# Patient Record
Sex: Male | Born: 1945 | Race: Black or African American | Hispanic: No | Marital: Married | Smoking: Former smoker
Health system: Southern US, Community
[De-identification: ages and names within clinical notes are randomized; demographics above are authoritative.]

## PROBLEM LIST (undated history)

## (undated) DIAGNOSIS — I428 Other cardiomyopathies: Secondary | ICD-10-CM

## (undated) DIAGNOSIS — K219 Gastro-esophageal reflux disease without esophagitis: Secondary | ICD-10-CM

## (undated) DIAGNOSIS — I10 Essential (primary) hypertension: Secondary | ICD-10-CM

## (undated) DIAGNOSIS — N186 End stage renal disease: Secondary | ICD-10-CM

## (undated) DIAGNOSIS — Z86718 Personal history of other venous thrombosis and embolism: Secondary | ICD-10-CM

## (undated) DIAGNOSIS — E785 Hyperlipidemia, unspecified: Secondary | ICD-10-CM

## (undated) DIAGNOSIS — Z9289 Personal history of other medical treatment: Secondary | ICD-10-CM

## (undated) DIAGNOSIS — I509 Heart failure, unspecified: Secondary | ICD-10-CM

## (undated) DIAGNOSIS — J45909 Unspecified asthma, uncomplicated: Secondary | ICD-10-CM

## (undated) DIAGNOSIS — C679 Malignant neoplasm of bladder, unspecified: Secondary | ICD-10-CM

## (undated) DIAGNOSIS — E119 Type 2 diabetes mellitus without complications: Secondary | ICD-10-CM

## (undated) DIAGNOSIS — Z9889 Other specified postprocedural states: Secondary | ICD-10-CM

## (undated) DIAGNOSIS — M1990 Unspecified osteoarthritis, unspecified site: Secondary | ICD-10-CM

## (undated) HISTORY — DX: Other cardiomyopathies: I42.8

## (undated) HISTORY — PX: JOINT REPLACEMENT: SHX530

## (undated) HISTORY — PX: BACK SURGERY: SHX140

## (undated) HISTORY — DX: Personal history of other medical treatment: Z92.89

## (undated) HISTORY — PX: OTHER SURGICAL HISTORY: SHX169

## (undated) HISTORY — DX: Other specified postprocedural states: Z98.890

## (undated) HISTORY — DX: Type 2 diabetes mellitus without complications: E11.9

## (undated) HISTORY — DX: End stage renal disease: N18.6

## (undated) HISTORY — DX: Hyperlipidemia, unspecified: E78.5

## (undated) HISTORY — DX: Essential (primary) hypertension: I10

## (undated) HISTORY — PX: CYSTOSCOPY: SUR368

## (undated) HISTORY — DX: Personal history of other venous thrombosis and embolism: Z86.718

## (undated) HISTORY — DX: Unspecified asthma, uncomplicated: J45.909

---

## 1998-01-22 ENCOUNTER — Other Ambulatory Visit: Admission: RE | Admit: 1998-01-22 | Discharge: 1998-01-22 | Payer: Self-pay | Admitting: Sports Medicine

## 1998-01-23 ENCOUNTER — Ambulatory Visit (HOSPITAL_COMMUNITY): Admission: RE | Admit: 1998-01-23 | Discharge: 1998-01-23 | Payer: Self-pay | Admitting: Sports Medicine

## 1998-02-13 ENCOUNTER — Ambulatory Visit (HOSPITAL_BASED_OUTPATIENT_CLINIC_OR_DEPARTMENT_OTHER): Admission: RE | Admit: 1998-02-13 | Discharge: 1998-02-13 | Payer: Self-pay | Admitting: Urology

## 1998-02-13 ENCOUNTER — Emergency Department (HOSPITAL_COMMUNITY): Admission: EM | Admit: 1998-02-13 | Discharge: 1998-02-13 | Payer: Self-pay | Admitting: Emergency Medicine

## 2001-04-28 ENCOUNTER — Encounter: Payer: Self-pay | Admitting: Cardiology

## 2001-04-28 ENCOUNTER — Ambulatory Visit (HOSPITAL_COMMUNITY): Admission: RE | Admit: 2001-04-28 | Discharge: 2001-04-28 | Payer: Self-pay | Admitting: Cardiology

## 2004-08-27 ENCOUNTER — Ambulatory Visit (HOSPITAL_COMMUNITY): Admission: RE | Admit: 2004-08-27 | Discharge: 2004-08-27 | Payer: Self-pay | Admitting: Internal Medicine

## 2004-11-19 ENCOUNTER — Ambulatory Visit (HOSPITAL_COMMUNITY): Admission: RE | Admit: 2004-11-19 | Discharge: 2004-11-19 | Payer: Self-pay | Admitting: Internal Medicine

## 2006-04-16 ENCOUNTER — Ambulatory Visit: Payer: Self-pay | Admitting: Oncology

## 2006-05-03 LAB — CBC & DIFF AND RETIC
Basophils Absolute: 0 10*3/uL (ref 0.0–0.1)
Eosinophils Absolute: 0.1 10*3/uL (ref 0.0–0.5)
HGB: 11.3 g/dL — ABNORMAL LOW (ref 13.0–17.1)
IRF: 0.38 (ref 0.070–0.380)
MCV: 91.4 fL (ref 81.6–98.0)
MONO#: 0.6 10*3/uL (ref 0.1–0.9)
NEUT#: 3.8 10*3/uL (ref 1.5–6.5)
RDW: 15.7 % — ABNORMAL HIGH (ref 11.2–14.6)
RETIC #: 48.5 10*3/uL (ref 31.8–103.9)
Retic %: 1.3 % (ref 0.7–2.3)
WBC: 5.6 10*3/uL (ref 4.0–10.0)
lymph#: 1 10*3/uL (ref 0.9–3.3)

## 2006-05-03 LAB — CHCC SMEAR

## 2006-05-04 LAB — COMPREHENSIVE METABOLIC PANEL
AST: 20 U/L (ref 0–37)
Albumin: 3.7 g/dL (ref 3.5–5.2)
Alkaline Phosphatase: 48 U/L (ref 39–117)
Potassium: 4.7 mEq/L (ref 3.5–5.3)
Sodium: 140 mEq/L (ref 135–145)
Total Bilirubin: 0.3 mg/dL (ref 0.3–1.2)
Total Protein: 6.2 g/dL (ref 6.0–8.3)

## 2006-05-04 LAB — IRON AND TIBC
%SAT: 24 % (ref 20–55)
Iron: 73 ug/dL (ref 42–165)
TIBC: 299 ug/dL (ref 215–435)

## 2006-05-04 LAB — FERRITIN: Ferritin: 199 ng/mL (ref 22–322)

## 2006-06-29 ENCOUNTER — Ambulatory Visit: Payer: Self-pay | Admitting: Oncology

## 2006-07-01 LAB — CBC WITH DIFFERENTIAL/PLATELET
Basophils Absolute: 0 10*3/uL (ref 0.0–0.1)
EOS%: 3.8 % (ref 0.0–7.0)
HCT: 34.4 % — ABNORMAL LOW (ref 38.7–49.9)
HGB: 11.3 g/dL — ABNORMAL LOW (ref 13.0–17.1)
LYMPH%: 24.3 % (ref 14.0–48.0)
MCH: 30.1 pg (ref 28.0–33.4)
MCV: 91.3 fL (ref 81.6–98.0)
MONO%: 11.4 % (ref 0.0–13.0)
NEUT%: 60 % (ref 40.0–75.0)
Platelets: 255 10*3/uL (ref 145–400)
lymph#: 1.3 10*3/uL (ref 0.9–3.3)

## 2006-07-01 LAB — COMPREHENSIVE METABOLIC PANEL
Albumin: 3.8 g/dL (ref 3.5–5.2)
Alkaline Phosphatase: 45 U/L (ref 39–117)
BUN: 34 mg/dL — ABNORMAL HIGH (ref 6–23)
CO2: 24 mEq/L (ref 19–32)
Calcium: 8.7 mg/dL (ref 8.4–10.5)
Glucose, Bld: 151 mg/dL — ABNORMAL HIGH (ref 70–99)
Potassium: 4.4 mEq/L (ref 3.5–5.3)

## 2006-12-27 ENCOUNTER — Ambulatory Visit: Payer: Self-pay | Admitting: Oncology

## 2006-12-30 LAB — CBC WITH DIFFERENTIAL/PLATELET
BASO%: 0.5 % (ref 0.0–2.0)
Eosinophils Absolute: 0.3 10*3/uL (ref 0.0–0.5)
HCT: 33.7 % — ABNORMAL LOW (ref 38.7–49.9)
LYMPH%: 23.4 % (ref 14.0–48.0)
MCHC: 34 g/dL (ref 32.0–35.9)
MCV: 89.8 fL (ref 81.6–98.0)
MONO#: 0.6 10*3/uL (ref 0.1–0.9)
MONO%: 11.3 % (ref 0.0–13.0)
NEUT%: 59.7 % (ref 40.0–75.0)
Platelets: 248 10*3/uL (ref 145–400)
RBC: 3.75 10*6/uL — ABNORMAL LOW (ref 4.20–5.71)
WBC: 5.7 10*3/uL (ref 4.0–10.0)

## 2006-12-30 LAB — COMPREHENSIVE METABOLIC PANEL
Alkaline Phosphatase: 63 U/L (ref 39–117)
CO2: 24 mEq/L (ref 19–32)
Creatinine, Ser: 1.98 mg/dL — ABNORMAL HIGH (ref 0.40–1.50)
Glucose, Bld: 263 mg/dL — ABNORMAL HIGH (ref 70–99)
Total Bilirubin: 0.3 mg/dL (ref 0.3–1.2)

## 2007-09-30 ENCOUNTER — Encounter: Admission: RE | Admit: 2007-09-30 | Discharge: 2007-09-30 | Payer: Self-pay | Admitting: Nephrology

## 2008-05-28 ENCOUNTER — Encounter: Admission: RE | Admit: 2008-05-28 | Discharge: 2008-05-28 | Payer: Self-pay | Admitting: Internal Medicine

## 2009-04-10 ENCOUNTER — Encounter: Admission: RE | Admit: 2009-04-10 | Discharge: 2009-04-10 | Payer: Self-pay | Admitting: Sports Medicine

## 2009-05-16 ENCOUNTER — Ambulatory Visit (HOSPITAL_BASED_OUTPATIENT_CLINIC_OR_DEPARTMENT_OTHER): Admission: RE | Admit: 2009-05-16 | Discharge: 2009-05-16 | Payer: Self-pay | Admitting: Orthopedic Surgery

## 2010-03-13 ENCOUNTER — Encounter: Admission: RE | Admit: 2010-03-13 | Discharge: 2010-03-13 | Payer: Self-pay | Admitting: Rheumatology

## 2010-04-22 ENCOUNTER — Encounter: Payer: Self-pay | Admitting: Cardiology

## 2010-05-27 ENCOUNTER — Ambulatory Visit: Payer: Self-pay | Admitting: Cardiology

## 2010-05-28 ENCOUNTER — Encounter: Payer: Self-pay | Admitting: Internal Medicine

## 2010-05-28 ENCOUNTER — Telehealth (INDEPENDENT_AMBULATORY_CARE_PROVIDER_SITE_OTHER): Payer: Self-pay | Admitting: *Deleted

## 2010-05-29 ENCOUNTER — Encounter (HOSPITAL_COMMUNITY): Admission: RE | Admit: 2010-05-29 | Discharge: 2010-07-08 | Payer: Self-pay | Admitting: Cardiology

## 2010-05-29 ENCOUNTER — Ambulatory Visit: Payer: Self-pay

## 2010-05-29 ENCOUNTER — Ambulatory Visit: Payer: Self-pay | Admitting: Internal Medicine

## 2010-05-30 ENCOUNTER — Telehealth (INDEPENDENT_AMBULATORY_CARE_PROVIDER_SITE_OTHER): Payer: Self-pay | Admitting: *Deleted

## 2010-06-04 ENCOUNTER — Inpatient Hospital Stay (HOSPITAL_COMMUNITY): Admission: RE | Admit: 2010-06-04 | Discharge: 2010-06-07 | Payer: Self-pay | Admitting: Orthopedic Surgery

## 2010-07-15 ENCOUNTER — Encounter: Admission: RE | Admit: 2010-07-15 | Discharge: 2010-07-15 | Payer: Self-pay | Admitting: Internal Medicine

## 2010-07-24 ENCOUNTER — Encounter: Admission: RE | Admit: 2010-07-24 | Discharge: 2010-07-24 | Payer: Self-pay | Admitting: Internal Medicine

## 2010-07-29 ENCOUNTER — Encounter (HOSPITAL_COMMUNITY)
Admission: RE | Admit: 2010-07-29 | Discharge: 2010-10-14 | Payer: Self-pay | Source: Home / Self Care | Attending: Nephrology | Admitting: Nephrology

## 2010-08-04 ENCOUNTER — Ambulatory Visit: Payer: Self-pay

## 2010-08-04 ENCOUNTER — Ambulatory Visit: Payer: Self-pay | Admitting: Internal Medicine

## 2010-08-04 ENCOUNTER — Encounter (INDEPENDENT_AMBULATORY_CARE_PROVIDER_SITE_OTHER): Payer: Self-pay | Admitting: Internal Medicine

## 2010-08-04 ENCOUNTER — Ambulatory Visit (HOSPITAL_COMMUNITY): Admission: RE | Admit: 2010-08-04 | Discharge: 2010-08-04 | Payer: Self-pay | Admitting: Internal Medicine

## 2010-08-27 ENCOUNTER — Telehealth (INDEPENDENT_AMBULATORY_CARE_PROVIDER_SITE_OTHER): Payer: Self-pay | Admitting: *Deleted

## 2010-10-15 ENCOUNTER — Encounter (HOSPITAL_COMMUNITY)
Admission: RE | Admit: 2010-10-15 | Discharge: 2010-11-11 | Payer: Self-pay | Source: Home / Self Care | Attending: Nephrology | Admitting: Nephrology

## 2010-10-29 LAB — POCT HEMOGLOBIN-HEMACUE: Hemoglobin: 11.1 g/dL — ABNORMAL LOW (ref 13.0–17.0)

## 2010-11-03 ENCOUNTER — Encounter: Payer: Self-pay | Admitting: Sports Medicine

## 2010-11-11 LAB — POCT HEMOGLOBIN-HEMACUE: Hemoglobin: 11.3 g/dL — ABNORMAL LOW (ref 13.0–17.0)

## 2010-11-11 NOTE — Progress Notes (Signed)
Summary: Nuc Pre-Procedure  Phone Note Outgoing Call Call back at [facility] Phone [phone]   Call placed by: Antionette Char RN,  May 28, 2010 3:56 PM Call placed to: Patient Reason for Call: Confirm/change Appt Summary of Call: Reviewed information on Myoview Information Sheet (see scanned document for further details).  Spoke with patient.

## 2010-11-11 NOTE — Assessment & Plan Note (Signed)
Summary: np6/surgical clearance/total knee & hip surgery   Primary Provider:  Dr. Nehemiah Settle  CC:  no complaints.....Marland Kitchensurgical clearance.  History of Present Illness: 65 yo with history of DM2, HTN, CKD presents for preoperative cardiac evaluation.  He needs to undergo left THR.  He has had chronic left hip and back pain requiring the use of narcotics.  He has been walking with a crutch because of the hip pain, which has been present since Christmas.  His activity level is very impaired by the pain. He denies exertional shortness of breath or chest pain but exertion has been minimal for the last few months.  BP is under good control.    ECG: NSR with PVCs  Labs (7/11): K 4.3, creatinine 2.28, LDL 118, HDL 33, LFTs normal  Current Medications (verified): 1)  Amlodipine Besy-Benazepril Hcl 10-20 Mg Caps (Amlodipine Besy-Benazepril Hcl) .Marland Kitchen.. 1 Tab By Mouth Once Daily 2)  Indapamide 1.25 Mg Tabs (Indapamide) .Marland Kitchen.. 1 Tab By Mouth Once Daily 3)  Doxazosin Mesylate 2 Mg Tabs (Doxazosin Mesylate) .Marland Kitchen.. 1 Tab By Mouth Once Daily 4)  Glipizide 10 Mg Tabs (Glipizide) .Marland Kitchen.. 1 Tab By Mouth Once Daily 5)  Avandia 8 Mg Tabs (Rosiglitazone Maleate) .Marland Kitchen.. 1 Tab By Mouth Once Daily 6)  Januvia 50 Mg Tabs (Sitagliptin Phosphate) .Marland Kitchen.. 1  Tab By Mouth Once Daily 7)  Allopurinol 300 Mg Tabs (Allopurinol) .... 1/2 Tab By Mouth Once Daily 8)  Furosemide 40 Mg Tabs (Furosemide) .... Take One Tablet By Mouth Daily. 9)  Oxycodone-Acetaminophen 5-325 Mg Tabs (Oxycodone-Acetaminophen) .... As Needed 10)  Aspirin 81 Mg Tbec (Aspirin) .... Take One Tablet By Mouth Daily 11)  Ferrous Sulfate 325 (65 Fe) Mg  Tabs (Ferrous Sulfate) .Marland Kitchen.. 1 Tab By Mouth Once Daily 12)  Red Yeast Rice 600 Mg Caps (Red Yeast Rice Extract) .Marland Kitchen.. 1  Tab By Mouth Once Daily  Past History:  Past Medical History: 1.  CAD: LHC 2002 showed mild nonobstructive disease with 25% stenosis in LAD.  EF 55%.   2.  DM2 3.  Obesity 4.  CKD 5.  HTN 6.   Gout 7.  Low back pain 8.  Osteoarthritis of the hips  Family History: Father died from "heart complications" at 44  Social History: Nonsmoker (quit 1998).  Retired Production designer, theatre/television/film at [employer].  Lives with wife and daughter in [place].   Review of Systems       All systems reviewed and negative except as per HPI.   Vital Signs:  Patient profile:   65 year old male Height:      70 inches Weight:      274 pounds BMI:     39.46 Pulse rate:   85 / minute Resp:     14 per minute BP sitting:   127 / 76  (left arm)  Vitals Entered By: Kem Parkinson (May 27, 2010 9:58 AM)  Physical Exam  General:  Well developed, well nourished, in no acute distress. Obese.  Head:  normocephalic and atraumatic Nose:  no deformity, discharge, inflammation, or lesions Mouth:  Teeth, gums and palate normal. Oral mucosa normal. Neck:  Neck supple, no JVD. No masses, thyromegaly or abnormal cervical nodes. Lungs:  Clear bilaterally to auscultation and percussion. Heart:  Non-displaced PMI, chest non-tender; regular rate and rhythm, S1, S2 without murmurs, rubs or gallops. Carotid upstroke normal, no bruit. Pedals normal pulses. No edema, no varicosities. Abdomen:  Bowel sounds positive; abdomen soft and non-tender without masses, organomegaly, or hernias noted.  No hepatosplenomegaly. Msk:  Antalgic gait Extremities:  No clubbing or cyanosis. Neurologic:  Alert and oriented x 3. Skin:  Intact without lesions or rashes. Psych:  Normal affect.   Impression & Recommendations:  Problem # 1:  PREOPERATIVE CARDIAC EVALUATION Patient has a number of risk factors for CAD (HTN, DM, CKD), and he had mild disease on cath in 2002.  Activity is very limited by hip pain (able to perform < 4 METS).  Given his activity limitation, I will arrange for him to get a Steffanie Dunn for risk stratification prior to surgery.  If he has evidence for obstructive coronary disease by myoview, I would suggest beginning a  beta blocker.   Problem # 2:  HYPERLIPIDEMIA-MIXED (ICD-272.4) Given diabetes, I would aim for LDL at least < 100.  He recently started red yeast rice extract, so need lipids/LFTs in a couple of months.    Other Orders: Nuclear Stress Test (Nuc Stress Test)  Patient Instructions: 1)  Your physician has requested that you have an lexiscan  myoview.  For further information please visit [email or web address]  Please follow instruction sheet, as given. PT REQUESTS THIS THURSDAY  2)  Your physician recommends that you schedule a follow-up appointment as needed with Dr Shirlee Latch.

## 2010-11-11 NOTE — Progress Notes (Signed)
  Stress faxed to [facility] [phone] [facility]  May 30, 2010 9:18 AM

## 2010-11-11 NOTE — Assessment & Plan Note (Signed)
Summary: Cardiology Nuclear Testing  Nuclear Med Background Indications for Stress Test: Evaluation for Ischemia, Surgical Clearance  Indications Comments: Pending (L)THR on 06/04/10 by Dr. Ollen Gross  History: Asthma, Heart Catheterization, Myocardial Perfusion Study  History Comments: '02 MPS: Inferior Ischemic, EF 52%; '02 Cath:LAD 25%, EF 55%; h/o DVT  Symptoms: Rapid HR  Symptoms Comments: History PVC's   Nuclear Pre-Procedure Cardiac Risk Factors: Family History - CAD, History of Smoking, Hypertension, Lipids, NIDDM, Obesity Caffeine/Decaff Intake: None NPO After: 6:00 AM Lungs: Clear.  O2 Sat 95% on RA. IV 0.9% NS with Angio Cath: 22g     IV Site: Rt Hand IV Started by: Bonnita Levan RN Chest Size (in) 52     Height (in): 70 Weight (lb): 272 BMI: 39.17  Nuclear Med Study 1 or 2 day study:  1 day     Stress Test Type:  Eugenie Birks Reading MD:  Arvilla Meres, MD     Referring MD:  Marca Ancona, MD Resting Radionuclide:  Technetium 62m Tetrofosmin     Resting Radionuclide Dose:  11 mCi  Stress Radionuclide:  Technetium 78m Tetrofosmin     Stress Radionuclide Dose:  33 mCi   Stress Protocol   Lexiscan: 0.4 mg   Stress Test Technologist:  Rea College CMA-N     Nuclear Technologist:  Domenic Polite CNMT  Rest Procedure  Myocardial perfusion imaging was performed at rest 45 minutes following the intravenous administration of Myoview Technetium 49m Tetrofosmin.  Stress Procedure  The patient received IV Lexiscan 0.4 mg over 15-seconds.  Myoview injected at 30-seconds.  There were no significant changes with infusion, only occasional PVC's and rare PAC's.  Quantitative spect images were obtained after a 45 minute delay.  QPS Raw Data Images:  Normal; no motion artifact; normal heart/lung ratio. Stress Images:  Decreased uptake in the infero-apical wall.  Rest Images:  Decreased uptake in the infero-apical wall.  Subtraction (SDS):  Mild fixed infero-apical defect.    Transient Ischemic Dilatation:  1.09  (Normal <1.22)  Lung/Heart Ratio:  .34  (Normal <0.45)  Quantitative Gated Spect Images QGS EDV:  184 ml QGS ESV:  97 ml QGS EF:  47 % QGS cine images:  Mild apical hypokinesis.   Findings Low risk nuclear study      Overall Impression  Exercise Capacity: Lexiscan study with no exercise. ECG Impression: Baseline: NSR; No significant ST segment change with Lexiscan. Overall Impression: Low risk stress nuclear study. Overall Impression Comments: Very small fixed infero-apical defect consistent with apical thinning vs. previous infarction. No ischemia.  Appended Document: Cardiology Nuclear Testing Fixed inferoapical defect, no ischemia.  May be similar to 2002 study when cath showed minimal disease.  OK for surgery.  EF mildly decreased.  Would confirm with echo.    Appended Document: Cardiology Nuclear Testing Cannot rule out CAD (possible fixed defect but no further workup needed before surgery.  Would start low dose beta blocker, can use Coreg 6.25 mg two times a day for cardioprotection.   Appended Document: Cardiology Nuclear Testing 06/02/10--12noon--called pt to let him know about clearance and starting coreg--LMTCB--nt  Appended Document: Cardiology Nuclear Testing 06/02/10--130pm--spoke with pt and gave results stress test, also told pt to start coreg 6.25mg  two times a day --will fax surgical clearance to dr alusio--surgery sched for wednesday 8/24--nt

## 2010-11-11 NOTE — Progress Notes (Signed)
Summary: Records Request  Faxed Echo to [facility] at [facility] ([phone]). Debby Freiberg  August 27, 2010 6:03 PM

## 2010-11-11 NOTE — Letter (Signed)
Summary: [facility] Physicians Office Visit Note   [facility] Physicians Office Visit Note   Imported By: Roderic Ovens 05/30/2010 10:52:56  _____________________________________________________________________  External Attachment:    Type:   Image     Comment:   External Document

## 2010-11-25 ENCOUNTER — Other Ambulatory Visit: Payer: Self-pay | Admitting: Nephrology

## 2010-11-25 ENCOUNTER — Encounter (HOSPITAL_COMMUNITY)
Admission: RE | Admit: 2010-11-25 | Discharge: 2010-11-25 | Disposition: A | Discharge status: Home / Self Care | Payer: Medicare Other | Source: Ambulatory Visit | Attending: Nephrology | Admitting: Nephrology

## 2010-11-25 ENCOUNTER — Other Ambulatory Visit: Payer: Self-pay

## 2010-11-25 DIAGNOSIS — D638 Anemia in other chronic diseases classified elsewhere: Secondary | ICD-10-CM | POA: Insufficient documentation

## 2010-11-25 DIAGNOSIS — N183 Chronic kidney disease, stage 3 unspecified: Secondary | ICD-10-CM | POA: Insufficient documentation

## 2010-11-25 LAB — IRON AND TIBC
Iron: 138 ug/dL — ABNORMAL HIGH (ref 42–135)
Saturation Ratios: 50 % (ref 20–55)
TIBC: 278 ug/dL (ref 215–435)
UIBC: 140 ug/dL

## 2010-11-25 LAB — POCT HEMOGLOBIN-HEMACUE: Hemoglobin: 11.2 g/dL — ABNORMAL LOW (ref 13.0–17.0)

## 2010-12-09 ENCOUNTER — Encounter (HOSPITAL_COMMUNITY): Payer: Medicare Other

## 2010-12-09 ENCOUNTER — Other Ambulatory Visit: Payer: Self-pay

## 2010-12-09 ENCOUNTER — Other Ambulatory Visit: Payer: Self-pay | Admitting: Nephrology

## 2010-12-09 LAB — PROTEIN / CREATININE RATIO, URINE
Creatinine, Urine: 57.3 mg/dL
Protein Creatinine Ratio: 0.56 — ABNORMAL HIGH (ref 0.00–0.15)
Total Protein, Urine: 32 mg/dL

## 2010-12-09 LAB — RENAL FUNCTION PANEL
Albumin: 3.5 g/dL (ref 3.5–5.2)
BUN: 41 mg/dL — ABNORMAL HIGH (ref 6–23)
CO2: 28 mEq/L (ref 19–32)
Calcium: 8.8 mg/dL (ref 8.4–10.5)
Chloride: 106 mEq/L (ref 96–112)
Creatinine, Ser: 1.75 mg/dL — ABNORMAL HIGH (ref 0.4–1.5)
GFR calc Af Amer: 48 mL/min — ABNORMAL LOW (ref >60)
GFR calc non Af Amer: 39 mL/min — ABNORMAL LOW (ref >60)
Glucose, Bld: 189 mg/dL — ABNORMAL HIGH (ref 70–99)
Phosphorus: 3.8 mg/dL (ref 2.3–4.6)
Potassium: 4.4 mEq/L (ref 3.5–5.1)
Sodium: 142 mEq/L (ref 135–145)

## 2010-12-10 LAB — PTH, INTACT AND CALCIUM
Calcium, Total (PTH): 8.6 mg/dL (ref 8.4–10.5)
PTH: 226.9 pg/mL — ABNORMAL HIGH (ref 14.0–72.0)

## 2010-12-10 LAB — POCT HEMOGLOBIN-HEMACUE: Hemoglobin: 11.8 g/dL — ABNORMAL LOW (ref 13.0–17.0)

## 2010-12-22 LAB — POCT HEMOGLOBIN-HEMACUE: Hemoglobin: 10.2 g/dL — ABNORMAL LOW (ref 13.0–17.0)

## 2010-12-23 ENCOUNTER — Encounter (HOSPITAL_COMMUNITY): Payer: BC Managed Care – PPO

## 2010-12-25 ENCOUNTER — Other Ambulatory Visit: Payer: Self-pay | Admitting: Nephrology

## 2010-12-25 ENCOUNTER — Other Ambulatory Visit: Payer: Self-pay

## 2010-12-25 ENCOUNTER — Encounter (HOSPITAL_COMMUNITY): Payer: Medicare Other | Attending: Nephrology

## 2010-12-25 DIAGNOSIS — D638 Anemia in other chronic diseases classified elsewhere: Secondary | ICD-10-CM | POA: Insufficient documentation

## 2010-12-25 DIAGNOSIS — N183 Chronic kidney disease, stage 3 unspecified: Secondary | ICD-10-CM | POA: Insufficient documentation

## 2010-12-25 LAB — IRON AND TIBC
Iron: 134 ug/dL (ref 42–135)
Saturation Ratios: 49 % (ref 20–55)
TIBC: 275 ug/dL (ref 215–435)
UIBC: 141 ug/dL

## 2010-12-26 LAB — CBC
HCT: 30 % — ABNORMAL LOW (ref 39.0–52.0)
Hemoglobin: 8.5 g/dL — ABNORMAL LOW (ref 13.0–17.0)
Hemoglobin: 9.9 g/dL — ABNORMAL LOW (ref 13.0–17.0)
MCH: 30 pg (ref 26.0–34.0)
MCH: 30.2 pg (ref 26.0–34.0)
MCH: 30.2 pg (ref 26.0–34.0)
MCH: 30.5 pg (ref 26.0–34.0)
MCHC: 32.9 g/dL (ref 30.0–36.0)
MCHC: 33 g/dL (ref 30.0–36.0)
MCHC: 33.1 g/dL (ref 30.0–36.0)
MCHC: 33.1 g/dL (ref 30.0–36.0)
MCV: 90.7 fL (ref 78.0–100.0)
MCV: 91.4 fL (ref 78.0–100.0)
MCV: 91.8 fL (ref 78.0–100.0)
Platelets: 153 10*3/uL (ref 150–400)
Platelets: 155 10*3/uL (ref 150–400)
Platelets: 156 10*3/uL (ref 150–400)
Platelets: 221 10*3/uL (ref 150–400)
RBC: 2.82 MIL/uL — ABNORMAL LOW (ref 4.22–5.81)
RBC: 2.98 MIL/uL — ABNORMAL LOW (ref 4.22–5.81)
RBC: 3.3 MIL/uL — ABNORMAL LOW (ref 4.22–5.81)
RDW: 15.9 % — ABNORMAL HIGH (ref 11.5–15.5)
RDW: 16.3 % — ABNORMAL HIGH (ref 11.5–15.5)
WBC: 4.7 10*3/uL (ref 4.0–10.5)

## 2010-12-26 LAB — GLUCOSE, CAPILLARY
Glucose-Capillary: 102 mg/dL — ABNORMAL HIGH (ref 70–99)
Glucose-Capillary: 113 mg/dL — ABNORMAL HIGH (ref 70–99)
Glucose-Capillary: 115 mg/dL — ABNORMAL HIGH (ref 70–99)
Glucose-Capillary: 115 mg/dL — ABNORMAL HIGH (ref 70–99)
Glucose-Capillary: 116 mg/dL — ABNORMAL HIGH (ref 70–99)
Glucose-Capillary: 120 mg/dL — ABNORMAL HIGH (ref 70–99)
Glucose-Capillary: 78 mg/dL (ref 70–99)
Glucose-Capillary: 86 mg/dL (ref 70–99)
Glucose-Capillary: 87 mg/dL (ref 70–99)

## 2010-12-26 LAB — COMPREHENSIVE METABOLIC PANEL
ALT: 13 U/L (ref 0–53)
AST: 19 U/L (ref 0–37)
Albumin: 3.4 g/dL — ABNORMAL LOW (ref 3.5–5.2)
Alkaline Phosphatase: 40 U/L (ref 39–117)
BUN: 42 mg/dL — ABNORMAL HIGH (ref 6–23)
CO2: 26 mEq/L (ref 19–32)
Calcium: 8.7 mg/dL (ref 8.4–10.5)
Chloride: 107 mEq/L (ref 96–112)
Creatinine, Ser: 2.23 mg/dL — ABNORMAL HIGH (ref 0.4–1.5)
GFR calc Af Amer: 36 mL/min — ABNORMAL LOW (ref >60)
GFR calc non Af Amer: 30 mL/min — ABNORMAL LOW (ref >60)
Glucose, Bld: 134 mg/dL — ABNORMAL HIGH (ref 70–99)
Potassium: 4.2 mEq/L (ref 3.5–5.1)
Sodium: 140 mEq/L (ref 135–145)
Total Bilirubin: 0.7 mg/dL (ref 0.3–1.2)
Total Protein: 6.3 g/dL (ref 6.0–8.3)

## 2010-12-26 LAB — BASIC METABOLIC PANEL
BUN: 32 mg/dL — ABNORMAL HIGH (ref 6–23)
BUN: 37 mg/dL — ABNORMAL HIGH (ref 6–23)
CO2: 25 mEq/L (ref 19–32)
CO2: 26 mEq/L (ref 19–32)
Calcium: 8 mg/dL — ABNORMAL LOW (ref 8.4–10.5)
Calcium: 8.1 mg/dL — ABNORMAL LOW (ref 8.4–10.5)
Chloride: 107 mEq/L (ref 96–112)
Creatinine, Ser: 1.79 mg/dL — ABNORMAL HIGH (ref 0.4–1.5)
Creatinine, Ser: 2.29 mg/dL — ABNORMAL HIGH (ref 0.4–1.5)
GFR calc Af Amer: 46 mL/min — ABNORMAL LOW (ref >60)
Glucose, Bld: 50 mg/dL — ABNORMAL LOW (ref 70–99)

## 2010-12-26 LAB — TYPE AND SCREEN

## 2010-12-26 LAB — URINALYSIS, ROUTINE W REFLEX MICROSCOPIC
Ketones, ur: NEGATIVE mg/dL
Nitrite: NEGATIVE
Protein, ur: NEGATIVE mg/dL

## 2010-12-26 LAB — PROTIME-INR
INR: 1.15 (ref 0.00–1.49)
Prothrombin Time: 14.9 seconds (ref 11.6–15.2)
Prothrombin Time: 16.2 seconds — ABNORMAL HIGH (ref 11.6–15.2)
Prothrombin Time: 17.4 seconds — ABNORMAL HIGH (ref 11.6–15.2)
Prothrombin Time: 18.8 seconds — ABNORMAL HIGH (ref 11.6–15.2)

## 2010-12-26 LAB — SURGICAL PCR SCREEN: MRSA, PCR: NEGATIVE

## 2010-12-26 LAB — POCT HEMOGLOBIN-HEMACUE: Hemoglobin: 12.7 g/dL — ABNORMAL LOW (ref 13.0–17.0)

## 2010-12-26 LAB — APTT: aPTT: 35 seconds (ref 24–37)

## 2011-01-08 ENCOUNTER — Encounter (HOSPITAL_COMMUNITY): Payer: BC Managed Care – PPO

## 2011-01-17 LAB — GLUCOSE, CAPILLARY: Glucose-Capillary: 140 mg/dL — ABNORMAL HIGH (ref 70–99)

## 2011-01-17 LAB — BASIC METABOLIC PANEL
CO2: 26 mEq/L (ref 19–32)
Chloride: 105 mEq/L (ref 96–112)
Creatinine, Ser: 2.1 mg/dL — ABNORMAL HIGH (ref 0.4–1.5)
GFR calc Af Amer: 39 mL/min — ABNORMAL LOW (ref >60)
Potassium: 4 mEq/L (ref 3.5–5.1)

## 2011-01-17 LAB — POCT HEMOGLOBIN-HEMACUE: Hemoglobin: 10.6 g/dL — ABNORMAL LOW (ref 13.0–17.0)

## 2011-01-22 ENCOUNTER — Other Ambulatory Visit: Payer: Self-pay | Admitting: Nephrology

## 2011-01-22 ENCOUNTER — Encounter (HOSPITAL_COMMUNITY): Payer: Medicare Other | Attending: Nephrology

## 2011-01-22 DIAGNOSIS — N183 Chronic kidney disease, stage 3 unspecified: Secondary | ICD-10-CM | POA: Insufficient documentation

## 2011-01-22 DIAGNOSIS — D638 Anemia in other chronic diseases classified elsewhere: Secondary | ICD-10-CM | POA: Insufficient documentation

## 2011-01-22 LAB — BASIC METABOLIC PANEL
CO2: 25 mEq/L (ref 19–32)
Calcium: 8.6 mg/dL (ref 8.4–10.5)
Creatinine, Ser: 2 mg/dL — ABNORMAL HIGH (ref 0.4–1.5)
GFR calc Af Amer: 41 mL/min — ABNORMAL LOW (ref >60)
GFR calc non Af Amer: 34 mL/min — ABNORMAL LOW (ref >60)
Sodium: 140 mEq/L (ref 135–145)

## 2011-01-22 LAB — FERRITIN: Ferritin: 390 ng/mL — ABNORMAL HIGH (ref 22–322)

## 2011-01-22 LAB — IRON AND TIBC
Iron: 118 ug/dL (ref 42–135)
TIBC: 262 ug/dL (ref 215–435)

## 2011-01-22 LAB — POCT HEMOGLOBIN-HEMACUE: Hemoglobin: 12 g/dL — ABNORMAL LOW (ref 13.0–17.0)

## 2011-02-05 ENCOUNTER — Other Ambulatory Visit: Payer: Self-pay | Admitting: Nephrology

## 2011-02-05 ENCOUNTER — Encounter (HOSPITAL_COMMUNITY): Payer: Medicare Other

## 2011-02-06 LAB — POCT HEMOGLOBIN-HEMACUE: Hemoglobin: 11.5 g/dL — ABNORMAL LOW (ref 13.0–17.0)

## 2011-02-19 ENCOUNTER — Other Ambulatory Visit: Payer: Self-pay | Admitting: Nephrology

## 2011-02-19 ENCOUNTER — Encounter (HOSPITAL_COMMUNITY): Payer: Medicare Other | Attending: Nephrology

## 2011-02-19 DIAGNOSIS — N183 Chronic kidney disease, stage 3 unspecified: Secondary | ICD-10-CM | POA: Insufficient documentation

## 2011-02-19 DIAGNOSIS — D638 Anemia in other chronic diseases classified elsewhere: Secondary | ICD-10-CM | POA: Insufficient documentation

## 2011-02-19 LAB — RENAL FUNCTION PANEL
Albumin: 3.3 g/dL — ABNORMAL LOW (ref 3.5–5.2)
BUN: 54 mg/dL — ABNORMAL HIGH (ref 6–23)
CO2: 27 mEq/L (ref 19–32)
Chloride: 102 mEq/L (ref 96–112)
Creatinine, Ser: 2.38 mg/dL — ABNORMAL HIGH (ref 0.4–1.5)
Glucose, Bld: 144 mg/dL — ABNORMAL HIGH (ref 70–99)
Potassium: 3.9 mEq/L (ref 3.5–5.1)

## 2011-02-19 LAB — IRON AND TIBC
Saturation Ratios: 38 % (ref 20–55)
TIBC: 255 ug/dL (ref 215–435)
UIBC: 157 ug/dL

## 2011-02-20 LAB — PTH, INTACT AND CALCIUM: PTH: 302.3 pg/mL — ABNORMAL HIGH (ref 14.0–72.0)

## 2011-02-20 LAB — POCT HEMOGLOBIN-HEMACUE: Hemoglobin: 11.5 g/dL — ABNORMAL LOW (ref 13.0–17.0)

## 2011-02-24 NOTE — Op Note (Signed)
NAME:  Albert Stanley, Albert Stanley             ACCOUNT NO.:  [id number]   MEDICAL RECORD NO.:  [id number]          PATIENT TYPE:  AMB   LOCATION:  DSC                          FACILITY:  [facility]   PHYSICIAN:  Loreta Ave, M.D. DATE OF BIRTH:  [birth year]   DATE OF PROCEDURE:  05/16/2009  DATE OF DISCHARGE:                               OPERATIVE REPORT   PREOPERATIVE DIAGNOSIS:  Left knee degenerative arthritis, lateral  meniscus tear.   POSTOPERATIVE DIAGNOSES:  Left knee degenerative arthritis, lateral  meniscus tear with grade 3 and 4 changes patellofemoral joint, grade 3  lateral tibial plateau.  Marked complex irreparable tearing of lateral  meniscus.   PROCEDURE:  Left knee exam under anesthesia, arthroscopy, debridement of  lateral meniscus.  Chondroplasty of lateral compartment and  patellofemoral joint.   SURGEON:  Loreta Ave, MD   ASSISTANT:  Genene Churn. Barry Dienes, [place]   ANESTHESIA:  Knee block with sedation.   SPECIMENS:  None.   CULTURES:  None.   COMPLICATIONS:  None.   DRESSING:  Soft compressive.   PROCEDURE:  The patient was brought to the operating room and after  adequate anesthesia had been obtained, knee examined.  Good motion, good  stability.  Reasonable patellofemoral tracking.  Tourniquet and leg  holder applied.  Leg prepped and draped in usual sterile fashion.  Three  portals created, one superolateral, one each medial and lateral  parapatellar.  Inflow catheter introduced, knee distended, arthroscope  was introduced, and knee inspected.  Extensive grade 3 and 4 changes  throughout the entire patellofemoral joint, patella, and trochlea.  Chondroplasty of all loose fragments, loose fragments removed.  Reasonable tracking.  Still some remaining cartilage over most of that  compartment.  Medial meniscus and medial compartment looked relatively  good, minimal changes, no tears.  Cruciate ligaments intact.  Lateral  meniscus, numerous complex tears with the  back half flip behind the  cruciate was found to have flipped in front of cruciate.  All unstable  fragments removed.  This only left about half of the front half and  middle third remaining.  Entire compartment debrided.  Diffuse grade 3  changes on the plateau, but the condyle did not look as bad.  Really  nothing grade 4 that need to be treated with  microfracturing.  All recess examined to be sure all loose fragments  removed.  Instruments and fluid removed.  Portals and knee injected with  Marcaine.  Portals closed with 4-0 nylon.  Sterile compressive dressing  applied.  Anesthesia reversed.  Brought to the room.  Tolerated surgery  well.  No complications.      Loreta Ave, M.D.  Electronically Signed     DFM/MEDQ  D:  05/16/2009  T:  05/17/2009  Job:  [id number]

## 2011-02-27 NOTE — Cardiovascular Report (Signed)
[place]  Patient:    Albert Stanley, Albert Stanley                      MRN: [id number] Proc. Date: 04/28/01 Attending:  Rollene Rotunda, M.D. [facility] CC:         Estell Harpin, M.D.   Cardiac Catheterization  DATE OF BIRTH:  May 26, 1946  PRIMARY PHYSICIAN:  Estell Harpin, M.D.  PROCEDURE:  Left heart cardiac catheterization/coronary arteriography.  INDICATIONS:  Evaluate patient with multiple cardiovascular risk factors and a stress perfusion study suggesting inferior ischemia with mild LV reduction.  DESCRIPTION OF PROCEDURE:  Left heart catheterization was performed via the right femoral artery.  The artery was cannulated using an anterior wall puncture.  A #6 French arterial sheath was inserted via the modified Seldinger technique.  Preformed Judkins and a pigtail catheter were utilized.  The patient tolerated the procedure well and left the lab in stable condition.  RESULTS:  HEMODYNAMICS:  LV 111/13, AO 111/68.  CORONARY ARTERIOGRAPHY:  Left main:  The left main coronary artery was normal.  Left anterior descending:  The LAD had a mid 25% stenosis.  Circumflex:  The circumflex was normal.  Right coronary artery:  The right coronary artery was dominant and normal.  LEFT VENTRICULOGRAM:  The left ventriculogram was obtained in the RAO projection.  The EF was 55% with normal wall motion.  CONCLUSIONS:  Minimal coronary artery plaquing.  No significant LV dysfunction.  PLAN:  The patient will continue to have primary risk factor modification.  No further cardiovascular testing is suggested at this point. DD:  04/28/01 TD:  04/28/01 Job: [id number]

## 2011-03-05 ENCOUNTER — Encounter (HOSPITAL_COMMUNITY): Payer: Medicare Other

## 2011-03-05 ENCOUNTER — Other Ambulatory Visit: Payer: Self-pay | Admitting: Nephrology

## 2011-04-02 ENCOUNTER — Other Ambulatory Visit: Payer: Self-pay | Admitting: Nephrology

## 2011-04-02 ENCOUNTER — Encounter (HOSPITAL_COMMUNITY): Payer: Medicare Other | Attending: Nephrology

## 2011-04-02 DIAGNOSIS — D638 Anemia in other chronic diseases classified elsewhere: Secondary | ICD-10-CM | POA: Insufficient documentation

## 2011-04-02 DIAGNOSIS — N183 Chronic kidney disease, stage 3 unspecified: Secondary | ICD-10-CM | POA: Insufficient documentation

## 2011-04-02 LAB — POCT HEMOGLOBIN-HEMACUE: Hemoglobin: 11.9 g/dL — ABNORMAL LOW (ref 13.0–17.0)

## 2011-04-02 LAB — PHOSPHORUS: Phosphorus: 4.9 mg/dL — ABNORMAL HIGH (ref 2.3–4.6)

## 2011-04-02 LAB — IRON AND TIBC
Saturation Ratios: 40 % (ref 20–55)
TIBC: 282 ug/dL (ref 215–435)
UIBC: 170 ug/dL

## 2011-04-14 ENCOUNTER — Other Ambulatory Visit: Payer: Self-pay | Admitting: Nephrology

## 2011-04-14 ENCOUNTER — Encounter (HOSPITAL_COMMUNITY)
Admission: RE | Admit: 2011-04-14 | Discharge: 2011-04-14 | Disposition: A | Discharge status: Home / Self Care | Payer: Medicare Other | Source: Ambulatory Visit | Attending: Nephrology | Admitting: Nephrology

## 2011-04-14 DIAGNOSIS — D638 Anemia in other chronic diseases classified elsewhere: Secondary | ICD-10-CM | POA: Insufficient documentation

## 2011-04-14 DIAGNOSIS — N183 Chronic kidney disease, stage 3 unspecified: Secondary | ICD-10-CM | POA: Insufficient documentation

## 2011-04-14 LAB — RENAL FUNCTION PANEL
BUN: 55 mg/dL — ABNORMAL HIGH (ref 6–23)
CO2: 24 mEq/L (ref 19–32)
Calcium: 8.3 mg/dL — ABNORMAL LOW (ref 8.4–10.5)
GFR calc Af Amer: 40 mL/min — ABNORMAL LOW (ref >60)
Glucose, Bld: 145 mg/dL — ABNORMAL HIGH (ref 70–99)
Potassium: 4.2 mEq/L (ref 3.5–5.1)
Sodium: 137 mEq/L (ref 135–145)

## 2011-05-12 ENCOUNTER — Other Ambulatory Visit: Payer: Self-pay | Admitting: Nephrology

## 2011-05-12 ENCOUNTER — Encounter (HOSPITAL_COMMUNITY): Payer: Medicare Other

## 2011-05-12 LAB — IRON AND TIBC
Saturation Ratios: 41 % (ref 20–55)
TIBC: 282 ug/dL (ref 215–435)
UIBC: 167 ug/dL

## 2011-05-12 LAB — POCT HEMOGLOBIN-HEMACUE: Hemoglobin: 11.5 g/dL — ABNORMAL LOW (ref 13.0–17.0)

## 2011-05-26 ENCOUNTER — Encounter (HOSPITAL_COMMUNITY): Payer: Medicare Other | Attending: Nephrology

## 2011-05-26 ENCOUNTER — Other Ambulatory Visit: Payer: Self-pay | Admitting: Anesthesiology

## 2011-05-26 DIAGNOSIS — N183 Chronic kidney disease, stage 3 unspecified: Secondary | ICD-10-CM | POA: Insufficient documentation

## 2011-05-26 DIAGNOSIS — D638 Anemia in other chronic diseases classified elsewhere: Secondary | ICD-10-CM | POA: Insufficient documentation

## 2011-05-26 LAB — RENAL FUNCTION PANEL
CO2: 26 mEq/L (ref 19–32)
Calcium: 8.5 mg/dL (ref 8.4–10.5)
Creatinine, Ser: 2.14 mg/dL — ABNORMAL HIGH (ref 0.50–1.35)
Glucose, Bld: 119 mg/dL — ABNORMAL HIGH (ref 70–99)

## 2011-06-10 ENCOUNTER — Other Ambulatory Visit: Payer: Self-pay | Admitting: Cardiology

## 2011-06-23 ENCOUNTER — Other Ambulatory Visit: Payer: Self-pay | Admitting: Nephrology

## 2011-06-23 ENCOUNTER — Encounter (HOSPITAL_COMMUNITY): Payer: Medicare Other | Attending: Nephrology

## 2011-06-23 DIAGNOSIS — N183 Chronic kidney disease, stage 3 unspecified: Secondary | ICD-10-CM | POA: Insufficient documentation

## 2011-06-23 DIAGNOSIS — D638 Anemia in other chronic diseases classified elsewhere: Secondary | ICD-10-CM | POA: Insufficient documentation

## 2011-06-23 LAB — FERRITIN: Ferritin: 443 ng/mL — ABNORMAL HIGH (ref 22–322)

## 2011-06-23 LAB — IRON AND TIBC
Iron: 104 ug/dL (ref 42–135)
UIBC: 151 ug/dL (ref 125–400)

## 2011-06-24 ENCOUNTER — Other Ambulatory Visit: Payer: Self-pay | Admitting: *Deleted

## 2011-06-24 MED ORDER — CARVEDILOL 6.25 MG PO TABS: 6.2500 mg | ORAL_TABLET | Freq: Two times a day (BID) | ORAL | Status: DC

## 2011-07-07 ENCOUNTER — Other Ambulatory Visit: Payer: Self-pay | Admitting: Nephrology

## 2011-07-07 ENCOUNTER — Encounter (HOSPITAL_COMMUNITY): Payer: Medicare Other

## 2011-07-07 LAB — RENAL FUNCTION PANEL
Albumin: 3.4 g/dL — ABNORMAL LOW (ref 3.5–5.2)
BUN: 40 mg/dL — ABNORMAL HIGH (ref 6–23)
Chloride: 102 mEq/L (ref 96–112)
Creatinine, Ser: 2.03 mg/dL — ABNORMAL HIGH (ref 0.50–1.35)
GFR calc non Af Amer: 33 mL/min — ABNORMAL LOW (ref >60)
Phosphorus: 3 mg/dL (ref 2.3–4.6)
Potassium: 4.3 mEq/L (ref 3.5–5.1)

## 2011-07-07 LAB — URIC ACID: Uric Acid, Serum: 6.1 mg/dL (ref 4.0–7.8)

## 2011-07-07 LAB — POCT HEMOGLOBIN-HEMACUE: Hemoglobin: 11.8 g/dL — ABNORMAL LOW (ref 13.0–17.0)

## 2011-07-08 LAB — PTH, INTACT AND CALCIUM
Calcium, Total (PTH): 8.4 mg/dL (ref 8.4–10.5)
PTH: 235.9 pg/mL — ABNORMAL HIGH (ref 14.0–72.0)

## 2011-07-09 ENCOUNTER — Ambulatory Visit
Admission: RE | Admit: 2011-07-09 | Discharge: 2011-07-09 | Disposition: A | Discharge status: Home / Self Care | Payer: Medicare Other | Source: Ambulatory Visit | Attending: Internal Medicine | Admitting: Internal Medicine

## 2011-07-09 ENCOUNTER — Other Ambulatory Visit: Payer: Self-pay | Admitting: Internal Medicine

## 2011-07-09 DIAGNOSIS — M79605 Pain in left leg: Secondary | ICD-10-CM

## 2011-07-10 ENCOUNTER — Telehealth: Payer: Self-pay | Admitting: Cardiology

## 2011-07-10 NOTE — Telephone Encounter (Signed)
Walk In Pt Form " Pt Needs Refill Mailed In " sent to [facility]  07/10/11/km

## 2011-07-10 NOTE — Telephone Encounter (Signed)
I talked with pt. Pt saw Dr Shirlee Latch 05/27/10. Dr Shirlee Latch was not going to see him in followup. Pt states he has coreg for now. He will see his PCP 07/13/11 and ask his PCP about refilling coreg for him. Pt to call me back if he is unable to get a refill from his PCP.

## 2011-07-10 NOTE — Telephone Encounter (Signed)
Returning call back to nurse.  

## 2011-07-21 ENCOUNTER — Other Ambulatory Visit: Payer: Self-pay | Admitting: Nephrology

## 2011-07-21 ENCOUNTER — Encounter (HOSPITAL_COMMUNITY)
Admission: RE | Admit: 2011-07-21 | Discharge: 2011-07-21 | Disposition: A | Discharge status: Home / Self Care | Payer: Medicare Other | Source: Ambulatory Visit | Attending: Nephrology | Admitting: Nephrology

## 2011-07-21 DIAGNOSIS — D638 Anemia in other chronic diseases classified elsewhere: Secondary | ICD-10-CM | POA: Insufficient documentation

## 2011-07-21 DIAGNOSIS — N183 Chronic kidney disease, stage 3 unspecified: Secondary | ICD-10-CM | POA: Insufficient documentation

## 2011-07-21 LAB — IRON AND TIBC
Iron: 74 ug/dL (ref 42–135)
TIBC: 276 ug/dL (ref 215–435)

## 2011-07-30 ENCOUNTER — Ambulatory Visit (HOSPITAL_COMMUNITY): Admission: RE | Admit: 2011-07-30 | Payer: Medicare Other | Source: Ambulatory Visit | Admitting: Gastroenterology

## 2011-08-04 ENCOUNTER — Other Ambulatory Visit: Payer: Self-pay | Admitting: Nephrology

## 2011-08-04 ENCOUNTER — Encounter (HOSPITAL_COMMUNITY): Payer: Medicare Other

## 2011-08-14 ENCOUNTER — Other Ambulatory Visit (HOSPITAL_COMMUNITY): Payer: Self-pay | Admitting: *Deleted

## 2011-08-18 ENCOUNTER — Encounter (HOSPITAL_COMMUNITY)
Admission: RE | Admit: 2011-08-18 | Discharge: 2011-08-18 | Disposition: A | Discharge status: Home / Self Care | Payer: Medicare Other | Source: Ambulatory Visit | Attending: Nephrology | Admitting: Nephrology

## 2011-08-18 DIAGNOSIS — N183 Chronic kidney disease, stage 3 unspecified: Secondary | ICD-10-CM | POA: Insufficient documentation

## 2011-08-18 DIAGNOSIS — D638 Anemia in other chronic diseases classified elsewhere: Secondary | ICD-10-CM | POA: Insufficient documentation

## 2011-08-18 LAB — POCT HEMOGLOBIN-HEMACUE: Hemoglobin: 13.2 g/dL (ref 13.0–17.0)

## 2011-08-18 LAB — FERRITIN: Ferritin: 435 ng/mL — ABNORMAL HIGH (ref 22–322)

## 2011-08-18 MED ORDER — EPOETIN ALFA 10000 UNIT/ML IJ SOLN: 20000.0000 [IU] | INTRAMUSCULAR | Status: DC

## 2011-08-18 NOTE — Progress Notes (Signed)
Hemocue 13.2 Procrit not given

## 2011-09-01 ENCOUNTER — Encounter (HOSPITAL_COMMUNITY): Payer: Medicare Other

## 2011-09-02 ENCOUNTER — Encounter (HOSPITAL_COMMUNITY)
Admission: RE | Admit: 2011-09-02 | Discharge: 2011-09-02 | Disposition: A | Discharge status: Home / Self Care | Payer: Medicare Other | Source: Ambulatory Visit | Attending: Nephrology | Admitting: Nephrology

## 2011-09-02 LAB — POCT HEMOGLOBIN-HEMACUE: Hemoglobin: 12.1 g/dL — ABNORMAL LOW (ref 13.0–17.0)

## 2011-09-02 MED ORDER — EPOETIN ALFA 10000 UNIT/ML IJ SOLN: 20000.0000 [IU] | INTRAMUSCULAR | Status: DC

## 2011-09-17 ENCOUNTER — Encounter (HOSPITAL_COMMUNITY)
Admission: RE | Admit: 2011-09-17 | Discharge: 2011-09-17 | Disposition: A | Discharge status: Home / Self Care | Payer: Medicare Other | Source: Ambulatory Visit | Attending: Nephrology | Admitting: Nephrology

## 2011-09-17 DIAGNOSIS — D638 Anemia in other chronic diseases classified elsewhere: Secondary | ICD-10-CM | POA: Insufficient documentation

## 2011-09-17 DIAGNOSIS — N183 Chronic kidney disease, stage 3 unspecified: Secondary | ICD-10-CM | POA: Insufficient documentation

## 2011-09-17 LAB — POCT HEMOGLOBIN-HEMACUE: Hemoglobin: 12.3 g/dL — ABNORMAL LOW (ref 13.0–17.0)

## 2011-09-17 LAB — IRON AND TIBC
Iron: 94 ug/dL (ref 42–135)
Saturation Ratios: 34 % (ref 20–55)
TIBC: 273 ug/dL (ref 215–435)

## 2011-09-17 MED ORDER — EPOETIN ALFA 10000 UNIT/ML IJ SOLN: 20000.0000 [IU] | INTRAMUSCULAR | Status: DC

## 2011-09-29 ENCOUNTER — Encounter (HOSPITAL_COMMUNITY)
Admission: RE | Admit: 2011-09-29 | Discharge: 2011-09-29 | Disposition: A | Discharge status: Home / Self Care | Payer: Medicare Other | Source: Ambulatory Visit | Attending: Nephrology | Admitting: Nephrology

## 2011-09-29 LAB — POCT HEMOGLOBIN-HEMACUE: Hemoglobin: 12 g/dL — ABNORMAL LOW (ref 13.0–17.0)

## 2011-09-29 MED ORDER — EPOETIN ALFA 10000 UNIT/ML IJ SOLN: 20000.0000 [IU] | INTRAMUSCULAR | Status: DC

## 2011-10-08 ENCOUNTER — Other Ambulatory Visit (HOSPITAL_COMMUNITY): Payer: Self-pay | Admitting: *Deleted

## 2011-10-14 DIAGNOSIS — Z7901 Long term (current) use of anticoagulants: Secondary | ICD-10-CM | POA: Diagnosis not present

## 2011-10-14 DIAGNOSIS — I82409 Acute embolism and thrombosis of unspecified deep veins of unspecified lower extremity: Secondary | ICD-10-CM | POA: Diagnosis not present

## 2011-10-15 ENCOUNTER — Encounter (HOSPITAL_COMMUNITY)
Admission: RE | Admit: 2011-10-15 | Discharge: 2011-10-15 | Disposition: A | Discharge status: Home / Self Care | Payer: Medicare Other | Source: Ambulatory Visit | Attending: Nephrology | Admitting: Nephrology

## 2011-10-15 DIAGNOSIS — D638 Anemia in other chronic diseases classified elsewhere: Secondary | ICD-10-CM | POA: Diagnosis not present

## 2011-10-15 DIAGNOSIS — N183 Chronic kidney disease, stage 3 unspecified: Secondary | ICD-10-CM | POA: Insufficient documentation

## 2011-10-15 LAB — IRON AND TIBC
Saturation Ratios: 23 % (ref 20–55)
TIBC: 282 ug/dL (ref 215–435)
UIBC: 218 ug/dL (ref 125–400)

## 2011-10-19 ENCOUNTER — Other Ambulatory Visit (HOSPITAL_COMMUNITY): Payer: Self-pay | Admitting: *Deleted

## 2011-10-27 DIAGNOSIS — E291 Testicular hypofunction: Secondary | ICD-10-CM | POA: Diagnosis not present

## 2011-10-29 ENCOUNTER — Encounter (HOSPITAL_COMMUNITY)
Admission: RE | Admit: 2011-10-29 | Discharge: 2011-10-29 | Disposition: A | Discharge status: Home / Self Care | Payer: Medicare Other | Source: Ambulatory Visit | Attending: Nephrology | Admitting: Nephrology

## 2011-10-29 DIAGNOSIS — I82409 Acute embolism and thrombosis of unspecified deep veins of unspecified lower extremity: Secondary | ICD-10-CM | POA: Diagnosis not present

## 2011-10-29 DIAGNOSIS — D638 Anemia in other chronic diseases classified elsewhere: Secondary | ICD-10-CM | POA: Diagnosis not present

## 2011-10-29 LAB — RENAL FUNCTION PANEL
Albumin: 3.6 g/dL (ref 3.5–5.2)
Calcium: 9 mg/dL (ref 8.4–10.5)
GFR calc Af Amer: 36 mL/min — ABNORMAL LOW (ref >90)
Glucose, Bld: 172 mg/dL — ABNORMAL HIGH (ref 70–99)
Phosphorus: 3.2 mg/dL (ref 2.3–4.6)
Sodium: 137 mEq/L (ref 135–145)

## 2011-10-29 LAB — PROTEIN / CREATININE RATIO, URINE
Protein Creatinine Ratio: 0.31 — ABNORMAL HIGH (ref 0.00–0.15)
Total Protein, Urine: 14.1 mg/dL

## 2011-10-29 MED ORDER — EPOETIN ALFA 10000 UNIT/ML IJ SOLN: 20000.0000 [IU] | INTRAMUSCULAR | Status: DC

## 2011-11-10 DIAGNOSIS — R809 Proteinuria, unspecified: Secondary | ICD-10-CM | POA: Diagnosis not present

## 2011-11-10 DIAGNOSIS — D649 Anemia, unspecified: Secondary | ICD-10-CM | POA: Diagnosis not present

## 2011-11-12 ENCOUNTER — Encounter (HOSPITAL_COMMUNITY): Payer: Medicare Other

## 2011-11-26 DIAGNOSIS — I82409 Acute embolism and thrombosis of unspecified deep veins of unspecified lower extremity: Secondary | ICD-10-CM | POA: Diagnosis not present

## 2011-11-30 DIAGNOSIS — E291 Testicular hypofunction: Secondary | ICD-10-CM | POA: Diagnosis not present

## 2011-12-01 ENCOUNTER — Other Ambulatory Visit (HOSPITAL_COMMUNITY): Payer: Self-pay | Admitting: *Deleted

## 2011-12-02 ENCOUNTER — Encounter (HOSPITAL_COMMUNITY)
Admission: RE | Admit: 2011-12-02 | Discharge: 2011-12-02 | Disposition: A | Discharge status: Home / Self Care | Payer: Medicare Other | Source: Ambulatory Visit | Attending: Nephrology | Admitting: Nephrology

## 2011-12-02 DIAGNOSIS — D638 Anemia in other chronic diseases classified elsewhere: Secondary | ICD-10-CM | POA: Insufficient documentation

## 2011-12-02 DIAGNOSIS — N183 Chronic kidney disease, stage 3 unspecified: Secondary | ICD-10-CM | POA: Insufficient documentation

## 2011-12-02 LAB — IRON AND TIBC
Iron: 93 ug/dL (ref 42–135)
Saturation Ratios: 32 % (ref 20–55)
TIBC: 290 ug/dL (ref 215–435)

## 2011-12-02 LAB — FERRITIN: Ferritin: 467 ng/mL — ABNORMAL HIGH (ref 22–322)

## 2011-12-02 MED ORDER — EPOETIN ALFA 20000 UNIT/ML IJ SOLN
INTRAMUSCULAR | Status: AC
  Filled 2011-12-02: qty 1

## 2011-12-02 MED ORDER — EPOETIN ALFA 10000 UNIT/ML IJ SOLN: 20000.0000 [IU] | INTRAMUSCULAR | Status: DC

## 2011-12-28 DIAGNOSIS — I82409 Acute embolism and thrombosis of unspecified deep veins of unspecified lower extremity: Secondary | ICD-10-CM | POA: Diagnosis not present

## 2012-01-08 DIAGNOSIS — R809 Proteinuria, unspecified: Secondary | ICD-10-CM | POA: Diagnosis not present

## 2012-01-26 ENCOUNTER — Other Ambulatory Visit: Payer: Self-pay | Admitting: Cardiology

## 2012-01-27 ENCOUNTER — Encounter (HOSPITAL_COMMUNITY)
Admission: RE | Admit: 2012-01-27 | Discharge: 2012-01-27 | Disposition: A | Discharge status: Home / Self Care | Payer: Medicare Other | Source: Ambulatory Visit | Attending: Nephrology | Admitting: Nephrology

## 2012-01-27 DIAGNOSIS — D638 Anemia in other chronic diseases classified elsewhere: Secondary | ICD-10-CM | POA: Insufficient documentation

## 2012-01-27 DIAGNOSIS — N183 Chronic kidney disease, stage 3 unspecified: Secondary | ICD-10-CM | POA: Diagnosis not present

## 2012-01-27 LAB — FERRITIN: Ferritin: 556 ng/mL — ABNORMAL HIGH (ref 22–322)

## 2012-01-27 LAB — IRON AND TIBC
TIBC: 250 ug/dL (ref 215–435)
UIBC: 167 ug/dL (ref 125–400)

## 2012-01-27 LAB — POCT HEMOGLOBIN-HEMACUE: Hemoglobin: 12.8 g/dL — ABNORMAL LOW (ref 13.0–17.0)

## 2012-01-27 MED ORDER — EPOETIN ALFA 10000 UNIT/ML IJ SOLN: 20000.0000 [IU] | INTRAMUSCULAR | Status: DC

## 2012-02-01 DIAGNOSIS — E785 Hyperlipidemia, unspecified: Secondary | ICD-10-CM | POA: Diagnosis not present

## 2012-02-01 DIAGNOSIS — Z7901 Long term (current) use of anticoagulants: Secondary | ICD-10-CM | POA: Diagnosis not present

## 2012-02-01 DIAGNOSIS — Z79899 Other long term (current) drug therapy: Secondary | ICD-10-CM | POA: Diagnosis not present

## 2012-02-01 DIAGNOSIS — D126 Benign neoplasm of colon, unspecified: Secondary | ICD-10-CM | POA: Diagnosis not present

## 2012-02-01 DIAGNOSIS — E291 Testicular hypofunction: Secondary | ICD-10-CM | POA: Diagnosis not present

## 2012-02-01 DIAGNOSIS — I10 Essential (primary) hypertension: Secondary | ICD-10-CM | POA: Diagnosis not present

## 2012-02-01 DIAGNOSIS — M545 Low back pain: Secondary | ICD-10-CM | POA: Diagnosis not present

## 2012-02-01 DIAGNOSIS — E119 Type 2 diabetes mellitus without complications: Secondary | ICD-10-CM | POA: Diagnosis not present

## 2012-02-09 DIAGNOSIS — N401 Enlarged prostate with lower urinary tract symptoms: Secondary | ICD-10-CM | POA: Diagnosis not present

## 2012-02-09 DIAGNOSIS — C679 Malignant neoplasm of bladder, unspecified: Secondary | ICD-10-CM | POA: Diagnosis not present

## 2012-02-16 ENCOUNTER — Other Ambulatory Visit: Payer: Self-pay | Admitting: Cardiology

## 2012-02-16 DIAGNOSIS — I82409 Acute embolism and thrombosis of unspecified deep veins of unspecified lower extremity: Secondary | ICD-10-CM | POA: Diagnosis not present

## 2012-02-24 DIAGNOSIS — K5730 Diverticulosis of large intestine without perforation or abscess without bleeding: Secondary | ICD-10-CM | POA: Diagnosis not present

## 2012-02-24 DIAGNOSIS — D126 Benign neoplasm of colon, unspecified: Secondary | ICD-10-CM | POA: Diagnosis not present

## 2012-02-24 DIAGNOSIS — Z86010 Personal history of colonic polyps: Secondary | ICD-10-CM | POA: Diagnosis not present

## 2012-02-24 DIAGNOSIS — Z09 Encounter for follow-up examination after completed treatment for conditions other than malignant neoplasm: Secondary | ICD-10-CM | POA: Diagnosis not present

## 2012-03-02 DIAGNOSIS — E291 Testicular hypofunction: Secondary | ICD-10-CM | POA: Diagnosis not present

## 2012-03-15 DIAGNOSIS — I82409 Acute embolism and thrombosis of unspecified deep veins of unspecified lower extremity: Secondary | ICD-10-CM | POA: Diagnosis not present

## 2012-03-22 ENCOUNTER — Other Ambulatory Visit (HOSPITAL_COMMUNITY): Payer: Self-pay | Admitting: *Deleted

## 2012-03-23 ENCOUNTER — Encounter (HOSPITAL_COMMUNITY)
Admission: RE | Admit: 2012-03-23 | Discharge: 2012-03-23 | Disposition: A | Discharge status: Home / Self Care | Payer: Medicare Other | Source: Ambulatory Visit | Attending: Nephrology | Admitting: Nephrology

## 2012-03-23 DIAGNOSIS — N183 Chronic kidney disease, stage 3 unspecified: Secondary | ICD-10-CM | POA: Insufficient documentation

## 2012-03-23 DIAGNOSIS — D638 Anemia in other chronic diseases classified elsewhere: Secondary | ICD-10-CM | POA: Diagnosis not present

## 2012-03-23 LAB — IRON AND TIBC: Saturation Ratios: 32 % (ref 20–55)

## 2012-04-01 DIAGNOSIS — N2581 Secondary hyperparathyroidism of renal origin: Secondary | ICD-10-CM | POA: Diagnosis not present

## 2012-04-01 DIAGNOSIS — D649 Anemia, unspecified: Secondary | ICD-10-CM | POA: Diagnosis not present

## 2012-04-04 DIAGNOSIS — E291 Testicular hypofunction: Secondary | ICD-10-CM | POA: Diagnosis not present

## 2012-04-12 DIAGNOSIS — M109 Gout, unspecified: Secondary | ICD-10-CM | POA: Diagnosis not present

## 2012-04-12 DIAGNOSIS — I82409 Acute embolism and thrombosis of unspecified deep veins of unspecified lower extremity: Secondary | ICD-10-CM | POA: Diagnosis not present

## 2012-04-12 DIAGNOSIS — E119 Type 2 diabetes mellitus without complications: Secondary | ICD-10-CM | POA: Diagnosis not present

## 2012-04-12 DIAGNOSIS — I129 Hypertensive chronic kidney disease with stage 1 through stage 4 chronic kidney disease, or unspecified chronic kidney disease: Secondary | ICD-10-CM | POA: Diagnosis not present

## 2012-05-04 DIAGNOSIS — E291 Testicular hypofunction: Secondary | ICD-10-CM | POA: Diagnosis not present

## 2012-05-11 DIAGNOSIS — I82409 Acute embolism and thrombosis of unspecified deep veins of unspecified lower extremity: Secondary | ICD-10-CM | POA: Diagnosis not present

## 2012-05-17 ENCOUNTER — Other Ambulatory Visit (HOSPITAL_COMMUNITY): Payer: Self-pay | Admitting: *Deleted

## 2012-05-18 ENCOUNTER — Encounter (HOSPITAL_COMMUNITY)
Admission: RE | Admit: 2012-05-18 | Discharge: 2012-05-18 | Disposition: A | Discharge status: Home / Self Care | Payer: Medicare Other | Source: Ambulatory Visit | Attending: Nephrology | Admitting: Nephrology

## 2012-05-18 DIAGNOSIS — D638 Anemia in other chronic diseases classified elsewhere: Secondary | ICD-10-CM | POA: Insufficient documentation

## 2012-05-18 DIAGNOSIS — N183 Chronic kidney disease, stage 3 unspecified: Secondary | ICD-10-CM | POA: Insufficient documentation

## 2012-06-03 DIAGNOSIS — E785 Hyperlipidemia, unspecified: Secondary | ICD-10-CM | POA: Diagnosis not present

## 2012-06-03 DIAGNOSIS — Z125 Encounter for screening for malignant neoplasm of prostate: Secondary | ICD-10-CM | POA: Diagnosis not present

## 2012-06-03 DIAGNOSIS — E291 Testicular hypofunction: Secondary | ICD-10-CM | POA: Diagnosis not present

## 2012-06-03 DIAGNOSIS — E1129 Type 2 diabetes mellitus with other diabetic kidney complication: Secondary | ICD-10-CM | POA: Diagnosis not present

## 2012-06-03 DIAGNOSIS — I10 Essential (primary) hypertension: Secondary | ICD-10-CM | POA: Diagnosis not present

## 2012-06-09 DIAGNOSIS — I82409 Acute embolism and thrombosis of unspecified deep veins of unspecified lower extremity: Secondary | ICD-10-CM | POA: Diagnosis not present

## 2012-07-08 DIAGNOSIS — I82409 Acute embolism and thrombosis of unspecified deep veins of unspecified lower extremity: Secondary | ICD-10-CM | POA: Diagnosis not present

## 2012-07-19 DIAGNOSIS — R972 Elevated prostate specific antigen [PSA]: Secondary | ICD-10-CM | POA: Diagnosis not present

## 2012-07-19 DIAGNOSIS — N401 Enlarged prostate with lower urinary tract symptoms: Secondary | ICD-10-CM | POA: Diagnosis not present

## 2012-07-19 DIAGNOSIS — C679 Malignant neoplasm of bladder, unspecified: Secondary | ICD-10-CM | POA: Diagnosis not present

## 2012-07-21 DIAGNOSIS — I82409 Acute embolism and thrombosis of unspecified deep veins of unspecified lower extremity: Secondary | ICD-10-CM | POA: Diagnosis not present

## 2012-08-02 DIAGNOSIS — I82409 Acute embolism and thrombosis of unspecified deep veins of unspecified lower extremity: Secondary | ICD-10-CM | POA: Diagnosis not present

## 2012-08-09 DIAGNOSIS — D649 Anemia, unspecified: Secondary | ICD-10-CM | POA: Diagnosis not present

## 2012-08-30 DIAGNOSIS — I10 Essential (primary) hypertension: Secondary | ICD-10-CM | POA: Diagnosis not present

## 2012-08-30 DIAGNOSIS — I82409 Acute embolism and thrombosis of unspecified deep veins of unspecified lower extremity: Secondary | ICD-10-CM | POA: Diagnosis not present

## 2012-09-13 DIAGNOSIS — D500 Iron deficiency anemia secondary to blood loss (chronic): Secondary | ICD-10-CM | POA: Diagnosis not present

## 2012-09-13 DIAGNOSIS — I82409 Acute embolism and thrombosis of unspecified deep veins of unspecified lower extremity: Secondary | ICD-10-CM | POA: Diagnosis not present

## 2012-09-30 DIAGNOSIS — M545 Low back pain: Secondary | ICD-10-CM | POA: Diagnosis not present

## 2012-09-30 DIAGNOSIS — E1129 Type 2 diabetes mellitus with other diabetic kidney complication: Secondary | ICD-10-CM | POA: Diagnosis not present

## 2012-09-30 DIAGNOSIS — I10 Essential (primary) hypertension: Secondary | ICD-10-CM | POA: Diagnosis not present

## 2012-09-30 DIAGNOSIS — E785 Hyperlipidemia, unspecified: Secondary | ICD-10-CM | POA: Diagnosis not present

## 2012-09-30 DIAGNOSIS — Z0000 Encounter for general adult medical examination without abnormal findings: Secondary | ICD-10-CM | POA: Diagnosis not present

## 2012-09-30 DIAGNOSIS — E669 Obesity, unspecified: Secondary | ICD-10-CM | POA: Diagnosis not present

## 2012-10-11 DIAGNOSIS — R809 Proteinuria, unspecified: Secondary | ICD-10-CM | POA: Diagnosis not present

## 2012-10-11 DIAGNOSIS — D649 Anemia, unspecified: Secondary | ICD-10-CM | POA: Diagnosis not present

## 2012-10-11 DIAGNOSIS — N2581 Secondary hyperparathyroidism of renal origin: Secondary | ICD-10-CM | POA: Diagnosis not present

## 2012-10-11 DIAGNOSIS — M109 Gout, unspecified: Secondary | ICD-10-CM | POA: Diagnosis not present

## 2012-10-18 DIAGNOSIS — I82409 Acute embolism and thrombosis of unspecified deep veins of unspecified lower extremity: Secondary | ICD-10-CM | POA: Diagnosis not present

## 2012-10-19 DIAGNOSIS — E119 Type 2 diabetes mellitus without complications: Secondary | ICD-10-CM | POA: Diagnosis not present

## 2012-10-19 DIAGNOSIS — I129 Hypertensive chronic kidney disease with stage 1 through stage 4 chronic kidney disease, or unspecified chronic kidney disease: Secondary | ICD-10-CM | POA: Diagnosis not present

## 2012-10-19 DIAGNOSIS — R809 Proteinuria, unspecified: Secondary | ICD-10-CM | POA: Diagnosis not present

## 2012-11-15 DIAGNOSIS — I82409 Acute embolism and thrombosis of unspecified deep veins of unspecified lower extremity: Secondary | ICD-10-CM | POA: Diagnosis not present

## 2012-12-13 DIAGNOSIS — I82409 Acute embolism and thrombosis of unspecified deep veins of unspecified lower extremity: Secondary | ICD-10-CM | POA: Diagnosis not present

## 2013-01-12 DIAGNOSIS — I82409 Acute embolism and thrombosis of unspecified deep veins of unspecified lower extremity: Secondary | ICD-10-CM | POA: Diagnosis not present

## 2013-01-26 DIAGNOSIS — C679 Malignant neoplasm of bladder, unspecified: Secondary | ICD-10-CM | POA: Diagnosis not present

## 2013-01-26 DIAGNOSIS — N401 Enlarged prostate with lower urinary tract symptoms: Secondary | ICD-10-CM | POA: Diagnosis not present

## 2013-01-26 DIAGNOSIS — R972 Elevated prostate specific antigen [PSA]: Secondary | ICD-10-CM | POA: Diagnosis not present

## 2013-01-31 DIAGNOSIS — E785 Hyperlipidemia, unspecified: Secondary | ICD-10-CM | POA: Diagnosis not present

## 2013-01-31 DIAGNOSIS — G8929 Other chronic pain: Secondary | ICD-10-CM | POA: Diagnosis not present

## 2013-01-31 DIAGNOSIS — E1129 Type 2 diabetes mellitus with other diabetic kidney complication: Secondary | ICD-10-CM | POA: Diagnosis not present

## 2013-01-31 DIAGNOSIS — I82409 Acute embolism and thrombosis of unspecified deep veins of unspecified lower extremity: Secondary | ICD-10-CM | POA: Diagnosis not present

## 2013-01-31 DIAGNOSIS — I10 Essential (primary) hypertension: Secondary | ICD-10-CM | POA: Diagnosis not present

## 2013-02-09 DIAGNOSIS — I82409 Acute embolism and thrombosis of unspecified deep veins of unspecified lower extremity: Secondary | ICD-10-CM | POA: Diagnosis not present

## 2013-03-09 DIAGNOSIS — I82409 Acute embolism and thrombosis of unspecified deep veins of unspecified lower extremity: Secondary | ICD-10-CM | POA: Diagnosis not present

## 2013-03-24 DIAGNOSIS — R972 Elevated prostate specific antigen [PSA]: Secondary | ICD-10-CM | POA: Diagnosis not present

## 2013-03-24 DIAGNOSIS — N401 Enlarged prostate with lower urinary tract symptoms: Secondary | ICD-10-CM | POA: Diagnosis not present

## 2013-03-24 DIAGNOSIS — Z299 Encounter for prophylactic measures, unspecified: Secondary | ICD-10-CM | POA: Diagnosis not present

## 2013-03-31 DIAGNOSIS — N401 Enlarged prostate with lower urinary tract symptoms: Secondary | ICD-10-CM | POA: Diagnosis not present

## 2013-04-12 DIAGNOSIS — I82409 Acute embolism and thrombosis of unspecified deep veins of unspecified lower extremity: Secondary | ICD-10-CM | POA: Diagnosis not present

## 2013-04-12 DIAGNOSIS — R972 Elevated prostate specific antigen [PSA]: Secondary | ICD-10-CM | POA: Diagnosis not present

## 2013-04-12 DIAGNOSIS — N401 Enlarged prostate with lower urinary tract symptoms: Secondary | ICD-10-CM | POA: Diagnosis not present

## 2013-04-27 DIAGNOSIS — D649 Anemia, unspecified: Secondary | ICD-10-CM | POA: Diagnosis not present

## 2013-04-27 DIAGNOSIS — R809 Proteinuria, unspecified: Secondary | ICD-10-CM | POA: Diagnosis not present

## 2013-05-10 DIAGNOSIS — I82409 Acute embolism and thrombosis of unspecified deep veins of unspecified lower extremity: Secondary | ICD-10-CM | POA: Diagnosis not present

## 2013-05-31 DIAGNOSIS — E1129 Type 2 diabetes mellitus with other diabetic kidney complication: Secondary | ICD-10-CM | POA: Diagnosis not present

## 2013-05-31 DIAGNOSIS — I10 Essential (primary) hypertension: Secondary | ICD-10-CM | POA: Diagnosis not present

## 2013-05-31 DIAGNOSIS — E785 Hyperlipidemia, unspecified: Secondary | ICD-10-CM | POA: Diagnosis not present

## 2013-05-31 DIAGNOSIS — G8929 Other chronic pain: Secondary | ICD-10-CM | POA: Diagnosis not present

## 2013-06-07 DIAGNOSIS — I82409 Acute embolism and thrombosis of unspecified deep veins of unspecified lower extremity: Secondary | ICD-10-CM | POA: Diagnosis not present

## 2013-06-15 ENCOUNTER — Encounter: Payer: Self-pay | Admitting: Physical Medicine & Rehabilitation

## 2013-07-06 DIAGNOSIS — I82409 Acute embolism and thrombosis of unspecified deep veins of unspecified lower extremity: Secondary | ICD-10-CM | POA: Diagnosis not present

## 2013-07-07 ENCOUNTER — Ambulatory Visit (HOSPITAL_BASED_OUTPATIENT_CLINIC_OR_DEPARTMENT_OTHER): Payer: Medicare Other | Admitting: Physical Medicine & Rehabilitation

## 2013-07-07 ENCOUNTER — Encounter: Payer: Self-pay | Admitting: Physical Medicine & Rehabilitation

## 2013-07-07 ENCOUNTER — Encounter: Payer: Medicare Other | Attending: Physical Medicine & Rehabilitation

## 2013-07-07 VITALS — BP 150/79 | HR 99 | Resp 14 | Ht 70.0 in | Wt 255.0 lb

## 2013-07-07 DIAGNOSIS — M545 Low back pain, unspecified: Secondary | ICD-10-CM | POA: Insufficient documentation

## 2013-07-07 DIAGNOSIS — M25569 Pain in unspecified knee: Secondary | ICD-10-CM

## 2013-07-07 DIAGNOSIS — G894 Chronic pain syndrome: Secondary | ICD-10-CM | POA: Insufficient documentation

## 2013-07-07 DIAGNOSIS — Z79899 Other long term (current) drug therapy: Secondary | ICD-10-CM | POA: Diagnosis not present

## 2013-07-07 DIAGNOSIS — Z5181 Encounter for therapeutic drug level monitoring: Secondary | ICD-10-CM | POA: Diagnosis not present

## 2013-07-07 DIAGNOSIS — M25559 Pain in unspecified hip: Secondary | ICD-10-CM

## 2013-07-07 DIAGNOSIS — M549 Dorsalgia, unspecified: Secondary | ICD-10-CM

## 2013-07-07 NOTE — Progress Notes (Signed)
Subjective:    Patient ID: Albert Stanley, male    DOB: 1946/09/27, 67 y.o.   MRN: [id number] Chief complaint low back pain as well as left knee pain HPI 67 year old male with multiple pain complaints. No history of traumatic injury. Patient states his knee was injured on the left side playing football in the 1960s. Several years ago he underwent arthroscopic meniscal repair. In addition he had lumbar surgery many years ago by Dr. Roxan Hockey. He was reevaluated by Dr. Venetia Maxon from neurosurgery and was given some type of epidural injection which was not helpful.  No MRIs and the lumbar spine unavailable on the [facility] system. There was evidence of disc protrusion on thoracic MRI at T2-T3, in addition there was spondylosis at T10-T11 T11-T12 and T12-L1 Cervical spine MRI in 2006 showed spondylosis at C3-C4 and C6-C7 no evidence of stenosis CT of the abdomen and pelvis was reviewed. There was evidence of lumbar facet arthropathy at multiple levels in the lumbar spine. In addition there was no evidence of lumbar central stenosis  No knee x-rays were seen. THese may have been done in the orthopedic office.  Pain Inventory Average Pain 7 Pain Right Now 6 My pain is constant, burning, stabbing and aching  In the last 24 hours, has pain interfered with the following? General activity 7 Relation with others 6 Enjoyment of life 8 What TIME of day is your pain at its worst? morning, night Sleep (in general) Fair  Pain is worse with: bending, standing and some activites Pain improves with: rest and medication Relief from Meds: 5  Mobility walk without assistance walk with assistance use a cane how many minutes can you walk? 15-20 ability to climb steps?  yes do you drive?  yes Do you have any goals in this area?  no  Function not employed: date last employed 2011 retired Do you have any goals in this area?  no  Neuro/Psych tingling trouble walking  Prior Studies Any changes  since last visit?  no  Physicians involved in your care Any changes since last visit?  no   No family history on file. History   Social History  . Marital Status: Married    Spouse Name: N/A    Number of Children: N/A  . Years of Education: N/A   Social History Main Topics  . Smoking status: Not on file  . Smokeless tobacco: Not on file  . Alcohol Use: Not on file  . Drug Use: Not on file  . Sexual Activity: Not on file   Other Topics Concern  . Not on file   Social History Narrative  . No narrative on file   No past surgical history on file. No past medical history on file. BP 150/79  Pulse 99  Resp 14  Ht 5\' 10"  (1.778 m)  Wt 255 lb (115.667 kg)  BMI 36.59 kg/m2  SpO2 97%      Review of Systems  Cardiovascular: Positive for leg swelling.  Gastrointestinal: Positive for abdominal pain.  Endocrine:       Low/high blood sugar  Musculoskeletal: Positive for gait problem.  Neurological:       Tingling  Hematological: Bruises/bleeds easily.       Objective:   Physical Exam  Nursing note and vitals reviewed. Constitutional: He is oriented to person, place, and time. He appears well-developed and well-nourished.  Morbidly obese male  HENT:  Head: Normocephalic and atraumatic.  Eyes: Conjunctivae and EOM are normal. Pupils are equal,  round, and reactive to light.  Musculoskeletal:       Lumbar back: He exhibits decreased range of motion and tenderness.  Negative straight leg raise test Femoral stretch test is positive although difficult to say whether this is quad tightness versus radiculitis. Hip range of motion is good bilaterally. No pain. Mild tenderness over the left greater trochanter.  Tenderness over the left SI area as well.  Pain with lumbar extension greater than with lumbar flexion  Lumbar flexion has full range of motion lumbar extension less than 25% of normal  Neurological: He is alert and oriented to person, place, and time. He has  normal strength. He displays no atrophy. No sensory deficit. Coordination and gait normal.  Reflex Scores:      Tricep reflexes are 2+ on the right side and 2+ on the left side.      Bicep reflexes are 2+ on the right side and 2+ on the left side.      Brachioradialis reflexes are 2+ on the right side and 2+ on the left side.      Patellar reflexes are 2+ on the right side and 2+ on the left side.      Achilles reflexes are 0 on the right side and 0 on the left side. Psychiatric: He has a normal mood and affect.          Assessment & Plan:  1. Low back pain axial no radicular component. Imaging studies most consistent with lumbar spondylosis. Dedicated lumbar spine x-ray has not been done recently. Will repeat. We discussed treatment options including therapy, medications, injections such as medial branch blocks and radiofrequency procedure. He would like to get more information about his options and make further decision after we review the x-ray of the lumbar spine  2. Chronic pain syndrome has been using oxycodone 10 mg 1-3 tablets per day. Has  medications to last through October 19. Apparently primary caregiver longer wants to prescribed these medicines.  We discussed that overall goals to reduce usage of these medications. We will try to do that through a combination of exercise and injections.  3. Left knee pain likely has degenerative changes we'll check x-rays. May benefit from Synvisc injection

## 2013-07-07 NOTE — Patient Instructions (Signed)
Lumbar medial branch , facet joint joint injection, see pamphlets  Left knee injection with Synvisc   Ordered Xray at [facility] you may go prior to next visit in 2 wks  Need to check urine drug screen prior to prescribing oxycodone

## 2013-07-14 ENCOUNTER — Ambulatory Visit
Admission: RE | Admit: 2013-07-14 | Discharge: 2013-07-14 | Disposition: A | Discharge status: Home / Self Care | Payer: Medicare Other | Source: Ambulatory Visit | Attending: Physical Medicine & Rehabilitation | Admitting: Physical Medicine & Rehabilitation

## 2013-07-14 DIAGNOSIS — M25569 Pain in unspecified knee: Secondary | ICD-10-CM

## 2013-07-14 DIAGNOSIS — M47817 Spondylosis without myelopathy or radiculopathy, lumbosacral region: Secondary | ICD-10-CM | POA: Diagnosis not present

## 2013-07-14 DIAGNOSIS — M5137 Other intervertebral disc degeneration, lumbosacral region: Secondary | ICD-10-CM | POA: Diagnosis not present

## 2013-07-14 DIAGNOSIS — M549 Dorsalgia, unspecified: Secondary | ICD-10-CM

## 2013-07-17 ENCOUNTER — Ambulatory Visit (HOSPITAL_BASED_OUTPATIENT_CLINIC_OR_DEPARTMENT_OTHER): Payer: Medicare Other | Admitting: Physical Medicine & Rehabilitation

## 2013-07-17 ENCOUNTER — Encounter: Payer: Self-pay | Admitting: Physical Medicine & Rehabilitation

## 2013-07-17 ENCOUNTER — Encounter: Payer: Medicare Other | Attending: Physical Medicine & Rehabilitation

## 2013-07-17 VITALS — BP 151/76 | HR 96 | Resp 16 | Ht 70.0 in | Wt 257.0 lb

## 2013-07-17 DIAGNOSIS — M545 Low back pain, unspecified: Secondary | ICD-10-CM | POA: Insufficient documentation

## 2013-07-17 DIAGNOSIS — M5136 Other intervertebral disc degeneration, lumbar region: Secondary | ICD-10-CM | POA: Insufficient documentation

## 2013-07-17 DIAGNOSIS — M5137 Other intervertebral disc degeneration, lumbosacral region: Secondary | ICD-10-CM | POA: Diagnosis not present

## 2013-07-17 DIAGNOSIS — M25569 Pain in unspecified knee: Secondary | ICD-10-CM | POA: Insufficient documentation

## 2013-07-17 DIAGNOSIS — R209 Unspecified disturbances of skin sensation: Secondary | ICD-10-CM | POA: Diagnosis not present

## 2013-07-17 DIAGNOSIS — M47817 Spondylosis without myelopathy or radiculopathy, lumbosacral region: Secondary | ICD-10-CM | POA: Diagnosis not present

## 2013-07-17 DIAGNOSIS — M51379 Other intervertebral disc degeneration, lumbosacral region without mention of lumbar back pain or lower extremity pain: Secondary | ICD-10-CM

## 2013-07-17 DIAGNOSIS — G894 Chronic pain syndrome: Secondary | ICD-10-CM | POA: Insufficient documentation

## 2013-07-17 MED ORDER — OXYCODONE-ACETAMINOPHEN 7.5-325 MG PO TABS: 1.0000 | ORAL_TABLET | Freq: Three times a day (TID) | ORAL | Status: DC | PRN

## 2013-07-17 NOTE — Patient Instructions (Addendum)
Percocet=oxycodone  7.5mg  three times as day  Call schedule injection

## 2013-07-17 NOTE — Progress Notes (Signed)
Subjective:    Patient ID: Albert Stanley, male    DOB: 1946/02/06, 67 y.o.   MRN: [id number] 67 year old male with multiple pain complaints. No history of traumatic injury. Patient states his knee was injured on the left side playing football in the 1960s. Several years ago he underwent arthroscopic meniscal repair.  In addition he had lumbar surgery many years ago by Dr. Roxan Hockey. He was reevaluated by Dr. Venetia Maxon from neurosurgery and was given some type of epidural injection which was not helpful.  No MRIs and the lumbar spine unavailable on the [facility] system. There was evidence of disc protrusion on thoracic MRI at T2-T3, in addition there was spondylosis at T10-T11 T11-T12 and T12-L1  Cervical spine MRI in 2006 showed spondylosis at C3-C4 and C6-C7 no evidence of stenosis  CT of the abdomen and pelvis was reviewed. There was evidence of lumbar facet arthropathy at multiple levels in the lumbar spine. In addition there was no evidence of lumbar central stenosis  HPI Chief complaint is right sided low back pain Pain is partially responsive to medication management. Interferes with standing and walking. No bowel or bladder dysfunction no progressive weakness No sweats chills, no weight loss  X-ray of the left knee was unremarkable. Lumbar spine x-rays shows progression of degenerative disc disease at L2-3 and L5-S1 levels. Left thigh numbness with prolonged standing Pain Inventory Average Pain 7 Pain Right Now 8 My pain is constant, sharp and stabbing  In the last 24 hours, has pain interfered with the following? General activity 8 Relation with others 8 Enjoyment of life 8 What TIME of day is your pain at its worst? constant Sleep (in general) Fair  Pain is worse with: bending and standing Pain improves with: rest, pacing activities and medication Relief from Meds: 8 but takes more medication than prescribed  Mobility walk without assistance how many minutes can you  walk? 15 ability to climb steps?  yes do you drive?  yes Do you have any goals in this area?  yes  Function retired  Neuro/Psych bowel control problems tingling dizziness  Prior Studies Any changes since last visit?  no  Physicians involved in your care Any changes since last visit?  no   Family History  Problem Relation Age of Onset  . Arthritis Mother   . Cancer Father   . Heart disease Father   . Hypertension Sister   . Alcohol abuse Brother   . Diabetes Daughter    History   Social History  . Marital Status: Married    Spouse Name: N/A    Number of Children: N/A  . Years of Education: N/A   Social History Main Topics  . Smoking status: Former Games developer  . Smokeless tobacco: Never Used  . Alcohol Use: None  . Drug Use: None  . Sexual Activity: None   Other Topics Concern  . None   Social History Narrative  . None   Past Surgical History  Procedure Laterality Date  . Back surgery      2 times  . Joint replacement      hip   Past Medical History  Diagnosis Date  . Hypertension   . Diabetes mellitus without complication   . Asthma   . Hyperlipidemia    BP 151/76  Pulse 96  Resp 16  Ht 5\' 10"  (1.778 m)  Wt 257 lb (116.574 kg)  BMI 36.88 kg/m2  SpO2 97%     Review of Systems  Gastrointestinal: Positive  for constipation.  Musculoskeletal: Positive for back pain and gait problem.  Neurological:       Tingling  All other systems reviewed and are negative.       Objective:   Physical Exam  Nursing note and vitals reviewed. Constitutional: He appears well-developed and well-nourished.  HENT:  Head: Normocephalic and atraumatic.  Psychiatric: He has a normal mood and affect.    Pain with lumbar extension greater than with lumbar flexion  Lumbar flexion has full range of motion lumbar extension less than 25% of normal  Neurological: He is alert and oriented to person, place, and time. He has normal strength. He displays no atrophy.  No sensory deficit. Coordination and gait normal.  Reflex Scores:  Tricep reflexes are 2+ on the right side and 2+ on the left side.  Bicep reflexes are 2+ on the right side and 2+ on the left side.  Brachioradialis reflexes are 2+ on the right side and 2+ on the left side.  Patellar reflexes are 2+ on the right side and 2+ on the left side.  Achilles reflexes are 0 on the right side and 0 on the left side. Psychiatric: He has a normal mood and affect.   Valgus deformity left greater than right knee      Assessment & Plan:  1. Low back pain axial no radicular component. Imaging studies most consistent with lumbar spondylosis. Dedicated lumbar spine x-ray has not been done recently. Will repeat.  We discussed treatment options including therapy, medications, injections such as medial branch blocks and radiofrequency procedure. He would like to get more information about his options and make further decision after we review the x-ray of the lumbar spine  2. Chronic pain syndrome has been using oxycodone 5mg  1-3 tablets per day. Has medications to last through October 19. Apparently primary caregiver no longer wants to prescribed these medicines.  We can change to oxycodone 7.5 We discussed that overall goals to reduce usage of these medications. We will try to do that through a combination of exercise and injections.  The patient is apprehensive about trying any type of injections. We discussed medial branch blocks and trigger point injections. He is thinking about trying a trigger point injection since he would not need to come off Coumadin for this Information pamphlet regarding trigger point injections 3. Left knee pain likely has meniscal pathology, injection and discussed worse. Consider knee brace for valgus deformity  4. Intermittent right thigh numbness may be related to L2-3 degenerative disc. Also may have the lateral femoral cutaneous entrapment

## 2013-08-04 DIAGNOSIS — I82409 Acute embolism and thrombosis of unspecified deep veins of unspecified lower extremity: Secondary | ICD-10-CM | POA: Diagnosis not present

## 2013-08-08 DIAGNOSIS — R972 Elevated prostate specific antigen [PSA]: Secondary | ICD-10-CM | POA: Diagnosis not present

## 2013-08-11 DIAGNOSIS — R972 Elevated prostate specific antigen [PSA]: Secondary | ICD-10-CM | POA: Diagnosis not present

## 2013-08-11 DIAGNOSIS — C679 Malignant neoplasm of bladder, unspecified: Secondary | ICD-10-CM | POA: Diagnosis not present

## 2013-08-11 DIAGNOSIS — N139 Obstructive and reflux uropathy, unspecified: Secondary | ICD-10-CM | POA: Diagnosis not present

## 2013-08-11 DIAGNOSIS — N401 Enlarged prostate with lower urinary tract symptoms: Secondary | ICD-10-CM | POA: Diagnosis not present

## 2013-08-14 ENCOUNTER — Encounter: Payer: Medicare Other | Attending: Physical Medicine & Rehabilitation

## 2013-08-14 ENCOUNTER — Encounter: Payer: Self-pay | Admitting: Physical Medicine & Rehabilitation

## 2013-08-14 ENCOUNTER — Ambulatory Visit (HOSPITAL_BASED_OUTPATIENT_CLINIC_OR_DEPARTMENT_OTHER): Payer: Medicare Other | Admitting: Physical Medicine & Rehabilitation

## 2013-08-14 VITALS — BP 161/82 | HR 103 | Resp 14 | Ht 71.0 in | Wt 253.2 lb

## 2013-08-14 DIAGNOSIS — G894 Chronic pain syndrome: Secondary | ICD-10-CM | POA: Diagnosis not present

## 2013-08-14 DIAGNOSIS — M1710 Unilateral primary osteoarthritis, unspecified knee: Secondary | ICD-10-CM

## 2013-08-14 DIAGNOSIS — M5137 Other intervertebral disc degeneration, lumbosacral region: Secondary | ICD-10-CM

## 2013-08-14 DIAGNOSIS — M25569 Pain in unspecified knee: Secondary | ICD-10-CM | POA: Insufficient documentation

## 2013-08-14 DIAGNOSIS — M1711 Unilateral primary osteoarthritis, right knee: Secondary | ICD-10-CM

## 2013-08-14 DIAGNOSIS — M47817 Spondylosis without myelopathy or radiculopathy, lumbosacral region: Secondary | ICD-10-CM

## 2013-08-14 DIAGNOSIS — M545 Low back pain, unspecified: Secondary | ICD-10-CM | POA: Insufficient documentation

## 2013-08-14 DIAGNOSIS — M5136 Other intervertebral disc degeneration, lumbar region: Secondary | ICD-10-CM

## 2013-08-14 DIAGNOSIS — M51369 Other intervertebral disc degeneration, lumbar region without mention of lumbar back pain or lower extremity pain: Secondary | ICD-10-CM

## 2013-08-14 MED ORDER — OXYCODONE HCL 5 MG PO TABS: 5.0000 mg | ORAL_TABLET | Freq: Four times a day (QID) | ORAL | Status: DC | PRN

## 2013-08-14 NOTE — Progress Notes (Signed)
Subjective:    Patient ID: Albert Stanley, male    DOB: 27-May-1946, 67 y.o.   MRN: [id number]  HPI 67 year old male with multiple pain complaints. No history of traumatic injury. Patient states his knee was injured on the left side playing football in the 1960s. Several years ago he underwent arthroscopic meniscal repair.  In addition he had lumbar surgery many years ago by Dr. Roxan Hockey. He was reevaluated by Dr. Venetia Maxon from neurosurgery and was given some type of epidural injection which was not helpful.  No MRIs and the lumbar spine unavailable on the [facility] system. There was evidence of disc protrusion on thoracic MRI at T2-T3, in addition there was spondylosis at T10-T11 T11-T12 and T12-L1  Cervical spine MRI in 2006 showed spondylosis at C3-C4 and C6-C7 no evidence of stenosis  CT of the abdomen and pelvis was reviewed. There was evidence of lumbar facet arthropathy at multiple levels in the lumbar spine. In addition there was no evidence of lumbar central stenosis  Oxycodone 7.5 mg more helpful for pain  But caused reduced balance without fall  Pain Inventory Average Pain 8 Pain Right Now 6 My pain is sharp, stabbing and aching  In the last 24 hours, has pain interfered with the following? General activity 6 Relation with others 9 Enjoyment of life 8 What TIME of day is your pain at its worst? morning, night Sleep (in general) Fair  Pain is worse with: walking, bending and standing Pain improves with: rest and medication Relief from Meds: 8  Mobility walk without assistance how many minutes can you walk? 10-12 ability to climb steps?  yes do you drive?  yes transfers alone Do you have any goals in this area?  yes  Function retired Do you have any goals in this area?  no  Neuro/Psych trouble walking dizziness  Prior Studies Any changes since last visit?  yes x-rays LUMBAR SPINE - 2-3 VIEW  COMPARISON: 12/23/2009  FINDINGS:  There is mild disc space  narrowing and L1-2. There is progression of  degenerative disc disease at L2-3 with further loss of disc height  and endplate sclerosis. There is mild disc space narrowing at L3-4.  There is chronic degenerative disc disease at L4-5 with complete  loss of disc space. There is progressive degenerative disc disease  at L5-S1 with more loss of disc space and endplate sclerosis. Lower  lumbar facet degeneration is again noted.  IMPRESSION:  Progressive degenerative disc disease at L2-3 and L5-S1 since the  study of 2011. Chronic degenerative changes at other levels most  pronounced at L4-5. Certainly, the findings could relate to back  pain or radicular pain.  Physicians involved in your care Any changes since last visit?  no   Family History  Problem Relation Age of Onset  . Arthritis Mother   . Cancer Father   . Heart disease Father   . Hypertension Sister   . Alcohol abuse Brother   . Diabetes Daughter    History   Social History  . Marital Status: Married    Spouse Name: N/A    Number of Children: N/A  . Years of Education: N/A   Social History Main Topics  . Smoking status: Former Games developer  . Smokeless tobacco: Never Used  . Alcohol Use: None  . Drug Use: None  . Sexual Activity: None   Other Topics Concern  . None   Social History Narrative  . None   Past Surgical History  Procedure Laterality  Date  . Back surgery      2 times  . Joint replacement      hip   Past Medical History  Diagnosis Date  . Hypertension   . Diabetes mellitus without complication   . Asthma   . Hyperlipidemia    BP 161/82  Pulse 103  Resp 14  Ht 5\' 11"  (1.803 m)  Wt 253 lb 3.2 oz (114.851 kg)  BMI 35.33 kg/m2  SpO2 98%     Review of Systems  Constitutional: Positive for appetite change.  Gastrointestinal: Positive for abdominal pain.  Musculoskeletal: Positive for back pain and gait problem.  Neurological: Positive for dizziness.  All other systems reviewed and are  negative.       Objective:   Physical Exam  Nursing note and vitals reviewed.  Constitutional: He appears well-developed and well-nourished.  HENT:  Head: Normocephalic and atraumatic.  Psychiatric: He has a normal mood and affect.   Pain with lumbar extension greater than with lumbar flexion  Lumbar flexion has full range of motion lumbar extension less than 25% of normal  Neurological: He is alert and oriented to person, place, and time. He has normal strength. He displays no atrophy. No sensory deficit. Coordination and gait normal.  Reflex Scores:  Tricep reflexes are 2+ on the right side and 2+ on the left side.  Bicep reflexes are 2+ on the right side and 2+ on the left side.  Brachioradialis reflexes are 2+ on the right side and 2+ on the left side.  Patellar reflexes are 2+ on the right side and 2+ on the left side.  Achilles reflexes are 0 on the right side and 0 on the left side. Psychiatric: He has a normal mood and affect.  Valgus deformity left greater than right knee       Assessment & Plan:  1. Low back pain axial no radicular component. Imaging studies most consistent with lumbar spondylosis. Dedicated lumbar spine x-ray has not been done recently. Will repeat.  We discussed treatment options including therapy, medications, injections such as medial branch blocks and radiofrequency procedure. He would like to get more information about his options and make further decision after we review the x-ray of the lumbar spine  2. Chronic pain syndrome has been using oxycodone 7.5mg  3 tablets per day.We can change back  to oxycodone 5mg  but increase to QID We discussed that overall goals to reduce usage of these medications. We will try to do that through a combination of exercise and injections.  The patient is apprehensive about trying any type of injections. We discussed medial branch blocks and trigger point injections. He is thinking about trying a trigger point  injection since he would not need to come off Coumadin for this Information pamphlet regarding trigger point injections, schedule for next month 3. Left knee pain likely has meniscal pathology,no effusion. Consider knee brace for valgus deformity  4. Intermittent right thigh numbness may be related to L2-3 degenerative disc. Also may have the lateral femoral cutaneous entrapment

## 2013-08-14 NOTE — Patient Instructions (Signed)
Next visit will be trigger point injections Do not stop Coumadin Do not need a driver We will switch oxycodone back to 5 mg but take 4 times per day

## 2013-08-15 ENCOUNTER — Telehealth: Payer: Self-pay

## 2013-08-15 NOTE — Telephone Encounter (Signed)
Informed patient to take 1 regular strength tylenol with his oxycodone.  He agreed.

## 2013-08-15 NOTE — Telephone Encounter (Signed)
Patient called regarding his oxycodone prescription.  He thought he was getting 5/325 but he got 5mg .  Patient wanted to make sure that was correct.  He doesn't feel like it has worked as well.  Please advise.

## 2013-08-15 NOTE — Telephone Encounter (Signed)
.   correct, only difference is 1 regular strength Tylenol

## 2013-08-17 DIAGNOSIS — I82409 Acute embolism and thrombosis of unspecified deep veins of unspecified lower extremity: Secondary | ICD-10-CM | POA: Diagnosis not present

## 2013-08-21 ENCOUNTER — Telehealth: Payer: Self-pay

## 2013-08-21 NOTE — Telephone Encounter (Signed)
Patient says his medications are not working.  He would like to go back on percocet 7.5/325.  Patient has been taking tylenol and it does not seem to be helping.  He says the tylenol is hurting his stomach.  He said he never had stomach issue in the past.  He would like to get a script for percocet 7.5/325.

## 2013-08-21 NOTE — Telephone Encounter (Signed)
Patient reported having balance issues with the oxycodone 7.5 so I won't go back to that.  Tylenol is the same ingredient that is in percocet so no differences there.  I recommend to cont current meds until next visit.  If he wants a change sooner please get him in with Clydie Braun

## 2013-08-21 NOTE — Telephone Encounter (Signed)
Patient called with questions about medications.

## 2013-08-22 ENCOUNTER — Encounter
Payer: Medicare Other | Attending: Physical Medicine and Rehabilitation | Admitting: Physical Medicine and Rehabilitation

## 2013-08-22 ENCOUNTER — Encounter: Payer: Self-pay | Admitting: Physical Medicine and Rehabilitation

## 2013-08-22 VITALS — BP 157/86 | HR 94 | Resp 14 | Ht 71.0 in | Wt 252.0 lb

## 2013-08-22 DIAGNOSIS — M47817 Spondylosis without myelopathy or radiculopathy, lumbosacral region: Secondary | ICD-10-CM | POA: Insufficient documentation

## 2013-08-22 DIAGNOSIS — R209 Unspecified disturbances of skin sensation: Secondary | ICD-10-CM | POA: Insufficient documentation

## 2013-08-22 DIAGNOSIS — G894 Chronic pain syndrome: Secondary | ICD-10-CM | POA: Diagnosis not present

## 2013-08-22 DIAGNOSIS — M545 Low back pain, unspecified: Secondary | ICD-10-CM | POA: Diagnosis not present

## 2013-08-22 DIAGNOSIS — M25569 Pain in unspecified knee: Secondary | ICD-10-CM | POA: Insufficient documentation

## 2013-08-22 MED ORDER — OXYCODONE-ACETAMINOPHEN 5-325 MG PO TABS: 1.0000 | ORAL_TABLET | Freq: Four times a day (QID) | ORAL | Status: DC | PRN

## 2013-08-22 NOTE — Patient Instructions (Signed)
You could try Arnica cream for your arthritic pain, also look into aquatic exercising in your center

## 2013-08-22 NOTE — Progress Notes (Signed)
Subjective:    Patient ID: Albert Stanley, male    DOB: 08/11/1946, 67 y.o.   MRN: [id number]  HPI The patient is a 67 year old male, who presents with LBP . The symptoms started years ago. The patient complains about moderate to severe pain , which radiate intermittendly into his LEs more to the right.  Patient also complains about intermittend numbness in his right thigh, after standing for a prolonged time . He describes the pain as aching and sometimes sharp . Applying heat, taking medications , changing positions alleviate the symptoms. Prolonged standing and walking   aggrevates the symptoms. The patient grades his pain as a  9/10. Patient reports that the medication, Oxycodone 5mg , qid, which was prescribed at his last visit 1 week ago, did not give him any relief.He was on Oxycodone 5/325mg , before, and he states that this medication relieved his pain. He has called our office last week and it was recommended that he should take a Tylenol with his Oxycodone, which would be the same as his old medication, but the patient states that it although he did this it did not have the same effect as the Oxy 5/325 . He reports that he had to take even more tablets per day, and still did not have the same relief. He states that he does not want to take more, he would like to go back to the medication, which has helped him in the past. Hx of spine surgery in the past. Pain Inventory Average Pain 9 Pain Right Now 9 My pain is sharp and aching  In the last 24 hours, has pain interfered with the following? General activity 9 Relation with others 9 Enjoyment of life 9 What TIME of day is your pain at its worst? morning and night  Sleep (in general) Fair  Pain is worse with: walking, bending and standing Pain improves with: rest, heat/ice and medication Relief from Meds: 5  Mobility walk without assistance how many minutes can you walk? 10 ability to climb steps?  yes do you drive?   yes  Function disabled: date disabled . retired  Neuro/Psych trouble walking  Prior Studies Any changes since last visit?  no  Physicians involved in your care Any changes since last visit?  no   Family History  Problem Relation Age of Onset  . Arthritis Mother   . Cancer Father   . Heart disease Father   . Hypertension Sister   . Alcohol abuse Brother   . Diabetes Daughter    History   Social History  . Marital Status: Married    Spouse Name: N/A    Number of Children: N/A  . Years of Education: N/A   Social History Main Topics  . Smoking status: Former Games developer  . Smokeless tobacco: Never Used  . Alcohol Use: None  . Drug Use: None  . Sexual Activity: None   Other Topics Concern  . None   Social History Narrative  . None   Past Surgical History  Procedure Laterality Date  . Back surgery      2 times  . Joint replacement      hip   Past Medical History  Diagnosis Date  . Hypertension   . Diabetes mellitus without complication   . Asthma   . Hyperlipidemia    BP 157/86  Pulse 94  Resp 14  Ht 5\' 11"  (1.803 m)  Wt 252 lb (114.306 kg)  BMI 35.16 kg/m2  SpO2 97%  Review of Systems  Musculoskeletal: Positive for back pain.  All other systems reviewed and are negative.       Objective:   Physical Exam  Constitutional: He is oriented to person, place, and time. He appears well-developed and well-nourished.  HENT:  Head: Normocephalic.  Neck: Neck supple.  Musculoskeletal: He exhibits tenderness.  Neurological: He is alert and oriented to person, place, and time.  Skin: Skin is warm and dry.  Psychiatric: He has a normal mood and affect.   Symmetric normal motor tone is noted throughout. Normal muscle bulk. Muscle testing reveals 5/5 muscle strength of the upper extremity, and 5/5 of the lower extremity.ROM of spine is  restricted. Fine motor movements are normal in both hands. Sensory is intact and symmetric to light touch, pinprick  and proprioception. DTR in the upper and lower extremity are present and symmetric 2+, achilles tendon reflex 1+ bilateral. No clonus is noted.  Patient arises from chair with mild difficulty. Narrow based antalgic gait with normal arm swing bilateral , able to walk on heels and toes . Tandem walk is stable. No pronator drift. Rhomberg negative. Left genu valgus deformity SLR negative      Assessment & Plan:  1. Low back pain axial no radicular component. Imaging studies severe lumbar spondylosis.  We discussed treatment options including therapy, medications, injections such as medial branch blocks and radiofrequency procedure. Recommended aquatic exercising in his senior center, where they have a pool, he will look into this.  2. Chronic pain syndrome has been using oxycodone 7.5mg  3 tablets per day.Changed back to oxycodone 5mg   QID .Oxycodone 5mg , qid, which was prescribed at his last visit 1 week ago, did not give him any relief.He was on Oxycodone 5/325mg , before, and he states that this medication relieved his pain. He has called our office last week and it was recommended that he should take a Tylenol with his Oxycodone, which would be the same as his old medication, but the patient states that it although he did this it did not have the same effect as the Oxy 5/325 . He reports that he had to take even more tablets per day, and still did not have the same relief. He states that he does not want to take more, he would like to go back to the medication, which has helped him in the past. We destroyed his Oxycodone 5mg  today , and I gave him a Rx for the Oxycodone 5/325, qid # 120 for 30 days. We discussed that overall goals to reduce usage of these medications. We will try to do that through a combination of exercise and injections.  The patient is apprehensive about trying any type of injections. We discussed medial branch blocks and trigger point injections. He is thinking about trying a  trigger point injection since he would not need to come off Coumadin for this  Information pamphlet regarding trigger point injections, scheduled for next month  3. Left knee pain likely has meniscal pathology,no effusion. Consider knee brace for valgus deformity  4. Intermittent right thigh numbness may be related to L2-3 degenerative disc. Also may have the lateral femoral cutaneous entrapment Has follow up appointment with Dr. Doroteo Bradford beginning of Dec

## 2013-08-22 NOTE — Telephone Encounter (Signed)
Advised patient to continue tylenol with medication, but he is not happy with that because it is upsetting his stomach.  Patient scheduled to see Clydie Braun.

## 2013-09-11 ENCOUNTER — Ambulatory Visit (HOSPITAL_BASED_OUTPATIENT_CLINIC_OR_DEPARTMENT_OTHER): Payer: Medicare Other | Admitting: Physical Medicine & Rehabilitation

## 2013-09-11 ENCOUNTER — Encounter: Payer: Medicare Other | Attending: Physical Medicine & Rehabilitation

## 2013-09-11 ENCOUNTER — Encounter: Payer: Self-pay | Admitting: Physical Medicine & Rehabilitation

## 2013-09-11 VITALS — BP 155/82 | HR 89 | Resp 14 | Ht 71.0 in | Wt 252.6 lb

## 2013-09-11 DIAGNOSIS — M51379 Other intervertebral disc degeneration, lumbosacral region without mention of lumbar back pain or lower extremity pain: Secondary | ICD-10-CM

## 2013-09-11 DIAGNOSIS — M5137 Other intervertebral disc degeneration, lumbosacral region: Secondary | ICD-10-CM

## 2013-09-11 DIAGNOSIS — M25569 Pain in unspecified knee: Secondary | ICD-10-CM | POA: Diagnosis not present

## 2013-09-11 DIAGNOSIS — M47817 Spondylosis without myelopathy or radiculopathy, lumbosacral region: Secondary | ICD-10-CM | POA: Diagnosis not present

## 2013-09-11 DIAGNOSIS — G894 Chronic pain syndrome: Secondary | ICD-10-CM | POA: Insufficient documentation

## 2013-09-11 DIAGNOSIS — M545 Low back pain, unspecified: Secondary | ICD-10-CM | POA: Insufficient documentation

## 2013-09-11 DIAGNOSIS — M5136 Other intervertebral disc degeneration, lumbar region: Secondary | ICD-10-CM

## 2013-09-11 MED ORDER — OXYCODONE-ACETAMINOPHEN 5-325 MG PO TABS: 1.0000 | ORAL_TABLET | Freq: Four times a day (QID) | ORAL | Status: DC | PRN

## 2013-09-11 NOTE — Progress Notes (Signed)
Pt originally here for trigger point injection R gluteus medius but that pain has subside  Has Left lateral hip pain.  No numbness or tingling down left leg.  Gen NAD Mood and affect are appropriate Lumbar:  No tenderness along lumbar paraspinals MSK:  Left hip tenderness to palp greater troch   A/P:  Myofascial pain R Glut med resolved- monitor , if pain recurs consider TPI  2.  Left troch bursitis re injection but pt declines secondary to DM and concerns about steroids  3.  Chronic pain syndrome- cont [facility], RTC 5 wks

## 2013-09-11 NOTE — Patient Instructions (Signed)
call for a trigger point injection if the right-sided buttocks pain returns Right gluteus medius muscle

## 2013-09-20 DIAGNOSIS — Z7901 Long term (current) use of anticoagulants: Secondary | ICD-10-CM | POA: Diagnosis not present

## 2013-09-20 DIAGNOSIS — I82409 Acute embolism and thrombosis of unspecified deep veins of unspecified lower extremity: Secondary | ICD-10-CM | POA: Diagnosis not present

## 2013-09-28 DIAGNOSIS — I82409 Acute embolism and thrombosis of unspecified deep veins of unspecified lower extremity: Secondary | ICD-10-CM | POA: Diagnosis not present

## 2013-10-09 DIAGNOSIS — I10 Essential (primary) hypertension: Secondary | ICD-10-CM | POA: Diagnosis not present

## 2013-10-09 DIAGNOSIS — E785 Hyperlipidemia, unspecified: Secondary | ICD-10-CM | POA: Diagnosis not present

## 2013-10-09 DIAGNOSIS — Z0000 Encounter for general adult medical examination without abnormal findings: Secondary | ICD-10-CM | POA: Diagnosis not present

## 2013-10-09 DIAGNOSIS — E1129 Type 2 diabetes mellitus with other diabetic kidney complication: Secondary | ICD-10-CM | POA: Diagnosis not present

## 2013-10-09 DIAGNOSIS — M545 Low back pain: Secondary | ICD-10-CM | POA: Diagnosis not present

## 2013-10-17 ENCOUNTER — Ambulatory Visit (HOSPITAL_BASED_OUTPATIENT_CLINIC_OR_DEPARTMENT_OTHER): Payer: Medicare Other | Admitting: Physical Medicine & Rehabilitation

## 2013-10-17 ENCOUNTER — Encounter: Payer: Self-pay | Admitting: Physical Medicine & Rehabilitation

## 2013-10-17 ENCOUNTER — Encounter: Payer: Medicare Other | Attending: Physical Medicine & Rehabilitation

## 2013-10-17 VITALS — BP 144/81 | HR 97 | Resp 16 | Ht 70.0 in | Wt 252.0 lb

## 2013-10-17 DIAGNOSIS — M545 Low back pain, unspecified: Secondary | ICD-10-CM | POA: Diagnosis not present

## 2013-10-17 DIAGNOSIS — M7060 Trochanteric bursitis, unspecified hip: Secondary | ICD-10-CM | POA: Insufficient documentation

## 2013-10-17 DIAGNOSIS — M25569 Pain in unspecified knee: Secondary | ICD-10-CM | POA: Insufficient documentation

## 2013-10-17 DIAGNOSIS — G894 Chronic pain syndrome: Secondary | ICD-10-CM | POA: Insufficient documentation

## 2013-10-17 DIAGNOSIS — M1711 Unilateral primary osteoarthritis, right knee: Secondary | ICD-10-CM

## 2013-10-17 DIAGNOSIS — M1710 Unilateral primary osteoarthritis, unspecified knee: Secondary | ICD-10-CM | POA: Diagnosis not present

## 2013-10-17 DIAGNOSIS — M5136 Other intervertebral disc degeneration, lumbar region: Secondary | ICD-10-CM

## 2013-10-17 DIAGNOSIS — M76899 Other specified enthesopathies of unspecified lower limb, excluding foot: Secondary | ICD-10-CM

## 2013-10-17 DIAGNOSIS — IMO0002 Reserved for concepts with insufficient information to code with codable children: Secondary | ICD-10-CM

## 2013-10-17 DIAGNOSIS — M549 Dorsalgia, unspecified: Secondary | ICD-10-CM | POA: Diagnosis not present

## 2013-10-17 DIAGNOSIS — M5137 Other intervertebral disc degeneration, lumbosacral region: Secondary | ICD-10-CM | POA: Diagnosis not present

## 2013-10-17 DIAGNOSIS — M47817 Spondylosis without myelopathy or radiculopathy, lumbosacral region: Secondary | ICD-10-CM

## 2013-10-17 DIAGNOSIS — R209 Unspecified disturbances of skin sensation: Secondary | ICD-10-CM

## 2013-10-17 DIAGNOSIS — M1712 Unilateral primary osteoarthritis, left knee: Secondary | ICD-10-CM | POA: Insufficient documentation

## 2013-10-17 DIAGNOSIS — Z79899 Other long term (current) drug therapy: Secondary | ICD-10-CM

## 2013-10-17 DIAGNOSIS — M7062 Trochanteric bursitis, left hip: Secondary | ICD-10-CM

## 2013-10-17 DIAGNOSIS — Z5181 Encounter for therapeutic drug level monitoring: Secondary | ICD-10-CM

## 2013-10-17 MED ORDER — OXYCODONE-ACETAMINOPHEN 5-325 MG PO TABS: 1.0000 | ORAL_TABLET | Freq: Four times a day (QID) | ORAL | Status: DC | PRN

## 2013-10-17 NOTE — Patient Instructions (Signed)
Please let me know if you have an injection performed at the orthopedic office  Also let me know if he decides you want a spine injection, we will need to order an MRI of the lumbar spine first

## 2013-10-17 NOTE — Progress Notes (Signed)
Subjective:    Patient ID: Albert Stanley, male    DOB: Nov 14, 1945, 68 y.o.   MRN: [id number]  HPI Low back pain and left hip pain that radiates to foot, plans to see orthopedics tomorrow for this  Sometimes when he is sitting in his recliner he feels a burning pain down his left leg. No numbness or tingling.  We have previously discussed that this may be a trochanteric bursitis but he does not want a trochanteric bursa injection because of years about steroids increasing blood sugars.  Patient also claims that oxycodone is not helpful for him without acetaminophen however he cannot take generic oxycodone plus over the counter acetaminophen. He insists on Percocet Will check urine drug screen. None performed since initial   Pain Inventory Average Pain 7 Pain Right Now 7 My pain is sharp and aching  In the last 24 hours, has pain interfered with the following? General activity 7 Relation with others 7 Enjoyment of life 7 What TIME of day is your pain at its worst? morning and night Sleep (in general) Fair  Pain is worse with: walking and standing Pain improves with: rest and medication Relief from Meds: 9  Mobility walk without assistance use a cane how many minutes can you walk? 10 ability to climb steps?  yes do you drive?  yes transfers alone Do you have any goals in this area?  yes  Function not employed: date last employed 09/1999 retired Do you have any goals in this area?  yes  Neuro/Psych trouble walking  Prior Studies Any changes since last visit?  no  Physicians involved in your care Any changes since last visit?  no   Family History  Problem Relation Age of Onset  . Arthritis Mother   . Cancer Father   . Heart disease Father   . Hypertension Sister   . Alcohol abuse Brother   . Diabetes Daughter    History   Social History  . Marital Status: Married    Spouse Name: N/A    Number of Children: N/A  . Years of Education: N/A   Social  History Main Topics  . Smoking status: Former Research scientist (life sciences)  . Smokeless tobacco: Never Used  . Alcohol Use: None  . Drug Use: None  . Sexual Activity: None   Other Topics Concern  . None   Social History Narrative  . None   Past Surgical History  Procedure Laterality Date  . Back surgery      2 times  . Joint replacement      hip   Past Medical History  Diagnosis Date  . Hypertension   . Diabetes mellitus without complication   . Asthma   . Hyperlipidemia    BP 144/81  Pulse 97  Resp 16  Ht 5\' 10"  (1.778 m)  Wt 252 lb (114.306 kg)  BMI 36.16 kg/m2  SpO2 97%     Review of Systems  Constitutional: Positive for fever, chills and unexpected weight change.  Gastrointestinal: Positive for abdominal pain.  Musculoskeletal: Positive for back pain, gait problem and neck pain.  All other systems reviewed and are negative.       Objective:   Physical Exam Ernest palpation lumbar paraspinal muscles L5-S1 Tenderness left greater trochanter Negative straight leg raising Normal strength in both hip flexors knee extensors ankle dorsiflexors Deep tendon reflexes normal at the knees and absent at the ankles Left knee valgus deformity       Assessment & Plan:  Impression #1 Left greater trochanter bursitis discussed injection, is following up with orthopedic to reevaluate   2. Lumbar degenerative disc L2-L3 as well as L4-L5 Possible radiculopathy. Continue oxycodone 5/325 mg 4 times per day Given unusual description of requiring Percocet, will recheck urine drug screen  3. Myofascial pain right gluteus medius improved  4. Lumbar spondylosis may benefit from medial branch blocks once again patient is reluctant since he would need to hold coumadin for several days

## 2013-10-18 DIAGNOSIS — Z96649 Presence of unspecified artificial hip joint: Secondary | ICD-10-CM | POA: Diagnosis not present

## 2013-10-18 DIAGNOSIS — M5137 Other intervertebral disc degeneration, lumbosacral region: Secondary | ICD-10-CM | POA: Diagnosis not present

## 2013-10-18 DIAGNOSIS — M47817 Spondylosis without myelopathy or radiculopathy, lumbosacral region: Secondary | ICD-10-CM | POA: Diagnosis not present

## 2013-10-18 DIAGNOSIS — M549 Dorsalgia, unspecified: Secondary | ICD-10-CM | POA: Diagnosis not present

## 2013-10-18 DIAGNOSIS — Z79899 Other long term (current) drug therapy: Secondary | ICD-10-CM | POA: Diagnosis not present

## 2013-10-18 DIAGNOSIS — Z5181 Encounter for therapeutic drug level monitoring: Secondary | ICD-10-CM | POA: Diagnosis not present

## 2013-10-18 DIAGNOSIS — M25559 Pain in unspecified hip: Secondary | ICD-10-CM | POA: Diagnosis not present

## 2013-10-18 DIAGNOSIS — M1710 Unilateral primary osteoarthritis, unspecified knee: Secondary | ICD-10-CM | POA: Diagnosis not present

## 2013-10-18 DIAGNOSIS — Z471 Aftercare following joint replacement surgery: Secondary | ICD-10-CM | POA: Diagnosis not present

## 2013-10-18 DIAGNOSIS — IMO0002 Reserved for concepts with insufficient information to code with codable children: Secondary | ICD-10-CM | POA: Diagnosis not present

## 2013-10-26 DIAGNOSIS — Z86718 Personal history of other venous thrombosis and embolism: Secondary | ICD-10-CM | POA: Diagnosis not present

## 2013-10-26 DIAGNOSIS — M1710 Unilateral primary osteoarthritis, unspecified knee: Secondary | ICD-10-CM | POA: Diagnosis not present

## 2013-10-26 DIAGNOSIS — Z79899 Other long term (current) drug therapy: Secondary | ICD-10-CM | POA: Diagnosis not present

## 2013-10-31 DIAGNOSIS — M25559 Pain in unspecified hip: Secondary | ICD-10-CM | POA: Diagnosis not present

## 2013-11-01 DIAGNOSIS — M25559 Pain in unspecified hip: Secondary | ICD-10-CM | POA: Diagnosis not present

## 2013-11-03 DIAGNOSIS — M25559 Pain in unspecified hip: Secondary | ICD-10-CM | POA: Diagnosis not present

## 2013-11-07 DIAGNOSIS — M25559 Pain in unspecified hip: Secondary | ICD-10-CM | POA: Diagnosis not present

## 2013-11-08 DIAGNOSIS — M25559 Pain in unspecified hip: Secondary | ICD-10-CM | POA: Diagnosis not present

## 2013-11-10 DIAGNOSIS — M25559 Pain in unspecified hip: Secondary | ICD-10-CM | POA: Diagnosis not present

## 2013-11-16 ENCOUNTER — Encounter: Payer: Self-pay | Admitting: Physical Medicine & Rehabilitation

## 2013-11-16 ENCOUNTER — Encounter: Payer: Medicare Other | Attending: Physical Medicine & Rehabilitation

## 2013-11-16 ENCOUNTER — Ambulatory Visit (HOSPITAL_BASED_OUTPATIENT_CLINIC_OR_DEPARTMENT_OTHER): Payer: Medicare Other | Admitting: Physical Medicine & Rehabilitation

## 2013-11-16 VITALS — BP 150/81 | HR 95 | Resp 16 | Ht 70.0 in | Wt 252.0 lb

## 2013-11-16 DIAGNOSIS — M545 Low back pain, unspecified: Secondary | ICD-10-CM | POA: Diagnosis not present

## 2013-11-16 DIAGNOSIS — G894 Chronic pain syndrome: Secondary | ICD-10-CM | POA: Insufficient documentation

## 2013-11-16 DIAGNOSIS — Z862 Personal history of diseases of the blood and blood-forming organs and certain disorders involving the immune mechanism: Secondary | ICD-10-CM | POA: Diagnosis not present

## 2013-11-16 DIAGNOSIS — M25579 Pain in unspecified ankle and joints of unspecified foot: Secondary | ICD-10-CM | POA: Diagnosis not present

## 2013-11-16 DIAGNOSIS — M47817 Spondylosis without myelopathy or radiculopathy, lumbosacral region: Secondary | ICD-10-CM | POA: Diagnosis not present

## 2013-11-16 DIAGNOSIS — Z8639 Personal history of other endocrine, nutritional and metabolic disease: Secondary | ICD-10-CM | POA: Diagnosis not present

## 2013-11-16 DIAGNOSIS — Z8739 Personal history of other diseases of the musculoskeletal system and connective tissue: Secondary | ICD-10-CM

## 2013-11-16 DIAGNOSIS — M25569 Pain in unspecified knee: Secondary | ICD-10-CM | POA: Diagnosis not present

## 2013-11-16 DIAGNOSIS — IMO0001 Reserved for inherently not codable concepts without codable children: Secondary | ICD-10-CM

## 2013-11-16 MED ORDER — OXYCODONE-ACETAMINOPHEN 5-325 MG PO TABS: 1.0000 | ORAL_TABLET | Freq: Four times a day (QID) | ORAL | Status: DC | PRN

## 2013-11-16 MED ORDER — GABAPENTIN 100 MG PO CAPS: 100.0000 mg | ORAL_CAPSULE | Freq: Every day | ORAL | Status: DC

## 2013-11-16 NOTE — Patient Instructions (Addendum)
Gabapentin capsules or tablets What is this medicine? GABAPENTIN (GA ba pen tin) is used to control partial seizures in adults with epilepsy. It is also used to treat certain types of nerve pain. This medicine may be used for other purposes; ask your health care provider or pharmacist if you have questions. COMMON BRAND NAME(S): Orpha Bur , Neurontin What should I tell my health care provider before I take this medicine? They need to know if you have any of these conditions: -kidney disease -suicidal thoughts, plans, or attempt; a previous suicide attempt by you or a family member -an unusual or allergic reaction to gabapentin, other medicines, foods, dyes, or preservatives -pregnant or trying to get pregnant -breast-feeding How should I use this medicine? Take this medicine by mouth with a glass of water. Follow the directions on the prescription label. You can take it with or without food. If it upsets your stomach, take it with food.Take your medicine at regular intervals. Do not take it more often than directed. Do not stop taking except on your doctor's advice. If you are directed to break the 600 or 800 mg tablets in half as part of your dose, the extra half tablet should be used for the next dose. If you have not used the extra half tablet within 28 days, it should be thrown away. A special MedGuide will be given to you by the pharmacist with each prescription and refill. Be sure to read this information carefully each time. Talk to your pediatrician regarding the use of this medicine in children. Special care may be needed. Overdosage: If you think you have taken too much of this medicine contact a poison control center or emergency room at once. NOTE: This medicine is only for you. Do not share this medicine with others. What if I miss a dose? If you miss a dose, take it as soon as you can. If it is almost time for your next dose, take only that dose. Do not take double or extra  doses. What may interact with this medicine? Do not take this medicine with any of the following medications: -other gabapentin products This medicine may also interact with the following medications: -alcohol -antacids -antihistamines for allergy, cough and cold -certain medicines for anxiety or sleep -certain medicines for depression or psychotic disturbances -homatropine; hydrocodone -naproxen -narcotic medicines (opiates) for pain- may cause increased drowsiness -phenothiazines like chlorpromazine, mesoridazine, prochlorperazine, thioridazine This list may not describe all possible interactions. Give your health care provider a list of all the medicines, herbs, non-prescription drugs, or dietary supplements you use. Also tell them if you smoke, drink alcohol, or use illegal drugs. Some items may interact with your medicine. What should I watch for while using this medicine? Visit your doctor or health care professional for regular checks on your progress. You may want to keep a record at home of how you feel your condition is responding to treatment. You may want to share this information with your doctor or health care professional at each visit. You should contact your doctor or health care professional if your seizures get worse or if you have any new types of seizures. Do not stop taking this medicine or any of your seizure medicines unless instructed by your doctor or health care professional. Stopping your medicine suddenly can increase your seizures or their severity. Wear a medical identification bracelet or chain if you are taking this medicine for seizures, and carry a card that lists all your medications. You may get  drowsy, dizzy, or have blurred vision. Do not drive, use machinery, or do anything that needs mental alertness until you know how this medicine affects you. To reduce dizzy or fainting spells, do not sit or stand up quickly, especially if you are an older patient.  Alcohol can increase drowsiness and dizziness. Avoid alcoholic drinks. Your mouth may get dry. Chewing sugarless gum or sucking hard candy, and drinking plenty of water will help. The use of this medicine may increase the chance of suicidal thoughts or actions. Pay special attention to how you are responding while on this medicine. Any worsening of mood, or thoughts of suicide or dying should be reported to your health care professional right away. Women who become pregnant while using this medicine may enroll in the [place] Pregnancy Registry by calling [phone]. This registry collects information about the safety of antiepileptic drug use during pregnancy. What side effects may I notice from receiving this medicine? Side effects that you should report to your doctor or health care professional as soon as possible: -allergic reactions like skin rash, itching or hives, swelling of the face, lips, or tongue -worsening of mood, thoughts or actions of suicide or dying Side effects that usually do not require medical attention (report to your doctor or health care professional if they continue or are bothersome): -constipation -difficulty walking or controlling muscle movements -dizziness -nausea -slurred speech -tiredness -tremors -weight gain This list may not describe all possible side effects. Call your doctor for medical advice about side effects. You may report side effects to FDA at 1-800-FDA-1088. Where should I keep my medicine? Keep out of reach of children. Store at room temperature between 15 and 30 degrees C (59 and 86 degrees F). Throw away any unused medicine after the expiration date. NOTE: This sheet is a summary. It may not cover all possible information. If you have questions about this medicine, talk to your doctor, pharmacist, or health care provider.  2014, Elsevier/Gold Standard. (2013-06-01 09:12:48)    trigger point injection today with  lidocaine 1%. 2 ML's injected

## 2013-11-16 NOTE — Progress Notes (Signed)
Subjective:    Patient ID: Albert Stanley, male    DOB: [birth year], 68 y.o.   MRN: [id number]  HPI Trying to ride regular stationary did better with recumbent bike Complains of bilateral buttocks pain Also has a tingling or burning pain mainly at night on the left medial arch of the foot No trauma to the foot area. No swelling or redness noted. Patient does have a history of gout Pain Inventory Average Pain 4 Pain Right Now 9 My pain is sharp  In the last 24 hours, has pain interfered with the following? General activity 9 Relation with others 9 Enjoyment of life 9 What TIME of day is your pain at its worst? night Sleep (in general) Fair  Pain is worse with: walking, bending and standing Pain improves with: rest, therapy/exercise and medication Relief from Meds: 7  Mobility walk without assistance how many minutes can you walk? undetermined ability to climb steps?  yes do you drive?  yes transfers alone Do you have any goals in this area?  yes  Function retired Do you have any goals in this area?  yes  Neuro/Psych weakness trouble walking  Prior Studies Any changes since last visit?  yes  Physicians involved in your care Any changes since last visit?  no   Family History  Problem Relation Age of Onset  . Arthritis Mother   . Cancer Father   . Heart disease Father   . Hypertension Sister   . Alcohol abuse Brother   . Diabetes Daughter    History   Social History  . Marital Status: Married    Spouse Name: N/A    Number of Children: N/A  . Years of Education: N/A   Social History Main Topics  . Smoking status: Former Research scientist (life sciences)  . Smokeless tobacco: Never Used  . Alcohol Use: None  . Drug Use: None  . Sexual Activity: None   Other Topics Concern  . None   Social History Narrative  . None   Past Surgical History  Procedure Laterality Date  . Back surgery      2 times  . Joint replacement      hip   Past Medical History  Diagnosis Date   . Hypertension   . Diabetes mellitus without complication   . Asthma   . Hyperlipidemia    BP 150/81  Pulse 95  Resp 16  Ht 5\' 10"  (1.778 m)  Wt 252 lb (114.306 kg)  BMI 36.16 kg/m2  SpO2 97%  Opioid Risk Score:   Fall Risk Score: Moderate Fall Risk (6-13 points) (patient educated handout given)   Review of Systems  Constitutional: Positive for fever and chills.  Cardiovascular: Positive for leg swelling.  Musculoskeletal: Positive for back pain and gait problem.  Neurological: Positive for weakness.  All other systems reviewed and are negative.       Objective:   Physical Exam  palpation lumbar paraspinal muscles L5-S1  Tenderness left greater trochanter  Negative straight leg raising  Normal strength in both hip flexors knee extensors ankle dorsiflexors  Deep tendon reflexes normal at the knees and absent at the ankles  Left knee valgus deformity No erythema or swelling in the left foot There is pain with light palpation.      Assessment & Plan:  1. Lumbar degenerative disc L2-L3 as well as L4-L5  Possible radiculopathy. Continue oxycodone 5/325 mg 4 times per day   3. Myofascial pain bilateral gluteus medius   Trigger Point  Injection  Indication: Bilateral gluteus medius Myofascial pain not relieved by medication management and other conservative care.  Informed consent was obtained after describing risk and benefits of the procedure with the patient, this includes bleeding, bruising, infection and medication side effects.  The patient wishes to proceed and has given written consent.  The patient was placed in a prone position.  The R and L glut medius area was marked and prepped with Betadine.  It was entered with a 25-gauge 1-1/2 inch needle and 1 mL of 1% lidocaine was injected into each of 2 trigger points, after negative draw back for blood.  The patient tolerated the procedure well.  Post procedure instructions were given.  3. Left medial foot pain  appears to be neurogenic, no visible signs of gout at this point however this could be early stages. We'll start gabapentin and if this worsens may need to call in a Medrol dose

## 2013-11-22 DIAGNOSIS — N183 Chronic kidney disease, stage 3 unspecified: Secondary | ICD-10-CM | POA: Diagnosis not present

## 2013-12-01 DIAGNOSIS — I82409 Acute embolism and thrombosis of unspecified deep veins of unspecified lower extremity: Secondary | ICD-10-CM | POA: Diagnosis not present

## 2013-12-05 DIAGNOSIS — N183 Chronic kidney disease, stage 3 unspecified: Secondary | ICD-10-CM | POA: Diagnosis not present

## 2013-12-05 DIAGNOSIS — I10 Essential (primary) hypertension: Secondary | ICD-10-CM | POA: Diagnosis not present

## 2013-12-13 DIAGNOSIS — I82409 Acute embolism and thrombosis of unspecified deep veins of unspecified lower extremity: Secondary | ICD-10-CM | POA: Diagnosis not present

## 2013-12-14 ENCOUNTER — Ambulatory Visit: Payer: Medicare Other | Admitting: Physical Medicine & Rehabilitation

## 2013-12-14 ENCOUNTER — Encounter: Payer: Self-pay | Admitting: Physical Medicine and Rehabilitation

## 2013-12-14 ENCOUNTER — Encounter
Payer: Medicare Other | Attending: Physical Medicine and Rehabilitation | Admitting: Physical Medicine and Rehabilitation

## 2013-12-14 ENCOUNTER — Other Ambulatory Visit: Payer: Self-pay

## 2013-12-14 VITALS — BP 151/72 | HR 102 | Resp 14 | Ht 70.0 in | Wt 254.0 lb

## 2013-12-14 DIAGNOSIS — M545 Low back pain, unspecified: Secondary | ICD-10-CM | POA: Diagnosis not present

## 2013-12-14 DIAGNOSIS — M5136 Other intervertebral disc degeneration, lumbar region: Secondary | ICD-10-CM

## 2013-12-14 DIAGNOSIS — J45909 Unspecified asthma, uncomplicated: Secondary | ICD-10-CM | POA: Diagnosis not present

## 2013-12-14 DIAGNOSIS — I10 Essential (primary) hypertension: Secondary | ICD-10-CM | POA: Insufficient documentation

## 2013-12-14 DIAGNOSIS — IMO0002 Reserved for concepts with insufficient information to code with codable children: Secondary | ICD-10-CM | POA: Diagnosis not present

## 2013-12-14 DIAGNOSIS — Z5181 Encounter for therapeutic drug level monitoring: Secondary | ICD-10-CM | POA: Diagnosis not present

## 2013-12-14 DIAGNOSIS — M51379 Other intervertebral disc degeneration, lumbosacral region without mention of lumbar back pain or lower extremity pain: Secondary | ICD-10-CM | POA: Insufficient documentation

## 2013-12-14 DIAGNOSIS — M5137 Other intervertebral disc degeneration, lumbosacral region: Secondary | ICD-10-CM | POA: Diagnosis not present

## 2013-12-14 DIAGNOSIS — E785 Hyperlipidemia, unspecified: Secondary | ICD-10-CM | POA: Diagnosis not present

## 2013-12-14 DIAGNOSIS — E119 Type 2 diabetes mellitus without complications: Secondary | ICD-10-CM | POA: Diagnosis not present

## 2013-12-14 DIAGNOSIS — M1712 Unilateral primary osteoarthritis, left knee: Secondary | ICD-10-CM

## 2013-12-14 DIAGNOSIS — M1710 Unilateral primary osteoarthritis, unspecified knee: Secondary | ICD-10-CM | POA: Insufficient documentation

## 2013-12-14 MED ORDER — OXYCODONE-ACETAMINOPHEN 5-325 MG PO TABS: 1.0000 | ORAL_TABLET | Freq: Four times a day (QID) | ORAL | Status: DC | PRN

## 2013-12-14 NOTE — Progress Notes (Signed)
Subjective:    Patient ID: Albert Stanley, male    DOB: [birth year], 68 y.o.   MRN: [id number]  HPI Albert Stanley  is a 68 year old male who returns for follow up on LBP, left medial foot pain as well as left trochanter and buttock pain. Low back pain occasionally radiates to upper thighs. He reports that trigger point injections last time provided minimal relief as they wore off after two days. He is hesitant any steroid injections that may affect his blood sugars.    He has had to reschedule his appointment with Dr. Maureen Ralphs due to weather and plans on following up with him regarding left trochanteric pain. Left knee and left foot pain is not an issue today. He has not used neurontin after reading the  SE profile and is very hesitant about starting it. Left foot pain has resolved with use of neuragin cream.     Pain Inventory Average Pain 7 Pain Right Now 9 My pain is sharp and aching  In the last 24 hours, has pain interfered with the following? General activity 9 Relation with others 9 Enjoyment of life 10 What TIME of day is your pain at its worst? morning and night Sleep (in general) Fair  Pain is worse with: walking, bending and standing Pain improves with: medication Relief from Meds: 9  Mobility walk without assistance how many minutes can you walk? 10 ability to climb steps?  yes do you drive?  yes Do you have any goals in this area?  yes  Function retired Do you have any goals in this area?  yes  Neuro/Psych trouble walking  Prior Studies Any changes since last visit?  no  Physicians involved in your care Dr Delfina Redwood   Family History  Problem Relation Age of Onset  . Arthritis Mother   . Cancer Father   . Heart disease Father   . Hypertension Sister   . Alcohol abuse Brother   . Diabetes Daughter    History   Social History  . Marital Status: Married    Spouse Name: N/A    Number of Children: N/A  . Years of Education: N/A   Social History  Main Topics  . Smoking status: Former Research scientist (life sciences)  . Smokeless tobacco: Never Used  . Alcohol Use: None  . Drug Use: None  . Sexual Activity: None   Other Topics Concern  . None   Social History Narrative  . None   Past Surgical History  Procedure Laterality Date  . Back surgery      2 times  . Joint replacement      hip   Past Medical History  Diagnosis Date  . Hypertension   . Diabetes mellitus without complication   . Asthma   . Hyperlipidemia    BP 151/72  Pulse 102  Resp 14  Ht 5\' 10"  (1.778 m)  Wt 254 lb (115.214 kg)  BMI 36.45 kg/m2  SpO2 98%  Opioid Risk Score:   Fall Risk Score: Moderate Fall Risk (6-13 points) (patient educated handout declined)   Review of Systems  Constitutional: Positive for chills and diaphoresis.  Gastrointestinal: Positive for constipation.  Musculoskeletal: Positive for back pain and gait problem.  All other systems reviewed and are negative.       Objective:   Physical Exam  Nursing note and vitals reviewed. Constitutional: He is oriented to person, place, and time. He appears well-developed and well-nourished.  HENT:  Head: Normocephalic and atraumatic.  Eyes: Conjunctivae are normal. Pupils are equal, round, and reactive to light.  Neck: Normal range of motion. Neck supple.  Cardiovascular: Normal rate and regular rhythm.   Pulmonary/Chest: Effort normal and breath sounds normal. No respiratory distress. He has no wheezes.  Abdominal: Soft. Bowel sounds are normal.  Musculoskeletal: He exhibits no edema.  Tenderness to palpation lumbar paraspinal muscles L5-S1 and left  greater trochanter. Left knee with valgus deformity. Negative straight leg raising.  5/5 strength in bilateral hip flexors, knee extensors, ankle dorsiflexors.  DTRs normal at the knees and absent at the ankles      Neurological: He is alert and oriented to person, place, and time.  Skin: Skin is warm and dry.          Assessment & Plan:  1.  Lumbar degenerative disc L2-L3 as well as L4-L5 with possible radiculopathy. Continue oxycodone qid. Also encouraged trial of neurontin as this would help with radiculopathy symptoms. Discussed MOA, SE as well as  benefits of medication. He is to follow up with Dr. Letta Pate in a month to discuss MBB.  Refilled: Oxycodone 5/325 mg # 120 pills--use one pill  4 times per day   3. Myofascial pain bilateral gluteus medius: Advised trial of analgesic cream to the area.

## 2013-12-14 NOTE — Telephone Encounter (Signed)
Erroneous encounter

## 2013-12-28 DIAGNOSIS — I82409 Acute embolism and thrombosis of unspecified deep veins of unspecified lower extremity: Secondary | ICD-10-CM | POA: Diagnosis not present

## 2014-01-11 ENCOUNTER — Encounter: Payer: Self-pay | Admitting: Physical Medicine & Rehabilitation

## 2014-01-11 ENCOUNTER — Ambulatory Visit (HOSPITAL_BASED_OUTPATIENT_CLINIC_OR_DEPARTMENT_OTHER): Payer: Medicare Other | Admitting: Physical Medicine & Rehabilitation

## 2014-01-11 ENCOUNTER — Encounter: Payer: Medicare Other | Attending: Physical Medicine & Rehabilitation

## 2014-01-11 VITALS — BP 131/78 | HR 92 | Resp 14 | Ht 70.0 in | Wt 254.0 lb

## 2014-01-11 DIAGNOSIS — M545 Low back pain, unspecified: Secondary | ICD-10-CM | POA: Diagnosis not present

## 2014-01-11 DIAGNOSIS — M25569 Pain in unspecified knee: Secondary | ICD-10-CM | POA: Diagnosis not present

## 2014-01-11 DIAGNOSIS — M5137 Other intervertebral disc degeneration, lumbosacral region: Secondary | ICD-10-CM

## 2014-01-11 DIAGNOSIS — M47817 Spondylosis without myelopathy or radiculopathy, lumbosacral region: Secondary | ICD-10-CM

## 2014-01-11 DIAGNOSIS — IMO0001 Reserved for inherently not codable concepts without codable children: Secondary | ICD-10-CM

## 2014-01-11 DIAGNOSIS — M5136 Other intervertebral disc degeneration, lumbar region: Secondary | ICD-10-CM

## 2014-01-11 DIAGNOSIS — G894 Chronic pain syndrome: Secondary | ICD-10-CM | POA: Insufficient documentation

## 2014-01-11 MED ORDER — OXYCODONE-ACETAMINOPHEN 5-325 MG PO TABS: 1.0000 | ORAL_TABLET | Freq: Four times a day (QID) | ORAL | Status: DC | PRN

## 2014-01-11 NOTE — Patient Instructions (Signed)
If you decide on lumbar injections will need MRI and the ok from PCP to stop warfarin for 5 days

## 2014-01-11 NOTE — Progress Notes (Signed)
Subjective:    Patient ID: Albert Stanley, male    DOB: [birth year], 68 y.o.   MRN: [id number]  HPI Appt with Ortho to discuss hip pain Complains of left buttocks pain. Head temporary relief with left gluteus medius trigger point injection No falls no new complaints  discussed potential pain generator  Reviewed lumbar x-rays as well as left knee x-ray No problems with compliance in regards to his pain   Pain Inventory Average Pain 9 Pain Right Now 7 My pain is burning and aching  In the last 24 hours, has pain interfered with the following? General activity 9 Relation with others 7 Enjoyment of life 7 What TIME of day is your pain at its worst? morning evening and night Sleep (in general) Fair  Pain is worse with: walking, bending and standing Pain improves with: rest and medication Relief from Meds: 8  Mobility walk without assistance how many minutes can you walk? 5-10 ability to climb steps?  yes do you drive?  yes transfers alone Do you have any goals in this area?  yes  Function retired Do you have any goals in this area?  yes  Neuro/Psych weakness tingling  Prior Studies Any changes since last visit?  no CLINICAL DATA: Right-sided low back pain extending to the right  hip.  EXAM:  LUMBAR SPINE - 2-3 VIEW  COMPARISON: 12/23/2009  FINDINGS:  There is mild disc space narrowing and L1-2. There is progression of  degenerative disc disease at L2-3 with further loss of disc height  and endplate sclerosis. There is mild disc space narrowing at L3-4.  There is chronic degenerative disc disease at L4-5 with complete  loss of disc space. There is progressive degenerative disc disease  at L5-S1 with more loss of disc space and endplate sclerosis. Lower  lumbar facet degeneration is again noted.  IMPRESSION:  Progressive degenerative disc disease at L2-3 and L5-S1 since the  study of 2011. Chronic degenerative changes at other levels most  pronounced at  L4-5. Certainly, the findings could relate to back  pain or radicular pain.  Electronically Signed  By: Nelson Chimes M.D.  On: 07/14/2013 15:54  LEFT KNEE - 1-2 VIEW  COMPARISON: None.  FINDINGS:  No visible joint effusion. No significant joint space narrowing. No  osteophytes. No loose bodies. No evidence of fracture.  Atherosclerotic change of the arteries in the region incidentally  noted.  IMPRESSION:  No primary knee joint pathology evident.  Electronically Signed  By: Nelson Chimes M.D.  On: 07/14/2013 15:55  Physicians involved in your care Dr Delfina Redwood   Family History  Problem Relation Age of Onset  . Arthritis Mother   . Cancer Father   . Heart disease Father   . Hypertension Sister   . Alcohol abuse Brother   . Diabetes Daughter    History   Social History  . Marital Status: Married    Spouse Name: N/A    Number of Children: N/A  . Years of Education: N/A   Social History Main Topics  . Smoking status: Former Research scientist (life sciences)  . Smokeless tobacco: Never Used  . Alcohol Use: None  . Drug Use: None  . Sexual Activity: None   Other Topics Concern  . None   Social History Narrative  . None   Past Surgical History  Procedure Laterality Date  . Back surgery      2 times  . Joint replacement      hip   Past Medical History  Diagnosis Date  . Hypertension   . Diabetes mellitus without complication   . Asthma   . Hyperlipidemia    BP 131/78  Pulse 92  Resp 14  Ht 5\' 10"  (1.778 m)  Wt 254 lb (115.214 kg)  BMI 36.45 kg/m2  SpO2 97%  Opioid Risk Score:   Fall Risk Score: Moderate Fall Risk (6-13 points) (patient educated handout declined)   Review of Systems  Constitutional: Positive for chills and diaphoresis.  Gastrointestinal: Positive for abdominal pain and constipation.  Neurological: Positive for weakness.  All other systems reviewed and are negative.       Objective:   Physical Exam  Constitutional: He is oriented to person, place, and  time.  Neurological: He is alert and oriented to person, place, and time. He has normal strength.  Reflex Scores:      Patellar reflexes are 2+ on the right side and 2+ on the left side.      Achilles reflexes are 1+ on the right side and 1+ on the left side.  Under this over the trochanteric bursa. Tenderness over the left gluteus medius area No pain with hip internal and external rotation. Decreased lumbar flexion and extension as well as lateral bending.        Assessment & Plan:  Impression #1 Left greater trochanter bursitis improved is following up with orthopedic to reevaluate hip pain  2. Lumbar degenerative disc L2-L3 as well as L4-L5 Possible radiculopathy. Continue oxycodone 5/325 mg 4 times per day Continue opioid monitoring program. This consists of regular clinic visits, examinations, urine drug screen, pill counts as well as use of [place] controlled substance reporting System.  RTC 1 mo NP  3. Myofascial pain Left>right gluteus medius   4. Lumbar spondylosis may benefit from medial branch blocks once again patient is reluctant since he would need to hold coumadin for several days Also would like to get an MRI prior to any lumbar spine injections  #5. History of prostate disease, following up with urologist. Do not think his myofascial pain is related

## 2014-01-16 DIAGNOSIS — M25559 Pain in unspecified hip: Secondary | ICD-10-CM | POA: Diagnosis not present

## 2014-01-16 DIAGNOSIS — Z471 Aftercare following joint replacement surgery: Secondary | ICD-10-CM | POA: Diagnosis not present

## 2014-01-16 DIAGNOSIS — Z96649 Presence of unspecified artificial hip joint: Secondary | ICD-10-CM | POA: Diagnosis not present

## 2014-01-16 DIAGNOSIS — I82409 Acute embolism and thrombosis of unspecified deep veins of unspecified lower extremity: Secondary | ICD-10-CM | POA: Diagnosis not present

## 2014-02-01 DIAGNOSIS — R972 Elevated prostate specific antigen [PSA]: Secondary | ICD-10-CM | POA: Diagnosis not present

## 2014-02-06 ENCOUNTER — Other Ambulatory Visit: Payer: Self-pay | Admitting: Internal Medicine

## 2014-02-06 DIAGNOSIS — I10 Essential (primary) hypertension: Secondary | ICD-10-CM | POA: Diagnosis not present

## 2014-02-06 DIAGNOSIS — I82409 Acute embolism and thrombosis of unspecified deep veins of unspecified lower extremity: Secondary | ICD-10-CM | POA: Diagnosis not present

## 2014-02-06 DIAGNOSIS — R609 Edema, unspecified: Secondary | ICD-10-CM

## 2014-02-06 DIAGNOSIS — E1129 Type 2 diabetes mellitus with other diabetic kidney complication: Secondary | ICD-10-CM | POA: Diagnosis not present

## 2014-02-06 DIAGNOSIS — N183 Chronic kidney disease, stage 3 unspecified: Secondary | ICD-10-CM | POA: Diagnosis not present

## 2014-02-06 DIAGNOSIS — M545 Low back pain, unspecified: Secondary | ICD-10-CM | POA: Diagnosis not present

## 2014-02-06 DIAGNOSIS — E785 Hyperlipidemia, unspecified: Secondary | ICD-10-CM | POA: Diagnosis not present

## 2014-02-06 DIAGNOSIS — M109 Gout, unspecified: Secondary | ICD-10-CM | POA: Diagnosis not present

## 2014-02-08 ENCOUNTER — Ambulatory Visit: Payer: Medicare Other | Admitting: Registered Nurse

## 2014-02-08 DIAGNOSIS — R972 Elevated prostate specific antigen [PSA]: Secondary | ICD-10-CM | POA: Diagnosis not present

## 2014-02-08 DIAGNOSIS — C679 Malignant neoplasm of bladder, unspecified: Secondary | ICD-10-CM | POA: Diagnosis not present

## 2014-02-12 ENCOUNTER — Encounter: Payer: Medicare Other | Attending: Physical Medicine and Rehabilitation | Admitting: Registered Nurse

## 2014-02-12 ENCOUNTER — Encounter: Payer: Self-pay | Admitting: Registered Nurse

## 2014-02-12 VITALS — BP 150/91 | HR 97 | Resp 14 | Ht 70.0 in | Wt 252.0 lb

## 2014-02-12 DIAGNOSIS — M51379 Other intervertebral disc degeneration, lumbosacral region without mention of lumbar back pain or lower extremity pain: Secondary | ICD-10-CM | POA: Diagnosis not present

## 2014-02-12 DIAGNOSIS — M5137 Other intervertebral disc degeneration, lumbosacral region: Secondary | ICD-10-CM | POA: Insufficient documentation

## 2014-02-12 DIAGNOSIS — E119 Type 2 diabetes mellitus without complications: Secondary | ICD-10-CM | POA: Insufficient documentation

## 2014-02-12 DIAGNOSIS — J45909 Unspecified asthma, uncomplicated: Secondary | ICD-10-CM | POA: Diagnosis not present

## 2014-02-12 DIAGNOSIS — M5136 Other intervertebral disc degeneration, lumbar region: Secondary | ICD-10-CM

## 2014-02-12 DIAGNOSIS — I10 Essential (primary) hypertension: Secondary | ICD-10-CM | POA: Diagnosis not present

## 2014-02-12 DIAGNOSIS — IMO0001 Reserved for inherently not codable concepts without codable children: Secondary | ICD-10-CM

## 2014-02-12 DIAGNOSIS — I82409 Acute embolism and thrombosis of unspecified deep veins of unspecified lower extremity: Secondary | ICD-10-CM | POA: Diagnosis not present

## 2014-02-12 DIAGNOSIS — IMO0002 Reserved for concepts with insufficient information to code with codable children: Secondary | ICD-10-CM | POA: Diagnosis not present

## 2014-02-12 DIAGNOSIS — M545 Low back pain, unspecified: Secondary | ICD-10-CM | POA: Insufficient documentation

## 2014-02-12 DIAGNOSIS — M47817 Spondylosis without myelopathy or radiculopathy, lumbosacral region: Secondary | ICD-10-CM | POA: Diagnosis not present

## 2014-02-12 DIAGNOSIS — Z5181 Encounter for therapeutic drug level monitoring: Secondary | ICD-10-CM

## 2014-02-12 DIAGNOSIS — E785 Hyperlipidemia, unspecified: Secondary | ICD-10-CM | POA: Diagnosis not present

## 2014-02-12 DIAGNOSIS — Z79899 Other long term (current) drug therapy: Secondary | ICD-10-CM

## 2014-02-12 DIAGNOSIS — M1710 Unilateral primary osteoarthritis, unspecified knee: Secondary | ICD-10-CM | POA: Insufficient documentation

## 2014-02-12 MED ORDER — OXYCODONE-ACETAMINOPHEN 5-325 MG PO TABS: 1.0000 | ORAL_TABLET | Freq: Four times a day (QID) | ORAL | Status: DC | PRN

## 2014-02-12 NOTE — Progress Notes (Signed)
Subjective:    Patient ID: Albert Stanley, male    DOB: [birth year], 68 y.o.   MRN: [id number]  HPI: [name] is a 68 year old male who returns for follow up for chronic pain and medication refill. He says his pain is in his lower back and left hip. Occasionally the pain in his left hip radiates to his ankle. He rates his pain 8. He currently has no back pain at this moment. His current exercise regime is walking short distances with frequent rest periods. He performs cycling on stationary back, and will be joining the [facility].  Pain Inventory Average Pain 8 Pain Right Now 8 My pain is sharp and aching  In the last 24 hours, has pain interfered with the following? General activity 5 Relation with others 5 Enjoyment of life 6 What TIME of day is your pain at its worst? morning and night Sleep (in general) Fair  Pain is worse with: bending and standing Pain improves with: rest, heat/ice, therapy/exercise and medication Relief from Meds: 8  Mobility walk without assistance how many minutes can you walk? 10-20 ability to climb steps?  yes do you drive?  yes transfers alone  Function retired Do you have any goals in this area?  yes  Neuro/Psych tingling  Prior Studies Any changes since last visit?  no  Physicians involved in your care Any changes since last visit?  no   Family History  Problem Relation Age of Onset  . Arthritis Mother   . Cancer Father   . Heart disease Father   . Hypertension Sister   . Alcohol abuse Brother   . Diabetes Daughter    History   Social History  . Marital Status: Married    Spouse Name: N/A    Number of Children: N/A  . Years of Education: N/A   Social History Main Topics  . Smoking status: Former Research scientist (life sciences)  . Smokeless tobacco: Never Used  . Alcohol Use: None  . Drug Use: None  . Sexual Activity: None   Other Topics Concern  . None   Social History Narrative  . None   Past Surgical History    Procedure Laterality Date  . Back surgery      2 times  . Joint replacement      hip   Past Medical History  Diagnosis Date  . Hypertension   . Diabetes mellitus without complication   . Asthma   . Hyperlipidemia    BP 150/91  Pulse 97  Resp 14  Ht 5\' 10"  (1.778 m)  Wt 252 lb (114.306 kg)  BMI 36.16 kg/m2  SpO2 98%  Opioid Risk Score:   Fall Risk Score: Moderate Fall Risk (6-13 points) (patient educated handout declined)   Review of Systems  Constitutional: Positive for diaphoresis and appetite change.  Musculoskeletal: Positive for back pain.  Neurological:       Tingling  All other systems reviewed and are negative.      Objective:   Physical Exam  Nursing note and vitals reviewed. Constitutional: He is oriented to person, place, and time. He appears well-developed and well-nourished.  Neck: Normal range of motion. Neck supple.  Cardiovascular: Normal rate, regular rhythm and normal heart sounds.   Pulmonary/Chest: Effort normal and breath sounds normal.  Musculoskeletal:  Normal Muscle Bulk: Muscle Testing Reveals: Muscle Strength Upper and Lower Extremities 5/5. Back without spinal or paraspinal tenderness. Spine flexion:90 degrees Walks with Narrow Based gait.  Neurological: He is alert and oriented to person, place, and time.  Skin: Skin is warm and dry.  Psychiatric: He has a normal mood and affect.          Assessment & Plan:  1.Left greater trochanter bursitis : He will call [facility] Regarding a Cream he had previously.  2. Lumbar degenerative disc L2-L3 as well as L4-L5  Refilled:oxyCODONE 5/325mg  one tablet every 6 hours as needed. #120  3. Myofascial Pain: Continue Current Medication regime. Will be starting exercise Program at [facility] this month.   20 minutes of face to face patient care time was spent during this visit. All questions were encouraged and answered.

## 2014-02-21 DIAGNOSIS — I82409 Acute embolism and thrombosis of unspecified deep veins of unspecified lower extremity: Secondary | ICD-10-CM | POA: Diagnosis not present

## 2014-02-23 ENCOUNTER — Ambulatory Visit
Admission: RE | Admit: 2014-02-23 | Discharge: 2014-02-23 | Disposition: A | Discharge status: Home / Self Care | Payer: Medicare Other | Source: Ambulatory Visit | Attending: Internal Medicine | Admitting: Internal Medicine

## 2014-02-23 DIAGNOSIS — I824Z9 Acute embolism and thrombosis of unspecified deep veins of unspecified distal lower extremity: Secondary | ICD-10-CM | POA: Diagnosis not present

## 2014-02-23 DIAGNOSIS — I824Y9 Acute embolism and thrombosis of unspecified deep veins of unspecified proximal lower extremity: Secondary | ICD-10-CM | POA: Diagnosis not present

## 2014-02-23 DIAGNOSIS — R609 Edema, unspecified: Secondary | ICD-10-CM

## 2014-03-12 ENCOUNTER — Encounter: Payer: Self-pay | Admitting: Registered Nurse

## 2014-03-12 ENCOUNTER — Encounter: Payer: Medicare Other | Attending: Physical Medicine and Rehabilitation | Admitting: Registered Nurse

## 2014-03-12 VITALS — BP 147/82 | HR 87 | Resp 16 | Ht 70.0 in | Wt 253.0 lb

## 2014-03-12 DIAGNOSIS — Z79899 Other long term (current) drug therapy: Secondary | ICD-10-CM | POA: Diagnosis not present

## 2014-03-12 DIAGNOSIS — M51379 Other intervertebral disc degeneration, lumbosacral region without mention of lumbar back pain or lower extremity pain: Secondary | ICD-10-CM | POA: Diagnosis not present

## 2014-03-12 DIAGNOSIS — IMO0002 Reserved for concepts with insufficient information to code with codable children: Secondary | ICD-10-CM | POA: Insufficient documentation

## 2014-03-12 DIAGNOSIS — M5137 Other intervertebral disc degeneration, lumbosacral region: Secondary | ICD-10-CM | POA: Insufficient documentation

## 2014-03-12 DIAGNOSIS — J45909 Unspecified asthma, uncomplicated: Secondary | ICD-10-CM | POA: Diagnosis not present

## 2014-03-12 DIAGNOSIS — I10 Essential (primary) hypertension: Secondary | ICD-10-CM | POA: Insufficient documentation

## 2014-03-12 DIAGNOSIS — M1710 Unilateral primary osteoarthritis, unspecified knee: Secondary | ICD-10-CM | POA: Insufficient documentation

## 2014-03-12 DIAGNOSIS — E785 Hyperlipidemia, unspecified: Secondary | ICD-10-CM | POA: Diagnosis not present

## 2014-03-12 DIAGNOSIS — M545 Low back pain, unspecified: Secondary | ICD-10-CM | POA: Diagnosis not present

## 2014-03-12 DIAGNOSIS — M47817 Spondylosis without myelopathy or radiculopathy, lumbosacral region: Secondary | ICD-10-CM | POA: Diagnosis not present

## 2014-03-12 DIAGNOSIS — IMO0001 Reserved for inherently not codable concepts without codable children: Secondary | ICD-10-CM

## 2014-03-12 DIAGNOSIS — Z5181 Encounter for therapeutic drug level monitoring: Secondary | ICD-10-CM

## 2014-03-12 DIAGNOSIS — E119 Type 2 diabetes mellitus without complications: Secondary | ICD-10-CM | POA: Insufficient documentation

## 2014-03-12 DIAGNOSIS — M51369 Other intervertebral disc degeneration, lumbar region without mention of lumbar back pain or lower extremity pain: Secondary | ICD-10-CM

## 2014-03-12 DIAGNOSIS — M5136 Other intervertebral disc degeneration, lumbar region: Secondary | ICD-10-CM

## 2014-03-12 MED ORDER — OXYCODONE-ACETAMINOPHEN 5-325 MG PO TABS: 1.0000 | ORAL_TABLET | Freq: Four times a day (QID) | ORAL | Status: DC | PRN

## 2014-03-12 NOTE — Progress Notes (Signed)
Subjective:    Patient ID: Albert Stanley, male    DOB: 1945-12-30, 68 y.o.   MRN: [id number]  HPI: [name] is a 68 year old male who returns for follow up for chronic pain and medication refill. He says his pain is located in his lower back, left knee and left hip. He rates his pain 8. His current exercise regime is using his [employer] at his home three times a week.  Pain Inventory Average Pain 5 Pain Right Now 8 My pain is sharp and aching  In the last 24 hours, has pain interfered with the following? General activity 4 Relation with others 6 Enjoyment of life 6 What TIME of day is your pain at its worst? morning and night Sleep (in general) Fair  Pain is worse with: walking, bending, standing and some activites Pain improves with: rest, therapy/exercise and medication Relief from Meds: 7  Mobility walk without assistance use a cane how many minutes can you walk? 10 ability to climb steps?  yes do you drive?  yes transfers alone Do you have any goals in this area?  yes  Function retired  Neuro/Psych No problems in this area  Prior Studies Any changes since last visit?  no  Physicians involved in your care Any changes since last visit?  no   Family History  Problem Relation Age of Onset  . Arthritis Mother   . Cancer Father   . Heart disease Father   . Hypertension Sister   . Alcohol abuse Brother   . Diabetes Daughter    History   Social History  . Marital Status: Married    Spouse Name: N/A    Number of Children: N/A  . Years of Education: N/A   Social History Main Topics  . Smoking status: Former Research scientist (life sciences)  . Smokeless tobacco: Never Used  . Alcohol Use: None  . Drug Use: None  . Sexual Activity: None   Other Topics Concern  . None   Social History Narrative  . None   Past Surgical History  Procedure Laterality Date  . Back surgery      2 times  . Joint replacement      hip   Past Medical History  Diagnosis Date    . Hypertension   . Diabetes mellitus without complication   . Asthma   . Hyperlipidemia    BP 147/82  Pulse 87  Resp 16  Ht 5\' 10"  (1.778 m)  Wt 253 lb (114.76 kg)  BMI 36.30 kg/m2  SpO2 97%  Opioid Risk Score:   Fall Risk Score: Moderate Fall Risk (6-13 points) (patient educated handout declined)   Review of Systems  Constitutional: Positive for chills and diaphoresis.  Cardiovascular: Positive for leg swelling.  Musculoskeletal: Positive for back pain.  All other systems reviewed and are negative.      Objective:   Physical Exam  Nursing note and vitals reviewed. Constitutional: He is oriented to person, place, and time. He appears well-developed and well-nourished.  HENT:  Head: Normocephalic and atraumatic.  Neck: Normal range of motion. Neck supple.  Cardiovascular: Normal rate, regular rhythm and normal heart sounds.   Pulmonary/Chest: Effort normal and breath sounds normal.  Musculoskeletal:  Normal Muscle Bulk and Muscle Testing Reveals: Upper Extremities with Full ROM and Muscle Strength 5/5. Spine Flexion 90 degrees Lumbar Paraspinal Tenderness Noted: L-4- L-5 Lower Extremities: Right Lower extremity with Full ROM and Muscle Strength 5/5. Left Lower Extremity Flexion Produces  Pain into Patella. No swelling or tenderness noted with palpation. Arises from chair with ease. Narrow based gait.  Neurological: He is alert and oriented to person, place, and time.  Skin: Skin is warm and dry.  Psychiatric: He has a normal mood and affect.          Assessment & Plan:  1.Left greater trochanter bursitis : Continue with current medication regime. Continue with exercise and Heat Therapy 2. Lumbar degenerative disc L2-L3 as well as L4-L5  Refilled:oxyCODONE 5/325mg  one tablet every 6 hours as needed. #120  3. Myofascial Pain: Continue Current Medication regime.   15 minutes of face to face patient care time was spent during this visit. All questions were  encouraged and answered.  F/U in 1 month.

## 2014-03-26 DIAGNOSIS — I82409 Acute embolism and thrombosis of unspecified deep veins of unspecified lower extremity: Secondary | ICD-10-CM | POA: Diagnosis not present

## 2014-04-12 ENCOUNTER — Encounter: Payer: Medicare Other | Attending: Physical Medicine & Rehabilitation

## 2014-04-12 ENCOUNTER — Encounter: Payer: Self-pay | Admitting: Physical Medicine & Rehabilitation

## 2014-04-12 ENCOUNTER — Ambulatory Visit (HOSPITAL_BASED_OUTPATIENT_CLINIC_OR_DEPARTMENT_OTHER): Payer: Medicare Other | Admitting: Physical Medicine & Rehabilitation

## 2014-04-12 VITALS — BP 157/87 | HR 101 | Resp 14 | Wt 254.0 lb

## 2014-04-12 DIAGNOSIS — M25569 Pain in unspecified knee: Secondary | ICD-10-CM | POA: Insufficient documentation

## 2014-04-12 DIAGNOSIS — G894 Chronic pain syndrome: Secondary | ICD-10-CM | POA: Insufficient documentation

## 2014-04-12 DIAGNOSIS — M545 Low back pain, unspecified: Secondary | ICD-10-CM | POA: Diagnosis not present

## 2014-04-12 DIAGNOSIS — IMO0001 Reserved for inherently not codable concepts without codable children: Secondary | ICD-10-CM | POA: Diagnosis not present

## 2014-04-12 DIAGNOSIS — M5137 Other intervertebral disc degeneration, lumbosacral region: Secondary | ICD-10-CM | POA: Diagnosis not present

## 2014-04-12 DIAGNOSIS — M47817 Spondylosis without myelopathy or radiculopathy, lumbosacral region: Secondary | ICD-10-CM

## 2014-04-12 DIAGNOSIS — M5136 Other intervertebral disc degeneration, lumbar region: Secondary | ICD-10-CM

## 2014-04-12 MED ORDER — OXYCODONE-ACETAMINOPHEN 5-325 MG PO TABS: 1.0000 | ORAL_TABLET | Freq: Four times a day (QID) | ORAL | Status: DC | PRN

## 2014-04-12 NOTE — Patient Instructions (Signed)
Keep exercising!

## 2014-04-12 NOTE — Progress Notes (Signed)
Subjective:    Patient ID: Albert Stanley, male    DOB: 10-01-1946, 68 y.o.   MRN: [id number]  HPI Saw Dr Maureen Ralphs for Left pain, discussed options, including surgery Pain when arising out of chair  No falls Works out with stretching, walking .5 mi, then weights and elliptical, recumbent cycle Pain Inventory Average Pain 7 Pain Right Now 5 My pain is intermittent, sharp, burning and aching  In the last 24 hours, has pain interfered with the following? General activity 6 Relation with others 4 Enjoyment of life 4 What TIME of day is your pain at its worst? morning and night Sleep (in general) Fair  Pain is worse with: some activites Pain improves with: medication Relief from Meds: 7  Mobility walk without assistance how many minutes can you walk? 30-45 ability to climb steps?  yes do you drive?  yes  Function retired  Neuro/Psych tingling loss of taste or smell  Prior Studies Any changes since last visit?  no  Physicians involved in your care Any changes since last visit?  no   Family History  Problem Relation Age of Onset  . Arthritis Mother   . Cancer Father   . Heart disease Father   . Hypertension Sister   . Alcohol abuse Brother   . Diabetes Daughter    History   Social History  . Marital Status: Married    Spouse Name: N/A    Number of Children: N/A  . Years of Education: N/A   Social History Main Topics  . Smoking status: Former Research scientist (life sciences)  . Smokeless tobacco: Never Used  . Alcohol Use: None  . Drug Use: None  . Sexual Activity: None   Other Topics Concern  . None   Social History Narrative  . None   Past Surgical History  Procedure Laterality Date  . Back surgery      2 times  . Joint replacement      hip   Past Medical History  Diagnosis Date  . Hypertension   . Diabetes mellitus without complication   . Asthma   . Hyperlipidemia    BP 157/87  Pulse 101  Resp 14  Wt 254 lb (115.214 kg)  SpO2 96%  Opioid Risk  Score:   Fall Risk Score: Moderate Fall Risk (6-13 points) (previously educated and declined handout for fall prevention tips for the home)  Review of Systems  Constitutional: Positive for chills.  HENT:       Loss of taste or smell  Endocrine:       Blood sugar variations  Musculoskeletal: Positive for back pain.  All other systems reviewed and are negative.      Objective:   Physical Exam  Good ROM R hip and knee No tenderness over greater troch Lumbar tenderness on RIght  5/5 strength in bilateral hip flexors knee extensors ankle dorsiflexors Negative SLR GEN NAD      Assessment & Plan:   1. Lumbar degenerative disc L2-L3 as well as L4-L5  Possible radiculopathy. Continue oxycodone 5/325 mg 4 times per day  Continue opioid monitoring program. This consists of regular clinic visits, examinations, urine drug screen, pill counts as well as use of [place] controlled substance reporting System.  RTC 1 mo NP  3. Myofascial pain Left>right gluteus medius  4. Lumbar spondylosis may benefit from medial branch blocks once again patient is reluctant since he would need to hold coumadin for several days  Also would like to get an  MRI prior to any lumbar spine injections

## 2014-04-26 DIAGNOSIS — I82409 Acute embolism and thrombosis of unspecified deep veins of unspecified lower extremity: Secondary | ICD-10-CM | POA: Diagnosis not present

## 2014-05-11 ENCOUNTER — Encounter: Payer: Medicare Other | Attending: Physical Medicine and Rehabilitation | Admitting: Registered Nurse

## 2014-05-11 ENCOUNTER — Encounter: Payer: Self-pay | Admitting: Registered Nurse

## 2014-05-11 VITALS — BP 140/82 | HR 104 | Resp 16 | Ht 70.0 in | Wt 255.0 lb

## 2014-05-11 DIAGNOSIS — M5136 Other intervertebral disc degeneration, lumbar region: Secondary | ICD-10-CM

## 2014-05-11 DIAGNOSIS — M25519 Pain in unspecified shoulder: Secondary | ICD-10-CM | POA: Diagnosis not present

## 2014-05-11 DIAGNOSIS — M5137 Other intervertebral disc degeneration, lumbosacral region: Secondary | ICD-10-CM

## 2014-05-11 DIAGNOSIS — M47817 Spondylosis without myelopathy or radiculopathy, lumbosacral region: Secondary | ICD-10-CM | POA: Diagnosis not present

## 2014-05-11 DIAGNOSIS — IMO0001 Reserved for inherently not codable concepts without codable children: Secondary | ICD-10-CM | POA: Diagnosis not present

## 2014-05-11 DIAGNOSIS — Z79899 Other long term (current) drug therapy: Secondary | ICD-10-CM | POA: Diagnosis not present

## 2014-05-11 DIAGNOSIS — M25512 Pain in left shoulder: Secondary | ICD-10-CM

## 2014-05-11 DIAGNOSIS — Z5181 Encounter for therapeutic drug level monitoring: Secondary | ICD-10-CM

## 2014-05-11 MED ORDER — OXYCODONE-ACETAMINOPHEN 5-325 MG PO TABS: 1.0000 | ORAL_TABLET | Freq: Four times a day (QID) | ORAL | Status: DC | PRN

## 2014-05-11 NOTE — Progress Notes (Signed)
Subjective:    Patient ID: Albert Stanley, male    DOB: 12-07-1945, 68 y.o.   MRN: [id number]  HPI: [name] is a 68 year old male who returns for follow up for chronic pain and medication refill. He says his pain is located in his left shoulder and  lower back. He rates his pain 5. His usual exercise regime is going to the [employer] three times a week, using the recumbant bicycle, elliptical, walking and using weight machine. Two weeks ago he started having left shoulder pain that has intensified over the last two weeks. He denies any falls or traumas. He's requesting an x-ray. This has been ordered.  Pain Inventory Average Pain 5 Pain Right Now 5 My pain is sharp, burning and aching  In the last 24 hours, has pain interfered with the following? General activity 6 Relation with others 4 Enjoyment of life 4 What TIME of day is your pain at its worst? morning and night Sleep (in general) Fair  Pain is worse with: walking and standing Pain improves with: rest, therapy/exercise and medication Relief from Meds: 7  Mobility walk without assistance how many minutes can you walk? 10-15 ability to climb steps?  yes do you drive?  yes transfers alone Do you have any goals in this area?  yes  Function retired Do you have any goals in this area?  yes  Neuro/Psych trouble walking  Prior Studies Any changes since last visit?  no  Physicians involved in your care Any changes since last visit?  no   Family History  Problem Relation Age of Onset  . Arthritis Mother   . Cancer Father   . Heart disease Father   . Hypertension Sister   . Alcohol abuse Brother   . Diabetes Daughter    History   Social History  . Marital Status: Married    Spouse Name: N/A    Number of Children: N/A  . Years of Education: N/A   Social History Main Topics  . Smoking status: Former Research scientist (life sciences)  . Smokeless tobacco: Never Used  . Alcohol Use: None  . Drug Use: None    . Sexual Activity: None   Other Topics Concern  . None   Social History Narrative  . None   Past Surgical History  Procedure Laterality Date  . Back surgery      2 times  . Joint replacement      hip   Past Medical History  Diagnosis Date  . Hypertension   . Diabetes mellitus without complication   . Asthma   . Hyperlipidemia    BP 140/82  Pulse 104  Resp 16  Ht 5\' 10"  (1.778 m)  Wt 255 lb (115.667 kg)  BMI 36.59 kg/m2  SpO2 98%  Opioid Risk Score:   Fall Risk Score: Moderate Fall Risk (6-13 points) (patient educated handout declined)   Review of Systems  Gastrointestinal: Positive for constipation.  Musculoskeletal: Positive for gait problem.  All other systems reviewed and are negative.      Objective:   Physical Exam  Nursing note and vitals reviewed. Constitutional: He is oriented to person, place, and time. He appears well-developed and well-nourished.  HENT:  Head: Normocephalic and atraumatic.  Neck: Normal range of motion. Neck supple.  Cervical Paraspinal Tenderness: C-3- C-5  Cardiovascular: Normal rate and regular rhythm.   Pulmonary/Chest: Effort normal and breath sounds normal.  Musculoskeletal:  Normal Muscle Bulk and Muscle Testing Reveals:  Upper Extremities: Full ROM and Muscle Strength 5/5 Decrease  ROM with extension posteriorly Spinal Forward Flexion: 80 Degrees and Extension 20 degrees Lumbar Paraspinal Tenderness: L-3- L-5 Mainly Right Side Lower Extremities: Full ROM and Muscle Strength 5/5 Arises from Chair with Ease Narrow Based Gait  Neurological: He is alert and oriented to person, place, and time.  Skin: Skin is warm and dry.  Psychiatric: He has a normal mood and affect.          Assessment & Plan:  1. Lumbar degenerative disc L2-L3 as well as L4-L5  Refilled:oxyCODONE 5/325mg  one tablet every 6 hours as needed. #120  2. Left Shoulder Pain/? Impingement: RX: Left Shoulder X-ray: Heat and Exercise Therapy. 3.  Myofascial Pain: Continue Current Medication regime.   20 minutes of face to face patient care time was spent during this visit. All questions were encouraged and answered.   F/U in 1 month

## 2014-05-15 ENCOUNTER — Ambulatory Visit
Admission: RE | Admit: 2014-05-15 | Discharge: 2014-05-15 | Disposition: A | Discharge status: Home / Self Care | Payer: Medicare Other | Source: Ambulatory Visit | Attending: Registered Nurse | Admitting: Registered Nurse

## 2014-05-15 DIAGNOSIS — M25519 Pain in unspecified shoulder: Secondary | ICD-10-CM | POA: Diagnosis not present

## 2014-05-17 ENCOUNTER — Telehealth: Payer: Self-pay | Admitting: Registered Nurse

## 2014-05-17 NOTE — Telephone Encounter (Signed)
Called Albert Stanley and he was given his X-Ray results  He verbalizes understanding. X-ray : Negative

## 2014-05-20 DIAGNOSIS — S61209A Unspecified open wound of unspecified finger without damage to nail, initial encounter: Secondary | ICD-10-CM | POA: Diagnosis not present

## 2014-05-28 DIAGNOSIS — I82409 Acute embolism and thrombosis of unspecified deep veins of unspecified lower extremity: Secondary | ICD-10-CM | POA: Diagnosis not present

## 2014-05-31 DIAGNOSIS — E785 Hyperlipidemia, unspecified: Secondary | ICD-10-CM | POA: Diagnosis not present

## 2014-05-31 DIAGNOSIS — N183 Chronic kidney disease, stage 3 unspecified: Secondary | ICD-10-CM | POA: Diagnosis not present

## 2014-05-31 DIAGNOSIS — I10 Essential (primary) hypertension: Secondary | ICD-10-CM | POA: Diagnosis not present

## 2014-05-31 DIAGNOSIS — E1129 Type 2 diabetes mellitus with other diabetic kidney complication: Secondary | ICD-10-CM | POA: Diagnosis not present

## 2014-05-31 DIAGNOSIS — I82409 Acute embolism and thrombosis of unspecified deep veins of unspecified lower extremity: Secondary | ICD-10-CM | POA: Diagnosis not present

## 2014-06-01 DIAGNOSIS — I82409 Acute embolism and thrombosis of unspecified deep veins of unspecified lower extremity: Secondary | ICD-10-CM | POA: Diagnosis not present

## 2014-06-01 DIAGNOSIS — R799 Abnormal finding of blood chemistry, unspecified: Secondary | ICD-10-CM | POA: Diagnosis not present

## 2014-06-04 DIAGNOSIS — R799 Abnormal finding of blood chemistry, unspecified: Secondary | ICD-10-CM | POA: Diagnosis not present

## 2014-06-08 ENCOUNTER — Encounter: Payer: Self-pay | Admitting: Registered Nurse

## 2014-06-08 ENCOUNTER — Encounter: Payer: Medicare Other | Attending: Physical Medicine and Rehabilitation | Admitting: Registered Nurse

## 2014-06-08 VITALS — BP 151/89 | HR 102 | Resp 14 | Ht 70.0 in | Wt 252.0 lb

## 2014-06-08 DIAGNOSIS — M25519 Pain in unspecified shoulder: Secondary | ICD-10-CM | POA: Insufficient documentation

## 2014-06-08 DIAGNOSIS — Z5181 Encounter for therapeutic drug level monitoring: Secondary | ICD-10-CM

## 2014-06-08 DIAGNOSIS — M5137 Other intervertebral disc degeneration, lumbosacral region: Secondary | ICD-10-CM

## 2014-06-08 DIAGNOSIS — M5136 Other intervertebral disc degeneration, lumbar region: Secondary | ICD-10-CM

## 2014-06-08 DIAGNOSIS — IMO0001 Reserved for inherently not codable concepts without codable children: Secondary | ICD-10-CM | POA: Diagnosis not present

## 2014-06-08 DIAGNOSIS — M47817 Spondylosis without myelopathy or radiculopathy, lumbosacral region: Secondary | ICD-10-CM | POA: Diagnosis not present

## 2014-06-08 DIAGNOSIS — Z79899 Other long term (current) drug therapy: Secondary | ICD-10-CM

## 2014-06-08 MED ORDER — OXYCODONE-ACETAMINOPHEN 5-325 MG PO TABS: 1.0000 | ORAL_TABLET | Freq: Four times a day (QID) | ORAL | Status: DC | PRN

## 2014-06-08 NOTE — Progress Notes (Signed)
Subjective:    Patient ID: Albert Stanley, male    DOB: 1946/07/03, 68 y.o.   MRN: [id number] [name] is a 68 year old male who returns for follow up for chronic pain and medication refill. He says his pain is located in his left shoulder and lower back and it radiates laterally to lower extremity. He rates his pain 8. His  exercise regime is going to the [employer] three times a week, using the recumbant bicycle, elliptical, walking and using weight machine. Also has a [employer] Machine at his home using it three- four times a week. Blood pressure was 157/89 upon arrival to office re-checked 143/89, he says his PCP discontinued a few medications. He will call his PCP and let him know his blood pressure readings.  HPI:  Pain Inventory Average Pain 6 Pain Right Now 8 My pain is sharp, stabbing and aching  In the last 24 hours, has pain interfered with the following? General activity 7 Relation with others 7 Enjoyment of life 7 What TIME of day is your pain at its worst? morning, night Sleep (in general) Fair  Pain is worse with: walking and standing Pain improves with: rest and medication Relief from Meds: 6  Mobility walk without assistance how many minutes can you walk? 10 ability to climb steps?  yes do you drive?  yes transfers alone Do you have any goals in this area?  yes  Function retired  Neuro/Psych bowel control problems  Prior Studies Any changes since last visit?  no  Physicians involved in your care Any changes since last visit?  no   Family History  Problem Relation Age of Onset  . Arthritis Mother   . Cancer Father   . Heart disease Father   . Hypertension Sister   . Alcohol abuse Brother   . Diabetes Daughter    History   Social History  . Marital Status: Married    Spouse Name: N/A    Number of Children: N/A  . Years of Education: N/A   Social History Main Topics  . Smoking status: Former Research scientist (life sciences)  . Smokeless  tobacco: Never Used  . Alcohol Use: None  . Drug Use: None  . Sexual Activity: None   Other Topics Concern  . None   Social History Narrative  . None   Past Surgical History  Procedure Laterality Date  . Back surgery      2 times  . Joint replacement      hip   Past Medical History  Diagnosis Date  . Hypertension   . Diabetes mellitus without complication   . Asthma   . Hyperlipidemia    BP 151/89  Pulse 102  Resp 14  Ht 5\' 10"  (1.778 m)  Wt 252 lb (114.306 kg)  BMI 36.16 kg/m2  SpO2 97%  Opioid Risk Score:   Fall Risk Score:      Review of Systems  Constitutional: Positive for chills and diaphoresis.  Gastrointestinal: Positive for constipation.  Genitourinary:       Bowel control problems  Musculoskeletal: Positive for back pain.  All other systems reviewed and are negative.      Objective:   Physical Exam  Nursing note and vitals reviewed. Constitutional: He is oriented to person, place, and time. He appears well-developed and well-nourished.  HENT:  Head: Normocephalic and atraumatic.  Neck: Normal range of motion. Neck supple.  Cardiovascular: Normal rate and regular rhythm.   Pulmonary/Chest:  Effort normal and breath sounds normal.  Musculoskeletal:  Normal Muscle Bulk and Muscle testing Reveals: Upper Extremities: Full ROM and Muscle Strength 5/5 Decreased ROM with Left arm with Adduction and Internal Rotation Left AC Joint Tenderness Lumbar Paraspinal Tenderness: L-3- L-5 Lower Extremities: Full ROM and Muscle Strength 5/5 Arises from chair with ease Narrow Based gait  Neurological: He is alert and oriented to person, place, and time.  Skin: Skin is warm and dry.  Psychiatric: He has a normal mood and affect.          Assessment & Plan:  1. Lumbar degenerative disc L2-L3 as well as L4-L5  Refilled:oxyCODONE 5/325mg  one tablet every 6 hours as needed. #120  2. Left Shoulder Pain/? Impingement: Continue Heat and Exercise Therapy.  Will think about Lidocaine/ Cortisone Injection 3. Myofascial Pain: Continue Current Medication regime.   20 minutes of face to face patient care time was spent during this visit. All questions were encouraged and answered.   F/U in 1 month

## 2014-06-11 DIAGNOSIS — R799 Abnormal finding of blood chemistry, unspecified: Secondary | ICD-10-CM | POA: Diagnosis not present

## 2014-06-12 ENCOUNTER — Telehealth: Payer: Self-pay | Admitting: Physical Medicine & Rehabilitation

## 2014-06-12 NOTE — Telephone Encounter (Signed)
Error

## 2014-06-13 DIAGNOSIS — I10 Essential (primary) hypertension: Secondary | ICD-10-CM | POA: Diagnosis not present

## 2014-06-14 DIAGNOSIS — N179 Acute kidney failure, unspecified: Secondary | ICD-10-CM | POA: Diagnosis not present

## 2014-06-14 DIAGNOSIS — I10 Essential (primary) hypertension: Secondary | ICD-10-CM | POA: Diagnosis not present

## 2014-06-14 DIAGNOSIS — N184 Chronic kidney disease, stage 4 (severe): Secondary | ICD-10-CM | POA: Diagnosis not present

## 2014-06-20 DIAGNOSIS — N183 Chronic kidney disease, stage 3 unspecified: Secondary | ICD-10-CM | POA: Diagnosis not present

## 2014-06-20 DIAGNOSIS — I10 Essential (primary) hypertension: Secondary | ICD-10-CM | POA: Diagnosis not present

## 2014-06-25 DIAGNOSIS — I82409 Acute embolism and thrombosis of unspecified deep veins of unspecified lower extremity: Secondary | ICD-10-CM | POA: Diagnosis not present

## 2014-07-06 ENCOUNTER — Telehealth: Payer: Self-pay | Admitting: Registered Nurse

## 2014-07-06 ENCOUNTER — Encounter: Payer: Medicare Other | Attending: Physical Medicine and Rehabilitation | Admitting: Registered Nurse

## 2014-07-06 ENCOUNTER — Encounter: Payer: Self-pay | Admitting: Registered Nurse

## 2014-07-06 VITALS — BP 153/73 | HR 92 | Resp 14 | Ht 71.0 in | Wt 253.0 lb

## 2014-07-06 DIAGNOSIS — M25519 Pain in unspecified shoulder: Secondary | ICD-10-CM | POA: Insufficient documentation

## 2014-07-06 DIAGNOSIS — IMO0001 Reserved for inherently not codable concepts without codable children: Secondary | ICD-10-CM | POA: Diagnosis not present

## 2014-07-06 DIAGNOSIS — Z79899 Other long term (current) drug therapy: Secondary | ICD-10-CM

## 2014-07-06 DIAGNOSIS — Z5181 Encounter for therapeutic drug level monitoring: Secondary | ICD-10-CM

## 2014-07-06 DIAGNOSIS — M25512 Pain in left shoulder: Secondary | ICD-10-CM

## 2014-07-06 DIAGNOSIS — M47817 Spondylosis without myelopathy or radiculopathy, lumbosacral region: Secondary | ICD-10-CM

## 2014-07-06 DIAGNOSIS — M5137 Other intervertebral disc degeneration, lumbosacral region: Secondary | ICD-10-CM | POA: Diagnosis not present

## 2014-07-06 DIAGNOSIS — M5136 Other intervertebral disc degeneration, lumbar region: Secondary | ICD-10-CM

## 2014-07-06 MED ORDER — OXYCODONE-ACETAMINOPHEN 5-325 MG PO TABS: 1.0000 | ORAL_TABLET | Freq: Four times a day (QID) | ORAL | Status: DC | PRN

## 2014-07-06 NOTE — Progress Notes (Signed)
Subjective:    Patient ID: Albert Stanley, male    DOB: [birth year], 68 y.o.   MRN: [id number] HPI: [name] is a 68 year old male who returns for follow up for chronic pain and medication refill. He says his pain is located in his neck, left shoulder and lower back. He rates his pain 9. His exercise regime is using his [employer] Machine twice a week.  He says his left shoulder hurts all the time. Reiterated his X-ray was negative. He verbalizes understanding. Requesting a MRI will speak to Dr. Letta Pate and give him a call. He verbalizes understanding.   Pain Inventory Average Pain 7 Pain Right Now 9 My pain is constant, sharp, stabbing, tingling and aching  In the last 24 hours, has pain interfered with the following? General activity 7 Relation with others 6 Enjoyment of life 5 What TIME of day is your pain at its worst? morning, night Sleep (in general) Fair  Pain is worse with: walking, bending, standing and some activites Pain improves with: rest and medication Relief from Meds: 7  Mobility walk without assistance  Function retired  Neuro/Psych weakness tingling trouble walking  Prior Studies Any changes since last visit?  no  Physicians involved in your care Any changes since last visit?  no   Family History  Problem Relation Age of Onset  . Arthritis Mother   . Cancer Father   . Heart disease Father   . Hypertension Sister   . Alcohol abuse Brother   . Diabetes Daughter    History   Social History  . Marital Status: Married    Spouse Name: N/A    Number of Children: N/A  . Years of Education: N/A   Social History Main Topics  . Smoking status: Former Research scientist (life sciences)  . Smokeless tobacco: Never Used  . Alcohol Use: None  . Drug Use: None  . Sexual Activity: None   Other Topics Concern  . None   Social History Narrative  . None   Past Surgical History  Procedure Laterality Date  . Back surgery      2 times  . Joint replacement        hip   Past Medical History  Diagnosis Date  . Hypertension   . Diabetes mellitus without complication   . Asthma   . Hyperlipidemia    BP 153/73  Pulse 92  Resp 14  Ht 5\' 11"  (1.803 m)  Wt 253 lb (114.76 kg)  BMI 35.30 kg/m2  SpO2 98%  Opioid Risk Score:   Fall Risk Score: Low Fall Risk (0-5 points)   Review of Systems     Objective:   Physical Exam  Nursing note and vitals reviewed. Constitutional: He is oriented to person, place, and time. He appears well-developed and well-nourished.  HENT:  Head: Normocephalic and atraumatic.  Neck: Normal range of motion. Neck supple.  Cardiovascular: Normal rate and regular rhythm.   Pulmonary/Chest: Effort normal and breath sounds normal.  Musculoskeletal:  Normal Muscle Bulk and Muscle testing Reveals: Upper Extremities: Full ROM and Muscle Strength 5/5 Left Shoulder Extension Produces Pain into Left Shoulder ( Greater Tubercle) Lumbar Paraspinal Tenderness: L-3- L-5 Lower Extremities: Full ROM and Muscle Strength 5/5 Arises from chair with ease Narrow Based Gait  Neurological: He is alert and oriented to person, place, and time.  Skin: Skin is warm and dry.  Psychiatric: He has a normal mood and affect.  Assessment & Plan:  1. Lumbar degenerative disc L2-L3 as well as L4-L5  Refilled:oxyCODONE 5/325mg  one tablet every 6 hours as needed. #120  2. Left Shoulder Pain/? Impingement: Continue Heat and Exercise Therapy. Schedule for Lidocaine Injection  3. Myofascial Pain: Continue Current Medication regime.   20 minutes of face to face patient care time was spent during this visit. All questions were encouraged and answered.   F/U in 1 month

## 2014-07-18 NOTE — Telephone Encounter (Signed)
Close encounter 

## 2014-07-20 DIAGNOSIS — M7502 Adhesive capsulitis of left shoulder: Secondary | ICD-10-CM | POA: Diagnosis not present

## 2014-07-20 DIAGNOSIS — M7542 Impingement syndrome of left shoulder: Secondary | ICD-10-CM | POA: Diagnosis not present

## 2014-07-26 DIAGNOSIS — I82409 Acute embolism and thrombosis of unspecified deep veins of unspecified lower extremity: Secondary | ICD-10-CM | POA: Diagnosis not present

## 2014-08-02 ENCOUNTER — Other Ambulatory Visit: Payer: Self-pay | Admitting: *Deleted

## 2014-08-02 DIAGNOSIS — M7542 Impingement syndrome of left shoulder: Secondary | ICD-10-CM | POA: Diagnosis not present

## 2014-08-02 MED ORDER — OXYCODONE-ACETAMINOPHEN 5-325 MG PO TABS: 1.0000 | ORAL_TABLET | Freq: Four times a day (QID) | ORAL | Status: DC | PRN

## 2014-08-02 NOTE — Telephone Encounter (Signed)
Narcotic rx printed for MD to sign for RN med refill visit 

## 2014-08-03 ENCOUNTER — Encounter: Payer: Medicare Other | Admitting: Registered Nurse

## 2014-08-03 ENCOUNTER — Encounter: Payer: Medicare Other | Attending: Physical Medicine and Rehabilitation | Admitting: *Deleted

## 2014-08-03 VITALS — BP 148/77 | HR 83 | Resp 14 | Ht 70.75 in | Wt 249.0 lb

## 2014-08-03 DIAGNOSIS — Z760 Encounter for issue of repeat prescription: Secondary | ICD-10-CM | POA: Diagnosis not present

## 2014-08-03 DIAGNOSIS — Z5181 Encounter for therapeutic drug level monitoring: Secondary | ICD-10-CM

## 2014-08-03 DIAGNOSIS — M25512 Pain in left shoulder: Secondary | ICD-10-CM

## 2014-08-03 DIAGNOSIS — M47817 Spondylosis without myelopathy or radiculopathy, lumbosacral region: Secondary | ICD-10-CM

## 2014-08-03 NOTE — Progress Notes (Signed)
Here for pill count and medication refills.His shoulder is feeling better after starting PT yesterday.  His percocet 5/325 #120 Fill date 07/06/14    Today NV# 0   He actually should have approx 6 pills left.  Will review with him at next visit if short again.   Pain level:4  He will follow up in one month with NP.

## 2014-08-06 DIAGNOSIS — M7542 Impingement syndrome of left shoulder: Secondary | ICD-10-CM | POA: Diagnosis not present

## 2014-08-09 DIAGNOSIS — M7542 Impingement syndrome of left shoulder: Secondary | ICD-10-CM | POA: Diagnosis not present

## 2014-08-13 DIAGNOSIS — M7542 Impingement syndrome of left shoulder: Secondary | ICD-10-CM | POA: Diagnosis not present

## 2014-08-16 DIAGNOSIS — R3912 Poor urinary stream: Secondary | ICD-10-CM | POA: Diagnosis not present

## 2014-08-16 DIAGNOSIS — R972 Elevated prostate specific antigen [PSA]: Secondary | ICD-10-CM | POA: Diagnosis not present

## 2014-08-16 DIAGNOSIS — N401 Enlarged prostate with lower urinary tract symptoms: Secondary | ICD-10-CM | POA: Diagnosis not present

## 2014-08-16 DIAGNOSIS — Z8551 Personal history of malignant neoplasm of bladder: Secondary | ICD-10-CM | POA: Diagnosis not present

## 2014-08-16 DIAGNOSIS — Z125 Encounter for screening for malignant neoplasm of prostate: Secondary | ICD-10-CM | POA: Diagnosis not present

## 2014-08-17 DIAGNOSIS — M7542 Impingement syndrome of left shoulder: Secondary | ICD-10-CM | POA: Diagnosis not present

## 2014-08-20 DIAGNOSIS — M7542 Impingement syndrome of left shoulder: Secondary | ICD-10-CM | POA: Diagnosis not present

## 2014-08-20 DIAGNOSIS — M7502 Adhesive capsulitis of left shoulder: Secondary | ICD-10-CM | POA: Diagnosis not present

## 2014-08-23 DIAGNOSIS — I82409 Acute embolism and thrombosis of unspecified deep veins of unspecified lower extremity: Secondary | ICD-10-CM | POA: Diagnosis not present

## 2014-08-23 DIAGNOSIS — Z7901 Long term (current) use of anticoagulants: Secondary | ICD-10-CM | POA: Diagnosis not present

## 2014-08-23 DIAGNOSIS — Z86718 Personal history of other venous thrombosis and embolism: Secondary | ICD-10-CM | POA: Diagnosis not present

## 2014-08-30 ENCOUNTER — Other Ambulatory Visit: Payer: Self-pay | Admitting: Physical Medicine & Rehabilitation

## 2014-08-30 ENCOUNTER — Encounter: Payer: Self-pay | Admitting: Registered Nurse

## 2014-08-30 ENCOUNTER — Encounter: Payer: Medicare Other | Attending: Physical Medicine and Rehabilitation | Admitting: Registered Nurse

## 2014-08-30 VITALS — BP 177/76 | HR 88 | Resp 14 | Ht 71.0 in | Wt 250.0 lb

## 2014-08-30 DIAGNOSIS — G894 Chronic pain syndrome: Secondary | ICD-10-CM | POA: Diagnosis not present

## 2014-08-30 DIAGNOSIS — Z5181 Encounter for therapeutic drug level monitoring: Secondary | ICD-10-CM

## 2014-08-30 DIAGNOSIS — Z79899 Other long term (current) drug therapy: Secondary | ICD-10-CM

## 2014-08-30 DIAGNOSIS — M47817 Spondylosis without myelopathy or radiculopathy, lumbosacral region: Secondary | ICD-10-CM

## 2014-08-30 MED ORDER — OXYCODONE-ACETAMINOPHEN 5-325 MG PO TABS: 1.0000 | ORAL_TABLET | Freq: Four times a day (QID) | ORAL | Status: DC | PRN

## 2014-08-30 NOTE — Progress Notes (Signed)
Subjective:    Patient ID: Albert Stanley, male    DOB: [birth year], 68 y.o.   MRN: [id number]  HPI:[name] is a 68 year old male who returns for follow up for chronic pain and medication refill. He's complaining of generalized pain and left knee pain. He rates his pain 5. His current exercise regime is performing stretching exercises. He is short on his medications, he said the pharmacy dispensed him 120. I instructed him on a pill container and he verbalizes understanding. He says this is what he has done and had his wife to assist him. Educated on the importance of being complaint with his medication count he verbalizes understanding.  Pain Inventory Average Pain 5 Pain Right Now 5 My pain is sharp, burning, stabbing and aching  In the last 24 hours, has pain interfered with the following? General activity 7 Relation with others 5 Enjoyment of life 6 What TIME of day is your pain at its worst? morning,night Sleep (in general) Fair  Pain is worse with: no selection Pain improves with: medication Relief from Meds: 7  Mobility walk without assistance how many minutes can you walk? 10-15 ability to climb steps?  yes do you drive?  yes Do you have any goals in this area?  yes  Function retired  Neuro/Psych trouble walking  Prior Studies Any changes since last visit?  no  Physicians involved in your care Any changes since last visit?  no   Family History  Problem Relation Age of Onset  . Arthritis Mother   . Cancer Father   . Heart disease Father   . Hypertension Sister   . Alcohol abuse Brother   . Diabetes Daughter    History   Social History  . Marital Status: Married    Spouse Name: N/A    Number of Children: N/A  . Years of Education: N/A   Social History Main Topics  . Smoking status: Former Research scientist (life sciences)  . Smokeless tobacco: Never Used  . Alcohol Use: None  . Drug Use: None  . Sexual Activity: None   Other Topics Concern  . None    Social History Narrative   Past Surgical History  Procedure Laterality Date  . Back surgery      2 times  . Joint replacement      hip   Past Medical History  Diagnosis Date  . Hypertension   . Diabetes mellitus without complication   . Asthma   . Hyperlipidemia    BP 177/76 mmHg  Pulse 88  Resp 14  Ht 5\' 11"  (1.803 m)  Wt 250 lb (113.399 kg)  BMI 34.88 kg/m2  SpO2 96%  Opioid Risk Score:   Fall Risk Score: Low Fall Risk (0-5 points) Review of Systems  Constitutional: Positive for chills.       Night sweats  Cardiovascular: Positive for leg swelling.  Gastrointestinal: Positive for constipation.  Endocrine:       High blood sugar Low blood sugar  Hematological: Bruises/bleeds easily.  All other systems reviewed and are negative.      Objective:   Physical Exam  Constitutional: He is oriented to person, place, and time. He appears well-developed and well-nourished.  HENT:  Head: Normocephalic and atraumatic.  Neck: Normal range of motion. Neck supple.  Cardiovascular: Normal rate and regular rhythm.   Pulmonary/Chest: Effort normal and breath sounds normal.  Musculoskeletal:  Normal Muscle Bulk and Muscle Testing Reveals: Upper Extremities: Full ROM ans Muscle Strength  5/5 Back without Spinal or Paraspinal Tenderness Lower Extremities: Full ROM and Muscle Strength 5/5 Arises from chair with ease Narrow Based Gait  Neurological: He is alert and oriented to person, place, and time.  Skin: Skin is warm and dry.  Psychiatric: He has a normal mood and affect.  Nursing note and vitals reviewed.         Assessment & Plan:  1. Lumbar degenerative disc L2-L3 as well as L4-L5  Refilled:oxyCODONE 5/325mg  one tablet every 6 hours as needed. #120  2. Left Shoulder Pain Resolved: Completed Physical Therapy Continue Heat and Exercise Therapy. 3. Myofascial Pain: Continue Current Medication regime.  20 minutes of face to face patient care time was spent  during this visit. All questions were encouraged and answered.   F/U in 1 month

## 2014-08-31 LAB — PMP ALCOHOL METABOLITE (ETG)

## 2014-09-03 ENCOUNTER — Ambulatory Visit: Payer: Medicare Other | Admitting: Physical Medicine & Rehabilitation

## 2014-09-04 LAB — OXYCODONE, URINE (LC/MS-MS)
NOROXYCODONE, UR: 272 ng/mL (ref <50)
Oxycodone, ur: 114 ng/mL (ref <50)
Oxymorphone: 230 ng/mL (ref <50)

## 2014-09-04 LAB — ETHYL GLUCURONIDE, URINE
ETGU: 1912 ng/mL — AB (ref <500)
Ethyl Sulfate (ETS): 1598 ng/mL — ABNORMAL HIGH (ref <100)

## 2014-09-05 DIAGNOSIS — D649 Anemia, unspecified: Secondary | ICD-10-CM | POA: Diagnosis not present

## 2014-09-05 DIAGNOSIS — N183 Chronic kidney disease, stage 3 (moderate): Secondary | ICD-10-CM | POA: Diagnosis not present

## 2014-09-05 DIAGNOSIS — E213 Hyperparathyroidism, unspecified: Secondary | ICD-10-CM | POA: Diagnosis not present

## 2014-09-05 LAB — PRESCRIPTION MONITORING PROFILE (SOLSTAS)
Amphetamine/Meth: NEGATIVE ng/mL
BUPRENORPHINE, URINE: NEGATIVE ng/mL
Barbiturate Screen, Urine: NEGATIVE ng/mL
Benzodiazepine Screen, Urine: NEGATIVE ng/mL
CREATININE, URINE: 93.81 mg/dL (ref >20.0)
Cannabinoid Scrn, Ur: NEGATIVE ng/mL
Carisoprodol, Urine: NEGATIVE ng/mL
Cocaine Metabolites: NEGATIVE ng/mL
ECSTASY: NEGATIVE ng/mL
Fentanyl, Ur: NEGATIVE ng/mL
MEPERIDINE UR: NEGATIVE ng/mL
METHADONE SCREEN, URINE: NEGATIVE ng/mL
NITRITES URINE, INITIAL: NEGATIVE ug/mL
Opiate Screen, Urine: NEGATIVE ng/mL
Propoxyphene: NEGATIVE ng/mL
Tapentadol, urine: NEGATIVE ng/mL
Tramadol Scrn, Ur: NEGATIVE ng/mL
Zolpidem, Urine: NEGATIVE ng/mL
pH, Initial: 6.7 pH (ref 4.5–8.9)

## 2014-09-12 DIAGNOSIS — I10 Essential (primary) hypertension: Secondary | ICD-10-CM | POA: Diagnosis not present

## 2014-09-12 DIAGNOSIS — N184 Chronic kidney disease, stage 4 (severe): Secondary | ICD-10-CM | POA: Diagnosis not present

## 2014-09-19 NOTE — Progress Notes (Addendum)
Urine drug screen for this encounter is consistent for prescribed medication  Positive for ETOH per Dr Letta Pate review 10/16/14

## 2014-09-20 DIAGNOSIS — I82409 Acute embolism and thrombosis of unspecified deep veins of unspecified lower extremity: Secondary | ICD-10-CM | POA: Diagnosis not present

## 2014-09-24 ENCOUNTER — Telehealth: Payer: Self-pay | Admitting: *Deleted

## 2014-09-24 NOTE — Telephone Encounter (Signed)
Left message for Albert Stanley to call office so that we may discuss Dr Letta Pate recommendations

## 2014-09-24 NOTE — Telephone Encounter (Signed)
Pt complains of 'excruciating' back pain, he is asking to speak with Albert Stanley or Dr. Letta Pate about what they can do to help

## 2014-09-24 NOTE — Telephone Encounter (Signed)
We have discussed trigger point injection in the past , pt very resistant to any procedures  We can otherwise Try some muscle relaxers to manage the flare up for a couple weeks

## 2014-09-27 ENCOUNTER — Encounter: Payer: Medicare Other | Attending: Physical Medicine and Rehabilitation | Admitting: Registered Nurse

## 2014-09-27 ENCOUNTER — Encounter: Payer: Self-pay | Admitting: Registered Nurse

## 2014-09-27 VITALS — BP 182/80 | HR 98 | Resp 14

## 2014-09-27 DIAGNOSIS — Z5181 Encounter for therapeutic drug level monitoring: Secondary | ICD-10-CM | POA: Diagnosis present

## 2014-09-27 DIAGNOSIS — Z79899 Other long term (current) drug therapy: Secondary | ICD-10-CM | POA: Insufficient documentation

## 2014-09-27 DIAGNOSIS — M47817 Spondylosis without myelopathy or radiculopathy, lumbosacral region: Secondary | ICD-10-CM

## 2014-09-27 DIAGNOSIS — G894 Chronic pain syndrome: Secondary | ICD-10-CM | POA: Diagnosis not present

## 2014-09-27 MED ORDER — OXYCODONE-ACETAMINOPHEN 5-325 MG PO TABS: 1.0000 | ORAL_TABLET | Freq: Four times a day (QID) | ORAL | Status: DC | PRN

## 2014-09-27 MED ORDER — BACLOFEN 10 MG PO TABS: 10.0000 mg | ORAL_TABLET | Freq: Three times a day (TID) | ORAL | Status: DC | PRN

## 2014-09-27 NOTE — Progress Notes (Signed)
Subjective:    Patient ID: Albert Stanley, male    DOB: July 02, 1946, 68 y.o.   MRN: [id number]  HPI: Albert Stanley is a 68 year old male who returns for follow up for chronic pain and medication refill. He says his pain is located in his lower back. He rates his pain 9. His current exercise regime is using the [employer] three times a week. He says he called the office last Friday 09/21/14 he was having excruciating pain and no one called him back. I explained we called him on Monday and left a message. He was very upset we didn't call him back. Explain we try to return all calls and apologized for the delay. He verbalizes understanding. He also was given the manager card.  He says he laid around all weekend alternating heat and ice therapy in conjunction with medication regime. He was able to receive relief on Sunday 10/10/2014. His best friend passed away and he was trying to making it to his funeral last week, condolence offered.   He will speak to Dr. Florene Glen his nephrologist for approval for muscle relaxant due to his CKD, he verbalizes understanding. Arrived to office hypertensive BP 182/80 was rechecked 129/83.  Pain Inventory Average Pain 8 Pain Right Now 9 My pain is constant, sharp, stabbing, tingling and aching  In the last 24 hours, has pain interfered with the following? General activity 10 Relation with others 9 Enjoyment of life 10 What TIME of day is your pain at its worst? morning and night  Sleep (in general) Poor  Pain is worse with: walking, bending and standing Pain improves with: rest, heat/ice and medication Relief from Meds: 9  Mobility walk without assistance use a cane how many minutes can you walk?  not sure  Function retired  Neuro/Psych tingling trouble walking  Prior Studies Any changes since last visit?  no  Physicians involved in your care Any changes since last visit?  no   Family History  Problem Relation Age of Onset  .  Arthritis Mother   . Cancer Father   . Heart disease Father   . Hypertension Sister   . Alcohol abuse Brother   . Diabetes Daughter    History   Social History  . Marital Status: Married    Spouse Name: N/A    Number of Children: N/A  . Years of Education: N/A   Social History Main Topics  . Smoking status: Former Research scientist (life sciences)  . Smokeless tobacco: Never Used  . Alcohol Use: None  . Drug Use: None  . Sexual Activity: None   Other Topics Concern  . None   Social History Narrative   Past Surgical History  Procedure Laterality Date  . Back surgery      2 times  . Joint replacement      hip   Past Medical History  Diagnosis Date  . Hypertension   . Diabetes mellitus without complication   . Asthma   . Hyperlipidemia    BP 182/80 mmHg  Pulse 98  Resp 14  SpO2 99%  Opioid Risk Score:   Fall Risk Score:    Review of Systems  Constitutional: Negative.        Chills, easy bleeding  HENT: Negative.   Eyes: Negative.   Respiratory: Negative.   Cardiovascular: Negative.   Gastrointestinal: Negative.   Endocrine:       High/low blood sugar  Genitourinary: Negative.   Musculoskeletal: Positive for back pain,  joint swelling and arthralgias.  Skin: Negative.   Allergic/Immunologic: Negative.   Neurological: Negative.        Tingling, trouble walking  Hematological: Negative.   Psychiatric/Behavioral: Negative.        Objective:   Physical Exam  Constitutional: He is oriented to person, place, and time. He appears well-developed and well-nourished.  HENT:  Head: Normocephalic and atraumatic.  Neck: Normal range of motion. Neck supple.  Cardiovascular: Normal rate and regular rhythm.   Pulmonary/Chest: Effort normal and breath sounds normal.  Musculoskeletal:  Normal Muscle Bulk and Muscle Testing Reveals: Upper extremities: Full ROM and Muscle strength 5/5 Lumbar Paraspinal Tenderness: L-3- L-5 Lower Extremities: Full ROM and Muscle strength 5/5 Arises from  chair with ease Narrow based Gait  Neurological: He is alert and oriented to person, place, and time.  Skin: Skin is warm and dry.  Psychiatric: He has a normal mood and affect.  Nursing note and vitals reviewed.         Assessment & Plan:  1. Lumbar degenerative disc L2-L3 as well as L4-L5  Refilled:oxyCODONE 5/325mg  one tablet every 6 hours as needed. #120  2. Left Shoulder Pain Resolved: Completed Physical Therapy Continue Heat and Exercise Therapy. 3. Myofascial Pain: Continue Current Medication regime.  20 minutes of face to face patient care time was spent during this visit. All questions were encouraged and answered.   F/U in 1 month

## 2014-09-27 NOTE — Patient Instructions (Signed)
Use Baclofen for Muscle spasm  Take one tablet three times a day if needed.  09/27/14 take the Muscle spasm relaxant everyday for three days

## 2014-10-15 NOTE — Telephone Encounter (Signed)
Pt made an appt and will be seen 10/29/2014 11:45 am with Dr. Letta Pate

## 2014-10-19 ENCOUNTER — Telehealth: Payer: Self-pay | Admitting: *Deleted

## 2014-10-19 DIAGNOSIS — I82409 Acute embolism and thrombosis of unspecified deep veins of unspecified lower extremity: Secondary | ICD-10-CM | POA: Diagnosis not present

## 2014-10-19 MED ORDER — OXYCODONE-ACETAMINOPHEN 5-325 MG PO TABS: 1.0000 | ORAL_TABLET | Freq: Four times a day (QID) | ORAL | Status: DC | PRN

## 2014-10-19 NOTE — Telephone Encounter (Signed)
I spoke with Mr. Dom and explained the reason for his discharge and that his last prescription was ready for pick up. I explained he had to be here before 3pm to pick up his RX. RX given to [facility] for holding.

## 2014-10-19 NOTE — Telephone Encounter (Signed)
-----   Message from Charlett Blake, MD sent at 10/16/2014  1:47 PM EST ----- UDS positive for alcohol for the second time. He was warned about this in September 2014 yet has shown a positive again. We'll discharge

## 2014-10-19 NOTE — Telephone Encounter (Signed)
I have called and spoke with Albert Stanley about his positive ETOH in his urine drug screen and this is the second positive he has had.  He was counseled last positive test about this.  I have informed him that Dr Letta Pate is discharging him from the practice and we will issue him one last rx for 30 day supply of his medication. He is requesting to speak with office manager.  [place] notified.  Please call Albert Stanley

## 2014-10-29 ENCOUNTER — Ambulatory Visit: Payer: Medicare Other | Admitting: Physical Medicine & Rehabilitation

## 2014-11-01 DIAGNOSIS — I10 Essential (primary) hypertension: Secondary | ICD-10-CM | POA: Diagnosis not present

## 2014-11-01 DIAGNOSIS — Z0001 Encounter for general adult medical examination with abnormal findings: Secondary | ICD-10-CM | POA: Diagnosis not present

## 2014-11-01 DIAGNOSIS — D638 Anemia in other chronic diseases classified elsewhere: Secondary | ICD-10-CM | POA: Diagnosis not present

## 2014-11-01 DIAGNOSIS — I82409 Acute embolism and thrombosis of unspecified deep veins of unspecified lower extremity: Secondary | ICD-10-CM | POA: Diagnosis not present

## 2014-11-01 DIAGNOSIS — E1122 Type 2 diabetes mellitus with diabetic chronic kidney disease: Secondary | ICD-10-CM | POA: Diagnosis not present

## 2014-11-01 DIAGNOSIS — E663 Overweight: Secondary | ICD-10-CM | POA: Diagnosis not present

## 2014-11-01 DIAGNOSIS — N183 Chronic kidney disease, stage 3 (moderate): Secondary | ICD-10-CM | POA: Diagnosis not present

## 2014-11-01 DIAGNOSIS — Z1389 Encounter for screening for other disorder: Secondary | ICD-10-CM | POA: Diagnosis not present

## 2014-11-01 DIAGNOSIS — M109 Gout, unspecified: Secondary | ICD-10-CM | POA: Diagnosis not present

## 2014-11-01 DIAGNOSIS — Z6838 Body mass index (BMI) 38.0-38.9, adult: Secondary | ICD-10-CM | POA: Diagnosis not present

## 2014-11-01 DIAGNOSIS — M545 Low back pain: Secondary | ICD-10-CM | POA: Diagnosis not present

## 2014-11-01 DIAGNOSIS — E785 Hyperlipidemia, unspecified: Secondary | ICD-10-CM | POA: Diagnosis not present

## 2014-11-07 ENCOUNTER — Other Ambulatory Visit: Payer: Self-pay | Admitting: Orthopedic Surgery

## 2014-11-07 DIAGNOSIS — M7542 Impingement syndrome of left shoulder: Secondary | ICD-10-CM | POA: Diagnosis not present

## 2014-11-07 DIAGNOSIS — M4806 Spinal stenosis, lumbar region: Secondary | ICD-10-CM | POA: Diagnosis not present

## 2014-11-07 DIAGNOSIS — M1712 Unilateral primary osteoarthritis, left knee: Secondary | ICD-10-CM | POA: Diagnosis not present

## 2014-11-07 DIAGNOSIS — M545 Low back pain: Secondary | ICD-10-CM

## 2014-11-20 DIAGNOSIS — Z7901 Long term (current) use of anticoagulants: Secondary | ICD-10-CM | POA: Diagnosis not present

## 2014-11-20 DIAGNOSIS — Z79899 Other long term (current) drug therapy: Secondary | ICD-10-CM | POA: Diagnosis not present

## 2014-11-20 DIAGNOSIS — I82409 Acute embolism and thrombosis of unspecified deep veins of unspecified lower extremity: Secondary | ICD-10-CM | POA: Diagnosis not present

## 2014-11-22 ENCOUNTER — Ambulatory Visit
Admission: RE | Admit: 2014-11-22 | Discharge: 2014-11-22 | Disposition: A | Discharge status: Home / Self Care | Payer: Medicare Other | Source: Ambulatory Visit | Attending: Orthopedic Surgery | Admitting: Orthopedic Surgery

## 2014-11-22 DIAGNOSIS — M545 Low back pain: Secondary | ICD-10-CM

## 2014-11-22 DIAGNOSIS — M47816 Spondylosis without myelopathy or radiculopathy, lumbar region: Secondary | ICD-10-CM | POA: Diagnosis not present

## 2014-12-13 DIAGNOSIS — M4806 Spinal stenosis, lumbar region: Secondary | ICD-10-CM | POA: Diagnosis not present

## 2014-12-13 DIAGNOSIS — Z79899 Other long term (current) drug therapy: Secondary | ICD-10-CM | POA: Diagnosis not present

## 2014-12-13 DIAGNOSIS — I82409 Acute embolism and thrombosis of unspecified deep veins of unspecified lower extremity: Secondary | ICD-10-CM | POA: Diagnosis not present

## 2014-12-13 DIAGNOSIS — Z7901 Long term (current) use of anticoagulants: Secondary | ICD-10-CM | POA: Diagnosis not present

## 2014-12-21 DIAGNOSIS — Z7901 Long term (current) use of anticoagulants: Secondary | ICD-10-CM | POA: Diagnosis not present

## 2014-12-21 DIAGNOSIS — Z79899 Other long term (current) drug therapy: Secondary | ICD-10-CM | POA: Diagnosis not present

## 2014-12-21 DIAGNOSIS — I82409 Acute embolism and thrombosis of unspecified deep veins of unspecified lower extremity: Secondary | ICD-10-CM | POA: Diagnosis not present

## 2014-12-31 DIAGNOSIS — Z7901 Long term (current) use of anticoagulants: Secondary | ICD-10-CM | POA: Diagnosis not present

## 2014-12-31 DIAGNOSIS — I82409 Acute embolism and thrombosis of unspecified deep veins of unspecified lower extremity: Secondary | ICD-10-CM | POA: Diagnosis not present

## 2014-12-31 DIAGNOSIS — Z79899 Other long term (current) drug therapy: Secondary | ICD-10-CM | POA: Diagnosis not present

## 2015-01-03 DIAGNOSIS — M1712 Unilateral primary osteoarthritis, left knee: Secondary | ICD-10-CM | POA: Diagnosis not present

## 2015-01-03 DIAGNOSIS — M4806 Spinal stenosis, lumbar region: Secondary | ICD-10-CM | POA: Diagnosis not present

## 2015-01-31 DIAGNOSIS — Z79899 Other long term (current) drug therapy: Secondary | ICD-10-CM | POA: Diagnosis not present

## 2015-01-31 DIAGNOSIS — Z7901 Long term (current) use of anticoagulants: Secondary | ICD-10-CM | POA: Diagnosis not present

## 2015-01-31 DIAGNOSIS — Z5181 Encounter for therapeutic drug level monitoring: Secondary | ICD-10-CM | POA: Diagnosis not present

## 2015-02-04 DIAGNOSIS — I10 Essential (primary) hypertension: Secondary | ICD-10-CM | POA: Diagnosis not present

## 2015-02-04 DIAGNOSIS — N184 Chronic kidney disease, stage 4 (severe): Secondary | ICD-10-CM | POA: Diagnosis not present

## 2015-02-06 ENCOUNTER — Encounter (HOSPITAL_COMMUNITY): Payer: Self-pay | Admitting: Emergency Medicine

## 2015-02-06 ENCOUNTER — Other Ambulatory Visit: Payer: Self-pay

## 2015-02-06 ENCOUNTER — Emergency Department (HOSPITAL_COMMUNITY)
Admission: EM | Admit: 2015-02-06 | Discharge: 2015-02-07 | Disposition: A | Discharge status: Home / Self Care | Payer: Medicare Other | Attending: Emergency Medicine | Admitting: Emergency Medicine

## 2015-02-06 ENCOUNTER — Emergency Department (HOSPITAL_COMMUNITY): Payer: Medicare Other

## 2015-02-06 DIAGNOSIS — R0789 Other chest pain: Secondary | ICD-10-CM | POA: Diagnosis not present

## 2015-02-06 DIAGNOSIS — M545 Low back pain: Secondary | ICD-10-CM | POA: Diagnosis not present

## 2015-02-06 DIAGNOSIS — R0602 Shortness of breath: Secondary | ICD-10-CM

## 2015-02-06 DIAGNOSIS — E785 Hyperlipidemia, unspecified: Secondary | ICD-10-CM | POA: Insufficient documentation

## 2015-02-06 DIAGNOSIS — Z8551 Personal history of malignant neoplasm of bladder: Secondary | ICD-10-CM | POA: Insufficient documentation

## 2015-02-06 DIAGNOSIS — G8929 Other chronic pain: Secondary | ICD-10-CM | POA: Diagnosis not present

## 2015-02-06 DIAGNOSIS — J45901 Unspecified asthma with (acute) exacerbation: Secondary | ICD-10-CM | POA: Insufficient documentation

## 2015-02-06 DIAGNOSIS — Z79899 Other long term (current) drug therapy: Secondary | ICD-10-CM | POA: Insufficient documentation

## 2015-02-06 DIAGNOSIS — R079 Chest pain, unspecified: Secondary | ICD-10-CM | POA: Diagnosis not present

## 2015-02-06 DIAGNOSIS — E119 Type 2 diabetes mellitus without complications: Secondary | ICD-10-CM | POA: Diagnosis not present

## 2015-02-06 DIAGNOSIS — Z880 Allergy status to penicillin: Secondary | ICD-10-CM | POA: Insufficient documentation

## 2015-02-06 DIAGNOSIS — Z87448 Personal history of other diseases of urinary system: Secondary | ICD-10-CM | POA: Diagnosis not present

## 2015-02-06 DIAGNOSIS — Z7901 Long term (current) use of anticoagulants: Secondary | ICD-10-CM | POA: Insufficient documentation

## 2015-02-06 DIAGNOSIS — I10 Essential (primary) hypertension: Secondary | ICD-10-CM | POA: Insufficient documentation

## 2015-02-06 DIAGNOSIS — Z86718 Personal history of other venous thrombosis and embolism: Secondary | ICD-10-CM | POA: Insufficient documentation

## 2015-02-06 HISTORY — DX: Malignant neoplasm of bladder, unspecified: C67.9

## 2015-02-06 LAB — BASIC METABOLIC PANEL
Anion gap: 8 (ref 5–15)
BUN: 60 mg/dL — AB (ref 6–23)
CALCIUM: 9.3 mg/dL (ref 8.4–10.5)
CO2: 22 mmol/L (ref 19–32)
CREATININE: 3.57 mg/dL — AB (ref 0.50–1.35)
Chloride: 109 mmol/L (ref 96–112)
GFR calc Af Amer: 19 mL/min — ABNORMAL LOW (ref >90)
GFR calc non Af Amer: 16 mL/min — ABNORMAL LOW (ref >90)
Glucose, Bld: 95 mg/dL (ref 70–99)
Potassium: 3.9 mmol/L (ref 3.5–5.1)
Sodium: 139 mmol/L (ref 135–145)

## 2015-02-06 LAB — CBC
HCT: 30.8 % — ABNORMAL LOW (ref 39.0–52.0)
HEMOGLOBIN: 9.7 g/dL — AB (ref 13.0–17.0)
MCH: 27.5 pg (ref 26.0–34.0)
MCHC: 31.5 g/dL (ref 30.0–36.0)
MCV: 87.3 fL (ref 78.0–100.0)
PLATELETS: 182 10*3/uL (ref 150–400)
RBC: 3.53 MIL/uL — ABNORMAL LOW (ref 4.22–5.81)
RDW: 16 % — ABNORMAL HIGH (ref 11.5–15.5)
WBC: 7.1 10*3/uL (ref 4.0–10.5)

## 2015-02-06 LAB — I-STAT TROPONIN, ED: TROPONIN I, POC: 0.03 ng/mL (ref 0.00–0.08)

## 2015-02-06 LAB — PROTIME-INR
INR: 1.93 — ABNORMAL HIGH (ref 0.00–1.49)
Prothrombin Time: 22.2 seconds — ABNORMAL HIGH (ref 11.6–15.2)

## 2015-02-06 LAB — BRAIN NATRIURETIC PEPTIDE: B NATRIURETIC PEPTIDE 5: 53.4 pg/mL (ref 0.0–100.0)

## 2015-02-06 NOTE — ED Notes (Signed)
Pt states today around mid morning he bent over to do something and developed chest pain  Pt states he has been feeling fatigued for the past couple days but today it has progressively gotten worse  Pt states today he has been feeling short of breath esp on exertion  Pt denies chest pain at this time but states his back is hurting  Pt states he takes oxycodone for his back and has been out of it for a week or so

## 2015-02-06 NOTE — ED Notes (Signed)
Patient reports he has been out of his Percocet for approximately a week.  His PCP provided a prescription with 60 tabs, he is not due for refill yet - he has been taking more than prescribed for his back pain.

## 2015-02-06 NOTE — ED Provider Notes (Signed)
Medical screening examination/treatment/procedure(s) were conducted as a shared visit with non-physician practitioner(s) and myself.  I personally evaluated the patient during the encounter.   Date: 02/06/2015 21:14  Rate: 97  Rhythm: normal sinus rhythm  QRS Axis: normal  Intervals: LAFB  ST/T Wave abnormalities: normal  Conduction Disutrbances: none  Narrative Interpretation: LAFB, Q waves in anterior leads    Pt is a 69 y.o.  M with HTN, DM with atypical CP only when he bends over.  Has h/o DVT but on coumadin and doubt PE.  Will cycle troponin but anticipate dc home.      [place], DO 02/06/15 2346

## 2015-02-06 NOTE — ED Provider Notes (Signed)
CSN: [id number]     Arrival date & time 02/06/15  2105 History   First MD Initiated Contact with Patient 02/06/15 2140     Chief Complaint  Patient presents with  . Chest Pain     (Consider location/radiation/quality/duration/timing/severity/associated sxs/prior Treatment) Patient is a 69 y.o. male presenting with chest pain. The history is provided by the patient and medical records. No language interpreter was used.  Chest Pain Associated symptoms: back pain (chronic)   Associated symptoms: no abdominal pain, no cough, no diaphoresis, no fatigue, no fever, no headache, no nausea, no shortness of breath and not vomiting      TYAN DY is a 69 y.o. male  with a hx of HTN, NIDDM, asthma, DVT (on coumadin), bladder cancer presents to the Emergency Department complaining of intermittent chest pain/pressure with bending over and with complete resolution upon standing onset 1-2pm. Pt reports 3-4 episodes this afternoon.  Pt also reports he felt SOB and could hear himself breathing while dressing to come to the ER.  Pt reports this is not normal for him and denies CP or dyspnea on exertion.  Pt denies sick contacts, cough or other URI symptoms.  Pt denies being a current smoker but stopped 20 years ago.  Pt denies long trips, periods of immobilization, broken bones or surgeries.  Pt denies fever, chills, headache, neck pain, abd pain, N/V/D, weakness, dizziness, syncope, diaphoresis, cough.    Pt also c/o acute exacerbation of his chronic back pain.  Pt reports his MD at [facility] writes for his pain control.  Pt reports that when he was at pain management he was getting 120 tabs per month, but Dr. Karena Addison is only writing for 60 tabs per month.   Pt reports he cannot take Ibuprofen due to his CKD.  Patient denies saddle anesthesia, gait disturbance, loss of bowel or bladder control.   Past Medical History  Diagnosis Date  . Hypertension   . Diabetes mellitus without complication   .  Asthma   . Hyperlipidemia   . DVT (deep venous thrombosis)   . Renal insufficiency   . Bladder cancer    Past Surgical History  Procedure Laterality Date  . Back surgery      2 times  . Joint replacement      hip  . Arthroscopic knee surgery    . Cystoscopy     Family History  Problem Relation Age of Onset  . Arthritis Mother   . Cancer Father   . Heart disease Father   . Hypertension Sister   . Alcohol abuse Brother   . Diabetes Daughter    History  Substance Use Topics  . Smoking status: Former Research scientist (life sciences)  . Smokeless tobacco: Never Used  . Alcohol Use: No     Comment: former    Review of Systems  Constitutional: Negative for fever, diaphoresis, appetite change, fatigue and unexpected weight change.  HENT: Negative for mouth sores.   Eyes: Negative for visual disturbance.  Respiratory: Negative for cough, chest tightness, shortness of breath and wheezing.   Cardiovascular: Positive for chest pain.  Gastrointestinal: Negative for nausea, vomiting, abdominal pain, diarrhea and constipation.  Endocrine: Negative for polydipsia, polyphagia and polyuria.  Genitourinary: Negative for dysuria, urgency, frequency and hematuria.  Musculoskeletal: Positive for back pain (chronic). Negative for neck stiffness.  Skin: Negative for rash.  Allergic/Immunologic: Negative for immunocompromised state.  Neurological: Negative for syncope, light-headedness and headaches.  Hematological: Does not bruise/bleed easily.  Psychiatric/Behavioral: Negative for sleep  disturbance. The patient is not nervous/anxious.       Allergies  Meperidine hcl; Penicillins; Sulfonamide derivatives; and Povidone-iodine  Home Medications   Prior to Admission medications   Medication Sig Start Date End Date Taking? Authorizing Provider  allopurinol (ZYLOPRIM) 300 MG tablet Take 150 mg by mouth daily.     Yes Historical Provider, MD  BAYER BREEZE 2 TEST DISK  06/19/13  Yes Historical Provider, MD   calcitRIOL (ROCALTROL) 0.5 MCG capsule Take 0.5 mcg by mouth daily.  06/19/13  Yes Historical Provider, MD  doxazosin (CARDURA) 2 MG tablet Take 2 mg by mouth at bedtime.     Yes Historical Provider, MD  ferrous sulfate 325 (65 FE) MG tablet Take 325 mg by mouth daily with breakfast.     Yes Historical Provider, MD  furosemide (LASIX) 40 MG tablet Take 40 mg by mouth 2 (two) times daily.     Yes Historical Provider, MD  glipiZIDE (GLUCOTROL XL) 5 MG 24 hr tablet Take 5 mg by mouth daily.     Yes Historical Provider, MD  indapamide (LOZOL) 1.25 MG tablet Take 1.25 mg by mouth every morning.     Yes Historical Provider, MD  JANUVIA 50 MG tablet Take 25 mg by mouth daily.  05/25/13  Yes Historical Provider, MD  oxyCODONE-acetaminophen (PERCOCET/ROXICET) 5-325 MG per tablet Take 1 tablet by mouth every 6 (six) hours as needed for severe pain. 10/19/14  Yes Charlett Blake, MD  rosuvastatin (CRESTOR) 5 MG tablet Take 5 mg by mouth daily.   Yes Historical Provider, MD  warfarin (COUMADIN) 5 MG tablet Take 7.5 mg by mouth daily at 6 PM. As directed 06/19/13  Yes Historical Provider, MD   BP 157/80 mmHg  Pulse 90  Temp(Src) 98.4 F (36.9 C) (Oral)  Resp 20  Ht 5\' 9"  (1.753 m)  Wt 244 lb (110.678 kg)  BMI 36.02 kg/m2  SpO2 99% Physical Exam  Constitutional: He appears well-developed and well-nourished. No distress.  Awake, alert, nontoxic appearance  HENT:  Head: Normocephalic and atraumatic.  Mouth/Throat: Oropharynx is clear and moist. No oropharyngeal exudate.  Eyes: Conjunctivae are normal. No scleral icterus.  Neck: Normal range of motion. Neck supple.  Full ROM without pain  Cardiovascular: Normal rate, regular rhythm, normal heart sounds and intact distal pulses.   No murmur heard. Pulmonary/Chest: Effort normal and breath sounds normal. No respiratory distress. He has no wheezes.  Equal chest expansion  Abdominal: Soft. Bowel sounds are normal. He exhibits no distension and no mass.  There is no tenderness. There is no rebound and no guarding.  Musculoskeletal: Normal range of motion. He exhibits no edema.  Full range of motion of the T-spine and L-spine No tenderness to palpation of the spinous processes of the T-spine or L-spine Mild tenderness to palpation of the paraspinous muscles of the L-spine  Lymphadenopathy:    He has no cervical adenopathy.  Neurological: He is alert. He has normal reflexes.  Reflex Scores:      Bicep reflexes are 2+ on the right side and 2+ on the left side.      Brachioradialis reflexes are 2+ on the right side and 2+ on the left side.      Patellar reflexes are 2+ on the right side and 2+ on the left side.      Achilles reflexes are 2+ on the right side and 2+ on the left side. Speech is clear and goal oriented, follows commands Normal 5/5 strength in  upper and lower extremities bilaterally including dorsiflexion and plantar flexion, strong and equal grip strength Sensation normal to light and sharp touch Moves extremities without ataxia, coordination intact Normal gait Normal balance No Clonus   Skin: Skin is warm and dry. No rash noted. He is not diaphoretic. No erythema.  Psychiatric: He has a normal mood and affect. His behavior is normal.  Nursing note and vitals reviewed.   ED Course  Procedures (including critical care time) Labs Review Labs Reviewed  CBC - Abnormal; Notable for the following:    RBC 3.53 (*)    Hemoglobin 9.7 (*)    HCT 30.8 (*)    RDW 16.0 (*)    All other components within normal limits  BASIC METABOLIC PANEL - Abnormal; Notable for the following:    BUN 60 (*)    Creatinine, Ser 3.57 (*)    GFR calc non Af Amer 16 (*)    GFR calc Af Amer 19 (*)    All other components within normal limits  PROTIME-INR - Abnormal; Notable for the following:    Prothrombin Time 22.2 (*)    INR 1.93 (*)    All other components within normal limits  BRAIN NATRIURETIC PEPTIDE  I-STAT TROPOININ, ED    Imaging  Review Dg Chest 2 View  02/06/2015   CLINICAL DATA:  Acute onset of chest pain and fatigue. Shortness of breath on exertion. Initial encounter.  EXAM: CHEST  2 VIEW  COMPARISON:  Chest radiograph performed 05/28/2010  FINDINGS: The lungs are well-aerated and clear. There is no evidence of focal opacification, pleural effusion or pneumothorax.  The heart is borderline normal in size. No acute osseous abnormalities are seen.  IMPRESSION: No acute cardiopulmonary process seen.   Electronically Signed   By: Garald Balding M.D.   On: 02/06/2015 22:48     EKG Interpretation None      Date: 02/06/2015 21:14 Rate: 97 Rhythm: normal sinus rhythm QRS Axis: normal Intervals: LAFB ST/T Wave abnormalities: normal Conduction Disutrbances: none Narrative Interpretation: LAFB, Q waves in anterior leads   MDM   Final diagnoses:  SOB (shortness of breath)  Chest pain, atypical   [name] presents with atypical chest pain present only when bending over and resolving immediately. Patient with risk factors of hypertension and diabetes.  Heart Score 4 however pt does not give a Hx consistent with ACS.  Pt also with Hx of DVT but on coumadin and without tachycardia.  Doubt PE at this time.  Coumadin in therapeutic.  Labs are reassuring.  Pt given percocet in the Ed for his chronic back pain.  He ambulates without SOB or gait disturbance.    1:12 AM Care transferred to [facility], PA-C.  Plan: Delta troponin at 1:30am and pt may be d/c home if this is negative.  Will need to f/u with PCP this week.    Jarrett Soho Yazmyne Sara, PA-C 02/07/15 [phone]

## 2015-02-07 DIAGNOSIS — R0789 Other chest pain: Secondary | ICD-10-CM | POA: Diagnosis not present

## 2015-02-07 DIAGNOSIS — Z7901 Long term (current) use of anticoagulants: Secondary | ICD-10-CM | POA: Diagnosis not present

## 2015-02-07 DIAGNOSIS — Z79899 Other long term (current) drug therapy: Secondary | ICD-10-CM | POA: Diagnosis not present

## 2015-02-07 LAB — I-STAT TROPONIN, ED: Troponin i, poc: 0.03 ng/mL (ref 0.00–0.08)

## 2015-02-07 MED ORDER — OXYCODONE-ACETAMINOPHEN 5-325 MG PO TABS: 1.0000 | ORAL_TABLET | Freq: Four times a day (QID) | ORAL | Status: DC | PRN

## 2015-02-07 MED ORDER — OXYCODONE-ACETAMINOPHEN 5-325 MG PO TABS
2.0000 | ORAL_TABLET | Freq: Once | ORAL | Status: AC
  Administered 2015-02-07: 2 via ORAL
  Filled 2015-02-07: qty 2

## 2015-02-07 NOTE — Discharge Instructions (Signed)
1. Medications: usual home medications 2. Treatment: rest, drink plenty of fluids,  3. Follow Up: Please followup with your primary doctor in 2 days for discussion of your diagnoses and further evaluation after today's visit; if you do not have a primary care doctor use the resource guide provided to find one; Please return to the ER for return of chest pain or other concerns

## 2015-02-07 NOTE — ED Provider Notes (Signed)
Atypical chest pain Labs normal Delta troponin at 1:30  Delta trop negative. Discussed results with patient and wife. He is concerned about his medications being the cause of his symptoms (chronic narcotic use, out of meds). Percocet #4 provided and he is encouraged to see his regular prescribing physician for further discussion on continuing pain medications.  Charlann Lange, PA-C 02/07/15 0757  Julianne Rice, MD 02/07/15 [phone]

## 2015-02-14 ENCOUNTER — Other Ambulatory Visit: Payer: Self-pay | Admitting: Orthopedic Surgery

## 2015-02-14 DIAGNOSIS — M25512 Pain in left shoulder: Secondary | ICD-10-CM | POA: Diagnosis not present

## 2015-02-14 DIAGNOSIS — Z79899 Other long term (current) drug therapy: Secondary | ICD-10-CM | POA: Diagnosis not present

## 2015-02-14 DIAGNOSIS — Z7901 Long term (current) use of anticoagulants: Secondary | ICD-10-CM | POA: Diagnosis not present

## 2015-02-22 DIAGNOSIS — Z79899 Other long term (current) drug therapy: Secondary | ICD-10-CM | POA: Diagnosis not present

## 2015-02-22 DIAGNOSIS — Z7901 Long term (current) use of anticoagulants: Secondary | ICD-10-CM | POA: Diagnosis not present

## 2015-02-25 ENCOUNTER — Emergency Department (HOSPITAL_COMMUNITY)
Admission: EM | Admit: 2015-02-25 | Discharge: 2015-02-26 | Disposition: A | Discharge status: Home / Self Care | Payer: Medicare Other | Attending: Emergency Medicine | Admitting: Emergency Medicine

## 2015-02-25 ENCOUNTER — Encounter (HOSPITAL_COMMUNITY): Payer: Self-pay | Admitting: Emergency Medicine

## 2015-02-25 ENCOUNTER — Emergency Department (HOSPITAL_COMMUNITY): Payer: Medicare Other

## 2015-02-25 DIAGNOSIS — J45909 Unspecified asthma, uncomplicated: Secondary | ICD-10-CM | POA: Diagnosis not present

## 2015-02-25 DIAGNOSIS — Z8551 Personal history of malignant neoplasm of bladder: Secondary | ICD-10-CM | POA: Insufficient documentation

## 2015-02-25 DIAGNOSIS — F1123 Opioid dependence with withdrawal: Secondary | ICD-10-CM | POA: Insufficient documentation

## 2015-02-25 DIAGNOSIS — Z86718 Personal history of other venous thrombosis and embolism: Secondary | ICD-10-CM | POA: Diagnosis not present

## 2015-02-25 DIAGNOSIS — Z880 Allergy status to penicillin: Secondary | ICD-10-CM | POA: Insufficient documentation

## 2015-02-25 DIAGNOSIS — R002 Palpitations: Secondary | ICD-10-CM | POA: Diagnosis present

## 2015-02-25 DIAGNOSIS — E119 Type 2 diabetes mellitus without complications: Secondary | ICD-10-CM | POA: Insufficient documentation

## 2015-02-25 DIAGNOSIS — Z7901 Long term (current) use of anticoagulants: Secondary | ICD-10-CM | POA: Insufficient documentation

## 2015-02-25 DIAGNOSIS — Z87891 Personal history of nicotine dependence: Secondary | ICD-10-CM | POA: Insufficient documentation

## 2015-02-25 DIAGNOSIS — F1193 Opioid use, unspecified with withdrawal: Secondary | ICD-10-CM

## 2015-02-25 DIAGNOSIS — R112 Nausea with vomiting, unspecified: Secondary | ICD-10-CM | POA: Diagnosis not present

## 2015-02-25 DIAGNOSIS — E785 Hyperlipidemia, unspecified: Secondary | ICD-10-CM | POA: Diagnosis not present

## 2015-02-25 DIAGNOSIS — Z87448 Personal history of other diseases of urinary system: Secondary | ICD-10-CM | POA: Diagnosis not present

## 2015-02-25 DIAGNOSIS — Z79899 Other long term (current) drug therapy: Secondary | ICD-10-CM | POA: Diagnosis not present

## 2015-02-25 DIAGNOSIS — I10 Essential (primary) hypertension: Secondary | ICD-10-CM | POA: Diagnosis not present

## 2015-02-25 DIAGNOSIS — G8929 Other chronic pain: Secondary | ICD-10-CM | POA: Diagnosis not present

## 2015-02-25 LAB — CBC
HEMATOCRIT: 29.2 % — AB (ref 39.0–52.0)
HEMOGLOBIN: 9.5 g/dL — AB (ref 13.0–17.0)
MCH: 28.2 pg (ref 26.0–34.0)
MCHC: 32.5 g/dL (ref 30.0–36.0)
MCV: 86.6 fL (ref 78.0–100.0)
PLATELETS: 215 10*3/uL (ref 150–400)
RBC: 3.37 MIL/uL — AB (ref 4.22–5.81)
RDW: 15.5 % (ref 11.5–15.5)
WBC: 7.7 10*3/uL (ref 4.0–10.5)

## 2015-02-25 LAB — BASIC METABOLIC PANEL
ANION GAP: 10 (ref 5–15)
BUN: 57 mg/dL — ABNORMAL HIGH (ref 6–20)
CO2: 23 mmol/L (ref 22–32)
Calcium: 9.1 mg/dL (ref 8.9–10.3)
Chloride: 106 mmol/L (ref 101–111)
Creatinine, Ser: 3.13 mg/dL — ABNORMAL HIGH (ref 0.61–1.24)
GFR calc non Af Amer: 19 mL/min — ABNORMAL LOW (ref >60)
GFR, EST AFRICAN AMERICAN: 22 mL/min — AB (ref >60)
Glucose, Bld: 57 mg/dL — ABNORMAL LOW (ref 65–99)
POTASSIUM: 3.8 mmol/L (ref 3.5–5.1)
SODIUM: 139 mmol/L (ref 135–145)

## 2015-02-25 LAB — PROTIME-INR
INR: 2 — ABNORMAL HIGH (ref 0.00–1.49)
Prothrombin Time: 22.9 seconds — ABNORMAL HIGH (ref 11.6–15.2)

## 2015-02-25 LAB — I-STAT CG4 LACTIC ACID, ED: Lactic Acid, Venous: 0.45 mmol/L — ABNORMAL LOW (ref 0.5–2.0)

## 2015-02-25 MED ORDER — ONDANSETRON HCL 4 MG/2ML IJ SOLN
4.0000 mg | Freq: Once | INTRAMUSCULAR | Status: AC
  Administered 2015-02-25: 4 mg via INTRAVENOUS
  Filled 2015-02-25: qty 2

## 2015-02-25 MED ORDER — MORPHINE SULFATE 4 MG/ML IJ SOLN
8.0000 mg | Freq: Once | INTRAMUSCULAR | Status: AC
  Administered 2015-02-25: 8 mg via INTRAVENOUS
  Filled 2015-02-25: qty 2

## 2015-02-25 MED ORDER — SODIUM CHLORIDE 0.9 % IV BOLUS (SEPSIS)
1000.0000 mL | Freq: Once | INTRAVENOUS | Status: AC
  Administered 2015-02-25: 1000 mL via INTRAVENOUS

## 2015-02-25 NOTE — ED Notes (Addendum)
Pt c/o left arm pain and tingling, abd pain, vomiting last night and having heart palpitations today.  Pt states that he has been out of his pain medications x 3 days and not for sure if he is having withdrawal symptoms or not.  Pt denies diarrhea.

## 2015-02-26 ENCOUNTER — Ambulatory Visit
Admission: RE | Admit: 2015-02-26 | Discharge: 2015-02-26 | Disposition: A | Discharge status: Home / Self Care | Payer: Medicare Other | Source: Ambulatory Visit | Attending: Orthopedic Surgery | Admitting: Orthopedic Surgery

## 2015-02-26 DIAGNOSIS — M25512 Pain in left shoulder: Secondary | ICD-10-CM

## 2015-02-26 DIAGNOSIS — M7582 Other shoulder lesions, left shoulder: Secondary | ICD-10-CM | POA: Diagnosis not present

## 2015-02-26 MED ORDER — ZOLPIDEM TARTRATE 5 MG PO TABS: 5.0000 mg | ORAL_TABLET | Freq: Every evening | ORAL | Status: DC | PRN

## 2015-02-26 MED ORDER — ONDANSETRON 8 MG PO TBDP
8.0000 mg | ORAL_TABLET | Freq: Three times a day (TID) | ORAL | Status: DC | PRN
Start: 2015-02-26 — End: 2015-12-18

## 2015-02-26 MED ORDER — CLONIDINE HCL 0.1 MG PO TABS: 0.1000 mg | ORAL_TABLET | Freq: Three times a day (TID) | ORAL | Status: DC | PRN

## 2015-02-26 NOTE — ED Provider Notes (Signed)
CSN: [id number]     Arrival date & time 02/25/15  1800 History   First MD Initiated Contact with Patient 02/25/15 2135     Chief Complaint  Patient presents with  . Arm Pain  . Tingling  . Palpitations  . Abdominal Pain  . ? Withdrawal      HPI Patient reports nausea vomiting over the past 24 hours.  He is chronically on oxycodone for low back pain but has been out of his medications over the past 3 or 4 days.  He lost a relationship with his pain specialist 3 months ago.  In the meantime his had intermittent oxycodone from his orthopedic surgeon who is evaluating him for ongoing left shoulder pain.  He is scheduled for an MRI of his left shoulder tomorrow.  Reports increasing pain in his left shoulder and his low back.  He reports a tingling pain in his left arm.  He reports abdominal cramping.  He denies hematemesis.  No diarrhea.  No melena or hematochezia.   Past Medical History  Diagnosis Date  . Hypertension   . Diabetes mellitus without complication   . Asthma   . Hyperlipidemia   . DVT (deep venous thrombosis)   . Renal insufficiency   . Bladder cancer    Past Surgical History  Procedure Laterality Date  . Back surgery      2 times  . Joint replacement      hip  . Arthroscopic knee surgery    . Cystoscopy     Family History  Problem Relation Age of Onset  . Arthritis Mother   . Cancer Father   . Heart disease Father   . Hypertension Sister   . Alcohol abuse Brother   . Diabetes Daughter    History  Substance Use Topics  . Smoking status: Former Research scientist (life sciences)  . Smokeless tobacco: Never Used  . Alcohol Use: No     Comment: former    Review of Systems  All other systems reviewed and are negative.     Allergies  Meperidine hcl; Penicillins; Sulfonamide derivatives; and Povidone-iodine  Home Medications   Prior to Admission medications   Medication Sig Start Date End Date Taking? Authorizing Provider  allopurinol (ZYLOPRIM) 300 MG tablet Take 150 mg by  mouth daily.      Historical Provider, MD  BAYER BREEZE 2 TEST DISK  06/19/13   Historical Provider, MD  calcitRIOL (ROCALTROL) 0.5 MCG capsule Take 0.5 mcg by mouth daily.  06/19/13   Historical Provider, MD  cloNIDine (CATAPRES) 0.1 MG tablet Take 1 tablet (0.1 mg total) by mouth every 8 (eight) hours as needed (withdrawl symptoms). 02/25/15   Jola Schmidt, MD  doxazosin (CARDURA) 2 MG tablet Take 2 mg by mouth at bedtime.      Historical Provider, MD  ferrous sulfate 325 (65 FE) MG tablet Take 325 mg by mouth daily with breakfast.      Historical Provider, MD  furosemide (LASIX) 40 MG tablet Take 40 mg by mouth 2 (two) times daily.      Historical Provider, MD  glipiZIDE (GLUCOTROL XL) 5 MG 24 hr tablet Take 5 mg by mouth daily.      Historical Provider, MD  indapamide (LOZOL) 1.25 MG tablet Take 1.25 mg by mouth every morning.      Historical Provider, MD  JANUVIA 50 MG tablet Take 25 mg by mouth daily.  05/25/13   Historical Provider, MD  ondansetron (ZOFRAN ODT) 8 MG disintegrating tablet Take  1 tablet (8 mg total) by mouth every 8 (eight) hours as needed for nausea or vomiting. 02/26/15   Jola Schmidt, MD  oxyCODONE-acetaminophen (PERCOCET/ROXICET) 5-325 MG per tablet Take 1 tablet by mouth every 6 (six) hours as needed for severe pain. 10/19/14   Charlett Blake, MD  oxyCODONE-acetaminophen (PERCOCET/ROXICET) 5-325 MG per tablet Take 1 tablet by mouth every 6 (six) hours as needed for severe pain. 02/07/15   Charlann Lange, PA-C  rosuvastatin (CRESTOR) 5 MG tablet Take 5 mg by mouth daily.    Historical Provider, MD  warfarin (COUMADIN) 5 MG tablet Take 7.5 mg by mouth daily at 6 PM. As directed 06/19/13   Historical Provider, MD  zolpidem (AMBIEN) 5 MG tablet Take 1 tablet (5 mg total) by mouth at bedtime as needed for sleep. 02/25/15   Jola Schmidt, MD   BP 147/88 mmHg  Pulse 84  Temp(Src) 98.4 F (36.9 C) (Oral)  Resp 14  SpO2 97% Physical Exam  Constitutional: He is oriented to person,  place, and time. He appears well-developed and well-nourished.  HENT:  Head: Normocephalic and atraumatic.  Eyes: EOM are normal.  Neck: Normal range of motion.  Cardiovascular: Normal rate, regular rhythm, normal heart sounds and intact distal pulses.   Pulmonary/Chest: Effort normal and breath sounds normal. No respiratory distress.  Abdominal: Soft. He exhibits no distension. There is no tenderness.  Musculoskeletal: Normal range of motion.  Neurological: He is alert and oriented to person, place, and time.  Skin: Skin is warm and dry.  Psychiatric: He has a normal mood and affect. Judgment normal.  Nursing note and vitals reviewed.   ED Course  Procedures (including critical care time) Labs Review Labs Reviewed  CBC - Abnormal; Notable for the following:    RBC 3.37 (*)    Hemoglobin 9.5 (*)    HCT 29.2 (*)    All other components within normal limits  BASIC METABOLIC PANEL - Abnormal; Notable for the following:    Glucose, Bld 57 (*)    BUN 57 (*)    Creatinine, Ser 3.13 (*)    GFR calc non Af Amer 19 (*)    GFR calc Af Amer 22 (*)    All other components within normal limits  PROTIME-INR - Abnormal; Notable for the following:    Prothrombin Time 22.9 (*)    INR 2.00 (*)    All other components within normal limits  I-STAT CG4 LACTIC ACID, ED - Abnormal; Notable for the following:    Lactic Acid, Venous 0.45 (*)    All other components within normal limits  I-STAT TROPOININ, ED    Imaging Review Dg Chest 2 View  02/25/2015   CLINICAL DATA:  LEFT arm pain and tingling, abdominal pain, vomiting last night, palpitations today, out of pain medication question withdrawal, history hypertension, diabetes, asthma, bladder cancer  EXAM: CHEST  2 VIEW  COMPARISON:  02/06/2015  FINDINGS: Enlargement of cardiac silhouette.  Tortuous aorta.  Mediastinal contours and pulmonary vascularity normal.  Lungs clear.  No pleural effusion or pneumothorax.  Bones unremarkable.  IMPRESSION:  Enlargement of cardiac silhouette.  No acute abnormalities.   Electronically Signed   By: Lavonia Dana M.D.   On: 02/25/2015 19:30  I personally reviewed the imaging tests through PACS system I reviewed available ER/hospitalization records through the EMR    EKG Interpretation   Date/Time:  Monday Feb 25 2015 18:28:46 EDT Ventricular Rate:  97 PR Interval:  165 QRS Duration: 110 QT  Interval:  363 QTC Calculation: 461 R Axis:   -51 Text Interpretation:  Sinus tachycardia Paired ventricular premature  complexes Left anterior fascicular block Probable anteroseptal infarct,  old No significant change was found Confirmed by Charlton Boule  MD, Lennette Bihari ([id number])  on 02/25/2015 9:55:22 PM      MDM   Final diagnoses:  Nausea and vomiting, vomiting of unspecified type  Opioid withdrawal  Chronic pain    Overall the patient is well-appearing.  This appears to be narcotic withdrawal.  The patient has chronic pain and he is not a person who abuses his pain medication he simply is loss of relationship with his pain specialist over alcohol noted in his system in December.  I do believe the patient has chronic pain and therefore I offered the patient a dose of pain medication in the emergency department.  He did accept this.  He understands that he will not be discharged home with pain medication.    Jola Schmidt, MD 02/26/15 [phone]

## 2015-02-27 DIAGNOSIS — Z6838 Body mass index (BMI) 38.0-38.9, adult: Secondary | ICD-10-CM | POA: Diagnosis not present

## 2015-02-27 DIAGNOSIS — E1122 Type 2 diabetes mellitus with diabetic chronic kidney disease: Secondary | ICD-10-CM | POA: Diagnosis not present

## 2015-02-27 DIAGNOSIS — G603 Idiopathic progressive neuropathy: Secondary | ICD-10-CM | POA: Diagnosis not present

## 2015-02-27 DIAGNOSIS — E785 Hyperlipidemia, unspecified: Secondary | ICD-10-CM | POA: Diagnosis not present

## 2015-02-27 DIAGNOSIS — I10 Essential (primary) hypertension: Secondary | ICD-10-CM | POA: Diagnosis not present

## 2015-02-27 DIAGNOSIS — M109 Gout, unspecified: Secondary | ICD-10-CM | POA: Diagnosis not present

## 2015-02-27 DIAGNOSIS — G541 Lumbosacral plexus disorders: Secondary | ICD-10-CM | POA: Diagnosis not present

## 2015-02-27 DIAGNOSIS — M545 Low back pain: Secondary | ICD-10-CM | POA: Diagnosis not present

## 2015-02-27 DIAGNOSIS — M25562 Pain in left knee: Secondary | ICD-10-CM | POA: Diagnosis not present

## 2015-02-27 DIAGNOSIS — N400 Enlarged prostate without lower urinary tract symptoms: Secondary | ICD-10-CM | POA: Diagnosis not present

## 2015-02-27 DIAGNOSIS — G8929 Other chronic pain: Secondary | ICD-10-CM | POA: Diagnosis not present

## 2015-02-27 DIAGNOSIS — I82409 Acute embolism and thrombosis of unspecified deep veins of unspecified lower extremity: Secondary | ICD-10-CM | POA: Diagnosis not present

## 2015-02-27 DIAGNOSIS — D638 Anemia in other chronic diseases classified elsewhere: Secondary | ICD-10-CM | POA: Diagnosis not present

## 2015-02-27 DIAGNOSIS — M75102 Unspecified rotator cuff tear or rupture of left shoulder, not specified as traumatic: Secondary | ICD-10-CM | POA: Diagnosis not present

## 2015-02-27 DIAGNOSIS — E663 Overweight: Secondary | ICD-10-CM | POA: Diagnosis not present

## 2015-02-27 DIAGNOSIS — G540 Brachial plexus disorders: Secondary | ICD-10-CM | POA: Diagnosis not present

## 2015-02-27 DIAGNOSIS — N184 Chronic kidney disease, stage 4 (severe): Secondary | ICD-10-CM | POA: Diagnosis not present

## 2015-02-27 DIAGNOSIS — M79602 Pain in left arm: Secondary | ICD-10-CM | POA: Diagnosis not present

## 2015-03-06 DIAGNOSIS — M25512 Pain in left shoulder: Secondary | ICD-10-CM | POA: Diagnosis not present

## 2015-03-06 DIAGNOSIS — M25412 Effusion, left shoulder: Secondary | ICD-10-CM | POA: Diagnosis not present

## 2015-03-07 DIAGNOSIS — Z8551 Personal history of malignant neoplasm of bladder: Secondary | ICD-10-CM | POA: Diagnosis not present

## 2015-03-07 DIAGNOSIS — R972 Elevated prostate specific antigen [PSA]: Secondary | ICD-10-CM | POA: Diagnosis not present

## 2015-03-13 DIAGNOSIS — M25562 Pain in left knee: Secondary | ICD-10-CM | POA: Diagnosis not present

## 2015-03-13 DIAGNOSIS — M545 Low back pain: Secondary | ICD-10-CM | POA: Diagnosis not present

## 2015-03-13 DIAGNOSIS — M25512 Pain in left shoulder: Secondary | ICD-10-CM | POA: Diagnosis not present

## 2015-03-21 DIAGNOSIS — M47816 Spondylosis without myelopathy or radiculopathy, lumbar region: Secondary | ICD-10-CM | POA: Diagnosis not present

## 2015-03-25 DIAGNOSIS — Z79899 Other long term (current) drug therapy: Secondary | ICD-10-CM | POA: Diagnosis not present

## 2015-03-25 DIAGNOSIS — Z7901 Long term (current) use of anticoagulants: Secondary | ICD-10-CM | POA: Diagnosis not present

## 2015-03-26 DIAGNOSIS — S46919A Strain of unspecified muscle, fascia and tendon at shoulder and upper arm level, unspecified arm, initial encounter: Secondary | ICD-10-CM | POA: Diagnosis not present

## 2015-03-26 DIAGNOSIS — S8390XA Sprain of unspecified site of unspecified knee, initial encounter: Secondary | ICD-10-CM | POA: Diagnosis not present

## 2015-03-26 DIAGNOSIS — S73109A Unspecified sprain of unspecified hip, initial encounter: Secondary | ICD-10-CM | POA: Diagnosis not present

## 2015-03-26 DIAGNOSIS — S43409A Unspecified sprain of unspecified shoulder joint, initial encounter: Secondary | ICD-10-CM | POA: Diagnosis not present

## 2015-03-26 DIAGNOSIS — S86919A Strain of unspecified muscle(s) and tendon(s) at lower leg level, unspecified leg, initial encounter: Secondary | ICD-10-CM | POA: Diagnosis not present

## 2015-03-26 DIAGNOSIS — M545 Low back pain: Secondary | ICD-10-CM | POA: Diagnosis not present

## 2015-03-26 DIAGNOSIS — R42 Dizziness and giddiness: Secondary | ICD-10-CM | POA: Diagnosis not present

## 2015-04-12 DIAGNOSIS — G8929 Other chronic pain: Secondary | ICD-10-CM | POA: Diagnosis not present

## 2015-04-12 DIAGNOSIS — M25562 Pain in left knee: Secondary | ICD-10-CM | POA: Diagnosis not present

## 2015-04-12 DIAGNOSIS — M545 Low back pain: Secondary | ICD-10-CM | POA: Diagnosis not present

## 2015-04-12 DIAGNOSIS — Z79891 Long term (current) use of opiate analgesic: Secondary | ICD-10-CM | POA: Diagnosis not present

## 2015-04-12 DIAGNOSIS — M25512 Pain in left shoulder: Secondary | ICD-10-CM | POA: Diagnosis not present

## 2015-04-17 DIAGNOSIS — Z7901 Long term (current) use of anticoagulants: Secondary | ICD-10-CM | POA: Diagnosis not present

## 2015-04-17 DIAGNOSIS — Z79899 Other long term (current) drug therapy: Secondary | ICD-10-CM | POA: Diagnosis not present

## 2015-04-25 DIAGNOSIS — Z79899 Other long term (current) drug therapy: Secondary | ICD-10-CM | POA: Diagnosis not present

## 2015-04-25 DIAGNOSIS — Z7901 Long term (current) use of anticoagulants: Secondary | ICD-10-CM | POA: Diagnosis not present

## 2015-05-10 DIAGNOSIS — Z79891 Long term (current) use of opiate analgesic: Secondary | ICD-10-CM | POA: Diagnosis not present

## 2015-05-10 DIAGNOSIS — M25512 Pain in left shoulder: Secondary | ICD-10-CM | POA: Diagnosis not present

## 2015-05-10 DIAGNOSIS — G8929 Other chronic pain: Secondary | ICD-10-CM | POA: Diagnosis not present

## 2015-05-10 DIAGNOSIS — M545 Low back pain: Secondary | ICD-10-CM | POA: Diagnosis not present

## 2015-05-10 DIAGNOSIS — M25562 Pain in left knee: Secondary | ICD-10-CM | POA: Diagnosis not present

## 2015-05-23 DIAGNOSIS — Z7901 Long term (current) use of anticoagulants: Secondary | ICD-10-CM | POA: Diagnosis not present

## 2015-05-23 DIAGNOSIS — Z79899 Other long term (current) drug therapy: Secondary | ICD-10-CM | POA: Diagnosis not present

## 2015-06-03 DIAGNOSIS — N184 Chronic kidney disease, stage 4 (severe): Secondary | ICD-10-CM | POA: Diagnosis not present

## 2015-06-05 DIAGNOSIS — I10 Essential (primary) hypertension: Secondary | ICD-10-CM | POA: Diagnosis not present

## 2015-06-05 DIAGNOSIS — R609 Edema, unspecified: Secondary | ICD-10-CM | POA: Diagnosis not present

## 2015-06-05 DIAGNOSIS — N184 Chronic kidney disease, stage 4 (severe): Secondary | ICD-10-CM | POA: Diagnosis not present

## 2015-06-07 DIAGNOSIS — M25562 Pain in left knee: Secondary | ICD-10-CM | POA: Diagnosis not present

## 2015-06-07 DIAGNOSIS — Z79891 Long term (current) use of opiate analgesic: Secondary | ICD-10-CM | POA: Diagnosis not present

## 2015-06-07 DIAGNOSIS — M545 Low back pain: Secondary | ICD-10-CM | POA: Diagnosis not present

## 2015-06-07 DIAGNOSIS — G8929 Other chronic pain: Secondary | ICD-10-CM | POA: Diagnosis not present

## 2015-06-07 DIAGNOSIS — M25512 Pain in left shoulder: Secondary | ICD-10-CM | POA: Diagnosis not present

## 2015-06-20 DIAGNOSIS — Z7901 Long term (current) use of anticoagulants: Secondary | ICD-10-CM | POA: Diagnosis not present

## 2015-06-20 DIAGNOSIS — Z79899 Other long term (current) drug therapy: Secondary | ICD-10-CM | POA: Diagnosis not present

## 2015-06-25 DIAGNOSIS — E663 Overweight: Secondary | ICD-10-CM | POA: Diagnosis not present

## 2015-06-25 DIAGNOSIS — I10 Essential (primary) hypertension: Secondary | ICD-10-CM | POA: Diagnosis not present

## 2015-06-25 DIAGNOSIS — N400 Enlarged prostate without lower urinary tract symptoms: Secondary | ICD-10-CM | POA: Diagnosis not present

## 2015-06-25 DIAGNOSIS — M545 Low back pain: Secondary | ICD-10-CM | POA: Diagnosis not present

## 2015-06-25 DIAGNOSIS — E1122 Type 2 diabetes mellitus with diabetic chronic kidney disease: Secondary | ICD-10-CM | POA: Diagnosis not present

## 2015-06-25 DIAGNOSIS — C679 Malignant neoplasm of bladder, unspecified: Secondary | ICD-10-CM | POA: Diagnosis not present

## 2015-06-25 DIAGNOSIS — Z23 Encounter for immunization: Secondary | ICD-10-CM | POA: Diagnosis not present

## 2015-06-25 DIAGNOSIS — Z6838 Body mass index (BMI) 38.0-38.9, adult: Secondary | ICD-10-CM | POA: Diagnosis not present

## 2015-06-25 DIAGNOSIS — I82409 Acute embolism and thrombosis of unspecified deep veins of unspecified lower extremity: Secondary | ICD-10-CM | POA: Diagnosis not present

## 2015-06-25 DIAGNOSIS — E785 Hyperlipidemia, unspecified: Secondary | ICD-10-CM | POA: Diagnosis not present

## 2015-06-25 DIAGNOSIS — D638 Anemia in other chronic diseases classified elsewhere: Secondary | ICD-10-CM | POA: Diagnosis not present

## 2015-06-25 DIAGNOSIS — N184 Chronic kidney disease, stage 4 (severe): Secondary | ICD-10-CM | POA: Diagnosis not present

## 2015-07-05 DIAGNOSIS — R202 Paresthesia of skin: Secondary | ICD-10-CM | POA: Diagnosis not present

## 2015-07-05 DIAGNOSIS — M545 Low back pain: Secondary | ICD-10-CM | POA: Diagnosis not present

## 2015-07-05 DIAGNOSIS — G8929 Other chronic pain: Secondary | ICD-10-CM | POA: Diagnosis not present

## 2015-07-05 DIAGNOSIS — M542 Cervicalgia: Secondary | ICD-10-CM | POA: Diagnosis not present

## 2015-07-18 DIAGNOSIS — Z7901 Long term (current) use of anticoagulants: Secondary | ICD-10-CM | POA: Diagnosis not present

## 2015-07-18 DIAGNOSIS — Z79899 Other long term (current) drug therapy: Secondary | ICD-10-CM | POA: Diagnosis not present

## 2015-08-02 DIAGNOSIS — G8929 Other chronic pain: Secondary | ICD-10-CM | POA: Diagnosis not present

## 2015-08-02 DIAGNOSIS — M25552 Pain in left hip: Secondary | ICD-10-CM | POA: Diagnosis not present

## 2015-08-02 DIAGNOSIS — M25562 Pain in left knee: Secondary | ICD-10-CM | POA: Diagnosis not present

## 2015-08-02 DIAGNOSIS — M25512 Pain in left shoulder: Secondary | ICD-10-CM | POA: Diagnosis not present

## 2015-08-02 DIAGNOSIS — M545 Low back pain: Secondary | ICD-10-CM | POA: Diagnosis not present

## 2015-08-02 DIAGNOSIS — Z79891 Long term (current) use of opiate analgesic: Secondary | ICD-10-CM | POA: Diagnosis not present

## 2015-08-15 DIAGNOSIS — I82409 Acute embolism and thrombosis of unspecified deep veins of unspecified lower extremity: Secondary | ICD-10-CM | POA: Diagnosis not present

## 2015-08-22 DIAGNOSIS — I82409 Acute embolism and thrombosis of unspecified deep veins of unspecified lower extremity: Secondary | ICD-10-CM | POA: Diagnosis not present

## 2015-09-02 DIAGNOSIS — M79605 Pain in left leg: Secondary | ICD-10-CM | POA: Diagnosis not present

## 2015-09-02 DIAGNOSIS — Z79891 Long term (current) use of opiate analgesic: Secondary | ICD-10-CM | POA: Diagnosis not present

## 2015-09-02 DIAGNOSIS — M5417 Radiculopathy, lumbosacral region: Secondary | ICD-10-CM | POA: Diagnosis not present

## 2015-09-02 DIAGNOSIS — G541 Lumbosacral plexus disorders: Secondary | ICD-10-CM | POA: Diagnosis not present

## 2015-09-02 DIAGNOSIS — R202 Paresthesia of skin: Secondary | ICD-10-CM | POA: Diagnosis not present

## 2015-09-02 DIAGNOSIS — M79604 Pain in right leg: Secondary | ICD-10-CM | POA: Diagnosis not present

## 2015-09-02 DIAGNOSIS — G603 Idiopathic progressive neuropathy: Secondary | ICD-10-CM | POA: Diagnosis not present

## 2015-09-02 DIAGNOSIS — M25552 Pain in left hip: Secondary | ICD-10-CM | POA: Diagnosis not present

## 2015-09-02 DIAGNOSIS — G8929 Other chronic pain: Secondary | ICD-10-CM | POA: Diagnosis not present

## 2015-09-02 DIAGNOSIS — M25551 Pain in right hip: Secondary | ICD-10-CM | POA: Diagnosis not present

## 2015-09-04 DIAGNOSIS — I82409 Acute embolism and thrombosis of unspecified deep veins of unspecified lower extremity: Secondary | ICD-10-CM | POA: Diagnosis not present

## 2015-09-25 DIAGNOSIS — I82409 Acute embolism and thrombosis of unspecified deep veins of unspecified lower extremity: Secondary | ICD-10-CM | POA: Diagnosis not present

## 2015-09-30 DIAGNOSIS — M545 Low back pain: Secondary | ICD-10-CM | POA: Diagnosis not present

## 2015-09-30 DIAGNOSIS — G894 Chronic pain syndrome: Secondary | ICD-10-CM | POA: Diagnosis not present

## 2015-09-30 DIAGNOSIS — M25562 Pain in left knee: Secondary | ICD-10-CM | POA: Diagnosis not present

## 2015-09-30 DIAGNOSIS — G890 Central pain syndrome: Secondary | ICD-10-CM | POA: Diagnosis not present

## 2015-09-30 DIAGNOSIS — M25552 Pain in left hip: Secondary | ICD-10-CM | POA: Diagnosis not present

## 2015-09-30 DIAGNOSIS — G8929 Other chronic pain: Secondary | ICD-10-CM | POA: Diagnosis not present

## 2015-09-30 DIAGNOSIS — M25511 Pain in right shoulder: Secondary | ICD-10-CM | POA: Diagnosis not present

## 2015-09-30 DIAGNOSIS — M25512 Pain in left shoulder: Secondary | ICD-10-CM | POA: Diagnosis not present

## 2015-10-01 DIAGNOSIS — J069 Acute upper respiratory infection, unspecified: Secondary | ICD-10-CM | POA: Diagnosis not present

## 2015-10-03 DIAGNOSIS — N184 Chronic kidney disease, stage 4 (severe): Secondary | ICD-10-CM | POA: Diagnosis not present

## 2015-10-03 DIAGNOSIS — I10 Essential (primary) hypertension: Secondary | ICD-10-CM | POA: Diagnosis not present

## 2015-10-03 DIAGNOSIS — R609 Edema, unspecified: Secondary | ICD-10-CM | POA: Diagnosis not present

## 2015-10-18 DIAGNOSIS — N184 Chronic kidney disease, stage 4 (severe): Secondary | ICD-10-CM | POA: Diagnosis not present

## 2015-10-18 DIAGNOSIS — I10 Essential (primary) hypertension: Secondary | ICD-10-CM | POA: Diagnosis not present

## 2015-10-22 DIAGNOSIS — I82409 Acute embolism and thrombosis of unspecified deep veins of unspecified lower extremity: Secondary | ICD-10-CM | POA: Diagnosis not present

## 2015-10-28 DIAGNOSIS — Z79891 Long term (current) use of opiate analgesic: Secondary | ICD-10-CM | POA: Diagnosis not present

## 2015-11-18 DIAGNOSIS — I82409 Acute embolism and thrombosis of unspecified deep veins of unspecified lower extremity: Secondary | ICD-10-CM | POA: Diagnosis not present

## 2015-11-21 DIAGNOSIS — E785 Hyperlipidemia, unspecified: Secondary | ICD-10-CM | POA: Diagnosis not present

## 2015-11-21 DIAGNOSIS — I82409 Acute embolism and thrombosis of unspecified deep veins of unspecified lower extremity: Secondary | ICD-10-CM | POA: Diagnosis not present

## 2015-11-21 DIAGNOSIS — I499 Cardiac arrhythmia, unspecified: Secondary | ICD-10-CM | POA: Diagnosis not present

## 2015-11-21 DIAGNOSIS — Z7984 Long term (current) use of oral hypoglycemic drugs: Secondary | ICD-10-CM | POA: Diagnosis not present

## 2015-11-21 DIAGNOSIS — C679 Malignant neoplasm of bladder, unspecified: Secondary | ICD-10-CM | POA: Diagnosis not present

## 2015-11-21 DIAGNOSIS — G8929 Other chronic pain: Secondary | ICD-10-CM | POA: Diagnosis not present

## 2015-11-21 DIAGNOSIS — N184 Chronic kidney disease, stage 4 (severe): Secondary | ICD-10-CM | POA: Diagnosis not present

## 2015-11-21 DIAGNOSIS — Z1389 Encounter for screening for other disorder: Secondary | ICD-10-CM | POA: Diagnosis not present

## 2015-11-21 DIAGNOSIS — N400 Enlarged prostate without lower urinary tract symptoms: Secondary | ICD-10-CM | POA: Diagnosis not present

## 2015-11-21 DIAGNOSIS — I10 Essential (primary) hypertension: Secondary | ICD-10-CM | POA: Diagnosis not present

## 2015-11-21 DIAGNOSIS — D638 Anemia in other chronic diseases classified elsewhere: Secondary | ICD-10-CM | POA: Diagnosis not present

## 2015-11-21 DIAGNOSIS — Z0001 Encounter for general adult medical examination with abnormal findings: Secondary | ICD-10-CM | POA: Diagnosis not present

## 2015-11-21 DIAGNOSIS — E1122 Type 2 diabetes mellitus with diabetic chronic kidney disease: Secondary | ICD-10-CM | POA: Diagnosis not present

## 2015-11-26 DIAGNOSIS — M25512 Pain in left shoulder: Secondary | ICD-10-CM | POA: Diagnosis not present

## 2015-11-26 DIAGNOSIS — Z79891 Long term (current) use of opiate analgesic: Secondary | ICD-10-CM | POA: Diagnosis not present

## 2015-11-26 DIAGNOSIS — M25562 Pain in left knee: Secondary | ICD-10-CM | POA: Diagnosis not present

## 2015-11-26 DIAGNOSIS — G894 Chronic pain syndrome: Secondary | ICD-10-CM | POA: Diagnosis not present

## 2015-11-26 DIAGNOSIS — G890 Central pain syndrome: Secondary | ICD-10-CM | POA: Diagnosis not present

## 2015-11-26 DIAGNOSIS — M25511 Pain in right shoulder: Secondary | ICD-10-CM | POA: Diagnosis not present

## 2015-11-26 DIAGNOSIS — M25552 Pain in left hip: Secondary | ICD-10-CM | POA: Diagnosis not present

## 2015-11-26 DIAGNOSIS — M545 Low back pain: Secondary | ICD-10-CM | POA: Diagnosis not present

## 2015-12-03 ENCOUNTER — Inpatient Hospital Stay (HOSPITAL_COMMUNITY)
Admission: EM | Admit: 2015-12-03 | Discharge: 2015-12-18 | DRG: 673 | Disposition: A | Discharge status: Home / Self Care | Payer: Medicare Other | Attending: Internal Medicine | Admitting: Internal Medicine

## 2015-12-03 DIAGNOSIS — J811 Chronic pulmonary edema: Secondary | ICD-10-CM

## 2015-12-03 DIAGNOSIS — R202 Paresthesia of skin: Secondary | ICD-10-CM

## 2015-12-03 DIAGNOSIS — E785 Hyperlipidemia, unspecified: Secondary | ICD-10-CM | POA: Diagnosis present

## 2015-12-03 DIAGNOSIS — I5023 Acute on chronic systolic (congestive) heart failure: Secondary | ICD-10-CM | POA: Diagnosis not present

## 2015-12-03 DIAGNOSIS — S37011A Minor contusion of right kidney, initial encounter: Secondary | ICD-10-CM | POA: Diagnosis not present

## 2015-12-03 DIAGNOSIS — N281 Cyst of kidney, acquired: Secondary | ICD-10-CM | POA: Diagnosis present

## 2015-12-03 DIAGNOSIS — I130 Hypertensive heart and chronic kidney disease with heart failure and stage 1 through stage 4 chronic kidney disease, or unspecified chronic kidney disease: Secondary | ICD-10-CM | POA: Diagnosis not present

## 2015-12-03 DIAGNOSIS — R109 Unspecified abdominal pain: Secondary | ICD-10-CM | POA: Diagnosis not present

## 2015-12-03 DIAGNOSIS — R200 Anesthesia of skin: Secondary | ICD-10-CM

## 2015-12-03 DIAGNOSIS — Z7984 Long term (current) use of oral hypoglycemic drugs: Secondary | ICD-10-CM

## 2015-12-03 DIAGNOSIS — J45909 Unspecified asthma, uncomplicated: Secondary | ICD-10-CM | POA: Diagnosis present

## 2015-12-03 DIAGNOSIS — N2889 Other specified disorders of kidney and ureter: Principal | ICD-10-CM

## 2015-12-03 DIAGNOSIS — N184 Chronic kidney disease, stage 4 (severe): Secondary | ICD-10-CM | POA: Diagnosis present

## 2015-12-03 DIAGNOSIS — E119 Type 2 diabetes mellitus without complications: Secondary | ICD-10-CM

## 2015-12-03 DIAGNOSIS — Z96642 Presence of left artificial hip joint: Secondary | ICD-10-CM | POA: Diagnosis present

## 2015-12-03 DIAGNOSIS — Z7901 Long term (current) use of anticoagulants: Secondary | ICD-10-CM

## 2015-12-03 DIAGNOSIS — Z9889 Other specified postprocedural states: Secondary | ICD-10-CM

## 2015-12-03 DIAGNOSIS — Z79899 Other long term (current) drug therapy: Secondary | ICD-10-CM

## 2015-12-03 DIAGNOSIS — I472 Ventricular tachycardia: Secondary | ICD-10-CM | POA: Diagnosis not present

## 2015-12-03 DIAGNOSIS — N186 End stage renal disease: Secondary | ICD-10-CM

## 2015-12-03 DIAGNOSIS — N179 Acute kidney failure, unspecified: Secondary | ICD-10-CM

## 2015-12-03 DIAGNOSIS — I2510 Atherosclerotic heart disease of native coronary artery without angina pectoris: Secondary | ICD-10-CM | POA: Diagnosis present

## 2015-12-03 DIAGNOSIS — E1122 Type 2 diabetes mellitus with diabetic chronic kidney disease: Secondary | ICD-10-CM | POA: Diagnosis not present

## 2015-12-03 DIAGNOSIS — Z86718 Personal history of other venous thrombosis and embolism: Secondary | ICD-10-CM

## 2015-12-03 DIAGNOSIS — Z09 Encounter for follow-up examination after completed treatment for conditions other than malignant neoplasm: Secondary | ICD-10-CM

## 2015-12-03 DIAGNOSIS — R112 Nausea with vomiting, unspecified: Secondary | ICD-10-CM

## 2015-12-03 DIAGNOSIS — Z888 Allergy status to other drugs, medicaments and biological substances status: Secondary | ICD-10-CM

## 2015-12-03 DIAGNOSIS — Z8551 Personal history of malignant neoplasm of bladder: Secondary | ICD-10-CM

## 2015-12-03 DIAGNOSIS — Z992 Dependence on renal dialysis: Secondary | ICD-10-CM

## 2015-12-03 DIAGNOSIS — N185 Chronic kidney disease, stage 5: Secondary | ICD-10-CM

## 2015-12-03 DIAGNOSIS — Z882 Allergy status to sulfonamides status: Secondary | ICD-10-CM

## 2015-12-03 DIAGNOSIS — D631 Anemia in chronic kidney disease: Secondary | ICD-10-CM | POA: Diagnosis present

## 2015-12-03 DIAGNOSIS — R609 Edema, unspecified: Secondary | ICD-10-CM

## 2015-12-03 DIAGNOSIS — Z880 Allergy status to penicillin: Secondary | ICD-10-CM

## 2015-12-03 DIAGNOSIS — E876 Hypokalemia: Secondary | ICD-10-CM | POA: Diagnosis not present

## 2015-12-03 DIAGNOSIS — N189 Chronic kidney disease, unspecified: Secondary | ICD-10-CM

## 2015-12-03 DIAGNOSIS — Z87891 Personal history of nicotine dependence: Secondary | ICD-10-CM

## 2015-12-03 DIAGNOSIS — K5900 Constipation, unspecified: Secondary | ICD-10-CM | POA: Diagnosis not present

## 2015-12-03 DIAGNOSIS — Z419 Encounter for procedure for purposes other than remedying health state, unspecified: Secondary | ICD-10-CM

## 2015-12-03 DIAGNOSIS — E8770 Fluid overload, unspecified: Secondary | ICD-10-CM | POA: Diagnosis present

## 2015-12-04 ENCOUNTER — Encounter (HOSPITAL_COMMUNITY): Payer: Self-pay | Admitting: Emergency Medicine

## 2015-12-04 ENCOUNTER — Emergency Department (HOSPITAL_COMMUNITY): Payer: Medicare Other

## 2015-12-04 ENCOUNTER — Inpatient Hospital Stay (HOSPITAL_COMMUNITY): Payer: Medicare Other

## 2015-12-04 DIAGNOSIS — Z452 Encounter for adjustment and management of vascular access device: Secondary | ICD-10-CM | POA: Diagnosis not present

## 2015-12-04 DIAGNOSIS — Z8551 Personal history of malignant neoplasm of bladder: Secondary | ICD-10-CM | POA: Diagnosis not present

## 2015-12-04 DIAGNOSIS — J45909 Unspecified asthma, uncomplicated: Secondary | ICD-10-CM | POA: Diagnosis present

## 2015-12-04 DIAGNOSIS — I130 Hypertensive heart and chronic kidney disease with heart failure and stage 1 through stage 4 chronic kidney disease, or unspecified chronic kidney disease: Secondary | ICD-10-CM | POA: Diagnosis not present

## 2015-12-04 DIAGNOSIS — N2889 Other specified disorders of kidney and ureter: Secondary | ICD-10-CM | POA: Diagnosis not present

## 2015-12-04 DIAGNOSIS — D649 Anemia, unspecified: Secondary | ICD-10-CM

## 2015-12-04 DIAGNOSIS — I10 Essential (primary) hypertension: Secondary | ICD-10-CM | POA: Diagnosis not present

## 2015-12-04 DIAGNOSIS — D631 Anemia in chronic kidney disease: Secondary | ICD-10-CM | POA: Diagnosis present

## 2015-12-04 DIAGNOSIS — K5900 Constipation, unspecified: Secondary | ICD-10-CM | POA: Diagnosis not present

## 2015-12-04 DIAGNOSIS — N179 Acute kidney failure, unspecified: Secondary | ICD-10-CM

## 2015-12-04 DIAGNOSIS — Z880 Allergy status to penicillin: Secondary | ICD-10-CM | POA: Diagnosis not present

## 2015-12-04 DIAGNOSIS — R109 Unspecified abdominal pain: Secondary | ICD-10-CM | POA: Diagnosis not present

## 2015-12-04 DIAGNOSIS — I5023 Acute on chronic systolic (congestive) heart failure: Secondary | ICD-10-CM | POA: Diagnosis not present

## 2015-12-04 DIAGNOSIS — S37011D Minor contusion of right kidney, subsequent encounter: Secondary | ICD-10-CM | POA: Diagnosis not present

## 2015-12-04 DIAGNOSIS — I5021 Acute systolic (congestive) heart failure: Secondary | ICD-10-CM | POA: Diagnosis not present

## 2015-12-04 DIAGNOSIS — Z96642 Presence of left artificial hip joint: Secondary | ICD-10-CM | POA: Diagnosis present

## 2015-12-04 DIAGNOSIS — E118 Type 2 diabetes mellitus with unspecified complications: Secondary | ICD-10-CM | POA: Diagnosis not present

## 2015-12-04 DIAGNOSIS — E785 Hyperlipidemia, unspecified: Secondary | ICD-10-CM | POA: Diagnosis present

## 2015-12-04 DIAGNOSIS — Z992 Dependence on renal dialysis: Secondary | ICD-10-CM | POA: Diagnosis not present

## 2015-12-04 DIAGNOSIS — I509 Heart failure, unspecified: Secondary | ICD-10-CM | POA: Diagnosis not present

## 2015-12-04 DIAGNOSIS — E8770 Fluid overload, unspecified: Secondary | ICD-10-CM | POA: Diagnosis not present

## 2015-12-04 DIAGNOSIS — R609 Edema, unspecified: Secondary | ICD-10-CM | POA: Diagnosis not present

## 2015-12-04 DIAGNOSIS — R200 Anesthesia of skin: Secondary | ICD-10-CM | POA: Diagnosis not present

## 2015-12-04 DIAGNOSIS — I472 Ventricular tachycardia: Secondary | ICD-10-CM | POA: Diagnosis not present

## 2015-12-04 DIAGNOSIS — N184 Chronic kidney disease, stage 4 (severe): Secondary | ICD-10-CM | POA: Diagnosis not present

## 2015-12-04 DIAGNOSIS — Z7901 Long term (current) use of anticoagulants: Secondary | ICD-10-CM

## 2015-12-04 DIAGNOSIS — S37019A Minor contusion of unspecified kidney, initial encounter: Secondary | ICD-10-CM | POA: Insufficient documentation

## 2015-12-04 DIAGNOSIS — N186 End stage renal disease: Secondary | ICD-10-CM | POA: Diagnosis not present

## 2015-12-04 DIAGNOSIS — Z7984 Long term (current) use of oral hypoglycemic drugs: Secondary | ICD-10-CM | POA: Diagnosis not present

## 2015-12-04 DIAGNOSIS — R1084 Generalized abdominal pain: Secondary | ICD-10-CM | POA: Diagnosis not present

## 2015-12-04 DIAGNOSIS — Z86718 Personal history of other venous thrombosis and embolism: Secondary | ICD-10-CM

## 2015-12-04 DIAGNOSIS — R0602 Shortness of breath: Secondary | ICD-10-CM | POA: Diagnosis not present

## 2015-12-04 DIAGNOSIS — Z79899 Other long term (current) drug therapy: Secondary | ICD-10-CM | POA: Diagnosis not present

## 2015-12-04 DIAGNOSIS — E1122 Type 2 diabetes mellitus with diabetic chronic kidney disease: Secondary | ICD-10-CM | POA: Diagnosis present

## 2015-12-04 DIAGNOSIS — S37011A Minor contusion of right kidney, initial encounter: Secondary | ICD-10-CM

## 2015-12-04 DIAGNOSIS — Z882 Allergy status to sulfonamides status: Secondary | ICD-10-CM | POA: Diagnosis not present

## 2015-12-04 DIAGNOSIS — R0609 Other forms of dyspnea: Secondary | ICD-10-CM | POA: Diagnosis not present

## 2015-12-04 DIAGNOSIS — D638 Anemia in other chronic diseases classified elsewhere: Secondary | ICD-10-CM | POA: Diagnosis not present

## 2015-12-04 DIAGNOSIS — I2510 Atherosclerotic heart disease of native coronary artery without angina pectoris: Secondary | ICD-10-CM | POA: Diagnosis present

## 2015-12-04 DIAGNOSIS — Z87891 Personal history of nicotine dependence: Secondary | ICD-10-CM | POA: Diagnosis not present

## 2015-12-04 DIAGNOSIS — N185 Chronic kidney disease, stage 5: Secondary | ICD-10-CM | POA: Diagnosis not present

## 2015-12-04 DIAGNOSIS — Z888 Allergy status to other drugs, medicaments and biological substances status: Secondary | ICD-10-CM | POA: Diagnosis not present

## 2015-12-04 DIAGNOSIS — E876 Hypokalemia: Secondary | ICD-10-CM | POA: Diagnosis not present

## 2015-12-04 DIAGNOSIS — N281 Cyst of kidney, acquired: Secondary | ICD-10-CM | POA: Diagnosis present

## 2015-12-04 LAB — URINALYSIS, ROUTINE W REFLEX MICROSCOPIC
BILIRUBIN URINE: NEGATIVE
Glucose, UA: NEGATIVE mg/dL
KETONES UR: NEGATIVE mg/dL
Leukocytes, UA: NEGATIVE
NITRITE: NEGATIVE
PH: 5.5 (ref 5.0–8.0)
Protein, ur: 100 mg/dL — AB
Specific Gravity, Urine: 1.012 (ref 1.005–1.030)

## 2015-12-04 LAB — PROTIME-INR
INR: 2.15 — AB (ref 0.00–1.49)
INR: 1.34 (ref 0.00–1.49)
INR: 1.45 (ref 0.00–1.49)
Prothrombin Time: 23.1 seconds — ABNORMAL HIGH (ref 11.6–15.2)
Prothrombin Time: 16.2 seconds — ABNORMAL HIGH (ref 11.6–15.2)
Prothrombin Time: 17.2 seconds — ABNORMAL HIGH (ref 11.6–15.2)

## 2015-12-04 LAB — LIPASE, BLOOD: LIPASE: 23 U/L (ref 11–51)

## 2015-12-04 LAB — CBC
HEMATOCRIT: 23.3 % — AB (ref 39.0–52.0)
HEMATOCRIT: 24.7 % — AB (ref 39.0–52.0)
Hemoglobin: 7.7 g/dL — ABNORMAL LOW (ref 13.0–17.0)
Hemoglobin: 8 g/dL — ABNORMAL LOW (ref 13.0–17.0)
MCH: 29 pg (ref 26.0–34.0)
MCH: 29.1 pg (ref 26.0–34.0)
MCHC: 32.4 g/dL (ref 30.0–36.0)
MCHC: 33 g/dL (ref 30.0–36.0)
MCV: 87.9 fL (ref 78.0–100.0)
MCV: 89.5 fL (ref 78.0–100.0)
PLATELETS: 159 10*3/uL (ref 150–400)
PLATELETS: 164 10*3/uL (ref 150–400)
RBC: 2.65 MIL/uL — AB (ref 4.22–5.81)
RBC: 2.76 MIL/uL — ABNORMAL LOW (ref 4.22–5.81)
RDW: 15.7 % — ABNORMAL HIGH (ref 11.5–15.5)
RDW: 15.9 % — AB (ref 11.5–15.5)
WBC: 7.7 10*3/uL (ref 4.0–10.5)
WBC: 8.1 10*3/uL (ref 4.0–10.5)

## 2015-12-04 LAB — COMPREHENSIVE METABOLIC PANEL
ALK PHOS: 48 U/L (ref 38–126)
ALT: 18 U/L (ref 17–63)
AST: 28 U/L (ref 15–41)
Albumin: 3.6 g/dL (ref 3.5–5.0)
Anion gap: 12 (ref 5–15)
BILIRUBIN TOTAL: 0.5 mg/dL (ref 0.3–1.2)
BUN: 67 mg/dL — AB (ref 6–20)
CALCIUM: 8.8 mg/dL — AB (ref 8.9–10.3)
CO2: 23 mmol/L (ref 22–32)
Chloride: 105 mmol/L (ref 101–111)
Creatinine, Ser: 5.48 mg/dL — ABNORMAL HIGH (ref 0.61–1.24)
GFR calc Af Amer: 11 mL/min — ABNORMAL LOW (ref >60)
GFR calc non Af Amer: 9 mL/min — ABNORMAL LOW (ref >60)
Glucose, Bld: 180 mg/dL — ABNORMAL HIGH (ref 65–99)
Potassium: 4.4 mmol/L (ref 3.5–5.1)
SODIUM: 140 mmol/L (ref 135–145)
TOTAL PROTEIN: 6.6 g/dL (ref 6.5–8.1)

## 2015-12-04 LAB — GLUCOSE, CAPILLARY
Glucose-Capillary: 92 mg/dL (ref 65–99)
Glucose-Capillary: 84 mg/dL (ref 65–99)

## 2015-12-04 LAB — URINE MICROSCOPIC-ADD ON

## 2015-12-04 LAB — CBG MONITORING, ED: Glucose-Capillary: 73 mg/dL (ref 65–99)

## 2015-12-04 MED ORDER — HYDROMORPHONE HCL 1 MG/ML IJ SOLN
0.5000 mg | INTRAMUSCULAR | Status: DC | PRN
  Administered 2015-12-04 – 2015-12-18 (×35): 1 mg via INTRAVENOUS
  Filled 2015-12-04 (×38): qty 1

## 2015-12-04 MED ORDER — INSULIN ASPART 100 UNIT/ML ~~LOC~~ SOLN
0.0000 [IU] | Freq: Three times a day (TID) | SUBCUTANEOUS | Status: DC
  Administered 2015-12-06: 3 [IU] via SUBCUTANEOUS
  Administered 2015-12-07: 2 [IU] via SUBCUTANEOUS
  Administered 2015-12-08 – 2015-12-09 (×4): 1 [IU] via SUBCUTANEOUS
  Administered 2015-12-10: 2 [IU] via SUBCUTANEOUS
  Administered 2015-12-10: 1 [IU] via SUBCUTANEOUS
  Administered 2015-12-10 – 2015-12-12 (×5): 2 [IU] via SUBCUTANEOUS
  Administered 2015-12-13: 5 [IU] via SUBCUTANEOUS
  Administered 2015-12-14: 2 [IU] via SUBCUTANEOUS
  Administered 2015-12-14: 3 [IU] via SUBCUTANEOUS
  Administered 2015-12-15: 1 [IU] via SUBCUTANEOUS
  Administered 2015-12-15: 3 [IU] via SUBCUTANEOUS
  Administered 2015-12-15 – 2015-12-17 (×4): 1 [IU] via SUBCUTANEOUS
  Administered 2015-12-17: 2 [IU] via SUBCUTANEOUS
  Administered 2015-12-18 (×2): 1 [IU] via SUBCUTANEOUS
  Administered 2015-12-18: 2 [IU] via SUBCUTANEOUS

## 2015-12-04 MED ORDER — INDAPAMIDE 1.25 MG PO TABS
1.2500 mg | ORAL_TABLET | Freq: Once | ORAL | Status: DC
  Filled 2015-12-04: qty 1

## 2015-12-04 MED ORDER — HYDRALAZINE HCL 20 MG/ML IJ SOLN: 10.0000 mg | Freq: Four times a day (QID) | INTRAMUSCULAR | Status: DC | PRN

## 2015-12-04 MED ORDER — ACETAMINOPHEN 650 MG RE SUPP: 650.0000 mg | Freq: Four times a day (QID) | RECTAL | Status: DC | PRN

## 2015-12-04 MED ORDER — DOCUSATE SODIUM 100 MG PO CAPS
100.0000 mg | ORAL_CAPSULE | Freq: Two times a day (BID) | ORAL | Status: DC
  Administered 2015-12-05 – 2015-12-12 (×13): 100 mg via ORAL
  Filled 2015-12-04 (×16): qty 1

## 2015-12-04 MED ORDER — ALBUTEROL SULFATE (2.5 MG/3ML) 0.083% IN NEBU: 2.5000 mg | INHALATION_SOLUTION | RESPIRATORY_TRACT | Status: DC | PRN

## 2015-12-04 MED ORDER — ONDANSETRON HCL 4 MG/2ML IJ SOLN
4.0000 mg | Freq: Four times a day (QID) | INTRAMUSCULAR | Status: DC | PRN
Start: 2015-12-04 — End: 2015-12-18
  Administered 2015-12-16: 4 mg via INTRAVENOUS
  Filled 2015-12-04: qty 2

## 2015-12-04 MED ORDER — ACETAMINOPHEN 325 MG PO TABS: 650.0000 mg | ORAL_TABLET | Freq: Four times a day (QID) | ORAL | Status: DC | PRN

## 2015-12-04 MED ORDER — AMLODIPINE BESYLATE 10 MG PO TABS
10.0000 mg | ORAL_TABLET | Freq: Every day | ORAL | Status: DC
  Administered 2015-12-05 – 2015-12-18 (×14): 10 mg via ORAL
  Filled 2015-12-04 (×14): qty 1

## 2015-12-04 MED ORDER — SODIUM CHLORIDE 0.9 % IV SOLN
INTRAVENOUS | Status: DC
  Administered 2015-12-04: 10 mL via INTRAVENOUS
  Administered 2015-12-13: 07:00:00 via INTRAVENOUS

## 2015-12-04 MED ORDER — PROTHROMBIN COMPLEX CONC HUMAN 500 UNITS IV KIT
2644.0000 [IU] | PACK | Freq: Once | INTRAVENOUS | Status: AC
  Administered 2015-12-04: 2644 [IU] via INTRAVENOUS
  Filled 2015-12-04: qty 106

## 2015-12-04 MED ORDER — CYCLOBENZAPRINE HCL 5 MG PO TABS
5.0000 mg | ORAL_TABLET | Freq: Three times a day (TID) | ORAL | Status: DC | PRN
  Administered 2015-12-08: 5 mg via ORAL
  Filled 2015-12-04 (×2): qty 1

## 2015-12-04 MED ORDER — ONDANSETRON HCL 4 MG PO TABS
4.0000 mg | ORAL_TABLET | Freq: Four times a day (QID) | ORAL | Status: DC | PRN
Start: 2015-12-04 — End: 2015-12-18
  Filled 2015-12-04: qty 1

## 2015-12-04 MED ORDER — FENTANYL CITRATE (PF) 100 MCG/2ML IJ SOLN
50.0000 ug | INTRAMUSCULAR | Status: AC | PRN
  Administered 2015-12-04 (×3): 50 ug via INTRAVENOUS
  Filled 2015-12-04 (×3): qty 2

## 2015-12-04 MED ORDER — VITAMIN K1 10 MG/ML IJ SOLN
10.0000 mg | Freq: Once | INTRAVENOUS | Status: AC
  Administered 2015-12-04: 10 mg via INTRAVENOUS
  Filled 2015-12-04: qty 1

## 2015-12-04 MED ORDER — PROTHROMBIN COMPLEX CONC HUMAN 500 UNITS IV KIT
2500.0000 [IU] | PACK | INTRAVENOUS | Status: DC
  Filled 2015-12-04: qty 100

## 2015-12-04 MED ORDER — FUROSEMIDE 10 MG/ML IJ SOLN
80.0000 mg | Freq: Three times a day (TID) | INTRAMUSCULAR | Status: DC
  Administered 2015-12-04 – 2015-12-05 (×2): 80 mg via INTRAVENOUS
  Filled 2015-12-04 (×3): qty 8

## 2015-12-04 MED ORDER — SODIUM CHLORIDE 0.9% FLUSH
3.0000 mL | Freq: Two times a day (BID) | INTRAVENOUS | Status: DC
  Administered 2015-12-05 – 2015-12-18 (×20): 3 mL via INTRAVENOUS

## 2015-12-04 MED ORDER — CALCITRIOL 0.5 MCG PO CAPS
0.5000 ug | ORAL_CAPSULE | Freq: Every day | ORAL | Status: DC
  Administered 2015-12-04 – 2015-12-18 (×15): 0.5 ug via ORAL
  Filled 2015-12-04 (×15): qty 1

## 2015-12-04 MED ORDER — FENTANYL CITRATE (PF) 100 MCG/2ML IJ SOLN
50.0000 ug | Freq: Once | INTRAMUSCULAR | Status: AC
  Administered 2015-12-04: 50 ug via INTRAVENOUS
  Filled 2015-12-04: qty 2

## 2015-12-04 MED ORDER — OXYCODONE HCL 5 MG PO TABS
5.0000 mg | ORAL_TABLET | ORAL | Status: DC | PRN
  Administered 2015-12-04 – 2015-12-17 (×12): 5 mg via ORAL
  Filled 2015-12-04 (×13): qty 1

## 2015-12-04 MED ORDER — DOXAZOSIN MESYLATE 4 MG PO TABS
4.0000 mg | ORAL_TABLET | Freq: Every day | ORAL | Status: DC
  Administered 2015-12-04 – 2015-12-06 (×3): 4 mg via ORAL
  Filled 2015-12-04 (×3): qty 1

## 2015-12-04 NOTE — ED Notes (Signed)
Pt not in room when this RN entered to attempt IV start and lab draw.  It is assumed that he is in CT.

## 2015-12-04 NOTE — ED Notes (Signed)
Hospitalist at bedside 

## 2015-12-04 NOTE — ED Notes (Addendum)
Patient transported to CT 

## 2015-12-04 NOTE — ED Notes (Signed)
Pt states that since 1800 tonight he has had R sided flank pain. Denies urinary symptoms. Tried meds at home w/o relief. Alert and oriented.

## 2015-12-04 NOTE — Consult Note (Addendum)
Renal Service Consult Note [facility]  Albert Stanley 12/04/2015 [place] D Requesting Physician:  Dr. [place] Pink, G.   Reason for Consult:  CKD patient w flank pain/ peri-nephric bleed  HPI: The patient is a 70 y.o. year-old with hx of HTN, DM2, bladder Ca, and CKD f/b Dr. Florene Glen at [name]. Baseline creat around 4.  Presented yesterday w R flank pain, acute onset. CT in ED showed R subcapsular renal hemorrhage, as well as bilat simple and complex renal lesions, likely renal cysts based on prior studies. This per radiology report.  No stones or hydro noted. Creat 5.4 and pt also reports LE edema worse than usual.  Asked to see for CKD and edema.    Patient denies any anorexia, N/V, loss of appetite.  No cough, SOB or CP.  No fever, chills, or diarrhea.  No rash, HA , sore throat.      ROS  no joint pain  no HA  no itching  no focal weakness  Past Medical History  Past Medical History  Diagnosis Date  . Hypertension   . Diabetes mellitus without complication ([place])   . Asthma   . Hyperlipidemia   . DVT (deep venous thrombosis) ([place])   . Renal insufficiency   . Bladder cancer ([place]) dx'd 1990    surg only   Past Surgical History  Past Surgical History  Procedure Laterality Date  . Back surgery      2 times  . Joint replacement      hip  . Arthroscopic knee surgery    . Cystoscopy     Family History  Family History  Problem Relation Age of Onset  . Arthritis Mother   . Cancer Father   . Heart disease Father   . Hypertension Sister   . Alcohol abuse Brother   . Diabetes Daughter    Social History  reports that he has quit smoking. He has never used smokeless tobacco. He reports that he does not drink alcohol or use illicit drugs. Allergies  Allergies  Allergen Reactions  . Meperidine Hcl     Unknown  . Penicillins     "Knocks me out."   Has patient had a PCN reaction causing immediate rash, facial/tongue/throat swelling, SOB or  lightheadedness with hypotension: yes- blacked out Has patient had a PCN reaction causing severe rash involving mucus membranes or skin necrosis: unknown Has patient had a PCN reaction that required hospitalization: at MD office Has patient had a PCN reaction occurring within the last 10 years: no If all of the above answers are "NO", then may proceed with Cephalosporin use.   . Sulfonamide Derivatives     Unknown.   . Povidone-Iodine Rash   Home medications Prior to Admission medications   Medication Sig Start Date End Date Taking? Authorizing Provider  allopurinol (ZYLOPRIM) 300 MG tablet Take 150 mg by mouth daily.     Yes Historical Provider, MD  amLODipine (NORVASC) 10 MG tablet Take 10 mg by mouth daily. 12/02/15  Yes Historical Provider, MD  calcitRIOL (ROCALTROL) 0.5 MCG capsule Take 0.5 mcg by mouth daily.  06/19/13  Yes Historical Provider, MD  cyclobenzaprine (FLEXERIL) 5 MG tablet Take 5 mg by mouth 3 (three) times daily as needed for muscle spasms.   Yes Historical Provider, MD  doxazosin (CARDURA) 4 MG tablet Take 4 mg by mouth daily. 12/02/15  Yes Historical Provider, MD  ferrous sulfate 325 (65 FE) MG tablet Take 325 mg by mouth daily with  breakfast.     Yes Historical Provider, MD  fluticasone (FLONASE) 50 MCG/ACT nasal spray Place 1 spray into both nostrils daily as needed for allergies or rhinitis.   Yes Historical Provider, MD  furosemide (LASIX) 80 MG tablet Take 80 mg by mouth daily. 12/02/15  Yes Historical Provider, MD  glipiZIDE (GLUCOTROL XL) 5 MG 24 hr tablet Take 5 mg by mouth daily.     Yes Historical Provider, MD  indapamide (LOZOL) 1.25 MG tablet Take 1.25 mg by mouth every morning.     Yes Historical Provider, MD  JANUVIA 50 MG tablet Take 25 mg by mouth daily.  05/25/13  Yes Historical Provider, MD  ondansetron (ZOFRAN ODT) 8 MG disintegrating tablet Take 1 tablet (8 mg total) by mouth every 8 (eight) hours as needed for nausea or vomiting. 02/26/15  Yes Jola Schmidt, MD  oxyCODONE-acetaminophen (PERCOCET) 7.5-325 MG tablet Take 1 tablet by mouth every 8 (eight) hours as needed. For pain 10/30/15  Yes Historical Provider, MD  oxyCODONE-acetaminophen (PERCOCET/ROXICET) 5-325 MG per tablet Take 1 tablet by mouth every 6 (six) hours as needed for severe pain. 10/19/14  Yes Charlett Blake, MD  oxymetazoline (AFRIN) 0.05 % nasal spray Place 1 spray into both nostrils 2 (two) times daily as needed for congestion.   Yes Historical Provider, MD  rosuvastatin (CRESTOR) 5 MG tablet Take 5 mg by mouth daily.   Yes Historical Provider, MD  warfarin (COUMADIN) 5 MG tablet Take 2.5-5 mg by mouth daily at 6 PM. Taking 5 on Monday, Tuesday, Wednesday, Thursday and Friday and take 2.5mg  on Saturday and Sunday 06/19/13  Yes Historical Provider, MD  cloNIDine (CATAPRES) 0.1 MG tablet Take 1 tablet (0.1 mg total) by mouth every 8 (eight) hours as needed (withdrawl symptoms). Patient not taking: Reported on 12/04/2015 02/25/15   Jola Schmidt, MD  zolpidem (AMBIEN) 5 MG tablet Take 1 tablet (5 mg total) by mouth at bedtime as needed for sleep. Patient not taking: Reported on 12/04/2015 02/25/15   Jola Schmidt, MD   Liver Function Tests  Recent Labs Lab 12/04/15 0022  AST 28  ALT 18  ALKPHOS 48  BILITOT 0.5  PROT 6.6  ALBUMIN 3.6    Recent Labs Lab 12/04/15 0022  LIPASE 23   CBC  Recent Labs Lab 12/04/15 0022  WBC 7.7  HGB 8.0*  HCT 24.7*  MCV 89.5  PLT 123456   Basic Metabolic Panel  Recent Labs Lab 12/04/15 0022  NA 140  K 4.4  CL 105  CO2 23  GLUCOSE 180*  BUN 67*  CREATININE 5.48*  CALCIUM 8.8*    Filed Vitals:   12/04/15 1030 12/04/15 1100 12/04/15 1311 12/04/15 1528  BP: 152/92 146/90 170/93 142/96  Pulse: 85 81 95 89  Temp:      TempSrc:      Resp: 13 15 17 16   Height:      Weight:      SpO2: 95% 94% 95% 94%   Exam Alert obese AAM no distress, calm No rash, cyanosis or gangrene Sclera anicteric, throat clear JVP 12-14  cm Chest rales L base, R clear  RRR no MRG aBd obese , soft ntnd no mass or ascites GU normal male MS no joint effusion/ deform Ext 2+ pitting pretib edema bilat LE's No UE edema, no sacral edema Neuro no asterixis, Ox 3, calm  UA - 0-5 rbc/ wbc/ epi, prot 100  , 1.012 Na 140  K 4.4  CO2 23 BUN  67  Creat 5.48  Alb 3.6  LFT's ok  Hb 8.0  WBC 7k  Home meds > allopurinol, norvasc, Rocaltrol, Flexeril, Cardura, iron pills, flonase, Lasix 80/d, glipizide, indapamide, januvia, percocet prn/ oxycodone prn, rosuvastatin, warfarin, clonidine, zolpidem   Assessment: 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 Creat up slightly from baseline in the 4's.   2 R flank pain/ subcapsular hematoma - rec'd vit K and Kcentra , per prim 3 Vol excess/ LE edema - needs some diuresis. Check CXR.  4 HTN cont norvasc/ cardura/ clon 5 Anemia of CKD 6 Hx bladder cancer 7 Renal cysts, some complex lesions - prob needs MRI when more stable  Plan - IV lasix, diurese. No need for HD at this time. Will follow.   Kelly Splinter MD [employer] pager [phone]    cell [phone] 12/04/2015, 3:56 PM

## 2015-12-04 NOTE — ED Provider Notes (Signed)
CSN: [id number]     Arrival date & time 12/03/15  2340 History   First MD Initiated Contact with Patient 12/04/15 [phone]     Chief Complaint  Patient presents with  . Flank Pain     (Consider location/radiation/quality/duration/timing/severity/associated sxs/prior Treatment) Patient is a 70 y.o. male presenting with flank pain. The history is provided by the patient.  Flank Pain This is a new problem. The current episode started 12 to 24 hours ago. The problem occurs constantly. The problem has not changed since onset.Pertinent negatives include no chest pain, no abdominal pain and no shortness of breath. Nothing aggravates the symptoms. Relieved by: pain medicine. He has tried nothing for the symptoms. The treatment provided no relief.    Past Medical History  Diagnosis Date  . Hypertension   . Diabetes mellitus without complication ([place])   . Asthma   . Hyperlipidemia   . DVT (deep venous thrombosis) ([place])   . Renal insufficiency   . Bladder cancer [facility])    Past Surgical History  Procedure Laterality Date  . Back surgery      2 times  . Joint replacement      hip  . Arthroscopic knee surgery    . Cystoscopy     Family History  Problem Relation Age of Onset  . Arthritis Mother   . Cancer Father   . Heart disease Father   . Hypertension Sister   . Alcohol abuse Brother   . Diabetes Daughter    Social History  Substance Use Topics  . Smoking status: Former Research scientist (life sciences)  . Smokeless tobacco: Never Used  . Alcohol Use: No     Comment: former    Review of Systems  Respiratory: Negative for shortness of breath.   Cardiovascular: Negative for chest pain.  Gastrointestinal: Negative for abdominal pain.  Genitourinary: Positive for flank pain.  All other systems reviewed and are negative.     Allergies  Meperidine hcl; Penicillins; Sulfonamide derivatives; and Povidone-iodine  Home Medications   Prior to Admission medications   Medication Sig Start Date End Date  Taking? Authorizing Provider  allopurinol (ZYLOPRIM) 300 MG tablet Take 150 mg by mouth daily.      Historical Provider, MD  BAYER BREEZE 2 TEST DISK  06/19/13   Historical Provider, MD  calcitRIOL (ROCALTROL) 0.5 MCG capsule Take 0.5 mcg by mouth daily.  06/19/13   Historical Provider, MD  cloNIDine (CATAPRES) 0.1 MG tablet Take 1 tablet (0.1 mg total) by mouth every 8 (eight) hours as needed (withdrawl symptoms). 02/25/15   Jola Schmidt, MD  doxazosin (CARDURA) 2 MG tablet Take 2 mg by mouth at bedtime.      Historical Provider, MD  ferrous sulfate 325 (65 FE) MG tablet Take 325 mg by mouth daily with breakfast.      Historical Provider, MD  furosemide (LASIX) 40 MG tablet Take 40 mg by mouth 2 (two) times daily.      Historical Provider, MD  glipiZIDE (GLUCOTROL XL) 5 MG 24 hr tablet Take 5 mg by mouth daily.      Historical Provider, MD  indapamide (LOZOL) 1.25 MG tablet Take 1.25 mg by mouth every morning.      Historical Provider, MD  JANUVIA 50 MG tablet Take 25 mg by mouth daily.  05/25/13   Historical Provider, MD  ondansetron (ZOFRAN ODT) 8 MG disintegrating tablet Take 1 tablet (8 mg total) by mouth every 8 (eight) hours as needed for nausea or vomiting. 02/26/15  Jola Schmidt, MD  oxyCODONE-acetaminophen (PERCOCET/ROXICET) 5-325 MG per tablet Take 1 tablet by mouth every 6 (six) hours as needed for severe pain. 10/19/14   Charlett Blake, MD  oxyCODONE-acetaminophen (PERCOCET/ROXICET) 5-325 MG per tablet Take 1 tablet by mouth every 6 (six) hours as needed for severe pain. 02/07/15   Charlann Lange, PA-C  rosuvastatin (CRESTOR) 5 MG tablet Take 5 mg by mouth daily.    Historical Provider, MD  warfarin (COUMADIN) 5 MG tablet Take 7.5 mg by mouth daily at 6 PM. As directed 06/19/13   Historical Provider, MD  zolpidem (AMBIEN) 5 MG tablet Take 1 tablet (5 mg total) by mouth at bedtime as needed for sleep. 02/25/15   Jola Schmidt, MD   BP 132/101 mmHg  Pulse 100  Temp(Src) 98.2 F (36.8 C) (Oral)   Resp 18  Ht 5\' 11"  (1.803 m)  Wt 245 lb (111.131 kg)  BMI 34.19 kg/m2  SpO2 94% Physical Exam  Constitutional: He is oriented to person, place, and time. He appears well-developed and well-nourished. No distress.  HENT:  Head: Normocephalic and atraumatic.  Eyes: Conjunctivae are normal.  Neck: Neck supple. No tracheal deviation present.  Cardiovascular: Normal rate, regular rhythm and normal heart sounds.   Pulmonary/Chest: Effort normal and breath sounds normal.  Abdominal: Soft. He exhibits no distension. There is tenderness (overlying right flank). There is no rebound and no guarding.  Musculoskeletal:  3+ b/l LE pitting edema  Neurological: He is alert and oriented to person, place, and time.  Skin: Skin is warm and dry.  Psychiatric: He has a normal mood and affect.  Vitals reviewed.   ED Course  Procedures (including critical care time)  Emergency Focused Ultrasound Exam Limited Retroperitoneal Ultrasound of Kidneys  Performed and interpreted by Dr. Laneta Simmers Focused abdominal ultrasound with both kidneys imaged in transverse and longitudinal planes in real-time. Indication: flank pain Findings: bilateral kidneys present, no shadowing, + anechoic areas bilaterally with irregularly shaped right kidney and hypodensity on periphery noted Interpretation: highly abnormal exam, multiple renal cysts and septation of parenchyma with concerning hypodensity on right side. no hydronephrosis visualized.  no stones visualized  Images archived electronically  CPT Code: [phone]  Labs Review Labs Reviewed  COMPREHENSIVE METABOLIC PANEL - Abnormal; Notable for the following:    Glucose, Bld 180 (*)    BUN 67 (*)    Creatinine, Ser 5.48 (*)    Calcium 8.8 (*)    GFR calc non Af Amer 9 (*)    GFR calc Af Amer 11 (*)    All other components within normal limits  CBC - Abnormal; Notable for the following:    RBC 2.76 (*)    Hemoglobin 8.0 (*)    HCT 24.7 (*)    RDW 15.9 (*)    All  other components within normal limits  URINALYSIS, ROUTINE W REFLEX MICROSCOPIC (NOT AT [facility]) - Abnormal; Notable for the following:    Hgb urine dipstick MODERATE (*)    Protein, ur 100 (*)    All other components within normal limits  URINE MICROSCOPIC-ADD ON - Abnormal; Notable for the following:    Squamous Epithelial / LPF 0-5 (*)    Bacteria, UA RARE (*)    All other components within normal limits  URINE CULTURE  LIPASE, BLOOD  PROTIME-INR    Imaging Review Ct Renal Stone Study  12/04/2015  CLINICAL DATA:  Right lower quadrant and right flank pain with constipation and hematuria since last night. Anemia with  bilateral leg swelling for a while. EXAM: CT ABDOMEN AND PELVIS WITHOUT CONTRAST TECHNIQUE: Multidetector CT imaging of the abdomen and pelvis was performed following the standard protocol without IV contrast. COMPARISON:  Lumbar MRI 11/22/2014.  Abdominal pelvic CT 12/18/2010. FINDINGS: Lower chest: Small right-greater-than-left pleural effusions have enlarged. There is mild atelectasis at both lung bases. Cardiomegaly and a small pericardial effusion are unchanged. Hepatobiliary: As evaluated in the noncontrast state, the liver appears unremarkable per No evidence of gallstones, gallbladder wall thickening or biliary dilatation. Pancreas: Unremarkable. No pancreatic ductal dilatation or surrounding inflammatory changes. Spleen: Normal in size without focal abnormality. Adrenals/Urinary Tract: The adrenal glands appear stable without suspicious findings. There has been interval development of a large subcapsular hematoma involving the right kidney. This measures up to 2.9 cm in thickness and demonstrates high density. There is resulting compression of the right renal parenchyma. There is a complex lesion in the upper pole of the right kidney measuring up to 5.2 cm in diameter on image 29, enlarged from the prior study. This is complex with areas of increased density. There are other  smaller hyperdense lesions in the right kidney, including a 2.1 cm lesion in the upper pole on image 23 and a 2.0 cm lesion anteriorly in the mid right kidney on image 30. There is also a simple cyst in the upper pole the right kidney. There are enlarging low-density left renal lesions, measuring up to 6.5 cm in the upper pole and 7.5 cm in the lower pole. In addition, there are several small hyperdense left renal lesions as well. There is no evidence of urinary tract calculus or hydronephrosis. There is asymmetric perinephric soft tissue stranding on the right. No significant bladder abnormalities are seen; bladder assessment is limited by artifact from the patient's left hip arthroplasty. Stomach/Bowel: No evidence of bowel wall thickening, distention or surrounding inflammatory change. The are diverticular changes of the sigmoid colon. Vascular/Lymphatic: There are no enlarged abdominal or pelvic lymph nodes. Aortoiliac atherosclerosis noted. Reproductive: Mild enlargement of the prostate gland, grossly stable. Other: No ascites or peritoneal nodularity. There is a small umbilical hernia containing only fat. Musculoskeletal: No acute or significant osseous findings. There are extensive degenerative changes throughout the spine. Patient is status post left total hip arthroplasty. Chronic fragmentation of the left greater trochanter is unchanged. IMPRESSION: 1. Interval development of large right renal subcapsular hematoma with compression of the renal parenchyma. This hematoma is probably related to hemorrhage into one of several hyperdense renal cysts which have enlarged from the prior study. 2. Enlarging simple and complex renal lesions bilaterally, likely cysts based on the previous study. There is 1 in the upper pole the right kidney which is complex with heterogeneous density, measuring 5.2 cm in diameter. After the patient's subcapsular hematoma has resolved, follow up to include pre and post-contrast  imaging with CT or MRI recommended to exclude solid lesion. 3. No evidence of urinary tract calculus or hydronephrosis. 4. Additional incidental findings as above, including cardiomegaly, pleural and pericardial effusions, atherosclerosis and prostatomegaly. 5. These results were called by telephone at the time of interpretation on 12/04/2015 at 8:48 am to Dr. Leo Grosser , who verbally acknowledged these results. Electronically Signed   By: Richardean Sale M.D.   On: 12/04/2015 08:48   I have personally reviewed and evaluated these images and lab results as part of my medical decision-making.   EKG Interpretation None      MDM   Final diagnoses:  Anticoagulated on Coumadin  Right flank pain  Kidney hematoma, right, initial encounter  Renal cyst, native, hemorrhage  Acute on chronic renal failure ([place])    70 y.o. male presents with increasing right flank pain that started suddenly yesterday evening and has continued unabated. Slightly improve with pain medicine at home. Afebrile with stable vital signs on arrival. Prolonged wait in the waiting room without signs of decompensation. Bedside ultrasound shows no evidence of obstruction but has abnormally shaped polycystic kidneys with some hypodensity on the periphery and enlargement, patient has history of bladder surgery previously for to remove an oncologic lesion and would benefit from CT scan to rule out mass effect or stone. Patient has history of chronic kidney disease and last available creatinine was 3, 9 months ago. Today his creatinine is greater than 5 and his GFR is 11. It is unclear if this is new or old. Records requested from [facility] where patient is seen by nephrology.  Records reviewed from [place] [facility], last available creatinine is 4.5 with a GFR of 14, suggesting today's readings are acute on chronic kidney disease. CT scan is consistent with subcapsular hematoma on the right which is likely cause of patient's  pain and hematuria, anticoagulant on Coumadin for remote DVT. Reversed with vitamin K and [facility] for elevated INR and no contraindication to rapid reversal with active bleeding into kidney. Hospitalist was consulted for admission and will see the patient in the emergency department.   Leo Grosser, MD 12/04/15 [phone]

## 2015-12-04 NOTE — H&P (Signed)
[facility] History and Physical  Albert Stanley MWU:[id number] DOB: 25-Jun-1946 DOA: 12/03/2015   PCP: [name], MD  Specialists: Dr. Florene Glen is his nephrologist. Dr. Risa Grill is his urologist  Chief Complaint: Pain in the right flank  HPI: Albert Stanley is a 70 y.o. male with a past medical history of chronic kidney disease unknown stage, history of bladder cancer, history of diabetes on oral agents, history of DVT on anticoagulation, who presented with complaint of pain in the right flank since yesterday evening at 6 PM. The pain was 10 out of 10 in intensity. It was sudden onset. No radiation of the pain. Was associated with vomiting. No precipitating aggravating or relieving factors. Denies any fever or chills. After getting pain medications, pain is better and down to 6 out of 10 in intensity. Denies any lightheadedness. No previous episodes of same. Denies any blood in the urine. No pain with urination.  In the emergency department he was found to have a right renal subcapsular hematoma with suspicion for bleeding into a renal cyst. He will be hospitalized for further management.  Home Medications: Prior to Admission medications   Medication Sig Start Date End Date Taking? Authorizing Provider  allopurinol (ZYLOPRIM) 300 MG tablet Take 150 mg by mouth daily.     Yes Historical Provider, MD  amLODipine (NORVASC) 10 MG tablet Take 10 mg by mouth daily. 12/02/15  Yes Historical Provider, MD  calcitRIOL (ROCALTROL) 0.5 MCG capsule Take 0.5 mcg by mouth daily.  06/19/13  Yes Historical Provider, MD  cyclobenzaprine (FLEXERIL) 5 MG tablet Take 5 mg by mouth 3 (three) times daily as needed for muscle spasms.   Yes Historical Provider, MD  doxazosin (CARDURA) 4 MG tablet Take 4 mg by mouth daily. 12/02/15  Yes Historical Provider, MD  ferrous sulfate 325 (65 FE) MG tablet Take 325 mg by mouth daily with breakfast.     Yes Historical Provider, MD  fluticasone (FLONASE) 50 MCG/ACT  nasal spray Place 1 spray into both nostrils daily as needed for allergies or rhinitis.   Yes Historical Provider, MD  furosemide (LASIX) 80 MG tablet Take 80 mg by mouth daily. 12/02/15  Yes Historical Provider, MD  glipiZIDE (GLUCOTROL XL) 5 MG 24 hr tablet Take 5 mg by mouth daily.     Yes Historical Provider, MD  indapamide (LOZOL) 1.25 MG tablet Take 1.25 mg by mouth every morning.     Yes Historical Provider, MD  JANUVIA 50 MG tablet Take 25 mg by mouth daily.  05/25/13  Yes Historical Provider, MD  ondansetron (ZOFRAN ODT) 8 MG disintegrating tablet Take 1 tablet (8 mg total) by mouth every 8 (eight) hours as needed for nausea or vomiting. 02/26/15  Yes Jola Schmidt, MD  oxyCODONE-acetaminophen (PERCOCET) 7.5-325 MG tablet Take 1 tablet by mouth every 8 (eight) hours as needed. For pain 10/30/15  Yes Historical Provider, MD  oxyCODONE-acetaminophen (PERCOCET/ROXICET) 5-325 MG per tablet Take 1 tablet by mouth every 6 (six) hours as needed for severe pain. 10/19/14  Yes Charlett Blake, MD  oxymetazoline (AFRIN) 0.05 % nasal spray Place 1 spray into both nostrils 2 (two) times daily as needed for congestion.   Yes Historical Provider, MD  rosuvastatin (CRESTOR) 5 MG tablet Take 5 mg by mouth daily.   Yes Historical Provider, MD  warfarin (COUMADIN) 5 MG tablet Take 2.5-5 mg by mouth daily at 6 PM. Taking 5 on Monday, Tuesday, Wednesday, Thursday and Friday and take 2.61m on Saturday and Sunday  06/19/13  Yes Historical Provider, MD  cloNIDine (CATAPRES) 0.1 MG tablet Take 1 tablet (0.1 mg total) by mouth every 8 (eight) hours as needed (withdrawl symptoms). Patient not taking: Reported on 12/04/2015 02/25/15   Jola Schmidt, MD  zolpidem (AMBIEN) 5 MG tablet Take 1 tablet (5 mg total) by mouth at bedtime as needed for sleep. Patient not taking: Reported on 12/04/2015 02/25/15   Jola Schmidt, MD    Allergies:  Allergies  Allergen Reactions  . Meperidine Hcl     Unknown  . Penicillins     "Knocks me  out."   Has patient had a PCN reaction causing immediate rash, facial/tongue/throat swelling, SOB or lightheadedness with hypotension: yes- blacked out Has patient had a PCN reaction causing severe rash involving mucus membranes or skin necrosis: unknown Has patient had a PCN reaction that required hospitalization: at MD office Has patient had a PCN reaction occurring within the last 10 years: no If all of the above answers are "NO", then may proceed with Cephalosporin use.   . Sulfonamide Derivatives     Unknown.   . Povidone-Iodine Rash    Past Medical History: Past Medical History  Diagnosis Date  . Hypertension   . Diabetes mellitus without complication ([place])   . Asthma   . Hyperlipidemia   . DVT (deep venous thrombosis) ([place])   . Renal insufficiency   . Bladder cancer ([place]) dx'd 1990    surg only    Past Surgical History  Procedure Laterality Date  . Back surgery      2 times  . Joint replacement      hip  . Arthroscopic knee surgery    . Cystoscopy      Social History: Lives in [place] with his wife. Denies smoking currently. Quit 20 years ago. Quit drinking alcohol about 2-3 years ago. Independent with daily activities but sometimes uses a cane.  Family History:  Family History  Problem Relation Age of Onset  . Arthritis Mother   . Cancer Father   . Heart disease Father   . Hypertension Sister   . Alcohol abuse Brother   . Diabetes Daughter      Review of Systems - History obtained from the patient General ROS: positive for  - fatigue Psychological ROS: negative Ophthalmic ROS: negative ENT ROS: negative Allergy and Immunology ROS: negative Hematological and Lymphatic ROS: negative Endocrine ROS: negative Respiratory ROS: no cough, shortness of breath, or wheezing Cardiovascular ROS: no chest pain or dyspnea on exertion Gastrointestinal ROS: as in hpi Genito-Urinary ROS: as in hpi Musculoskeletal ROS: negative Neurological ROS: no TIA or stroke  symptoms Dermatological ROS: negative  Physical Examination  Filed Vitals:   12/04/15 0901 12/04/15 1012 12/04/15 1030 12/04/15 1100  BP: 156/93 163/141 152/92 146/90  Pulse: 88 85 85 81  Temp:      TempSrc:      Resp: _0 Height:      Weight:      SpO2: 97% 96% 95% 94%    BP 146/90 mmHg  Pulse 81  Temp(Src) 98.2 F (36.8 C) (Oral)  Resp 15  Ht _1  (1.803 m)  Wt 111.131 kg (245 lb)  BMI 34.19 kg/m2  SpO2 94%  General appearance: alert, cooperative, appears stated age and no distress Head: Normocephalic, without obvious abnormality, atraumatic Eyes: conjunctivae/corneas clear. PERRL, EOM's intact.  Throat: lips, mucosa, and tongue normal; teeth and gums normal Neck: no adenopathy, no carotid bruit, no JVD, supple, symmetrical,  trachea midline and thyroid not enlarged, symmetric, no tenderness/mass/nodules Resp: clear to auscultation bilaterally Cardio: regular rate and rhythm, S1, S2 normal, no murmur, click, rub or gallop GI: Soft. Slightly tender in the right flank area. No rebound, rigidity or guarding. Bowel sounds are present. No masses or organomegaly. Extremities: extremities normal, atraumatic, no cyanosis or edema Pulses: 2+ and symmetric Skin: Skin color, texture, turgor normal. No rashes or lesions Lymph nodes: Cervical, supraclavicular, and axillary nodes normal. Neurologic: Awake and alert. Oriented 3. No focal neurological deficits.  Laboratory Data: Results for orders placed or performed during the hospital encounter of 12/03/15 (from the past 48 hour(s))  Lipase, blood     Status: None   Collection Time: 12/04/15 12:22 AM  Result Value Ref Range   Lipase 23 11 - 51 U/L  Comprehensive metabolic panel     Status: Abnormal   Collection Time: 12/04/15 12:22 AM  Result Value Ref Range   Sodium 140 135 - 145 mmol/L   Potassium 4.4 3.5 - 5.1 mmol/L   Chloride 105 101 - 111 mmol/L   CO2 23 22 - 32 mmol/L   Glucose, Bld 180 (H) 65 - 99 mg/dL    BUN 67 (H) 6 - 20 mg/dL   Creatinine, Ser 5.48 (H) 0.61 - 1.24 mg/dL   Calcium 8.8 (L) 8.9 - 10.3 mg/dL   Total Protein 6.6 6.5 - 8.1 g/dL   Albumin 3.6 3.5 - 5.0 g/dL   AST 28 15 - 41 U/L   ALT 18 17 - 63 U/L   Alkaline Phosphatase 48 38 - 126 U/L   Total Bilirubin 0.5 0.3 - 1.2 mg/dL   GFR calc non Af Amer 9 (L) >60 mL/min   GFR calc Af Amer 11 (L) >60 mL/min    Comment: (NOTE) The eGFR has been calculated using the CKD EPI equation. This calculation has not been validated in all clinical situations. eGFR's persistently <60 mL/min signify possible Chronic Kidney Disease.    Anion gap 12 5 - 15  CBC     Status: Abnormal   Collection Time: 12/04/15 12:22 AM  Result Value Ref Range   WBC 7.7 4.0 - 10.5 K/uL   RBC 2.76 (L) 4.22 - 5.81 MIL/uL   Hemoglobin 8.0 (L) 13.0 - 17.0 g/dL   HCT 24.7 (L) 39.0 - 52.0 %   MCV 89.5 78.0 - 100.0 fL   MCH 29.0 26.0 - 34.0 pg   MCHC 32.4 30.0 - 36.0 g/dL   RDW 15.9 (H) 11.5 - 15.5 %   Platelets 164 150 - 400 K/uL  Urinalysis, Routine w reflex microscopic (not at [facility])     Status: Abnormal   Collection Time: 12/04/15  7:07 AM  Result Value Ref Range   Color, Urine YELLOW YELLOW   APPearance CLEAR CLEAR   Specific Gravity, Urine 1.012 1.005 - 1.030   pH 5.5 5.0 - 8.0   Glucose, UA NEGATIVE NEGATIVE mg/dL   Hgb urine dipstick MODERATE (A) NEGATIVE   Bilirubin Urine NEGATIVE NEGATIVE   Ketones, ur NEGATIVE NEGATIVE mg/dL   Protein, ur 100 (A) NEGATIVE mg/dL   Nitrite NEGATIVE NEGATIVE   Leukocytes, UA NEGATIVE NEGATIVE  Urine microscopic-add on     Status: Abnormal   Collection Time: 12/04/15  7:07 AM  Result Value Ref Range   Squamous Epithelial / LPF 0-5 (A) NONE SEEN   WBC, UA 0-5 0 - 5 WBC/hpf   RBC / HPF 0-5 0 - 5 RBC/hpf   Bacteria,  UA RARE (A) NONE SEEN  Protime-INR     Status: Abnormal   Collection Time: 12/04/15  8:06 AM  Result Value Ref Range   Prothrombin Time 23.1 (H) 11.6 - 15.2 seconds   INR 2.15 (H) 0.00 - 1.49     Radiology Reports: Ct Renal Stone Study  12/04/2015  CLINICAL DATA:  Right lower quadrant and right flank pain with constipation and hematuria since last night. Anemia with bilateral leg swelling for a while. EXAM: CT ABDOMEN AND PELVIS WITHOUT CONTRAST TECHNIQUE: Multidetector CT imaging of the abdomen and pelvis was performed following the standard protocol without IV contrast. COMPARISON:  Lumbar MRI 11/22/2014.  Abdominal pelvic CT 12/18/2010. FINDINGS: Lower chest: Small right-greater-than-left pleural effusions have enlarged. There is mild atelectasis at both lung bases. Cardiomegaly and a small pericardial effusion are unchanged. Hepatobiliary: As evaluated in the noncontrast state, the liver appears unremarkable per No evidence of gallstones, gallbladder wall thickening or biliary dilatation. Pancreas: Unremarkable. No pancreatic ductal dilatation or surrounding inflammatory changes. Spleen: Normal in size without focal abnormality. Adrenals/Urinary Tract: The adrenal glands appear stable without suspicious findings. There has been interval development of a large subcapsular hematoma involving the right kidney. This measures up to 2.9 cm in thickness and demonstrates high density. There is resulting compression of the right renal parenchyma. There is a complex lesion in the upper pole of the right kidney measuring up to 5.2 cm in diameter on image 29, enlarged from the prior study. This is complex with areas of increased density. There are other smaller hyperdense lesions in the right kidney, including a 2.1 cm lesion in the upper pole on image 23 and a 2.0 cm lesion anteriorly in the mid right kidney on image 30. There is also a simple cyst in the upper pole the right kidney. There are enlarging low-density left renal lesions, measuring up to 6.5 cm in the upper pole and 7.5 cm in the lower pole. In addition, there are several small hyperdense left renal lesions as well. There is no evidence of  urinary tract calculus or hydronephrosis. There is asymmetric perinephric soft tissue stranding on the right. No significant bladder abnormalities are seen; bladder assessment is limited by artifact from the patient's left hip arthroplasty. Stomach/Bowel: No evidence of bowel wall thickening, distention or surrounding inflammatory change. The are diverticular changes of the sigmoid colon. Vascular/Lymphatic: There are no enlarged abdominal or pelvic lymph nodes. Aortoiliac atherosclerosis noted. Reproductive: Mild enlargement of the prostate gland, grossly stable. Other: No ascites or peritoneal nodularity. There is a small umbilical hernia containing only fat. Musculoskeletal: No acute or significant osseous findings. There are extensive degenerative changes throughout the spine. Patient is status post left total hip arthroplasty. Chronic fragmentation of the left greater trochanter is unchanged. IMPRESSION: 1. Interval development of large right renal subcapsular hematoma with compression of the renal parenchyma. This hematoma is probably related to hemorrhage into one of several hyperdense renal cysts which have enlarged from the prior study. 2. Enlarging simple and complex renal lesions bilaterally, likely cysts based on the previous study. There is 1 in the upper pole the right kidney which is complex with heterogeneous density, measuring 5.2 cm in diameter. After the patient's subcapsular hematoma has resolved, follow up to include pre and post-contrast imaging with CT or MRI recommended to exclude solid lesion. 3. No evidence of urinary tract calculus or hydronephrosis. 4. Additional incidental findings as above, including cardiomegaly, pleural and pericardial effusions, atherosclerosis and prostatomegaly. 5. These results were called  by telephone at the time of interpretation on 12/04/2015 at 8:48 am to Dr. Leo Grosser , who verbally acknowledged these results. Electronically Signed   By: Richardean Sale  M.D.   On: 12/04/2015 08:48     Problem List  Principal Problem:   Renal hematoma, right Active Problems:   CORONARY ATHEROSCLEROSIS NATIVE CORONARY ARTERY   CKD (chronic kidney disease), stage IV ([name])   ARF (acute renal failure) ([name])   Normocytic anemia   Anticoagulant long-term use   History of DVT (deep vein thrombosis)   Renal hematoma   Assessment: This is a 70 year old African-American male with past medical history as stated earlier, who presents with right flank pain. He is found to have a right renal subcapsular hematoma which accounts for his symptoms. He is on anticoagulation, which could be the reason for his hematoma. He has a complex lesion in the right kidney as well apart from his renal cysts.  Plan: #1 Right renal subcapsular hematoma: Monitor hemoglobin closely, transfuse as needed. Discussed with Dr. Matilde Sprang with urology. No indication for surgical intervention currently. If hemoglobin drops significantly, he may need repeat imaging studies and possibly consult IR for embolization. Anticoagulation will be held.  #2 Normocytic anemia: Likely due to chronic kidney disease. Acute blood loss cannot be ruled out. Monitor hemoglobins closely. Transfuse as needed.  #3 acute on chronic kidney disease, possibly stage III: Apparently, his creatinine was 4.5, just about a month ago. He does have lower extremity edema. We will consult nephrology to assist with management. He is noted to be on diuretics at home, which will be held for now. We will let nephrology address this issue. Monitor urine output.  #4 history of for lower extremity DVTs, on long-term anticoagulation: He has had multiple DVTs in the past and is on Coumadin for same. INR is 2.15. He was given vitamin K and Kaycentra. Hold his Coumadin for now due to active bleeding. Lower extremity venous Dopplers will be ordered to check if he has new DVTs. Decision regarding resumption of anticoagulation will need to be  made based on patient's study results as well as clinical status in the next 1 to 2 days.  #5 history of diabetes mellitus type 2: Sliding scale insulin coverage will be initiated. Hold his oral agents. Check HbA1c.  #6 Right kidney complex lesion: This will need to be further characterized with a repeat CT or MRI after acute episode has subsided, preferably after a few months. He will need to follow-up with his urologist in the next few weeks.  #7 History of mild coronary artery disease based on cardiac catheterization in 2002: He had a low-risk stress test for preoperative evaluation in 2011. Stable. Continue home medications.  #8 History of a essential. Hypertension: Monitor blood pressures closely. Hydralazine as needed.   DVT Prophylaxis: Can utilize SCDs if LE venous Doppler studies are negative for acute DVT Code Status: Full code Family Communication: Discussed with the patient and his daughter  Disposition Plan: Admit to telemetry   Further management decisions will depend on results of further testing and patient's response to treatment.   [facility]  [facility] Pager [phone]  If 7PM-7AM, please contact night-coverage [email or web address] Password [facility]  12/04/2015, 12:22 PM

## 2015-12-04 NOTE — ED Notes (Signed)
Bed: [id number] Expected date:  Expected time:  Means of arrival:  Comments: Cleaning almost ready

## 2015-12-05 ENCOUNTER — Ambulatory Visit (HOSPITAL_COMMUNITY): Payer: Medicare Other

## 2015-12-05 ENCOUNTER — Inpatient Hospital Stay (HOSPITAL_COMMUNITY): Payer: Medicare Other

## 2015-12-05 DIAGNOSIS — R609 Edema, unspecified: Secondary | ICD-10-CM

## 2015-12-05 DIAGNOSIS — E1122 Type 2 diabetes mellitus with diabetic chronic kidney disease: Secondary | ICD-10-CM

## 2015-12-05 LAB — COMPREHENSIVE METABOLIC PANEL
ALT: 16 U/L — AB (ref 17–63)
AST: 26 U/L (ref 15–41)
Albumin: 3.4 g/dL — ABNORMAL LOW (ref 3.5–5.0)
Alkaline Phosphatase: 41 U/L (ref 38–126)
Anion gap: 13 (ref 5–15)
BILIRUBIN TOTAL: 0.8 mg/dL (ref 0.3–1.2)
BUN: 65 mg/dL — ABNORMAL HIGH (ref 6–20)
CHLORIDE: 109 mmol/L (ref 101–111)
CO2: 21 mmol/L — ABNORMAL LOW (ref 22–32)
CREATININE: 6.04 mg/dL — AB (ref 0.61–1.24)
Calcium: 9.3 mg/dL (ref 8.9–10.3)
GFR, EST AFRICAN AMERICAN: 10 mL/min — AB (ref >60)
GFR, EST NON AFRICAN AMERICAN: 8 mL/min — AB (ref >60)
Glucose, Bld: 77 mg/dL (ref 65–99)
Potassium: 4.4 mmol/L (ref 3.5–5.1)
Sodium: 143 mmol/L (ref 135–145)
TOTAL PROTEIN: 6.5 g/dL (ref 6.5–8.1)

## 2015-12-05 LAB — CBC
HCT: 22.3 % — ABNORMAL LOW (ref 39.0–52.0)
HCT: 22.3 % — ABNORMAL LOW (ref 39.0–52.0)
HCT: 23 % — ABNORMAL LOW (ref 39.0–52.0)
Hemoglobin: 7.3 g/dL — ABNORMAL LOW (ref 13.0–17.0)
Hemoglobin: 7.5 g/dL — ABNORMAL LOW (ref 13.0–17.0)
Hemoglobin: 7.5 g/dL — ABNORMAL LOW (ref 13.0–17.0)
MCH: 28.5 pg (ref 26.0–34.0)
MCH: 28.6 pg (ref 26.0–34.0)
MCH: 29.6 pg (ref 26.0–34.0)
MCHC: 32.6 g/dL (ref 30.0–36.0)
MCHC: 32.7 g/dL (ref 30.0–36.0)
MCHC: 33.6 g/dL (ref 30.0–36.0)
MCV: 87.1 fL (ref 78.0–100.0)
MCV: 87.8 fL (ref 78.0–100.0)
MCV: 88.1 fL (ref 78.0–100.0)
PLATELETS: 164 10*3/uL (ref 150–400)
PLATELETS: 167 10*3/uL (ref 150–400)
PLATELETS: 170 10*3/uL (ref 150–400)
RBC: 2.53 MIL/uL — ABNORMAL LOW (ref 4.22–5.81)
RBC: 2.56 MIL/uL — AB (ref 4.22–5.81)
RBC: 2.62 MIL/uL — AB (ref 4.22–5.81)
RDW: 15.6 % — AB (ref 11.5–15.5)
RDW: 15.7 % — AB (ref 11.5–15.5)
RDW: 15.7 % — AB (ref 11.5–15.5)
WBC: 8.6 10*3/uL (ref 4.0–10.5)
WBC: 8.8 10*3/uL (ref 4.0–10.5)
WBC: 9 10*3/uL (ref 4.0–10.5)

## 2015-12-05 LAB — GLUCOSE, CAPILLARY
GLUCOSE-CAPILLARY: 93 mg/dL (ref 65–99)
Glucose-Capillary: 74 mg/dL (ref 65–99)
Glucose-Capillary: 111 mg/dL — ABNORMAL HIGH (ref 65–99)
Glucose-Capillary: 92 mg/dL (ref 65–99)

## 2015-12-05 LAB — PROTIME-INR
INR: 1.41 (ref 0.00–1.49)
INR: 1.53 — ABNORMAL HIGH (ref 0.00–1.49)
INR: 1.45 (ref 0.00–1.49)
PROTHROMBIN TIME: 17.4 s — AB (ref 11.6–15.2)
PROTHROMBIN TIME: 17.7 s — AB (ref 11.6–15.2)
Prothrombin Time: 17.9 seconds — ABNORMAL HIGH (ref 11.6–15.2)

## 2015-12-05 LAB — URINE CULTURE: CULTURE: NO GROWTH

## 2015-12-05 MED ORDER — METOLAZONE 5 MG PO TABS
5.0000 mg | ORAL_TABLET | Freq: Two times a day (BID) | ORAL | Status: DC
  Administered 2015-12-05 – 2015-12-07 (×6): 5 mg via ORAL
  Filled 2015-12-05 (×7): qty 1

## 2015-12-05 MED ORDER — FUROSEMIDE 10 MG/ML IJ SOLN
160.0000 mg | Freq: Four times a day (QID) | INTRAMUSCULAR | Status: DC
  Administered 2015-12-05 – 2015-12-07 (×11): 160 mg via INTRAVENOUS
  Filled 2015-12-05 (×3): qty 16
  Filled 2015-12-05 (×2): qty 10
  Filled 2015-12-05: qty 4
  Filled 2015-12-05: qty 16
  Filled 2015-12-05: qty 10
  Filled 2015-12-05 (×2): qty 16
  Filled 2015-12-05: qty 10
  Filled 2015-12-05 (×2): qty 16
  Filled 2015-12-05: qty 10

## 2015-12-05 MED ORDER — ALUM & MAG HYDROXIDE-SIMETH 200-200-20 MG/5ML PO SUSP
30.0000 mL | Freq: Four times a day (QID) | ORAL | Status: DC | PRN
  Administered 2015-12-05 – 2015-12-16 (×6): 30 mL via ORAL
  Filled 2015-12-05 (×6): qty 30

## 2015-12-05 MED ORDER — FAMOTIDINE 20 MG PO TABS
20.0000 mg | ORAL_TABLET | Freq: Two times a day (BID) | ORAL | Status: DC | PRN
  Administered 2015-12-14: 20 mg via ORAL
  Filled 2015-12-05 (×3): qty 1

## 2015-12-05 MED ORDER — VITAMIN K1 10 MG/ML IJ SOLN
5.0000 mg | Freq: Once | INTRAMUSCULAR | Status: AC
  Administered 2015-12-05: 5 mg via INTRAVENOUS
  Filled 2015-12-05: qty 0.5

## 2015-12-05 NOTE — Progress Notes (Signed)
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

## 2015-12-05 NOTE — Progress Notes (Signed)
  [name] Progress Note   Subjective: 1100 UOP recorded yesterday  Filed Vitals:   12/04/15 1757 12/04/15 1825 12/04/15 2135 12/05/15 0532  BP: 164/97 162/95 173/95 159/80  Pulse: 97 96 95 101  Temp:  100.5 F (38.1 C) 98.3 F (36.8 C) 98.7 F (37.1 C)  TempSrc:   Oral Oral  Resp: 17 18 18 18   Height:  5\' 11"  (1.803 m)    Weight:  117.073 kg (258 lb 1.6 oz)    SpO2: 97% 96% 95% 94%    Inpatient medications: . amLODipine  10 mg Oral Daily  . calcitRIOL  0.5 mcg Oral Daily  . docusate sodium  100 mg Oral BID  . doxazosin  4 mg Oral Daily  . furosemide  80 mg Intravenous 3 times per day  . insulin aspart  0-9 Units Subcutaneous TID WC  . sodium chloride flush  3 mL Intravenous Q12H   . sodium chloride 10 mL/hr at 12/05/15 [id number]   acetaminophen **OR** acetaminophen, albuterol, cyclobenzaprine, hydrALAZINE, HYDROmorphone (DILAUDID) injection, ondansetron **OR** ondansetron (ZOFRAN) IV, oxyCODONE  Exam: Alert obese AAM no distress, calm JVP 12-14 cm Chest rales bilat bases RRR no MRG aBd obese , soft ntnd no mass or ascites Ext 2+ pitting pretib edema bilat LE's No UE edema, no sacral edema Neuro no asterixis, Ox 3, calm  UA - 0-5 rbc/ wbc/ epi, prot 100 , 1.012 Na 140 K 4.4 CO2 23 BUN 67 Creat 5.48 Alb 3.6 LFT's ok Hb 8.0 WBC 7k CXR 2/22 > bilat CHF, significant   Home meds > allopurinol, norvasc, Rocaltrol, Flexeril, Cardura, iron pills, flonase, Lasix 80/d, glipizide, indapamide, januvia, percocet prn/ oxycodone prn, rosuvastatin, warfarin, clonidine, zolpidem   Assessment: 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 Creat up to 6.0 today, baseline 3.5- 4.0  2 Pulm edema/ vol overload - not diuresing well overnight on 80mg  IV lasix, not in distress but has sig pulm edema 3 R flank pain/ subcapsular hematoma - rec'd vit K and Kcentra , per prim 4 HTN cont norvasc/ cardura 5 Anemia of CKD- Hb 7.5, check Fe, avoid transfusion w CHF already 6 Hx  bladder cancer 7 Renal cysts, some complex lesions - prob needs MRI when more stable   Plan - increase diuretics to max lasix IV and added po metalozone.  If CHF cannot be corrected with diuresis will need acute dialysis.     Kelly Splinter MD [facility] pager [phone]    cell [phone] 12/05/2015, 8:46 AM    Recent Labs Lab 12/04/15 0022 12/05/15 0616  NA 140 143  K 4.4 4.4  CL 105 109  CO2 23 21*  GLUCOSE 180* 77  BUN 67* 65*  CREATININE 5.48* 6.04*  CALCIUM 8.8* 9.3    Recent Labs Lab 12/04/15 0022 12/05/15 0616  AST 28 26  ALT 18 16*  ALKPHOS 48 41  BILITOT 0.5 0.8  PROT 6.6 6.5  ALBUMIN 3.6 3.4*    Recent Labs Lab 12/04/15 1834 12/05/15 0021 12/05/15 0616  WBC 8.1 8.6 8.8  HGB 7.7* 7.5* 7.5*  HCT 23.3* 22.3* 23.0*  MCV 87.9 88.1 87.8  PLT 159 170 167

## 2015-12-05 NOTE — Progress Notes (Signed)
  Echocardiogram 2D Echocardiogram has been performed.  Albert Stanley 12/05/2015, 2:06 PM

## 2015-12-05 NOTE — Evaluation (Signed)
Physical Therapy Evaluation Patient Details Name: Albert Stanley MRN: [id number] DOB: December 20, 1945 Today's Date: 12/05/2015   History of Present Illness  70 yo male admitted with right renal hematoma. hx of htn, dm, athma, dvt, bladder cancer, renal insufficiency, ckd.   Clinical Impression  On eval, pt required Min assist for mobility-walked ~200 feet while holding on to hallway handrail for stability. Intermittent assist needed to stabilize. Pt states he does not walk a lot due to knee deficits. Do not anticipate any follow up PT needs. Recommend supervision for mobility/OOB and cane for stability.     Follow Up Recommendations Supervision for mobility/OOB    Equipment Recommendations  None recommended by PT    Recommendations for Other Services       Precautions / Restrictions Precautions Precautions: Fall Restrictions Weight Bearing Restrictions: No      Mobility  Bed Mobility Overal bed mobility: Needs Assistance Bed Mobility: Supine to Sit     Supine to sit: Modified independent (Device/Increase time)     General bed mobility comments: increased time  Transfers Overall transfer level: Needs assistance   Transfers: Sit to/from Stand Sit to Stand: Min guard         General transfer comment: close guard for safety  Ambulation/Gait Ambulation/Gait assistance: Min assist Ambulation Distance (Feet): 200 Feet Assistive device:  (hallway handrail) Gait Pattern/deviations: Step-through pattern;Decreased stride length;Drifts right/left;Staggering right;Staggering left     General Gait Details: intermittent assist to stabilize, especially without support of handrail. slow gait speed. fatigues fairly easily  Stairs            Wheelchair Mobility    Modified Rankin (Stroke Patients Only)       Balance Overall balance assessment: Needs assistance         Standing balance support: During functional activity Standing balance-Leahy Scale: Fair                                Pertinent Vitals/Pain Pain Assessment: 0-10 Pain Score: 7  Pain Location: R side/back Pain Descriptors / Indicators: Sore Pain Intervention(s): Monitored during session;Repositioned    Home Living Family/patient expects to be discharged to:: Private residence Living Arrangements: Spouse/significant other;Children   Type of Home: House Home Access: Stairs to enter   Technical brewer of Steps: 1 step Home Layout: Multi-level;Bed/bath upstairs Home Equipment: Environmental consultant - 2 wheels      Prior Function Level of Independence: Independent               Hand Dominance        Extremity/Trunk Assessment   Upper Extremity Assessment: Generalized weakness           Lower Extremity Assessment: Generalized weakness      Cervical / Trunk Assessment: Normal  Communication   Communication: No difficulties  Cognition Arousal/Alertness: Awake/alert Behavior During Therapy: WFL for tasks assessed/performed Overall Cognitive Status: Within Functional Limits for tasks assessed                      General Comments      Exercises        Assessment/Plan    PT Assessment Patient needs continued PT services  PT Diagnosis Difficulty walking;Acute pain   PT Problem List Decreased balance;Decreased mobility;Decreased activity tolerance;Pain  PT Treatment Interventions Gait training;Functional mobility training;Therapeutic activities;Patient/family education;Balance training;Therapeutic exercise;DME instruction   PT Goals (Current goals can be found in the Care Plan section)  Acute Rehab PT Goals Patient Stated Goal: none stated PT Goal Formulation: With patient Time For Goal Achievement: 12/19/15 Potential to Achieve Goals: Good    Frequency Min 3X/week   Barriers to discharge        Co-evaluation               End of Session Equipment Utilized During Treatment: Gait belt Activity Tolerance: Patient limited  by fatigue Patient left: in chair;with call bell/phone within reach           Time: 1135-1149 PT Time Calculation (min) (ACUTE ONLY): 14 min   Charges:   PT Evaluation $PT Eval Moderate Complexity: 1 Procedure     PT G Codes:        Weston Anna, MPT Pager: [phone]

## 2015-12-05 NOTE — Progress Notes (Signed)
VASCULAR LAB PRELIMINARY  PRELIMINARY  PRELIMINARY  PRELIMINARY  Bilateral lower extremity venous duplex completed.    Bilateral:  No evidence of DVT, superficial thrombosis, or Baker's Cyst.   Janifer Adie, RVT, RDMS 12/05/2015, 10:05 AM

## 2015-12-05 NOTE — Progress Notes (Signed)
[facility] PROGRESS NOTE  FIDEL TOTA [id number] DOB: 1946/01/16 DOA: 12/03/2015  PCP: [name], MD  Brief HPI: 70 year old African-American male with a past medical history of chronic kidney disease, bladder cancer, history of diabetes, history of DVT who was on warfarin presented with right flank pain and was found to have right renal subcapsular hematoma. Patient was hospitalized for further management. He was also noted to have worsening renal failure.  Past medical history:  Past Medical History  Diagnosis Date  . Hypertension   . Diabetes mellitus without complication ([place])   . Asthma   . Hyperlipidemia   . DVT (deep venous thrombosis) ([place])   . Renal insufficiency   . Bladder cancer ([place]) dx'd 1990    surg only    Consultants: Nephrology. Phone discussion with urology.  Procedures: Venous Doppler of the lower extremities is pending  Antibiotics: None  Subjective: Patient feels better this morning. Pain in the right flank is not as severe as yesterday. Denies any nausea, vomiting. Making urine. No diarrhea. Gets short of breath with exertion.  Objective: Vital Signs  Filed Vitals:   12/04/15 1757 12/04/15 1825 12/04/15 2135 12/05/15 0532  BP: 164/97 162/95 173/95 159/80  Pulse: 97 96 95 101  Temp:  100.5 F (38.1 C) 98.3 F (36.8 C) 98.7 F (37.1 C)  TempSrc:   Oral Oral  Resp: 17 18 18 18   Height:  5\' 11"  (1.803 m)    Weight:  117.073 kg (258 lb 1.6 oz)    SpO2: 97% 96% 95% 94%    Intake/Output Summary (Last 24 hours) at 12/05/15 0900 Last data filed at 12/05/15 [id number]  Gross per 24 hour  Intake  226.5 ml  Output   1100 ml  Net -873.5 ml   Filed Weights   12/04/15 0000 12/04/15 1825  Weight: 111.131 kg (245 lb) 117.073 kg (258 lb 1.6 oz)    General appearance: alert, cooperative, appears stated age and no distress Neck: no adenopathy, no carotid bruit, no JVD, supple, symmetrical, trachea midline and thyroid not enlarged,  symmetric, no tenderness/mass/nodules Resp: Diminished air entry at the bases. A few crackles. No wheezing. Cardio: regular rate and rhythm, S1, S2 normal, no murmur, click, rub or gallop GI: soft, non-tender; bowel sounds normal; no masses,  no organomegaly Extremities: Edema is noted. Bilateral lower extremities 1-2+ Neurologic: Awake and alert. Oriented 3. No focal neurological deficits.  Lab Results:  Basic Metabolic Panel:  Recent Labs Lab 12/04/15 0022 12/05/15 0616  NA 140 143  K 4.4 4.4  CL 105 109  CO2 23 21*  GLUCOSE 180* 77  BUN 67* 65*  CREATININE 5.48* 6.04*  CALCIUM 8.8* 9.3   Liver Function Tests:  Recent Labs Lab 12/04/15 0022 12/05/15 0616  AST 28 26  ALT 18 16*  ALKPHOS 48 41  BILITOT 0.5 0.8  PROT 6.6 6.5  ALBUMIN 3.6 3.4*    Recent Labs Lab 12/04/15 0022  LIPASE 23   CBC:  Recent Labs Lab 12/04/15 0022 12/04/15 1834 12/05/15 0021 12/05/15 0616  WBC 7.7 8.1 8.6 8.8  HGB 8.0* 7.7* 7.5* 7.5*  HCT 24.7* 23.3* 22.3* 23.0*  MCV 89.5 87.9 88.1 87.8  PLT 164 159 170 167   CBG:  Recent Labs Lab 12/04/15 1238 12/04/15 1907 12/04/15 2131 12/05/15 0725  GLUCAP 73 92 84 74    No results found for this or any previous visit (from the past 240 hour(s)).    Studies/Results: Dg Chest Port 1  View  12/04/2015  CLINICAL DATA:  Volume overload EXAM: PORTABLE CHEST 1 VIEW COMPARISON:  02/25/2015 FINDINGS: Cardiac shadow is enlarged. Pulmonary vascular congestion with mild pulmonary edema is noted. No sizable effusion is seen. No bony abnormality is noted. IMPRESSION: Changes consistent with CHF consistent with the given clinical history. Electronically Signed   By: Inez Catalina M.D.   On: 12/04/2015 16:37   Ct Renal Stone Study  12/04/2015  CLINICAL DATA:  Right lower quadrant and right flank pain with constipation and hematuria since last night. Anemia with bilateral leg swelling for a while. EXAM: CT ABDOMEN AND PELVIS WITHOUT CONTRAST  TECHNIQUE: Multidetector CT imaging of the abdomen and pelvis was performed following the standard protocol without IV contrast. COMPARISON:  Lumbar MRI 11/22/2014.  Abdominal pelvic CT 12/18/2010. FINDINGS: Lower chest: Small right-greater-than-left pleural effusions have enlarged. There is mild atelectasis at both lung bases. Cardiomegaly and a small pericardial effusion are unchanged. Hepatobiliary: As evaluated in the noncontrast state, the liver appears unremarkable per No evidence of gallstones, gallbladder wall thickening or biliary dilatation. Pancreas: Unremarkable. No pancreatic ductal dilatation or surrounding inflammatory changes. Spleen: Normal in size without focal abnormality. Adrenals/Urinary Tract: The adrenal glands appear stable without suspicious findings. There has been interval development of a large subcapsular hematoma involving the right kidney. This measures up to 2.9 cm in thickness and demonstrates high density. There is resulting compression of the right renal parenchyma. There is a complex lesion in the upper pole of the right kidney measuring up to 5.2 cm in diameter on image 29, enlarged from the prior study. This is complex with areas of increased density. There are other smaller hyperdense lesions in the right kidney, including a 2.1 cm lesion in the upper pole on image 23 and a 2.0 cm lesion anteriorly in the mid right kidney on image 30. There is also a simple cyst in the upper pole the right kidney. There are enlarging low-density left renal lesions, measuring up to 6.5 cm in the upper pole and 7.5 cm in the lower pole. In addition, there are several small hyperdense left renal lesions as well. There is no evidence of urinary tract calculus or hydronephrosis. There is asymmetric perinephric soft tissue stranding on the right. No significant bladder abnormalities are seen; bladder assessment is limited by artifact from the patient's left hip arthroplasty. Stomach/Bowel: No  evidence of bowel wall thickening, distention or surrounding inflammatory change. The are diverticular changes of the sigmoid colon. Vascular/Lymphatic: There are no enlarged abdominal or pelvic lymph nodes. Aortoiliac atherosclerosis noted. Reproductive: Mild enlargement of the prostate gland, grossly stable. Other: No ascites or peritoneal nodularity. There is a small umbilical hernia containing only fat. Musculoskeletal: No acute or significant osseous findings. There are extensive degenerative changes throughout the spine. Patient is status post left total hip arthroplasty. Chronic fragmentation of the left greater trochanter is unchanged. IMPRESSION: 1. Interval development of large right renal subcapsular hematoma with compression of the renal parenchyma. This hematoma is probably related to hemorrhage into one of several hyperdense renal cysts which have enlarged from the prior study. 2. Enlarging simple and complex renal lesions bilaterally, likely cysts based on the previous study. There is 1 in the upper pole the right kidney which is complex with heterogeneous density, measuring 5.2 cm in diameter. After the patient's subcapsular hematoma has resolved, follow up to include pre and post-contrast imaging with CT or MRI recommended to exclude solid lesion. 3. No evidence of urinary tract calculus or hydronephrosis. 4.  Additional incidental findings as above, including cardiomegaly, pleural and pericardial effusions, atherosclerosis and prostatomegaly. 5. These results were called by telephone at the time of interpretation on 12/04/2015 at 8:48 am to Dr. Leo Grosser , who verbally acknowledged these results. Electronically Signed   By: Richardean Sale M.D.   On: 12/04/2015 08:48    Medications:  Scheduled: . amLODipine  10 mg Oral Daily  . calcitRIOL  0.5 mcg Oral Daily  . docusate sodium  100 mg Oral BID  . doxazosin  4 mg Oral Daily  . furosemide  80 mg Intravenous 3 times per day  . insulin  aspart  0-9 Units Subcutaneous TID WC  . sodium chloride flush  3 mL Intravenous Q12H   Continuous: . sodium chloride 10 mL/hr at 12/05/15 [id number]   [id number] **OR** acetaminophen, albuterol, cyclobenzaprine, hydrALAZINE, HYDROmorphone (DILAUDID) injection, ondansetron **OR** ondansetron (ZOFRAN) IV, oxyCODONE  Assessment/Plan:  Principal Problem:   Renal hematoma, right Active Problems:   CORONARY ATHEROSCLEROSIS NATIVE CORONARY ARTERY   CKD (chronic kidney disease), stage IV ([name])   ARF (acute renal failure) ([name])   Normocytic anemia   Anticoagulant long-term use   History of DVT (deep vein thrombosis)   Renal hematoma    Right renal subcapsular hematoma Patient's pain has improved. Hemoglobin noted to be slightly lower today compared to yesterday. Continue to monitor for now. Consider transfusion if his dyspnea does not improve with diuresis. Discussed at the time of admission with Dr. Matilde Sprang with urology. No indication for surgical intervention currently. If hemoglobin drops significantly, he may need repeat imaging studies and possibly consult IR for embolization. Anticoagulation will be held. INR was reversed with vitamin K and Kcentra.  Normocytic anemia secondary to chronic kidney disease as well as acute blood loss Hemoglobin has trended down but has been stable. Some of this is definitely due to his hematoma. We will transfuse if his dyspnea does not improve with the diuresis. Continue to monitor hemoglobin.   Acute on chronic kidney disease, possibly stage III Patient's creatinine is slightly worse this morning. However, he has made more than 1000cc of urine over 24 hours. Appreciate nephrology input. Patient is now on intravenous Lasix. He does appear to be volume overloaded.   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 INR was 2.15. He was given vitamin K and Kcentra.  Holding his Coumadin for now due to active bleeding. Lower extremity venous Dopplers ordered to check if he has new DVTs. Decision regarding resumption of anticoagulation will need to be made based on patient's study results as well as clinical status in the next 1 to 2 days.  History of diabetes mellitus type 2 Sliding scale insulin coverage will be continued. CBG is noted to be slightly low this morning. Holding his oral agents. HbA1c is pending.  Right kidney complex lesion This will need to be further characterized with a repeat CT or MRI after acute episode has subsided, preferably after a few months. He will need to follow-up with his urologist in the next few weeks.  History of mild coronary artery disease based on cardiac catheterization in 2002 He had a low-risk stress test for preoperative evaluation in 2011. Stable. Continue home medications.  History of essential Hypertension Monitor blood pressures closely. Hydralazine as needed. Continue amlodipine.   DVT Prophylaxis: Can use SCDs if Doppler studies are negative for DVT. Cannot use anticoagulation due to bleeding.  Code Status: Full code  Family Communication: Discussed with the patient  Disposition Plan: Continue management as outlined above.    LOS: 1 day   [facility] Pager [phone] 12/05/2015, 9:00 AM  If 7PM-7AM, please contact night-coverage at [email or web address], password [facility]

## 2015-12-06 ENCOUNTER — Encounter (HOSPITAL_COMMUNITY): Payer: Self-pay | Admitting: Physician Assistant

## 2015-12-06 ENCOUNTER — Other Ambulatory Visit: Payer: Self-pay

## 2015-12-06 DIAGNOSIS — E8770 Fluid overload, unspecified: Secondary | ICD-10-CM | POA: Diagnosis present

## 2015-12-06 DIAGNOSIS — I5022 Chronic systolic (congestive) heart failure: Secondary | ICD-10-CM

## 2015-12-06 DIAGNOSIS — I5021 Acute systolic (congestive) heart failure: Secondary | ICD-10-CM

## 2015-12-06 DIAGNOSIS — N184 Chronic kidney disease, stage 4 (severe): Secondary | ICD-10-CM

## 2015-12-06 LAB — BASIC METABOLIC PANEL
ANION GAP: 13 (ref 5–15)
BUN: 73 mg/dL — ABNORMAL HIGH (ref 6–20)
CALCIUM: 9 mg/dL (ref 8.9–10.3)
CO2: 22 mmol/L (ref 22–32)
Chloride: 103 mmol/L (ref 101–111)
Creatinine, Ser: 6.59 mg/dL — ABNORMAL HIGH (ref 0.61–1.24)
GFR, EST AFRICAN AMERICAN: 9 mL/min — AB (ref >60)
GFR, EST NON AFRICAN AMERICAN: 8 mL/min — AB (ref >60)
GLUCOSE: 113 mg/dL — AB (ref 65–99)
POTASSIUM: 4 mmol/L (ref 3.5–5.1)
SODIUM: 138 mmol/L (ref 135–145)

## 2015-12-06 LAB — CBC
HCT: 21.6 % — ABNORMAL LOW (ref 39.0–52.0)
Hemoglobin: 7.1 g/dL — ABNORMAL LOW (ref 13.0–17.0)
MCH: 28.5 pg (ref 26.0–34.0)
MCHC: 32.9 g/dL (ref 30.0–36.0)
MCV: 86.7 fL (ref 78.0–100.0)
PLATELETS: 165 10*3/uL (ref 150–400)
RBC: 2.49 MIL/uL — AB (ref 4.22–5.81)
RDW: 15.3 % (ref 11.5–15.5)
WBC: 9.5 10*3/uL (ref 4.0–10.5)

## 2015-12-06 LAB — HEMOGLOBIN A1C
Hgb A1c MFr Bld: 5.8 % — ABNORMAL HIGH (ref 4.8–5.6)
Mean Plasma Glucose: 120 mg/dL

## 2015-12-06 LAB — PROTIME-INR
INR: 1.53 — AB (ref 0.00–1.49)
PROTHROMBIN TIME: 17.9 s — AB (ref 11.6–15.2)

## 2015-12-06 LAB — GLUCOSE, CAPILLARY
GLUCOSE-CAPILLARY: 104 mg/dL — AB (ref 65–99)
GLUCOSE-CAPILLARY: 69 mg/dL (ref 65–99)
Glucose-Capillary: 153 mg/dL — ABNORMAL HIGH (ref 65–99)
Glucose-Capillary: 202 mg/dL — ABNORMAL HIGH (ref 65–99)

## 2015-12-06 MED ORDER — ASPIRIN 81 MG PO CHEW
81.0000 mg | CHEWABLE_TABLET | Freq: Once | ORAL | Status: AC
Start: 2015-12-06 — End: 2015-12-06
  Administered 2015-12-06: 81 mg via ORAL
  Filled 2015-12-06: qty 1

## 2015-12-06 MED ORDER — CARVEDILOL 3.125 MG PO TABS
3.1250 mg | ORAL_TABLET | Freq: Two times a day (BID) | ORAL | Status: DC
  Administered 2015-12-06: 3.125 mg via ORAL
  Filled 2015-12-06: qty 1

## 2015-12-06 MED ORDER — ATORVASTATIN CALCIUM 40 MG PO TABS
40.0000 mg | ORAL_TABLET | Freq: Every day | ORAL | Status: DC
  Administered 2015-12-06 – 2015-12-18 (×13): 40 mg via ORAL
  Filled 2015-12-06 (×13): qty 1

## 2015-12-06 MED ORDER — CARVEDILOL 6.25 MG PO TABS
6.2500 mg | ORAL_TABLET | Freq: Two times a day (BID) | ORAL | Status: DC
  Administered 2015-12-07 – 2015-12-18 (×23): 6.25 mg via ORAL
  Filled 2015-12-06 (×23): qty 1

## 2015-12-06 NOTE — Progress Notes (Addendum)
[facility] PROGRESS NOTE  Albert Stanley [id number] DOB: 08/14/1946 DOA: 12/03/2015  PCP: [name], MD  Brief HPI: 70 year old African-American male with a past medical history of chronic kidney disease, bladder cancer, history of diabetes, history of DVT who was on warfarin presented with right flank pain and was found to have right renal subcapsular hematoma. Patient was hospitalized for further management. He was also noted to have worsening renal failure. Patient was seen by nephrology. Echocardiogram revealed new systolic CHF. Cardiology was consulted.  Past medical history:  Past Medical History  Diagnosis Date  . Hypertension   . Diabetes mellitus without complication ([place])   . Asthma   . Hyperlipidemia   . DVT (deep venous thrombosis) ([place])   . Renal insufficiency   . Bladder cancer ([place]) dx'd 1990    surg only    Consultants: Nephrology. Phone discussion with urology. Cardiology  Procedures:  Venous Doppler of the lower extremities No DVT in either lower extremity  ECHO Study Conclusions - Left ventricle: The cavity size was mildly dilated. There wasmoderate concentric hypertrophy. Systolic function was moderatelyreduced. The estimated ejection fraction was in the range of 35%to 40%. Hypokinesis of the inferolateral and inferoseptalmyocardium. - Aortic valve: There was trivial regurgitation. - Mitral valve: There was moderate regurgitation. - Left atrium: The atrium was severely dilated. - Pericardium, extracardiac: A small pericardial effusion wasidentified.  Antibiotics: None  Subjective: Patient feels better this morning. He has been passing a lot of urine. Still feels fatigued at times. Shortness of breath comes and goes. Denies any chest pain. No nausea, vomiting. Abdominal pain has improved.  Objective: Vital Signs  Filed Vitals:   12/05/15 0532 12/05/15 1947 12/05/15 2200 12/06/15 0418  BP: 159/80  142/83 148/89  Pulse: 101   53 102  Temp: 98.7 F (37.1 C) 99.5 F (37.5 C) 99.4 F (37.4 C) 98.3 F (36.8 C)  TempSrc: Oral Oral Oral Oral  Resp: 18  20 18   Height:      Weight:      SpO2: 94%  95% 95%    Intake/Output Summary (Last 24 hours) at 12/06/15 0735 Last data filed at 12/06/15 [id number]  Gross per 24 hour  Intake    428 ml  Output   3425 ml  Net  -2997 ml   Filed Weights   12/04/15 0000 12/04/15 1825  Weight: 111.131 kg (245 lb) 117.073 kg (258 lb 1.6 oz)    General appearance: alert, cooperative, appears stated age and no distress Resp: Improved air entry bilaterally. Few crackles. No wheezing or rhonchi.  Cardio: regular rate and rhythm, S1, S2 normal, no murmur, click, rub or gallop. GI: soft, non-tender; bowel sounds normal; no masses,  no organomegaly Extremities: Edema is noted. Bilateral lower extremities 1-2+ Neurologic: Awake and alert. Oriented 3. No focal neurological deficits.  Lab Results:  Basic Metabolic Panel:  Recent Labs Lab 12/04/15 0022 12/05/15 0616 12/06/15 0458  NA 140 143 138  K 4.4 4.4 4.0  CL 105 109 103  CO2 23 21* 22  GLUCOSE 180* 77 113*  BUN 67* 65* 73*  CREATININE 5.48* 6.04* 6.59*  CALCIUM 8.8* 9.3 9.0   Liver Function Tests:  Recent Labs Lab 12/04/15 0022 12/05/15 0616  AST 28 26  ALT 18 16*  ALKPHOS 48 41  BILITOT 0.5 0.8  PROT 6.6 6.5  ALBUMIN 3.6 3.4*    Recent Labs Lab 12/04/15 0022  LIPASE 23   CBC:  Recent Labs Lab 12/04/15 1834 12/05/15  0021 12/05/15 0616 12/05/15 1210 12/06/15 0458  WBC 8.1 8.6 8.8 9.0 9.5  HGB 7.7* 7.5* 7.5* 7.3* 7.1*  HCT 23.3* 22.3* 23.0* 22.3* 21.6*  MCV 87.9 88.1 87.8 87.1 86.7  PLT 159 170 167 164 165   CBG:  Recent Labs Lab 12/04/15 2131 12/05/15 0725 12/05/15 1153 12/05/15 1656 12/05/15 1955  GLUCAP 84 74 93 111* 92    Recent Results (from the past 240 hour(s))  Urine culture     Status: None   Collection Time: 12/04/15  7:07 AM  Result Value Ref Range Status   Specimen  Description URINE, CLEAN CATCH  Final   Special Requests NONE  Final   Culture   Final    NO GROWTH 1 DAY Performed at [facility]    Report Status 12/05/2015 FINAL  Final      Studies/Results: Dg Chest Port 1 View  12/04/2015  CLINICAL DATA:  Volume overload EXAM: PORTABLE CHEST 1 VIEW COMPARISON:  02/25/2015 FINDINGS: Cardiac shadow is enlarged. Pulmonary vascular congestion with mild pulmonary edema is noted. No sizable effusion is seen. No bony abnormality is noted. IMPRESSION: Changes consistent with CHF consistent with the given clinical history. Electronically Signed   By: Inez Catalina M.D.   On: 12/04/2015 16:37   Ct Renal Stone Study  12/04/2015  CLINICAL DATA:  Right lower quadrant and right flank pain with constipation and hematuria since last night. Anemia with bilateral leg swelling for a while. EXAM: CT ABDOMEN AND PELVIS WITHOUT CONTRAST TECHNIQUE: Multidetector CT imaging of the abdomen and pelvis was performed following the standard protocol without IV contrast. COMPARISON:  Lumbar MRI 11/22/2014.  Abdominal pelvic CT 12/18/2010. FINDINGS: Lower chest: Small right-greater-than-left pleural effusions have enlarged. There is mild atelectasis at both lung bases. Cardiomegaly and a small pericardial effusion are unchanged. Hepatobiliary: As evaluated in the noncontrast state, the liver appears unremarkable per No evidence of gallstones, gallbladder wall thickening or biliary dilatation. Pancreas: Unremarkable. No pancreatic ductal dilatation or surrounding inflammatory changes. Spleen: Normal in size without focal abnormality. Adrenals/Urinary Tract: The adrenal glands appear stable without suspicious findings. There has been interval development of a large subcapsular hematoma involving the right kidney. This measures up to 2.9 cm in thickness and demonstrates high density. There is resulting compression of the right renal parenchyma. There is a complex lesion in the upper pole  of the right kidney measuring up to 5.2 cm in diameter on image 29, enlarged from the prior study. This is complex with areas of increased density. There are other smaller hyperdense lesions in the right kidney, including a 2.1 cm lesion in the upper pole on image 23 and a 2.0 cm lesion anteriorly in the mid right kidney on image 30. There is also a simple cyst in the upper pole the right kidney. There are enlarging low-density left renal lesions, measuring up to 6.5 cm in the upper pole and 7.5 cm in the lower pole. In addition, there are several small hyperdense left renal lesions as well. There is no evidence of urinary tract calculus or hydronephrosis. There is asymmetric perinephric soft tissue stranding on the right. No significant bladder abnormalities are seen; bladder assessment is limited by artifact from the patient's left hip arthroplasty. Stomach/Bowel: No evidence of bowel wall thickening, distention or surrounding inflammatory change. The are diverticular changes of the sigmoid colon. Vascular/Lymphatic: There are no enlarged abdominal or pelvic lymph nodes. Aortoiliac atherosclerosis noted. Reproductive: Mild enlargement of the prostate gland, grossly stable. Other: No  ascites or peritoneal nodularity. There is a small umbilical hernia containing only fat. Musculoskeletal: No acute or significant osseous findings. There are extensive degenerative changes throughout the spine. Patient is status post left total hip arthroplasty. Chronic fragmentation of the left greater trochanter is unchanged. IMPRESSION: 1. Interval development of large right renal subcapsular hematoma with compression of the renal parenchyma. This hematoma is probably related to hemorrhage into one of several hyperdense renal cysts which have enlarged from the prior study. 2. Enlarging simple and complex renal lesions bilaterally, likely cysts based on the previous study. There is 1 in the upper pole the right kidney which is  complex with heterogeneous density, measuring 5.2 cm in diameter. After the patient's subcapsular hematoma has resolved, follow up to include pre and post-contrast imaging with CT or MRI recommended to exclude solid lesion. 3. No evidence of urinary tract calculus or hydronephrosis. 4. Additional incidental findings as above, including cardiomegaly, pleural and pericardial effusions, atherosclerosis and prostatomegaly. 5. These results were called by telephone at the time of interpretation on 12/04/2015 at 8:48 am to Dr. Leo Grosser , who verbally acknowledged these results. Electronically Signed   By: Richardean Sale M.D.   On: 12/04/2015 08:48    Medications:  Scheduled: . amLODipine  10 mg Oral Daily  . calcitRIOL  0.5 mcg Oral Daily  . docusate sodium  100 mg Oral BID  . doxazosin  4 mg Oral Daily  . furosemide  160 mg Intravenous Q6H  . insulin aspart  0-9 Units Subcutaneous TID WC  . metolazone  5 mg Oral BID  . sodium chloride flush  3 mL Intravenous Q12H   Continuous: . sodium chloride 10 mL/hr at 12/05/15 [id number]   [id number] **OR** acetaminophen, albuterol, alum & mag hydroxide-simeth, cyclobenzaprine, famotidine, hydrALAZINE, HYDROmorphone (DILAUDID) injection, ondansetron **OR** ondansetron (ZOFRAN) IV, oxyCODONE  Assessment/Plan:  Principal Problem:   Renal hematoma, right Active Problems:   CORONARY ATHEROSCLEROSIS NATIVE CORONARY ARTERY   CKD (chronic kidney disease), stage IV ([name])   ARF (acute renal failure) ([name])   Normocytic anemia   Anticoagulant long-term use   History of DVT (deep vein thrombosis)   Renal hematoma    Right renal subcapsular hematoma Patient's symptoms have improved. Hemoglobin remains low but stable. Disscussed at the time of admission with Dr. Matilde Sprang with urology. No indication for surgical intervention. If hemoglobin drops significantly, he may need repeat imaging studies and possibly consult IR for embolization. Anticoagulation has  been held. INR was reversed with vitamin K and Kcentra.  Normocytic anemia secondary to chronic kidney disease as well as acute blood loss Hemoglobin is noted to be low but stable. He may benefit from blood transfusion. Discussed with the Dr. Jonnie Finner with nephrology. He would like to wait for one to 2 more days till we are able to reduce his volume before transfusing. I think this is reasonable. Continue to monitor. Transfuse if it drops significantly below 7.  Acute on chronic kidney disease, possibly stage III Patient's creatinine continues to rise. He is making adequate amount of urine currently. Nephrology is managing and we appreciate their input. Patient is on IV Lasix. Ins and outs.   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 This is new compared to before. He has been seen by cardiology in the past for preoperative clearance. He underwent a stress test in 2011, which was low risk. Considering this new finding. We will consult cardiology for input. Patient has had a run of asymptomatic  nonsustained VT. His potassium is normal. We will initiate low-dose beta blocker.  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 INR was 2.15. He was given vitamin K and Kcentra. Holding his Coumadin for now due to active bleeding. Lower extremity venous Dopplers does not show any new acute DVT. 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 This is somewhat reassuring. There is no immediate need for any kind of intervention at this time. Will continue to hold his warfarin for now while we're managing his multiple acute issues. Will need to discuss with urology as to if and when warfarin can be reinitiated.  History of diabetes mellitus type 2 Sliding scale insulin coverage will be continued. HbA1c is 5.8, implying perhaps too aggressive control. He is on glipizide and Januvia at home, both  of which have been held. CBGs were low yesterday but better today.   Right kidney complex lesion This will need to be further characterized with a repeat CT or MRI after acute episode has subsided, preferably after a few months. He will need to follow-up with his urologist in the next few weeks.  History of mild coronary artery disease based on cardiac catheterization in 2002 He had a low-risk stress test for preoperative evaluation in 2011. Stable. Continue home medications. See above.  History of essential Hypertension Monitor blood pressures closely. Hydralazine as needed. Continue amlodipine.    DVT Prophylaxis: TED is the only option at this time  Code Status: Full code  Family Communication: Discussed with the patient  Disposition Plan: Continue management as outlined above. Cardiology has been consulted.    LOS: 2 days   [facility] Pager [phone] 12/06/2015, 7:35 AM  If 7PM-7AM, please contact night-coverage at [email or web address], password [email or web address]

## 2015-12-06 NOTE — Consult Note (Signed)
CARDIOLOGY CONSULT NOTE   Patient ID: Albert Stanley MRN: [id number] DOB/AGE: [birth year] 70 y.o.  Admit date: 12/03/2015  Primary Physician   [name], MD Primary Cardiologist   Dr [name] (2011) Reason for Consultation   LVD  [id number] A [name] is a 70 y.o. year old male with a history of CKD, bladder cancer, DM, DVT who was on warfarin.  Pt had MV 2011 pre-op for L-THR surgery. ER 47%, Fixed inferoapical defect, no ischemia. "May be similar to 2002 study when cath showed minimal disease. OK for surgery. EF mildly decreased. Would confirm with echo".Echo w/ EF 60-65%, no WMA.  Admtted 12/04/2015 with right flank pain and was found to have right renal subcapsular hematoma, INR 2.15. He was also noted to have worsening renal failure, seen by nephrology. Echocardiogram revealed new systolic CHF. Cardiology was consulted.  [name] noticed LE edema starting 3-4 months after taking a course of prednisone for MS pain. At that time, the edema was daytime only. The edema progressed to the point that he had it all the time. He is not sure how much weight he has gained. Pt believes he was around 250 lbs in December. He did not notice any SOB. His activity level has not been very good, MS pain from a bad knee has limited him.   He came to the hospital because of the edema, then felt more and more SOB. Found out his kidney function was worse and he had the hematoma as well.    He has never had chest pain, denies PND, sleeps on 2 pillows chronically.  Breathing worsened after admission, but is better today. Still w/ sig LE edema.    Past Medical History  Diagnosis Date  . Hypertension   . Diabetes mellitus without complication ([place])   . Asthma   . Hyperlipidemia   . DVT (deep venous thrombosis) ([place])   . Renal insufficiency   . Bladder cancer ([place]) dx'd 1990    surg only     Past Surgical History  Procedure Laterality Date  . Back surgery      2 times  . Joint  replacement      hip  . Arthroscopic knee surgery    . Cystoscopy      Allergies  Allergen Reactions  . Meperidine Hcl     Unknown  . Penicillins     "Knocks me out."   Has patient had a PCN reaction causing immediate rash, facial/tongue/throat swelling, SOB or lightheadedness with hypotension: yes- blacked out Has patient had a PCN reaction causing severe rash involving mucus membranes or skin necrosis: unknown Has patient had a PCN reaction that required hospitalization: at MD office Has patient had a PCN reaction occurring within the last 10 years: no If all of the above answers are "NO", then may proceed with Cephalosporin use.   . Sulfonamide Derivatives     Unknown.   . Povidone-Iodine Rash    I have reviewed the patient's current medications . amLODipine  10 mg Oral Daily  . calcitRIOL  0.5 mcg Oral Daily  . carvedilol  3.125 mg Oral BID WC  . docusate sodium  100 mg Oral BID  . furosemide  160 mg Intravenous Q6H  . insulin aspart  0-9 Units Subcutaneous TID WC  . metolazone  5 mg Oral BID  . sodium chloride flush  3 mL Intravenous Q12H   . sodium chloride 10 mL/hr at 12/05/15 [id number]   acetaminophen **OR**  acetaminophen, albuterol, alum & mag hydroxide-simeth, cyclobenzaprine, famotidine, hydrALAZINE, HYDROmorphone (DILAUDID) injection, ondansetron **OR** ondansetron (ZOFRAN) IV, oxyCODONE  Medication Sig  allopurinol (ZYLOPRIM) 300 MG tablet Take 150 mg by mouth daily.    amLODipine (NORVASC) 10 MG tablet Take 10 mg by mouth daily.  calcitRIOL (ROCALTROL) 0.5 MCG capsule Take 0.5 mcg by mouth daily.   cyclobenzaprine (FLEXERIL) 5 MG tablet Take 5 mg by mouth 3 (three) times daily as needed for muscle spasms.  doxazosin (CARDURA) 4 MG tablet Take 4 mg by mouth daily.  ferrous sulfate 325 (65 FE) MG tablet Take 325 mg by mouth daily with breakfast.    fluticasone (FLONASE) 50 MCG/ACT nasal spray Place 1 spray into both nostrils daily as needed for allergies or  rhinitis.  furosemide (LASIX) 80 MG tablet Take 80 mg by mouth daily.  glipiZIDE (GLUCOTROL XL) 5 MG 24 hr tablet Take 5 mg by mouth daily.    indapamide (LOZOL) 1.25 MG tablet Take 1.25 mg by mouth every morning.    JANUVIA 50 MG tablet Take 25 mg by mouth daily.   ondansetron (ZOFRAN ODT) 8 MG disintegrating tablet Take 1 tablet (8 mg total) by mouth every 8 (eight) hours as needed for nausea or vomiting.  oxyCODONE-acetaminophen (PERCOCET) 7.5-325 MG tablet Take 1 tablet by mouth every 8 (eight) hours as needed. For pain  oxyCODONE-acetaminophen (PERCOCET/ROXICET) 5-325 MG per tablet Take 1 tablet by mouth every 6 (six) hours as needed for severe pain.  oxymetazoline (AFRIN) 0.05 % nasal spray Place 1 spray into both nostrils 2 (two) times daily as needed for congestion.  rosuvastatin (CRESTOR) 5 MG tablet Take 5 mg by mouth daily.  warfarin (COUMADIN) 5 MG tablet Take 2.5-5 mg by mouth daily at 6 PM. Taking 5 on Monday, Tuesday, Wednesday, Thursday and Friday and take 2.5mg  on Saturday and Sunday  cloNIDine (CATAPRES) 0.1 MG tablet Take 1 tablet (0.1 mg total) by mouth every 8 (eight) hours as needed (withdrawl symptoms). Patient not taking: Reported on 12/04/2015  zolpidem (AMBIEN) 5 MG tablet Take 1 tablet (5 mg total) by mouth at bedtime as needed for sleep. Patient not taking: Reported on 12/04/2015     Social History   Social History  . Marital Status: Married    Spouse Name: N/A  . Number of Children: N/A  . Years of Education: N/A   Occupational History  . Retired    Social History Main Topics  . Smoking status: Former Research scientist (life sciences)  . Smokeless tobacco: Never Used  . Alcohol Use: No     Comment: Used to drink on the weekends, quit 2 years ago  . Drug Use: No  . Sexual Activity: No   Other Topics Concern  . Not on file   Social History Narrative   Lives with wife and daughter.    Family Status  Relation Status Death Age  . Mother Deceased   . Father Deceased    Family  History  Problem Relation Age of Onset  . Arthritis Mother   . Cancer Father   . Heart disease Father   . Hypertension Sister   . Alcohol abuse Brother   . Diabetes Daughter      ROS:  Full 14 point review of systems complete and found to be negative unless listed above.  Physical Exam: Blood pressure 122/63, pulse 99, temperature 98.3 F (36.8 C), temperature source Oral, resp. rate 18, height 5\' 11"  (1.803 m), weight 258 lb 1.6 oz (117.073 kg), SpO2 95 %.  General: Well developed, well nourished, male in no acute distress Head: Eyes PERRLA, No xanthomas.   Normocephalic and atraumatic, oropharynx without edema or exudate. Dentition: poor Lungs: rales bases, good air exchange Heart: HRRR S1 S2, no rub/gallop, No murmur. pulses are 2+ both upper extrem, decreased in both LE due to edema  Neck: No carotid bruits. No lymphadenopathy.  JVD elevated, but difficult to assess 2nd body habitus. Abdomen: Bowel sounds present, abdomen soft and non-tender without masses or hernias noted. Msk:  No spine or cva tenderness. No weakness, no joint deformities or effusions. Extremities: No clubbing or cyanosis. 2+ LE edema.  Neuro: Alert and oriented X 3. No focal deficits noted. Psych:  Good affect, responds appropriately Skin: No rashes or lesions noted.  Labs:   Lab Results  Component Value Date   WBC 9.5 12/06/2015   HGB 7.1* 12/06/2015   HCT 21.6* 12/06/2015   MCV 86.7 12/06/2015   PLT 165 12/06/2015    Recent Labs  12/06/15 0458  INR 1.53*     Recent Labs Lab 12/05/15 0616 12/06/15 0458  NA 143 138  K 4.4 4.0  CL 109 103  CO2 21* 22  BUN 65* 73*  CREATININE 6.04* 6.59*  CALCIUM 9.3 9.0  PROT 6.5  --   BILITOT 0.8  --   ALKPHOS 41  --   ALT 16*  --   AST 26  --   GLUCOSE 77 113*  ALBUMIN 3.4*  --    LIPASE  Date/Time Value Ref Range Status  12/04/2015 12:22 AM 23 11 - 51 U/L Final   Echo: 12/05/2015 - Left ventricle: The cavity size was mildly dilated. There  was moderate concentric hypertrophy. Systolic function was moderately reduced. The estimated ejection fraction was in the range of 35% to 40%. Hypokinesis of the inferolateral and inferoseptal myocardium. - Aortic valve: There was trivial regurgitation. - Mitral valve: There was moderate regurgitation. - Left atrium: The atrium was severely dilated. - Pericardium, extracardiac: A small pericardial effusion was identified.  ECHO: 08/04/2010 - Left ventricle: The cavity size was normal. Wall thickness was  normal. Systolic function was normal. The estimated ejection  fraction was in the range of 60% to 65%. Wall motion was normal;  there were no regional wall motion abnormalities. - Left atrium: The atrium was mildly dilated. - Pulmonary arteries: PA peak pressure: 5mm Hg (S).  ECG: 12/06/2015 SR, HR 94 No sig change from 02/2015  Radiology:  Dg Chest Port 1 View 12/04/2015  CLINICAL DATA:  Volume overload EXAM: PORTABLE CHEST 1 VIEW COMPARISON:  02/25/2015 FINDINGS: Cardiac shadow is enlarged. Pulmonary vascular congestion with mild pulmonary edema is noted. No sizable effusion is seen. No bony abnormality is noted. IMPRESSION: Changes consistent with CHF consistent with the given clinical history. Electronically Signed   By: Inez Catalina M.D.   On: 12/04/2015 16:37    ASSESSMENT AND PLAN:   The patient was seen today by Dr Angelena Form, the patient evaluated and the data reviewed.  Principal Problem: 1.  Renal hematoma, right - per IM/Renal - H&H 8.0/24.7 on admission, was 9.5/25.2 in 02/2015  Active Problems: 2.  CORONARY ATHEROSCLEROSIS NATIVE CORONARY ARTERY - cath 2002, LAD 25%, no other dz, EF nl.  3.  CKD (chronic kidney disease), stage IV ([name])   ARF (acute renal failure) ([name]) - baseline Cr 3-4 - Renal MD is Dr Florene Glen - Dr Melvia Heaps following here, says no indication for HD at this time  4.  Normocytic anemia -  may be a component of ABLA on anemia of  chronic disease  5. Anticoagulant long-term use   History of DVT (deep vein thrombosis) - INR 2.15 on admission  6.  Renal hematoma - per IM  7. Acute systolic CHF (congestive heart failure) ([place]) - unfortunate combination of LVD and renal failure. - may be cardiorenal syndrome - no ischemic events by history - BP may not be well-controlled, pt does not check it at home - remote hx of ETOH use on the weekends - none in 2 years - unclear if NICM or ICM - no benefit from MV now, no cath w/ poor renal function - ck lipids in am, add ASA and statin, increase BB as tolerated - once pt recovered from this event, f/u in office to discuss further eval. - once pt on HD, consider cath.   SignedLenoard Aden 12/06/2015 4:39 PM Beeper [phone]  I have personally seen and examined this patient with Rosaria Ferries, PA-C. I agree with the assessment and plan as outlined above. He has no known CAD. He is admitted with renal failure, volume overload, anemia. His creatinine is now up to 6.5. His Hgb is 7.0. He is being followed closely by Nephrology and is being aggressively diuresed with 160 mg IV Lasix Q6 hours and metolazone 5 mg po BID. His LVEF is  Now 35-40%. He may have a combination of volume overload from his systolic dysfunction and renal failure. At this time, he is not a candidate for an invasive cardiac evaluation. Cardiac cath can be considered once he is committed to dialysis. We will follow along.   MCALHANY,CHRISTOPHER 12/06/2015 5:41 PM

## 2015-12-06 NOTE — Progress Notes (Signed)
  [facility] Progress Note   Subjective: 3.4 L UOP yestserday. ECHO showed drop in EF 30-35% and focal WMA. Feels better, breathing better.   Filed Vitals:   12/05/15 0532 12/05/15 1947 12/05/15 2200 12/06/15 0418  BP: 159/80  142/83 148/89  Pulse: 101  53 102  Temp: 98.7 F (37.1 C) 99.5 F (37.5 C) 99.4 F (37.4 C) 98.3 F (36.8 C)  TempSrc: Oral Oral Oral Oral  Resp: 18  20 18   Height:      Weight:      SpO2: 94%  95% 95%    Inpatient medications: . amLODipine  10 mg Oral Daily  . calcitRIOL  0.5 mcg Oral Daily  . docusate sodium  100 mg Oral BID  . doxazosin  4 mg Oral Daily  . furosemide  160 mg Intravenous Q6H  . insulin aspart  0-9 Units Subcutaneous TID WC  . metolazone  5 mg Oral BID  . sodium chloride flush  3 mL Intravenous Q12H   . sodium chloride 10 mL/hr at 12/05/15 [id number]   acetaminophen **OR** acetaminophen, albuterol, alum & mag hydroxide-simeth, cyclobenzaprine, famotidine, hydrALAZINE, HYDROmorphone (DILAUDID) injection, ondansetron **OR** ondansetron (ZOFRAN) IV, oxyCODONE  Exam: Alert obese AAM no distress, calm JVP 12-14 cm Chest rales bilat bases RRR no MRG aBd obese , soft ntnd no mass or ascites Ext 2+ pitting pretib edema bilat LE's No UE edema, no sacral edema Neuro no asterixis, Ox 3, calm  UA - 0-5 rbc/ wbc/ epi, prot 100 , 1.012 Na 140 K 4.4 CO2 23 BUN 67 Creat 5.48 Alb 3.6 LFT's ok Hb 8.0 WBC 7k CXR 2/22 > bilat CHF, significant   Home meds > allopurinol, norvasc, Rocaltrol, Flexeril, Cardura, iron pills, flonase, Lasix 80/d, glipizide, indapamide, januvia, percocet prn/ oxycodone prn, rosuvastatin, warfarin, clonidine, zolpidem   Assessment: 1 CKD stage 4/5 - baseline creat 3-4.  Creat 5 on admission and rising w diuresis.  Has poor cardiac function by echo which prob explains some of the renal decompensation. (i.e.cardiorenal).  Not uremic and no indication for dialysis yet.  2 Pulm edema/ vol overload -  improved UOP on max diuretics, improved dyspnea 3 R flank pain/ subcapsular hematoma - rec'd vit K and Kcentra , per primary  4 HTN cont norvasc/ cardura 5 Anemia of CKD- Hb 7.1, check Fe, avoid transfusion until more stable 6 Hx bladder cancer 7 Renal cysts, some complex lesions - needs MRI when more stable   Plan - cont max diuretics.  Get cardiology opinion re: reduced EF. CXR f./u in am   Kelly Splinter MD [place] pager [phone]    cell [phone] 12/06/2015, 9:10 AM    Recent Labs Lab 12/04/15 0022 12/05/15 0616 12/06/15 0458  NA 140 143 138  K 4.4 4.4 4.0  CL 105 109 103  CO2 23 21* 22  GLUCOSE 180* 77 113*  BUN 67* 65* 73*  CREATININE 5.48* 6.04* 6.59*  CALCIUM 8.8* 9.3 9.0    Recent Labs Lab 12/04/15 0022 12/05/15 0616  AST 28 26  ALT 18 16*  ALKPHOS 48 41  BILITOT 0.5 0.8  PROT 6.6 6.5  ALBUMIN 3.6 3.4*    Recent Labs Lab 12/05/15 0616 12/05/15 1210 12/06/15 0458  WBC 8.8 9.0 9.5  HGB 7.5* 7.3* 7.1*  HCT 23.0* 22.3* 21.6*  MCV 87.8 87.1 86.7  PLT 167 164 165

## 2015-12-06 NOTE — Progress Notes (Signed)
OT Cancellation Note  Patient Details Name: DADRIAN DIETZ MRN: [id number] DOB: 01-16-1946   Cancelled Treatment:    Reason Eval/Treat Not Completed: OT screened, no needs identified, will sign off  Darlina Rumpf Datto, OTR/L [id number]  12/06/2015, 4:31 PM

## 2015-12-06 NOTE — Progress Notes (Signed)
Informed by CMT, Christy, patient had nine beats of v-tach.  Patient asymptomatic.  Dr. [place] Pink made aware via text paging system.  Will continue to monitor patient.

## 2015-12-06 NOTE — Progress Notes (Signed)
Physical Therapy Treatment and Discharge from acute PT Patient Details Name: Albert Stanley MRN: [id number] DOB: Apr 13, 1946 Today's Date: 12-20-2015    History of Present Illness 70 yo male admitted with right renal hematoma. hx of htn, dm, athma, dvt, bladder cancer, renal insufficiency, ckd.     PT Comments    Pt ambulated in hallway, reports chronic knee pain causes gait abnormality.  Pt modified independent in room and supervision level in hallway for safety.  Pt has met acute PT goals and will be discharged from PT.  Recommend nursing ambulate with pt during remainder of acute stay.   Follow Up Recommendations  Supervision for mobility/OOB     Equipment Recommendations  None recommended by PT    Recommendations for Other Services       Precautions / Restrictions Precautions Precautions: Fall    Mobility  Bed Mobility Overal bed mobility: Modified Independent                Transfers Overall transfer level: Modified independent                  Ambulation/Gait Ambulation/Gait assistance: Supervision Ambulation Distance (Feet): 200 Feet Assistive device:  (pt used hand rail in hallway) Gait Pattern/deviations: Step-through pattern;Decreased stride length     General Gait Details: slight limp observed however pt reports hx of R hip THA and has R knee pain (states possibly needs replacement too), no steadying assist required   Stairs            Wheelchair Mobility    Modified Rankin (Stroke Patients Only)       Balance                                    Cognition Arousal/Alertness: Awake/alert Behavior During Therapy: WFL for tasks assessed/performed Overall Cognitive Status: Within Functional Limits for tasks assessed                      Exercises      General Comments        Pertinent Vitals/Pain Pain Assessment: 0-10 Pain Score: 6  Pain Location: knees Pain Descriptors / Indicators: Sore Pain  Intervention(s): Limited activity within patient's tolerance;Monitored during session;Repositioned    Home Living                      Prior Function            PT Goals (current goals can now be found in the care plan section) Progress towards PT goals: Goals met/education completed, patient discharged from PT    Frequency  Min 3X/week    PT Plan Other (comment) (d/c from acute PT)    Co-evaluation             End of Session   Activity Tolerance: Patient tolerated treatment well Patient left: in bed;with call bell/phone within reach;with family/visitor present     Time: 2458-0998 PT Time Calculation (min) (ACUTE ONLY): 13 min  Charges:  $Gait Training: 8-22 mins                    G Codes:      Avanell Banwart,KATHrine E 12-20-2015, 3:52 PM Carmelia Bake, PT, DPT Dec 20, 2015 Pager: [phone]

## 2015-12-07 ENCOUNTER — Inpatient Hospital Stay (HOSPITAL_COMMUNITY): Payer: Medicare Other

## 2015-12-07 LAB — CBC
HCT: 20.9 % — ABNORMAL LOW (ref 39.0–52.0)
HEMOGLOBIN: 7 g/dL — AB (ref 13.0–17.0)
MCH: 28.8 pg (ref 26.0–34.0)
MCHC: 33.5 g/dL (ref 30.0–36.0)
MCV: 86 fL (ref 78.0–100.0)
PLATELETS: 196 10*3/uL (ref 150–400)
RBC: 2.43 MIL/uL — AB (ref 4.22–5.81)
RDW: 15.1 % (ref 11.5–15.5)
WBC: 9.1 10*3/uL (ref 4.0–10.5)

## 2015-12-07 LAB — GLUCOSE, CAPILLARY
GLUCOSE-CAPILLARY: 125 mg/dL — AB (ref 65–99)
GLUCOSE-CAPILLARY: 115 mg/dL — AB (ref 65–99)
GLUCOSE-CAPILLARY: 179 mg/dL — AB (ref 65–99)
Glucose-Capillary: 92 mg/dL (ref 65–99)

## 2015-12-07 LAB — BASIC METABOLIC PANEL
ANION GAP: 13 (ref 5–15)
BUN: 84 mg/dL — AB (ref 6–20)
CHLORIDE: 97 mmol/L — AB (ref 101–111)
CO2: 23 mmol/L (ref 22–32)
Calcium: 8.7 mg/dL — ABNORMAL LOW (ref 8.9–10.3)
Creatinine, Ser: 7.16 mg/dL — ABNORMAL HIGH (ref 0.61–1.24)
GFR calc Af Amer: 8 mL/min — ABNORMAL LOW (ref >60)
GFR, EST NON AFRICAN AMERICAN: 7 mL/min — AB (ref >60)
Glucose, Bld: 143 mg/dL — ABNORMAL HIGH (ref 65–99)
POTASSIUM: 3.9 mmol/L (ref 3.5–5.1)
SODIUM: 133 mmol/L — AB (ref 135–145)

## 2015-12-07 LAB — RETICULOCYTES
RBC.: 2.58 MIL/uL — AB (ref 4.22–5.81)
RETIC COUNT ABSOLUTE: 51.6 10*3/uL (ref 19.0–186.0)
RETIC CT PCT: 2 % (ref 0.4–3.1)

## 2015-12-07 LAB — PROTIME-INR
INR: 1.6 — ABNORMAL HIGH (ref 0.00–1.49)
PROTHROMBIN TIME: 18.5 s — AB (ref 11.6–15.2)

## 2015-12-07 MED ORDER — FUROSEMIDE 40 MG PO TABS
160.0000 mg | ORAL_TABLET | Freq: Three times a day (TID) | ORAL | Status: DC
  Administered 2015-12-08: 160 mg via ORAL
  Filled 2015-12-07: qty 4

## 2015-12-07 MED ORDER — BISACODYL 5 MG PO TBEC
10.0000 mg | DELAYED_RELEASE_TABLET | Freq: Every day | ORAL | Status: DC | PRN
  Administered 2015-12-08 – 2015-12-18 (×8): 10 mg via ORAL
  Filled 2015-12-07 (×2): qty 2
  Filled 2015-12-07: qty 4
  Filled 2015-12-07: qty 2

## 2015-12-07 MED ORDER — METOLAZONE 10 MG PO TABS
10.0000 mg | ORAL_TABLET | Freq: Every day | ORAL | Status: DC
  Administered 2015-12-08 – 2015-12-13 (×6): 10 mg via ORAL
  Filled 2015-12-07 (×10): qty 1

## 2015-12-07 MED ORDER — POLYETHYLENE GLYCOL 3350 17 G PO PACK
17.0000 g | PACK | Freq: Once | ORAL | Status: AC
  Administered 2015-12-07: 17 g via ORAL

## 2015-12-07 NOTE — Progress Notes (Addendum)
[facility] Progress Note   Subjective: CXR shows improved edema.  Ambulating w/o dyspnea, on room air.  Creat up slightly  Filed Vitals:   12/06/15 2217 12/07/15 0646 12/07/15 1300 12/07/15 1742  BP: 139/98 138/73 132/81   Pulse: 95 92 90   Temp: 99 F (37.2 C) 98.7 F (37.1 C) 99.5 F (37.5 C)   TempSrc: Oral Oral Oral   Resp: 18 18    Height:      Weight:    116.892 kg (257 lb 11.2 oz)  SpO2: 95% 94% 95%     Inpatient medications: . amLODipine  10 mg Oral Daily  . atorvastatin  40 mg Oral q1800  . calcitRIOL  0.5 mcg Oral Daily  . carvedilol  6.25 mg Oral BID WC  . docusate sodium  100 mg Oral BID  . furosemide  160 mg Intravenous Q6H  . insulin aspart  0-9 Units Subcutaneous TID WC  . metolazone  5 mg Oral BID  . sodium chloride flush  3 mL Intravenous Q12H   . sodium chloride 10 mL/hr at 12/05/15 [id number]   acetaminophen **OR** acetaminophen, albuterol, alum & mag hydroxide-simeth, bisacodyl, cyclobenzaprine, famotidine, hydrALAZINE, HYDROmorphone (DILAUDID) injection, ondansetron **OR** ondansetron (ZOFRAN) IV, oxyCODONE  Exam: Alert obese AAM no distress, calm JVP 12-14 cm Chest rales bilat bases RRR no MRG aBd obese , soft ntnd no mass or ascites Ext 2+ pitting pretib edema bilat LE's No UE edema, no sacral edema Neuro no asterixis, Ox 3, calm  UA - 0-5 rbc/ wbc/ epi, prot 100 , 1.012 Na 140 K 4.4 CO2 23 BUN 67 Creat 5.48 Alb 3.6 LFT's ok Hb 8.0 WBC 7k CXR 2/22 > bilat CHF, significant   Home meds > allopurinol, norvasc, Rocaltrol, Flexeril, Cardura, iron pills, flonase, Lasix 80/d, glipizide, indapamide, januvia, percocet prn/ oxycodone prn, rosuvastatin, warfarin, clonidine, zolpidem   Assessment: 1 CKD stage 4/5 - baseline creat 3-4.  Creat 5 on admission and rising w diuresis.  Change lasix to po 160 mg tid then bid at dc. Mobilize, get OOB, check creatinine in am.  Would prefer he have AVF placed while here but don't know if he  will agree to that.   2 Pulm edema / LE edema - better, still has extra volume 3 R flank pain/ subcapsular hematoma - rec'd vit K and Kcentra , per primary  4 HTN on norvasc/ coreg, bp's good 5 Anemia of CKD- Hb 7.1, check anemia panel and stool guiac. Give iron if needed, +/- esa.   6 Hx bladder cancer 7 Renal cysts, some complex lesions - needs MRI when more stable 8 Systolic CHF - no cath for now per cardiology; reassess when on dialysis. 9 Constipated - dulcolax ordered   Plan - as above   Kelly Splinter MD [facility] pager [phone]    cell [phone] 12/07/2015, 9:23 PM    Recent Labs Lab 12/05/15 0616 12/06/15 0458 12/07/15 0507  NA 143 138 133*  K 4.4 4.0 3.9  CL 109 103 97*  CO2 21* 22 23  GLUCOSE 77 113* 143*  BUN 65* 73* 84*  CREATININE 6.04* 6.59* 7.16*  CALCIUM 9.3 9.0 8.7*    Recent Labs Lab 12/04/15 0022 12/05/15 0616  AST 28 26  ALT 18 16*  ALKPHOS 48 41  BILITOT 0.5 0.8  PROT 6.6 6.5  ALBUMIN 3.6 3.4*    Recent Labs Lab 12/05/15 1210 12/06/15 0458 12/07/15 0507  WBC 9.0 9.5 9.1  HGB 7.3*  7.1* 7.0*  HCT 22.3* 21.6* 20.9*  MCV 87.1 86.7 86.0  PLT 164 165 196

## 2015-12-07 NOTE — Progress Notes (Signed)
[facility] PROGRESS NOTE  Albert Stanley [id number] DOB: 05/31/1946 DOA: 12/03/2015  PCP: [name], MD  Brief HPI: 70 year old African-American male with a past medical history of chronic kidney disease, bladder cancer, history of diabetes, history of DVT who was on warfarin presented with right flank pain and was found to have right renal subcapsular hematoma. Patient was hospitalized for further management. He was also noted to have worsening renal failure. Patient was seen by nephrology. Echocardiogram revealed new systolic CHF. Cardiology was consulted.  Past medical history:  Past Medical History  Diagnosis Date  . Hypertension   . Diabetes mellitus without complication ([place])   . Asthma   . Hyperlipidemia   . DVT (deep venous thrombosis) ([place])   . Renal insufficiency   . Bladder cancer ([place]) dx'd 1990    surg only    Consultants: Nephrology. Phone discussion with urology. Cardiology  Procedures:  Venous Doppler of the lower extremities No acute DVT in either lower extremity. Chronic DVT Right leg.  ECHO Study Conclusions - Left ventricle: The cavity size was mildly dilated. There wasmoderate concentric hypertrophy. Systolic function was moderatelyreduced. The estimated ejection fraction was in the range of 35%to 40%. Hypokinesis of the inferolateral and inferoseptalmyocardium. - Aortic valve: There was trivial regurgitation. - Mitral valve: There was moderate regurgitation. - Left atrium: The atrium was severely dilated. - Pericardium, extracardiac: A small pericardial effusion wasidentified.  Antibiotics: None  Subjective: Patient feels well this morning. Continues to pass urine. Breathing is improved. Denies any nausea, vomiting. Abdominal pain has improved.   Objective: Vital Signs  Filed Vitals:   12/06/15 0418 12/06/15 1156 12/06/15 2217 12/07/15 0646  BP: 148/89 122/63 139/98 138/73  Pulse: 102 99 95 92  Temp: 98.3 F (36.8 C)   99 F (37.2 C) 98.7 F (37.1 C)  TempSrc: Oral  Oral Oral  Resp: 18  18 18   Height:      Weight:      SpO2: 95%  95% 94%    Intake/Output Summary (Last 24 hours) at 12/07/15 0738 Last data filed at 12/07/15 [id number]  Gross per 24 hour  Intake    455 ml  Output   2400 ml  Net  -1945 ml   Filed Weights   12/04/15 0000 12/04/15 1825  Weight: 111.131 kg (245 lb) 117.073 kg (258 lb 1.6 oz)    General appearance: alert, cooperative, appears stated age and no distress Resp: Continues to show improved air entry bilaterally. Few crackles at bases. No wheezing or rhonchi.  Cardio: regular rate and rhythm, S1, S2 normal, no murmur, click, rub or gallop. GI: soft, non-tender; bowel sounds normal; no masses,  no organomegaly Extremities: Edema is noted. Bilateral lower extremities 1-2+ Neurologic: Awake and alert. Oriented 3. No focal neurological deficits.  Lab Results:  Basic Metabolic Panel:  Recent Labs Lab 12/04/15 0022 12/05/15 0616 12/06/15 0458 12/07/15 0507  NA 140 143 138 133*  K 4.4 4.4 4.0 3.9  CL 105 109 103 97*  CO2 23 21* 22 23  GLUCOSE 180* 77 113* 143*  BUN 67* 65* 73* 84*  CREATININE 5.48* 6.04* 6.59* 7.16*  CALCIUM 8.8* 9.3 9.0 8.7*   Liver Function Tests:  Recent Labs Lab 12/04/15 0022 12/05/15 0616  AST 28 26  ALT 18 16*  ALKPHOS 48 41  BILITOT 0.5 0.8  PROT 6.6 6.5  ALBUMIN 3.6 3.4*    Recent Labs Lab 12/04/15 0022  LIPASE 23   CBC:  Recent Labs Lab  12/05/15 0021 12/05/15 0616 12/05/15 1210 12/06/15 0458 12/07/15 0507  WBC 8.6 8.8 9.0 9.5 9.1  HGB 7.5* 7.5* 7.3* 7.1* 7.0*  HCT 22.3* 23.0* 22.3* 21.6* 20.9*  MCV 88.1 87.8 87.1 86.7 86.0  PLT 170 167 164 165 196   CBG:  Recent Labs Lab 12/05/15 1955 12/06/15 0757 12/06/15 1146 12/06/15 1700 12/06/15 2222  GLUCAP 92 104* 153* 202* 69    Recent Results (from the past 240 hour(s))  Urine culture     Status: None   Collection Time: 12/04/15  7:07 AM  Result Value Ref  Range Status   Specimen Description URINE, CLEAN CATCH  Final   Special Requests NONE  Final   Culture   Final    NO GROWTH 1 DAY Performed at [facility]    Report Status 12/05/2015 FINAL  Final      Studies/Results: No results found.  Medications:  Scheduled: . amLODipine  10 mg Oral Daily  . atorvastatin  40 mg Oral q1800  . calcitRIOL  0.5 mcg Oral Daily  . carvedilol  6.25 mg Oral BID WC  . docusate sodium  100 mg Oral BID  . furosemide  160 mg Intravenous Q6H  . insulin aspart  0-9 Units Subcutaneous TID WC  . metolazone  5 mg Oral BID  . sodium chloride flush  3 mL Intravenous Q12H   Continuous: . sodium chloride 10 mL/hr at 12/05/15 [id number]   [id number] **OR** acetaminophen, albuterol, alum & mag hydroxide-simeth, cyclobenzaprine, famotidine, hydrALAZINE, HYDROmorphone (DILAUDID) injection, ondansetron **OR** ondansetron (ZOFRAN) IV, oxyCODONE  Assessment/Plan:  Principal Problem:   Renal hematoma, right Active Problems:   CORONARY ATHEROSCLEROSIS NATIVE CORONARY ARTERY   CKD (chronic kidney disease), stage IV ([name])   ARF (acute renal failure) ([name])   Normocytic anemia   Anticoagulant long-term use   History of DVT (deep vein thrombosis)   Renal hematoma   Acute systolic CHF (congestive heart failure) ([place])    Right renal subcapsular hematoma Patient's right flank pain have improved. Hemoglobin remains low but stable. Disscussed at the time of admission with Dr. Matilde Sprang with urology. No indication for surgical intervention. Anticoagulation has been held. INR was reversed with vitamin K and Kcentra.  Normocytic anemia secondary to chronic kidney disease as well as acute blood loss Hemoglobin is noted to be low but stable. He may benefit from blood transfusion. Discussed with the Dr. Jonnie Finner with nephrology. He would like to wait for one to 2 more days till we are able to reduce his volume before transfusing. I think this is reasonable. Continue  to monitor. Transfuse if it drops significantly below 7.   Acute on chronic kidney disease, possibly stage III Patient's creatinine continues to rise. He is making adequate amount of urine currently. Nephrology is managing and we appreciate their input. Patient is on IV Lasix. Ins and outs. Check daily weights as well.  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 This is new compared to before. He has been seen by cardiology in the past for preoperative clearance. He underwent a stress test in 2011, which was low risk. Her urology has seen the patient. They do not plan any intervention in the near future. Patient has had a run of asymptomatic nonsustained VT. His potassium is normal. He was started on carvedilol. No Ace inhibitors or ARB due to renal failure. Check daily weights. Repeat chest x-ray from this morning continues to show pulmonary edema.  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 INR was 2.15. He was given vitamin K and Kcentra. Holding his Coumadin for now due to active bleeding. Lower extremity venous Dopplers does not show any new acute DVT. 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 This is somewhat reassuring. There is no immediate need for any kind of intervention at this time. Will continue to hold his warfarin for now while we're managing his multiple acute issues. Will need to discuss with urology prior to discharge as to if and when warfarin can be reinitiated.  History of diabetes mellitus type 2 Sliding scale insulin coverage will be continued. HbA1c is 5.8, implying perhaps too aggressive control. He is on glipizide and Januvia at home, both of which have been held. Occasional episodes of low blood glucose levels. Continue to monitor.   Right kidney complex lesion This will need to be further characterized with a repeat CT or MRI after acute  episode has subsided, preferably after a few months. He will need to follow-up with his urologist in the next few weeks.  History of mild coronary artery disease based on cardiac catheterization in 2002 He had a low-risk stress test for preoperative evaluation in 2011. Stable. Continue home medications. See above.  History of essential Hypertension Monitor blood pressures closely. Hydralazine as needed. Continue amlodipine.    DVT Prophylaxis: TED is the only option at this time  Code Status: Full code  Family Communication: Discussed with the patient  Disposition Plan: Continue management as outlined above. Continue to mobilize.    LOS: 3 days   [facility] Pager [phone] 12/07/2015, 7:38 AM  If 7PM-7AM, please contact night-coverage at [email or web address], password [facility]

## 2015-12-08 LAB — IRON AND TIBC
Iron: 17 ug/dL — ABNORMAL LOW (ref 45–182)
SATURATION RATIOS: 8 % — AB (ref 17.9–39.5)
TIBC: 213 ug/dL — ABNORMAL LOW (ref 250–450)
UIBC: 196 ug/dL

## 2015-12-08 LAB — CBC
HEMATOCRIT: 20.5 % — AB (ref 39.0–52.0)
Hemoglobin: 6.8 g/dL — CL (ref 13.0–17.0)
MCH: 28.7 pg (ref 26.0–34.0)
MCHC: 33.2 g/dL (ref 30.0–36.0)
MCV: 86.5 fL (ref 78.0–100.0)
PLATELETS: 194 10*3/uL (ref 150–400)
RBC: 2.37 MIL/uL — ABNORMAL LOW (ref 4.22–5.81)
RDW: 15.1 % (ref 11.5–15.5)
WBC: 8.4 10*3/uL (ref 4.0–10.5)

## 2015-12-08 LAB — BASIC METABOLIC PANEL
ANION GAP: 14 (ref 5–15)
BUN: 98 mg/dL — AB (ref 6–20)
CALCIUM: 8.7 mg/dL — AB (ref 8.9–10.3)
CO2: 24 mmol/L (ref 22–32)
CREATININE: 7.51 mg/dL — AB (ref 0.61–1.24)
Chloride: 95 mmol/L — ABNORMAL LOW (ref 101–111)
GFR calc Af Amer: 8 mL/min — ABNORMAL LOW (ref >60)
GFR, EST NON AFRICAN AMERICAN: 6 mL/min — AB (ref >60)
GLUCOSE: 89 mg/dL (ref 65–99)
Potassium: 3.9 mmol/L (ref 3.5–5.1)
Sodium: 133 mmol/L — ABNORMAL LOW (ref 135–145)

## 2015-12-08 LAB — FERRITIN: FERRITIN: 544 ng/mL — AB (ref 24–336)

## 2015-12-08 LAB — FOLATE: FOLATE: 15.5 ng/mL (ref >5.9)

## 2015-12-08 LAB — GLUCOSE, CAPILLARY
GLUCOSE-CAPILLARY: 72 mg/dL (ref 65–99)
GLUCOSE-CAPILLARY: 141 mg/dL — AB (ref 65–99)
Glucose-Capillary: 92 mg/dL (ref 65–99)
Glucose-Capillary: 131 mg/dL — ABNORMAL HIGH (ref 65–99)

## 2015-12-08 LAB — PREPARE RBC (CROSSMATCH)

## 2015-12-08 LAB — VITAMIN B12: Vitamin B-12: 661 pg/mL (ref 180–914)

## 2015-12-08 MED ORDER — FUROSEMIDE 10 MG/ML IJ SOLN
40.0000 mg | Freq: Once | INTRAMUSCULAR | Status: AC
  Administered 2015-12-08: 40 mg via INTRAVENOUS
  Filled 2015-12-08: qty 4

## 2015-12-08 MED ORDER — SODIUM CHLORIDE 0.9 % IV SOLN
Freq: Once | INTRAVENOUS | Status: AC
  Administered 2015-12-08: 10:00:00 via INTRAVENOUS

## 2015-12-08 MED ORDER — SODIUM CHLORIDE 0.9 % IV SOLN
125.0000 mg | INTRAVENOUS | Status: DC
  Administered 2015-12-08 – 2015-12-18 (×6): 125 mg via INTRAVENOUS
  Filled 2015-12-08 (×11): qty 10

## 2015-12-08 NOTE — Progress Notes (Addendum)
[facility] Progress Note   Subjective: 3.3 L UOP yest, wt's down, on po lasix.  Creat up 7.6.  Feeling tired.    Filed Vitals:   12/08/15 0605 12/08/15 0906 12/08/15 1012 12/08/15 1034  BP: 137/84 127/109 134/82 138/70  Pulse:  76 72 70  Temp:   98.9 F (37.2 C) 98.8 F (37.1 C)  TempSrc:   Oral Oral  Resp:   19 18  Height:      Weight:      SpO2:   93% 94%    Inpatient medications: . amLODipine  10 mg Oral Daily  . atorvastatin  40 mg Oral q1800  . calcitRIOL  0.5 mcg Oral Daily  . carvedilol  6.25 mg Oral BID WC  . docusate sodium  100 mg Oral BID  . furosemide  40 mg Intravenous Once  . furosemide  160 mg Oral TID  . insulin aspart  0-9 Units Subcutaneous TID WC  . metolazone  10 mg Oral Daily  . sodium chloride flush  3 mL Intravenous Q12H   . sodium chloride Stopped (12/08/15 1015)   acetaminophen **OR** acetaminophen, albuterol, alum & mag hydroxide-simeth, bisacodyl, cyclobenzaprine, famotidine, HYDROmorphone (DILAUDID) injection, ondansetron **OR** ondansetron (ZOFRAN) IV, oxyCODONE  Exam: Alert obese AAM no distress, calm JVP 12-14 cm Chest improved , faint rales bilat bases RRR no MRG aBd obese , soft ntnd no mass or ascites Ext 2+ pitting pretib edema bilat LE's No UE edema, no sacral edema Neuro no asterixis, Ox 3, calm  UA - 0-5 rbc/ wbc/ epi, prot 100 , 1.012 Na 140 K 4.4 CO2 23 BUN 67 Creat 5.48 Alb 3.6 LFT's ok Hb 8.0 WBC 7k CXR 2/22 > bilat CHF, significant CXR 2/25 improving CHF   Home meds > allopurinol, norvasc, Rocaltrol, Flexeril, Cardura, iron pills, flonase, Lasix 80/d, glipizide, indapamide, januvia, percocet prn/ oxycodone prn, rosuvastatin, warfarin, clonidine, zolpidem   Assessment: 1 CKD stage 4/5 - baseline creat 3-4.  Creat 5 on admission and rising w diuresis.  Still some extra vol but improved significantly.  Dec'd lasix to po 160 bid. Will hold lasix now. Creat rising.  May need HD next 24-48 hrs.  Will  move patient over to [facility] and if no better 24-48 hrs get tunneled cath placed and start HD.  Have d/w patient who agrees w plan.   2 Pulm edema / LE edema - better 3 R flank pain/ subcapsular hematoma - rec'd vit K and Kcentra , per primary  4 HTN on norvasc/ coreg, bp's good 5 Anemia of CKD- Hb 7.1, check anemia panel and stool guiac. Low tsat , ordered IV iron load. Getting prbc's x 1 today   6 Hx bladder cancer 7 Renal cysts, some complex lesions - needs MRI when more stable 8 Systolic CHF - no cath for now per cardiology; reassess when on dialysis. 9 Constipated - dulcolax ordered   Plan - as above   Kelly Splinter MD [facility] pager [phone]    cell [phone] 12/08/2015, 11:56 AM    Recent Labs Lab 12/06/15 0458 12/07/15 0507 12/08/15 0503  NA 138 133* 133*  K 4.0 3.9 3.9  CL 103 97* 95*  CO2 22 23 24   GLUCOSE 113* 143* 89  BUN 73* 84* 98*  CREATININE 6.59* 7.16* 7.51*  CALCIUM 9.0 8.7* 8.7*    Recent Labs Lab 12/04/15 0022 12/05/15 0616  AST 28 26  ALT 18 16*  ALKPHOS 48 41  BILITOT 0.5 0.8  PROT 6.6 6.5  ALBUMIN 3.6 3.4*    Recent Labs Lab 12/06/15 0458 12/07/15 0507 12/08/15 0503  WBC 9.5 9.1 8.4  HGB 7.1* 7.0* 6.8*  HCT 21.6* 20.9* 20.5*  MCV 86.7 86.0 86.5  PLT 165 196 194

## 2015-12-08 NOTE — Progress Notes (Addendum)
[facility] PROGRESS NOTE  HATCH PITZER [id number] DOB: January 20, 1946 DOA: 12/03/2015  PCP: [name], MD  Brief HPI: 70 year old African-American male with a past medical history of chronic kidney disease, bladder cancer, history of diabetes, history of DVT who was on warfarin presented with right flank pain and was found to have right renal subcapsular hematoma. Patient was hospitalized for further management. He was also noted to have worsening renal failure. Patient was seen by nephrology. Echocardiogram revealed new systolic CHF. Cardiology was consulted.  Past medical history:  Past Medical History  Diagnosis Date  . Hypertension   . Diabetes mellitus without complication ([place])   . Asthma   . Hyperlipidemia   . DVT (deep venous thrombosis) ([place])   . Renal insufficiency   . Bladder cancer ([place]) dx'd 1990    surg only    Consultants: Nephrology. Phone discussion with urology. Cardiology  Procedures:  Venous Doppler of the lower extremities No acute DVT in either lower extremity. Chronic DVT Right leg.  ECHO Study Conclusions - Left ventricle: The cavity size was mildly dilated. There wasmoderate concentric hypertrophy. Systolic function was moderatelyreduced. The estimated ejection fraction was in the range of 35%to 40%. Hypokinesis of the inferolateral and inferoseptalmyocardium. - Aortic valve: There was trivial regurgitation. - Mitral valve: There was moderate regurgitation. - Left atrium: The atrium was severely dilated. - Pericardium, extracardiac: A small pericardial effusion wasidentified.  Antibiotics: None  Subjective: Patient denies any complaints this morning. Continues to pass urine. Breathing is improved. No abdominal pain.   Objective: Vital Signs  Filed Vitals:   12/07/15 1742 12/07/15 2202 12/08/15 0553 12/08/15 0605  BP:  139/82 161/97 137/84  Pulse:  88 88   Temp:  98.5 F (36.9 C) 98.7 F (37.1 C)   TempSrc:  Oral  Oral   Resp:  18 18   Height:      Weight: 116.892 kg (257 lb 11.2 oz)  113.082 kg (249 lb 4.8 oz)   SpO2:  93% 93%     Intake/Output Summary (Last 24 hours) at 12/08/15 0733 Last data filed at 12/08/15 0554  Gross per 24 hour  Intake    240 ml  Output   3300 ml  Net  -3060 ml   Filed Weights   12/04/15 1825 12/07/15 1742 12/08/15 0553  Weight: 117.073 kg (258 lb 1.6 oz) 116.892 kg (257 lb 11.2 oz) 113.082 kg (249 lb 4.8 oz)    General appearance: alert, cooperative, appears stated age and no distress Resp: Continues to show improved air entry bilaterally. Faint scattered crackles. No wheezing or rhonchi.  Cardio: regular rate and rhythm, S1, S2 normal, no murmur, click, rub or gallop. GI: soft, non-tender; bowel sounds normal; no masses,  no organomegaly Extremities: Edema is noted. Bilateral lower extremities 1-2+ Neurologic: Awake and alert. Oriented 3. No focal neurological deficits.  Lab Results:  Basic Metabolic Panel:  Recent Labs Lab 12/04/15 0022 12/05/15 0616 12/06/15 0458 12/07/15 0507 12/08/15 0503  NA 140 143 138 133* 133*  K 4.4 4.4 4.0 3.9 3.9  CL 105 109 103 97* 95*  CO2 23 21* 22 23 24   GLUCOSE 180* 77 113* 143* 89  BUN 67* 65* 73* 84* 98*  CREATININE 5.48* 6.04* 6.59* 7.16* 7.51*  CALCIUM 8.8* 9.3 9.0 8.7* 8.7*   Liver Function Tests:  Recent Labs Lab 12/04/15 0022 12/05/15 0616  AST 28 26  ALT 18 16*  ALKPHOS 48 41  BILITOT 0.5 0.8  PROT 6.6 6.5  ALBUMIN 3.6 3.4*    Recent Labs Lab 12/04/15 0022  LIPASE 23   CBC:  Recent Labs Lab 12/05/15 0616 12/05/15 1210 12/06/15 0458 12/07/15 0507 12/08/15 0503  WBC 8.8 9.0 9.5 9.1 8.4  HGB 7.5* 7.3* 7.1* 7.0* 6.8*  HCT 23.0* 22.3* 21.6* 20.9* 20.5*  MCV 87.8 87.1 86.7 86.0 86.5  PLT 167 164 165 196 194   CBG:  Recent Labs Lab 12/06/15 2222 12/07/15 0741 12/07/15 1206 12/07/15 1716 12/07/15 2209  GLUCAP 69 125* 115* 179* 92    Recent Results (from the past 240 hour(s))   Urine culture     Status: None   Collection Time: 12/04/15  7:07 AM  Result Value Ref Range Status   Specimen Description URINE, CLEAN CATCH  Final   Special Requests NONE  Final   Culture   Final    NO GROWTH 1 DAY Performed at [facility]    Report Status 12/05/2015 FINAL  Final      Studies/Results: Dg Chest 2 View  12/07/2015  CLINICAL DATA:  70 year old male with a history of shortness of breath EXAM: CHEST - 2 VIEW COMPARISON:  12/04/2015, 02/25/2015 FINDINGS: Cardiomediastinal silhouette unchanged, with cardiomegaly. Central vascular congestion. No pneumothorax.  No pleural effusion. Low lung volumes with interlobular septal thickening and interstitial opacities. Aeration similar to the comparison. IMPRESSION: Similar appearance of the chest x-ray with evidence of CHF. Signed, Dulcy Fanny. Earleen Newport, DO [facility] Radiology Electronically Signed   By: Corrie Mckusick D.O.   On: 12/07/2015 09:35    Medications:  Scheduled: . amLODipine  10 mg Oral Daily  . atorvastatin  40 mg Oral q1800  . calcitRIOL  0.5 mcg Oral Daily  . carvedilol  6.25 mg Oral BID WC  . docusate sodium  100 mg Oral BID  . furosemide  160 mg Oral TID  . insulin aspart  0-9 Units Subcutaneous TID WC  . metolazone  10 mg Oral Daily  . sodium chloride flush  3 mL Intravenous Q12H   Continuous: . sodium chloride 10 mL/hr at 12/05/15 [id number]   [id number] **OR** acetaminophen, albuterol, alum & mag hydroxide-simeth, bisacodyl, cyclobenzaprine, famotidine, HYDROmorphone (DILAUDID) injection, ondansetron **OR** ondansetron (ZOFRAN) IV, oxyCODONE  Assessment/Plan:  Principal Problem:   Renal hematoma, right Active Problems:   CORONARY ATHEROSCLEROSIS NATIVE CORONARY ARTERY   CKD (chronic kidney disease), stage IV ([name])   ARF (acute renal failure) ([name])   Normocytic anemia   Anticoagulant long-term use   History of DVT (deep vein thrombosis)   Renal  hematoma   Acute systolic CHF (congestive heart failure) ([name])    Acute on chronic kidney disease, possibly stage III Patient's creatinine continues to rise. He is making adequate amount of urine currently. Nephrology is managing and we appreciate their input. Patient was initially placed on IV Lasix and was changed over to oral Lasix yesterday. Patient's weight is decreasing. Continue to monitor Ins and outs. Check daily weights as well. Discussed with Dr. Jonnie Finner today. He is concerned about patient's rising creatinine. He wants to hold his Lasix for now and transfer the patient to [facility] as he may end up requiring dialysis.  Right renal subcapsular hematoma Patient's right flank pain have improved. Hemoglobin remains low but stable. Disscussed at the time of admission with Dr. Matilde Sprang with urology. No indication for surgical intervention. Anticoagulation has been held. INR was reversed with vitamin K and Kcentra.  Normocytic anemia secondary to chronic kidney disease as well  as acute blood loss Hemoglobin had been stable, but drop below 7 this morning. We will transfuse 1 unit of blood. Continue to monitor.  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 This is new compared to before. He has been seen by cardiology in the past for preoperative clearance. He underwent a stress test in 2011, which was low risk. Cardiology has seen the patient. They do not plan any intervention in the near future. Patient has had a run of asymptomatic nonsustained VT. His potassium is normal. He was started on carvedilol. No Ace inhibitors or ARB due to renal failure. Check daily weights. Repeat chest x-ray showed pulmonary edema. However weight has decreased and patient feels better.   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 INR was 2.15. He was given vitamin K and  Kcentra. Holding his Coumadin for now due to active bleeding. Lower extremity venous Dopplers does not show any new acute DVT. 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 This is somewhat reassuring. There is no immediate need for any kind of intervention at this time. Will continue to hold his warfarin for now while we're managing his multiple acute issues. Will need to discuss with urology prior to discharge as to if and when warfarin can be reinitiated.  History of diabetes mellitus type 2 Sliding scale insulin coverage will be continued. HbA1c is 5.8, implying perhaps too aggressive control. He is on glipizide and Januvia at home, both of which have been held. Occasional episodes of low blood glucose levels. Continue to monitor.   Right kidney complex lesion This will need to be further characterized with a repeat CT or MRI after acute episode has subsided, preferably after a few months. He will need to follow-up with his urologist in the next few weeks.  History of mild coronary artery disease based on cardiac catheterization in 2002 He had a low-risk stress test for preoperative evaluation in 2011. Stable. Continue home medications. See above.  History of essential Hypertension Monitor blood pressures closely. Hydralazine as needed. Continue amlodipine.    DVT Prophylaxis: TED is the only option at this time  Code Status: Full code  Family Communication: Discussed with the patient  Disposition Plan: Continue management as outlined above. Continue to mobilize. Transferred to [facility] today for possible dialysis in next few days.    LOS: 4 days   [facility] Pager [phone] 12/08/2015, 7:33 AM  If 7PM-7AM, please contact night-coverage at [email or web address], password [facility]

## 2015-12-08 NOTE — Progress Notes (Signed)
Report called to [place] on [facility] at [facility], pt and his wife aware of transfer and room information, charge nurse notified [facility] for transport.

## 2015-12-09 DIAGNOSIS — D638 Anemia in other chronic diseases classified elsewhere: Secondary | ICD-10-CM

## 2015-12-09 DIAGNOSIS — S37011D Minor contusion of right kidney, subsequent encounter: Secondary | ICD-10-CM

## 2015-12-09 LAB — CBC
HCT: 21.3 % — ABNORMAL LOW (ref 39.0–52.0)
HEMOGLOBIN: 7.5 g/dL — AB (ref 13.0–17.0)
MCH: 29.2 pg (ref 26.0–34.0)
MCHC: 35.2 g/dL (ref 30.0–36.0)
MCV: 82.9 fL (ref 78.0–100.0)
PLATELETS: 196 10*3/uL (ref 150–400)
RBC: 2.57 MIL/uL — ABNORMAL LOW (ref 4.22–5.81)
RDW: 14.5 % (ref 11.5–15.5)
WBC: 7.9 10*3/uL (ref 4.0–10.5)

## 2015-12-09 LAB — BASIC METABOLIC PANEL
Anion gap: 15 (ref 5–15)
BUN: 99 mg/dL — AB (ref 6–20)
CALCIUM: 8.5 mg/dL — AB (ref 8.9–10.3)
CO2: 22 mmol/L (ref 22–32)
CREATININE: 7.94 mg/dL — AB (ref 0.61–1.24)
Chloride: 95 mmol/L — ABNORMAL LOW (ref 101–111)
GFR calc Af Amer: 7 mL/min — ABNORMAL LOW (ref >60)
GFR calc non Af Amer: 6 mL/min — ABNORMAL LOW (ref >60)
GLUCOSE: 120 mg/dL — AB (ref 65–99)
Potassium: 3.3 mmol/L — ABNORMAL LOW (ref 3.5–5.1)
Sodium: 132 mmol/L — ABNORMAL LOW (ref 135–145)

## 2015-12-09 LAB — GLUCOSE, CAPILLARY
GLUCOSE-CAPILLARY: 124 mg/dL — AB (ref 65–99)
Glucose-Capillary: 123 mg/dL — ABNORMAL HIGH (ref 65–99)
Glucose-Capillary: 137 mg/dL — ABNORMAL HIGH (ref 65–99)

## 2015-12-09 LAB — TYPE AND SCREEN
ABO/RH(D): O POS
ANTIBODY SCREEN: NEGATIVE
Unit division: 0

## 2015-12-09 MED ORDER — DARBEPOETIN ALFA 60 MCG/0.3ML IJ SOSY
60.0000 ug | PREFILLED_SYRINGE | INTRAMUSCULAR | Status: DC
  Administered 2015-12-09: 60 ug via SUBCUTANEOUS
  Filled 2015-12-09 (×2): qty 0.3

## 2015-12-09 MED ORDER — POTASSIUM CHLORIDE 20 MEQ PO PACK
20.0000 meq | PACK | Freq: Two times a day (BID) | ORAL | Status: DC
  Administered 2015-12-09 – 2015-12-18 (×16): 20 meq via ORAL
  Filled 2015-12-09 (×25): qty 1

## 2015-12-09 MED ORDER — FUROSEMIDE 10 MG/ML IJ SOLN
120.0000 mg | Freq: Two times a day (BID) | INTRAVENOUS | Status: DC
  Administered 2015-12-09 – 2015-12-13 (×8): 120 mg via INTRAVENOUS
  Filled 2015-12-09 (×12): qty 12

## 2015-12-09 NOTE — Care Management Important Message (Signed)
Important Message  Patient Details  Name: Albert Stanley MRN: [id number] Date of Birth: [birth year]   Medicare Important Message Given:  Yes    Delorse Lek 12/09/2015, 4:49 PM

## 2015-12-09 NOTE — Progress Notes (Signed)
Patient transferred alert and oriented. Report given to RN previous shift, report given to [facility].

## 2015-12-09 NOTE — Progress Notes (Signed)
[facility] PROGRESS NOTE  Albert Stanley [id number] DOB: [birth year] DOA: 12/03/2015 PCP: [name], MD  Brief narrative 70 year old male with history of right kidney disease stage III, bladder cancer, diabetes mellitus, history of DVT on warfarin was admitted to [facility] long hospital when he presented with right flank pain and found to have right renal subcapsular hematoma. Hospital course prolonged due to progressive worsening of his renal function. Echo showing new systolic CHF. Transferred to [facility] with concern for need of hemodialysis.  Assessment/Plan: Acute on chronic kidney disease stage III Has good urine output but progressively worsening creatinine. Initially treated with IV Lasix and then changed to oral Lasix. Continue strict I/O and daily weight. Nephrology recommends aVF placement with preparation for dialysis.  New onset acute systolic CHF with pulmonary edema and/or extremity edema Symptoms better with IV Lasix. EF of 35-40%. Appreciate cardiology recommendations. Neuro for cardiac cath at this time and will reassess in future. Had stress testing 2011 which was low risk. Started on beta blocker. cannot use ACE inhibitor/ARB due to renal dysfunction.  Right renal subcapsular hematoma Right flank pain and supposedly improved. Hemoglobin low but stable. Hospitalist discussed with Dr. Suzanne Boron diarmid  with urology who recommended no surgical intervention needed. Anticoagulation has been held and INR reversed.  NSVT Cerclage stable. Continue telemetry monitoring. 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 No acute DVT on Doppler lower extremity. 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 Need to discuss with urology prior to discharge if antipronation can be restarted.  Complex lesion over right kidney Needs repeat CT or MRI in few months as  outpatient.  Type 2 diabetes mellitus Continue sliding scale coverage. A1c of 5.8. He is on oral hypoglycemics at home.  Anemia of chronic disease Hemoglobin low but stable. Received 1 unit PRBC on 2/26.  Essential hypertension Continue amlodipine. Stable.  Hypokalemia Replenish  DVT prophylaxis: SCDs Diet: Renal/diabetic   Code Status: Full code Family Communication: None at bedside Disposition Plan: Currently inpatient   Consultants:  Renal  Cardiology  Procedures:  2-D echo  Antibiotics:  None  HPI/Subjective: Seen and examined. Complains of some pain over his right flank  Objective: Filed Vitals:   12/09/15 0458 12/09/15 0900  BP: 132/93 128/72  Pulse: 81 80  Temp: 99.5 F (37.5 C) 99.1 F (37.3 C)  Resp: 22 20    Intake/Output Summary (Last 24 hours) at 12/09/15 1424 Last data filed at 12/09/15 0721  Gross per 24 hour  Intake 510.83 ml  Output   1350 ml  Net -839.17 ml   Filed Weights   12/07/15 1742 12/08/15 0553 12/08/15 2142  Weight: 116.892 kg (257 lb 11.2 oz) 113.082 kg (249 lb 4.8 oz) 110.224 kg (243 lb)    Exam:   General:  Elderly male not in distress  HEENT: No pallor, moist mucosa  Chest: Clear bilaterally  CVS: Normal S1 and S2, no murmurs rub or gallop  GI: Soft, nondistended, nontender, bowel sounds present, mild right CVA tenderness  Musculoskeletal: Warm, no edema  Data Reviewed: Basic Metabolic Panel:  Recent Labs Lab 12/05/15 0616 12/06/15 0458 12/07/15 0507 12/08/15 0503 12/09/15 0818  NA 143 138 133* 133* 132*  K 4.4 4.0 3.9 3.9 3.3*  CL 109 103 97* 95* 95*  CO2 21* 22 23 24 22   GLUCOSE 77 113* 143* 89 120*  BUN 65* 73* 84* 98* 99*  CREATININE 6.04* 6.59* 7.16* 7.51* 7.94*  CALCIUM 9.3 9.0 8.7* 8.7* 8.5*   Liver Function Tests:  Recent Labs Lab 12/04/15 0022 12/05/15 0616  AST 28 26  ALT 18 16*  ALKPHOS 48 41  BILITOT 0.5 0.8  PROT 6.6 6.5  ALBUMIN 3.6 3.4*    Recent Labs Lab  12/04/15 0022  LIPASE 23   No results for input(s): AMMONIA in the last 168 hours. CBC:  Recent Labs Lab 12/05/15 1210 12/06/15 0458 12/07/15 0507 12/08/15 0503 12/09/15 0818  WBC 9.0 9.5 9.1 8.4 7.9  HGB 7.3* 7.1* 7.0* 6.8* 7.5*  HCT 22.3* 21.6* 20.9* 20.5* 21.3*  MCV 87.1 86.7 86.0 86.5 82.9  PLT 164 165 196 194 196   Cardiac Enzymes: No results for input(s): CKTOTAL, CKMB, CKMBINDEX, TROPONINI in the last 168 hours. BNP (last 3 results)  Recent Labs  02/06/15 2204  BNP 53.4    ProBNP (last 3 results) No results for input(s): PROBNP in the last 8760 hours.  CBG:  Recent Labs Lab 12/08/15 1221 12/08/15 1724 12/08/15 2052 12/09/15 0801 12/09/15 1228  GLUCAP 141* 92 131* 124* 123*    Recent Results (from the past 240 hour(s))  Urine culture     Status: None   Collection Time: 12/04/15  7:07 AM  Result Value Ref Range Status   Specimen Description URINE, CLEAN CATCH  Final   Special Requests NONE  Final   Culture   Final    NO GROWTH 1 DAY Performed at [facility]    Report Status 12/05/2015 FINAL  Final     Studies: No results found.  Scheduled Meds: . amLODipine  10 mg Oral Daily  . atorvastatin  40 mg Oral q1800  . calcitRIOL  0.5 mcg Oral Daily  . carvedilol  6.25 mg Oral BID WC  . darbepoetin (ARANESP) injection - NON-DIALYSIS  60 mcg Subcutaneous Q Mon-1800  . docusate sodium  100 mg Oral BID  . ferric gluconate (FERRLECIT/NULECIT) IV  125 mg Intravenous QODAY  . furosemide  120 mg Intravenous BID  . insulin aspart  0-9 Units Subcutaneous TID WC  . metolazone  10 mg Oral Daily  . potassium chloride  20 mEq Oral BID  . sodium chloride flush  3 mL Intravenous Q12H   Continuous Infusions: . sodium chloride Stopped (12/08/15 1802)      Time spent: 25 minutes    Albert Stanley, [name]  [facility] Pager [phone]. If 7PM-7AM, please contact night-coverage at [email or web address], password [facility] 12/09/2015, 2:24 PM  LOS: 5 days

## 2015-12-09 NOTE — Progress Notes (Signed)
[name]   Subjective:   Interval History:  No complaints no shortness of brearh   Objective:  Vital signs in last 24 hours:  Temp:  [98.3 F (36.8 C)-99.5 F (37.5 C)] 99.1 F (37.3 C) (02/27 0900) Pulse Rate:  [74-81] 80 (02/27 0900) Resp:  [19-25] 20 (02/27 0900) BP: (127-135)/(70-93) 128/72 mmHg (02/27 0900) SpO2:  [93 %-100 %] 96 % (02/27 0900) Weight:  [110.224 kg (243 lb)] 110.224 kg (243 lb) (02/26 2142)  Weight change: -6.668 kg (-14 lb 11.2 oz) Filed Weights   12/07/15 1742 12/08/15 0553 12/08/15 2142  Weight: 116.892 kg (257 lb 11.2 oz) 113.082 kg (249 lb 4.8 oz) 110.224 kg (243 lb)    Intake/Output: I/O last 3 completed shifts: In: 852.8 [I.V.:400.8; Blood:342; IV Piggyback:110] Out: [id number] [Urine:3400]   Intake/Output this shift:  Total I/O In: -  Out: 200 [Urine:200]  CVS- RRR RS- CTA ABD- BS present soft non-distended EXT-  3 + edema   Basic Metabolic Panel:  Recent Labs Lab 12/05/15 0616 12/06/15 0458 12/07/15 0507 12/08/15 0503 12/09/15 0818  NA 143 138 133* 133* 132*  K 4.4 4.0 3.9 3.9 3.3*  CL 109 103 97* 95* 95*  CO2 21* 22 23 24 22   GLUCOSE 77 113* 143* 89 120*  BUN 65* 73* 84* 98* 99*  CREATININE 6.04* 6.59* 7.16* 7.51* 7.94*  CALCIUM 9.3 9.0 8.7* 8.7* 8.5*    Liver Function Tests:  Recent Labs Lab 12/04/15 0022 12/05/15 0616  AST 28 26  ALT 18 16*  ALKPHOS 48 41  BILITOT 0.5 0.8  PROT 6.6 6.5  ALBUMIN 3.6 3.4*    Recent Labs Lab 12/04/15 0022  LIPASE 23   No results for input(s): AMMONIA in the last 168 hours.  CBC:  Recent Labs Lab 12/05/15 1210 12/06/15 0458 12/07/15 0507 12/08/15 0503 12/09/15 0818  WBC 9.0 9.5 9.1 8.4 7.9  HGB 7.3* 7.1* 7.0* 6.8* 7.5*  HCT 22.3* 21.6* 20.9* 20.5* 21.3*  MCV 87.1 86.7 86.0 86.5 82.9  PLT 164 165 196 194 196    Cardiac Enzymes: No results for input(s): CKTOTAL, CKMB, CKMBINDEX, TROPONINI in the last 168 hours.  BNP: Invalid  input(s): POCBNP  CBG:  Recent Labs Lab 12/08/15 1221 12/08/15 1724 12/08/15 2052 12/09/15 0801 12/09/15 1228  GLUCAP 141* 92 131* 124* 123*    Microbiology: Results for orders placed or performed during the hospital encounter of 12/03/15  Urine culture     Status: None   Collection Time: 12/04/15  7:07 AM  Result Value Ref Range Status   Specimen Description URINE, CLEAN CATCH  Final   Special Requests NONE  Final   Culture   Final    NO GROWTH 1 DAY Performed at [facility]    Report Status 12/05/2015 FINAL  Final    Coagulation Studies:  Recent Labs  12/07/15 0507  LABPROT 18.5*  INR 1.60*    Urinalysis: No results for input(s): COLORURINE, LABSPEC, PHURINE, GLUCOSEU, HGBUR, BILIRUBINUR, KETONESUR, PROTEINUR, UROBILINOGEN, NITRITE, LEUKOCYTESUR in the last 72 hours.  Invalid input(s): APPERANCEUR    Imaging: No results found.   Medications:   . sodium chloride Stopped (12/08/15 1802)   . amLODipine  10 mg Oral Daily  . atorvastatin  40 mg Oral q1800  . calcitRIOL  0.5 mcg Oral Daily  . carvedilol  6.25 mg Oral BID WC  . docusate sodium  100 mg Oral BID  . ferric gluconate (FERRLECIT/NULECIT) IV  125 mg  Intravenous QODAY  . insulin aspart  0-9 Units Subcutaneous TID WC  . metolazone  10 mg Oral Daily  . 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 Creat 7 recommend dialysis preparation with AVF placement  2 Pulm edema / LE edema - better  No lasix at present will restart   3 R flank pain/ subcapsular hematoma - rec'd vit K and Kcentra , per primary  4 HTN on norvasc/ coreg, bp's good 5 Anemia of CKD- Hb 7.1, check anemia panel and stool guiac. Low tsat , ordered IV iron load.  Getting prbc's x 1 today  6 Hx bladder cancer 7 Renal cysts, some complex lesions - needs MRI when more stable 8 Systolic CHF - no cath for now per cardiology; reassess when on dialysis. 9 Constipated - dulcolax ordered    LOS: 5 Albert Stanley W @TODAY @12 :51 PM

## 2015-12-10 ENCOUNTER — Ambulatory Visit: Payer: Medicare Other | Admitting: Dietician

## 2015-12-10 DIAGNOSIS — N184 Chronic kidney disease, stage 4 (severe): Secondary | ICD-10-CM

## 2015-12-10 LAB — BASIC METABOLIC PANEL
ANION GAP: 13 (ref 5–15)
BUN: 101 mg/dL — ABNORMAL HIGH (ref 6–20)
CHLORIDE: 94 mmol/L — AB (ref 101–111)
CO2: 23 mmol/L (ref 22–32)
CREATININE: 8.11 mg/dL — AB (ref 0.61–1.24)
Calcium: 8.5 mg/dL — ABNORMAL LOW (ref 8.9–10.3)
GFR calc non Af Amer: 6 mL/min — ABNORMAL LOW (ref >60)
GFR, EST AFRICAN AMERICAN: 7 mL/min — AB (ref >60)
Glucose, Bld: 129 mg/dL — ABNORMAL HIGH (ref 65–99)
Potassium: 3.6 mmol/L (ref 3.5–5.1)
SODIUM: 130 mmol/L — AB (ref 135–145)

## 2015-12-10 LAB — GLUCOSE, CAPILLARY
GLUCOSE-CAPILLARY: 162 mg/dL — AB (ref 65–99)
Glucose-Capillary: 135 mg/dL — ABNORMAL HIGH (ref 65–99)
Glucose-Capillary: 159 mg/dL — ABNORMAL HIGH (ref 65–99)
Glucose-Capillary: 141 mg/dL — ABNORMAL HIGH (ref 65–99)

## 2015-12-10 LAB — CBC
HCT: 23 % — ABNORMAL LOW (ref 39.0–52.0)
HEMOGLOBIN: 8 g/dL — AB (ref 13.0–17.0)
MCH: 28.9 pg (ref 26.0–34.0)
MCHC: 34.8 g/dL (ref 30.0–36.0)
MCV: 83 fL (ref 78.0–100.0)
PLATELETS: 224 10*3/uL (ref 150–400)
RBC: 2.77 MIL/uL — AB (ref 4.22–5.81)
RDW: 14.5 % (ref 11.5–15.5)
WBC: 7.3 10*3/uL (ref 4.0–10.5)

## 2015-12-10 LAB — MAGNESIUM: MAGNESIUM: 2.2 mg/dL (ref 1.7–2.4)

## 2015-12-10 LAB — OCCULT BLOOD X 1 CARD TO LAB, STOOL: FECAL OCCULT BLD: NEGATIVE

## 2015-12-10 NOTE — Progress Notes (Signed)
[facility] ROUNDING NOTE   Subjective:   Interval History:  Walking around room no complaints  Objective:  Vital signs in last 24 hours:  Temp:  [98.7 F (37.1 C)-99.3 F (37.4 C)] 98.7 F (37.1 C) (02/28 0900) Pulse Rate:  [60-84] 75 (02/28 0900) Resp:  [18-19] 19 (02/28 0900) BP: (123-140)/(71-80) 127/78 mmHg (02/28 0900) SpO2:  [94 %-96 %] 94 % (02/28 0900) Weight:  [111.902 kg (246 lb 11.2 oz)] 111.902 kg (246 lb 11.2 oz) (02/28 0234)  Weight change: 1.678 kg (3 lb 11.2 oz) Filed Weights   12/08/15 0553 12/08/15 2142 12/10/15 0234  Weight: 113.082 kg (249 lb 4.8 oz) 110.224 kg (243 lb) 111.902 kg (246 lb 11.2 oz)    Intake/Output: I/O last 3 completed shifts: In: 1262 [P.O.:1200; IV Piggyback:62] Out: 3075 [Urine:3075]   Intake/Output this shift:  Total I/O In: 240 [P.O.:240] Out: 250 [Urine:250]  CVS- RRR RS- CTA ABD- BS present soft non-distended EXT-  3 + edema   Basic Metabolic Panel:  Recent Labs Lab 12/06/15 0458 12/07/15 0507 12/08/15 0503 12/09/15 0818 12/10/15 0705  NA 138 133* 133* 132* 130*  K 4.0 3.9 3.9 3.3* 3.6  CL 103 97* 95* 95* 94*  CO2 22 23 24 22 23   GLUCOSE 113* 143* 89 120* 129*  BUN 73* 84* 98* 99* 101*  CREATININE 6.59* 7.16* 7.51* 7.94* 8.11*  CALCIUM 9.0 8.7* 8.7* 8.5* 8.5*  MG  --   --   --   --  2.2    Liver Function Tests:  Recent Labs Lab 12/04/15 0022 12/05/15 0616  AST 28 26  ALT 18 16*  ALKPHOS 48 41  BILITOT 0.5 0.8  PROT 6.6 6.5  ALBUMIN 3.6 3.4*    Recent Labs Lab 12/04/15 0022  LIPASE 23   No results for input(s): AMMONIA in the last 168 hours.  CBC:  Recent Labs Lab 12/06/15 0458 12/07/15 0507 12/08/15 0503 12/09/15 0818 12/10/15 0705  WBC 9.5 9.1 8.4 7.9 7.3  HGB 7.1* 7.0* 6.8* 7.5* 8.0*  HCT 21.6* 20.9* 20.5* 21.3* 23.0*  MCV 86.7 86.0 86.5 82.9 83.0  PLT 165 196 194 196 224    Cardiac Enzymes: No results for input(s): CKTOTAL, CKMB, CKMBINDEX, TROPONINI in the  last 168 hours.  BNP: Invalid input(s): POCBNP  CBG:  Recent Labs Lab 12/09/15 0801 12/09/15 1228 12/09/15 1651 12/10/15 0626 12/10/15 1317  GLUCAP 124* 123* 137* 135* 162*    Microbiology: Results for orders placed or performed during the hospital encounter of 12/03/15  Urine culture     Status: None   Collection Time: 12/04/15  7:07 AM  Result Value Ref Range Status   Specimen Description URINE, CLEAN CATCH  Final   Special Requests NONE  Final   Culture   Final    NO GROWTH 1 DAY Performed at [facility]    Report Status 12/05/2015 FINAL  Final    Coagulation Studies: No results for input(s): LABPROT, INR in the last 72 hours.  Urinalysis: No results for input(s): COLORURINE, LABSPEC, PHURINE, GLUCOSEU, HGBUR, BILIRUBINUR, KETONESUR, PROTEINUR, UROBILINOGEN, NITRITE, LEUKOCYTESUR in the last 72 hours.  Invalid input(s): APPERANCEUR    Imaging: No results found.   Medications:   . sodium chloride Stopped (12/08/15 1802)   . amLODipine  10 mg Oral Daily  . atorvastatin  40 mg Oral q1800  . calcitRIOL  0.5 mcg Oral Daily  . carvedilol  6.25 mg Oral BID WC  . darbepoetin (ARANESP) injection -  NON-DIALYSIS  60 mcg Subcutaneous Q Mon-1800  . docusate sodium  100 mg Oral BID  . ferric gluconate (FERRLECIT/NULECIT) IV  125 mg Intravenous QODAY  . furosemide  120 mg Intravenous BID  . insulin aspart  0-9 Units Subcutaneous TID WC  . metolazone  10 mg Oral Daily  . potassium chloride  20 mEq Oral BID  . 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 Creat 7 recommend dialysis preparation with AVF placement  2 Pulm edema / LE edema - better No lasix at present will restart  3 R flank  pain/ subcapsular hematoma - rec'd vit K and Kcentra , per primary  4 HTN on norvasc/ coreg, bp's good 5 Anemia of CKD- Hb 7.1, check anemia panel and stool guiac. Low tsat , ordered IV iron load. Getting prbc's x 1 today  6 Hx bladder cancer 7 Renal cysts, some complex lesions - needs MRI when more stable 8 Systolic CHF - no cath for now per cardiology; reassess when on dialysis. 9 Constipated - dulcolax ordered  Patient agreeable to dialysis consult VVS for access    LOS: 6 Teron Blais W @TODAY @1 :33 PM

## 2015-12-10 NOTE — Consult Note (Signed)
Hospital Consult    Reason for Consult:  In need of [facility] & permanent HD access Referring Physician:  Justin Mend MRN #:  [id number]  History of Present Illness: This is a 70 y.o. male who presented to the hospital a week ago with acute right flank pain.  CT in ER revealed a right subscapular hemorrhage as well as bilateral simple and complex renal lesions, likely renal cysts based on prior studies per rad report.  There were no stones or hydronephrosis noted.   He states that he has been going to a PCP and a kidney doctor, but really doesn't know about his kidney disease.  He has been having some edema that has been on going for about 3 months after taking prednisone for musculoskeletal pain.  He has had some more shortness of breath.  He sleeps on 2 pillows chronically.    He does have a hx of DVT and is on coumadin.  He is on crestor for cholesterol management.  He takes Januvia for diabetes.  He takes a CCB for blood pressure control.  He does have a hx of DVT.  The first one he does not know why, but he got the 2nd one in 2011 when he had a hip replacement.  No hx of PE.  He is on coumadin, but has not been on any anticoagulation since hospitalization due to subscapular hemorrhage.  He quit smoking in 1990.  Past Medical History  Diagnosis Date  . Hypertension   . Diabetes mellitus without complication ([place])   . Asthma   . Hyperlipidemia   . DVT (deep venous thrombosis) ([place])   . Renal insufficiency   . Bladder cancer ([place]) dx'd 1990    surg only    Past Surgical History  Procedure Laterality Date  . Back surgery      2 times  . Joint replacement      hip  . Arthroscopic knee surgery    . Cystoscopy      Allergies  Allergen Reactions  . Meperidine Hcl     Unknown  . Penicillins     "Knocks me out."   Has patient had a PCN reaction causing immediate rash, facial/tongue/throat swelling, SOB or lightheadedness with hypotension: yes- blacked out Has patient had a PCN reaction  causing severe rash involving mucus membranes or skin necrosis: unknown Has patient had a PCN reaction that required hospitalization: at MD office Has patient had a PCN reaction occurring within the last 10 years: no If all of the above answers are "NO", then may proceed with Cephalosporin use.   . Sulfonamide Derivatives     Unknown.   . Povidone-Iodine Rash    Prior to Admission medications   Medication Sig Start Date End Date Taking? Authorizing Provider  allopurinol (ZYLOPRIM) 300 MG tablet Take 150 mg by mouth daily.     Yes Historical Provider, MD  amLODipine (NORVASC) 10 MG tablet Take 10 mg by mouth daily. 12/02/15  Yes Historical Provider, MD  calcitRIOL (ROCALTROL) 0.5 MCG capsule Take 0.5 mcg by mouth daily.  06/19/13  Yes Historical Provider, MD  cyclobenzaprine (FLEXERIL) 5 MG tablet Take 5 mg by mouth 3 (three) times daily as needed for muscle spasms.   Yes Historical Provider, MD  doxazosin (CARDURA) 4 MG tablet Take 4 mg by mouth daily. 12/02/15  Yes Historical Provider, MD  ferrous sulfate 325 (65 FE) MG tablet Take 325 mg by mouth daily with breakfast.     Yes Historical Provider, MD  fluticasone (FLONASE) 50 MCG/ACT nasal spray Place 1 spray into both nostrils daily as needed for allergies or rhinitis.   Yes Historical Provider, MD  furosemide (LASIX) 80 MG tablet Take 80 mg by mouth daily. 12/02/15  Yes Historical Provider, MD  glipiZIDE (GLUCOTROL XL) 5 MG 24 hr tablet Take 5 mg by mouth daily.     Yes Historical Provider, MD  indapamide (LOZOL) 1.25 MG tablet Take 1.25 mg by mouth every morning.     Yes Historical Provider, MD  JANUVIA 50 MG tablet Take 25 mg by mouth daily.  05/25/13  Yes Historical Provider, MD  ondansetron (ZOFRAN ODT) 8 MG disintegrating tablet Take 1 tablet (8 mg total) by mouth every 8 (eight) hours as needed for nausea or vomiting. 02/26/15  Yes Jola Schmidt, MD  oxyCODONE-acetaminophen (PERCOCET) 7.5-325 MG tablet Take 1 tablet by mouth every 8 (eight)  hours as needed. For pain 10/30/15  Yes Historical Provider, MD  oxyCODONE-acetaminophen (PERCOCET/ROXICET) 5-325 MG per tablet Take 1 tablet by mouth every 6 (six) hours as needed for severe pain. 10/19/14  Yes Charlett Blake, MD  oxymetazoline (AFRIN) 0.05 % nasal spray Place 1 spray into both nostrils 2 (two) times daily as needed for congestion.   Yes Historical Provider, MD  rosuvastatin (CRESTOR) 5 MG tablet Take 5 mg by mouth daily.   Yes Historical Provider, MD  warfarin (COUMADIN) 5 MG tablet Take 2.5-5 mg by mouth daily at 6 PM. Taking 5 on Monday, Tuesday, Wednesday, Thursday and Friday and take 2.5mg  on Saturday and Sunday 06/19/13  Yes Historical Provider, MD  cloNIDine (CATAPRES) 0.1 MG tablet Take 1 tablet (0.1 mg total) by mouth every 8 (eight) hours as needed (withdrawl symptoms). Patient not taking: Reported on 12/04/2015 02/25/15   Jola Schmidt, MD  zolpidem (AMBIEN) 5 MG tablet Take 1 tablet (5 mg total) by mouth at bedtime as needed for sleep. Patient not taking: Reported on 12/04/2015 02/25/15   Jola Schmidt, MD    Social History   Social History  . Marital Status: Married    Spouse Name: N/A  . Number of Children: N/A  . Years of Education: N/A   Occupational History  . Retired    Social History Main Topics  . Smoking status: Former Research scientist (life sciences)  . Smokeless tobacco: Never Used  . Alcohol Use: No     Comment: Used to drink on the weekends, quit 2 years ago  . Drug Use: No  . Sexual Activity: No   Other Topics Concern  . Not on file   Social History Narrative   Lives with wife and daughter.     Family History  Problem Relation Age of Onset  . Arthritis Mother   . Cancer Father   . Heart disease Father   . Hypertension Sister   . Alcohol abuse Brother   . Diabetes Daughter     ROS: [x]  Positive   [ ]  Negative   [ ]  All sytems reviewed and are negative  Cardiovascular: []  chest pain/pressure []  palpitations [x]  SOB lying flat []  DOE []  pain in legs  while walking []  pain in legs at rest []  pain in legs at night []  non-healing ulcers [x]  hx of DVT [x]  swelling in legs  Pulmonary: []  productive cough [x]  asthma/wheezing []  home O2  Neurologic: []  weakness in []  arms []  legs []  numbness in []  arms []  legs []  hx of CVA []  mini stroke [] difficulty speaking or slurred speech []  temporary loss of vision  in one eye []  dizziness  Hematologic: []  hx of cancer []  bleeding problems []  problems with blood clotting easily  Endocrine:   [x]  diabetes []  thyroid disease  GI []  vomiting blood []  blood in stool  GU: [x]  CKD/renal failure []  HD--[]  M/W/F or []  T/T/S []  burning with urination []  blood in urine [x]  hx bladder cancer dx in 1990  Psychiatric: []  anxiety []  depression  Musculoskeletal: []  arthritis [x]  joint pain  Integumentary: []  rashes []  ulcers  Constitutional: []  fever []  chills   Physical Examination  Filed Vitals:   12/10/15 0517 12/10/15 0900  BP: 128/71 127/78  Pulse: 84 75  Temp: 99.3 F (37.4 C) 98.7 F (37.1 C)  Resp:  19   Body mass index is 34.42 kg/(m^2).  General:  WDWN in NAD Gait: Not observed HENT: WNL, normocephalic Pulmonary: normal non-labored breathing, without Rales, rhonchi,  wheezing Cardiac: regular, without  Murmurs, rubs or gallops; without carotid bruits Abdomen: obese soft, NT/ND, no masses Skin: without rashes Vascular Exam/Pulses:  Right Left  Radial 2+ (normal) 2+ (normal)  Ulnar Unable to palpate  Unable to palpate  Femoral 2+ (normal) 2+ (normal)  DP 2+ (normal) 2+ (normal)  PT Unable to palpate  Unable to palpate    Extremities: without ischemic changes, without Gangrene , without cellulitis; without open wounds;  Musculoskeletal: no muscle wasting or atrophy  Neurologic: A&O X 3; Appropriate Affect ; SENSATION: normal; MOTOR FUNCTION:  moving all extremities equally. Speech is fluent/normal Lymph:  No inguinal lymphadenopathy   CBC      Component Value Date/Time   WBC 7.3 12/10/2015 0705   WBC 5.7 12/30/2006 0949   RBC 2.77* 12/10/2015 0705   RBC 2.58* 12/07/2015 2122   RBC 3.75* 12/30/2006 0949   HGB 8.0* 12/10/2015 0705   HGB 11.4* 12/30/2006 0949   HCT 23.0* 12/10/2015 0705   HCT 33.7* 12/30/2006 0949   PLT 224 12/10/2015 0705   PLT 248 12/30/2006 0949   MCV 83.0 12/10/2015 0705   MCV 89.8 12/30/2006 0949   MCH 28.9 12/10/2015 0705   MCH 30.5 12/30/2006 0949   MCHC 34.8 12/10/2015 0705   MCHC 34.0 12/30/2006 0949   RDW 14.5 12/10/2015 0705   RDW 15.7* 12/30/2006 0949   LYMPHSABS 1.3 12/30/2006 0949   MONOABS 0.6 12/30/2006 0949   EOSABS 0.3 12/30/2006 0949   BASOSABS 0.0 12/30/2006 0949    BMET    Component Value Date/Time   NA 130* 12/10/2015 0705   K 3.6 12/10/2015 0705   CL 94* 12/10/2015 0705   CO2 23 12/10/2015 0705   GLUCOSE 129* 12/10/2015 0705   BUN 101* 12/10/2015 0705   CREATININE 8.11* 12/10/2015 0705   CALCIUM 8.5* 12/10/2015 0705   CALCIUM 8.6 10/29/2011 1000   GFRNONAA 6* 12/10/2015 0705   GFRAA 7* 12/10/2015 0705    COAGS: Lab Results  Component Value Date   INR 1.60* 12/07/2015   INR 1.53* 12/06/2015   INR 1.53* 12/05/2015     Non-Invasive Vascular Imaging:   Vein mapping pending  Statin:  Yes.   Beta Blocker:  No. Aspirin:  No. ACEI:  No. ARB:  No. Other antiplatelets/anticoagulants:  Yes.   coumadin   ASSESSMENT/PLAN: This is a 70 y.o. 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 The pt is right hand dominant.  -pt is right hand dominant and hopefully we can use his left arm for permanent access.  Vein mapping has been ordered -pt does have hx of DVT x 2, however pt is not on any anticoagulation due to his subscapular hemorrhage -will plan for surgery Thursday or Friday.  Will make definitive plans after vein mapping is done.     Leontine Locket, PA-C [facility] [phone]  History and exam findings as above.  Will place dialysis catheter and hopefully permanent access on Thursday or Friday.  Will get vein map tomorrow.  Will repeat INR to make sure it is continuing to trend down.  Subcapsular renal hematoma idea of MRI has been mentioned.  Since he will need heparin during his access placement probably would be good to have this information prior to his access to make sure he is not having worsening of bleed although pain seems to be improved and hemoglobin overall fairly stable.  Ruta Hinds, MD [facility] [place] Office: [phone] Pager: [phone]

## 2015-12-10 NOTE — Progress Notes (Signed)
[facility] PROGRESS NOTE  Albert Stanley [id number] DOB: 1946/09/17 DOA: 12/03/2015 PCP: [name], MD  Brief narrative 70 year old male with history of right kidney disease stage III, bladder cancer, diabetes mellitus, history of DVT on warfarin was admitted to [facility] long hospital when he presented with right flank pain and found to have right renal subcapsular hematoma. Hospital course prolonged due to progressive worsening of his renal function. Echo showing new systolic CHF. Transferred to [facility] with concern for need of hemodialysis.  Assessment/Plan: Acute on chronic kidney disease stage III Has good urine output but progressively worsening creatinine.on high dose IV lasix.   Nephrology recommends aVF placement with preparation for dialysis.  New onset acute systolic CHF with pulmonary edema and/or extremity edema EF of 35-40%. Appreciate cardiology recommendations. No  cardiac cath at this time and will reassess in future. Had stress testing 2011 which was low risk. On aggressive IV lasix. Still c/o orthopnea. Started on beta blocker. cannot use ACE inhibitor/ARB due to renal dysfunction.  Right renal subcapsular hematoma Right flank pain and supposedly improved. Hemoglobin low but stable. Hospitalist discussed with Dr. Suzanne Boron diarmid  with urology who recommended no surgical intervention needed. Anticoagulation has been held and INR reversed.  NSVT  stable. Continue telemetry monitoring. 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 No acute DVT on Doppler lower extremity. 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 Need to discuss with urology prior to discharge if antipronation can be restarted.  Complex lesion over right kidney Needs repeat CT or MRI in few months as outpatient.  Type 2 diabetes mellitus Continue sliding scale coverage. A1c of 5.8.  He is on oral hypoglycemics at home.  Anemia of chronic disease Hemoglobin low but stable. Received 1 unit PRBC on 2/26.  Essential hypertension Continue amlodipine. Stable.  Hypokalemia Replenish  DVT prophylaxis: SCDs Diet: Renal/diabetic   Code Status: Full code Family Communication: None at bedside Disposition Plan: Currently inpatient   Consultants:  Renal  Cardiology  Procedures:  2-D echo  Antibiotics:  None  HPI/Subjective: Seen and examined. Complains of orthopnea. Rt flank pain better  Objective: Filed Vitals:   12/10/15 0517 12/10/15 0900  BP: 128/71 127/78  Pulse: 84 75  Temp: 99.3 F (37.4 C) 98.7 F (37.1 C)  Resp:  19    Intake/Output Summary (Last 24 hours) at 12/10/15 1645 Last data filed at 12/10/15 1400  Gross per 24 hour  Intake   1142 ml  Output   1925 ml  Net   -783 ml   Filed Weights   12/08/15 0553 12/08/15 2142 12/10/15 0234  Weight: 113.082 kg (249 lb 4.8 oz) 110.224 kg (243 lb) 111.902 kg (246 lb 11.2 oz)    Exam:   General:  Elderly male not in distress  HEENT:  moist mucosa  Chest: diminished breath sounds  bilaterally  CVS: Normal S1 and S2, no murmurs rub or gallop  GI: Soft, nondistended, nontender, bowel sounds present, mild right CVA tenderness  Musculoskeletal: Warm,trace edema  Data Reviewed: Basic Metabolic Panel:  Recent Labs Lab 12/06/15 0458 12/07/15 0507 12/08/15 0503 12/09/15 0818 12/10/15 0705  NA 138 133* 133* 132* 130*  K 4.0 3.9 3.9 3.3* 3.6  CL 103 97* 95* 95* 94*  CO2 22 23 24 22 23   GLUCOSE 113* 143* 89 120* 129*  BUN 73* 84* 98* 99* 101*  CREATININE 6.59* 7.16* 7.51* 7.94*  8.11*  CALCIUM 9.0 8.7* 8.7* 8.5* 8.5*  MG  --   --   --   --  2.2   Liver Function Tests:  Recent Labs Lab 12/04/15 0022 12/05/15 0616  AST 28 26  ALT 18 16*  ALKPHOS 48 41  BILITOT 0.5 0.8  PROT 6.6 6.5  ALBUMIN 3.6 3.4*    Recent Labs Lab 12/04/15 0022  LIPASE 23   No results for  input(s): AMMONIA in the last 168 hours. CBC:  Recent Labs Lab 12/06/15 0458 12/07/15 0507 12/08/15 0503 12/09/15 0818 12/10/15 0705  WBC 9.5 9.1 8.4 7.9 7.3  HGB 7.1* 7.0* 6.8* 7.5* 8.0*  HCT 21.6* 20.9* 20.5* 21.3* 23.0*  MCV 86.7 86.0 86.5 82.9 83.0  PLT 165 196 194 196 224   Cardiac Enzymes: No results for input(s): CKTOTAL, CKMB, CKMBINDEX, TROPONINI in the last 168 hours. BNP (last 3 results)  Recent Labs  02/06/15 2204  BNP 53.4    ProBNP (last 3 results) No results for input(s): PROBNP in the last 8760 hours.  CBG:  Recent Labs Lab 12/09/15 0801 12/09/15 1228 12/09/15 1651 12/10/15 0626 12/10/15 1317  GLUCAP 124* 123* 137* 135* 162*    Recent Results (from the past 240 hour(s))  Urine culture     Status: None   Collection Time: 12/04/15  7:07 AM  Result Value Ref Range Status   Specimen Description URINE, CLEAN CATCH  Final   Special Requests NONE  Final   Culture   Final    NO GROWTH 1 DAY Performed at [facility]    Report Status 12/05/2015 FINAL  Final     Studies: No results found.  Scheduled Meds: . amLODipine  10 mg Oral Daily  . atorvastatin  40 mg Oral q1800  . calcitRIOL  0.5 mcg Oral Daily  . carvedilol  6.25 mg Oral BID WC  . darbepoetin (ARANESP) injection - NON-DIALYSIS  60 mcg Subcutaneous Q Mon-1800  . docusate sodium  100 mg Oral BID  . ferric gluconate (FERRLECIT/NULECIT) IV  125 mg Intravenous QODAY  . furosemide  120 mg Intravenous BID  . insulin aspart  0-9 Units Subcutaneous TID WC  . metolazone  10 mg Oral Daily  . potassium chloride  20 mEq Oral BID  . sodium chloride flush  3 mL Intravenous Q12H   Continuous Infusions: . sodium chloride Stopped (12/08/15 1802)      Time spent: 25 minutes    Jamise Pentland, [place]  [facility] Pager [phone]. If 7PM-7AM, please contact night-coverage at [email or web address], password [facility] 12/10/2015, 4:45 PM  LOS: 6 days

## 2015-12-10 NOTE — Care Management Note (Signed)
Case Management Note  Patient Details  Name: RICKY GALLERY MRN: [id number] Date of Birth: Aug 28, 1946  Subjective/Objective:       CM following for progression and d/c planning.             Action/Plan: 12/10/2015 Met with pt re EMMI study for CHF, he is agreeable with some reservations, have asked [facility] rep to assist this pt in understanding need for immediate followup for CHF.   Expected Discharge Date:   (UNKNOWN)               Expected Discharge Plan:  [place]  In-House Referral:  NA  Discharge planning Services  CM Consult  Post Acute Care Choice:  Home Health Choice offered to:     DME Arranged:    DME Agency:     HH Arranged:  RN [place] Agency:     Status of Service:  In process, will continue to follow  Medicare Important Message Given:  Yes Date Medicare IM Given:    Medicare IM give by:    Date Additional Medicare IM Given:    Additional Medicare Important Message give by:     If discussed at [place] of Stay Meetings, dates discussed:    Additional Comments:  Adron Bene, RN 12/10/2015, 11:13 AM

## 2015-12-10 NOTE — Progress Notes (Signed)
Patient had 5 runs of Vtach @04 :58, asymptomatic, no complaints of chest pain. Blood pressure=128/71, HR=84. NP on call notified. Will continue to monitor patient.

## 2015-12-11 ENCOUNTER — Ambulatory Visit (HOSPITAL_COMMUNITY): Payer: Medicare Other

## 2015-12-11 DIAGNOSIS — E119 Type 2 diabetes mellitus without complications: Secondary | ICD-10-CM

## 2015-12-11 DIAGNOSIS — I10 Essential (primary) hypertension: Secondary | ICD-10-CM

## 2015-12-11 DIAGNOSIS — N185 Chronic kidney disease, stage 5: Secondary | ICD-10-CM

## 2015-12-11 DIAGNOSIS — E118 Type 2 diabetes mellitus with unspecified complications: Secondary | ICD-10-CM

## 2015-12-11 DIAGNOSIS — I2510 Atherosclerotic heart disease of native coronary artery without angina pectoris: Secondary | ICD-10-CM

## 2015-12-11 LAB — CBC WITH DIFFERENTIAL/PLATELET
Basophils Absolute: 0 10*3/uL (ref 0.0–0.1)
Basophils Relative: 1 %
Eosinophils Absolute: 0.3 10*3/uL (ref 0.0–0.7)
Eosinophils Relative: 4 %
HEMATOCRIT: 23.1 % — AB (ref 39.0–52.0)
Hemoglobin: 7.5 g/dL — ABNORMAL LOW (ref 13.0–17.0)
LYMPHS ABS: 0.8 10*3/uL (ref 0.7–4.0)
LYMPHS PCT: 11 %
MCH: 27.1 pg (ref 26.0–34.0)
MCHC: 32.5 g/dL (ref 30.0–36.0)
MCV: 83.4 fL (ref 78.0–100.0)
MONO ABS: 1.3 10*3/uL — AB (ref 0.1–1.0)
MONOS PCT: 19 %
NEUTROS ABS: 4.7 10*3/uL (ref 1.7–7.7)
Neutrophils Relative %: 65 %
Platelets: 254 10*3/uL (ref 150–400)
RBC: 2.77 MIL/uL — ABNORMAL LOW (ref 4.22–5.81)
RDW: 14.1 % (ref 11.5–15.5)
WBC: 7.1 10*3/uL (ref 4.0–10.5)

## 2015-12-11 LAB — GLUCOSE, CAPILLARY
GLUCOSE-CAPILLARY: 156 mg/dL — AB (ref 65–99)
Glucose-Capillary: 120 mg/dL — ABNORMAL HIGH (ref 65–99)
Glucose-Capillary: 148 mg/dL — ABNORMAL HIGH (ref 65–99)
Glucose-Capillary: 157 mg/dL — ABNORMAL HIGH (ref 65–99)
Glucose-Capillary: 182 mg/dL — ABNORMAL HIGH (ref 65–99)

## 2015-12-11 LAB — RENAL FUNCTION PANEL
ANION GAP: 14 (ref 5–15)
Albumin: 2.7 g/dL — ABNORMAL LOW (ref 3.5–5.0)
BUN: 103 mg/dL — ABNORMAL HIGH (ref 6–20)
CO2: 23 mmol/L (ref 22–32)
Calcium: 8.5 mg/dL — ABNORMAL LOW (ref 8.9–10.3)
Chloride: 94 mmol/L — ABNORMAL LOW (ref 101–111)
Creatinine, Ser: 7.94 mg/dL — ABNORMAL HIGH (ref 0.61–1.24)
GFR calc Af Amer: 7 mL/min — ABNORMAL LOW (ref >60)
GFR calc non Af Amer: 6 mL/min — ABNORMAL LOW (ref >60)
GLUCOSE: 130 mg/dL — AB (ref 65–99)
POTASSIUM: 3.7 mmol/L (ref 3.5–5.1)
Phosphorus: 5.9 mg/dL — ABNORMAL HIGH (ref 2.5–4.6)
Sodium: 131 mmol/L — ABNORMAL LOW (ref 135–145)

## 2015-12-11 LAB — CBC
HCT: 22.3 % — ABNORMAL LOW (ref 39.0–52.0)
Hemoglobin: 7.5 g/dL — ABNORMAL LOW (ref 13.0–17.0)
MCH: 27.9 pg (ref 26.0–34.0)
MCHC: 33.6 g/dL (ref 30.0–36.0)
MCV: 82.9 fL (ref 78.0–100.0)
PLATELETS: 237 10*3/uL (ref 150–400)
RBC: 2.69 MIL/uL — AB (ref 4.22–5.81)
RDW: 14.3 % (ref 11.5–15.5)
WBC: 8.1 10*3/uL (ref 4.0–10.5)

## 2015-12-11 LAB — PROTIME-INR
INR: 1.45 (ref 0.00–1.49)
Prothrombin Time: 17.7 seconds — ABNORMAL HIGH (ref 11.6–15.2)

## 2015-12-11 NOTE — Progress Notes (Addendum)
[facility] Progress Note  Patient: Albert Stanley [id number]   PCP: [name], MD DOB: 1945/11/16   DOA: 12/03/2015   DOS: 12/11/2015   Date of Service: the patient was seen and examined on 12/11/2015  Subjective: Patient mentions that the pain in the right flank has returned today. Denies any nausea or vomiting. No diarrhea no constipation no active bleeding. Does not feel that he is urinating more than his baseline. No shortness of breath Nutrition: Tolerating oral diet Activity: Mostly bedridden Last BM: 12/10/2015  Assessment and Plan: 1. Renal hematoma, right The patient presents with complaints of right flank pain. Workup in the ER showed that he had developed right-sided hematoma. Urology was consulted and recommended to hold anticoagulation and continue to monitor the patient clinically. No acute surgical intervention required. Patient has recurrence of the pain on 12/11/2015, H&H remained stable. We'll continue to monitor clinically. If the pain is worse and patient will require repeat CT scan abdomen and pelvis and also may need the consultation with urology.  2. Acute on chronic kidney disease. Baseline creatinine 4, presentation 5.4 with volume overload and evidence of uremia. Patient was started on IV Lasix with minimal improvement in urine output. His BUN is resistant to the climbing and serum creatinine remaining stable with minimal urine output in last 24 hours. With this patient was discussed with vascular surgery for vein mapping and AV fistula to initiate potential hemodialysis. Nephrology continues to follow-up with the patient. Continue IV Lasix.  3. History of DVT with chronic anticoagulation. Presented with renal hematoma on the right side. H&H remains stable. INR has been reversed. 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 Given this factor patient is currently remaining off  anticoagulation.  4. Acute systolic CHF. Prior echocardiogram showed ejection fraction of 55-60%. This admission echo program were showing 35-40%. Regional wall motion abnormalities were also seen. Cardiology was consulted and the patient was considered a poor candidate for cardiac intervention due to his renal failure. Recommendation is to stabilize his renal function or follow-up with further cardiac workup as an outpatient after patient is on hemodialysis. He is continue on IV Lasix as well as Zaroxolyn. Started on Coreg, continue amlodipine  5. Normocytic anemia. H&H remaining 7.5.  Transfuse when necessary for hemoglobin less than 7  6. Type 2 diabetes mellitus. On Januvia at home. Currently here on sliding scale insulin. Sugars are better controlled. He will open A1c on 11/2015 5.8  DVT Prophylaxis: mechanical compression device. Nutrition: Renal diet Advance goals of care discussion: Full code  Brief Summary of Hospitalization:  HPI: As per the H and P dictated on admission, "ANDRIK MORETZ is a 70 y.o. male with a past medical history of chronic kidney disease unknown stage, history of bladder cancer, history of diabetes on oral agents, history of DVT on anticoagulation, who presented with complaint of pain in the right flank since yesterday evening at 6 PM. The pain was 10 out of 10 in intensity. It was sudden onset. No radiation of the pain. Was associated with vomiting. No precipitating aggravating or relieving factors. Denies any fever or chills. After getting pain medications, pain is better and down to 6 out of 10 in intensity. Denies any lightheadedness. No previous episodes of same. Denies any blood in the urine. No pain with urination.  In the emergency department he was found to have a right renal subcapsular hematoma with suspicion for bleeding into a  renal cyst. He will be hospitalized for further management." Procedures: Echocardiogram Consultants: Nephrology,  vascular surgery, cardiology Antibiotics: Anti-infectives    None      Family Communication: no family was present at bedside, at the time of interview.   Disposition:  Barriers to safe discharge: Improvement in renal function   Intake/Output Summary (Last 24 hours) at 12/11/15 1445 Last data filed at 12/11/15 1317  Gross per 24 hour  Intake    724 ml  Output    800 ml  Net    -76 ml   Filed Weights   12/10/15 0234 12/10/15 2156 12/11/15 0500  Weight: 111.902 kg (246 lb 11.2 oz) 112.2 kg (247 lb 5.7 oz) 112.2 kg (247 lb 5.7 oz)    Objective: Physical Exam: Filed Vitals:   12/10/15 2156 12/11/15 0447 12/11/15 0500 12/11/15 0800  BP: 132/85 143/88  135/79  Pulse: 78 76  79  Temp: 98 F (36.7 C) 98.4 F (36.9 C)  98.4 F (36.9 C)  TempSrc: Oral Oral  Oral  Resp: 18 19  20   Height:      Weight: 112.2 kg (247 lb 5.7 oz)  112.2 kg (247 lb 5.7 oz)   SpO2: 98% 98%  98%     General: Appear in mild distress, no Rash; Oral Mucosa moist. Cardiovascular: S1 and S2 Present, no Murmur, no JVD Respiratory: Bilateral Air entry present and Clear to Auscultation, no Crackles, no wheezes Abdomen: Bowel Sound present, Soft and right flank tenderness Extremities: no Pedal edema, no calf tenderness Neurology: Grossly no focal neuro deficit.  Data Reviewed: CBC:  Recent Labs Lab 12/08/15 0503 12/09/15 0818 12/10/15 0705 12/11/15 0825 12/11/15 1156  WBC 8.4 7.9 7.3 7.1 8.1  NEUTROABS  --   --   --  4.7  --   HGB 6.8* 7.5* 8.0* 7.5* 7.5*  HCT 20.5* 21.3* 23.0* 23.1* 22.3*  MCV 86.5 82.9 83.0 83.4 82.9  PLT 194 196 224 254 [id number]   Basic Metabolic Panel:  Recent Labs Lab 12/07/15 0507 12/08/15 0503 12/09/15 0818 12/10/15 0705 12/11/15 0825  NA 133* 133* 132* 130* 131*  K 3.9 3.9 3.3* 3.6 3.7  CL 97* 95* 95* 94* 94*  CO2 23 24 22 23 23   GLUCOSE 143* 89 120* 129* 130*  BUN 84* 98* 99* 101* 103*  CREATININE 7.16* 7.51* 7.94* 8.11* 7.94*  CALCIUM 8.7* 8.7* 8.5* 8.5*  8.5*  MG  --   --   --  2.2  --   PHOS  --   --   --   --  5.9*   Liver Function Tests:  Recent Labs Lab 12/05/15 0616 12/11/15 0825  AST 26  --   ALT 16*  --   ALKPHOS 41  --   BILITOT 0.8  --   PROT 6.5  --   ALBUMIN 3.4* 2.7*   No results for input(s): LIPASE, AMYLASE in the last 168 hours. No results for input(s): AMMONIA in the last 168 hours.  Cardiac Enzymes: No results for input(s): CKTOTAL, CKMB, CKMBINDEX, TROPONINI in the last 168 hours.  BNP (last 3 results)  Recent Labs  02/06/15 2204  BNP 53.4    CBG:  Recent Labs Lab 12/10/15 1317 12/10/15 1706 12/10/15 2052 12/11/15 0823 12/11/15 1207  GLUCAP 162* 159* 141* 120* 157*    Recent Results (from the past 240 hour(s))  Urine culture     Status: None   Collection Time: 12/04/15  7:07 AM  Result Value Ref  Range Status   Specimen Description URINE, CLEAN CATCH  Final   Special Requests NONE  Final   Culture   Final    NO GROWTH 1 DAY Performed at [facility]    Report Status 12/05/2015 FINAL  Final     Studies: No results found.   Scheduled Meds: . amLODipine  10 mg Oral Daily  . atorvastatin  40 mg Oral q1800  . calcitRIOL  0.5 mcg Oral Daily  . carvedilol  6.25 mg Oral BID WC  . darbepoetin (ARANESP) injection - NON-DIALYSIS  60 mcg Subcutaneous Q Mon-1800  . docusate sodium  100 mg Oral BID  . ferric gluconate (FERRLECIT/NULECIT) IV  125 mg Intravenous QODAY  . furosemide  120 mg Intravenous BID  . insulin aspart  0-9 Units Subcutaneous TID WC  . metolazone  10 mg Oral Daily  . potassium chloride  20 mEq Oral BID  . sodium chloride flush  3 mL Intravenous Q12H   Continuous Infusions: . sodium chloride Stopped (12/08/15 1802)   PRN Meds: acetaminophen **OR** acetaminophen, albuterol, alum & mag hydroxide-simeth, bisacodyl, cyclobenzaprine, famotidine, HYDROmorphone (DILAUDID) injection, ondansetron **OR** ondansetron (ZOFRAN) IV, oxyCODONE  Time spent: 30  minutes  Author: Berle Mull, MD [facility] Pager: [phone] 12/11/2015 2:45 PM  If 7PM-7AM, please contact night-coverage at [email or web address], password [facility]

## 2015-12-11 NOTE — Progress Notes (Signed)
Right  Upper Extremity Vein Map    Cephalic  Segment Diameter Depth Comment  1. Axilla 4.8 mm 5.7 mm   2. Mid upper arm 5.5 mm 2.7 mm   3. Above AC 4.3 mm 2.7 mm   4. In AC 7.4 mm 3.2 mm   5. Below AC 3.7 mm 3.2 mm   6. Mid forearm 2.9 mm 2.6 mm   7. Wrist 2.2 mm 1.6 mm    mm mm    mm mm    mm mm    Basilic  Segment Diameter Depth Comment  2. Mid upper arm 4.4 mm 8.7 mm   3. Above AC 4.7 mm 6.6 mm   4. In AC 4.4 mm 5.5 mm   5. Below AC 1.9 mm 3.0 mm   6. Mid forearm 4.2 mm 4.0 mm   7. Wrist 4.2 mm 4.0 mm    mm mm    mm mm    mm mm   Left Upper Extremity Vein Map    Cephalic  Segment Diameter Depth Comment  1. Axilla 3.6 mm 7.5 mm   2. Mid upper arm 2.8 mm 5.3 mm   3. Above AC 3.7 mm 2.3 mm   4. In AC 5.8 mm 2.0 mm   5. Below AC 2.0 mm 3.9 mm   6. Mid forearm 2.4 mm 2.9 mm   7. Wrist 2.6 mm 2.8 mm    mm mm    mm mm    mm mm    Basilic  Segment Diameter Depth Comment  2. Mid upper arm 4.7 mm 7.8 mm   3. Above AC 3.5 mm 9.3 mm   4. In AC 3.8 mm 4.5 mm   5. Below AC 3.7 mm 4.2 mm   6. Mid forearm 1.4 mm 2.4 mm   7. Wrist 1.5 mm 2.0 mm    mm mm    mm mm    mm mm

## 2015-12-11 NOTE — Consult Note (Signed)
Awaiting results of vein mapping.  Tentatively permanent access and tunneled catheter on Friday.  Ruta Hinds, MD [facility] Office: [phone] Pager: [phone]

## 2015-12-11 NOTE — Progress Notes (Signed)
[facility] ROUNDING NOTE   Subjective:   Interval History: no complaints this morning  Objective:  Vital signs in last 24 hours:  Temp:  [98 F (36.7 C)-98.4 F (36.9 C)] 98.4 F (36.9 C) (03/01 0800) Pulse Rate:  [74-79] 79 (03/01 0800) Resp:  [18-20] 20 (03/01 0800) BP: (123-143)/(79-88) 135/79 mmHg (03/01 0800) SpO2:  [95 %-98 %] 98 % (03/01 0800) Weight:  [112.2 kg (247 lb 5.7 oz)] 112.2 kg (247 lb 5.7 oz) (03/01 0500)  Weight change: 0.298 kg (10.5 oz) Filed Weights   12/10/15 0234 12/10/15 2156 12/11/15 0500  Weight: 111.902 kg (246 lb 11.2 oz) 112.2 kg (247 lb 5.7 oz) 112.2 kg (247 lb 5.7 oz)    Intake/Output: I/O last 3 completed shifts: In: 1386 [P.O.:1200; IV Piggyback:186] Out: [id number] [Urine:1650]   Intake/Output this shift:  Total I/O In: 240 [P.O.:240] Out: 300 [Urine:300]  CVS- RRR RS- CTA ABD- BS present soft non-distended EXT- no edema   Basic Metabolic Panel:  Recent Labs Lab 12/07/15 0507 12/08/15 0503 12/09/15 0818 12/10/15 0705 12/11/15 0825  NA 133* 133* 132* 130* 131*  K 3.9 3.9 3.3* 3.6 3.7  CL 97* 95* 95* 94* 94*  CO2 23 24 22 23 23   GLUCOSE 143* 89 120* 129* 130*  BUN 84* 98* 99* 101* 103*  CREATININE 7.16* 7.51* 7.94* 8.11* 7.94*  CALCIUM 8.7* 8.7* 8.5* 8.5* 8.5*  MG  --   --   --  2.2  --   PHOS  --   --   --   --  5.9*    Liver Function Tests:  Recent Labs Lab 12/05/15 0616 12/11/15 0825  AST 26  --   ALT 16*  --   ALKPHOS 41  --   BILITOT 0.8  --   PROT 6.5  --   ALBUMIN 3.4* 2.7*   No results for input(s): LIPASE, AMYLASE in the last 168 hours. No results for input(s): AMMONIA in the last 168 hours.  CBC:  Recent Labs Lab 12/07/15 0507 12/08/15 0503 12/09/15 0818 12/10/15 0705 12/11/15 0825  WBC 9.1 8.4 7.9 7.3 7.1  NEUTROABS  --   --   --   --  4.7  HGB 7.0* 6.8* 7.5* 8.0* 7.5*  HCT 20.9* 20.5* 21.3* 23.0* 23.1*  MCV 86.0 86.5 82.9 83.0 83.4  PLT 196 194 196 224 254    Cardiac  Enzymes: No results for input(s): CKTOTAL, CKMB, CKMBINDEX, TROPONINI in the last 168 hours.  BNP: Invalid input(s): POCBNP  CBG:  Recent Labs Lab 12/10/15 0626 12/10/15 1317 12/10/15 1706 12/10/15 2052 12/11/15 0823  GLUCAP 135* 162* 159* 141* 120*    Microbiology: Results for orders placed or performed during the hospital encounter of 12/03/15  Urine culture     Status: None   Collection Time: 12/04/15  7:07 AM  Result Value Ref Range Status   Specimen Description URINE, CLEAN CATCH  Final   Special Requests NONE  Final   Culture   Final    NO GROWTH 1 DAY Performed at [facility]    Report Status 12/05/2015 FINAL  Final    Coagulation Studies:  Recent Labs  12/11/15 0647  LABPROT 17.7*  INR 1.45    Urinalysis: No results for input(s): COLORURINE, LABSPEC, PHURINE, GLUCOSEU, HGBUR, BILIRUBINUR, KETONESUR, PROTEINUR, UROBILINOGEN, NITRITE, LEUKOCYTESUR in the last 72 hours.  Invalid input(s): APPERANCEUR    Imaging: No results found.   Medications:   . sodium chloride Stopped (12/08/15 1802)   .  amLODipine  10 mg Oral Daily  . atorvastatin  40 mg Oral q1800  . calcitRIOL  0.5 mcg Oral Daily  . carvedilol  6.25 mg Oral BID WC  . darbepoetin (ARANESP) injection - NON-DIALYSIS  60 mcg Subcutaneous Q Mon-1800  . docusate sodium  100 mg Oral BID  . ferric gluconate (FERRLECIT/NULECIT) IV  125 mg Intravenous QODAY  . furosemide  120 mg Intravenous BID  . insulin aspart  0-9 Units Subcutaneous TID WC  . metolazone  10 mg Oral Daily  . potassium chloride  20 mEq Oral BID  . 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 Creat 7 recommend dialysis preparation with AVF placement  2  Pulm edema / LE edema - better No lasix at present will restart  3 R flank pain/ subcapsular hematoma - rec'd vit K and Kcentra , per primary  4 HTN on norvasc/ coreg, bp's good 5 Anemia of CKD- Hb 7.1, check anemia panel and stool guiac. Low tsat , ordered IV iron load 6 Hx bladder cancer 7 Renal cysts, some complex lesions - needs MRI when more stable 8 Systolic CHF - no cath for now per cardiology; reassess when on dialysis. 9 Constipated - dulcolax ordered  Appreciated Dr Oneida Alar input    LOS: 7 Saprina Chuong W @TODAY @11 :53 AM

## 2015-12-12 LAB — RENAL FUNCTION PANEL
ALBUMIN: 2.8 g/dL — AB (ref 3.5–5.0)
Anion gap: 13 (ref 5–15)
BUN: 103 mg/dL — ABNORMAL HIGH (ref 6–20)
CALCIUM: 8.7 mg/dL — AB (ref 8.9–10.3)
CO2: 25 mmol/L (ref 22–32)
Chloride: 91 mmol/L — ABNORMAL LOW (ref 101–111)
Creatinine, Ser: 7.76 mg/dL — ABNORMAL HIGH (ref 0.61–1.24)
GFR calc Af Amer: 7 mL/min — ABNORMAL LOW (ref >60)
GFR, EST NON AFRICAN AMERICAN: 6 mL/min — AB (ref >60)
Glucose, Bld: 153 mg/dL — ABNORMAL HIGH (ref 65–99)
PHOSPHORUS: 5.9 mg/dL — AB (ref 2.5–4.6)
POTASSIUM: 4.2 mmol/L (ref 3.5–5.1)
SODIUM: 129 mmol/L — AB (ref 135–145)

## 2015-12-12 LAB — CBC WITH DIFFERENTIAL/PLATELET
BASOS ABS: 0 10*3/uL (ref 0.0–0.1)
BASOS PCT: 0 %
EOS ABS: 0.3 10*3/uL (ref 0.0–0.7)
Eosinophils Relative: 4 %
HEMATOCRIT: 23.3 % — AB (ref 39.0–52.0)
HEMOGLOBIN: 7.7 g/dL — AB (ref 13.0–17.0)
Lymphocytes Relative: 10 %
Lymphs Abs: 0.8 10*3/uL (ref 0.7–4.0)
MCH: 27.7 pg (ref 26.0–34.0)
MCHC: 33 g/dL (ref 30.0–36.0)
MCV: 83.8 fL (ref 78.0–100.0)
MONO ABS: 1.5 10*3/uL — AB (ref 0.1–1.0)
MONOS PCT: 19 %
NEUTROS PCT: 65 %
Neutro Abs: 4.9 10*3/uL (ref 1.7–7.7)
Platelets: 259 10*3/uL (ref 150–400)
RBC: 2.78 MIL/uL — ABNORMAL LOW (ref 4.22–5.81)
RDW: 14.2 % (ref 11.5–15.5)
WBC: 7.6 10*3/uL (ref 4.0–10.5)

## 2015-12-12 LAB — GLUCOSE, CAPILLARY
GLUCOSE-CAPILLARY: 194 mg/dL — AB (ref 65–99)
GLUCOSE-CAPILLARY: 174 mg/dL — AB (ref 65–99)
GLUCOSE-CAPILLARY: 191 mg/dL — AB (ref 65–99)
Glucose-Capillary: 159 mg/dL — ABNORMAL HIGH (ref 65–99)

## 2015-12-12 LAB — MAGNESIUM: MAGNESIUM: 2.3 mg/dL (ref 1.7–2.4)

## 2015-12-12 MED ORDER — DOCUSATE SODIUM 100 MG PO CAPS: 200.0000 mg | ORAL_CAPSULE | Freq: Two times a day (BID) | ORAL | Status: DC

## 2015-12-12 MED ORDER — POLYETHYLENE GLYCOL 3350 17 G PO PACK
17.0000 g | PACK | Freq: Every day | ORAL | Status: DC
  Administered 2015-12-17: 17 g via ORAL
  Filled 2015-12-12 (×2): qty 1

## 2015-12-12 MED ORDER — VANCOMYCIN HCL 1000 MG IV SOLR
1000.0000 mg | INTRAVENOUS | Status: AC
  Filled 2015-12-12: qty 1000

## 2015-12-12 MED ORDER — SENNOSIDES-DOCUSATE SODIUM 8.6-50 MG PO TABS
1.0000 | ORAL_TABLET | Freq: Two times a day (BID) | ORAL | Status: DC
  Administered 2015-12-15 – 2015-12-17 (×3): 1 via ORAL
  Filled 2015-12-12 (×7): qty 1

## 2015-12-12 MED ORDER — BISACODYL 5 MG PO TBEC
10.0000 mg | DELAYED_RELEASE_TABLET | Freq: Every day | ORAL | Status: DC
Start: 2015-12-12 — End: 2015-12-18
  Administered 2015-12-12 – 2015-12-18 (×5): 10 mg via ORAL
  Filled 2015-12-12 (×7): qty 2

## 2015-12-12 NOTE — Progress Notes (Signed)
[facility] Progress Note  Patient: Albert Stanley [id number]   PCP: [name], MD DOB: 12-17-1945   DOA: 12/03/2015   DOS: 12/12/2015   Date of Service: the patient was seen and examined on 12/12/2015  Subjective: Patient had pain in her right flank last night. No pain at the time of my evaluation. Nausea and vomiting and diarrhea. Patient repeatedly asking regarding the plan in the hospital despite ample explanation.  Nutrition: Tolerating oral diet Activity: Mostly bedridden Last BM: 12/10/2015  Assessment and Plan: 1. Renal hematoma, right The patient presents with complaints of right flank pain. Workup in the ER showed that he had developed right-sided hematoma. Urology was consulted and recommended to hold anticoagulation and continue to monitor the patient clinically. No acute surgical intervention required. H&H remained stable. We'll continue to monitor clinically. Pain control as needed  2. Acute on chronic kidney disease. Baseline creatinine 4, presentation 5.4 with volume overload and evidence of uremia. Patient was started on IV Lasix with minimal improvement in urine output. His BUN is consistently climbing and serum creatinine remaining stable with minimal urine output in last 24 hours. With this patient was discussed with vascular surgery for vein mapping and AV fistula to initiate potential hemodialysis. Nephrology continues to follow-up with the patient. Continue IV Lasix.  3. History of DVT with chronic anticoagulation. Presented with renal hematoma on the right side. H&H remains stable. INR has been reversed. 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 Given this factor patient is currently remaining off anticoagulation.  4. Acute systolic CHF. Prior echocardiogram showed ejection fraction of 55-60%. This admission echo program were showing 35-40%. Regional wall motion abnormalities were  also seen. Cardiology was consulted and the patient was considered a poor candidate for cardiac intervention due to his renal failure. Recommendation is to stabilize his renal function or follow-up with further cardiac workup as an outpatient after patient is on hemodialysis. He is continue on IV Lasix as well as Zaroxolyn. Started on Coreg, continue amlodipine  5. Normocytic anemia. H&H remaining 7.5.  Transfuse when necessary for hemoglobin less than 7  6. Type 2 diabetes mellitus. On Januvia at home. Currently here on sliding scale insulin. Sugars are better controlled. He will open A1c on 11/2015 5.8  DVT Prophylaxis: mechanical compression device. Nutrition: Renal diet Advance goals of care discussion: Full code  Brief Summary of Hospitalization:  HPI: As per the H and P dictated on admission, "Albert Stanley is a 70 y.o. male with a past medical history of chronic kidney disease unknown stage, history of bladder cancer, history of diabetes on oral agents, history of DVT on anticoagulation, who presented with complaint of pain in the right flank since yesterday evening at 6 PM. The pain was 10 out of 10 in intensity. It was sudden onset. No radiation of the pain. Was associated with vomiting. No precipitating aggravating or relieving factors. Denies any fever or chills. After getting pain medications, pain is better and down to 6 out of 10 in intensity. Denies any lightheadedness. No previous episodes of same. Denies any blood in the urine. No pain with urination.  In the emergency department he was found to have a right renal subcapsular hematoma with suspicion for bleeding into a renal cyst. He will be hospitalized for further management." Procedures: Echocardiogram Consultants: Nephrology, vascular surgery, cardiology Antibiotics: Anti-infectives    Start     Dose/Rate Route Frequency Ordered Stop  12/13/15 0715  vancomycin (VANCOCIN) powder 1,000 mg     1,000 mg Other To  Surgery 12/12/15 1040 12/14/15 0715      Family Communication: no family was present at bedside, at the time of interview.   Disposition:  Barriers to safe discharge: Improvement in renal function   Intake/Output Summary (Last 24 hours) at 12/12/15 1608 Last data filed at 12/12/15 0910  Gross per 24 hour  Intake    964 ml  Output   1051 ml  Net    -87 ml   Filed Weights   12/10/15 2156 12/11/15 0500 12/11/15 2042  Weight: 112.2 kg (247 lb 5.7 oz) 112.2 kg (247 lb 5.7 oz) 112.4 kg (247 lb 12.8 oz)    Objective: Physical Exam: Filed Vitals:   12/11/15 0800 12/11/15 2042 12/12/15 0619 12/12/15 0900  BP: 135/79 125/87 138/92 135/90  Pulse: 79 51 79 76  Temp: 98.4 F (36.9 C) 98.5 F (36.9 C) 98.8 F (37.1 C) 98.7 F (37.1 C)  TempSrc: Oral Oral Oral Oral  Resp: 20 18 18 18   Height:      Weight:  112.4 kg (247 lb 12.8 oz)    SpO2: 98% 96% 96% 98%    General: Appear in mild distress, no Rash; Oral Mucosa moist. Cardiovascular: S1 and S2 Present, no Murmur, no JVD Respiratory: Bilateral Air entry present and Clear to Auscultation, no Crackles, no wheezes Abdomen: Bowel Sound present, Soft and no right flank tenderness Extremities: no Pedal edema, no calf tenderness Neurology: Grossly no focal neuro deficit.  Data Reviewed: CBC:  Recent Labs Lab 12/09/15 0818 12/10/15 0705 12/11/15 0825 12/11/15 1156 12/12/15 0754  WBC 7.9 7.3 7.1 8.1 7.6  NEUTROABS  --   --  4.7  --  4.9  HGB 7.5* 8.0* 7.5* 7.5* 7.7*  HCT 21.3* 23.0* 23.1* 22.3* 23.3*  MCV 82.9 83.0 83.4 82.9 83.8  PLT 196 224 254 237 [id number]   Basic Metabolic Panel:  Recent Labs Lab 12/08/15 0503 12/09/15 0818 12/10/15 0705 12/11/15 0825 12/12/15 0754  NA 133* 132* 130* 131* 129*  K 3.9 3.3* 3.6 3.7 4.2  CL 95* 95* 94* 94* 91*  CO2 24 22 23 23 25   GLUCOSE 89 120* 129* 130* 153*  BUN 98* 99* 101* 103* 103*  CREATININE 7.51* 7.94* 8.11* 7.94* 7.76*  CALCIUM 8.7* 8.5* 8.5* 8.5* 8.7*  MG  --   --  2.2   --  2.3  PHOS  --   --   --  5.9* 5.9*   Liver Function Tests:  Recent Labs Lab 12/11/15 0825 12/12/15 0754  ALBUMIN 2.7* 2.8*   No results for input(s): LIPASE, AMYLASE in the last 168 hours. No results for input(s): AMMONIA in the last 168 hours.  Cardiac Enzymes: No results for input(s): CKTOTAL, CKMB, CKMBINDEX, TROPONINI in the last 168 hours.  BNP (last 3 results)  Recent Labs  02/06/15 2204  BNP 53.4    CBG:  Recent Labs Lab 12/11/15 1638 12/11/15 2041 12/11/15 2358 12/12/15 0803 12/12/15 1148  GLUCAP 156* 182* 148* 159* 194*    Recent Results (from the past 240 hour(s))  Urine culture     Status: None   Collection Time: 12/04/15  7:07 AM  Result Value Ref Range Status   Specimen Description URINE, CLEAN CATCH  Final   Special Requests NONE  Final   Culture   Final    NO GROWTH 1 DAY Performed at [facility]    Report  Status 12/05/2015 FINAL  Final     Studies: No results found.   Scheduled Meds: . amLODipine  10 mg Oral Daily  . atorvastatin  40 mg Oral q1800  . calcitRIOL  0.5 mcg Oral Daily  . carvedilol  6.25 mg Oral BID WC  . darbepoetin (ARANESP) injection - NON-DIALYSIS  60 mcg Subcutaneous Q Mon-1800  . ferric gluconate (FERRLECIT/NULECIT) IV  125 mg Intravenous QODAY  . furosemide  120 mg Intravenous BID  . insulin aspart  0-9 Units Subcutaneous TID WC  . metolazone  10 mg Oral Daily  . polyethylene glycol  17 g Oral Daily  . potassium chloride  20 mEq Oral BID  . senna-docusate  1 tablet Oral BID  . sodium chloride flush  3 mL Intravenous Q12H  . [START ON 12/13/2015] vancomycin  1,000 mg Other To OR   Continuous Infusions: . sodium chloride Stopped (12/08/15 1802)   PRN Meds: acetaminophen **OR** acetaminophen, albuterol, alum & mag hydroxide-simeth, bisacodyl, cyclobenzaprine, famotidine, HYDROmorphone (DILAUDID) injection, ondansetron **OR** ondansetron (ZOFRAN) IV, oxyCODONE  Time spent: 30 minutes  Author: Berle Mull, MD [facility] Pager: [phone] 12/12/2015 4:08 PM  If 7PM-7AM, please contact night-coverage at [email or web address], password [facility]

## 2015-12-12 NOTE — Consult Note (Addendum)
[facility]  Subjective  - some right flank pain last night, now better  Objective 138/92 79 98.8 F (37.1 C) (Oral) 18 96%  Intake/Output Summary (Last 24 hours) at 12/12/15 1023 Last data filed at 12/12/15 0600  Gross per 24 hour  Intake    724 ml  Output   1200 ml  Net   -476 ml   DATA vein map reviewed upper arm cephalic seems good quality bilaterally  Assessment/Planning: Left brachial cephalic AVF and tunneled catheter tomorrow by my partner Dr Scot Dock.  Procedure details risk benefit discussed.  NPO p midnight Consent Antibiotics on call  Ruta Hinds 12/12/2015 10:23 AM --  Laboratory Lab Results:  Recent Labs  12/11/15 1156 12/12/15 0754  WBC 8.1 7.6  HGB 7.5* 7.7*  HCT 22.3* 23.3*  PLT 237 259   BMET  Recent Labs  12/11/15 0825 12/12/15 0754  NA 131* 129*  K 3.7 4.2  CL 94* 91*  CO2 23 25  GLUCOSE 130* 153*  BUN 103* 103*  CREATININE 7.94* 7.76*  CALCIUM 8.5* 8.7*    COAG Lab Results  Component Value Date   INR 1.45 12/11/2015   INR 1.60* 12/07/2015   INR 1.53* 12/06/2015   No results found for: PTT

## 2015-12-12 NOTE — Progress Notes (Signed)
[facility] ROUNDING NOTE   Subjective:   Interval History: still right flank pain  Objective:  Vital signs in last 24 hours:  Temp:  [98.5 F (36.9 C)-98.8 F (37.1 C)] 98.8 F (37.1 C) (03/02 0619) Pulse Rate:  [51-79] 79 (03/02 0619) Resp:  [18] 18 (03/02 0619) BP: (125-138)/(87-92) 138/92 mmHg (03/02 0619) SpO2:  [96 %] 96 % (03/02 0619) Weight:  [112.4 kg (247 lb 12.8 oz)] 112.4 kg (247 lb 12.8 oz) (03/01 2042)  Weight change: 0.2 kg (7.1 oz) Filed Weights   12/10/15 2156 12/11/15 0500 12/11/15 2042  Weight: 112.2 kg (247 lb 5.7 oz) 112.2 kg (247 lb 5.7 oz) 112.4 kg (247 lb 12.8 oz)    Intake/Output: I/O last 3 completed shifts: In: [id number] [P.O.:1080; IV Piggyback:248] Out: 1500 [Urine:1500]   Intake/Output this shift:     CVS- RRR RS- CTA ABD- BS present soft non-distended EXT-  3 +  edema   Basic Metabolic Panel:  Recent Labs Lab 12/07/15 0507 12/08/15 0503 12/09/15 0818 12/10/15 0705 12/11/15 0825  NA 133* 133* 132* 130* 131*  K 3.9 3.9 3.3* 3.6 3.7  CL 97* 95* 95* 94* 94*  CO2 23 24 22 23 23   GLUCOSE 143* 89 120* 129* 130*  BUN 84* 98* 99* 101* 103*  CREATININE 7.16* 7.51* 7.94* 8.11* 7.94*  CALCIUM 8.7* 8.7* 8.5* 8.5* 8.5*  MG  --   --   --  2.2  --   PHOS  --   --   --   --  5.9*    Liver Function Tests:  Recent Labs Lab 12/11/15 0825  ALBUMIN 2.7*   No results for input(s): LIPASE, AMYLASE in the last 168 hours. No results for input(s): AMMONIA in the last 168 hours.  CBC:  Recent Labs Lab 12/09/15 0818 12/10/15 0705 12/11/15 0825 12/11/15 1156 12/12/15 0754  WBC 7.9 7.3 7.1 8.1 7.6  NEUTROABS  --   --  4.7  --  4.9  HGB 7.5* 8.0* 7.5* 7.5* 7.7*  HCT 21.3* 23.0* 23.1* 22.3* 23.3*  MCV 82.9 83.0 83.4 82.9 83.8  PLT 196 224 254 237 259    Cardiac Enzymes: No results for input(s): CKTOTAL, CKMB, CKMBINDEX, TROPONINI in the last 168 hours.  BNP: Invalid input(s): POCBNP  CBG:  Recent Labs Lab  12/11/15 0823 12/11/15 1207 12/11/15 1638 12/11/15 2041 12/11/15 2358  GLUCAP 120* 157* 156* 182* 148*    Microbiology: Results for orders placed or performed during the hospital encounter of 12/03/15  Urine culture     Status: None   Collection Time: 12/04/15  7:07 AM  Result Value Ref Range Status   Specimen Description URINE, CLEAN CATCH  Final   Special Requests NONE  Final   Culture   Final    NO GROWTH 1 DAY Performed at [facility]    Report Status 12/05/2015 FINAL  Final    Coagulation Studies:  Recent Labs  12/11/15 0647  LABPROT 17.7*  INR 1.45    Urinalysis: No results for input(s): COLORURINE, LABSPEC, PHURINE, GLUCOSEU, HGBUR, BILIRUBINUR, KETONESUR, PROTEINUR, UROBILINOGEN, NITRITE, LEUKOCYTESUR in the last 72 hours.  Invalid input(s): APPERANCEUR    Imaging: No results found.   Medications:   . sodium chloride Stopped (12/08/15 1802)   . amLODipine  10 mg Oral Daily  . atorvastatin  40 mg Oral q1800  . calcitRIOL  0.5 mcg Oral Daily  . carvedilol  6.25 mg Oral BID WC  . darbepoetin (ARANESP) injection -  NON-DIALYSIS  60 mcg Subcutaneous Q Mon-1800  . docusate sodium  100 mg Oral BID  . ferric gluconate (FERRLECIT/NULECIT) IV  125 mg Intravenous QODAY  . furosemide  120 mg Intravenous BID  . insulin aspart  0-9 Units Subcutaneous TID WC  . metolazone  10 mg Oral Daily  . potassium chloride  20 mEq Oral BID  . 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 Creat 7 recommend dialysis preparation with AVF placement  2 Pulm edema / LE edema - better  IV lasix and metolazone 3 R flank pain/ subcapsular hematoma - reversed anticoagulation  4 HTN on norvasc/ coreg, bp's  good 5 Anemia of CKD- Hb 7.1,  Stable now with aranesp 6 Hx bladder cancer 7 Renal cysts, some complex lesions - needs MRI when more stable 8 Systolic CHF - no cath for now per cardiology; reassess   9 Constipated - dulcolax ordered 10.DVT - anticoagulation reversed  Vascular access planned in AM    LOS: 8 Paulmichael Schreck W @TODAY @8 :32 AM

## 2015-12-12 NOTE — Care Management Important Message (Signed)
Important Message  Patient Details  Name: Albert Stanley MRN: [id number] Date of Birth: [birth year]   Medicare Important Message Given:  Yes    Barb Merino Jourdan Durbin 12/12/2015, 2:18 PM

## 2015-12-12 NOTE — Progress Notes (Signed)
After,the  Iv team ,placed a new iv site on patient's right forearm,patient asked the iv team to have her iv pain medicine.Nurse went into patient's room,reminded him that iv dilaudid was given to him.Patient said' i did not feel it'' Nurse said i give you earlier before iv.team come,and gave to you with normal saline going on ,right before i started the the iron .Patient insisted that he didn't get it.Nurse showed to him the empty ampule and the empty syringe.I showed him too that scanned i.v pain medicine which occurred  exactly at 1511.

## 2015-12-13 ENCOUNTER — Inpatient Hospital Stay (HOSPITAL_COMMUNITY): Payer: Medicare Other

## 2015-12-13 ENCOUNTER — Inpatient Hospital Stay (HOSPITAL_COMMUNITY): Payer: Medicare Other | Admitting: Anesthesiology

## 2015-12-13 ENCOUNTER — Other Ambulatory Visit: Payer: Self-pay | Admitting: *Deleted

## 2015-12-13 ENCOUNTER — Encounter (HOSPITAL_COMMUNITY)
Admission: EM | Disposition: A | Discharge status: Home / Self Care | Payer: Self-pay | Source: Home / Self Care | Attending: Internal Medicine

## 2015-12-13 DIAGNOSIS — N185 Chronic kidney disease, stage 5: Secondary | ICD-10-CM

## 2015-12-13 DIAGNOSIS — Z4931 Encounter for adequacy testing for hemodialysis: Secondary | ICD-10-CM

## 2015-12-13 DIAGNOSIS — N186 End stage renal disease: Secondary | ICD-10-CM

## 2015-12-13 HISTORY — PX: AV FISTULA PLACEMENT: SHX1204

## 2015-12-13 HISTORY — PX: INSERTION OF DIALYSIS CATHETER: SHX1324

## 2015-12-13 LAB — GLUCOSE, CAPILLARY
GLUCOSE-CAPILLARY: 131 mg/dL — AB (ref 65–99)
GLUCOSE-CAPILLARY: 140 mg/dL — AB (ref 65–99)
GLUCOSE-CAPILLARY: 237 mg/dL — AB (ref 65–99)
Glucose-Capillary: 174 mg/dL — ABNORMAL HIGH (ref 65–99)
Glucose-Capillary: 268 mg/dL — ABNORMAL HIGH (ref 65–99)

## 2015-12-13 LAB — RENAL FUNCTION PANEL
ALBUMIN: 2.8 g/dL — AB (ref 3.5–5.0)
Anion gap: 15 (ref 5–15)
BUN: 105 mg/dL — AB (ref 6–20)
CALCIUM: 8.7 mg/dL — AB (ref 8.9–10.3)
CO2: 24 mmol/L (ref 22–32)
CREATININE: 7.74 mg/dL — AB (ref 0.61–1.24)
Chloride: 88 mmol/L — ABNORMAL LOW (ref 101–111)
GFR, EST AFRICAN AMERICAN: 7 mL/min — AB (ref >60)
GFR, EST NON AFRICAN AMERICAN: 6 mL/min — AB (ref >60)
Glucose, Bld: 139 mg/dL — ABNORMAL HIGH (ref 65–99)
Phosphorus: 5.9 mg/dL — ABNORMAL HIGH (ref 2.5–4.6)
Potassium: 4.2 mmol/L (ref 3.5–5.1)
SODIUM: 127 mmol/L — AB (ref 135–145)

## 2015-12-13 LAB — CBC
HCT: 22.9 % — ABNORMAL LOW (ref 39.0–52.0)
Hemoglobin: 7.7 g/dL — ABNORMAL LOW (ref 13.0–17.0)
MCH: 28.1 pg (ref 26.0–34.0)
MCHC: 33.6 g/dL (ref 30.0–36.0)
MCV: 83.6 fL (ref 78.0–100.0)
PLATELETS: 283 10*3/uL (ref 150–400)
RBC: 2.74 MIL/uL — AB (ref 4.22–5.81)
RDW: 14.3 % (ref 11.5–15.5)
WBC: 7.4 10*3/uL (ref 4.0–10.5)

## 2015-12-13 LAB — SURGICAL PCR SCREEN
MRSA, PCR: NEGATIVE
STAPHYLOCOCCUS AUREUS: POSITIVE — AB

## 2015-12-13 LAB — PROTIME-INR
INR: 1.48 (ref 0.00–1.49)
PROTHROMBIN TIME: 18 s — AB (ref 11.6–15.2)

## 2015-12-13 SURGERY — ARTERIOVENOUS (AV) FISTULA CREATION
Anesthesia: Monitor Anesthesia Care

## 2015-12-13 MED ORDER — HEPARIN SODIUM (PORCINE) 1000 UNIT/ML IJ SOLN
INTRAMUSCULAR | Status: DC | PRN
  Administered 2015-12-13: 8000 [IU] via INTRAVENOUS

## 2015-12-13 MED ORDER — FENTANYL CITRATE (PF) 250 MCG/5ML IJ SOLN
INTRAMUSCULAR | Status: AC
  Filled 2015-12-13: qty 5

## 2015-12-13 MED ORDER — FENTANYL CITRATE (PF) 100 MCG/2ML IJ SOLN: 25.0000 ug | INTRAMUSCULAR | Status: DC | PRN

## 2015-12-13 MED ORDER — DEXAMETHASONE SODIUM PHOSPHATE 10 MG/ML IJ SOLN
INTRAMUSCULAR | Status: DC | PRN
  Administered 2015-12-13: 10 mg via INTRAVENOUS

## 2015-12-13 MED ORDER — ONDANSETRON HCL 4 MG/2ML IJ SOLN: 4.0000 mg | Freq: Once | INTRAMUSCULAR | Status: DC | PRN

## 2015-12-13 MED ORDER — PAPAVERINE HCL 30 MG/ML IJ SOLN
INTRAMUSCULAR | Status: AC
  Filled 2015-12-13: qty 2

## 2015-12-13 MED ORDER — LIDOCAINE-EPINEPHRINE (PF) 1 %-1:200000 IJ SOLN
INTRAMUSCULAR | Status: AC
  Filled 2015-12-13: qty 30

## 2015-12-13 MED ORDER — CEFAZOLIN SODIUM-DEXTROSE 2-3 GM-% IV SOLR
2.0000 g | Freq: Once | INTRAVENOUS | Status: AC
  Administered 2015-12-13: 2 g via INTRAVENOUS

## 2015-12-13 MED ORDER — LIDOCAINE-EPINEPHRINE (PF) 1 %-1:200000 IJ SOLN
INTRAMUSCULAR | Status: DC | PRN
  Administered 2015-12-13: 20 mL

## 2015-12-13 MED ORDER — LIDOCAINE HCL (CARDIAC) 20 MG/ML IV SOLN
INTRAVENOUS | Status: DC | PRN
  Administered 2015-12-13: 50 mg via INTRAVENOUS

## 2015-12-13 MED ORDER — MIDAZOLAM HCL 5 MG/5ML IJ SOLN
INTRAMUSCULAR | Status: DC | PRN
  Administered 2015-12-13: 2 mg via INTRAVENOUS

## 2015-12-13 MED ORDER — HEPARIN SODIUM (PORCINE) 1000 UNIT/ML IJ SOLN
INTRAMUSCULAR | Status: AC
  Filled 2015-12-13: qty 1

## 2015-12-13 MED ORDER — MIDAZOLAM HCL 2 MG/2ML IJ SOLN
INTRAMUSCULAR | Status: AC
  Filled 2015-12-13: qty 2

## 2015-12-13 MED ORDER — PROPOFOL 500 MG/50ML IV EMUL
INTRAVENOUS | Status: DC | PRN
  Administered 2015-12-13: 50 ug/kg/min via INTRAVENOUS

## 2015-12-13 MED ORDER — HEPARIN SODIUM (PORCINE) 1000 UNIT/ML IJ SOLN
INTRAMUSCULAR | Status: DC | PRN
  Administered 2015-12-13: 4000 [IU] via INTRAVENOUS

## 2015-12-13 MED ORDER — SODIUM CHLORIDE 0.9 % IV SOLN
INTRAVENOUS | Status: DC | PRN
  Administered 2015-12-13: 09:00:00

## 2015-12-13 MED ORDER — 0.9 % SODIUM CHLORIDE (POUR BTL) OPTIME
TOPICAL | Status: DC | PRN
  Administered 2015-12-13: 1000 mL

## 2015-12-13 MED ORDER — LIDOCAINE HCL (PF) 1 % IJ SOLN
INTRAMUSCULAR | Status: AC
  Filled 2015-12-13: qty 30

## 2015-12-13 MED ORDER — PROTAMINE SULFATE 10 MG/ML IV SOLN
INTRAVENOUS | Status: DC | PRN
Start: 2015-12-13 — End: 2015-12-13
  Administered 2015-12-13 (×2): 10 mg via INTRAVENOUS
  Administered 2015-12-13: 20 mg via INTRAVENOUS

## 2015-12-13 MED ORDER — DIPHENHYDRAMINE HCL 50 MG/ML IJ SOLN
INTRAMUSCULAR | Status: DC | PRN
  Administered 2015-12-13: 25 mg via INTRAVENOUS

## 2015-12-13 SURGICAL SUPPLY — 57 items
ARMBAND PINK RESTRICT EXTREMIT (MISCELLANEOUS) ×4 IMPLANT
BAG DECANTER FOR FLEXI CONT (MISCELLANEOUS) ×4 IMPLANT
BIOPATCH RED 1 DISK 7.0 (GAUZE/BANDAGES/DRESSINGS) ×3 IMPLANT
BIOPATCH RED 1IN DISK 7.0MM (GAUZE/BANDAGES/DRESSINGS) ×1
CANISTER SUCTION 2500CC (MISCELLANEOUS) ×4 IMPLANT
CANNULA VESSEL 3MM 2 BLNT TIP (CANNULA) ×4 IMPLANT
CATH CANNON HEMO 15FR 23CM (HEMODIALYSIS SUPPLIES) ×2 IMPLANT
CATH PALINDROME RT-P 15FX19CM (CATHETERS) IMPLANT
CATH PALINDROME RT-P 15FX23CM (CATHETERS) IMPLANT
CATH PALINDROME RT-P 15FX28CM (CATHETERS) IMPLANT
CATH PALINDROME RT-P 15FX55CM (CATHETERS) IMPLANT
CHLORAPREP W/TINT 26ML (MISCELLANEOUS) ×4 IMPLANT
CLIP TI MEDIUM 6 (CLIP) ×4 IMPLANT
CLIP TI WIDE RED SMALL 6 (CLIP) ×4 IMPLANT
COVER PROBE W GEL 5X96 (DRAPES) IMPLANT
DECANTER SPIKE VIAL GLASS SM (MISCELLANEOUS) ×4 IMPLANT
DRAPE C-ARM 42X72 X-RAY (DRAPES) ×4 IMPLANT
DRAPE CHEST BREAST 15X10 FENES (DRAPES) ×4 IMPLANT
ELECT REM PT RETURN 9FT ADLT (ELECTROSURGICAL) ×4
ELECTRODE REM PT RTRN 9FT ADLT (ELECTROSURGICAL) ×2 IMPLANT
GAUZE SPONGE 2X2 8PLY STRL LF (GAUZE/BANDAGES/DRESSINGS) ×2 IMPLANT
GAUZE SPONGE 4X4 16PLY XRAY LF (GAUZE/BANDAGES/DRESSINGS) ×4 IMPLANT
GLOVE BIO SURGEON STRL SZ7.5 (GLOVE) ×6 IMPLANT
GLOVE BIOGEL PI IND STRL 6.5 (GLOVE) IMPLANT
GLOVE BIOGEL PI IND STRL 8 (GLOVE) ×2 IMPLANT
GLOVE BIOGEL PI INDICATOR 6.5 (GLOVE) ×4
GLOVE BIOGEL PI INDICATOR 8 (GLOVE) ×4
GLOVE SS BIOGEL STRL SZ 6.5 (GLOVE) IMPLANT
GLOVE SUPERSENSE BIOGEL SZ 6.5 (GLOVE) ×4
GOWN STRL REUS W/ TWL LRG LVL3 (GOWN DISPOSABLE) ×6 IMPLANT
GOWN STRL REUS W/TWL LRG LVL3 (GOWN DISPOSABLE) ×12
KIT BASIN OR (CUSTOM PROCEDURE TRAY) ×4 IMPLANT
KIT ROOM TURNOVER OR (KITS) ×4 IMPLANT
LIQUID BAND (GAUZE/BANDAGES/DRESSINGS) ×4 IMPLANT
NDL 18GX1X1/2 (RX/OR ONLY) (NEEDLE) ×2 IMPLANT
NDL HYPO 25GX1X1/2 BEV (NEEDLE) ×2 IMPLANT
NEEDLE 18GX1X1/2 (RX/OR ONLY) (NEEDLE) ×4 IMPLANT
NEEDLE 22X1 1/2 (OR ONLY) (NEEDLE) ×4 IMPLANT
NEEDLE HYPO 25GX1X1/2 BEV (NEEDLE) ×4 IMPLANT
NS IRRIG 1000ML POUR BTL (IV SOLUTION) ×4 IMPLANT
PACK CV ACCESS (CUSTOM PROCEDURE TRAY) ×4 IMPLANT
PACK SURGICAL SETUP 50X90 (CUSTOM PROCEDURE TRAY) ×4 IMPLANT
PAD ARMBOARD 7.5X6 YLW CONV (MISCELLANEOUS) ×8 IMPLANT
SPONGE GAUZE 2X2 STER 10/PKG (GAUZE/BANDAGES/DRESSINGS) ×2
SPONGE SURGIFOAM ABS GEL 100 (HEMOSTASIS) IMPLANT
SUT ETHILON 3 0 PS 1 (SUTURE) ×4 IMPLANT
SUT PROLENE 6 0 BV (SUTURE) ×4 IMPLANT
SUT VIC AB 3-0 SH 27 (SUTURE) ×4
SUT VIC AB 3-0 SH 27X BRD (SUTURE) ×2 IMPLANT
SUT VICRYL 4-0 PS2 18IN ABS (SUTURE) ×6 IMPLANT
SYR 20CC LL (SYRINGE) ×8 IMPLANT
SYR 5ML LL (SYRINGE) ×8 IMPLANT
SYR CONTROL 10ML LL (SYRINGE) ×4 IMPLANT
SYRINGE 10CC LL (SYRINGE) ×4 IMPLANT
TAPE CLOTH 2X10 TAN LF (GAUZE/BANDAGES/DRESSINGS) ×2 IMPLANT
UNDERPAD 30X30 INCONTINENT (UNDERPADS AND DIAPERS) ×4 IMPLANT
WATER STERILE IRR 1000ML POUR (IV SOLUTION) ×4 IMPLANT

## 2015-12-13 NOTE — Anesthesia Postprocedure Evaluation (Signed)
Anesthesia Post Note  Patient: Albert Stanley  Procedure(s) Performed: Procedure(s) (LRB): ARTERIOVENOUS (AV) FISTULA CREATION (Left) INSERTION OF DIALYSIS CATHETER (N/A)  Patient location during evaluation: PACU Anesthesia Type: MAC Level of consciousness: awake and alert Pain management: pain level controlled Vital Signs Assessment: post-procedure vital signs reviewed and stable Respiratory status: spontaneous breathing, nonlabored ventilation, respiratory function stable and patient connected to nasal cannula oxygen Cardiovascular status: stable and blood pressure returned to baseline Anesthetic complications: no    Last Vitals:  Filed Vitals:   12/13/15 1015 12/13/15 1030  BP: 135/81 139/71  Pulse: 74 74  Temp:  36.5 C  Resp: 13 15    Last Pain:  Filed Vitals:   12/13/15 1031  PainSc: 0-No pain                 Zenaida Deed

## 2015-12-13 NOTE — Anesthesia Procedure Notes (Signed)
Procedure Name: MAC Date/Time: 12/13/2015 7:35 AM Performed by: Mariea Clonts Pre-anesthesia Checklist: Patient identified, Patient being monitored, Timeout performed, Emergency Drugs available and Suction available Patient Re-evaluated:Patient Re-evaluated prior to inductionOxygen Delivery Method: Simple face mask Preoxygenation: Pre-oxygenation with 100% oxygen Intubation Type: IV induction

## 2015-12-13 NOTE — H&P (View-Only) (Signed)
[facility]  Subjective  - some right flank pain last night, now better  Objective 138/92 79 98.8 F (37.1 C) (Oral) 18 96%  Intake/Output Summary (Last 24 hours) at 12/12/15 1023 Last data filed at 12/12/15 0600  Gross per 24 hour  Intake    724 ml  Output   1200 ml  Net   -476 ml   DATA vein map reviewed upper arm cephalic seems good quality bilaterally  Assessment/Planning: Left brachial cephalic AVF and tunneled catheter tomorrow by my partner Dr Scot Dock.  Procedure details risk benefit discussed.  NPO p midnight Consent Antibiotics on call  Ruta Hinds 12/12/2015 10:23 AM --  Laboratory Lab Results:  Recent Labs  12/11/15 1156 12/12/15 0754  WBC 8.1 7.6  HGB 7.5* 7.7*  HCT 22.3* 23.3*  PLT 237 259   BMET  Recent Labs  12/11/15 0825 12/12/15 0754  NA 131* 129*  K 3.7 4.2  CL 94* 91*  CO2 23 25  GLUCOSE 130* 153*  BUN 103* 103*  CREATININE 7.94* 7.76*  CALCIUM 8.5* 8.7*    COAG Lab Results  Component Value Date   INR 1.45 12/11/2015   INR 1.60* 12/07/2015   INR 1.53* 12/06/2015   No results found for: PTT

## 2015-12-13 NOTE — Anesthesia Preprocedure Evaluation (Addendum)
Anesthesia Evaluation  Patient identified by MRN, date of birth, ID band Patient awake    Reviewed: Allergy & Precautions, H&P , NPO status , Patient's Chart, lab work & pertinent test results, reviewed documented beta blocker date and time   History of Anesthesia Complications Negative for: history of anesthetic complications  Airway Mallampati: II  TM Distance: >3 FB Neck ROM: full    Dental no notable dental hx.    Pulmonary asthma , former smoker,    Pulmonary exam normal breath sounds clear to auscultation       Cardiovascular Exercise Tolerance: Poor hypertension, +CHF  Normal cardiovascular exam Rhythm:regular Rate:Normal  Depressed EF this admission in setting of renal failure and volume overload, likely 35%   Neuro/Psych  Neuromuscular disease    GI/Hepatic negative GI ROS, Neg liver ROS,   Endo/Other  negative endocrine ROSdiabetes  Renal/GU ESRFRenal diseasenegative Renal ROS     Musculoskeletal  (+) Arthritis ,   Abdominal   Peds  Hematology negative hematology ROS (+) anemia ,   Anesthesia Other Findings Patient does have right renal hematoma, uremia with volume overload in setting of worsening renal failure and need for dialysis catheter placement  Also has hx of DVT and was anticoagulated for that, INR has been reversed  Hyponatremic with Na of 128 appears chronic for days, in setting of renal failure, BUN 105, Hgb 7.7 chronic, K is 4.2  Reproductive/Obstetrics negative OB ROS                            Anesthesia Physical Anesthesia Plan  ASA: IV  Anesthesia Plan: MAC   Post-op Pain Management:    Induction: Intravenous  Airway Management Planned: Simple Face Mask  Additional Equipment:   Intra-op Plan:   Post-operative Plan: Possible Post-op intubation/ventilation  Informed Consent: I have reviewed the patients History and Physical, chart, labs and  discussed the procedure including the risks, benefits and alternatives for the proposed anesthesia with the patient or authorized representative who has indicated his/her understanding and acceptance.   Dental Advisory Given  Plan Discussed with: Anesthesiologist, CRNA and Surgeon  Anesthesia Plan Comments:         Anesthesia Quick Evaluation

## 2015-12-13 NOTE — Op Note (Signed)
    NAME: [name]   MRN: [id number] DOB: 02/13/1946    DATE OF OPERATION: 12/13/2015  PREOP DIAGNOSIS: Stage V chronic kidney disease  POSTOP DIAGNOSIS: Same  PROCEDURE:  1. Ultrasound-guided placement of right IJ tunneled dialysis catheter 2. Left brachiocephalic AV fistula  SURGEON: Judeth Cornfield. Scot Dock, MD, FACS  ASSIST: none  ANESTHESIA: local with sedation   EBL: minimal  INDICATIONS: Albert Stanley is a 70 y.o. male who is beginning dialysis. We were asked to place a catheter and fistula.  FINDINGS: 4 mm left upper arm cephalic vein. Patent right IJ.  TECHNIQUE: The patient was taken to the operating room and sedated by anesthesia. The neck and upper chest were prepped and draped in usual sterile fashion. Under ultrasound guidance, after the skin was anesthetized, the right IJ was cannulated and a guidewire introduced into the superior vena cava. The tract over the wire was dilated. The dilator and peel-away sheath were passed over the wire and the wire and dilator removed. The 23 cm catheter was passed through the peel-away sheath and positioned in the right atrium. The peel-away sheath was removed. The exit site for the cath was selected and the skin anesthetized between the 2 areas. The catheter was brought to the tunnel The appropriate length and the distal ports were attached. Both ports withdrew easily within flushed with heparin saline and filled with concentrated heparin. The catheter was secured at its exit site with a 3-0 nylon suture. The IJ cannulation site was closed with a 4-0 subcuticular stitch. Sterile dressing was applied.  Attention was then turned to the left arm. The left upper extremity was prepped and draped in usual sterile fashion. After the skin was anesthetized with 1% lidocaine, a transverse incision was made above the antecubital level. Here the cephalic vein was dissected free and ligated distally. The brachial artery was dissected free  beneath the fascia. The patient was heparinized. The brachial artery was clamped proximally and distally and a longitudinal arteriotomy was made. The vein was sewn end to side to the artery using continuous 6-0 Prolene suture. There were several competing branches which were ligated with 2-0 silk ties. There was a good radial and ulnar signal with the Doppler at the completion. There was an excellent thrill in the fistula. Hemostasis was obtained in the wound. The heparin was partially reversed with protamine. The wound was closed deeply at 3:00 on the skin closed with 4-0 Vicryl. Liquid bandage was applied. The patient tolerated the procedure well was transferred to the recovery room in stable condition. All needle and sponge Correct.  [name], MD, FACS [facility]  DATE OF DICTATION:   12/13/2015

## 2015-12-13 NOTE — Interval H&P Note (Signed)
History and Physical Interval Note:  12/13/2015 7:01 AM  Albert Stanley  has presented today for surgery, with the diagnosis of Stage IV Chronic Kidney Disease N18.4  The various methods of treatment have been discussed with the patient and family. After consideration of risks, benefits and other options for treatment, the patient has consented to  Procedure(s): ARTERIOVENOUS (AV) FISTULA CREATION (Left) INSERTION OF DIALYSIS CATHETER (N/A) as a surgical intervention .  The patient's history has been reviewed, patient examined, no change in status, stable for surgery.  I have reviewed the patient's chart and labs.  Questions were answered to the patient's satisfaction.     [name]

## 2015-12-13 NOTE — Progress Notes (Addendum)
[facility] ROUNDING NOTE   Subjective:   Interval History: planned for AVG and catheter  Objective:  Vital signs in last 24 hours:  Temp:  [98 F (36.7 C)-98.7 F (37.1 C)] 98.7 F (37.1 C) (03/03 0513) Pulse Rate:  [73-81] 81 (03/03 0513) Resp:  [18-22] 22 (03/03 0513) BP: (127-146)/(76-90) 146/86 mmHg (03/03 0513) SpO2:  [98 %-99 %] 99 % (03/03 0513) Weight:  [112.492 kg (248 lb)] 112.492 kg (248 lb) (03/02 2041)  Weight change: 0.092 kg (3.3 oz) Filed Weights   12/11/15 0500 12/11/15 2042 12/12/15 2041  Weight: 112.2 kg (247 lb 5.7 oz) 112.4 kg (247 lb 12.8 oz) 112.492 kg (248 lb)    Intake/Output: I/O last 3 completed shifts: In: [id number] [P.O.:1080; IV Piggyback:124] Out: 2450 [Urine:2450]   Intake/Output this shift:     CVS- RRR RS- CTA ABD- BS present soft non-distended EXT-  3 + edema   Basic Metabolic Panel:  Recent Labs Lab 12/09/15 0818 12/10/15 0705 12/11/15 0825 12/12/15 0754 12/13/15 0500  NA 132* 130* 131* 129* 127*  K 3.3* 3.6 3.7 4.2 4.2  CL 95* 94* 94* 91* 88*  CO2 22 23 23 25 24   GLUCOSE 120* 129* 130* 153* 139*  BUN 99* 101* 103* 103* 105*  CREATININE 7.94* 8.11* 7.94* 7.76* 7.74*  CALCIUM 8.5* 8.5* 8.5* 8.7* 8.7*  MG  --  2.2  --  2.3  --   PHOS  --   --  5.9* 5.9* 5.9*    Liver Function Tests:  Recent Labs Lab 12/11/15 0825 12/12/15 0754 12/13/15 0500  ALBUMIN 2.7* 2.8* 2.8*   No results for input(s): LIPASE, AMYLASE in the last 168 hours. No results for input(s): AMMONIA in the last 168 hours.  CBC:  Recent Labs Lab 12/10/15 0705 12/11/15 0825 12/11/15 1156 12/12/15 0754 12/13/15 0500  WBC 7.3 7.1 8.1 7.6 7.4  NEUTROABS  --  4.7  --  4.9  --   HGB 8.0* 7.5* 7.5* 7.7* 7.7*  HCT 23.0* 23.1* 22.3* 23.3* 22.9*  MCV 83.0 83.4 82.9 83.8 83.6  PLT 224 254 237 259 283    Cardiac Enzymes: No results for input(s): CKTOTAL, CKMB, CKMBINDEX, TROPONINI in the last 168 hours.  BNP: Invalid input(s):  POCBNP  CBG:  Recent Labs Lab 12/11/15 2358 12/12/15 0803 12/12/15 1148 12/12/15 1636 12/12/15 2038  GLUCAP 148* 159* 194* 174* 191*    Microbiology: Results for orders placed or performed during the hospital encounter of 12/03/15  Urine culture     Status: None   Collection Time: 12/04/15  7:07 AM  Result Value Ref Range Status   Specimen Description URINE, CLEAN CATCH  Final   Special Requests NONE  Final   Culture   Final    NO GROWTH 1 DAY Performed at [facility]    Report Status 12/05/2015 FINAL  Final  Surgical pcr screen     Status: Abnormal   Collection Time: 12/13/15  5:49 AM  Result Value Ref Range Status   MRSA, PCR NEGATIVE NEGATIVE Final   Staphylococcus aureus POSITIVE (A) NEGATIVE Final    Comment:        The Xpert SA Assay (FDA approved for NASAL specimens in patients over 51 years of age), is one component of a comprehensive surveillance program.  Test performance has been validated by [facility] for patients greater than or equal to 46 year old. It is not intended to diagnose infection nor to guide or monitor treatment.  Coagulation Studies:  Recent Labs  12/11/15 0647 12/13/15 0500  LABPROT 17.7* 18.0*  INR 1.45 1.48    Urinalysis: No results for input(s): COLORURINE, LABSPEC, PHURINE, GLUCOSEU, HGBUR, BILIRUBINUR, KETONESUR, PROTEINUR, UROBILINOGEN, NITRITE, LEUKOCYTESUR in the last 72 hours.  Invalid input(s): APPERANCEUR    Imaging: Dg Fluoro Guide Cv Line-no Report  12/13/2015  CLINICAL DATA:  FLOURO GUIDE CV LINE Fluoroscopy was utilized by the requesting physician.  No radiographic interpretation.     Medications:   . sodium chloride 0  (12/08/15 1802)   . [MAR Hold] amLODipine  10 mg Oral Daily  . [MAR Hold] atorvastatin  40 mg Oral q1800  . [MAR Hold] bisacodyl  10 mg Oral Daily  . [MAR Hold] calcitRIOL  0.5 mcg Oral Daily  . [MAR Hold] carvedilol  6.25 mg Oral BID WC  . [MAR Hold] darbepoetin  (ARANESP) injection - NON-DIALYSIS  60 mcg Subcutaneous Q Mon-1800  . [MAR Hold] ferric gluconate (FERRLECIT/NULECIT) IV  125 mg Intravenous QODAY  . [MAR Hold] furosemide  120 mg Intravenous BID  . [MAR Hold] insulin aspart  0-9 Units Subcutaneous TID WC  . [MAR Hold] metolazone  10 mg Oral Daily  . [MAR Hold] polyethylene glycol  17 g Oral Daily  . [MAR Hold] potassium chloride  20 mEq Oral BID  . [MAR Hold] senna-docusate  1 tablet Oral BID  . [MAR Hold] sodium chloride flush  3 mL Intravenous Q12H  . 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 Creat 7 recommend dialysis preparation with AVF placement  2 Pulm edema / LE edema - better IV lasix and metolazone 3 R flank pain/ subcapsular hematoma - reversed anticoagulation  4 HTN on norvasc/ coreg, bp's good 5 Anemia of CKD- Hb 7.1, Stable now with aranesp 6 Hx bladder cancer 7 Renal cysts, some complex lesions - needs MRI when more stable 8 Systolic CHF - no cath for now per cardiology; reassess  9 Constipated - dulcolax ordered 10.DVT - anticoagulation reversed  dialysis planned in AM    LOS: 9 Naelle Diegel W @TODAY @8 :41 AM  Patient is End Stage Renal Disease

## 2015-12-13 NOTE — Progress Notes (Signed)
[facility] Progress Note  Patient: Albert Stanley [id number]   PCP: [name], MD DOB: December 03, 1945   DOA: 12/03/2015   DOS: 12/13/2015   Date of Service: the patient was seen and examined on 12/13/2015  Subjective: Patient was seen after the procedures. He was drowsy but did not have any acute complaint. No evidence of pain reported last night Nutrition: Tolerating oral diet Activity: Mostly bedridden Last BM: 12/12/2015  Assessment and Plan: 1. Renal hematoma, right The patient presents with complaints of right flank pain. Workup in the ER showed that he had developed right-sided hematoma. Urology was consulted and recommended to hold anticoagulation and continue to monitor the patient clinically. No acute surgical intervention required. H&H remained stable. We'll continue to monitor clinically. Pain control as needed  2. Acute on chronic kidney disease. Baseline creatinine 4, presentation 5.4 with volume overload and evidence of uremia. Patient has been on IV Lasix as well as Zaroxolyn. So far -11 L in the hospital. His BUN is consistently climbing and serum creatinine remaining stable. With this patient was discussed with vascular surgery for vein mapping and AV fistula to initiate potential hemodialysis. Nephrology continues to follow-up with the patient. Continue IV Lasix.  3. History of DVT with chronic anticoagulation. Presented with renal hematoma on the right side. H&H remains stable. INR has been reversed. 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 Given this factor patient is currently remaining off anticoagulation. Prior to discharge will include discussed with neurology regarding resuming anticoagulation  4. Acute systolic CHF. Prior echocardiogram showed ejection fraction of 55-60%. This admission echo program were showing 35-40%. Regional wall motion abnormalities were also seen. Cardiology was  consulted and the patient was considered a poor candidate for cardiac intervention due to his renal failure. Recommendation is to stabilize his renal function and follow-up with cardiology for cardiac workup as an outpatient after patient is on hemodialysis. He is continue on IV Lasix as well as Zaroxolyn. Started on Coreg, continue amlodipine. Will discussed with cardiology once the patient is on hemodialysis here in the hospital.  5. Normocytic anemia. H&H remaining 7.5.  Transfuse when necessary for hemoglobin less than 7  6. Type 2 diabetes mellitus. On Januvia at home. Currently here on sliding scale insulin. Sugars are better controlled. HbA1c on 11/2015 5.8  DVT Prophylaxis: mechanical compression device. Nutrition: Renal diet Advance goals of care discussion: Full code  Brief Summary of Hospitalization:  HPI: As per the H and P dictated on admission, "[name] is a 70 y.o. male with a past medical history of chronic kidney disease unknown stage, history of bladder cancer, history of diabetes on oral agents, history of DVT on anticoagulation, who presented with complaint of pain in the right flank since yesterday evening at 6 PM. The pain was 10 out of 10 in intensity. It was sudden onset. No radiation of the pain. Was associated with vomiting. No precipitating aggravating or relieving factors. Denies any fever or chills. After getting pain medications, pain is better and down to 6 out of 10 in intensity. Denies any lightheadedness. No previous episodes of same. Denies any blood in the urine. No pain with urination.  In the emergency department he was found to have a right renal subcapsular hematoma with suspicion for bleeding into a renal cyst. He will be hospitalized for further management." Procedures: Echocardiogram Consultants: Nephrology, vascular surgery, cardiology Antibiotics: Anti-infectives    Start  Dose/Rate Route Frequency Ordered Stop   12/13/15 0800   ceFAZolin (ANCEF) IVPB 2 g/50 mL premix     2 g 100 mL/hr over 30 Minutes Intravenous  Once 12/13/15 0747 12/13/15 0747   12/13/15 0715  vancomycin (VANCOCIN) powder 1,000 mg     1,000 mg Other To Surgery 12/12/15 1040 12/14/15 0715      Family Communication: family was present at bedside, at the time of interview.  Questions were answered satisfactorily.  Disposition:  Barriers to safe discharge: Improvement in renal function   Intake/Output Summary (Last 24 hours) at 12/13/15 1515 Last data filed at 12/13/15 1053  Gross per 24 hour  Intake    540 ml  Output   1470 ml  Net   -930 ml   Filed Weights   12/11/15 0500 12/11/15 2042 12/12/15 2041  Weight: 112.2 kg (247 lb 5.7 oz) 112.4 kg (247 lb 12.8 oz) 112.492 kg (248 lb)    Objective: Physical Exam: Filed Vitals:   12/13/15 1000 12/13/15 1015 12/13/15 1030 12/13/15 1046  BP: 149/75 135/81 139/71 137/68  Pulse: 73 74 74 75  Temp:   97.7 F (36.5 C) 97.7 F (36.5 C)  TempSrc:    Oral  Resp: 10 13 15 16   Height:      Weight:      SpO2: 93% 96% 96% 95%    General: Appear in no distress, no Rash; Oral Mucosa moist. Drowsy post procedure Cardiovascular: S1 and S2 Present, no Murmur, no JVD Respiratory: Bilateral Air entry present and Clear to Auscultation, no Crackles, no wheezes Abdomen: Bowel Sound present, Soft and no right flank tenderness Extremities: no Pedal edema, no calf tenderness  Data Reviewed: CBC:  Recent Labs Lab 12/10/15 0705 12/11/15 0825 12/11/15 1156 12/12/15 0754 12/13/15 0500  WBC 7.3 7.1 8.1 7.6 7.4  NEUTROABS  --  4.7  --  4.9  --   HGB 8.0* 7.5* 7.5* 7.7* 7.7*  HCT 23.0* 23.1* 22.3* 23.3* 22.9*  MCV 83.0 83.4 82.9 83.8 83.6  PLT 224 254 237 259 Q000111Q   Basic Metabolic Panel:  Recent Labs Lab 12/09/15 0818 12/10/15 0705 12/11/15 0825 12/12/15 0754 12/13/15 0500  NA 132* 130* 131* 129* 127*  K 3.3* 3.6 3.7 4.2 4.2  CL 95* 94* 94* 91* 88*  CO2 22 23 23 25 24   GLUCOSE 120* 129*  130* 153* 139*  BUN 99* 101* 103* 103* 105*  CREATININE 7.94* 8.11* 7.94* 7.76* 7.74*  CALCIUM 8.5* 8.5* 8.5* 8.7* 8.7*  MG  --  2.2  --  2.3  --   PHOS  --   --  5.9* 5.9* 5.9*   Liver Function Tests:  Recent Labs Lab 12/11/15 0825 12/12/15 0754 12/13/15 0500  ALBUMIN 2.7* 2.8* 2.8*   No results for input(s): LIPASE, AMYLASE in the last 168 hours. No results for input(s): AMMONIA in the last 168 hours.  Cardiac Enzymes: No results for input(s): CKTOTAL, CKMB, CKMBINDEX, TROPONINI in the last 168 hours.  BNP (last 3 results)  Recent Labs  02/06/15 2204  BNP 53.4    CBG:  Recent Labs Lab 12/12/15 1636 12/12/15 2038 12/13/15 0926 12/13/15 1043 12/13/15 1159  GLUCAP 174* 191* 131* 140* 174*    Recent Results (from the past 240 hour(s))  Urine culture     Status: None   Collection Time: 12/04/15  7:07 AM  Result Value Ref Range Status   Specimen Description URINE, CLEAN CATCH  Final   Special Requests NONE  Final   Culture   Final    NO GROWTH 1 DAY Performed at [facility]    Report Status 12/05/2015 FINAL  Final  Surgical pcr screen     Status: Abnormal   Collection Time: 12/13/15  5:49 AM  Result Value Ref Range Status   MRSA, PCR NEGATIVE NEGATIVE Final   Staphylococcus aureus POSITIVE (A) NEGATIVE Final    Comment:        The Xpert SA Assay (FDA approved for NASAL specimens in patients over 64 years of age), is one component of a comprehensive surveillance program.  Test performance has been validated by [facility] for patients greater than or equal to 44 year old. It is not intended to diagnose infection nor to guide or monitor treatment.      Studies: Dg Chest Port 1 View  12/13/2015  CLINICAL DATA:  Status post right tunneled dialysis catheter placement. EXAM: PORTABLE CHEST 1 VIEW COMPARISON:  12/07/2015 FINDINGS: Right jugular tunnel dialysis catheter present with the distal lumen tip just below the SVC/RA junction. Stable  cardiac enlargement. No pneumothorax or edema. No pleural fluid identified. IMPRESSION: Status post tunneled dialysis catheter placement. No evidence of pneumothorax or other acute finding. The distal lumen tip lies just below the SVC/RA junction. Electronically Signed   By: Aletta Edouard M.D.   On: 12/13/2015 09:42   Dg Fluoro Guide Cv Line-no Report  12/13/2015  CLINICAL DATA:  FLOURO GUIDE CV LINE Fluoroscopy was utilized by the requesting physician.  No radiographic interpretation.     Scheduled Meds: . amLODipine  10 mg Oral Daily  . atorvastatin  40 mg Oral q1800  . bisacodyl  10 mg Oral Daily  . calcitRIOL  0.5 mcg Oral Daily  . carvedilol  6.25 mg Oral BID WC  . darbepoetin (ARANESP) injection - NON-DIALYSIS  60 mcg Subcutaneous Q Mon-1800  . ferric gluconate (FERRLECIT/NULECIT) IV  125 mg Intravenous QODAY  . furosemide  120 mg Intravenous BID  . insulin aspart  0-9 Units Subcutaneous TID WC  . metolazone  10 mg Oral Daily  . polyethylene glycol  17 g Oral Daily  . potassium chloride  20 mEq Oral BID  . senna-docusate  1 tablet Oral BID  . sodium chloride flush  3 mL Intravenous Q12H  . vancomycin  1,000 mg Other To OR   Continuous Infusions: . sodium chloride 0  (12/08/15 1802)   PRN Meds: acetaminophen **OR** acetaminophen, albuterol, alum & mag hydroxide-simeth, bisacodyl, cyclobenzaprine, famotidine, HYDROmorphone (DILAUDID) injection, ondansetron **OR** ondansetron (ZOFRAN) IV, oxyCODONE  Time spent: 30 minutes  Author: Berle Mull, MD [facility] Pager: [phone] 12/13/2015 3:15 PM  If 7PM-7AM, please contact night-coverage at [email or web address], password [email or web address]

## 2015-12-13 NOTE — Transfer of Care (Signed)
Immediate Anesthesia Transfer of Care Note  Patient: Albert Stanley  Procedure(s) Performed: Procedure(s): ARTERIOVENOUS (AV) FISTULA CREATION (Left) INSERTION OF DIALYSIS CATHETER (N/A)  Patient Location: PACU  Anesthesia Type:MAC  Level of Consciousness: awake, alert  and oriented  Airway & Oxygen Therapy: Patient Spontanous Breathing and Patient connected to nasal cannula oxygen  Post-op Assessment: Report given to RN, Post -op Vital signs reviewed and stable and Patient moving all extremities X 4  Post vital signs: Reviewed and stable  Last Vitals:  Filed Vitals:   12/13/15 0513 12/13/15 0923  BP: 146/86   Pulse: 81   Temp: 37.1 C 36.3 C  Resp: 22     Complications: No apparent anesthesia complications

## 2015-12-14 LAB — RENAL FUNCTION PANEL
ALBUMIN: 2.8 g/dL — AB (ref 3.5–5.0)
ANION GAP: 15 (ref 5–15)
BUN: 117 mg/dL — ABNORMAL HIGH (ref 6–20)
CALCIUM: 9.2 mg/dL (ref 8.9–10.3)
CO2: 23 mmol/L (ref 22–32)
Chloride: 86 mmol/L — ABNORMAL LOW (ref 101–111)
Creatinine, Ser: 7.63 mg/dL — ABNORMAL HIGH (ref 0.61–1.24)
GFR calc Af Amer: 7 mL/min — ABNORMAL LOW (ref >60)
GFR, EST NON AFRICAN AMERICAN: 6 mL/min — AB (ref >60)
Glucose, Bld: 249 mg/dL — ABNORMAL HIGH (ref 65–99)
PHOSPHORUS: 6.4 mg/dL — AB (ref 2.5–4.6)
POTASSIUM: 3.9 mmol/L (ref 3.5–5.1)
Sodium: 124 mmol/L — ABNORMAL LOW (ref 135–145)

## 2015-12-14 LAB — CBC
HEMATOCRIT: 22.4 % — AB (ref 39.0–52.0)
HEMOGLOBIN: 7.7 g/dL — AB (ref 13.0–17.0)
MCH: 28.7 pg (ref 26.0–34.0)
MCHC: 34.4 g/dL (ref 30.0–36.0)
MCV: 83.6 fL (ref 78.0–100.0)
Platelets: 291 10*3/uL (ref 150–400)
RBC: 2.68 MIL/uL — ABNORMAL LOW (ref 4.22–5.81)
RDW: 14.7 % (ref 11.5–15.5)
WBC: 6.7 10*3/uL (ref 4.0–10.5)

## 2015-12-14 LAB — GLUCOSE, CAPILLARY
GLUCOSE-CAPILLARY: 163 mg/dL — AB (ref 65–99)
GLUCOSE-CAPILLARY: 226 mg/dL — AB (ref 65–99)
GLUCOSE-CAPILLARY: 186 mg/dL — AB (ref 65–99)

## 2015-12-14 MED ORDER — SODIUM CHLORIDE 0.9 % IV SOLN: 100.0000 mL | INTRAVENOUS | Status: DC | PRN

## 2015-12-14 MED ORDER — PENTAFLUOROPROP-TETRAFLUOROETH EX AERO: 1.0000 "application " | INHALATION_SPRAY | CUTANEOUS | Status: DC | PRN

## 2015-12-14 MED ORDER — LIDOCAINE-PRILOCAINE 2.5-2.5 % EX CREA
1.0000 "application " | TOPICAL_CREAM | CUTANEOUS | Status: DC | PRN
  Filled 2015-12-14: qty 5

## 2015-12-14 MED ORDER — OXYCODONE HCL 5 MG PO TABS
ORAL_TABLET | ORAL | Status: AC
  Filled 2015-12-14: qty 1

## 2015-12-14 MED ORDER — HEPARIN SODIUM (PORCINE) 1000 UNIT/ML DIALYSIS: 1000.0000 [IU] | INTRAMUSCULAR | Status: DC | PRN

## 2015-12-14 MED ORDER — SEVELAMER CARBONATE 800 MG PO TABS
1600.0000 mg | ORAL_TABLET | Freq: Three times a day (TID) | ORAL | Status: DC
  Administered 2015-12-14 – 2015-12-18 (×13): 1600 mg via ORAL
  Filled 2015-12-14 (×8): qty 2

## 2015-12-14 MED ORDER — ALTEPLASE 2 MG IJ SOLR
2.0000 mg | Freq: Once | INTRAMUSCULAR | Status: DC | PRN
  Filled 2015-12-14: qty 2

## 2015-12-14 MED ORDER — LIDOCAINE HCL (PF) 1 % IJ SOLN: 5.0000 mL | INTRAMUSCULAR | Status: DC | PRN

## 2015-12-14 NOTE — Progress Notes (Signed)
[facility] Progress Note  Patient: Albert Stanley [id number]   PCP: [name], MD DOB: 31-Jan-1946   DOA: 12/03/2015   DOS: 12/14/2015   Date of Service: the patient was seen and examined on 12/14/2015  Subjective: Tolerating hemodialysis well. Did not have any acute complaint. The pain has been improving as well. Nutrition: Tolerating oral diet Activity: Walking in the room Last BM: 12/13/2015  Assessment and Plan: 1. Renal hematoma, right The patient presents with complaints of right flank pain. Workup in the ER showed that he had developed right-sided hematoma. Urology was consulted and recommended to hold anticoagulation and continue to monitor the patient clinically. No acute surgical intervention required. H&H remained stable. We'll continue to monitor clinically. Pain control as needed  2. Acute on chronic kidney disease. Baseline creatinine 4, presentation 5.4 with volume overload and evidence of uremia. Patient has been on IV Lasix as well as Zaroxolyn. So far -11 L in the hospital. His BUN is consistently climbing and serum creatinine remaining stable. With this patient was discussed with vascular surgery for vein mapping and AV fistula. Nephrology continues to follow up. Next on patient has been started on hemodialysis.  3. History of DVT with chronic anticoagulation. Presented with renal hematoma on the right side. H&H remains stable. INR has been reversed. 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 Given this factor patient is currently remaining off anticoagulation. Prior to discharge will discuss with urology regarding resuming anticoagulation  4. Acute systolic CHF. Prior echocardiogram showed ejection fraction of 55-60%. This admission echo program were showing 35-40%. Regional wall motion abnormalities were also seen. Cardiology was consulted and the patient was considered a poor candidate  for cardiac intervention due to his renal failure. Recommendation is to stabilize his renal function and follow-up with cardiology for cardiac workup as an outpatient after patient is on hemodialysis. He is continue on IV Lasix as well as Zaroxolyn. Started on Coreg, continue amlodipine. Will discussed with cardiology once the patient is on hemodialysis here in the hospital.  5. Normocytic anemia. H&H remaining 7.5.  Transfuse when necessary for hemoglobin less than 7  6. Type 2 diabetes mellitus. On Januvia at home. Currently here on sliding scale insulin. Sugars are better controlled. HbA1c on 11/2015 5.8  DVT Prophylaxis: mechanical compression device. Nutrition: Renal diet Advance goals of care discussion: Full code  Brief Summary of Hospitalization:  HPI: As per the H and P dictated on admission, "Albert Stanley is a 70 y.o. male with a past medical history of chronic kidney disease unknown stage, history of bladder cancer, history of diabetes on oral agents, history of DVT on anticoagulation, who presented with complaint of pain in the right flank since yesterday evening at 6 PM. The pain was 10 out of 10 in intensity. It was sudden onset. No radiation of the pain. Was associated with vomiting. No precipitating aggravating or relieving factors. Denies any fever or chills. After getting pain medications, pain is better and down to 6 out of 10 in intensity. Denies any lightheadedness. No previous episodes of same. Denies any blood in the urine. No pain with urination.  In the emergency department he was found to have a right renal subcapsular hematoma with suspicion for bleeding into a renal cyst. He will be hospitalized for further management." Procedures: Echocardiogram Consultants: Nephrology, vascular surgery, cardiology Antibiotics: Anti-infectives    Start     Dose/Rate Route Frequency Ordered Stop  12/13/15 0800  ceFAZolin (ANCEF) IVPB 2 g/50 mL premix     2 g 100 mL/hr  over 30 Minutes Intravenous  Once 12/13/15 0747 12/13/15 0747   12/13/15 0715  vancomycin (VANCOCIN) powder 1,000 mg     1,000 mg Other To Surgery 12/12/15 1040 12/14/15 0715      Family Communication: no family was present at bedside, at the time of interview.   Disposition:  Barriers to safe discharge: Tolerating hemodialysis.   Intake/Output Summary (Last 24 hours) at 12/14/15 1422 Last data filed at 12/14/15 0900  Gross per 24 hour  Intake    480 ml  Output    975 ml  Net   -495 ml   Filed Weights   12/13/15 2100 12/14/15 0712 12/14/15 0938  Weight: 110.678 kg (244 lb) 110.6 kg (243 lb 13.3 oz) 108 kg (238 lb 1.6 oz)    Objective: Physical Exam: Filed Vitals:   12/14/15 0830 12/14/15 0900 12/14/15 0938 12/14/15 1043  BP: 125/82 136/58 118/68 127/63  Pulse: 78 50 60 78  Temp:   98 F (36.7 C) 98.6 F (37 C)  TempSrc:   Oral Oral  Resp: 16 16 18 18   Height:      Weight:   108 kg (238 lb 1.6 oz)   SpO2:   98% 98%    General: Appear in no distress, no Rash; Oral Mucosa moist. Drowsy post procedure Cardiovascular: S1 and S2 Present, no Murmur, no JVD Respiratory: Bilateral Air entry present and Clear to Auscultation, no Crackles, no wheezes Abdomen: Bowel Sound present, Soft and no right flank tenderness Extremities: no Pedal edema, no calf tenderness  Data Reviewed: CBC:  Recent Labs Lab 12/11/15 0825 12/11/15 1156 12/12/15 0754 12/13/15 0500 12/14/15 0735  WBC 7.1 8.1 7.6 7.4 6.7  NEUTROABS 4.7  --  4.9  --   --   HGB 7.5* 7.5* 7.7* 7.7* 7.7*  HCT 23.1* 22.3* 23.3* 22.9* 22.4*  MCV 83.4 82.9 83.8 83.6 83.6  PLT 254 237 259 283 Q000111Q   Basic Metabolic Panel:  Recent Labs Lab 12/10/15 0705 12/11/15 0825 12/12/15 0754 12/13/15 0500 12/14/15 0735  NA 130* 131* 129* 127* 124*  K 3.6 3.7 4.2 4.2 3.9  CL 94* 94* 91* 88* 86*  CO2 23 23 25 24 23   GLUCOSE 129* 130* 153* 139* 249*  BUN 101* 103* 103* 105* 117*  CREATININE 8.11* 7.94* 7.76* 7.74* 7.63*    CALCIUM 8.5* 8.5* 8.7* 8.7* 9.2  MG 2.2  --  2.3  --   --   PHOS  --  5.9* 5.9* 5.9* 6.4*   Liver Function Tests:  Recent Labs Lab 12/11/15 0825 12/12/15 0754 12/13/15 0500 12/14/15 0735  ALBUMIN 2.7* 2.8* 2.8* 2.8*   No results for input(s): LIPASE, AMYLASE in the last 168 hours. No results for input(s): AMMONIA in the last 168 hours.  Cardiac Enzymes: No results for input(s): CKTOTAL, CKMB, CKMBINDEX, TROPONINI in the last 168 hours.  BNP (last 3 results)  Recent Labs  02/06/15 2204  BNP 53.4    CBG:  Recent Labs Lab 12/13/15 1043 12/13/15 1159 12/13/15 1639 12/13/15 2056 12/14/15 1009  GLUCAP 140* 174* 268* 237* 163*    Recent Results (from the past 240 hour(s))  Surgical pcr screen     Status: Abnormal   Collection Time: 12/13/15  5:49 AM  Result Value Ref Range Status   MRSA, PCR NEGATIVE NEGATIVE Final   Staphylococcus aureus POSITIVE (A) NEGATIVE Final  Comment:        The Xpert SA Assay (FDA approved for NASAL specimens in patients over 52 years of age), is one component of a comprehensive surveillance program.  Test performance has been validated by [facility] for patients greater than or equal to 2 year old. It is not intended to diagnose infection nor to guide or monitor treatment.      Studies: No results found.   Scheduled Meds: . amLODipine  10 mg Oral Daily  . atorvastatin  40 mg Oral q1800  . bisacodyl  10 mg Oral Daily  . calcitRIOL  0.5 mcg Oral Daily  . carvedilol  6.25 mg Oral BID WC  . darbepoetin (ARANESP) injection - NON-DIALYSIS  60 mcg Subcutaneous Q Mon-1800  . ferric gluconate (FERRLECIT/NULECIT) IV  125 mg Intravenous QODAY  . insulin aspart  0-9 Units Subcutaneous TID WC  . polyethylene glycol  17 g Oral Daily  . potassium chloride  20 mEq Oral BID  . senna-docusate  1 tablet Oral BID  . sevelamer carbonate  1,600 mg Oral TID WC  . sodium chloride flush  3 mL Intravenous Q12H   Continuous Infusions: .  sodium chloride 0  (12/08/15 1802)   PRN Meds: sodium chloride, sodium chloride, acetaminophen **OR** acetaminophen, albuterol, alteplase, alum & mag hydroxide-simeth, bisacodyl, cyclobenzaprine, famotidine, heparin, HYDROmorphone (DILAUDID) injection, lidocaine (PF), lidocaine-prilocaine, ondansetron **OR** ondansetron (ZOFRAN) IV, oxyCODONE, pentafluoroprop-tetrafluoroeth  Time spent: 30 minutes  Author: Berle Mull, MD [facility] Pager: [phone] 12/14/2015 2:22 PM  If 7PM-7AM, please contact night-coverage at [email or web address], password [facility]

## 2015-12-14 NOTE — Procedures (Signed)
I have seen and examined this patient and agree with the plan of care Patient seen on dialysis with no issues    New AVF  Left clean site  Catheter R chest  Flow 250  BP 118/50 Chubby ankles with 1-2 + edema  Na 124    K 3.9   CO2   23    Phos 6.4    Ca 9.2     Alb  2.8  Stop lasix and metolazone  Start renvela with meals      Kearah Gayden W 12/14/2015, 8:37 AM

## 2015-12-14 NOTE — Progress Notes (Addendum)
[facility]  Subjective  - Doing well.  No numbness or weakness in the left UE.   Objective 125/82 78 98.9 F (37.2 C) (Oral) 16 98%  Intake/Output Summary (Last 24 hours) at 12/14/15 0857 Last data filed at 12/14/15 0657  Gross per 24 hour  Intake    780 ml  Output    995 ml  Net   -215 ml    Left BC Fistula with palpable thrill Hand N/V/M intact no sign of steal Incision healing well  Assessment/Planning: POD # 1  PROCEDURE:  1. Ultrasound-guided placement of right IJ tunneled dialysis catheter 2. Left brachiocephalic AV fistula F/U in the office in 6 weeks.    Laurence Slate [facility] 12/14/2015 8:57 AM --  Laboratory Lab Results:  Recent Labs  12/13/15 0500 12/14/15 0735  WBC 7.4 6.7  HGB 7.7* 7.7*  HCT 22.9* 22.4*  PLT 283 291   BMET  Recent Labs  12/13/15 0500 12/14/15 0735  NA 127* 124*  K 4.2 3.9  CL 88* 86*  CO2 24 23  GLUCOSE 139* 249*  BUN 105* 117*  CREATININE 7.74* 7.63*  CALCIUM 8.7* 9.2    COAG Lab Results  Component Value Date   INR 1.48 12/13/2015   INR 1.45 12/11/2015   INR 1.60* 12/07/2015   No results found for: PTT    I agree with the above.  I have seen and evaluated the patient.  He has an excellent thrill in his left brachiocephalic fistula.  There are no signs of steal syndrome.  He will follow-up with Dr. Scot Dock in 6 weeks  Annamarie Major

## 2015-12-14 NOTE — Progress Notes (Signed)
PT Cancellation Note  Patient Details Name: Albert Stanley MRN: [id number] DOB: 1946-01-02   Cancelled Treatment:    Reason Eval/Treat Not Completed: PT screened, no needs identified, will sign off Patient was previously evaluated and discharged by physical therapy 2/24, prior to surgical procedure on 12/13/15. Albert Stanley was able to ambulate 200 feet in the hallway today without an assistive device and did not require any physical assistance. States he may use a cane at home due to chronic knee pain. May benefit from outpatient physical therapy.  No acute PT needs identified at this time. Patient reports he is at his baseline level of function and has no concerns regarding mobility. Will sign-off at this time. Please re-consult for any significant change in functional status. Thank you for this referral.  Ellouise Newer 12/14/2015, 6:06 PM  Camille Bal [place], [place]

## 2015-12-15 LAB — HEPATITIS B CORE ANTIBODY, TOTAL: Hep B Core Total Ab: NEGATIVE

## 2015-12-15 LAB — HEPATITIS B SURFACE ANTIGEN: Hepatitis B Surface Ag: NEGATIVE

## 2015-12-15 LAB — GLUCOSE, CAPILLARY
GLUCOSE-CAPILLARY: 145 mg/dL — AB (ref 65–99)
Glucose-Capillary: 130 mg/dL — ABNORMAL HIGH (ref 65–99)
Glucose-Capillary: 169 mg/dL — ABNORMAL HIGH (ref 65–99)
Glucose-Capillary: 176 mg/dL — ABNORMAL HIGH (ref 65–99)

## 2015-12-15 LAB — HEPATITIS B SURFACE ANTIBODY,QUALITATIVE: Hep B S Ab: NONREACTIVE

## 2015-12-15 MED ORDER — SIMETHICONE 80 MG PO CHEW
80.0000 mg | CHEWABLE_TABLET | Freq: Once | ORAL | Status: AC
  Administered 2015-12-15: 80 mg via ORAL
  Filled 2015-12-15: qty 1

## 2015-12-15 NOTE — Progress Notes (Signed)
[facility] Progress Note  Patient: Albert Stanley [id number]   PCP: [name], MD DOB: 05/08/1946   DOA: 12/03/2015   DOS: 12/15/2015   Date of Service: the patient was seen and examined on 12/15/2015  Subjective: No acute complaint of pain. No nausea no vomiting. No diarrhea no constipation. No active bleeding Nutrition: Tolerating oral diet Activity: Walking in the room Last BM: 12/14/2015  Assessment and Plan: 1. Renal hematoma, right The patient presents with complaints of right flank pain. Workup in the ER showed that he had developed right-sided hematoma. Urology was consulted and recommended to hold anticoagulation and continue to monitor the patient clinically. No acute surgical intervention required. H&H remained stable. We'll continue to monitor clinically. We will discuss with urology on Monday regarding discharge planning resumption of anticoagulation as well as clearance for cardiology procedure. Pain control as needed  2. 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 Baseline creatinine 4, presentation 5.4 with volume overload and evidence of uremia. Patient has been on IV Lasix as well as Zaroxolyn. So far -11 L in the hospital. His BUN is consistently climbing and serum creatinine remaining stable. With this patient was discussed with vascular surgery for vein mapping and AV fistula. Nephrology continues to follow up.  patient has been started on hemodialysis and tolerating it well  3. History of DVT with chronic anticoagulation. Presented with renal hematoma on the right side. H&H remains stable. INR has been reversed. 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 Given this factor patient is currently remaining off anticoagulation. Tomorrow will discuss with urology regarding resuming anticoagulation  4. Acute systolic CHF. Prior echocardiogram showed ejection fraction of 55-60%. This admission echo  program were showing 35-40%. Regional wall motion abnormalities were also seen. Cardiology was consulted and the patient was considered a poor candidate for cardiac intervention due to his renal failure. Recommendation is to stabilize his renal function and follow-up with cardiology for cardiac workup as an outpatient after patient is on hemodialysis. He is continue on IV Lasix as well as Zaroxolyn. Started on Coreg, continue amlodipine. Will discussed with cardiology after getting clearance from urology as well as nephrology  5. Normocytic anemia. H&H remaining 7.7.  Transfuse when necessary for hemoglobin less than 7  6. Type 2 diabetes mellitus. On Januvia at home. Currently here on sliding scale insulin. Sugars are better controlled. HbA1c on 11/2015 5.8  DVT Prophylaxis: mechanical compression device. Nutrition: Renal diet Advance goals of care discussion: Full code  Brief Summary of Hospitalization:  HPI: As per the H and P dictated on admission, "MAITLAND ENGELSTAD is a 70 y.o. male with a past medical history of chronic kidney disease unknown stage, history of bladder cancer, history of diabetes on oral agents, history of DVT on anticoagulation, who presented with complaint of pain in the right flank since yesterday evening at 6 PM. The pain was 10 out of 10 in intensity. It was sudden onset. No radiation of the pain. Was associated with vomiting. No precipitating aggravating or relieving factors. Denies any fever or chills. After getting pain medications, pain is better and down to 6 out of 10 in intensity. Denies any lightheadedness. No previous episodes of same. Denies any blood in the urine. No pain with urination.  In the emergency department he was found to have a right renal subcapsular hematoma with suspicion for bleeding into a renal cyst. He will be hospitalized for further management." Procedures:  Echocardiogram Consultants: Nephrology, vascular surgery,  cardiology Antibiotics: Anti-infectives    Start     Dose/Rate Route Frequency Ordered Stop   12/13/15 0800  ceFAZolin (ANCEF) IVPB 2 g/50 mL premix     2 g 100 mL/hr over 30 Minutes Intravenous  Once 12/13/15 0747 12/13/15 0747   12/13/15 0715  vancomycin (VANCOCIN) powder 1,000 mg     1,000 mg Other To Surgery 12/12/15 1040 12/14/15 0715      Family Communication: no family was present at bedside, at the time of interview.   Disposition:  Barriers to safe discharge: Tolerating hemodialysis.   Intake/Output Summary (Last 24 hours) at 12/15/15 1747 Last data filed at 12/15/15 0350  Gross per 24 hour  Intake    240 ml  Output    100 ml  Net    140 ml   Filed Weights   12/13/15 2100 12/14/15 0712 12/14/15 0938  Weight: 110.678 kg (244 lb) 110.6 kg (243 lb 13.3 oz) 108 kg (238 lb 1.6 oz)    Objective: Physical Exam: Filed Vitals:   12/14/15 1043 12/14/15 1658 12/14/15 2121 12/15/15 0442  BP: 127/63 128/72 133/63 121/77  Pulse: 78 79 83 76  Temp: 98.6 F (37 C) 97.7 F (36.5 C) 99 F (37.2 C) 98.5 F (36.9 C)  TempSrc: Oral Oral Oral Oral  Resp: 18 18 19 20   Height:      Weight:      SpO2: 98% 98% 95% 96%    General: Appear in no distress, no Rash; Oral Mucosa moist. Drowsy post procedure Cardiovascular: S1 and S2 Present, no Murmur, no JVD Respiratory: Bilateral Air entry present and Clear to Auscultation, no Crackles, no wheezes Abdomen: Bowel Sound present, Soft and no right flank tenderness Extremities: no Pedal edema, no calf tenderness  Data Reviewed: CBC:  Recent Labs Lab 12/11/15 0825 12/11/15 1156 12/12/15 0754 12/13/15 0500 12/14/15 0735  WBC 7.1 8.1 7.6 7.4 6.7  NEUTROABS 4.7  --  4.9  --   --   HGB 7.5* 7.5* 7.7* 7.7* 7.7*  HCT 23.1* 22.3* 23.3* 22.9* 22.4*  MCV 83.4 82.9 83.8 83.6 83.6  PLT 254 237 259 283 [id number]   Basic Metabolic Panel:  Recent Labs Lab 12/10/15 0705 12/11/15 0825 12/12/15 0754 12/13/15 0500 12/14/15 0735  NA 130*  131* 129* 127* 124*  K 3.6 3.7 4.2 4.2 3.9  CL 94* 94* 91* 88* 86*  CO2 23 23 25 24 23   GLUCOSE 129* 130* 153* 139* 249*  BUN 101* 103* 103* 105* 117*  CREATININE 8.11* 7.94* 7.76* 7.74* 7.63*  CALCIUM 8.5* 8.5* 8.7* 8.7* 9.2  MG 2.2  --  2.3  --   --   PHOS  --  5.9* 5.9* 5.9* 6.4*   Liver Function Tests:  Recent Labs Lab 12/11/15 0825 12/12/15 0754 12/13/15 0500 12/14/15 0735  ALBUMIN 2.7* 2.8* 2.8* 2.8*   No results for input(s): LIPASE, AMYLASE in the last 168 hours. No results for input(s): AMMONIA in the last 168 hours.  Cardiac Enzymes: No results for input(s): CKTOTAL, CKMB, CKMBINDEX, TROPONINI in the last 168 hours.  BNP (last 3 results)  Recent Labs  02/06/15 2204  BNP 53.4    CBG:  Recent Labs Lab 12/14/15 1628 12/14/15 2305 12/15/15 0731 12/15/15 1143 12/15/15 1626  GLUCAP 226* 186* 145* 130* 169*    Recent Results (from the past 240 hour(s))  Surgical pcr screen     Status: Abnormal   Collection Time: 12/13/15  5:49  AM  Result Value Ref Range Status   MRSA, PCR NEGATIVE NEGATIVE Final   Staphylococcus aureus POSITIVE (A) NEGATIVE Final    Comment:        The Xpert SA Assay (FDA approved for NASAL specimens in patients over 38 years of age), is one component of a comprehensive surveillance program.  Test performance has been validated by [facility] for patients greater than or equal to 85 year old. It is not intended to diagnose infection nor to guide or monitor treatment.      Studies: No results found.   Scheduled Meds: . amLODipine  10 mg Oral Daily  . atorvastatin  40 mg Oral q1800  . bisacodyl  10 mg Oral Daily  . calcitRIOL  0.5 mcg Oral Daily  . carvedilol  6.25 mg Oral BID WC  . darbepoetin (ARANESP) injection - NON-DIALYSIS  60 mcg Subcutaneous Q Mon-1800  . ferric gluconate (FERRLECIT/NULECIT) IV  125 mg Intravenous QODAY  . insulin aspart  0-9 Units Subcutaneous TID WC  . polyethylene glycol  17 g Oral Daily  .  potassium chloride  20 mEq Oral BID  . senna-docusate  1 tablet Oral BID  . sevelamer carbonate  1,600 mg Oral TID WC  . sodium chloride flush  3 mL Intravenous Q12H   Continuous Infusions: . sodium chloride 0  (12/08/15 1802)   PRN Meds: sodium chloride, sodium chloride, acetaminophen **OR** acetaminophen, albuterol, alteplase, alum & mag hydroxide-simeth, bisacodyl, cyclobenzaprine, famotidine, heparin, HYDROmorphone (DILAUDID) injection, lidocaine (PF), lidocaine-prilocaine, ondansetron **OR** ondansetron (ZOFRAN) IV, oxyCODONE, pentafluoroprop-tetrafluoroeth  Time spent: 30 minutes  Author: Berle Mull, MD [facility] Pager: [phone] 12/15/2015 5:47 PM  If 7PM-7AM, please contact night-coverage at [email or web address], password [facility]

## 2015-12-15 NOTE — Progress Notes (Signed)
[facility] ROUNDING NOTE   Subjective:   Interval History: no complaints today  Objective:  Vital signs in last 24 hours:  Temp:  [97.7 F (36.5 C)-99 F (37.2 C)] 98.5 F (36.9 C) (03/05 0442) Pulse Rate:  [76-83] 76 (03/05 0442) Resp:  [18-20] 20 (03/05 0442) BP: (121-133)/(63-77) 121/77 mmHg (03/05 0442) SpO2:  [95 %-98 %] 96 % (03/05 0442)  Weight change: -0.078 kg (-2.7 oz) Filed Weights   12/13/15 2100 12/14/15 0712 12/14/15 0938  Weight: 110.678 kg (244 lb) 110.6 kg (243 lb 13.3 oz) 108 kg (238 lb 1.6 oz)    Intake/Output: I/O last 3 completed shifts: In: 720 [P.O.:720] Out: 825 [Urine:825]   Intake/Output this shift:     CVS- RRR  AVF  Left antecubital  RS- CTA ABD- BS present soft non-distended EXT- no edema   Basic Metabolic Panel:  Recent Labs Lab 12/10/15 0705 12/11/15 0825 12/12/15 0754 12/13/15 0500 12/14/15 0735  NA 130* 131* 129* 127* 124*  K 3.6 3.7 4.2 4.2 3.9  CL 94* 94* 91* 88* 86*  CO2 23 23 25 24 23   GLUCOSE 129* 130* 153* 139* 249*  BUN 101* 103* 103* 105* 117*  CREATININE 8.11* 7.94* 7.76* 7.74* 7.63*  CALCIUM 8.5* 8.5* 8.7* 8.7* 9.2  MG 2.2  --  2.3  --   --   PHOS  --  5.9* 5.9* 5.9* 6.4*    Liver Function Tests:  Recent Labs Lab 12/11/15 0825 12/12/15 0754 12/13/15 0500 12/14/15 0735  ALBUMIN 2.7* 2.8* 2.8* 2.8*   No results for input(s): LIPASE, AMYLASE in the last 168 hours. No results for input(s): AMMONIA in the last 168 hours.  CBC:  Recent Labs Lab 12/11/15 0825 12/11/15 1156 12/12/15 0754 12/13/15 0500 12/14/15 0735  WBC 7.1 8.1 7.6 7.4 6.7  NEUTROABS 4.7  --  4.9  --   --   HGB 7.5* 7.5* 7.7* 7.7* 7.7*  HCT 23.1* 22.3* 23.3* 22.9* 22.4*  MCV 83.4 82.9 83.8 83.6 83.6  PLT 254 237 259 283 291    Cardiac Enzymes: No results for input(s): CKTOTAL, CKMB, CKMBINDEX, TROPONINI in the last 168 hours.  BNP: Invalid input(s): POCBNP  CBG:  Recent Labs Lab 12/14/15 1009  12/14/15 1628 12/14/15 2305 12/15/15 0731 12/15/15 1143  GLUCAP 163* 226* 186* 145* 130*    Microbiology: Results for orders placed or performed during the hospital encounter of 12/03/15  Urine culture     Status: None   Collection Time: 12/04/15  7:07 AM  Result Value Ref Range Status   Specimen Description URINE, CLEAN CATCH  Final   Special Requests NONE  Final   Culture   Final    NO GROWTH 1 DAY Performed at [facility]    Report Status 12/05/2015 FINAL  Final  Surgical pcr screen     Status: Abnormal   Collection Time: 12/13/15  5:49 AM  Result Value Ref Range Status   MRSA, PCR NEGATIVE NEGATIVE Final   Staphylococcus aureus POSITIVE (A) NEGATIVE Final    Comment:        The Xpert SA Assay (FDA approved for NASAL specimens in patients over 14 years of age), is one component of a comprehensive surveillance program.  Test performance has been validated by [facility] for patients greater than or equal to 46 year old. It is not intended to diagnose infection nor to guide or monitor treatment.     Coagulation Studies:  Recent Labs  12/13/15 0500  LABPROT 18.0*  INR 1.48    Urinalysis: No results for input(s): COLORURINE, LABSPEC, PHURINE, GLUCOSEU, HGBUR, BILIRUBINUR, KETONESUR, PROTEINUR, UROBILINOGEN, NITRITE, LEUKOCYTESUR in the last 72 hours.  Invalid input(s): APPERANCEUR    Imaging: No results found.   Medications:   . sodium chloride 0  (12/08/15 1802)   . amLODipine  10 mg Oral Daily  . atorvastatin  40 mg Oral q1800  . bisacodyl  10 mg Oral Daily  . calcitRIOL  0.5 mcg Oral Daily  . carvedilol  6.25 mg Oral BID WC  . darbepoetin (ARANESP) injection - NON-DIALYSIS  60 mcg Subcutaneous Q Mon-1800  . ferric gluconate (FERRLECIT/NULECIT) IV  125 mg Intravenous QODAY  . insulin aspart  0-9 Units Subcutaneous TID WC  . polyethylene glycol  17 g Oral Daily  . potassium chloride  20 mEq Oral BID  . senna-docusate  1 tablet Oral BID   . sevelamer carbonate  1,600 mg Oral TID WC  . sodium chloride flush  3 mL Intravenous Q12H   sodium chloride, sodium chloride, acetaminophen **OR** acetaminophen, albuterol, alteplase, alum & mag hydroxide-simeth, bisacodyl, cyclobenzaprine, famotidine, heparin, HYDROmorphone (DILAUDID) injection, lidocaine (PF), lidocaine-prilocaine, ondansetron **OR** ondansetron (ZOFRAN) IV, oxyCODONE, pentafluoroprop-tetrafluoroeth  Assessment/ Plan:  1 End stage Renal disease. Creat 7 recommend dialysis preparation with AVF placement  2 Pulm edema / LE edema - better IV lasix and metolazone 3 R flank pain/ subcapsular hematoma - reversed anticoagulation  4 HTN on norvasc/ coreg, bp's good 5 Anemia of CKD- Hb 7.1, Stable now with aranesp 6 Hx bladder cancer 7 Renal cysts, some complex lesions - needs MRI when more stable 8 Systolic CHF - no cath for now per cardiology; reassess  9 Constipated - dulcolax ordered 10.DVT - anticoagulation reversed     LOS: 11 Albert Stanley W @TODAY @11 :45 AM

## 2015-12-16 ENCOUNTER — Encounter (HOSPITAL_COMMUNITY): Payer: Self-pay | Admitting: Vascular Surgery

## 2015-12-16 ENCOUNTER — Inpatient Hospital Stay (HOSPITAL_COMMUNITY): Payer: Medicare Other

## 2015-12-16 DIAGNOSIS — R112 Nausea with vomiting, unspecified: Secondary | ICD-10-CM

## 2015-12-16 LAB — COMPREHENSIVE METABOLIC PANEL
ALBUMIN: 3 g/dL — AB (ref 3.5–5.0)
ALT: 15 U/L — ABNORMAL LOW (ref 17–63)
ANION GAP: 13 (ref 5–15)
AST: 24 U/L (ref 15–41)
Alkaline Phosphatase: 50 U/L (ref 38–126)
BILIRUBIN TOTAL: 0.6 mg/dL (ref 0.3–1.2)
BUN: 79 mg/dL — ABNORMAL HIGH (ref 6–20)
CALCIUM: 9.1 mg/dL (ref 8.9–10.3)
CO2: 24 mmol/L (ref 22–32)
Chloride: 95 mmol/L — ABNORMAL LOW (ref 101–111)
Creatinine, Ser: 6.45 mg/dL — ABNORMAL HIGH (ref 0.61–1.24)
GFR calc non Af Amer: 8 mL/min — ABNORMAL LOW (ref >60)
GFR, EST AFRICAN AMERICAN: 9 mL/min — AB (ref >60)
GLUCOSE: 152 mg/dL — AB (ref 65–99)
POTASSIUM: 4.5 mmol/L (ref 3.5–5.1)
SODIUM: 132 mmol/L — AB (ref 135–145)
TOTAL PROTEIN: 5.9 g/dL — AB (ref 6.5–8.1)

## 2015-12-16 LAB — CBC WITH DIFFERENTIAL/PLATELET
BASOS ABS: 0 10*3/uL (ref 0.0–0.1)
BASOS PCT: 0 %
Eosinophils Absolute: 0.1 10*3/uL (ref 0.0–0.7)
Eosinophils Relative: 1 %
HEMATOCRIT: 23.3 % — AB (ref 39.0–52.0)
Hemoglobin: 7.8 g/dL — ABNORMAL LOW (ref 13.0–17.0)
LYMPHS PCT: 13 %
Lymphs Abs: 1.1 10*3/uL (ref 0.7–4.0)
MCH: 28.6 pg (ref 26.0–34.0)
MCHC: 33.5 g/dL (ref 30.0–36.0)
MCV: 85.3 fL (ref 78.0–100.0)
Monocytes Absolute: 0.5 10*3/uL (ref 0.1–1.0)
Monocytes Relative: 7 %
NEUTROS ABS: 6.5 10*3/uL (ref 1.7–7.7)
NEUTROS PCT: 79 %
Platelets: 304 10*3/uL (ref 150–400)
RBC: 2.73 MIL/uL — AB (ref 4.22–5.81)
RDW: 14.9 % (ref 11.5–15.5)
WBC: 8.2 10*3/uL (ref 4.0–10.5)

## 2015-12-16 LAB — GLUCOSE, CAPILLARY
GLUCOSE-CAPILLARY: 113 mg/dL — AB (ref 65–99)
GLUCOSE-CAPILLARY: 145 mg/dL — AB (ref 65–99)
Glucose-Capillary: 132 mg/dL — ABNORMAL HIGH (ref 65–99)
Glucose-Capillary: 135 mg/dL — ABNORMAL HIGH (ref 65–99)

## 2015-12-16 LAB — LACTIC ACID, PLASMA
LACTIC ACID, VENOUS: 0.7 mmol/L (ref 0.5–2.0)
Lactic Acid, Venous: 0.6 mmol/L (ref 0.5–2.0)

## 2015-12-16 LAB — LIPASE, BLOOD: LIPASE: 45 U/L (ref 11–51)

## 2015-12-16 MED ORDER — DARBEPOETIN ALFA 60 MCG/0.3ML IJ SOSY
60.0000 ug | PREFILLED_SYRINGE | INTRAMUSCULAR | Status: DC
  Administered 2015-12-17: 60 ug via INTRAVENOUS
  Filled 2015-12-16: qty 0.3

## 2015-12-16 NOTE — Progress Notes (Addendum)
[facility] ROUNDING NOTE   Subjective:   Interval History:  No new issues  Objective:  Vital signs in last 24 hours:  Temp:  [98.2 F (36.8 C)-99.5 F (37.5 C)] 98.6 F (37 C) (03/06 1000) Pulse Rate:  [81-85] 85 (03/06 1000) Resp:  [18] 18 (03/06 1000) BP: (117-143)/(63-74) 143/74 mmHg (03/06 1000) SpO2:  [93 %-96 %] 96 % (03/06 1000) Weight:  [107.5 kg (236 lb 15.9 oz)] 107.5 kg (236 lb 15.9 oz) (03/05 2030)  Weight change: -3.1 kg (-6 lb 13.4 oz) Filed Weights   12/14/15 0712 12/14/15 0938 12/15/15 2030  Weight: 110.6 kg (243 lb 13.3 oz) 108 kg (238 lb 1.6 oz) 107.5 kg (236 lb 15.9 oz)    Intake/Output: I/O last 3 completed shifts: In: 360 [P.O.:360] Out: 100 [Urine:100]   Intake/Output this shift:  Total I/O In: 480 [P.O.:480] Out: -   CVS- Regular S1S2 No S3   L AVF + bruit Lungs clear ABD- BS present soft non-distended EXT- no edema of LE's [facility] R chest    Basic Metabolic Panel:  Recent Labs Lab 12/10/15 0705 12/11/15 0825 12/12/15 0754 12/13/15 0500 12/14/15 0735  NA 130* 131* 129* 127* 124*  K 3.6 3.7 4.2 4.2 3.9  CL 94* 94* 91* 88* 86*  CO2 23 23 25 24 23   GLUCOSE 129* 130* 153* 139* 249*  BUN 101* 103* 103* 105* 117*  CREATININE 8.11* 7.94* 7.76* 7.74* 7.63*  CALCIUM 8.5* 8.5* 8.7* 8.7* 9.2  MG 2.2  --  2.3  --   --   PHOS  --  5.9* 5.9* 5.9* 6.4*      Recent Labs Lab 12/11/15 0825 12/12/15 0754 12/13/15 0500 12/14/15 0735  ALBUMIN 2.7* 2.8* 2.8* 2.8*    CBC:  Recent Labs Lab 12/11/15 0825 12/11/15 1156 12/12/15 0754 12/13/15 0500 12/14/15 0735 12/16/15 0354  WBC 7.1 8.1 7.6 7.4 6.7 8.2  NEUTROABS 4.7  --  4.9  --   --  6.5  HGB 7.5* 7.5* 7.7* 7.7* 7.7* 7.8*  HCT 23.1* 22.3* 23.3* 22.9* 22.4* 23.3*  MCV 83.4 82.9 83.8 83.6 83.6 85.3  PLT 254 237 259 283 291 304     CBG:  Recent Labs Lab 12/15/15 1143 12/15/15 1626 12/15/15 2029 12/16/15 0741 12/16/15 1151  GLUCAP 130* 169* 176* 132* 113*      Medications:   . sodium chloride 0  (12/08/15 1802)   . amLODipine  10 mg Oral Daily  . atorvastatin  40 mg Oral q1800  . bisacodyl  10 mg Oral Daily  . calcitRIOL  0.5 mcg Oral Daily  . carvedilol  6.25 mg Oral BID WC  . darbepoetin (ARANESP) injection - NON-DIALYSIS  60 mcg Subcutaneous Q Mon-1800  . ferric gluconate (FERRLECIT/NULECIT) IV  125 mg Intravenous QODAY  . insulin aspart  0-9 Units Subcutaneous TID WC  . polyethylene glycol  17 g Oral Daily  . potassium chloride  20 mEq Oral BID  . senna-docusate  1 tablet Oral BID  . sevelamer carbonate  1,600 mg Oral TID WC  . sodium chloride flush  3 mL Intravenous Q12H   sodium chloride, sodium chloride, acetaminophen **OR** acetaminophen, albuterol, alteplase, alum & mag hydroxide-simeth, bisacodyl, cyclobenzaprine, famotidine, heparin, HYDROmorphone (DILAUDID) injection, lidocaine (PF), lidocaine-prilocaine, ondansetron **OR** ondansetron (ZOFRAN) IV, oxyCODONE, pentafluoroprop-tetrafluoroeth  Background: 70 yo presented  w/flank pain, spontaneous right renal hematoma. Anticoagulation (prior DVT) reversed. Background of DM, systolic CHF (EF [id number] this adm), DM, anemia, and AKI on CKD4 ->  new ESRD.  Volume overloaded and uremic - HD intiated.   Assessment/ Plan:  1. New end stage Renal disease. New L BC AVF and R IJ TDC done 3/3. CLIP process started. Had 1st HD 3/4, next HD tomorrow. 2. Pulm edema / LE edema - continue to remove volume with HD 3. R flank pain/ subcapsular hematoma - reversed anticoagulation  4. HTN on norvasc/ coreg, bp's good 5. Anemia of CKD- Hb 7.1, On Aranesp. TSat 8 2/25. Fe load ongoing. 6. Hx bladder cancer 7. Renal cysts, some complex lesions - needs MRI when more stable 8. Systolic CHF - no cath for now per cardiology; reassess  9. 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 No PTH data. Sevelamer for elev phos  Jamal Maes, MD [facility] [phone]  Pager 12/16/2015, 3:35 PM

## 2015-12-16 NOTE — Progress Notes (Signed)
[facility] Progress Note  Patient: Albert Stanley   PCP: [name], MD DOB: [birth year]   DOA: 12/03/2015   DOS: 12/16/2015   Date of Service: the patient was seen and examined on 12/16/2015  Subjective: Patient has been having complaints of nausea and vomiting so far 3 episodes. No abdominal pain. No diarrhea. Nutrition: Tolerating oral diet Activity: Walking in the room Last BM: 12/14/2015  Assessment and Plan: 1. Renal hematoma, right The patient presents with complaints of right flank pain. Workup in the ER showed that he had developed right-sided hematoma. Urology was consulted and recommended to hold anticoagulation and continue to monitor the patient clinically. No acute surgical intervention required. H&H remained stable. We'll continue to monitor clinically. Urology will follow-up for the formal consult regarding, resumption of anticoagulation as well as clearance for cardiology procedure. Pain control as needed  2. 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 Intractable nausea and vomiting Baseline creatinine 4, presentation 5.4 with volume overload and evidence of uremia. Patient has been on IV Lasix as well as Zaroxolyn. So far -11 L in the hospital. His BUN is consistently climbing and serum creatinine remaining stable. With this patient was discussed with vascular surgery for vein mapping and AV fistula. Nephrology continues to follow up.  patient has been started on hemodialysis and tolerating it well His nausea and vomiting is likely secondary to uremia.  3. History of DVT with chronic anticoagulation. Presented with renal hematoma on the right side. H&H remains stable. INR has been reversed. 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 Given this factor patient is currently remaining off anticoagulation. Urology consulted for recommendation regarding anticoagulation  4. Acute systolic  CHF. Prior echocardiogram showed ejection fraction of 55-60%. This admission echo program were showing 35-40%. Regional wall motion abnormalities were also seen. Cardiology was consulted and the patient was considered a poor candidate for cardiac intervention due to his renal failure. Recommendation is to stabilize his renal function and follow-up with cardiology for cardiac workup as an outpatient after patient is on hemodialysis. He is continue on IV Lasix as well as Zaroxolyn. Started on Coreg, continue amlodipine. Awaiting clearance from neurology before consulting with cardiology again  5. Normocytic anemia. H&H remaining 7.7.  Transfuse when necessary for hemoglobin less than 7  6. Type 2 diabetes mellitus. On Januvia at home. Currently here on sliding scale insulin. Sugars are better controlled. HbA1c on 11/2015 5.8  DVT Prophylaxis: mechanical compression device. Nutrition: Renal diet Advance goals of care discussion: Full code  Brief Summary of Hospitalization:  HPI: As per the H and P dictated on admission, "Albert Stanley is a 70 y.o. male with a past medical history of chronic kidney disease unknown stage, history of bladder cancer, history of diabetes on oral agents, history of DVT on anticoagulation, who presented with complaint of pain in the right flank since yesterday evening at 6 PM. The pain was 10 out of 10 in intensity. It was sudden onset. No radiation of the pain. Was associated with vomiting. No precipitating aggravating or relieving factors. Denies any fever or chills. After getting pain medications, pain is better and down to 6 out of 10 in intensity. Denies any lightheadedness. No previous episodes of same. Denies any blood in the urine. No pain with urination.  In the emergency department he was found to have a right renal subcapsular hematoma with suspicion for bleeding into a renal cyst. He  will be hospitalized for further management." Procedures:  Echocardiogram Consultants: Nephrology, vascular surgery, cardiology Antibiotics: Anti-infectives    Start     Dose/Rate Route Frequency Ordered Stop   12/13/15 0800  ceFAZolin (ANCEF) IVPB 2 g/50 mL premix     2 g 100 mL/hr over 30 Minutes Intravenous  Once 12/13/15 0747 12/13/15 0747   12/13/15 0715  vancomycin (VANCOCIN) powder 1,000 mg     1,000 mg Other To Surgery 12/12/15 1040 12/14/15 0715      Family Communication: no family was present at bedside, at the time of interview.   Disposition:  Barriers to safe discharge: Tolerating hemodialysis.   Intake/Output Summary (Last 24 hours) at 12/16/15 1612 Last data filed at 12/16/15 1258  Gross per 24 hour  Intake    600 ml  Output      0 ml  Net    600 ml   Filed Weights   12/14/15 0712 12/14/15 0938 12/15/15 2030  Weight: 110.6 kg (243 lb 13.3 oz) 108 kg (238 lb 1.6 oz) 107.5 kg (236 lb 15.9 oz)    Objective: Physical Exam: Filed Vitals:   12/15/15 0442 12/15/15 2030 12/16/15 0421 12/16/15 1000  BP: 121/77 117/70 132/63 143/74  Pulse: 76 81 84 85  Temp: 98.5 F (36.9 C) 98.2 F (36.8 C) 99.5 F (37.5 C) 98.6 F (37 C)  TempSrc: Oral Oral Oral Oral  Resp: 20 18 18 18   Height:      Weight:  107.5 kg (236 lb 15.9 oz)    SpO2: 96% 96% 93% 96%    General: Appear in no distress, no Rash; Oral Mucosa moist. Drowsy post procedure Cardiovascular: S1 and S2 Present, no Murmur, no JVD Respiratory: Bilateral Air entry present and Clear to Auscultation, no Crackles, no wheezes Abdomen: Bowel Sound present, Soft and no tenderness Extremities: no Pedal edema, no calf tenderness  Data Reviewed: CBC:  Recent Labs Lab 12/11/15 0825 12/11/15 1156 12/12/15 0754 12/13/15 0500 12/14/15 0735 12/16/15 0354  WBC 7.1 8.1 7.6 7.4 6.7 8.2  NEUTROABS 4.7  --  4.9  --   --  6.5  HGB 7.5* 7.5* 7.7* 7.7* 7.7* 7.8*  HCT 23.1* 22.3* 23.3* 22.9* 22.4* 23.3*  MCV 83.4 82.9 83.8 83.6 83.6 85.3  PLT 254 237 259 283 [id number]   Basic  Metabolic Panel:  Recent Labs Lab 12/10/15 0705 12/11/15 0825 12/12/15 0754 12/13/15 0500 12/14/15 0735  NA 130* 131* 129* 127* 124*  K 3.6 3.7 4.2 4.2 3.9  CL 94* 94* 91* 88* 86*  CO2 23 23 25 24 23   GLUCOSE 129* 130* 153* 139* 249*  BUN 101* 103* 103* 105* 117*  CREATININE 8.11* 7.94* 7.76* 7.74* 7.63*  CALCIUM 8.5* 8.5* 8.7* 8.7* 9.2  MG 2.2  --  2.3  --   --   PHOS  --  5.9* 5.9* 5.9* 6.4*   Liver Function Tests:  Recent Labs Lab 12/11/15 0825 12/12/15 0754 12/13/15 0500 12/14/15 0735  ALBUMIN 2.7* 2.8* 2.8* 2.8*   No results for input(s): LIPASE, AMYLASE in the last 168 hours. No results for input(s): AMMONIA in the last 168 hours.  Cardiac Enzymes: No results for input(s): CKTOTAL, CKMB, CKMBINDEX, TROPONINI in the last 168 hours.  BNP (last 3 results)  Recent Labs  02/06/15 2204  BNP 53.4    CBG:  Recent Labs Lab 12/15/15 1143 12/15/15 1626 12/15/15 2029 12/16/15 0741 12/16/15 1151  GLUCAP 130* 169* 176* 132* 113*    Recent Results (  from the past 240 hour(s))  Surgical pcr screen     Status: Abnormal   Collection Time: 12/13/15  5:49 AM  Result Value Ref Range Status   MRSA, PCR NEGATIVE NEGATIVE Final   Staphylococcus aureus POSITIVE (A) NEGATIVE Final    Comment:        The Xpert SA Assay (FDA approved for NASAL specimens in patients over 80 years of age), is one component of a comprehensive surveillance program.  Test performance has been validated by [facility] for patients greater than or equal to 3 year old. It is not intended to diagnose infection nor to guide or monitor treatment.      Studies: Dg Abd Portable 1v  12/16/2015  CLINICAL DATA:  Generalized abdominal pain, nausea, vomiting. EXAM: PORTABLE ABDOMEN - 1 VIEW COMPARISON:  None. FINDINGS: The bowel gas pattern is normal. No radio-opaque calculi or other significant radiographic abnormality are seen. Status post left hip arthroplasty. IMPRESSION: No evidence of  bowel obstruction or ileus. Electronically Signed   By: Marijo Conception, M.D.   On: 12/16/2015 15:46     Scheduled Meds: . amLODipine  10 mg Oral Daily  . atorvastatin  40 mg Oral q1800  . bisacodyl  10 mg Oral Daily  . calcitRIOL  0.5 mcg Oral Daily  . carvedilol  6.25 mg Oral BID WC  . [START ON 12/17/2015] darbepoetin (ARANESP) injection - DIALYSIS  60 mcg Intravenous Q Tue-HD  . ferric gluconate (FERRLECIT/NULECIT) IV  125 mg Intravenous QODAY  . insulin aspart  0-9 Units Subcutaneous TID WC  . polyethylene glycol  17 g Oral Daily  . potassium chloride  20 mEq Oral BID  . senna-docusate  1 tablet Oral BID  . sevelamer carbonate  1,600 mg Oral TID WC  . sodium chloride flush  3 mL Intravenous Q12H   Continuous Infusions: . sodium chloride 0  (12/08/15 1802)   PRN Meds: sodium chloride, sodium chloride, acetaminophen **OR** acetaminophen, albuterol, alteplase, bisacodyl, cyclobenzaprine, famotidine, heparin, HYDROmorphone (DILAUDID) injection, lidocaine (PF), lidocaine-prilocaine, ondansetron **OR** ondansetron (ZOFRAN) IV, oxyCODONE, pentafluoroprop-tetrafluoroeth  Time spent: 30 minutes  Author: Berle Mull, MD [facility] Pager: [phone] 12/16/2015 4:12 PM  If 7PM-7AM, please contact night-coverage at [email or web address], password [email or web address]

## 2015-12-16 NOTE — Care Management Important Message (Signed)
Important Message  Patient Details  Name: Albert Stanley MRN: [id number] Date of Birth: 03-Oct-1946   Medicare Important Message Given:  Yes    Barb Merino [name] 12/16/2015, 3:15 PM

## 2015-12-16 NOTE — Consult Note (Signed)
Urology Consult   Physician requesting consult: Dr. Posey Pronto  Reason for consult: Right subcapsular renal hematoma  History of Present Illness: Albert Stanley is a 70 y.o. followed by Dr. Risa Grill for a history of urothelial carcinoma of the bladder.  He has been undergoing annual cystoscopic surveillance without recent recurrences.  He presented to the hospital on 12/04/15 with acute onset of severe right flank pain.  CT imaging without contrast revealed high density areas in the subcapsular space of the right kidney consistent with a right renal subcapsular hematoma.  He is on warfarin chronically for a history of recurrent DVT (one time back in the 1980s spontaneously and one time after hip replacement in 2011).  However, his INR was only 1.53 when he presented to the hospital and it does not appear that he had been supratherapeutic recently.  Dr. Matilde Sprang was apparently consulted by phone although no official urology consult was performed.  The patient has remained hemodynamically stable and has not required transfusion or intervention.  He has denied hematuria. He currently denies flank pain.  His anticoagulation has been held.  His last INR was 1.48 on 3/3 which interestingly is pretty much the same as it was when he was admitted.  His medical situation is complicated by progressive chronic kidney disease. An A-V fistula was performed last week in preparation for the likely need for chronic dialysis in the future (he received first HD treatment on 3/4).  He is chronically anemic due to his renal failure with a Hgb around 7.7.  He also has CHF with his recent echocardiogram indicating an EF of 35-40%.   Past Medical History  Diagnosis Date  . Hypertension   . Diabetes mellitus without complication ([place])   . Asthma   . Hyperlipidemia   . DVT (deep venous thrombosis) ([place])   . Renal insufficiency   . Bladder cancer ([place]) dx'd 1990    surg only    Past Surgical History  Procedure Laterality  Date  . Back surgery      2 times  . Joint replacement      hip  . Arthroscopic knee surgery    . Cystoscopy    . Av fistula placement Left 12/13/2015    Procedure: ARTERIOVENOUS (AV) FISTULA CREATION;  Surgeon: Angelia Mould, MD;  Location: [facility];  Service: Vascular;  Laterality: Left;  . Insertion of dialysis catheter N/A 12/13/2015    Procedure: INSERTION OF DIALYSIS CATHETER;  Surgeon: Angelia Mould, MD;  Location: [facility];  Service: Vascular;  Laterality: N/A;    Current Hospital Medications:  Home Meds:    Medication List    ASK your doctor about these medications        allopurinol 300 MG tablet  Commonly known as:  ZYLOPRIM  Take 150 mg by mouth daily.     amLODipine 10 MG tablet  Commonly known as:  NORVASC  Take 10 mg by mouth daily.     calcitRIOL 0.5 MCG capsule  Commonly known as:  ROCALTROL  Take 0.5 mcg by mouth daily.     cloNIDine 0.1 MG tablet  Commonly known as:  CATAPRES  Take 1 tablet (0.1 mg total) by mouth every 8 (eight) hours as needed (withdrawl symptoms).     cyclobenzaprine 5 MG tablet  Commonly known as:  FLEXERIL  Take 5 mg by mouth 3 (three) times daily as needed for muscle spasms.     doxazosin 4 MG tablet  Commonly known as:  CARDURA  Take 4 mg by mouth daily.     ferrous sulfate 325 (65 FE) MG tablet  Take 325 mg by mouth daily with breakfast.     fluticasone 50 MCG/ACT nasal spray  Commonly known as:  FLONASE  Place 1 spray into both nostrils daily as needed for allergies or rhinitis.     furosemide 80 MG tablet  Commonly known as:  LASIX  Take 80 mg by mouth daily.     glipiZIDE 5 MG 24 hr tablet  Commonly known as:  GLUCOTROL XL  Take 5 mg by mouth daily.     indapamide 1.25 MG tablet  Commonly known as:  LOZOL  Take 1.25 mg by mouth every morning.     JANUVIA 50 MG tablet  Generic drug:  sitaGLIPtin  Take 25 mg by mouth daily.     ondansetron 8 MG disintegrating tablet  Commonly known as:  ZOFRAN ODT   Take 1 tablet (8 mg total) by mouth every 8 (eight) hours as needed for nausea or vomiting.     oxyCODONE-acetaminophen 5-325 MG tablet  Commonly known as:  PERCOCET/ROXICET  Take 1 tablet by mouth every 6 (six) hours as needed for severe pain.     oxyCODONE-acetaminophen 7.5-325 MG tablet  Commonly known as:  PERCOCET  Take 1 tablet by mouth every 8 (eight) hours as needed. For pain     oxymetazoline 0.05 % nasal spray  Commonly known as:  AFRIN  Place 1 spray into both nostrils 2 (two) times daily as needed for congestion.     rosuvastatin 5 MG tablet  Commonly known as:  CRESTOR  Take 5 mg by mouth daily.     warfarin 5 MG tablet  Commonly known as:  COUMADIN  Take 2.5-5 mg by mouth daily at 6 PM. Taking 5 on Monday, Tuesday, Wednesday, Thursday and Friday and take 2.5mg  on Saturday and Sunday     zolpidem 5 MG tablet  Commonly known as:  AMBIEN  Take 1 tablet (5 mg total) by mouth at bedtime as needed for sleep.        Scheduled Meds: . amLODipine  10 mg Oral Daily  . atorvastatin  40 mg Oral q1800  . bisacodyl  10 mg Oral Daily  . calcitRIOL  0.5 mcg Oral Daily  . carvedilol  6.25 mg Oral BID WC  . [START ON 12/17/2015] darbepoetin (ARANESP) injection - DIALYSIS  60 mcg Intravenous Q Tue-HD  . ferric gluconate (FERRLECIT/NULECIT) IV  125 mg Intravenous QODAY  . insulin aspart  0-9 Units Subcutaneous TID WC  . polyethylene glycol  17 g Oral Daily  . potassium chloride  20 mEq Oral BID  . senna-docusate  1 tablet Oral BID  . sevelamer carbonate  1,600 mg Oral TID WC  . sodium chloride flush  3 mL Intravenous Q12H   Continuous Infusions: . sodium chloride 0  (12/08/15 1802)   PRN Meds:.sodium chloride, sodium chloride, acetaminophen **OR** acetaminophen, albuterol, alteplase, bisacodyl, cyclobenzaprine, famotidine, heparin, HYDROmorphone (DILAUDID) injection, lidocaine (PF), lidocaine-prilocaine, ondansetron **OR** ondansetron (ZOFRAN) IV, oxyCODONE,  pentafluoroprop-tetrafluoroeth  Allergies:  Allergies  Allergen Reactions  . Meperidine Hcl     Unknown  . Penicillins     "Knocks me out."   Has patient had a PCN reaction causing immediate rash, facial/tongue/throat swelling, SOB or lightheadedness with hypotension: yes- blacked out Has patient had a PCN reaction causing severe rash involving mucus membranes or skin necrosis: unknown Has patient had a PCN reaction that required hospitalization:  at MD office Has patient had a PCN reaction occurring within the last 10 years: no If all of the above answers are "NO", then may proceed with Cephalosporin use.   . Sulfonamide Derivatives     Unknown.   . Povidone-Iodine Rash    Family History  Problem Relation Age of Onset  . Arthritis Mother   . Cancer Father   . Heart disease Father   . Hypertension Sister   . Alcohol abuse Brother   . Diabetes Daughter     Social History:  reports that he has quit smoking. He has never used smokeless tobacco. He reports that he does not drink alcohol or use illicit drugs.  ROS: A complete review of systems was performed.  All systems are negative except for pertinent findings as noted.  Physical Exam:  Vital signs in last 24 hours: Temp:  [98.2 F (36.8 C)-99.5 F (37.5 C)] 98.2 F (36.8 C) (03/06 1638) Pulse Rate:  [81-85] 82 (03/06 1638) Resp:  [18] 18 (03/06 1638) BP: (117-143)/(63-74) 120/72 mmHg (03/06 1638) SpO2:  [93 %-96 %] 96 % (03/06 1638) Weight:  [107.5 kg (236 lb 15.9 oz)] 107.5 kg (236 lb 15.9 oz) (03/05 2030) Constitutional:  Alert and oriented, No acute distress Cardiovascular: Regular rate and rhythm, No JVD Respiratory: Normal respiratory effort, Lungs clear bilaterally GI: Abdomen is soft, nontender, nondistended, no abdominal masses GU: No CVA tenderness Lymphatic: No lymphadenopathy Neurologic: Grossly intact, no focal deficits Psychiatric: Normal mood and affect  Laboratory Data:   Recent Labs   12/14/15 0735 12/16/15 0354  WBC 6.7 8.2  HGB 7.7* 7.8*  HCT 22.4* 23.3*  PLT 291 304     Recent Labs  12/14/15 0735 12/16/15 1700  NA 124* 132*  K 3.9 4.5  CL 86* 95*  GLUCOSE 249* 152*  BUN 117* 79*  CALCIUM 9.2 9.1  CREATININE 7.63* 6.45*     Results for orders placed or performed during the hospital encounter of 12/03/15 (from the past 24 hour(s))  Glucose, capillary     Status: Abnormal   Collection Time: 12/15/15  8:29 PM  Result Value Ref Range   Glucose-Capillary 176 (H) 65 - 99 mg/dL  CBC with Differential/Platelet     Status: Abnormal   Collection Time: 12/16/15  3:54 AM  Result Value Ref Range   WBC 8.2 4.0 - 10.5 K/uL   RBC 2.73 (L) 4.22 - 5.81 MIL/uL   Hemoglobin 7.8 (L) 13.0 - 17.0 g/dL   HCT 23.3 (L) 39.0 - 52.0 %   MCV 85.3 78.0 - 100.0 fL   MCH 28.6 26.0 - 34.0 pg   MCHC 33.5 30.0 - 36.0 g/dL   RDW 14.9 11.5 - 15.5 %   Platelets 304 150 - 400 K/uL   Neutrophils Relative % 79 %   Neutro Abs 6.5 1.7 - 7.7 K/uL   Lymphocytes Relative 13 %   Lymphs Abs 1.1 0.7 - 4.0 K/uL   Monocytes Relative 7 %   Monocytes Absolute 0.5 0.1 - 1.0 K/uL   Eosinophils Relative 1 %   Eosinophils Absolute 0.1 0.0 - 0.7 K/uL   Basophils Relative 0 %   Basophils Absolute 0.0 0.0 - 0.1 K/uL  Glucose, capillary     Status: Abnormal   Collection Time: 12/16/15  7:41 AM  Result Value Ref Range   Glucose-Capillary 132 (H) 65 - 99 mg/dL  Glucose, capillary     Status: Abnormal   Collection Time: 12/16/15 11:51 AM  Result Value  Ref Range   Glucose-Capillary 113 (H) 65 - 99 mg/dL  Glucose, capillary     Status: Abnormal   Collection Time: 12/16/15  4:37 PM  Result Value Ref Range   Glucose-Capillary 135 (H) 65 - 99 mg/dL  Lipase, blood     Status: None   Collection Time: 12/16/15  5:00 PM  Result Value Ref Range   Lipase 45 11 - 51 U/L  Lactic acid, plasma     Status: None   Collection Time: 12/16/15  5:00 PM  Result Value Ref Range   Lactic Acid, Venous 0.7 0.5 -  2.0 mmol/L  Comprehensive metabolic panel     Status: Abnormal   Collection Time: 12/16/15  5:00 PM  Result Value Ref Range   Sodium 132 (L) 135 - 145 mmol/L   Potassium 4.5 3.5 - 5.1 mmol/L   Chloride 95 (L) 101 - 111 mmol/L   CO2 24 22 - 32 mmol/L   Glucose, Bld 152 (H) 65 - 99 mg/dL   BUN 79 (H) 6 - 20 mg/dL   Creatinine, Ser 6.45 (H) 0.61 - 1.24 mg/dL   Calcium 9.1 8.9 - 10.3 mg/dL   Total Protein 5.9 (L) 6.5 - 8.1 g/dL   Albumin 3.0 (L) 3.5 - 5.0 g/dL   AST 24 15 - 41 U/L   ALT 15 (L) 17 - 63 U/L   Alkaline Phosphatase 50 38 - 126 U/L   Total Bilirubin 0.6 0.3 - 1.2 mg/dL   GFR calc non Af Amer 8 (L) >60 mL/min   GFR calc Af Amer 9 (L) >60 mL/min   Anion gap 13 5 - 15  Lactic acid, plasma     Status: None   Collection Time: 12/16/15  6:32 PM  Result Value Ref Range   Lactic Acid, Venous 0.6 0.5 - 2.0 mmol/L   Recent Results (from the past 240 hour(s))  Surgical pcr screen     Status: Abnormal   Collection Time: 12/13/15  5:49 AM  Result Value Ref Range Status   MRSA, PCR NEGATIVE NEGATIVE Final   Staphylococcus aureus POSITIVE (A) NEGATIVE Final    Comment:        The Xpert SA Assay (FDA approved for NASAL specimens in patients over 56 years of age), is one component of a comprehensive surveillance program.  Test performance has been validated by [facility] for patients greater than or equal to 16 year old. It is not intended to diagnose infection nor to guide or monitor treatment.     Renal Function:  Recent Labs  12/10/15 0705 12/11/15 0825 12/12/15 0754 12/13/15 0500 12/14/15 0735 12/16/15 1700  CREATININE 8.11* 7.94* 7.76* 7.74* 7.63* 6.45*   Estimated Creatinine Clearance: 13.3 mL/min (by C-G formula based on Cr of 6.45).  Radiologic Imaging: CT scan from 12/04/15.  I independently reviewed the above imaging studies.  There was a small right subcapsular hematoma and multiple high density renal lesions consistent with probable hemorrhagic  cystic lesions although no contrast was administered thereby not allowing full characterization of these lesions.  Impression/Recommendation 1) Right subcapsular hematoma / complex renal cystic lesions: I was consulted today about the safety of restarting the patient's anticoagulation.  It is unclear why he initially bled considering that he was subtherapeutic on anticoagulation.  He likely will need repeat imaging sometime in the next 3 months which can be performed as an outpatient with Dr. Risa Grill to further evaluation his renal lesions and to ensure resolution of his hematoma.  If he is definitely dialysis dependent at that time, he could also undergo contrast imaging to better characterize his renal lesions and help to rule out malignancy.   With regard to restarting his anticoagulation, I first question the real benefit of chronic anticoagulation considering that his DVT in 2011 was after hip replacement surgery and the event in the 1980s is very vague with the patient providing limited history.  It may be beneficial to seek an opinion from Hematology to determine the risk/benefit considering his recent bleed.  If felt to be at high risk for a recurrent thromboembolic event that would indicate he continue with anticoagulation, this could carefully be restarted under close surveillance with serial Hgb measurements especially since patient is so anemic at baseline.  I will notify Dr. Risa Grill of this consult so he can continue follow up management.  Jhade Berko,LES 12/16/2015, 8:12 PM    Pryor Curia MD   CC: Dr. Posey Pronto

## 2015-12-17 ENCOUNTER — Telehealth: Payer: Self-pay | Admitting: Vascular Surgery

## 2015-12-17 LAB — CBC
HEMATOCRIT: 24.5 % — AB (ref 39.0–52.0)
HEMOGLOBIN: 8.2 g/dL — AB (ref 13.0–17.0)
MCH: 29 pg (ref 26.0–34.0)
MCHC: 33.5 g/dL (ref 30.0–36.0)
MCV: 86.6 fL (ref 78.0–100.0)
Platelets: 295 10*3/uL (ref 150–400)
RBC: 2.83 MIL/uL — AB (ref 4.22–5.81)
RDW: 15 % (ref 11.5–15.5)
WBC: 8.2 10*3/uL (ref 4.0–10.5)

## 2015-12-17 LAB — RENAL FUNCTION PANEL
ANION GAP: 12 (ref 5–15)
Albumin: 2.9 g/dL — ABNORMAL LOW (ref 3.5–5.0)
BUN: 81 mg/dL — ABNORMAL HIGH (ref 6–20)
CHLORIDE: 96 mmol/L — AB (ref 101–111)
CO2: 23 mmol/L (ref 22–32)
CREATININE: 6.37 mg/dL — AB (ref 0.61–1.24)
Calcium: 8.8 mg/dL — ABNORMAL LOW (ref 8.9–10.3)
GFR, EST AFRICAN AMERICAN: 9 mL/min — AB (ref >60)
GFR, EST NON AFRICAN AMERICAN: 8 mL/min — AB (ref >60)
Glucose, Bld: 175 mg/dL — ABNORMAL HIGH (ref 65–99)
POTASSIUM: 4.4 mmol/L (ref 3.5–5.1)
Phosphorus: 4.2 mg/dL (ref 2.5–4.6)
Sodium: 131 mmol/L — ABNORMAL LOW (ref 135–145)

## 2015-12-17 LAB — GLUCOSE, CAPILLARY
GLUCOSE-CAPILLARY: 121 mg/dL — AB (ref 65–99)
Glucose-Capillary: 177 mg/dL — ABNORMAL HIGH (ref 65–99)
Glucose-Capillary: 137 mg/dL — ABNORMAL HIGH (ref 65–99)

## 2015-12-17 MED ORDER — DARBEPOETIN ALFA 60 MCG/0.3ML IJ SOSY
PREFILLED_SYRINGE | INTRAMUSCULAR | Status: AC
  Filled 2015-12-17: qty 0.3

## 2015-12-17 MED ORDER — ALUM & MAG HYDROXIDE-SIMETH 200-200-20 MG/5ML PO SUSP
15.0000 mL | Freq: Four times a day (QID) | ORAL | Status: DC | PRN
  Administered 2015-12-17: 15 mL via ORAL
  Filled 2015-12-17 (×2): qty 30

## 2015-12-17 NOTE — Telephone Encounter (Signed)
Pt is still admitted, info will print with discharge summary, dpm

## 2015-12-17 NOTE — Telephone Encounter (Signed)
-----   Message from Mena Goes, RN sent at 12/13/2015 10:12 AM EST ----- Regarding: schedule   ----- Message -----    From: Angelia Mould, MD    Sent: 12/13/2015   9:30 AM      To: [facility] Charge Pool Subject: charge                                         PROCEDURE:  1. Ultrasound-guided placement of right IJ tunneled dialysis catheter 2. Left brachiocephalic AV fistula  SURGEON: Judeth Cornfield. Scot Dock, MD, FACS  ASSIST: none  He will need a follow up visit in 6 weeks to check on the maturation of his fistula. He will need a duplex. Thank you. CD

## 2015-12-17 NOTE — Progress Notes (Signed)
[facility] ROUNDING NOTE   Subjective:   Interval History:  Seen in the HD unit Having some nausea, and yesterday had some lower abd discomfort - a little better today Seen by Dr. Alinda Money for his R renal subcapsular hematoma   Objective:  Vital signs in last 24 hours:  Temp:  [97.8 F (36.6 C)-98.6 F (37 C)] 97.8 F (36.6 C) (03/07 0700) Pulse Rate:  [62-85] 83 (03/07 0705) Resp:  [13-18] 13 (03/07 0700) BP: (120-143)/(71-78) 128/74 mmHg (03/07 0705) SpO2:  [94 %-99 %] 99 % (03/07 0700) Weight:  [108.8 kg (239 lb 13.8 oz)] 108.8 kg (239 lb 13.8 oz) (03/07 0700)  Weight change: 1.3 kg (2 lb 13.9 oz) Filed Weights   12/14/15 0938 12/15/15 2030 12/17/15 0700  Weight: 108 kg (238 lb 1.6 oz) 107.5 kg (236 lb 15.9 oz) 108.8 kg (239 lb 13.8 oz)    Intake/Output: I/O last 3 completed shifts: In: 720 [P.O.:720] Out: 300 [Urine:300]   Intake/Output this shift:    Physical examination BP 128/74 mmHg  Pulse 83  Temp(Src) 97.8 F (36.6 C) (Oral)  Resp 13  Ht 5\' 11"  (1.803 m)  Wt 108.8 kg (239 lb 13.8 oz)  BMI 33.47 kg/m2  SpO2 99% Seen in HD - awake, alert, pleasant Lungs clear ABD- BS present soft non-distended EXT- trace edema of LE's which are "chunky" - but not a lot of edema TDC R chest in use for HD L AVF + bruit  Basic Metabolic Panel:  Recent Labs Lab 12/11/15 0825 12/12/15 0754 12/13/15 0500 12/14/15 0735 12/16/15 1700  NA 131* 129* 127* 124* 132*  K 3.7 4.2 4.2 3.9 4.5  CL 94* 91* 88* 86* 95*  CO2 23 25 24 23 24   GLUCOSE 130* 153* 139* 249* 152*  BUN 103* 103* 105* 117* 79*  CREATININE 7.94* 7.76* 7.74* 7.63* 6.45*  CALCIUM 8.5* 8.7* 8.7* 9.2 9.1  MG  --  2.3  --   --   --   PHOS 5.9* 5.9* 5.9* 6.4*  --       Recent Labs Lab 12/11/15 0825 12/12/15 0754 12/13/15 0500 12/14/15 0735 12/16/15 1700  AST  --   --   --   --  24  ALT  --   --   --   --  15*  ALKPHOS  --   --   --   --  50  BILITOT  --   --   --   --  0.6   PROT  --   --   --   --  5.9*  ALBUMIN 2.7* 2.8* 2.8* 2.8* 3.0*    CBC:  Recent Labs Lab 12/11/15 0825  12/12/15 0754 12/13/15 0500 12/14/15 0735 12/16/15 0354 12/17/15 0548  WBC 7.1  < > 7.6 7.4 6.7 8.2 8.2  NEUTROABS 4.7  --  4.9  --   --  6.5  --   HGB 7.5*  < > 7.7* 7.7* 7.7* 7.8* 8.2*  HCT 23.1*  < > 23.3* 22.9* 22.4* 23.3* 24.5*  MCV 83.4  < > 83.8 83.6 83.6 85.3 86.6  PLT 254  < > 259 283 291 304 295  < > = values in this interval not displayed.   CBG:  Recent Labs Lab 12/15/15 2029 12/16/15 0741 12/16/15 1151 12/16/15 1637 12/16/15 2015  GLUCAP 176* 132* 113* 135* 145*     Medications:   . sodium chloride 0  (12/08/15 1802)   . amLODipine  10  mg Oral Daily  . atorvastatin  40 mg Oral q1800  . bisacodyl  10 mg Oral Daily  . calcitRIOL  0.5 mcg Oral Daily  . carvedilol  6.25 mg Oral BID WC  . darbepoetin (ARANESP) injection - DIALYSIS  60 mcg Intravenous Q Tue-HD  . ferric gluconate (FERRLECIT/NULECIT) IV  125 mg Intravenous QODAY  . insulin aspart  0-9 Units Subcutaneous TID WC  . polyethylene glycol  17 g Oral Daily  . potassium chloride  20 mEq Oral BID  . senna-docusate  1 tablet Oral BID  . sevelamer carbonate  1,600 mg Oral TID WC  . sodium chloride flush  3 mL Intravenous Q12H   sodium chloride, sodium chloride, acetaminophen **OR** acetaminophen, albuterol, alteplase, alum & mag hydroxide-simeth, bisacodyl, cyclobenzaprine, famotidine, heparin, HYDROmorphone (DILAUDID) injection, lidocaine (PF), lidocaine-prilocaine, ondansetron **OR** ondansetron (ZOFRAN) IV, oxyCODONE, pentafluoroprop-tetrafluoroeth  Background: 70 yo presented  w/flank pain, spontaneous right renal subcapsular hematoma. Anticoagulation (prior DVT) reversed. 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  Volume overloaded and uremic - HD initiated 12/14/15.   Assessment/ Plan:  1. New end stage Renal disease. New L BC AVF and R  IJ TDC done 3/3. CLIP process started. Had 1st HD 3/4, HD#2 now. CLIP process started - waiting for an outpt assignment. No heparin with HD for several weeks anyway. (unclear why he bled in the first place as INR was subtherapeutic) 2. Pulm edema / LE edema - continue to remove volume with HD - EDW not yet established. 108.8 pre HD today.  3. R flank pain/ subcapsular hematoma - reversed anticoagulation. Seen by Dr. Alinda Money who recommends repeat imaging in the next 3 months by Dr. Risa Grill to further evaluate his complex renal cystic lesions, preferably with contrast. 4. HTN on norvasc/ coreg, bp's good 5. Anemia of CKD (+ ABLA from bleed) - On Aranesp. TSat 8% 2/25. Fe load ongoing. 6. Hx bladder cancer 7. Renal cysts, some complex lesions - see above 8. Systolic CHF - no cath for now per cardiology; reassess  9. DVT - anticoagulation reversed. Dr. Alinda Money makes good point about his chronic anticoagulation.  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 No PTH data (ordered/pending). Sevelamer for elev phos  Jamal Maes, MD [facility] [phone] Pager 12/17/2015, 7:25 AM

## 2015-12-17 NOTE — Procedures (Signed)
I have personally attended this patient's dialysis session.   HD#2 TDC UF goal 2-3 liters No heparin (recent renal bleed)  Jamal Maes, MD [facility] [phone] Pager 12/17/2015, 7:35 AM

## 2015-12-17 NOTE — Progress Notes (Signed)
[facility] Progress Note  Patient: Albert Stanley [id number]   PCP: [name], MD DOB: [birth year]   DOA: 12/03/2015   DOS: 12/17/2015   Date of Service: the patient was seen and examined on 12/17/2015  Subjective: Patient has been having complaints of nausea and vomiting so far 3 episodes. No abdominal pain. No diarrhea. Nutrition: Tolerating oral diet Activity: Walking in the room Last BM: 12/16/2015  Assessment and Plan: 1. Renal hematoma, right The patient presents with complaints of right flank pain. Workup in the ER showed that he had developed right-sided hematoma. Urology was consulted and recommended to hold anticoagulation and continue to monitor the patient clinically. No acute surgical intervention required. H&H remained stable. We'll continue to monitor clinically.  Urology recommended outpatient follow-up with Dr. Risa Grill for further evaluation. Discussed with hematology who recommended to let primary care physician decide regarding his anticoagulation. Pain control as needed.  2. 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 Intractable nausea and vomiting Baseline creatinine 4, presentation 5.4 with volume overload and evidence of uremia. Patient has been on IV Lasix as well as Zaroxolyn. So far -11 L in the hospital. His BUN is consistently climbing and serum creatinine remaining stable. With this patient was discussed with vascular surgery for vein mapping and AV fistula. Nephrology continues to follow up.  patient has been started on hemodialysis and tolerating it well His nausea and vomiting is likely secondary to uremia. Lipase lactic acid and x-ray abdomen negative  3. History of DVT with chronic anticoagulation. Presented with renal hematoma on the right side. H&H remains stable. INR has been reversed. 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 Given this factor patient is currently remaining  off anticoagulation. Patient will need outpatient follow-up with PCP regarding resumption of anticoagulation but until then he needs to remain off of anticoagulation.  4. Acute systolic CHF. Prior echocardiogram showed ejection fraction of 55-60%. This admission echo program were showing 35-40%. Regional wall motion abnormalities were also seen. Cardiology was consulted and the patient was considered a poor candidate for cardiac intervention due to his renal failure. Recommendation is to stabilize his renal function and follow-up with cardiology for cardiac workup as an outpatient after patient is on hemodialysis. He was on IV Lasix as well as Zaroxolyn. Started on Coreg, continue amlodipine.  5. Normocytic anemia. H&H remaining stable. IV iron with hemodialysis Transfuse when necessary for hemoglobin less than 7  6. Type 2 diabetes mellitus. On Januvia at home. Currently here on sliding scale insulin. Sugars are better controlled. HbA1c on 11/2015 5.8  DVT Prophylaxis: mechanical compression device. Nutrition: Renal diet Advance goals of care discussion: Full code  Brief Summary of Hospitalization:  HPI: As per the H and P dictated on admission, "Albert Stanley is a 70 y.o. male with a past medical history of chronic kidney disease unknown stage, history of bladder cancer, history of diabetes on oral agents, history of DVT on anticoagulation, who presented with complaint of pain in the right flank since yesterday evening at 6 PM. The pain was 10 out of 10 in intensity. It was sudden onset. No radiation of the pain. Was associated with vomiting. No precipitating aggravating or relieving factors. Denies any fever or chills. After getting pain medications, pain is better and down to 6 out of 10 in intensity. Denies any lightheadedness. No previous episodes of same. Denies any blood in the urine. No pain with urination.  In  the emergency department he was found to have a right renal  subcapsular hematoma with suspicion for bleeding into a renal cyst. He will be hospitalized for further management." Procedures: Echocardiogram Consultants: Nephrology, vascular surgery, cardiology Antibiotics: Anti-infectives    Start     Dose/Rate Route Frequency Ordered Stop   12/13/15 0800  ceFAZolin (ANCEF) IVPB 2 g/50 mL premix     2 g 100 mL/hr over 30 Minutes Intravenous  Once 12/13/15 0747 12/13/15 0747   12/13/15 0715  vancomycin (VANCOCIN) powder 1,000 mg     1,000 mg Other To Surgery 12/12/15 1040 12/14/15 0715      Family Communication: no family was present at bedside, at the time of interview.   Disposition:  Barriers to safe discharge: Tolerating hemodialysis.   Intake/Output Summary (Last 24 hours) at 12/17/15 1657 Last data filed at 12/17/15 1201  Gross per 24 hour  Intake    240 ml  Output   3300 ml  Net  -3060 ml   Filed Weights   12/15/15 2030 12/17/15 0700 12/17/15 1000  Weight: 107.5 kg (236 lb 15.9 oz) 108.8 kg (239 lb 13.8 oz) 105.6 kg (232 lb 12.9 oz)    Objective: Physical Exam: Filed Vitals:   12/17/15 0900 12/17/15 0930 12/17/15 1000 12/17/15 1108  BP: 127/66 114/72 139/83 149/75  Pulse: 87 82 87 85  Temp:   97.8 F (36.6 C) 98.9 F (37.2 C)  TempSrc:   Oral Oral  Resp:   17 16  Height:      Weight:   105.6 kg (232 lb 12.9 oz)   SpO2:   99% 98%    General: Appear in no distress, no Rash; Oral Mucosa moist. Drowsy post procedure Cardiovascular: S1 and S2 Present, no Murmur, no JVD Respiratory: Bilateral Air entry present and Clear to Auscultation, no Crackles, no wheezes Abdomen: Bowel Sound present, Soft and no tenderness Extremities: no Pedal edema, no calf tenderness  Data Reviewed: CBC:  Recent Labs Lab 12/11/15 0825  12/12/15 0754 12/13/15 0500 12/14/15 0735 12/16/15 0354 12/17/15 0548  WBC 7.1  < > 7.6 7.4 6.7 8.2 8.2  NEUTROABS 4.7  --  4.9  --   --  6.5  --   HGB 7.5*  < > 7.7* 7.7* 7.7* 7.8* 8.2*  HCT 23.1*  < >  23.3* 22.9* 22.4* 23.3* 24.5*  MCV 83.4  < > 83.8 83.6 83.6 85.3 86.6  PLT 254  < > 259 283 291 304 295  < > = values in this interval not displayed. Basic Metabolic Panel:  Recent Labs Lab 12/11/15 0825 12/12/15 0754 12/13/15 0500 12/14/15 0735 12/16/15 1700 12/17/15 0548  NA 131* 129* 127* 124* 132* 131*  K 3.7 4.2 4.2 3.9 4.5 4.4  CL 94* 91* 88* 86* 95* 96*  CO2 23 25 24 23 24 23   GLUCOSE 130* 153* 139* 249* 152* 175*  BUN 103* 103* 105* 117* 79* 81*  CREATININE 7.94* 7.76* 7.74* 7.63* 6.45* 6.37*  CALCIUM 8.5* 8.7* 8.7* 9.2 9.1 8.8*  MG  --  2.3  --   --   --   --   PHOS 5.9* 5.9* 5.9* 6.4*  --  4.2   Liver Function Tests:  Recent Labs Lab 12/12/15 0754 12/13/15 0500 12/14/15 0735 12/16/15 1700 12/17/15 0548  AST  --   --   --  24  --   ALT  --   --   --  15*  --   ALKPHOS  --   --   --  50  --   BILITOT  --   --   --  0.6  --   PROT  --   --   --  5.9*  --   ALBUMIN 2.8* 2.8* 2.8* 3.0* 2.9*    Recent Labs Lab 12/16/15 1700  LIPASE 45   No results for input(s): AMMONIA in the last 168 hours.  Cardiac Enzymes: No results for input(s): CKTOTAL, CKMB, CKMBINDEX, TROPONINI in the last 168 hours.  BNP (last 3 results)  Recent Labs  02/06/15 2204  BNP 53.4    CBG:  Recent Labs Lab 12/16/15 0741 12/16/15 1151 12/16/15 1637 12/16/15 2015 12/17/15 1106  GLUCAP 132* 113* 135* 145* 121*    Recent Results (from the past 240 hour(s))  Surgical pcr screen     Status: Abnormal   Collection Time: 12/13/15  5:49 AM  Result Value Ref Range Status   MRSA, PCR NEGATIVE NEGATIVE Final   Staphylococcus aureus POSITIVE (A) NEGATIVE Final    Comment:        The Xpert SA Assay (FDA approved for NASAL specimens in patients over 26 years of age), is one component of a comprehensive surveillance program.  Test performance has been validated by [facility] for patients greater than or equal to 24 year old. It is not intended to diagnose infection nor  to guide or monitor treatment.      Studies: No results found.   Scheduled Meds: . amLODipine  10 mg Oral Daily  . atorvastatin  40 mg Oral q1800  . bisacodyl  10 mg Oral Daily  . calcitRIOL  0.5 mcg Oral Daily  . carvedilol  6.25 mg Oral BID WC  . darbepoetin (ARANESP) injection - DIALYSIS  60 mcg Intravenous Q Tue-HD  . ferric gluconate (FERRLECIT/NULECIT) IV  125 mg Intravenous QODAY  . insulin aspart  0-9 Units Subcutaneous TID WC  . polyethylene glycol  17 g Oral Daily  . potassium chloride  20 mEq Oral BID  . senna-docusate  1 tablet Oral BID  . sevelamer carbonate  1,600 mg Oral TID WC  . sodium chloride flush  3 mL Intravenous Q12H   Continuous Infusions: . sodium chloride 0  (12/08/15 1802)   PRN Meds: acetaminophen **OR** acetaminophen, albuterol, alum & mag hydroxide-simeth, bisacodyl, cyclobenzaprine, famotidine, HYDROmorphone (DILAUDID) injection, ondansetron **OR** ondansetron (ZOFRAN) IV, oxyCODONE  Time spent: 30 minutes  Author: Berle Mull, MD [facility] Pager: [phone] 12/17/2015 4:57 PM  If 7PM-7AM, please contact night-coverage at [email or web address], password [facility]

## 2015-12-18 LAB — RENAL FUNCTION PANEL
Albumin: 2.8 g/dL — ABNORMAL LOW (ref 3.5–5.0)
Anion gap: 12 (ref 5–15)
BUN: 47 mg/dL — AB (ref 6–20)
CALCIUM: 8.6 mg/dL — AB (ref 8.9–10.3)
CO2: 25 mmol/L (ref 22–32)
CREATININE: 5.04 mg/dL — AB (ref 0.61–1.24)
Chloride: 95 mmol/L — ABNORMAL LOW (ref 101–111)
GFR calc Af Amer: 12 mL/min — ABNORMAL LOW (ref >60)
GFR calc non Af Amer: 10 mL/min — ABNORMAL LOW (ref >60)
GLUCOSE: 132 mg/dL — AB (ref 65–99)
Phosphorus: 3.3 mg/dL (ref 2.5–4.6)
Potassium: 4.5 mmol/L (ref 3.5–5.1)
SODIUM: 132 mmol/L — AB (ref 135–145)

## 2015-12-18 LAB — CBC
HCT: 24.3 % — ABNORMAL LOW (ref 39.0–52.0)
HEMOGLOBIN: 8.3 g/dL — AB (ref 13.0–17.0)
MCH: 29.6 pg (ref 26.0–34.0)
MCHC: 34.2 g/dL (ref 30.0–36.0)
MCV: 86.8 fL (ref 78.0–100.0)
PLATELETS: 262 10*3/uL (ref 150–400)
RBC: 2.8 MIL/uL — ABNORMAL LOW (ref 4.22–5.81)
RDW: 15 % (ref 11.5–15.5)
WBC: 9.2 10*3/uL (ref 4.0–10.5)

## 2015-12-18 LAB — GLUCOSE, CAPILLARY
GLUCOSE-CAPILLARY: 128 mg/dL — AB (ref 65–99)
GLUCOSE-CAPILLARY: 191 mg/dL — AB (ref 65–99)
Glucose-Capillary: 140 mg/dL — ABNORMAL HIGH (ref 65–99)

## 2015-12-18 LAB — PARATHYROID HORMONE, INTACT (NO CA): PTH: 147 pg/mL — AB (ref 15–65)

## 2015-12-18 MED ORDER — FAMOTIDINE 20 MG PO TABS: 20.0000 mg | ORAL_TABLET | Freq: Two times a day (BID) | ORAL | Status: DC | PRN

## 2015-12-18 MED ORDER — CALCITRIOL 0.5 MCG PO CAPS: 0.5000 ug | ORAL_CAPSULE | ORAL | Status: DC

## 2015-12-18 MED ORDER — CARVEDILOL 6.25 MG PO TABS: 6.2500 mg | ORAL_TABLET | Freq: Two times a day (BID) | ORAL | Status: DC

## 2015-12-18 MED ORDER — GLIPIZIDE 5 MG PO TABS: 5.0000 mg | ORAL_TABLET | Freq: Every day | ORAL | Status: DC

## 2015-12-18 MED ORDER — SEVELAMER CARBONATE 800 MG PO TABS: 1600.0000 mg | ORAL_TABLET | Freq: Three times a day (TID) | ORAL | Status: DC

## 2015-12-18 MED ORDER — OXYCODONE HCL 5 MG PO TABS: 5.0000 mg | ORAL_TABLET | ORAL | Status: DC | PRN

## 2015-12-18 MED ORDER — ATORVASTATIN CALCIUM 40 MG PO TABS: 40.0000 mg | ORAL_TABLET | Freq: Every day | ORAL | Status: DC

## 2015-12-18 NOTE — Progress Notes (Signed)
12/18/2015 10:01 AM Hemodialysis Outpatient Note; this patient has been accepted at the [facility] on a Tuesday, Thursday and Saturday 2nd shift schedule. Tentative start time will be 1PM. Please advise center if patient will be starting tomorrow. Thank you. Gordy Savers

## 2015-12-18 NOTE — Discharge Summary (Signed)
Physician Discharge Summary  Albert Stanley [id number] DOB: [birth year] DOA: 12/03/2015  PCP: [name], MD  Admit date: 12/03/2015 Discharge date: 12/18/2015  Time spent: 35 minutes  Recommendations for Outpatient Follow-up:  Will need titration of his diabetic medications- may need to start Sq insulin- resistant at this time Patient to keep tract of blood sugars Discuss  whether or not need to restart coumadin but with spontaneous hemm with sub therapeutic INR would be careful  Discharge Diagnoses:  Principal Problem:   Renal hematoma, right Active Problems:   CORONARY ATHEROSCLEROSIS NATIVE CORONARY ARTERY   CKD (chronic kidney disease), stage IV ([name])   Normocytic anemia   Anticoagulant long-term use   History of DVT (deep vein thrombosis)   Acute systolic CHF (congestive heart failure) ([name])   Essential hypertension   Type 2 diabetes mellitus ([place])   Discharge Condition: improved  Diet recommendation: renal/diabetic  Filed Weights   12/15/15 2030 12/17/15 0700 12/17/15 1000  Weight: 107.5 kg (236 lb 15.9 oz) 108.8 kg (239 lb 13.8 oz) 105.6 kg (232 lb 12.9 oz)    History of present illness:  Albert Stanley is a 70 y.o. male with a past medical history of chronic kidney disease unknown stage, history of bladder cancer, history of diabetes on oral agents, history of DVT on anticoagulation, who presented with complaint of pain in the right flank since yesterday evening at 6 PM. The pain was 10 out of 10 in intensity. It was sudden onset. No radiation of the pain. Was associated with vomiting. No precipitating aggravating or relieving factors. Denies any fever or chills. After getting pain medications, pain is better and down to 6 out of 10 in intensity. Denies any lightheadedness. No previous episodes of same. Denies any blood in the urine. No pain with urination.  In the emergency department he was found to have a right renal subcapsular hematoma with suspicion for  bleeding into a renal cyst. He will be hospitalized for further management.  Hospital Course:  Renal hematoma, right -presents with complaints of right flank pain -right-sided hematoma. -Urology was consulted and recommended to hold anticoagulation and continue to monitor the patient clinically. No acute surgical intervention required. H&H remained stable. Urology recommended outpatient follow-up with Dr. Risa Grill for further evaluation. Dr. Posey Pronto discussed with hematology who recommended to let primary care physician decide regarding his anticoagulation. Pain control as needed.  B/l Hand numbness- No sign of steal syndrome -given symptoms to look out for by neuro  Acute on chronic kidney disease stage V. Baseline creatinine 4, presentation 5.4 with volume overload and evidence of uremia. vascular surgery for vein mapping and AV fistula. Nephrology continues to follow up.  patient has been started on hemodialysis and tolerating it well- await CLIPPING His nausea and vomiting was likely secondary to uremia. Lipase lactic acid and x-ray abdomen negative  History of DVT with chronic anticoagulation. Presented with renal hematoma on the right side. H&H remains stable. INR has been reversed. 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 Patient will need outpatient follow-up with PCP regarding resumption of anticoagulation but until then he needs to remain off of anticoagulation.  Acute systolic CHF. Prior echocardiogram showed ejection fraction of 55-60%. This admission echo program were showing 35-40%. Regional wall motion abnormalities were also seen. Cardiology was consulted and the patient was considered a poor candidate for cardiac intervention due to his renal failure. Recommendation is to stabilize his renal function  and follow-up with cardiology for cardiac workup as an outpatient after patient is on  hemodialysis.  Normocytic anemia. H&H remaining stable IV iron with hemodialysis Transfuse when necessary for hemoglobin less than 7  Type 2 diabetes mellitus. On Januvia at home. Currently here on sliding scale insulin. Sugars are better controlled. HbA1c on 11/2015 5.8    Procedures:    Consultations:  Renal  urology  Discharge Exam: Filed Vitals:   12/18/15 0639 12/18/15 0837  BP: 139/93 131/57  Pulse: 82 72  Temp: 98 F (36.7 C) 98.7 F (37.1 C)  Resp: 17 18    Discharge Instructions   Discharge Instructions    Discharge instructions    Complete by:  As directed   HD at [facility]-- be there for paperwork tomm at 12:40 PM You will be given calcitriol at dialysis Renal/carb mod diet     Increase activity slowly    Complete by:  As directed           Current Discharge Medication List    START taking these medications   Details  atorvastatin (LIPITOR) 40 MG tablet Take 1 tablet (40 mg total) by mouth daily at 6 PM. Qty: 30 tablet, Refills: 0    carvedilol (COREG) 6.25 MG tablet Take 1 tablet (6.25 mg total) by mouth 2 (two) times daily with a meal. Qty: 60 tablet, Refills: 0    famotidine (PEPCID) 20 MG tablet Take 1 tablet (20 mg total) by mouth 2 (two) times daily as needed for heartburn.    glipiZIDE (GLUCOTROL) 5 MG tablet Take 1 tablet (5 mg total) by mouth daily before breakfast. Qty: 30 tablet, Refills: 0    oxyCODONE (OXY IR/ROXICODONE) 5 MG immediate release tablet Take 1 tablet (5 mg total) by mouth every 4 (four) hours as needed for moderate pain. Qty: 15 tablet, Refills: 0    sevelamer carbonate (RENVELA) 800 MG tablet Take 2 tablets (1,600 mg total) by mouth 3 (three) times daily with meals. Qty: 90 tablet, Refills: 0      CONTINUE these medications which have NOT CHANGED   Details  amLODipine (NORVASC) 10 MG tablet Take 10 mg by mouth daily. Refills: 0    cyclobenzaprine (FLEXERIL) 5 MG tablet Take 5 mg by mouth 3 (three)  times daily as needed for muscle spasms.    fluticasone (FLONASE) 50 MCG/ACT nasal spray Place 1 spray into both nostrils daily as needed for allergies or rhinitis.      STOP taking these medications     allopurinol (ZYLOPRIM) 300 MG tablet      calcitRIOL (ROCALTROL) 0.5 MCG capsule      doxazosin (CARDURA) 4 MG tablet      ferrous sulfate 325 (65 FE) MG tablet      furosemide (LASIX) 80 MG tablet      glipiZIDE (GLUCOTROL XL) 5 MG 24 hr tablet      indapamide (LOZOL) 1.25 MG tablet      JANUVIA 50 MG tablet      ondansetron (ZOFRAN ODT) 8 MG disintegrating tablet      oxyCODONE-acetaminophen (PERCOCET) 7.5-325 MG tablet      oxyCODONE-acetaminophen (PERCOCET/ROXICET) 5-325 MG per tablet      oxymetazoline (AFRIN) 0.05 % nasal spray      rosuvastatin (CRESTOR) 5 MG tablet      warfarin (COUMADIN) 5 MG tablet      cloNIDine (CATAPRES) 0.1 MG tablet      zolpidem (AMBIEN) 5 MG tablet  Allergies  Allergen Reactions  . Meperidine Hcl     Unknown  . Penicillins     "Knocks me out."   Has patient had a PCN reaction causing immediate rash, facial/tongue/throat swelling, SOB or lightheadedness with hypotension: yes- blacked out Has patient had a PCN reaction causing severe rash involving mucus membranes or skin necrosis: unknown Has patient had a PCN reaction that required hospitalization: at MD office Has patient had a PCN reaction occurring within the last 10 years: no If all of the above answers are "NO", then may proceed with Cephalosporin use.   . Sulfonamide Derivatives     Unknown.   . Povidone-Iodine Rash   Follow-up Information    Follow up with [name], MD In 6 weeks.   Specialties:  Vascular Surgery, Cardiology   Why:  Office will call you to arrange your appt (sent)   Contact information:   [place] [address] [phone]       Follow up with [name], MD In 1 week.   Specialty:  Internal Medicine    Contact information:   [address] [place] [address] [phone]       Please follow up.   Why:  HD at [facility] dialysis - be there at 12:40 PM tomm: [facility]        The results of significant diagnostics from this hospitalization (including imaging, microbiology, ancillary and laboratory) are listed below for reference.    Significant Diagnostic Studies: Dg Chest 2 View  12/07/2015  CLINICAL DATA:  70 year old male with a history of shortness of breath EXAM: CHEST - 2 VIEW COMPARISON:  12/04/2015, 02/25/2015 FINDINGS: Cardiomediastinal silhouette unchanged, with cardiomegaly. Central vascular congestion. No pneumothorax.  No pleural effusion. Low lung volumes with interlobular septal thickening and interstitial opacities. Aeration similar to the comparison. IMPRESSION: Similar appearance of the chest x-ray with evidence of CHF. Signed, Dulcy Fanny. Earleen Newport, DO [facility] Electronically Signed   By: Corrie Mckusick D.O.   On: 12/07/2015 09:35   Dg Chest Port 1 View  12/13/2015  CLINICAL DATA:  Status post right tunneled dialysis catheter placement. EXAM: PORTABLE CHEST 1 VIEW COMPARISON:  12/07/2015 FINDINGS: Right jugular tunnel dialysis catheter present with the distal lumen tip just below the SVC/RA junction. Stable cardiac enlargement. No pneumothorax or edema. No pleural fluid identified. IMPRESSION: Status post tunneled dialysis catheter placement. No evidence of pneumothorax or other acute finding. The distal lumen tip lies just below the SVC/RA junction. Electronically Signed   By: Aletta Edouard M.D.   On: 12/13/2015 09:42   Dg Chest Port 1 View  12/04/2015  CLINICAL DATA:  Volume overload EXAM: PORTABLE CHEST 1 VIEW COMPARISON:  02/25/2015 FINDINGS: Cardiac shadow is enlarged. Pulmonary vascular congestion with mild pulmonary edema is noted. No sizable effusion is seen. No bony abnormality  is noted. IMPRESSION: Changes consistent with CHF consistent with the given clinical history. Electronically Signed   By: Inez Catalina M.D.   On: 12/04/2015 16:37   Dg Abd Portable 1v  12/16/2015  CLINICAL DATA:  Generalized abdominal pain, nausea, vomiting. EXAM: PORTABLE ABDOMEN - 1 VIEW COMPARISON:  None. FINDINGS: The bowel gas pattern is normal. No radio-opaque calculi or other significant radiographic abnormality are seen. Status post left hip arthroplasty. IMPRESSION: No evidence of bowel obstruction or ileus. Electronically Signed   By: Marijo Conception, M.D.   On: 12/16/2015 15:46   Dg Fluoro Guide  Cv Line-no Report  12/13/2015  CLINICAL DATA:  FLOURO GUIDE CV LINE Fluoroscopy was utilized by the requesting physician.  No radiographic interpretation.   Ct Renal Stone Study  12/04/2015  CLINICAL DATA:  Right lower quadrant and right flank pain with constipation and hematuria since last night. Anemia with bilateral leg swelling for a while. EXAM: CT ABDOMEN AND PELVIS WITHOUT CONTRAST TECHNIQUE: Multidetector CT imaging of the abdomen and pelvis was performed following the standard protocol without IV contrast. COMPARISON:  Lumbar MRI 11/22/2014.  Abdominal pelvic CT 12/18/2010. FINDINGS: Lower chest: Small right-greater-than-left pleural effusions have enlarged. There is mild atelectasis at both lung bases. Cardiomegaly and a small pericardial effusion are unchanged. Hepatobiliary: As evaluated in the noncontrast state, the liver appears unremarkable per No evidence of gallstones, gallbladder wall thickening or biliary dilatation. Pancreas: Unremarkable. No pancreatic ductal dilatation or surrounding inflammatory changes. Spleen: Normal in size without focal abnormality. Adrenals/Urinary Tract: The adrenal glands appear stable without suspicious findings. There has been interval development of a large subcapsular hematoma involving the right kidney. This measures up to 2.9 cm in thickness and  demonstrates high density. There is resulting compression of the right renal parenchyma. There is a complex lesion in the upper pole of the right kidney measuring up to 5.2 cm in diameter on image 29, enlarged from the prior study. This is complex with areas of increased density. There are other smaller hyperdense lesions in the right kidney, including a 2.1 cm lesion in the upper pole on image 23 and a 2.0 cm lesion anteriorly in the mid right kidney on image 30. There is also a simple cyst in the upper pole the right kidney. There are enlarging low-density left renal lesions, measuring up to 6.5 cm in the upper pole and 7.5 cm in the lower pole. In addition, there are several small hyperdense left renal lesions as well. There is no evidence of urinary tract calculus or hydronephrosis. There is asymmetric perinephric soft tissue stranding on the right. No significant bladder abnormalities are seen; bladder assessment is limited by artifact from the patient's left hip arthroplasty. Stomach/Bowel: No evidence of bowel wall thickening, distention or surrounding inflammatory change. The are diverticular changes of the sigmoid colon. Vascular/Lymphatic: There are no enlarged abdominal or pelvic lymph nodes. Aortoiliac atherosclerosis noted. Reproductive: Mild enlargement of the prostate gland, grossly stable. Other: No ascites or peritoneal nodularity. There is a small umbilical hernia containing only fat. Musculoskeletal: No acute or significant osseous findings. There are extensive degenerative changes throughout the spine. Patient is status post left total hip arthroplasty. Chronic fragmentation of the left greater trochanter is unchanged. IMPRESSION: 1. Interval development of large right renal subcapsular hematoma with compression of the renal parenchyma. This hematoma is probably related to hemorrhage into one of several hyperdense renal cysts which have enlarged from the prior study. 2. Enlarging simple and  complex renal lesions bilaterally, likely cysts based on the previous study. There is 1 in the upper pole the right kidney which is complex with heterogeneous density, measuring 5.2 cm in diameter. After the patient's subcapsular hematoma has resolved, follow up to include pre and post-contrast imaging with CT or MRI recommended to exclude solid lesion. 3. No evidence of urinary tract calculus or hydronephrosis. 4. Additional incidental findings as above, including cardiomegaly, pleural and pericardial effusions, atherosclerosis and prostatomegaly. 5. These results were called by telephone at the time of interpretation on 12/04/2015 at 8:48 am to Dr. Leo Grosser , who verbally acknowledged these results. Electronically  Signed   By: Richardean Sale M.D.   On: 12/04/2015 08:48    Microbiology: Recent Results (from the past 240 hour(s))  Surgical pcr screen     Status: Abnormal   Collection Time: 12/13/15  5:49 AM  Result Value Ref Range Status   MRSA, PCR NEGATIVE NEGATIVE Final   Staphylococcus aureus POSITIVE (A) NEGATIVE Final    Comment:        The Xpert SA Assay (FDA approved for NASAL specimens in patients over 68 years of age), is one component of a comprehensive surveillance program.  Test performance has been validated by [facility] for patients greater than or equal to 48 year old. It is not intended to diagnose infection nor to guide or monitor treatment.      Labs: Basic Metabolic Panel:  Recent Labs Lab 12/12/15 0754 12/13/15 0500 12/14/15 0735 12/16/15 1700 12/17/15 0548 12/18/15 0621  NA 129* 127* 124* 132* 131* 132*  K 4.2 4.2 3.9 4.5 4.4 4.5  CL 91* 88* 86* 95* 96* 95*  CO2 25 24 23 24 23 25   GLUCOSE 153* 139* 249* 152* 175* 132*  BUN 103* 105* 117* 79* 81* 47*  CREATININE 7.76* 7.74* 7.63* 6.45* 6.37* 5.04*  CALCIUM 8.7* 8.7* 9.2 9.1 8.8* 8.6*  MG 2.3  --   --   --   --   --   PHOS 5.9* 5.9* 6.4*  --  4.2 3.3   Liver Function Tests:  Recent Labs Lab  12/13/15 0500 12/14/15 0735 12/16/15 1700 12/17/15 0548 12/18/15 0621  AST  --   --  24  --   --   ALT  --   --  15*  --   --   ALKPHOS  --   --  50  --   --   BILITOT  --   --  0.6  --   --   PROT  --   --  5.9*  --   --   ALBUMIN 2.8* 2.8* 3.0* 2.9* 2.8*    Recent Labs Lab 12/16/15 1700  LIPASE 45   No results for input(s): AMMONIA in the last 168 hours. CBC:  Recent Labs Lab 12/12/15 0754 12/13/15 0500 12/14/15 0735 12/16/15 0354 12/17/15 0548 12/18/15 0621  WBC 7.6 7.4 6.7 8.2 8.2 9.2  NEUTROABS 4.9  --   --  6.5  --   --   HGB 7.7* 7.7* 7.7* 7.8* 8.2* 8.3*  HCT 23.3* 22.9* 22.4* 23.3* 24.5* 24.3*  MCV 83.8 83.6 83.6 85.3 86.6 86.8  PLT 259 283 291 304 295 262   Cardiac Enzymes: No results for input(s): CKTOTAL, CKMB, CKMBINDEX, TROPONINI in the last 168 hours. BNP: BNP (last 3 results)  Recent Labs  02/06/15 2204  BNP 53.4    ProBNP (last 3 results) No results for input(s): PROBNP in the last 8760 hours.  CBG:  Recent Labs Lab 12/17/15 1106 12/17/15 1656 12/17/15 2119 12/18/15 0733 12/18/15 1211  GLUCAP 121* 177* 137* 128* 191*       Signed:  [facility] 12/18/2015, 3:08 PM

## 2015-12-18 NOTE — Progress Notes (Signed)
PROGRESS NOTE  Albert PROPSON [id number] DOB: 1945/12/24 DOA: 12/03/2015 PCP: [name], MD  Assessment/Plan: Renal hematoma, right -presents with complaints of right flank pain -right-sided hematoma. -Urology was consulted and recommended to hold anticoagulation and continue to monitor the patient clinically. No acute surgical intervention required. H&H remained stable. Urology recommended outpatient follow-up with Dr. Risa Grill for further evaluation. Dr. Posey Pronto discussed with hematology who recommended to let primary care physician decide regarding his anticoagulation. Pain control as needed.  Hand numbness- right -MRI  Acute on chronic kidney disease stage V. Baseline creatinine 4, presentation 5.4 with volume overload and evidence of uremia. Patient has been on IV Lasix as well as Zaroxolyn. So far -11 L in the hospital. His BUN is consistently climbing and serum creatinine remaining stable. With this patient was discussed with vascular surgery for vein mapping and AV fistula. Nephrology continues to follow up.  patient has been started on hemodialysis and tolerating it well- await CLIPPING His nausea and vomiting is likely secondary to uremia. Lipase lactic acid and x-ray abdomen negative  History of DVT with chronic anticoagulation. Presented with renal hematoma on the right side. H&H remains stable. INR has been reversed. 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 Patient will need outpatient follow-up with PCP regarding resumption of anticoagulation but until then he needs to remain off of anticoagulation.  Acute systolic CHF. Prior echocardiogram showed ejection fraction of 55-60%. This admission echo program were showing 35-40%. Regional wall motion abnormalities were also seen. Cardiology was consulted and the patient was considered a poor candidate for cardiac intervention due to his renal  failure. Recommendation is to stabilize his renal function and follow-up with cardiology for cardiac workup as an outpatient after patient is on hemodialysis. He was on IV Lasix as well as Zaroxolyn. Started on Coreg, continue amlodipine.   Normocytic anemia. H&H remaining stable IV iron with hemodialysis Transfuse when necessary for hemoglobin less than 7  Type 2 diabetes mellitus. On Januvia at home. Currently here on sliding scale insulin. Sugars are better controlled. HbA1c on 11/2015 5.8   Code Status: full Family Communication: patient Disposition Plan:    Consultants:  nephrology  Procedures:     HPI/Subjective: C/o left hand feeling "funny" Not numbness, no weakness   Objective: Filed Vitals:   12/18/15 0639 12/18/15 0837  BP: 139/93 131/57  Pulse: 82 72  Temp: 98 F (36.7 C) 98.7 F (37.1 C)  Resp: 17 18    Intake/Output Summary (Last 24 hours) at 12/18/15 1303 Last data filed at 12/18/15 1152  Gross per 24 hour  Intake    360 ml  Output    300 ml  Net     60 ml   Filed Weights   12/15/15 2030 12/17/15 0700 12/17/15 1000  Weight: 107.5 kg (236 lb 15.9 oz) 108.8 kg (239 lb 13.8 oz) 105.6 kg (232 lb 12.9 oz)    Exam:   General:  Awake, NAD  Cardiovascular: rrr  Respiratory: clear  Abdomen: +BS, soft  Musculoskeletal: min edema   Data Reviewed: Basic Metabolic Panel:  Recent Labs Lab 12/12/15 0754 12/13/15 0500 12/14/15 0735 12/16/15 1700 12/17/15 0548 12/18/15 0621  NA 129* 127* 124* 132* 131* 132*  K 4.2 4.2 3.9 4.5 4.4 4.5  CL 91* 88* 86* 95* 96* 95*  CO2 25 24 23 24 23 25   GLUCOSE 153* 139* 249* 152* 175* 132*  BUN 103* 105* 117* 79* 81*  47*  CREATININE 7.76* 7.74* 7.63* 6.45* 6.37* 5.04*  CALCIUM 8.7* 8.7* 9.2 9.1 8.8* 8.6*  MG 2.3  --   --   --   --   --   PHOS 5.9* 5.9* 6.4*  --  4.2 3.3   Liver Function Tests:  Recent Labs Lab 12/13/15 0500 12/14/15 0735 12/16/15 1700 12/17/15 0548 12/18/15 0621  AST   --   --  24  --   --   ALT  --   --  15*  --   --   ALKPHOS  --   --  50  --   --   BILITOT  --   --  0.6  --   --   PROT  --   --  5.9*  --   --   ALBUMIN 2.8* 2.8* 3.0* 2.9* 2.8*    Recent Labs Lab 12/16/15 1700  LIPASE 45   No results for input(s): AMMONIA in the last 168 hours. CBC:  Recent Labs Lab 12/12/15 0754 12/13/15 0500 12/14/15 0735 12/16/15 0354 12/17/15 0548 12/18/15 0621  WBC 7.6 7.4 6.7 8.2 8.2 9.2  NEUTROABS 4.9  --   --  6.5  --   --   HGB 7.7* 7.7* 7.7* 7.8* 8.2* 8.3*  HCT 23.3* 22.9* 22.4* 23.3* 24.5* 24.3*  MCV 83.8 83.6 83.6 85.3 86.6 86.8  PLT 259 283 291 304 295 262   Cardiac Enzymes: No results for input(s): CKTOTAL, CKMB, CKMBINDEX, TROPONINI in the last 168 hours. BNP (last 3 results)  Recent Labs  02/06/15 2204  BNP 53.4    ProBNP (last 3 results) No results for input(s): PROBNP in the last 8760 hours.  CBG:  Recent Labs Lab 12/17/15 1106 12/17/15 1656 12/17/15 2119 12/18/15 0733 12/18/15 1211  GLUCAP 121* 177* 137* 128* 191*    Recent Results (from the past 240 hour(s))  Surgical pcr screen     Status: Abnormal   Collection Time: 12/13/15  5:49 AM  Result Value Ref Range Status   MRSA, PCR NEGATIVE NEGATIVE Final   Staphylococcus aureus POSITIVE (A) NEGATIVE Final    Comment:        The Xpert SA Assay (FDA approved for NASAL specimens in patients over 48 years of age), is one component of a comprehensive surveillance program.  Test performance has been validated by [facility] for patients greater than or equal to 30 year old. It is not intended to diagnose infection nor to guide or monitor treatment.      Studies: Dg Abd Portable 1v  12/16/2015  CLINICAL DATA:  Generalized abdominal pain, nausea, vomiting. EXAM: PORTABLE ABDOMEN - 1 VIEW COMPARISON:  None. FINDINGS: The bowel gas pattern is normal. No radio-opaque calculi or other significant radiographic abnormality are seen. Status post left hip  arthroplasty. IMPRESSION: No evidence of bowel obstruction or ileus. Electronically Signed   By: Marijo Conception, M.D.   On: 12/16/2015 15:46    Scheduled Meds: . amLODipine  10 mg Oral Daily  . atorvastatin  40 mg Oral q1800  . bisacodyl  10 mg Oral Daily  . calcitRIOL  0.5 mcg Oral Daily  . carvedilol  6.25 mg Oral BID WC  . darbepoetin (ARANESP) injection - DIALYSIS  60 mcg Intravenous Q Tue-HD  . ferric gluconate (FERRLECIT/NULECIT) IV  125 mg Intravenous QODAY  . insulin aspart  0-9 Units Subcutaneous TID WC  . polyethylene glycol  17 g Oral Daily  . potassium chloride  20 mEq  Oral BID  . senna-docusate  1 tablet Oral BID  . sevelamer carbonate  1,600 mg Oral TID WC  . sodium chloride flush  3 mL Intravenous Q12H   Continuous Infusions: . sodium chloride 0  (12/08/15 1802)   Antibiotics Given (last 72 hours)    None      Principal Problem:   Renal hematoma, right Active Problems:   CORONARY ATHEROSCLEROSIS NATIVE CORONARY ARTERY   CKD (chronic kidney disease), stage IV ([name])   Normocytic anemia   Anticoagulant long-term use   History of DVT (deep vein thrombosis)   Acute systolic CHF (congestive heart failure) ([place])   Essential hypertension   Type 2 diabetes mellitus ([place])    Time spent: 25 min    [facility] Pager [phone]. If 7PM-7AM, please contact night-coverage at [email or web address], password [email or web address] 12/18/2015, 1:03 PM  LOS: 14 days

## 2015-12-18 NOTE — Progress Notes (Signed)
12/18/2015 2:51 PM Hemodialysis Update; please have Albert Stanley arrive for his appointment tomorrow at the [facility] for 12:40PM. Thank you. [facility] [employer]

## 2015-12-18 NOTE — Progress Notes (Signed)
[facility] ROUNDING NOTE   Subjective:   Interval History:   Feels pretty good today Wants to go home Says he "hates dialysis in the bed - can he please HD in a chair" Eating lunch Lots of questions about HD Has some numbness in tingling in both hands (not just confined to hs AVF hand)   Objective:  Vital signs in last 24 hours:  Temp:  [98 F (36.7 C)-99.1 F (37.3 C)] 98.7 F (37.1 C) (03/08 0837) Pulse Rate:  [72-82] 72 (03/08 0837) Resp:  [17-18] 18 (03/08 0837) BP: (131-139)/(57-93) 131/57 mmHg (03/08 0837) SpO2:  [95 %-97 %] 95 % (03/08 0837)  Weight change: -3.2 kg (-7 lb 0.9 oz) Filed Weights   12/15/15 2030 12/17/15 0700 12/17/15 1000  Weight: 107.5 kg (236 lb 15.9 oz) 108.8 kg (239 lb 13.8 oz) 105.6 kg (232 lb 12.9 oz)    Intake/Output: I/O last 3 completed shifts: In: 360 [P.O.:360] Out: 3600 [Urine:600; Other:3000]   Intake/Output this shift:  Total I/O In: 240 [P.O.:240] Out: 0  Physical examination BP 131/57 mmHg  Pulse 72  Temp(Src) 98.7 F (37.1 C) (Oral)  Resp 18  Ht 5\' 11"  (1.803 m)  Wt 105.6 kg (232 lb 12.9 oz)  BMI 32.48 kg/m2  SpO2 95% Awake, alert, pleasant No JVD Lungs clear posteriorly ABD- Protuberant abdomen. + BS. No focal tenderness EXT- Pedal edema but minimal pretibial edema TDC R chest IJ position. Non-tender. Dressing in place. (12/13/15)  Left upper arm AVF + bruit. (12/13/15) Some redness. Hand warm. Good radial pulse.   Basic Metabolic Panel:  Recent Labs Lab 12/12/15 0754 12/13/15 0500 12/14/15 0735 12/16/15 1700 12/17/15 0548 12/18/15 0621  NA 129* 127* 124* 132* 131* 132*  K 4.2 4.2 3.9 4.5 4.4 4.5  CL 91* 88* 86* 95* 96* 95*  CO2 25 24 23 24 23 25   GLUCOSE 153* 139* 249* 152* 175* 132*  BUN 103* 105* 117* 79* 81* 47*  CREATININE 7.76* 7.74* 7.63* 6.45* 6.37* 5.04*  CALCIUM 8.7* 8.7* 9.2 9.1 8.8* 8.6*  MG 2.3  --   --   --   --   --   PHOS 5.9* 5.9* 6.4*  --  4.2 3.3      Recent  Labs Lab 12/13/15 0500 12/14/15 0735 12/16/15 1700 12/17/15 0548 12/18/15 0621  AST  --   --  24  --   --   ALT  --   --  15*  --   --   ALKPHOS  --   --  50  --   --   BILITOT  --   --  0.6  --   --   PROT  --   --  5.9*  --   --   ALBUMIN 2.8* 2.8* 3.0* 2.9* 2.8*      Recent Labs Lab 12/12/15 0754 12/13/15 0500 12/14/15 0735 12/16/15 0354 12/17/15 0548 12/18/15 0621  WBC 7.6 7.4 6.7 8.2 8.2 9.2  NEUTROABS 4.9  --   --  6.5  --   --   HGB 7.7* 7.7* 7.7* 7.8* 8.2* 8.3*  HCT 23.3* 22.9* 22.4* 23.3* 24.5* 24.3*  MCV 83.8 83.6 83.6 85.3 86.6 86.8  PLT 259 283 291 304 295 262   Results for DYLAND, CARCANO (MRN [id number]) as of 12/18/2015 14:35  Ref. Range 12/17/2015 05:48  PTH Latest Ref Range: 15-65 pg/mL 147 (H)   Results for MINNIE, CRISANTO (MRN [id number]) as of 12/18/2015 14:35  Ref. Range 12/07/2015 21:22  Iron Latest Ref Range: 45-182 ug/dL 17 (L)  UIBC Latest Units: ug/dL 196  TIBC Latest Ref Range: 250-450 ug/dL 213 (L)  Saturation Ratios Latest Ref Range: 17.9-39.5 % 8 (L)  Ferritin Latest Ref Range: 24-336 ng/mL 544 (H)     Recent Labs Lab 12/17/15 1106 12/17/15 1656 12/17/15 2119 12/18/15 0733 12/18/15 1211  GLUCAP 121* 177* 137* 128* 191*     Medications:   . sodium chloride 0  (12/08/15 1802)   . amLODipine  10 mg Oral Daily  . atorvastatin  40 mg Oral q1800  . bisacodyl  10 mg Oral Daily  . calcitRIOL  0.5 mcg Oral Daily  . carvedilol  6.25 mg Oral BID WC  . darbepoetin (ARANESP) injection - DIALYSIS  60 mcg Intravenous Q Tue-HD  . ferric gluconate (FERRLECIT/NULECIT) IV  125 mg Intravenous QODAY  . insulin aspart  0-9 Units Subcutaneous TID WC  . polyethylene glycol  17 g Oral Daily  . potassium chloride  20 mEq Oral BID  . senna-docusate  1 tablet Oral BID  . sevelamer carbonate  1,600 mg Oral TID WC  . sodium chloride flush  3 mL Intravenous Q12H   acetaminophen **OR** acetaminophen, albuterol, alum & mag hydroxide-simeth,  bisacodyl, cyclobenzaprine, famotidine, HYDROmorphone (DILAUDID) injection, ondansetron **OR** ondansetron (ZOFRAN) IV, oxyCODONE  Background: 70 yo presented  w/flank pain, spontaneous right renal subcapsular bleed/hematoma. Anticoagulation (prior DVT) reversed. 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  (Volume overloaded and uremic - HD initiated 12/14/15).   Assessment/ Plan:  1. New end stage Renal disease. New L BC AVF and R IJ TDC done 3/3. CLIP process started - anticipate [facility] TTS 2nd shift 1PM - getting clarification.  Had 1st HD 3/4, HD#2 3/7, 3rd HD will be 3/8.  No heparin with HD indefinitely.  (Unclear why he bled in the first place as INR was subtherapeutic). Stop daily K supplement.  2. Pulm edema / LE edema - continue to remove volume with HD - EDW not yet established. 110 kg at start of HD. 108.8->105.6 with last HD. Will try for 104 kg next time and continue to lower.   3. R flank pain/ subcapsular hematoma - reversed anticoagulation. Seen by Dr. Alinda Money who recommends repeat imaging in the next 3 months by Dr. Risa Grill to further evaluate his complex renal cystic lesions, preferably with contrast. 4. HTN on norvasc/ coreg, bp's good 5. Anemia of CKD (+ ABLA from bleed) - On Aranesp 60/week last dosed 3/7. TSat 8% 2/25. Fe load ongoing. Started 2/28, ends 3/14, last dosed 3/7. 6. Hx bladder cancer 7. Renal cysts, some complex lesions - see above. Urology to reevaluate. 8. Systolic CHF - no cath for now per cardiology  9. DVT - anticoagulation reversed. Dr. Alinda Money makes good point about his chronic anticoagulation (as in why does he need with remote DVT hx).  .  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 Gets daily calcitriol 0.5. Slightly oversuppressed for CKD 5 (goal 150-300) and will be getting at his HD unit with each dialysis. Will change to 0.5 mcg TTS with HD. Continue sevelamer with meals. 11. DM - per primary service  12. Disposition: if we  can get clarification of his outpt HD today, could go home. Otherwise, 1st round HD tomorrow.  Albert Maes, MD [facility] [phone] Pager 12/18/2015, 2:28 PM

## 2015-12-19 DIAGNOSIS — N186 End stage renal disease: Secondary | ICD-10-CM | POA: Diagnosis not present

## 2015-12-19 DIAGNOSIS — D631 Anemia in chronic kidney disease: Secondary | ICD-10-CM | POA: Diagnosis not present

## 2015-12-19 DIAGNOSIS — N2581 Secondary hyperparathyroidism of renal origin: Secondary | ICD-10-CM | POA: Diagnosis not present

## 2015-12-19 DIAGNOSIS — D509 Iron deficiency anemia, unspecified: Secondary | ICD-10-CM | POA: Diagnosis not present

## 2015-12-19 DIAGNOSIS — E1129 Type 2 diabetes mellitus with other diabetic kidney complication: Secondary | ICD-10-CM | POA: Diagnosis not present

## 2015-12-21 DIAGNOSIS — D631 Anemia in chronic kidney disease: Secondary | ICD-10-CM | POA: Diagnosis not present

## 2015-12-21 DIAGNOSIS — D509 Iron deficiency anemia, unspecified: Secondary | ICD-10-CM | POA: Diagnosis not present

## 2015-12-21 DIAGNOSIS — N2581 Secondary hyperparathyroidism of renal origin: Secondary | ICD-10-CM | POA: Diagnosis not present

## 2015-12-21 DIAGNOSIS — N186 End stage renal disease: Secondary | ICD-10-CM | POA: Diagnosis not present

## 2015-12-21 DIAGNOSIS — E1129 Type 2 diabetes mellitus with other diabetic kidney complication: Secondary | ICD-10-CM | POA: Diagnosis not present

## 2015-12-24 DIAGNOSIS — N186 End stage renal disease: Secondary | ICD-10-CM | POA: Diagnosis not present

## 2015-12-24 DIAGNOSIS — N2581 Secondary hyperparathyroidism of renal origin: Secondary | ICD-10-CM | POA: Diagnosis not present

## 2015-12-24 DIAGNOSIS — D631 Anemia in chronic kidney disease: Secondary | ICD-10-CM | POA: Diagnosis not present

## 2015-12-24 DIAGNOSIS — D509 Iron deficiency anemia, unspecified: Secondary | ICD-10-CM | POA: Diagnosis not present

## 2015-12-24 DIAGNOSIS — E1129 Type 2 diabetes mellitus with other diabetic kidney complication: Secondary | ICD-10-CM | POA: Diagnosis not present

## 2015-12-26 DIAGNOSIS — D509 Iron deficiency anemia, unspecified: Secondary | ICD-10-CM | POA: Diagnosis not present

## 2015-12-26 DIAGNOSIS — N2581 Secondary hyperparathyroidism of renal origin: Secondary | ICD-10-CM | POA: Diagnosis not present

## 2015-12-26 DIAGNOSIS — D631 Anemia in chronic kidney disease: Secondary | ICD-10-CM | POA: Diagnosis not present

## 2015-12-26 DIAGNOSIS — E1129 Type 2 diabetes mellitus with other diabetic kidney complication: Secondary | ICD-10-CM | POA: Diagnosis not present

## 2015-12-26 DIAGNOSIS — N186 End stage renal disease: Secondary | ICD-10-CM | POA: Diagnosis not present

## 2015-12-27 ENCOUNTER — Ambulatory Visit
Admission: RE | Admit: 2015-12-27 | Discharge: 2015-12-27 | Disposition: A | Discharge status: Home / Self Care | Payer: Medicare Other | Source: Ambulatory Visit | Attending: Internal Medicine | Admitting: Internal Medicine

## 2015-12-27 ENCOUNTER — Other Ambulatory Visit: Payer: Self-pay | Admitting: Internal Medicine

## 2015-12-27 DIAGNOSIS — S299XXA Unspecified injury of thorax, initial encounter: Secondary | ICD-10-CM | POA: Diagnosis not present

## 2015-12-27 DIAGNOSIS — R0781 Pleurodynia: Secondary | ICD-10-CM

## 2015-12-27 DIAGNOSIS — I10 Essential (primary) hypertension: Secondary | ICD-10-CM | POA: Diagnosis not present

## 2015-12-27 DIAGNOSIS — I5020 Unspecified systolic (congestive) heart failure: Secondary | ICD-10-CM | POA: Diagnosis not present

## 2015-12-27 DIAGNOSIS — M79672 Pain in left foot: Secondary | ICD-10-CM | POA: Diagnosis not present

## 2015-12-27 DIAGNOSIS — N186 End stage renal disease: Secondary | ICD-10-CM | POA: Diagnosis not present

## 2015-12-27 DIAGNOSIS — R0789 Other chest pain: Secondary | ICD-10-CM | POA: Diagnosis not present

## 2015-12-27 DIAGNOSIS — M79671 Pain in right foot: Secondary | ICD-10-CM | POA: Diagnosis not present

## 2015-12-27 DIAGNOSIS — D638 Anemia in other chronic diseases classified elsewhere: Secondary | ICD-10-CM | POA: Diagnosis not present

## 2015-12-27 DIAGNOSIS — S37011D Minor contusion of right kidney, subsequent encounter: Secondary | ICD-10-CM | POA: Diagnosis not present

## 2015-12-28 DIAGNOSIS — N2581 Secondary hyperparathyroidism of renal origin: Secondary | ICD-10-CM | POA: Diagnosis not present

## 2015-12-28 DIAGNOSIS — D631 Anemia in chronic kidney disease: Secondary | ICD-10-CM | POA: Diagnosis not present

## 2015-12-28 DIAGNOSIS — E1129 Type 2 diabetes mellitus with other diabetic kidney complication: Secondary | ICD-10-CM | POA: Diagnosis not present

## 2015-12-28 DIAGNOSIS — N186 End stage renal disease: Secondary | ICD-10-CM | POA: Diagnosis not present

## 2015-12-28 DIAGNOSIS — D509 Iron deficiency anemia, unspecified: Secondary | ICD-10-CM | POA: Diagnosis not present

## 2015-12-30 DIAGNOSIS — M25552 Pain in left hip: Secondary | ICD-10-CM | POA: Diagnosis not present

## 2015-12-30 DIAGNOSIS — M25562 Pain in left knee: Secondary | ICD-10-CM | POA: Diagnosis not present

## 2015-12-30 DIAGNOSIS — M545 Low back pain: Secondary | ICD-10-CM | POA: Diagnosis not present

## 2015-12-30 DIAGNOSIS — M25512 Pain in left shoulder: Secondary | ICD-10-CM | POA: Diagnosis not present

## 2015-12-30 DIAGNOSIS — G894 Chronic pain syndrome: Secondary | ICD-10-CM | POA: Diagnosis not present

## 2015-12-30 DIAGNOSIS — Z79891 Long term (current) use of opiate analgesic: Secondary | ICD-10-CM | POA: Diagnosis not present

## 2015-12-31 DIAGNOSIS — N186 End stage renal disease: Secondary | ICD-10-CM | POA: Diagnosis not present

## 2015-12-31 DIAGNOSIS — E1129 Type 2 diabetes mellitus with other diabetic kidney complication: Secondary | ICD-10-CM | POA: Diagnosis not present

## 2015-12-31 DIAGNOSIS — D509 Iron deficiency anemia, unspecified: Secondary | ICD-10-CM | POA: Diagnosis not present

## 2015-12-31 DIAGNOSIS — D631 Anemia in chronic kidney disease: Secondary | ICD-10-CM | POA: Diagnosis not present

## 2015-12-31 DIAGNOSIS — N2581 Secondary hyperparathyroidism of renal origin: Secondary | ICD-10-CM | POA: Diagnosis not present

## 2016-01-02 DIAGNOSIS — E1129 Type 2 diabetes mellitus with other diabetic kidney complication: Secondary | ICD-10-CM | POA: Diagnosis not present

## 2016-01-02 DIAGNOSIS — D631 Anemia in chronic kidney disease: Secondary | ICD-10-CM | POA: Diagnosis not present

## 2016-01-02 DIAGNOSIS — D509 Iron deficiency anemia, unspecified: Secondary | ICD-10-CM | POA: Diagnosis not present

## 2016-01-02 DIAGNOSIS — N186 End stage renal disease: Secondary | ICD-10-CM | POA: Diagnosis not present

## 2016-01-02 DIAGNOSIS — N2581 Secondary hyperparathyroidism of renal origin: Secondary | ICD-10-CM | POA: Diagnosis not present

## 2016-01-03 DIAGNOSIS — N281 Cyst of kidney, acquired: Secondary | ICD-10-CM | POA: Diagnosis not present

## 2016-01-03 DIAGNOSIS — S37011A Minor contusion of right kidney, initial encounter: Secondary | ICD-10-CM | POA: Diagnosis not present

## 2016-01-03 DIAGNOSIS — N186 End stage renal disease: Secondary | ICD-10-CM | POA: Diagnosis not present

## 2016-01-04 DIAGNOSIS — N2581 Secondary hyperparathyroidism of renal origin: Secondary | ICD-10-CM | POA: Diagnosis not present

## 2016-01-04 DIAGNOSIS — D509 Iron deficiency anemia, unspecified: Secondary | ICD-10-CM | POA: Diagnosis not present

## 2016-01-04 DIAGNOSIS — N186 End stage renal disease: Secondary | ICD-10-CM | POA: Diagnosis not present

## 2016-01-04 DIAGNOSIS — E1129 Type 2 diabetes mellitus with other diabetic kidney complication: Secondary | ICD-10-CM | POA: Diagnosis not present

## 2016-01-04 DIAGNOSIS — D631 Anemia in chronic kidney disease: Secondary | ICD-10-CM | POA: Diagnosis not present

## 2016-01-07 DIAGNOSIS — D631 Anemia in chronic kidney disease: Secondary | ICD-10-CM | POA: Diagnosis not present

## 2016-01-07 DIAGNOSIS — D509 Iron deficiency anemia, unspecified: Secondary | ICD-10-CM | POA: Diagnosis not present

## 2016-01-07 DIAGNOSIS — N2581 Secondary hyperparathyroidism of renal origin: Secondary | ICD-10-CM | POA: Diagnosis not present

## 2016-01-07 DIAGNOSIS — E1129 Type 2 diabetes mellitus with other diabetic kidney complication: Secondary | ICD-10-CM | POA: Diagnosis not present

## 2016-01-07 DIAGNOSIS — N186 End stage renal disease: Secondary | ICD-10-CM | POA: Diagnosis not present

## 2016-01-09 DIAGNOSIS — D631 Anemia in chronic kidney disease: Secondary | ICD-10-CM | POA: Diagnosis not present

## 2016-01-09 DIAGNOSIS — N186 End stage renal disease: Secondary | ICD-10-CM | POA: Diagnosis not present

## 2016-01-09 DIAGNOSIS — N2581 Secondary hyperparathyroidism of renal origin: Secondary | ICD-10-CM | POA: Diagnosis not present

## 2016-01-09 DIAGNOSIS — D509 Iron deficiency anemia, unspecified: Secondary | ICD-10-CM | POA: Diagnosis not present

## 2016-01-09 DIAGNOSIS — E1129 Type 2 diabetes mellitus with other diabetic kidney complication: Secondary | ICD-10-CM | POA: Diagnosis not present

## 2016-01-10 ENCOUNTER — Ambulatory Visit (INDEPENDENT_AMBULATORY_CARE_PROVIDER_SITE_OTHER): Payer: Medicare Other | Admitting: Cardiology

## 2016-01-10 ENCOUNTER — Encounter: Payer: Self-pay | Admitting: Cardiology

## 2016-01-10 VITALS — BP 128/62 | HR 58 | Ht 71.0 in | Wt 226.8 lb

## 2016-01-10 DIAGNOSIS — I2510 Atherosclerotic heart disease of native coronary artery without angina pectoris: Secondary | ICD-10-CM

## 2016-01-10 DIAGNOSIS — N186 End stage renal disease: Secondary | ICD-10-CM

## 2016-01-10 DIAGNOSIS — I429 Cardiomyopathy, unspecified: Secondary | ICD-10-CM | POA: Diagnosis not present

## 2016-01-10 DIAGNOSIS — I5023 Acute on chronic systolic (congestive) heart failure: Secondary | ICD-10-CM | POA: Diagnosis not present

## 2016-01-10 DIAGNOSIS — S37011D Minor contusion of right kidney, subsequent encounter: Secondary | ICD-10-CM

## 2016-01-10 DIAGNOSIS — Z992 Dependence on renal dialysis: Secondary | ICD-10-CM | POA: Diagnosis not present

## 2016-01-10 DIAGNOSIS — Z86718 Personal history of other venous thrombosis and embolism: Secondary | ICD-10-CM

## 2016-01-10 DIAGNOSIS — E1122 Type 2 diabetes mellitus with diabetic chronic kidney disease: Secondary | ICD-10-CM | POA: Diagnosis not present

## 2016-01-10 NOTE — Patient Instructions (Signed)
Continue same medications   Your physician recommends that you schedule a follow-up appointment in: 3 months with Dr.McLean

## 2016-01-10 NOTE — Progress Notes (Signed)
Cardiology Office Note   Date:  01/11/2016   ID:  Albert Stanley, DOB 04-08-1946, MRN [id number]  PCP:  [name], MD  Cardiologist:  Dr. Marigene Ehlers   Chief Complaint  Patient presents with  . Hospitalization Follow-up    no chest pain       History of Present Illness: Albert Stanley is a 70 y.o. male who presents for post hospital follow up.     He has a history of CKD, bladder cancer, DM, DVT who was on warfarin.  Also with known CAD with cath 2002, LAD 25%, no other dz, EF nl.  Admtted 12/04/2015 with right flank pain and was found to have right renal subcapsular hematoma, INR 2.15. He was also noted to have worsening renal failure, seen by nephrology. Echocardiogram revealed new systolic CHF EF 123456.   There was hypokinesis of the inferolateral inferoseptal myocardium.  Cardiology was consulted.  He has never had chest pain, denies PND, sleeps on 2 pillows chronically. Breathing worsened after admission, but is better today. Still w/  LE edema but improved.  He is having dialysis on T-Th-Sat.  He did have one day post dialysis that he had syncope due to hypotension.  Otherwise he has no complaints.  He does admit to be very tired after dialysis.     Past Medical History  Diagnosis Date  . Hypertension   . Diabetes mellitus without complication ([place])   . Asthma   . Hyperlipidemia   . DVT (deep venous thrombosis) ([place])   . Renal insufficiency   . Bladder cancer ([place]) dx'd 1990    surg only    Past Surgical History  Procedure Laterality Date  . Back surgery      2 times  . Joint replacement      hip  . Arthroscopic knee surgery    . Cystoscopy    . Av fistula placement Left 12/13/2015    Procedure: ARTERIOVENOUS (AV) FISTULA CREATION;  Surgeon: Angelia Mould, MD;  Location: [place];  Service: Vascular;  Laterality: Left;  . Insertion of dialysis catheter N/A 12/13/2015    Procedure: INSERTION OF DIALYSIS CATHETER;  Surgeon: Angelia Mould,  MD;  Location: [place];  Service: Vascular;  Laterality: N/A;     Current Outpatient Prescriptions  Medication Sig Dispense Refill  . amLODipine (NORVASC) 10 MG tablet Take 5 mg by mouth daily.   0  . atorvastatin (LIPITOR) 40 MG tablet Take 1 tablet (40 mg total) by mouth daily at 6 PM. 30 tablet 0  . BAYER BREEZE 2 TEST DISK Apply 1 each topically as directed.   0  . calcitRIOL (ROCALTROL) 0.5 MCG capsule Take 0.5 mcg by mouth daily.   1  . carvedilol (COREG) 6.25 MG tablet Take 1 tablet (6.25 mg total) by mouth 2 (two) times daily with a meal. 60 tablet 0  . cyclobenzaprine (FLEXERIL) 5 MG tablet Take 5 mg by mouth 3 (three) times daily as needed for muscle spasms.    . famotidine (PEPCID) 20 MG tablet Take 1 tablet (20 mg total) by mouth 2 (two) times daily as needed for heartburn.    . fluticasone (FLONASE) 50 MCG/ACT nasal spray Place 1 spray into both nostrils daily as needed for allergies or rhinitis.    Marland Kitchen glipiZIDE (GLUCOTROL) 5 MG tablet Take 1 tablet (5 mg total) by mouth daily before breakfast. 30 tablet 0  . multivitamin (RENA-VIT) TABS tablet Take 2 tablets by mouth 3 (three) times  daily.   0  . oxyCODONE (OXY IR/ROXICODONE) 5 MG immediate release tablet Take 1 tablet (5 mg total) by mouth every 4 (four) hours as needed for moderate pain. 15 tablet 0  . sevelamer carbonate (RENVELA) 800 MG tablet Take 2 tablets (1,600 mg total) by mouth 3 (three) times daily with meals. 90 tablet 0   No current facility-administered medications for this visit.    Allergies:   Eggs or egg-derived products; Foltx ; Meperidine; Meperidine hcl; Penicillin g; Penicillins; Povidone iodine; Sulfa antibiotics; Sulfonamide derivatives; and Povidone-iodine    Social History:  The patient  reports that he has quit smoking. He has never used smokeless tobacco. He reports that he does not drink alcohol or use illicit drugs.   Family History:  The patient's family history includes Alcohol abuse in his  brother; Arthritis in his mother; Cancer in his father; Diabetes in his daughter; Heart attack in his paternal uncle; Heart disease in his father; Hypertension in his sister.    ROS:  General:no colds or fevers, + weight decrease with dialysis Skin:no rashes or ulcers HEENT:no blurred vision, no congestion CV:see HPI PUL:see HPI GI:no diarrhea constipation or melena, no indigestion GU:no hematuria, no dysuria MS:no joint pain, no claudication Neuro:no syncope, no lightheadedness Endo:+ diabetes stable per pt., no thyroid disease  Wt Readings from Last 3 Encounters:  01/10/16 226 lb 12.8 oz (102.876 kg)  12/17/15 232 lb 12.9 oz (105.6 kg)  02/06/15 244 lb (110.678 kg)     PHYSICAL EXAM: VS:  BP 128/62 mmHg  Pulse 58  Ht 5\' 11"  (1.803 m)  Wt 226 lb 12.8 oz (102.876 kg)  BMI 31.65 kg/m2 , BMI Body mass index is 31.65 kg/(m^2). General:Pleasant affect, NAD Skin:Warm and dry, brisk capillary refill HEENT:normocephalic, sclera clear, mucus membranes moist Neck:supple, no JVD, no bruits  Heart:S1S2 RRR without murmur, gallup, rub or click Lungs:clear without rales, rhonchi, or wheezes [id number], non tender, + BS, do not palpate liver spleen or masses Ext:no lower ext edema, 2+ pedal pulses, 2+ radial pulses Neuro:alert and oriented, MAE, follows commands, + facial symmetry    EKG:  EKG is NOT ordered today.    Recent Labs: 02/06/2015: B Natriuretic Peptide 53.4 12/12/2015: Magnesium 2.3 12/16/2015: ALT 15* 12/18/2015: BUN 47*; Creatinine, Ser 5.04*; Hemoglobin 8.3*; Platelets 262; Potassium 4.5; Sodium 132*    Lipid Panel No results found for: CHOL, TRIG, HDL, CHOLHDL, VLDL, LDLCALC, LDLDIRECT     Other studies Reviewed: Additional studies/ records that were reviewed today include: hospital notes.  Echo: 12/05/2015 - Left ventricle: The cavity size was mildly dilated. There was moderate concentric hypertrophy. Systolic function was moderately reduced. The estimated  ejection fraction was in the range of 35% to 40%. Hypokinesis of the inferolateral and inferoseptal myocardium. - Aortic valve: There was trivial regurgitation. - Mitral valve: There was moderate regurgitation. - Left atrium: The atrium was severely dilated. - Pericardium, extracardiac: A small pericardial effusion was identified.  ECHO: 08/04/2010 - Left ventricle: The cavity size was normal. Wall thickness was  normal. Systolic function was normal. The estimated ejection  fraction was in the range of 60% to 65%. Wall motion was normal;  there were no regional wall motion abnormalities. - Left atrium: The atrium was mildly dilated. - Pulmonary arteries: PA peak pressure: 26mm Hg (S).  ECG: 12/06/2015 SR, HR 94 No sig change from 02/2015   ASSESSMENT AND PLAN:  1. Cardiomyopathy with new change in EF to 35-40%  ---unsure if ischemic or non  ischemic has hx of CAD and has DM.  Now on dialysis pt could have cardiac cath.  Will check with Dr. Angelena Form if we proceed with cath vs. nuc study.    Pt will follow up with [name] depending on Dr. Camillia Herter recommendations.   2.. CORONARY ATHEROSCLEROSIS NATIVE CORONARY ARTERY - cath 2002, LAD 25%, no other dz, EF nl.  3. Renal hematoma, right - per IM/Renal - H&H 8.0/24.7 on admission, was 9.5/25.2 in 02/2015 followed by renal  4. CKD (chronic kidney disease), stage IV ([name])   ARF (acute renal failure) ([name]) - baseline Cr 3-4 - Renal MD is Dr Florene Glen - is now on dialysis T-Th- Sat.  5. Normocytic anemia due to chronic renal disease-   6. Anticoagulant long-term use  History of DVT (deep vein thrombosis) - INR 2.15 on admission now off coumadin resume per PCP  7. Acute systolic CHF (congestive heart failure) ([place]) now resolved with dialysis   8. hyperlipidemia on statin  Current medicines are reviewed with the patient today.  The patient Has no concerns regarding medicines.  The following changes have  been made:  See above Labs/ tests ordered today include:see above  Disposition:   FU:  see above  Lennie Muckle, NP  01/11/2016 3:46 PM    [facility] [address] [place] [place], [place] Phone: [phone]; Fax: [phone]

## 2016-01-11 ENCOUNTER — Encounter: Payer: Self-pay | Admitting: Cardiology

## 2016-01-11 DIAGNOSIS — D631 Anemia in chronic kidney disease: Secondary | ICD-10-CM | POA: Diagnosis not present

## 2016-01-11 DIAGNOSIS — Z23 Encounter for immunization: Secondary | ICD-10-CM | POA: Diagnosis not present

## 2016-01-11 DIAGNOSIS — D509 Iron deficiency anemia, unspecified: Secondary | ICD-10-CM | POA: Diagnosis not present

## 2016-01-11 DIAGNOSIS — N2581 Secondary hyperparathyroidism of renal origin: Secondary | ICD-10-CM | POA: Diagnosis not present

## 2016-01-11 DIAGNOSIS — N186 End stage renal disease: Secondary | ICD-10-CM | POA: Diagnosis not present

## 2016-01-11 DIAGNOSIS — E1129 Type 2 diabetes mellitus with other diabetic kidney complication: Secondary | ICD-10-CM | POA: Diagnosis not present

## 2016-01-13 ENCOUNTER — Telehealth: Payer: Self-pay | Admitting: Cardiology

## 2016-01-13 NOTE — Telephone Encounter (Signed)
Spoke with pt and he is unable to come in the Wednesday due to previously scheduled appts. Pt would like to see Mickel Baas again but due to dialysis can not come next week. Pt states that he is available to come see her on 4/17 or 4/19. Advised pt that I would route her a message but I believe she would want him seen sooner.  Will route to Cecilie Kicks, NP for review on whether ok to wait until 4/17 or if pt needs to see another provider.

## 2016-01-13 NOTE — Telephone Encounter (Signed)
Albert Stanley, please have pt come to my flex day on wed. This week to discuss cardiac cath. Pt could have on Friday he is dialysis on tues, th, sat. Pt wanted to come back and discuss.         Thanks.     ----- Message -----     From: Burnell Blanks, MD     Sent: 01/13/2016  8:47 AM      To: Albert Serge, NP        Mickel Baas, I think I would move toward cath with him if he is agreeable. Gerald Stabs        ----- Message -----     From: Albert Serge, NP     Sent: 01/11/2016  4:15 PM      To: Burnell Blanks, MD        Saw [name] for follow up. He has new EF 35-40% and known CAD from cath 2002 minimal then. +DM and hyperlipidemia. Now on dialysis and SOB improved. ? Arrange cath vs. Nuc? I believe when you saw in consult you were thinking cath. DM is his cardiologist but you are only MD that has seen since 2011. My plan was to get your recommendation and then in follow up with [name]. Thanks.        Left message to call back

## 2016-01-13 NOTE — Telephone Encounter (Signed)
Spoke with pt and he was agreeable to come in sooner. Scheduled pt to see Lyda Jester, PA-C on 4/7. Pt verbalized understanding and was in agreement with this plan.

## 2016-01-13 NOTE — Telephone Encounter (Signed)
Better to see someone earlier, it is just to review cardiac cath - he is stable but would prefer to do earlier than 17t.  Thanks.

## 2016-01-14 ENCOUNTER — Encounter: Payer: Self-pay | Admitting: Vascular Surgery

## 2016-01-14 DIAGNOSIS — D509 Iron deficiency anemia, unspecified: Secondary | ICD-10-CM | POA: Diagnosis not present

## 2016-01-14 DIAGNOSIS — Z23 Encounter for immunization: Secondary | ICD-10-CM | POA: Diagnosis not present

## 2016-01-14 DIAGNOSIS — E1129 Type 2 diabetes mellitus with other diabetic kidney complication: Secondary | ICD-10-CM | POA: Diagnosis not present

## 2016-01-14 DIAGNOSIS — N2581 Secondary hyperparathyroidism of renal origin: Secondary | ICD-10-CM | POA: Diagnosis not present

## 2016-01-14 DIAGNOSIS — D631 Anemia in chronic kidney disease: Secondary | ICD-10-CM | POA: Diagnosis not present

## 2016-01-14 DIAGNOSIS — N186 End stage renal disease: Secondary | ICD-10-CM | POA: Diagnosis not present

## 2016-01-15 DIAGNOSIS — G8929 Other chronic pain: Secondary | ICD-10-CM | POA: Diagnosis not present

## 2016-01-15 DIAGNOSIS — M545 Low back pain: Secondary | ICD-10-CM | POA: Diagnosis not present

## 2016-01-15 DIAGNOSIS — M25552 Pain in left hip: Secondary | ICD-10-CM | POA: Diagnosis not present

## 2016-01-16 DIAGNOSIS — D509 Iron deficiency anemia, unspecified: Secondary | ICD-10-CM | POA: Diagnosis not present

## 2016-01-16 DIAGNOSIS — N186 End stage renal disease: Secondary | ICD-10-CM | POA: Diagnosis not present

## 2016-01-16 DIAGNOSIS — Z23 Encounter for immunization: Secondary | ICD-10-CM | POA: Diagnosis not present

## 2016-01-16 DIAGNOSIS — E1129 Type 2 diabetes mellitus with other diabetic kidney complication: Secondary | ICD-10-CM | POA: Diagnosis not present

## 2016-01-16 DIAGNOSIS — D631 Anemia in chronic kidney disease: Secondary | ICD-10-CM | POA: Diagnosis not present

## 2016-01-16 DIAGNOSIS — N2581 Secondary hyperparathyroidism of renal origin: Secondary | ICD-10-CM | POA: Diagnosis not present

## 2016-01-17 ENCOUNTER — Encounter: Payer: Self-pay | Admitting: Cardiology

## 2016-01-17 ENCOUNTER — Ambulatory Visit (INDEPENDENT_AMBULATORY_CARE_PROVIDER_SITE_OTHER): Payer: Medicare Other | Admitting: Cardiology

## 2016-01-17 ENCOUNTER — Ambulatory Visit (HOSPITAL_COMMUNITY)
Admit: 2016-01-17 | Discharge: 2016-01-17 | Disposition: A | Discharge status: Home / Self Care | Payer: Medicare Other | Source: Ambulatory Visit | Attending: Vascular Surgery | Admitting: Vascular Surgery

## 2016-01-17 VITALS — BP 130/68 | HR 66 | Ht 71.0 in | Wt 227.2 lb

## 2016-01-17 DIAGNOSIS — N186 End stage renal disease: Secondary | ICD-10-CM | POA: Insufficient documentation

## 2016-01-17 DIAGNOSIS — I5023 Acute on chronic systolic (congestive) heart failure: Secondary | ICD-10-CM

## 2016-01-17 DIAGNOSIS — Z4931 Encounter for adequacy testing for hemodialysis: Secondary | ICD-10-CM | POA: Insufficient documentation

## 2016-01-17 DIAGNOSIS — I429 Cardiomyopathy, unspecified: Secondary | ICD-10-CM

## 2016-01-17 DIAGNOSIS — I2510 Atherosclerotic heart disease of native coronary artery without angina pectoris: Secondary | ICD-10-CM | POA: Diagnosis not present

## 2016-01-17 NOTE — Progress Notes (Signed)
01/17/2016 Albert Stanley   1945-11-29  [id number]  Primary Physician [name], MD Primary Cardiologist: [name]   Reason for Visit/CC:  Abnormal Echo; New Systolic HF   HPI:  The patient is a 70 year old African-American male with a history of CAD, chronic kidney disease on hemodialysis Tuesday, Thursday, Saturday, history of bladder cancer, diabetes and DVT.  He was previously on warfarin but this was recently discontinued. In 2002, he had a cath which showed 25% LAD disease but no other CAD. EF at that time was normal.  He was recently admitted 12/04/2015 with right flank pain and was found to have right renal subcapsular hematoma. INR was 2.15. His warfarin was discontinued. He was also noted to have worsening renal failure and was seen by nephrology. Echocardiogram revealed new systolic CHF EF 123456. There was hypokinesis of the inferolateral inferoseptal myocardium. At the time of his hospitalization, he was considered not a candidate for cardiac catheterization given his chronic kidney disease. This was before he started hemodialysis. However he is now on hemodialysis 3 days a week which is followed by [name]. He now presents to clinic to discuss options to evaluate for CAD.  He reporrts that he has done well. He denies any anginal symptoms. No chest pain or dyspnea. No exertional limitations. His volume is controlled through hemodialysis. We discussed the potential risk of cardiac catheterization. The patient has concerns given these risks. We discussed left heart catheterization versus noninvasive testing with nuclear stress test. The patient would prefer to start with noninvasive nuclear stress test first as opposed to left heart catheterization at this time. I discussed scenario with [name], DOD, who feels that this approach is reasonable.    Current Outpatient Prescriptions  Medication Sig Dispense Refill  . amLODipine (NORVASC) 10 MG tablet Take 5 mg by  mouth daily.   0  . BAYER BREEZE 2 TEST DISK Apply 1 each topically as directed.   0  . calcitRIOL (ROCALTROL) 0.5 MCG capsule Take 0.5 mcg by mouth daily.   1  . carvedilol (COREG) 6.25 MG tablet Take 1 tablet (6.25 mg total) by mouth 2 (two) times daily with a meal. 60 tablet 0  . cyclobenzaprine (FLEXERIL) 5 MG tablet Take 5 mg by mouth 3 (three) times daily as needed for muscle spasms.    . famotidine (PEPCID) 20 MG tablet Take 1 tablet (20 mg total) by mouth 2 (two) times daily as needed for heartburn.    . fluticasone (FLONASE) 50 MCG/ACT nasal spray Place 1 spray into both nostrils daily as needed for allergies or rhinitis.    Marland Kitchen glipiZIDE (GLUCOTROL) 5 MG tablet Take 1 tablet (5 mg total) by mouth daily before breakfast. 30 tablet 0  . multivitamin (RENA-VIT) TABS tablet Take 2 tablets by mouth 3 (three) times daily.   0  . oxyCODONE-acetaminophen (PERCOCET) 7.5-325 MG tablet Take 1 tablet by mouth every 4 (four) hours as needed for severe pain.    . sevelamer carbonate (RENVELA) 800 MG tablet Take 2 tablets (1,600 mg total) by mouth 3 (three) times daily with meals. 90 tablet 0   No current facility-administered medications for this visit.    Allergies  Allergen Reactions  . Eggs Or Egg-Derived Products Other (See Comments)    unknown  . Foltx [Elfolate Plus] Other (See Comments)    unknown  . Meperidine Other (See Comments)  . Meperidine Hcl     Unknown  . Penicillin G Other (See Comments)  .  Penicillins     "Knocks me out."   Has patient had a PCN reaction causing immediate rash, facial/tongue/throat swelling, SOB or lightheadedness with hypotension: yes- blacked out Has patient had a PCN reaction causing severe rash involving mucus membranes or skin necrosis: unknown Has patient had a PCN reaction that required hospitalization: at MD office Has patient had a PCN reaction occurring within the last 10 years: no If all of the above answers are "NO", then may proceed with  Cephalosporin use.   . Povidone Iodine Other (See Comments)    unknown  . Sulfa Antibiotics Other (See Comments)    unknown  . Sulfonamide Derivatives     Unknown.   . Povidone-Iodine Rash    Social History   Social History  . Marital Status: Married    Spouse Name: N/A  . Number of Children: N/A  . Years of Education: N/A   Occupational History  . Retired    Social History Main Topics  . Smoking status: Former Research scientist (life sciences)  . Smokeless tobacco: Never Used  . Alcohol Use: No     Comment: Used to drink on the weekends, quit 2 years ago  . Drug Use: No  . Sexual Activity: No   Other Topics Concern  . Not on file   Social History Narrative   Lives with wife and daughter.     Review of Systems: General: negative for chills, fever, night sweats or weight changes.  Cardiovascular: negative for chest pain, dyspnea on exertion, edema, orthopnea, palpitations, paroxysmal nocturnal dyspnea or shortness of breath Dermatological: negative for rash Respiratory: negative for cough or wheezing Urologic: negative for hematuria Abdominal: negative for nausea, vomiting, diarrhea, bright red blood per rectum, melena, or hematemesis Neurologic: negative for visual changes, syncope, or dizziness All other systems reviewed and are otherwise negative except as noted above.    Blood pressure 130/68, pulse 66, height 5\' 11"  (1.803 m), weight 227 lb 3.2 oz (103.057 kg), SpO2 98 %.  General appearance: alert, cooperative and no distress Neck: no carotid bruit and no JVD Lungs: clear to auscultation bilaterally Heart: regular rate and rhythm, S1, S2 normal, no murmur, click, rub or gallop Extremities: 1+ bilateral LEE Pulses: 2+ and symmetric Skin: warm and dry Neurologic: Grossly normal  EKG not performed  ASSESSMENT AND PLAN:   1. Chronic Systolic HF: newly diagnosed with EF down at 35-40%. He is asymptomatic. His volume is controlled through HD. We discussed need to further assess if  HF is from ischemic vs nonischemic causes. As outlined above, I presented patient with option of initial testing with noninvasive stress test vs definitive LHC. After weighing the options, including potential risk, the patient opted to first proceed with NST. This was reviewed with [name], DOD, who feels that this approach is reasonable. The patient knows that if NST is abnormal, LHC will be advised. Continue Coreg for HF. No ACE/ARB given CKD. Can further titrate meds, possibly addition of nitrate + Imdur, at future f/u if BP allows.   2. H/o CAD: nonosbtructive at time of LHC in 2002. However now with new systolic HF. Stress test has been ordered to assess for ischemia. He will need cath is abnormal nuclear study.   3. CKD: on HD T, TH, Sat.  PLAN  F/u after NST to discuss cath vs medication optimization.   Lyda Jester PA-C 01/17/2016 9:50 AM

## 2016-01-17 NOTE — Patient Instructions (Addendum)
Medication Instructions:  Your physician recommends that you continue on your current medications as directed. Please refer to the Current Medication list given to you today.   Labwork: None ordered  Testing/Procedures: Your physician has requested that you have a lexiscan myoview. For further information please visit [email or web address]. Please follow instruction sheet, as given.   Follow-Up: Your physician recommends that you schedule a follow-up appointment in: 2 WEEKS WITH DR. Antonietta Breach, NP, OR Marye Round SIMMONS, PA-C   Any Other Special Instructions Will Be Listed Below (If Applicable).  Pharmacologic Stress Electrocardiogram A pharmacologic stress electrocardiogram is a heart (cardiac) test that uses nuclear imaging to evaluate the blood supply to your heart. This test may also be called a pharmacologic stress electrocardiography. Pharmacologic means that a medicine is used to increase your heart rate and blood pressure.  This stress test is done to find areas of poor blood flow to the heart by determining the extent of coronary artery disease (CAD). Some people exercise on a treadmill, which naturally increases the blood flow to the heart. For those people unable to exercise on a treadmill, a medicine is used. This medicine stimulates your heart and will cause your heart to beat harder and more quickly, as if you were exercising.  Pharmacologic stress tests can help determine:  The adequacy of blood flow to your heart during increased levels of activity in order to clear you for discharge home.  The extent of coronary artery blockage caused by CAD.  Your prognosis if you have suffered a heart attack.  The effectiveness of cardiac procedures done, such as an angioplasty, which can increase the circulation in your coronary arteries.  Causes of chest pain or pressure. LET [facility] CARE PROVIDER KNOW ABOUT:  Any allergies you have.  All medicines you are taking,  including vitamins, herbs, eye drops, creams, and over-the-counter medicines.  Previous problems you or members of your family have had with the use of anesthetics.  Any blood disorders you have.  Previous surgeries you have had.  Medical conditions you have.  Possibility of pregnancy, if this applies.  If you are currently breastfeeding. RISKS AND COMPLICATIONS Generally, this is a safe procedure. However, as with any procedure, complications can occur. Possible complications include:  You develop pain or pressure in the following areas:  Chest.  Jaw or neck.  Between your shoulder blades.  Radiating down your left arm.  Headache.  Dizziness or light-headedness.  Shortness of breath.  Increased or irregular heartbeat.  Low blood pressure.  Nausea or vomiting.  Flushing.  Redness going up the arm and slight pain during injection of medicine.  Heart attack (rare). BEFORE THE PROCEDURE   Avoid all forms of caffeine for 24 hours before your test or as directed by your health care provider. This includes coffee, tea (even decaffeinated tea), caffeinated sodas, chocolate, cocoa, and certain pain medicines.  Follow your health care provider's instructions regarding eating and drinking before the test.  Take your medicines as directed at regular times with water unless instructed otherwise. Exceptions may include:  If you have diabetes, ask how you are to take your insulin or pills. It is common to adjust insulin dosing the morning of the test.  If you are taking beta-blocker medicines, it is important to talk to your health care provider about these medicines well before the date of your test. Taking beta-blocker medicines may interfere with the test. In some cases, these medicines need to be changed or stopped 24 hours  or more before the test.  If you wear a nitroglycerin patch, it may need to be removed prior to the test. Ask your health care provider if the patch  should be removed before the test.  If you use an inhaler for any breathing condition, bring it with you to the test.  If you are an outpatient, bring a snack so you can eat right after the stress phase of the test.  Do not smoke for 4 hours prior to the test or as directed by your health care provider.  Do not apply lotions, powders, creams, or oils on your chest prior to the test.  Wear comfortable shoes and clothing. Let your health care provider know if you were unable to complete or follow the preparations for your test. PROCEDURE   Multiple patches (electrodes) will be put on your chest. If needed, small areas of your chest may be shaved to get better contact with the electrodes. Once the electrodes are attached to your body, multiple wires will be attached to the electrodes, and your heart rate will be monitored.  An IV access will be started. A nuclear trace (isotope) is given. The isotope may be given intravenously, or it may be swallowed. Nuclear refers to several types of radioactive isotopes, and the nuclear isotope lights up the arteries so that the nuclear images are clear. The isotope is absorbed by your body. This results in low radiation exposure.  A resting nuclear image is taken to show how your heart functions at rest.  A medicine is given through the IV access.  A second scan is done about 1 hour after the medicine injection and determines how your heart functions under stress.  During this stress phase, you will be connected to an electrocardiogram machine. Your blood pressure and oxygen levels will be monitored. AFTER THE PROCEDURE   Your heart rate and blood pressure will be monitored after the test.  You may return to your normal schedule, including diet,activities, and medicines, unless your health care provider tells you otherwise.   This information is not intended to replace advice given to you by your health care provider. Make sure you discuss any  questions you have with your health care provider.   Document Released: 02/14/2009 Document Revised: 10/03/2013 Document Reviewed: 06/05/2013 Elsevier Interactive Patient Education [employer].

## 2016-01-18 DIAGNOSIS — N186 End stage renal disease: Secondary | ICD-10-CM | POA: Diagnosis not present

## 2016-01-18 DIAGNOSIS — D509 Iron deficiency anemia, unspecified: Secondary | ICD-10-CM | POA: Diagnosis not present

## 2016-01-18 DIAGNOSIS — D631 Anemia in chronic kidney disease: Secondary | ICD-10-CM | POA: Diagnosis not present

## 2016-01-18 DIAGNOSIS — E1129 Type 2 diabetes mellitus with other diabetic kidney complication: Secondary | ICD-10-CM | POA: Diagnosis not present

## 2016-01-18 DIAGNOSIS — Z23 Encounter for immunization: Secondary | ICD-10-CM | POA: Diagnosis not present

## 2016-01-18 DIAGNOSIS — N2581 Secondary hyperparathyroidism of renal origin: Secondary | ICD-10-CM | POA: Diagnosis not present

## 2016-01-21 DIAGNOSIS — D509 Iron deficiency anemia, unspecified: Secondary | ICD-10-CM | POA: Diagnosis not present

## 2016-01-21 DIAGNOSIS — D631 Anemia in chronic kidney disease: Secondary | ICD-10-CM | POA: Diagnosis not present

## 2016-01-21 DIAGNOSIS — Z23 Encounter for immunization: Secondary | ICD-10-CM | POA: Diagnosis not present

## 2016-01-21 DIAGNOSIS — N2581 Secondary hyperparathyroidism of renal origin: Secondary | ICD-10-CM | POA: Diagnosis not present

## 2016-01-21 DIAGNOSIS — N186 End stage renal disease: Secondary | ICD-10-CM | POA: Diagnosis not present

## 2016-01-21 DIAGNOSIS — E1129 Type 2 diabetes mellitus with other diabetic kidney complication: Secondary | ICD-10-CM | POA: Diagnosis not present

## 2016-01-22 ENCOUNTER — Ambulatory Visit (INDEPENDENT_AMBULATORY_CARE_PROVIDER_SITE_OTHER): Payer: Medicare Other | Admitting: Vascular Surgery

## 2016-01-22 ENCOUNTER — Encounter: Payer: Self-pay | Admitting: Vascular Surgery

## 2016-01-22 ENCOUNTER — Telehealth (HOSPITAL_COMMUNITY): Payer: Self-pay | Admitting: *Deleted

## 2016-01-22 VITALS — BP 150/80 | HR 77 | Temp 98.8°F | Ht 71.0 in | Wt 226.6 lb

## 2016-01-22 DIAGNOSIS — N184 Chronic kidney disease, stage 4 (severe): Secondary | ICD-10-CM

## 2016-01-22 NOTE — Progress Notes (Signed)
   Patient name: Albert Stanley MRN: [id number] DOB: 1945-12-18 Sex: male  REASON FOR VISIT: Follow up after left brachiocephalic AV fistula  HPI: Albert Stanley is a 70 y.o. male who underwent placement of a tunneled dialysis catheter and placement of a left brachiocephalic AV fistula on [id number]. He comes in for a 6 week follow up visit. He has no specific complaints. His catheter is working well for dialysis.  Current Outpatient Prescriptions  Medication Sig Dispense Refill  . BAYER BREEZE 2 TEST DISK Apply 1 each topically as directed.   0  . calcitRIOL (ROCALTROL) 0.5 MCG capsule Take 0.5 mcg by mouth daily.   1  . carvedilol (COREG) 6.25 MG tablet Take 1 tablet (6.25 mg total) by mouth 2 (two) times daily with a meal. 60 tablet 0  . cyclobenzaprine (FLEXERIL) 5 MG tablet Take 5 mg by mouth 3 (three) times daily as needed for muscle spasms.    . famotidine (PEPCID) 20 MG tablet Take 1 tablet (20 mg total) by mouth 2 (two) times daily as needed for heartburn.    . fluticasone (FLONASE) 50 MCG/ACT nasal spray Place 1 spray into both nostrils daily as needed for allergies or rhinitis.    Marland Kitchen glipiZIDE (GLUCOTROL) 5 MG tablet Take 1 tablet (5 mg total) by mouth daily before breakfast. 30 tablet 0  . multivitamin (RENA-VIT) TABS tablet Take 2 tablets by mouth 3 (three) times daily.   0  . oxyCODONE-acetaminophen (PERCOCET) 7.5-325 MG tablet Take 1 tablet by mouth every 4 (four) hours as needed for severe pain.    . sevelamer carbonate (RENVELA) 800 MG tablet Take 2 tablets (1,600 mg total) by mouth 3 (three) times daily with meals. 90 tablet 0  . amLODipine (NORVASC) 10 MG tablet Take 5 mg by mouth daily. Reported on 01/22/2016  0   No current facility-administered medications for this visit.    REVIEW OF SYSTEMS:  [X]  denotes positive finding, [ ]  denotes negative finding Cardiac  Comments:  Chest pain or chest pressure:    Shortness of breath upon exertion:    Short of breath when  lying flat:    Irregular heart rhythm:    Constitutional    Fever or chills:      PHYSICAL EXAM: Filed Vitals:   01/22/16 0850  Height: 5\' 11"  (1.803 m)  Weight: 226 lb 9.6 oz (102.785 kg)    GENERAL: The patient is a well-nourished male, in no acute distress. The vital signs are documented above. CARDIOVASCULAR: There is a regular rate and rhythm. PULMONARY: There is good air exchange bilaterally without wheezing or rales. His left upper arm fistula has an excellent thrill. He has a palpable left radial pulse.  DUPLEX AV FISTULA: the duplex that was performed on 01/17/2016 shows that the diameters of the fistula range from 0.59-0.97 cm.  MEDICAL ISSUES:  STATUS POST LEFT BRACHIOCEPHALIC AV FISTULA: His fistula appears to be maturing nicely. It should be ready for use an approximate 6 weeks. I will see him back as needed.  HYPERTENSION: The patient's initial blood pressure today was elevated. We repeated this and this was still elevated. We have encouraged the patient to follow up with their primary care physician for management of their blood pressure.   [name] [facility]: [phone]

## 2016-01-22 NOTE — Telephone Encounter (Signed)
Patient given detailed instructions per Myocardial Perfusion Study Information Sheet for the test on 01/27/16 Patient notified to arrive 15 minutes early and that it is imperative to arrive on time for appointment to keep from having the test rescheduled.  If you need to cancel or reschedule your appointment, please call the office within 24 hours of your appointment. Failure to do so may result in a cancellation of your appointment, and a $50 no show fee. Patient verbalized understanding.Axelle Szwed J Lynnleigh Soden, RN  

## 2016-01-23 DIAGNOSIS — Z23 Encounter for immunization: Secondary | ICD-10-CM | POA: Diagnosis not present

## 2016-01-23 DIAGNOSIS — D509 Iron deficiency anemia, unspecified: Secondary | ICD-10-CM | POA: Diagnosis not present

## 2016-01-23 DIAGNOSIS — N186 End stage renal disease: Secondary | ICD-10-CM | POA: Diagnosis not present

## 2016-01-23 DIAGNOSIS — E1129 Type 2 diabetes mellitus with other diabetic kidney complication: Secondary | ICD-10-CM | POA: Diagnosis not present

## 2016-01-23 DIAGNOSIS — D631 Anemia in chronic kidney disease: Secondary | ICD-10-CM | POA: Diagnosis not present

## 2016-01-23 DIAGNOSIS — M109 Gout, unspecified: Secondary | ICD-10-CM | POA: Diagnosis not present

## 2016-01-23 DIAGNOSIS — N2581 Secondary hyperparathyroidism of renal origin: Secondary | ICD-10-CM | POA: Diagnosis not present

## 2016-01-24 ENCOUNTER — Ambulatory Visit (HOSPITAL_COMMUNITY): Payer: Medicare Other | Attending: Cardiovascular Disease

## 2016-01-24 DIAGNOSIS — R9439 Abnormal result of other cardiovascular function study: Secondary | ICD-10-CM | POA: Insufficient documentation

## 2016-01-24 DIAGNOSIS — E1122 Type 2 diabetes mellitus with diabetic chronic kidney disease: Secondary | ICD-10-CM | POA: Insufficient documentation

## 2016-01-24 DIAGNOSIS — I2510 Atherosclerotic heart disease of native coronary artery without angina pectoris: Secondary | ICD-10-CM | POA: Insufficient documentation

## 2016-01-24 DIAGNOSIS — I429 Cardiomyopathy, unspecified: Secondary | ICD-10-CM | POA: Insufficient documentation

## 2016-01-24 DIAGNOSIS — I5023 Acute on chronic systolic (congestive) heart failure: Secondary | ICD-10-CM | POA: Insufficient documentation

## 2016-01-24 DIAGNOSIS — N189 Chronic kidney disease, unspecified: Secondary | ICD-10-CM | POA: Insufficient documentation

## 2016-01-24 DIAGNOSIS — I130 Hypertensive heart and chronic kidney disease with heart failure and stage 1 through stage 4 chronic kidney disease, or unspecified chronic kidney disease: Secondary | ICD-10-CM | POA: Diagnosis not present

## 2016-01-24 LAB — MYOCARDIAL PERFUSION IMAGING
CHL CUP NUCLEAR SDS: 2
CHL CUP NUCLEAR SRS: 5
CHL CUP NUCLEAR SSS: 7
LHR: 0.32
LV sys vol: 174 mL
LVDIAVOL: 287 mL (ref 62–150)
Peak HR: 82 {beats}/min
Rest HR: 71 {beats}/min
TID: 1.02

## 2016-01-24 MED ORDER — TECHNETIUM TC 99M SESTAMIBI GENERIC - CARDIOLITE
31.7000 | Freq: Once | INTRAVENOUS | Status: AC | PRN
  Administered 2016-01-24: 31.7 via INTRAVENOUS

## 2016-01-24 MED ORDER — TECHNETIUM TC 99M SESTAMIBI GENERIC - CARDIOLITE
10.8000 | Freq: Once | INTRAVENOUS | Status: AC | PRN
  Administered 2016-01-24: 11 via INTRAVENOUS

## 2016-01-24 MED ORDER — REGADENOSON 0.4 MG/5ML IV SOLN
0.4000 mg | Freq: Once | INTRAVENOUS | Status: AC
  Administered 2016-01-24: 0.4 mg via INTRAVENOUS

## 2016-01-25 DIAGNOSIS — N2581 Secondary hyperparathyroidism of renal origin: Secondary | ICD-10-CM | POA: Diagnosis not present

## 2016-01-25 DIAGNOSIS — D631 Anemia in chronic kidney disease: Secondary | ICD-10-CM | POA: Diagnosis not present

## 2016-01-25 DIAGNOSIS — D509 Iron deficiency anemia, unspecified: Secondary | ICD-10-CM | POA: Diagnosis not present

## 2016-01-25 DIAGNOSIS — N186 End stage renal disease: Secondary | ICD-10-CM | POA: Diagnosis not present

## 2016-01-25 DIAGNOSIS — Z23 Encounter for immunization: Secondary | ICD-10-CM | POA: Diagnosis not present

## 2016-01-25 DIAGNOSIS — E1129 Type 2 diabetes mellitus with other diabetic kidney complication: Secondary | ICD-10-CM | POA: Diagnosis not present

## 2016-01-28 DIAGNOSIS — D509 Iron deficiency anemia, unspecified: Secondary | ICD-10-CM | POA: Diagnosis not present

## 2016-01-28 DIAGNOSIS — N2581 Secondary hyperparathyroidism of renal origin: Secondary | ICD-10-CM | POA: Diagnosis not present

## 2016-01-28 DIAGNOSIS — E1129 Type 2 diabetes mellitus with other diabetic kidney complication: Secondary | ICD-10-CM | POA: Diagnosis not present

## 2016-01-28 DIAGNOSIS — D631 Anemia in chronic kidney disease: Secondary | ICD-10-CM | POA: Diagnosis not present

## 2016-01-28 DIAGNOSIS — N186 End stage renal disease: Secondary | ICD-10-CM | POA: Diagnosis not present

## 2016-01-28 DIAGNOSIS — Z23 Encounter for immunization: Secondary | ICD-10-CM | POA: Diagnosis not present

## 2016-01-29 DIAGNOSIS — I5020 Unspecified systolic (congestive) heart failure: Secondary | ICD-10-CM | POA: Diagnosis not present

## 2016-01-29 DIAGNOSIS — S37011D Minor contusion of right kidney, subsequent encounter: Secondary | ICD-10-CM | POA: Diagnosis not present

## 2016-01-29 DIAGNOSIS — I10 Essential (primary) hypertension: Secondary | ICD-10-CM | POA: Diagnosis not present

## 2016-01-29 DIAGNOSIS — N186 End stage renal disease: Secondary | ICD-10-CM | POA: Diagnosis not present

## 2016-01-30 DIAGNOSIS — D509 Iron deficiency anemia, unspecified: Secondary | ICD-10-CM | POA: Diagnosis not present

## 2016-01-30 DIAGNOSIS — N186 End stage renal disease: Secondary | ICD-10-CM | POA: Diagnosis not present

## 2016-01-30 DIAGNOSIS — D631 Anemia in chronic kidney disease: Secondary | ICD-10-CM | POA: Diagnosis not present

## 2016-01-30 DIAGNOSIS — Z23 Encounter for immunization: Secondary | ICD-10-CM | POA: Diagnosis not present

## 2016-01-30 DIAGNOSIS — E1129 Type 2 diabetes mellitus with other diabetic kidney complication: Secondary | ICD-10-CM | POA: Diagnosis not present

## 2016-01-30 DIAGNOSIS — N2581 Secondary hyperparathyroidism of renal origin: Secondary | ICD-10-CM | POA: Diagnosis not present

## 2016-02-01 DIAGNOSIS — N2581 Secondary hyperparathyroidism of renal origin: Secondary | ICD-10-CM | POA: Diagnosis not present

## 2016-02-01 DIAGNOSIS — E1129 Type 2 diabetes mellitus with other diabetic kidney complication: Secondary | ICD-10-CM | POA: Diagnosis not present

## 2016-02-01 DIAGNOSIS — Z23 Encounter for immunization: Secondary | ICD-10-CM | POA: Diagnosis not present

## 2016-02-01 DIAGNOSIS — D631 Anemia in chronic kidney disease: Secondary | ICD-10-CM | POA: Diagnosis not present

## 2016-02-01 DIAGNOSIS — D509 Iron deficiency anemia, unspecified: Secondary | ICD-10-CM | POA: Diagnosis not present

## 2016-02-01 DIAGNOSIS — N186 End stage renal disease: Secondary | ICD-10-CM | POA: Diagnosis not present

## 2016-02-04 DIAGNOSIS — N186 End stage renal disease: Secondary | ICD-10-CM | POA: Diagnosis not present

## 2016-02-04 DIAGNOSIS — D509 Iron deficiency anemia, unspecified: Secondary | ICD-10-CM | POA: Diagnosis not present

## 2016-02-04 DIAGNOSIS — D631 Anemia in chronic kidney disease: Secondary | ICD-10-CM | POA: Diagnosis not present

## 2016-02-04 DIAGNOSIS — Z23 Encounter for immunization: Secondary | ICD-10-CM | POA: Diagnosis not present

## 2016-02-04 DIAGNOSIS — E1129 Type 2 diabetes mellitus with other diabetic kidney complication: Secondary | ICD-10-CM | POA: Diagnosis not present

## 2016-02-04 DIAGNOSIS — N2581 Secondary hyperparathyroidism of renal origin: Secondary | ICD-10-CM | POA: Diagnosis not present

## 2016-02-05 ENCOUNTER — Encounter: Payer: Self-pay | Admitting: Cardiology

## 2016-02-05 ENCOUNTER — Ambulatory Visit (INDEPENDENT_AMBULATORY_CARE_PROVIDER_SITE_OTHER): Payer: Medicare Other | Admitting: Cardiology

## 2016-02-05 VITALS — BP 128/60 | HR 82 | Ht 71.5 in | Wt 224.6 lb

## 2016-02-05 DIAGNOSIS — N186 End stage renal disease: Secondary | ICD-10-CM

## 2016-02-05 DIAGNOSIS — I5022 Chronic systolic (congestive) heart failure: Secondary | ICD-10-CM

## 2016-02-05 DIAGNOSIS — Z86718 Personal history of other venous thrombosis and embolism: Secondary | ICD-10-CM

## 2016-02-05 DIAGNOSIS — I2510 Atherosclerotic heart disease of native coronary artery without angina pectoris: Secondary | ICD-10-CM

## 2016-02-05 DIAGNOSIS — R9439 Abnormal result of other cardiovascular function study: Secondary | ICD-10-CM

## 2016-02-05 DIAGNOSIS — I429 Cardiomyopathy, unspecified: Secondary | ICD-10-CM | POA: Diagnosis not present

## 2016-02-05 DIAGNOSIS — Z992 Dependence on renal dialysis: Secondary | ICD-10-CM

## 2016-02-05 NOTE — Patient Instructions (Signed)
Medication Instructions:  Your physician recommends that you continue on your current medications as directed. Please refer to the Current Medication list given to you today.   Labwork: None ordered  Testing/Procedures: None ordered  Follow-Up: Your physician recommends that you schedule a follow-up appointment in: [place] DR. Angelena Form   Any Other Special Instructions Will Be Listed Below (If Applicable).     If you need a refill on your cardiac medications before your next appointment, please call your pharmacy.

## 2016-02-05 NOTE — Progress Notes (Signed)
Cardiology Office Note   Date:  02/05/2016   ID:  Albert, Stanley 1946/05/15, MRN [id number]  PCP:  [name], MD  Cardiologist:  [name]    Chief Complaint  Patient presents with  . Cardiomyopathy    discuss test results.       History of Present Illness: Albert Stanley is a 70 y.o. male who presents for results of nuc study.   He has a history of CAD, chronic kidney disease on hemodialysis Tuesday, Thursday, Saturday, history of bladder cancer, diabetes and DVT. He was previously on warfarin but this was recently discontinued. In 2002, he had a cath which showed 25% LAD disease but no other CAD. EF at that time was normal.  He was recently admitted 12/04/2015 with right flank pain and was found to have right renal subcapsular hematoma. INR was 2.15. His warfarin was discontinued. He was also noted to have worsening renal failure and was seen by nephrology. Echocardiogram revealed new systolic CHF EF [id number]. There was hypokinesis of the inferolateral inferoseptal myocardium. At the time of his hospitalization, he was not considered a candidate for cardiac catheterization given his chronic kidney disease. This was before he started hemodialysis. However he is now on hemodialysis 3 days a week which is followed by [name].   He reporrts that he has done well. He denies any anginal symptoms. No chest pain or dyspnea. No exertional limitations. His volume is controlled through hemodialysis. We discussed the potential risk of cardiac catheterization. The patient has concerns given these risks. Discussed left heart catheterization versus noninvasive testing with nuclear stress test on last visit.  The patient would preferred to start with noninvasive nuclear stress test first as opposed to left heart catheterization at this time.   Nuc study with EF 39%, there was a large defect of moderate severity present in the mid inf. Mid inferior lateral, apical inferior,  apical lateral and apex location. intermediate risk study.  LV markedly dilated.    Past Medical History  Diagnosis Date  . Hypertension   . Diabetes mellitus without complication ([place])   . Asthma   . Hyperlipidemia   . DVT (deep venous thrombosis) ([place])   . Renal insufficiency   . Bladder cancer ([place]) dx'd 1990    surg only    Past Surgical History  Procedure Laterality Date  . Back surgery      2 times  . Joint replacement      hip  . Arthroscopic knee surgery    . Cystoscopy    . Av fistula placement Left 12/13/2015    Procedure: ARTERIOVENOUS (AV) FISTULA CREATION;  Surgeon: Angelia Mould, MD;  Location: [place];  Service: Vascular;  Laterality: Left;  . Insertion of dialysis catheter N/A 12/13/2015    Procedure: INSERTION OF DIALYSIS CATHETER;  Surgeon: Angelia Mould, MD;  Location: [place];  Service: Vascular;  Laterality: N/A;     Current Outpatient Prescriptions  Medication Sig Dispense Refill  . allopurinol (ZYLOPRIM) 100 MG tablet Take 100 mg by mouth daily.  0  . atorvastatin (LIPITOR) 40 MG tablet Take 40 mg by mouth every evening.  0  . BAYER BREEZE 2 TEST DISK Apply 1 each topically daily as needed (FOR TESTING BLOOD SUGAR).   0  . calcitRIOL (ROCALTROL) 0.5 MCG capsule Take 0.5 mcg by mouth daily.   1  . carvedilol (COREG) 6.25 MG tablet Take 1 tablet (6.25 mg total) by mouth 2 (two) times  daily with a meal. 60 tablet 0  . cyclobenzaprine (FLEXERIL) 5 MG tablet Take 5 mg by mouth 3 (three) times daily as needed for muscle spasms.    . famotidine (PEPCID) 20 MG tablet Take 1 tablet (20 mg total) by mouth 2 (two) times daily as needed for heartburn.    . fluticasone (FLONASE) 50 MCG/ACT nasal spray Place 1 spray into both nostrils daily as needed for allergies or rhinitis.    Marland Kitchen glipiZIDE (GLUCOTROL) 5 MG tablet Take 1 tablet (5 mg total) by mouth daily before breakfast. 30 tablet 0  . multivitamin (RENA-VIT) TABS tablet Take 2 tablets by mouth 3 (three)  times daily.   0  . oxyCODONE-acetaminophen (PERCOCET) 7.5-325 MG tablet Take 1 tablet by mouth every 4 (four) hours as needed for severe pain.    . sevelamer carbonate (RENVELA) 800 MG tablet Take 2 tablets (1,600 mg total) by mouth 3 (three) times daily with meals. 90 tablet 0   No current facility-administered medications for this visit.    Allergies:   Eggs or egg-derived products; Foltx; Meperidine; Meperidine hcl; Penicillin g; Penicillins; Povidone iodine; Sulfa antibiotics; Sulfonamide derivatives; and Povidone-iodine    Social History:  The patient  reports that he has quit smoking. He has never used smokeless tobacco. He reports that he does not drink alcohol or use illicit drugs.   Family History:  The patient's family history includes Alcohol abuse in his brother; Arthritis in his mother; Cancer in his father; Diabetes in his daughter; Heart attack in his paternal uncle; Heart disease in his father; Hypertension in his sister.    ROS:  General:no colds or fevers, no weight changes Skin:no rashes or ulcers HEENT:no blurred vision, no congestion CV:see HPI PUL:see HPI GI:no diarrhea constipation or melena, no indigestion GU:no hematuria, no dysuria MS:no joint pain, no claudication Neuro:no syncope, no lightheadedness Endo:+ diabetes, no thyroid disease  Wt Readings from Last 3 Encounters:  02/05/16 224 lb 9.6 oz (101.878 kg)  01/22/16 226 lb 9.6 oz (102.785 kg)  01/17/16 227 lb 3.2 oz (103.057 kg)     PHYSICAL EXAM: VS:  BP 128/60 mmHg  Pulse 82  Ht 5' 11.5" (1.816 m)  Wt 224 lb 9.6 oz (101.878 kg)  BMI 30.89 kg/m2 , BMI Body mass index is 30.89 kg/(m^2). General:Pleasant affect, NAD Skin:Warm and dry, brisk capillary refill Heart:S1S2 RRR without murmur, gallup, rub or click Lungs:clear without rales, rhonchi, or wheezes Ext:no lower ext edema, 2+ pedal pulses, 2+ radial pulses Neuro:alert and oriented, MAE, follows commands, + facial symmetry    EKG:  EKG  is NOT ordered today.    Recent Labs: 02/06/2015: B Natriuretic Peptide 53.4 12/12/2015: Magnesium 2.3 12/16/2015: ALT 15* 12/18/2015: BUN 47*; Creatinine, Ser 5.04*; Hemoglobin 8.3*; Platelets 262; Potassium 4.5; Sodium 132*    Lipid Panel No results found for: CHOL, TRIG, HDL, CHOLHDL, VLDL, LDLCALC, LDLDIRECT     Other studies Reviewed: Additional studies/ records that were reviewed today include: previous notes.   Nuclear stress EF: 39%.  There was no ST segment deviation noted during stress.  Defect 1: There is a large defect of moderate severity present in the mid inferior, mid inferolateral, apical inferior, apical lateral and apex location.  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  This is an intermediate risk study.  The left ventricular ejection fraction is moderately decreased (30-44%).  The LV is markedly dilated.      Cardiac Cath: 2002    HEMODYNAMICS: LV 111/13, AO 111/68.  CORONARY ARTERIOGRAPHY: Left main: The left main coronary artery was normal.  Left anterior descending: The LAD had a mid 25% stenosis.  Circumflex: The circumflex was normal.  Right coronary artery: The right coronary artery was dominant and normal.  LEFT VENTRICULOGRAM: The left ventriculogram was obtained in the RAO projection. The EF was 55% with normal wall motion.  CONCLUSIONS: Minimal coronary artery plaquing. No significant LV Dysfunction  ECHO 2/223/17 Study Conclusions  - Left ventricle: The cavity size was mildly dilated. There was  moderate concentric hypertrophy. Systolic function was moderately  reduced. The estimated ejection fraction was in the range of 35%  to 40%. Hypokinesis of the inferolateral and inferoseptal  myocardium. - Aortic valve: There was trivial regurgitation. - Mitral valve: There was moderate regurgitation. - Left atrium: The atrium was severely dilated. - Pericardium, extracardiac: A  small pericardial effusion was  identified.  ASSESSMENT AND PLAN:  1.  Abnormal nuc study possible MI to explain the drop in EF.  Dr.McAlhany's plan had been to proceed with cath once pt was on dialysis.  Pt preferred to have nuc study first.  With results as discussed.  Pt is still anxious about cath.  Additionally with hx renal hematoma the timing of the cath;   pt to have CT of abd to eval hematoma per Dr. Risa Grill.  Will ask Dr. Angelena Form to give recommendations.  -pt currently asymptomatic.  Dialysis is controlling fluid.  2.  Cardiomyopathy possible ischemic. Again recommend cardiac cath.    3. Hx of DVT off coumadin, PCP to decide when to resume.   4. Renal hematoma, right.    5. hyperlipidemia     Continue statin.   Current medicines are reviewed with the patient today.  The patient Has no concerns regarding medicines.  The following changes have been made:  See above Labs/ tests ordered today include:see above  Disposition:   FU:  see above  Signed, Isaiah Serge, NP  02/05/2016 10:10 PM    [facility] [place], [place], [address] [place], [place] Phone: [phone]; Fax: [phone]

## 2016-02-06 DIAGNOSIS — N186 End stage renal disease: Secondary | ICD-10-CM | POA: Diagnosis not present

## 2016-02-06 DIAGNOSIS — D509 Iron deficiency anemia, unspecified: Secondary | ICD-10-CM | POA: Diagnosis not present

## 2016-02-06 DIAGNOSIS — Z23 Encounter for immunization: Secondary | ICD-10-CM | POA: Diagnosis not present

## 2016-02-06 DIAGNOSIS — E1129 Type 2 diabetes mellitus with other diabetic kidney complication: Secondary | ICD-10-CM | POA: Diagnosis not present

## 2016-02-06 DIAGNOSIS — D631 Anemia in chronic kidney disease: Secondary | ICD-10-CM | POA: Diagnosis not present

## 2016-02-06 DIAGNOSIS — N2581 Secondary hyperparathyroidism of renal origin: Secondary | ICD-10-CM | POA: Diagnosis not present

## 2016-02-08 DIAGNOSIS — N2581 Secondary hyperparathyroidism of renal origin: Secondary | ICD-10-CM | POA: Diagnosis not present

## 2016-02-08 DIAGNOSIS — D509 Iron deficiency anemia, unspecified: Secondary | ICD-10-CM | POA: Diagnosis not present

## 2016-02-08 DIAGNOSIS — E1129 Type 2 diabetes mellitus with other diabetic kidney complication: Secondary | ICD-10-CM | POA: Diagnosis not present

## 2016-02-08 DIAGNOSIS — N186 End stage renal disease: Secondary | ICD-10-CM | POA: Diagnosis not present

## 2016-02-08 DIAGNOSIS — D631 Anemia in chronic kidney disease: Secondary | ICD-10-CM | POA: Diagnosis not present

## 2016-02-08 DIAGNOSIS — Z23 Encounter for immunization: Secondary | ICD-10-CM | POA: Diagnosis not present

## 2016-02-09 DIAGNOSIS — N186 End stage renal disease: Secondary | ICD-10-CM | POA: Diagnosis not present

## 2016-02-09 DIAGNOSIS — Z992 Dependence on renal dialysis: Secondary | ICD-10-CM | POA: Diagnosis not present

## 2016-02-09 DIAGNOSIS — E1122 Type 2 diabetes mellitus with diabetic chronic kidney disease: Secondary | ICD-10-CM | POA: Diagnosis not present

## 2016-02-11 DIAGNOSIS — D631 Anemia in chronic kidney disease: Secondary | ICD-10-CM | POA: Diagnosis not present

## 2016-02-11 DIAGNOSIS — N2581 Secondary hyperparathyroidism of renal origin: Secondary | ICD-10-CM | POA: Diagnosis not present

## 2016-02-11 DIAGNOSIS — E1129 Type 2 diabetes mellitus with other diabetic kidney complication: Secondary | ICD-10-CM | POA: Diagnosis not present

## 2016-02-11 DIAGNOSIS — Z23 Encounter for immunization: Secondary | ICD-10-CM | POA: Diagnosis not present

## 2016-02-11 DIAGNOSIS — N186 End stage renal disease: Secondary | ICD-10-CM | POA: Diagnosis not present

## 2016-02-11 DIAGNOSIS — D509 Iron deficiency anemia, unspecified: Secondary | ICD-10-CM | POA: Diagnosis not present

## 2016-02-13 DIAGNOSIS — E1129 Type 2 diabetes mellitus with other diabetic kidney complication: Secondary | ICD-10-CM | POA: Diagnosis not present

## 2016-02-13 DIAGNOSIS — N2581 Secondary hyperparathyroidism of renal origin: Secondary | ICD-10-CM | POA: Diagnosis not present

## 2016-02-13 DIAGNOSIS — D509 Iron deficiency anemia, unspecified: Secondary | ICD-10-CM | POA: Diagnosis not present

## 2016-02-13 DIAGNOSIS — D631 Anemia in chronic kidney disease: Secondary | ICD-10-CM | POA: Diagnosis not present

## 2016-02-13 DIAGNOSIS — N186 End stage renal disease: Secondary | ICD-10-CM | POA: Diagnosis not present

## 2016-02-13 DIAGNOSIS — Z23 Encounter for immunization: Secondary | ICD-10-CM | POA: Diagnosis not present

## 2016-02-14 ENCOUNTER — Ambulatory Visit (HOSPITAL_COMMUNITY)
Admission: RE | Admit: 2016-02-14 | Discharge: 2016-02-14 | Disposition: A | Discharge status: Home / Self Care | Payer: Medicare Other | Source: Ambulatory Visit | Attending: Cardiology | Admitting: Cardiology

## 2016-02-14 VITALS — BP 140/72 | HR 87 | Wt 221.0 lb

## 2016-02-14 DIAGNOSIS — M109 Gout, unspecified: Secondary | ICD-10-CM | POA: Diagnosis not present

## 2016-02-14 DIAGNOSIS — I2510 Atherosclerotic heart disease of native coronary artery without angina pectoris: Secondary | ICD-10-CM | POA: Diagnosis not present

## 2016-02-14 DIAGNOSIS — E785 Hyperlipidemia, unspecified: Secondary | ICD-10-CM | POA: Diagnosis not present

## 2016-02-14 DIAGNOSIS — Z86718 Personal history of other venous thrombosis and embolism: Secondary | ICD-10-CM | POA: Insufficient documentation

## 2016-02-14 DIAGNOSIS — N186 End stage renal disease: Secondary | ICD-10-CM | POA: Diagnosis not present

## 2016-02-14 DIAGNOSIS — Z87891 Personal history of nicotine dependence: Secondary | ICD-10-CM | POA: Insufficient documentation

## 2016-02-14 DIAGNOSIS — I130 Hypertensive heart and chronic kidney disease with heart failure and stage 1 through stage 4 chronic kidney disease, or unspecified chronic kidney disease: Secondary | ICD-10-CM | POA: Diagnosis not present

## 2016-02-14 DIAGNOSIS — Z79899 Other long term (current) drug therapy: Secondary | ICD-10-CM | POA: Insufficient documentation

## 2016-02-14 DIAGNOSIS — E669 Obesity, unspecified: Secondary | ICD-10-CM | POA: Insufficient documentation

## 2016-02-14 DIAGNOSIS — Z7984 Long term (current) use of oral hypoglycemic drugs: Secondary | ICD-10-CM | POA: Insufficient documentation

## 2016-02-14 DIAGNOSIS — E1122 Type 2 diabetes mellitus with diabetic chronic kidney disease: Secondary | ICD-10-CM | POA: Insufficient documentation

## 2016-02-14 DIAGNOSIS — Z992 Dependence on renal dialysis: Secondary | ICD-10-CM | POA: Insufficient documentation

## 2016-02-14 DIAGNOSIS — I429 Cardiomyopathy, unspecified: Secondary | ICD-10-CM | POA: Insufficient documentation

## 2016-02-14 DIAGNOSIS — Z96642 Presence of left artificial hip joint: Secondary | ICD-10-CM | POA: Diagnosis not present

## 2016-02-14 DIAGNOSIS — D631 Anemia in chronic kidney disease: Secondary | ICD-10-CM | POA: Diagnosis not present

## 2016-02-14 DIAGNOSIS — M545 Low back pain: Secondary | ICD-10-CM | POA: Insufficient documentation

## 2016-02-14 DIAGNOSIS — I5021 Acute systolic (congestive) heart failure: Secondary | ICD-10-CM | POA: Insufficient documentation

## 2016-02-14 LAB — LIPID PANEL
CHOLESTEROL: 91 mg/dL (ref 0–200)
HDL: 37 mg/dL — AB (ref >40)
LDL CALC: 45 mg/dL (ref 0–99)
TRIGLYCERIDES: 46 mg/dL (ref <150)
Total CHOL/HDL Ratio: 2.5 RATIO
VLDL: 9 mg/dL (ref 0–40)

## 2016-02-14 MED ORDER — CARVEDILOL 3.125 MG PO TABS: 3.1250 mg | ORAL_TABLET | Freq: Two times a day (BID) | ORAL | Status: DC

## 2016-02-14 MED ORDER — ASPIRIN EC 81 MG PO TBEC: 81.0000 mg | DELAYED_RELEASE_TABLET | Freq: Every day | ORAL | Status: DC

## 2016-02-14 NOTE — Patient Instructions (Signed)
Start Carvedilol 3.125 mg Twice daily   Start Aspirin 81 mg daily  Labs today  Your physician has requested that you have a cardiac catheterization. Cardiac catheterization is used to diagnose and/or treat various heart conditions. Doctors may recommend this procedure for a number of different reasons. The most common reason is to evaluate chest pain. Chest pain can be a symptom of coronary artery disease (CAD), and cardiac catheterization can show whether plaque is narrowing or blocking your heart's arteries. This procedure is also used to evaluate the valves, as well as measure the blood flow and oxygen levels in different parts of your heart. For further information please visit [email or web address]. Please follow instruction sheet, as given.  Your physician recommends that you schedule a follow-up appointment in: 4 weeks

## 2016-02-15 DIAGNOSIS — D509 Iron deficiency anemia, unspecified: Secondary | ICD-10-CM | POA: Diagnosis not present

## 2016-02-15 DIAGNOSIS — Z23 Encounter for immunization: Secondary | ICD-10-CM | POA: Diagnosis not present

## 2016-02-15 DIAGNOSIS — D631 Anemia in chronic kidney disease: Secondary | ICD-10-CM | POA: Diagnosis not present

## 2016-02-15 DIAGNOSIS — N186 End stage renal disease: Secondary | ICD-10-CM | POA: Diagnosis not present

## 2016-02-15 DIAGNOSIS — N2581 Secondary hyperparathyroidism of renal origin: Secondary | ICD-10-CM | POA: Diagnosis not present

## 2016-02-15 DIAGNOSIS — E1129 Type 2 diabetes mellitus with other diabetic kidney complication: Secondary | ICD-10-CM | POA: Diagnosis not present

## 2016-02-15 NOTE — Progress Notes (Signed)
PCP: Dr. Delfina Redwood Cardiology: [name]  70 yo with history of HTN, DM, and ESRD presents for cardiology followup.  In 2/17, he was admitted with a right renal subcapsular hematoma.  He had been on warfarin for history of DVT, this was stopped.  Echo during that admission showed EF 35-40% with wall motion abnormalities concerning for CAD.  Cardiac cath was not done as creatinine was high and he was not yet on dialysis. Since that time, he has started HD.  He had a Cardiolite in 4/17 showing a large fixed inferior defect with EF 39%, intermediate risk study.   Patient has had no chest pain.  He has generalized weakness and fatigue.  Not very active, but able to walk around the house without problems and can get around the grocery store.  No palpitations, lightheadedness or syncope. No claudication.  Hemoglobin has been low chronically.   ECG (2/17): NSR, PVCs, nonspecific TW flattening  Labs (3/17): HCT 24.3  Past Medical History: 1. CAD: [place] 2002 showed mild nonobstructive disease with 25% stenosis in LAD.  - Echo (2/17) with EF 35-40%, inferolateral and inferoseptal hypokinesis.  - Cardiolite (4/17) with EF 39%, large inferior/inferolateral primarily fixed defect, intermediate risk study.  2. DM2 3. Obesity 4. ESRD: TTS HD.  5. HTN 6. Gout 7. Low back pain 8. 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 Anemia of renal disease.  10. H/o DVT 11. Right renal subcapsular hematoma in 2/17.  12. Hyperlipidemia.   Family History: Father died from "heart complications" at 49  Social History: Nonsmoker (quit 1998). 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   ROS: All systems reviewed and negative except as per HPI.   Current Outpatient Prescriptions  Medication Sig Dispense Refill  . allopurinol (ZYLOPRIM) 300 MG tablet Take 300 mg by mouth daily.    Albert Stanley atorvastatin (LIPITOR) 40 MG tablet Take 40 mg by mouth every evening.  0  . BAYER BREEZE  2 TEST DISK Apply 1 each topically daily as needed (FOR TESTING BLOOD SUGAR).   0  . calcitRIOL (ROCALTROL) 0.5 MCG capsule Take 0.5 mcg by mouth daily.   1  . carvedilol (COREG) 3.125 MG tablet Take 1 tablet (3.125 mg total) by mouth 2 (two) times daily with a meal. 60 tablet 3  . cyclobenzaprine (FLEXERIL) 5 MG tablet Take 5 mg by mouth 3 (three) times daily as needed for muscle spasms.    . famotidine (PEPCID) 20 MG tablet Take 1 tablet (20 mg total) by mouth 2 (two) times daily as needed for heartburn.    . fluticasone (FLONASE) 50 MCG/ACT nasal spray Place 1 spray into both nostrils daily as needed for allergies or rhinitis.    Albert Stanley glipiZIDE (GLUCOTROL) 5 MG tablet Take 1 tablet (5 mg total) by mouth daily before breakfast. 30 tablet 0  . multivitamin (RENA-VIT) TABS tablet Take 2 tablets by mouth 3 (three) times daily.   0  . oxyCODONE-acetaminophen (PERCOCET) 7.5-325 MG tablet Take 1 tablet by mouth every 4 (four) hours as needed for severe pain.    . sevelamer carbonate (RENVELA) 800 MG tablet Take 2 tablets (1,600 mg total) by mouth 3 (three) times daily with meals. 90 tablet 0  . aspirin EC 81 MG tablet Take 1 tablet (81 mg total) by mouth daily. 90 tablet 3   No current facility-administered medications for this encounter.   BP 140/72 mmHg  Pulse 87  Wt 221 lb (100.245 kg)  SpO2 98% General: NAD Neck: No JVD, no thyromegaly or thyroid nodule.  Lungs: Clear to auscultation bilaterally with normal respiratory effort. CV: Nondisplaced PMI.  Heart regular S1/S2, no S3/S4, no murmur.  Trace ankle edema.  No carotid bruit.  Normal pedal pulses.  Abdomen: Soft, nontender, no hepatosplenomegaly, no distention.  Skin: Intact without lesions or rashes.  Neurologic: Alert and oriented x 3.  Psych: Normal affect. Extremities: No clubbing or cyanosis.  HEENT: Normal.   Assessment/Plan: 1. CAD: Strong suspicion for CAD with abnormal echo (low EF and wall motion abnormalities) and Cardiolite  showing low EF and fixed inferior defect.  Multiple RFs: DM, HTN, age/gender, hyperlipidemia.  Patient has fatigue and weakness but has not had chest pain.  - Start ASA 81 daily.  - Continue atorvastatin.  - I think it would be reasonable to do coronary angiography to diagnose CAD and assess degree of disease.  We discussed the procedure and risks/benefits.  He agrees to proceed.  2. 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  EF 35-40% by echo and 39% by Cardiolite. He has ESRD and volume seems well-controlled.  - Add Coreg 3.125 mg bid.  BP should be ok with this but asked him to clear with renal first (will mention my recommendation to start Coreg and ASA 81 at HD tomorrow).   3. Hyperlipidemia: Check lipids today. Goal LDL < 70 with CAD.  He is on atorvastatin.   Loralie Champagne 02/15/2016

## 2016-02-17 ENCOUNTER — Telehealth (HOSPITAL_COMMUNITY): Payer: Self-pay | Admitting: Vascular Surgery

## 2016-02-17 DIAGNOSIS — M5416 Radiculopathy, lumbar region: Secondary | ICD-10-CM | POA: Diagnosis not present

## 2016-02-17 DIAGNOSIS — M79605 Pain in left leg: Secondary | ICD-10-CM | POA: Diagnosis not present

## 2016-02-17 DIAGNOSIS — M25571 Pain in right ankle and joints of right foot: Secondary | ICD-10-CM | POA: Diagnosis not present

## 2016-02-17 DIAGNOSIS — Z79891 Long term (current) use of opiate analgesic: Secondary | ICD-10-CM | POA: Diagnosis not present

## 2016-02-17 DIAGNOSIS — M25551 Pain in right hip: Secondary | ICD-10-CM | POA: Diagnosis not present

## 2016-02-17 DIAGNOSIS — M25552 Pain in left hip: Secondary | ICD-10-CM | POA: Diagnosis not present

## 2016-02-17 DIAGNOSIS — M79652 Pain in left thigh: Secondary | ICD-10-CM | POA: Diagnosis not present

## 2016-02-17 DIAGNOSIS — G894 Chronic pain syndrome: Secondary | ICD-10-CM | POA: Diagnosis not present

## 2016-02-17 DIAGNOSIS — M79604 Pain in right leg: Secondary | ICD-10-CM | POA: Diagnosis not present

## 2016-02-17 NOTE — Telephone Encounter (Signed)
Left pt message to make 4 week app w/ [facility]

## 2016-02-18 DIAGNOSIS — E1129 Type 2 diabetes mellitus with other diabetic kidney complication: Secondary | ICD-10-CM | POA: Diagnosis not present

## 2016-02-18 DIAGNOSIS — N186 End stage renal disease: Secondary | ICD-10-CM | POA: Diagnosis not present

## 2016-02-18 DIAGNOSIS — D509 Iron deficiency anemia, unspecified: Secondary | ICD-10-CM | POA: Diagnosis not present

## 2016-02-18 DIAGNOSIS — Z23 Encounter for immunization: Secondary | ICD-10-CM | POA: Diagnosis not present

## 2016-02-18 DIAGNOSIS — N2581 Secondary hyperparathyroidism of renal origin: Secondary | ICD-10-CM | POA: Diagnosis not present

## 2016-02-18 DIAGNOSIS — D631 Anemia in chronic kidney disease: Secondary | ICD-10-CM | POA: Diagnosis not present

## 2016-02-20 ENCOUNTER — Encounter (HOSPITAL_COMMUNITY): Payer: Self-pay | Admitting: Cardiology

## 2016-02-20 DIAGNOSIS — Z23 Encounter for immunization: Secondary | ICD-10-CM | POA: Diagnosis not present

## 2016-02-20 DIAGNOSIS — E1129 Type 2 diabetes mellitus with other diabetic kidney complication: Secondary | ICD-10-CM | POA: Diagnosis not present

## 2016-02-20 DIAGNOSIS — N186 End stage renal disease: Secondary | ICD-10-CM | POA: Diagnosis not present

## 2016-02-20 DIAGNOSIS — D509 Iron deficiency anemia, unspecified: Secondary | ICD-10-CM | POA: Diagnosis not present

## 2016-02-20 DIAGNOSIS — D631 Anemia in chronic kidney disease: Secondary | ICD-10-CM | POA: Diagnosis not present

## 2016-02-20 DIAGNOSIS — N2581 Secondary hyperparathyroidism of renal origin: Secondary | ICD-10-CM | POA: Diagnosis not present

## 2016-02-22 DIAGNOSIS — D631 Anemia in chronic kidney disease: Secondary | ICD-10-CM | POA: Diagnosis not present

## 2016-02-22 DIAGNOSIS — N2581 Secondary hyperparathyroidism of renal origin: Secondary | ICD-10-CM | POA: Diagnosis not present

## 2016-02-22 DIAGNOSIS — E1129 Type 2 diabetes mellitus with other diabetic kidney complication: Secondary | ICD-10-CM | POA: Diagnosis not present

## 2016-02-22 DIAGNOSIS — N186 End stage renal disease: Secondary | ICD-10-CM | POA: Diagnosis not present

## 2016-02-22 DIAGNOSIS — D509 Iron deficiency anemia, unspecified: Secondary | ICD-10-CM | POA: Diagnosis not present

## 2016-02-22 DIAGNOSIS — Z23 Encounter for immunization: Secondary | ICD-10-CM | POA: Diagnosis not present

## 2016-02-25 DIAGNOSIS — E1129 Type 2 diabetes mellitus with other diabetic kidney complication: Secondary | ICD-10-CM | POA: Diagnosis not present

## 2016-02-25 DIAGNOSIS — N186 End stage renal disease: Secondary | ICD-10-CM | POA: Diagnosis not present

## 2016-02-25 DIAGNOSIS — Z23 Encounter for immunization: Secondary | ICD-10-CM | POA: Diagnosis not present

## 2016-02-25 DIAGNOSIS — D509 Iron deficiency anemia, unspecified: Secondary | ICD-10-CM | POA: Diagnosis not present

## 2016-02-25 DIAGNOSIS — N2581 Secondary hyperparathyroidism of renal origin: Secondary | ICD-10-CM | POA: Diagnosis not present

## 2016-02-25 DIAGNOSIS — R300 Dysuria: Secondary | ICD-10-CM | POA: Diagnosis not present

## 2016-02-25 DIAGNOSIS — D631 Anemia in chronic kidney disease: Secondary | ICD-10-CM | POA: Diagnosis not present

## 2016-02-27 DIAGNOSIS — N2581 Secondary hyperparathyroidism of renal origin: Secondary | ICD-10-CM | POA: Diagnosis not present

## 2016-02-27 DIAGNOSIS — N186 End stage renal disease: Secondary | ICD-10-CM | POA: Diagnosis not present

## 2016-02-27 DIAGNOSIS — Z23 Encounter for immunization: Secondary | ICD-10-CM | POA: Diagnosis not present

## 2016-02-27 DIAGNOSIS — D631 Anemia in chronic kidney disease: Secondary | ICD-10-CM | POA: Diagnosis not present

## 2016-02-27 DIAGNOSIS — D509 Iron deficiency anemia, unspecified: Secondary | ICD-10-CM | POA: Diagnosis not present

## 2016-02-27 DIAGNOSIS — E1129 Type 2 diabetes mellitus with other diabetic kidney complication: Secondary | ICD-10-CM | POA: Diagnosis not present

## 2016-02-28 ENCOUNTER — Ambulatory Visit (HOSPITAL_COMMUNITY)
Admission: RE | Admit: 2016-02-28 | Discharge: 2016-02-28 | Disposition: A | Discharge status: Home / Self Care | Payer: Medicare Other | Source: Ambulatory Visit | Attending: Cardiology | Admitting: Cardiology

## 2016-02-28 ENCOUNTER — Encounter (HOSPITAL_COMMUNITY)
Admission: RE | Disposition: A | Discharge status: Home / Self Care | Payer: Self-pay | Source: Ambulatory Visit | Attending: Cardiology

## 2016-02-28 ENCOUNTER — Encounter (HOSPITAL_COMMUNITY): Payer: Self-pay | Admitting: *Deleted

## 2016-02-28 ENCOUNTER — Other Ambulatory Visit (HOSPITAL_COMMUNITY): Payer: Self-pay | Admitting: *Deleted

## 2016-02-28 DIAGNOSIS — I429 Cardiomyopathy, unspecified: Secondary | ICD-10-CM | POA: Insufficient documentation

## 2016-02-28 DIAGNOSIS — Z86718 Personal history of other venous thrombosis and embolism: Secondary | ICD-10-CM | POA: Insufficient documentation

## 2016-02-28 DIAGNOSIS — Z7982 Long term (current) use of aspirin: Secondary | ICD-10-CM | POA: Diagnosis not present

## 2016-02-28 DIAGNOSIS — N186 End stage renal disease: Secondary | ICD-10-CM | POA: Insufficient documentation

## 2016-02-28 DIAGNOSIS — I120 Hypertensive chronic kidney disease with stage 5 chronic kidney disease or end stage renal disease: Secondary | ICD-10-CM | POA: Insufficient documentation

## 2016-02-28 DIAGNOSIS — E785 Hyperlipidemia, unspecified: Secondary | ICD-10-CM | POA: Diagnosis not present

## 2016-02-28 DIAGNOSIS — I2510 Atherosclerotic heart disease of native coronary artery without angina pectoris: Secondary | ICD-10-CM | POA: Insufficient documentation

## 2016-02-28 DIAGNOSIS — Z96642 Presence of left artificial hip joint: Secondary | ICD-10-CM | POA: Insufficient documentation

## 2016-02-28 DIAGNOSIS — E669 Obesity, unspecified: Secondary | ICD-10-CM | POA: Insufficient documentation

## 2016-02-28 DIAGNOSIS — E119 Type 2 diabetes mellitus without complications: Secondary | ICD-10-CM | POA: Insufficient documentation

## 2016-02-28 DIAGNOSIS — R531 Weakness: Secondary | ICD-10-CM | POA: Insufficient documentation

## 2016-02-28 HISTORY — PX: CARDIAC CATHETERIZATION: SHX172

## 2016-02-28 LAB — GLUCOSE, CAPILLARY
GLUCOSE-CAPILLARY: 53 mg/dL — AB (ref 65–99)
GLUCOSE-CAPILLARY: 93 mg/dL (ref 65–99)
Glucose-Capillary: 107 mg/dL — ABNORMAL HIGH (ref 65–99)

## 2016-02-28 LAB — BASIC METABOLIC PANEL
Anion gap: 11 (ref 5–15)
BUN: 28 mg/dL — AB (ref 6–20)
CHLORIDE: 101 mmol/L (ref 101–111)
CO2: 29 mmol/L (ref 22–32)
CREATININE: 4.34 mg/dL — AB (ref 0.61–1.24)
Calcium: 9 mg/dL (ref 8.9–10.3)
GFR calc Af Amer: 15 mL/min — ABNORMAL LOW (ref >60)
GFR calc non Af Amer: 13 mL/min — ABNORMAL LOW (ref >60)
GLUCOSE: 110 mg/dL — AB (ref 65–99)
Potassium: 3.7 mmol/L (ref 3.5–5.1)
SODIUM: 141 mmol/L (ref 135–145)

## 2016-02-28 LAB — CBC
HCT: 35.5 % — ABNORMAL LOW (ref 39.0–52.0)
Hemoglobin: 11.1 g/dL — ABNORMAL LOW (ref 13.0–17.0)
MCH: 27.3 pg (ref 26.0–34.0)
MCHC: 31.3 g/dL (ref 30.0–36.0)
MCV: 87.4 fL (ref 78.0–100.0)
PLATELETS: 137 10*3/uL — AB (ref 150–400)
RBC: 4.06 MIL/uL — ABNORMAL LOW (ref 4.22–5.81)
RDW: 15.7 % — AB (ref 11.5–15.5)
WBC: 5.2 10*3/uL (ref 4.0–10.5)

## 2016-02-28 LAB — PROTIME-INR
INR: 1.27 (ref 0.00–1.49)
Prothrombin Time: 16 seconds — ABNORMAL HIGH (ref 11.6–15.2)

## 2016-02-28 SURGERY — LEFT HEART CATH AND CORONARY ANGIOGRAPHY

## 2016-02-28 MED ORDER — SODIUM CHLORIDE 0.9 % IV SOLN: 250.0000 mL | INTRAVENOUS | Status: DC | PRN

## 2016-02-28 MED ORDER — SODIUM CHLORIDE 0.9% FLUSH: 3.0000 mL | Freq: Two times a day (BID) | INTRAVENOUS | Status: DC

## 2016-02-28 MED ORDER — SODIUM CHLORIDE 0.9% FLUSH: 3.0000 mL | INTRAVENOUS | Status: DC | PRN

## 2016-02-28 MED ORDER — HEPARIN (PORCINE) IN NACL 2-0.9 UNIT/ML-% IJ SOLN
INTRAMUSCULAR | Status: DC | PRN
  Administered 2016-02-28: 1500 mL

## 2016-02-28 MED ORDER — SODIUM CHLORIDE 0.9 % IV SOLN
INTRAVENOUS | Status: DC
  Administered 2016-02-28: 09:00:00 via INTRAVENOUS

## 2016-02-28 MED ORDER — LIDOCAINE HCL (PF) 1 % IJ SOLN
INTRAMUSCULAR | Status: DC | PRN
  Administered 2016-02-28: 20 mL

## 2016-02-28 MED ORDER — ACETAMINOPHEN 325 MG PO TABS: 650.0000 mg | ORAL_TABLET | ORAL | Status: DC | PRN

## 2016-02-28 MED ORDER — HEPARIN (PORCINE) IN NACL 2-0.9 UNIT/ML-% IJ SOLN
INTRAMUSCULAR | Status: AC
  Filled 2016-02-28: qty 1000

## 2016-02-28 MED ORDER — FENTANYL CITRATE (PF) 100 MCG/2ML IJ SOLN
INTRAMUSCULAR | Status: AC
  Filled 2016-02-28: qty 2

## 2016-02-28 MED ORDER — MIDAZOLAM HCL 2 MG/2ML IJ SOLN
INTRAMUSCULAR | Status: DC | PRN
  Administered 2016-02-28: 1 mg via INTRAVENOUS

## 2016-02-28 MED ORDER — ASPIRIN 81 MG PO CHEW: 81.0000 mg | CHEWABLE_TABLET | ORAL | Status: DC

## 2016-02-28 MED ORDER — IOPAMIDOL (ISOVUE-370) INJECTION 76%
INTRAVENOUS | Status: DC | PRN
  Administered 2016-02-28: 80 mL via INTRA_ARTERIAL

## 2016-02-28 MED ORDER — LIDOCAINE HCL (PF) 1 % IJ SOLN
INTRAMUSCULAR | Status: AC
  Filled 2016-02-28: qty 30

## 2016-02-28 MED ORDER — FENTANYL CITRATE (PF) 100 MCG/2ML IJ SOLN
INTRAMUSCULAR | Status: DC | PRN
  Administered 2016-02-28: 25 ug via INTRAVENOUS

## 2016-02-28 MED ORDER — MIDAZOLAM HCL 2 MG/2ML IJ SOLN
INTRAMUSCULAR | Status: AC
  Filled 2016-02-28: qty 2

## 2016-02-28 MED ORDER — ONDANSETRON HCL 4 MG/2ML IJ SOLN: 4.0000 mg | Freq: Four times a day (QID) | INTRAMUSCULAR | Status: DC | PRN

## 2016-02-28 MED ORDER — IOPAMIDOL (ISOVUE-370) INJECTION 76%
INTRAVENOUS | Status: AC
  Filled 2016-02-28: qty 100

## 2016-02-28 SURGICAL SUPPLY — 7 items
CATH INFINITI 5FR MULTPACK ANG (CATHETERS) ×2 IMPLANT
KIT HEART LEFT (KITS) ×3 IMPLANT
PACK CARDIAC CATHETERIZATION (CUSTOM PROCEDURE TRAY) ×3 IMPLANT
SHEATH PINNACLE 5F 10CM (SHEATH) ×2 IMPLANT
SYR MEDRAD MARK V 150ML (SYRINGE) ×3 IMPLANT
TRANSDUCER W/STOPCOCK (MISCELLANEOUS) ×6 IMPLANT
WIRE EMERALD 3MM-J .035X150CM (WIRE) ×2 IMPLANT

## 2016-02-28 NOTE — Discharge Instructions (Signed)
Angiogram, Care After °Refer to this sheet in the next few weeks. These instructions provide you with information about caring for yourself after your procedure. Your health care provider may also give you more specific instructions. Your treatment has been planned according to current medical practices, but problems sometimes occur. Call your health care provider if you have any problems or questions after your procedure. °WHAT TO EXPECT AFTER THE PROCEDURE °After your procedure, it is typical to have the following: °· Bruising at the catheter insertion site that usually fades within 1-2 weeks. °· Blood collecting in the tissue (hematoma) that may be painful to the touch. It should usually decrease in size and tenderness within 1-2 weeks. °HOME CARE INSTRUCTIONS °· Take medicines only as directed by your health care provider. °· You may shower 24-48 hours after the procedure or as directed by your health care provider. Remove the bandage (dressing) and gently wash the site with plain soap and water. Pat the area dry with a clean towel. Do not rub the site, because this may cause bleeding. °· Do not take baths, swim, or use a hot tub until your health care provider approves. °· Check your insertion site every day for redness, swelling, or drainage. °· Do not apply powder or lotion to the site. °· Do not lift over 10 lb (4.5 kg) for 5 days after your procedure or as directed by your health care provider. °· Ask your health care provider when it is okay to: °¨ Return to work or school. °¨ Resume usual physical activities or sports. °¨ Resume sexual activity. °· Do not drive home if you are discharged the same day as the procedure. Have someone else drive you. °· You may drive 24 hours after the procedure unless otherwise instructed by your health care provider. °· Do not operate machinery or power tools for 24 hours after the procedure or as directed by your health care provider. °· If your procedure was done as an  outpatient procedure, which means that you went home the same day as your procedure, a responsible adult should be with you for the first 24 hours after you arrive home. °· Keep all follow-up visits as directed by your health care provider. This is important. °SEEK MEDICAL CARE IF: °· You have a fever. °· You have chills. °· You have increased bleeding from the catheter insertion site. Hold pressure on the site and call 911. °SEEK IMMEDIATE MEDICAL CARE IF: °· You have unusual pain at the catheter insertion site. °· You have redness, warmth, or swelling at the catheter insertion site. °· You have drainage (other than a small amount of blood on the dressing) from the catheter insertion site. °· The catheter insertion site is bleeding, and the bleeding does not stop after 30 minutes of holding steady pressure on the site. °· The area near or just beyond the catheter insertion site becomes pale, cool, tingly, or numb. °  °This information is not intended to replace advice given to you by your health care provider. Make sure you discuss any questions you have with your health care provider. °  °Document Released: 04/16/2005 Document Revised: 10/19/2014 Document Reviewed: 03/01/2013 °Elsevier Interactive Patient Education ©2016 [employer] ° °

## 2016-02-28 NOTE — Progress Notes (Addendum)
Order for sheath removal verified per post procedural orders. Procedure explained to patient and Rt femoral artery access site assessed: level 0, palpable dorsalis pedis and posterior tibial pulses. 5 [place] Sheath removed and manual pressure applied for 20 minutes. Pre, peri, & post procedural vitals: HR 80's, RR 13, O2 Sat upper 90's, BP 140-160/60-70's, Pain 0. Distal pulses remained intact after sheath removal. Access site level 0 and dressed with 4X4 gauze and tegaderm.  Lavella Lemons, RN confirmed condition of site. Post procedural instructions discussed and return demonstration from patient.

## 2016-02-28 NOTE — Progress Notes (Signed)
Indication: 1. Suspected CAD (No Prior PCI, No Prior CABG, and No Prior Angiogram Showing > = 50% Angiographic Stenosis) 2. Prior Noninvasive Testing: Stress Test With Imaging (SPECT MPI, Stress Echocardiography, Stress PET, Stress CMR) 3. Intermediate-risk findings (e.g., 5% to 10% ischemic myocardium on stress SPECT MPI or stress PET, stress-induced wall motion abnormality in a single segment on stress echo or stress CMR) 4. Pretest Symptom Status: Symptomatic A (7) Indication: 16;  Albert Stanley 02/28/2016

## 2016-02-28 NOTE — H&P (View-Only) (Signed)
PCP: Dr. Delfina Redwood Cardiology: [name]  70 yo with history of HTN, DM, and ESRD presents for cardiology followup.  In 2/17, he was admitted with a right renal subcapsular hematoma.  He had been on warfarin for history of DVT, this was stopped.  Echo during that admission showed EF 35-40% with wall motion abnormalities concerning for CAD.  Cardiac cath was not done as creatinine was high and he was not yet on dialysis. Since that time, he has started HD.  He had a Cardiolite in 4/17 showing a large fixed inferior defect with EF 39%, intermediate risk study.   Patient has had no chest pain.  He has generalized weakness and fatigue.  Not very active, but able to walk around the house without problems and can get around the grocery store.  No palpitations, lightheadedness or syncope. No claudication.  Hemoglobin has been low chronically.   ECG (2/17): NSR, PVCs, nonspecific TW flattening  Labs (3/17): HCT 24.3  Past Medical History: 1. CAD: [place] 2002 showed mild nonobstructive disease with 25% stenosis in LAD.  - Echo (2/17) with EF 35-40%, inferolateral and inferoseptal hypokinesis.  - Cardiolite (4/17) with EF 39%, large inferior/inferolateral primarily fixed defect, intermediate risk study.  2. DM2 3. Obesity 4. ESRD: TTS HD.  5. HTN 6. Gout 7. Low back pain 8. 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 Anemia of renal disease.  10. H/o DVT 11. Right renal subcapsular hematoma in 2/17.  12. Hyperlipidemia.   Family History: Father died from "heart complications" at 28  Social History: Nonsmoker (quit 1998). 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   ROS: All systems reviewed and negative except as per HPI.   Current Outpatient Prescriptions  Medication Sig Dispense Refill  . allopurinol (ZYLOPRIM) 300 MG tablet Take 300 mg by mouth daily.    Marland Kitchen atorvastatin (LIPITOR) 40 MG tablet Take 40 mg by mouth every evening.  0  . BAYER BREEZE  2 TEST DISK Apply 1 each topically daily as needed (FOR TESTING BLOOD SUGAR).   0  . calcitRIOL (ROCALTROL) 0.5 MCG capsule Take 0.5 mcg by mouth daily.   1  . carvedilol (COREG) 3.125 MG tablet Take 1 tablet (3.125 mg total) by mouth 2 (two) times daily with a meal. 60 tablet 3  . cyclobenzaprine (FLEXERIL) 5 MG tablet Take 5 mg by mouth 3 (three) times daily as needed for muscle spasms.    . famotidine (PEPCID) 20 MG tablet Take 1 tablet (20 mg total) by mouth 2 (two) times daily as needed for heartburn.    . fluticasone (FLONASE) 50 MCG/ACT nasal spray Place 1 spray into both nostrils daily as needed for allergies or rhinitis.    Marland Kitchen glipiZIDE (GLUCOTROL) 5 MG tablet Take 1 tablet (5 mg total) by mouth daily before breakfast. 30 tablet 0  . multivitamin (RENA-VIT) TABS tablet Take 2 tablets by mouth 3 (three) times daily.   0  . oxyCODONE-acetaminophen (PERCOCET) 7.5-325 MG tablet Take 1 tablet by mouth every 4 (four) hours as needed for severe pain.    . sevelamer carbonate (RENVELA) 800 MG tablet Take 2 tablets (1,600 mg total) by mouth 3 (three) times daily with meals. 90 tablet 0  . aspirin EC 81 MG tablet Take 1 tablet (81 mg total) by mouth daily. 90 tablet 3   No current facility-administered medications for this encounter.   BP 140/72 mmHg  Pulse 87  Wt 221 lb (100.245 kg)  SpO2 98% General: NAD Neck: No JVD, no thyromegaly or thyroid nodule.  Lungs: Clear to auscultation bilaterally with normal respiratory effort. CV: Nondisplaced PMI.  Heart regular S1/S2, no S3/S4, no murmur.  Trace ankle edema.  No carotid bruit.  Normal pedal pulses.  Abdomen: Soft, nontender, no hepatosplenomegaly, no distention.  Skin: Intact without lesions or rashes.  Neurologic: Alert and oriented x 3.  Psych: Normal affect. Extremities: No clubbing or cyanosis.  HEENT: Normal.   Assessment/Plan: 1. CAD: Strong suspicion for CAD with abnormal echo (low EF and wall motion abnormalities) and Cardiolite  showing low EF and fixed inferior defect.  Multiple RFs: DM, HTN, age/gender, hyperlipidemia.  Patient has fatigue and weakness but has not had chest pain.  - Start ASA 81 daily.  - Continue atorvastatin.  - I think it would be reasonable to do coronary angiography to diagnose CAD and assess degree of disease.  We discussed the procedure and risks/benefits.  He agrees to proceed.  2. 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  EF 35-40% by echo and 39% by Cardiolite. He has ESRD and volume seems well-controlled.  - Add Coreg 3.125 mg bid.  BP should be ok with this but asked him to clear with renal first (will mention my recommendation to start Coreg and ASA 81 at HD tomorrow).   3. Hyperlipidemia: Check lipids today. Goal LDL < 70 with CAD.  He is on atorvastatin.   Loralie Champagne 02/15/2016

## 2016-02-28 NOTE — Interval H&P Note (Signed)
Cath Lab Visit (complete for each Cath Lab visit)  Clinical Evaluation Leading to the Procedure:   ACS: No.  Non-ACS:    Anginal Classification: CCS III  Anti-ischemic medical therapy: Minimal Therapy (1 class of medications)  Non-Invasive Test Results: Intermediate-risk stress test findings: cardiac mortality 1-3%/year  Prior CABG: No previous CABG      History and Physical Interval Note:  02/28/2016 10:51 AM  Albert Stanley  has presented today for surgery, with the diagnosis of hf  The various methods of treatment have been discussed with the patient and family. After consideration of risks, benefits and other options for treatment, the patient has consented to  Procedure(s): Right Heart Cath (N/A) as a surgical intervention .  The patient's history has been reviewed, patient examined, no change in status, stable for surgery.  I have reviewed the patient's chart and labs.  Questions were answered to the patient's satisfaction.     Amia Rynders [employer]

## 2016-02-29 DIAGNOSIS — N186 End stage renal disease: Secondary | ICD-10-CM | POA: Diagnosis not present

## 2016-02-29 DIAGNOSIS — N2581 Secondary hyperparathyroidism of renal origin: Secondary | ICD-10-CM | POA: Diagnosis not present

## 2016-02-29 DIAGNOSIS — E1129 Type 2 diabetes mellitus with other diabetic kidney complication: Secondary | ICD-10-CM | POA: Diagnosis not present

## 2016-02-29 DIAGNOSIS — D631 Anemia in chronic kidney disease: Secondary | ICD-10-CM | POA: Diagnosis not present

## 2016-02-29 DIAGNOSIS — Z23 Encounter for immunization: Secondary | ICD-10-CM | POA: Diagnosis not present

## 2016-02-29 DIAGNOSIS — D509 Iron deficiency anemia, unspecified: Secondary | ICD-10-CM | POA: Diagnosis not present

## 2016-03-02 ENCOUNTER — Encounter (HOSPITAL_COMMUNITY): Payer: Self-pay | Admitting: Cardiology

## 2016-03-03 DIAGNOSIS — D509 Iron deficiency anemia, unspecified: Secondary | ICD-10-CM | POA: Diagnosis not present

## 2016-03-03 DIAGNOSIS — Z23 Encounter for immunization: Secondary | ICD-10-CM | POA: Diagnosis not present

## 2016-03-03 DIAGNOSIS — D631 Anemia in chronic kidney disease: Secondary | ICD-10-CM | POA: Diagnosis not present

## 2016-03-03 DIAGNOSIS — N186 End stage renal disease: Secondary | ICD-10-CM | POA: Diagnosis not present

## 2016-03-03 DIAGNOSIS — N2581 Secondary hyperparathyroidism of renal origin: Secondary | ICD-10-CM | POA: Diagnosis not present

## 2016-03-03 DIAGNOSIS — E1129 Type 2 diabetes mellitus with other diabetic kidney complication: Secondary | ICD-10-CM | POA: Diagnosis not present

## 2016-03-05 DIAGNOSIS — D509 Iron deficiency anemia, unspecified: Secondary | ICD-10-CM | POA: Diagnosis not present

## 2016-03-05 DIAGNOSIS — N2581 Secondary hyperparathyroidism of renal origin: Secondary | ICD-10-CM | POA: Diagnosis not present

## 2016-03-05 DIAGNOSIS — E1129 Type 2 diabetes mellitus with other diabetic kidney complication: Secondary | ICD-10-CM | POA: Diagnosis not present

## 2016-03-05 DIAGNOSIS — Z23 Encounter for immunization: Secondary | ICD-10-CM | POA: Diagnosis not present

## 2016-03-05 DIAGNOSIS — N186 End stage renal disease: Secondary | ICD-10-CM | POA: Diagnosis not present

## 2016-03-05 DIAGNOSIS — D631 Anemia in chronic kidney disease: Secondary | ICD-10-CM | POA: Diagnosis not present

## 2016-03-06 DIAGNOSIS — R972 Elevated prostate specific antigen [PSA]: Secondary | ICD-10-CM | POA: Diagnosis not present

## 2016-03-06 DIAGNOSIS — S37011A Minor contusion of right kidney, initial encounter: Secondary | ICD-10-CM | POA: Diagnosis not present

## 2016-03-06 DIAGNOSIS — N2889 Other specified disorders of kidney and ureter: Secondary | ICD-10-CM | POA: Diagnosis not present

## 2016-03-06 DIAGNOSIS — N281 Cyst of kidney, acquired: Secondary | ICD-10-CM | POA: Diagnosis not present

## 2016-03-07 DIAGNOSIS — D509 Iron deficiency anemia, unspecified: Secondary | ICD-10-CM | POA: Diagnosis not present

## 2016-03-07 DIAGNOSIS — N186 End stage renal disease: Secondary | ICD-10-CM | POA: Diagnosis not present

## 2016-03-07 DIAGNOSIS — D631 Anemia in chronic kidney disease: Secondary | ICD-10-CM | POA: Diagnosis not present

## 2016-03-07 DIAGNOSIS — E1129 Type 2 diabetes mellitus with other diabetic kidney complication: Secondary | ICD-10-CM | POA: Diagnosis not present

## 2016-03-07 DIAGNOSIS — N2581 Secondary hyperparathyroidism of renal origin: Secondary | ICD-10-CM | POA: Diagnosis not present

## 2016-03-07 DIAGNOSIS — Z23 Encounter for immunization: Secondary | ICD-10-CM | POA: Diagnosis not present

## 2016-03-10 DIAGNOSIS — E1129 Type 2 diabetes mellitus with other diabetic kidney complication: Secondary | ICD-10-CM | POA: Diagnosis not present

## 2016-03-10 DIAGNOSIS — Z23 Encounter for immunization: Secondary | ICD-10-CM | POA: Diagnosis not present

## 2016-03-10 DIAGNOSIS — D509 Iron deficiency anemia, unspecified: Secondary | ICD-10-CM | POA: Diagnosis not present

## 2016-03-10 DIAGNOSIS — N2581 Secondary hyperparathyroidism of renal origin: Secondary | ICD-10-CM | POA: Diagnosis not present

## 2016-03-10 DIAGNOSIS — D631 Anemia in chronic kidney disease: Secondary | ICD-10-CM | POA: Diagnosis not present

## 2016-03-10 DIAGNOSIS — N186 End stage renal disease: Secondary | ICD-10-CM | POA: Diagnosis not present

## 2016-03-11 DIAGNOSIS — N186 End stage renal disease: Secondary | ICD-10-CM | POA: Diagnosis not present

## 2016-03-11 DIAGNOSIS — E1122 Type 2 diabetes mellitus with diabetic chronic kidney disease: Secondary | ICD-10-CM | POA: Diagnosis not present

## 2016-03-11 DIAGNOSIS — Z992 Dependence on renal dialysis: Secondary | ICD-10-CM | POA: Diagnosis not present

## 2016-03-12 DIAGNOSIS — E1129 Type 2 diabetes mellitus with other diabetic kidney complication: Secondary | ICD-10-CM | POA: Diagnosis not present

## 2016-03-12 DIAGNOSIS — N186 End stage renal disease: Secondary | ICD-10-CM | POA: Diagnosis not present

## 2016-03-12 DIAGNOSIS — N2581 Secondary hyperparathyroidism of renal origin: Secondary | ICD-10-CM | POA: Diagnosis not present

## 2016-03-12 DIAGNOSIS — D631 Anemia in chronic kidney disease: Secondary | ICD-10-CM | POA: Diagnosis not present

## 2016-03-12 DIAGNOSIS — R509 Fever, unspecified: Secondary | ICD-10-CM | POA: Diagnosis not present

## 2016-03-12 DIAGNOSIS — D509 Iron deficiency anemia, unspecified: Secondary | ICD-10-CM | POA: Diagnosis not present

## 2016-03-13 ENCOUNTER — Other Ambulatory Visit: Payer: Self-pay | Admitting: Urology

## 2016-03-13 DIAGNOSIS — N281 Cyst of kidney, acquired: Secondary | ICD-10-CM | POA: Diagnosis not present

## 2016-03-13 DIAGNOSIS — R972 Elevated prostate specific antigen [PSA]: Secondary | ICD-10-CM | POA: Diagnosis not present

## 2016-03-13 DIAGNOSIS — S37011A Minor contusion of right kidney, initial encounter: Secondary | ICD-10-CM | POA: Diagnosis not present

## 2016-03-13 DIAGNOSIS — Z8551 Personal history of malignant neoplasm of bladder: Secondary | ICD-10-CM | POA: Diagnosis not present

## 2016-03-13 DIAGNOSIS — N2889 Other specified disorders of kidney and ureter: Secondary | ICD-10-CM

## 2016-03-14 DIAGNOSIS — R509 Fever, unspecified: Secondary | ICD-10-CM | POA: Diagnosis not present

## 2016-03-14 DIAGNOSIS — N186 End stage renal disease: Secondary | ICD-10-CM | POA: Diagnosis not present

## 2016-03-14 DIAGNOSIS — N2581 Secondary hyperparathyroidism of renal origin: Secondary | ICD-10-CM | POA: Diagnosis not present

## 2016-03-14 DIAGNOSIS — D631 Anemia in chronic kidney disease: Secondary | ICD-10-CM | POA: Diagnosis not present

## 2016-03-14 DIAGNOSIS — E1129 Type 2 diabetes mellitus with other diabetic kidney complication: Secondary | ICD-10-CM | POA: Diagnosis not present

## 2016-03-14 DIAGNOSIS — D509 Iron deficiency anemia, unspecified: Secondary | ICD-10-CM | POA: Diagnosis not present

## 2016-03-19 DIAGNOSIS — R509 Fever, unspecified: Secondary | ICD-10-CM | POA: Diagnosis not present

## 2016-03-19 DIAGNOSIS — D631 Anemia in chronic kidney disease: Secondary | ICD-10-CM | POA: Diagnosis not present

## 2016-03-19 DIAGNOSIS — N186 End stage renal disease: Secondary | ICD-10-CM | POA: Diagnosis not present

## 2016-03-19 DIAGNOSIS — N2581 Secondary hyperparathyroidism of renal origin: Secondary | ICD-10-CM | POA: Diagnosis not present

## 2016-03-19 DIAGNOSIS — D509 Iron deficiency anemia, unspecified: Secondary | ICD-10-CM | POA: Diagnosis not present

## 2016-03-19 DIAGNOSIS — E1129 Type 2 diabetes mellitus with other diabetic kidney complication: Secondary | ICD-10-CM | POA: Diagnosis not present

## 2016-03-20 DIAGNOSIS — Z7984 Long term (current) use of oral hypoglycemic drugs: Secondary | ICD-10-CM | POA: Diagnosis not present

## 2016-03-20 DIAGNOSIS — I10 Essential (primary) hypertension: Secondary | ICD-10-CM | POA: Diagnosis not present

## 2016-03-20 DIAGNOSIS — N186 End stage renal disease: Secondary | ICD-10-CM | POA: Diagnosis not present

## 2016-03-20 DIAGNOSIS — I5020 Unspecified systolic (congestive) heart failure: Secondary | ICD-10-CM | POA: Diagnosis not present

## 2016-03-20 DIAGNOSIS — S37011D Minor contusion of right kidney, subsequent encounter: Secondary | ICD-10-CM | POA: Diagnosis not present

## 2016-03-20 DIAGNOSIS — M109 Gout, unspecified: Secondary | ICD-10-CM | POA: Diagnosis not present

## 2016-03-20 DIAGNOSIS — E1122 Type 2 diabetes mellitus with diabetic chronic kidney disease: Secondary | ICD-10-CM | POA: Diagnosis not present

## 2016-03-21 DIAGNOSIS — D631 Anemia in chronic kidney disease: Secondary | ICD-10-CM | POA: Diagnosis not present

## 2016-03-21 DIAGNOSIS — N186 End stage renal disease: Secondary | ICD-10-CM | POA: Diagnosis not present

## 2016-03-21 DIAGNOSIS — R509 Fever, unspecified: Secondary | ICD-10-CM | POA: Diagnosis not present

## 2016-03-21 DIAGNOSIS — E1129 Type 2 diabetes mellitus with other diabetic kidney complication: Secondary | ICD-10-CM | POA: Diagnosis not present

## 2016-03-21 DIAGNOSIS — N2581 Secondary hyperparathyroidism of renal origin: Secondary | ICD-10-CM | POA: Diagnosis not present

## 2016-03-21 DIAGNOSIS — D509 Iron deficiency anemia, unspecified: Secondary | ICD-10-CM | POA: Diagnosis not present

## 2016-03-23 DIAGNOSIS — H6123 Impacted cerumen, bilateral: Secondary | ICD-10-CM | POA: Diagnosis not present

## 2016-03-24 DIAGNOSIS — E1129 Type 2 diabetes mellitus with other diabetic kidney complication: Secondary | ICD-10-CM | POA: Diagnosis not present

## 2016-03-24 DIAGNOSIS — D509 Iron deficiency anemia, unspecified: Secondary | ICD-10-CM | POA: Diagnosis not present

## 2016-03-24 DIAGNOSIS — N186 End stage renal disease: Secondary | ICD-10-CM | POA: Diagnosis not present

## 2016-03-24 DIAGNOSIS — D631 Anemia in chronic kidney disease: Secondary | ICD-10-CM | POA: Diagnosis not present

## 2016-03-24 DIAGNOSIS — R509 Fever, unspecified: Secondary | ICD-10-CM | POA: Diagnosis not present

## 2016-03-24 DIAGNOSIS — N2581 Secondary hyperparathyroidism of renal origin: Secondary | ICD-10-CM | POA: Diagnosis not present

## 2016-03-26 DIAGNOSIS — R509 Fever, unspecified: Secondary | ICD-10-CM | POA: Diagnosis not present

## 2016-03-26 DIAGNOSIS — E1129 Type 2 diabetes mellitus with other diabetic kidney complication: Secondary | ICD-10-CM | POA: Diagnosis not present

## 2016-03-26 DIAGNOSIS — N186 End stage renal disease: Secondary | ICD-10-CM | POA: Diagnosis not present

## 2016-03-26 DIAGNOSIS — D509 Iron deficiency anemia, unspecified: Secondary | ICD-10-CM | POA: Diagnosis not present

## 2016-03-26 DIAGNOSIS — D631 Anemia in chronic kidney disease: Secondary | ICD-10-CM | POA: Diagnosis not present

## 2016-03-26 DIAGNOSIS — N2581 Secondary hyperparathyroidism of renal origin: Secondary | ICD-10-CM | POA: Diagnosis not present

## 2016-03-27 DIAGNOSIS — M25551 Pain in right hip: Secondary | ICD-10-CM | POA: Diagnosis not present

## 2016-03-27 DIAGNOSIS — Z79891 Long term (current) use of opiate analgesic: Secondary | ICD-10-CM | POA: Diagnosis not present

## 2016-03-27 DIAGNOSIS — G894 Chronic pain syndrome: Secondary | ICD-10-CM | POA: Diagnosis not present

## 2016-03-27 DIAGNOSIS — M79652 Pain in left thigh: Secondary | ICD-10-CM | POA: Diagnosis not present

## 2016-03-27 DIAGNOSIS — M25571 Pain in right ankle and joints of right foot: Secondary | ICD-10-CM | POA: Diagnosis not present

## 2016-03-27 DIAGNOSIS — M79604 Pain in right leg: Secondary | ICD-10-CM | POA: Diagnosis not present

## 2016-03-28 DIAGNOSIS — D509 Iron deficiency anemia, unspecified: Secondary | ICD-10-CM | POA: Diagnosis not present

## 2016-03-28 DIAGNOSIS — E1129 Type 2 diabetes mellitus with other diabetic kidney complication: Secondary | ICD-10-CM | POA: Diagnosis not present

## 2016-03-28 DIAGNOSIS — R509 Fever, unspecified: Secondary | ICD-10-CM | POA: Diagnosis not present

## 2016-03-28 DIAGNOSIS — D631 Anemia in chronic kidney disease: Secondary | ICD-10-CM | POA: Diagnosis not present

## 2016-03-28 DIAGNOSIS — N186 End stage renal disease: Secondary | ICD-10-CM | POA: Diagnosis not present

## 2016-03-28 DIAGNOSIS — N2581 Secondary hyperparathyroidism of renal origin: Secondary | ICD-10-CM | POA: Diagnosis not present

## 2016-03-29 NOTE — Progress Notes (Signed)
Cardiology Office Note:    Date:  03/30/2016   ID:  [name], DOB 1945-11-09, MRN [id number]  PCP:  [name], MD  Cardiologist:  Dr. Loralie Champagne ([facility])  Electrophysiologist:  N/a Nephrologist: Dr. [name]  Referring MD: Albert Carol, MD   Chief Complaint  Patient presents with  . Cardiomyopathy    s/p cardiac cath    History of Present Illness:     Albert Stanley is a 70 y.o. male with a hx of HTN, diabetes, ESRD. Patient was admitted in 2/17 with right renal subscapular hematoma. He had previously been on warfarin for history of DVT and this was stopped. Ejection fraction by echocardiogram that admission was 35-40% with wall motion abnormalities concerning for CAD. Cardiac catheterization was deferred given his advanced chronic kidney disease. The patient was not yet on dialysis. Since that time, he has started on hemodialysis. Myoview in 4/17 demonstrated large fixed inferior defect, EF 39%, intermediate study. He was last seen by [name] 02/14/16. Cardiac catheterization was recommended to assess for CAD. This was performed 02/28/16 and demonstrated luminal irregularities, EF 40%.  Returns for FU.  Here with his wife.  He is doing well since his cardiac cath. He denies chest pain, significant dyspnea, orthopnea, PND, edema.  He denies syncope.  He was placed on Coreg when last seen by Dr. Loralie Champagne.  However, this was stopped by his Nephrologist.  [name] is not sure why it was stopped.  He has had low BP at dialysis in the past.     Past Medical History  Diagnosis Date  . Hypertension   . Diabetes mellitus without complication ([place])   . Asthma   . Hyperlipidemia   . History of DVT (deep vein thrombosis)   . ESRD (end stage renal disease) ([name])     Tues, Thurs, Sat dialysis  . Bladder cancer ([place]) dx'd 1990    surg only  . NICM (nonischemic cardiomyopathy) ([place])     a. Echo 2/17: mod LVH, EF 35-40%, inf-lat and inf-sept HK, mod MR, severe LAE,  small pericardial effusion  . History of nuclear stress test     a. Myoview 4/17: EF 39%, inf, inf-lat, apical inf, apical lat, apical scar, intermediate risk  . History of cardiac catheterization     a. LHC 5/17: no obstructive CAD  1. CAD: [facility] 2002 showed mild nonobstructive disease with 25% stenosis in LAD.  - Echo (2/17) with EF 35-40%, inferolateral and inferoseptal hypokinesis.  - Cardiolite (4/17) with EF 39%, large inferior/inferolateral primarily fixed defect, intermediate risk study.  2. DM2 3. Obesity 4. ESRD: TTS HD.  5. HTN 6. Gout 7. Low back pain 8. 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 Anemia of renal disease.  10. H/o DVT 11. Right renal subcapsular hematoma in 2/17.  12. Hyperlipidemia.   Past Surgical History  Procedure Laterality Date  . Back surgery      2 times  . Joint replacement      hip  . Arthroscopic knee surgery    . Cystoscopy    . Av fistula placement Left 12/13/2015    Procedure: ARTERIOVENOUS (AV) FISTULA CREATION;  Surgeon: Angelia Mould, MD;  Location: [place];  Service: Vascular;  Laterality: Left;  . Insertion of dialysis catheter N/A 12/13/2015    Procedure: INSERTION OF DIALYSIS CATHETER;  Surgeon: Angelia Mould, MD;  Location: [place];  Service: Vascular;  Laterality: N/A;  . Cardiac catheterization N/A 02/28/2016  Procedure: Left Heart Cath and Coronary Angiography;  Surgeon: Albert Dresser, MD;  Location: [place] [facility];  Service: Cardiovascular;  Laterality: N/A;    Current Medications: Outpatient Prescriptions Prior to Visit  Medication Sig Dispense Refill  . allopurinol (ZYLOPRIM) 300 MG tablet Take 300 mg by mouth daily.    Marland Kitchen aspirin EC 81 MG tablet Take 1 tablet (81 mg total) by mouth daily. 90 tablet 3  . atorvastatin (LIPITOR) 40 MG tablet Take 40 mg by mouth every evening.  0  . BAYER BREEZE 2 TEST DISK Apply 1 each topically daily as needed (FOR TESTING BLOOD SUGAR).   0  . calcitRIOL  (ROCALTROL) 0.5 MCG capsule Take 0.5 mcg by mouth 3 (three) times a week. Medication given at dialysis on Tues Thurs Sat  1  . cyclobenzaprine (FLEXERIL) 5 MG tablet Take 5 mg by mouth 3 (three) times daily as needed for muscle spasms.    . famotidine (PEPCID) 20 MG tablet Take 1 tablet (20 mg total) by mouth 2 (two) times daily as needed for heartburn.    . fluticasone (FLONASE) 50 MCG/ACT nasal spray Place 1 spray into both nostrils daily as needed for allergies or rhinitis.    Marland Kitchen glipiZIDE (GLUCOTROL) 5 MG tablet Take 1 tablet (5 mg total) by mouth daily before breakfast. 30 tablet 0  . multivitamin (RENA-VIT) TABS tablet Take 1 tablet by mouth at bedtime.   0  . oxyCODONE-acetaminophen (PERCOCET) 7.5-325 MG tablet Take 1 tablet by mouth every 4 (four) hours as needed for severe pain.    . sevelamer carbonate (RENVELA) 800 MG tablet Take 2 tablets (1,600 mg total) by mouth 3 (three) times daily with meals. 90 tablet 0  . carvedilol (COREG) 3.125 MG tablet Take 1 tablet (3.125 mg total) by mouth 2 (two) times daily with a meal. (Patient not taking: Reported on 03/30/2016) 60 tablet 3  . fexofenadine (ALLEGRA) 60 MG tablet Take 60 mg by mouth daily. Reported on 03/30/2016     No facility-administered medications prior to visit.      Allergies:   Eggs or egg-derived products; Foltx; Lac bovis; Meperidine; Meperidine hcl; Milk-related compounds; Penicillin g; Penicillins; Povidone iodine; Sulfa antibiotics; Sulfonamide derivatives; and Povidone-iodine   Social History   Social History  . Marital Status: Married    Spouse Name: N/A  . Number of Children: N/A  . Years of Education: N/A   Occupational History  . Retired    Social History Main Topics  . Smoking status: Former Research scientist (life sciences)  . Smokeless tobacco: Never Used  . Alcohol Use: No     Comment: Used to drink on the weekends, quit 2 years ago  . Drug Use: No  . Sexual Activity: No   Other Topics Concern  . None   Social History  Narrative   Lives with wife and daughter.     Family History:  The patient's family history includes Alcohol abuse in his brother; Arthritis in his mother; Cancer in his father; Diabetes in his daughter; Heart attack in his paternal uncle; Heart disease in his father; Hypertension in his sister.   ROS:   Please see the history of present illness.    ROS All other systems reviewed and are negative.   Physical Exam:    VS:  BP 168/70 mmHg  Pulse 88  Ht 5\' 11"  (1.803 m)  Wt 223 lb (101.152 kg)  BMI 31.12 kg/m2   Physical Exam  Constitutional: He is oriented to person,  place, and time. He appears well-developed and well-nourished.  HENT:  Head: Normocephalic and atraumatic.  Neck: No JVD present.  Cardiovascular: Normal rate, regular rhythm and normal heart sounds.   No murmur heard. Pulmonary/Chest: Effort normal and breath sounds normal. He has no wheezes. He has no rales.  Abdominal: Soft. There is no tenderness.  Musculoskeletal: He exhibits no edema.  R groin without hematoma or bruit    Neurological: He is alert and oriented to person, place, and time.  Skin: Skin is warm and dry.  Psychiatric: He has a normal mood and affect.    Wt Readings from Last 3 Encounters:  03/30/16 223 lb (101.152 kg)  02/14/16 221 lb (100.245 kg)  02/05/16 224 lb 9.6 oz (101.878 kg)      Studies/Labs Reviewed:     EKG:  EKG is  ordered today.  The ekg ordered today demonstrates NSR, HR 88, LAD, IVCD, PVC, QTc 488 ms  Recent Labs: 12/12/2015: Magnesium 2.3 12/16/2015: ALT 15* 02/28/2016: BUN 28*; Creatinine, Ser 4.34*; Hemoglobin 11.1*; Platelets 137*; Potassium 3.7; Sodium 141   Recent Lipid Panel    Component Value Date/Time   CHOL 91 02/14/2016 1030   TRIG 46 02/14/2016 1030   HDL 37* 02/14/2016 1030   CHOLHDL 2.5 02/14/2016 1030   VLDL 9 02/14/2016 1030   LDLCALC 45 02/14/2016 1030    Additional studies/ records that were reviewed today include:   LHC 02/28/16 1. No  obstructive CAD. 2. EF about 40% with inferior severe hypokinesis (though RCA looks ok).  Myoview 01/24/16  Nuclear stress EF: 39%.  There was no ST segment deviation noted during stress.  Defect 1: There is a large defect of moderate severity present in the mid inferior, mid inferolateral, apical inferior, apical lateral and apex location.  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  This is an intermediate risk study.  The left ventricular ejection fraction is moderately decreased (30-44%).  The LV is markedly dilated.  Echo 2/17 - Left ventricle: The cavity size was mildly dilated. There was  moderate concentric hypertrophy. Systolic function was moderately  reduced. The estimated ejection fraction was in the range of 35%  to 40%. Hypokinesis of the inferolateral and inferoseptal  myocardium. - Aortic valve: There was trivial regurgitation. - Mitral valve: There was moderate regurgitation. - Left atrium: The atrium was severely dilated. - Pericardium, extracardiac: A small pericardial effusion was  identified.  ASSESSMENT:     1. NICM (nonischemic cardiomyopathy) ([place])   2. Hyperlipidemia   3. Essential hypertension   4. ESRD (end stage renal disease) on dialysis [facility])     PLAN:     In order of problems listed above:  1. NICM - Mr. Deharo recently had LHC that demonstrated luminal irregs but no obstructive CAD.  He is NYHA 2-2b.  Volume is managed with dialysis.  He was previously placed on Coreg, but this was stopped by Nephrology.    -  Dr. Deterding's office was contacted and the last note indicated his Coreg was changed to 3.125 mg bid (non-dialysis days).  This was DC'd b/c there was no indication from out office that it should be continued.  -  Restart Coreg 3.125 mg bid (hold on dialysis days)  -  FU with Dr. Loralie Champagne in 3-4 weeks in [facility].  -  Eventually start low dose ACE inhibitor if BP will allow  2. HL - Continue  statin given risk equivalent of diabetes.  3. HTN - BP  elevated today.  He is not taking any medications.  Resume beta blocker as noted.   4. ESRD - He is on Tues, Thurs, Sat dialysis.     Medication Adjustments/Labs and Tests Ordered: Current medicines are reviewed at length with the patient today.  Concerns regarding medicines are outlined above.  Medication changes, Labs and Tests ordered today are outlined in the Patient Instructions noted below. Patient Instructions  Medication Instructions:  RE-START COREG 3.125 MG 1 TABLET TWICE DAILY; WITH THE DIRECTIONS TO HOLD ON DIALYSIS DAYS; THESE ARE THE SAME INSTRUCTIONS PREVIOUSLY FROM DR. DETERDING Labwork: NONE Testing/Procedures: NONE Follow-Up: [name] 3-4 WEEKS IN THE HEART FAILURE CLINIC  Any Other Special Instructions Will Be Listed Below (If Applicable). If you need a refill on your cardiac medications before your next appointment, please call your pharmacy.   Signed, Richardson Dopp, PA-C  03/30/2016 5:13 PM    [facility] [place], [place],   [address] Phone: [phone]; Fax: [phone]

## 2016-03-30 ENCOUNTER — Encounter: Payer: Self-pay | Admitting: Physician Assistant

## 2016-03-30 ENCOUNTER — Ambulatory Visit (INDEPENDENT_AMBULATORY_CARE_PROVIDER_SITE_OTHER): Payer: Medicare Other | Admitting: Physician Assistant

## 2016-03-30 VITALS — BP 168/70 | HR 88 | Ht 71.0 in | Wt 223.0 lb

## 2016-03-30 DIAGNOSIS — I10 Essential (primary) hypertension: Secondary | ICD-10-CM

## 2016-03-30 DIAGNOSIS — Z992 Dependence on renal dialysis: Secondary | ICD-10-CM

## 2016-03-30 DIAGNOSIS — N186 End stage renal disease: Secondary | ICD-10-CM | POA: Diagnosis not present

## 2016-03-30 DIAGNOSIS — I429 Cardiomyopathy, unspecified: Secondary | ICD-10-CM

## 2016-03-30 DIAGNOSIS — I2510 Atherosclerotic heart disease of native coronary artery without angina pectoris: Secondary | ICD-10-CM

## 2016-03-30 DIAGNOSIS — E785 Hyperlipidemia, unspecified: Secondary | ICD-10-CM

## 2016-03-30 DIAGNOSIS — I428 Other cardiomyopathies: Secondary | ICD-10-CM

## 2016-03-30 MED ORDER — CARVEDILOL 3.125 MG PO TABS: 3.1250 mg | ORAL_TABLET | ORAL | Status: DC

## 2016-03-30 NOTE — Patient Instructions (Addendum)
Medication Instructions:  RE-START COREG 3.125 MG 1 TABLET TWICE DAILY; WITH THE DIRECTIONS TO HOLD ON DIALYSIS DAYS; THESE ARE THE SAME INSTRUCTIONS PREVIOUSLY FROM DR. DETERDING Labwork: NONE Testing/Procedures: NONE Follow-Up: [name] 3-4 WEEKS IN THE HEART FAILURE CLINIC  Any Other Special Instructions Will Be Listed Below (If Applicable). If you need a refill on your cardiac medications before your next appointment, please call your pharmacy.

## 2016-03-31 DIAGNOSIS — E1129 Type 2 diabetes mellitus with other diabetic kidney complication: Secondary | ICD-10-CM | POA: Diagnosis not present

## 2016-03-31 DIAGNOSIS — D509 Iron deficiency anemia, unspecified: Secondary | ICD-10-CM | POA: Diagnosis not present

## 2016-03-31 DIAGNOSIS — N186 End stage renal disease: Secondary | ICD-10-CM | POA: Diagnosis not present

## 2016-03-31 DIAGNOSIS — D631 Anemia in chronic kidney disease: Secondary | ICD-10-CM | POA: Diagnosis not present

## 2016-03-31 DIAGNOSIS — N2581 Secondary hyperparathyroidism of renal origin: Secondary | ICD-10-CM | POA: Diagnosis not present

## 2016-03-31 DIAGNOSIS — R509 Fever, unspecified: Secondary | ICD-10-CM | POA: Diagnosis not present

## 2016-04-01 ENCOUNTER — Telehealth (HOSPITAL_COMMUNITY): Payer: Self-pay | Admitting: Vascular Surgery

## 2016-04-01 ENCOUNTER — Telehealth: Payer: Self-pay | Admitting: Cardiology

## 2016-04-01 NOTE — Telephone Encounter (Signed)
No VM set up. Will send pt letter about up coming appt

## 2016-04-01 NOTE — Telephone Encounter (Signed)
Do you have a number?

## 2016-04-01 NOTE — Telephone Encounter (Signed)
New message    The office is calling needing to speak with [name] from Dr. Cherylin Mylar about changing the pt medications, the docotor wants to speak with the other doctor no other information given

## 2016-04-01 NOTE — Telephone Encounter (Signed)
Will send to Dr [name]

## 2016-04-02 DIAGNOSIS — R509 Fever, unspecified: Secondary | ICD-10-CM | POA: Diagnosis not present

## 2016-04-02 DIAGNOSIS — D509 Iron deficiency anemia, unspecified: Secondary | ICD-10-CM | POA: Diagnosis not present

## 2016-04-02 DIAGNOSIS — N186 End stage renal disease: Secondary | ICD-10-CM | POA: Diagnosis not present

## 2016-04-02 DIAGNOSIS — E1129 Type 2 diabetes mellitus with other diabetic kidney complication: Secondary | ICD-10-CM | POA: Diagnosis not present

## 2016-04-02 DIAGNOSIS — D631 Anemia in chronic kidney disease: Secondary | ICD-10-CM | POA: Diagnosis not present

## 2016-04-02 DIAGNOSIS — N2581 Secondary hyperparathyroidism of renal origin: Secondary | ICD-10-CM | POA: Diagnosis not present

## 2016-04-02 NOTE — Telephone Encounter (Signed)
[phone] is office number, that was only number left

## 2016-04-03 ENCOUNTER — Encounter (HOSPITAL_COMMUNITY): Payer: Medicare Other

## 2016-04-07 DIAGNOSIS — R509 Fever, unspecified: Secondary | ICD-10-CM | POA: Diagnosis not present

## 2016-04-07 DIAGNOSIS — N186 End stage renal disease: Secondary | ICD-10-CM | POA: Diagnosis not present

## 2016-04-07 DIAGNOSIS — D509 Iron deficiency anemia, unspecified: Secondary | ICD-10-CM | POA: Diagnosis not present

## 2016-04-07 DIAGNOSIS — E1129 Type 2 diabetes mellitus with other diabetic kidney complication: Secondary | ICD-10-CM | POA: Diagnosis not present

## 2016-04-07 DIAGNOSIS — N2581 Secondary hyperparathyroidism of renal origin: Secondary | ICD-10-CM | POA: Diagnosis not present

## 2016-04-07 DIAGNOSIS — D631 Anemia in chronic kidney disease: Secondary | ICD-10-CM | POA: Diagnosis not present

## 2016-04-08 ENCOUNTER — Ambulatory Visit
Admission: RE | Admit: 2016-04-08 | Discharge: 2016-04-08 | Disposition: A | Discharge status: Home / Self Care | Payer: Medicare Other | Source: Ambulatory Visit | Attending: Urology | Admitting: Urology

## 2016-04-08 DIAGNOSIS — N2889 Other specified disorders of kidney and ureter: Secondary | ICD-10-CM

## 2016-04-09 DIAGNOSIS — D509 Iron deficiency anemia, unspecified: Secondary | ICD-10-CM | POA: Diagnosis not present

## 2016-04-09 DIAGNOSIS — R509 Fever, unspecified: Secondary | ICD-10-CM | POA: Diagnosis not present

## 2016-04-09 DIAGNOSIS — E1129 Type 2 diabetes mellitus with other diabetic kidney complication: Secondary | ICD-10-CM | POA: Diagnosis not present

## 2016-04-09 DIAGNOSIS — N2581 Secondary hyperparathyroidism of renal origin: Secondary | ICD-10-CM | POA: Diagnosis not present

## 2016-04-09 DIAGNOSIS — N186 End stage renal disease: Secondary | ICD-10-CM | POA: Diagnosis not present

## 2016-04-09 DIAGNOSIS — D631 Anemia in chronic kidney disease: Secondary | ICD-10-CM | POA: Diagnosis not present

## 2016-04-10 DIAGNOSIS — N186 End stage renal disease: Secondary | ICD-10-CM | POA: Diagnosis not present

## 2016-04-10 DIAGNOSIS — Z992 Dependence on renal dialysis: Secondary | ICD-10-CM | POA: Diagnosis not present

## 2016-04-10 DIAGNOSIS — E1122 Type 2 diabetes mellitus with diabetic chronic kidney disease: Secondary | ICD-10-CM | POA: Diagnosis not present

## 2016-04-11 DIAGNOSIS — E1129 Type 2 diabetes mellitus with other diabetic kidney complication: Secondary | ICD-10-CM | POA: Diagnosis not present

## 2016-04-11 DIAGNOSIS — D631 Anemia in chronic kidney disease: Secondary | ICD-10-CM | POA: Diagnosis not present

## 2016-04-11 DIAGNOSIS — Z23 Encounter for immunization: Secondary | ICD-10-CM | POA: Diagnosis not present

## 2016-04-11 DIAGNOSIS — D509 Iron deficiency anemia, unspecified: Secondary | ICD-10-CM | POA: Diagnosis not present

## 2016-04-11 DIAGNOSIS — N2581 Secondary hyperparathyroidism of renal origin: Secondary | ICD-10-CM | POA: Diagnosis not present

## 2016-04-11 DIAGNOSIS — N186 End stage renal disease: Secondary | ICD-10-CM | POA: Diagnosis not present

## 2016-04-14 DIAGNOSIS — N2581 Secondary hyperparathyroidism of renal origin: Secondary | ICD-10-CM | POA: Diagnosis not present

## 2016-04-14 DIAGNOSIS — E1129 Type 2 diabetes mellitus with other diabetic kidney complication: Secondary | ICD-10-CM | POA: Diagnosis not present

## 2016-04-14 DIAGNOSIS — D509 Iron deficiency anemia, unspecified: Secondary | ICD-10-CM | POA: Diagnosis not present

## 2016-04-14 DIAGNOSIS — N186 End stage renal disease: Secondary | ICD-10-CM | POA: Diagnosis not present

## 2016-04-14 DIAGNOSIS — D631 Anemia in chronic kidney disease: Secondary | ICD-10-CM | POA: Diagnosis not present

## 2016-04-14 DIAGNOSIS — Z23 Encounter for immunization: Secondary | ICD-10-CM | POA: Diagnosis not present

## 2016-04-16 DIAGNOSIS — D631 Anemia in chronic kidney disease: Secondary | ICD-10-CM | POA: Diagnosis not present

## 2016-04-16 DIAGNOSIS — N2581 Secondary hyperparathyroidism of renal origin: Secondary | ICD-10-CM | POA: Diagnosis not present

## 2016-04-16 DIAGNOSIS — E1129 Type 2 diabetes mellitus with other diabetic kidney complication: Secondary | ICD-10-CM | POA: Diagnosis not present

## 2016-04-16 DIAGNOSIS — N186 End stage renal disease: Secondary | ICD-10-CM | POA: Diagnosis not present

## 2016-04-16 DIAGNOSIS — D509 Iron deficiency anemia, unspecified: Secondary | ICD-10-CM | POA: Diagnosis not present

## 2016-04-16 DIAGNOSIS — Z23 Encounter for immunization: Secondary | ICD-10-CM | POA: Diagnosis not present

## 2016-04-18 DIAGNOSIS — N186 End stage renal disease: Secondary | ICD-10-CM | POA: Diagnosis not present

## 2016-04-18 DIAGNOSIS — N2581 Secondary hyperparathyroidism of renal origin: Secondary | ICD-10-CM | POA: Diagnosis not present

## 2016-04-18 DIAGNOSIS — Z23 Encounter for immunization: Secondary | ICD-10-CM | POA: Diagnosis not present

## 2016-04-18 DIAGNOSIS — D509 Iron deficiency anemia, unspecified: Secondary | ICD-10-CM | POA: Diagnosis not present

## 2016-04-18 DIAGNOSIS — E1129 Type 2 diabetes mellitus with other diabetic kidney complication: Secondary | ICD-10-CM | POA: Diagnosis not present

## 2016-04-18 DIAGNOSIS — D631 Anemia in chronic kidney disease: Secondary | ICD-10-CM | POA: Diagnosis not present

## 2016-04-21 DIAGNOSIS — Z23 Encounter for immunization: Secondary | ICD-10-CM | POA: Diagnosis not present

## 2016-04-21 DIAGNOSIS — D631 Anemia in chronic kidney disease: Secondary | ICD-10-CM | POA: Diagnosis not present

## 2016-04-21 DIAGNOSIS — D509 Iron deficiency anemia, unspecified: Secondary | ICD-10-CM | POA: Diagnosis not present

## 2016-04-21 DIAGNOSIS — N2581 Secondary hyperparathyroidism of renal origin: Secondary | ICD-10-CM | POA: Diagnosis not present

## 2016-04-21 DIAGNOSIS — N186 End stage renal disease: Secondary | ICD-10-CM | POA: Diagnosis not present

## 2016-04-21 DIAGNOSIS — E1129 Type 2 diabetes mellitus with other diabetic kidney complication: Secondary | ICD-10-CM | POA: Diagnosis not present

## 2016-04-23 DIAGNOSIS — N2581 Secondary hyperparathyroidism of renal origin: Secondary | ICD-10-CM | POA: Diagnosis not present

## 2016-04-23 DIAGNOSIS — N186 End stage renal disease: Secondary | ICD-10-CM | POA: Diagnosis not present

## 2016-04-23 DIAGNOSIS — D631 Anemia in chronic kidney disease: Secondary | ICD-10-CM | POA: Diagnosis not present

## 2016-04-23 DIAGNOSIS — E785 Hyperlipidemia, unspecified: Secondary | ICD-10-CM | POA: Diagnosis not present

## 2016-04-23 DIAGNOSIS — Z23 Encounter for immunization: Secondary | ICD-10-CM | POA: Diagnosis not present

## 2016-04-23 DIAGNOSIS — D509 Iron deficiency anemia, unspecified: Secondary | ICD-10-CM | POA: Diagnosis not present

## 2016-04-23 DIAGNOSIS — E1129 Type 2 diabetes mellitus with other diabetic kidney complication: Secondary | ICD-10-CM | POA: Diagnosis not present

## 2016-04-23 NOTE — Consult Note (Signed)
Chief Complaint: Patient was seen in consultation today for possible cryoablation of right renal mass at the request of Grapey,David  Referring Physician(s): Grapey,David  History of Present Illness: Albert Stanley is a 70 y.o. male with a history of end-stage renal disease who recently started hemodialysis in March and is followed by Dr. [name]. He was also on Coumadin for a history of DVT and presented on 12/04/2015 with development of a large spontaneous right-sided subcapsular hemorrhage as well as hemorrhage within a 5.2 cm rounded area emanating from the posterior right kidney potentially representing hemorrhage into a cyst. Follow-up CT on 03/06/2016 was performed with and without contrast at [facility]. This demonstrated near-complete resolution of the subcapsular right renal hematoma. Exophytic posterolateral interpolar right renal lesion showed decrease in size after the acute hemorrhagic episode and measured approximately 2.5 x 1.8 x 2.87 m. After initiation of contrast, there was question raised of potential subtle peripheral enhancement of this region. Other bilateral hemorrhagic/proteinaceous cysts are also identified by CT. The patient is now off Coumadin. He is still experiencing some periodic right flank pain. He denies hematuria.  Past Medical History  Diagnosis Date  . Hypertension   . Diabetes mellitus without complication ([place])   . Asthma   . Hyperlipidemia   . History of DVT (deep vein thrombosis)   . ESRD (end stage renal disease) ([name])     Tues, Thurs, Sat dialysis  . Bladder cancer ([place]) dx'd 1990    surg only  . NICM (nonischemic cardiomyopathy) ([place])     a. Echo 2/17: mod LVH, EF 35-40%, inf-lat and inf-sept HK, mod MR, severe LAE, small pericardial effusion  . History of nuclear stress test     a. Myoview 4/17: EF 39%, inf, inf-lat, apical inf, apical lat, apical scar, intermediate risk  . History of cardiac catheterization     a. LHC 5/17: no  obstructive CAD    Past Surgical History  Procedure Laterality Date  . Back surgery      2 times  . Joint replacement      hip  . Arthroscopic knee surgery    . Cystoscopy    . Av fistula placement Left 12/13/2015    Procedure: ARTERIOVENOUS (AV) FISTULA CREATION;  Surgeon: Angelia Mould, MD;  Location: [place];  Service: Vascular;  Laterality: Left;  . Insertion of dialysis catheter N/A 12/13/2015    Procedure: INSERTION OF DIALYSIS CATHETER;  Surgeon: Angelia Mould, MD;  Location: [place];  Service: Vascular;  Laterality: N/A;  . Cardiac catheterization N/A 02/28/2016    Procedure: Left Heart Cath and Coronary Angiography;  Surgeon: Larey Dresser, MD;  Location: [place] [facility];  Service: Cardiovascular;  Laterality: N/A;    Allergies: Eggs or egg-derived products; Foltx; Lac bovis; Meperidine; Meperidine hcl; Milk-related compounds; Penicillin g; Penicillins; Povidone iodine; Sulfa antibiotics; Sulfonamide derivatives; and Povidone-iodine  Medications: Prior to Admission medications   Medication Sig Start Date End Date Taking? Authorizing Provider  allopurinol (ZYLOPRIM) 300 MG tablet Take 300 mg by mouth daily.   Yes Historical Provider, MD  aspirin EC 81 MG tablet Take 1 tablet (81 mg total) by mouth daily. 02/14/16  Yes Larey Dresser, MD  atorvastatin (LIPITOR) 40 MG tablet Take 40 mg by mouth every evening. 01/21/16  Yes Historical Provider, MD  BAYER BREEZE 2 TEST DISK Apply 1 each topically daily as needed (FOR TESTING BLOOD SUGAR).  11/13/15  Yes Historical Provider, MD  calcitRIOL (ROCALTROL) 0.5 MCG capsule Take 0.5 mcg by mouth 3 (three) times a week. Medication given at dialysis on Tues Thurs Sat 11/13/15  Yes Historical Provider, MD  carvedilol (COREG) 3.125 MG tablet Take 1 tablet (3.125 mg total) by mouth as directed. 1 TABLET TWICE DAILY; WITH EXCEPTION OF HOLDING ON DIALYSIS DAYS 03/30/16  Yes Scott Joylene Draft, PA-C  cyclobenzaprine (FLEXERIL) 5 MG tablet Take 5  mg by mouth 3 (three) times daily as needed for muscle spasms.   Yes Historical Provider, MD  famotidine (PEPCID) 20 MG tablet Take 1 tablet (20 mg total) by mouth 2 (two) times daily as needed for heartburn. 12/18/15  Yes Jessica U Vann, DO  fluticasone (FLONASE) 50 MCG/ACT nasal spray Place 1 spray into both nostrils daily as needed for allergies or rhinitis.   Yes Historical Provider, MD  glipiZIDE (GLUCOTROL) 5 MG tablet Take 1 tablet (5 mg total) by mouth daily before breakfast. 12/18/15  Yes Geradine Girt, DO  lidocaine-prilocaine (EMLA) cream APPLY TO SKIN AS NEED FOR  PREDIALYSIS 03/20/16  Yes Historical Provider, MD  Loratadine-Pseudoephedrine (CLARITIN-D 24 HOUR PO) Take 1 tablet by mouth daily. PATIENT NOT SURE OF THE DOSAGE   Yes Historical Provider, MD  multivitamin (RENA-VIT) TABS tablet Take 1 tablet by mouth at bedtime.  12/20/15  Yes Historical Provider, MD  oxyCODONE-acetaminophen (PERCOCET) 7.5-325 MG tablet Take 1 tablet by mouth every 4 (four) hours as needed for severe pain.   Yes Historical Provider, MD  sevelamer carbonate (RENVELA) 800 MG tablet Take 2 tablets (1,600 mg total) by mouth 3 (three) times daily with meals. 12/18/15  Yes Geradine Girt, DO     Family History  Problem Relation Age of Onset  . Arthritis Mother   . Cancer Father   . Heart disease Father   . Hypertension Sister   . Alcohol abuse Brother   . Diabetes Daughter   . Heart attack Paternal Uncle     Social History   Social History  . Marital Status: Married    Spouse Name: N/A  . Number of Children: N/A  . Years of Education: N/A   Occupational History  . Retired    Social History Main Topics  . Smoking status: Former Research scientist (life sciences)  . Smokeless tobacco: Never Used  . Alcohol Use: No     Comment: Used to drink on the weekends, quit 2 years ago  . Drug Use: No  . Sexual Activity: No   Other Topics Concern  . Not on file   Social History Narrative   Lives with wife and daughter.    Review of  Systems: A 12 point ROS discussed and pertinent positives are indicated in the HPI above.  All other systems are negative.  Review of Systems  Constitutional: Negative.   HENT: Negative.   Respiratory: Negative.   Cardiovascular: Negative.   Genitourinary: Positive for flank pain. Negative for dysuria, frequency, hematuria and difficulty urinating.  Musculoskeletal: Negative.   Neurological: Negative.     Vital Signs: BP 153/91 mmHg  Pulse 92  Temp(Src) 98.2 F (36.8 C) (Oral)  Resp 14  Ht '5\' 11"'  (1.803 m)  Wt 217 lb (98.431 kg)  BMI 30.28 kg/m2  SpO2 98%  Physical Exam  Constitutional: He is oriented to person, place, and time. He appears well-developed and well-nourished. No distress.  HENT:  Head: Normocephalic and atraumatic.  Neck: Neck supple. No JVD present. No tracheal deviation present. No thyromegaly present.  Cardiovascular: Normal rate, regular  rhythm and normal heart sounds.  Exam reveals no gallop and no friction rub.   No murmur heard. Pulmonary/Chest: Effort normal and breath sounds normal. No stridor. No respiratory distress. He has no wheezes. He has no rales.  Abdominal: Soft. Bowel sounds are normal. He exhibits no distension and no mass. There is no tenderness. There is no rebound and no guarding.  Musculoskeletal: He exhibits no edema.  Lymphadenopathy:    He has no cervical adenopathy.  Neurological: He is alert and oriented to person, place, and time.  Skin: He is not diaphoretic.  Nursing note and vitals reviewed.   Imaging: No results found.  Labs:  CBC:  Recent Labs  12/16/15 0354 12/17/15 0548 12/18/15 0621 02/28/16 0857  WBC 8.2 8.2 9.2 5.2  HGB 7.8* 8.2* 8.3* 11.1*  HCT 23.3* 24.5* 24.3* 35.5*  PLT 304 295 262 137*    COAGS:  Recent Labs  12/07/15 0507 12/11/15 0647 12/13/15 0500 02/28/16 0857  INR 1.60* 1.45 1.48 1.27    BMP:  Recent Labs  12/16/15 1700 12/17/15 0548 12/18/15 0621 02/28/16 0857  NA 132* 131*  132* 141  K 4.5 4.4 4.5 3.7  CL 95* 96* 95* 101  CO2 '24 23 25 29  ' GLUCOSE 152* 175* 132* 110*  BUN 79* 81* 47* 28*  CALCIUM 9.1 8.8* 8.6* 9.0  CREATININE 6.45* 6.37* 5.04* 4.34*  GFRNONAA 8* 8* 10* 13*  GFRAA 9* 9* 12* 15*    LIVER FUNCTION TESTS:  Recent Labs  12/04/15 0022 12/05/15 0616  12/14/15 0735 12/16/15 1700 12/17/15 0548 12/18/15 0621  BILITOT 0.5 0.8  --   --  0.6  --   --   AST 28 26  --   --  24  --   --   ALT 18 16*  --   --  15*  --   --   ALKPHOS 48 41  --   --  50  --   --   PROT 6.6 6.5  --   --  5.9*  --   --   ALBUMIN 3.6 3.4*  < > 2.8* 3.0* 2.9* 2.8*  < > = values in this interval not displayed.   Assessment and Plan:  I met with Albert Stanley and his wife Albert Stanley. 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 We reviewed imaging studies. The enlarged high density lesion associated with the acute right subcapsular hemorrhage has the appearance of a hemorrhagic cyst on the original CT. It is reassuring by imaging that this lesion has decreased in size significantly. The suggestion of mild enhancement on the follow up scan may simple relate to the recent hemorrhage and some residual inflammation. I do not think that the recent CT is diagnostic for a malignant lesion and this does not appear to represent an angiomyolipoma.  I recommended to Albert Stanley that we follow the lesion currently before deciding whether to pursue biopsy or ablation. I recommended another follow-up CT in late August, 3 months after the CT in May. This does not necessarily have to be performed with contrast. However, we will check with Dr. [name] to determine whether the patient should receive any iodinated contrast given that he was only fairly recently placed on hemodialysis. If it is preferred not to give him contrast, a follow-up unenhanced CT should suffice in determining if the lesion is stable, regressing or enlarging.  Thank you for this interesting  consult.  I greatly enjoyed  meeting Albert Stanley and look forward to participating in their care.  A copy of this report was sent to the requesting provider on this date.  Electronically SignedAletta Edouard T 04/23/2016, 1:54 PM   I spent a total of 40 Minutes in face to face in clinical consultation, greater than 50% of which was counseling/coordinating care for a right renal mass/lesion.

## 2016-04-24 DIAGNOSIS — M25552 Pain in left hip: Secondary | ICD-10-CM | POA: Diagnosis not present

## 2016-04-24 DIAGNOSIS — G894 Chronic pain syndrome: Secondary | ICD-10-CM | POA: Diagnosis not present

## 2016-04-24 DIAGNOSIS — M25512 Pain in left shoulder: Secondary | ICD-10-CM | POA: Diagnosis not present

## 2016-04-24 DIAGNOSIS — M25562 Pain in left knee: Secondary | ICD-10-CM | POA: Diagnosis not present

## 2016-04-24 DIAGNOSIS — Z79891 Long term (current) use of opiate analgesic: Secondary | ICD-10-CM | POA: Diagnosis not present

## 2016-04-24 DIAGNOSIS — M545 Low back pain: Secondary | ICD-10-CM | POA: Diagnosis not present

## 2016-04-25 DIAGNOSIS — D509 Iron deficiency anemia, unspecified: Secondary | ICD-10-CM | POA: Diagnosis not present

## 2016-04-25 DIAGNOSIS — E1129 Type 2 diabetes mellitus with other diabetic kidney complication: Secondary | ICD-10-CM | POA: Diagnosis not present

## 2016-04-25 DIAGNOSIS — Z23 Encounter for immunization: Secondary | ICD-10-CM | POA: Diagnosis not present

## 2016-04-25 DIAGNOSIS — D631 Anemia in chronic kidney disease: Secondary | ICD-10-CM | POA: Diagnosis not present

## 2016-04-25 DIAGNOSIS — N2581 Secondary hyperparathyroidism of renal origin: Secondary | ICD-10-CM | POA: Diagnosis not present

## 2016-04-25 DIAGNOSIS — N186 End stage renal disease: Secondary | ICD-10-CM | POA: Diagnosis not present

## 2016-04-27 DIAGNOSIS — I871 Compression of vein: Secondary | ICD-10-CM | POA: Diagnosis not present

## 2016-04-27 DIAGNOSIS — Z992 Dependence on renal dialysis: Secondary | ICD-10-CM | POA: Diagnosis not present

## 2016-04-27 DIAGNOSIS — T82858D Stenosis of vascular prosthetic devices, implants and grafts, subsequent encounter: Secondary | ICD-10-CM | POA: Diagnosis not present

## 2016-04-27 DIAGNOSIS — N186 End stage renal disease: Secondary | ICD-10-CM | POA: Diagnosis not present

## 2016-04-28 DIAGNOSIS — N2581 Secondary hyperparathyroidism of renal origin: Secondary | ICD-10-CM | POA: Diagnosis not present

## 2016-04-28 DIAGNOSIS — E1129 Type 2 diabetes mellitus with other diabetic kidney complication: Secondary | ICD-10-CM | POA: Diagnosis not present

## 2016-04-28 DIAGNOSIS — N186 End stage renal disease: Secondary | ICD-10-CM | POA: Diagnosis not present

## 2016-04-28 DIAGNOSIS — D509 Iron deficiency anemia, unspecified: Secondary | ICD-10-CM | POA: Diagnosis not present

## 2016-04-28 DIAGNOSIS — Z23 Encounter for immunization: Secondary | ICD-10-CM | POA: Diagnosis not present

## 2016-04-28 DIAGNOSIS — D631 Anemia in chronic kidney disease: Secondary | ICD-10-CM | POA: Diagnosis not present

## 2016-04-30 DIAGNOSIS — N186 End stage renal disease: Secondary | ICD-10-CM | POA: Diagnosis not present

## 2016-04-30 DIAGNOSIS — E1129 Type 2 diabetes mellitus with other diabetic kidney complication: Secondary | ICD-10-CM | POA: Diagnosis not present

## 2016-04-30 DIAGNOSIS — D509 Iron deficiency anemia, unspecified: Secondary | ICD-10-CM | POA: Diagnosis not present

## 2016-04-30 DIAGNOSIS — Z23 Encounter for immunization: Secondary | ICD-10-CM | POA: Diagnosis not present

## 2016-04-30 DIAGNOSIS — N2581 Secondary hyperparathyroidism of renal origin: Secondary | ICD-10-CM | POA: Diagnosis not present

## 2016-04-30 DIAGNOSIS — D631 Anemia in chronic kidney disease: Secondary | ICD-10-CM | POA: Diagnosis not present

## 2016-05-02 DIAGNOSIS — Z23 Encounter for immunization: Secondary | ICD-10-CM | POA: Diagnosis not present

## 2016-05-02 DIAGNOSIS — N2581 Secondary hyperparathyroidism of renal origin: Secondary | ICD-10-CM | POA: Diagnosis not present

## 2016-05-02 DIAGNOSIS — E1129 Type 2 diabetes mellitus with other diabetic kidney complication: Secondary | ICD-10-CM | POA: Diagnosis not present

## 2016-05-02 DIAGNOSIS — D509 Iron deficiency anemia, unspecified: Secondary | ICD-10-CM | POA: Diagnosis not present

## 2016-05-02 DIAGNOSIS — N186 End stage renal disease: Secondary | ICD-10-CM | POA: Diagnosis not present

## 2016-05-02 DIAGNOSIS — D631 Anemia in chronic kidney disease: Secondary | ICD-10-CM | POA: Diagnosis not present

## 2016-05-05 ENCOUNTER — Encounter (HOSPITAL_COMMUNITY): Payer: Medicare Other

## 2016-05-05 DIAGNOSIS — Z23 Encounter for immunization: Secondary | ICD-10-CM | POA: Diagnosis not present

## 2016-05-05 DIAGNOSIS — N2581 Secondary hyperparathyroidism of renal origin: Secondary | ICD-10-CM | POA: Diagnosis not present

## 2016-05-05 DIAGNOSIS — D631 Anemia in chronic kidney disease: Secondary | ICD-10-CM | POA: Diagnosis not present

## 2016-05-05 DIAGNOSIS — N186 End stage renal disease: Secondary | ICD-10-CM | POA: Diagnosis not present

## 2016-05-05 DIAGNOSIS — E1129 Type 2 diabetes mellitus with other diabetic kidney complication: Secondary | ICD-10-CM | POA: Diagnosis not present

## 2016-05-05 DIAGNOSIS — D509 Iron deficiency anemia, unspecified: Secondary | ICD-10-CM | POA: Diagnosis not present

## 2016-05-07 DIAGNOSIS — N186 End stage renal disease: Secondary | ICD-10-CM | POA: Diagnosis not present

## 2016-05-07 DIAGNOSIS — Z23 Encounter for immunization: Secondary | ICD-10-CM | POA: Diagnosis not present

## 2016-05-07 DIAGNOSIS — D509 Iron deficiency anemia, unspecified: Secondary | ICD-10-CM | POA: Diagnosis not present

## 2016-05-07 DIAGNOSIS — D631 Anemia in chronic kidney disease: Secondary | ICD-10-CM | POA: Diagnosis not present

## 2016-05-07 DIAGNOSIS — N2581 Secondary hyperparathyroidism of renal origin: Secondary | ICD-10-CM | POA: Diagnosis not present

## 2016-05-07 DIAGNOSIS — E1129 Type 2 diabetes mellitus with other diabetic kidney complication: Secondary | ICD-10-CM | POA: Diagnosis not present

## 2016-05-09 DIAGNOSIS — N2581 Secondary hyperparathyroidism of renal origin: Secondary | ICD-10-CM | POA: Diagnosis not present

## 2016-05-09 DIAGNOSIS — D631 Anemia in chronic kidney disease: Secondary | ICD-10-CM | POA: Diagnosis not present

## 2016-05-09 DIAGNOSIS — N186 End stage renal disease: Secondary | ICD-10-CM | POA: Diagnosis not present

## 2016-05-09 DIAGNOSIS — Z23 Encounter for immunization: Secondary | ICD-10-CM | POA: Diagnosis not present

## 2016-05-09 DIAGNOSIS — E1129 Type 2 diabetes mellitus with other diabetic kidney complication: Secondary | ICD-10-CM | POA: Diagnosis not present

## 2016-05-09 DIAGNOSIS — D509 Iron deficiency anemia, unspecified: Secondary | ICD-10-CM | POA: Diagnosis not present

## 2016-05-11 ENCOUNTER — Other Ambulatory Visit (HOSPITAL_COMMUNITY): Payer: Self-pay | Admitting: Cardiology

## 2016-05-11 DIAGNOSIS — Z992 Dependence on renal dialysis: Secondary | ICD-10-CM | POA: Diagnosis not present

## 2016-05-11 DIAGNOSIS — N186 End stage renal disease: Secondary | ICD-10-CM | POA: Diagnosis not present

## 2016-05-11 DIAGNOSIS — E1122 Type 2 diabetes mellitus with diabetic chronic kidney disease: Secondary | ICD-10-CM | POA: Diagnosis not present

## 2016-05-12 DIAGNOSIS — N186 End stage renal disease: Secondary | ICD-10-CM | POA: Diagnosis not present

## 2016-05-12 DIAGNOSIS — D509 Iron deficiency anemia, unspecified: Secondary | ICD-10-CM | POA: Diagnosis not present

## 2016-05-12 DIAGNOSIS — E1129 Type 2 diabetes mellitus with other diabetic kidney complication: Secondary | ICD-10-CM | POA: Diagnosis not present

## 2016-05-12 DIAGNOSIS — N2581 Secondary hyperparathyroidism of renal origin: Secondary | ICD-10-CM | POA: Diagnosis not present

## 2016-05-12 DIAGNOSIS — D631 Anemia in chronic kidney disease: Secondary | ICD-10-CM | POA: Diagnosis not present

## 2016-05-13 ENCOUNTER — Encounter (HOSPITAL_COMMUNITY): Payer: Self-pay

## 2016-05-13 ENCOUNTER — Other Ambulatory Visit (HOSPITAL_COMMUNITY): Payer: Self-pay | Admitting: Interventional Radiology

## 2016-05-13 ENCOUNTER — Ambulatory Visit (HOSPITAL_COMMUNITY)
Admission: RE | Admit: 2016-05-13 | Discharge: 2016-05-13 | Disposition: A | Discharge status: Home / Self Care | Payer: Medicare Other | Source: Ambulatory Visit | Attending: Cardiology | Admitting: Cardiology

## 2016-05-13 DIAGNOSIS — I120 Hypertensive chronic kidney disease with stage 5 chronic kidney disease or end stage renal disease: Secondary | ICD-10-CM | POA: Diagnosis not present

## 2016-05-13 DIAGNOSIS — I5022 Chronic systolic (congestive) heart failure: Secondary | ICD-10-CM

## 2016-05-13 DIAGNOSIS — Z6830 Body mass index (BMI) 30.0-30.9, adult: Secondary | ICD-10-CM | POA: Diagnosis not present

## 2016-05-13 DIAGNOSIS — E1122 Type 2 diabetes mellitus with diabetic chronic kidney disease: Secondary | ICD-10-CM | POA: Insufficient documentation

## 2016-05-13 DIAGNOSIS — I428 Other cardiomyopathies: Secondary | ICD-10-CM | POA: Insufficient documentation

## 2016-05-13 DIAGNOSIS — Z86718 Personal history of other venous thrombosis and embolism: Secondary | ICD-10-CM | POA: Diagnosis not present

## 2016-05-13 DIAGNOSIS — N186 End stage renal disease: Secondary | ICD-10-CM | POA: Insufficient documentation

## 2016-05-13 DIAGNOSIS — I509 Heart failure, unspecified: Secondary | ICD-10-CM | POA: Diagnosis present

## 2016-05-13 DIAGNOSIS — I2510 Atherosclerotic heart disease of native coronary artery without angina pectoris: Secondary | ICD-10-CM | POA: Insufficient documentation

## 2016-05-13 DIAGNOSIS — Z79899 Other long term (current) drug therapy: Secondary | ICD-10-CM | POA: Diagnosis not present

## 2016-05-13 DIAGNOSIS — Z992 Dependence on renal dialysis: Secondary | ICD-10-CM | POA: Diagnosis not present

## 2016-05-13 DIAGNOSIS — N289 Disorder of kidney and ureter, unspecified: Secondary | ICD-10-CM

## 2016-05-13 DIAGNOSIS — M109 Gout, unspecified: Secondary | ICD-10-CM | POA: Diagnosis not present

## 2016-05-13 DIAGNOSIS — Z87891 Personal history of nicotine dependence: Secondary | ICD-10-CM | POA: Insufficient documentation

## 2016-05-13 DIAGNOSIS — E785 Hyperlipidemia, unspecified: Secondary | ICD-10-CM | POA: Diagnosis not present

## 2016-05-13 DIAGNOSIS — Z7984 Long term (current) use of oral hypoglycemic drugs: Secondary | ICD-10-CM | POA: Insufficient documentation

## 2016-05-13 DIAGNOSIS — E669 Obesity, unspecified: Secondary | ICD-10-CM | POA: Diagnosis not present

## 2016-05-13 MED ORDER — CARVEDILOL 3.125 MG PO TABS: ORAL_TABLET | ORAL | 3 refills | Status: DC

## 2016-05-13 NOTE — Patient Instructions (Signed)
Take Carvedilol 3.125mg  twice daily on non-dialysis days.  Follow up with Dr.McLean in 6 months.

## 2016-05-14 DIAGNOSIS — N2581 Secondary hyperparathyroidism of renal origin: Secondary | ICD-10-CM | POA: Diagnosis not present

## 2016-05-14 DIAGNOSIS — N186 End stage renal disease: Secondary | ICD-10-CM | POA: Diagnosis not present

## 2016-05-14 DIAGNOSIS — D631 Anemia in chronic kidney disease: Secondary | ICD-10-CM | POA: Diagnosis not present

## 2016-05-14 DIAGNOSIS — D509 Iron deficiency anemia, unspecified: Secondary | ICD-10-CM | POA: Diagnosis not present

## 2016-05-14 DIAGNOSIS — E1129 Type 2 diabetes mellitus with other diabetic kidney complication: Secondary | ICD-10-CM | POA: Diagnosis not present

## 2016-05-14 NOTE — Progress Notes (Signed)
PCP: Dr. Delfina Redwood Cardiology: [name]  70 yo with history of HTN, DM, and ESRD presents for cardiology followup.  In 2/17, he was admitted with a right renal subcapsular hematoma.  He had been on warfarin for history of DVT, this was stopped.  Echo during that admission showed EF 35-40% with wall motion abnormalities concerning for CAD.  Cardiac cath was not done as creatinine was high and he was not yet on dialysis. Since that time, he has started HD.  He had a Cardiolite in 4/17 showing a large fixed inferior defect with EF 39%, intermediate risk study.  LHC was done in 5/17, showing no obstructive CAD and EF about 40%.    He is generally doing well.  Says he is not having any problems with dialysis.  BP is borderline elevated today.  No dyspnea except with heavy exertion.  I had asked him to start Coreg 3.125 mg bid on non-dialysis days (OK to take on dialysis days also if his BP is not significantly affected by it during HD).  His nephrologist stopped it, he is not sure why. Weight is stable.   ECG: NSR, PVCs, LVH, LAFB  Labs (3/17): HCT 24.3  Past Medical History: 1. CAD: [place] 2002 showed mild nonobstructive disease with 25% stenosis in LAD.  - Echo (2/17) with EF 35-40%, inferolateral and inferoseptal hypokinesis.  - Cardiolite (4/17) with EF 39%, large inferior/inferolateral primarily fixed defect, intermediate risk study.  - LHC (5/17) with nonobstructive CAD, EF 40%.  2. DM2 3. Obesity 4. ESRD: TTS HD.  5. HTN 6. Gout 7. Low back pain 8. 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 Anemia of renal disease.  10. H/o DVT 11. Right renal subcapsular hematoma in 2/17.  12. Hyperlipidemia.  13. Nonischemic cardiomyopathy: EF 35-40% by echo in 2/17.   Family History: Father died from "heart complications" at 78  Social History: Nonsmoker (quit 1998). Retired Scientist, physiological at [employer]. Lives with wife and daughter in [place].   ROS: All systems reviewed and negative  except as per HPI.   Current Outpatient Prescriptions  Medication Sig Dispense Refill  . allopurinol (ZYLOPRIM) 300 MG tablet Take 300 mg by mouth daily.    Marland Kitchen atorvastatin (LIPITOR) 40 MG tablet Take 40 mg by mouth every evening.  0  . BAYER BREEZE 2 TEST DISK Apply 1 each topically daily as needed (FOR TESTING BLOOD SUGAR).   0  . calcitRIOL (ROCALTROL) 0.5 MCG capsule Take 0.5 mcg by mouth 3 (three) times a week. Medication given at dialysis on Tues Thurs Sat  1  . cyclobenzaprine (FLEXERIL) 5 MG tablet Take 5 mg by mouth 3 (three) times daily as needed for muscle spasms.    . famotidine (PEPCID) 20 MG tablet Take 1 tablet (20 mg total) by mouth 2 (two) times daily as needed for heartburn.    . fluticasone (FLONASE) 50 MCG/ACT nasal spray Place 1 spray into both nostrils daily as needed for allergies or rhinitis.    Marland Kitchen glipiZIDE (GLUCOTROL) 5 MG tablet Take 1 tablet (5 mg total) by mouth daily before breakfast. 30 tablet 0  . lidocaine-prilocaine (EMLA) cream APPLY TO SKIN AS NEED FOR  PREDIALYSIS  0  . Loratadine-Pseudoephedrine (CLARITIN-D 24 HOUR PO) Take 1 tablet by mouth daily. PATIENT NOT SURE OF THE DOSAGE    . multivitamin (RENA-VIT) TABS tablet Take 1 tablet by mouth at bedtime.   0  . oxyCODONE-acetaminophen (PERCOCET) 7.5-325 MG tablet Take 1 tablet by mouth every  4 (four) hours as needed for severe pain.    . sevelamer carbonate (RENVELA) 800 MG tablet Take 2 tablets (1,600 mg total) by mouth 3 (three) times daily with meals. 90 tablet 0  . carvedilol (COREG) 3.125 MG tablet Take 3.125mg  by mouth twice daily on non-dialysis days. 60 tablet 3   No current facility-administered medications for this encounter.    BP (!) 142/80   Pulse 88   Wt 220 lb 8 oz (100 kg)   SpO2 100%   BMI 30.75 kg/m  General: NAD Neck: JVP 8-9 cm, no thyromegaly or thyroid nodule.  Lungs: Clear to auscultation bilaterally with normal respiratory effort. CV: Nondisplaced PMI.  Heart regular S1/S2, no  S3/S4, no murmur.  1+ ankle edema.  No carotid bruit.  Normal pedal pulses.  Abdomen: Soft, nontender, no hepatosplenomegaly, no distention.  Skin: Intact without lesions or rashes.  Neurologic: Alert and oriented x 3.  Psych: Normal affect. Extremities: No clubbing or cyanosis.  HEENT: Normal.   Assessment/Plan: 1. CAD: No significant disease on 5/17 LHC. 2. Cardiomyopathy: Nonischemic cardiomyopathy cardiomyopathy.  EF 35-40% by echo and 39% by Cardiolite. He has ESRD with mild volume overload on exam today.  - I would like him to take Coreg 3.125 mg bid, at least on non-HD days (and on HD days if BP tolerates).  Though there is not much data in ESRD patients, there is a randomized trial from 2003 in [place] suggesting survival benefit for treatment of patients with heart failure and reduced EF with carvedilol.   Loralie Champagne 05/14/2016

## 2016-05-16 DIAGNOSIS — D631 Anemia in chronic kidney disease: Secondary | ICD-10-CM | POA: Diagnosis not present

## 2016-05-16 DIAGNOSIS — N186 End stage renal disease: Secondary | ICD-10-CM | POA: Diagnosis not present

## 2016-05-16 DIAGNOSIS — E1129 Type 2 diabetes mellitus with other diabetic kidney complication: Secondary | ICD-10-CM | POA: Diagnosis not present

## 2016-05-16 DIAGNOSIS — N2581 Secondary hyperparathyroidism of renal origin: Secondary | ICD-10-CM | POA: Diagnosis not present

## 2016-05-16 DIAGNOSIS — D509 Iron deficiency anemia, unspecified: Secondary | ICD-10-CM | POA: Diagnosis not present

## 2016-05-19 ENCOUNTER — Other Ambulatory Visit: Payer: Self-pay | Admitting: Surgery

## 2016-05-19 DIAGNOSIS — N2581 Secondary hyperparathyroidism of renal origin: Secondary | ICD-10-CM | POA: Diagnosis not present

## 2016-05-19 DIAGNOSIS — T82510D Breakdown (mechanical) of surgically created arteriovenous fistula, subsequent encounter: Secondary | ICD-10-CM

## 2016-05-19 DIAGNOSIS — E1129 Type 2 diabetes mellitus with other diabetic kidney complication: Secondary | ICD-10-CM | POA: Diagnosis not present

## 2016-05-19 DIAGNOSIS — D631 Anemia in chronic kidney disease: Secondary | ICD-10-CM | POA: Diagnosis not present

## 2016-05-19 DIAGNOSIS — D509 Iron deficiency anemia, unspecified: Secondary | ICD-10-CM | POA: Diagnosis not present

## 2016-05-19 DIAGNOSIS — N186 End stage renal disease: Secondary | ICD-10-CM | POA: Diagnosis not present

## 2016-05-20 DIAGNOSIS — Z79891 Long term (current) use of opiate analgesic: Secondary | ICD-10-CM | POA: Diagnosis not present

## 2016-05-20 DIAGNOSIS — G894 Chronic pain syndrome: Secondary | ICD-10-CM | POA: Diagnosis not present

## 2016-05-20 DIAGNOSIS — M25552 Pain in left hip: Secondary | ICD-10-CM | POA: Diagnosis not present

## 2016-05-20 DIAGNOSIS — M25512 Pain in left shoulder: Secondary | ICD-10-CM | POA: Diagnosis not present

## 2016-05-20 DIAGNOSIS — M25562 Pain in left knee: Secondary | ICD-10-CM | POA: Diagnosis not present

## 2016-05-20 DIAGNOSIS — M545 Low back pain: Secondary | ICD-10-CM | POA: Diagnosis not present

## 2016-05-21 DIAGNOSIS — D631 Anemia in chronic kidney disease: Secondary | ICD-10-CM | POA: Diagnosis not present

## 2016-05-21 DIAGNOSIS — D509 Iron deficiency anemia, unspecified: Secondary | ICD-10-CM | POA: Diagnosis not present

## 2016-05-21 DIAGNOSIS — N2581 Secondary hyperparathyroidism of renal origin: Secondary | ICD-10-CM | POA: Diagnosis not present

## 2016-05-21 DIAGNOSIS — E1129 Type 2 diabetes mellitus with other diabetic kidney complication: Secondary | ICD-10-CM | POA: Diagnosis not present

## 2016-05-21 DIAGNOSIS — N186 End stage renal disease: Secondary | ICD-10-CM | POA: Diagnosis not present

## 2016-05-23 DIAGNOSIS — D631 Anemia in chronic kidney disease: Secondary | ICD-10-CM | POA: Diagnosis not present

## 2016-05-23 DIAGNOSIS — D509 Iron deficiency anemia, unspecified: Secondary | ICD-10-CM | POA: Diagnosis not present

## 2016-05-23 DIAGNOSIS — N2581 Secondary hyperparathyroidism of renal origin: Secondary | ICD-10-CM | POA: Diagnosis not present

## 2016-05-23 DIAGNOSIS — N186 End stage renal disease: Secondary | ICD-10-CM | POA: Diagnosis not present

## 2016-05-23 DIAGNOSIS — E1129 Type 2 diabetes mellitus with other diabetic kidney complication: Secondary | ICD-10-CM | POA: Diagnosis not present

## 2016-05-26 DIAGNOSIS — N186 End stage renal disease: Secondary | ICD-10-CM | POA: Diagnosis not present

## 2016-05-26 DIAGNOSIS — D509 Iron deficiency anemia, unspecified: Secondary | ICD-10-CM | POA: Diagnosis not present

## 2016-05-26 DIAGNOSIS — E1129 Type 2 diabetes mellitus with other diabetic kidney complication: Secondary | ICD-10-CM | POA: Diagnosis not present

## 2016-05-26 DIAGNOSIS — D631 Anemia in chronic kidney disease: Secondary | ICD-10-CM | POA: Diagnosis not present

## 2016-05-26 DIAGNOSIS — N2581 Secondary hyperparathyroidism of renal origin: Secondary | ICD-10-CM | POA: Diagnosis not present

## 2016-05-28 DIAGNOSIS — E1129 Type 2 diabetes mellitus with other diabetic kidney complication: Secondary | ICD-10-CM | POA: Diagnosis not present

## 2016-05-28 DIAGNOSIS — N186 End stage renal disease: Secondary | ICD-10-CM | POA: Diagnosis not present

## 2016-05-28 DIAGNOSIS — D631 Anemia in chronic kidney disease: Secondary | ICD-10-CM | POA: Diagnosis not present

## 2016-05-28 DIAGNOSIS — D509 Iron deficiency anemia, unspecified: Secondary | ICD-10-CM | POA: Diagnosis not present

## 2016-05-28 DIAGNOSIS — N2581 Secondary hyperparathyroidism of renal origin: Secondary | ICD-10-CM | POA: Diagnosis not present

## 2016-05-30 DIAGNOSIS — D631 Anemia in chronic kidney disease: Secondary | ICD-10-CM | POA: Diagnosis not present

## 2016-05-30 DIAGNOSIS — E1129 Type 2 diabetes mellitus with other diabetic kidney complication: Secondary | ICD-10-CM | POA: Diagnosis not present

## 2016-05-30 DIAGNOSIS — N2581 Secondary hyperparathyroidism of renal origin: Secondary | ICD-10-CM | POA: Diagnosis not present

## 2016-05-30 DIAGNOSIS — D509 Iron deficiency anemia, unspecified: Secondary | ICD-10-CM | POA: Diagnosis not present

## 2016-05-30 DIAGNOSIS — N186 End stage renal disease: Secondary | ICD-10-CM | POA: Diagnosis not present

## 2016-06-01 ENCOUNTER — Encounter: Payer: Self-pay | Admitting: Surgery

## 2016-06-02 DIAGNOSIS — N2581 Secondary hyperparathyroidism of renal origin: Secondary | ICD-10-CM | POA: Diagnosis not present

## 2016-06-02 DIAGNOSIS — D631 Anemia in chronic kidney disease: Secondary | ICD-10-CM | POA: Diagnosis not present

## 2016-06-02 DIAGNOSIS — E1129 Type 2 diabetes mellitus with other diabetic kidney complication: Secondary | ICD-10-CM | POA: Diagnosis not present

## 2016-06-02 DIAGNOSIS — N186 End stage renal disease: Secondary | ICD-10-CM | POA: Diagnosis not present

## 2016-06-02 DIAGNOSIS — D509 Iron deficiency anemia, unspecified: Secondary | ICD-10-CM | POA: Diagnosis not present

## 2016-06-03 ENCOUNTER — Encounter: Payer: Self-pay | Admitting: Surgery

## 2016-06-03 ENCOUNTER — Other Ambulatory Visit: Payer: Self-pay | Admitting: *Deleted

## 2016-06-03 ENCOUNTER — Encounter: Payer: Self-pay | Admitting: *Deleted

## 2016-06-03 ENCOUNTER — Ambulatory Visit (HOSPITAL_COMMUNITY)
Admission: RE | Admit: 2016-06-03 | Discharge: 2016-06-03 | Disposition: A | Discharge status: Home / Self Care | Payer: Medicare Other | Source: Ambulatory Visit | Attending: Surgery | Admitting: Surgery

## 2016-06-03 ENCOUNTER — Ambulatory Visit (INDEPENDENT_AMBULATORY_CARE_PROVIDER_SITE_OTHER): Payer: Medicare Other | Admitting: Surgery

## 2016-06-03 VITALS — BP 143/78 | HR 82 | Temp 99.2°F | Resp 18 | Ht 68.0 in | Wt 219.0 lb

## 2016-06-03 DIAGNOSIS — Z992 Dependence on renal dialysis: Secondary | ICD-10-CM | POA: Diagnosis not present

## 2016-06-03 DIAGNOSIS — X58XXXD Exposure to other specified factors, subsequent encounter: Secondary | ICD-10-CM | POA: Diagnosis not present

## 2016-06-03 DIAGNOSIS — T82510D Breakdown (mechanical) of surgically created arteriovenous fistula, subsequent encounter: Secondary | ICD-10-CM | POA: Diagnosis not present

## 2016-06-03 DIAGNOSIS — I2510 Atherosclerotic heart disease of native coronary artery without angina pectoris: Secondary | ICD-10-CM

## 2016-06-03 DIAGNOSIS — N186 End stage renal disease: Secondary | ICD-10-CM | POA: Diagnosis not present

## 2016-06-03 NOTE — Progress Notes (Signed)
Vitals:   06/03/16 1603  BP: (!) 146/79  Pulse: 79  Resp: 18  Temp: 99.2 F (37.3 C)  SpO2: 100%  Weight: 219 lb (99.3 kg)  Height: 5\' 8"  (1.727 m)

## 2016-06-03 NOTE — Progress Notes (Signed)
This is a 70 y/o male with ESRD who had a fistula creation by Dr. Scot Dock on 12/13/2015.  He has started to use the fistula since May 2017.  He states it works sometimes and sometimes it doesn't.  His nephrologist Dr. [name] sent him here to evaluate malfunctioning av fistula.  He still has a working right IJ tunneled catheter.  Past medical history includes: HTN managed withCarvedilol  , DM and hyper cholesterolemia managed with Lipitor.   Past Medical History:  Diagnosis Date  . Asthma   . Bladder cancer ([place]) dx'd 1990   surg only  . Diabetes mellitus without complication ([place])   . ESRD (end stage renal disease) ([name])    Tues, Thurs, Sat dialysis  . History of cardiac catheterization    a. LHC 5/17: no obstructive CAD  . History of DVT (deep vein thrombosis)   . History of nuclear stress test    a. Myoview 4/17: EF 39%, inf, inf-lat, apical inf, apical lat, apical scar, intermediate risk  . Hyperlipidemia   . Hypertension   . NICM (nonischemic cardiomyopathy) ([place])    a. Echo 2/17: mod LVH, EF 35-40%, inf-lat and inf-sept HK, mod MR, severe LAE, small pericardial effusion    Past Surgical History:  Procedure Laterality Date  . arthroscopic knee surgery    . AV FISTULA PLACEMENT Left 12/13/2015   Procedure: ARTERIOVENOUS (AV) FISTULA CREATION;  Surgeon: Angelia Mould, MD;  Location: [facility];  Service: Vascular;  Laterality: Left;  . BACK SURGERY     2 times  . CARDIAC CATHETERIZATION N/A 02/28/2016   Procedure: Left Heart Cath and Coronary Angiography;  Surgeon: Larey Dresser, MD;  Location: [facility];  Service: Cardiovascular;  Laterality: N/A;  . CYSTOSCOPY    . INSERTION OF DIALYSIS CATHETER N/A 12/13/2015   Procedure: INSERTION OF DIALYSIS CATHETER;  Surgeon: Angelia Mould, MD;  Location: [facility] OR;  Service: Vascular;  Laterality: N/A;  . JOINT REPLACEMENT     hip     Social History Social History  Substance Use Topics  . Smoking status:  Former Research scientist (life sciences)  . Smokeless tobacco: Never Used  . Alcohol use No     Comment: Used to drink on the weekends, quit 2 years ago    Family History Family History  Problem Relation Age of Onset  . Arthritis Mother   . Cancer Father   . Heart disease Father   . Hypertension Sister   . Alcohol abuse Brother   . Diabetes Daughter   . Heart attack Paternal Uncle     Allergies  Allergies  Allergen Reactions  . Eggs Or Egg-Derived Products Other (See Comments)    Other reaction(s): Unknown Pt can have egg whites - not the yolk  . Foltx [Elfolate Plus] Other (See Comments)    Pt unsure of this  . Lac Bovis     Other reaction(s): Unknown  . Meperidine Other (See Comments)    Demerol  . Meperidine Hcl     Unknown  . Milk-Related Compounds     LACTOSE INTOLARENCE  . Penicillin G Other (See Comments)  . Penicillins     "Knocks me out."   Has patient had a PCN reaction causing immediate rash, facial/tongue/throat swelling, SOB or lightheadedness with hypotension: yes- blacked out Has patient had a PCN reaction causing severe rash involving mucus membranes or skin necrosis: unknown Has patient had a PCN reaction that required hospitalization: at MD office Has patient had  a PCN reaction occurring within the last 10 years: no If all of the above answers are "NO", then may proceed with Cephalosporin use.   . Povidone Iodine Other (See Comments)    unknown  . Sulfa Antibiotics Other (See Comments)    unknown  . Sulfonamide Derivatives     Unknown.   . Povidone-Iodine Rash     Current Outpatient Prescriptions  Medication Sig Dispense Refill  . allopurinol (ZYLOPRIM) 300 MG tablet Take 300 mg by mouth daily.    Marland Kitchen atorvastatin (LIPITOR) 40 MG tablet Take 40 mg by mouth every evening.  0  . BAYER BREEZE 2 TEST DISK Apply 1 each topically daily as needed (FOR TESTING BLOOD SUGAR).   0  . calcitRIOL (ROCALTROL) 0.5 MCG capsule Take 0.5 mcg by mouth 3 (three) times a week. Medication  given at dialysis on Tues Thurs Sat  1  . carvedilol (COREG) 3.125 MG tablet Take 3.125mg  by mouth twice daily on non-dialysis days. 60 tablet 3  . cyclobenzaprine (FLEXERIL) 5 MG tablet Take 5 mg by mouth 3 (three) times daily as needed for muscle spasms.    . famotidine (PEPCID) 20 MG tablet Take 1 tablet (20 mg total) by mouth 2 (two) times daily as needed for heartburn.    . fluticasone (FLONASE) 50 MCG/ACT nasal spray Place 1 spray into both nostrils daily as needed for allergies or rhinitis.    Marland Kitchen glipiZIDE (GLUCOTROL) 5 MG tablet Take 1 tablet (5 mg total) by mouth daily before breakfast. 30 tablet 0  . lidocaine-prilocaine (EMLA) cream APPLY TO SKIN AS NEED FOR  PREDIALYSIS  0  . Loratadine-Pseudoephedrine (CLARITIN-D 24 HOUR PO) Take 1 tablet by mouth daily. PATIENT NOT SURE OF THE DOSAGE    . multivitamin (RENA-VIT) TABS tablet Take 1 tablet by mouth at bedtime.   0  . oxyCODONE-acetaminophen (PERCOCET) 7.5-325 MG tablet Take 1 tablet by mouth every 4 (four) hours as needed for severe pain.    . SENSIPAR 30 MG tablet     . sevelamer carbonate (RENVELA) 800 MG tablet Take 2 tablets (1,600 mg total) by mouth 3 (three) times daily with meals. 90 tablet 0   No current facility-administered medications for this visit.     ROS:   General:  No weight loss, Fever, chills  HEENT: No recent headaches, no nasal bleeding, no visual changes, no sore throat  Neurologic: No dizziness, blackouts, seizures. No recent symptoms of stroke or mini- stroke. No recent episodes of slurred speech, or temporary blindness.  Cardiac: No recent episodes of chest pain/pressure, no shortness of breath at rest.  No shortness of breath with exertion.  Denies history of atrial fibrillation or irregular heartbeat  Vascular: No history of rest pain in feet.  No history of claudication.  No history of non-healing ulcer, No history of DVT   Pulmonary: No home oxygen, no productive cough, no hemoptysis,  No asthma or  wheezing  Musculoskeletal:  [ ]  Arthritis, [ ]  Low back pain,  [ ]  Joint pain  Hematologic:No history of hypercoagulable state.  No history of easy bleeding.  No history of anemia  Gastrointestinal: No hematochezia or melena,  No gastroesophageal reflux, no trouble swallowing  Urinary: [x chronic Kidney disease, [ ]  on HD - [ ]  MWF or [ x] TTHS, [ ]  Burning with urination, [ ]  Frequent urination, [ ]  Difficulty urinating;   Skin: No rashes  Psychological: No history of anxiety,  No history of depression   Physical  Examination  Vitals:   06/03/16 1603 06/03/16 1609  BP: (!) 146/79 (!) 143/78  Pulse: 79 82  Resp: 18   Temp: 99.2 F (37.3 C)   SpO2: 100%   Weight: 219 lb (99.3 kg)   Height: 5\' 8"  (1.727 m)     Body mass index is 33.3 kg/m.  General:  Alert and oriented, no acute distress HEENT: Normal Neck: No bruit or JVD Pulmonary: Clear to auscultation bilaterally Cardiac: Regular Rate and Rhythm without murmur Gastrointestinal: Soft, non-tender, non-distended, no mass, no scars Skin: No rash Extremity Pulses:  2+ radial, brachial pulses bilaterally Palpable thrill distal half of the fistula. Musculoskeletal: No deformity or edema  Neurologic: Upper and lower extremity motor 5/5 and symmetric  DATA:  Competing branches left av fistula, Depth is 0.56-0.71 proximally with narrowing above the anastomosis.   ASSESSMENT:  Malfunctioning left BC  AV fistula   PLAN: We will plan on a fistulogram with possible balloon angioplasty Tuesday 29 2017 by Dr. Trula Slade.  He may need ligation of competing branches as well pending the out come.    COLLINS, EMMA MAUREEN PA-C [facility]  The patient was seen in conjunction with Dr. Trula Slade today.  I agree with the above.  I have seen and evaluated the patient.  He had a left brachiocephalic fistula created by Dr. Scot Dock which is being difficult to use.  His ultrasound today suggested several  potential problems.  The first is that there is a significant stenosis in the distal brachium.  The second is multiple relatively large branches.  The third is the depth of the fistula.  I proposed several potential options.  The first of which will be proceeding with a fistulogram on Tuesday, August 29.  I'll plan on cannulation near the arterial venous anastomosis and imaging his entire fistula and intervening on the stenosis if needed.  If this does not soft his problems, the next step would be to proceed with elevation of his fistula and branch ligation.  His fistulogram will be performed first and determination of the next step would made after these results  Annamarie Major

## 2016-06-04 DIAGNOSIS — N2581 Secondary hyperparathyroidism of renal origin: Secondary | ICD-10-CM | POA: Diagnosis not present

## 2016-06-04 DIAGNOSIS — N186 End stage renal disease: Secondary | ICD-10-CM | POA: Diagnosis not present

## 2016-06-04 DIAGNOSIS — D631 Anemia in chronic kidney disease: Secondary | ICD-10-CM | POA: Diagnosis not present

## 2016-06-04 DIAGNOSIS — E1129 Type 2 diabetes mellitus with other diabetic kidney complication: Secondary | ICD-10-CM | POA: Diagnosis not present

## 2016-06-04 DIAGNOSIS — D509 Iron deficiency anemia, unspecified: Secondary | ICD-10-CM | POA: Diagnosis not present

## 2016-06-06 DIAGNOSIS — E1129 Type 2 diabetes mellitus with other diabetic kidney complication: Secondary | ICD-10-CM | POA: Diagnosis not present

## 2016-06-06 DIAGNOSIS — D631 Anemia in chronic kidney disease: Secondary | ICD-10-CM | POA: Diagnosis not present

## 2016-06-06 DIAGNOSIS — D509 Iron deficiency anemia, unspecified: Secondary | ICD-10-CM | POA: Diagnosis not present

## 2016-06-06 DIAGNOSIS — N2581 Secondary hyperparathyroidism of renal origin: Secondary | ICD-10-CM | POA: Diagnosis not present

## 2016-06-06 DIAGNOSIS — N186 End stage renal disease: Secondary | ICD-10-CM | POA: Diagnosis not present

## 2016-06-08 ENCOUNTER — Ambulatory Visit (HOSPITAL_COMMUNITY)
Admission: RE | Admit: 2016-06-08 | Discharge: 2016-06-08 | Disposition: A | Discharge status: Home / Self Care | Payer: Medicare Other | Source: Ambulatory Visit | Attending: Interventional Radiology | Admitting: Interventional Radiology

## 2016-06-08 DIAGNOSIS — N281 Cyst of kidney, acquired: Secondary | ICD-10-CM | POA: Diagnosis not present

## 2016-06-08 DIAGNOSIS — N289 Disorder of kidney and ureter, unspecified: Secondary | ICD-10-CM | POA: Diagnosis present

## 2016-06-08 DIAGNOSIS — R918 Other nonspecific abnormal finding of lung field: Secondary | ICD-10-CM | POA: Insufficient documentation

## 2016-06-08 DIAGNOSIS — I517 Cardiomegaly: Secondary | ICD-10-CM | POA: Insufficient documentation

## 2016-06-09 ENCOUNTER — Ambulatory Visit (HOSPITAL_COMMUNITY)
Admission: RE | Admit: 2016-06-09 | Discharge: 2016-06-09 | Disposition: A | Discharge status: Home / Self Care | Payer: Medicare Other | Source: Ambulatory Visit | Attending: Surgery | Admitting: Surgery

## 2016-06-09 ENCOUNTER — Other Ambulatory Visit (HOSPITAL_COMMUNITY): Payer: Medicare Other

## 2016-06-09 ENCOUNTER — Encounter (HOSPITAL_COMMUNITY)
Admission: RE | Disposition: A | Discharge status: Home / Self Care | Payer: Self-pay | Source: Ambulatory Visit | Attending: Surgery

## 2016-06-09 ENCOUNTER — Encounter (HOSPITAL_COMMUNITY): Payer: Self-pay | Admitting: *Deleted

## 2016-06-09 ENCOUNTER — Other Ambulatory Visit: Payer: Medicare Other

## 2016-06-09 DIAGNOSIS — Z79899 Other long term (current) drug therapy: Secondary | ICD-10-CM | POA: Diagnosis not present

## 2016-06-09 DIAGNOSIS — I429 Cardiomyopathy, unspecified: Secondary | ICD-10-CM | POA: Insufficient documentation

## 2016-06-09 DIAGNOSIS — E785 Hyperlipidemia, unspecified: Secondary | ICD-10-CM | POA: Diagnosis not present

## 2016-06-09 DIAGNOSIS — Z87891 Personal history of nicotine dependence: Secondary | ICD-10-CM | POA: Insufficient documentation

## 2016-06-09 DIAGNOSIS — Z992 Dependence on renal dialysis: Secondary | ICD-10-CM | POA: Insufficient documentation

## 2016-06-09 DIAGNOSIS — I120 Hypertensive chronic kidney disease with stage 5 chronic kidney disease or end stage renal disease: Secondary | ICD-10-CM | POA: Insufficient documentation

## 2016-06-09 DIAGNOSIS — Z86718 Personal history of other venous thrombosis and embolism: Secondary | ICD-10-CM | POA: Insufficient documentation

## 2016-06-09 DIAGNOSIS — Z8551 Personal history of malignant neoplasm of bladder: Secondary | ICD-10-CM | POA: Insufficient documentation

## 2016-06-09 DIAGNOSIS — E1122 Type 2 diabetes mellitus with diabetic chronic kidney disease: Secondary | ICD-10-CM | POA: Diagnosis not present

## 2016-06-09 DIAGNOSIS — N186 End stage renal disease: Secondary | ICD-10-CM | POA: Insufficient documentation

## 2016-06-09 DIAGNOSIS — Z880 Allergy status to penicillin: Secondary | ICD-10-CM | POA: Diagnosis not present

## 2016-06-09 DIAGNOSIS — T82590A Other mechanical complication of surgically created arteriovenous fistula, initial encounter: Secondary | ICD-10-CM | POA: Insufficient documentation

## 2016-06-09 DIAGNOSIS — Y832 Surgical operation with anastomosis, bypass or graft as the cause of abnormal reaction of the patient, or of later complication, without mention of misadventure at the time of the procedure: Secondary | ICD-10-CM | POA: Insufficient documentation

## 2016-06-09 DIAGNOSIS — J45909 Unspecified asthma, uncomplicated: Secondary | ICD-10-CM | POA: Insufficient documentation

## 2016-06-09 DIAGNOSIS — T82898A Other specified complication of vascular prosthetic devices, implants and grafts, initial encounter: Secondary | ICD-10-CM | POA: Diagnosis not present

## 2016-06-09 DIAGNOSIS — T82318A Breakdown (mechanical) of other vascular grafts, initial encounter: Secondary | ICD-10-CM | POA: Diagnosis present

## 2016-06-09 DIAGNOSIS — Z96649 Presence of unspecified artificial hip joint: Secondary | ICD-10-CM | POA: Insufficient documentation

## 2016-06-09 HISTORY — PX: PERIPHERAL VASCULAR CATHETERIZATION: SHX172C

## 2016-06-09 LAB — POCT I-STAT, CHEM 8
BUN: 67 mg/dL — AB (ref 6–20)
CALCIUM ION: 1.16 mmol/L (ref 1.15–1.40)
CHLORIDE: 101 mmol/L (ref 101–111)
CREATININE: 7.2 mg/dL — AB (ref 0.61–1.24)
GLUCOSE: 100 mg/dL — AB (ref 65–99)
HCT: 35 % — ABNORMAL LOW (ref 39.0–52.0)
Hemoglobin: 11.9 g/dL — ABNORMAL LOW (ref 13.0–17.0)
POTASSIUM: 4 mmol/L (ref 3.5–5.1)
Sodium: 140 mmol/L (ref 135–145)
TCO2: 25 mmol/L (ref 0–100)

## 2016-06-09 SURGERY — A/V SHUNTOGRAM/FISTULAGRAM
Laterality: Left

## 2016-06-09 MED ORDER — OXYCODONE HCL 5 MG PO TABS
ORAL_TABLET | ORAL | Status: AC
  Filled 2016-06-09: qty 1

## 2016-06-09 MED ORDER — OXYCODONE HCL 5 MG PO TABS
5.0000 mg | ORAL_TABLET | ORAL | Status: DC | PRN
  Administered 2016-06-09: 5 mg via ORAL
  Filled 2016-06-09: qty 2

## 2016-06-09 MED ORDER — HYDRALAZINE HCL 20 MG/ML IJ SOLN: 5.0000 mg | INTRAMUSCULAR | Status: DC | PRN

## 2016-06-09 MED ORDER — IODIXANOL 320 MG/ML IV SOLN
INTRAVENOUS | Status: DC | PRN
  Administered 2016-06-09: 75 mL via INTRAVENOUS

## 2016-06-09 MED ORDER — SODIUM CHLORIDE 0.9 % IV SOLN: 250.0000 mL | INTRAVENOUS | Status: DC | PRN

## 2016-06-09 MED ORDER — SODIUM CHLORIDE 0.9% FLUSH: 3.0000 mL | Freq: Two times a day (BID) | INTRAVENOUS | Status: DC

## 2016-06-09 MED ORDER — LABETALOL HCL 5 MG/ML IV SOLN: 10.0000 mg | INTRAVENOUS | Status: DC | PRN

## 2016-06-09 MED ORDER — SODIUM CHLORIDE 0.9% FLUSH: 3.0000 mL | INTRAVENOUS | Status: DC | PRN

## 2016-06-09 MED ORDER — HEPARIN (PORCINE) IN NACL 2-0.9 UNIT/ML-% IJ SOLN
INTRAMUSCULAR | Status: DC | PRN
  Administered 2016-06-09: 500 mL

## 2016-06-09 MED ORDER — LIDOCAINE HCL (PF) 1 % IJ SOLN
INTRAMUSCULAR | Status: DC | PRN
  Administered 2016-06-09: 2 mL

## 2016-06-09 MED ORDER — METOPROLOL TARTRATE 5 MG/5ML IV SOLN: 2.0000 mg | INTRAVENOUS | Status: DC | PRN

## 2016-06-09 SURGICAL SUPPLY — 18 items
BAG SNAP BAND KOVER 36X36 (MISCELLANEOUS) ×3 IMPLANT
BALLN ATLAS 12X40X75 (BALLOONS) ×3
BALLN ATLAS 14X40X75 (BALLOONS) ×3
BALLOON ATLAS 12X40X75 (BALLOONS) IMPLANT
BALLOON ATLAS 14X40X75 (BALLOONS) IMPLANT
COVER DOME SNAP 22 D (MISCELLANEOUS) ×3 IMPLANT
COVER PRB 48X5XTLSCP FOLD TPE (BAG) ×1 IMPLANT
COVER PROBE 5X48 (BAG) ×3
KIT ENCORE 26 ADVANTAGE (KITS) ×2 IMPLANT
KIT MICROINTRODUCER STIFF 5F (SHEATH) ×2 IMPLANT
PROTECTION STATION PRESSURIZED (MISCELLANEOUS) ×3
SHEATH PINNACLE R/O II 7F 4CM (SHEATH) ×2 IMPLANT
STATION PROTECTION PRESSURIZED (MISCELLANEOUS) ×1 IMPLANT
STOPCOCK MORSE 400PSI 3WAY (MISCELLANEOUS) ×3 IMPLANT
TAPE RADIOPAQUE TURBO (MISCELLANEOUS) ×4 IMPLANT
TRAY PV CATH (CUSTOM PROCEDURE TRAY) ×3 IMPLANT
TUBING CIL FLEX 10 FLL-RA (TUBING) ×3 IMPLANT
WIRE BENTSON .035X145CM (WIRE) ×2 IMPLANT

## 2016-06-09 NOTE — Progress Notes (Addendum)
Spoke with Dr Trula Slade and he states pt should leave the ace wrap over site along with ice until he returns to dialysis.  Spoke with Velva Harman [facility] and she stated there are no appointments available tomorrow and pt should come at his usual time on Thursday and that Dr Trula Slade is aware of this. Instructed pt and wife of these things with stated understanding

## 2016-06-09 NOTE — Progress Notes (Signed)
Spoke with Dr. Trula Slade regarding Mr. Grace Bushy hemodialysis apt today at [facility] following procedure. Was notified by charge nurse at [facility] that she cannot make a spot for him at [facility] tomorrow and if he is unable to come today, he will either need to have HD in hospital or go to regular HD apt Thursday. Per conversation with Dr. Trula Slade, patient was offered opportunity to be admitted to hospital for hemodialysis today which patient declined. Patient was made aware of no available time slot tomorrow per Dr. Trula Slade. Patient told Dr. Trula Slade he will wait and go Thursday, that he does not feel he can make it to HD today. Camera operator at [facility] notified.   Will call CN at [facility] and ask if there are cancellations tomorrow at kidney center to call Mr. Burrage and offer opportunity to take that spot.   Juanell Fairly, [facility] [phone] (Pager)

## 2016-06-09 NOTE — H&P (View-Only) (Signed)
This is a 70 y/o male with ESRD who had a fistula creation by Dr. Scot Dock on 12/13/2015.  He has started to use the fistula since May 2017.  He states it works sometimes and sometimes it doesn't.  His nephrologist Dr. [name] sent him here to evaluate malfunctioning av fistula.  He still has a working right IJ tunneled catheter.  Past medical history includes: HTN managed withCarvedilol  , DM and hyper cholesterolemia managed with Lipitor.   Past Medical History:  Diagnosis Date  . Asthma   . Bladder cancer ([place]) dx'd 1990   surg only  . Diabetes mellitus without complication ([place])   . ESRD (end stage renal disease) ([name])    Tues, Thurs, Sat dialysis  . History of cardiac catheterization    a. LHC 5/17: no obstructive CAD  . History of DVT (deep vein thrombosis)   . History of nuclear stress test    a. Myoview 4/17: EF 39%, inf, inf-lat, apical inf, apical lat, apical scar, intermediate risk  . Hyperlipidemia   . Hypertension   . NICM (nonischemic cardiomyopathy) ([place])    a. Echo 2/17: mod LVH, EF 35-40%, inf-lat and inf-sept HK, mod MR, severe LAE, small pericardial effusion    Past Surgical History:  Procedure Laterality Date  . arthroscopic knee surgery    . AV FISTULA PLACEMENT Left 12/13/2015   Procedure: ARTERIOVENOUS (AV) FISTULA CREATION;  Surgeon: Angelia Mould, MD;  Location: [facility];  Service: Vascular;  Laterality: Left;  . BACK SURGERY     2 times  . CARDIAC CATHETERIZATION N/A 02/28/2016   Procedure: Left Heart Cath and Coronary Angiography;  Surgeon: Larey Dresser, MD;  Location: [facility] CV LAB;  Service: Cardiovascular;  Laterality: N/A;  . CYSTOSCOPY    . INSERTION OF DIALYSIS CATHETER N/A 12/13/2015   Procedure: INSERTION OF DIALYSIS CATHETER;  Surgeon: Angelia Mould, MD;  Location: [facility] OR;  Service: Vascular;  Laterality: N/A;  . JOINT REPLACEMENT     hip     Social History Social History  Substance Use Topics  . Smoking status:  Former Research scientist (life sciences)  . Smokeless tobacco: Never Used  . Alcohol use No     Comment: Used to drink on the weekends, quit 2 years ago    Family History Family History  Problem Relation Age of Onset  . Arthritis Mother   . Cancer Father   . Heart disease Father   . Hypertension Sister   . Alcohol abuse Brother   . Diabetes Daughter   . Heart attack Paternal Uncle     Allergies  Allergies  Allergen Reactions  . Eggs Or Egg-Derived Products Other (See Comments)    Other reaction(s): Unknown Pt can have egg whites - not the yolk  . Foltx [Elfolate Plus] Other (See Comments)    Pt unsure of this  . Lac Bovis     Other reaction(s): Unknown  . Meperidine Other (See Comments)    Demerol  . Meperidine Hcl     Unknown  . Milk-Related Compounds     LACTOSE INTOLARENCE  . Penicillin G Other (See Comments)  . Penicillins     "Knocks me out."   Has patient had a PCN reaction causing immediate rash, facial/tongue/throat swelling, SOB or lightheadedness with hypotension: yes- blacked out Has patient had a PCN reaction causing severe rash involving mucus membranes or skin necrosis: unknown Has patient had a PCN reaction that required hospitalization: at MD office Has patient had  a PCN reaction occurring within the last 10 years: no If all of the above answers are "NO", then may proceed with Cephalosporin use.   . Povidone Iodine Other (See Comments)    unknown  . Sulfa Antibiotics Other (See Comments)    unknown  . Sulfonamide Derivatives     Unknown.   . Povidone-Iodine Rash     Current Outpatient Prescriptions  Medication Sig Dispense Refill  . allopurinol (ZYLOPRIM) 300 MG tablet Take 300 mg by mouth daily.    Marland Kitchen atorvastatin (LIPITOR) 40 MG tablet Take 40 mg by mouth every evening.  0  . BAYER BREEZE 2 TEST DISK Apply 1 each topically daily as needed (FOR TESTING BLOOD SUGAR).   0  . calcitRIOL (ROCALTROL) 0.5 MCG capsule Take 0.5 mcg by mouth 3 (three) times a week. Medication  given at dialysis on Tues Thurs Sat  1  . carvedilol (COREG) 3.125 MG tablet Take 3.125mg  by mouth twice daily on non-dialysis days. 60 tablet 3  . cyclobenzaprine (FLEXERIL) 5 MG tablet Take 5 mg by mouth 3 (three) times daily as needed for muscle spasms.    . famotidine (PEPCID) 20 MG tablet Take 1 tablet (20 mg total) by mouth 2 (two) times daily as needed for heartburn.    . fluticasone (FLONASE) 50 MCG/ACT nasal spray Place 1 spray into both nostrils daily as needed for allergies or rhinitis.    Marland Kitchen glipiZIDE (GLUCOTROL) 5 MG tablet Take 1 tablet (5 mg total) by mouth daily before breakfast. 30 tablet 0  . lidocaine-prilocaine (EMLA) cream APPLY TO SKIN AS NEED FOR  PREDIALYSIS  0  . Loratadine-Pseudoephedrine (CLARITIN-D 24 HOUR PO) Take 1 tablet by mouth daily. PATIENT NOT SURE OF THE DOSAGE    . multivitamin (RENA-VIT) TABS tablet Take 1 tablet by mouth at bedtime.   0  . oxyCODONE-acetaminophen (PERCOCET) 7.5-325 MG tablet Take 1 tablet by mouth every 4 (four) hours as needed for severe pain.    . SENSIPAR 30 MG tablet     . sevelamer carbonate (RENVELA) 800 MG tablet Take 2 tablets (1,600 mg total) by mouth 3 (three) times daily with meals. 90 tablet 0   No current facility-administered medications for this visit.     ROS:   General:  No weight loss, Fever, chills  HEENT: No recent headaches, no nasal bleeding, no visual changes, no sore throat  Neurologic: No dizziness, blackouts, seizures. No recent symptoms of stroke or mini- stroke. No recent episodes of slurred speech, or temporary blindness.  Cardiac: No recent episodes of chest pain/pressure, no shortness of breath at rest.  No shortness of breath with exertion.  Denies history of atrial fibrillation or irregular heartbeat  Vascular: No history of rest pain in feet.  No history of claudication.  No history of non-healing ulcer, No history of DVT   Pulmonary: No home oxygen, no productive cough, no hemoptysis,  No asthma or  wheezing  Musculoskeletal:  [ ]  Arthritis, [ ]  Low back pain,  [ ]  Joint pain  Hematologic:No history of hypercoagulable state.  No history of easy bleeding.  No history of anemia  Gastrointestinal: No hematochezia or melena,  No gastroesophageal reflux, no trouble swallowing  Urinary: [x chronic Kidney disease, [ ]  on HD - [ ]  MWF or [ x] TTHS, [ ]  Burning with urination, [ ]  Frequent urination, [ ]  Difficulty urinating;   Skin: No rashes  Psychological: No history of anxiety,  No history of depression   Physical  Examination  Vitals:   06/03/16 1603 06/03/16 1609  BP: (!) 146/79 (!) 143/78  Pulse: 79 82  Resp: 18   Temp: 99.2 F (37.3 C)   SpO2: 100%   Weight: 219 lb (99.3 kg)   Height: 5\' 8"  (1.727 m)     Body mass index is 33.3 kg/m.  General:  Alert and oriented, no acute distress HEENT: Normal Neck: No bruit or JVD Pulmonary: Clear to auscultation bilaterally Cardiac: Regular Rate and Rhythm without murmur Gastrointestinal: Soft, non-tender, non-distended, no mass, no scars Skin: No rash Extremity Pulses:  2+ radial, brachial pulses bilaterally Palpable thrill distal half of the fistula. Musculoskeletal: No deformity or edema  Neurologic: Upper and lower extremity motor 5/5 and symmetric  DATA:  Competing branches left av fistula, Depth is 0.56-0.71 proximally with narrowing above the anastomosis.   ASSESSMENT:  Malfunctioning left BC  AV fistula   PLAN: We will plan on a fistulogram with possible balloon angioplasty Tuesday 29 2017 by Dr. Trula Slade.  He may need ligation of competing branches as well pending the out come.    COLLINS, EMMA MAUREEN PA-C [facility]  The patient was seen in conjunction with Dr. Trula Slade today.  I agree with the above.  I have seen and evaluated the patient.  He had a left brachiocephalic fistula created by Dr. Scot Dock which is being difficult to use.  His ultrasound today suggested several  potential problems.  The first is that there is a significant stenosis in the distal brachium.  The second is multiple relatively large branches.  The third is the depth of the fistula.  I proposed several potential options.  The first of which will be proceeding with a fistulogram on Tuesday, August 29.  I'll plan on cannulation near the arterial venous anastomosis and imaging his entire fistula and intervening on the stenosis if needed.  If this does not soft his problems, the next step would be to proceed with elevation of his fistula and branch ligation.  His fistulogram will be performed first and determination of the next step would made after these results  Annamarie Major

## 2016-06-09 NOTE — Op Note (Signed)
    Patient name: Albert Stanley MRN: [id number] DOB: 1946-06-01 Sex: male  06/09/2016 Pre-operative Diagnosis: ESRD Post-operative diagnosis:  Same Surgeon:  Annamarie Major Procedure Performed:  1.  U/s guided access left cephalic vein  2.  fistulogram  3.  PTA subclavian vein     Indications:  The patient is having difficulty with HD access.  Ultrasound identified several areas of concern and he is here today for further evaluation.  Procedure:  The patient was identified in the holding area and taken to room 8.  The patient was then placed supine on the table and prepped and draped in the usual sterile fashion.  A time out was called.  Ultrasound was used to evaluate the fistula.  The vein was patent and compressible.  A digital ultrasound image was acquired.  The fistula was then accessed under ultrasound guidance using a micropuncture needle.  An 018 wire was then asvanced without resistance and a micropuncture sheath was placed.  Contrast injections were then performed through the sheath.  Findings:  No significant stenosis is noted within the cephalic vein or at the arterial venous anastomosis.  There are several branches which do not appear to be hemodynamically significant.  Within the central venous system there is a 70% stenosis within the subclavian vein.   Intervention:  After the above images were acquired the decision was made to proceed with intervention.  Over a 035 wire, a 7 French sheath was inserted.  No heparin was given.  I first selected a 12 x 40 Atlas balloon and performed balloon angioplasty of the subclavian vein taking the balloon to 10 atm for 1 minute.  Follow-up imaging revealed suboptimal result and therefore I upsized to a 14 x 40 Atlas balloon.  This balloon was taken to 8 atm within the subclavian vein.  It was held for 1 minute.  Follow-up imaging revealed significant improvement in the opacification of the subclavian vein with residual stenosis down to  approximately 10%.  Catheters and wires were removed.  A pursestring suture was used for closure of the sheath which was removed.  The patient did develop bleeding from around the closure site.  Manual pressure was then held.  Impression:  #1  significant left subclavian vein stenosis successfully dilated using a 14 x 40 Atlas balloon  #2  widely patent left cephalic vein fistula  #3  no significant anastomotic narrowing  #4  by ultrasound the vein appears to be at least 1 cm deep.  If he continues to have difficulty with cannulation, the next step would be elevation of his fistula.    Theotis Burrow, M.D. [facility] [place] Office: [phone] Pager:  [phone]

## 2016-06-09 NOTE — Progress Notes (Deleted)
Spoke with Dr. Trula Slade regarding [name] hemodialysis apt today at [facility] following procedure. Was notified by charge nurse at [facility] that she cannot make a spot for him at [facility] tomorrow and if he is unable to come today, he will either need to have HD in hospital or go to regular HD apt Thursday. Per conversation with Dr. Trula Slade, patient was offered opportunity to be admitted for hemodialysis today which patient declined. Patient was made aware of no available time slot tomorrow. Patient told Dr. Trula Slade he will wait and go Thursday, that he does not feel he can make it to HD today. Camera operator at [facility] notified.    Juanell Fairly, [facility] [phone] (Pager)

## 2016-06-09 NOTE — Discharge Instructions (Signed)
Fistulogram, Care After °Refer to this sheet in the next few weeks. These instructions provide you with information on caring for yourself after your procedure. Your health care provider may also give you more specific instructions. Your treatment has been planned according to current medical practices, but problems sometimes occur. Call your health care provider if you have any problems or questions after your procedure. °WHAT TO EXPECT AFTER THE PROCEDURE °After your procedure, it is typical to have the following: °· A small amount of discomfort in the area where the catheters were placed. °· A small amount of bruising around the fistula. °· Sleepiness and fatigue. °HOME CARE INSTRUCTIONS °· Rest at home for the day following your procedure. °· Do not drive or operate heavy machinery while taking pain medicine. °· Take medicines only as directed by your health care provider. °· Do not take baths, swim, or use a hot tub until your health care provider approves. You may shower 24 hours after the procedure or as directed by your health care provider. °· There are many different ways to close and cover an incision, including stitches, skin glue, and adhesive strips. Follow your health care provider's instructions on: °¨ Incision care. °¨ Bandage (dressing) changes and removal. °¨ Incision closure removal. °· Monitor your dialysis fistula carefully. °SEEK MEDICAL CARE IF: °· You have drainage, redness, swelling, or pain at your catheter site. °· You have a fever. °· You have chills. °SEEK IMMEDIATE MEDICAL CARE IF: °· You feel weak. °· You have trouble balancing. °· You have trouble moving your arms or legs. °· You have problems with your speech or vision. °· You can no longer feel a vibration or buzz when you put your fingers over your dialysis fistula. °· The limb that was used for the procedure: °¨ Swells. °¨ Is painful. °¨ Is cold. °¨ Is discolored, such as blue or pale white. °  °This information is not intended  to replace advice given to you by your health care provider. Make sure you discuss any questions you have with your health care provider. °  °Document Released: 02/12/2014 Document Reviewed: 02/12/2014 °Elsevier Interactive Patient Education ©2016 Elsevier Inc. ° °

## 2016-06-09 NOTE — Interval H&P Note (Signed)
History and Physical Interval Note:  06/09/2016 8:16 AM  Albert Stanley  has presented today for surgery, with the diagnosis of poor flow in fistula  The various methods of treatment have been discussed with the patient and family. After consideration of risks, benefits and other options for treatment, the patient has consented to  Procedure(s) with comments: Fistulagram (Left) - ARM as a surgical intervention .  The patient's history has been reviewed, patient examined, no change in status, stable for surgery.  I have reviewed the patient's chart and labs.  Questions were answered to the patient's satisfaction.     Albert Stanley

## 2016-06-11 DIAGNOSIS — E1122 Type 2 diabetes mellitus with diabetic chronic kidney disease: Secondary | ICD-10-CM | POA: Diagnosis not present

## 2016-06-11 DIAGNOSIS — N2581 Secondary hyperparathyroidism of renal origin: Secondary | ICD-10-CM | POA: Diagnosis not present

## 2016-06-11 DIAGNOSIS — N186 End stage renal disease: Secondary | ICD-10-CM | POA: Diagnosis not present

## 2016-06-11 DIAGNOSIS — Z992 Dependence on renal dialysis: Secondary | ICD-10-CM | POA: Diagnosis not present

## 2016-06-11 DIAGNOSIS — D509 Iron deficiency anemia, unspecified: Secondary | ICD-10-CM | POA: Diagnosis not present

## 2016-06-11 DIAGNOSIS — E1129 Type 2 diabetes mellitus with other diabetic kidney complication: Secondary | ICD-10-CM | POA: Diagnosis not present

## 2016-06-11 DIAGNOSIS — D631 Anemia in chronic kidney disease: Secondary | ICD-10-CM | POA: Diagnosis not present

## 2016-06-13 DIAGNOSIS — D509 Iron deficiency anemia, unspecified: Secondary | ICD-10-CM | POA: Diagnosis not present

## 2016-06-13 DIAGNOSIS — N2581 Secondary hyperparathyroidism of renal origin: Secondary | ICD-10-CM | POA: Diagnosis not present

## 2016-06-13 DIAGNOSIS — E1129 Type 2 diabetes mellitus with other diabetic kidney complication: Secondary | ICD-10-CM | POA: Diagnosis not present

## 2016-06-13 DIAGNOSIS — N186 End stage renal disease: Secondary | ICD-10-CM | POA: Diagnosis not present

## 2016-06-13 DIAGNOSIS — D631 Anemia in chronic kidney disease: Secondary | ICD-10-CM | POA: Diagnosis not present

## 2016-06-16 ENCOUNTER — Telehealth: Payer: Self-pay

## 2016-06-16 DIAGNOSIS — N2581 Secondary hyperparathyroidism of renal origin: Secondary | ICD-10-CM | POA: Diagnosis not present

## 2016-06-16 DIAGNOSIS — D509 Iron deficiency anemia, unspecified: Secondary | ICD-10-CM | POA: Diagnosis not present

## 2016-06-16 DIAGNOSIS — E1129 Type 2 diabetes mellitus with other diabetic kidney complication: Secondary | ICD-10-CM | POA: Diagnosis not present

## 2016-06-16 DIAGNOSIS — N186 End stage renal disease: Secondary | ICD-10-CM | POA: Diagnosis not present

## 2016-06-16 DIAGNOSIS — D631 Anemia in chronic kidney disease: Secondary | ICD-10-CM | POA: Diagnosis not present

## 2016-06-16 NOTE — Telephone Encounter (Signed)
Call returned to pt. following answering service message.  The pt. C/o swelling of right arm from shoulder to the hand.  Reported that when he woke up today he had no feeling in all the fingers of the right hand. Denied any change in temperature or color to the right hand/ fingers. Stated since he has been up this AM, the feeling in the fingers has improved.  Stated it is difficult to use the right arm and hand due to the swelling.  Stated that he is having trouble gripping with the right hand.  Notified Dr. Trula Slade of pt's symptoms.  Initially, Dr. Trula Slade recommended to send pt. to the ER for evaluation.  Then, Dr. Trula Slade recommended to schedule for a fisutulogram tomorrow in the [facility] lab.  Unable to schedule procedure for pt. on 9/6, due to no time available.  Notified the [facility] to advise the pt. to go to the ER following dialysis today, to evaluate symptoms of swelling and numbness in right arm/ hand.  Spoke with Lenna Sciara; stated she will inform the pt. of recommendation.

## 2016-06-18 ENCOUNTER — Other Ambulatory Visit: Payer: Self-pay

## 2016-06-18 DIAGNOSIS — N2581 Secondary hyperparathyroidism of renal origin: Secondary | ICD-10-CM | POA: Diagnosis not present

## 2016-06-18 DIAGNOSIS — D631 Anemia in chronic kidney disease: Secondary | ICD-10-CM | POA: Diagnosis not present

## 2016-06-18 DIAGNOSIS — N186 End stage renal disease: Secondary | ICD-10-CM | POA: Diagnosis not present

## 2016-06-18 DIAGNOSIS — D509 Iron deficiency anemia, unspecified: Secondary | ICD-10-CM | POA: Diagnosis not present

## 2016-06-18 DIAGNOSIS — E1129 Type 2 diabetes mellitus with other diabetic kidney complication: Secondary | ICD-10-CM | POA: Diagnosis not present

## 2016-06-18 NOTE — Telephone Encounter (Signed)
Rec'd phone call from [facility].  Reported that the pt. chose not to go to the ER on Tuesday, because "there were too many people there."  Is requesting to have the Fistulogram scheduled, as was advised by Dr. Trula Slade.  Per Lenna Sciara, at the [facility], the pt. is experiencing symptoms in the left arm, and NOT the right arm as he reported on 9/5.  Will schedule for Fistulogram of left arm AVF; poss. intervention on 06/22/16 with Dr. Scot Dock.  Will fax instructions to the [facility] to give to pt.

## 2016-06-19 ENCOUNTER — Other Ambulatory Visit: Payer: Self-pay

## 2016-06-20 DIAGNOSIS — E1129 Type 2 diabetes mellitus with other diabetic kidney complication: Secondary | ICD-10-CM | POA: Diagnosis not present

## 2016-06-20 DIAGNOSIS — D631 Anemia in chronic kidney disease: Secondary | ICD-10-CM | POA: Diagnosis not present

## 2016-06-20 DIAGNOSIS — D509 Iron deficiency anemia, unspecified: Secondary | ICD-10-CM | POA: Diagnosis not present

## 2016-06-20 DIAGNOSIS — N2581 Secondary hyperparathyroidism of renal origin: Secondary | ICD-10-CM | POA: Diagnosis not present

## 2016-06-20 DIAGNOSIS — N186 End stage renal disease: Secondary | ICD-10-CM | POA: Diagnosis not present

## 2016-06-22 DIAGNOSIS — M25552 Pain in left hip: Secondary | ICD-10-CM | POA: Diagnosis not present

## 2016-06-22 DIAGNOSIS — G894 Chronic pain syndrome: Secondary | ICD-10-CM | POA: Diagnosis not present

## 2016-06-22 DIAGNOSIS — G890 Central pain syndrome: Secondary | ICD-10-CM | POA: Diagnosis not present

## 2016-06-22 DIAGNOSIS — M545 Low back pain: Secondary | ICD-10-CM | POA: Diagnosis not present

## 2016-06-22 DIAGNOSIS — Z79891 Long term (current) use of opiate analgesic: Secondary | ICD-10-CM | POA: Diagnosis not present

## 2016-06-22 DIAGNOSIS — M25512 Pain in left shoulder: Secondary | ICD-10-CM | POA: Diagnosis not present

## 2016-06-22 DIAGNOSIS — M25562 Pain in left knee: Secondary | ICD-10-CM | POA: Diagnosis not present

## 2016-06-23 DIAGNOSIS — N186 End stage renal disease: Secondary | ICD-10-CM | POA: Diagnosis not present

## 2016-06-23 DIAGNOSIS — D509 Iron deficiency anemia, unspecified: Secondary | ICD-10-CM | POA: Diagnosis not present

## 2016-06-23 DIAGNOSIS — E1129 Type 2 diabetes mellitus with other diabetic kidney complication: Secondary | ICD-10-CM | POA: Diagnosis not present

## 2016-06-23 DIAGNOSIS — N2581 Secondary hyperparathyroidism of renal origin: Secondary | ICD-10-CM | POA: Diagnosis not present

## 2016-06-23 DIAGNOSIS — D631 Anemia in chronic kidney disease: Secondary | ICD-10-CM | POA: Diagnosis not present

## 2016-06-25 ENCOUNTER — Encounter (HOSPITAL_COMMUNITY)
Admission: RE | Disposition: A | Discharge status: Home / Self Care | Payer: Self-pay | Source: Ambulatory Visit | Attending: Vascular Surgery

## 2016-06-25 ENCOUNTER — Ambulatory Visit (HOSPITAL_COMMUNITY)
Admission: RE | Admit: 2016-06-25 | Discharge: 2016-06-25 | Disposition: A | Discharge status: Home / Self Care | Payer: Medicare Other | Source: Ambulatory Visit | Attending: Vascular Surgery | Admitting: Vascular Surgery

## 2016-06-25 DIAGNOSIS — Z86718 Personal history of other venous thrombosis and embolism: Secondary | ICD-10-CM | POA: Diagnosis not present

## 2016-06-25 DIAGNOSIS — N2581 Secondary hyperparathyroidism of renal origin: Secondary | ICD-10-CM | POA: Diagnosis not present

## 2016-06-25 DIAGNOSIS — N186 End stage renal disease: Secondary | ICD-10-CM | POA: Diagnosis not present

## 2016-06-25 DIAGNOSIS — Z8551 Personal history of malignant neoplasm of bladder: Secondary | ICD-10-CM | POA: Insufficient documentation

## 2016-06-25 DIAGNOSIS — E7800 Pure hypercholesterolemia, unspecified: Secondary | ICD-10-CM | POA: Insufficient documentation

## 2016-06-25 DIAGNOSIS — J45909 Unspecified asthma, uncomplicated: Secondary | ICD-10-CM | POA: Diagnosis not present

## 2016-06-25 DIAGNOSIS — D631 Anemia in chronic kidney disease: Secondary | ICD-10-CM | POA: Diagnosis not present

## 2016-06-25 DIAGNOSIS — Z87891 Personal history of nicotine dependence: Secondary | ICD-10-CM | POA: Diagnosis not present

## 2016-06-25 DIAGNOSIS — Z992 Dependence on renal dialysis: Secondary | ICD-10-CM | POA: Insufficient documentation

## 2016-06-25 DIAGNOSIS — E1122 Type 2 diabetes mellitus with diabetic chronic kidney disease: Secondary | ICD-10-CM | POA: Diagnosis not present

## 2016-06-25 DIAGNOSIS — E785 Hyperlipidemia, unspecified: Secondary | ICD-10-CM | POA: Insufficient documentation

## 2016-06-25 DIAGNOSIS — T82898A Other specified complication of vascular prosthetic devices, implants and grafts, initial encounter: Secondary | ICD-10-CM | POA: Diagnosis not present

## 2016-06-25 DIAGNOSIS — Z833 Family history of diabetes mellitus: Secondary | ICD-10-CM | POA: Diagnosis not present

## 2016-06-25 DIAGNOSIS — I120 Hypertensive chronic kidney disease with stage 5 chronic kidney disease or end stage renal disease: Secondary | ICD-10-CM | POA: Diagnosis not present

## 2016-06-25 DIAGNOSIS — Y832 Surgical operation with anastomosis, bypass or graft as the cause of abnormal reaction of the patient, or of later complication, without mention of misadventure at the time of the procedure: Secondary | ICD-10-CM | POA: Insufficient documentation

## 2016-06-25 DIAGNOSIS — D509 Iron deficiency anemia, unspecified: Secondary | ICD-10-CM | POA: Diagnosis not present

## 2016-06-25 DIAGNOSIS — I428 Other cardiomyopathies: Secondary | ICD-10-CM | POA: Diagnosis not present

## 2016-06-25 DIAGNOSIS — Z880 Allergy status to penicillin: Secondary | ICD-10-CM | POA: Insufficient documentation

## 2016-06-25 DIAGNOSIS — Z882 Allergy status to sulfonamides status: Secondary | ICD-10-CM | POA: Diagnosis not present

## 2016-06-25 DIAGNOSIS — Z8249 Family history of ischemic heart disease and other diseases of the circulatory system: Secondary | ICD-10-CM | POA: Insufficient documentation

## 2016-06-25 DIAGNOSIS — E1129 Type 2 diabetes mellitus with other diabetic kidney complication: Secondary | ICD-10-CM | POA: Diagnosis not present

## 2016-06-25 HISTORY — PX: PERIPHERAL VASCULAR CATHETERIZATION: SHX172C

## 2016-06-25 LAB — POCT I-STAT, CHEM 8
BUN: 60 mg/dL — AB (ref 6–20)
CALCIUM ION: 1.14 mmol/L — AB (ref 1.15–1.40)
CREATININE: 5.7 mg/dL — AB (ref 0.61–1.24)
Chloride: 100 mmol/L — ABNORMAL LOW (ref 101–111)
Glucose, Bld: 108 mg/dL — ABNORMAL HIGH (ref 65–99)
HEMATOCRIT: 32 % — AB (ref 39.0–52.0)
HEMOGLOBIN: 10.9 g/dL — AB (ref 13.0–17.0)
Potassium: 3.9 mmol/L (ref 3.5–5.1)
SODIUM: 140 mmol/L (ref 135–145)
TCO2: 30 mmol/L (ref 0–100)

## 2016-06-25 LAB — GLUCOSE, CAPILLARY: Glucose-Capillary: 101 mg/dL — ABNORMAL HIGH (ref 65–99)

## 2016-06-25 SURGERY — A/V SHUNTOGRAM/FISTULAGRAM
Anesthesia: LOCAL | Laterality: Left

## 2016-06-25 MED ORDER — SODIUM CHLORIDE 0.9% FLUSH: 3.0000 mL | INTRAVENOUS | Status: DC | PRN

## 2016-06-25 MED ORDER — MIDAZOLAM HCL 2 MG/2ML IJ SOLN
INTRAMUSCULAR | Status: DC | PRN
  Administered 2016-06-25: 1 mg via INTRAVENOUS

## 2016-06-25 MED ORDER — HYDRALAZINE HCL 20 MG/ML IJ SOLN: 5.0000 mg | INTRAMUSCULAR | Status: DC | PRN

## 2016-06-25 MED ORDER — FENTANYL CITRATE (PF) 100 MCG/2ML IJ SOLN
INTRAMUSCULAR | Status: DC | PRN
  Administered 2016-06-25: 25 ug via INTRAVENOUS

## 2016-06-25 MED ORDER — DOCUSATE SODIUM 100 MG PO CAPS: 100.0000 mg | ORAL_CAPSULE | Freq: Every day | ORAL | Status: DC

## 2016-06-25 MED ORDER — MIDAZOLAM HCL 2 MG/2ML IJ SOLN
INTRAMUSCULAR | Status: AC
  Filled 2016-06-25: qty 2

## 2016-06-25 MED ORDER — LIDOCAINE HCL (PF) 1 % IJ SOLN
INTRAMUSCULAR | Status: AC
  Filled 2016-06-25: qty 30

## 2016-06-25 MED ORDER — OXYCODONE HCL 5 MG PO TABS
5.0000 mg | ORAL_TABLET | ORAL | Status: DC | PRN
  Administered 2016-06-25: 10 mg via ORAL

## 2016-06-25 MED ORDER — GUAIFENESIN-DM 100-10 MG/5ML PO SYRP: 15.0000 mL | ORAL_SOLUTION | ORAL | Status: DC | PRN

## 2016-06-25 MED ORDER — HEPARIN (PORCINE) IN NACL 2-0.9 UNIT/ML-% IJ SOLN
INTRAMUSCULAR | Status: DC | PRN
  Administered 2016-06-25: 500 mL

## 2016-06-25 MED ORDER — OXYCODONE HCL 5 MG PO TABS
ORAL_TABLET | ORAL | Status: DC
Start: 2016-06-25 — End: 2016-06-25
  Filled 2016-06-25: qty 2

## 2016-06-25 MED ORDER — ALUM & MAG HYDROXIDE-SIMETH 200-200-20 MG/5ML PO SUSP: 15.0000 mL | ORAL | Status: DC | PRN

## 2016-06-25 MED ORDER — ACETAMINOPHEN 325 MG PO TABS: 325.0000 mg | ORAL_TABLET | ORAL | Status: DC | PRN

## 2016-06-25 MED ORDER — PHENOL 1.4 % MT LIQD: 1.0000 | OROMUCOSAL | Status: DC | PRN

## 2016-06-25 MED ORDER — ONDANSETRON HCL 4 MG/2ML IJ SOLN: 4.0000 mg | Freq: Four times a day (QID) | INTRAMUSCULAR | Status: DC | PRN

## 2016-06-25 MED ORDER — LABETALOL HCL 5 MG/ML IV SOLN: 10.0000 mg | INTRAVENOUS | Status: DC | PRN

## 2016-06-25 MED ORDER — METOPROLOL TARTRATE 5 MG/5ML IV SOLN: 2.0000 mg | INTRAVENOUS | Status: DC | PRN

## 2016-06-25 MED ORDER — ACETAMINOPHEN 325 MG RE SUPP: 325.0000 mg | RECTAL | Status: DC | PRN

## 2016-06-25 MED ORDER — LIDOCAINE HCL (PF) 1 % IJ SOLN
INTRAMUSCULAR | Status: DC | PRN
  Administered 2016-06-25: 15 mL via INTRADERMAL

## 2016-06-25 MED ORDER — SODIUM CHLORIDE 0.9 % IV SOLN
INTRAVENOUS | Status: DC | PRN
  Administered 2016-06-25: 10 mL/h via INTRAVENOUS

## 2016-06-25 MED ORDER — IODIXANOL 320 MG/ML IV SOLN
INTRAVENOUS | Status: DC | PRN
  Administered 2016-06-25: 35 mL via INTRAVENOUS

## 2016-06-25 MED ORDER — FENTANYL CITRATE (PF) 100 MCG/2ML IJ SOLN
INTRAMUSCULAR | Status: AC
  Filled 2016-06-25: qty 2

## 2016-06-25 SURGICAL SUPPLY — 10 items
BAG SNAP BAND KOVER 36X36 (MISCELLANEOUS) ×2 IMPLANT
COVER DOME SNAP 22 D (MISCELLANEOUS) ×2 IMPLANT
COVER PRB 48X5XTLSCP FOLD TPE (BAG) ×1 IMPLANT
COVER PROBE 5X48 (BAG) ×2
KIT MICROINTRODUCER STIFF 5F (SHEATH) ×1 IMPLANT
PROTECTION STATION PRESSURIZED (MISCELLANEOUS) ×2
STATION PROTECTION PRESSURIZED (MISCELLANEOUS) ×1 IMPLANT
STOPCOCK MORSE 400PSI 3WAY (MISCELLANEOUS) ×2 IMPLANT
TRAY PV CATH (CUSTOM PROCEDURE TRAY) ×2 IMPLANT
TUBING CIL FLEX 10 FLL-RA (TUBING) ×2 IMPLANT

## 2016-06-25 NOTE — Interval H&P Note (Signed)
History and Physical Interval Note:  06/25/2016 10:04 AM  Albert Stanley  has presented today for surgery, with the diagnosis of end stage renal, swelling in left arm, numbness in fingers  The various methods of treatment have been discussed with the patient and family. After consideration of risks, benefits and other options for treatment, the patient has consented to  Procedure(s): Fistulagram (Left) as a surgical intervention .  The patient's history has been reviewed, patient examined, no change in status, stable for surgery.  I have reviewed the patient's chart and labs.  Questions were answered to the patient's satisfaction.     [name], [name]   Arm swelling and numbness after last procedure,  Returns today for repeat fistulogram to evaluate for complications  [name]

## 2016-06-25 NOTE — H&P (View-Only) (Signed)
This is a 70 y/o male with ESRD who had a fistula creation by Dr. Scot Dock on 12/13/2015.  He has started to use the fistula since May 2017.  He states it works sometimes and sometimes it doesn't.  His nephrologist Dr. [name] sent him here to evaluate malfunctioning av fistula.  He still has a working right IJ tunneled catheter.  Past medical history includes: HTN managed withCarvedilol  , DM and hyper cholesterolemia managed with Lipitor.   Past Medical History:  Diagnosis Date  . Asthma   . Bladder cancer ([place]) dx'd 1990   surg only  . Diabetes mellitus without complication ([place])   . ESRD (end stage renal disease) ([name])    Tues, Thurs, Sat dialysis  . History of cardiac catheterization    a. LHC 5/17: no obstructive CAD  . History of DVT (deep vein thrombosis)   . History of nuclear stress test    a. Myoview 4/17: EF 39%, inf, inf-lat, apical inf, apical lat, apical scar, intermediate risk  . Hyperlipidemia   . Hypertension   . NICM (nonischemic cardiomyopathy) ([place])    a. Echo 2/17: mod LVH, EF 35-40%, inf-lat and inf-sept HK, mod MR, severe LAE, small pericardial effusion    Past Surgical History:  Procedure Laterality Date  . arthroscopic knee surgery    . AV FISTULA PLACEMENT Left 12/13/2015   Procedure: ARTERIOVENOUS (AV) FISTULA CREATION;  Surgeon: Angelia Mould, MD;  Location: [place];  Service: Vascular;  Laterality: Left;  . BACK SURGERY     2 times  . CARDIAC CATHETERIZATION N/A 02/28/2016   Procedure: Left Heart Cath and Coronary Angiography;  Surgeon: Larey Dresser, MD;  Location: [place] CV LAB;  Service: Cardiovascular;  Laterality: N/A;  . CYSTOSCOPY    . INSERTION OF DIALYSIS CATHETER N/A 12/13/2015   Procedure: INSERTION OF DIALYSIS CATHETER;  Surgeon: Angelia Mould, MD;  Location: [facility] OR;  Service: Vascular;  Laterality: N/A;  . JOINT REPLACEMENT     hip     Social History Social History  Substance Use Topics  . Smoking status:  Former Research scientist (life sciences)  . Smokeless tobacco: Never Used  . Alcohol use No     Comment: Used to drink on the weekends, quit 2 years ago    Family History Family History  Problem Relation Age of Onset  . Arthritis Mother   . Cancer Father   . Heart disease Father   . Hypertension Sister   . Alcohol abuse Brother   . Diabetes Daughter   . Heart attack Paternal Uncle     Allergies  Allergies  Allergen Reactions  . Eggs Or Egg-Derived Products Other (See Comments)    Other reaction(s): Unknown Pt can have egg whites - not the yolk  . Foltx [Elfolate Plus] Other (See Comments)    Pt unsure of this  . Lac Bovis     Other reaction(s): Unknown  . Meperidine Other (See Comments)    Demerol  . Meperidine Hcl     Unknown  . Milk-Related Compounds     LACTOSE INTOLARENCE  . Penicillin G Other (See Comments)  . Penicillins     "Knocks me out."   Has patient had a PCN reaction causing immediate rash, facial/tongue/throat swelling, SOB or lightheadedness with hypotension: yes- blacked out Has patient had a PCN reaction causing severe rash involving mucus membranes or skin necrosis: unknown Has patient had a PCN reaction that required hospitalization: at MD office Has patient had  a PCN reaction occurring within the last 10 years: no If all of the above answers are "NO", then may proceed with Cephalosporin use.   . Povidone Iodine Other (See Comments)    unknown  . Sulfa Antibiotics Other (See Comments)    unknown  . Sulfonamide Derivatives     Unknown.   . Povidone-Iodine Rash     Current Outpatient Prescriptions  Medication Sig Dispense Refill  . allopurinol (ZYLOPRIM) 300 MG tablet Take 300 mg by mouth daily.    Marland Kitchen atorvastatin (LIPITOR) 40 MG tablet Take 40 mg by mouth every evening.  0  . BAYER BREEZE 2 TEST DISK Apply 1 each topically daily as needed (FOR TESTING BLOOD SUGAR).   0  . calcitRIOL (ROCALTROL) 0.5 MCG capsule Take 0.5 mcg by mouth 3 (three) times a week. Medication  given at dialysis on Tues Thurs Sat  1  . carvedilol (COREG) 3.125 MG tablet Take 3.125mg  by mouth twice daily on non-dialysis days. 60 tablet 3  . cyclobenzaprine (FLEXERIL) 5 MG tablet Take 5 mg by mouth 3 (three) times daily as needed for muscle spasms.    . famotidine (PEPCID) 20 MG tablet Take 1 tablet (20 mg total) by mouth 2 (two) times daily as needed for heartburn.    . fluticasone (FLONASE) 50 MCG/ACT nasal spray Place 1 spray into both nostrils daily as needed for allergies or rhinitis.    Marland Kitchen glipiZIDE (GLUCOTROL) 5 MG tablet Take 1 tablet (5 mg total) by mouth daily before breakfast. 30 tablet 0  . lidocaine-prilocaine (EMLA) cream APPLY TO SKIN AS NEED FOR  PREDIALYSIS  0  . Loratadine-Pseudoephedrine (CLARITIN-D 24 HOUR PO) Take 1 tablet by mouth daily. PATIENT NOT SURE OF THE DOSAGE    . multivitamin (RENA-VIT) TABS tablet Take 1 tablet by mouth at bedtime.   0  . oxyCODONE-acetaminophen (PERCOCET) 7.5-325 MG tablet Take 1 tablet by mouth every 4 (four) hours as needed for severe pain.    . SENSIPAR 30 MG tablet     . sevelamer carbonate (RENVELA) 800 MG tablet Take 2 tablets (1,600 mg total) by mouth 3 (three) times daily with meals. 90 tablet 0   No current facility-administered medications for this visit.     ROS:   General:  No weight loss, Fever, chills  HEENT: No recent headaches, no nasal bleeding, no visual changes, no sore throat  Neurologic: No dizziness, blackouts, seizures. No recent symptoms of stroke or mini- stroke. No recent episodes of slurred speech, or temporary blindness.  Cardiac: No recent episodes of chest pain/pressure, no shortness of breath at rest.  No shortness of breath with exertion.  Denies history of atrial fibrillation or irregular heartbeat  Vascular: No history of rest pain in feet.  No history of claudication.  No history of non-healing ulcer, No history of DVT   Pulmonary: No home oxygen, no productive cough, no hemoptysis,  No asthma or  wheezing  Musculoskeletal:  [ ]  Arthritis, [ ]  Low back pain,  [ ]  Joint pain  Hematologic:No history of hypercoagulable state.  No history of easy bleeding.  No history of anemia  Gastrointestinal: No hematochezia or melena,  No gastroesophageal reflux, no trouble swallowing  Urinary: [x chronic Kidney disease, [ ]  on HD - [ ]  MWF or [ x] TTHS, [ ]  Burning with urination, [ ]  Frequent urination, [ ]  Difficulty urinating;   Skin: No rashes  Psychological: No history of anxiety,  No history of depression   Physical  Examination  Vitals:   06/03/16 1603 06/03/16 1609  BP: (!) 146/79 (!) 143/78  Pulse: 79 82  Resp: 18   Temp: 99.2 F (37.3 C)   SpO2: 100%   Weight: 219 lb (99.3 kg)   Height: 5\' 8"  (1.727 m)     Body mass index is 33.3 kg/m.  General:  Alert and oriented, no acute distress HEENT: Normal Neck: No bruit or JVD Pulmonary: Clear to auscultation bilaterally Cardiac: Regular Rate and Rhythm without murmur Gastrointestinal: Soft, non-tender, non-distended, no mass, no scars Skin: No rash Extremity Pulses:  2+ radial, brachial pulses bilaterally Palpable thrill distal half of the fistula. Musculoskeletal: No deformity or edema  Neurologic: Upper and lower extremity motor 5/5 and symmetric  DATA:  Competing branches left av fistula, Depth is 0.56-0.71 proximally with narrowing above the anastomosis.   ASSESSMENT:  Malfunctioning left BC  AV fistula   PLAN: We will plan on a fistulogram with possible balloon angioplasty Tuesday 29 2017 by Dr. Trula Slade.  He may need ligation of competing branches as well pending the out come.    COLLINS, EMMA MAUREEN PA-C [facility]  The patient was seen in conjunction with Dr. Trula Slade today.  I agree with the above.  I have seen and evaluated the patient.  He had a left brachiocephalic fistula created by Dr. Scot Dock which is being difficult to use.  His ultrasound today suggested several  potential problems.  The first is that there is a significant stenosis in the distal brachium.  The second is multiple relatively large branches.  The third is the depth of the fistula.  I proposed several potential options.  The first of which will be proceeding with a fistulogram on Tuesday, August 29.  I'll plan on cannulation near the arterial venous anastomosis and imaging his entire fistula and intervening on the stenosis if needed.  If this does not soft his problems, the next step would be to proceed with elevation of his fistula and branch ligation.  His fistulogram will be performed first and determination of the next step would made after these results  Annamarie Major

## 2016-06-25 NOTE — Progress Notes (Signed)
Pt is ready to go home and wants pain med. Orders from Dr was placed late. Pt wants to wait till pain med given and wait standard 30 min post.

## 2016-06-25 NOTE — Discharge Instructions (Signed)
Fistulogram, Care After °Refer to this sheet in the next few weeks. These instructions provide you with information on caring for yourself after your procedure. Your health care provider may also give you more specific instructions. Your treatment has been planned according to current medical practices, but problems sometimes occur. Call your health care provider if you have any problems or questions after your procedure. °WHAT TO EXPECT AFTER THE PROCEDURE °After your procedure, it is typical to have the following: °· A small amount of discomfort in the area where the catheters were placed. °· A small amount of bruising around the fistula. °· Sleepiness and fatigue. °HOME CARE INSTRUCTIONS °· Rest at home for the day following your procedure. °· Do not drive or operate heavy machinery while taking pain medicine. °· Take medicines only as directed by your health care provider. °· Do not take baths, swim, or use a hot tub until your health care provider approves. You may shower 24 hours after the procedure or as directed by your health care provider. °· There are many different ways to close and cover an incision, including stitches, skin glue, and adhesive strips. Follow your health care provider's instructions on: °¨ Incision care. °¨ Bandage (dressing) changes and removal. °¨ Incision closure removal. °· Monitor your dialysis fistula carefully. °SEEK MEDICAL CARE IF: °· You have drainage, redness, swelling, or pain at your catheter site. °· You have a fever. °· You have chills. °SEEK IMMEDIATE MEDICAL CARE IF: °· You feel weak. °· You have trouble balancing. °· You have trouble moving your arms or legs. °· You have problems with your speech or vision. °· You can no longer feel a vibration or buzz when you put your fingers over your dialysis fistula. °· The limb that was used for the procedure: °¨ Swells. °¨ Is painful. °¨ Is cold. °¨ Is discolored, such as blue or pale white. °  °This information is not intended  to replace advice given to you by your health care provider. Make sure you discuss any questions you have with your health care provider. °  °Document Released: 02/12/2014 Document Reviewed: 02/12/2014 °Elsevier Interactive Patient Education ©2016 Elsevier Inc. ° °

## 2016-06-26 ENCOUNTER — Encounter (HOSPITAL_COMMUNITY): Payer: Self-pay | Admitting: Surgery

## 2016-06-26 DIAGNOSIS — T82898A Other specified complication of vascular prosthetic devices, implants and grafts, initial encounter: Secondary | ICD-10-CM

## 2016-06-26 DIAGNOSIS — N185 Chronic kidney disease, stage 5: Secondary | ICD-10-CM

## 2016-06-26 NOTE — Op Note (Signed)
    Patient name: Albert Stanley MRN: [id number] DOB: 1946-01-19 Sex: male  06/25/2016 Pre-operative Diagnosis: ESRD< Left arm swelling Post-operative diagnosis:  Same Surgeon:  Annamarie Major Procedure Performed:  1.  U/s guided access, L Cephalic vein  2.  fistulogram  3.  Conscious sedation    Indications:  Patient with arm swelling after recent fistulogram  Procedure:  The patient was identified in the holding area and taken to room 8.  The patient was then placed supine on the table and prepped and draped in the usual sterile fashion.  A time out was called. Conscious sedation was performed with IV Fentanyl and versed under continuous monitoring by the circulating nurse and myself.  HR, BP, and O2 sat were continuously monitored Ultrasound was used to evaluate the fistula.  The vein was patent and compressible.  A digital ultrasound image was acquired.  The fistula was then accessed under ultrasound guidance using a micropuncture needle.  An 018 wire was then asvanced without resistance and a micropuncture sheath was placed.  Contrast injections were then performed through the sheath.  Findings:  Mild recoil from recent SCV intervention with about 30-40% stenosis.  No significant narrowing within the fistula.  The A-V anastamosis is widely patent.  There is a large branch in the mid upper arm   Intervention:  none  Impression:  #1  Patient's arm swelling and hand numbness (which is improving) is not related to venous narrowing based on the fistulogram.   #2  Most likely source of arm swelling is from [facility] which occurred after sheath removal at his previous proceedure  #3  Would rest fistula until swelling improves (likely 2-3 weeks)  If he continuous to have difficulty, I would recommend elevation and branch ligation    V. Annamarie Major, M.D. [facility] [place] Office: [phone] Pager:  [phone]

## 2016-06-27 DIAGNOSIS — D631 Anemia in chronic kidney disease: Secondary | ICD-10-CM | POA: Diagnosis not present

## 2016-06-27 DIAGNOSIS — N186 End stage renal disease: Secondary | ICD-10-CM | POA: Diagnosis not present

## 2016-06-27 DIAGNOSIS — E1129 Type 2 diabetes mellitus with other diabetic kidney complication: Secondary | ICD-10-CM | POA: Diagnosis not present

## 2016-06-27 DIAGNOSIS — N2581 Secondary hyperparathyroidism of renal origin: Secondary | ICD-10-CM | POA: Diagnosis not present

## 2016-06-27 DIAGNOSIS — D509 Iron deficiency anemia, unspecified: Secondary | ICD-10-CM | POA: Diagnosis not present

## 2016-07-01 ENCOUNTER — Other Ambulatory Visit: Payer: Medicare Other

## 2016-07-02 DIAGNOSIS — N186 End stage renal disease: Secondary | ICD-10-CM | POA: Diagnosis not present

## 2016-07-02 DIAGNOSIS — D631 Anemia in chronic kidney disease: Secondary | ICD-10-CM | POA: Diagnosis not present

## 2016-07-02 DIAGNOSIS — E1129 Type 2 diabetes mellitus with other diabetic kidney complication: Secondary | ICD-10-CM | POA: Diagnosis not present

## 2016-07-02 DIAGNOSIS — N2581 Secondary hyperparathyroidism of renal origin: Secondary | ICD-10-CM | POA: Diagnosis not present

## 2016-07-02 DIAGNOSIS — D509 Iron deficiency anemia, unspecified: Secondary | ICD-10-CM | POA: Diagnosis not present

## 2016-07-04 DIAGNOSIS — N186 End stage renal disease: Secondary | ICD-10-CM | POA: Diagnosis not present

## 2016-07-04 DIAGNOSIS — D631 Anemia in chronic kidney disease: Secondary | ICD-10-CM | POA: Diagnosis not present

## 2016-07-04 DIAGNOSIS — N2581 Secondary hyperparathyroidism of renal origin: Secondary | ICD-10-CM | POA: Diagnosis not present

## 2016-07-04 DIAGNOSIS — E1129 Type 2 diabetes mellitus with other diabetic kidney complication: Secondary | ICD-10-CM | POA: Diagnosis not present

## 2016-07-04 DIAGNOSIS — D509 Iron deficiency anemia, unspecified: Secondary | ICD-10-CM | POA: Diagnosis not present

## 2016-07-07 DIAGNOSIS — D509 Iron deficiency anemia, unspecified: Secondary | ICD-10-CM | POA: Diagnosis not present

## 2016-07-07 DIAGNOSIS — N186 End stage renal disease: Secondary | ICD-10-CM | POA: Diagnosis not present

## 2016-07-07 DIAGNOSIS — N2581 Secondary hyperparathyroidism of renal origin: Secondary | ICD-10-CM | POA: Diagnosis not present

## 2016-07-07 DIAGNOSIS — D631 Anemia in chronic kidney disease: Secondary | ICD-10-CM | POA: Diagnosis not present

## 2016-07-07 DIAGNOSIS — E1129 Type 2 diabetes mellitus with other diabetic kidney complication: Secondary | ICD-10-CM | POA: Diagnosis not present

## 2016-07-09 DIAGNOSIS — D631 Anemia in chronic kidney disease: Secondary | ICD-10-CM | POA: Diagnosis not present

## 2016-07-09 DIAGNOSIS — N2581 Secondary hyperparathyroidism of renal origin: Secondary | ICD-10-CM | POA: Diagnosis not present

## 2016-07-09 DIAGNOSIS — D509 Iron deficiency anemia, unspecified: Secondary | ICD-10-CM | POA: Diagnosis not present

## 2016-07-09 DIAGNOSIS — E1129 Type 2 diabetes mellitus with other diabetic kidney complication: Secondary | ICD-10-CM | POA: Diagnosis not present

## 2016-07-09 DIAGNOSIS — N186 End stage renal disease: Secondary | ICD-10-CM | POA: Diagnosis not present

## 2016-07-11 DIAGNOSIS — N186 End stage renal disease: Secondary | ICD-10-CM | POA: Diagnosis not present

## 2016-07-11 DIAGNOSIS — N2581 Secondary hyperparathyroidism of renal origin: Secondary | ICD-10-CM | POA: Diagnosis not present

## 2016-07-11 DIAGNOSIS — Z992 Dependence on renal dialysis: Secondary | ICD-10-CM | POA: Diagnosis not present

## 2016-07-11 DIAGNOSIS — D631 Anemia in chronic kidney disease: Secondary | ICD-10-CM | POA: Diagnosis not present

## 2016-07-11 DIAGNOSIS — E1122 Type 2 diabetes mellitus with diabetic chronic kidney disease: Secondary | ICD-10-CM | POA: Diagnosis not present

## 2016-07-11 DIAGNOSIS — E1129 Type 2 diabetes mellitus with other diabetic kidney complication: Secondary | ICD-10-CM | POA: Diagnosis not present

## 2016-07-11 DIAGNOSIS — D509 Iron deficiency anemia, unspecified: Secondary | ICD-10-CM | POA: Diagnosis not present

## 2016-07-14 DIAGNOSIS — D509 Iron deficiency anemia, unspecified: Secondary | ICD-10-CM | POA: Diagnosis not present

## 2016-07-14 DIAGNOSIS — E1129 Type 2 diabetes mellitus with other diabetic kidney complication: Secondary | ICD-10-CM | POA: Diagnosis not present

## 2016-07-14 DIAGNOSIS — D631 Anemia in chronic kidney disease: Secondary | ICD-10-CM | POA: Diagnosis not present

## 2016-07-14 DIAGNOSIS — Z23 Encounter for immunization: Secondary | ICD-10-CM | POA: Diagnosis not present

## 2016-07-14 DIAGNOSIS — N186 End stage renal disease: Secondary | ICD-10-CM | POA: Diagnosis not present

## 2016-07-14 DIAGNOSIS — N2581 Secondary hyperparathyroidism of renal origin: Secondary | ICD-10-CM | POA: Diagnosis not present

## 2016-07-16 DIAGNOSIS — N2581 Secondary hyperparathyroidism of renal origin: Secondary | ICD-10-CM | POA: Diagnosis not present

## 2016-07-16 DIAGNOSIS — Z23 Encounter for immunization: Secondary | ICD-10-CM | POA: Diagnosis not present

## 2016-07-16 DIAGNOSIS — D509 Iron deficiency anemia, unspecified: Secondary | ICD-10-CM | POA: Diagnosis not present

## 2016-07-16 DIAGNOSIS — D631 Anemia in chronic kidney disease: Secondary | ICD-10-CM | POA: Diagnosis not present

## 2016-07-16 DIAGNOSIS — N186 End stage renal disease: Secondary | ICD-10-CM | POA: Diagnosis not present

## 2016-07-16 DIAGNOSIS — E1129 Type 2 diabetes mellitus with other diabetic kidney complication: Secondary | ICD-10-CM | POA: Diagnosis not present

## 2016-07-18 DIAGNOSIS — D631 Anemia in chronic kidney disease: Secondary | ICD-10-CM | POA: Diagnosis not present

## 2016-07-18 DIAGNOSIS — Z23 Encounter for immunization: Secondary | ICD-10-CM | POA: Diagnosis not present

## 2016-07-18 DIAGNOSIS — E1129 Type 2 diabetes mellitus with other diabetic kidney complication: Secondary | ICD-10-CM | POA: Diagnosis not present

## 2016-07-18 DIAGNOSIS — D509 Iron deficiency anemia, unspecified: Secondary | ICD-10-CM | POA: Diagnosis not present

## 2016-07-18 DIAGNOSIS — N2581 Secondary hyperparathyroidism of renal origin: Secondary | ICD-10-CM | POA: Diagnosis not present

## 2016-07-18 DIAGNOSIS — N186 End stage renal disease: Secondary | ICD-10-CM | POA: Diagnosis not present

## 2016-07-20 DIAGNOSIS — M25562 Pain in left knee: Secondary | ICD-10-CM | POA: Diagnosis not present

## 2016-07-20 DIAGNOSIS — M25552 Pain in left hip: Secondary | ICD-10-CM | POA: Diagnosis not present

## 2016-07-20 DIAGNOSIS — M545 Low back pain: Secondary | ICD-10-CM | POA: Diagnosis not present

## 2016-07-20 DIAGNOSIS — G894 Chronic pain syndrome: Secondary | ICD-10-CM | POA: Diagnosis not present

## 2016-07-20 DIAGNOSIS — M25512 Pain in left shoulder: Secondary | ICD-10-CM | POA: Diagnosis not present

## 2016-07-20 DIAGNOSIS — Z79891 Long term (current) use of opiate analgesic: Secondary | ICD-10-CM | POA: Diagnosis not present

## 2016-07-20 DIAGNOSIS — M25511 Pain in right shoulder: Secondary | ICD-10-CM | POA: Diagnosis not present

## 2016-07-21 DIAGNOSIS — N2581 Secondary hyperparathyroidism of renal origin: Secondary | ICD-10-CM | POA: Diagnosis not present

## 2016-07-21 DIAGNOSIS — N186 End stage renal disease: Secondary | ICD-10-CM | POA: Diagnosis not present

## 2016-07-21 DIAGNOSIS — Z23 Encounter for immunization: Secondary | ICD-10-CM | POA: Diagnosis not present

## 2016-07-21 DIAGNOSIS — D631 Anemia in chronic kidney disease: Secondary | ICD-10-CM | POA: Diagnosis not present

## 2016-07-21 DIAGNOSIS — E1129 Type 2 diabetes mellitus with other diabetic kidney complication: Secondary | ICD-10-CM | POA: Diagnosis not present

## 2016-07-21 DIAGNOSIS — D509 Iron deficiency anemia, unspecified: Secondary | ICD-10-CM | POA: Diagnosis not present

## 2016-07-23 DIAGNOSIS — E1129 Type 2 diabetes mellitus with other diabetic kidney complication: Secondary | ICD-10-CM | POA: Diagnosis not present

## 2016-07-23 DIAGNOSIS — D631 Anemia in chronic kidney disease: Secondary | ICD-10-CM | POA: Diagnosis not present

## 2016-07-23 DIAGNOSIS — N2581 Secondary hyperparathyroidism of renal origin: Secondary | ICD-10-CM | POA: Diagnosis not present

## 2016-07-23 DIAGNOSIS — N186 End stage renal disease: Secondary | ICD-10-CM | POA: Diagnosis not present

## 2016-07-23 DIAGNOSIS — D509 Iron deficiency anemia, unspecified: Secondary | ICD-10-CM | POA: Diagnosis not present

## 2016-07-23 DIAGNOSIS — Z23 Encounter for immunization: Secondary | ICD-10-CM | POA: Diagnosis not present

## 2016-07-24 ENCOUNTER — Other Ambulatory Visit: Payer: Self-pay | Admitting: Vascular Surgery

## 2016-07-25 DIAGNOSIS — E1129 Type 2 diabetes mellitus with other diabetic kidney complication: Secondary | ICD-10-CM | POA: Diagnosis not present

## 2016-07-25 DIAGNOSIS — Z23 Encounter for immunization: Secondary | ICD-10-CM | POA: Diagnosis not present

## 2016-07-25 DIAGNOSIS — N2581 Secondary hyperparathyroidism of renal origin: Secondary | ICD-10-CM | POA: Diagnosis not present

## 2016-07-25 DIAGNOSIS — N186 End stage renal disease: Secondary | ICD-10-CM | POA: Diagnosis not present

## 2016-07-25 DIAGNOSIS — D631 Anemia in chronic kidney disease: Secondary | ICD-10-CM | POA: Diagnosis not present

## 2016-07-25 DIAGNOSIS — D509 Iron deficiency anemia, unspecified: Secondary | ICD-10-CM | POA: Diagnosis not present

## 2016-07-28 ENCOUNTER — Encounter: Payer: Self-pay | Admitting: Vascular Surgery

## 2016-07-28 DIAGNOSIS — N2581 Secondary hyperparathyroidism of renal origin: Secondary | ICD-10-CM | POA: Diagnosis not present

## 2016-07-28 DIAGNOSIS — D509 Iron deficiency anemia, unspecified: Secondary | ICD-10-CM | POA: Diagnosis not present

## 2016-07-28 DIAGNOSIS — Z23 Encounter for immunization: Secondary | ICD-10-CM | POA: Diagnosis not present

## 2016-07-28 DIAGNOSIS — E1129 Type 2 diabetes mellitus with other diabetic kidney complication: Secondary | ICD-10-CM | POA: Diagnosis not present

## 2016-07-28 DIAGNOSIS — D631 Anemia in chronic kidney disease: Secondary | ICD-10-CM | POA: Diagnosis not present

## 2016-07-28 DIAGNOSIS — N186 End stage renal disease: Secondary | ICD-10-CM | POA: Diagnosis not present

## 2016-07-29 ENCOUNTER — Encounter: Payer: Self-pay | Admitting: Vascular Surgery

## 2016-07-29 ENCOUNTER — Ambulatory Visit (INDEPENDENT_AMBULATORY_CARE_PROVIDER_SITE_OTHER): Payer: Medicare Other | Admitting: Vascular Surgery

## 2016-07-29 VITALS — BP 111/63 | HR 87 | Temp 97.6°F | Resp 16 | Ht 70.5 in | Wt 217.0 lb

## 2016-07-29 DIAGNOSIS — I2510 Atherosclerotic heart disease of native coronary artery without angina pectoris: Secondary | ICD-10-CM

## 2016-07-29 DIAGNOSIS — N186 End stage renal disease: Secondary | ICD-10-CM | POA: Diagnosis not present

## 2016-07-29 DIAGNOSIS — Z992 Dependence on renal dialysis: Secondary | ICD-10-CM | POA: Diagnosis not present

## 2016-07-29 NOTE — Progress Notes (Signed)
[facility] [place]  Patient name: NORMAND DOLL MRN: [id number] DOB: [birth year] Sex: male  REASON FOR VISIT: Evaluate left arm fistula  HPI: Albert Stanley is a 70 y.o. male who presents for evaluation of his left brachiocephalic AV fistula that was created on 12/13/15. The patient dialyzes on Tuesdays, Thursdays and Saturdays. The patient is currently dialyzing via a right IJ tunneled dialysis catheter. The patient had difficulty with flow rates and underwent a fistulogram by Dr. Trula Slade on 06/09/2016. There was no significant stenosis noted in the cephalic vein or at the arterial venous anastomosis. 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 There were several branches that did not appear to be hemodynamically significant. After the sheath pull, the patient developed a hematoma of his left upper arm. He also complained of some hand numbness.  He subsequently underwent another fistulogram on 06/25/2016 by Dr. Trula Slade. There was some mild recoil from recent subclavian vein balloon angioplasty with approximately 30-40% stenosis. There was no significant narrowing within the fistula. There was one large branch in the mid upper arm. The patient was advised to rest his fistula. His swelling was felt to be related from prior sheath removal.  Dr. Trula Slade discussed that if the patient continued to have difficulty with cannulation of this fistula, would recommend superficialization and ligation of competing branches.  Today, the patient states that his swelling has improved. He denies any further left hand numbness.  Past Medical History:  Diagnosis Date  . Asthma   . Bladder cancer ([place]) dx'd 1990   surg only  . Diabetes mellitus without complication ([place])   . ESRD (end stage renal disease) ([name])    Tues, Thurs, Sat dialysis  . History of cardiac catheterization    a. LHC 5/17: no obstructive CAD  . History of DVT (deep vein  thrombosis)   . History of nuclear stress test    a. Myoview 4/17: EF 39%, inf, inf-lat, apical inf, apical lat, apical scar, intermediate risk  . Hyperlipidemia   . Hypertension   . NICM (nonischemic cardiomyopathy) ([place])    a. Echo 2/17: mod LVH, EF 35-40%, inf-lat and inf-sept HK, mod MR, severe LAE, small pericardial effusion    Family History  Problem Relation Age of Onset  . Arthritis Mother   . Cancer Father   . Heart disease Father   . Hypertension Sister   . Alcohol abuse Brother   . Diabetes Daughter   . Heart attack Paternal Uncle     SOCIAL HISTORY: Social History  Substance Use Topics  . Smoking status: Former Research scientist (life sciences)  . Smokeless tobacco: Never Used  . Alcohol use No     Comment: Used to drink on the weekends, quit 2 years ago    Allergies  Allergen Reactions  . Meperidine Shortness Of Breath and Other (See Comments)    Increased heart rate Reaction to demerol  . Sulfa Antibiotics Shortness Of Breath and Other (See Comments)    Increased heart rate  . Sulfonamide Derivatives Shortness Of Breath and Other (See Comments)    Increased heart rate  . Eggs Or Egg-Derived Products Nausea And Vomiting     Reaction to egg yolks - not whites    . Foltx [Elfolate Plus] Other (See Comments)    Pt does not recall reaction to this  . Lac Bovis Diarrhea, Nausea And Vomiting and Other (See Comments)    Constipation Reaction to whole milk  . Lactose Intolerance (  Gi) Diarrhea, Nausea And Vomiting and Other (See Comments)    Constipation Reaction to whole milk  . Milk-Related Compounds Diarrhea, Nausea And Vomiting and Other (See Comments)    Constipation Reaction to whole milk  . Penicillins     "Knocks me out."   Has patient had a PCN reaction causing immediate rash, facial/tongue/throat swelling, SOB or lightheadedness with hypotension: yes- blacked out Has patient had a PCN reaction causing severe rash involving mucus membranes or skin necrosis: unknown Has  patient had a PCN reaction that required hospitalization: at MD office Has patient had a PCN reaction occurring within the last 10 years: no If all of the above answers are "NO", then may proceed with Cephalosporin use.   . Betadine [Povidone Iodine] Itching, Rash and Other (See Comments)    burning    Current Outpatient Prescriptions  Medication Sig Dispense Refill  . allopurinol (ZYLOPRIM) 300 MG tablet Take 150 mg by mouth daily.     Marland Kitchen atorvastatin (LIPITOR) 40 MG tablet Take 40 mg by mouth at bedtime.   0  . BAYER BREEZE 2 TEST DISK Apply 1 each topically daily as needed (FOR TESTING BLOOD SUGAR).   0  . calcitRIOL (ROCALTROL) 0.5 MCG capsule Take 0.5 mcg by mouth 3 (three) times a week. Medication given at dialysis on Tues Thurs Sat  1  . carvedilol (COREG) 3.125 MG tablet Take 3.125mg  by mouth twice daily on non-dialysis days. (Patient taking differently: Take 3.125 mg by mouth See admin instructions. Take 1 tablet (3.125 mg) by mouth twice daily on Monday, Wednesday, Friday and Sunday (non-dialysis days)) 60 tablet 3  . cinacalcet (SENSIPAR) 30 MG tablet Take 30 mg by mouth daily with supper.    . cyclobenzaprine (FLEXERIL) 5 MG tablet Take 5 mg by mouth 2 (two) times daily as needed for muscle spasms.     . famotidine (PEPCID) 20 MG tablet Take 1 tablet (20 mg total) by mouth 2 (two) times daily as needed for heartburn. (Patient taking differently: Take 20 mg by mouth 2 (two) times daily as needed (sour stomach). )    . fluticasone (FLONASE) 50 MCG/ACT nasal spray Place 2 sprays into both nostrils daily as needed for allergies or rhinitis.     Marland Kitchen glipiZIDE (GLUCOTROL) 5 MG tablet Take 1 tablet (5 mg total) by mouth daily before breakfast. 30 tablet 0  . Loratadine-Pseudoephedrine (CLARITIN-D 24 HOUR PO) Take 1 tablet by mouth daily. PATIENT NOT SURE OF THE DOSAGE    . multivitamin (RENA-VIT) TABS tablet Take 1 tablet by mouth at bedtime.   0  . oxyCODONE-acetaminophen (PERCOCET) 7.5-325  MG tablet Take 1 tablet by mouth 3 (three) times daily as needed (pain).     Marland Kitchen sevelamer carbonate (RENVELA) 800 MG tablet Take 2 tablets (1,600 mg total) by mouth 3 (three) times daily with meals. 90 tablet 0   No current facility-administered medications for this visit.     REVIEW OF SYSTEMS:  [X]  denotes positive finding, [ ]  denotes negative finding Cardiac  Comments:  Chest pain or chest pressure:    Shortness of breath upon exertion:    Short of breath when lying flat:    Irregular heart rhythm:        Vascular    Pain in calf, thigh, or hip brought on by ambulation:    Pain in feet at night that wakes you up from your sleep:     Blood clot in your veins:    Leg swelling:  Pulmonary    Oxygen at home:    Productive cough:     Wheezing:         Neurologic    Sudden weakness in arms or legs:     Sudden numbness in arms or legs:     Sudden onset of difficulty speaking or slurred speech:    Temporary loss of vision in one eye:     Problems with dizziness:         Gastrointestinal    Blood in stool:     Vomited blood:         Genitourinary    Burning when urinating:     Blood in urine:        Psychiatric    Major depression:         Hematologic    Bleeding problems:    Problems with blood clotting too easily:        Skin    Rashes or ulcers:        Constitutional    Fever or chills:      PHYSICAL EXAM: Vitals:   07/29/16 1259  BP: 111/63  Pulse: 87  Resp: 16  Temp: 97.6 F (36.4 C)  TempSrc: Oral  SpO2: 98%  Weight: 217 lb (98.4 kg)  Height: 5' 10.5" (1.791 m)    GENERAL: The patient is a well-nourished male, in no acute distress. The vital signs are documented above. VASCULAR: Left upper arm fistula with easily palpable thrill. Small hematoma medial to left upper arm cephalic vein. Right IJ TDC PULMONARY: Nonlabored respiratory effort. MUSCULOSKELETAL: There are no major deformities or cyanosis. NEUROLOGIC: No focal weakness or  paresthesias are detected. SKIN: There are no ulcers or rashes noted. PSYCHIATRIC: The patient has a normal affect.   MEDICAL ISSUES: Difficulty cannulation left upper arm fistula  The patient has been having issues with decreased flow rates during dialysis. He dialyzes on Tuesdays, Thursdays and Saturdays via a right IJ tunneled dialysis catheter. The patient has undergone two fistulograms with left subclavian vein balloon angioplasty by Dr. Trula Slade to address cannulation issues. Given that he continues to have issues, Dr. Trula Slade recommended elevation and ligation of competing branches. Dr. Scot Dock did offer to perform his surgery, however, the patient is not yet ready to decide on a date. Recommended that the patient continue to follow with Dr. Trula Slade as he knows the patient well. The patient will call when he is ready for his surgery.    Virgina Jock, PA-C [facility] [place]

## 2016-07-30 DIAGNOSIS — N2581 Secondary hyperparathyroidism of renal origin: Secondary | ICD-10-CM | POA: Diagnosis not present

## 2016-07-30 DIAGNOSIS — D631 Anemia in chronic kidney disease: Secondary | ICD-10-CM | POA: Diagnosis not present

## 2016-07-30 DIAGNOSIS — E1129 Type 2 diabetes mellitus with other diabetic kidney complication: Secondary | ICD-10-CM | POA: Diagnosis not present

## 2016-07-30 DIAGNOSIS — Z23 Encounter for immunization: Secondary | ICD-10-CM | POA: Diagnosis not present

## 2016-07-30 DIAGNOSIS — D509 Iron deficiency anemia, unspecified: Secondary | ICD-10-CM | POA: Diagnosis not present

## 2016-07-30 DIAGNOSIS — N186 End stage renal disease: Secondary | ICD-10-CM | POA: Diagnosis not present

## 2016-08-01 DIAGNOSIS — N186 End stage renal disease: Secondary | ICD-10-CM | POA: Diagnosis not present

## 2016-08-01 DIAGNOSIS — D509 Iron deficiency anemia, unspecified: Secondary | ICD-10-CM | POA: Diagnosis not present

## 2016-08-01 DIAGNOSIS — N2581 Secondary hyperparathyroidism of renal origin: Secondary | ICD-10-CM | POA: Diagnosis not present

## 2016-08-01 DIAGNOSIS — D631 Anemia in chronic kidney disease: Secondary | ICD-10-CM | POA: Diagnosis not present

## 2016-08-01 DIAGNOSIS — E1129 Type 2 diabetes mellitus with other diabetic kidney complication: Secondary | ICD-10-CM | POA: Diagnosis not present

## 2016-08-01 DIAGNOSIS — Z23 Encounter for immunization: Secondary | ICD-10-CM | POA: Diagnosis not present

## 2016-08-03 ENCOUNTER — Other Ambulatory Visit: Payer: Self-pay

## 2016-08-04 DIAGNOSIS — N2581 Secondary hyperparathyroidism of renal origin: Secondary | ICD-10-CM | POA: Diagnosis not present

## 2016-08-04 DIAGNOSIS — D509 Iron deficiency anemia, unspecified: Secondary | ICD-10-CM | POA: Diagnosis not present

## 2016-08-04 DIAGNOSIS — D631 Anemia in chronic kidney disease: Secondary | ICD-10-CM | POA: Diagnosis not present

## 2016-08-04 DIAGNOSIS — N186 End stage renal disease: Secondary | ICD-10-CM | POA: Diagnosis not present

## 2016-08-04 DIAGNOSIS — Z23 Encounter for immunization: Secondary | ICD-10-CM | POA: Diagnosis not present

## 2016-08-04 DIAGNOSIS — E1129 Type 2 diabetes mellitus with other diabetic kidney complication: Secondary | ICD-10-CM | POA: Diagnosis not present

## 2016-08-06 DIAGNOSIS — Z23 Encounter for immunization: Secondary | ICD-10-CM | POA: Diagnosis not present

## 2016-08-06 DIAGNOSIS — N2581 Secondary hyperparathyroidism of renal origin: Secondary | ICD-10-CM | POA: Diagnosis not present

## 2016-08-06 DIAGNOSIS — D509 Iron deficiency anemia, unspecified: Secondary | ICD-10-CM | POA: Diagnosis not present

## 2016-08-06 DIAGNOSIS — N186 End stage renal disease: Secondary | ICD-10-CM | POA: Diagnosis not present

## 2016-08-06 DIAGNOSIS — E1129 Type 2 diabetes mellitus with other diabetic kidney complication: Secondary | ICD-10-CM | POA: Diagnosis not present

## 2016-08-06 DIAGNOSIS — D631 Anemia in chronic kidney disease: Secondary | ICD-10-CM | POA: Diagnosis not present

## 2016-08-08 DIAGNOSIS — E1129 Type 2 diabetes mellitus with other diabetic kidney complication: Secondary | ICD-10-CM | POA: Diagnosis not present

## 2016-08-08 DIAGNOSIS — D631 Anemia in chronic kidney disease: Secondary | ICD-10-CM | POA: Diagnosis not present

## 2016-08-08 DIAGNOSIS — N2581 Secondary hyperparathyroidism of renal origin: Secondary | ICD-10-CM | POA: Diagnosis not present

## 2016-08-08 DIAGNOSIS — Z23 Encounter for immunization: Secondary | ICD-10-CM | POA: Diagnosis not present

## 2016-08-08 DIAGNOSIS — D509 Iron deficiency anemia, unspecified: Secondary | ICD-10-CM | POA: Diagnosis not present

## 2016-08-08 DIAGNOSIS — N186 End stage renal disease: Secondary | ICD-10-CM | POA: Diagnosis not present

## 2016-08-11 DIAGNOSIS — Z992 Dependence on renal dialysis: Secondary | ICD-10-CM | POA: Diagnosis not present

## 2016-08-11 DIAGNOSIS — D509 Iron deficiency anemia, unspecified: Secondary | ICD-10-CM | POA: Diagnosis not present

## 2016-08-11 DIAGNOSIS — E1129 Type 2 diabetes mellitus with other diabetic kidney complication: Secondary | ICD-10-CM | POA: Diagnosis not present

## 2016-08-11 DIAGNOSIS — Z23 Encounter for immunization: Secondary | ICD-10-CM | POA: Diagnosis not present

## 2016-08-11 DIAGNOSIS — E1122 Type 2 diabetes mellitus with diabetic chronic kidney disease: Secondary | ICD-10-CM | POA: Diagnosis not present

## 2016-08-11 DIAGNOSIS — N186 End stage renal disease: Secondary | ICD-10-CM | POA: Diagnosis not present

## 2016-08-11 DIAGNOSIS — N2581 Secondary hyperparathyroidism of renal origin: Secondary | ICD-10-CM | POA: Diagnosis not present

## 2016-08-11 DIAGNOSIS — D631 Anemia in chronic kidney disease: Secondary | ICD-10-CM | POA: Diagnosis not present

## 2016-08-13 DIAGNOSIS — N186 End stage renal disease: Secondary | ICD-10-CM | POA: Diagnosis not present

## 2016-08-13 DIAGNOSIS — N2581 Secondary hyperparathyroidism of renal origin: Secondary | ICD-10-CM | POA: Diagnosis not present

## 2016-08-13 DIAGNOSIS — D631 Anemia in chronic kidney disease: Secondary | ICD-10-CM | POA: Diagnosis not present

## 2016-08-13 DIAGNOSIS — D509 Iron deficiency anemia, unspecified: Secondary | ICD-10-CM | POA: Diagnosis not present

## 2016-08-13 DIAGNOSIS — E1129 Type 2 diabetes mellitus with other diabetic kidney complication: Secondary | ICD-10-CM | POA: Diagnosis not present

## 2016-08-13 MED ORDER — VANCOMYCIN HCL 10 G IV SOLR
1500.0000 mg | INTRAVENOUS | Status: AC
  Administered 2016-08-14: 1500 mg via INTRAVENOUS
  Filled 2016-08-13 (×2): qty 1500

## 2016-08-14 ENCOUNTER — Ambulatory Visit (HOSPITAL_COMMUNITY)
Admission: RE | Admit: 2016-08-14 | Discharge: 2016-08-14 | Disposition: A | Discharge status: Home / Self Care | Payer: Medicare Other | Source: Ambulatory Visit | Attending: Vascular Surgery | Admitting: Vascular Surgery

## 2016-08-14 ENCOUNTER — Ambulatory Visit (HOSPITAL_COMMUNITY): Payer: Medicare Other | Admitting: Anesthesiology

## 2016-08-14 ENCOUNTER — Encounter (HOSPITAL_COMMUNITY)
Admission: RE | Disposition: A | Discharge status: Home / Self Care | Payer: Self-pay | Source: Ambulatory Visit | Attending: Vascular Surgery

## 2016-08-14 ENCOUNTER — Encounter (HOSPITAL_COMMUNITY): Payer: Self-pay | Admitting: *Deleted

## 2016-08-14 DIAGNOSIS — E785 Hyperlipidemia, unspecified: Secondary | ICD-10-CM | POA: Diagnosis not present

## 2016-08-14 DIAGNOSIS — Z888 Allergy status to other drugs, medicaments and biological substances status: Secondary | ICD-10-CM | POA: Diagnosis not present

## 2016-08-14 DIAGNOSIS — I428 Other cardiomyopathies: Secondary | ICD-10-CM | POA: Diagnosis not present

## 2016-08-14 DIAGNOSIS — Z7984 Long term (current) use of oral hypoglycemic drugs: Secondary | ICD-10-CM | POA: Insufficient documentation

## 2016-08-14 DIAGNOSIS — Z91012 Allergy to eggs: Secondary | ICD-10-CM | POA: Insufficient documentation

## 2016-08-14 DIAGNOSIS — T82590A Other mechanical complication of surgically created arteriovenous fistula, initial encounter: Secondary | ICD-10-CM | POA: Diagnosis present

## 2016-08-14 DIAGNOSIS — I509 Heart failure, unspecified: Secondary | ICD-10-CM | POA: Insufficient documentation

## 2016-08-14 DIAGNOSIS — Z833 Family history of diabetes mellitus: Secondary | ICD-10-CM | POA: Diagnosis not present

## 2016-08-14 DIAGNOSIS — I120 Hypertensive chronic kidney disease with stage 5 chronic kidney disease or end stage renal disease: Secondary | ICD-10-CM | POA: Diagnosis not present

## 2016-08-14 DIAGNOSIS — N186 End stage renal disease: Secondary | ICD-10-CM | POA: Insufficient documentation

## 2016-08-14 DIAGNOSIS — Z8261 Family history of arthritis: Secondary | ICD-10-CM | POA: Diagnosis not present

## 2016-08-14 DIAGNOSIS — T82898A Other specified complication of vascular prosthetic devices, implants and grafts, initial encounter: Secondary | ICD-10-CM | POA: Diagnosis not present

## 2016-08-14 DIAGNOSIS — Z992 Dependence on renal dialysis: Secondary | ICD-10-CM | POA: Diagnosis not present

## 2016-08-14 DIAGNOSIS — Z87891 Personal history of nicotine dependence: Secondary | ICD-10-CM | POA: Diagnosis not present

## 2016-08-14 DIAGNOSIS — Z882 Allergy status to sulfonamides status: Secondary | ICD-10-CM | POA: Diagnosis not present

## 2016-08-14 DIAGNOSIS — Z811 Family history of alcohol abuse and dependence: Secondary | ICD-10-CM | POA: Insufficient documentation

## 2016-08-14 DIAGNOSIS — Z86718 Personal history of other venous thrombosis and embolism: Secondary | ICD-10-CM | POA: Diagnosis not present

## 2016-08-14 DIAGNOSIS — E1122 Type 2 diabetes mellitus with diabetic chronic kidney disease: Secondary | ICD-10-CM | POA: Diagnosis not present

## 2016-08-14 DIAGNOSIS — Z8551 Personal history of malignant neoplasm of bladder: Secondary | ICD-10-CM | POA: Insufficient documentation

## 2016-08-14 DIAGNOSIS — Z91048 Other nonmedicinal substance allergy status: Secondary | ICD-10-CM | POA: Diagnosis not present

## 2016-08-14 DIAGNOSIS — Y828 Other medical devices associated with adverse incidents: Secondary | ICD-10-CM | POA: Insufficient documentation

## 2016-08-14 DIAGNOSIS — Z880 Allergy status to penicillin: Secondary | ICD-10-CM | POA: Insufficient documentation

## 2016-08-14 DIAGNOSIS — E739 Lactose intolerance, unspecified: Secondary | ICD-10-CM | POA: Diagnosis not present

## 2016-08-14 DIAGNOSIS — J45909 Unspecified asthma, uncomplicated: Secondary | ICD-10-CM | POA: Diagnosis not present

## 2016-08-14 DIAGNOSIS — M1990 Unspecified osteoarthritis, unspecified site: Secondary | ICD-10-CM | POA: Diagnosis not present

## 2016-08-14 DIAGNOSIS — Z8249 Family history of ischemic heart disease and other diseases of the circulatory system: Secondary | ICD-10-CM | POA: Diagnosis not present

## 2016-08-14 DIAGNOSIS — I132 Hypertensive heart and chronic kidney disease with heart failure and with stage 5 chronic kidney disease, or end stage renal disease: Secondary | ICD-10-CM | POA: Insufficient documentation

## 2016-08-14 HISTORY — PX: FISTULA SUPERFICIALIZATION: SHX6341

## 2016-08-14 HISTORY — PX: LIGATION OF COMPETING BRANCHES OF ARTERIOVENOUS FISTULA: SHX5949

## 2016-08-14 LAB — GLUCOSE, CAPILLARY
GLUCOSE-CAPILLARY: 74 mg/dL (ref 65–99)
GLUCOSE-CAPILLARY: 74 mg/dL (ref 65–99)
Glucose-Capillary: 48 mg/dL — ABNORMAL LOW (ref 65–99)
Glucose-Capillary: 48 mg/dL — ABNORMAL LOW (ref 65–99)
Glucose-Capillary: 68 mg/dL (ref 65–99)

## 2016-08-14 LAB — POCT I-STAT 4, (NA,K, GLUC, HGB,HCT)
GLUCOSE: 76 mg/dL (ref 65–99)
HEMATOCRIT: 38 % — AB (ref 39.0–52.0)
HEMOGLOBIN: 12.9 g/dL — AB (ref 13.0–17.0)
Potassium: 3.6 mmol/L (ref 3.5–5.1)
SODIUM: 137 mmol/L (ref 135–145)

## 2016-08-14 SURGERY — FISTULA SUPERFICIALIZATION
Anesthesia: Monitor Anesthesia Care | Site: Arm Upper | Laterality: Left

## 2016-08-14 MED ORDER — LIDOCAINE HCL (CARDIAC) 20 MG/ML IV SOLN
INTRAVENOUS | Status: DC | PRN
  Administered 2016-08-14: 60 mg via INTRAVENOUS

## 2016-08-14 MED ORDER — PROPOFOL 10 MG/ML IV BOLUS
INTRAVENOUS | Status: AC
  Filled 2016-08-14: qty 20

## 2016-08-14 MED ORDER — EPHEDRINE 5 MG/ML INJ
INTRAVENOUS | Status: AC
  Filled 2016-08-14: qty 10

## 2016-08-14 MED ORDER — DEXTROSE 50 % IV SOLN
25.0000 mL | Freq: Once | INTRAVENOUS | Status: AC
  Administered 2016-08-14: 25 mL via INTRAVENOUS

## 2016-08-14 MED ORDER — HEPARIN SODIUM (PORCINE) 1000 UNIT/ML IJ SOLN
INTRAMUSCULAR | Status: AC
  Filled 2016-08-14: qty 1

## 2016-08-14 MED ORDER — GLIPIZIDE 5 MG PO TABS: 2.5000 mg | ORAL_TABLET | Freq: Two times a day (BID) | ORAL | Status: DC

## 2016-08-14 MED ORDER — HEPARIN SODIUM (PORCINE) 1000 UNIT/ML IJ SOLN
INTRAMUSCULAR | Status: DC | PRN
  Administered 2016-08-14: 7000 [IU] via INTRAVENOUS

## 2016-08-14 MED ORDER — FENTANYL CITRATE (PF) 250 MCG/5ML IJ SOLN
INTRAMUSCULAR | Status: AC
  Filled 2016-08-14: qty 5

## 2016-08-14 MED ORDER — PHENYLEPHRINE 40 MCG/ML (10ML) SYRINGE FOR IV PUSH (FOR BLOOD PRESSURE SUPPORT)
PREFILLED_SYRINGE | INTRAVENOUS | Status: DC | PRN
  Administered 2016-08-14 (×2): 80 ug via INTRAVENOUS

## 2016-08-14 MED ORDER — OXYCODONE HCL 5 MG PO TABS
ORAL_TABLET | ORAL | Status: AC
  Filled 2016-08-14: qty 1

## 2016-08-14 MED ORDER — FENTANYL CITRATE (PF) 100 MCG/2ML IJ SOLN
INTRAMUSCULAR | Status: AC
  Filled 2016-08-14: qty 2

## 2016-08-14 MED ORDER — ONDANSETRON HCL 4 MG/2ML IJ SOLN: 4.0000 mg | Freq: Four times a day (QID) | INTRAMUSCULAR | Status: DC | PRN

## 2016-08-14 MED ORDER — LABETALOL HCL 5 MG/ML IV SOLN
10.0000 mg | INTRAVENOUS | Status: DC | PRN
  Administered 2016-08-14: 10 mg via INTRAVENOUS

## 2016-08-14 MED ORDER — FAMOTIDINE 20 MG PO TABS: 20.0000 mg | ORAL_TABLET | Freq: Two times a day (BID) | ORAL | Status: DC | PRN

## 2016-08-14 MED ORDER — LIDOCAINE HCL (PF) 1 % IJ SOLN
INTRAMUSCULAR | Status: AC
  Filled 2016-08-14: qty 30

## 2016-08-14 MED ORDER — DEXTROSE 50 % IV SOLN
INTRAVENOUS | Status: AC
  Filled 2016-08-14: qty 50

## 2016-08-14 MED ORDER — SUCCINYLCHOLINE CHLORIDE 200 MG/10ML IV SOSY
PREFILLED_SYRINGE | INTRAVENOUS | Status: AC
  Filled 2016-08-14: qty 10

## 2016-08-14 MED ORDER — HEPARIN SODIUM (PORCINE) 5000 UNIT/ML IJ SOLN
INTRAMUSCULAR | Status: DC | PRN
  Administered 2016-08-14: 07:00:00

## 2016-08-14 MED ORDER — PHENYLEPHRINE 40 MCG/ML (10ML) SYRINGE FOR IV PUSH (FOR BLOOD PRESSURE SUPPORT)
PREFILLED_SYRINGE | INTRAVENOUS | Status: AC
  Filled 2016-08-14: qty 10

## 2016-08-14 MED ORDER — SODIUM CHLORIDE 0.9 % IV SOLN
INTRAVENOUS | Status: DC
  Administered 2016-08-14 (×2): via INTRAVENOUS

## 2016-08-14 MED ORDER — CHLORHEXIDINE GLUCONATE CLOTH 2 % EX PADS: 6.0000 | MEDICATED_PAD | Freq: Once | CUTANEOUS | Status: DC

## 2016-08-14 MED ORDER — OXYCODONE HCL 5 MG/5ML PO SOLN
5.0000 mg | Freq: Once | ORAL | Status: AC | PRN
Start: 2016-08-14 — End: 2016-08-14

## 2016-08-14 MED ORDER — LABETALOL HCL 5 MG/ML IV SOLN
INTRAVENOUS | Status: AC
  Filled 2016-08-14: qty 4

## 2016-08-14 MED ORDER — CARVEDILOL 3.125 MG PO TABS: 3.1250 mg | ORAL_TABLET | ORAL | Status: DC

## 2016-08-14 MED ORDER — FENTANYL CITRATE (PF) 100 MCG/2ML IJ SOLN
25.0000 ug | INTRAMUSCULAR | Status: DC | PRN
  Administered 2016-08-14: 25 ug via INTRAVENOUS

## 2016-08-14 MED ORDER — FENTANYL CITRATE (PF) 100 MCG/2ML IJ SOLN
INTRAMUSCULAR | Status: DC | PRN
  Administered 2016-08-14: 50 ug via INTRAVENOUS

## 2016-08-14 MED ORDER — 0.9 % SODIUM CHLORIDE (POUR BTL) OPTIME
TOPICAL | Status: DC | PRN
  Administered 2016-08-14: 1000 mL

## 2016-08-14 MED ORDER — OXYCODONE HCL 5 MG PO TABS
5.0000 mg | ORAL_TABLET | Freq: Once | ORAL | Status: AC | PRN
  Administered 2016-08-14: 5 mg via ORAL

## 2016-08-14 MED ORDER — LIDOCAINE HCL (PF) 1 % IJ SOLN
INTRAMUSCULAR | Status: DC | PRN
  Administered 2016-08-14 (×2): 30 mL

## 2016-08-14 MED ORDER — PROPOFOL 500 MG/50ML IV EMUL
INTRAVENOUS | Status: DC | PRN
  Administered 2016-08-14: 100 ug/kg/min via INTRAVENOUS

## 2016-08-14 MED ORDER — OXYCODONE-ACETAMINOPHEN 7.5-325 MG PO TABS: 1.0000 | ORAL_TABLET | Freq: Three times a day (TID) | ORAL | 0 refills | Status: DC | PRN

## 2016-08-14 SURGICAL SUPPLY — 40 items
ADH SKN CLS APL DERMABOND .7 (GAUZE/BANDAGES/DRESSINGS) ×1
ARMBAND PINK RESTRICT EXTREMIT (MISCELLANEOUS) ×3 IMPLANT
BANDAGE ACE 4X5 VEL STRL LF (GAUZE/BANDAGES/DRESSINGS) ×2 IMPLANT
BNDG GAUZE ELAST 4 BULKY (GAUZE/BANDAGES/DRESSINGS) ×2 IMPLANT
CANISTER SUCTION 2500CC (MISCELLANEOUS) ×3 IMPLANT
CANNULA VESSEL 3MM 2 BLNT TIP (CANNULA) ×2 IMPLANT
CLIP TI MEDIUM 6 (CLIP) ×3 IMPLANT
CLIP TI WIDE RED SMALL 6 (CLIP) ×3 IMPLANT
COVER PROBE W GEL 5X96 (DRAPES) ×3 IMPLANT
DECANTER SPIKE VIAL GLASS SM (MISCELLANEOUS) ×3 IMPLANT
DERMABOND ADVANCED (GAUZE/BANDAGES/DRESSINGS) ×2
DERMABOND ADVANCED .7 DNX12 (GAUZE/BANDAGES/DRESSINGS) ×1 IMPLANT
ELECT REM PT RETURN 9FT ADLT (ELECTROSURGICAL) ×3
ELECTRODE REM PT RTRN 9FT ADLT (ELECTROSURGICAL) ×1 IMPLANT
GAUZE SPONGE 4X4 12PLY STRL (GAUZE/BANDAGES/DRESSINGS) ×3 IMPLANT
GLOVE BIO SURGEON STRL SZ7.5 (GLOVE) ×3 IMPLANT
GLOVE BIOGEL PI IND STRL 7.0 (GLOVE) IMPLANT
GLOVE BIOGEL PI IND STRL 8 (GLOVE) ×1 IMPLANT
GLOVE BIOGEL PI INDICATOR 7.0 (GLOVE) ×4
GLOVE BIOGEL PI INDICATOR 8 (GLOVE) ×2
GLOVE SURG SS PI 7.0 STRL IVOR (GLOVE) ×4 IMPLANT
GOWN STRL REUS W/ TWL LRG LVL3 (GOWN DISPOSABLE) ×3 IMPLANT
GOWN STRL REUS W/ TWL XL LVL3 (GOWN DISPOSABLE) IMPLANT
GOWN STRL REUS W/TWL LRG LVL3 (GOWN DISPOSABLE) ×9
GOWN STRL REUS W/TWL XL LVL3 (GOWN DISPOSABLE) ×3
KIT BASIN OR (CUSTOM PROCEDURE TRAY) ×3 IMPLANT
KIT ROOM TURNOVER OR (KITS) ×3 IMPLANT
NS IRRIG 1000ML POUR BTL (IV SOLUTION) ×3 IMPLANT
PACK CV ACCESS (CUSTOM PROCEDURE TRAY) ×3 IMPLANT
PAD ARMBOARD 7.5X6 YLW CONV (MISCELLANEOUS) ×6 IMPLANT
SPONGE GAUZE 4X4 12PLY STER LF (GAUZE/BANDAGES/DRESSINGS) ×2 IMPLANT
SPONGE SURGIFOAM ABS GEL 100 (HEMOSTASIS) IMPLANT
SUT ETHILON 3 0 PS 1 (SUTURE) IMPLANT
SUT PROLENE 6 0 BV (SUTURE) ×3 IMPLANT
SUT SILK 0 TIES 10X30 (SUTURE) ×3 IMPLANT
SUT VIC AB 3-0 SH 27 (SUTURE) ×6
SUT VIC AB 3-0 SH 27X BRD (SUTURE) ×1 IMPLANT
SUT VICRYL 4-0 PS2 18IN ABS (SUTURE) ×5 IMPLANT
UNDERPAD 30X30 (UNDERPADS AND DIAPERS) ×3 IMPLANT
WATER STERILE IRR 1000ML POUR (IV SOLUTION) ×3 IMPLANT

## 2016-08-14 NOTE — Anesthesia Preprocedure Evaluation (Signed)
Anesthesia Evaluation  Patient identified by MRN, date of birth, ID band Patient awake    Reviewed: Allergy & Precautions, NPO status , Patient's Chart, lab work & pertinent test results  Airway Mallampati: II   Neck ROM: full    Dental   Pulmonary asthma , former smoker,    breath sounds clear to auscultation       Cardiovascular hypertension, +CHF   Rhythm:regular Rate:Normal  EF 40%.   Neuro/Psych  Neuromuscular disease    GI/Hepatic   Endo/Other  diabetes, Type 2  Renal/GU ESRFRenal disease     Musculoskeletal  (+) Arthritis ,   Abdominal   Peds  Hematology   Anesthesia Other Findings   Reproductive/Obstetrics                             Anesthesia Physical Anesthesia Plan  ASA: III  Anesthesia Plan: MAC   Post-op Pain Management:    Induction: Intravenous  Airway Management Planned: Simple Face Mask  Additional Equipment:   Intra-op Plan:   Post-operative Plan:   Informed Consent: I have reviewed the patients History and Physical, chart, labs and discussed the procedure including the risks, benefits and alternatives for the proposed anesthesia with the patient or authorized representative who has indicated his/her understanding and acceptance.     Plan Discussed with: CRNA, Anesthesiologist and Surgeon  Anesthesia Plan Comments:         Anesthesia Quick Evaluation

## 2016-08-14 NOTE — Anesthesia Procedure Notes (Signed)
Procedure Name: MAC Date/Time: 08/14/2016 7:35 AM Performed by: Willeen Cass P Pre-anesthesia Checklist: Patient identified, Emergency Drugs available, Suction available, Patient being monitored and Timeout performed Patient Re-evaluated:Patient Re-evaluated prior to inductionOxygen Delivery Method: Simple face mask Intubation Type: IV induction Placement Confirmation: positive ETCO2 Dental Injury: Teeth and Oropharynx as per pre-operative assessment

## 2016-08-14 NOTE — Anesthesia Postprocedure Evaluation (Signed)
Anesthesia Post Note  Patient: Albert Stanley  Procedure(s) Performed: Procedure(s) (LRB): FISTULA SUPERFICIALIZATION LEFT UPPER ARM (Left) LIGATION OF COMPETING BRANCHES OF ARTERIOVENOUS FISTULA LEFT UPPER ARM (Left)  Patient location during evaluation: PACU Anesthesia Type: MAC Level of consciousness: awake and alert Pain management: pain level controlled Vital Signs Assessment: post-procedure vital signs reviewed and stable Respiratory status: spontaneous breathing, nonlabored ventilation, respiratory function stable and patient connected to nasal cannula oxygen Cardiovascular status: stable and blood pressure returned to baseline Anesthetic complications: no    Last Vitals:  Vitals:   08/14/16 1130 08/14/16 1139  BP:  137/83  Pulse: 68 91  Resp: 17 16  Temp:      Last Pain:  Vitals:   08/14/16 1135  TempSrc:   PainSc: [place]

## 2016-08-14 NOTE — Op Note (Signed)
    NAME: Albert Stanley   MRN: [id number] DOB: Apr 20, 1946    DATE OF OPERATION: 08/14/2016  PREOP DIAGNOSIS: Poorly functioning left brachiocephalic AV fistula  POSTOP DIAGNOSIS: Same  PROCEDURE:  1. Superficialization of left brachiocephalic AV fistula 2.  Ligation of 4 competing branches of left brachiocephalic AV fistula 3. Repair of pseudoaneurysm of left brachiocephalic AV fistula  SURGEON: Judeth Cornfield. Scot Dock, MD, FACS  ASSIST: Silva Bandy, [facility]  ANESTHESIA: Local with sedation   EBL: Minimal  INDICATIONS: Albert Stanley is a 70 y.o. male who has a left upper arm fistula which has not been maturing or functioning adequately. The patient has had 2 previous venoplasty's of a outflow stenosis by Dr. Trula Slade. It was felt that superficialization and ligation of the competing branches was the only remaining option to salvage the fistula.  FINDINGS: The vein was bifid proximally I selected the larger branch. There were 2 other large competing branches. There was also a pseudoaneurysm posteriorly which was repaired.  TECHNIQUE: The patient was taken to the operating room and sedated by anesthesia. The left upper extremity was prepped and draped in the usual sterile fashion. The areas of the branches were identified with duplex. A longitudinal incision was made in the upper part of the fistula below the axilla after the skin was anesthetized. Here the vein was dissected free circumferentially. It was a large competing branch which was ligated and divided between 2-0 silk ties.  Using 2 additional incisions after the skin was anesthetized, the entire cephalic vein was mobilized down to anastomosis to the brachial artery. The incision in the middle of our access due to other large competing branches which were ligated and divided between 3-0 silk ties.   Just above the antecubital level the vein was bifid and I selected the larger of the 2 branches and ligated the smaller branch.  Additionally was a pseudoaneurysm in the posterior aspect of the fistula which I approach to the middle incision. Once the pseudoaneurysm was excised and was bleeding from the hole in the vein which was repaired with a 6-0 Prolene suture.  At the completion, there was an excellent thrill in the fistula. Hemostasis was obtained in the wound. I closed the wounds with a single layer of 4-0 Vicryl so that the vein would be very superficial.  CLINICAL NOTE: The fistula should not be cannulated for 4-6 weeks to allow healing of the incisions.  [name], MD, FACS [facility]  DATE OF DICTATION:   08/14/2016

## 2016-08-14 NOTE — Transfer of Care (Signed)
Immediate Anesthesia Transfer of Care Note  Patient: Albert Stanley  Procedure(s) Performed: Procedure(s): FISTULA SUPERFICIALIZATION LEFT UPPER ARM (Left) LIGATION OF COMPETING BRANCHES OF ARTERIOVENOUS FISTULA LEFT UPPER ARM (Left)  Patient Location: PACU  Anesthesia Type:MAC  Level of Consciousness: awake, alert  and oriented  Airway & Oxygen Therapy: Patient Spontanous Breathing and Patient connected to nasal cannula oxygen  Post-op Assessment: Report given to RN and Post -op Vital signs reviewed and stable  Post vital signs: Reviewed and stable  Last Vitals:  Vitals:   08/14/16 0659  BP: 121/68  Pulse: 83  Resp: 18  Temp: 36.9 C    Last Pain:  Vitals:   08/14/16 0659  TempSrc: Oral      Patients Stated Pain Goal: 4 ([id number])  Complications: No apparent anesthesia complications

## 2016-08-14 NOTE — Interval H&P Note (Signed)
History and Physical Interval Note:  08/14/2016 7:13 AM  Albert Stanley  has presented today for surgery, with the diagnosis of End Stage Renal Disease N18.6; Difficulty cannulating left upper arm arteriovenous fistula T82.590A  The various methods of treatment have been discussed with the patient and family. After consideration of risks, benefits and other options for treatment, the patient has consented to  Procedure(s): FISTULA SUPERFICIALIZATION (Left) LIGATION OF COMPETING BRANCHES OF ARTERIOVENOUS FISTULA (Left) as a surgical intervention .  The patient's history has been reviewed, patient examined, no change in status, stable for surgery.  I have reviewed the patient's chart and labs.  Questions were answered to the patient's satisfaction.     [name]

## 2016-08-14 NOTE — OR Nursing (Signed)
CBG 68 on arrival to PACU; pt able to drink regular gingerale. Will recheck after soda consumed.

## 2016-08-15 ENCOUNTER — Encounter (HOSPITAL_COMMUNITY): Payer: Self-pay | Admitting: Vascular Surgery

## 2016-08-15 DIAGNOSIS — N2581 Secondary hyperparathyroidism of renal origin: Secondary | ICD-10-CM | POA: Diagnosis not present

## 2016-08-15 DIAGNOSIS — E1129 Type 2 diabetes mellitus with other diabetic kidney complication: Secondary | ICD-10-CM | POA: Diagnosis not present

## 2016-08-15 DIAGNOSIS — D631 Anemia in chronic kidney disease: Secondary | ICD-10-CM | POA: Diagnosis not present

## 2016-08-15 DIAGNOSIS — N186 End stage renal disease: Secondary | ICD-10-CM | POA: Diagnosis not present

## 2016-08-15 DIAGNOSIS — D509 Iron deficiency anemia, unspecified: Secondary | ICD-10-CM | POA: Diagnosis not present

## 2016-08-18 DIAGNOSIS — D509 Iron deficiency anemia, unspecified: Secondary | ICD-10-CM | POA: Diagnosis not present

## 2016-08-18 DIAGNOSIS — N2581 Secondary hyperparathyroidism of renal origin: Secondary | ICD-10-CM | POA: Diagnosis not present

## 2016-08-18 DIAGNOSIS — E1129 Type 2 diabetes mellitus with other diabetic kidney complication: Secondary | ICD-10-CM | POA: Diagnosis not present

## 2016-08-18 DIAGNOSIS — D631 Anemia in chronic kidney disease: Secondary | ICD-10-CM | POA: Diagnosis not present

## 2016-08-18 DIAGNOSIS — N186 End stage renal disease: Secondary | ICD-10-CM | POA: Diagnosis not present

## 2016-08-19 DIAGNOSIS — M545 Low back pain: Secondary | ICD-10-CM | POA: Diagnosis not present

## 2016-08-19 DIAGNOSIS — M25512 Pain in left shoulder: Secondary | ICD-10-CM | POA: Diagnosis not present

## 2016-08-19 DIAGNOSIS — M5417 Radiculopathy, lumbosacral region: Secondary | ICD-10-CM | POA: Diagnosis not present

## 2016-08-19 DIAGNOSIS — Z79891 Long term (current) use of opiate analgesic: Secondary | ICD-10-CM | POA: Diagnosis not present

## 2016-08-19 DIAGNOSIS — M25552 Pain in left hip: Secondary | ICD-10-CM | POA: Diagnosis not present

## 2016-08-19 DIAGNOSIS — G894 Chronic pain syndrome: Secondary | ICD-10-CM | POA: Diagnosis not present

## 2016-08-19 DIAGNOSIS — M25562 Pain in left knee: Secondary | ICD-10-CM | POA: Diagnosis not present

## 2016-08-20 DIAGNOSIS — E1129 Type 2 diabetes mellitus with other diabetic kidney complication: Secondary | ICD-10-CM | POA: Diagnosis not present

## 2016-08-20 DIAGNOSIS — D631 Anemia in chronic kidney disease: Secondary | ICD-10-CM | POA: Diagnosis not present

## 2016-08-20 DIAGNOSIS — D509 Iron deficiency anemia, unspecified: Secondary | ICD-10-CM | POA: Diagnosis not present

## 2016-08-20 DIAGNOSIS — N2581 Secondary hyperparathyroidism of renal origin: Secondary | ICD-10-CM | POA: Diagnosis not present

## 2016-08-20 DIAGNOSIS — N186 End stage renal disease: Secondary | ICD-10-CM | POA: Diagnosis not present

## 2016-08-22 DIAGNOSIS — E1129 Type 2 diabetes mellitus with other diabetic kidney complication: Secondary | ICD-10-CM | POA: Diagnosis not present

## 2016-08-22 DIAGNOSIS — N186 End stage renal disease: Secondary | ICD-10-CM | POA: Diagnosis not present

## 2016-08-22 DIAGNOSIS — D509 Iron deficiency anemia, unspecified: Secondary | ICD-10-CM | POA: Diagnosis not present

## 2016-08-22 DIAGNOSIS — D631 Anemia in chronic kidney disease: Secondary | ICD-10-CM | POA: Diagnosis not present

## 2016-08-22 DIAGNOSIS — N2581 Secondary hyperparathyroidism of renal origin: Secondary | ICD-10-CM | POA: Diagnosis not present

## 2016-08-25 DIAGNOSIS — D631 Anemia in chronic kidney disease: Secondary | ICD-10-CM | POA: Diagnosis not present

## 2016-08-25 DIAGNOSIS — N2581 Secondary hyperparathyroidism of renal origin: Secondary | ICD-10-CM | POA: Diagnosis not present

## 2016-08-25 DIAGNOSIS — E1129 Type 2 diabetes mellitus with other diabetic kidney complication: Secondary | ICD-10-CM | POA: Diagnosis not present

## 2016-08-25 DIAGNOSIS — D509 Iron deficiency anemia, unspecified: Secondary | ICD-10-CM | POA: Diagnosis not present

## 2016-08-25 DIAGNOSIS — N186 End stage renal disease: Secondary | ICD-10-CM | POA: Diagnosis not present

## 2016-08-27 DIAGNOSIS — N186 End stage renal disease: Secondary | ICD-10-CM | POA: Diagnosis not present

## 2016-08-27 DIAGNOSIS — E1129 Type 2 diabetes mellitus with other diabetic kidney complication: Secondary | ICD-10-CM | POA: Diagnosis not present

## 2016-08-27 DIAGNOSIS — N2581 Secondary hyperparathyroidism of renal origin: Secondary | ICD-10-CM | POA: Diagnosis not present

## 2016-08-27 DIAGNOSIS — D509 Iron deficiency anemia, unspecified: Secondary | ICD-10-CM | POA: Diagnosis not present

## 2016-08-27 DIAGNOSIS — D631 Anemia in chronic kidney disease: Secondary | ICD-10-CM | POA: Diagnosis not present

## 2016-08-29 DIAGNOSIS — D509 Iron deficiency anemia, unspecified: Secondary | ICD-10-CM | POA: Diagnosis not present

## 2016-08-29 DIAGNOSIS — D631 Anemia in chronic kidney disease: Secondary | ICD-10-CM | POA: Diagnosis not present

## 2016-08-29 DIAGNOSIS — N186 End stage renal disease: Secondary | ICD-10-CM | POA: Diagnosis not present

## 2016-08-29 DIAGNOSIS — E1129 Type 2 diabetes mellitus with other diabetic kidney complication: Secondary | ICD-10-CM | POA: Diagnosis not present

## 2016-08-29 DIAGNOSIS — N2581 Secondary hyperparathyroidism of renal origin: Secondary | ICD-10-CM | POA: Diagnosis not present

## 2016-08-31 DIAGNOSIS — E1129 Type 2 diabetes mellitus with other diabetic kidney complication: Secondary | ICD-10-CM | POA: Diagnosis not present

## 2016-08-31 DIAGNOSIS — D631 Anemia in chronic kidney disease: Secondary | ICD-10-CM | POA: Diagnosis not present

## 2016-08-31 DIAGNOSIS — D509 Iron deficiency anemia, unspecified: Secondary | ICD-10-CM | POA: Diagnosis not present

## 2016-08-31 DIAGNOSIS — N186 End stage renal disease: Secondary | ICD-10-CM | POA: Diagnosis not present

## 2016-08-31 DIAGNOSIS — N2581 Secondary hyperparathyroidism of renal origin: Secondary | ICD-10-CM | POA: Diagnosis not present

## 2016-09-02 DIAGNOSIS — D631 Anemia in chronic kidney disease: Secondary | ICD-10-CM | POA: Diagnosis not present

## 2016-09-02 DIAGNOSIS — D509 Iron deficiency anemia, unspecified: Secondary | ICD-10-CM | POA: Diagnosis not present

## 2016-09-02 DIAGNOSIS — E1129 Type 2 diabetes mellitus with other diabetic kidney complication: Secondary | ICD-10-CM | POA: Diagnosis not present

## 2016-09-02 DIAGNOSIS — N2581 Secondary hyperparathyroidism of renal origin: Secondary | ICD-10-CM | POA: Diagnosis not present

## 2016-09-02 DIAGNOSIS — N186 End stage renal disease: Secondary | ICD-10-CM | POA: Diagnosis not present

## 2016-09-05 DIAGNOSIS — D509 Iron deficiency anemia, unspecified: Secondary | ICD-10-CM | POA: Diagnosis not present

## 2016-09-05 DIAGNOSIS — N186 End stage renal disease: Secondary | ICD-10-CM | POA: Diagnosis not present

## 2016-09-05 DIAGNOSIS — N2581 Secondary hyperparathyroidism of renal origin: Secondary | ICD-10-CM | POA: Diagnosis not present

## 2016-09-05 DIAGNOSIS — E1129 Type 2 diabetes mellitus with other diabetic kidney complication: Secondary | ICD-10-CM | POA: Diagnosis not present

## 2016-09-05 DIAGNOSIS — D631 Anemia in chronic kidney disease: Secondary | ICD-10-CM | POA: Diagnosis not present

## 2016-09-08 DIAGNOSIS — N186 End stage renal disease: Secondary | ICD-10-CM | POA: Diagnosis not present

## 2016-09-08 DIAGNOSIS — D631 Anemia in chronic kidney disease: Secondary | ICD-10-CM | POA: Diagnosis not present

## 2016-09-08 DIAGNOSIS — D509 Iron deficiency anemia, unspecified: Secondary | ICD-10-CM | POA: Diagnosis not present

## 2016-09-08 DIAGNOSIS — N2581 Secondary hyperparathyroidism of renal origin: Secondary | ICD-10-CM | POA: Diagnosis not present

## 2016-09-08 DIAGNOSIS — E1129 Type 2 diabetes mellitus with other diabetic kidney complication: Secondary | ICD-10-CM | POA: Diagnosis not present

## 2016-09-09 ENCOUNTER — Encounter: Payer: Self-pay | Admitting: Vascular Surgery

## 2016-09-10 DIAGNOSIS — E1122 Type 2 diabetes mellitus with diabetic chronic kidney disease: Secondary | ICD-10-CM | POA: Diagnosis not present

## 2016-09-10 DIAGNOSIS — D509 Iron deficiency anemia, unspecified: Secondary | ICD-10-CM | POA: Diagnosis not present

## 2016-09-10 DIAGNOSIS — Z992 Dependence on renal dialysis: Secondary | ICD-10-CM | POA: Diagnosis not present

## 2016-09-10 DIAGNOSIS — N186 End stage renal disease: Secondary | ICD-10-CM | POA: Diagnosis not present

## 2016-09-10 DIAGNOSIS — N2581 Secondary hyperparathyroidism of renal origin: Secondary | ICD-10-CM | POA: Diagnosis not present

## 2016-09-10 DIAGNOSIS — E1129 Type 2 diabetes mellitus with other diabetic kidney complication: Secondary | ICD-10-CM | POA: Diagnosis not present

## 2016-09-10 DIAGNOSIS — D631 Anemia in chronic kidney disease: Secondary | ICD-10-CM | POA: Diagnosis not present

## 2016-09-12 DIAGNOSIS — N2581 Secondary hyperparathyroidism of renal origin: Secondary | ICD-10-CM | POA: Diagnosis not present

## 2016-09-12 DIAGNOSIS — N186 End stage renal disease: Secondary | ICD-10-CM | POA: Diagnosis not present

## 2016-09-12 DIAGNOSIS — D631 Anemia in chronic kidney disease: Secondary | ICD-10-CM | POA: Diagnosis not present

## 2016-09-12 DIAGNOSIS — E1129 Type 2 diabetes mellitus with other diabetic kidney complication: Secondary | ICD-10-CM | POA: Diagnosis not present

## 2016-09-12 DIAGNOSIS — D509 Iron deficiency anemia, unspecified: Secondary | ICD-10-CM | POA: Diagnosis not present

## 2016-09-14 ENCOUNTER — Encounter: Payer: Medicare Other | Admitting: Surgery

## 2016-09-15 DIAGNOSIS — N2581 Secondary hyperparathyroidism of renal origin: Secondary | ICD-10-CM | POA: Diagnosis not present

## 2016-09-15 DIAGNOSIS — D631 Anemia in chronic kidney disease: Secondary | ICD-10-CM | POA: Diagnosis not present

## 2016-09-15 DIAGNOSIS — E1129 Type 2 diabetes mellitus with other diabetic kidney complication: Secondary | ICD-10-CM | POA: Diagnosis not present

## 2016-09-15 DIAGNOSIS — D509 Iron deficiency anemia, unspecified: Secondary | ICD-10-CM | POA: Diagnosis not present

## 2016-09-15 DIAGNOSIS — N186 End stage renal disease: Secondary | ICD-10-CM | POA: Diagnosis not present

## 2016-09-16 ENCOUNTER — Encounter: Payer: Self-pay | Admitting: Vascular Surgery

## 2016-09-16 ENCOUNTER — Ambulatory Visit (INDEPENDENT_AMBULATORY_CARE_PROVIDER_SITE_OTHER): Payer: Medicare Other | Admitting: Vascular Surgery

## 2016-09-16 VITALS — BP 150/60 | HR 69 | Temp 97.8°F | Resp 16 | Ht 70.5 in | Wt 219.0 lb

## 2016-09-16 DIAGNOSIS — N186 End stage renal disease: Secondary | ICD-10-CM

## 2016-09-16 DIAGNOSIS — Z992 Dependence on renal dialysis: Secondary | ICD-10-CM

## 2016-09-16 NOTE — Progress Notes (Signed)
POST OPERATIVE OFFICE NOTE    CC:  F/u for surgery  HPI:  This is a 70 y.o. male who is s/p superficialization of the left BC AVF, ligation of 4 competing branches and repair of pseudoaneurysm of left BC AVF on 08/14/16 by Dr. Scot Dock.  The original fistula was placed on December 13, 2015 by Dr. Scot Dock as well as insertion of Sledge. He had also undergone PTA of subclavian vein on 06/09/16.  After that, he had some arm swelling after the fistulogram and underwent an additional fistulogram on 06/25/16 and was found to have mild recoil from recent SCV intervention with about 30-40% stenosis.  No significant narrowing within the fistula.  The A-V anastamosis is widely patent.  There is a large branch in the mid upper arm.  He presents today for follow up.  He is currently dialyzing via a right IJ TDC.    Allergies  Allergen Reactions  . Meperidine Shortness Of Breath and Other (See Comments)    Increased heart rate Reaction to demerol  . Sulfa Antibiotics Shortness Of Breath and Other (See Comments)    Increased heart rate  . Sulfonamide Derivatives Shortness Of Breath and Other (See Comments)    Increased heart rate  . Eggs Or Egg-Derived Products Nausea And Vomiting     Reaction to egg yolks - not whites    . Foltx [Elfolate Plus] Other (See Comments)    Pt does not recall reaction to this  . Lac Bovis Diarrhea, Nausea And Vomiting and Other (See Comments)    Constipation Reaction to whole milk  . Lactose Intolerance (Gi) Diarrhea, Nausea And Vomiting and Other (See Comments)    Constipation Reaction to whole milk  . Milk-Related Compounds Diarrhea, Nausea And Vomiting and Other (See Comments)    Constipation Reaction to whole milk  . Penicillins     "Knocks me out."   Has patient had a PCN reaction causing immediate rash, facial/tongue/throat swelling, SOB or lightheadedness with hypotension: yes- blacked out Has patient had a PCN reaction causing severe rash involving mucus membranes or  skin necrosis: unknown Has patient had a PCN reaction that required hospitalization: at MD office Has patient had a PCN reaction occurring within the last 10 years: no If all of the above answers are "NO", then may proceed with Cephalosporin use.   . Betadine [Povidone Iodine] Itching, Rash and Other (See Comments)    burning    Current Outpatient Prescriptions  Medication Sig Dispense Refill  . allopurinol (ZYLOPRIM) 300 MG tablet Take 300 mg by mouth daily.     Marland Kitchen atorvastatin (LIPITOR) 40 MG tablet Take 40 mg by mouth at bedtime.   0  . BAYER BREEZE 2 TEST DISK Apply 1 each topically daily as needed (FOR TESTING BLOOD SUGAR).   0  . calcitRIOL (ROCALTROL) 0.5 MCG capsule Take 0.5 mcg by mouth Every Tuesday,Thursday,and Saturday with dialysis. Medication given at dialysis on Tues Thurs Sat  1  . carvedilol (COREG) 3.125 MG tablet Take 1 tablet (3.125 mg total) by mouth See admin instructions. Take 1 tablet (3.125 mg) by mouth twice daily on Monday, Wednesday, Friday and Sunday (non-dialysis days)    . cyclobenzaprine (FLEXERIL) 5 MG tablet Take 5 mg by mouth 2 (two) times daily as needed for muscle spasms.     . famotidine (PEPCID) 20 MG tablet Take 1 tablet (20 mg total) by mouth 2 (two) times daily as needed (sour stomach).    . fluticasone (FLONASE) 50 MCG/ACT  nasal spray Place 2 sprays into both nostrils daily as needed for allergies or rhinitis.     Marland Kitchen glipiZIDE (GLUCOTROL) 5 MG tablet Take 0.5 tablets (2.5 mg total) by mouth 2 (two) times daily.    . Loratadine-Pseudoephedrine (CLARITIN-D 24 HOUR PO) Take 1 tablet by mouth daily. PATIENT NOT SURE OF THE DOSAGE    . multivitamin (RENA-VIT) TABS tablet Take 1 tablet by mouth at bedtime.   0  . sevelamer carbonate (RENVELA) 800 MG tablet Take 2 tablets (1,600 mg total) by mouth 3 (three) times daily with meals. 90 tablet 0  . oxyCODONE-acetaminophen (PERCOCET) 7.5-325 MG tablet Take 1 tablet by mouth 3 (three) times daily as needed (pain).  (Patient not taking: Reported on 09/16/2016) 20 tablet 0   No current facility-administered medications for this visit.      ROS:  See HPI  Physical Exam:  Vitals:   09/16/16 1400  BP: (!) 150/60  Pulse: 69  Resp: 16  Temp: 97.8 F (36.6 C)    Incision:  Well healed Extremities:  2+ left radial pulse.  There is a good thrill within the fistula & it is easily palpable.  Slightly pulsatile distally.   Assessment/Plan:  This is a 70 y.o. male who is s/p: superficialization of the left BC AVF, ligation of 4 competing branches and repair of pseudoaneurysm of left BC AVF on 08/14/16 by Dr. Scot Dock  -pt's fistula appears to be adequate size and has a good thrill.  It will be okay to use the fistula in a couple of weeks (September 30, 2016).  After 2-3 uses of the fistula without difficulty, his catheter may be removed.    Leontine Locket, PA-C [facility] [phone]  Clinic MD:  Pt seen and examined with Dr. Scot Dock

## 2016-09-17 DIAGNOSIS — N186 End stage renal disease: Secondary | ICD-10-CM | POA: Diagnosis not present

## 2016-09-17 DIAGNOSIS — E1129 Type 2 diabetes mellitus with other diabetic kidney complication: Secondary | ICD-10-CM | POA: Diagnosis not present

## 2016-09-17 DIAGNOSIS — D631 Anemia in chronic kidney disease: Secondary | ICD-10-CM | POA: Diagnosis not present

## 2016-09-17 DIAGNOSIS — D509 Iron deficiency anemia, unspecified: Secondary | ICD-10-CM | POA: Diagnosis not present

## 2016-09-17 DIAGNOSIS — N2581 Secondary hyperparathyroidism of renal origin: Secondary | ICD-10-CM | POA: Diagnosis not present

## 2016-09-18 DIAGNOSIS — Z79891 Long term (current) use of opiate analgesic: Secondary | ICD-10-CM | POA: Diagnosis not present

## 2016-09-18 DIAGNOSIS — G894 Chronic pain syndrome: Secondary | ICD-10-CM | POA: Diagnosis not present

## 2016-09-18 DIAGNOSIS — M25562 Pain in left knee: Secondary | ICD-10-CM | POA: Diagnosis not present

## 2016-09-18 DIAGNOSIS — M25552 Pain in left hip: Secondary | ICD-10-CM | POA: Diagnosis not present

## 2016-09-18 DIAGNOSIS — M25512 Pain in left shoulder: Secondary | ICD-10-CM | POA: Diagnosis not present

## 2016-09-18 DIAGNOSIS — M545 Low back pain: Secondary | ICD-10-CM | POA: Diagnosis not present

## 2016-09-19 DIAGNOSIS — D631 Anemia in chronic kidney disease: Secondary | ICD-10-CM | POA: Diagnosis not present

## 2016-09-19 DIAGNOSIS — N186 End stage renal disease: Secondary | ICD-10-CM | POA: Diagnosis not present

## 2016-09-19 DIAGNOSIS — N2581 Secondary hyperparathyroidism of renal origin: Secondary | ICD-10-CM | POA: Diagnosis not present

## 2016-09-19 DIAGNOSIS — E1129 Type 2 diabetes mellitus with other diabetic kidney complication: Secondary | ICD-10-CM | POA: Diagnosis not present

## 2016-09-19 DIAGNOSIS — D509 Iron deficiency anemia, unspecified: Secondary | ICD-10-CM | POA: Diagnosis not present

## 2016-09-22 DIAGNOSIS — D509 Iron deficiency anemia, unspecified: Secondary | ICD-10-CM | POA: Diagnosis not present

## 2016-09-22 DIAGNOSIS — E1129 Type 2 diabetes mellitus with other diabetic kidney complication: Secondary | ICD-10-CM | POA: Diagnosis not present

## 2016-09-22 DIAGNOSIS — D631 Anemia in chronic kidney disease: Secondary | ICD-10-CM | POA: Diagnosis not present

## 2016-09-22 DIAGNOSIS — N2581 Secondary hyperparathyroidism of renal origin: Secondary | ICD-10-CM | POA: Diagnosis not present

## 2016-09-22 DIAGNOSIS — N186 End stage renal disease: Secondary | ICD-10-CM | POA: Diagnosis not present

## 2016-09-24 DIAGNOSIS — N2581 Secondary hyperparathyroidism of renal origin: Secondary | ICD-10-CM | POA: Diagnosis not present

## 2016-09-24 DIAGNOSIS — D631 Anemia in chronic kidney disease: Secondary | ICD-10-CM | POA: Diagnosis not present

## 2016-09-24 DIAGNOSIS — E1129 Type 2 diabetes mellitus with other diabetic kidney complication: Secondary | ICD-10-CM | POA: Diagnosis not present

## 2016-09-24 DIAGNOSIS — D509 Iron deficiency anemia, unspecified: Secondary | ICD-10-CM | POA: Diagnosis not present

## 2016-09-24 DIAGNOSIS — N186 End stage renal disease: Secondary | ICD-10-CM | POA: Diagnosis not present

## 2016-09-26 DIAGNOSIS — N186 End stage renal disease: Secondary | ICD-10-CM | POA: Diagnosis not present

## 2016-09-26 DIAGNOSIS — N2581 Secondary hyperparathyroidism of renal origin: Secondary | ICD-10-CM | POA: Diagnosis not present

## 2016-09-26 DIAGNOSIS — D631 Anemia in chronic kidney disease: Secondary | ICD-10-CM | POA: Diagnosis not present

## 2016-09-26 DIAGNOSIS — D509 Iron deficiency anemia, unspecified: Secondary | ICD-10-CM | POA: Diagnosis not present

## 2016-09-26 DIAGNOSIS — E1129 Type 2 diabetes mellitus with other diabetic kidney complication: Secondary | ICD-10-CM | POA: Diagnosis not present

## 2016-09-29 DIAGNOSIS — N186 End stage renal disease: Secondary | ICD-10-CM | POA: Diagnosis not present

## 2016-09-29 DIAGNOSIS — D509 Iron deficiency anemia, unspecified: Secondary | ICD-10-CM | POA: Diagnosis not present

## 2016-09-29 DIAGNOSIS — E1129 Type 2 diabetes mellitus with other diabetic kidney complication: Secondary | ICD-10-CM | POA: Diagnosis not present

## 2016-09-29 DIAGNOSIS — D631 Anemia in chronic kidney disease: Secondary | ICD-10-CM | POA: Diagnosis not present

## 2016-09-29 DIAGNOSIS — N2581 Secondary hyperparathyroidism of renal origin: Secondary | ICD-10-CM | POA: Diagnosis not present

## 2016-10-01 DIAGNOSIS — D509 Iron deficiency anemia, unspecified: Secondary | ICD-10-CM | POA: Diagnosis not present

## 2016-10-01 DIAGNOSIS — E1129 Type 2 diabetes mellitus with other diabetic kidney complication: Secondary | ICD-10-CM | POA: Diagnosis not present

## 2016-10-01 DIAGNOSIS — N2581 Secondary hyperparathyroidism of renal origin: Secondary | ICD-10-CM | POA: Diagnosis not present

## 2016-10-01 DIAGNOSIS — D631 Anemia in chronic kidney disease: Secondary | ICD-10-CM | POA: Diagnosis not present

## 2016-10-01 DIAGNOSIS — N186 End stage renal disease: Secondary | ICD-10-CM | POA: Diagnosis not present

## 2016-10-03 DIAGNOSIS — D509 Iron deficiency anemia, unspecified: Secondary | ICD-10-CM | POA: Diagnosis not present

## 2016-10-03 DIAGNOSIS — N2581 Secondary hyperparathyroidism of renal origin: Secondary | ICD-10-CM | POA: Diagnosis not present

## 2016-10-03 DIAGNOSIS — D631 Anemia in chronic kidney disease: Secondary | ICD-10-CM | POA: Diagnosis not present

## 2016-10-03 DIAGNOSIS — N186 End stage renal disease: Secondary | ICD-10-CM | POA: Diagnosis not present

## 2016-10-03 DIAGNOSIS — E1129 Type 2 diabetes mellitus with other diabetic kidney complication: Secondary | ICD-10-CM | POA: Diagnosis not present

## 2016-10-06 DIAGNOSIS — N2581 Secondary hyperparathyroidism of renal origin: Secondary | ICD-10-CM | POA: Diagnosis not present

## 2016-10-06 DIAGNOSIS — D509 Iron deficiency anemia, unspecified: Secondary | ICD-10-CM | POA: Diagnosis not present

## 2016-10-06 DIAGNOSIS — E1129 Type 2 diabetes mellitus with other diabetic kidney complication: Secondary | ICD-10-CM | POA: Diagnosis not present

## 2016-10-06 DIAGNOSIS — N186 End stage renal disease: Secondary | ICD-10-CM | POA: Diagnosis not present

## 2016-10-06 DIAGNOSIS — D631 Anemia in chronic kidney disease: Secondary | ICD-10-CM | POA: Diagnosis not present

## 2016-10-08 DIAGNOSIS — E1129 Type 2 diabetes mellitus with other diabetic kidney complication: Secondary | ICD-10-CM | POA: Diagnosis not present

## 2016-10-08 DIAGNOSIS — N186 End stage renal disease: Secondary | ICD-10-CM | POA: Diagnosis not present

## 2016-10-08 DIAGNOSIS — D509 Iron deficiency anemia, unspecified: Secondary | ICD-10-CM | POA: Diagnosis not present

## 2016-10-08 DIAGNOSIS — D631 Anemia in chronic kidney disease: Secondary | ICD-10-CM | POA: Diagnosis not present

## 2016-10-08 DIAGNOSIS — N2581 Secondary hyperparathyroidism of renal origin: Secondary | ICD-10-CM | POA: Diagnosis not present

## 2016-10-10 DIAGNOSIS — E1129 Type 2 diabetes mellitus with other diabetic kidney complication: Secondary | ICD-10-CM | POA: Diagnosis not present

## 2016-10-10 DIAGNOSIS — D631 Anemia in chronic kidney disease: Secondary | ICD-10-CM | POA: Diagnosis not present

## 2016-10-10 DIAGNOSIS — N186 End stage renal disease: Secondary | ICD-10-CM | POA: Diagnosis not present

## 2016-10-10 DIAGNOSIS — N2581 Secondary hyperparathyroidism of renal origin: Secondary | ICD-10-CM | POA: Diagnosis not present

## 2016-10-10 DIAGNOSIS — D509 Iron deficiency anemia, unspecified: Secondary | ICD-10-CM | POA: Diagnosis not present

## 2016-10-11 DIAGNOSIS — Z992 Dependence on renal dialysis: Secondary | ICD-10-CM | POA: Diagnosis not present

## 2016-10-11 DIAGNOSIS — N186 End stage renal disease: Secondary | ICD-10-CM | POA: Diagnosis not present

## 2016-10-11 DIAGNOSIS — E1122 Type 2 diabetes mellitus with diabetic chronic kidney disease: Secondary | ICD-10-CM | POA: Diagnosis not present

## 2016-10-11 DIAGNOSIS — J069 Acute upper respiratory infection, unspecified: Secondary | ICD-10-CM | POA: Diagnosis not present

## 2016-10-13 DIAGNOSIS — J209 Acute bronchitis, unspecified: Secondary | ICD-10-CM | POA: Diagnosis not present

## 2016-10-22 DIAGNOSIS — E1129 Type 2 diabetes mellitus with other diabetic kidney complication: Secondary | ICD-10-CM | POA: Diagnosis not present

## 2016-10-23 DIAGNOSIS — M25552 Pain in left hip: Secondary | ICD-10-CM | POA: Diagnosis not present

## 2016-10-23 DIAGNOSIS — Z79891 Long term (current) use of opiate analgesic: Secondary | ICD-10-CM | POA: Diagnosis not present

## 2016-10-23 DIAGNOSIS — G894 Chronic pain syndrome: Secondary | ICD-10-CM | POA: Diagnosis not present

## 2016-10-23 DIAGNOSIS — M25562 Pain in left knee: Secondary | ICD-10-CM | POA: Diagnosis not present

## 2016-10-23 DIAGNOSIS — M25512 Pain in left shoulder: Secondary | ICD-10-CM | POA: Diagnosis not present

## 2016-10-23 DIAGNOSIS — M545 Low back pain: Secondary | ICD-10-CM | POA: Diagnosis not present

## 2016-10-23 DIAGNOSIS — M25511 Pain in right shoulder: Secondary | ICD-10-CM | POA: Diagnosis not present

## 2016-11-11 DIAGNOSIS — Z992 Dependence on renal dialysis: Secondary | ICD-10-CM | POA: Diagnosis not present

## 2016-11-11 DIAGNOSIS — N186 End stage renal disease: Secondary | ICD-10-CM | POA: Diagnosis not present

## 2016-11-11 DIAGNOSIS — E1122 Type 2 diabetes mellitus with diabetic chronic kidney disease: Secondary | ICD-10-CM | POA: Diagnosis not present

## 2016-11-13 ENCOUNTER — Other Ambulatory Visit (HOSPITAL_BASED_OUTPATIENT_CLINIC_OR_DEPARTMENT_OTHER): Payer: Self-pay

## 2016-11-13 DIAGNOSIS — R0683 Snoring: Secondary | ICD-10-CM

## 2016-11-13 DIAGNOSIS — R5383 Other fatigue: Secondary | ICD-10-CM

## 2016-11-13 DIAGNOSIS — G4710 Hypersomnia, unspecified: Secondary | ICD-10-CM

## 2016-11-16 ENCOUNTER — Ambulatory Visit (HOSPITAL_BASED_OUTPATIENT_CLINIC_OR_DEPARTMENT_OTHER): Payer: Medicare Other | Attending: Nurse Practitioner | Admitting: Internal Medicine

## 2016-11-16 DIAGNOSIS — R0683 Snoring: Secondary | ICD-10-CM | POA: Diagnosis not present

## 2016-11-16 DIAGNOSIS — G4719 Other hypersomnia: Secondary | ICD-10-CM | POA: Diagnosis not present

## 2016-11-16 DIAGNOSIS — R5383 Other fatigue: Secondary | ICD-10-CM | POA: Insufficient documentation

## 2016-11-16 DIAGNOSIS — I493 Ventricular premature depolarization: Secondary | ICD-10-CM | POA: Insufficient documentation

## 2016-11-16 DIAGNOSIS — Z79899 Other long term (current) drug therapy: Secondary | ICD-10-CM | POA: Diagnosis not present

## 2016-11-16 DIAGNOSIS — G4700 Insomnia, unspecified: Secondary | ICD-10-CM | POA: Diagnosis not present

## 2016-11-16 DIAGNOSIS — G4710 Hypersomnia, unspecified: Secondary | ICD-10-CM

## 2016-11-21 DIAGNOSIS — R0683 Snoring: Secondary | ICD-10-CM | POA: Diagnosis not present

## 2016-11-21 NOTE — Procedures (Signed)
  Patient Name: Albert Stanley, Albert Stanley Date: 11/16/2016 Gender: Male D.O.B: [birth year] Age (years): 54 Referring Provider: Joyce Copa Height (inches): 71 Interpreting Physician: Baird Lyons MD, ABSM Weight (lbs): 217 RPSGT: Zadie Rhine BMI: 30 MRN: [id number] Neck Size: 15.00 CLINICAL INFORMATION Sleep Study Type: NPSG  Indication for sleep study: Excessive Daytime Sleepiness, Fatigue, Snoring  Epworth Sleepiness Score: 6  SLEEP STUDY TECHNIQUE As per the AASM Manual for the Scoring of Sleep and Associated Events v2.3 (April 2016) with a hypopnea requiring 4% desaturations.  The channels recorded and monitored were frontal, central and occipital EEG, electrooculogram (EOG), submentalis EMG (chin), nasal and oral airflow, thoracic and abdominal wall motion, anterior tibialis EMG, snore microphone, electrocardiogram, and pulse oximetry.  MEDICATIONS Medications self-administered by patient taken the night of the study : [place] The study was initiated at 9:50:50 PM and ended at 3:45:24 AM.  Sleep onset time was 2.0 minutes and the sleep efficiency was 61.6%. The total sleep time was 218.5 minutes.  Stage REM latency was 70.0 minutes.  The patient spent 2.52% of the night in stage N1 sleep, 72.77% in stage N2 sleep, 0.00% in stage N3 and 24.71% in REM.  Alpha intrusion was absent.  Supine sleep was 34.32%.  RESPIRATORY PARAMETERS The overall apnea/hypopnea index (AHI) was 0.3 per hour. There were 0 total apneas, including 0 obstructive, 0 central and 0 mixed apneas. There were 1 hypopneas and 54 RERAs.  The AHI during Stage REM sleep was 0.0 per hour.  AHI while supine was 0.0 per hour.  The mean oxygen saturation was 96.04%. The minimum SpO2 during sleep was 91.00%.  Moderate snoring was noted during this study.  CARDIAC DATA The 2 lead EKG demonstrated sinus rhythm. The mean heart rate was 84.38 beats per minute. Other EKG findings  include: PVCs.  LEG MOVEMENT DATA The total PLMS were 0 with a resulting PLMS index of 0.00. Associated arousal with leg movement index was 0.0 .  IMPRESSIONS - No significant obstructive sleep apnea occurred during this study (AHI = 0.3/h). - No significant central sleep apnea occurred during this study (CAI = 0.0/h). - The patient had minimal or no oxygen desaturation during the study (Min O2 = 91.00%) - The patient snored with Moderate snoring volume. - EKG findings include PVCs. - Clinically significant periodic limb movements did not occur during sleep. No significant associated arousals. - Difficulty maintaining sleep- awake after 2:00 AM.  DIAGNOSIS - Insomnia - Primary Snoring (786.09 [R06.83 ICD-10])  RECOMMENDATIONS - Avoid alcohol, sedatives and other CNS depressants that may worsen sleep apnea and disrupt normal sleep architecture. - Sleep hygiene should be reviewed to assess factors that may improve sleep quality. - Weight management and regular exercise should be initiated or continued if appropriate.  [Electronically signed] 11/21/2016 02:16 PM  Baird Lyons MD, [place], American Board of Sleep Medicine   NPI: [id number]  [place], American Board of Sleep Medicine  ELECTRONICALLY SIGNED ON:  11/21/2016, 2:13 PM [place] PH: [phone]   FX: [phone] [place]

## 2016-12-03 ENCOUNTER — Emergency Department (HOSPITAL_COMMUNITY)
Admission: EM | Admit: 2016-12-03 | Discharge: 2016-12-03 | Disposition: A | Discharge status: Home / Self Care | Payer: Medicare Other | Attending: Emergency Medicine | Admitting: Emergency Medicine

## 2016-12-03 ENCOUNTER — Encounter (HOSPITAL_COMMUNITY): Payer: Self-pay | Admitting: Emergency Medicine

## 2016-12-03 DIAGNOSIS — I132 Hypertensive heart and chronic kidney disease with heart failure and with stage 5 chronic kidney disease, or end stage renal disease: Secondary | ICD-10-CM | POA: Diagnosis not present

## 2016-12-03 DIAGNOSIS — I5022 Chronic systolic (congestive) heart failure: Secondary | ICD-10-CM | POA: Diagnosis not present

## 2016-12-03 DIAGNOSIS — Z87891 Personal history of nicotine dependence: Secondary | ICD-10-CM | POA: Diagnosis not present

## 2016-12-03 DIAGNOSIS — R059 Cough, unspecified: Secondary | ICD-10-CM

## 2016-12-03 DIAGNOSIS — J069 Acute upper respiratory infection, unspecified: Secondary | ICD-10-CM | POA: Diagnosis not present

## 2016-12-03 DIAGNOSIS — I2510 Atherosclerotic heart disease of native coronary artery without angina pectoris: Secondary | ICD-10-CM | POA: Diagnosis not present

## 2016-12-03 DIAGNOSIS — Z96649 Presence of unspecified artificial hip joint: Secondary | ICD-10-CM | POA: Insufficient documentation

## 2016-12-03 DIAGNOSIS — N186 End stage renal disease: Secondary | ICD-10-CM | POA: Diagnosis not present

## 2016-12-03 DIAGNOSIS — Z7901 Long term (current) use of anticoagulants: Secondary | ICD-10-CM | POA: Insufficient documentation

## 2016-12-03 DIAGNOSIS — J45909 Unspecified asthma, uncomplicated: Secondary | ICD-10-CM | POA: Insufficient documentation

## 2016-12-03 DIAGNOSIS — R05 Cough: Secondary | ICD-10-CM

## 2016-12-03 DIAGNOSIS — Z992 Dependence on renal dialysis: Secondary | ICD-10-CM | POA: Insufficient documentation

## 2016-12-03 DIAGNOSIS — Z7984 Long term (current) use of oral hypoglycemic drugs: Secondary | ICD-10-CM | POA: Insufficient documentation

## 2016-12-03 DIAGNOSIS — Z8551 Personal history of malignant neoplasm of bladder: Secondary | ICD-10-CM | POA: Insufficient documentation

## 2016-12-03 DIAGNOSIS — J111 Influenza due to unidentified influenza virus with other respiratory manifestations: Secondary | ICD-10-CM | POA: Insufficient documentation

## 2016-12-03 DIAGNOSIS — Z79899 Other long term (current) drug therapy: Secondary | ICD-10-CM | POA: Diagnosis not present

## 2016-12-03 DIAGNOSIS — R69 Illness, unspecified: Secondary | ICD-10-CM

## 2016-12-03 DIAGNOSIS — E1122 Type 2 diabetes mellitus with diabetic chronic kidney disease: Secondary | ICD-10-CM | POA: Diagnosis not present

## 2016-12-03 LAB — COMPREHENSIVE METABOLIC PANEL
ALT: 17 U/L (ref 17–63)
AST: 27 U/L (ref 15–41)
Albumin: 3.3 g/dL — ABNORMAL LOW (ref 3.5–5.0)
Alkaline Phosphatase: 55 U/L (ref 38–126)
Anion gap: 14 (ref 5–15)
BILIRUBIN TOTAL: 0.7 mg/dL (ref 0.3–1.2)
BUN: 51 mg/dL — AB (ref 6–20)
CALCIUM: 9 mg/dL (ref 8.9–10.3)
CO2: 26 mmol/L (ref 22–32)
CREATININE: 6.77 mg/dL — AB (ref 0.61–1.24)
Chloride: 99 mmol/L — ABNORMAL LOW (ref 101–111)
GFR calc Af Amer: 8 mL/min — ABNORMAL LOW (ref >60)
GFR, EST NON AFRICAN AMERICAN: 7 mL/min — AB (ref >60)
Glucose, Bld: 99 mg/dL (ref 65–99)
Potassium: 4 mmol/L (ref 3.5–5.1)
Sodium: 139 mmol/L (ref 135–145)
TOTAL PROTEIN: 6.6 g/dL (ref 6.5–8.1)

## 2016-12-03 LAB — CBC WITH DIFFERENTIAL/PLATELET
BASOS ABS: 0 10*3/uL (ref 0.0–0.1)
BASOS PCT: 0 %
EOS ABS: 0 10*3/uL (ref 0.0–0.7)
EOS PCT: 0 %
HCT: 33.4 % — ABNORMAL LOW (ref 39.0–52.0)
Hemoglobin: 10.6 g/dL — ABNORMAL LOW (ref 13.0–17.0)
LYMPHS ABS: 0.6 10*3/uL — AB (ref 0.7–4.0)
Lymphocytes Relative: 16 %
MCH: 29.2 pg (ref 26.0–34.0)
MCHC: 31.7 g/dL (ref 30.0–36.0)
MCV: 92 fL (ref 78.0–100.0)
Monocytes Absolute: 0.6 10*3/uL (ref 0.1–1.0)
Monocytes Relative: 16 %
Neutro Abs: 2.6 10*3/uL (ref 1.7–7.7)
Neutrophils Relative %: 68 %
PLATELETS: 151 10*3/uL (ref 150–400)
RBC: 3.63 MIL/uL — AB (ref 4.22–5.81)
RDW: 15.9 % — ABNORMAL HIGH (ref 11.5–15.5)
WBC: 3.8 10*3/uL — AB (ref 4.0–10.5)

## 2016-12-03 LAB — I-STAT CG4 LACTIC ACID, ED: Lactic Acid, Venous: 1.11 mmol/L (ref 0.5–1.9)

## 2016-12-03 MED ORDER — ALBUTEROL SULFATE HFA 108 (90 BASE) MCG/ACT IN AERS: 1.0000 | INHALATION_SPRAY | Freq: Four times a day (QID) | RESPIRATORY_TRACT | 0 refills | Status: DC | PRN

## 2016-12-03 MED ORDER — BENZONATATE 100 MG PO CAPS: 100.0000 mg | ORAL_CAPSULE | Freq: Three times a day (TID) | ORAL | 0 refills | Status: DC | PRN

## 2016-12-03 MED ORDER — OSELTAMIVIR PHOSPHATE 30 MG PO CAPS: 30.0000 mg | ORAL_CAPSULE | Freq: Every day | ORAL | 0 refills | Status: DC

## 2016-12-03 NOTE — ED Notes (Signed)
Patient to xray.

## 2016-12-03 NOTE — ED Provider Notes (Signed)
[facility] DEPT Provider Note   CSN: [id number] Arrival date & time: 12/03/16  1355   History   Chief Complaint Chief Complaint  Patient presents with  . Pneumonia    HPI ORBEN DEXHEIMER is a 71 y.o. male with pmh of ESRD on dialysis via [facility], asthma, HTN, hyperlipidemia, DVT, non ischemic cardiomyopathy (left heart cath EF 40% on [id number]), chronic systolic CHF presents to ED with headache, productive cough (clear sputum), rhinorrhea, nasal congestion, post-tussive emesis x4 times yesterday, palpable fever x 2 days.  Patient's relative has similar symptoms of nasal congestion, runny nose, low grade fever and cough.  Patient seen in urgent care where they did an x-ray that looked suspicious for pneumonia, patient was sent to ED for further evaluation.  No chest pain, shortness of breath, orthopnea, hemoptysis, nausea, abdominal pain, constipation, diarrhea.  Patient went to dialysis on Tuesday and stayed for 3.5 hours instead of his scheduled 5.5 hours, states he left early because he didn't feel well.  Patient states he uses albuterol inhaler for baseline intermittent chest tightness and shortness of breath, has not had to increase use of albuterol since symptoms onset.  Albuterol is providing him adequate relief of his chest tightness currently. Patient states he has had bilateral lower extremity swelling for the last 3-4 months despite dialysis.    HPI  Past Medical History:  Diagnosis Date  . Asthma   . Bladder cancer ([place]) dx'd 1990   surg only  . Diabetes mellitus without complication ([place])   . ESRD (end stage renal disease) ([name])    Tues, Thurs, Sat dialysis  . History of cardiac catheterization    a. LHC 5/17: no obstructive CAD  . History of DVT (deep vein thrombosis)   . History of nuclear stress test    a. Myoview 4/17: EF 39%, inf, inf-lat, apical inf, apical lat, apical scar, intermediate risk  . Hyperlipidemia   . Hypertension   . NICM (nonischemic  cardiomyopathy) ([place])    a. Echo 2/17: mod LVH, EF 35-40%, inf-lat and inf-sept HK, mod MR, severe LAE, small pericardial effusion    Patient Active Problem List   Diagnosis Date Noted  . Chronic systolic CHF (congestive heart failure) ([place]) 05/14/2016  . Essential hypertension 12/11/2015  . Type 2 diabetes mellitus ([place]) 12/11/2015  . Acute systolic CHF (congestive heart failure) ([place]) 12/06/2015  . Renal hematoma, right 12/04/2015  . CKD (chronic kidney disease), stage IV ([place]) 12/04/2015  . ARF (acute renal failure) ([place]) 12/04/2015  . Normocytic anemia 12/04/2015  . Anticoagulant long-term use 12/04/2015  . History of DVT (deep vein thrombosis) 12/04/2015  . Renal hematoma 12/04/2015  . Myalgia and myositis, unspecified 01/11/2014  . H/O: gout 11/16/2013  . Trochanteric bursitis 10/17/2013  . 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 Lumbar degenerative disc disease 07/17/2013  . Lumbosacral spondylosis without myelopathy 07/17/2013  . Hyperlipidemia 05/27/2010  . CORONARY ATHEROSCLEROSIS NATIVE CORONARY ARTERY 05/27/2010    Past Surgical History:  Procedure Laterality Date  . arthroscopic knee surgery    . AV FISTULA PLACEMENT Left 12/13/2015   Procedure: ARTERIOVENOUS (AV) FISTULA CREATION;  Surgeon: Angelia Mould, MD;  Location: [facility];  Service: Vascular;  Laterality: Left;  . BACK SURGERY     2 times  . CARDIAC CATHETERIZATION N/A 02/28/2016   Procedure: Left Heart Cath and Coronary Angiography;  Surgeon: Larey Dresser, MD;  Location: [facility];  Service: Cardiovascular;  Laterality: N/A;  . CYSTOSCOPY    .  FISTULA SUPERFICIALIZATION Left 08/14/2016   Procedure: FISTULA SUPERFICIALIZATION LEFT UPPER ARM;  Surgeon: Angelia Mould, MD;  Location: [facility];  Service: Vascular;  Laterality: Left;  . INSERTION OF DIALYSIS CATHETER N/A 12/13/2015   Procedure: INSERTION OF DIALYSIS CATHETER;  Surgeon: Angelia Mould, MD;  Location: [facility];  Service: Vascular;   Laterality: N/A;  . JOINT REPLACEMENT     hip  . LIGATION OF COMPETING BRANCHES OF ARTERIOVENOUS FISTULA Left 08/14/2016   Procedure: LIGATION OF COMPETING BRANCHES OF ARTERIOVENOUS FISTULA LEFT UPPER ARM;  Surgeon: Angelia Mould, MD;  Location: [facility];  Service: Vascular;  Laterality: Left;  . PERIPHERAL VASCULAR CATHETERIZATION Left 06/09/2016   Procedure: Fistulagram;  Surgeon: Serafina Mitchell, MD;  Location: [facility];  Service: Cardiovascular;  Laterality: Left;  ARM  . PERIPHERAL VASCULAR CATHETERIZATION Left 06/09/2016   Procedure: Peripheral Vascular Balloon Angioplasty;  Surgeon: Serafina Mitchell, MD;  Location: [facility];  Service: Cardiovascular;  Laterality: Left;  upper arm venous  . PERIPHERAL VASCULAR CATHETERIZATION Left 06/25/2016   Procedure: Fistulagram;  Surgeon: Serafina Mitchell, MD;  Location: [facility];  Service: Cardiovascular;  Laterality: Left;       Home Medications    Prior to Admission medications   Medication Sig Start Date End Date Taking? Authorizing Provider  allopurinol (ZYLOPRIM) 300 MG tablet Take 300 mg by mouth daily.    Yes Historical Provider, MD  atorvastatin (LIPITOR) 40 MG tablet Take 40 mg by mouth at bedtime.  01/21/16  Yes Historical Provider, MD  BAYER BREEZE 2 TEST DISK Apply 1 each topically daily as needed (FOR TESTING BLOOD SUGAR).  11/13/15  Yes Historical Provider, MD  calcitRIOL (ROCALTROL) 0.5 MCG capsule Take 0.5 mcg by mouth Every Tuesday,Thursday,and Saturday with dialysis. Medication given at dialysis on Tues Thurs Sat 11/13/15  Yes Historical Provider, MD  carvedilol (COREG) 3.125 MG tablet Take 1 tablet (3.125 mg total) by mouth See admin instructions. Take 1 tablet (3.125 mg) by mouth twice daily on Monday, Wednesday, Friday and Sunday (non-dialysis days) 08/14/16  Yes Alvia Grove, PA-C  cyclobenzaprine (FLEXERIL) 5 MG tablet Take 5 mg by mouth 2 (two) times daily as needed for muscle spasms.    Yes  Historical Provider, MD  famotidine (PEPCID) 20 MG tablet Take 1 tablet (20 mg total) by mouth 2 (two) times daily as needed (sour stomach). 08/14/16  Yes Kimberly A Trinh, PA-C  fluticasone (FLONASE) 50 MCG/ACT nasal spray Place 2 sprays into both nostrils daily as needed for allergies or rhinitis.    Yes Historical Provider, MD  glipiZIDE (GLUCOTROL) 5 MG tablet Take 0.5 tablets (2.5 mg total) by mouth 2 (two) times daily. 08/14/16  Yes Kimberly A Trinh, PA-C  Loratadine-Pseudoephedrine (CLARITIN-D 24 HOUR PO) Take 1 tablet by mouth daily. PATIENT NOT SURE OF THE DOSAGE   Yes Historical Provider, MD  multivitamin (RENA-VIT) TABS tablet Take 1 tablet by mouth at bedtime.  12/20/15  Yes Historical Provider, MD  sevelamer carbonate (RENVELA) 800 MG tablet Take 2 tablets (1,600 mg total) by mouth 3 (three) times daily with meals. 12/18/15  Yes Geradine Girt, DO  albuterol (PROVENTIL HFA;VENTOLIN HFA) 108 (90 Base) MCG/ACT inhaler Inhale 1-2 puffs into the lungs every 6 (six) hours as needed for wheezing or shortness of breath. 12/03/16   Kinnie Feil, PA-C  benzonatate (TESSALON PERLES) 100 MG capsule Take 1 capsule (100 mg total) by mouth 3 (three) times daily as  needed for cough. 12/03/16   Kinnie Feil, PA-C  oseltamivir (TAMIFLU) 30 MG capsule Take 1 capsule (30 mg total) by mouth daily. Take 30 mg after every hemodialysis session for 5 days total 12/03/16   Kinnie Feil, PA-C  oxyCODONE-acetaminophen (PERCOCET) 7.5-325 MG tablet Take 1 tablet by mouth 3 (three) times daily as needed (pain). Patient not taking: Reported on 09/16/2016 08/14/16   Alvia Grove, PA-C    Family History Family History  Problem Relation Age of Onset  . Arthritis Mother   . Cancer Father   . Heart disease Father   . Hypertension Sister   . Alcohol abuse Brother   . Diabetes Daughter   . Heart attack Paternal Uncle     Social History Social History  Substance Use Topics  . Smoking status: Former  Research scientist (life sciences)  . Smokeless tobacco: Never Used  . Alcohol use No     Comment: Used to drink on the weekends, quit 2 years ago     Allergies   Meperidine; Sulfa antibiotics; Sulfonamide derivatives; Eggs or egg-derived products; Foltx [elfolate plus]; Lac bovis; Lactose intolerance (gi); Milk-related compounds; Penicillins; and Betadine [povidone iodine]   Review of Systems Review of Systems  Constitutional: Positive for fever. Negative for chills.  HENT: Positive for congestion and rhinorrhea. Negative for sore throat.   Eyes: Negative for visual disturbance.  Respiratory: Positive for cough. Negative for shortness of breath.   Cardiovascular: Positive for leg swelling (chronic, bilateral). Negative for chest pain and palpitations.  Gastrointestinal: Positive for vomiting. Negative for abdominal pain, blood in stool, constipation, diarrhea and nausea.  Genitourinary: Negative for difficulty urinating, flank pain and hematuria.  Musculoskeletal: Positive for myalgias. Negative for joint swelling.  Skin: Negative for rash.  Neurological: Positive for headaches. Negative for dizziness, syncope, weakness and light-headedness.  Hematological: Negative.   Psychiatric/Behavioral: Negative.      Physical Exam Updated Vital Signs BP 146/94   Pulse (!) 50   Temp 99.9 F (37.7 C) (Rectal)   Resp 16   Ht 5\' 11"  (1.803 m)   Wt 98.4 kg   SpO2 100%   BMI 30.27 kg/m   Physical Exam  Constitutional: He is oriented to person, place, and time. He appears well-developed and well-nourished. He is cooperative.  Non-toxic appearance. He does not have a sickly appearance. No distress.  HENT:  Head: Normocephalic and atraumatic.  Nose: Mucosal edema present.  Mouth/Throat: Mucous membranes are normal. Posterior oropharyngeal edema and posterior oropharyngeal erythema present. No oropharyngeal exudate.  Mild oropharynx edema and erythema, no exudates.  Eyes: Conjunctivae and EOM are normal. Pupils are  equal, round, and reactive to light.  Neck: Normal range of motion. Neck supple. No JVD present. No tracheal deviation present.  Non tender bilateral submandibular adenopathy  Cardiovascular: Normal rate, regular rhythm, normal heart sounds and intact distal pulses.  Exam reveals no distant heart sounds.   No murmur heard. 2+ bilateral lower extremity edema, symmetric  Pulmonary/Chest: Effort normal and breath sounds normal. No tachypnea and no bradypnea. No respiratory distress. He has no wheezes. He has no rales.  Abdominal: Soft. Bowel sounds are normal. He exhibits no distension and no ascites. There is no tenderness.  Musculoskeletal: Normal range of motion. He exhibits no deformity.  Lymphadenopathy:       Head (right side): Submandibular adenopathy present.       Head (left side): Submandibular adenopathy present.    He has cervical adenopathy.  Neurological: He is alert and oriented  to person, place, and time.  Skin: Skin is warm and dry. Capillary refill takes less than 2 seconds.  Psychiatric: He has a normal mood and affect. His behavior is normal. Judgment and thought content normal.  Nursing note and vitals reviewed.    ED Treatments / Results  Labs (all labs ordered are listed, but only abnormal results are displayed) Labs Reviewed  COMPREHENSIVE METABOLIC PANEL - Abnormal; Notable for the following:       Result Value   Chloride 99 (*)    BUN 51 (*)    Creatinine, Ser 6.77 (*)    Albumin 3.3 (*)    GFR calc non Af Amer 7 (*)    GFR calc Af Amer 8 (*)    All other components within normal limits  CBC WITH DIFFERENTIAL/PLATELET - Abnormal; Notable for the following:    WBC 3.8 (*)    RBC 3.63 (*)    Hemoglobin 10.6 (*)    HCT 33.4 (*)    RDW 15.9 (*)    Lymphs Abs 0.6 (*)    All other components within normal limits  I-STAT CG4 LACTIC ACID, ED    EKG  EKG Interpretation None       Radiology No results found.  Procedures Procedures (including  critical care time)  Medications Ordered in ED Medications - No data to display   Initial Impression / Assessment and Plan / ED Course  I have reviewed the triage vital signs and the nursing notes.  Pertinent labs & imaging results that were available during my care of the patient were reviewed by me and considered in my medical decision making (see chart for details).  Clinical Course as of Dec 04 1032  Thu Dec 03, 2016  1936 Low grade fever, last APAP at 1330 Temp: 99.3 F (37.4 C) [CG]  1936 No tachypnea Resp: 16 [CG]  1937 SBP acceptable BP: 146/94 [CG]  1937 No hypoxia  SpO2: 100 % [CG]  1937 No leukocytosis WBC: (!) 3.8 [CG]  1937 Normal lactic acid Lactic Acid, Venous: 1.11 [CG]    Clinical Course User Index [CG] Kinnie Feil, PA-C    71 y.o. -year-old male with pertinent pmh of ESRD on dialysis via [facility], asthma, HTN, hyperlipidemia, DVT, non ischemic cardiomyopathy (left heart cath EF 40% on [id number]), chronic systolic CHF presents to the ED with URI symptoms  2 days. +sick contacts with similar symptoms (relative).  Symptoms most likely due to a viral URI, possibly influenza. On my exam patient is nontoxic appearing, speaking in full sentences. No fever, no tachypnea, no tachycardia, normal oxygen saturations. Lungs are clear to auscultation bilaterally without wheezing, crackles, egophony. I do not think that a chest x-ray is indicated at this time as vital signs are within normal limits, there are no signs of consolidation on chest exam, there is no hypoxia. Patient brought CXR taken at urgent care this morning, which was reviewed by myself and Dr. Johnney Killian.  No signs of PNA or pulmonary edema.  Doubt bacterial bronchitis, pneumonia at this time. I think that symptoms can be treated conservatively at this point. Given reassuring physical exam and vital signs within normal limits patient will be discharged with symptomatic treatment including rest, fluids, Tessalon,  Robitussin.  Patient will be prescribed tamiflu given comorbidities and sympton onset < 48-72 hours.  Tamiflu renally dose adjusted. Strict ED return precautions given.  Patient advised to f/u with HD on Saturday, states he plans on going.  Advised patient  to f/u with PCP in 2-3 days for re-evaluation and to ensure his symptmos are improving.  Patient verbalized undesrtading, and is agreeable. Patient is aware that a viral URI infection may precede the onset of bacterial bronchitis or pneumonia. Patient is aware of red flag symptoms to monitor for that would warrant return to the ED for further reevaluation. Pt ambulated with pulse ox within normal limits prior to discharge.    Final Clinical Impressions(s) / ED Diagnoses   Final diagnoses:  Cough  Influenza-like illness    New Prescriptions Discharge Medication List as of 12/03/2016  6:05 PM    START taking these medications   Details  albuterol (PROVENTIL HFA;VENTOLIN HFA) 108 (90 Base) MCG/ACT inhaler Inhale 1-2 puffs into the lungs every 6 (six) hours as needed for wheezing or shortness of breath., Starting Thu 12/03/2016, Print    benzonatate (TESSALON PERLES) 100 MG capsule Take 1 capsule (100 mg total) by mouth 3 (three) times daily as needed for cough., Starting Thu 12/03/2016, Print    oseltamivir (TAMIFLU) 30 MG capsule Take 1 capsule (30 mg total) by mouth daily. Take 30 mg after every hemodialysis session for 5 days total, Starting Thu 12/03/2016, Print         Kinnie Feil, PA-C 12/04/16 1034    Charlesetta Shanks, MD 12/19/16 1000

## 2016-12-03 NOTE — Discharge Instructions (Signed)
Please take tessalon perles for disruptive cough.   Take 1 pill of tamiflu (30mg ) AFTER each dialysis session only for a total of 5 days.   Make sure you drink enough fluids and get enough rest.   Elevate your legs whenever you are seated to decrease lower extremity swelling.   Please go to dialysis as scheduled.   Use albuterol as needed for intermittent shortness of breath.

## 2016-12-03 NOTE — ED Triage Notes (Addendum)
Pt reports feeling sick since Tuesday, pt reports he went to dialysis but did not receive his whole session due to feeling ill, last full dialysis session was saturday. Was seen at urgent care today, cxray was concerning for pneumonia. Pt has copy of xray on disc with him

## 2016-12-09 ENCOUNTER — Encounter (HOSPITAL_COMMUNITY): Payer: Self-pay | Admitting: Emergency Medicine

## 2016-12-09 ENCOUNTER — Emergency Department (HOSPITAL_COMMUNITY): Payer: Medicare Other

## 2016-12-09 ENCOUNTER — Inpatient Hospital Stay (HOSPITAL_COMMUNITY)
Admission: EM | Admit: 2016-12-09 | Discharge: 2016-12-18 | DRG: 291 | Disposition: A | Discharge status: Home / Self Care | Payer: Medicare Other | Attending: Family Medicine | Admitting: Family Medicine

## 2016-12-09 DIAGNOSIS — Z885 Allergy status to narcotic agent status: Secondary | ICD-10-CM

## 2016-12-09 DIAGNOSIS — I081 Rheumatic disorders of both mitral and tricuspid valves: Secondary | ICD-10-CM | POA: Diagnosis present

## 2016-12-09 DIAGNOSIS — I429 Cardiomyopathy, unspecified: Secondary | ICD-10-CM | POA: Diagnosis present

## 2016-12-09 DIAGNOSIS — I132 Hypertensive heart and chronic kidney disease with heart failure and with stage 5 chronic kidney disease, or end stage renal disease: Secondary | ICD-10-CM | POA: Diagnosis present

## 2016-12-09 DIAGNOSIS — Z7984 Long term (current) use of oral hypoglycemic drugs: Secondary | ICD-10-CM

## 2016-12-09 DIAGNOSIS — R339 Retention of urine, unspecified: Secondary | ICD-10-CM | POA: Diagnosis present

## 2016-12-09 DIAGNOSIS — N3289 Other specified disorders of bladder: Secondary | ICD-10-CM | POA: Diagnosis not present

## 2016-12-09 DIAGNOSIS — I272 Pulmonary hypertension, unspecified: Secondary | ICD-10-CM | POA: Diagnosis present

## 2016-12-09 DIAGNOSIS — Z9115 Patient's noncompliance with renal dialysis: Secondary | ICD-10-CM

## 2016-12-09 DIAGNOSIS — Z809 Family history of malignant neoplasm, unspecified: Secondary | ICD-10-CM

## 2016-12-09 DIAGNOSIS — N189 Chronic kidney disease, unspecified: Secondary | ICD-10-CM

## 2016-12-09 DIAGNOSIS — E785 Hyperlipidemia, unspecified: Secondary | ICD-10-CM | POA: Diagnosis present

## 2016-12-09 DIAGNOSIS — K5900 Constipation, unspecified: Secondary | ICD-10-CM | POA: Diagnosis not present

## 2016-12-09 DIAGNOSIS — N2581 Secondary hyperparathyroidism of renal origin: Secondary | ICD-10-CM | POA: Diagnosis present

## 2016-12-09 DIAGNOSIS — I10 Essential (primary) hypertension: Secondary | ICD-10-CM | POA: Diagnosis not present

## 2016-12-09 DIAGNOSIS — Z87891 Personal history of nicotine dependence: Secondary | ICD-10-CM

## 2016-12-09 DIAGNOSIS — I509 Heart failure, unspecified: Secondary | ICD-10-CM | POA: Diagnosis not present

## 2016-12-09 DIAGNOSIS — E1121 Type 2 diabetes mellitus with diabetic nephropathy: Secondary | ICD-10-CM | POA: Diagnosis present

## 2016-12-09 DIAGNOSIS — I472 Ventricular tachycardia: Secondary | ICD-10-CM | POA: Diagnosis present

## 2016-12-09 DIAGNOSIS — E1122 Type 2 diabetes mellitus with diabetic chronic kidney disease: Secondary | ICD-10-CM | POA: Diagnosis present

## 2016-12-09 DIAGNOSIS — I2510 Atherosclerotic heart disease of native coronary artery without angina pectoris: Secondary | ICD-10-CM | POA: Diagnosis present

## 2016-12-09 DIAGNOSIS — R7989 Other specified abnormal findings of blood chemistry: Secondary | ICD-10-CM

## 2016-12-09 DIAGNOSIS — I493 Ventricular premature depolarization: Secondary | ICD-10-CM | POA: Diagnosis not present

## 2016-12-09 DIAGNOSIS — E8889 Other specified metabolic disorders: Secondary | ICD-10-CM | POA: Diagnosis present

## 2016-12-09 DIAGNOSIS — E440 Moderate protein-calorie malnutrition: Secondary | ICD-10-CM | POA: Insufficient documentation

## 2016-12-09 DIAGNOSIS — Z882 Allergy status to sulfonamides status: Secondary | ICD-10-CM

## 2016-12-09 DIAGNOSIS — E1169 Type 2 diabetes mellitus with other specified complication: Secondary | ICD-10-CM | POA: Diagnosis present

## 2016-12-09 DIAGNOSIS — R0600 Dyspnea, unspecified: Secondary | ICD-10-CM | POA: Diagnosis present

## 2016-12-09 DIAGNOSIS — I5043 Acute on chronic combined systolic (congestive) and diastolic (congestive) heart failure: Secondary | ICD-10-CM | POA: Diagnosis present

## 2016-12-09 DIAGNOSIS — E8770 Fluid overload, unspecified: Secondary | ICD-10-CM

## 2016-12-09 DIAGNOSIS — N186 End stage renal disease: Secondary | ICD-10-CM | POA: Diagnosis present

## 2016-12-09 DIAGNOSIS — I313 Pericardial effusion (noninflammatory): Secondary | ICD-10-CM | POA: Diagnosis present

## 2016-12-09 DIAGNOSIS — R310 Gross hematuria: Secondary | ICD-10-CM | POA: Diagnosis not present

## 2016-12-09 DIAGNOSIS — I953 Hypotension of hemodialysis: Secondary | ICD-10-CM | POA: Diagnosis not present

## 2016-12-09 DIAGNOSIS — N320 Bladder-neck obstruction: Secondary | ICD-10-CM | POA: Diagnosis present

## 2016-12-09 DIAGNOSIS — I5023 Acute on chronic systolic (congestive) heart failure: Secondary | ICD-10-CM

## 2016-12-09 DIAGNOSIS — Z888 Allergy status to other drugs, medicaments and biological substances status: Secondary | ICD-10-CM

## 2016-12-09 DIAGNOSIS — Z79899 Other long term (current) drug therapy: Secondary | ICD-10-CM

## 2016-12-09 DIAGNOSIS — Z86718 Personal history of other venous thrombosis and embolism: Secondary | ICD-10-CM

## 2016-12-09 DIAGNOSIS — Z880 Allergy status to penicillin: Secondary | ICD-10-CM

## 2016-12-09 DIAGNOSIS — D696 Thrombocytopenia, unspecified: Secondary | ICD-10-CM | POA: Diagnosis present

## 2016-12-09 DIAGNOSIS — D631 Anemia in chronic kidney disease: Secondary | ICD-10-CM | POA: Diagnosis present

## 2016-12-09 DIAGNOSIS — Z8249 Family history of ischemic heart disease and other diseases of the circulatory system: Secondary | ICD-10-CM

## 2016-12-09 DIAGNOSIS — Z992 Dependence on renal dialysis: Secondary | ICD-10-CM

## 2016-12-09 DIAGNOSIS — R778 Other specified abnormalities of plasma proteins: Secondary | ICD-10-CM

## 2016-12-09 DIAGNOSIS — Z8739 Personal history of other diseases of the musculoskeletal system and connective tissue: Secondary | ICD-10-CM

## 2016-12-09 DIAGNOSIS — J45909 Unspecified asthma, uncomplicated: Secondary | ICD-10-CM | POA: Diagnosis present

## 2016-12-09 DIAGNOSIS — Z8551 Personal history of malignant neoplasm of bladder: Secondary | ICD-10-CM

## 2016-12-09 DIAGNOSIS — Z96642 Presence of left artificial hip joint: Secondary | ICD-10-CM | POA: Diagnosis present

## 2016-12-09 DIAGNOSIS — E739 Lactose intolerance, unspecified: Secondary | ICD-10-CM | POA: Diagnosis present

## 2016-12-09 DIAGNOSIS — Z91012 Allergy to eggs: Secondary | ICD-10-CM

## 2016-12-09 DIAGNOSIS — R109 Unspecified abdominal pain: Secondary | ICD-10-CM

## 2016-12-09 DIAGNOSIS — M109 Gout, unspecified: Secondary | ICD-10-CM | POA: Diagnosis present

## 2016-12-09 LAB — BASIC METABOLIC PANEL
Anion gap: 19 — ABNORMAL HIGH (ref 5–15)
BUN: 73 mg/dL — AB (ref 6–20)
CHLORIDE: 96 mmol/L — AB (ref 101–111)
CO2: 21 mmol/L — ABNORMAL LOW (ref 22–32)
Calcium: 8.2 mg/dL — ABNORMAL LOW (ref 8.9–10.3)
Creatinine, Ser: 7.83 mg/dL — ABNORMAL HIGH (ref 0.61–1.24)
GFR calc Af Amer: 7 mL/min — ABNORMAL LOW (ref >60)
GFR calc non Af Amer: 6 mL/min — ABNORMAL LOW (ref >60)
Glucose, Bld: 114 mg/dL — ABNORMAL HIGH (ref 65–99)
POTASSIUM: 3.9 mmol/L (ref 3.5–5.1)
SODIUM: 136 mmol/L (ref 135–145)

## 2016-12-09 LAB — I-STAT TROPONIN, ED
TROPONIN I, POC: 0.1 ng/mL — AB (ref 0.00–0.08)
Troponin i, poc: 0.09 ng/mL (ref 0.00–0.08)

## 2016-12-09 LAB — CBC
HCT: 36 % — ABNORMAL LOW (ref 39.0–52.0)
HEMATOCRIT: 35.5 % — AB (ref 39.0–52.0)
Hemoglobin: 11.7 g/dL — ABNORMAL LOW (ref 13.0–17.0)
Hemoglobin: 13.3 g/dL (ref 13.0–17.0)
MCH: 29.2 pg (ref 26.0–34.0)
MCH: 32.9 pg (ref 26.0–34.0)
MCHC: 33 g/dL (ref 30.0–36.0)
MCHC: 36.9 g/dL — ABNORMAL HIGH (ref 30.0–36.0)
MCV: 88.5 fL (ref 78.0–100.0)
MCV: 89.1 fL (ref 78.0–100.0)
PLATELETS: 138 10*3/uL — AB (ref 150–400)
Platelets: 146 10*3/uL — ABNORMAL LOW (ref 150–400)
RBC: 4.01 MIL/uL — ABNORMAL LOW (ref 4.22–5.81)
RBC: 4.04 MIL/uL — ABNORMAL LOW (ref 4.22–5.81)
RDW: 15.7 % — AB (ref 11.5–15.5)
RDW: 15.7 % — AB (ref 11.5–15.5)
WBC: 5 10*3/uL (ref 4.0–10.5)
WBC: 4.8 10*3/uL (ref 4.0–10.5)

## 2016-12-09 LAB — CREATININE, SERUM
Creatinine, Ser: 8.02 mg/dL — ABNORMAL HIGH (ref 0.61–1.24)
GFR calc Af Amer: 7 mL/min — ABNORMAL LOW (ref >60)
GFR calc non Af Amer: 6 mL/min — ABNORMAL LOW (ref >60)

## 2016-12-09 LAB — GLUCOSE, CAPILLARY: Glucose-Capillary: 118 mg/dL — ABNORMAL HIGH (ref 65–99)

## 2016-12-09 LAB — BRAIN NATRIURETIC PEPTIDE: B NATRIURETIC PEPTIDE 5: 3698.3 pg/mL — AB (ref 0.0–100.0)

## 2016-12-09 MED ORDER — DEXTROSE 5 % IV SOLN
200.0000 mg | Freq: Once | INTRAVENOUS | Status: AC
  Administered 2016-12-09: 200 mg via INTRAVENOUS
  Filled 2016-12-09: qty 20

## 2016-12-09 MED ORDER — ACETAMINOPHEN 650 MG RE SUPP: 650.0000 mg | Freq: Four times a day (QID) | RECTAL | Status: DC | PRN

## 2016-12-09 MED ORDER — ONDANSETRON HCL 4 MG/2ML IJ SOLN
4.0000 mg | Freq: Four times a day (QID) | INTRAMUSCULAR | Status: DC | PRN
  Administered 2016-12-17: 4 mg via INTRAVENOUS

## 2016-12-09 MED ORDER — ALLOPURINOL 300 MG PO TABS
300.0000 mg | ORAL_TABLET | Freq: Every day | ORAL | Status: DC
  Administered 2016-12-10 – 2016-12-18 (×9): 300 mg via ORAL
  Filled 2016-12-09 (×9): qty 1

## 2016-12-09 MED ORDER — SEVELAMER CARBONATE 800 MG PO TABS
1600.0000 mg | ORAL_TABLET | Freq: Three times a day (TID) | ORAL | Status: DC
  Administered 2016-12-11 – 2016-12-13 (×7): 1600 mg via ORAL
  Filled 2016-12-09 (×7): qty 2

## 2016-12-09 MED ORDER — RENA-VITE PO TABS
1.0000 | ORAL_TABLET | Freq: Every day | ORAL | Status: DC
  Administered 2016-12-09 – 2016-12-17 (×8): 1 via ORAL
  Filled 2016-12-09 (×8): qty 1

## 2016-12-09 MED ORDER — GUAIFENESIN-DM 100-10 MG/5ML PO SYRP
5.0000 mL | ORAL_SOLUTION | ORAL | Status: DC | PRN
  Administered 2016-12-09 – 2016-12-16 (×7): 5 mL via ORAL
  Filled 2016-12-09 (×7): qty 5

## 2016-12-09 MED ORDER — BISACODYL 5 MG PO TBEC
10.0000 mg | DELAYED_RELEASE_TABLET | Freq: Once | ORAL | Status: AC
  Administered 2016-12-09: 10 mg via ORAL
  Filled 2016-12-09: qty 2

## 2016-12-09 MED ORDER — ENOXAPARIN SODIUM 30 MG/0.3ML ~~LOC~~ SOLN
30.0000 mg | SUBCUTANEOUS | Status: DC
  Administered 2016-12-09 – 2016-12-13 (×4): 30 mg via SUBCUTANEOUS
  Filled 2016-12-09 (×4): qty 0.3

## 2016-12-09 MED ORDER — LORATADINE 10 MG PO TABS
10.0000 mg | ORAL_TABLET | Freq: Every day | ORAL | Status: DC
  Administered 2016-12-10 – 2016-12-18 (×6): 10 mg via ORAL
  Filled 2016-12-09 (×9): qty 1

## 2016-12-09 MED ORDER — ACETAMINOPHEN 325 MG PO TABS
650.0000 mg | ORAL_TABLET | Freq: Four times a day (QID) | ORAL | Status: DC | PRN
  Administered 2016-12-13: 650 mg via ORAL
  Filled 2016-12-09: qty 2

## 2016-12-09 MED ORDER — IPRATROPIUM-ALBUTEROL 0.5-2.5 (3) MG/3ML IN SOLN
3.0000 mL | Freq: Once | RESPIRATORY_TRACT | Status: AC
  Administered 2016-12-09: 3 mL via RESPIRATORY_TRACT
  Filled 2016-12-09: qty 3

## 2016-12-09 MED ORDER — CARVEDILOL 3.125 MG PO TABS
3.1250 mg | ORAL_TABLET | ORAL | Status: DC
  Administered 2016-12-13 – 2016-12-18 (×6): 3.125 mg via ORAL
  Filled 2016-12-09 (×5): qty 1

## 2016-12-09 MED ORDER — GLUCOSE BLOOD VI DISK: 1.0000 | DISK | Freq: Every day | Status: DC | PRN

## 2016-12-09 MED ORDER — GLIPIZIDE 2.5 MG HALF TABLET
2.5000 mg | ORAL_TABLET | Freq: Two times a day (BID) | ORAL | Status: DC
  Filled 2016-12-09: qty 1

## 2016-12-09 MED ORDER — ASPIRIN 81 MG PO CHEW
324.0000 mg | CHEWABLE_TABLET | Freq: Once | ORAL | Status: AC
  Administered 2016-12-09: 324 mg via ORAL
  Filled 2016-12-09: qty 4

## 2016-12-09 MED ORDER — FAMOTIDINE 20 MG PO TABS: 20.0000 mg | ORAL_TABLET | Freq: Two times a day (BID) | ORAL | Status: DC | PRN

## 2016-12-09 MED ORDER — INSULIN ASPART 100 UNIT/ML ~~LOC~~ SOLN
0.0000 [IU] | Freq: Three times a day (TID) | SUBCUTANEOUS | Status: DC
  Administered 2016-12-12 – 2016-12-14 (×4): 2 [IU] via SUBCUTANEOUS
  Administered 2016-12-14: 1 [IU] via SUBCUTANEOUS
  Administered 2016-12-16: 3 [IU] via SUBCUTANEOUS
  Administered 2016-12-17: 5 [IU] via SUBCUTANEOUS
  Administered 2016-12-18: 3 [IU] via SUBCUTANEOUS
  Administered 2016-12-18: 1 [IU] via SUBCUTANEOUS

## 2016-12-09 MED ORDER — ONDANSETRON HCL 4 MG PO TABS: 4.0000 mg | ORAL_TABLET | Freq: Four times a day (QID) | ORAL | Status: DC | PRN

## 2016-12-09 MED ORDER — ATORVASTATIN CALCIUM 40 MG PO TABS
40.0000 mg | ORAL_TABLET | Freq: Every day | ORAL | Status: DC
  Administered 2016-12-09 – 2016-12-17 (×8): 40 mg via ORAL
  Filled 2016-12-09 (×8): qty 1

## 2016-12-09 MED ORDER — OXYCODONE-ACETAMINOPHEN 7.5-325 MG PO TABS
1.0000 | ORAL_TABLET | Freq: Three times a day (TID) | ORAL | Status: DC | PRN
  Administered 2016-12-09 – 2016-12-14 (×11): 1 via ORAL
  Filled 2016-12-09 (×13): qty 1

## 2016-12-09 MED ORDER — FLUTICASONE PROPIONATE 50 MCG/ACT NA SUSP: 2.0000 | Freq: Every day | NASAL | Status: DC | PRN

## 2016-12-09 MED ORDER — ALBUTEROL SULFATE (2.5 MG/3ML) 0.083% IN NEBU: 2.5000 mg | INHALATION_SOLUTION | Freq: Four times a day (QID) | RESPIRATORY_TRACT | Status: DC | PRN

## 2016-12-09 MED ORDER — CYCLOBENZAPRINE HCL 5 MG PO TABS: 5.0000 mg | ORAL_TABLET | Freq: Two times a day (BID) | ORAL | Status: DC | PRN

## 2016-12-09 MED ORDER — BENZONATATE 100 MG PO CAPS: 100.0000 mg | ORAL_CAPSULE | Freq: Three times a day (TID) | ORAL | Status: DC | PRN

## 2016-12-09 MED ORDER — CALCITRIOL 0.5 MCG PO CAPS: 0.5000 ug | ORAL_CAPSULE | ORAL | Status: DC

## 2016-12-09 NOTE — H&P (Signed)
History and Physical    Albert Stanley [id number] DOB: 1946-07-08 DOA: 12/09/2016  Referring MD/NP/PA: Dr. Roxanne Mins   PCP: [name], MD   Outpatient Specialists: Nephrologist Dr. [name]   Patient coming from: home  Chief Complaint: dyspnea   HPI: Albert Stanley is a 71 y.o. male with known ESRD on HD TTS, DM type II with complications of nephropathy, HTN, presented to [facility] ED with main concern of several days duration of progressively worsening dyspnea that initially started with exertion and has progressed to dyspnea at rest in the past 48 hours. Pt also reports progressive LE swelling and poor oral intake. He has missed Tuesday HD. Pt denies chest pain, no specific abd concerns, he is still able to make some urine. No fevers, chills reported.   ED Course: Pt initially in mild respiratory distress, has received dose of IV Lasix since arrival to the ED and reports feeling better at this time. VS notable for BP 154/92, otherwise stable, blood work notable Cr 7.83, CO2 = 21, Na 136, K 3.9. TRH asked to admit for further evaluation.   Review of Systems:  Constitutional: Negative for fever, chills, diaphoresis, and fatigue.  HENT: Negative for ear pain, nosebleeds, congestion, facial swelling, rhinorrhea, neck pain, neck stiffness and ear discharge.   Eyes: Negative for pain, discharge, redness, itching and visual disturbance.  Respiratory: Negative for wheezing and stridor.   Cardiovascular: Negative for chest pain, palpitations Gastrointestinal: Negative for abdominal distention.  Genitourinary: Negative for dysuria, urgency, frequency, hematuria, flank pain, decreased urine volume Musculoskeletal: Negative for back pain, joint swelling, arthralgias and gait problem.  Neurological: Negative for dizziness, tremors, seizures, syncope, facial asymmetry Hematological: Negative for adenopathy. Does not bruise/bleed easily.  Psychiatric/Behavioral: Negative for hallucinations,  behavioral problems, confusion, dysphoric mood  Past Medical History:  Diagnosis Date  . Asthma   . Bladder cancer ([place]) dx'd 1990   surg only  . Diabetes mellitus without complication ([place])   . ESRD (end stage renal disease) ([name])    Tues, Thurs, Sat dialysis  . History of cardiac catheterization    a. LHC 5/17: no obstructive CAD  . History of DVT (deep vein thrombosis)   . History of nuclear stress test    a. Myoview 4/17: EF 39%, inf, inf-lat, apical inf, apical lat, apical scar, intermediate risk  . Hyperlipidemia   . Hypertension   . NICM (nonischemic cardiomyopathy) ([place])    a. Echo 2/17: mod LVH, EF 35-40%, inf-lat and inf-sept HK, mod MR, severe LAE, small pericardial effusion    Past Surgical History:  Procedure Laterality Date  . arthroscopic knee surgery    . AV FISTULA PLACEMENT Left 12/13/2015   Procedure: ARTERIOVENOUS (AV) FISTULA CREATION;  Surgeon: Angelia Mould, MD;  Location: [place];  Service: Vascular;  Laterality: Left;  . BACK SURGERY     2 times  . CARDIAC CATHETERIZATION N/A 02/28/2016   Procedure: Left Heart Cath and Coronary Angiography;  Surgeon: Larey Dresser, MD;  Location: [place] [facility];  Service: Cardiovascular;  Laterality: N/A;  . CYSTOSCOPY    . FISTULA SUPERFICIALIZATION Left 08/14/2016   Procedure: FISTULA SUPERFICIALIZATION LEFT UPPER ARM;  Surgeon: Angelia Mould, MD;  Location: [place];  Service: Vascular;  Laterality: Left;  . INSERTION OF DIALYSIS CATHETER N/A 12/13/2015   Procedure: INSERTION OF DIALYSIS CATHETER;  Surgeon: Angelia Mould, MD;  Location: [place];  Service: Vascular;  Laterality: N/A;  . JOINT REPLACEMENT     hip  .  LIGATION OF COMPETING BRANCHES OF ARTERIOVENOUS FISTULA Left 08/14/2016   Procedure: LIGATION OF COMPETING BRANCHES OF ARTERIOVENOUS FISTULA LEFT UPPER ARM;  Surgeon: Angelia Mould, MD;  Location: [place];  Service: Vascular;  Laterality: Left;  . PERIPHERAL VASCULAR CATHETERIZATION  Left 06/09/2016   Procedure: Fistulagram;  Surgeon: Serafina Mitchell, MD;  Location: [place] [facility];  Service: Cardiovascular;  Laterality: Left;  ARM  . PERIPHERAL VASCULAR CATHETERIZATION Left 06/09/2016   Procedure: Peripheral Vascular Balloon Angioplasty;  Surgeon: Serafina Mitchell, MD;  Location: [place] [facility];  Service: Cardiovascular;  Laterality: Left;  upper arm venous  . PERIPHERAL VASCULAR CATHETERIZATION Left 06/25/2016   Procedure: Fistulagram;  Surgeon: Serafina Mitchell, MD;  Location: [place] [facility];  Service: Cardiovascular;  Laterality: Left;   Social Hx:  reports that he has quit smoking. He has never used smokeless tobacco. He reports that he does not drink alcohol or use drugs.  Allergies  Allergen Reactions  . Meperidine Shortness Of Breath and Other (See Comments)    Increased heart rate Reaction to demerol  . Sulfa Antibiotics Shortness Of Breath and Other (See Comments)    Increased heart rate  . Sulfonamide Derivatives Shortness Of Breath and Other (See Comments)    Increased heart rate  . Eggs Or Egg-Derived Products Nausea And Vomiting     Reaction to egg yolks - not whites    . Foltx [Elfolate Plus] Other (See Comments)    Pt does not recall reaction to this  . Lac Bovis Diarrhea, Nausea And Vomiting and Other (See Comments)    Constipation Reaction to whole milk  . Lactose Intolerance (Gi) Diarrhea, Nausea And Vomiting and Other (See Comments)    Constipation Reaction to whole milk  . Milk-Related Compounds Diarrhea, Nausea And Vomiting and Other (See Comments)    Constipation Reaction to whole milk  . Penicillins     "Knocks me out."   Has patient had a PCN reaction causing immediate rash, facial/tongue/throat swelling, SOB or lightheadedness with hypotension: yes- blacked out Has patient had a PCN reaction causing severe rash involving mucus membranes or skin necrosis: unknown Has patient had a PCN reaction that required hospitalization: at  MD office Has patient had a PCN reaction occurring within the last 10 years: no If all of the above answers are "NO", then may proceed with Cephalosporin use.   . Betadine [Povidone Iodine] Itching, Rash and Other (See Comments)    burning    Family History  Problem Relation Age of Onset  . Arthritis Mother   . Cancer Father   . Heart disease Father   . Hypertension Sister   . Alcohol abuse Brother   . Diabetes Daughter   . Heart attack Paternal Uncle     Prior to Admission medications   Medication Sig Start Date End Date Taking? Authorizing Provider  albuterol (PROVENTIL HFA;VENTOLIN HFA) 108 (90 Base) MCG/ACT inhaler Inhale 1-2 puffs into the lungs every 6 (six) hours as needed for wheezing or shortness of breath. 12/03/16  Yes Kinnie Feil, PA-C  allopurinol (ZYLOPRIM) 300 MG tablet Take 300 mg by mouth daily.    Yes Historical Provider, MD  atorvastatin (LIPITOR) 40 MG tablet Take 40 mg by mouth at bedtime.  01/21/16  Yes Historical Provider, MD  BAYER BREEZE 2 TEST DISK Apply 1 each topically daily as needed (FOR TESTING BLOOD SUGAR).  11/13/15  Yes Historical Provider, MD  benzonatate (TESSALON PERLES) 100 MG capsule Take 1  capsule (100 mg total) by mouth 3 (three) times daily as needed for cough. 12/03/16  Yes Kinnie Feil, PA-C  calcitRIOL (ROCALTROL) 0.5 MCG capsule Take 0.5 mcg by mouth Every Tuesday,Thursday,and Saturday with dialysis. Medication given at dialysis on Tues Thurs Sat 11/13/15  Yes Historical Provider, MD  carvedilol (COREG) 3.125 MG tablet Take 1 tablet (3.125 mg total) by mouth See admin instructions. Take 1 tablet (3.125 mg) by mouth twice daily on Monday, Wednesday, Friday and Sunday (non-dialysis days) 08/14/16  Yes Alvia Grove, PA-C  cyclobenzaprine (FLEXERIL) 5 MG tablet Take 5 mg by mouth 2 (two) times daily as needed for muscle spasms.    Yes Historical Provider, MD  famotidine (PEPCID) 20 MG tablet Take 1 tablet (20 mg total) by mouth 2 (two)  times daily as needed (sour stomach). 08/14/16  Yes Kimberly A Trinh, PA-C  fluticasone (FLONASE) 50 MCG/ACT nasal spray Place 2 sprays into both nostrils daily as needed for allergies or rhinitis.    Yes Historical Provider, MD  glipiZIDE (GLUCOTROL) 5 MG tablet Take 0.5 tablets (2.5 mg total) by mouth 2 (two) times daily. 08/14/16  Yes Kimberly A Trinh, PA-C  Loratadine-Pseudoephedrine (CLARITIN-D 24 HOUR PO) Take 1 tablet by mouth daily. PATIENT NOT SURE OF THE DOSAGE   Yes Historical Provider, MD  multivitamin (RENA-VIT) TABS tablet Take 1 tablet by mouth at bedtime.  12/20/15  Yes Historical Provider, MD  oseltamivir (TAMIFLU) 30 MG capsule Take 1 capsule (30 mg total) by mouth daily. Take 30 mg after every hemodialysis session for 5 days total 12/03/16  Yes Kinnie Feil, PA-C  oxyCODONE-acetaminophen (PERCOCET) 7.5-325 MG tablet Take 1 tablet by mouth 3 (three) times daily as needed (pain). 08/14/16  Yes Alvia Grove, PA-C  sevelamer carbonate (RENVELA) 800 MG tablet Take 2 tablets (1,600 mg total) by mouth 3 (three) times daily with meals. 12/18/15  Yes Geradine Girt, DO    Physical Exam: Vitals:   12/09/16 1630 12/09/16 1645 12/09/16 1700 12/09/16 1745  BP: 150/98 (!) 164/101 164/89 (!) 154/102  Pulse: 86  63 89  Resp: 16 14 14 13   Temp:      TempSrc:      SpO2: 99%  97% 98%    Constitutional: NAD, calm, comfortable Vitals:   12/09/16 1630 12/09/16 1645 12/09/16 1700 12/09/16 1745  BP: 150/98 (!) 164/101 164/89 (!) 154/102  Pulse: 86  63 89  Resp: 16 14 14 13   Temp:      TempSrc:      SpO2: 99%  97% 98%   Eyes: PERRL, lids and conjunctivae normal ENMT: Mucous membranes are moist. Posterior pharynx clear of any exudate or lesions.Normal dentition.  Neck: normal, supple, no masses, no thyromegaly Respiratory: bibasilar crackles noted and diminished breath sounds at bases  Cardiovascular: Regular rate and rhythm, no murmurs / rubs / gallops. +2-3 bilateral LE edema.    Abdomen: no tenderness, no masses palpated. No hepatosplenomegaly. Bowel sounds positive.  Musculoskeletal: no clubbing / cyanosis. No joint deformity upper and lower extremities. Good ROM, no contractures.  Skin: no rashes, lesions, ulcers. No induration Neurologic: CN 2-12 grossly intact. Sensation intact, DTR normal. Strength 5/5 in all 4.  Psychiatric: Normal judgment and insight. Alert and oriented x 3. Normal mood.   Labs on Admission: I have personally reviewed following labs and imaging studies  CBC:  Recent Labs Lab 12/03/16 1450 12/09/16 1201  WBC 3.8* 5.0  NEUTROABS 2.6  --   HGB 10.6* 11.7*  HCT 33.4* 35.5*  MCV 92.0 88.5  PLT 151 123456*   Basic Metabolic Panel:  Recent Labs Lab 12/03/16 1450 12/09/16 1201  NA 139 136  K 4.0 3.9  CL 99* 96*  CO2 26 21*  GLUCOSE 99 114*  BUN 51* 73*  CREATININE 6.77* 7.83*  CALCIUM 9.0 8.2*   Liver Function Tests:  Recent Labs Lab 12/03/16 1450  AST 27  ALT 17  ALKPHOS 55  BILITOT 0.7  PROT 6.6  ALBUMIN 3.3*   Radiological Exams on Admission: Dg Chest 2 View Result Date: 12/09/2016 Mild enlargement of the cardiopericardial silhouette with prominent upper zone pulmonary vasculature suggesting cephalization of blood flow compatible with pulmonary venous hypertension. Stable scarring at the right lung base. Tortuous thoracic aorta.   EKG: pending   Assessment/Plan Principal Problem:   Fluid overload / Acute on chronic systolic and diastolic heart failure, NYHA class 1 ([name]) - Fluid overload likely secondary to missed dialysis 12/08/2016 - Likely also acute on chronic systolic and diastolic CHF exacerbation - Last 2-D echo 12/05/2015 showed ejection fraction 35-40% with hypokinesis of the inferolateral and inferoseptal myocardium - He was given Lasix in ED - Will defer to nephrology is ongoing IV Lasix is required - Notified nephrology on-call to make aware of possible HD tomorrow as scheduled   Active  Problems:   ESRD on hemodialysis ([place]) - Management per renal - Continue Calcitrol, Savella manner    H/O: gout - Continue allopurinol    Dyslipidemia associated with type 2 diabetes mellitus ([place]) - Continue Lipitor    Diabetes mellitus with diabetic nephropathy without long-term current use of insulin ([name])  - Continue glipizide - add SSI     Thrombocytopenia - mild, reactive - CBC in AM  DVT prophylaxis: Lovenox subQ Code Status: full code Family Communication: Pt updated at bedside; wife at bedside  Disposition Plan: admission to telemetry  Consults called: Paged Dr. Florene Glen of nephrology, let him know if renal can see tomorrow for HD Admission status: Inpatient; patient presented with shortness of breath and lower extremity edema +3. Patient is short of breath at rest and with activity which is likely because of skipped dialysis. Patient reported having flulike symptoms over last week and feeling so tired for which reason he did not attend dialysis on Tuesday. He has to be admitted for fluid overload, likely acute congestive heart failure, systolic and diastolic. His last 2-D echo was in February 2017 and he has ejection fraction of 35% with hypokinesis. He will need a repeat 2-D echo. ED has started him on Lasix, one-time dose but we will defer to nephrology is ongoing Lasix is required. Nephrology will see him in consultation.  Faye Ramsay MD [facility] Pager [phone]  If 7PM-7AM, please contact night-coverage [email or web address] Password [facility]  12/09/2016, 5:59 PM

## 2016-12-09 NOTE — ED Triage Notes (Signed)
Pt here with increased leg swelling x 1 month; pt sent here for eval; pt dialysis pt that missed treatments yesterday but went Saturday

## 2016-12-09 NOTE — ED Provider Notes (Signed)
[facility] DEPT Provider Note   CSN: [id number] Arrival date & time: 12/09/16  1227     History   Chief Complaint Chief Complaint  Patient presents with  . Leg Swelling    HPI SYMON STROHMEYER is a 71 y.o. male.  He has been having difficulty with progressive leg swelling over the last month. He is a dialysis patient with dialysis Tuesday-Thursday-Saturday. He missed his dialysis session yesterday. Over the last week, he has had a cough which is nonproductive. It has been associated with posttussive emesis. He has been using an albuterol inhaler with limited success. He denies fever or chills. He does note dyspnea which is worse with significant exertion. There is orthopnea. He continues to feel like his chest is congested. He denies chest pain, heaviness, tightness, pressure. He did see his PCP who started him on azithromycin with first dose yesterday. There's not been any improvement since then. Of note, he has not noticed any change in his leg swelling following dialysis sessions. He states his nephrologist is aware of the swelling, but it has continued to progress in spite of dialysis.   The history is provided by the patient.    Past Medical History:  Diagnosis Date  . Asthma   . Bladder cancer ([place]) dx'd 1990   surg only  . Diabetes mellitus without complication ([place])   . ESRD (end stage renal disease) ([name])    Tues, Thurs, Sat dialysis  . History of cardiac catheterization    a. LHC 5/17: no obstructive CAD  . History of DVT (deep vein thrombosis)   . History of nuclear stress test    a. Myoview 4/17: EF 39%, inf, inf-lat, apical inf, apical lat, apical scar, intermediate risk  . Hyperlipidemia   . Hypertension   . NICM (nonischemic cardiomyopathy) ([place])    a. Echo 2/17: mod LVH, EF 35-40%, inf-lat and inf-sept HK, mod MR, severe LAE, small pericardial effusion    Patient Active Problem List   Diagnosis Date Noted  . Chronic systolic CHF (congestive heart  failure) ([place]) 05/14/2016  . Essential hypertension 12/11/2015  . Type 2 diabetes mellitus ([place]) 12/11/2015  . Acute systolic CHF (congestive heart failure) ([place]) 12/06/2015  . Renal hematoma, right 12/04/2015  . CKD (chronic kidney disease), stage IV ([place]) 12/04/2015  . ARF (acute renal failure) ([place]) 12/04/2015  . Normocytic anemia 12/04/2015  . Anticoagulant long-term use 12/04/2015  . History of DVT (deep vein thrombosis) 12/04/2015  . Renal hematoma 12/04/2015  . Myalgia and myositis, unspecified 01/11/2014  . H/O: gout 11/16/2013  . Trochanteric bursitis 10/17/2013  . 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 Lumbar degenerative disc disease 07/17/2013  . Lumbosacral spondylosis without myelopathy 07/17/2013  . Hyperlipidemia 05/27/2010  . CORONARY ATHEROSCLEROSIS NATIVE CORONARY ARTERY 05/27/2010    Past Surgical History:  Procedure Laterality Date  . arthroscopic knee surgery    . AV FISTULA PLACEMENT Left 12/13/2015   Procedure: ARTERIOVENOUS (AV) FISTULA CREATION;  Surgeon: Angelia Mould, MD;  Location: [facility];  Service: Vascular;  Laterality: Left;  . BACK SURGERY     2 times  . CARDIAC CATHETERIZATION N/A 02/28/2016   Procedure: Left Heart Cath and Coronary Angiography;  Surgeon: Larey Dresser, MD;  Location: [facility];  Service: Cardiovascular;  Laterality: N/A;  . CYSTOSCOPY    . FISTULA SUPERFICIALIZATION Left 08/14/2016   Procedure: FISTULA SUPERFICIALIZATION LEFT UPPER ARM;  Surgeon: Angelia Mould, MD;  Location: [facility];  Service: Vascular;  Laterality:  Left;  . INSERTION OF DIALYSIS CATHETER N/A 12/13/2015   Procedure: INSERTION OF DIALYSIS CATHETER;  Surgeon: Angelia Mould, MD;  Location: [facility];  Service: Vascular;  Laterality: N/A;  . JOINT REPLACEMENT     hip  . LIGATION OF COMPETING BRANCHES OF ARTERIOVENOUS FISTULA Left 08/14/2016   Procedure: LIGATION OF COMPETING BRANCHES OF ARTERIOVENOUS FISTULA LEFT UPPER ARM;  Surgeon: Angelia Mould, MD;  Location: [facility];  Service: Vascular;  Laterality: Left;  . PERIPHERAL VASCULAR CATHETERIZATION Left 06/09/2016   Procedure: Fistulagram;  Surgeon: Serafina Mitchell, MD;  Location: [facility];  Service: Cardiovascular;  Laterality: Left;  ARM  . PERIPHERAL VASCULAR CATHETERIZATION Left 06/09/2016   Procedure: Peripheral Vascular Balloon Angioplasty;  Surgeon: Serafina Mitchell, MD;  Location: [facility];  Service: Cardiovascular;  Laterality: Left;  upper arm venous  . PERIPHERAL VASCULAR CATHETERIZATION Left 06/25/2016   Procedure: Fistulagram;  Surgeon: Serafina Mitchell, MD;  Location: [facility];  Service: Cardiovascular;  Laterality: Left;       Home Medications    Prior to Admission medications   Medication Sig Start Date End Date Taking? Authorizing Provider  albuterol (PROVENTIL HFA;VENTOLIN HFA) 108 (90 Base) MCG/ACT inhaler Inhale 1-2 puffs into the lungs every 6 (six) hours as needed for wheezing or shortness of breath. 12/03/16   Kinnie Feil, PA-C  allopurinol (ZYLOPRIM) 300 MG tablet Take 300 mg by mouth daily.     Historical Provider, MD  atorvastatin (LIPITOR) 40 MG tablet Take 40 mg by mouth at bedtime.  01/21/16   Historical Provider, MD  BAYER BREEZE 2 TEST DISK Apply 1 each topically daily as needed (FOR TESTING BLOOD SUGAR).  11/13/15   Historical Provider, MD  benzonatate (TESSALON PERLES) 100 MG capsule Take 1 capsule (100 mg total) by mouth 3 (three) times daily as needed for cough. 12/03/16   Kinnie Feil, PA-C  calcitRIOL (ROCALTROL) 0.5 MCG capsule Take 0.5 mcg by mouth Every Tuesday,Thursday,and Saturday with dialysis. Medication given at dialysis on Tues Thurs Sat 11/13/15   Historical Provider, MD  carvedilol (COREG) 3.125 MG tablet Take 1 tablet (3.125 mg total) by mouth See admin instructions. Take 1 tablet (3.125 mg) by mouth twice daily on Monday, Wednesday, Friday and Sunday (non-dialysis days) 08/14/16   Alvia Grove, PA-C    cyclobenzaprine (FLEXERIL) 5 MG tablet Take 5 mg by mouth 2 (two) times daily as needed for muscle spasms.     Historical Provider, MD  famotidine (PEPCID) 20 MG tablet Take 1 tablet (20 mg total) by mouth 2 (two) times daily as needed (sour stomach). 08/14/16   Alvia Grove, PA-C  fluticasone (FLONASE) 50 MCG/ACT nasal spray Place 2 sprays into both nostrils daily as needed for allergies or rhinitis.     Historical Provider, MD  glipiZIDE (GLUCOTROL) 5 MG tablet Take 0.5 tablets (2.5 mg total) by mouth 2 (two) times daily. 08/14/16   Alvia Grove, PA-C  Loratadine-Pseudoephedrine (CLARITIN-D 24 HOUR PO) Take 1 tablet by mouth daily. PATIENT NOT SURE OF THE DOSAGE    Historical Provider, MD  multivitamin (RENA-VIT) TABS tablet Take 1 tablet by mouth at bedtime.  12/20/15   Historical Provider, MD  oseltamivir (TAMIFLU) 30 MG capsule Take 1 capsule (30 mg total) by mouth daily. Take 30 mg after every hemodialysis session for 5 days total 12/03/16   Kinnie Feil, PA-C  oxyCODONE-acetaminophen (PERCOCET) 7.5-325 MG tablet Take 1 tablet by mouth  3 (three) times daily as needed (pain). Patient not taking: Reported on 09/16/2016 08/14/16   Alvia Grove, PA-C  sevelamer carbonate (RENVELA) 800 MG tablet Take 2 tablets (1,600 mg total) by mouth 3 (three) times daily with meals. 12/18/15   Geradine Girt, DO    Family History Family History  Problem Relation Age of Onset  . Arthritis Mother   . Cancer Father   . Heart disease Father   . Hypertension Sister   . Alcohol abuse Brother   . Diabetes Daughter   . Heart attack Paternal Uncle     Social History Social History  Substance Use Topics  . Smoking status: Former Research scientist (life sciences)  . Smokeless tobacco: Never Used  . Alcohol use No     Comment: Used to drink on the weekends, quit 2 years ago     Allergies   Meperidine; Sulfa antibiotics; Sulfonamide derivatives; Eggs or egg-derived products; Foltx [elfolate plus]; Lac bovis; Lactose  intolerance (gi); Milk-related compounds; Penicillins; and Betadine [povidone iodine]   Review of Systems Review of Systems  All other systems reviewed and are negative.    Physical Exam Updated Vital Signs BP (!) 151/103   Pulse 89   Temp 98.1 F (36.7 C) (Oral)   Resp 21   SpO2 100%   Physical Exam  Nursing note and vitals reviewed.  71 year old male, resting comfortably and in no acute distress. Vital signs are significant for hypertension. Oxygen saturation is 100%, which is normal. Head is normocephalic and atraumatic. PERRLA, EOMI. Oropharynx is clear. Neck is nontender and supple without adenopathy. JVD is present. Back is nontender and there is no CVA tenderness. Lungs have minimal bibasilar rales. Coarse expiratory rhonchi are present throughout. Chest is nontender. Dialysis access catheter present right upper chest. Heart has regular rate and rhythm without murmur. Abdomen is soft, flat, nontender without masses or hepatosplenomegaly and peristalsis is normoactive. Extremities have 3+ pretibial edema, with 1+ presacral edema. Full range of motion is present. Skin is warm and dry without rash. Neurologic: Mental status is normal, cranial nerves are intact, there are no motor or sensory deficits.  ED Treatments / Results  Labs (all labs ordered are listed, but only abnormal results are displayed) Labs Reviewed  BASIC METABOLIC PANEL - Abnormal; Notable for the following:       Result Value   Chloride 96 (*)    CO2 21 (*)    Glucose, Bld 114 (*)    BUN 73 (*)    Creatinine, Ser 7.83 (*)    Calcium 8.2 (*)    GFR calc non Af Amer 6 (*)    GFR calc Af Amer 7 (*)    Anion gap 19 (*)    All other components within normal limits  CBC - Abnormal; Notable for the following:    RBC 4.01 (*)    Hemoglobin 11.7 (*)    HCT 35.5 (*)    RDW 15.7 (*)    Platelets 146 (*)    All other components within normal limits  BRAIN NATRIURETIC PEPTIDE - Abnormal; Notable for  the following:    B Natriuretic Peptide 3,698.3 (*)    All other components within normal limits  I-STAT TROPOININ, ED - Abnormal; Notable for the following:    Troponin i, poc 0.09 (*)    All other components within normal limits  I-STAT TROPOININ, ED - Abnormal; Notable for the following:    Troponin i, poc 0.10 (*)    All  other components within normal limits    EKG  EKG Interpretation  Date/Time:  Wednesday December 09 2016 12:38:21 EST Ventricular Rate:  82 PR Interval:  178 QRS Duration: 108 QT Interval:  446 QTC Calculation: 521 R Axis:   -23 Text Interpretation:  Sinus rhythm with frequent and consecutive Premature ventricular complexes Minimal voltage criteria for LVH, may be normal variant Anterior infarct , age undetermined Prolonged QT Abnormal ECG When compared with ECG of 05/13/2016, No significant change was found Confirmed by [facility]  MD, Kailah Pennel ([id number]) on 12/09/2016 4:53:30 PM       Radiology Dg Chest 2 View  Result Date: 12/09/2016 CLINICAL DATA:  Lower extremity swelling for 1 month. Shortness of breath. EXAM: CHEST  2 VIEW COMPARISON:  12/27/2015 FINDINGS: Dialysis catheter tip:  SVC. Tortuous thoracic aorta. Mild enlargement of the cardiopericardial silhouette. Stable scarring at the right lung base. Mild thoracic spondylosis. Upper zone pulmonary vascular prominence. IMPRESSION: 1. Mild enlargement of the cardiopericardial silhouette with prominent upper zone pulmonary vasculature suggesting cephalization of blood flow compatible with pulmonary venous hypertension. 2. Stable scarring at the right lung base. 3. Tortuous thoracic aorta. Electronically Signed   By: Van Clines M.D.   On: 12/09/2016 12:53    Procedures Procedures (including critical care time)  Medications Ordered in ED Medications  furosemide (LASIX) 200 mg in dextrose 5 % 50 mL IVPB (not administered)  aspirin chewable tablet 324 mg (not administered)  ipratropium-albuterol (DUONEB) 0.5-2.5  (3) MG/3ML nebulizer solution 3 mL (3 mLs Nebulization Given 12/09/16 1528)     Initial Impression / Assessment and Plan / ED Course  I have reviewed the triage vital signs and the nursing notes.  Pertinent labs & imaging results that were available during my care of the patient were reviewed by me and considered in my medical decision making (see chart for details).  Progressive edema in spite of on dialysis. Old records are reviewed, and he did have an ED visit 6 days ago for an influenza-like illness which was treated with albuterol inhaler, benzonatate, oseltamivir. He was noted to have 2+ edema on that visit. Cardiology office visit in June stated he had no edema. Troponin is noted to be mildly elevated. This will need to be repeated. BNP is markedly elevated which is, at least partially, related to renal failure. He has a known nonischemic cardiomyopathy with ejection fraction of 35-40%. Potassium is normal. No indication for emergent dialysis. He will be given albuterol with ipratropium. May need to consider short course of prednisone for respiratory symptoms. I suspect he will need more aggressive dialysis to his edema.  Respiratory status actually got worse with albuterol with ipratropium. Repeat troponin is slightly higher than original (0.09-0.10). He was ambulated and oxygen saturation dropped into the 80s. With the rising troponin and desaturation with ambulation, I did not feel he should be discharged. Is given aspirin. IV is started and he will be given a dose of furosemide (he does urinate 2-3 times a day). Case is discussed with Dr. Doyle Askew of [facility], who agrees to admit the patient.  Final Clinical Impressions(s) / ED Diagnoses   Final diagnoses:  Acute on chronic systolic CHF (congestive heart failure) ([name])  Elevated troponin I level  End-stage renal disease on hemodialysis ([place])  Anemia associated with chronic renal failure    New Prescriptions New Prescriptions     No medications on file     Delora Fuel, MD [id number]

## 2016-12-09 NOTE — ED Notes (Signed)
Notified dr Vanita Panda of troponin 0.09

## 2016-12-09 NOTE — ED Notes (Signed)
I-stat troponin result given to Dr. Roxanne Mins

## 2016-12-09 NOTE — ED Notes (Signed)
Pt O2 was going between 80-90% while ambulating.

## 2016-12-10 ENCOUNTER — Inpatient Hospital Stay (HOSPITAL_COMMUNITY): Payer: Medicare Other

## 2016-12-10 DIAGNOSIS — Z992 Dependence on renal dialysis: Secondary | ICD-10-CM

## 2016-12-10 LAB — BASIC METABOLIC PANEL
Anion gap: 19 — ABNORMAL HIGH (ref 5–15)
BUN: 80 mg/dL — AB (ref 6–20)
CALCIUM: 8.6 mg/dL — AB (ref 8.9–10.3)
CO2: 19 mmol/L — ABNORMAL LOW (ref 22–32)
CREATININE: 8.25 mg/dL — AB (ref 0.61–1.24)
Chloride: 101 mmol/L (ref 101–111)
GFR calc Af Amer: 7 mL/min — ABNORMAL LOW (ref >60)
GFR, EST NON AFRICAN AMERICAN: 6 mL/min — AB (ref >60)
GLUCOSE: 155 mg/dL — AB (ref 65–99)
Potassium: 4.2 mmol/L (ref 3.5–5.1)
SODIUM: 139 mmol/L (ref 135–145)

## 2016-12-10 LAB — GLUCOSE, CAPILLARY
GLUCOSE-CAPILLARY: 174 mg/dL — AB (ref 65–99)
Glucose-Capillary: 183 mg/dL — ABNORMAL HIGH (ref 65–99)
Glucose-Capillary: 173 mg/dL — ABNORMAL HIGH (ref 65–99)
Glucose-Capillary: 158 mg/dL — ABNORMAL HIGH (ref 65–99)

## 2016-12-10 LAB — MRSA PCR SCREENING: MRSA BY PCR: POSITIVE — AB

## 2016-12-10 LAB — CBC
HEMATOCRIT: 36.9 % — AB (ref 39.0–52.0)
HEMOGLOBIN: 12.3 g/dL — AB (ref 13.0–17.0)
MCH: 29.6 pg (ref 26.0–34.0)
MCHC: 33.3 g/dL (ref 30.0–36.0)
MCV: 88.9 fL (ref 78.0–100.0)
Platelets: 187 10*3/uL (ref 150–400)
RBC: 4.15 MIL/uL — AB (ref 4.22–5.81)
RDW: 15.7 % — ABNORMAL HIGH (ref 11.5–15.5)
WBC: 8.1 10*3/uL (ref 4.0–10.5)

## 2016-12-10 MED ORDER — BISACODYL 10 MG RE SUPP
10.0000 mg | Freq: Every day | RECTAL | Status: DC | PRN
  Administered 2016-12-10: 10 mg via RECTAL
  Filled 2016-12-10: qty 1

## 2016-12-10 MED ORDER — LIDOCAINE HCL (PF) 1 % IJ SOLN: 5.0000 mL | INTRAMUSCULAR | Status: DC | PRN

## 2016-12-10 MED ORDER — SENNOSIDES-DOCUSATE SODIUM 8.6-50 MG PO TABS
2.0000 | ORAL_TABLET | Freq: Two times a day (BID) | ORAL | Status: DC
  Administered 2016-12-10 – 2016-12-18 (×15): 2 via ORAL
  Filled 2016-12-10 (×15): qty 2

## 2016-12-10 MED ORDER — PRO-STAT SUGAR FREE PO LIQD
30.0000 mL | Freq: Two times a day (BID) | ORAL | Status: DC
Start: 2016-12-10 — End: 2016-12-18
  Administered 2016-12-11 – 2016-12-17 (×4): 30 mL via ORAL
  Filled 2016-12-10 (×12): qty 30

## 2016-12-10 MED ORDER — HEPARIN SODIUM (PORCINE) 1000 UNIT/ML DIALYSIS
20.0000 [IU]/kg | INTRAMUSCULAR | Status: DC | PRN
  Filled 2016-12-10: qty 2

## 2016-12-10 MED ORDER — SENNOSIDES-DOCUSATE SODIUM 8.6-50 MG PO TABS: 2.0000 | ORAL_TABLET | Freq: Two times a day (BID) | ORAL | Status: DC

## 2016-12-10 MED ORDER — OXYCODONE-ACETAMINOPHEN 7.5-325 MG PO TABS
1.0000 | ORAL_TABLET | Freq: Once | ORAL | Status: AC
  Administered 2016-12-10: 1 via ORAL

## 2016-12-10 MED ORDER — CHLORHEXIDINE GLUCONATE CLOTH 2 % EX PADS
6.0000 | MEDICATED_PAD | Freq: Every day | CUTANEOUS | Status: AC
  Administered 2016-12-10 – 2016-12-14 (×5): 6 via TOPICAL

## 2016-12-10 MED ORDER — HEPARIN SODIUM (PORCINE) 1000 UNIT/ML DIALYSIS
1000.0000 [IU] | INTRAMUSCULAR | Status: DC | PRN
  Filled 2016-12-10: qty 1

## 2016-12-10 MED ORDER — MUPIROCIN 2 % EX OINT
1.0000 "application " | TOPICAL_OINTMENT | Freq: Two times a day (BID) | CUTANEOUS | Status: AC
  Administered 2016-12-10 – 2016-12-14 (×9): 1 via NASAL
  Filled 2016-12-10 (×2): qty 22

## 2016-12-10 MED ORDER — LIDOCAINE-PRILOCAINE 2.5-2.5 % EX CREA: 1.0000 "application " | TOPICAL_CREAM | CUTANEOUS | Status: DC | PRN

## 2016-12-10 MED ORDER — SODIUM CHLORIDE 0.9 % IV SOLN: 100.0000 mL | INTRAVENOUS | Status: DC | PRN

## 2016-12-10 MED ORDER — PENTAFLUOROPROP-TETRAFLUOROETH EX AERO: 1.0000 "application " | INHALATION_SPRAY | CUTANEOUS | Status: DC | PRN

## 2016-12-10 MED ORDER — CALCITRIOL 0.5 MCG PO CAPS
1.0000 ug | ORAL_CAPSULE | ORAL | Status: DC
Start: 2016-12-12 — End: 2016-12-18
  Administered 2016-12-12 – 2016-12-17 (×4): 1 ug via ORAL
  Filled 2016-12-10 (×2): qty 4

## 2016-12-10 MED ORDER — BOOST / RESOURCE BREEZE PO LIQD
1.0000 | Freq: Two times a day (BID) | ORAL | Status: DC
  Administered 2016-12-11 – 2016-12-12 (×4): 1 via ORAL

## 2016-12-10 MED ORDER — ALTEPLASE 2 MG IJ SOLR: 2.0000 mg | Freq: Once | INTRAMUSCULAR | Status: DC | PRN

## 2016-12-10 NOTE — Progress Notes (Signed)
Pt transferred [id number] ,VSS, report called to [facility] RN,pt on  continuous 3 way irrigation foley for bladder,foley shows clear fluid,pt alert and oriented x 3.

## 2016-12-10 NOTE — CV Procedure (Signed)
Attempted Echocardiogram on 3/1 at 2:20 Pm, but Albert Stanley is unable to tolerate the exam at this time. He is unable to sit or lay back for the exam to be completed due to his pain level. We will attempt the echo tomorrow.  Darlina Sicilian RDCS

## 2016-12-10 NOTE — Progress Notes (Signed)
PT Cancellation Note  Patient Details Name: Albert Stanley MRN: [id number] DOB: Nov 06, 1945   Cancelled Treatment:    Reason Eval/Treat Not Completed: Medical issues which prohibited therapy;Patient at procedure or test/unavailable.  Pt in 2 or multiple procedures he has had today upon my arrival and has been in significant pain otherwise.  Will see pt as able 3/2. 12/10/2016  Donnella Sham, [place] [phone]  (pager)   Tessie Fass Randol Zumstein 12/10/2016, 5:06 PM

## 2016-12-10 NOTE — Consult Note (Signed)
Urology Consult   Physician requesting consult: Dr. Mart Piggs  Reason for consult: Difficult urethral catheterization  History of Present Illness: Albert Stanley is a 71 y.o. followed by Dr. Risa Grill for low risk bladder cancer.  He was admitted to the hospital tonight with a CHF exacerbation and has been undergoing diuresis.  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  He has been unable to void since starting his diuresis and developed suprapubic pressure.  Bladder scan revealed > 500 cc.  He has noted some increased difficulty voiding over the past 3 weeks but has always been able to void.  He denies any gross hematuria since his last surveillance cystoscopy by Dr. Risa Grill in June 2017.  Multiple attempts to place a urethral catheter by nursing have been unsuccessful.   Past Medical History:  Diagnosis Date  . Asthma   . Bladder cancer ([place]) dx'd 1990   surg only  . Diabetes mellitus without complication ([place])   . ESRD (end stage renal disease) ([name])    Tues, Thurs, Sat dialysis  . History of cardiac catheterization    a. LHC 5/17: no obstructive CAD  . History of DVT (deep vein thrombosis)   . History of nuclear stress test    a. Myoview 4/17: EF 39%, inf, inf-lat, apical inf, apical lat, apical scar, intermediate risk  . Hyperlipidemia   . Hypertension   . NICM (nonischemic cardiomyopathy) ([place])    a. Echo 2/17: mod LVH, EF 35-40%, inf-lat and inf-sept HK, mod MR, severe LAE, small pericardial effusion    Past Surgical History:  Procedure Laterality Date  . arthroscopic knee surgery    . AV FISTULA PLACEMENT Left 12/13/2015   Procedure: ARTERIOVENOUS (AV) FISTULA CREATION;  Surgeon: Angelia Mould, MD;  Location: [place];  Service: Vascular;  Laterality: Left;  . BACK SURGERY     2 times  . CARDIAC CATHETERIZATION N/A 02/28/2016   Procedure: Left Heart Cath and Coronary Angiography;  Surgeon: Larey Dresser, MD;   Location: [facility];  Service: Cardiovascular;  Laterality: N/A;  . CYSTOSCOPY    . FISTULA SUPERFICIALIZATION Left 08/14/2016   Procedure: FISTULA SUPERFICIALIZATION LEFT UPPER ARM;  Surgeon: Angelia Mould, MD;  Location: [place];  Service: Vascular;  Laterality: Left;  . INSERTION OF DIALYSIS CATHETER N/A 12/13/2015   Procedure: INSERTION OF DIALYSIS CATHETER;  Surgeon: Angelia Mould, MD;  Location: [place];  Service: Vascular;  Laterality: N/A;  . JOINT REPLACEMENT     hip  . LIGATION OF COMPETING BRANCHES OF ARTERIOVENOUS FISTULA Left 08/14/2016   Procedure: LIGATION OF COMPETING BRANCHES OF ARTERIOVENOUS FISTULA LEFT UPPER ARM;  Surgeon: Angelia Mould, MD;  Location: [place];  Service: Vascular;  Laterality: Left;  . PERIPHERAL VASCULAR CATHETERIZATION Left 06/09/2016   Procedure: Fistulagram;  Surgeon: Serafina Mitchell, MD;  Location: [facility];  Service: Cardiovascular;  Laterality: Left;  ARM  . PERIPHERAL VASCULAR CATHETERIZATION Left 06/09/2016   Procedure: Peripheral Vascular Balloon Angioplasty;  Surgeon: Serafina Mitchell, MD;  Location: [facility];  Service: Cardiovascular;  Laterality: Left;  upper arm venous  . PERIPHERAL VASCULAR CATHETERIZATION Left 06/25/2016   Procedure: Fistulagram;  Surgeon: Serafina Mitchell, MD;  Location: [facility];  Service: Cardiovascular;  Laterality: Left;    Medications:  Home meds:  Current Meds  Medication Sig  . albuterol (PROVENTIL HFA;VENTOLIN HFA) 108 (90 Base) MCG/ACT inhaler Inhale  1-2 puffs into the lungs every 6 (six) hours as needed for wheezing or shortness of breath.  . allopurinol (ZYLOPRIM) 300 MG tablet Take 300 mg by mouth daily.   Marland Kitchen atorvastatin (LIPITOR) 40 MG tablet Take 40 mg by mouth at bedtime.   Marland Kitchen BAYER BREEZE 2 TEST DISK Apply 1 each topically daily as needed (FOR TESTING BLOOD SUGAR).   Marland Kitchen benzonatate (TESSALON PERLES) 100 MG capsule Take 1 capsule (100 mg total) by mouth 3  (three) times daily as needed for cough.  . calcitRIOL (ROCALTROL) 0.5 MCG capsule Take 0.5 mcg by mouth Every Tuesday,Thursday,and Saturday with dialysis. Medication given at dialysis on Tues Thurs Sat  . carvedilol (COREG) 3.125 MG tablet Take 1 tablet (3.125 mg total) by mouth See admin instructions. Take 1 tablet (3.125 mg) by mouth twice daily on Monday, Wednesday, Friday and Sunday (non-dialysis days)  . cyclobenzaprine (FLEXERIL) 5 MG tablet Take 5 mg by mouth 2 (two) times daily as needed for muscle spasms.   . famotidine (PEPCID) 20 MG tablet Take 1 tablet (20 mg total) by mouth 2 (two) times daily as needed (sour stomach).  . fluticasone (FLONASE) 50 MCG/ACT nasal spray Place 2 sprays into both nostrils daily as needed for allergies or rhinitis.   Marland Kitchen glipiZIDE (GLUCOTROL) 5 MG tablet Take 0.5 tablets (2.5 mg total) by mouth 2 (two) times daily.  . Loratadine-Pseudoephedrine (CLARITIN-D 24 HOUR PO) Take 1 tablet by mouth daily. PATIENT NOT SURE OF THE DOSAGE  . multivitamin (RENA-VIT) TABS tablet Take 1 tablet by mouth at bedtime.   Marland Kitchen oseltamivir (TAMIFLU) 30 MG capsule Take 1 capsule (30 mg total) by mouth daily. Take 30 mg after every hemodialysis session for 5 days total  . oxyCODONE-acetaminophen (PERCOCET) 7.5-325 MG tablet Take 1 tablet by mouth 3 (three) times daily as needed (pain).  Marland Kitchen sevelamer carbonate (RENVELA) 800 MG tablet Take 2 tablets (1,600 mg total) by mouth 3 (three) times daily with meals.    Scheduled Meds: . allopurinol  300 mg Oral Daily  . atorvastatin  40 mg Oral QHS  . calcitRIOL  0.5 mcg Oral Q T,Th,Sa-HD  . [START ON 12/11/2016] carvedilol  3.125 mg Oral 2 times per day on Sun Mon Wed Fri  . Chlorhexidine Gluconate Cloth  6 each Topical Q0600  . enoxaparin (LOVENOX) injection  30 mg Subcutaneous Q24H  . glipiZIDE  2.5 mg Oral BID AC  . insulin aspart  0-9 Units Subcutaneous TID WC  . loratadine  10 mg Oral Daily  . multivitamin  1 tablet Oral QHS  .  mupirocin ointment  1 application Nasal BID  . sevelamer carbonate  1,600 mg Oral TID WC   Continuous Infusions: PRN Meds:.acetaminophen **OR** acetaminophen, albuterol, benzonatate, cyclobenzaprine, famotidine, fluticasone, guaiFENesin-dextromethorphan, ondansetron **OR** ondansetron (ZOFRAN) IV, oxyCODONE-acetaminophen  Allergies:  Allergies  Allergen Reactions  . Meperidine Shortness Of Breath and Other (See Comments)    Increased heart rate Reaction to demerol  . Sulfa Antibiotics Shortness Of Breath and Other (See Comments)    Increased heart rate  . Sulfonamide Derivatives Shortness Of Breath and Other (See Comments)    Increased heart rate  . Eggs Or Egg-Derived Products Nausea And Vomiting     Reaction to egg yolks - not whites    . Foltx [Elfolate Plus] Other (See Comments)    Pt does not recall reaction to this  . Lac Bovis Diarrhea, Nausea And Vomiting and Other (See Comments)    Constipation Reaction to whole milk  .  Lactose Intolerance (Gi) Diarrhea, Nausea And Vomiting and Other (See Comments)    Constipation Reaction to whole milk  . Milk-Related Compounds Diarrhea, Nausea And Vomiting and Other (See Comments)    Constipation Reaction to whole milk  . Penicillins     "Knocks me out."   Has patient had a PCN reaction causing immediate rash, facial/tongue/throat swelling, SOB or lightheadedness with hypotension: yes- blacked out Has patient had a PCN reaction causing severe rash involving mucus membranes or skin necrosis: unknown Has patient had a PCN reaction that required hospitalization: at MD office Has patient had a PCN reaction occurring within the last 10 years: no If all of the above answers are "NO", then may proceed with Cephalosporin use.   . Betadine [Povidone Iodine] Itching, Rash and Other (See Comments)    burning    Family History  Problem Relation Age of Onset  . Arthritis Mother   . Cancer Father   . Heart disease Father   . Hypertension  Sister   . Alcohol abuse Brother   . Diabetes Daughter   . Heart attack Paternal Uncle     Social History:  reports that he has quit smoking. He has never used smokeless tobacco. He reports that he does not drink alcohol or use drugs.  ROS: A complete review of systems was performed.  All systems are negative except for pertinent findings as noted.  Physical Exam:  Vital signs in last 24 hours: Temp:  [97.3 F (36.3 C)-98.1 F (36.7 C)] 97.3 F (36.3 C) (02/28 2130) Pulse Rate:  [42-89] 59 (02/28 2130) Resp:  [11-21] 16 (02/28 2130) BP: (143-164)/(73-110) 152/110 (02/28 2130) SpO2:  [97 %-100 %] 100 % (02/28 2130) Weight:  [96.1 kg (211 lb 14.4 oz)] 96.1 kg (211 lb 14.4 oz) (02/28 2130) Constitutional:  Alert and oriented, No acute distress Cardiovascular: Regular rate and rhythm, No JVD Respiratory: Normal respiratory effort GI: Suprapubic tenderness and distention noted. Genitourinary: No CVAT. Normal male phallus, testes are descended bilaterally and non-tender and without masses, scrotum is normal in appearance without lesions or masses, perineum is normal on inspection. Lymphatic: No lymphadenopathy Neurologic: Grossly intact, no focal deficits Psychiatric: Normal mood and affect  Laboratory Data:   Recent Labs  12/09/16 1201 12/09/16 2145  WBC 5.0 4.8  HGB 11.7* 13.3  HCT 35.5* 36.0*  PLT 146* 138*     Recent Labs  12/09/16 1201 12/09/16 2145  NA 136  --   K 3.9  --   CL 96*  --   GLUCOSE 114*  --   BUN 73*  --   CALCIUM 8.2*  --   CREATININE 7.83* 8.02*     Results for orders placed or performed during the hospital encounter of 12/09/16 (from the past 24 hour(s))  Basic metabolic panel     Status: Abnormal   Collection Time: 12/09/16 12:01 PM  Result Value Ref Range   Sodium 136 135 - 145 mmol/L   Potassium 3.9 3.5 - 5.1 mmol/L   Chloride 96 (L) 101 - 111 mmol/L   CO2 21 (L) 22 - 32 mmol/L   Glucose, Bld 114 (H) 65 - 99 mg/dL   BUN 73 (H) 6 -  20 mg/dL   Creatinine, Ser 7.83 (H) 0.61 - 1.24 mg/dL   Calcium 8.2 (L) 8.9 - 10.3 mg/dL   GFR calc non Af Amer 6 (L) >60 mL/min   GFR calc Af Amer 7 (L) >60 mL/min   Anion gap 19 (H) 5 -  15  CBC     Status: Abnormal   Collection Time: 12/09/16 12:01 PM  Result Value Ref Range   WBC 5.0 4.0 - 10.5 K/uL   RBC 4.01 (L) 4.22 - 5.81 MIL/uL   Hemoglobin 11.7 (L) 13.0 - 17.0 g/dL   HCT 35.5 (L) 39.0 - 52.0 %   MCV 88.5 78.0 - 100.0 fL   MCH 29.2 26.0 - 34.0 pg   MCHC 33.0 30.0 - 36.0 g/dL   RDW 15.7 (H) 11.5 - 15.5 %   Platelets 146 (L) 150 - 400 K/uL  Brain natriuretic peptide     Status: Abnormal   Collection Time: 12/09/16 12:37 PM  Result Value Ref Range   B Natriuretic Peptide 3,698.3 (H) 0.0 - 100.0 pg/mL  I-stat troponin, ED     Status: Abnormal   Collection Time: 12/09/16  1:19 PM  Result Value Ref Range   Troponin i, poc 0.09 (HH) 0.00 - 0.08 ng/mL   Comment NOTIFIED PHYSICIAN    Comment 3          I-stat troponin, ED     Status: Abnormal   Collection Time: 12/09/16  5:01 PM  Result Value Ref Range   Troponin i, poc 0.10 (HH) 0.00 - 0.08 ng/mL   Comment NOTIFIED PHYSICIAN    Comment 3          Glucose, capillary     Status: Abnormal   Collection Time: 12/09/16  9:32 PM  Result Value Ref Range   Glucose-Capillary 118 (H) 65 - 99 mg/dL  MRSA PCR Screening     Status: Abnormal   Collection Time: 12/09/16  9:39 PM  Result Value Ref Range   MRSA by PCR POSITIVE (A) NEGATIVE  CBC     Status: Abnormal   Collection Time: 12/09/16  9:45 PM  Result Value Ref Range   WBC 4.8 4.0 - 10.5 K/uL   RBC 4.04 (L) 4.22 - 5.81 MIL/uL   Hemoglobin 13.3 13.0 - 17.0 g/dL   HCT 36.0 (L) 39.0 - 52.0 %   MCV 89.1 78.0 - 100.0 fL   MCH 32.9 26.0 - 34.0 pg   MCHC 36.9 (H) 30.0 - 36.0 g/dL   RDW 15.7 (H) 11.5 - 15.5 %   Platelets 138 (L) 150 - 400 K/uL  Creatinine, serum     Status: Abnormal   Collection Time: 12/09/16  9:45 PM  Result Value Ref Range   Creatinine, Ser 8.02 (H) 0.61 -  1.24 mg/dL   GFR calc non Af Amer 6 (L) >60 mL/min   GFR calc Af Amer 7 (L) >60 mL/min   Recent Results (from the past 240 hour(s))  MRSA PCR Screening     Status: Abnormal   Collection Time: 12/09/16  9:39 PM  Result Value Ref Range Status   MRSA by PCR POSITIVE (A) NEGATIVE Final    Comment:        The GeneXpert MRSA Assay (FDA approved for NASAL specimens only), is one component of a comprehensive MRSA colonization surveillance program. It is not intended to diagnose MRSA infection nor to guide or monitor treatment for MRSA infections. RESULT CALLED TO, READ BACK BY AND VERIFIED WITH: E CASTRO,RN @0012  12/10/16 MKELLY,MLT     Renal Function:  Recent Labs  12/03/16 1450 12/09/16 1201 12/09/16 2145  CREATININE 6.77* 7.83* 8.02*   Estimated Creatinine Clearance: 10 mL/min (by C-G formula based on SCr of 8.02 mg/dL (H)).  Procedure:  He was prepped with betadine and  a 16 Fr coude catheter was inserted into the urethra and into the bladder without resistance.  There was return of > 500 cc of grossly clear urine.  Impression/Recommendation Successful placement of urethral catheter. - D/C catheter and voiding trial once catheter is no longer medically necessary.  Please call Dr. Risa Grill if further questions or concerns.  Buffi Ewton,LES 12/10/2016, 2:46 AM    Pryor Curia MD  CC: Dr. Mart Piggs

## 2016-12-10 NOTE — Progress Notes (Signed)
PROGRESS NOTE    Albert Stanley  [id number] DOB: [birth year] DOA: 12/09/2016 PCP: [name], MD    Brief Narrative: Albert Stanley is a 71 y.o. male with known ESRD on HD TTS, DM type II with complications of nephropathy, HTN, presented to [facility] ED with main concern of several days duration of progressively worsening dyspnea that initially started with exertion and has progressed to dyspnea at rest. He was found to be fluid overloaded, and was admitted for HD. He was also complaining of urinary retention and had traumatic foley catheter placement on 2/28 and had >500 ml of bloody urine. He continues to have intermittent bladder spasms. Urology consulted.    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  Slight improvement with IV lasix.  He appears comfortable on RA, plan for HD tomorrow when he is more comfortable from the bladder spasms.  Daily weights and strict intake and output.    ESRD ON HD: as per renal.   Diabetes mellitus: with nephropathy.  CBG (last 3)   Recent Labs  12/09/16 2132 12/10/16 0744 12/10/16 1214  GLUCAP 118* 174* 183*    Holding oral meds.    Thrombocytopenia: monitor platelet count.    Urinary retention with traumatic foley placement with bloody urine , and associated bladder spasms.  Urology consulted , will probably need bladder irrigation.  Monitor H&H.   ? Constipation: stool softeners ordered.   DVT prophylaxis: scd's Code Status: (Full) Family Communication: wife at bedside.  Disposition Plan: pending further  eval.   Consultants:   Urology   Nephrology.    Procedures: hd.    Antimicrobials: None.   Subjective: Abdominal cramps.   Objective: Vitals:   12/09/16 1842 12/09/16 2130 12/10/16 0554 12/10/16 0940  BP: 146/90 (!) 152/110 (!) 154/82 (!) 145/90  Pulse: (!) 42 (!) 59 88 62  Resp: 13 16 16 16   Temp:  97.3 F (36.3 C) 98.4 F (36.9 C) 97.6 F (36.4 C)  TempSrc:  Oral Oral Oral  SpO2: 99% 100% 100% 100%  Weight:  96.1 kg (211 lb 14.4 oz)      Intake/Output Summary (Last 24 hours) at 12/10/16 1517 Last data filed at 12/10/16 1300  Gross per 24 hour  Intake              360 ml  Output              850 ml  Net             -490 ml   Filed Weights   12/09/16 2130  Weight: 96.1 kg (211 lb 14.4 oz)    Examination:  General exam: Appears  Uncomfortable, in mild discomfort.  Respiratory system: Clear to auscultation. Respiratory effort normal. Cardiovascular system: S1 & S2 heard, RRR. No JVD, murmurs, rubs, gallops or clicks. No pedal edema. Gastrointestinal system: lower abd tenderness, Normal bowel sounds heard. Central nervous system: Alert and oriented. No focal neurological deficits. Extremities: Symmetric 5 x 5 power.     Data Reviewed: I have personally reviewed following labs and  imaging studies  CBC:  Recent Labs Lab 12/09/16 1201 12/09/16 2145 12/10/16 0515  WBC 5.0 4.8 8.1  HGB 11.7* 13.3 12.3*  HCT 35.5* 36.0* 36.9*  MCV 88.5 89.1 88.9  PLT 146* 138* [id number]   Basic Metabolic Panel:  Recent Labs Lab 12/09/16 1201 12/09/16 2145 12/10/16 0515  NA 136  --  139  K 3.9  --  4.2  CL 96*  --  101  CO2 21*  --  19*  GLUCOSE 114*  --  155*  BUN 73*  --  80*  CREATININE 7.83* 8.02* 8.25*  CALCIUM 8.2*  --  8.6*   GFR: Estimated Creatinine Clearance: 9.7 mL/min (by C-G formula based on SCr of 8.25 mg/dL (H)). Liver Function Tests: No results for input(s): AST, ALT, ALKPHOS, BILITOT, PROT, ALBUMIN in the last 168 hours. No results for  input(s): LIPASE, AMYLASE in the last 168 hours. No results for input(s): AMMONIA in the last 168 hours. Coagulation Profile: No results for input(s): INR, PROTIME in the last 168 hours. Cardiac Enzymes: No results for input(s): CKTOTAL, CKMB, CKMBINDEX, TROPONINI in the last 168 hours. BNP (last 3 results) No results for input(s): PROBNP in the last 8760 hours. HbA1C: No results for input(s): HGBA1C in the last 72 hours. CBG:  Recent Labs Lab 12/09/16 2132 12/10/16 0744 12/10/16 1214  GLUCAP 118* 174* 183*   Lipid Profile: No results for input(s): CHOL, HDL, LDLCALC, TRIG, CHOLHDL, LDLDIRECT in the last 72 hours. Thyroid Function Tests: No results for input(s): TSH, T4TOTAL, FREET4, T3FREE, THYROIDAB in the last 72 hours. Anemia Panel: No results for input(s): VITAMINB12, FOLATE, FERRITIN, TIBC, IRON, RETICCTPCT in the last 72 hours. Sepsis Labs: No results for input(s): PROCALCITON, LATICACIDVEN in the last 168 hours.  Recent Results (from the past 240 hour(s))  MRSA PCR Screening     Status: Abnormal   Collection Time: 12/09/16  9:39 PM  Result Value Ref Range Status   MRSA by PCR POSITIVE (A) NEGATIVE Final    Comment:        The GeneXpert MRSA Assay (FDA approved for NASAL specimens only), is one component of a comprehensive MRSA colonization surveillance program. It is not intended to diagnose MRSA infection nor to guide or monitor treatment for MRSA infections. RESULT CALLED TO, READ BACK BY AND VERIFIED WITH: E CASTRO,RN @0012  12/10/16 MKELLY,MLT          Radiology Studies: Dg Chest 2 View  Result Date: 12/09/2016 CLINICAL DATA:  Lower extremity swelling for 1 month. Shortness of breath. EXAM: CHEST  2 VIEW COMPARISON:  12/27/2015 FINDINGS: Dialysis catheter tip:  SVC. Tortuous thoracic aorta. Mild enlargement of the cardiopericardial silhouette. Stable scarring at the right lung base. Mild thoracic spondylosis. Upper zone pulmonary vascular prominence.  IMPRESSION: 1. Mild enlargement of the cardiopericardial silhouette with prominent upper zone pulmonary vasculature suggesting cephalization of blood flow compatible with pulmonary venous hypertension. 2. Stable scarring at the right lung base. 3. Tortuous thoracic aorta. Electronically Signed   By: Van Clines M.D.   On: 12/09/2016 12:53   Dg Abd 2 Views  Result Date: 12/10/2016 CLINICAL DATA:  Abdominal pain for 2 days EXAM: ABDOMEN - 2 VIEW COMPARISON:  None. FINDINGS: Scattered large and small bowel gas is noted. Fecal material is noted throughout the colon without obstructive change. No abnormal mass or abnormal calcifications are seen. No free air is noted. Degenerative changes of the lumbar spine are seen. Left hip replacement is noted. IMPRESSION: Mild constipation although no  obstructive changes are seen. Electronically Signed   By: Inez Catalina M.D.   On: 12/10/2016 13:34        Scheduled Meds: . allopurinol  300 mg Oral Daily  . atorvastatin  40 mg Oral QHS  . [START ON 12/12/2016] calcitRIOL  1 mcg Oral Q T,Th,Sa-HD  . [START ON 12/11/2016] carvedilol  3.125 mg Oral 2 times per day on Sun Mon Wed Fri  . Chlorhexidine Gluconate Cloth  6 each Topical Q0600  . enoxaparin (LOVENOX) injection  30 mg Subcutaneous Q24H  . feeding supplement  1 Container Oral BID BM  . feeding supplement (PRO-STAT SUGAR FREE 64)  30 mL Oral BID  . insulin aspart  0-9 Units Subcutaneous TID WC  . loratadine  10 mg Oral Daily  . multivitamin  1 tablet Oral QHS  . mupirocin ointment  1 application Nasal BID  . senna-docusate  2 tablet Oral BID  . sevelamer carbonate  1,600 mg Oral TID WC   Continuous Infusions:   LOS: 1 day    Time spent: 30 minutes.     Hosie Poisson, MD [facility] Pager [phone]  If 7PM-7AM, please contact night-coverage [email or web address] Password [email or web address] 12/10/2016, 3:17 PM

## 2016-12-10 NOTE — Progress Notes (Signed)
On call NP, Schorr, ordered for patient to be bladder scanned once more. Patient was found to be retaining 552 mL. RN notified on call of result. Urologist, Borden, at bedside to attempt insertion of Coude catheter. Attempt successful. 18 Fr coude catheter placed.  Return of >500 of pink tinged urine. Patient minimally bleeding from urethra and patient states relief. RN will continue to monitor patient.  Ermalinda Memos, RN

## 2016-12-10 NOTE — Progress Notes (Signed)
Patient is constipated; MD aware, new orders placed, and medications have been administered for constipation. At this time, patient is refusing hemodialysis due to being uncomfortable from constipation. MD is aware. Educated patient on the importance of dialysis treatment today to resolve fluid overload. Patient states that he will try to go later this afternoon. Hemodialysis team aware. MD aware.

## 2016-12-10 NOTE — Progress Notes (Addendum)
Initial Nutrition Assessment  DOCUMENTATION CODES:   Not applicable  INTERVENTION:  Provide Boost Breeze po BID, each supplement provides 250 kcal and 9 grams of protein.  Provide 30 ml Prostat po BID, each supplement provides 100 kcal and 15 grams of protein.   Encourage adequate PO intake.   NUTRITION DIAGNOSIS:   Increased nutrient needs related to chronic illness as evidenced by estimated needs.  GOAL:   Patient will meet greater than or equal to 90% of their needs  MONITOR:   PO intake, Supplement acceptance, Labs, Weight trends, Skin, I & O's  REASON FOR ASSESSMENT:   Malnutrition Screening Tool    ASSESSMENT:   71 y.o. male with ESRD, remote Hx bladder cancer, Type 2 DM, HFrEF (EF ~40%), non-obstructive CAD, and Hx subcapsular kidney bleed associated with atypical cyst (watched by urology) who was admitted with dyspnea/volume overload.  Has been dyspneic off and on x 2 weeks, also with worsened LE edema. Missed last HD due to URI symptoms. On admit, he was given IV lasix for volume symptoms. Afterwards, he developed lower abdominal pressure and found to be retaining urine. Foley was placed which has quite a bit of hematuria present.  Pt was unavailable working with therapy during time of visit. RD unable to obtain nutrition history. No recent meal completion recorded. Recent weight loss per weight records likely related to fluid status. Plans for HD today. RD to order nutritional suppleements to aid in caloric and protein needs. Noted pt unable to tolerate milk products.   Unable to complete Nutrition-Focused physical exam at this time. RD to perform physical exam at next visit.   Labs and medications reviewed.   Diet Order:  Diet Carb Modified Fluid consistency: Thin; Room service appropriate? Yes  Skin:  Reviewed, no issues  Last BM:  2/25  Height:   Ht Readings from Last 1 Encounters:  12/03/16 5\' 11"  (1.803 m)    Weight:   Wt Readings from Last 1  Encounters:  12/09/16 211 lb 14.4 oz (96.1 kg)    Ideal Body Weight:  78 kg  BMI:  Body mass index is 29.55 kg/m.  Estimated Nutritional Needs:   Kcal:  2100-2300  Protein:  110-120 grams  Fluid:  Per MD  EDUCATION NEEDS:   No education needs identified at this time  Corrin Parker, MS, RD, LDN Pager # [phone] After hours/ weekend pager # [phone]

## 2016-12-10 NOTE — Progress Notes (Signed)
Patient given IV lasix in ED prior to arrival on the unit. Patient having urgency but not able to urinate. Bladder scanned and found to be retaining 467 and started feeling pressure in lower abdomen. RN attempted to In and out cath patient and met resistance with minimal amount of blood at the tip of the catheter. RN notified on call NP, Schorr, and placed order insertion for a Coude catheter. RN notified charge RN on rehab unit. Rehab RN attempted to place coude catheter x2 and met resistance as well. On call NP Schorr, notified. Awaiting order. Patient continues to try to urinate but unsuccessful.   Ermalinda Memos, RN

## 2016-12-10 NOTE — Progress Notes (Signed)
Urology came to see patient at approximately 1700 and replaced catheter with 3 way irrigation catheter. Catheter is currently irrigating and patient is passing clots as expected.

## 2016-12-10 NOTE — Progress Notes (Signed)
New Admission Note:  Arrival Method: Wheelchair from [facility] ED Mental Orientation: A&O x4 Telemetry: Box 24, CCMD notified Assessment: Completed Skin: Assessed with Emman, RN, check flowsheets IV: R AC, saline locked Safety Measures: Safety Fall Prevention Plan discussed with patient Admission: Completed 6 [facility] Orientation: Patient has been orientated to the room, unit and the staff. Family: Wife at bedside.  Orders have been reviewed and implemented. Will continue to monitor the patient. Call light has been placed within reach.  Nena Polio BSN, RN  Phone Number: [phone]

## 2016-12-10 NOTE — Progress Notes (Signed)
Patient continues to complain of pressure and discomfort in the pelvic area. Continually to request to sit on the toilet in the bathroom due to the pressure/discomfort. Noted to continue to have hematuria present in foley bag also noted around urethera.  Noted to have 300 cc bloody urine, clots so far today. Helle, Rapid Response RN up to assess patient. Also paged Dr. [name] [phone]) with Urology. Sanjana Folz, Bryn Gulling

## 2016-12-10 NOTE — Consult Note (Signed)
[facility] Renal Consultation Note    Indication for Consultation:  Management of ESRD/hemodialysis, anemia, hypertension/volume, and secondary hyperparathyroidism. PCP:  HPI: Albert Stanley is a 71 y.o. male with ESRD, remote Hx bladder cancer, Type 2 DM, HFrEF (EF ~40%), non-obstructive CAD, and Hx subcapsular kidney bleed associated with atypical cyst (watched by urology) who was admitted with dyspnea/volume overload.   Has been dyspneic off and on x 2 weeks, also with worsened LE edema. Missed last HD due to URI symptoms. On admit, he was given IV lasix for volume symptoms. Afterwards, he developed lower abdominal pressure and found to be retaining urine. Foley was placed which has quite a bit of hematuria present.  Currently, he has the foley in place but he is having significant pressure sensation in the pelvic area, feels like he need to have a bowel movement. Denies fever, chills, chest pain at this time. He is due for dialysis today.  Pt uncomfortable- keeps having urge to have BM- refused an IV because he is in so much discomfort- urology is supposed to be here later- they may need to change out cath or do bladder irrigation- this is clearly the most pressing issue at this time.  On room air sats of 100%  Past Medical History:  Diagnosis Date  . Asthma   . Bladder cancer ([place]) dx'd 1990   surg only  . Diabetes mellitus without complication ([place])   . ESRD (end stage renal disease) ([name])    Tues, Thurs, Sat dialysis  . History of cardiac catheterization    a. LHC 5/17: no obstructive CAD  . History of DVT (deep vein thrombosis)   . History of nuclear stress test    a. Myoview 4/17: EF 39%, inf, inf-lat, apical inf, apical lat, apical scar, intermediate risk  . Hyperlipidemia   . Hypertension   . NICM (nonischemic cardiomyopathy) ([place])    a. Echo 2/17: mod LVH, EF 35-40%, inf-lat and inf-sept HK, mod MR, severe LAE, small pericardial effusion   Past Surgical  History:  Procedure Laterality Date  . arthroscopic knee surgery    . AV FISTULA PLACEMENT Left 12/13/2015   Procedure: ARTERIOVENOUS (AV) FISTULA CREATION;  Surgeon: Angelia Mould, MD;  Location: [place];  Service: Vascular;  Laterality: Left;  . BACK SURGERY     2 times  . CARDIAC CATHETERIZATION N/A 02/28/2016   Procedure: Left Heart Cath and Coronary Angiography;  Surgeon: Larey Dresser, MD;  Location: [place] CV [facility];  Service: Cardiovascular;  Laterality: N/A;  . CYSTOSCOPY    . FISTULA SUPERFICIALIZATION Left 08/14/2016   Procedure: FISTULA SUPERFICIALIZATION LEFT UPPER ARM;  Surgeon: Angelia Mould, MD;  Location: [place];  Service: Vascular;  Laterality: Left;  . INSERTION OF DIALYSIS CATHETER N/A 12/13/2015   Procedure: INSERTION OF DIALYSIS CATHETER;  Surgeon: Angelia Mould, MD;  Location: [place];  Service: Vascular;  Laterality: N/A;  . JOINT REPLACEMENT     hip  . LIGATION OF COMPETING BRANCHES OF ARTERIOVENOUS FISTULA Left 08/14/2016   Procedure: LIGATION OF COMPETING BRANCHES OF ARTERIOVENOUS FISTULA LEFT UPPER ARM;  Surgeon: Angelia Mould, MD;  Location: [place];  Service: Vascular;  Laterality: Left;  . PERIPHERAL VASCULAR CATHETERIZATION Left 06/09/2016   Procedure: Fistulagram;  Surgeon: Serafina Mitchell, MD;  Location: [place] CV [facility];  Service: Cardiovascular;  Laterality: Left;  ARM  . PERIPHERAL VASCULAR CATHETERIZATION Left 06/09/2016   Procedure: Peripheral Vascular Balloon Angioplasty;  Surgeon: Serafina Mitchell,  MD;  Location: [place] CV LAB;  Service: Cardiovascular;  Laterality: Left;  upper arm venous  . PERIPHERAL VASCULAR CATHETERIZATION Left 06/25/2016   Procedure: Fistulagram;  Surgeon: Serafina Mitchell, MD;  Location: [place] CV LAB;  Service: Cardiovascular;  Laterality: Left;   Family History  Problem Relation Age of Onset  . Arthritis Mother   . Cancer Father   . Heart disease Father   . Hypertension Sister   . Alcohol  abuse Brother   . Diabetes Daughter   . Heart attack Paternal Uncle    Social History:  reports that he has quit smoking. He has never used smokeless tobacco. He reports that he does not drink alcohol or use drugs.  ROS: As per HPI otherwise negative.  Physical Exam: Vitals:   12/09/16 1842 12/09/16 2130 12/10/16 0554 12/10/16 0940  BP: 146/90 (!) 152/110 (!) 154/82 (!) 145/90  Pulse: (!) 42 (!) 59 88 62  Resp: 13 16 16 16   Temp:  97.3 F (36.3 C) 98.4 F (36.9 C) 97.6 F (36.4 C)  TempSrc:  Oral Oral Oral  SpO2: 99% 100% 100% 100%  Weight:  96.1 kg (211 lb 14.4 oz)     General: Well developed, well nourished, in no acute distress. Head: Normocephalic, atraumatic, sclera non-icteric, mucus membranes are moist.. Lower extremities: 3+ LE pitting edema Neuro: Alert and oriented X 3. Moves all extremities spontaneously. Psych:  Responds to questions appropriately with a normal affect. Dialysis Access: LUE AVF  Allergies  Allergen Reactions  . Meperidine Shortness Of Breath and Other (See Comments)    Increased heart rate Reaction to demerol  . Sulfa Antibiotics Shortness Of Breath and Other (See Comments)    Increased heart rate  . Sulfonamide Derivatives Shortness Of Breath and Other (See Comments)    Increased heart rate  . Eggs Or Egg-Derived Products Nausea And Vomiting     Reaction to egg yolks - not whites    . Foltx [Elfolate Plus] Other (See Comments)    Pt does not recall reaction to this  . Lac Bovis Diarrhea, Nausea And Vomiting and Other (See Comments)    Constipation Reaction to whole milk  . Lactose Intolerance (Gi) Diarrhea, Nausea And Vomiting and Other (See Comments)    Constipation Reaction to whole milk  . Milk-Related Compounds Diarrhea, Nausea And Vomiting and Other (See Comments)    Constipation Reaction to whole milk  . Penicillins     "Knocks me out."   Has patient had a PCN reaction causing immediate rash, facial/tongue/throat swelling,  SOB or lightheadedness with hypotension: yes- blacked out Has patient had a PCN reaction causing severe rash involving mucus membranes or skin necrosis: unknown Has patient had a PCN reaction that required hospitalization: at MD office Has patient had a PCN reaction occurring within the last 10 years: no If all of the above answers are "NO", then may proceed with Cephalosporin use.   . Betadine [Povidone Iodine] Itching, Rash and Other (See Comments)    burning   Prior to Admission medications   Medication Sig Start Date End Date Taking? Authorizing Provider  albuterol (PROVENTIL HFA;VENTOLIN HFA) 108 (90 Base) MCG/ACT inhaler Inhale 1-2 puffs into the lungs every 6 (six) hours as needed for wheezing or shortness of breath. 12/03/16  Yes Kinnie Feil, PA-C  allopurinol (ZYLOPRIM) 300 MG tablet Take 300 mg by mouth daily.    Yes Historical Provider, MD  atorvastatin (LIPITOR) 40 MG tablet Take 40 mg by mouth  at bedtime.  01/21/16  Yes Historical Provider, MD  BAYER BREEZE 2 TEST DISK Apply 1 each topically daily as needed (FOR TESTING BLOOD SUGAR).  11/13/15  Yes Historical Provider, MD  benzonatate (TESSALON PERLES) 100 MG capsule Take 1 capsule (100 mg total) by mouth 3 (three) times daily as needed for cough. 12/03/16  Yes Kinnie Feil, PA-C  calcitRIOL (ROCALTROL) 0.5 MCG capsule Take 0.5 mcg by mouth Every Tuesday,Thursday,and Saturday with dialysis. Medication given at dialysis on Tues Thurs Sat 11/13/15  Yes Historical Provider, MD  carvedilol (COREG) 3.125 MG tablet Take 1 tablet (3.125 mg total) by mouth See admin instructions. Take 1 tablet (3.125 mg) by mouth twice daily on Monday, Wednesday, Friday and Sunday (non-dialysis days) 08/14/16  Yes Alvia Grove, PA-C  cyclobenzaprine (FLEXERIL) 5 MG tablet Take 5 mg by mouth 2 (two) times daily as needed for muscle spasms.    Yes Historical Provider, MD  famotidine (PEPCID) 20 MG tablet Take 1 tablet (20 mg total) by mouth 2 (two) times  daily as needed (sour stomach). 08/14/16  Yes Kimberly A Trinh, PA-C  fluticasone (FLONASE) 50 MCG/ACT nasal spray Place 2 sprays into both nostrils daily as needed for allergies or rhinitis.    Yes Historical Provider, MD  glipiZIDE (GLUCOTROL) 5 MG tablet Take 0.5 tablets (2.5 mg total) by mouth 2 (two) times daily. 08/14/16  Yes Kimberly A Trinh, PA-C  Loratadine-Pseudoephedrine (CLARITIN-D 24 HOUR PO) Take 1 tablet by mouth daily. PATIENT NOT SURE OF THE DOSAGE   Yes Historical Provider, MD  multivitamin (RENA-VIT) TABS tablet Take 1 tablet by mouth at bedtime.  12/20/15  Yes Historical Provider, MD  oseltamivir (TAMIFLU) 30 MG capsule Take 1 capsule (30 mg total) by mouth daily. Take 30 mg after every hemodialysis session for 5 days total 12/03/16  Yes Kinnie Feil, PA-C  oxyCODONE-acetaminophen (PERCOCET) 7.5-325 MG tablet Take 1 tablet by mouth 3 (three) times daily as needed (pain). 08/14/16  Yes Alvia Grove, PA-C  sevelamer carbonate (RENVELA) 800 MG tablet Take 2 tablets (1,600 mg total) by mouth 3 (three) times daily with meals. 12/18/15  Yes Geradine Girt, DO   Current Facility-Administered Medications  Medication Dose Route Frequency Provider Last Rate Last Dose  . 0.9 %  sodium chloride infusion  100 mL Intravenous PRN Loren Racer, PA-C      . 0.9 %  sodium chloride infusion  100 mL Intravenous PRN Loren Racer, PA-C      . acetaminophen (TYLENOL) tablet 650 mg  650 mg Oral Q6H PRN Theodis Blaze, MD       Or  . acetaminophen (TYLENOL) suppository 650 mg  650 mg Rectal Q6H PRN Theodis Blaze, MD      . albuterol (PROVENTIL) (2.5 MG/3ML) 0.083% nebulizer solution 2.5 mg  2.5 mg Inhalation Q6H PRN Theodis Blaze, MD      . allopurinol (ZYLOPRIM) tablet 300 mg  300 mg Oral Daily Theodis Blaze, MD   300 mg at 12/10/16 0912  . alteplase (CATHFLO ACTIVASE) injection 2 mg  2 mg Intracatheter Once PRN Loren Racer, PA-C      . atorvastatin (LIPITOR) tablet 40 mg  40 mg Oral  QHS Theodis Blaze, MD   40 mg at 12/09/16 2150  . benzonatate (TESSALON) capsule 100 mg  100 mg Oral TID PRN Theodis Blaze, MD      . bisacodyl (DULCOLAX) suppository 10 mg  10 mg Rectal  Daily PRN Hosie Poisson, MD   10 mg at 12/10/16 0912  . calcitRIOL (ROCALTROL) capsule 0.5 mcg  0.5 mcg Oral Q T,Th,Sa-HD Theodis Blaze, MD      . Derrill Memo ON 12/11/2016] carvedilol (COREG) tablet 3.125 mg  3.125 mg Oral 2 times per day on Sun Mon Wed Fri Theodis Blaze, MD      . Chlorhexidine Gluconate Cloth 2 % PADS 6 each  6 each Topical Q0600 Theodis Blaze, MD   6 each at 12/10/16 0600  . cyclobenzaprine (FLEXERIL) tablet 5 mg  5 mg Oral BID PRN Theodis Blaze, MD      . enoxaparin (LOVENOX) injection 30 mg  30 mg Subcutaneous Q24H Theodis Blaze, MD   30 mg at 12/09/16 2151  . famotidine (PEPCID) tablet 20 mg  20 mg Oral BID PRN Theodis Blaze, MD      . fluticasone (FLONASE) 50 MCG/ACT nasal spray 2 spray  2 spray Each Nare Daily PRN Theodis Blaze, MD      . guaiFENesin-dextromethorphan [facility] DM) 100-10 MG/5ML syrup 5 mL  5 mL Oral Q4H PRN Theodis Blaze, MD   5 mL at 12/09/16 2151  . heparin injection 1,000 Units  1,000 Units Dialysis PRN Loren Racer, PA-C      . heparin injection 1,900 Units  20 Units/kg Dialysis PRN Loren Racer, PA-C      . insulin aspart (novoLOG) injection 0-9 Units  0-9 Units Subcutaneous TID WC Theodis Blaze, MD      . lidocaine (PF) (XYLOCAINE) 1 % injection 5 mL  5 mL Intradermal PRN Loren Racer, PA-C      . lidocaine-prilocaine (EMLA) cream 1 application  1 application Topical PRN Loren Racer, PA-C      . loratadine (CLARITIN) tablet 10 mg  10 mg Oral Daily Theodis Blaze, MD   10 mg at 12/10/16 [id number]  . multivitamin (RENA-VIT) tablet 1 tablet  1 tablet Oral QHS Theodis Blaze, MD   1 tablet at 12/09/16 2151  . mupirocin ointment (BACTROBAN) 2 % 1 application  1 application Nasal BID Theodis Blaze, MD   1 application at [id number]  . ondansetron (ZOFRAN) tablet  4 mg  4 mg Oral Q6H PRN Theodis Blaze, MD       Or  . ondansetron [facility]) injection 4 mg  4 mg Intravenous Q6H PRN Theodis Blaze, MD      . oxyCODONE-acetaminophen (PERCOCET) 7.5-325 MG per tablet 1 tablet  1 tablet Oral TID PRN Theodis Blaze, MD   1 tablet at 12/09/16 2155  . pentafluoroprop-tetrafluoroeth (GEBAUERS) aerosol 1 application  1 application Topical PRN Loren Racer, PA-C      . senna-docusate (Senokot-S) tablet 2 tablet  2 tablet Oral BID Hosie Poisson, MD   2 tablet at 12/10/16 0912  . sevelamer carbonate (RENVELA) tablet 1,600 mg  1,600 mg Oral TID WC Theodis Blaze, MD       Labs: Basic Metabolic Panel:  Recent Labs Lab 12/03/16 1450 12/09/16 1201 12/09/16 2145 12/10/16 0515  NA 139 136  --  139  K 4.0 3.9  --  4.2  CL 99* 96*  --  101  CO2 26 21*  --  19*  GLUCOSE 99 114*  --  155*  BUN 51* 73*  --  80*  CREATININE 6.77* 7.83* 8.02* 8.25*  CALCIUM 9.0 8.2*  --  8.6*  Liver Function Tests:  Recent Labs Lab 12/03/16 1450  AST 27  ALT 17  ALKPHOS 55  BILITOT 0.7  PROT 6.6  ALBUMIN 3.3*   CBC:  Recent Labs Lab 12/03/16 1450 12/09/16 1201 12/09/16 2145 12/10/16 0515  WBC 3.8* 5.0 4.8 8.1  NEUTROABS 2.6  --   --   --   HGB 10.6* 11.7* 13.3 12.3*  HCT 33.4* 35.5* 36.0* 36.9*  MCV 92.0 88.5 89.1 88.9  PLT 151 146* 138* 187   CBG:  Recent Labs Lab 12/09/16 2132 12/10/16 0744 12/10/16 1214  GLUCAP 118* 174* 183*   Studies/Results: Dg Chest 2 View  Result Date: 12/09/2016 CLINICAL DATA:  Lower extremity swelling for 1 month. Shortness of breath. EXAM: CHEST  2 VIEW COMPARISON:  12/27/2015 FINDINGS: Dialysis catheter tip:  SVC. Tortuous thoracic aorta. Mild enlargement of the cardiopericardial silhouette. Stable scarring at the right lung base. Mild thoracic spondylosis. Upper zone pulmonary vascular prominence. IMPRESSION: 1. Mild enlargement of the cardiopericardial silhouette with prominent upper zone pulmonary vasculature suggesting  cephalization of blood flow compatible with pulmonary venous hypertension. 2. Stable scarring at the right lung base. 3. Tortuous thoracic aorta. Electronically Signed   By: Van Clines M.D.   On: 12/09/2016 12:53    Dialysis Orders:  TTS at [facility] 4:15 hours, 400/800, EDW 95kg, 2K/2.25Ca, AVF - Heparin 7000 bolus - Mircera 116mcg q 2 weeks (last 2/15) - Calcitriol PO 2.23mcg q HD  Assessment/Plan: 1.  Dyspnea/volume overload: Will try to remove extra fluid with HD today, which should help symptoms. 2.  ESRD: Continue HD TTS for now, dialyzing today 3/1. No heparin due to bleeding with foley. 3.  Hypertension/volume: See above, lowering EDW as tolerated. 4.  Anemia: Hgb 12.3, holding ESA for now. 5.  Metabolic bone disease: Ca ok, Phos pending. Continue binders/VDRA (lower dose here is fine). 6.  Type 2 DM: Per primary.  Veneta Penton, PA-C 12/10/2016, 1:05 PM  [facility] Pager: [phone]  Patient seen and examined, agree with above note with above modifications. ESRD pt who missed HD on Tuesday- came in c/o dyspnea but then given lasix , discovered to have BOO had traumatic foley placement and now with bladder spasms.  We were going to do HD today but is clear that with pain, repeatedly getting up to use commode and Urology pending arrival it will be difficult to get him through a 4 hour HD treatment.  As he is not in any distress will put HD off til tomorrow  Corliss Parish, MD 12/10/2016

## 2016-12-10 NOTE — Progress Notes (Addendum)
Received return call from Dr. [name] made aware of patient discomfort and hematuria with clots. Dr. [name] to come see patient today. Will be around 4pm. Requested bladder scan. Patient and wife informed.Patients wife concerned about the bleeding and discomfort.  Analicia Skibinski, Bryn Gulling

## 2016-12-11 ENCOUNTER — Inpatient Hospital Stay (HOSPITAL_COMMUNITY): Payer: Medicare Other

## 2016-12-11 DIAGNOSIS — E785 Hyperlipidemia, unspecified: Secondary | ICD-10-CM

## 2016-12-11 DIAGNOSIS — E1169 Type 2 diabetes mellitus with other specified complication: Secondary | ICD-10-CM

## 2016-12-11 DIAGNOSIS — I509 Heart failure, unspecified: Secondary | ICD-10-CM

## 2016-12-11 LAB — BASIC METABOLIC PANEL
ANION GAP: 17 — AB (ref 5–15)
BUN: 92 mg/dL — ABNORMAL HIGH (ref 6–20)
CALCIUM: 8.7 mg/dL — AB (ref 8.9–10.3)
CO2: 21 mmol/L — ABNORMAL LOW (ref 22–32)
Chloride: 102 mmol/L (ref 101–111)
Creatinine, Ser: 9.07 mg/dL — ABNORMAL HIGH (ref 0.61–1.24)
GFR, EST AFRICAN AMERICAN: 6 mL/min — AB (ref >60)
GFR, EST NON AFRICAN AMERICAN: 5 mL/min — AB (ref >60)
GLUCOSE: 135 mg/dL — AB (ref 65–99)
POTASSIUM: 4.7 mmol/L (ref 3.5–5.1)
SODIUM: 140 mmol/L (ref 135–145)

## 2016-12-11 LAB — CBC
HEMATOCRIT: 29.7 % — AB (ref 39.0–52.0)
HEMOGLOBIN: 10 g/dL — AB (ref 13.0–17.0)
MCH: 29.5 pg (ref 26.0–34.0)
MCHC: 33.7 g/dL (ref 30.0–36.0)
MCV: 87.6 fL (ref 78.0–100.0)
Platelets: 171 10*3/uL (ref 150–400)
RBC: 3.39 MIL/uL — AB (ref 4.22–5.81)
RDW: 15.7 % — ABNORMAL HIGH (ref 11.5–15.5)
WBC: 9.6 10*3/uL (ref 4.0–10.5)

## 2016-12-11 LAB — GLUCOSE, CAPILLARY
GLUCOSE-CAPILLARY: 159 mg/dL — AB (ref 65–99)
Glucose-Capillary: 131 mg/dL — ABNORMAL HIGH (ref 65–99)
Glucose-Capillary: 127 mg/dL — ABNORMAL HIGH (ref 65–99)

## 2016-12-11 LAB — ECHOCARDIOGRAM COMPLETE
Height: 71 in
WEIGHTICAEL: 3322.77 [oz_av]

## 2016-12-11 LAB — HEPATITIS B SURFACE ANTIGEN: Hepatitis B Surface Ag: NEGATIVE

## 2016-12-11 LAB — HEPATITIS B SURFACE ANTIBODY,QUALITATIVE: Hep B S Ab: REACTIVE

## 2016-12-11 LAB — HEMOGLOBIN A1C
Hgb A1c MFr Bld: 6.4 % — ABNORMAL HIGH (ref 4.8–5.6)
MEAN PLASMA GLUCOSE: 137 mg/dL

## 2016-12-11 LAB — HEPATITIS B CORE ANTIBODY, TOTAL: Hep B Core Total Ab: NEGATIVE

## 2016-12-11 MED ORDER — DARBEPOETIN ALFA 150 MCG/0.3ML IJ SOSY
150.0000 ug | PREFILLED_SYRINGE | INTRAMUSCULAR | Status: DC
  Administered 2016-12-11: 150 ug via INTRAVENOUS
  Filled 2016-12-11: qty 0.3

## 2016-12-11 MED ORDER — DARBEPOETIN ALFA 150 MCG/0.3ML IJ SOSY
PREFILLED_SYRINGE | INTRAMUSCULAR | Status: AC
  Administered 2016-12-11: 150 ug via INTRAVENOUS
  Filled 2016-12-11: qty 0.3

## 2016-12-11 NOTE — Evaluation (Signed)
Physical Therapy Evaluation Patient Details Name: Albert Stanley MRN: [id number] DOB: 1946-01-01 Today's Date: 12/11/2016   History of Present Illness  Albert Stanley is a 71 y.o. male with known ESRD on HD TTS, DM type II with complications of nephropathy, HTN, admitted with worsening dyspnea, progressive LE swelling and poor oral intake. He missed Tuesday HD prior to admission.  He also developed bladder outlet obstruction with traumatic foley placement and now with continuous irrigation   Clinical Impression  Patient presents at min A level for ambulation and with limited activity tolerance since admission.  Previously was independent with ambulation (with limp on L) and performing homemaking tasks and driving himself to dialysis.  Feel he will benefit from skilled PT in the acute setting to maximize mobility prior to d/c home with family support and follow up HHPT.    Follow Up Recommendations Home health PT    Equipment Recommendations  None recommended by PT    Recommendations for Other Services       Precautions / Restrictions Precautions Precautions: Fall Restrictions Weight Bearing Restrictions: No      Mobility  Bed Mobility Overal bed mobility: Modified Independent                Transfers Overall transfer level: Needs assistance   Transfers: Sit to/from Stand Sit to Stand: Supervision         General transfer comment: for safety with cues due to pt standing prior to having lines prepared to move safely  Ambulation/Gait Ambulation/Gait assistance: Min assist;Min guard Ambulation Distance (Feet): 150 Feet Assistive device:  (reaching for counter tops, furniture in the room and using wall rail on L in hallway, declined to use walker) Gait Pattern/deviations: Step-through pattern;Trendelenburg;Decreased stride length;Wide base of support;Decreased stance time - left     General Gait Details: some degree of trendelenberg on L with creiptus in knee vs  hip and pt reaching for railing, etc, declines to use walking device  Stairs            Wheelchair Mobility    Modified Rankin (Stroke Patients Only)       Balance Overall balance assessment: Needs assistance   Sitting balance-Leahy Scale: Good     Standing balance support: No upper extremity supported Standing balance-Leahy Scale: Fair Standing balance comment: some forward bending when standing without UE support (potentially compensation to lower COG for balance vs due to arthritis in hip/back                             Pertinent Vitals/Pain Pain Assessment: Faces Faces Pain Scale: Hurts little more Pain Location: low pelvic discomfort, pt feels due to needing to have BM Pain Descriptors / Indicators: Discomfort Pain Intervention(s): Monitored during session    Home Living Family/patient expects to be discharged to:: Private residence Living Arrangements: Spouse/significant other;Children Available Help at Discharge: Family;Available 24 hours/day Type of Home: House Home Access: Level entry     Home Layout: Multi-level Home Equipment: Cane - single point      Prior Function Level of Independence: Independent with assistive device(s)         Comments: was driving self to dialysis. Was perfomring some activities at home including cooking and washing dishes.      Hand Dominance        Extremity/Trunk Assessment   Upper Extremity Assessment Upper Extremity Assessment: Overall WFL for tasks assessed    Lower Extremity Assessment Lower  Extremity Assessment: LLE deficits/detail LLE Deficits / Details: noted crepitus in knee with ambulation and pt relates has had hip replacement on that side, but knee needs to be replaces also       Communication      Cognition Arousal/Alertness: Awake/alert Behavior During Therapy: WFL for tasks assessed/performed Overall Cognitive Status: Within Functional Limits for tasks assessed                  General Comments: arguing some with wife about equipment available at home and level of independnce he had at home    General Comments      Exercises     Assessment/Plan    PT Assessment Patient needs continued PT services  PT Problem List Decreased strength;Decreased mobility;Decreased balance;Decreased knowledge of use of DME;Decreased safety awareness;Decreased activity tolerance       PT Treatment Interventions DME instruction;Gait training;Balance training;Stair training;Functional mobility training;Therapeutic activities;Patient/family education    PT Goals (Current goals can be found in the Care Plan section)  Acute Rehab PT Goals Patient Stated Goal: To go home PT Goal Formulation: With patient Time For Goal Achievement: 12/18/16 Potential to Achieve Goals: Good    Frequency Min 3X/week   Barriers to discharge        Co-evaluation               End of Session Equipment Utilized During Treatment: Gait belt Activity Tolerance: Patient tolerated treatment well Patient left: in bed;with call bell/phone within reach;Other (comment) (Echo technologist in room)   PT Visit Diagnosis: Other abnormalities of gait and mobility (R26.89);Muscle weakness (generalized) (M62.81)         Time: [id number] PT Time Calculation (min) (ACUTE ONLY): 26 min   Charges:   PT Evaluation $PT Eval Moderate Complexity: 1 Procedure PT Treatments $Gait Training: 8-22 mins   PT G Codes:         Reginia Naas Jan 01, 2017, 10:52 AM Magda Kiel, [place] 01-01-17

## 2016-12-11 NOTE — Care Management Note (Addendum)
Case Management Note   Patient Details  Name: KARIN PISANO MRN: [id number] Date of Birth: 1946/01/23  Subjective/Objective:  From home with wife,transferred to SDU  has ESRD on HD TTS, presents with worsening dyspnea, progressive LE swelling and poor oral intake. Missed  Tuesday HD, developed bladder outlet obstruction with traumatic foley placement with gross hematuria , now with continuous 3 way  bladder irrigation.  He lives with wife, pta indep, he has a walker and a cane at home.  He has pcp, Dr Delfina Redwood, he will have transportation at discharge, he has medication coverage.  Per pt eval today, no pt f/u needed.  NCM will cont to follow for dc needs.                    Action/Plan:   Expected Discharge Date:                  Expected Discharge Plan:  [place]  In-House Referral:     Discharge planning Services  CM Consult  Post Acute Care Choice:    Choice offered to:     DME Arranged:    DME Agency:     HH Arranged:    [place] Agency:     Status of Service:  In process, will continue to follow  If discussed at Long Length of Stay Meetings, dates discussed:    Additional Comments:  Zenon Mayo, RN 12/11/2016, 12:45 PM

## 2016-12-11 NOTE — Progress Notes (Signed)
Subjective:  Moved to 4 E- got 3 way foley placed- getting bladder irrigation- feels better- but still particular about his HD Objective Vital signs in last 24 hours: Vitals:   12/10/16 2206 12/10/16 2310 12/11/16 0324 12/11/16 0751  BP: (!) 159/81 (!) 143/81 (!) 151/92 (!) 131/93  Pulse: (!) 108     Resp: 11 13 20 17   Temp: 98 F (36.7 C) 98 F (36.7 C) 97.9 F (36.6 C) 98 F (36.7 C)  TempSrc: Oral Oral Oral Oral  SpO2: 95% 98% 100% 100%  Weight:   94.2 kg (207 lb 10.8 oz)   Height:   5\' 11"  (1.803 m)    Weight change: -1.917 kg (-4 lb 3.6 oz)  Intake/Output Summary (Last 24 hours) at 12/11/16 0900 Last data filed at 12/11/16 0840  Gross per 24 hour  Intake            24060 ml  Output            38625 ml  Net           -14565 ml   Dialysis Orders:  TTS at [facility] 4:15 hours, 400/800, EDW 95kg, 2K/2.25Ca, AVF - Heparin 7000 bolus - Mircera 174mcg q 2 weeks (last 2/15) - Calcitriol PO 2.41mcg q HD  Assessment/Plan: 1.  Dyspnea/volume overload: Will try to remove extra fluid with HD today, also running 2 days in a row which should help symptoms. 2.  ESRD: Normally TTS- Missed Tuesday 2/27- could not do 3/1 due to his bladder issues. No heparin due to bleeding with foley. Will need to do today and tomorrow to get back on schedule  3.  Hypertension/volume: See above, lowering EDW as tolerated. 4.  Anemia: Hgb 12.3---10 likely due to hematuria , resume ESA  5.  Metabolic bone disease: Ca ok, Phos pending. Continue binders- renvela/VDRA (calcitriol) 6.  Type 2 DM: Per primary 7. Bladder issues. Now with 3 way foley and bladder irrigation.  Per urology - pt feels much better - not sure if anything definitive needs to be done this [place]: Basic Metabolic Panel:  Recent Labs Lab 12/09/16 1201 12/09/16 2145 12/10/16 0515 12/11/16 0222  NA 136  --  139 140  K 3.9  --  4.2 4.7  CL 96*  --  101 102  CO2 21*  --  19* 21*  GLUCOSE 114*  --   155* 135*  BUN 73*  --  80* 92*  CREATININE 7.83* 8.02* 8.25* 9.07*  CALCIUM 8.2*  --  8.6* 8.7*   Liver Function Tests: No results for input(s): AST, ALT, ALKPHOS, BILITOT, PROT, ALBUMIN in the last 168 hours. No results for input(s): LIPASE, AMYLASE in the last 168 hours. No results for input(s): AMMONIA in the last 168 hours. CBC:  Recent Labs Lab 12/09/16 1201 12/09/16 2145 12/10/16 0515 12/11/16 0222  WBC 5.0 4.8 8.1 9.6  HGB 11.7* 13.3 12.3* 10.0*  HCT 35.5* 36.0* 36.9* 29.7*  MCV 88.5 89.1 88.9 87.6  PLT 146* 138* 187 171   Cardiac Enzymes: No results for input(s): CKTOTAL, CKMB, CKMBINDEX, TROPONINI in the last 168 hours. CBG:  Recent Labs Lab 12/09/16 2132 12/10/16 0744 12/10/16 1214 12/10/16 1856 12/10/16 2110  GLUCAP 118* 174* 183* 173* 158*    Iron Studies: No results for input(s): IRON, TIBC, TRANSFERRIN, FERRITIN in the last 72 hours. Studies/Results: Dg Chest 2 View  Result Date: 12/09/2016 CLINICAL DATA:  Lower extremity swelling  for 1 month. Shortness of breath. EXAM: CHEST  2 VIEW COMPARISON:  12/27/2015 FINDINGS: Dialysis catheter tip:  SVC. Tortuous thoracic aorta. Mild enlargement of the cardiopericardial silhouette. Stable scarring at the right lung base. Mild thoracic spondylosis. Upper zone pulmonary vascular prominence. IMPRESSION: 1. Mild enlargement of the cardiopericardial silhouette with prominent upper zone pulmonary vasculature suggesting cephalization of blood flow compatible with pulmonary venous hypertension. 2. Stable scarring at the right lung base. 3. Tortuous thoracic aorta. Electronically Signed   By: Van Clines M.D.   On: 12/09/2016 12:53   Dg Abd 2 Views  Result Date: 12/10/2016 CLINICAL DATA:  Abdominal pain for 2 days EXAM: ABDOMEN - 2 VIEW COMPARISON:  None. FINDINGS: Scattered large and small bowel gas is noted. Fecal material is noted throughout the colon without obstructive change. No abnormal mass or abnormal  calcifications are seen. No free air is noted. Degenerative changes of the lumbar spine are seen. Left hip replacement is noted. IMPRESSION: Mild constipation although no obstructive changes are seen. Electronically Signed   By: Inez Catalina M.D.   On: 12/10/2016 13:34   Medications: Infusions:   Scheduled Medications: . allopurinol  300 mg Oral Daily  . atorvastatin  40 mg Oral QHS  . [START ON 12/12/2016] calcitRIOL  1 mcg Oral Q T,Th,Sa-HD  . carvedilol  3.125 mg Oral 2 times per day on Sun Mon Wed Fri  . Chlorhexidine Gluconate Cloth  6 each Topical Q0600  . enoxaparin (LOVENOX) injection  30 mg Subcutaneous Q24H  . feeding supplement  1 Container Oral BID BM  . feeding supplement (PRO-STAT SUGAR FREE 64)  30 mL Oral BID  . insulin aspart  0-9 Units Subcutaneous TID WC  . loratadine  10 mg Oral Daily  . multivitamin  1 tablet Oral QHS  . mupirocin ointment  1 application Nasal BID  . senna-docusate  2 tablet Oral BID  . sevelamer carbonate  1,600 mg Oral TID WC    have reviewed scheduled and prn medications.  Physical Exam: General: NAD- many requests- face puffy Heart: RRR Lungs: mostly clear Abdomen: soft, non tender Extremities: minimal edema Dialysis Access: AVF     12/11/2016,9:00 AM  LOS: 2 days

## 2016-12-11 NOTE — Progress Notes (Signed)
  Echocardiogram 2D Echocardiogram has been performed.  Tresa Res 12/11/2016, 10:32 AM

## 2016-12-11 NOTE — Progress Notes (Signed)
PROGRESS NOTE    Albert Stanley  [id number] DOB: [birth year] DOA: 12/09/2016 PCP: [name], MD    Brief Narrative: Albert Stanley is a 71 y.o. male with known ESRD on HD TTS, DM type II with complications of nephropathy, HTN, presented to [facility] ED with main concern of several days duration of progressively worsening dyspnea that initially started with exertion and has progressed to dyspnea at rest. He was found to be fluid overloaded, and was admitted for HD. He was also complaining of urinary retention and had traumatic foley catheter placement on 2/28 and had >500 ml of bloody urine. He continues to have intermittent bladder spasms. Urology consulted.    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  Slight improvement with IV lasix.  He appears comfortable on RA, plan for HD today. Daily weights and strict intake and output.    ESRD ON HD: as per renal.   Diabetes mellitus: with nephropathy.  CBG (last 3)   Recent Labs  12/10/16 1856 12/10/16 2110 12/11/16 1216  GLUCAP 173* 158* 131*    Holding oral meds. Resume SSI.    Thrombocytopenia: monitor platelet count.    Urinary retention with traumatic foley placement with bloody urine , and associated bladder spasms.  Urology consulted , WAS ON bladder irrigation, urine clearing up.  Monitor H&H.   ? Constipation: stool softeners ordered.   DVT prophylaxis: scd's Code Status: (Full) Family Communication: wife at bedside.  Disposition Plan: pending further eval.   Consultants:   Urology     Nephrology.    Procedures: hd.    Antimicrobials: None.   Subjective: Abdominal cramps resolved. Feels like he has to have a BM.  Objective: Vitals:   12/10/16 2310 12/11/16 0324 12/11/16 0751 12/11/16 1215  BP: (!) 143/81 (!) 151/92 (!) 131/93 130/80  Pulse:      Resp: 13 20 17 15   Temp: 98 F (36.7 C) 97.9 F (36.6 C) 98 F (36.7 C) 98 F (36.7 C)  TempSrc: Oral Oral Oral Oral  SpO2: 98% 100% 100% 100%  Weight:  94.2 kg (207 lb 10.8 oz)    Height:  5\' 11"  (1.803 m)      Intake/Output Summary (Last 24 hours) at 12/11/16 1515 Last data filed at 12/11/16 1053  Gross per 24 hour  Intake            27300 ml  Output            41725 ml  Net           -14425 ml   Filed Weights   12/09/16 2130 12/10/16 2026 12/11/16 0324  Weight: 96.1 kg (211 lb 14.4 oz) 94.2 kg (207 lb 10.8 oz) 94.2 kg (207 lb 10.8 oz)    Examination:  General exam: Appears COMFORTABLE.  Respiratory system: Clear to auscultation. Respiratory effort normal. Cardiovascular system: S1 & S2 heard, RRR. No JVD, murmurs, rubs, gallops or clicks. No pedal edema. Gastrointestinal system: lower abd tenderness improved. , Normal bowel sounds heard. Central nervous system: Alert and oriented. No focal  neurological deficits. Extremities: Symmetric 5 x 5 power.     Data Reviewed: I have personally reviewed following labs and imaging studies  CBC:  Recent Labs Lab 12/09/16 1201 12/09/16 2145 12/10/16 0515 12/11/16 0222  WBC 5.0 4.8 8.1 9.6  HGB 11.7* 13.3 12.3* 10.0*  HCT 35.5* 36.0* 36.9* 29.7*  MCV 88.5 89.1 88.9 87.6  PLT 146* 138* 187 [id number]   Basic Metabolic Panel:  Recent Labs Lab 12/09/16 1201 12/09/16 2145 12/10/16 0515 12/11/16 0222  NA 136  --  139 140  K 3.9  --  4.2 4.7  CL 96*  --  101 102  CO2 21*  --  19* 21*  GLUCOSE 114*  --  155* 135*  BUN 73*  --  80* 92*  CREATININE 7.83* 8.02* 8.25* 9.07*  CALCIUM 8.2*  --  8.6* 8.7*   GFR: Estimated Creatinine Clearance: 8.8  mL/min (by C-G formula based on SCr of 9.07 mg/dL (H)). Liver Function Tests: No results for input(s): AST, ALT, ALKPHOS, BILITOT, PROT, ALBUMIN in the last 168 hours. No results for input(s): LIPASE, AMYLASE in the last 168 hours. No results for input(s): AMMONIA in the last 168 hours. Coagulation Profile: No results for input(s): INR, PROTIME in the last 168 hours. Cardiac Enzymes: No results for input(s): CKTOTAL, CKMB, CKMBINDEX, TROPONINI in the last 168 hours. BNP (last 3 results) No results for input(s): PROBNP in the last 8760 hours. HbA1C:  Recent Labs  12/09/16 2145  HGBA1C 6.4*   CBG:  Recent Labs Lab 12/10/16 0744 12/10/16 1214 12/10/16 1856 12/10/16 2110 12/11/16 1216  GLUCAP 174* 183* 173* 158* 131*   Lipid Profile: No results for input(s): CHOL, HDL, LDLCALC, TRIG, CHOLHDL, LDLDIRECT in the last 72 hours. Thyroid Function Tests: No results for input(s): TSH, T4TOTAL, FREET4, T3FREE, THYROIDAB in the last 72 hours. Anemia Panel: No results for input(s): VITAMINB12, FOLATE, FERRITIN, TIBC, IRON, RETICCTPCT in the last 72 hours. Sepsis Labs: No results for input(s): PROCALCITON, LATICACIDVEN in the last 168 hours.  Recent Results (from the past 240 hour(s))  MRSA PCR Screening     Status: Abnormal   Collection Time: 12/09/16  9:39 PM  Result Value Ref Range Status   MRSA by PCR POSITIVE (A) NEGATIVE Final    Comment:        The GeneXpert MRSA Assay (FDA approved for NASAL specimens only), is one component of a comprehensive MRSA colonization surveillance program. It is not intended to diagnose MRSA infection nor to guide or monitor treatment for MRSA infections. RESULT CALLED TO, READ BACK BY AND VERIFIED WITH: E CASTRO,RN @0012  12/10/16 MKELLY,MLT          Radiology Studies: Dg Abd 2 Views  Result Date: 12/10/2016 CLINICAL DATA:  Abdominal pain for 2 days EXAM: ABDOMEN - 2 VIEW COMPARISON:  None. FINDINGS: Scattered large and small bowel  gas is noted. Fecal material is noted throughout the colon without obstructive change. No abnormal mass or abnormal calcifications are seen. No free air is noted. Degenerative changes of the lumbar spine are seen. Left hip replacement is noted. IMPRESSION: Mild constipation although no obstructive changes are seen. Electronically Signed   By: Inez Catalina M.D.   On: 12/10/2016 13:34        Scheduled Meds: . allopurinol  300 mg Oral Daily  . atorvastatin  40 mg Oral QHS  . [START ON 12/12/2016] calcitRIOL  1 mcg Oral Q T,Th,Sa-HD  . carvedilol  3.125 mg Oral 2 times per day  on Sun Mon Wed Fri  . Chlorhexidine Gluconate Cloth  6 each Topical Q0600  . darbepoetin (ARANESP) injection - DIALYSIS  150 mcg Intravenous Q Fri-HD  . enoxaparin (LOVENOX) injection  30 mg Subcutaneous Q24H  . feeding supplement  1 Container Oral BID BM  . feeding supplement (PRO-STAT SUGAR FREE 64)  30 mL Oral BID  . insulin aspart  0-9 Units Subcutaneous TID WC  . loratadine  10 mg Oral Daily  . multivitamin  1 tablet Oral QHS  . mupirocin ointment  1 application Nasal BID  . senna-docusate  2 tablet Oral BID  . sevelamer carbonate  1,600 mg Oral TID WC   Continuous Infusions:   LOS: 2 days    Time spent: 30 minutes.     Hosie Poisson, MD [facility] Pager [phone]  If 7PM-7AM, please contact night-coverage [email or web address] Password [email or web address] 12/11/2016, 3:15 PM

## 2016-12-11 NOTE — Progress Notes (Signed)
Paged Dr. Reesa Chew on-call for [facility] regarding pt's asymptomatic 15 beat run of VTach. No orders received, will continue to monitor.

## 2016-12-11 NOTE — Consult Note (Signed)
           [facility] CM Primary Care Navigator  12/11/2016  HARLO HAMMERS August 14, 1946 [id number]    Went to see patientat the bedside to identify possible discharge needsbut staff reports that patient was transferred to 4E 05 related to traumatic foley catheter placement having bloody urine with clots.  Will attempt to meet with patient at another time, when transferred out of stepdown unit.   For questions, please contact:  Dannielle Huh, BSN, RN- [facility] Primary Care Navigator  Telephone: [phone] [place]

## 2016-12-11 NOTE — Progress Notes (Signed)
Subjective:  1 - Gross Hematuria - new gross hematuria with clots 12/10/16. Known h/o Rt perinephric hematoma. Also had recent complicated catheter placement and is on heparin around dialysis.  Started on bladder irrigation 3/1, slowed 3/2.   2 - Right Perinephric / Subcapsular Hematoma - noted on CT 11/2016, nearly resolved by CT this admission. NO large underlying masses.  3 - ESRD - on hemodialysis TTS at baseline. NO hydro on imaging x many.  Today "Albert Stanley" is stable less gross hematuria on irrigation.   Objective: Vital signs in last 24 hours: Temp:  [97.5 F (36.4 C)-98 F (36.7 C)] 98 F (36.7 C) (03/02 0751) Pulse Rate:  [90-108] 108 (03/01 2206) Resp:  [11-20] 17 (03/02 0751) BP: (131-159)/(67-93) 131/93 (03/02 0751) SpO2:  [95 %-100 %] 100 % (03/02 0751) Weight:  [94.2 kg (207 lb 10.8 oz)] 94.2 kg (207 lb 10.8 oz) (03/02 0324) Last BM Date: 12/09/16 (PTA)  Intake/Output from previous day: 03/01 0701 - 03/02 0700 In: 18060 [P.O.:60] Out: [id number] [Urine:32825] Intake/Output this shift: Total I/O In: 6240 [P.O.:240; Other:6000] Out: 5800 [Urine:5800]  General appearance: alert, cooperative and appears stated age Eyes: negative Nose: Nares normal. Septum midline. Mucosa normal. No drainage or sinus tenderness. Throat: lips, mucosa, and tongue normal; teeth and gums normal Neck: supple, symmetrical, trachea midline Back: symmetric, no curvature. ROM normal. No CVA tenderness. Resp: non-labored on room air.  Cardio: Nl rate GI: soft, non-tender; bowel sounds normal; no masses,  no organomegaly Male genitalia: normal, 3 way foley with dark pink urine on very brisk irrigation, this was titrated back to 1gtt / second.  Pulses: 2+ and symmetric Neurologic: Grossly normal  Lab Results:   Recent Labs  12/10/16 0515 12/11/16 0222  WBC 8.1 9.6  HGB 12.3* 10.0*  HCT 36.9* 29.7*  PLT 187 171   BMET  Recent Labs  12/10/16 0515 12/11/16 0222  NA 139 140  K 4.2 4.7   CL 101 102  CO2 19* 21*  GLUCOSE 155* 135*  BUN 80* 92*  CREATININE 8.25* 9.07*  CALCIUM 8.6* 8.7*   PT/INR No results for input(s): LABPROT, INR in the last 72 hours. ABG No results for input(s): PHART, HCO3 in the last 72 hours.  Invalid input(s): PCO2, PO2  Studies/Results: Dg Chest 2 View  Result Date: 12/09/2016 CLINICAL DATA:  Lower extremity swelling for 1 month. Shortness of breath. EXAM: CHEST  2 VIEW COMPARISON:  12/27/2015 FINDINGS: Dialysis catheter tip:  SVC. Tortuous thoracic aorta. Mild enlargement of the cardiopericardial silhouette. Stable scarring at the right lung base. Mild thoracic spondylosis. Upper zone pulmonary vascular prominence. IMPRESSION: 1. Mild enlargement of the cardiopericardial silhouette with prominent upper zone pulmonary vasculature suggesting cephalization of blood flow compatible with pulmonary venous hypertension. 2. Stable scarring at the right lung base. 3. Tortuous thoracic aorta. Electronically Signed   By: Van Clines M.D.   On: 12/09/2016 12:53   Dg Abd 2 Views  Result Date: 12/10/2016 CLINICAL DATA:  Abdominal pain for 2 days EXAM: ABDOMEN - 2 VIEW COMPARISON:  None. FINDINGS: Scattered large and small bowel gas is noted. Fecal material is noted throughout the colon without obstructive change. No abnormal mass or abnormal calcifications are seen. No free air is noted. Degenerative changes of the lumbar spine are seen. Left hip replacement is noted. IMPRESSION: Mild constipation although no obstructive changes are seen. Electronically Signed   By: Inez Catalina M.D.   On: 12/10/2016 13:34    Anti-infectives: Anti-infectives  None      Assessment/Plan:   1 - Gross Hematuria - most likely form prostate in setting of recent traumatic foley and heparin. Improving. Titrating down bladder irrigation, continue current catheter for now.   2 - Right Perinephric / Subcapsular Hematoma - nearly resolved, observe.   3 - ESRD - per  nephrology  Please call with questions.   [facility], Ambrie Carte 12/11/2016

## 2016-12-11 NOTE — Progress Notes (Signed)
Subjective: Patient reports suprpaubic pain and gross hematuria starting this morning  Objective: Vital signs in last 24 hours: Temp:  [97.5 F (36.4 C)-98 F (36.7 C)] 97.9 F (36.6 C) (03/02 0324) Pulse Rate:  [62-108] 108 (03/01 2206) Resp:  [11-20] 20 (03/02 0324) BP: (142-159)/(67-92) 151/92 (03/02 0324) SpO2:  [95 %-100 %] 100 % (03/02 0324) Weight:  [94.2 kg (207 lb 10.8 oz)] 94.2 kg (207 lb 10.8 oz) (03/02 0324)  Intake/Output from previous day: 03/01 0701 - 03/02 0700 In: 18060 [P.O.:60] Out: [id number] [Urine:32825] Intake/Output this shift: No intake/output data recorded.  Physical Exam:  General:alert, cooperative and appears stated age GI: tenderness: suprapubic Male genitalia: Penis: normal, no lesions Testicles: normal, no masses Scrotum: normal Bladder: bladder distension noted Extremities: edema 2+  Lab Results:  Recent Labs  12/09/16 2145 12/10/16 0515 12/11/16 0222  HGB 13.3 12.3* 10.0*  HCT 36.0* 36.9* 29.7*   BMET  Recent Labs  12/10/16 0515 12/11/16 0222  NA 139 140  K 4.2 4.7  CL 101 102  CO2 19* 21*  GLUCOSE 155* 135*  BUN 80* 92*  CREATININE 8.25* 9.07*  CALCIUM 8.6* 8.7*   No results for input(s): LABPT, INR in the last 72 hours. No results for input(s): LABURIN in the last 72 hours. Results for orders placed or performed during the hospital encounter of 12/09/16  MRSA PCR Screening     Status: Abnormal   Collection Time: 12/09/16  9:39 PM  Result Value Ref Range Status   MRSA by PCR POSITIVE (A) NEGATIVE Final    Comment:        The GeneXpert MRSA Assay (FDA approved for NASAL specimens only), is one component of a comprehensive MRSA colonization surveillance program. It is not intended to diagnose MRSA infection nor to guide or monitor treatment for MRSA infections. RESULT CALLED TO, READ BACK BY AND VERIFIED WITH: E CASTRO,RN @0012  12/10/16 MKELLY,MLT     Studies/Results: Dg Chest 2 View  Result Date:  12/09/2016 CLINICAL DATA:  Lower extremity swelling for 1 month. Shortness of breath. EXAM: CHEST  2 VIEW COMPARISON:  12/27/2015 FINDINGS: Dialysis catheter tip:  SVC. Tortuous thoracic aorta. Mild enlargement of the cardiopericardial silhouette. Stable scarring at the right lung base. Mild thoracic spondylosis. Upper zone pulmonary vascular prominence. IMPRESSION: 1. Mild enlargement of the cardiopericardial silhouette with prominent upper zone pulmonary vasculature suggesting cephalization of blood flow compatible with pulmonary venous hypertension. 2. Stable scarring at the right lung base. 3. Tortuous thoracic aorta. Electronically Signed   By: Van Clines M.D.   On: 12/09/2016 12:53   Dg Abd 2 Views  Result Date: 12/10/2016 CLINICAL DATA:  Abdominal pain for 2 days EXAM: ABDOMEN - 2 VIEW COMPARISON:  None. FINDINGS: Scattered large and small bowel gas is noted. Fecal material is noted throughout the colon without obstructive change. No abnormal mass or abnormal calcifications are seen. No free air is noted. Degenerative changes of the lumbar spine are seen. Left hip replacement is noted. IMPRESSION: Mild constipation although no obstructive changes are seen. Electronically Signed   By: Inez Catalina M.D.   On: 12/10/2016 13:34    Assessment/Plan: 71yo with hematuria and clot retention  1. 22 french 3 way foley catheter placed with prompt drainage of 1L of bloody urine. 300cc of clot irrigated from foley. Please continue continuous bladder irrigation and irrigate the foley PRN with 60cc normal saline for clots. Urology to continue to follow   LOS: 2 days   Nicolette Bang 12/11/2016,  7:29 AM

## 2016-12-12 LAB — GLUCOSE, CAPILLARY
GLUCOSE-CAPILLARY: 152 mg/dL — AB (ref 65–99)
GLUCOSE-CAPILLARY: 76 mg/dL (ref 65–99)
Glucose-Capillary: 95 mg/dL (ref 65–99)

## 2016-12-12 LAB — BASIC METABOLIC PANEL
ANION GAP: 15 (ref 5–15)
BUN: 33 mg/dL — ABNORMAL HIGH (ref 6–20)
CALCIUM: 7.8 mg/dL — AB (ref 8.9–10.3)
CO2: 26 mmol/L (ref 22–32)
Chloride: 94 mmol/L — ABNORMAL LOW (ref 101–111)
Creatinine, Ser: 4.42 mg/dL — ABNORMAL HIGH (ref 0.61–1.24)
GFR calc Af Amer: 14 mL/min — ABNORMAL LOW (ref >60)
GFR calc non Af Amer: 12 mL/min — ABNORMAL LOW (ref >60)
GLUCOSE: 75 mg/dL (ref 65–99)
Potassium: 3 mmol/L — ABNORMAL LOW (ref 3.5–5.1)
Sodium: 135 mmol/L (ref 135–145)

## 2016-12-12 LAB — CBC
HCT: 28 % — ABNORMAL LOW (ref 39.0–52.0)
Hemoglobin: 9.2 g/dL — ABNORMAL LOW (ref 13.0–17.0)
MCH: 28.9 pg (ref 26.0–34.0)
MCHC: 32.9 g/dL (ref 30.0–36.0)
MCV: 88.1 fL (ref 78.0–100.0)
Platelets: 159 10*3/uL (ref 150–400)
RBC: 3.18 MIL/uL — ABNORMAL LOW (ref 4.22–5.81)
RDW: 15.7 % — AB (ref 11.5–15.5)
WBC: 7.8 10*3/uL (ref 4.0–10.5)

## 2016-12-12 MED ORDER — CALCITRIOL 0.5 MCG PO CAPS
ORAL_CAPSULE | ORAL | Status: AC
  Filled 2016-12-12: qty 2

## 2016-12-12 MED ORDER — POTASSIUM CHLORIDE CRYS ER 20 MEQ PO TBCR
40.0000 meq | EXTENDED_RELEASE_TABLET | Freq: Once | ORAL | Status: AC
  Administered 2016-12-12: 40 meq via ORAL
  Filled 2016-12-12: qty 2

## 2016-12-12 MED ORDER — ALBUMIN HUMAN 25 % IV SOLN
INTRAVENOUS | Status: AC
  Administered 2016-12-12: 25 g via INTRAVENOUS
  Filled 2016-12-12: qty 100

## 2016-12-12 MED ORDER — ALBUMIN HUMAN 25 % IV SOLN
25.0000 g | Freq: Once | INTRAVENOUS | Status: AC
  Administered 2016-12-12: 25 g via INTRAVENOUS

## 2016-12-12 NOTE — Progress Notes (Signed)
Pt had 10 beat run of vtach. MD notified. Will continue to monitor.

## 2016-12-12 NOTE — Progress Notes (Signed)
PROGRESS NOTE    VIRAT DISTAD  [id number] DOB: [birth year] DOA: 12/09/2016 PCP: [name], MD    Brief Narrative: Albert Stanley is a 71 y.o. male with known ESRD on HD TTS, DM type II with complications of nephropathy, HTN, presented to [facility] ED with main concern of several days duration of progressively worsening dyspnea that initially started with exertion and has progressed to dyspnea at rest. He was found to be fluid overloaded, and was admitted for HD. He was also complaining of urinary retention and had traumatic foley catheter placement on 2/28 and had >500 ml of bloody urine. He continues to have intermittent bladder spasms. Urology consulted.    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  Slight improvement with IV lasix. IV lasix stopped.  HD last night and this am.  Repeat echocardiogram ordered showed LV EF of 20 to 25%. Diffuse hypokinesis and abnormal relaxation and increased filling pressure, grade 2 diastolic dysfunction.  Worsened LV function when compared to the study in 11/2015.  Daily weights and strict intake and output.    ESRD ON HD: as per renal.   Diabetes mellitus: with nephropathy.  CBG (last 3)   Recent Labs  12/11/16 2129 12/12/16 0805 12/12/16 1146  GLUCAP 127* 95 152*    Holding oral meds. Resume SSI.    Thrombocytopenia: monitor platelet count.    Urinary retention with traumatic foley placement with bloody urine , and associated bladder spasms.  Urology consulted , WAS ON bladder irrigation, urine clearing up.  Bladder irrigation stopped. No bladder spasms.  Monitor H&H.   ? Constipation: stool softeners ordered.   DVT prophylaxis: scd's Code Status: (Full) Family Communication: none at bedside.  Disposition Plan: pending further eval.   Consultants:   Urology   Nephrology.    Procedures: HD.    Antimicrobials: None.   Subjective: No new complaints.   Objective: Vitals:   12/12/16 0230 12/12/16 0512 12/12/16 0810 12/12/16 1149  BP: (!) 147/77 (!) 150/88 115/84 106/81  Pulse: 94 93 86 (!) 104  Resp: 16 14 17  (!) 21  Temp:  98.5 F (36.9 C) 98.3 F (36.8 C) 98.8 F (37.1 C)  TempSrc:  Oral Oral Oral  SpO2:  98% 98% 99%  Weight:  92.6 kg (204 lb 2.3 oz)    Height:        Intake/Output Summary (Last 24 hours) at 12/12/16 1424 Last data filed at 12/12/16 1149  Gross per 24 hour  Intake             6200 ml  Output             8625 ml  Net            -2425 ml   Filed Weights   12/10/16 2026 12/11/16 0324 12/12/16 0512  Weight: 94.2 kg (207 lb 10.8 oz) 94.2 kg (207 lb 10.8 oz) 92.6 kg (204 lb 2.3 oz)    Examination:  General exam: Appears COMFORTABLE.  Respiratory system: Clear to auscultation. Respiratory effort normal. Cardiovascular system: S1 &  S2 heard, RRR. No JVD, murmurs, rubs, gallops or clicks. No pedal edema. Gastrointestinal system: lower abd tenderness improved. , Normal bowel sounds heard. Central nervous system: Alert and oriented. No focal neurological deficits. Extremities: Symmetric 5 x 5 power.     Data Reviewed: I have personally reviewed following labs and imaging studies  CBC:  Recent Labs Lab 12/09/16 1201 12/09/16 2145 12/10/16 0515 12/11/16 0222  WBC 5.0 4.8 8.1 9.6  HGB 11.7* 13.3 12.3* 10.0*  HCT 35.5* 36.0* 36.9* 29.7*  MCV 88.5 89.1 88.9 87.6  PLT 146* 138* 187 [id number]   Basic Metabolic Panel:  Recent Labs Lab 12/09/16 1201 12/09/16 2145 12/10/16 0515 12/11/16 0222 12/12/16 0401  NA 136  --  139 140 135  K 3.9  --   4.2 4.7 3.0*  CL 96*  --  101 102 94*  CO2 21*  --  19* 21* 26  GLUCOSE 114*  --  155* 135* 75  BUN 73*  --  80* 92* 33*  CREATININE 7.83* 8.02* 8.25* 9.07* 4.42*  CALCIUM 8.2*  --  8.6* 8.7* 7.8*   GFR: Estimated Creatinine Clearance: 17.8 mL/min (by C-G formula based on SCr of 4.42 mg/dL (H)). Liver Function Tests: No results for input(s): AST, ALT, ALKPHOS, BILITOT, PROT, ALBUMIN in the last 168 hours. No results for input(s): LIPASE, AMYLASE in the last 168 hours. No results for input(s): AMMONIA in the last 168 hours. Coagulation Profile: No results for input(s): INR, PROTIME in the last 168 hours. Cardiac Enzymes: No results for input(s): CKTOTAL, CKMB, CKMBINDEX, TROPONINI in the last 168 hours. BNP (last 3 results) No results for input(s): PROBNP in the last 8760 hours. HbA1C:  Recent Labs  12/09/16 2145  HGBA1C 6.4*   CBG:  Recent Labs Lab 12/11/16 1216 12/11/16 1700 12/11/16 2129 12/12/16 0805 12/12/16 1146  GLUCAP 131* 159* 127* 95 152*   Lipid Profile: No results for input(s): CHOL, HDL, LDLCALC, TRIG, CHOLHDL, LDLDIRECT in the last 72 hours. Thyroid Function Tests: No results for input(s): TSH, T4TOTAL, FREET4, T3FREE, THYROIDAB in the last 72 hours. Anemia Panel: No results for input(s): VITAMINB12, FOLATE, FERRITIN, TIBC, IRON, RETICCTPCT in the last 72 hours. Sepsis Labs: No results for input(s): PROCALCITON, LATICACIDVEN in the last 168 hours.  Recent Results (from the past 240 hour(s))  MRSA PCR Screening     Status: Abnormal   Collection Time: 12/09/16  9:39 PM  Result Value Ref Range Status   MRSA by PCR POSITIVE (A) NEGATIVE Final    Comment:        The GeneXpert MRSA Assay (FDA approved for NASAL specimens only), is one component of a comprehensive MRSA colonization surveillance program. It is not intended to diagnose MRSA infection nor to guide or monitor treatment for MRSA infections. RESULT CALLED TO, READ BACK BY AND VERIFIED  WITH: E CASTRO,RN @0012  12/10/16 MKELLY,MLT          Radiology Studies: No results found.      Scheduled Meds: . allopurinol  300 mg Oral Daily  . atorvastatin  40 mg Oral QHS  . calcitRIOL  1 mcg Oral Q T,Th,Sa-HD  . carvedilol  3.125 mg Oral 2 times per day on Sun Mon Wed Fri  . Chlorhexidine Gluconate Cloth  6 each Topical Q0600  . darbepoetin (ARANESP) injection - DIALYSIS  150 mcg Intravenous Q Fri-HD  . enoxaparin (LOVENOX) injection  30 mg Subcutaneous Q24H  . feeding supplement  1 Container Oral BID BM  . feeding supplement (  PRO-STAT SUGAR FREE 64)  30 mL Oral BID  . insulin aspart  0-9 Units Subcutaneous TID WC  . loratadine  10 mg Oral Daily  . multivitamin  1 tablet Oral QHS  . mupirocin ointment  1 application Nasal BID  . senna-docusate  2 tablet Oral BID  . sevelamer carbonate  1,600 mg Oral TID WC   Continuous Infusions:   LOS: 3 days    Time spent: 30 minutes.     Hosie Poisson, MD [facility] Pager [phone]  If 7PM-7AM, please contact night-coverage [email or web address] Password [facility] 12/12/2016, 2:24 PM

## 2016-12-12 NOTE — Progress Notes (Signed)
  Subjective:  1 - Gross Hematuria - new gross hematuria with clots 12/10/16. Known h/o Rt perinephric hematoma. Also had recent complicated catheter placement and is on heparin around dialysis.  Started on bladder irrigation 3/1, slowed 3/2.   2 - Right Perinephric / Subcapsular Hematoma - noted on CT 11/2016, nearly resolved by CT this admission. NO large underlying masses.  3 - ESRD - on hemodialysis TTS at baseline. NO hydro on imaging x many.  Today "Albert Stanley" is stable, no recurrent clot retention on slowed bladder irrigation.   Objective: Vital signs in last 24 hours: Temp:  [97.9 F (36.6 C)-98.5 F (36.9 C)] 98.5 F (36.9 C) (03/03 0512) Pulse Rate:  [82-104] 93 (03/03 0512) Resp:  [11-21] 14 (03/03 0512) BP: (70-150)/(43-97) 150/88 (03/03 0512) SpO2:  [98 %-100 %] 98 % (03/03 0512) Weight:  [92.6 kg (204 lb 2.3 oz)] 92.6 kg (204 lb 2.3 oz) (03/03 0512) Last BM Date:  (about 1 week ago per pt)  Intake/Output from previous day: 03/02 0701 - 03/03 0700 In: 15680 [P.O.:680] Out: [id number] [Urine:19150] Intake/Output this shift: Total I/O In: -  Out: 3550 [Urine:3550]  General appearance: alert, cooperative, appears stated age and at baseline.  Eyes: negative Nose: Nares normal. Septum midline. Mucosa normal. No drainage or sinus tenderness. Throat: lips, mucosa, and tongue normal; teeth and gums normal Neck: supple, symmetrical, trachea midline Back: symmetric, no curvature. ROM normal. No CVA tenderness. Resp: non-labored on room air.  Cardio: 3 way foley with dark yellow urine, no clots or gross hematuria, on very slow irrigation, this was turned off.  GI: Nl rate Male genitalia: normal Skin: Skin color, texture, turgor normal. No rashes or lesions Neurologic: Grossly normal  Lab Results:   Recent Labs  12/10/16 0515 12/11/16 0222  WBC 8.1 9.6  HGB 12.3* 10.0*  HCT 36.9* 29.7*  PLT 187 171   BMET  Recent Labs  12/11/16 0222 12/12/16 0401  NA 140 135  K 4.7  3.0*  CL 102 94*  CO2 21* 26  GLUCOSE 135* 75  BUN 92* 33*  CREATININE 9.07* 4.42*  CALCIUM 8.7* 7.8*   PT/INR No results for input(s): LABPROT, INR in the last 72 hours. ABG No results for input(s): PHART, HCO3 in the last 72 hours.  Invalid input(s): PCO2, PO2  Studies/Results: Dg Abd 2 Views  Result Date: 12/10/2016 CLINICAL DATA:  Abdominal pain for 2 days EXAM: ABDOMEN - 2 VIEW COMPARISON:  None. FINDINGS: Scattered large and small bowel gas is noted. Fecal material is noted throughout the colon without obstructive change. No abnormal mass or abnormal calcifications are seen. No free air is noted. Degenerative changes of the lumbar spine are seen. Left hip replacement is noted. IMPRESSION: Mild constipation although no obstructive changes are seen. Electronically Signed   By: Inez Catalina M.D.   On: 12/10/2016 13:34    Anti-infectives: Anti-infectives    None      Assessment/Plan:  1 - Gross Hematuria - most likely form prostate in setting of recent traumatic foley and heparin. Improving and now off irrigation. Current catheter to remain at least 10 days before trial of void.   2 - Right Perinephric / Subcapsular Hematoma - nearly resolved, observe.   3 - ESRD - per nephrology  Please call with questions.   [facility], Ivet Guerrieri 12/12/2016

## 2016-12-12 NOTE — Progress Notes (Signed)
Subjective:  Bladder irrigation slowed per urology.  Finally did get HD late last night after last HD was on Saturday 2/24- it was not noted how much UF but attempted for 3 liters - reportedly only took less than a liter due to hypotension  ?  Maybe because it was in middle of night   Objective Vital signs in last 24 hours: Vitals:   12/12/16 0130 12/12/16 0200 12/12/16 0230 12/12/16 0512  BP: (!) 125/56 (!) 139/48 (!) 147/77 (!) 150/88  Pulse: 99 (!) 104 94 93  Resp: 20 18 16 14   Temp:    98.5 F (36.9 C)  TempSrc:    Oral  SpO2:    98%  Weight:    92.6 kg (204 lb 2.3 oz)  Height:       Weight change: -1.6 kg (-3 lb 8.4 oz)  Intake/Output Summary (Last 24 hours) at 12/12/16 0718 Last data filed at 12/12/16 0516  Gross per 24 hour  Intake            15680 ml  Output            19150 ml  Net            -3470 ml   Dialysis Orders:  TTS at [facility] 4:15 hours, 400/800, EDW 95kg, 2K/2.25Ca, AVF - Heparin 7000 bolus - Mircera 179mcg q 2 weeks (last 2/15) - Calcitriol PO 2.3mcg q HD  Assessment/Plan: 1.  Dyspnea/volume overload: Trying to remove extra fluid but HD got delayed.  To get HD today 2 days in a row which should help symptoms. Is under EDW 2.  ESRD: Normally TTS- Missed Tuesday 2/27- could not do 3/1 due to his bladder issues, finally done late last night. No heparin due to bleeding with foley. Will need to do today as well to get back on schedule.  K was low this AM but because he just got off machine- will be done I assume much later today.  May need to be done Monday as well  3.  Hypertension/volume: See above, lowering EDW as tolerated. 4.  Anemia: Hgb 12.3---10 likely due to hematuria , resumed ESA  5.  Metabolic bone disease: Ca ok, Phos pending. Continue binders- renvela/VDRA (calcitriol) 6.  Type 2 DM: Per primary 7. Bladder issues. Now with 3 way foley and bladder irrigation.  Per urology - pt feels much better - not sure if anything definitive needs to be done this  hosp     Nella Botsford A    Labs: Basic Metabolic Panel:  Recent Labs Lab 12/10/16 0515 12/11/16 0222 12/12/16 0401  NA 139 140 135  K 4.2 4.7 3.0*  CL 101 102 94*  CO2 19* 21* 26  GLUCOSE 155* 135* 75  BUN 80* 92* 33*  CREATININE 8.25* 9.07* 4.42*  CALCIUM 8.6* 8.7* 7.8*   Liver Function Tests: No results for input(s): AST, ALT, ALKPHOS, BILITOT, PROT, ALBUMIN in the last 168 hours. No results for input(s): LIPASE, AMYLASE in the last 168 hours. No results for input(s): AMMONIA in the last 168 hours. CBC:  Recent Labs Lab 12/09/16 1201 12/09/16 2145 12/10/16 0515 12/11/16 0222  WBC 5.0 4.8 8.1 9.6  HGB 11.7* 13.3 12.3* 10.0*  HCT 35.5* 36.0* 36.9* 29.7*  MCV 88.5 89.1 88.9 87.6  PLT 146* 138* 187 171   Cardiac Enzymes: No results for input(s): CKTOTAL, CKMB, CKMBINDEX, TROPONINI in the last 168 hours. CBG:  Recent Labs Lab 12/10/16 1856 12/10/16 2110 12/11/16 1216 12/11/16 1700  12/11/16 2129  GLUCAP 173* 158* 131* 159* 127*    Iron Studies: No results for input(s): IRON, TIBC, TRANSFERRIN, FERRITIN in the last 72 hours. Studies/Results: Dg Abd 2 Views  Result Date: 12/10/2016 CLINICAL DATA:  Abdominal pain for 2 days EXAM: ABDOMEN - 2 VIEW COMPARISON:  None. FINDINGS: Scattered large and small bowel gas is noted. Fecal material is noted throughout the colon without obstructive change. No abnormal mass or abnormal calcifications are seen. No free air is noted. Degenerative changes of the lumbar spine are seen. Left hip replacement is noted. IMPRESSION: Mild constipation although no obstructive changes are seen. Electronically Signed   By: Inez Catalina M.D.   On: 12/10/2016 13:34   Medications: Infusions:   Scheduled Medications: . allopurinol  300 mg Oral Daily  . atorvastatin  40 mg Oral QHS  . calcitRIOL  1 mcg Oral Q T,Th,Sa-HD  . carvedilol  3.125 mg Oral 2 times per day on Sun Mon Wed Fri  . Chlorhexidine Gluconate Cloth  6 each Topical  Q0600  . darbepoetin (ARANESP) injection - DIALYSIS  150 mcg Intravenous Q Fri-HD  . enoxaparin (LOVENOX) injection  30 mg Subcutaneous Q24H  . feeding supplement  1 Container Oral BID BM  . feeding supplement (PRO-STAT SUGAR FREE 64)  30 mL Oral BID  . insulin aspart  0-9 Units Subcutaneous TID WC  . loratadine  10 mg Oral Daily  . multivitamin  1 tablet Oral QHS  . mupirocin ointment  1 application Nasal BID  . senna-docusate  2 tablet Oral BID  . sevelamer carbonate  1,600 mg Oral TID WC    have reviewed scheduled and prn medications.  Physical Exam: General: NAD- many requests- face puffy Heart: RRR Lungs: mostly clear Abdomen: soft, non tender Extremities: minimal edema Dialysis Access: AVF     12/12/2016,7:18 AM  LOS: 3 days

## 2016-12-13 DIAGNOSIS — I429 Cardiomyopathy, unspecified: Secondary | ICD-10-CM

## 2016-12-13 DIAGNOSIS — N186 End stage renal disease: Secondary | ICD-10-CM

## 2016-12-13 DIAGNOSIS — I10 Essential (primary) hypertension: Secondary | ICD-10-CM

## 2016-12-13 DIAGNOSIS — I5043 Acute on chronic combined systolic (congestive) and diastolic (congestive) heart failure: Secondary | ICD-10-CM

## 2016-12-13 LAB — GLUCOSE, CAPILLARY
GLUCOSE-CAPILLARY: 88 mg/dL (ref 65–99)
GLUCOSE-CAPILLARY: 93 mg/dL (ref 65–99)
GLUCOSE-CAPILLARY: 159 mg/dL — AB (ref 65–99)
GLUCOSE-CAPILLARY: 230 mg/dL — AB (ref 65–99)
Glucose-Capillary: 114 mg/dL — ABNORMAL HIGH (ref 65–99)

## 2016-12-13 LAB — RENAL FUNCTION PANEL
ALBUMIN: 3 g/dL — AB (ref 3.5–5.0)
ANION GAP: 12 (ref 5–15)
BUN: 14 mg/dL (ref 6–20)
CHLORIDE: 100 mmol/L — AB (ref 101–111)
CO2: 24 mmol/L (ref 22–32)
Calcium: 7.6 mg/dL — ABNORMAL LOW (ref 8.9–10.3)
Creatinine, Ser: 2.6 mg/dL — ABNORMAL HIGH (ref 0.61–1.24)
GFR calc Af Amer: 27 mL/min — ABNORMAL LOW (ref >60)
GFR calc non Af Amer: 23 mL/min — ABNORMAL LOW (ref >60)
GLUCOSE: 73 mg/dL (ref 65–99)
PHOSPHORUS: 1.8 mg/dL — AB (ref 2.5–4.6)
POTASSIUM: 4.2 mmol/L (ref 3.5–5.1)
Sodium: 136 mmol/L (ref 135–145)

## 2016-12-13 LAB — MAGNESIUM: MAGNESIUM: 1.8 mg/dL (ref 1.7–2.4)

## 2016-12-13 MED ORDER — AMIODARONE HCL 200 MG PO TABS
400.0000 mg | ORAL_TABLET | Freq: Two times a day (BID) | ORAL | Status: DC
  Administered 2016-12-13 – 2016-12-18 (×11): 400 mg via ORAL
  Filled 2016-12-13 (×11): qty 2

## 2016-12-13 NOTE — Progress Notes (Signed)
  Subjective:  1 - Gross Hematuria - new gross hematuria with clots 12/10/16. Known h/o Rt perinephric hematoma. Also had recent complicated catheter placement and is on heparin around dialysis.  Started on bladder irrigation 3/1, slowed 3/2, stopped 3/3, restarted 3/4 as recurrent clot retention.   2 - Right Perinephric / Subcapsular Hematoma - noted on CT 11/2016, nearly resolved by CT this admission. NO large underlying masses.  3 - ESRD - on hemodialysis TTS at baseline. NO hydro on imaging x many.  Today "Albert Stanley" is c/o increased SP pain, he has clotted off his catheter.   Objective: Vital signs in last 24 hours: Temp:  [98.1 F (36.7 C)-99.3 F (37.4 C)] 98.6 F (37 C) (03/04 0349) Pulse Rate:  [45-130] 87 (03/04 0349) Resp:  [13-21] 13 (03/04 0349) BP: (94-150)/(41-89) 138/78 (03/04 0349) SpO2:  [98 %-100 %] 100 % (03/04 0349) Weight:  [90 kg (198 lb 6.6 oz)-92.6 kg (204 lb 2.3 oz)] 90 kg (198 lb 6.6 oz) (03/04 0137) Last BM Date: 12/12/16  Intake/Output from previous day: 03/03 0701 - 03/04 0700 In: 3000  Out: 4850 [Urine:2350] Intake/Output this shift: Total I/O In: -  Out: 2600 [Urine:100; Other:2500]  General appearance: alert, cooperative and appears stated age Eyes: negative Nose: Nares normal. Septum midline. Mucosa normal. No drainage or sinus tenderness. Throat: lips, mucosa, and tongue normal; teeth and gums normal Neck: supple, symmetrical, trachea midline Back: symmetric, no curvature. ROM normal. No CVA tenderness. Resp: non-labored on room air.  Cardio: Nl rate GI: soft, non-tender; bowel sounds normal; no masses,  no organomegaly Male genitalia: current catheter with dark clotted blood. This was irrigated by hand wtih 1561mL NS in 60cc aliquots to very light pink and restartc on CBI. Placed on two strap traction and showed to NSG.  Extremities: extremities normal, atraumatic, no cyanosis or edema Skin: Skin color, texture, turgor normal. No rashes or  lesions Lymph nodes: Cervical, supraclavicular, and axillary nodes normal. Neurologic: Grossly normal  Lab Results:   Recent Labs  12/11/16 0222 12/12/16 2110  WBC 9.6 7.8  HGB 10.0* 9.2*  HCT 29.7* 28.0*  PLT 171 159   BMET  Recent Labs  12/12/16 0401 12/13/16 0201  NA 135 136  K 3.0* 4.2  CL 94* 100*  CO2 26 24  GLUCOSE 75 73  BUN 33* 14  CREATININE 4.42* 2.60*  CALCIUM 7.8* 7.6*   PT/INR No results for input(s): LABPROT, INR in the last 72 hours. ABG No results for input(s): PHART, HCO3 in the last 72 hours.  Invalid input(s): PCO2, PO2  Studies/Results: No results found.  Anti-infectives: Anti-infectives    None      Assessment/Plan:  1 - Gross Hematuria - most likely form prostate in setting of recent traumatic foley and heparin. Continue restarted CBI, now on traction.  2 - Right Perinephric / Subcapsular Hematoma - nearly resolved, observe.   3 - ESRD - per nephrology  Please call with questions.   [facility], Albert Stanley 12/13/2016

## 2016-12-13 NOTE — Progress Notes (Signed)
PROGRESS NOTE    Albert Stanley  [id number] DOB: January 23, 1946 DOA: 12/09/2016 PCP: [name], MD    Brief Narrative: POLLUX EYE is a 71 y.o. male with known ESRD on HD TTS, DM type II with complications of nephropathy, HTN, presented to [facility] ED with main concern of several days duration of progressively worsening dyspnea that initially started with exertion and has progressed to dyspnea at rest. He was found to be fluid overloaded, and was admitted for HD. He was also complaining of urinary retention and had traumatic foley catheter placement on 2/28 and had >500 ml of bloody urine. He continues to have intermittent bladder spasms. Urology consulted and was started on continuous bladder irrigation. Meanwhile he was started on HD, had two sessions so far.his repeat ECHO shows worsening of LV dysfunction from 40 to 20%. Cardiology consulted for recommendations.     Assessment & Plan:   Principal Problem:   Fluid overload Active Problems:   H/O: gout   ESRD on hemodialysis ([place])   Dyslipidemia associated with type 2 diabetes mellitus ([name])   Acute on chronic systolic and diastolic heart failure, NYHA class 1 ([name])   Diabetes mellitus with diabetic nephropathy without long-term current use of insulin ([name])   Mild acute on chronic systolic and diastolic heart failure with fluid overload: Probably from missing HD on 2/27.  Slight improvement with IV lasix. IV lasix stopped as HD resumed. 2 sessions of HD so far. He does not appear to be fluid overloaded.  Repeat echocardiogram ordered showed LV EF of 20 to 25%. Diffuse hypokinesis and abnormal relaxation and increased filling pressure, grade 2 diastolic dysfunction.  Worsened LV function when compared to the study in 11/2015. Cardiology consulted for recommendations. 10 beat of V tach yesterday. Magnesium and potassium levels are wnl. Pt was asymptomatic during the PVC'S. Daily weights and strict intake and output.    ESRD ON  HD: as per renal.   Diabetes mellitus: with nephropathy.  CBG (last 3)   Recent Labs  12/13/16 0208 12/13/16 0802 12/13/16 1219  GLUCAP 88 93 159*    Holding oral meds. Resume SSI.    Thrombocytopenia: monitor platelet count.    Urinary retention with traumatic foley placement with bloody urine , and associated bladder spasms.  Urology consulted , he was started on continuous bladder irrigation on 3/1 to 3/3, it was stopped on 3/3, today pt reports worsening pain in the lower abdomen and he was found to be have clots, bladder irrigation restarted by urology.   ? Constipation: stool softeners ordered.   DVT prophylaxis: scd's Code Status: (Full) Family Communication: none at bedside.  Disposition Plan: pending further eval.   Consultants:   Urology   Nephrology.    Procedures: HD.    Antimicrobials: None.   Subjective: Some abdominal cramps.   Objective: Vitals:   12/13/16 0350 12/13/16 0855 12/13/16 1217 12/13/16 1224  BP: 138/78 (!) 142/96  137/74  Pulse: 88     Resp: 13 14  18   Temp:  99.6 F (37.6 C)  98.8 F (37.1 C)  TempSrc:  Oral Oral Oral  SpO2: 99%   96%  Weight:      Height:        Intake/Output Summary (Last 24 hours) at 12/13/16 1457 Last data filed at 12/13/16 0538  Gross per 24 hour  Intake              240 ml  Output  2975 ml  Net            -2735 ml   Filed Weights   12/12/16 0512 12/12/16 2100 12/13/16 0137  Weight: 92.6 kg (204 lb 2.3 oz) 92.5 kg (203 lb 14.8 oz) 90 kg (198 lb 6.6 oz)    Examination:  General exam: Appears COMFORTABLE.  Respiratory system: Clear to auscultation. Respiratory effort normal. Cardiovascular system: S1 & S2 heard, RRR. No JVD, murmurs, rubs, gallops or clicks. No pedal edema. Gastrointestinal system: lower abd tenderness improved. , Normal bowel sounds heard. Central nervous system: Alert and oriented. No focal neurological deficits. Extremities: Symmetric 5 x 5  power.     Data Reviewed: I have personally reviewed following labs and imaging studies  CBC:  Recent Labs Lab 12/09/16 1201 12/09/16 2145 12/10/16 0515 12/11/16 0222 12/12/16 2110  WBC 5.0 4.8 8.1 9.6 7.8  HGB 11.7* 13.3 12.3* 10.0* 9.2*  HCT 35.5* 36.0* 36.9* 29.7* 28.0*  MCV 88.5 89.1 88.9 87.6 88.1  PLT 146* 138* 187 171 [id number]   Basic Metabolic Panel:  Recent Labs Lab 12/09/16 1201 12/09/16 2145 12/10/16 0515 12/11/16 0222 12/12/16 0401 12/13/16 0201 12/13/16 1011  NA 136  --  139 140 135 136  --   K 3.9  --  4.2 4.7 3.0* 4.2  --   CL 96*  --  101 102 94* 100*  --   CO2 21*  --  19* 21* 26 24  --   GLUCOSE 114*  --  155* 135* 75 73  --   BUN 73*  --  80* 92* 33* 14  --   CREATININE 7.83* 8.02* 8.25* 9.07* 4.42* 2.60*  --   CALCIUM 8.2*  --  8.6* 8.7* 7.8* 7.6*  --   MG  --   --   --   --   --   --  1.8  PHOS  --   --   --   --   --  1.8*  --    GFR: Estimated Creatinine Clearance: 27.8 mL/min (by C-G formula based on SCr of 2.6 mg/dL (H)). Liver Function Tests:  Recent Labs Lab 12/13/16 0201  ALBUMIN 3.0*   No results for input(s): LIPASE, AMYLASE in the last 168 hours. No results for input(s): AMMONIA in the last 168 hours. Coagulation Profile: No results for input(s): INR, PROTIME in the last 168 hours. Cardiac Enzymes: No results for input(s): CKTOTAL, CKMB, CKMBINDEX, TROPONINI in the last 168 hours. BNP (last 3 results) No results for input(s): PROBNP in the last 8760 hours. HbA1C: No results for input(s): HGBA1C in the last 72 hours. CBG:  Recent Labs Lab 12/12/16 1146 12/12/16 1658 12/13/16 0208 12/13/16 0802 12/13/16 1219  GLUCAP 152* 76 88 93 159*   Lipid Profile: No results for input(s): CHOL, HDL, LDLCALC, TRIG, CHOLHDL, LDLDIRECT in the last 72 hours. Thyroid Function Tests: No results for input(s): TSH, T4TOTAL, FREET4, T3FREE, THYROIDAB in the last 72 hours. Anemia Panel: No results for input(s): VITAMINB12, FOLATE,  FERRITIN, TIBC, IRON, RETICCTPCT in the last 72 hours. Sepsis Labs: No results for input(s): PROCALCITON, LATICACIDVEN in the last 168 hours.  Recent Results (from the past 240 hour(s))  MRSA PCR Screening     Status: Abnormal   Collection Time: 12/09/16  9:39 PM  Result Value Ref Range Status   MRSA by PCR POSITIVE (A) NEGATIVE Final    Comment:        The GeneXpert MRSA Assay (FDA approved for NASAL  specimens only), is one component of a comprehensive MRSA colonization surveillance program. It is not intended to diagnose MRSA infection nor to guide or monitor treatment for MRSA infections. RESULT CALLED TO, READ BACK BY AND VERIFIED WITH: E CASTRO,RN @0012  12/10/16 MKELLY,MLT          Radiology Studies: No results found.      Scheduled Meds: . allopurinol  300 mg Oral Daily  . amiodarone  400 mg Oral BID  . atorvastatin  40 mg Oral QHS  . calcitRIOL  1 mcg Oral Q T,Th,Sa-HD  . carvedilol  3.125 mg Oral 2 times per day on Sun Mon Wed Fri  . Chlorhexidine Gluconate Cloth  6 each Topical Q0600  . darbepoetin (ARANESP) injection - DIALYSIS  150 mcg Intravenous Q Fri-HD  . enoxaparin (LOVENOX) injection  30 mg Subcutaneous Q24H  . feeding supplement  1 Container Oral BID BM  . feeding supplement (PRO-STAT SUGAR FREE 64)  30 mL Oral BID  . insulin aspart  0-9 Units Subcutaneous TID WC  . loratadine  10 mg Oral Daily  . multivitamin  1 tablet Oral QHS  . mupirocin ointment  1 application Nasal BID  . senna-docusate  2 tablet Oral BID   Continuous Infusions:   LOS: 4 days    Time spent: 30 minutes.     Hosie Poisson, MD [facility] Pager [phone]  If 7PM-7AM, please contact night-coverage [email or web address] Password [email or web address] 12/13/2016, 2:57 PM

## 2016-12-13 NOTE — Progress Notes (Signed)
Subjective:  HD again last night- removed 2500 Objective Vital signs in last 24 hours: Vitals:   12/13/16 0120 12/13/16 0137 12/13/16 0349 12/13/16 0350  BP: (!) 143/83 138/85 138/78 138/78  Pulse: 95 97 87 88  Resp: 19 20 13 13   Temp:  98.7 F (37.1 C) 98.6 F (37 C)   TempSrc:  Oral Oral   SpO2:   100% 99%  Weight:  90 kg (198 lb 6.6 oz)    Height:       Weight change: -0.1 kg (-3.5 oz)  Intake/Output Summary (Last 24 hours) at 12/13/16 0855 Last data filed at 12/13/16 0538  Gross per 24 hour  Intake              240 ml  Output             3850 ml  Net            -3610 ml   Dialysis Orders:  TTS at [facility] 4:15 hours, 400/800, EDW 95kg, 2K/2.25Ca, AVF - Heparin 7000 bolus - Mircera 185mcg q 2 weeks (last 2/15) - Calcitriol PO 2.38mcg q HD  Assessment/Plan: 1.  Dyspnea/volume overload: Trying to remove extra fluid but HD got delayed.  To get HD today 2 days in a row (Fri and Sat) which should help symptoms. Is under EDW- sat good on RA- will need reset lower as OP 2.  ESRD: Normally TTS via AVF- Missed Tuesday 2/27- could not do 3/1 due to his bladder issues, finally done Fri night and Sat night. No heparin due to bleeding with foley. Will plan next for Tuesday   3.  Hypertension/volume: See above, lowering EDW as tolerated. 4.  Anemia: Hgb 12.3---10---> 9.2  likely due to hematuria , resumed ESA  5.  Metabolic bone disease: Ca ok, Phos low. Continue calcitriol- will hold binder 6.  Type 2 DM: Per primary 7. Bladder issues. Now with 3 way foley and bladder irrigation.  Per urology - pt feels much better - not sure if anything definitive needs to be done this hosp- pt wonders if his bladder cancer is back      [place]: Basic Metabolic Panel:  Recent Labs Lab 12/11/16 0222 12/12/16 0401 12/13/16 0201  NA 140 135 136  K 4.7 3.0* 4.2  CL 102 94* 100*  CO2 21* 26 24  GLUCOSE 135* 75 73  BUN 92* 33* 14  CREATININE 9.07* 4.42* 2.60*  CALCIUM  8.7* 7.8* 7.6*  PHOS  --   --  1.8*   Liver Function Tests:  Recent Labs Lab 12/13/16 0201  ALBUMIN 3.0*   No results for input(s): LIPASE, AMYLASE in the last 168 hours. No results for input(s): AMMONIA in the last 168 hours. CBC:  Recent Labs Lab 12/09/16 1201 12/09/16 2145 12/10/16 0515 12/11/16 0222 12/12/16 2110  WBC 5.0 4.8 8.1 9.6 7.8  HGB 11.7* 13.3 12.3* 10.0* 9.2*  HCT 35.5* 36.0* 36.9* 29.7* 28.0*  MCV 88.5 89.1 88.9 87.6 88.1  PLT 146* 138* 187 171 159   Cardiac Enzymes: No results for input(s): CKTOTAL, CKMB, CKMBINDEX, TROPONINI in the last 168 hours. CBG:  Recent Labs Lab 12/12/16 0805 12/12/16 1146 12/12/16 1658 12/13/16 0208 12/13/16 0802  GLUCAP 95 152* 76 88 93    Iron Studies: No results for input(s): IRON, TIBC, TRANSFERRIN, FERRITIN in the last 72 hours. Studies/Results: No results found. Medications: Infusions:   Scheduled Medications: . allopurinol  300 mg Oral Daily  .  atorvastatin  40 mg Oral QHS  . calcitRIOL  1 mcg Oral Q T,Th,Sa-HD  . carvedilol  3.125 mg Oral 2 times per day on Sun Mon Wed Fri  . Chlorhexidine Gluconate Cloth  6 each Topical Q0600  . darbepoetin (ARANESP) injection - DIALYSIS  150 mcg Intravenous Q Fri-HD  . enoxaparin (LOVENOX) injection  30 mg Subcutaneous Q24H  . feeding supplement  1 Container Oral BID BM  . feeding supplement (PRO-STAT SUGAR FREE 64)  30 mL Oral BID  . insulin aspart  0-9 Units Subcutaneous TID WC  . loratadine  10 mg Oral Daily  . multivitamin  1 tablet Oral QHS  . mupirocin ointment  1 application Nasal BID  . senna-docusate  2 tablet Oral BID  . sevelamer carbonate  1,600 mg Oral TID WC    have reviewed scheduled and prn medications.  Physical Exam: General: NAD- many requests- face puffy Heart: RRR Lungs: mostly clear Abdomen: soft, non tender Extremities: minimal edema Dialysis Access: AVF     12/13/2016,8:55 AM  LOS: 4 days

## 2016-12-13 NOTE — Consult Note (Signed)
CARDIOLOGY CONSULT NOTE   Patient ID: Albert Stanley MRN: [id number] DOB/AGE: Nov 23, 1945 71 y.o.  Admit date: 12/09/2016  Requesting Physician: Dr. Karleen Hampshire Primary Physician:   [name], MD Primary Cardiologist: [name] Reason for Consultation: worsening LV systolic function   HPI: Albert Stanley is a 71 y.o. male with a history of HTN, right renal subcapsular hematoma, DMT2, obesity, DVT, ESRD on HD (TTS), HLD, bladder cancer, and NICM (EF 35-40%) who presented to [facility] on 12/09/16 with worsening LE edema and dyspnea in the setting of missing dialysis. 2D ECHO showed worsening of LVEF and cardiology consulted.   In 11/2015, he was admitted with a right renal subcapsular hematoma.  He had been on warfarin for history of DVT, this was stopped.  Echo during that admission showed EF 35-40% with wall motion abnormalities concerning for CAD.  Cardiac cath was not done as creatinine was high and he was not yet on dialysis. Since that time, he has started HD.  He had a Cardiolite in 4/17 showing a large fixed inferior defect with EF 39%, intermediate risk study. LHC was done in 5/17, showing no obstructive CAD and EF about 40%.    He was in his usual state of health until last week when he had flu like symptoms causing him to miss hemodialysis. He then began to have worsening LE edema and dyspnea prompting him to present to the ER. He was given one dose of IV lasix (can make some urine) and admitted to hospital by [place]. Nephrology consulted. He was also complaining of urinary retention and had traumatic foley catheter placement on 2/28 and had >500 ml of bloody urine. He continues to have intermittent bladder spasms. Urology consulted. 2D ECHO this admission showed EF 20-25%, mild LVH, diffuse HK, G2DD with elevated filling pressures, mild MR, severe LAE, pulm HTN (PA pressure 56) and trivial pericardial effusion.   He had HD back to back Friday and sat (session on 3/1 cancelled due  to bladder spasms). He is feeling better. No CP or SOB. No orthopnea or PND. No dizziness or syncope. No palpitations. He doesn't know why " everyone is saying he is so short of breath. He just came the hospital because his orthopedic dr told him to." He says he has had LE edema for about 1 month.    Past Medical History:  Diagnosis Date  . Asthma   . Bladder cancer ([place]) dx'd 1990   surg only  . Diabetes mellitus without complication ([place])   . ESRD (end stage renal disease) ([name])    Tues, Thurs, Sat dialysis  . History of cardiac catheterization    a. LHC 5/17: no obstructive CAD  . History of DVT (deep vein thrombosis)   . History of nuclear stress test    a. Myoview 4/17: EF 39%, inf, inf-lat, apical inf, apical lat, apical scar, intermediate risk  . Hyperlipidemia   . Hypertension   . NICM (nonischemic cardiomyopathy) ([place])    a. Echo 2/17: mod LVH, EF 35-40%, inf-lat and inf-sept HK, mod MR, severe LAE, small pericardial effusion     Past Surgical History:  Procedure Laterality Date  . arthroscopic knee surgery    . AV FISTULA PLACEMENT Left 12/13/2015   Procedure: ARTERIOVENOUS (AV) FISTULA CREATION;  Surgeon: Angelia Mould, MD;  Location: [employer];  Service: Vascular;  Laterality: Left;  . BACK SURGERY     2 times  . CARDIAC CATHETERIZATION N/A 02/28/2016   Procedure:  Left Heart Cath and Coronary Angiography;  Surgeon: Larey Dresser, MD;  Location: [place];  Service: Cardiovascular;  Laterality: N/A;  . CYSTOSCOPY    . FISTULA SUPERFICIALIZATION Left 08/14/2016   Procedure: FISTULA SUPERFICIALIZATION LEFT UPPER ARM;  Surgeon: Angelia Mould, MD;  Location: [place];  Service: Vascular;  Laterality: Left;  . INSERTION OF DIALYSIS CATHETER N/A 12/13/2015   Procedure: INSERTION OF DIALYSIS CATHETER;  Surgeon: Angelia Mould, MD;  Location: [place];  Service: Vascular;  Laterality: N/A;  . JOINT REPLACEMENT     hip  . LIGATION OF COMPETING BRANCHES OF  ARTERIOVENOUS FISTULA Left 08/14/2016   Procedure: LIGATION OF COMPETING BRANCHES OF ARTERIOVENOUS FISTULA LEFT UPPER ARM;  Surgeon: Angelia Mould, MD;  Location: [place];  Service: Vascular;  Laterality: Left;  . PERIPHERAL VASCULAR CATHETERIZATION Left 06/09/2016   Procedure: Fistulagram;  Surgeon: Serafina Mitchell, MD;  Location: [place] [facility];  Service: Cardiovascular;  Laterality: Left;  ARM  . PERIPHERAL VASCULAR CATHETERIZATION Left 06/09/2016   Procedure: Peripheral Vascular Balloon Angioplasty;  Surgeon: Serafina Mitchell, MD;  Location: [place] [facility];  Service: Cardiovascular;  Laterality: Left;  upper arm venous  . PERIPHERAL VASCULAR CATHETERIZATION Left 06/25/2016   Procedure: Fistulagram;  Surgeon: Serafina Mitchell, MD;  Location: [place] [facility];  Service: Cardiovascular;  Laterality: Left;    Allergies  Allergen Reactions  . Meperidine Shortness Of Breath and Other (See Comments)    Increased heart rate Reaction to demerol  . Sulfa Antibiotics Shortness Of Breath and Other (See Comments)    Increased heart rate  . Sulfonamide Derivatives Shortness Of Breath and Other (See Comments)    Increased heart rate  . Eggs Or Egg-Derived Products Nausea And Vomiting     Reaction to egg yolks - not whites    . Foltx [Elfolate Plus] Other (See Comments)    Pt does not recall reaction to this  . Lac Bovis Diarrhea, Nausea And Vomiting and Other (See Comments)    Constipation Reaction to whole milk  . Lactose Intolerance (Gi) Diarrhea, Nausea And Vomiting and Other (See Comments)    Constipation Reaction to whole milk  . Milk-Related Compounds Diarrhea, Nausea And Vomiting and Other (See Comments)    Constipation Reaction to whole milk  . Penicillins     "Knocks me out."   Has patient had a PCN reaction causing immediate rash, facial/tongue/throat swelling, SOB or lightheadedness with hypotension: yes- blacked out Has patient had a PCN reaction causing severe rash  involving mucus membranes or skin necrosis: unknown Has patient had a PCN reaction that required hospitalization: at MD office Has patient had a PCN reaction occurring within the last 10 years: no If all of the above answers are "NO", then may proceed with Cephalosporin use.   . Betadine [Povidone Iodine] Itching, Rash and Other (See Comments)    burning    I have reviewed the patient's current medications . allopurinol  300 mg Oral Daily  . atorvastatin  40 mg Oral QHS  . calcitRIOL  1 mcg Oral Q T,Th,Sa-HD  . carvedilol  3.125 mg Oral 2 times per day on Sun Mon Wed Fri  . Chlorhexidine Gluconate Cloth  6 each Topical Q0600  . darbepoetin (ARANESP) injection - DIALYSIS  150 mcg Intravenous Q Fri-HD  . enoxaparin (LOVENOX) injection  30 mg Subcutaneous Q24H  . feeding supplement  1 Container Oral BID BM  . feeding supplement (PRO-STAT SUGAR FREE  64)  30 mL Oral BID  . insulin aspart  0-9 Units Subcutaneous TID WC  . loratadine  10 mg Oral Daily  . multivitamin  1 tablet Oral QHS  . mupirocin ointment  1 application Nasal BID  . senna-docusate  2 tablet Oral BID    acetaminophen **OR** acetaminophen, albuterol, benzonatate, bisacodyl, cyclobenzaprine, famotidine, fluticasone, guaiFENesin-dextromethorphan, ondansetron **OR** ondansetron (ZOFRAN) IV, oxyCODONE-acetaminophen  Prior to Admission medications   Medication Sig Start Date End Date Taking? Authorizing Provider  albuterol (PROVENTIL HFA;VENTOLIN HFA) 108 (90 Base) MCG/ACT inhaler Inhale 1-2 puffs into the lungs every 6 (six) hours as needed for wheezing or shortness of breath. 12/03/16  Yes Kinnie Feil, PA-C  allopurinol (ZYLOPRIM) 300 MG tablet Take 300 mg by mouth daily.    Yes Historical Provider, MD  atorvastatin (LIPITOR) 40 MG tablet Take 40 mg by mouth at bedtime.  01/21/16  Yes Historical Provider, MD  BAYER BREEZE 2 TEST DISK Apply 1 each topically daily as needed (FOR TESTING BLOOD SUGAR).  11/13/15  Yes Historical  Provider, MD  benzonatate (TESSALON PERLES) 100 MG capsule Take 1 capsule (100 mg total) by mouth 3 (three) times daily as needed for cough. 12/03/16  Yes Kinnie Feil, PA-C  calcitRIOL (ROCALTROL) 0.5 MCG capsule Take 0.5 mcg by mouth Every Tuesday,Thursday,and Saturday with dialysis. Medication given at dialysis on Tues Thurs Sat 11/13/15  Yes Historical Provider, MD  carvedilol (COREG) 3.125 MG tablet Take 1 tablet (3.125 mg total) by mouth See admin instructions. Take 1 tablet (3.125 mg) by mouth twice daily on Monday, Wednesday, Friday and Sunday (non-dialysis days) 08/14/16  Yes Alvia Grove, PA-C  cyclobenzaprine (FLEXERIL) 5 MG tablet Take 5 mg by mouth 2 (two) times daily as needed for muscle spasms.    Yes Historical Provider, MD  famotidine (PEPCID) 20 MG tablet Take 1 tablet (20 mg total) by mouth 2 (two) times daily as needed (sour stomach). 08/14/16  Yes Kimberly A Trinh, PA-C  fluticasone (FLONASE) 50 MCG/ACT nasal spray Place 2 sprays into both nostrils daily as needed for allergies or rhinitis.    Yes Historical Provider, MD  glipiZIDE (GLUCOTROL) 5 MG tablet Take 0.5 tablets (2.5 mg total) by mouth 2 (two) times daily. 08/14/16  Yes Kimberly A Trinh, PA-C  Loratadine-Pseudoephedrine (CLARITIN-D 24 HOUR PO) Take 1 tablet by mouth daily. PATIENT NOT SURE OF THE DOSAGE   Yes Historical Provider, MD  multivitamin (RENA-VIT) TABS tablet Take 1 tablet by mouth at bedtime.  12/20/15  Yes Historical Provider, MD  oseltamivir (TAMIFLU) 30 MG capsule Take 1 capsule (30 mg total) by mouth daily. Take 30 mg after every hemodialysis session for 5 days total 12/03/16  Yes Kinnie Feil, PA-C  oxyCODONE-acetaminophen (PERCOCET) 7.5-325 MG tablet Take 1 tablet by mouth 3 (three) times daily as needed (pain). 08/14/16  Yes Alvia Grove, PA-C  sevelamer carbonate (RENVELA) 800 MG tablet Take 2 tablets (1,600 mg total) by mouth 3 (three) times daily with meals. 12/18/15  Yes Geradine Girt, DO       Social History   Social History  . Marital status: Married    Spouse name: N/A  . Number of children: N/A  . Years of education: N/A   Occupational History  . Retired    Social History Main Topics  . Smoking status: Former Research scientist (life sciences)  . Smokeless tobacco: Never Used  . Alcohol use No     Comment: Used to drink on the weekends, quit 2  years ago  . Drug use: No  . Sexual activity: No   Other Topics Concern  . Not on file   Social History Narrative   Lives with wife and daughter.    Family Status  Relation Status  . Mother Deceased  . Father Deceased  . Sister   . Brother   . Daughter   . Paternal Uncle    Family History  Problem Relation Age of Onset  . Arthritis Mother   . Cancer Father   . Heart disease Father   . Hypertension Sister   . Alcohol abuse Brother   . Diabetes Daughter   . Heart attack Paternal Uncle     ROS:  Full 14 point review of systems complete and found to be negative unless listed above.  Physical Exam: Blood pressure 137/74, pulse 88, temperature 98.8 F (37.1 C), temperature source Oral, resp. rate 18, height 5\' 11"  (1.803 m), weight 198 lb 6.6 oz (90 kg), SpO2 96 %.  General: Well developed, well nourished, male in no acute distress , chronically ill appearing.  Head: Eyes PERRLA, No xanthomas.   Normocephalic and atraumatic, oropharynx without edema or exudate.  Lungs: CTAB Heart: HRRR S1 S2, no rub/gallop, Heart regular rate and rhythm with S1, S2 no murmur. pulses are 2+ extrem.   Neck: No carotid bruits. No lymphadenopathy. no JVD. Abdomen: Bowel sounds present, abdomen soft and non-tender without masses or hernias noted. Msk:  No spine or cva tenderness. No weakness, no joint deformities or effusions. Extremities: No clubbing or cyanosis. 1+ bilateral edema.  Neuro: Alert and oriented X 3. No focal deficits noted. Psych:  Good affect, responds appropriately Skin: No rashes or lesions noted.  Labs:   Lab Results  Component  Value Date   WBC 7.8 12/12/2016   HGB 9.2 (L) 12/12/2016   HCT 28.0 (L) 12/12/2016   MCV 88.1 12/12/2016   PLT 159 12/12/2016   No results for input(s): INR in the last 72 hours.  Recent Labs Lab 12/13/16 0201  NA 136  K 4.2  CL 100*  CO2 24  BUN 14  CREATININE 2.60*  CALCIUM 7.6*  GLUCOSE 73  ALBUMIN 3.0*   Magnesium  Date Value Ref Range Status  12/13/2016 1.8 1.7 - 2.4 mg/dL Final     Lab Results  Component Value Date   CHOL 91 02/14/2016   HDL 37 (L) 02/14/2016   LDLCALC 45 02/14/2016   TRIG 46 02/14/2016   No results found for: DDIMER Lipase  Date/Time Value Ref Range Status  12/16/2015 05:00 PM 45 11 - 51 U/L Final    Echo: 12/11/2016 LV EF: 20% -   25% Study Conclusions - Left ventricle: The cavity size was mildly dilated. Wall   thickness was increased in a pattern of mild LVH. Systolic   function was severely reduced. The estimated ejection fraction   was in the range of 20% to 25%. Diffuse hypokinesis. Features are   consistent with a pseudonormal left ventricular filling pattern,   with concomitant abnormal relaxation and increased filling   pressure (grade 2 diastolic dysfunction). - Mitral valve: Calcified annulus. There was mild regurgitation. - Left atrium: The atrium was severely dilated. - Right ventricle: The cavity size was mildly dilated. Systolic   function was mildly reduced. - Right atrium: The atrium was mildly dilated. - Pulmonary arteries: Systolic pressure was moderately increased.   PA peak pressure: 56 mm Hg (S). - Pericardium, extracardiac: A trivial pericardial effusion was  identified. Impressions: - Severe global reduction in LV systolic function; grade 2   diastolic dysfunction; mild LVE; mild LVH; mild MR; severe LAE;   mild RVE with mildly reduced function; mild RAE; mild TR with   moderately elevated pulmonary pressure.   ECG:  Sinus HR 82, frequent PVCs.   Radiology:  No results found.  ASSESSMENT AND PLAN:     Principal Problem:   Fluid overload Active Problems:   H/O: gout   ESRD on hemodialysis ([name])   Dyslipidemia associated with type 2 diabetes mellitus ([name])   Acute on chronic systolic and diastolic heart failure, NYHA class 1 ([name])   Diabetes mellitus with diabetic nephropathy without long-term current use of insulin ([name])  YAHWEH POLLOK is a 71 y.o. male with a history of HTN, DMT2, obesity, DVT and ESRD on HD (TTS), HLD, bladder cancer, and NICM (EF 35-40%) who presented to [facility] on 12[facility]09[facility]16 with worsening LE edema and dyspnea in the setting of missing dialysis. 2D ECHO showed worsening of LVEF and cardiology consulted.   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 IV lasix and HD per nephrology  Nonischemic cardiomyopathy:  EF 35-40% by echo 11/2015 and 39% by Cardiolite 01/2016. Now 2D ECHO with EF down to 20-25%. LHC in 02/2016 with no CAD. Continue Coreg 3.125 mg BID. Would start on ARB and possibly transition to [facility] if his BP will tolerate. He does have frequent PCVs on ECG and tele and I wonder if that could be contributing to his worsening LV function. Consider outpatient holter monitor to quantify PVC burden and if appropriate start antiarrythmic therapy for suppression. MD to follow   CAD: No significant disease on 5/17 LHC.    DMT2: per primary team   ESRD on HD: nephrology following and getting back to back sessions   HTN: labile BPs and does have some hypotension with HD. This will limit heart failure therapy.    Signed: Angelena Form, PA-C 12/13/2016 1:17 PM  Pager [phone]  I have seen and examined this patient with Angelena Form.  Agree with above, note added to reflect my findings.  On exam, iRRR, no murmurs, lungs clear, significant JVD to 10 cm. Presented to the hospital from his orthopedic office with heart failure symptoms. Repeat echocardiogram was done that showed a decreased EF down to 25%, previously 35-40%.  He had missed  dialysis, and has since been dialyzed twice since he's been here. His weight has gone down by 6 pounds. Unfortunately, his blood pressure is been low with dialysis and he has not been able to tolerate optimal heart failure medications. He does take carvedilol 3.125 on nondialysis days. Telemetry shows 21% of PVCs. It is possible that this elevated PVC burden is causing his decreased ejection fraction from prior. We'll start him on amiodarone today. Once he is fully loaded, he may benefit from Holter monitoring to further determine his PVC burden.  Will M. Camnitz MD 12/13/2016 2:43 PM

## 2016-12-14 LAB — BASIC METABOLIC PANEL
Anion gap: 8 (ref 5–15)
BUN: 25 mg/dL — ABNORMAL HIGH (ref 6–20)
CO2: 29 mmol/L (ref 22–32)
CREATININE: 4.93 mg/dL — AB (ref 0.61–1.24)
Calcium: 8.3 mg/dL — ABNORMAL LOW (ref 8.9–10.3)
Chloride: 97 mmol/L — ABNORMAL LOW (ref 101–111)
GFR calc Af Amer: 12 mL/min — ABNORMAL LOW (ref >60)
GFR, EST NON AFRICAN AMERICAN: 11 mL/min — AB (ref >60)
GLUCOSE: 184 mg/dL — AB (ref 65–99)
POTASSIUM: 4.4 mmol/L (ref 3.5–5.1)
Sodium: 134 mmol/L — ABNORMAL LOW (ref 135–145)

## 2016-12-14 LAB — GLUCOSE, CAPILLARY
GLUCOSE-CAPILLARY: 189 mg/dL — AB (ref 65–99)
GLUCOSE-CAPILLARY: 125 mg/dL — AB (ref 65–99)
Glucose-Capillary: 193 mg/dL — ABNORMAL HIGH (ref 65–99)
Glucose-Capillary: 131 mg/dL — ABNORMAL HIGH (ref 65–99)

## 2016-12-14 MED ORDER — BISACODYL 10 MG RE SUPP
10.0000 mg | Freq: Once | RECTAL | Status: DC
  Filled 2016-12-14: qty 1

## 2016-12-14 MED ORDER — OXYCODONE-ACETAMINOPHEN 7.5-325 MG PO TABS
1.0000 | ORAL_TABLET | ORAL | Status: DC | PRN
  Administered 2016-12-14 – 2016-12-18 (×15): 1 via ORAL
  Filled 2016-12-14 (×16): qty 1

## 2016-12-14 MED ORDER — MORPHINE SULFATE (PF) 2 MG/ML IV SOLN
1.0000 mg | INTRAVENOUS | Status: DC | PRN
  Filled 2016-12-14: qty 1

## 2016-12-14 MED ORDER — INSULIN GLARGINE 100 UNIT/ML ~~LOC~~ SOLN
5.0000 [IU] | Freq: Once | SUBCUTANEOUS | Status: DC
Start: 2016-12-14 — End: 2016-12-14
  Filled 2016-12-14: qty 0.05

## 2016-12-14 MED ORDER — BISACODYL 5 MG PO TBEC
10.0000 mg | DELAYED_RELEASE_TABLET | Freq: Every day | ORAL | Status: DC | PRN
  Administered 2016-12-14 – 2016-12-17 (×4): 10 mg via ORAL
  Filled 2016-12-14 (×5): qty 2

## 2016-12-14 MED ORDER — INSULIN GLARGINE 100 UNIT/ML ~~LOC~~ SOLN: 5.0000 [IU] | Freq: Every day | SUBCUTANEOUS | Status: DC

## 2016-12-14 MED ORDER — DARBEPOETIN ALFA 150 MCG/0.3ML IJ SOSY: 150.0000 ug | PREFILLED_SYRINGE | INTRAMUSCULAR | Status: DC

## 2016-12-14 NOTE — Care Management Important Message (Signed)
Important Message  Patient Details  Name: Albert Stanley MRN: [id number] Date of Birth: [birth year]   Medicare Important Message Given:  Yes    Zenon Mayo, RN 12/14/2016, 4:54 [place] Message  Patient Details  Name: Albert Stanley MRN: [id number] Date of Birth: January 08, 1946   Medicare Important Message Given:  Yes    Zenon Mayo, RN 12/14/2016, 4:54 PM

## 2016-12-14 NOTE — Progress Notes (Signed)
Pharmacist Heart Failure Core Measure Documentation  Assessment: Albert Stanley has an EF documented as 20-25% on 12/11/16 by 2D Echo  Rationale: Heart failure patients with left ventricular systolic dysfunction (LVSD) and an EF < 40% should be prescribed an angiotensin converting enzyme inhibitor (ACEI) or angiotensin receptor blocker (ARB) at discharge unless a contraindication is documented in the medical record.  This patient is not currently on an ACEI or ARB for HF.  This note is being placed in the record in order to provide documentation that a contraindication to the use of these agents is present for this encounter.  ACE Inhibitor or Angiotensin Receptor Blocker is contraindicated (specify all that apply)  []   ACEI allergy AND ARB allergy []   Angioedema []   Moderate or severe aortic stenosis []   Hyperkalemia [x]   Hypotension []   Renal artery stenosis [x]   Worsening renal function, preexisting renal disease or dysfunction   Norva Riffle 12/14/2016 11:29 AM

## 2016-12-14 NOTE — Progress Notes (Signed)
Inpatient Diabetes Program Recommendations  AACE/ADA: New Consensus Statement on Inpatient Glycemic Control (2015)  Target Ranges:  Prepandial:   less than 140 mg/dL      Peak postprandial:   less than 180 mg/dL (1-2 hours)      Critically ill patients:  140 - 180 mg/dL   Results for SYMIR, FLITTER (MRN [id number]) as of 12/14/2016 10:17  Ref. Range 12/13/2016 08:02 12/13/2016 12:19 12/13/2016 17:00 12/13/2016 21:00 12/14/2016 08:43  Glucose-Capillary Latest Ref Range: 65 - 99 mg/dL 93 159 (H) 114 (H) 230 (H) 193 (H)   Review of Glycemic Control  Diabetes history: DM2 Outpatient Diabetes medications: Glipizide 2.5 mg BID Current orders for Inpatient glycemic control: Novolog 0-9 units TID with meals  Inpatient Diabetes Program Recommendations: Correction (SSI): Please consider ordering Novolog bedtime correction scale.  Thanks, Barnie Alderman, RN, MSN, CDE Diabetes Coordinator Inpatient Diabetes Program [phone] (Team Pager from 8am to 5pm)

## 2016-12-14 NOTE — Progress Notes (Signed)
  [facility] Progress Note   Subjective: no SOB or cough, no CP, no c/o's other than penile pain and blood passing through foley drain lines.    Vitals:   12/13/16 2257 12/14/16 0500 12/14/16 0840 12/14/16 1301  BP: 113/84  104/62 122/85  Pulse:   80 85  Resp: 10  10 15   Temp:   98.5 F (36.9 C) 98.8 F (37.1 C)  TempSrc:   Oral Oral  SpO2:   100% 96%  Weight:  92 kg (202 lb 13.2 oz)    Height:        Inpatient medications: . allopurinol  300 mg Oral Daily  . amiodarone  400 mg Oral BID  . atorvastatin  40 mg Oral QHS  . calcitRIOL  1 mcg Oral Q T,Th,Sa-HD  . carvedilol  3.125 mg Oral 2 times per day on Sun Mon Wed Fri  . [START ON 12/19/2016] darbepoetin (ARANESP) injection - DIALYSIS  150 mcg Intravenous Q Sat-HD  . feeding supplement  1 Container Oral BID BM  . feeding supplement (PRO-STAT SUGAR FREE 64)  30 mL Oral BID  . insulin aspart  0-9 Units Subcutaneous TID WC  . loratadine  10 mg Oral Daily  . multivitamin  1 tablet Oral QHS  . mupirocin ointment  1 application Nasal BID  . senna-docusate  2 tablet Oral BID    acetaminophen **OR** acetaminophen, albuterol, benzonatate, bisacodyl, cyclobenzaprine, famotidine, fluticasone, guaiFENesin-dextromethorphan, ondansetron **OR** ondansetron (ZOFRAN) IV, oxyCODONE-acetaminophen  Exam: No distress, calm, up in chair NO jvd Chest clear bilat RRR  Abd obese, soft ntnd Ext bilat pretib edema 1-2+ NF, Ox 3 AVF  Dialysis: [facility] 4h 85min  400/800  95kg   2/2.25 bath  AVF LUA       Assessment: 1. Dyspnea - denies symptoms now, CXR was clear.  Pt denies he had SOB on presentation, just swelling in his legs.   2. ESRD HD TTS - nonadherence, misses typically one HD per wk 3. Hematuria - traumatic foley cath, also hx of bladder cancer. Urology seeing 4. Anemia of CKD / ABL - Hb down 12 > 9's, resumed esa 5. MBD - hold binder 6. Dm2 7. Volume - 3kg under dry wt  8. Combined S/D CHF - echo worse, EF  now 20-25%. No CAD by cath May '17.  Per cards cont coreg.   Plan - HD tomorrow, cont to lower vol as tolerated   Kelly Splinter MD [place] pager [phone]   12/14/2016, 1:14 PM    Recent Labs Lab 12/12/16 0401 12/13/16 0201 12/14/16 0255  NA 135 136 134*  K 3.0* 4.2 4.4  CL 94* 100* 97*  CO2 26 24 29   GLUCOSE 75 73 184*  BUN 33* 14 25*  CREATININE 4.42* 2.60* 4.93*  CALCIUM 7.8* 7.6* 8.3*  PHOS  --  1.8*  --     Recent Labs Lab 12/13/16 0201  ALBUMIN 3.0*    Recent Labs Lab 12/10/16 0515 12/11/16 0222 12/12/16 2110  WBC 8.1 9.6 7.8  HGB 12.3* 10.0* 9.2*  HCT 36.9* 29.7* 28.0*  MCV 88.9 87.6 88.1  PLT 187 171 159   Iron/TIBC/Ferritin/ %Sat    Component Value Date/Time   IRON 17 (L) 12/07/2015 2122   TIBC 213 (L) 12/07/2015 2122   FERRITIN 544 (H) 12/07/2015 2122   IRONPCTSAT 8 (L) 12/07/2015 2122   IRONPCTSAT 24 05/03/2006 1529

## 2016-12-14 NOTE — Progress Notes (Signed)
Physical Therapy Treatment Patient Details Name: Albert Stanley MRN: [id number] DOB: September 06, 1946 Today's Date: 12/14/2016    History of Present Illness Albert Stanley is a 71 y.o. male with known ESRD on HD TTS, DM type II with complications of nephropathy, HTN, admitted with worsening dyspnea, progressive LE swelling and poor oral intake. He missed Tuesday HD prior to admission.  He also developed bladder outlet obstruction with traumatic foley placement and now with continuous irrigation     PT Comments    Pt doing very well with mobility. Very close to baseline and only limited by his multiple lines and tubes. Feel he will be at his baseline at return home so do not feel he needs HHPT.   Follow Up Recommendations  No PT follow up     Equipment Recommendations  None recommended by PT    Recommendations for Other Services       Precautions / Restrictions Precautions Precautions: Fall Restrictions Weight Bearing Restrictions: No    Mobility  Bed Mobility                  Transfers                 General transfer comment: Pt standing up eating breakfast  Ambulation/Gait Ambulation/Gait assistance: Supervision Ambulation Distance (Feet): 250 Feet Assistive device:  (IV pole) Gait Pattern/deviations: Step-through pattern;Trendelenburg;Decreased stride length;Decreased stance time - left Gait velocity: decr Gait velocity interpretation: Below normal speed for age/gender General Gait Details: Pt with steady gait. Assist for multiple lines. Pt prefers to hold IV pole or touch countertops etc in room.    Stairs            Wheelchair Mobility    Modified Rankin (Stroke Patients Only)       Balance Overall balance assessment: Needs assistance Sitting-balance support: No upper extremity supported;Feet supported Sitting balance-Leahy Scale: Normal     Standing balance support: No upper extremity supported Standing balance-Leahy Scale:  Fair Standing balance comment: Pt able to reach down to floor and pick up phone without difficulty                    Cognition Arousal/Alertness: Awake/alert Behavior During Therapy: WFL for tasks assessed/performed Overall Cognitive Status: Within Functional Limits for tasks assessed                      Exercises      General Comments        Pertinent Vitals/Pain Pain Assessment: Faces Faces Pain Scale: Hurts little more Pain Location: foley catheter Pain Descriptors / Indicators: Discomfort Pain Intervention(s): Limited activity within patient's tolerance    Home Living                      Prior Function            PT Goals (current goals can now be found in the care plan section) Progress towards PT goals: Progressing toward goals    Frequency    Min 3X/week      PT Plan Discharge plan needs to be updated    Co-evaluation             End of Session   Activity Tolerance: Patient tolerated treatment well Patient left: with call bell/phone within reach;Other (comment) (Standing in front of recliner)   PT Visit Diagnosis: Other abnormalities of gait and mobility (R26.89)     Time: [id number] PT Time Calculation (  min) (ACUTE ONLY): 21 min  Charges:  $Gait Training: 8-22 mins                    G CodesShary Decamp [place]ok 12-28-16, 10:04 AM Suanne Marker PT [phone]

## 2016-12-14 NOTE — Progress Notes (Signed)
HPI: 71 yo M with PMH ESRD on HD TTS, HFrEF, T2DM, HTN, presented to [facility] ED 2/28 with CC dyspnea  AC: none d/t gross hematuria  ID:   CV: VSS - amio 400 BID, atorva, coreg  Endo: CBGs 90-187 on sSSI  GI/Nutr: senna-s  Neuro:   Renal: ESRD on HD TTS. Last HD 3/7. K 3.9, Phos 3.4, CorCa 8.7 Calcitriol Heme/Onc:  Aranesp 150 mcg IV qFri HD for anemia of ESRD  * Outpatient ESA/iron orders: Mircera 116mcg q2weeks (last 2/15) * No recent iron panel or iron supp * Last doses of Aranesp given on 3/2  * Hgb 8.5 on 3/7, next dose due 3/10  Pulm:   PTA med issues: glipizide, renvela 1600 TID WM Best practices:   Plan:  Holding binder d/t low phos Change allopurinol to 100mg  daily Iron panel Plan for HD daily to lower to new dry weight

## 2016-12-14 NOTE — Progress Notes (Signed)
PROGRESS NOTE    Albert Stanley  [id number] DOB: September 09, 1946 DOA: 12/09/2016 PCP: [name], MD    Brief Narrative: Albert Stanley is a 71 y.o. male with known ESRD on HD TTS, DM type II with complications of nephropathy, HTN, presented to [facility] ED with main concern of several days duration of progressively worsening dyspnea that initially started with exertion and has progressed to dyspnea at rest. He was found to be fluid overloaded, and was admitted for HD. He was also complaining of urinary retention and had traumatic foley catheter placement on 2/28 and had >500 ml of bloody urine. He continues to have intermittent bladder spasms. Urology consulted and was started on continuous bladder irrigation. Meanwhile he was started on HD, had two sessions so far.his repeat ECHO shows worsening of LV dysfunction from 40 to 20%. Cardiology consulted for recommendations.     Assessment & Plan:   Principal Problem:   Fluid overload Active Problems:   H/O: gout   ESRD on hemodialysis ([place])   Dyslipidemia associated with type 2 diabetes mellitus ([name])   Acute on chronic systolic and diastolic heart failure, NYHA class 1 ([name])   Diabetes mellitus with diabetic nephropathy without long-term current use of insulin ([name])   Mild acute on chronic systolic and diastolic heart failure with fluid overload: Probably from missing HD on 2/27.  Slight improvement with IV lasix. IV lasix stopped as HD resumed. 2 sessions of HD so far. He does not appear to be fluid overloaded.  Repeat echocardiogram ordered showed LV EF of 20 to 25%. Diffuse hypokinesis and abnormal relaxation and increased filling pressure, grade 2 diastolic dysfunction.  Worsened LV function when compared to the study in 11/2015. Cardiology consulted for recommendations. He was started on amiodarone in view of multiple PVC'S.  Daily weights and strict intake and output.    ESRD ON HD: as per renal.   Diabetes mellitus: with  nephropathy.  CBG (last 3)   Recent Labs  12/14/16 0843 12/14/16 1304 12/14/16 1638  GLUCAP 193* 131* 189*    Holding oral meds. Resume SSI.    Thrombocytopenia: monitor platelet count.    Urinary retention with traumatic foley placement with bloody urine , and associated bladder spasms.  Urology consulted , he was started on continuous bladder irrigation on 3/1 to 3/3, it was stopped on 3/3, today pt reports worsening pain in the lower abdomen and he was found to be have clots, bladder irrigation restarted by urology on 3/4. He continues to have bladder spasms, he will probably need cystoscopy for further recommendations.   ? Constipation: stool softeners ordered. Dulcolax added today.   DVT prophylaxis: scd's Code Status: (Full) Family Communication: none at bedside.  Disposition Plan: pending further eval.   Consultants:   Urology   Nephrology.    Procedures: HD.    Antimicrobials: None.   Subjective: Some abdominal cramps.   Objective: Vitals:   12/14/16 0500 12/14/16 0840 12/14/16 1301 12/14/16 1640  BP:  104/62 122/85 113/80  Pulse:  80 85   Resp:  10 15   Temp:  98.5 F (36.9 C) 98.8 F (37.1 C) 97.4 F (36.3 C)  TempSrc:  Oral Oral Oral  SpO2:  100% 96%   Weight: 92 kg (202 lb 13.2 oz)     Height:        Intake/Output Summary (Last 24 hours) at 12/14/16 1805 Last data filed at 12/14/16 1309  Gross per 24 hour  Intake  3240 ml  Output             6675 ml  Net            -3435 ml   Filed Weights   12/12/16 2100 12/13/16 0137 12/14/16 0500  Weight: 92.5 kg (203 lb 14.8 oz) 90 kg (198 lb 6.6 oz) 92 kg (202 lb 13.2 oz)    Examination:  General exam: Appears COMFORTABLE.  Respiratory system: Clear to auscultation. Respiratory effort normal. Cardiovascular system: S1 & S2 heard, RRR. No JVD, murmurs, rubs, gallops or clicks. No pedal edema. Gastrointestinal system: lower abd tenderness improved. , Normal bowel sounds  heard. Central nervous system: Alert and oriented. No focal neurological deficits. Extremities: Symmetric 5 x 5 power.     Data Reviewed: I have personally reviewed following labs and imaging studies  CBC:  Recent Labs Lab 12/09/16 1201 12/09/16 2145 12/10/16 0515 12/11/16 0222 12/12/16 2110  WBC 5.0 4.8 8.1 9.6 7.8  HGB 11.7* 13.3 12.3* 10.0* 9.2*  HCT 35.5* 36.0* 36.9* 29.7* 28.0*  MCV 88.5 89.1 88.9 87.6 88.1  PLT 146* 138* 187 171 [id number]   Basic Metabolic Panel:  Recent Labs Lab 12/10/16 0515 12/11/16 0222 12/12/16 0401 12/13/16 0201 12/13/16 1011 12/14/16 0255  NA 139 140 135 136  --  134*  K 4.2 4.7 3.0* 4.2  --  4.4  CL 101 102 94* 100*  --  97*  CO2 19* 21* 26 24  --  29  GLUCOSE 155* 135* 75 73  --  184*  BUN 80* 92* 33* 14  --  25*  CREATININE 8.25* 9.07* 4.42* 2.60*  --  4.93*  CALCIUM 8.6* 8.7* 7.8* 7.6*  --  8.3*  MG  --   --   --   --  1.8  --   PHOS  --   --   --  1.8*  --   --    GFR: Estimated Creatinine Clearance: 15.9 mL/min (by C-G formula based on SCr of 4.93 mg/dL (H)). Liver Function Tests:  Recent Labs Lab 12/13/16 0201  ALBUMIN 3.0*   No results for input(s): LIPASE, AMYLASE in the last 168 hours. No results for input(s): AMMONIA in the last 168 hours. Coagulation Profile: No results for input(s): INR, PROTIME in the last 168 hours. Cardiac Enzymes: No results for input(s): CKTOTAL, CKMB, CKMBINDEX, TROPONINI in the last 168 hours. BNP (last 3 results) No results for input(s): PROBNP in the last 8760 hours. HbA1C: No results for input(s): HGBA1C in the last 72 hours. CBG:  Recent Labs Lab 12/13/16 1700 12/13/16 2100 12/14/16 0843 12/14/16 1304 12/14/16 1638  GLUCAP 114* 230* 193* 131* 189*   Lipid Profile: No results for input(s): CHOL, HDL, LDLCALC, TRIG, CHOLHDL, LDLDIRECT in the last 72 hours. Thyroid Function Tests: No results for input(s): TSH, T4TOTAL, FREET4, T3FREE, THYROIDAB in the last 72 hours. Anemia  Panel: No results for input(s): VITAMINB12, FOLATE, FERRITIN, TIBC, IRON, RETICCTPCT in the last 72 hours. Sepsis Labs: No results for input(s): PROCALCITON, LATICACIDVEN in the last 168 hours.  Recent Results (from the past 240 hour(s))  MRSA PCR Screening     Status: Abnormal   Collection Time: 12/09/16  9:39 PM  Result Value Ref Range Status   MRSA by PCR POSITIVE (A) NEGATIVE Final    Comment:        The GeneXpert MRSA Assay (FDA approved for NASAL specimens only), is one component of a comprehensive MRSA colonization surveillance program. It is  not intended to diagnose MRSA infection nor to guide or monitor treatment for MRSA infections. RESULT CALLED TO, READ BACK BY AND VERIFIED WITH: E CASTRO,RN @0012  12/10/16 MKELLY,MLT          Radiology Studies: No results found.      Scheduled Meds: . allopurinol  300 mg Oral Daily  . amiodarone  400 mg Oral BID  . atorvastatin  40 mg Oral QHS  . calcitRIOL  1 mcg Oral Q T,Th,Sa-HD  . carvedilol  3.125 mg Oral 2 times per day on Sun Mon Wed Fri  . [START ON 12/19/2016] darbepoetin (ARANESP) injection - DIALYSIS  150 mcg Intravenous Q Sat-HD  . feeding supplement  1 Container Oral BID BM  . feeding supplement (PRO-STAT SUGAR FREE 64)  30 mL Oral BID  . insulin aspart  0-9 Units Subcutaneous TID WC  . loratadine  10 mg Oral Daily  . multivitamin  1 tablet Oral QHS  . mupirocin ointment  1 application Nasal BID  . senna-docusate  2 tablet Oral BID   Continuous Infusions:   LOS: 5 days    Time spent: 30 minutes.     Hosie Poisson, MD [facility] Pager [phone]  If 7PM-7AM, please contact night-coverage [email or web address] Password [facility] 12/14/2016, 6:05 PM

## 2016-12-15 DIAGNOSIS — D631 Anemia in chronic kidney disease: Secondary | ICD-10-CM

## 2016-12-15 DIAGNOSIS — I493 Ventricular premature depolarization: Secondary | ICD-10-CM

## 2016-12-15 DIAGNOSIS — N189 Chronic kidney disease, unspecified: Secondary | ICD-10-CM

## 2016-12-15 LAB — BASIC METABOLIC PANEL
ANION GAP: 11 (ref 5–15)
BUN: 33 mg/dL — AB (ref 6–20)
CHLORIDE: 94 mmol/L — AB (ref 101–111)
CO2: 26 mmol/L (ref 22–32)
Calcium: 8.3 mg/dL — ABNORMAL LOW (ref 8.9–10.3)
Creatinine, Ser: 6.24 mg/dL — ABNORMAL HIGH (ref 0.61–1.24)
GFR, EST AFRICAN AMERICAN: 9 mL/min — AB (ref >60)
GFR, EST NON AFRICAN AMERICAN: 8 mL/min — AB (ref >60)
Glucose, Bld: 129 mg/dL — ABNORMAL HIGH (ref 65–99)
POTASSIUM: 4 mmol/L (ref 3.5–5.1)
SODIUM: 131 mmol/L — AB (ref 135–145)

## 2016-12-15 LAB — CBC
HCT: 30.3 % — ABNORMAL LOW (ref 39.0–52.0)
HEMOGLOBIN: 9.9 g/dL — AB (ref 13.0–17.0)
MCH: 29.5 pg (ref 26.0–34.0)
MCHC: 32.7 g/dL (ref 30.0–36.0)
MCV: 90.2 fL (ref 78.0–100.0)
PLATELETS: 175 10*3/uL (ref 150–400)
RBC: 3.36 MIL/uL — AB (ref 4.22–5.81)
RDW: 16.3 % — ABNORMAL HIGH (ref 11.5–15.5)
WBC: 9.8 10*3/uL (ref 4.0–10.5)

## 2016-12-15 LAB — GLUCOSE, CAPILLARY
GLUCOSE-CAPILLARY: 96 mg/dL (ref 65–99)
Glucose-Capillary: 99 mg/dL (ref 65–99)
Glucose-Capillary: 179 mg/dL — ABNORMAL HIGH (ref 65–99)

## 2016-12-15 MED ORDER — CALCITRIOL 0.5 MCG PO CAPS
ORAL_CAPSULE | ORAL | Status: AC
  Administered 2016-12-15: 1 ug via ORAL
  Filled 2016-12-15: qty 2

## 2016-12-15 MED ORDER — FINASTERIDE 5 MG PO TABS
5.0000 mg | ORAL_TABLET | Freq: Every day | ORAL | Status: DC
  Administered 2016-12-16 – 2016-12-18 (×3): 5 mg via ORAL
  Filled 2016-12-15 (×3): qty 1

## 2016-12-15 NOTE — Progress Notes (Addendum)
[facility] Progress Note   Subjective: no SOB or cough, no CP, no c/o's other than penile pain and blood passing through foley drain lines.    Vitals:   12/15/16 0942 12/15/16 1000 12/15/16 1030 12/15/16 1100  BP: (!) 110/46 (!) 94/46 (!) 141/68 125/70  Pulse: 77 79 82 84  Resp:  (!) 24    Temp:      TempSrc:      SpO2: 98% 100% 100% 100%  Weight:      Height:        Inpatient medications: . allopurinol  300 mg Oral Daily  . amiodarone  400 mg Oral BID  . atorvastatin  40 mg Oral QHS  . bisacodyl  10 mg Rectal Once  . calcitRIOL  1 mcg Oral Q T,Th,Sa-HD  . carvedilol  3.125 mg Oral 2 times per day on Sun Mon Wed Fri  . [START ON 12/19/2016] darbepoetin (ARANESP) injection - DIALYSIS  150 mcg Intravenous Q Sat-HD  . feeding supplement  1 Container Oral BID BM  . feeding supplement (PRO-STAT SUGAR FREE 64)  30 mL Oral BID  . finasteride  5 mg Oral Daily  . insulin aspart  0-9 Units Subcutaneous TID WC  . loratadine  10 mg Oral Daily  . multivitamin  1 tablet Oral QHS  . senna-docusate  2 tablet Oral BID    acetaminophen **OR** acetaminophen, albuterol, benzonatate, bisacodyl, cyclobenzaprine, famotidine, fluticasone, guaiFENesin-dextromethorphan, morphine injection, ondansetron **OR** ondansetron (ZOFRAN) IV, oxyCODONE-acetaminophen  Exam: No distress, calm, up in chair NO jvd Chest clear bilat RRR  Abd obese, soft ntnd Ext bilat pretib edema 1-2+ NF, Ox 3 AVF  CXR 3/1 - vasc congestion, mild, no edema  Dialysis: [facility] 4h 9min  400/800  95kg   2/2.25 bath  AVF LUA   Assessment: 1. Dyspnea/ LE edema - got down to 90 kg here, but has gained back up to 95kg. Pull max UF with HD again today. Lower dry wt at dc.  Have d/w patient about fluid intake.  Plan daily HD for next 3 days, challenge volume.  2. ESRD HD TTS - nonadherence, talked w pts wife who says he doesn't miss HD unless he doesn't feel well and didn't think that he is missing as many as  one treatment per week.  3. Hematuria - traumatic foley cath, also hx of bladder cancer. Urology seeing 4. Anemia of CKD / ABL - Hb down 12 > 9's, resumed esa 5. MBD - hold binder 6. DM2 7. Combined S/D CHF - echo worse, EF now 20-25%. No CAD by cath May '17.  Per cards cont coreg.   Plan - HD today, tomorrow and thursday   Kelly Splinter MD [facility] pager [phone]   12/15/2016, 11:40 AM    Recent Labs Lab 12/13/16 0201 12/14/16 0255 12/15/16 0349  NA 136 134* 131*  K 4.2 4.4 4.0  CL 100* 97* 94*  CO2 24 29 26   GLUCOSE 73 184* 129*  BUN 14 25* 33*  CREATININE 2.60* 4.93* 6.24*  CALCIUM 7.6* 8.3* 8.3*  PHOS 1.8*  --   --     Recent Labs Lab 12/13/16 0201  ALBUMIN 3.0*    Recent Labs Lab 12/11/16 0222 12/12/16 2110 12/15/16 0349  WBC 9.6 7.8 9.8  HGB 10.0* 9.2* 9.9*  HCT 29.7* 28.0* 30.3*  MCV 87.6 88.1 90.2  PLT 171 159 175   Iron/TIBC/Ferritin/ %Sat    Component Value Date/Time   IRON 17 (  L) 12/07/2015 2122   TIBC 213 (L) 12/07/2015 2122   FERRITIN 544 (H) 12/07/2015 2122   IRONPCTSAT 8 (L) 12/07/2015 2122   IRONPCTSAT 24 05/03/2006 1529

## 2016-12-15 NOTE — Progress Notes (Signed)
PROGRESS NOTE    Albert Stanley  [id number] DOB: 1945/11/24 DOA: 12/09/2016 PCP: [name], MD    Brief Narrative: Albert Stanley is a 71 y.o. male with known ESRD on HD TTS, DM type II with complications of nephropathy, HTN, presented to [facility] ED with main concern of several days duration of progressively worsening dyspnea that initially started with exertion and has progressed to dyspnea at rest. He was found to be fluid overloaded, and was admitted for HD. He was also complaining of urinary retention and had traumatic foley catheter placement on 2/28 and had >500 ml of bloody urine. He continues to have intermittent bladder spasms. Urology consulted and was started on continuous bladder irrigation. Meanwhile he was started on HD, had two sessions so far.his repeat ECHO shows worsening of LV dysfunction from 40 to 20%. Cardiology consulted for recommendations.     Assessment & Plan:   Principal Problem:   Fluid overload Active Problems:   H/O: gout   ESRD on hemodialysis ([place])   Dyslipidemia associated with type 2 diabetes mellitus ([name])   Acute on chronic systolic and diastolic heart failure, NYHA class 1 ([name])   Diabetes mellitus with diabetic nephropathy without long-term current use of insulin ([name])   Mild acute on chronic systolic and diastolic heart failure with fluid overload: Probably from missing HD on 2/27.  Slight improvement with IV lasix. IV lasix stopped as HD resumed. Repeat echocardiogram ordered showed LV EF of 20 to 25%. Diffuse hypokinesis and abnormal relaxation and increased filling pressure, grade 2 diastolic dysfunction.  Worsened LV function when compared to the study in 11/2015. Cardiology consulted for recommendations. He was started on amiodarone in view of multiple PVC'S and PVC burden, plan for holter monitor on discharge. HF team following the patient.  Daily weights and strict intake and output.    ESRD ON HD: as per renal.   Diabetes  mellitus: with nephropathy.  CBG (last 3)   Recent Labs  12/14/16 1638 12/14/16 2122 12/15/16 0807  GLUCAP 189* 125* 96    Holding oral meds. Resume SSI.    Thrombocytopenia: monitor platelet count.    Urinary retention with traumatic foley placement with bloody urine, clots , and associated bladder spasms.  Urology consulted , he was started on continuous bladder irrigation on 3/1 to 3/3, it was stopped on 3/3, today pt reports worsening pain in the lower abdomen and he was found to be have clots, bladder irrigation restarted by urology on 3/4. He continues to have bladder spasms, he will probably need cystoscopy for further recommendations. Discussed with Dr Tresa Moore  With Urology , they will see the patient to see if he needs further evaluation other than CBI.   ? Constipation: stool softeners ordered. Dulcolax added today.   DVT prophylaxis: scd's Code Status: (Full) Family Communication: none at bedside.  Disposition Plan: pending further eval.   Consultants:   Urology   Nephrology.   CARDIOLOGY   Procedures: HD.   CBI.    Antimicrobials: None.   Subjective: Some abdominal cramps.   Objective: Vitals:   12/15/16 1200 12/15/16 1230 12/15/16 1309 12/15/16 1422  BP: 137/64 (!) 93/51 (!) 126/56 (!) 107/59  Pulse: 79 76 80 78  Resp:   15   Temp:   97.9 F (36.6 C) 98.1 F (36.7 C)  TempSrc:   Oral Axillary  SpO2: 97% 100% 100%   Weight:   92.7 kg (204 lb 5.9 oz)   Height:  Intake/Output Summary (Last 24 hours) at 12/15/16 1427 Last data filed at 12/15/16 1422  Gross per 24 hour  Intake              300 ml  Output             7900 ml  Net            -7600 ml   Filed Weights   12/15/16 0441 12/15/16 0918 12/15/16 1309  Weight: 94.8 kg (208 lb 15.9 oz) 95.2 kg (209 lb 14.1 oz) 92.7 kg (204 lb 5.9 oz)    Examination:  General exam: Appears COMFORTABLE.  Respiratory system: Clear to auscultation. Respiratory effort normal. Cardiovascular  system: S1 & S2 heard, RRR. No JVD, murmurs, rubs, gallops or clicks. No pedal edema. Gastrointestinal system: lower abd tenderness improved. , Normal bowel sounds heard. Central nervous system: Alert and oriented. No focal neurological deficits. Extremities: Symmetric 5 x 5 power.     Data Reviewed: I have personally reviewed following labs and imaging studies  CBC:  Recent Labs Lab 12/09/16 2145 12/10/16 0515 12/11/16 0222 12/12/16 2110 12/15/16 0349  WBC 4.8 8.1 9.6 7.8 9.8  HGB 13.3 12.3* 10.0* 9.2* 9.9*  HCT 36.0* 36.9* 29.7* 28.0* 30.3*  MCV 89.1 88.9 87.6 88.1 90.2  PLT 138* 187 171 159 0000000   Basic Metabolic Panel:  Recent Labs Lab 12/11/16 0222 12/12/16 0401 12/13/16 0201 12/13/16 1011 12/14/16 0255 12/15/16 0349  NA 140 135 136  --  134* 131*  K 4.7 3.0* 4.2  --  4.4 4.0  CL 102 94* 100*  --  97* 94*  CO2 21* 26 24  --  29 26  GLUCOSE 135* 75 73  --  184* 129*  BUN 92* 33* 14  --  25* 33*  CREATININE 9.07* 4.42* 2.60*  --  4.93* 6.24*  CALCIUM 8.7* 7.8* 7.6*  --  8.3* 8.3*  MG  --   --   --  1.8  --   --   PHOS  --   --  1.8*  --   --   --    GFR: Estimated Creatinine Clearance: 12.6 mL/min (by C-G formula based on SCr of 6.24 mg/dL (H)). Liver Function Tests:  Recent Labs Lab 12/13/16 0201  ALBUMIN 3.0*   No results for input(s): LIPASE, AMYLASE in the last 168 hours. No results for input(s): AMMONIA in the last 168 hours. Coagulation Profile: No results for input(s): INR, PROTIME in the last 168 hours. Cardiac Enzymes: No results for input(s): CKTOTAL, CKMB, CKMBINDEX, TROPONINI in the last 168 hours. BNP (last 3 results) No results for input(s): PROBNP in the last 8760 hours. HbA1C: No results for input(s): HGBA1C in the last 72 hours. CBG:  Recent Labs Lab 12/14/16 0843 12/14/16 1304 12/14/16 1638 12/14/16 2122 12/15/16 0807  GLUCAP 193* 131* 189* 125* 96   Lipid Profile: No results for input(s): CHOL, HDL, LDLCALC, TRIG,  CHOLHDL, LDLDIRECT in the last 72 hours. Thyroid Function Tests: No results for input(s): TSH, T4TOTAL, FREET4, T3FREE, THYROIDAB in the last 72 hours. Anemia Panel: No results for input(s): VITAMINB12, FOLATE, FERRITIN, TIBC, IRON, RETICCTPCT in the last 72 hours. Sepsis Labs: No results for input(s): PROCALCITON, LATICACIDVEN in the last 168 hours.  Recent Results (from the past 240 hour(s))  MRSA PCR Screening     Status: Abnormal   Collection Time: 12/09/16  9:39 PM  Result Value Ref Range Status   MRSA by PCR POSITIVE (A) NEGATIVE  Final    Comment:        The GeneXpert MRSA Assay (FDA approved for NASAL specimens only), is one component of a comprehensive MRSA colonization surveillance program. It is not intended to diagnose MRSA infection nor to guide or monitor treatment for MRSA infections. RESULT CALLED TO, READ BACK BY AND VERIFIED WITH: E CASTRO,RN @0012  12/10/16 MKELLY,MLT          Radiology Studies: No results found.      Scheduled Meds: . allopurinol  300 mg Oral Daily  . amiodarone  400 mg Oral BID  . atorvastatin  40 mg Oral QHS  . bisacodyl  10 mg Rectal Once  . calcitRIOL  1 mcg Oral Q T,Th,Sa-HD  . carvedilol  3.125 mg Oral 2 times per day on Sun Mon Wed Fri  . [START ON 12/19/2016] darbepoetin (ARANESP) injection - DIALYSIS  150 mcg Intravenous Q Sat-HD  . feeding supplement  1 Container Oral BID BM  . feeding supplement (PRO-STAT SUGAR FREE 64)  30 mL Oral BID  . finasteride  5 mg Oral Daily  . insulin aspart  0-9 Units Subcutaneous TID WC  . loratadine  10 mg Oral Daily  . multivitamin  1 tablet Oral QHS  . senna-docusate  2 tablet Oral BID   Continuous Infusions:   LOS: 6 days    Time spent: 30 minutes.     Hosie Poisson, MD [facility] Pager [phone]  If 7PM-7AM, please contact night-coverage [email or web address] Password [email or web address] 12/15/2016, 2:27 PM

## 2016-12-15 NOTE — Progress Notes (Signed)
Advanced Heart Failure Rounding Note   Subjective:   Albert Stanley is a 71 y.o. male with a history of HTN, right renal subcapsular hematoma, DMT2, obesity, DVT, ESRD on HD (TTS), HLD, bladder cancer, and NICM (EF 35-40%) who presented to [facility] on 12/09/16 with worsening LE edema and dyspnea in the setting of missing dialysis. 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   Complaining of leg edema. No CP.   LHC 02/2016 no CAD   Objective:   Weight Range:  Vital Signs:   Temp:  [97.4 F (36.3 C)-98.8 F (37.1 C)] 98.5 F (36.9 C) (03/06 0800) Pulse Rate:  [37-85] 52 (03/06 0400) Resp:  [12-27] 12 (03/06 0400) BP: (106-126)/(56-85) 126/59 (03/06 0400) SpO2:  [96 %-100 %] 100 % (03/06 0400) Weight:  [208 lb 15.9 oz (94.8 kg)] 208 lb 15.9 oz (94.8 kg) (03/06 0441) Last BM Date: 12/15/16  Weight change: Filed Weights   12/13/16 0137 12/14/16 0500 12/15/16 0441  Weight: 198 lb 6.6 oz (90 kg) 202 lb 13.2 oz (92 kg) 208 lb 15.9 oz (94.8 kg)    Intake/Output:   Intake/Output Summary (Last 24 hours) at 12/15/16 0944 Last data filed at 12/15/16 0700  Gross per 24 hour  Intake              240 ml  Output             4725 ml  Net            -4485 ml     Physical Exam: General:  Elderly. No resp difficulty HEENT: normal Neck: supple. JVP to jaw. Carotids 2+ bilat; no bruits. No lymphadenopathy or thryomegaly appreciated. Cor: PMI nondisplaced. Regular rate & rhythm. No rubs, gallops or murmurs. Lungs: clear Abdomen: soft, nontender, nondistended. No hepatosplenomegaly. No bruits or masses. Good bowel sounds. Extremities: no cyanosis, clubbing, rash, R and LLE 1-2+edema Neuro: alert & orientedx3, cranial nerves grossly intact. moves all 4 extremities w/o difficulty. Affect pleasant  Telemetry: Reviewed personally , NSR  80s with PVCs  Labs: Basic Metabolic Panel:  Recent Labs Lab 12/11/16 0222 12/12/16 0401 12/13/16 0201 12/13/16 1011 12/14/16 0255 12/15/16 0349  NA 140 135 136  --  134* 131*  K 4.7 3.0* 4.2  --  4.4 4.0  CL 102 94* 100*  --  97* 94*  CO2 21* 26 24  --  29 26  GLUCOSE 135* 75 73  --  184* 129*  BUN 92* 33* 14  --  25* 33*  CREATININE 9.07* 4.42* 2.60*  --  4.93* 6.24*  CALCIUM 8.7* 7.8* 7.6*  --  8.3* 8.3*  MG  --   --   --  1.8  --   --   PHOS  --   --  1.8*  --   --   --     Liver Function Tests:  Recent Labs Lab 12/13/16 0201  ALBUMIN 3.0*   No results for input(s): LIPASE, AMYLASE in the last 168 hours. No results for input(s): AMMONIA in the last 168 hours.  CBC:  Recent Labs Lab 12/09/16 2145 12/10/16 0515 12/11/16 0222 12/12/16 2110 12/15/16 0349  WBC 4.8 8.1 9.6 7.8 9.8  HGB 13.3 12.3* 10.0* 9.2* 9.9*  HCT 36.0* 36.9* 29.7* 28.0* 30.3*  MCV 89.1 88.9 87.6 88.1  90.2  PLT 138* 187 171 159 175    Cardiac Enzymes: No results for input(s): CKTOTAL, CKMB, CKMBINDEX, TROPONINI in the last 168 hours.  BNP: BNP (last 3 results)  Recent Labs  12/09/16 1237  BNP 3,698.3*    ProBNP (last 3 results) No results for input(s): PROBNP in the last 8760 hours.    Other results:  Imaging:  No results found.   Medications:     Scheduled Medications: . allopurinol  300 mg Oral Daily  . amiodarone  400 mg Oral BID  . atorvastatin  40 mg Oral QHS  . bisacodyl  10 mg Rectal Once  . calcitRIOL  1 mcg Oral Q T,Th,Sa-HD  . carvedilol  3.125 mg Oral 2 times per day on Sun Mon Wed Fri  . [START ON 12/19/2016] darbepoetin (ARANESP) injection - DIALYSIS  150 mcg Intravenous Q Sat-HD  . feeding supplement  1 Container Oral BID BM  . feeding supplement (PRO-STAT SUGAR FREE 64)  30 mL Oral BID  . finasteride  5 mg Oral Daily  . insulin aspart  0-9 Units Subcutaneous TID WC  . loratadine  10 mg Oral Daily  . multivitamin  1 tablet Oral QHS  .  senna-docusate  2 tablet Oral BID     Infusions:   PRN Medications:  acetaminophen **OR** acetaminophen, albuterol, benzonatate, bisacodyl, cyclobenzaprine, famotidine, fluticasone, guaiFENesin-dextromethorphan, morphine injection, ondansetron **OR** ondansetron (ZOFRAN) IV, oxyCODONE-acetaminophen   Assessment/Plan/Discussion:   1. Dyspnea- missed HD last week. Admitted with volume overload and dyspnea .  2 . ESRD on HD 3. A/C Systolic Heart Failure- NICM.  Had [facility] 02/2016 no CAD. ECHO completed this admit with worsening LVEF. Previously EF was 35-40%, now EF down to 20-25% which is possibly PVC-induced. On 3/4 he was started on amio 400 mg twice a day. Consider holter monitor when discharged to quantify PVC burden. Volume overload per nephrology.  Continue low dose carvedilol on non dialysis days. BP ok.  4. Hematuria- related to trauma associated with foley catheter. Receiving CBI. Urology managing.  5. DMII  6. Anemia- CKD 7. Hyponatremia.   Length of Stay: [place] NP-C  12/15/2016, 9:44 AM  Advanced Heart Failure Team Pager [phone] (M-F; 7a - 4p)  Please contact [facility] for night-coverage after hours (4p -7a ) and weekends on [email or web address]  Patient seen with NP, agree with the above note.  He was admitted with volume overload/dyspnea.  Has history of periodically missing HD sessions.  - Plan for HD today, tomorrow, and Thursday for volume removal.   21% PVCs noted on telemetry monitoring.  EF down from 35-40% to 20-25%.  No chest pain, cath in 5/17 without significant CAD.  Possible that worsening of EF is PVC-mediated.  Plan to load amiodarone then repeat holter to reassess PVC burden.   Has not tolerated cardiac meds well due to low BP at HD.  Only on Coreg 3.125 mg bid on non-HD days.  Continue.   Loralie Champagne 12/15/2016 1:17 PM

## 2016-12-15 NOTE — Progress Notes (Signed)
  Subjective: Patient reports mild suprapubic discomfort and passing clots around the catheter. CBI on slow drip  Objective: Vital signs in last 24 hours: Temp:  [97.4 F (36.3 C)-98.8 F (37.1 C)] 97.5 F (36.4 C) (03/06 0325) Pulse Rate:  [37-85] 52 (03/06 0400) Resp:  [10-27] 12 (03/06 0400) BP: (104-126)/(56-85) 126/59 (03/06 0400) SpO2:  [96 %-100 %] 100 % (03/06 0400) Weight:  [94.8 kg (208 lb 15.9 oz)] 94.8 kg (208 lb 15.9 oz) (03/06 0441)  Intake/Output from previous day: 03/05 0701 - 03/06 0700 In: 240 [P.O.:240] Out: [id number] [Urine:6725] Intake/Output this shift: No intake/output data recorded.  Physical Exam:  General:alert, cooperative and appears stated age GI: soft, non tender, normal bowel sounds, no palpable masses, no organomegaly, no inguinal hernia Male genitalia: no penile lesions or discharge no testicular masses no bladder distension noted Resp: normal percussion bilaterally  Lab Results:  Recent Labs  12/12/16 2110 12/15/16 0349  HGB 9.2* 9.9*  HCT 28.0* 30.3*   BMET  Recent Labs  12/14/16 0255 12/15/16 0349  NA 134* 131*  K 4.4 4.0  CL 97* 94*  CO2 29 26  GLUCOSE 184* 129*  BUN 25* 33*  CREATININE 4.93* 6.24*  CALCIUM 8.3* 8.3*   No results for input(s): LABPT, INR in the last 72 hours. No results for input(s): LABURIN in the last 72 hours. Results for orders placed or performed during the hospital encounter of 12/09/16  MRSA PCR Screening     Status: Abnormal   Collection Time: 12/09/16  9:39 PM  Result Value Ref Range Status   MRSA by PCR POSITIVE (A) NEGATIVE Final    Comment:        The GeneXpert MRSA Assay (FDA approved for NASAL specimens only), is one component of a comprehensive MRSA colonization surveillance program. It is not intended to diagnose MRSA infection nor to guide or monitor treatment for MRSA infections. RESULT CALLED TO, READ BACK BY AND VERIFIED WITH: E CASTRO,RN @0012  12/10/16 MKELLY,MLT      Studies/Results: No results found.  Assessment/Plan: 71yo with gross hematuria following traumatic foley 1. Will start finasteride 5mg  daily for presumed prostatic bleeding 2. Wean CBI to off   LOS: 6 days   Nicolette Bang 12/15/2016, 7:32 AM

## 2016-12-16 LAB — RENAL FUNCTION PANEL
ALBUMIN: 2.5 g/dL — AB (ref 3.5–5.0)
Anion gap: 13 (ref 5–15)
BUN: 27 mg/dL — AB (ref 6–20)
CALCIUM: 7.6 mg/dL — AB (ref 8.9–10.3)
CO2: 25 mmol/L (ref 22–32)
CREATININE: 5.32 mg/dL — AB (ref 0.61–1.24)
Chloride: 93 mmol/L — ABNORMAL LOW (ref 101–111)
GFR calc Af Amer: 11 mL/min — ABNORMAL LOW (ref >60)
GFR calc non Af Amer: 10 mL/min — ABNORMAL LOW (ref >60)
GLUCOSE: 187 mg/dL — AB (ref 65–99)
PHOSPHORUS: 4.8 mg/dL — AB (ref 2.5–4.6)
POTASSIUM: 3.5 mmol/L (ref 3.5–5.1)
SODIUM: 131 mmol/L — AB (ref 135–145)

## 2016-12-16 LAB — CBC
HEMATOCRIT: 26 % — AB (ref 39.0–52.0)
Hemoglobin: 8.5 g/dL — ABNORMAL LOW (ref 13.0–17.0)
MCH: 29.4 pg (ref 26.0–34.0)
MCHC: 32.7 g/dL (ref 30.0–36.0)
MCV: 90 fL (ref 78.0–100.0)
PLATELETS: 157 10*3/uL (ref 150–400)
RBC: 2.89 MIL/uL — ABNORMAL LOW (ref 4.22–5.81)
RDW: 16.2 % — ABNORMAL HIGH (ref 11.5–15.5)
WBC: 9 10*3/uL (ref 4.0–10.5)

## 2016-12-16 LAB — GLUCOSE, CAPILLARY
GLUCOSE-CAPILLARY: 224 mg/dL — AB (ref 65–99)
Glucose-Capillary: 133 mg/dL — ABNORMAL HIGH (ref 65–99)
Glucose-Capillary: 90 mg/dL (ref 65–99)
Glucose-Capillary: 142 mg/dL — ABNORMAL HIGH (ref 65–99)

## 2016-12-16 NOTE — Progress Notes (Signed)
  [facility] Progress Note   Subjective: no c/o's, on HD    Vitals:   12/16/16 1100 12/16/16 1130 12/16/16 1136 12/16/16 1216  BP: 122/64 104/61 124/72 114/63  Pulse: 76 80 77 81  Resp: 20 (!) 21 15 15   Temp:   97.6 F (36.4 C) 97.9 F (36.6 C)  TempSrc:   Oral Oral  SpO2: 97% 100% 100% 100%  Weight:   88 kg (194 lb 0.1 oz)   Height:        Inpatient medications: . allopurinol  300 mg Oral Daily  . amiodarone  400 mg Oral BID  . atorvastatin  40 mg Oral QHS  . bisacodyl  10 mg Rectal Once  . calcitRIOL  1 mcg Oral Q T,Th,Sa-HD  . carvedilol  3.125 mg Oral 2 times per day on Sun Mon Wed Fri  . [START ON 12/19/2016] darbepoetin (ARANESP) injection - DIALYSIS  150 mcg Intravenous Q Sat-HD  . feeding supplement  1 Container Oral BID BM  . feeding supplement (PRO-STAT SUGAR FREE 64)  30 mL Oral BID  . finasteride  5 mg Oral Daily  . insulin aspart  0-9 Units Subcutaneous TID WC  . loratadine  10 mg Oral Daily  . multivitamin  1 tablet Oral QHS  . senna-docusate  2 tablet Oral BID    acetaminophen **OR** acetaminophen, albuterol, benzonatate, bisacodyl, cyclobenzaprine, famotidine, fluticasone, guaiFENesin-dextromethorphan, morphine injection, ondansetron **OR** ondansetron (ZOFRAN) IV, oxyCODONE-acetaminophen  Exam: No distress, calm, up in chair NO jvd Chest clear bilat RRR  Abd obese, soft ntnd Ext bilat pretib edema 1-2+ NF, Ox 3 AVF  CXR 3/1 - vasc congestion, mild, no edema  Dialysis: [facility] 4h 24min  400/800  95kg   2/2.25 bath  AVF LUA   Assessment: 1. Dyspnea/ LE edema/vol overload - significant lean body wt loss.  Daily HD , lowering to new dry wt.  2. ESRD HD TTS 3. Combined S/D CHF - echo worse, EF now 20-25%. No CAD by cath May '17.  Per cards cont coreg.  4. Hematuria - traumatic foley cath, getting CBI irrigation, lightening up 5. Anemia of CKD / ABL - Hb down 12 > 9's, resumed esa 6. MBD - hold binder 7. DM2  Plan - extra HD  today, HD tomorrow, cont lower volume as tolerated   Kelly Splinter MD [facility] pager [phone]   12/16/2016, 12:35 PM    Recent Labs Lab 12/13/16 0201 12/14/16 0255 12/15/16 0349 12/16/16 0722  NA 136 134* 131* 131*  K 4.2 4.4 4.0 3.5  CL 100* 97* 94* 93*  CO2 24 29 26 25   GLUCOSE 73 184* 129* 187*  BUN 14 25* 33* 27*  CREATININE 2.60* 4.93* 6.24* 5.32*  CALCIUM 7.6* 8.3* 8.3* 7.6*  PHOS 1.8*  --   --  4.8*    Recent Labs Lab 12/13/16 0201 12/16/16 0722  ALBUMIN 3.0* 2.5*    Recent Labs Lab 12/12/16 2110 12/15/16 0349 12/16/16 0722  WBC 7.8 9.8 9.0  HGB 9.2* 9.9* 8.5*  HCT 28.0* 30.3* 26.0*  MCV 88.1 90.2 90.0  PLT 159 175 157   Iron/TIBC/Ferritin/ %Sat    Component Value Date/Time   IRON 17 (L) 12/07/2015 2122   TIBC 213 (L) 12/07/2015 2122   FERRITIN 544 (H) 12/07/2015 2122   IRONPCTSAT 8 (L) 12/07/2015 2122   IRONPCTSAT 24 05/03/2006 1529

## 2016-12-16 NOTE — Progress Notes (Signed)
PROGRESS NOTE    Albert Stanley  MPN:[id number] DOB: 1946-03-12 DOA: 12/09/2016 PCP: [name], MD    Brief Narrative: Albert Stanley is a 71 y.o. male with known ESRD on HD TTS, DM type II with complications of nephropathy, HTN, presented to [facility] ED with main concern of several days duration of progressively worsening dyspnea that initially started with exertion and has progressed to dyspnea at rest. He was found to be fluid overloaded, and was admitted for HD. He was also complaining of urinary retention and had traumatic foley catheter placement on 2/28 and had >500 ml of bloody urine. He continues to have intermittent bladder spasms. Urology consulted and was started on continuous bladder irrigation. Meanwhile he was started on HD. Repeat ECHO showed worsening of LV dysfunction from 40 to 20%, thought possibly due to frequent PVCs for which cardiology was consulted and has loaded amiodarone.   Assessment & Plan:   Principal Problem:   Fluid overload Active Problems:   H/O: gout   End-stage renal disease on hemodialysis ([name])   Dyslipidemia associated with type 2 diabetes mellitus ([name])   Acute on chronic systolic and diastolic heart failure, NYHA class 1 ([name])   Diabetes mellitus with diabetic nephropathy without long-term current use of insulin ([name])   Anemia associated with chronic renal failure  Acute on chronic systolic and diastolic heart failure with fluid overload: Probably from missing HD on 2/27. Slight improvement with IV lasix. IV lasix stopped as HD resumed. Repeat echocardiogram ordered showed LV EF of 20 to 25%. Diffuse hypokinesis and abnormal relaxation and increased filling pressure, grade 2 diastolic dysfunction.  Worsened LV function when compared to the study in 11/2015.  - Cardiology consulted for recommendations. He was started on amiodarone in view of multiple PVC'S and PVC burden, plan for holter monitor on discharge. HF team following the patient.  - Daily  weights and strict intake and output.   ESRD  - HD per renal: Planning to lower EDW prior to discharge based on lean weight loss.   Diabetes mellitus: with nephropathy.  - Holding oral meds. Resume SSI.   Thrombocytopenia: No bleeding noted. - monitor platelet count.   Urinary retention with traumatic foley placement with bloody urine, clots , and associated bladder spasms.  - Urology consulted , he was started on continuous bladder irrigation on 3/1 to 3/3, it was stopped on 3/3, today pt reports worsening pain in the lower abdomen and he was found to be have clots, bladder irrigation restarted by urology on 3/4, plan on 3/6 was to wean CBI to off - Finasteride started for presumed prostatic bleeding.   Constipation:  - stool softeners ordered. Dulcolax added today.   DVT prophylaxis: scd's Code Status: (Full) Family Communication: none at bedside.  Disposition Plan: Plan to DC in next 48-72 hours, once new EDW established. PT eval pending.   Consultants:  Urology  Nephrology Cardiology  Procedures:  HD  CBI   Antimicrobials:  None.  Subjective: Denies any chest pain, dyspnea, palpitations.   Objective: Vitals:   12/16/16 1100 12/16/16 1130 12/16/16 1136 12/16/16 1216  BP: 122/64 104/61 124/72 114/63  Pulse: 76 80 77 81  Resp: 20 (!) 21 15 15   Temp:   97.6 F (36.4 C) 97.9 F (36.6 C)  TempSrc:   Oral Oral  SpO2: 97% 100% 100% 100%  Weight:   88 kg (194 lb 0.1 oz)   Height:        Intake/Output Summary (Last 24  hours) at 12/16/16 1319 Last data filed at 12/16/16 1216  Gross per 24 hour  Intake             6180 ml  Output            11881 ml  Net            -5701 ml   Filed Weights   12/16/16 0500 12/16/16 0703 12/16/16 1136  Weight: 88.7 kg (195 lb 8.8 oz) 91.4 kg (201 lb 8 oz) 88 kg (194 lb 0.1 oz)    Examination:  General exam: 71yo M in no distress  Respiratory system: Clear to auscultation. Respiratory effort normal. Cardiovascular system: S1  & S2 heard, RRR. No JVD, murmurs, rubs, gallops or clicks. 1+ bilateral LE edema Gastrointestinal system: lower abd tenderness improved. Normal bowel sounds heard. Central nervous system: Alert and oriented. No focal neurological deficits. Extremities: +thrill on AVF.  Data Reviewed: I have personally reviewed following labs and imaging studies  CBC:  Recent Labs Lab 12/10/16 0515 12/11/16 0222 12/12/16 2110 12/15/16 0349 12/16/16 0722  WBC 8.1 9.6 7.8 9.8 9.0  HGB 12.3* 10.0* 9.2* 9.9* 8.5*  HCT 36.9* 29.7* 28.0* 30.3* 26.0*  MCV 88.9 87.6 88.1 90.2 90.0  PLT 187 171 159 175 497   Basic Metabolic Panel:  Recent Labs Lab 12/12/16 0401 12/13/16 0201 12/13/16 1011 12/14/16 0255 12/15/16 0349 12/16/16 0722  NA 135 136  --  134* 131* 131*  K 3.0* 4.2  --  4.4 4.0 3.5  CL 94* 100*  --  97* 94* 93*  CO2 26 24  --  29 26 25   GLUCOSE 75 73  --  184* 129* 187*  BUN 33* 14  --  25* 33* 27*  CREATININE 4.42* 2.60*  --  4.93* 6.24* 5.32*  CALCIUM 7.8* 7.6*  --  8.3* 8.3* 7.6*  MG  --   --  1.8  --   --   --   PHOS  --  1.8*  --   --   --  4.8*   GFR: Estimated Creatinine Clearance: 13.6 mL/min (by C-G formula based on SCr of 5.32 mg/dL (H)). Liver Function Tests:  Recent Labs Lab 12/13/16 0201 12/16/16 0722  ALBUMIN 3.0* 2.5*   No results for input(s): LIPASE, AMYLASE in the last 168 hours. No results for input(s): AMMONIA in the last 168 hours. Coagulation Profile: No results for input(s): INR, PROTIME in the last 168 hours. Cardiac Enzymes: No results for input(s): CKTOTAL, CKMB, CKMBINDEX, TROPONINI in the last 168 hours. BNP (last 3 results) No results for input(s): PROBNP in the last 8760 hours. HbA1C: No results for input(s): HGBA1C in the last 72 hours. CBG:  Recent Labs Lab 12/15/16 0807 12/15/16 1419 12/15/16 1543 12/15/16 2100 12/16/16 1215  GLUCAP 96 133* 99 179* 90   Lipid Profile: No results for input(s): CHOL, HDL, LDLCALC, TRIG, CHOLHDL,  LDLDIRECT in the last 72 hours. Thyroid Function Tests: No results for input(s): TSH, T4TOTAL, FREET4, T3FREE, THYROIDAB in the last 72 hours. Anemia Panel: No results for input(s): VITAMINB12, FOLATE, FERRITIN, TIBC, IRON, RETICCTPCT in the last 72 hours. Sepsis Labs: No results for input(s): PROCALCITON, LATICACIDVEN in the last 168 hours.  Recent Results (from the past 240 hour(s))  MRSA PCR Screening     Status: Abnormal   Collection Time: 12/09/16  9:39 PM  Result Value Ref Range Status   MRSA by PCR POSITIVE (A) NEGATIVE Final    Comment:  The GeneXpert MRSA Assay (FDA approved for NASAL specimens only), is one component of a comprehensive MRSA colonization surveillance program. It is not intended to diagnose MRSA infection nor to guide or monitor treatment for MRSA infections. RESULT CALLED TO, READ BACK BY AND VERIFIED WITH: E CASTRO,RN @0012  12/10/16 MKELLY,MLT     Radiology Studies: No results found.  Scheduled Meds: . allopurinol  300 mg Oral Daily  . amiodarone  400 mg Oral BID  . atorvastatin  40 mg Oral QHS  . bisacodyl  10 mg Rectal Once  . calcitRIOL  1 mcg Oral Q T,Th,Sa-HD  . carvedilol  3.125 mg Oral 2 times per day on Sun Mon Wed Fri  . [START ON 12/19/2016] darbepoetin (ARANESP) injection - DIALYSIS  150 mcg Intravenous Q Sat-HD  . feeding supplement  1 Container Oral BID BM  . feeding supplement (PRO-STAT SUGAR FREE 64)  30 mL Oral BID  . finasteride  5 mg Oral Daily  . insulin aspart  0-9 Units Subcutaneous TID WC  . loratadine  10 mg Oral Daily  . multivitamin  1 tablet Oral QHS  . senna-docusate  2 tablet Oral BID   Continuous Infusions:   LOS: 7 days   Time spent: 25 minutes.   Vance Gather, MD [facility] Pager [phone]   If 7PM-7AM, please contact night-coverage [email or web address] Password [email or web address] 12/16/2016, 1:19 PM

## 2016-12-16 NOTE — Progress Notes (Signed)
PT Cancellation Note  Patient Details Name: Albert Stanley MRN: [id number] DOB: 1945-10-31   Cancelled Treatment:    Reason Eval/Treat Not Completed: Patient at procedure or test/unavailable. Pt off of the floor for dialysis this AM. PT will continue to f/u with pt as appropriate and available.    [place] 12/16/2016, 7:55 AM

## 2016-12-16 NOTE — Progress Notes (Signed)
Advanced Heart Failure Rounding Note   Subjective:   Albert Stanley is a 71 y.o. male with a history of HTN, right renal subcapsular hematoma, DMT2, obesity, DVT, ESRD on HD (TTS), HLD, bladder cancer, and NICM (EF 35-40%) who presented to [facility] on 12/09/16 with worsening LE edema and dyspnea in the setting of missing dialysis. 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   Feeling better this am. Legs remain swollen but improving. Denies lightheadedness or dizziness. Denies SOB.   LHC 02/2016 no CAD   Objective:   Weight Range:  Vital Signs:   Temp:  [97.9 F (36.6 C)-99.1 F (37.3 C)] 98.1 F (36.7 C) (03/07 0703) Pulse Rate:  [45-114] 75 (03/07 0830) Resp:  [12-25] 25 (03/07 0830) BP: (81-141)/(46-86) 121/76 (03/07 0830) SpO2:  [96 %-100 %] 100 % (03/07 0703) Weight:  [195 lb 8.8 oz (88.7 kg)-209 lb 14.1 oz (95.2 kg)] 201 lb 8 oz (91.4 kg) (03/07 0703) Last BM Date: 12/15/16  Weight change: Filed Weights   12/15/16 1309 12/16/16 0500 12/16/16 0703  Weight: 204 lb 5.9 oz (92.7 kg) 195 lb 8.8 oz (88.7 kg) 201 lb 8 oz (91.4 kg)    Intake/Output:   Intake/Output Summary (Last 24 hours) at 12/16/16 0848 Last data filed at 12/16/16 0630  Gross per 24 hour  Intake             6240 ml  Output            10550 ml  Net            -4310 ml     Physical Exam: General:  Elderly. Fatigued appearing. NAD. Undergoing HD HEENT: Normal Neck: supple. JVP 9-10 cm. Carotids 2+ bilat; no bruits. No thyromegaly or nodule noted.  Cor: PMI nondisplaced. Regular with ectopy. No M/G/R noted.  Lungs: Clear anteriorly (in HD) Abdomen: soft, NT, ND, no HSM. No bruits or masses. +BS  Extremities: no cyanosis, clubbing, rash, BLE 1+ edema.  Neuro: alert & orientedx3, cranial nerves grossly intact. moves all 4 extremities w/o difficulty.  Affect pleasant  Telemetry: Personally reviewed, NSR 70-80s with frequent PVCs up to bigeminy.   Labs: Basic Metabolic Panel:  Recent Labs Lab 12/12/16 0401 12/13/16 0201 12/13/16 1011 12/14/16 0255 12/15/16 0349 12/16/16 0722  NA 135 136  --  134* 131* 131*  K 3.0* 4.2  --  4.4 4.0 3.5  CL 94* 100*  --  97* 94* 93*  CO2 26 24  --  29 26 25   GLUCOSE 75 73  --  184* 129* 187*  BUN 33* 14  --  25* 33* 27*  CREATININE 4.42* 2.60*  --  4.93* 6.24* 5.32*  CALCIUM 7.8* 7.6*  --  8.3* 8.3* 7.6*  MG  --   --  1.8  --   --   --   PHOS  --  1.8*  --   --   --  4.8*    Liver Function Tests:  Recent Labs Lab 12/13/16 0201 12/16/16 0722  ALBUMIN 3.0* 2.5*   No results for input(s): LIPASE, AMYLASE in the last 168 hours. No results for input(s): AMMONIA in the last 168 hours.  CBC:  Recent Labs Lab 12/10/16 0515 12/11/16 0222 12/12/16 2110 12/15/16 0349 12/16/16 0722  WBC 8.1 9.6 7.8 9.8 9.0  HGB 12.3* 10.0* 9.2* 9.9* 8.5*  HCT 36.9* 29.7* 28.0* 30.3* 26.0*  MCV 88.9 87.6 88.1 90.2 90.0  PLT 187 171 159 175 157    Cardiac Enzymes: No results for input(s): CKTOTAL, CKMB, CKMBINDEX, TROPONINI in the last 168 hours.  BNP: BNP (last 3 results)  Recent Labs  12/09/16 1237  BNP 3,698.3*    ProBNP (last 3 results) No results for input(s): PROBNP in the last 8760 hours.    Other results:  Imaging: No results found.   Medications:     Scheduled Medications: . allopurinol  300 mg Oral Daily  . amiodarone  400 mg Oral BID  . atorvastatin  40 mg Oral QHS  . bisacodyl  10 mg Rectal Once  . calcitRIOL  1 mcg Oral Q T,Th,Sa-HD  . carvedilol  3.125 mg Oral 2 times per day on Sun Mon Wed Fri  . [START ON 12/19/2016] darbepoetin (ARANESP) injection - DIALYSIS  150 mcg Intravenous Q Sat-HD  . feeding supplement  1 Container Oral BID BM  . feeding supplement (PRO-STAT SUGAR FREE 64)  30 mL Oral BID  . finasteride  5 mg Oral Daily  . insulin aspart  0-9 Units  Subcutaneous TID WC  . loratadine  10 mg Oral Daily  . multivitamin  1 tablet Oral QHS  . senna-docusate  2 tablet Oral BID    Infusions:   PRN Medications: acetaminophen **OR** acetaminophen, albuterol, benzonatate, bisacodyl, cyclobenzaprine, famotidine, fluticasone, guaiFENesin-dextromethorphan, morphine injection, ondansetron **OR** ondansetron (ZOFRAN) IV, oxyCODONE-acetaminophen   Assessment/Plan/Discussion:   1. Dyspnea- missed HD last week. Admitted with volume overload and dyspnea .  2 . ESRD on HD 3. A/C Systolic Heart Failure- NICM.  Had [facility] 02/2016 no CAD. ECHO completed this admit with worsening LVEF. Previously EF was 35-40%, now EF down to 20-25% which is possibly PVC-induced.  - Continue amio 400 mg BID for now.  - Consider holter monitor as outpatient once loaded with amio to assess PVC burden.  - Volume status improving with dialysis.  - Continue coreg 3.125 mg BID. Hold on dialysis days.  - Reinforced fluid restriction to < 2 L daily, sodium restriction to less than 2000 mg daily, and the importance of daily weights.   4. Hematuria- related to trauma associated with foley catheter. Receiving CBI. Urology following.  5. DMII  - Per primary 6. Anemia- CKD 7. Hyponatremia - Mild currently and stable. Limit free water.  8. Sore - Pt states he has a sore between his testicles and anus.  Discussed with primary. They plan to address.   Length of Stay: [address]   Annamaria Helling  12/16/2016, 8:48 AM  Advanced Heart Failure Team Pager [phone] (M-F; 7a - 4p)  Please contact [facility] for night-coverage after hours (4p -7a ) and weekends on [email or web address]  Patient seen with PA, agree with the above note.  Continue amiodarone, ?PVC-mediated fall in EF.  Will plan holter as outpatient to make sure burden is decreasing.   Loralie Champagne 12/16/2016

## 2016-12-17 ENCOUNTER — Other Ambulatory Visit (HOSPITAL_COMMUNITY): Payer: Self-pay | Admitting: Cardiology

## 2016-12-17 DIAGNOSIS — I493 Ventricular premature depolarization: Secondary | ICD-10-CM

## 2016-12-17 LAB — CBC
HEMATOCRIT: 28.1 % — AB (ref 39.0–52.0)
HEMOGLOBIN: 9.2 g/dL — AB (ref 13.0–17.0)
MCH: 29.7 pg (ref 26.0–34.0)
MCHC: 32.7 g/dL (ref 30.0–36.0)
MCV: 90.6 fL (ref 78.0–100.0)
Platelets: 146 10*3/uL — ABNORMAL LOW (ref 150–400)
RBC: 3.1 MIL/uL — ABNORMAL LOW (ref 4.22–5.81)
RDW: 16.3 % — AB (ref 11.5–15.5)
WBC: 7.8 10*3/uL (ref 4.0–10.5)

## 2016-12-17 LAB — RENAL FUNCTION PANEL
Albumin: 2.7 g/dL — ABNORMAL LOW (ref 3.5–5.0)
Anion gap: 8 (ref 5–15)
BUN: 16 mg/dL (ref 6–20)
CHLORIDE: 95 mmol/L — AB (ref 101–111)
CO2: 30 mmol/L (ref 22–32)
Calcium: 7.9 mg/dL — ABNORMAL LOW (ref 8.9–10.3)
Creatinine, Ser: 4.54 mg/dL — ABNORMAL HIGH (ref 0.61–1.24)
GFR calc Af Amer: 14 mL/min — ABNORMAL LOW (ref >60)
GFR, EST NON AFRICAN AMERICAN: 12 mL/min — AB (ref >60)
Glucose, Bld: 170 mg/dL — ABNORMAL HIGH (ref 65–99)
POTASSIUM: 3.9 mmol/L (ref 3.5–5.1)
Phosphorus: 3.4 mg/dL (ref 2.5–4.6)
Sodium: 133 mmol/L — ABNORMAL LOW (ref 135–145)

## 2016-12-17 LAB — GLUCOSE, CAPILLARY
Glucose-Capillary: 277 mg/dL — ABNORMAL HIGH (ref 65–99)
Glucose-Capillary: 146 mg/dL — ABNORMAL HIGH (ref 65–99)
Glucose-Capillary: 208 mg/dL — ABNORMAL HIGH (ref 65–99)

## 2016-12-17 MED ORDER — ONDANSETRON HCL 4 MG/2ML IJ SOLN
INTRAMUSCULAR | Status: AC
  Administered 2016-12-17: 4 mg via INTRAVENOUS
  Filled 2016-12-17: qty 2

## 2016-12-17 MED ORDER — CALCITRIOL 0.5 MCG PO CAPS
ORAL_CAPSULE | ORAL | Status: AC
  Filled 2016-12-17: qty 2

## 2016-12-17 NOTE — Progress Notes (Signed)
Nutrition Follow-up  DOCUMENTATION CODES:   Non-severe (moderate) malnutrition in context of chronic illness   Pt meets criteria for moderate MALNUTRITION in the context of chronic illness as evidenced by energy intake of < 75% for >/= 1 month, mild fat mas depletion, and moderate muscle mass depletion.  INTERVENTION:  Continue 30 ml Prostat po BID, each supplement provides 100 kcal and 15 grams of protein.   Discontinue Boost Breeze due to poor acceptance.   Encourage adequate PO intake.   NUTRITION DIAGNOSIS:   Malnutrition related to chronic illness as evidenced by energy intake < 75% for > or equal to 1 month, mild depletion of body fat, moderate depletions of muscle mass; ongoing  GOAL:   Patient will meet greater than or equal to 90% of their needs; met  MONITOR:   PO intake, Supplement acceptance, Labs, Weight trends, Skin, I & O's  REASON FOR ASSESSMENT:   Malnutrition Screening Tool    ASSESSMENT:   71 y.o. male with ESRD, remote Hx bladder cancer, Type 2 DM, HFrEF (EF ~40%), non-obstructive CAD, and Hx subcapsular kidney bleed associated with atypical cyst (watched by urology) who was admitted with dyspnea/volume overload.  Has been dyspneic off and on x 2 weeks, also with worsened LE edema. Missed last HD due to URI symptoms. On admit, he was given IV lasix for volume symptoms. Afterwards, he developed lower abdominal pressure and found to be retaining urine. Foley was placed which has quite a bit of hematuria present.  Pt reports appetite is good. Meal completion reported to be 100% this AM. Pt reports he was eating at least 3 meals a day at home however meal completion has been 50-75%. Pt currently has Boost Breeze and Prostat ordered. Pt reports Boost Breeze is "too sweet" and has been refusing them. RD to modify orders, however will continue Prostat to aid in adequate protein needs. Pt encouraged to eat his foods at meals.   Nutrition-Focused physical exam  completed. Findings are mild fat depletion, mild to moderate muscle depletion, and moderate edema.   Labs and medications reviewed.   Diet Order:  Diet Carb Modified Fluid consistency: Thin; Room service appropriate? Yes  Skin:   (Incision on R neck)  Last BM:  3/7  Height:   Ht Readings from Last 1 Encounters:  12/11/16 '5\' 11"'  (1.803 m)    Weight:   Wt Readings from Last 1 Encounters:  12/17/16 192 lb 0.3 oz (87.1 kg)    Ideal Body Weight:  78 kg  BMI:  Body mass index is 26.78 kg/m.  Estimated Nutritional Needs:   Kcal:  2100-2300  Protein:  110-120 grams  Fluid:  Per MD  EDUCATION NEEDS:   No education needs identified at this time  Corrin Parker, MS, RD, LDN Pager # [phone] After hours/ weekend pager # [phone]

## 2016-12-17 NOTE — Progress Notes (Signed)
[facility] Progress Note   Subjective: no c/o's, on HD    Vitals:   12/17/16 0930 12/17/16 1000 12/17/16 1030 12/17/16 1048  BP: (!) 108/51 (!) 115/56 (!) 126/51 (!) 134/57  Pulse: 79 84 85 87  Resp: 16 15 18 12   Temp:    98.5 F (36.9 C)  TempSrc:    Oral  SpO2:      Weight:      Height:        Inpatient medications: . allopurinol  300 mg Oral Daily  . amiodarone  400 mg Oral BID  . atorvastatin  40 mg Oral QHS  . bisacodyl  10 mg Rectal Once  . calcitRIOL  1 mcg Oral Q T,Th,Sa-HD  . carvedilol  3.125 mg Oral 2 times per day on Sun Mon Wed Fri  . [START ON 12/19/2016] darbepoetin (ARANESP) injection - DIALYSIS  150 mcg Intravenous Q Sat-HD  . feeding supplement  1 Container Oral BID BM  . feeding supplement (PRO-STAT SUGAR FREE 64)  30 mL Oral BID  . finasteride  5 mg Oral Daily  . insulin aspart  0-9 Units Subcutaneous TID WC  . loratadine  10 mg Oral Daily  . multivitamin  1 tablet Oral QHS  . senna-docusate  2 tablet Oral BID    acetaminophen **OR** acetaminophen, albuterol, benzonatate, bisacodyl, cyclobenzaprine, famotidine, fluticasone, guaiFENesin-dextromethorphan, morphine injection, ondansetron **OR** ondansetron (ZOFRAN) IV, oxyCODONE-acetaminophen  Exam: No distress, calm, up in chair NO jvd Chest clear bilat RRR  Abd obese, soft ntnd Ext bilat pretib edema 1-2+ NF, Ox 3 AVF  CXR 3/1 - vasc congestion, mild, no edema  Dialysis: [facility] 4h 6min  400/800  95kg   2/2.25 bath  AVF LUA   Assessment: 1. Dyspnea/ LE edema/vol overload - significant lean body wt, 9 kg down by wts pre-HD today. Still some mild LE edema.  New dry wt should be 85- 88 kg range.  2. ESRD HD TTS 3. Combined S/D CHF - echo worse, EF now 20-25%. No CAD by cath May '17.  Per cards cont coreg.  4. Hematuria - traumatic foley cath, improved 5. Anemia of CKD / ABL - Hb down 12 > 9's, resumed esa 6. MBD - hold binder 7. DM2 8. Dispo - no further suggestions, OK  for dc from renal standpoint.   Plan - HD today   Kelly Splinter MD [facility] pager [phone]   12/17/2016, 12:50 PM    Recent Labs Lab 12/13/16 0201  12/15/16 0349 12/16/16 0722 12/17/16 0532  NA 136  < > 131* 131* 133*  K 4.2  < > 4.0 3.5 3.9  CL 100*  < > 94* 93* 95*  CO2 24  < > 26 25 30   GLUCOSE 73  < > 129* 187* 170*  BUN 14  < > 33* 27* 16  CREATININE 2.60*  < > 6.24* 5.32* 4.54*  CALCIUM 7.6*  < > 8.3* 7.6* 7.9*  PHOS 1.8*  --   --  4.8* 3.4  < > = values in this interval not displayed.  Recent Labs Lab 12/13/16 0201 12/16/16 0722 12/17/16 0532  ALBUMIN 3.0* 2.5* 2.7*    Recent Labs Lab 12/15/16 0349 12/16/16 0722 12/17/16 0806  WBC 9.8 9.0 7.8  HGB 9.9* 8.5* 9.2*  HCT 30.3* 26.0* 28.1*  MCV 90.2 90.0 90.6  PLT 175 157 146*   Iron/TIBC/Ferritin/ %Sat    Component Value Date/Time   IRON 17 (L) 12/07/2015 2122  TIBC 213 (L) 12/07/2015 2122   FERRITIN 544 (H) 12/07/2015 2122   IRONPCTSAT 8 (L) 12/07/2015 2122   IRONPCTSAT 24 05/03/2006 1529

## 2016-12-17 NOTE — Progress Notes (Signed)
PT NOTE: Pt off the unit at HD.  Will check back as schedule permits. Albert Glad L. Tamala Julian, [place] Pager [phone] 12/17/2016

## 2016-12-17 NOTE — Progress Notes (Signed)
PROGRESS NOTE    Albert Stanley  ZOX:[id number] DOB: [birth year] DOA: 12/09/2016 PCP: [name], MD    Brief Narrative: Albert Stanley is a 71 y.o. male with known ESRD on HD TTS, DM type II with complications of nephropathy, HTN, presented to [facility] ED with main concern of several days duration of progressively worsening dyspnea that initially started with exertion and has progressed to dyspnea at rest. He was found to be fluid overloaded, and was admitted for HD. He was also complaining of urinary retention and had traumatic foley catheter placement on 2/28 and had >500 ml of bloody urine. He continues to have intermittent bladder spasms. Urology consulted and was started on continuous bladder irrigation. Meanwhile he was started on HD. Repeat ECHO showed worsening of LV dysfunction from 40 to 20%, thought possibly due to frequent PVCs for which cardiology was consulted and has loaded amiodarone. HD has removed excess fluid and pt will be discharged with lower EDW.  Assessment & Plan:   Principal Problem:   Fluid overload Active Problems:   H/O: gout   End-stage renal disease on hemodialysis ([name])   Dyslipidemia associated with type 2 diabetes mellitus ([name])   Acute on chronic systolic and diastolic heart failure, NYHA class 1 ([name])   Diabetes mellitus with diabetic nephropathy without long-term current use of insulin ([name])   Anemia associated with chronic renal failure  Acute on chronic systolic and diastolic heart failure with fluid overload: Probably from missing HD on 2/27. Slight improvement with IV lasix. IV lasix stopped as HD resumed. Repeat echocardiogram ordered showed LV EF of 20 to 25%. Diffuse hypokinesis and abnormal relaxation and increased filling pressure, grade 2 diastolic dysfunction.  Worsened LV function when compared to the study in 11/2015.  - Started on amiodarone in view of multiple PVC'S and PVC burden, plan for holter monitor on discharge. HF team following  the patient.  - Continue coreg 3.125mg  BID on non-HD days - 2000cc fluid restriction, 2g salt restriction - Daily weights and strict intake and output.   ESRD: On TTS schedule, received additional HD 3/6 to remove fluid.  - HD per renal: Planning to lower EDW prior to discharge based on lean weight loss.   Diabetes mellitus: with nephropathy.  - Holding oral meds. Resume SSI.   Thrombocytopenia: No bleeding noted. - monitor platelet count.   Urinary retention with traumatic foley placement with bloody urine, clots , and associated bladder spasms.  - Urology consulted, started CBI 3/1, stopped 3/3 but abd pain recurred and clots recurred, so CBI restarted 3/4. Clear on very low rate, so will DC today per urology recommendations.  - Finasteride started for presumed prostatic bleeding.   Constipation:  - stool softeners ordered. Dulcolax added today.   DVT prophylaxis: scd's Code Status: (Full) Family Communication: none at bedside.  Disposition Plan: Plan to DC in next 24-48hrs, once new EDW established. PT eval pending.   Consultants:  Urology  Nephrology Cardiology Urology  Procedures:  HD  CBI   Antimicrobials:  None.  Subjective: Denies any chest pain, dyspnea, palpitations. CBI becoming clearer.   Objective: Vitals:   12/17/16 0930 12/17/16 1000 12/17/16 1030 12/17/16 1048  BP: (!) 108/51 (!) 115/56 (!) 126/51 (!) 134/57  Pulse: 79 84 85 87  Resp: 16 15 18 12   Temp:    98.5 F (36.9 C)  TempSrc:    Oral  SpO2:      Weight:      Height:  Intake/Output Summary (Last 24 hours) at 12/17/16 1242 Last data filed at 12/17/16 1048  Gross per 24 hour  Intake              600 ml  Output             4594 ml  Net            -3994 ml   Filed Weights   12/16/16 1136 12/16/16 2141 12/17/16 0719  Weight: 88 kg (194 lb 0.1 oz) 88.2 kg (194 lb 7.1 oz) 87.1 kg (192 lb 0.3 oz)    Examination:  General exam: 71yo M in no distress  Respiratory system: Clear  to auscultation. Respiratory effort normal. Cardiovascular system: S1 & S2 heard, RRR. No JVD, murmurs, rubs, gallops or clicks. 1+ bilateral LE edema Gastrointestinal system: lower abd tenderness improved. Normal bowel sounds heard. Central nervous system: Alert and oriented. No focal neurological deficits. Extremities: +thrill on AVF.  Data Reviewed: I have personally reviewed following labs and imaging studies  CBC:  Recent Labs Lab 12/11/16 0222 12/12/16 2110 12/15/16 0349 12/16/16 0722 12/17/16 0806  WBC 9.6 7.8 9.8 9.0 7.8  HGB 10.0* 9.2* 9.9* 8.5* 9.2*  HCT 29.7* 28.0* 30.3* 26.0* 28.1*  MCV 87.6 88.1 90.2 90.0 90.6  PLT 171 159 175 157 299*   Basic Metabolic Panel:  Recent Labs Lab 12/13/16 0201 12/13/16 1011 12/14/16 0255 12/15/16 0349 12/16/16 0722 12/17/16 0532  NA 136  --  134* 131* 131* 133*  K 4.2  --  4.4 4.0 3.5 3.9  CL 100*  --  97* 94* 93* 95*  CO2 24  --  29 26 25 30   GLUCOSE 73  --  184* 129* 187* 170*  BUN 14  --  25* 33* 27* 16  CREATININE 2.60*  --  4.93* 6.24* 5.32* 4.54*  CALCIUM 7.6*  --  8.3* 8.3* 7.6* 7.9*  MG  --  1.8  --   --   --   --   PHOS 1.8*  --   --   --  4.8* 3.4   GFR: Estimated Creatinine Clearance: 15.9 mL/min (by C-G formula based on SCr of 4.54 mg/dL (H)). Liver Function Tests:  Recent Labs Lab 12/13/16 0201 12/16/16 0722 12/17/16 0532  ALBUMIN 3.0* 2.5* 2.7*   No results for input(s): LIPASE, AMYLASE in the last 168 hours. No results for input(s): AMMONIA in the last 168 hours. Coagulation Profile: No results for input(s): INR, PROTIME in the last 168 hours. Cardiac Enzymes: No results for input(s): CKTOTAL, CKMB, CKMBINDEX, TROPONINI in the last 168 hours. BNP (last 3 results) No results for input(s): PROBNP in the last 8760 hours. HbA1C: No results for input(s): HGBA1C in the last 72 hours. CBG:  Recent Labs Lab 12/15/16 1543 12/15/16 2100 12/16/16 1215 12/16/16 1620 12/16/16 2019  GLUCAP 99 179*  90 224* 142*   Lipid Profile: No results for input(s): CHOL, HDL, LDLCALC, TRIG, CHOLHDL, LDLDIRECT in the last 72 hours. Thyroid Function Tests: No results for input(s): TSH, T4TOTAL, FREET4, T3FREE, THYROIDAB in the last 72 hours. Anemia Panel: No results for input(s): VITAMINB12, FOLATE, FERRITIN, TIBC, IRON, RETICCTPCT in the last 72 hours. Sepsis Labs: No results for input(s): PROCALCITON, LATICACIDVEN in the last 168 hours.  Recent Results (from the past 240 hour(s))  MRSA PCR Screening     Status: Abnormal   Collection Time: 12/09/16  9:39 PM  Result Value Ref Range Status   MRSA by PCR POSITIVE (A) NEGATIVE  Final    Comment:        The GeneXpert MRSA Assay (FDA approved for NASAL specimens only), is one component of a comprehensive MRSA colonization surveillance program. It is not intended to diagnose MRSA infection nor to guide or monitor treatment for MRSA infections. RESULT CALLED TO, READ BACK BY AND VERIFIED WITH: E CASTRO,RN @0012  12/10/16 [facility]     Radiology Studies: No results found.  Scheduled Meds: . allopurinol  300 mg Oral Daily  . amiodarone  400 mg Oral BID  . atorvastatin  40 mg Oral QHS  . bisacodyl  10 mg Rectal Once  . calcitRIOL      . calcitRIOL  1 mcg Oral Q T,Th,Sa-HD  . carvedilol  3.125 mg Oral 2 times per day on Sun Mon Wed Fri  . [START ON 12/19/2016] darbepoetin (ARANESP) injection - DIALYSIS  150 mcg Intravenous Q Sat-HD  . feeding supplement  1 Container Oral BID BM  . feeding supplement (PRO-STAT SUGAR FREE 64)  30 mL Oral BID  . finasteride  5 mg Oral Daily  . insulin aspart  0-9 Units Subcutaneous TID WC  . loratadine  10 mg Oral Daily  . multivitamin  1 tablet Oral QHS  . senna-docusate  2 tablet Oral BID   Continuous Infusions:   LOS: 8 days   Time spent: 25 minutes.   Vance Gather, MD [facility] Pager [phone]   If 7PM-7AM, please contact night-coverage [email or web address] Password [facility] 12/17/2016, 12:42  PM

## 2016-12-17 NOTE — Progress Notes (Signed)
Advanced Heart Failure Rounding Note   Subjective:   Albert Stanley is a 71 y.o. male with a history of HTN, right renal subcapsular hematoma, DMT2, obesity, DVT, ESRD on HD (TTS), HLD, bladder cancer, and NICM (EF 35-40%) who presented to [facility] on 12/09/16 with worsening LE edema and dyspnea in the setting of missing dialysis. 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   Denies SOB. Wants to go home.   LHC 02/2016 no CAD   Objective:   Weight Range:  Vital Signs:   Temp:  [97.6 F (36.4 C)-99 F (37.2 C)] 98.5 F (36.9 C) (03/08 1048) Pulse Rate:  [75-87] 87 (03/08 1048) Resp:  [11-23] 12 (03/08 1048) BP: (104-134)/(47-80) 134/57 (03/08 1048) SpO2:  [97 %-100 %] 98 % (03/08 0719) Weight:  [192 lb 0.3 oz (87.1 kg)-194 lb 7.1 oz (88.2 kg)] 192 lb 0.3 oz (87.1 kg) (03/08 0719) Last BM Date: 12/16/16  Weight change: Filed Weights   12/16/16 1136 12/16/16 2141 12/17/16 0719  Weight: 194 lb 0.1 oz (88 kg) 194 lb 7.1 oz (88.2 kg) 192 lb 0.3 oz (87.1 kg)    Intake/Output:   Intake/Output Summary (Last 24 hours) at 12/17/16 1119 Last data filed at 12/17/16 1048  Gross per 24 hour  Intake              600 ml  Output             9125 ml  Net            -8525 ml     Physical Exam: General:  Elderly. NAD. Walking in the room. HEENT: Normal Neck: supple. JVP 8-9 cm. Carotids 2+ bilat; no bruits. No thyromegaly or nodule noted.  Cor: PMI nondisplaced. Regular with ectopy. No M/G/R noted.  Lungs: Clear anteriorly (in HD) Abdomen: soft, NT, ND, no HSM. No bruits or masses. +BS  Extremities: no cyanosis, clubbing, rash, BLE 1+ edema.  Neuro: alert & orientedx3, cranial nerves grossly intact. moves all 4 extremities w/o difficulty. Affect pleasant GU- continuous bladder irrigation  Telemetry: Personally reviewed, NSR  70-80s with frequent PVCs up to bigeminy.   Labs: Basic Metabolic Panel:  Recent Labs Lab 12/13/16 0201 12/13/16 1011 12/14/16 0255 12/15/16 0349 12/16/16 0722 12/17/16 0532  NA 136  --  134* 131* 131* 133*  K 4.2  --  4.4 4.0 3.5 3.9  CL 100*  --  97* 94* 93* 95*  CO2 24  --  29 26 25 30   GLUCOSE 73  --  184* 129* 187* 170*  BUN 14  --  25* 33* 27* 16  CREATININE 2.60*  --  4.93* 6.24* 5.32* 4.54*  CALCIUM 7.6*  --  8.3* 8.3* 7.6* 7.9*  MG  --  1.8  --   --   --   --   PHOS 1.8*  --   --   --  4.8* 3.4    Liver Function Tests:  Recent Labs Lab 12/13/16 0201 12/16/16 0722 12/17/16 0532  ALBUMIN 3.0* 2.5* 2.7*   No results for input(s): LIPASE, AMYLASE in the last 168 hours. No results for input(s): AMMONIA in the last 168 hours.  CBC:  Recent Labs Lab 12/11/16 0222 12/12/16 2110 12/15/16 0349 12/16/16 0722 12/17/16 0806  WBC 9.6 7.8 9.8 9.0 7.8  HGB  10.0* 9.2* 9.9* 8.5* 9.2*  HCT 29.7* 28.0* 30.3* 26.0* 28.1*  MCV 87.6 88.1 90.2 90.0 90.6  PLT 171 159 175 157 146*    Cardiac Enzymes: No results for input(s): CKTOTAL, CKMB, CKMBINDEX, TROPONINI in the last 168 hours.  BNP: BNP (last 3 results)  Recent Labs  12/09/16 1237  BNP 3,698.3*    ProBNP (last 3 results) No results for input(s): PROBNP in the last 8760 hours.    Other results:  Imaging: No results found.   Medications:     Scheduled Medications: . allopurinol  300 mg Oral Daily  . amiodarone  400 mg Oral BID  . atorvastatin  40 mg Oral QHS  . bisacodyl  10 mg Rectal Once  . calcitRIOL      . calcitRIOL  1 mcg Oral Q T,Th,Sa-HD  . carvedilol  3.125 mg Oral 2 times per day on Sun Mon Wed Fri  . [START ON 12/19/2016] darbepoetin (ARANESP) injection - DIALYSIS  150 mcg Intravenous Q Sat-HD  . feeding supplement  1 Container Oral BID BM  . feeding supplement (PRO-STAT SUGAR FREE 64)  30 mL Oral BID  . finasteride  5 mg Oral Daily  . insulin aspart  0-9 Units Subcutaneous TID  WC  . loratadine  10 mg Oral Daily  . multivitamin  1 tablet Oral QHS  . senna-docusate  2 tablet Oral BID    Infusions:   PRN Medications: acetaminophen **OR** acetaminophen, albuterol, benzonatate, bisacodyl, cyclobenzaprine, famotidine, fluticasone, guaiFENesin-dextromethorphan, morphine injection, ondansetron **OR** ondansetron (ZOFRAN) IV, oxyCODONE-acetaminophen   Assessment/Plan/Discussion:   1. Dyspnea- missed HD last week. Admitted with volume overload and dyspnea .  2 . ESRD on HD 3. A/C Systolic Heart Failure- NICM.  Had [facility] 02/2016 no CAD. ECHO completed this admit with worsening LVEF. Previously EF was 35-40%, now EF down to 20-25% which is possibly PVC-induced.  - Continue amio 400 mg BID for now.  - Plan for holter monitor to assess PVC burden.  - Volume status improving with dialysis.  - Continue coreg 3.125 mg BID. Hold on dialysis days.  - Reinforced fluid restriction to < 2 L daily, sodium restriction to less than 2000 mg daily, and the importance of daily weights.   4. Hematuria- related to trauma associated with foley catheter. Receiving CBI. Urology following.  5. DMII  - Per primary 6. Anemia- CKD 7. Hyponatremia - Mild. Sodium 133.     Plan to set up Holter Monitor to assess PVC burden. Continue home carvedilol and amio 400 mg twice a day x1 week, then 200 mg twice a day x 1 week, then 200 mg daily.   Follow up in the HF clinic set up. 12/30/2016   Length of Stay: [place], NP  12/17/2016, 11:19 AM  Advanced Heart Failure Team Pager [phone] (M-F; 7a - 4p)  Please contact [facility] for night-coverage after hours (4p -7a ) and weekends on [email or web address]  Patient seen with NP, agree with the above note.  Stable, still with some PVCs.  Getting volume off via HD.  Plan on amiodarone to suppress PVCs (?causing worsened EF).  Follow the above amiodarone taper.  Will need to follow LFTs, TFTs, PFTs as outpatient.   Loralie Champagne 12/17/2016 4:09 PM

## 2016-12-17 NOTE — Progress Notes (Signed)
PT Cancellation Note  Patient Details Name: Albert Stanley MRN: [id number] DOB: 1946/07/06   Cancelled Treatment:    Reason Eval/Treat Not Completed: Patient declined, no reason specified. Pt up walking around room upon arrival.  He politely declined PT due to HD this AM and "been up on my legs since I got back".  Will check back tomorrow.   [facility] [employer] LUBECK 12/17/2016, 1:26 PM

## 2016-12-18 DIAGNOSIS — E440 Moderate protein-calorie malnutrition: Secondary | ICD-10-CM | POA: Insufficient documentation

## 2016-12-18 DIAGNOSIS — E1121 Type 2 diabetes mellitus with diabetic nephropathy: Secondary | ICD-10-CM

## 2016-12-18 LAB — RENAL FUNCTION PANEL
Albumin: 2.5 g/dL — ABNORMAL LOW (ref 3.5–5.0)
Anion gap: 10 (ref 5–15)
BUN: 20 mg/dL (ref 6–20)
CALCIUM: 7.9 mg/dL — AB (ref 8.9–10.3)
CO2: 29 mmol/L (ref 22–32)
CREATININE: 4.7 mg/dL — AB (ref 0.61–1.24)
Chloride: 95 mmol/L — ABNORMAL LOW (ref 101–111)
GFR, EST AFRICAN AMERICAN: 13 mL/min — AB (ref >60)
GFR, EST NON AFRICAN AMERICAN: 11 mL/min — AB (ref >60)
Glucose, Bld: 184 mg/dL — ABNORMAL HIGH (ref 65–99)
PHOSPHORUS: 4.3 mg/dL (ref 2.5–4.6)
Potassium: 4.3 mmol/L (ref 3.5–5.1)
SODIUM: 134 mmol/L — AB (ref 135–145)

## 2016-12-18 LAB — GLUCOSE, CAPILLARY
GLUCOSE-CAPILLARY: 241 mg/dL — AB (ref 65–99)
Glucose-Capillary: 139 mg/dL — ABNORMAL HIGH (ref 65–99)
Glucose-Capillary: 248 mg/dL — ABNORMAL HIGH (ref 65–99)

## 2016-12-18 MED ORDER — BISACODYL 10 MG RE SUPP: 10.0000 mg | Freq: Once | RECTAL | Status: DC

## 2016-12-18 MED ORDER — AMIODARONE HCL 400 MG PO TABS: 400.0000 mg | ORAL_TABLET | Freq: Two times a day (BID) | ORAL | 0 refills | Status: DC

## 2016-12-18 MED ORDER — FINASTERIDE 5 MG PO TABS: 5.0000 mg | ORAL_TABLET | Freq: Every day | ORAL | 0 refills | Status: DC

## 2016-12-18 NOTE — Progress Notes (Signed)
Patient left floor via wheelchair accompanied by staff and family. 

## 2016-12-18 NOTE — Care Management Note (Signed)
Case Management Note  Patient Details  Name: Albert Stanley MRN: [id number] Date of Birth: 1946/08/04  Subjective/Objective:    CM following for progression and d/c planning.                 Action/Plan: 12/18/2016 Spoke with [employer] rep re HH needs and none identified , no HHPT , HHOT or DME recommended. Pt bathing himself and ambulatory in his room , preparing for d/c to home.   Expected Discharge Date:  12/18/16               Expected Discharge Plan:  Home/Self Care  In-House Referral:     Discharge planning Services  CM Consult  Post Acute Care Choice:  NA Choice offered to:  NA  DME Arranged:   NA DME Agency:   NA  HH Arranged:   NA HH Agency:   NA  Status of Service:  Completed, signed off  If discussed at [place] of Stay Meetings, dates discussed:    Additional Comments:  Adron Bene, RN 12/18/2016, 2:26 PM

## 2016-12-18 NOTE — Consult Note (Signed)
           [facility] CM Primary Care Navigator  12/18/2016  Albert Stanley 07-Jul-1946 [id number]   Patient seen at the bedside to identify possible discharge needs. Patient reports having swelling to feet, ankles and legs that had led to this admission.  Patient endorses Dr. [name] with [facility] as the primary care provider.   Patient shared using [name] to obtain medications without any problem.  Patient reports managing his own medications at home using "pill box" system weekly.   Patient states being able to drive prior to admission, but wife Albert Stanley) can provide transportation to his doctors' appointments after discharge.   Wife and daughter Albert Beards) will be his primary caregivers at home per patient.  Anticipated discharge plan is home as stated.  Patient expressed understanding to call primary care provider's office, once he returns home, for a post discharge follow-up appointment within a week or sooner if needed. Patient letter (with PCP's contact number) was provided as a reminder.  Patient reports he had been managing DM well at home with recent A1C of 6.4 and denies needing further assistance at present.   Explained to patient about [facility] CM services available for health management (HF) but he states he will talk to his doctors on follow-up visit to see the need for it since he goes to dialysis 3 times a week (not at home).  Encouraged patient to request for referral to [facility] CM services if deemed appropriate.  [facility] care management contact information provided for future needs that may arise.    For questions, please contact:  Dannielle Huh, BSN, RN- [facility] Primary Care Navigator  Telephone: [phone] [place]

## 2016-12-18 NOTE — Progress Notes (Signed)
Pt discharge instructions given, pt verbalized understanding.  VSS.  Denies pain.  Pt waiting on wife for transporrtation home.

## 2016-12-18 NOTE — Care Management Note (Signed)
Case Management Note  Patient Details  Name: Albert Stanley MRN: [id number] Date of Birth: 1946-02-27  Subjective/Objective:        CM following for progression and d/c planning.             Action/Plan: 12/18/2016 Noted that PT eval not recommending any outpt PT, pt able to walk 250 ft with IV pole on 12/14/2016 and has since that time declined PT. Pt independent and ready for d/c , no HH needs identified.     Expected Discharge Date:  12/18/16               Expected Discharge Plan:  Home/Self Care  In-House Referral:     Discharge planning Services  CM Consult  Post Acute Care Choice:  NA Choice offered to:  NA  DME Arranged:   NA DME Agency:   NA  HH Arranged:   NA HH Agency:   NA  Status of Service:  Completed, signed off  If discussed at [place] of Stay Meetings, dates discussed:    Additional Comments:  Adron Bene, RN 12/18/2016, 12:56 PM

## 2016-12-18 NOTE — Discharge Summary (Signed)
Physician Discharge Summary  Albert Stanley ALP:[id number] DOB: 10/15/1945 DOA: 12/09/2016  PCP: [name], MD  Admit date: 12/09/2016 Discharge date: 12/18/2016  Admitted From: Home Disposition: Home   Recommendations for Outpatient Follow-up:  1. Follow up with PCP in 1-2 weeks 2. Please obtain BMP/CBC in one week 3. Global reduction in LV systolic function noted on echocardiogram, possibly PVC-mediated. Follow up with cardiology. 4. Monitor urine output. Had hematuria from traumatic foley insertion for urinary retention, resolved prior to discharge. Follow up with urology in 2 weeks. Started on finasteride.  5. Monitor heart rhythm: Plan is for holter monitoring per cardiology. Prescribed amiodarone 400 mg twice a day x1 week, then 200 mg twice a day x 1 week, then 200 mg daily.  Home Health: None recommended Equipment/Devices: None recommended.  Discharge Condition: Stable CODE STATUS: Full Diet recommendation: Renal, heart healthy  Brief/Interim Summary: Albert Stanley a 71 y.o.malewith ESRD on HD TTS, DM type II with complications of nephropathy, and HTN who presented to [facility] ED with main concern of several days duration of progressively worsening dyspnea that initially started with exertion and has progressed to dyspnea at rest. He was found to be fluid overloaded, and was admitted for HD. He was also complaining of urinary retention and had traumatic foley catheter placement on 2/28 and had >500 ml of bloody urine. He continues to have intermittent bladder spasms. Urology consulted and the patient was started on continuous bladder irrigation. Meanwhile he was started on HD. Repeat ECHO showed worsening of LV dysfunction from 40 to 20%, thought possibly due to frequent PVCs for which cardiology was consulted and has loaded amiodarone. HD has removed excess fluid and pt will be discharged with lower EDW (185-188lbs). The continuous bladder infusion was stopped and urine output  was no longer bloody. Foley was discontinued and he experienced no urinary retention.   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 Slight improvement with IV lasix. IV lasix stopped as HD resumed. Repeat echocardiogram ordered showed LV EF of 20 to 25%. Diffuse hypokinesis and abnormal relaxation and increased filling pressure, grade 2 diastolic dysfunction.  Worsened LV function when compared to the study in 11/2015.  - Started on amiodarone in view of multiple PVC'S and PVC burden, plan for holter monitor on discharge. HF team following the patient.  - Continue coreg 3.125mg  BID on non-HD days - 2000cc fluid restriction, 2g salt restriction - Daily weights and strict intake and output.   ESRD: On TTS schedule, received additional HD 3/6 to remove fluid.  - HD per renal: Planning to lower EDW prior to discharge based on lean weight loss.  - Dry weight at discharge 185-188lbs.   Diabetes mellitus: with nephropathy.  - Holding oral meds. Resume SSI.   Thrombocytopenia: No bleeding noted. - monitor platelet count.   Urinary retention with traumatic foley placement with bloody urine, clots , and associated bladder spasms.  - Urology consulted, started CBI 3/1, stopped 3/3  but abd pain recurred and clots recurred, so CBI restarted 3/4. Clear on very low rate, so was D/C'ed and foley pulled 3/08 with no further hematuria and no post-void  residual.   - Finasteride started for presumed prostatic bleeding.   Constipation:  - stool softeners ordered. Dulcolax added today.   Discharge Instructions Discharge Instructions    Discharge instructions    Complete by:  As directed    You were admitted for volume overload improved on hemodialysis. You will get set up for holter monitoring of your heart rhythm by cardiology.  - Continue your home medications as you were and add amiodarone:  - Take amiodarone 400 mg twice a day x1 week, then 200 mg twice a day x 1 week, then 200 mg daily until you follow up in heart failure clinic 3/21.  - To help shrink your prostate, take finasteride as directed until you follow up with urology.  - Follow up with urology in the next 2 weeks.  - If you experience shortness of breath, chest pain, inability to urinate accompanied by abdominal pain, or any other symptoms jyou should seek medical attention right away.     Allergies as of 12/18/2016      Reactions   Meperidine Shortness Of Breath, Other (See Comments)   Increased heart rate Reaction to demerol   Sulfa Antibiotics Shortness Of Breath, Other (See Comments)   Increased heart rate   Sulfonamide Derivatives Shortness Of Breath, Other (See Comments)   Increased heart rate   Eggs Or Egg-derived Products Nausea And Vomiting   Reaction to egg yolks - not whites   Foltx [elfolate Plus] Other (See Comments)   Pt does not recall reaction to this   Lac Bovis Diarrhea, Nausea And Vomiting, Other (See Comments)   Constipation Reaction to whole milk   Lactose Intolerance (gi) Diarrhea, Nausea And Vomiting, Other (See Comments)   Constipation Reaction to whole milk   Milk-related Compounds Diarrhea, Nausea And Vomiting, Other (See Comments)   Constipation Reaction to whole milk   Penicillins    "Knocks me out."   Has patient had a PCN reaction causing immediate rash, facial/tongue/throat swelling, SOB or lightheadedness with hypotension: yes-  blacked out Has patient had a PCN reaction causing severe rash involving mucus membranes or skin necrosis: unknown Has patient had a PCN reaction that required hospitalization: at MD office Has patient had a PCN reaction occurring within the last 10 years: no If all of the above answers are "NO", then may proceed with Cephalosporin use.   Betadine [povidone Iodine] Itching, Rash, Other (See Comments)   burning      Medication List    STOP taking these medications   oseltamivir 30 MG capsule Commonly known as:  TAMIFLU     TAKE these medications   albuterol 108 (90 Base) MCG/ACT inhaler Commonly known as:  PROVENTIL HFA;VENTOLIN HFA Inhale 1-2 puffs into the lungs every 6 (six) hours as needed for wheezing or shortness of breath.   allopurinol 300 MG tablet Commonly known as:  ZYLOPRIM Take 300 mg by mouth daily.   amiodarone 400 MG tablet Commonly known as:  PACERONE Take 1 tablet (400 mg total) by mouth 2 (two) times daily. 400mg  twice a day x1 week, then 200 mg twice a day x 1 week, then 200 mg daily.   atorvastatin 40 MG tablet Commonly known as:  LIPITOR Take 40 mg by mouth at bedtime.   BAYER BREEZE 2 TEST Disk Generic drug:  Glucose Blood Apply 1 each topically daily as needed (FOR TESTING BLOOD SUGAR).   benzonatate 100 MG capsule Commonly known as:  TESSALON PERLES Take 1 capsule (100 mg total) by mouth 3 (three) times daily as needed for cough.   calcitRIOL 0.5 MCG capsule Commonly known as:  ROCALTROL Take 0.5 mcg by mouth Every Tuesday,Thursday,and Saturday with dialysis. Medication given at dialysis on Tues Thurs Sat   carvedilol 3.125 MG tablet Commonly known as:  COREG Take 1 tablet (3.125 mg total) by mouth See admin instructions. Take 1 tablet (3.125 mg) by mouth twice daily on Monday, Wednesday, Friday and Sunday (non-dialysis days)   CLARITIN-D 24 HOUR PO Take 1 tablet by mouth daily. PATIENT NOT SURE OF THE DOSAGE   cyclobenzaprine 5 MG  tablet Commonly known as:  FLEXERIL Take 5 mg by mouth 2 (two) times daily as needed for muscle spasms.   famotidine 20 MG tablet Commonly known as:  PEPCID Take 1 tablet (20 mg total) by mouth 2 (two) times daily as needed (sour stomach).   finasteride 5 MG tablet Commonly known as:  PROSCAR Take 1 tablet (5 mg total) by mouth daily. Start taking on:  12/19/2016   fluticasone 50 MCG/ACT nasal spray Commonly known as:  FLONASE Place 2 sprays into both nostrils daily as needed for allergies or rhinitis.   glipiZIDE 5 MG tablet Commonly known as:  GLUCOTROL Take 0.5 tablets (2.5 mg total) by mouth 2 (two) times daily.   multivitamin Tabs tablet Take 1 tablet by mouth at bedtime.   oxyCODONE-acetaminophen 7.5-325 MG tablet Commonly known as:  PERCOCET Take 1 tablet by mouth 3 (three) times daily as needed (pain).   sevelamer carbonate 800 MG tablet Commonly known as:  RENVELA Take 2 tablets (1,600 mg total) by mouth 3 (three) times daily with meals.      Follow-up Information    Loralie Champagne, MD Follow up on 12/30/2016.   Specialty:  Cardiology Why:  at [place] information: [address] [address] [phone]        [name], MD. Schedule an appointment as soon as possible for a visit in 1 week(s).   Specialty:  Internal Medicine Contact information: [address] [place] [address] [phone]        [facility]. Call in 2 week(s).   Contact information: [place] [phone]         Allergies  Allergen Reactions  . Meperidine Shortness Of Breath and Other (See Comments)    Increased heart rate Reaction to demerol  . Sulfa Antibiotics Shortness Of Breath and Other (See Comments)    Increased heart rate  . Sulfonamide Derivatives Shortness Of Breath and Other (See Comments)    Increased heart rate  . Eggs Or  Egg-Derived Products Nausea And Vomiting     Reaction to egg yolks - not whites    . Foltx [Elfolate Plus] Other (See Comments)    Pt does not recall reaction to this  . Lac Bovis Diarrhea, Nausea And Vomiting and Other (See Comments)    Constipation Reaction to whole milk  . Lactose Intolerance (Gi) Diarrhea, Nausea And Vomiting and Other (See Comments)    Constipation Reaction to whole milk  . Milk-Related Compounds Diarrhea, Nausea And Vomiting and Other (See Comments)    Constipation Reaction to whole milk  . Penicillins     "  Knocks me out."   Has patient had a PCN reaction causing immediate rash, facial/tongue/throat swelling, SOB or lightheadedness with hypotension: yes- blacked out Has patient had a PCN reaction causing severe rash involving mucus membranes or skin necrosis: unknown Has patient had a PCN reaction that required hospitalization: at MD office Has patient had a PCN reaction occurring within the last 10 years: no If all of the above answers are "NO", then may proceed with Cephalosporin use.   . Betadine [Povidone Iodine] Itching, Rash and Other (See Comments)    burning    Consultations: Urology  Nephrology Cardiology Urology  Procedures/Studies: Dg Chest 2 View  Result Date: 12/09/2016 CLINICAL DATA:  Lower extremity swelling for 1 month. Shortness of breath. EXAM: CHEST  2 VIEW COMPARISON:  12/27/2015 FINDINGS: Dialysis catheter tip:  SVC. Tortuous thoracic aorta. Mild enlargement of the cardiopericardial silhouette. Stable scarring at the right lung base. Mild thoracic spondylosis. Upper zone pulmonary vascular prominence. IMPRESSION: 1. Mild enlargement of the cardiopericardial silhouette with prominent upper zone pulmonary vasculature suggesting cephalization of blood flow compatible with pulmonary venous hypertension. 2. Stable scarring at the right lung base. 3. Tortuous thoracic aorta. Electronically Signed   By: Van Clines M.D.   On: 12/09/2016  12:53   Dg Abd 2 Views  Result Date: 12/10/2016 CLINICAL DATA:  Abdominal pain for 2 days EXAM: ABDOMEN - 2 VIEW COMPARISON:  None. FINDINGS: Scattered large and small bowel gas is noted. Fecal material is noted throughout the colon without obstructive change. No abnormal mass or abnormal calcifications are seen. No free air is noted. Degenerative changes of the lumbar spine are seen. Left hip replacement is noted. IMPRESSION: Mild constipation although no obstructive changes are seen. Electronically Signed   By: Inez Catalina M.D.   On: 12/10/2016 13:34   Subjective: Study Conclusions  - Left ventricle: The cavity size was mildly dilated. Wall   thickness was increased in a pattern of mild LVH. Systolic   function was severely reduced. The estimated ejection fraction   was in the range of 20% to 25%. Diffuse hypokinesis. Features are   consistent with a pseudonormal left ventricular filling pattern,   with concomitant abnormal relaxation and increased filling   pressure (grade 2 diastolic dysfunction). - Mitral valve: Calcified annulus. There was mild regurgitation. - Left atrium: The atrium was severely dilated. - Right ventricle: The cavity size was mildly dilated. Systolic   function was mildly reduced. - Right atrium: The atrium was mildly dilated. - Pulmonary arteries: Systolic pressure was moderately increased.   PA peak pressure: 56 mm Hg (S). - Pericardium, extracardiac: A trivial pericardial effusion was   identified.  Impressions:  - Severe global reduction in LV systolic function; grade 2   diastolic dysfunction; mild LVE; mild LVH; mild MR; severe LAE;   mild RVE with mildly reduced function; mild RAE; mild TR with   moderately elevated pulmonary pressure.  Discharge Exam: Vitals:   12/18/16 0513 12/18/16 0934  BP: 135/62 98/62  Pulse: 78 87  Resp: 19 20  Temp: 98.6 F (37 C) 98.6 F (37 C)   General exam: 71yo M in no distress  Respiratory system: Clear to  auscultation. Respiratory effort normal. Cardiovascular system: S1 & S2 heard, RRR. No JVD, murmurs, rubs, gallops or clicks. 1+ bilateral LE edema Gastrointestinal system: lower abd tenderness improved. Normal bowel sounds heard. Central nervous system: Alert and oriented. No focal neurological deficits. Extremities: +thrill on AVF.  The results of significant diagnostics from  this hospitalization (including imaging, microbiology, ancillary and laboratory) are listed below for reference.    Labs: BNP (last 3 results)  Recent Labs  12/09/16 1237  BNP 3,888.2*   Basic Metabolic Panel:  Recent Labs Lab 12/13/16 0201 12/13/16 1011 12/14/16 0255 12/15/16 0349 12/16/16 0722 12/17/16 0532 12/18/16 0542  NA 136  --  134* 131* 131* 133* 134*  K 4.2  --  4.4 4.0 3.5 3.9 4.3  CL 100*  --  97* 94* 93* 95* 95*  CO2 24  --  29 26 25 30 29   GLUCOSE 73  --  184* 129* 187* 170* 184*  BUN 14  --  25* 33* 27* 16 20  CREATININE 2.60*  --  4.93* 6.24* 5.32* 4.54* 4.70*  CALCIUM 7.6*  --  8.3* 8.3* 7.6* 7.9* 7.9*  MG  --  1.8  --   --   --   --   --   PHOS 1.8*  --   --   --  4.8* 3.4 4.3   Liver Function Tests:  Recent Labs Lab 12/13/16 0201 12/16/16 0722 12/17/16 0532 12/18/16 0542  ALBUMIN 3.0* 2.5* 2.7* 2.5*   CBC:  Recent Labs Lab 12/12/16 2110 12/15/16 0349 12/16/16 0722 12/17/16 0806  WBC 7.8 9.8 9.0 7.8  HGB 9.2* 9.9* 8.5* 9.2*  HCT 28.0* 30.3* 26.0* 28.1*  MCV 88.1 90.2 90.0 90.6  PLT 159 175 157 146*   CBG:  Recent Labs Lab 12/17/16 1708 12/17/16 2008 12/18/16 0329 12/18/16 0730 12/18/16 1129  GLUCAP 146* 208* 241* 139* 248*   Urinalysis    Component Value Date/Time   COLORURINE YELLOW 12/04/2015 0707   APPEARANCEUR CLEAR 12/04/2015 0707   LABSPEC 1.012 12/04/2015 0707   PHURINE 5.5 12/04/2015 0707   GLUCOSEU NEGATIVE 12/04/2015 0707   HGBUR MODERATE (A) 12/04/2015 0707   BILIRUBINUR NEGATIVE 12/04/2015 0707   KETONESUR NEGATIVE 12/04/2015 0707    PROTEINUR 100 (A) 12/04/2015 0707   UROBILINOGEN 0.2 05/28/2010 1109   NITRITE NEGATIVE 12/04/2015 0707   LEUKOCYTESUR NEGATIVE 12/04/2015 8003    Microbiology Recent Results (from the past 240 hour(s))  MRSA PCR Screening     Status: Abnormal   Collection Time: 12/09/16  9:39 PM  Result Value Ref Range Status   MRSA by PCR POSITIVE (A) NEGATIVE Final    Comment:        The GeneXpert MRSA Assay (FDA approved for NASAL specimens only), is one component of a comprehensive MRSA colonization surveillance program. It is not intended to diagnose MRSA infection nor to guide or monitor treatment for MRSA infections. RESULT CALLED TO, READ BACK BY AND VERIFIED WITH: E CASTRO,RN @0012  12/10/16 MKELLY,MLT     Time coordinating discharge: Approximately 40 minutes  Vance Gather, MD  [facility] 12/18/2016, 12:18 PM Pager [phone]

## 2016-12-18 NOTE — Care Management Important Message (Signed)
Important Message  Patient Details  Name: Albert Stanley MRN: [id number] Date of Birth: Dec 03, 1945   Medicare Important Message Given:  Yes  Pt declined printed info states that he has received this before and understands the option to appeal d/c. Pt preparing to d/c to home today.   Anthany Thornhill, Rory Percy, RN 12/18/2016, 2:24 PM

## 2016-12-18 NOTE — Progress Notes (Signed)
Bladder scan done, patient holding only 59 cc's urine. Pt states does not urinate much.  Dr. Bonner Puna called advised results bladder scan.  Dr. Bonner Puna stated pt ok for discharge.

## 2016-12-18 NOTE — Progress Notes (Signed)
[facility] contacted to set up patient with Holter Monitor. The office will call him to arrange pick up of the monitor, results will go to [name].

## 2016-12-18 NOTE — Progress Notes (Signed)
Physical Therapy Treatment Patient Details Name: Albert Stanley MRN: [id number] DOB: 1945-12-24 Today's Date: 12/18/2016    History of Present Illness Albert Stanley is a 71 y.o. male with known ESRD on HD TTS, DM type II with complications of nephropathy, HTN, admitted with worsening dyspnea, progressive LE swelling and poor oral intake. He missed Tuesday HD prior to admission.  He also developed bladder outlet obstruction with traumatic foley placement and now with continuous irrigation     PT Comments    Progressing well as evidenced by 21/24 DGI and low risk of falls.  Pt about ready for d/c.    Follow Up Recommendations  No PT follow up     Equipment Recommendations  None recommended by PT    Recommendations for Other Services       Precautions / Restrictions Precautions Precautions: Fall Restrictions Weight Bearing Restrictions: No    Mobility  Bed Mobility Overal bed mobility: Modified Independent                Transfers Overall transfer level: Needs assistance Equipment used: None Transfers: Sit to/from Stand Sit to Stand: Modified independent (Device/Increase time)            Ambulation/Gait Ambulation/Gait assistance: Supervision Ambulation Distance (Feet): 350 Feet Assistive device: None Gait Pattern/deviations: Step-through pattern;Trendelenburg Gait velocity: decr Gait velocity interpretation: at or above normal speed for age/gender General Gait Details: Generally steady gait with mild R hip drop causing occasional shuffle   Stairs Stairs: Yes   Stair Management: One rail Right;Step to pattern;Forwards Number of Stairs: 2 General stair comments: safe with rail, limited by IV  Wheelchair Mobility    Modified Rankin (Stroke Patients Only)       Balance Overall balance assessment: Needs assistance   Sitting balance-Leahy Scale: Normal     Standing balance support: No upper extremity supported Standing balance-Leahy  Scale: Good                      Cognition Arousal/Alertness: Awake/alert Behavior During Therapy: WFL for tasks assessed/performed Overall Cognitive Status: Within Functional Limits for tasks assessed                      Exercises      General Comments        Pertinent Vitals/Pain Pain Assessment: No/denies pain    Home Living                      Prior Function            PT Goals (current goals can now be found in the care plan section) Acute Rehab PT Goals Patient Stated Goal: To go home PT Goal Formulation: With patient Time For Goal Achievement: 12/18/16 Potential to Achieve Goals: Good Progress towards PT goals: Progressing toward goals    Frequency    Min 3X/week      PT Plan Discharge plan needs to be updated    Co-evaluation             End of Session   Activity Tolerance: Patient tolerated treatment well Patient left: with call bell/phone within reach;Other (comment) Nurse Communication: Mobility status PT Visit Diagnosis: Other abnormalities of gait and mobility (R26.89)     Time: 5374-8270 PT Time Calculation (min) (ACUTE ONLY): 23 min  Charges:  $Gait Training: 8-22 mins $Therapeutic Activity: 8-22 mins  G CodesTessie Fass Maisen Schmit 12/18/2016, 1:30 PM 12/18/2016  Donnella Sham, [place] [phone]  (pager)

## 2016-12-18 NOTE — Progress Notes (Signed)
  [facility] Progress Note   Subjective: no c/o's, looks much better   Vitals:   12/17/16 1811 12/17/16 2227 12/18/16 0513 12/18/16 0934  BP: (!) 140/55 (!) 132/59 135/62 98/62  Pulse: 80 82 78 87  Resp: 16 18 19 20   Temp: 98.2 F (36.8 C) 99 F (37.2 C) 98.6 F (37 C) 98.6 F (37 C)  TempSrc: Oral Oral Oral Oral  SpO2: 98% 97% 97% 95%  Weight:  85.5 kg (188 lb 9.6 oz)    Height:  5\' 11"  (1.803 m)      Inpatient medications: . allopurinol  300 mg Oral Daily  . amiodarone  400 mg Oral BID  . atorvastatin  40 mg Oral QHS  . bisacodyl  10 mg Rectal Once  . calcitRIOL  1 mcg Oral Q T,Th,Sa-HD  . carvedilol  3.125 mg Oral 2 times per day on Sun Mon Wed Fri  . [START ON 12/19/2016] darbepoetin (ARANESP) injection - DIALYSIS  150 mcg Intravenous Q Sat-HD  . feeding supplement (PRO-STAT SUGAR FREE 64)  30 mL Oral BID  . finasteride  5 mg Oral Daily  . insulin aspart  0-9 Units Subcutaneous TID WC  . loratadine  10 mg Oral Daily  . multivitamin  1 tablet Oral QHS  . senna-docusate  2 tablet Oral BID    acetaminophen **OR** acetaminophen, albuterol, benzonatate, bisacodyl, cyclobenzaprine, famotidine, fluticasone, guaiFENesin-dextromethorphan, morphine injection, ondansetron **OR** ondansetron (ZOFRAN) IV, oxyCODONE-acetaminophen  Exam: No distress, calm, up in chair NO jvd Chest clear bilat RRR  Abd obese, soft ntnd Ext bilat pretib edema 1-2+ NF, Ox 3 AVF  CXR 3/1 - vasc congestion, mild, no edema  Dialysis: [facility] 4h 13min  400/800  95kg   2/2.25 bath  AVF LUA   Assessment: 1. Dyspnea/ LE edema/vol overload - significant lean body wt loss. Down 9-10 kg from prior dry wt. Looks much better.   2. ESRD HD TTS 3. Combined S/D CHF - echo worse, EF now 20-25%. No CAD by cath May '17.  Per cards cont coreg.  4. Hematuria - traumatic foley cath, improved 5. Anemia of CKD / ABL - Hb down 12 > 9's, resumed esa 6. MBD - hold binder 7. DM2 8. Dispo - no  further suggestions, OK for dc from renal standpoint.   Plan - ok for DC.  Will set new dry weight at 85kg.     Kelly Splinter MD [facility] pager [phone]   12/18/2016, 12:18 PM    Recent Labs Lab 12/16/16 0722 12/17/16 0532 12/18/16 0542  NA 131* 133* 134*  K 3.5 3.9 4.3  CL 93* 95* 95*  CO2 25 30 29   GLUCOSE 187* 170* 184*  BUN 27* 16 20  CREATININE 5.32* 4.54* 4.70*  CALCIUM 7.6* 7.9* 7.9*  PHOS 4.8* 3.4 4.3    Recent Labs Lab 12/16/16 0722 12/17/16 0532 12/18/16 0542  ALBUMIN 2.5* 2.7* 2.5*    Recent Labs Lab 12/15/16 0349 12/16/16 0722 12/17/16 0806  WBC 9.8 9.0 7.8  HGB 9.9* 8.5* 9.2*  HCT 30.3* 26.0* 28.1*  MCV 90.2 90.0 90.6  PLT 175 157 146*   Iron/TIBC/Ferritin/ %Sat    Component Value Date/Time   IRON 17 (L) 12/07/2015 2122   TIBC 213 (L) 12/07/2015 2122   FERRITIN 544 (H) 12/07/2015 2122   IRONPCTSAT 8 (L) 12/07/2015 2122   IRONPCTSAT 24 05/03/2006 1529

## 2016-12-30 ENCOUNTER — Inpatient Hospital Stay (HOSPITAL_COMMUNITY): Admit: 2016-12-30 | Payer: Medicare Other

## 2017-01-01 ENCOUNTER — Ambulatory Visit (INDEPENDENT_AMBULATORY_CARE_PROVIDER_SITE_OTHER): Payer: Medicare Other

## 2017-01-01 DIAGNOSIS — I493 Ventricular premature depolarization: Secondary | ICD-10-CM

## 2017-01-08 ENCOUNTER — Ambulatory Visit (HOSPITAL_COMMUNITY)
Admission: RE | Admit: 2017-01-08 | Discharge: 2017-01-08 | Disposition: A | Discharge status: Home / Self Care | Payer: Medicare Other | Source: Ambulatory Visit | Attending: Cardiology | Admitting: Cardiology

## 2017-01-08 VITALS — BP 150/80 | HR 86 | Wt 206.4 lb

## 2017-01-08 DIAGNOSIS — I428 Other cardiomyopathies: Secondary | ICD-10-CM | POA: Diagnosis not present

## 2017-01-08 DIAGNOSIS — E1122 Type 2 diabetes mellitus with diabetic chronic kidney disease: Secondary | ICD-10-CM | POA: Insufficient documentation

## 2017-01-08 DIAGNOSIS — I5042 Chronic combined systolic (congestive) and diastolic (congestive) heart failure: Secondary | ICD-10-CM

## 2017-01-08 DIAGNOSIS — I2510 Atherosclerotic heart disease of native coronary artery without angina pectoris: Secondary | ICD-10-CM | POA: Insufficient documentation

## 2017-01-08 DIAGNOSIS — I132 Hypertensive heart and chronic kidney disease with heart failure and with stage 5 chronic kidney disease, or end stage renal disease: Secondary | ICD-10-CM | POA: Insufficient documentation

## 2017-01-08 DIAGNOSIS — I493 Ventricular premature depolarization: Secondary | ICD-10-CM | POA: Diagnosis not present

## 2017-01-08 DIAGNOSIS — I509 Heart failure, unspecified: Secondary | ICD-10-CM | POA: Diagnosis not present

## 2017-01-08 DIAGNOSIS — Z7984 Long term (current) use of oral hypoglycemic drugs: Secondary | ICD-10-CM | POA: Insufficient documentation

## 2017-01-08 DIAGNOSIS — I5022 Chronic systolic (congestive) heart failure: Secondary | ICD-10-CM | POA: Diagnosis not present

## 2017-01-08 DIAGNOSIS — Z79899 Other long term (current) drug therapy: Secondary | ICD-10-CM | POA: Diagnosis not present

## 2017-01-08 DIAGNOSIS — N186 End stage renal disease: Secondary | ICD-10-CM | POA: Diagnosis not present

## 2017-01-08 MED ORDER — AMIODARONE HCL 200 MG PO TABS: 200.0000 mg | ORAL_TABLET | Freq: Every day | ORAL | 3 refills | Status: DC

## 2017-01-08 NOTE — Progress Notes (Signed)
PCP: Dr. Delfina Redwood Cardiology: [name] Nephrology: Dr Deterding  Urology: Dr Risa Grill  71 yo with history of HTN, DM, and ESRD presents for cardiology followup.  In 2/17, he was admitted with a right renal subcapsular hematoma.  He had been on warfarin for history of DVT, this was stopped.  Echo during that admission showed EF 35-40% with wall motion abnormalities concerning for CAD.  Cardiac cath was not done as creatinine was high and he was not yet on dialysis. Since that time, he has started HD.  He had a Cardiolite in 4/17 showing a large fixed inferior defect with EF 39%, intermediate risk study.  LHC was done in 5/17, showing no obstructive CAD and EF about 40%.    Admitted 2/28 through 12/18/2016 with volume overload. Missed HD appointment due to flu. HD restarted. Hospital course complicated by hematuria due to trauma associated with insertion of foley. Also had PVCs so amio was started.   Today he returns for HF follow up. Completed Holter Monitor. Overall feeling ok. Denies SOB/PND/Orthopnea. Not walking around much. Taking all medications. No bleeding problems. Tolerating HD.   ECG: NSR, PVCs, LVH, LAFB  Labs (3/17): HCT 24.3  Past Medical History: 1. CAD: [place] 2002 showed mild nonobstructive disease with 25% stenosis in LAD.  - Echo (2/17) with EF 35-40%, inferolateral and inferoseptal hypokinesis.  - Cardiolite (4/17) with EF 39%, large inferior/inferolateral primarily fixed defect, intermediate risk study.  - LHC (5/17) with nonobstructive CAD, EF 40%.  2. DM2 3. Obesity 4. ESRD: TTS HD.  5. HTN 6. Gout 7. Low back pain 8. 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 Anemia of renal disease.  10. H/o DVT 11. Right renal subcapsular hematoma in 2/17.  12. Hyperlipidemia.  13. Nonischemic cardiomyopathy: EF 35-40% by echo in 2/17--> ECHO 3/2/EF 20-25%.  14. 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   ROS: All systems reviewed and negative except as per HPI.   Current Outpatient Prescriptions  Medication Sig Dispense Refill  . albuterol (PROVENTIL HFA;VENTOLIN HFA) 108 (90 Base) MCG/ACT inhaler Inhale 1-2 puffs into the lungs every 6 (six) hours as needed for wheezing or shortness of breath. 1 Inhaler 0  . allopurinol (ZYLOPRIM) 300 MG tablet Take 300 mg by mouth daily.     Marland Kitchen amiodarone (PACERONE) 400 MG tablet Take 1 tablet (400 mg total) by mouth 2 (two) times daily. 400mg  twice a day x1 week, then 200 mg twice a day x 1 week, then 200 mg daily. 60 tablet 0  . atorvastatin (LIPITOR) 40 MG tablet Take 40 mg by mouth at bedtime.   0  . BAYER BREEZE 2 TEST DISK Apply 1 each topically daily as needed (FOR TESTING BLOOD SUGAR).   0  . benzonatate (TESSALON PERLES) 100 MG capsule Take 1 capsule (100 mg total) by mouth 3 (three) times daily as needed for cough. 20 capsule 0  . calcitRIOL (ROCALTROL) 0.5 MCG capsule Take 0.5 mcg by mouth Every Tuesday,Thursday,and Saturday with dialysis. Medication given at dialysis on Tues Thurs Sat  1  . carvedilol (COREG) 3.125 MG tablet Take 1 tablet (3.125 mg total) by mouth See admin instructions. Take 1 tablet (3.125 mg) by mouth twice daily on Monday, Wednesday, Friday and Sunday (non-dialysis days)    . cyclobenzaprine (FLEXERIL) 5 MG tablet Take 5 mg  by mouth 2 (two) times daily as needed for muscle spasms.     . famotidine (PEPCID) 20 MG tablet Take 1 tablet (20 mg total) by mouth 2 (two) times daily as needed (sour stomach).    . finasteride (PROSCAR) 5 MG tablet Take 1 tablet (5 mg total) by mouth daily. 30 tablet 0  . fluticasone (FLONASE) 50 MCG/ACT nasal spray Place 2 sprays into both nostrils daily as needed for allergies or rhinitis.     Marland Kitchen glipiZIDE (GLUCOTROL) 5 MG tablet Take 0.5 tablets (2.5 mg total) by mouth 2 (two) times daily.      . Loratadine-Pseudoephedrine (CLARITIN-D 24 HOUR PO) Take 1 tablet by mouth daily. PATIENT NOT SURE OF THE DOSAGE    . multivitamin (RENA-VIT) TABS tablet Take 1 tablet by mouth at bedtime.   0  . oxyCODONE-acetaminophen (PERCOCET) 7.5-325 MG tablet Take 1 tablet by mouth 3 (three) times daily as needed (pain). 20 tablet 0  . sevelamer carbonate (RENVELA) 800 MG tablet Take 2 tablets (1,600 mg total) by mouth 3 (three) times daily with meals. 90 tablet 0   No current facility-administered medications for this encounter.    BP (!) 150/80 (BP Location: Left Arm, Patient Position: Sitting, Cuff Size: Normal)   Pulse 86   Wt 206 lb 6.4 oz (93.6 kg)   SpO2 100%   BMI 28.79 kg/m  General: NAD. Walked in the clinic. Wife present Neck: JVP 6-7cm, no thyromegaly or thyroid nodule.  Lungs: CTAB on room air.  CV: Nondisplaced PMI.  Heart regular S1/S2, no S3/S4, no murmur.    No carotid bruit.  Normal pedal pulses.  Abdomen: Soft, NT, ND, no hepatosplenomegaly, no distention.  Skin: Intact without lesions or rashes. Warm  Neurologic: Alert and oriented x 3.  Psych: Normal affect. Extremities: No clubbing or cyanosis.  HEENT: Normal.    Assessment/Plan: 1. CAD: No significant disease on 5/17 LHC. Continue statin. No CP.  2. Cardiomyopathy: NICM. ECHO 12/11/2016 EF 20-25%. ? PVC induced.  Volume status ok and managed with HD. Continue carvedilol at current dose.  Repeat ECHO in 3 months  3. CKD Stage V- On HD Tue/Thur/Sat 4. PVCs: < 1% PVCs per Dr [name] on Holter monitor. Plan to continue amio 200 mg daily. Next visit check TSH. Discussed that he needs yearly eye exams.   Follow up in 3 months with Dr [name].   Greater than 50% of the (total minutes 25) visit spent in counseling/coordination of care regarding heart failure and PVCs.      Della Scrivener NP-C 01/08/2017

## 2017-01-08 NOTE — Patient Instructions (Signed)
DECREASE Amiodarone to 200 mg (1 Tablet) Once Daily  Echocardiogram and Follow up with [name] in 3 Months

## 2017-01-18 ENCOUNTER — Other Ambulatory Visit (HOSPITAL_COMMUNITY): Payer: Self-pay | Admitting: Interventional Radiology

## 2017-01-18 DIAGNOSIS — N289 Disorder of kidney and ureter, unspecified: Secondary | ICD-10-CM

## 2017-02-03 ENCOUNTER — Ambulatory Visit (HOSPITAL_COMMUNITY)
Admission: RE | Admit: 2017-02-03 | Discharge: 2017-02-03 | Disposition: A | Discharge status: Home / Self Care | Payer: Medicare Other | Source: Ambulatory Visit | Attending: Interventional Radiology | Admitting: Interventional Radiology

## 2017-02-03 ENCOUNTER — Encounter (HOSPITAL_COMMUNITY): Payer: Self-pay

## 2017-02-03 DIAGNOSIS — I700 Atherosclerosis of aorta: Secondary | ICD-10-CM | POA: Diagnosis not present

## 2017-02-03 DIAGNOSIS — I2510 Atherosclerotic heart disease of native coronary artery without angina pectoris: Secondary | ICD-10-CM | POA: Insufficient documentation

## 2017-02-03 DIAGNOSIS — N289 Disorder of kidney and ureter, unspecified: Secondary | ICD-10-CM | POA: Diagnosis present

## 2017-02-24 ENCOUNTER — Telehealth: Payer: Self-pay | Admitting: Radiology

## 2017-02-24 NOTE — Telephone Encounter (Signed)
Phoned patient to review CT report of 02/03/2017 per Dr. Margaretmary Dys instructions.  The right renal lesion is smaller than previously noted and most likely a resolving cyst.    Dr Kathlene Cote would like to meet with the patient and his wife, Bertram Millard, to review CT and to discuss future follow up if needed.  Patient and his wife prefer to postpone follow up appointment until July 2018.  Our office will contact patient to schedule an appointment when the July schedule has been finalized.  Hadiyah Maricle [place], RN 02/24/2017 2:09 PM

## 2017-04-05 ENCOUNTER — Ambulatory Visit (HOSPITAL_COMMUNITY)
Admission: RE | Admit: 2017-04-05 | Discharge: 2017-04-05 | Disposition: A | Discharge status: Home / Self Care | Payer: Medicare Other | Source: Ambulatory Visit | Attending: Internal Medicine | Admitting: Internal Medicine

## 2017-04-05 ENCOUNTER — Ambulatory Visit (HOSPITAL_BASED_OUTPATIENT_CLINIC_OR_DEPARTMENT_OTHER)
Admission: RE | Admit: 2017-04-05 | Discharge: 2017-04-05 | Disposition: A | Discharge status: Home / Self Care | Payer: Medicare Other | Source: Ambulatory Visit | Attending: Cardiology | Admitting: Cardiology

## 2017-04-05 VITALS — BP 120/94 | HR 87 | Wt 188.4 lb

## 2017-04-05 DIAGNOSIS — I132 Hypertensive heart and chronic kidney disease with heart failure and with stage 5 chronic kidney disease, or end stage renal disease: Secondary | ICD-10-CM | POA: Diagnosis not present

## 2017-04-05 DIAGNOSIS — E1122 Type 2 diabetes mellitus with diabetic chronic kidney disease: Secondary | ICD-10-CM | POA: Insufficient documentation

## 2017-04-05 DIAGNOSIS — I429 Cardiomyopathy, unspecified: Secondary | ICD-10-CM | POA: Diagnosis not present

## 2017-04-05 DIAGNOSIS — I2510 Atherosclerotic heart disease of native coronary artery without angina pectoris: Secondary | ICD-10-CM | POA: Insufficient documentation

## 2017-04-05 DIAGNOSIS — Z992 Dependence on renal dialysis: Secondary | ICD-10-CM

## 2017-04-05 DIAGNOSIS — E785 Hyperlipidemia, unspecified: Secondary | ICD-10-CM | POA: Diagnosis not present

## 2017-04-05 DIAGNOSIS — I493 Ventricular premature depolarization: Secondary | ICD-10-CM

## 2017-04-05 DIAGNOSIS — Z86718 Personal history of other venous thrombosis and embolism: Secondary | ICD-10-CM | POA: Insufficient documentation

## 2017-04-05 DIAGNOSIS — N186 End stage renal disease: Secondary | ICD-10-CM

## 2017-04-05 DIAGNOSIS — I5042 Chronic combined systolic (congestive) and diastolic (congestive) heart failure: Secondary | ICD-10-CM

## 2017-04-05 DIAGNOSIS — I5022 Chronic systolic (congestive) heart failure: Secondary | ICD-10-CM

## 2017-04-05 DIAGNOSIS — I272 Pulmonary hypertension, unspecified: Secondary | ICD-10-CM | POA: Insufficient documentation

## 2017-04-05 DIAGNOSIS — I081 Rheumatic disorders of both mitral and tricuspid valves: Secondary | ICD-10-CM | POA: Insufficient documentation

## 2017-04-05 DIAGNOSIS — E669 Obesity, unspecified: Secondary | ICD-10-CM | POA: Insufficient documentation

## 2017-04-05 DIAGNOSIS — M109 Gout, unspecified: Secondary | ICD-10-CM | POA: Insufficient documentation

## 2017-04-05 LAB — COMPREHENSIVE METABOLIC PANEL
ALK PHOS: 51 U/L (ref 38–126)
ALT: 11 U/L — AB (ref 17–63)
ANION GAP: 14 (ref 5–15)
AST: 22 U/L (ref 15–41)
Albumin: 3.4 g/dL — ABNORMAL LOW (ref 3.5–5.0)
BUN: 46 mg/dL — ABNORMAL HIGH (ref 6–20)
CALCIUM: 8.8 mg/dL — AB (ref 8.9–10.3)
CO2: 26 mmol/L (ref 22–32)
CREATININE: 9.27 mg/dL — AB (ref 0.61–1.24)
Chloride: 97 mmol/L — ABNORMAL LOW (ref 101–111)
GFR calc Af Amer: 6 mL/min — ABNORMAL LOW (ref >60)
GFR, EST NON AFRICAN AMERICAN: 5 mL/min — AB (ref >60)
Glucose, Bld: 81 mg/dL (ref 65–99)
Potassium: 3.9 mmol/L (ref 3.5–5.1)
Sodium: 137 mmol/L (ref 135–145)
TOTAL PROTEIN: 6.6 g/dL (ref 6.5–8.1)
Total Bilirubin: 0.6 mg/dL (ref 0.3–1.2)

## 2017-04-05 LAB — TSH: TSH: 2.537 u[IU]/mL (ref 0.350–4.500)

## 2017-04-05 NOTE — Patient Instructions (Signed)
Labs today  PLEASE FOLLOW UP WITH DR [place] TO SEE IF YOU WOULD BE OK WITH YOU TAKING CARVEDILOL ON NON HD DAYS  We will contact you in 4 months to schedule your next appointment.

## 2017-04-05 NOTE — Progress Notes (Signed)
  Echocardiogram 2D Echocardiogram has been performed.  Jennette Dubin 04/05/2017, 10:00 AM

## 2017-04-05 NOTE — Progress Notes (Signed)
Advanced Heart Failure Medication Review by a Pharmacist  Does the patient  feel that his/her medications are working for him/her?  yes  Has the patient been experiencing any side effects to the medications prescribed?  no  Does the patient measure his/her own blood pressure or blood glucose at home?  no   Does the patient have any problems obtaining medications due to transportation or finances?   no  Understanding of regimen: good Understanding of indications: good Potential of compliance: good Patient understands to avoid NSAIDs. Patient understands to avoid decongestants.  Issues to address at subsequent visits: none  Pharmacist comments: Albert Stanley is a pleasant 71 y/o male who presents with no medication bottles or list, but he reports he left his medication bottles in his car. He endorses good adherence to his medication regimen. Patient had no medication related questions or concerns for me at this time.   Phillis Knack PharmD Candidate   Time with patient: 10 minutes Preparation and documentation time: 10 minutes Total time: 20 minutes

## 2017-04-05 NOTE — Progress Notes (Signed)
PCP: Dr. Delfina Redwood Cardiology: [name] Nephrology: Dr Deterding  Urology: Dr Risa Grill  71 yo with history of HTN, DM, and ESRD presents for cardiology followup.  In 2/17, he was admitted with a right renal subcapsular hematoma.  He had been on warfarin for history of DVT, this was stopped.  Echo during that admission showed EF 35-40% with wall motion abnormalities concerning for CAD.  Cardiac cath was not done as creatinine was high and he was not yet on dialysis. Since that time, he has started HD.  He had a Cardiolite in 4/17 showing a large fixed inferior defect with EF 39%, intermediate risk study.  LHC was done in 5/17, showing no obstructive CAD and EF about 40%.    Admitted 2/28 through 12/18/2016 with volume overload. Missed HD appointment due to flu. HD restarted. Echo was done, showing EF down to 25-30% . Hospital course complicated by hematuria due to trauma associated with insertion of foley. Also had frequent PVCs with concern for worsening of EF due to PVC-mediated cardiomyopathy, so amiodarone was started. Followup holter later in 3/18 showed rare PVCs, < 1% total beats.    Today he returns for HF follow up. PCP stopped Coreg due to low BP at HD (patient was only taking Coreg on non-HD days).  Main complaint currently is knee and back pain.  He walks with a cane. Still having problems with BP dropping at HD.  He is not short of breath walking short distances but is not very active.    Echo was done today, EF remains low at 20-25% with mildly decreased RV systolic function.   Labs (3/17): HCT 24.3  Past Medical History: 1. CAD: [place] 2002 showed mild nonobstructive disease with 25% stenosis in LAD.  - Echo (2/17) with EF 35-40%, inferolateral and inferoseptal hypokinesis.  - Cardiolite (4/17) with EF 39%, large inferior/inferolateral primarily fixed defect, intermediate risk study.  - LHC (5/17) with nonobstructive CAD, EF 40%.  2. DM2 3. Obesity 4. ESRD: TTS HD.  5. HTN 6.  Gout 7. Low back pain 8. 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 Anemia of renal disease.  10. H/o DVT 11. Right renal subcapsular hematoma in 2/17.  12. Hyperlipidemia.  13. Nonischemic cardiomyopathy: EF 35-40% by echo in 2/17--> ECHO 3/18 EF 25-30%.  - ?role for PVCs in cardiomyopathy.  Now on amiodarone.  - Echo (6/18) with EF 20-25%, mildly dilated LV, moderate diastolic dysfunction, RV mildly dilated with mildly decreased systolic function, PASP 56 mmHg, mild to moderate MR.  14. PVCs: Frequent.  Started on amiodarone in 3/18 given concern for PVC-mediated CMP.  - Holter on amiodarone in 3/18 showed < 1% PVCs.    Family History: Father died from "heart complications" at 73  Social History: Nonsmoker (quit 1998). 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   ROS: All systems reviewed and negative except as per HPI.   Current Outpatient Prescriptions  Medication Sig Dispense Refill  . albuterol (PROVENTIL HFA;VENTOLIN HFA) 108 (90 Base) MCG/ACT inhaler Inhale 1-2 puffs into the lungs every 6 (six) hours as needed for wheezing or shortness of breath. 1 Inhaler 0  . allopurinol (ZYLOPRIM) 300 MG tablet Take 300 mg by mouth daily.     Marland Kitchen amiodarone (PACERONE) 200 MG tablet Take 100 mg by mouth daily.    Marland Kitchen atorvastatin (LIPITOR) 40 MG tablet Take 40 mg by mouth at bedtime.   0  . BAYER BREEZE 2 TEST DISK Apply  1 each topically daily as needed (FOR TESTING BLOOD SUGAR).   0  . calcitRIOL (ROCALTROL) 0.5 MCG capsule Take 0.5 mcg by mouth Every Tuesday,Thursday,and Saturday with dialysis. Medication given at dialysis on Tues Thurs Sat  1  . famotidine (PEPCID) 20 MG tablet Take 1 tablet (20 mg total) by mouth 2 (two) times daily as needed (sour stomach).    . fluticasone (FLONASE) 50 MCG/ACT nasal spray Place 2 sprays into both nostrils daily as needed for allergies or rhinitis.     . Loratadine-Pseudoephedrine (CLARITIN-D 24 HOUR PO) Take 1 tablet  by mouth daily. PATIENT NOT SURE OF THE DOSAGE    . multivitamin (RENA-VIT) TABS tablet Take 1 tablet by mouth at bedtime.   0  . oxyCODONE-acetaminophen (PERCOCET) 7.5-325 MG tablet Take 1 tablet by mouth 3 (three) times daily as needed (pain). (Patient taking differently: Take 1 tablet by mouth every 6 (six) hours as needed (pain). ) 20 tablet 0  . sevelamer carbonate (RENVELA) 800 MG tablet Take 2 tablets (1,600 mg total) by mouth 3 (three) times daily with meals. 90 tablet 0  . carvedilol (COREG) 3.125 MG tablet Take 1 tablet (3.125 mg total) by mouth See admin instructions. Take 1 tablet (3.125 mg) by mouth twice daily on Monday, Wednesday, Friday and Sunday (non-dialysis days) (Patient not taking: Reported on 04/05/2017)    . glipiZIDE (GLUCOTROL) 5 MG tablet Take 0.5 tablets (2.5 mg total) by mouth 2 (two) times daily. (Patient not taking: Reported on 04/05/2017)     No current facility-administered medications for this encounter.    BP (!) 120/94   Pulse 87   Wt 188 lb 6.4 oz (85.5 kg)   SpO2 94%   BMI 26.28 kg/m  General: NAD.  Neck: JVP 8-9 cm, no thyromegaly or thyroid nodule.  Lungs: CTAB on room air.  CV: Nondisplaced PMI.  Heart regular S1/S2, no S3/S4, no murmur.  No carotid bruit.  Normal pedal pulses. 2+ ankle edema bilaterally.  Abdomen: Soft, NT, ND, no hepatosplenomegaly, no distention.  Skin: Intact without lesions or rashes. Warm  Neurologic: Alert and oriented x 3.  Psych: Normal affect. Extremities: No clubbing or cyanosis.  HEENT: Normal.   Assessment/Plan: 1. CAD: Nonobstructive disease on 5/17 LHC. Continue statin. No CP.  2. Chronic systolic CHF: Nonischemic cardiomyopathy. ECHO 12/11/2016 EF 20-25%. Concern that PVCs played a role in the CMP.  He was started on amiodarone and repeat holter in 3/18 showed minimal PVCs.  Repeat echo today, however, showed that EF remains 25-30%.  He is mildly volume overloaded on exam.  He is now off Coreg due to fall in BP with HD.   - He has seen no difference at HD after stopping low dose Coreg on non-HD days.  I would recommend that he restart Coreg 3.125 mg bid on non-HD days, but would like him to discuss with Dr. [name] before restarting.   - Volume management with HD.  - EF persistently low.  Benefit of ICD for patient with nonischemic cardiomyopathy is somewhat marginal for age > 35, and in setting of HD probably even less beneficial. Would hold off on ICD and continue amiodarone.  3. ESRD: Ongoing HD.  4. PVCs: < 1% PVCs on 3/18 holter while on amiodarone. 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  - Continue amiodarone 100 mg daily.  Will check LFTs and TSH today. He will need regular eye exams.   Follow up  in 4 months  Loralie Champagne 04/05/2017

## 2017-05-01 IMAGING — DX DG CHEST 1V PORT
1 series · 1 of 1 positions shown · non-contrast
Comparison: 02/25/2015

CLINICAL DATA: Volume overload

EXAM:
PORTABLE CHEST 1 VIEW

[chest ap]
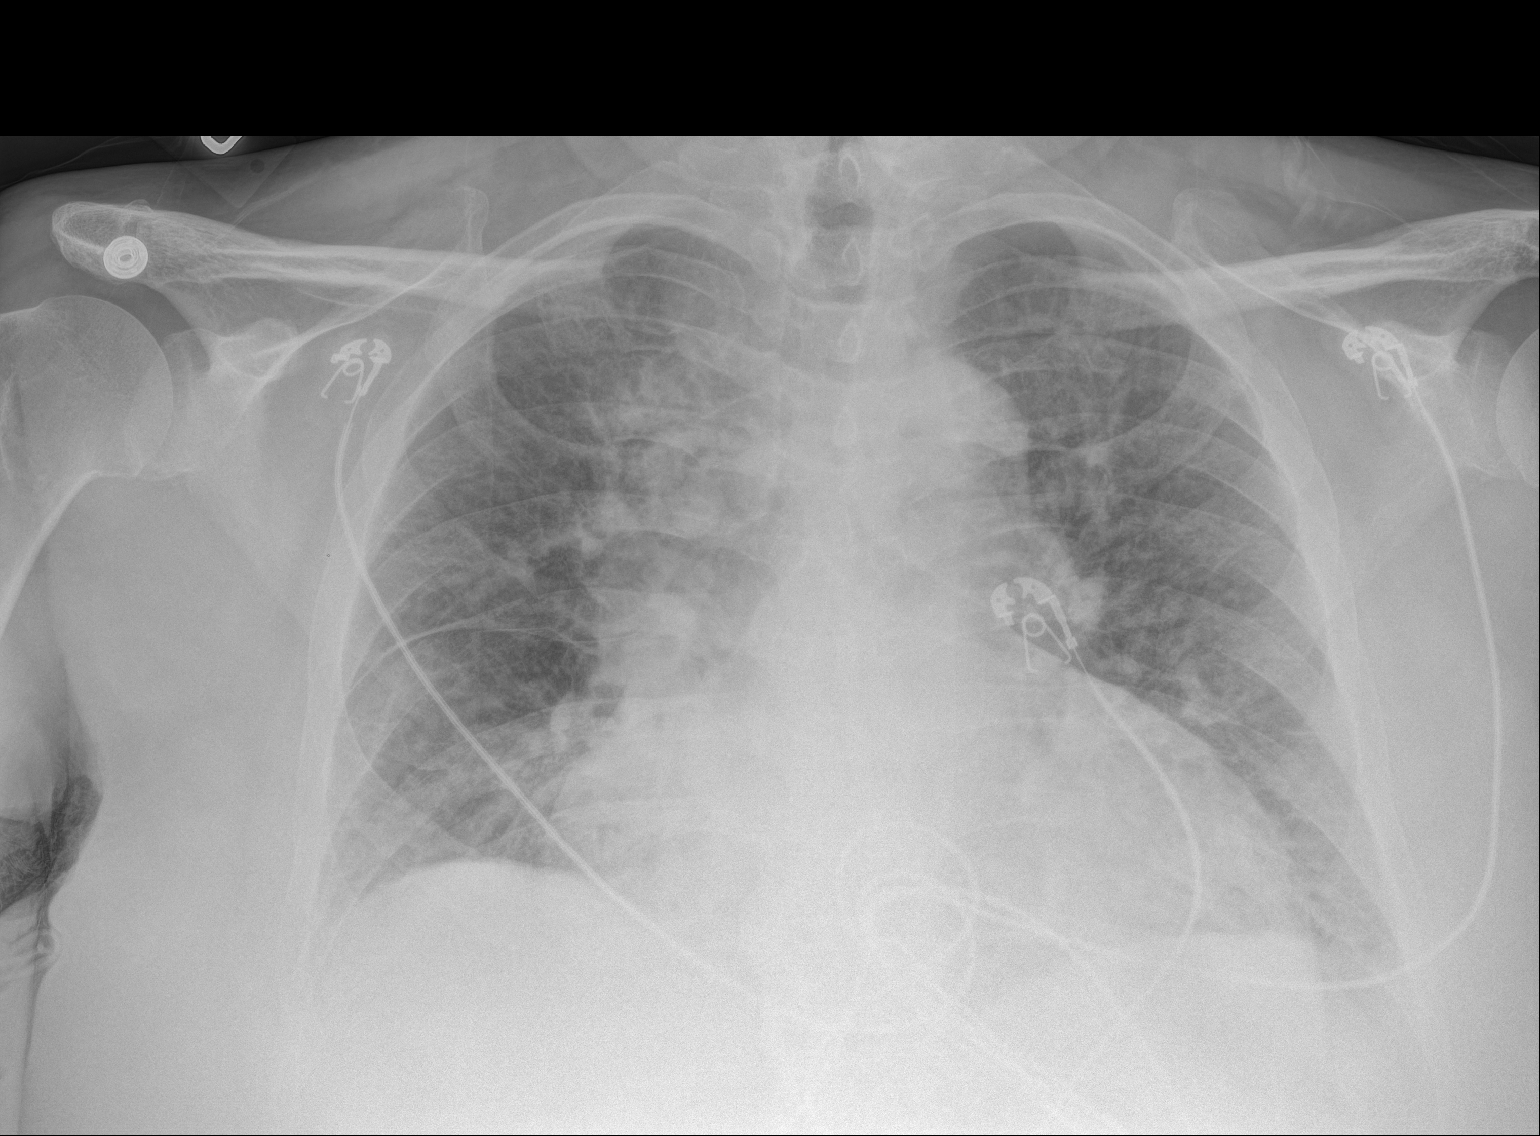

[1 of 1 positions shown; findings below may reference images not displayed]

FINDINGS: Cardiac shadow is enlarged. Pulmonary vascular congestion with mild
pulmonary edema is noted. No sizable effusion is seen. No bony
abnormality is noted.
IMPRESSION: Changes consistent with CHF consistent with the given clinical
history.

## 2017-05-06 ENCOUNTER — Encounter (HOSPITAL_COMMUNITY): Payer: Self-pay | Admitting: *Deleted

## 2017-05-06 ENCOUNTER — Encounter: Payer: Self-pay | Admitting: Vascular Surgery

## 2017-05-06 ENCOUNTER — Other Ambulatory Visit: Payer: Self-pay

## 2017-05-06 ENCOUNTER — Ambulatory Visit (INDEPENDENT_AMBULATORY_CARE_PROVIDER_SITE_OTHER): Payer: Medicare Other | Admitting: Vascular Surgery

## 2017-05-06 VITALS — BP 132/75 | HR 74 | Temp 97.7°F | Resp 18 | Ht 71.0 in | Wt 201.0 lb

## 2017-05-06 DIAGNOSIS — T82898A Other specified complication of vascular prosthetic devices, implants and grafts, initial encounter: Secondary | ICD-10-CM

## 2017-05-06 DIAGNOSIS — N186 End stage renal disease: Secondary | ICD-10-CM

## 2017-05-06 DIAGNOSIS — Z992 Dependence on renal dialysis: Secondary | ICD-10-CM | POA: Diagnosis not present

## 2017-05-06 NOTE — Progress Notes (Signed)
I spoke with Dr Ermalene Postin, informed him of EF of 25- 30, Hypokinesis, non obstructed CAD.  Dr Ermalene Postin said, ok we will evaluate him ion the am.

## 2017-05-06 NOTE — Progress Notes (Signed)
Albert Stanley denies chest pain or shortness of breath- "I don't do anything to get short of breath."  As we discussed patient's history, he was upset at the mention of Albert Stanley, " no one has every told me I have CHF."  Patient 's cardiologist is Dr [name]. Patient has a history of type II DM, he gets CBGs 1 time a day, in am , "runs around 120."  Patient does not take Glipizide at this time, " it caused my sugar to drop too much."  Albert Stanley reports that last A1C drawn at the [facility] "was 6 Something." I instructed patient to check CBG after awaking and every 2 hours until arrival  to the hospital.  I Instructed patient if CBG is less than 70 to drink1/2 cup of a clear juice, apple or cranberry.. Recheck CBG in 15 minutes then call pre- op desk at [phone] for further instructions.

## 2017-05-06 NOTE — Progress Notes (Signed)
HISTORY AND PHYSICAL     CC:  Complications with left arm fistula Requesting Provider:  [name], MD  HPI: This is a 71 y.o. male who had a left brachial cephalic AVF placed by Dr. Scot Dock in March 2017 and then in November 2017 he had a superficialization of the fistula with ligation of competing branches and repair of PSA also by Dr. Scot Dock.  Pt dialyzes on T/T/S.  He had dialysis this past Tuesday and Wednesday and was scheduled to have dialysis today, however, they inserted one needle and did not want to proceed further.    Pt underwent a procedure at [facility], but pt is unsure of what was done.  (We are requesting op note from yesterday).  He states that the fistula has gradually gotten bigger over time, however, after the procedure yesterday, it has gotten a little bit bigger.  He has not had any bleeding from the fistula.  He does have some bruising on the medial side of the arm, but he says this was present prior to the procedure yesterday.    The pt is on a statin for cholesterol management.  He is on a beta blocker for blood pressure management.  He is on oral agents for diabetes.  He is on an inhaler for his asthma.  He has a remote tobacco hx.  Past Medical History:  Diagnosis Date  . Asthma   . Bladder cancer ([place]) dx'd 1990   surg only  . Diabetes mellitus without complication ([name])   . ESRD (end stage renal disease) ([name])    Tues, Thurs, Sat dialysis  . History of cardiac catheterization    a. LHC 5/17: no obstructive CAD  . History of DVT (deep vein thrombosis)   . History of nuclear stress test    a. Myoview 4/17: EF 39%, inf, inf-lat, apical inf, apical lat, apical scar, intermediate risk  . Hyperlipidemia   . Hypertension   . NICM (nonischemic cardiomyopathy) ([place])    a. Echo 2/17: mod LVH, EF 35-40%, inf-lat and inf-sept HK, mod MR, severe LAE, small pericardial effusion    Past Surgical History:  Procedure Laterality Date  . arthroscopic knee surgery     . AV FISTULA PLACEMENT Left 12/13/2015   Procedure: ARTERIOVENOUS (AV) FISTULA CREATION;  Surgeon: Angelia Mould, MD;  Location: [place];  Service: Vascular;  Laterality: Left;  . BACK SURGERY     2 times  . CARDIAC CATHETERIZATION N/A 02/28/2016   Procedure: Left Heart Cath and Coronary Angiography;  Surgeon: Larey Dresser, MD;  Location: [place] [facility];  Service: Cardiovascular;  Laterality: N/A;  . CYSTOSCOPY    . FISTULA SUPERFICIALIZATION Left 08/14/2016   Procedure: FISTULA SUPERFICIALIZATION LEFT UPPER ARM;  Surgeon: Angelia Mould, MD;  Location: [place];  Service: Vascular;  Laterality: Left;  . INSERTION OF DIALYSIS CATHETER N/A 12/13/2015   Procedure: INSERTION OF DIALYSIS CATHETER;  Surgeon: Angelia Mould, MD;  Location: [place];  Service: Vascular;  Laterality: N/A;  . JOINT REPLACEMENT     hip  . LIGATION OF COMPETING BRANCHES OF ARTERIOVENOUS FISTULA Left 08/14/2016   Procedure: LIGATION OF COMPETING BRANCHES OF ARTERIOVENOUS FISTULA LEFT UPPER ARM;  Surgeon: Angelia Mould, MD;  Location: [place];  Service: Vascular;  Laterality: Left;  . PERIPHERAL VASCULAR CATHETERIZATION Left 06/09/2016   Procedure: Fistulagram;  Surgeon: Serafina Mitchell, MD;  Location: [place] [facility];  Service: Cardiovascular;  Laterality: Left;  ARM  . PERIPHERAL  VASCULAR CATHETERIZATION Left 06/09/2016   Procedure: Peripheral Vascular Balloon Angioplasty;  Surgeon: Serafina Mitchell, MD;  Location: [place] [facility];  Service: Cardiovascular;  Laterality: Left;  upper arm venous  . PERIPHERAL VASCULAR CATHETERIZATION Left 06/25/2016   Procedure: Fistulagram;  Surgeon: Serafina Mitchell, MD;  Location: [place] [facility];  Service: Cardiovascular;  Laterality: Left;    Allergies  Allergen Reactions  . Meperidine Shortness Of Breath and Other (See Comments)    Increased heart rate Reaction to demerol  . Sulfa Antibiotics Shortness Of Breath and Other (See Comments)    Increased  heart rate  . Sulfonamide Derivatives Shortness Of Breath and Other (See Comments)    Increased heart rate  . Eggs Or Egg-Derived Products Nausea And Vomiting     Reaction to egg yolks - not whites    . Foltx [Elfolate Plus] Other (See Comments)    Pt does not recall reaction to this  . [place] Diarrhea, Nausea And Vomiting and Other (See Comments)    Constipation Reaction to whole milk  . Lactose Intolerance (Gi) Diarrhea, Nausea And Vomiting and Other (See Comments)    Constipation Reaction to whole milk  . Milk-Related Compounds Diarrhea, Nausea And Vomiting and Other (See Comments)    Constipation Reaction to whole milk  . Penicillins     "Knocks me out."   Has patient had a PCN reaction causing immediate rash, facial/tongue/throat swelling, SOB or lightheadedness with hypotension: yes- blacked out Has patient had a PCN reaction causing severe rash involving mucus membranes or skin necrosis: unknown Has patient had a PCN reaction that required hospitalization: at MD office Has patient had a PCN reaction occurring within the last 10 years: no If all of the above answers are "NO", then may proceed with Cephalosporin use.   . Betadine [Povidone Iodine] Itching, Rash and Other (See Comments)    burning    Current Outpatient Prescriptions  Medication Sig Dispense Refill  . albuterol (PROVENTIL HFA;VENTOLIN HFA) 108 (90 Base) MCG/ACT inhaler Inhale 1-2 puffs into the lungs every 6 (six) hours as needed for wheezing or shortness of breath. 1 Inhaler 0  . allopurinol (ZYLOPRIM) 300 MG tablet Take 300 mg by mouth daily.     Marland Kitchen amiodarone (PACERONE) 200 MG tablet Take 100 mg by mouth daily.    Marland Kitchen atorvastatin (LIPITOR) 40 MG tablet Take 40 mg by mouth at bedtime.   0  . BAYER BREEZE 2 TEST DISK Apply 1 each topically daily as needed (FOR TESTING BLOOD SUGAR).   0  . calcitRIOL (ROCALTROL) 0.5 MCG capsule Take 0.5 mcg by mouth Every Tuesday,Thursday,and Saturday with dialysis.  Medication given at dialysis on Tues Thurs Sat  1  . famotidine (PEPCID) 20 MG tablet Take 1 tablet (20 mg total) by mouth 2 (two) times daily as needed (sour stomach).    . fluticasone (FLONASE) 50 MCG/ACT nasal spray Place 2 sprays into both nostrils daily as needed for allergies or rhinitis.     . Loratadine-Pseudoephedrine (CLARITIN-D 24 HOUR PO) Take 1 tablet by mouth daily. PATIENT NOT SURE OF THE DOSAGE    . multivitamin (RENA-VIT) TABS tablet Take 1 tablet by mouth at bedtime.   0  . oxyCODONE-acetaminophen (PERCOCET) 7.5-325 MG tablet Take 1 tablet by mouth 3 (three) times daily as needed (pain). (Patient taking differently: Take 1 tablet by mouth every 6 (six) hours as needed (pain). ) 20 tablet 0  . sevelamer carbonate (RENVELA) 800 MG tablet Take 2  tablets (1,600 mg total) by mouth 3 (three) times daily with meals. 90 tablet 0  . carvedilol (COREG) 3.125 MG tablet Take 1 tablet (3.125 mg total) by mouth See admin instructions. Take 1 tablet (3.125 mg) by mouth twice daily on Monday, Wednesday, Friday and Sunday (non-dialysis days) (Patient not taking: Reported on 04/05/2017)    . glipiZIDE (GLUCOTROL) 5 MG tablet Take 0.5 tablets (2.5 mg total) by mouth 2 (two) times daily. (Patient not taking: Reported on 04/05/2017)     No current facility-administered medications for this visit.     Family History  Problem Relation Age of Onset  . Arthritis Mother   . Cancer Father   . Heart disease Father   . Hypertension Sister   . Alcohol abuse Brother   . Diabetes Daughter   . Heart attack Paternal Uncle     Social History   Social History  . Marital status: Married    Spouse name: N/A  . Number of children: N/A  . Years of education: N/A   Occupational History  . Retired    Social History Main Topics  . Smoking status: Former Research scientist (life sciences)  . Smokeless tobacco: Never Used  . Alcohol use No     Comment: Used to drink on the weekends, quit 2 years ago  . Drug use: No  . Sexual  activity: No   Other Topics Concern  . Not on file   Social History Narrative   Lives with wife and daughter.     ROS: [x]  Positive   [ ]  Negative   [ ]  All sytems reviewed and are negative  Cardiac: []  chest pain/pressure []  palpitations []  SOB lying flat []  DOE  Vascular: [x]  pain in legs while walking [x]  pain in feet that wakes you from sleep []  hx of DVT []  hx of phlebitis [x]  swelling in legs []  varicose veins  Pulmonary: []  productive cough []  asthma [x]  wheezing  Neurologic: []  weakness in []  arms []  legs []  numbness in []  arms []  legs [] difficulty speaking or slurred speech []  temporary loss of vision in one eye []  dizziness  Hematologic: []  bleeding problems []  problems with blood clotting easily  GI []  vomiting blood []  blood in stool  GU: [x]  CKD/renal failure  [x]  HD---[]  M/W/F [x]  T/T/F (had extra dialysis tx yesterday) []  burning with urination []  blood in urine  Psychiatric: []  hx of major depression  Integumentary: [x]  rashes or ulcers  Constitutional: []  fever []  chills  PHYSICAL EXAMINATION:  Vitals:   05/06/17 1306  BP: 132/75  Pulse: 74  Resp: 18  Temp: 97.7 F (36.5 C)   Body mass index is 28.03 kg/m.  General:  WDWN in NAD Gait: Slow with a cane HENT: WNL Pulmonary: normal non-labored breathing , without Rales, rhonchi,  wheezing Cardiac: regular, without  Murmurs, rubs or gallops; without carotid bruits Skin: small area of shiny skin over proximal portion of fistula; ecchymosis on the medial aspect of the left arm. No ulcerations are present on the fistula.  Vascular Exam/Pulses:   Right Left  Radial 2+ (normal) 2+ (normal)   Extremities:  Left arm fistula with +thrill/bruit; aneurysmal area proximal to the anastomosis; small incision with nylon suture in place distally;  BLE edema 3-4+ Musculoskeletal: no muscle wasting or atrophy  Neurologic: A&O X 3; Moving all extremities equally;  Speech is  fluent/normal  Non-Invasive Vascular Imaging:   None    ASSESSMENT/PLAN: 71 y.o. male  With ESRD who dialyzes T/T/S with  aneurysmal area of left arm fistula  - pt has aneurysmal area of left arm fistula that has slightly enlarged since yesterday after his procedure at [facility], which entailed angioplasty of the left subclavian vein and 2 areas mid fistula by Dr. Posey Pronto (05/05/17).   CD of fistulogram with be taken to the hospital for Dr. Scot Dock to view.  -given he has had a slight enlargement of the aneurysmal area and there is a small area of shinny skin proximal aneurysm, will proceed with revision tomorrow by Dr. Scot Dock.  If unable to salvage fistula, may need ligation of fistula with placement of dialysis catheter.  This was discussed with the pt and questions answered.   -he was to undergo an additional dialysis treatment today for swelling, but they were unable to dialyze him.  He had dialysis on Tuesday and Wednesday.  Will draw labs in am when he gets to short stay.   -discussed with pt that if he has any bleeding from the fistula to call 911.  He expressed understanding.   -   Leontine Locket, PA-C [facility] [phone]  Clinic MD:   Pt seen and examined with Dr. Oneida Alar.  History and exam details as above. 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 The aneurysm is approximately 5-7 cm in diameter. According to the patient's history and Dr. [name] the aneurysm has been rapidly expanding. I believe the patient needs revision of this fistula to prevent further expansion and possible risk of bleeding. We have scheduled the patient for this tomorrow. If the fistula is not salvageable he may need a catheter. Patient understands this.  Ruta Hinds, MD [facility] [place] Office: [phone] Pager: [phone]

## 2017-05-07 ENCOUNTER — Ambulatory Visit (HOSPITAL_COMMUNITY): Payer: Medicare Other | Admitting: Certified Registered Nurse Anesthetist

## 2017-05-07 ENCOUNTER — Encounter (HOSPITAL_COMMUNITY): Payer: Self-pay

## 2017-05-07 ENCOUNTER — Ambulatory Visit (HOSPITAL_COMMUNITY)
Admission: RE | Admit: 2017-05-07 | Discharge: 2017-05-07 | Disposition: A | Discharge status: Home / Self Care | Payer: Medicare Other | Source: Ambulatory Visit | Attending: Vascular Surgery | Admitting: Vascular Surgery

## 2017-05-07 ENCOUNTER — Ambulatory Visit (HOSPITAL_COMMUNITY): Payer: Medicare Other

## 2017-05-07 ENCOUNTER — Encounter (HOSPITAL_COMMUNITY)
Admission: RE | Disposition: A | Discharge status: Home / Self Care | Payer: Self-pay | Source: Ambulatory Visit | Attending: Vascular Surgery

## 2017-05-07 DIAGNOSIS — X58XXXA Exposure to other specified factors, initial encounter: Secondary | ICD-10-CM | POA: Diagnosis not present

## 2017-05-07 DIAGNOSIS — E1122 Type 2 diabetes mellitus with diabetic chronic kidney disease: Secondary | ICD-10-CM | POA: Insufficient documentation

## 2017-05-07 DIAGNOSIS — Z8249 Family history of ischemic heart disease and other diseases of the circulatory system: Secondary | ICD-10-CM | POA: Diagnosis not present

## 2017-05-07 DIAGNOSIS — Z95828 Presence of other vascular implants and grafts: Secondary | ICD-10-CM

## 2017-05-07 DIAGNOSIS — I728 Aneurysm of other specified arteries: Secondary | ICD-10-CM | POA: Insufficient documentation

## 2017-05-07 DIAGNOSIS — Z833 Family history of diabetes mellitus: Secondary | ICD-10-CM | POA: Diagnosis not present

## 2017-05-07 DIAGNOSIS — Z87891 Personal history of nicotine dependence: Secondary | ICD-10-CM | POA: Insufficient documentation

## 2017-05-07 DIAGNOSIS — Z79899 Other long term (current) drug therapy: Secondary | ICD-10-CM | POA: Insufficient documentation

## 2017-05-07 DIAGNOSIS — Z992 Dependence on renal dialysis: Secondary | ICD-10-CM | POA: Insufficient documentation

## 2017-05-07 DIAGNOSIS — K219 Gastro-esophageal reflux disease without esophagitis: Secondary | ICD-10-CM | POA: Insufficient documentation

## 2017-05-07 DIAGNOSIS — Z809 Family history of malignant neoplasm, unspecified: Secondary | ICD-10-CM | POA: Diagnosis not present

## 2017-05-07 DIAGNOSIS — I82C21 Chronic embolism and thrombosis of right internal jugular vein: Secondary | ICD-10-CM | POA: Diagnosis not present

## 2017-05-07 DIAGNOSIS — I509 Heart failure, unspecified: Secondary | ICD-10-CM | POA: Diagnosis not present

## 2017-05-07 DIAGNOSIS — Z419 Encounter for procedure for purposes other than remedying health state, unspecified: Secondary | ICD-10-CM

## 2017-05-07 DIAGNOSIS — Z8551 Personal history of malignant neoplasm of bladder: Secondary | ICD-10-CM | POA: Diagnosis not present

## 2017-05-07 DIAGNOSIS — Z811 Family history of alcohol abuse and dependence: Secondary | ICD-10-CM | POA: Diagnosis not present

## 2017-05-07 DIAGNOSIS — Z91011 Allergy to milk products: Secondary | ICD-10-CM | POA: Diagnosis not present

## 2017-05-07 DIAGNOSIS — Z882 Allergy status to sulfonamides status: Secondary | ICD-10-CM | POA: Insufficient documentation

## 2017-05-07 DIAGNOSIS — Z8261 Family history of arthritis: Secondary | ICD-10-CM | POA: Insufficient documentation

## 2017-05-07 DIAGNOSIS — Z91012 Allergy to eggs: Secondary | ICD-10-CM | POA: Diagnosis not present

## 2017-05-07 DIAGNOSIS — Z880 Allergy status to penicillin: Secondary | ICD-10-CM | POA: Diagnosis not present

## 2017-05-07 DIAGNOSIS — Z888 Allergy status to other drugs, medicaments and biological substances status: Secondary | ICD-10-CM | POA: Diagnosis not present

## 2017-05-07 DIAGNOSIS — Z96642 Presence of left artificial hip joint: Secondary | ICD-10-CM | POA: Insufficient documentation

## 2017-05-07 DIAGNOSIS — E785 Hyperlipidemia, unspecified: Secondary | ICD-10-CM | POA: Insufficient documentation

## 2017-05-07 DIAGNOSIS — I132 Hypertensive heart and chronic kidney disease with heart failure and with stage 5 chronic kidney disease, or end stage renal disease: Secondary | ICD-10-CM | POA: Diagnosis not present

## 2017-05-07 DIAGNOSIS — N186 End stage renal disease: Secondary | ICD-10-CM | POA: Diagnosis not present

## 2017-05-07 DIAGNOSIS — T82898A Other specified complication of vascular prosthetic devices, implants and grafts, initial encounter: Secondary | ICD-10-CM | POA: Insufficient documentation

## 2017-05-07 DIAGNOSIS — J45909 Unspecified asthma, uncomplicated: Secondary | ICD-10-CM | POA: Insufficient documentation

## 2017-05-07 DIAGNOSIS — M1990 Unspecified osteoarthritis, unspecified site: Secondary | ICD-10-CM | POA: Insufficient documentation

## 2017-05-07 DIAGNOSIS — I428 Other cardiomyopathies: Secondary | ICD-10-CM | POA: Insufficient documentation

## 2017-05-07 DIAGNOSIS — F419 Anxiety disorder, unspecified: Secondary | ICD-10-CM | POA: Insufficient documentation

## 2017-05-07 HISTORY — PX: INSERTION OF DIALYSIS CATHETER: SHX1324

## 2017-05-07 HISTORY — DX: Gastro-esophageal reflux disease without esophagitis: K21.9

## 2017-05-07 HISTORY — DX: Heart failure, unspecified: I50.9

## 2017-05-07 HISTORY — DX: Unspecified osteoarthritis, unspecified site: M19.90

## 2017-05-07 HISTORY — PX: REVISON OF ARTERIOVENOUS FISTULA: SHX6074

## 2017-05-07 LAB — GLUCOSE, CAPILLARY
GLUCOSE-CAPILLARY: 84 mg/dL (ref 65–99)
GLUCOSE-CAPILLARY: 115 mg/dL — AB (ref 65–99)
Glucose-Capillary: 64 mg/dL — ABNORMAL LOW (ref 65–99)
Glucose-Capillary: 98 mg/dL (ref 65–99)
Glucose-Capillary: 80 mg/dL (ref 65–99)
Glucose-Capillary: 120 mg/dL — ABNORMAL HIGH (ref 65–99)
Glucose-Capillary: 149 mg/dL — ABNORMAL HIGH (ref 65–99)

## 2017-05-07 LAB — SURGICAL PCR SCREEN
MRSA, PCR: POSITIVE — AB
STAPHYLOCOCCUS AUREUS: POSITIVE — AB

## 2017-05-07 SURGERY — REVISON OF ARTERIOVENOUS FISTULA
Anesthesia: General | Site: Neck | Laterality: Left

## 2017-05-07 MED ORDER — DEXTROSE 50 % IV SOLN
25.0000 mL | Freq: Once | INTRAVENOUS | Status: AC
  Administered 2017-05-07: 25 mL via INTRAVENOUS
  Filled 2017-05-07: qty 50

## 2017-05-07 MED ORDER — LIDOCAINE-EPINEPHRINE (PF) 1 %-1:200000 IJ SOLN
INTRAMUSCULAR | Status: AC
  Filled 2017-05-07: qty 30

## 2017-05-07 MED ORDER — VANCOMYCIN HCL IN DEXTROSE 1-5 GM/200ML-% IV SOLN
1000.0000 mg | INTRAVENOUS | Status: AC
  Administered 2017-05-07: 1000 mg via INTRAVENOUS
  Filled 2017-05-07: qty 200

## 2017-05-07 MED ORDER — MUPIROCIN 2 % EX OINT
1.0000 "application " | TOPICAL_OINTMENT | Freq: Once | CUTANEOUS | Status: AC
  Administered 2017-05-07: 1 via TOPICAL
  Filled 2017-05-07: qty 22

## 2017-05-07 MED ORDER — LIDOCAINE HCL (PF) 1 % IJ SOLN
INTRAMUSCULAR | Status: AC
  Filled 2017-05-07: qty 30

## 2017-05-07 MED ORDER — PROPOFOL 10 MG/ML IV BOLUS
INTRAVENOUS | Status: DC | PRN
  Administered 2017-05-07: 180 mg via INTRAVENOUS

## 2017-05-07 MED ORDER — DEXTROSE 50 % IV SOLN
INTRAVENOUS | Status: AC
  Administered 2017-05-07: 25 mL via INTRAVENOUS
  Filled 2017-05-07: qty 50

## 2017-05-07 MED ORDER — 0.9 % SODIUM CHLORIDE (POUR BTL) OPTIME
TOPICAL | Status: DC | PRN
  Administered 2017-05-07: 1000 mL

## 2017-05-07 MED ORDER — ONDANSETRON HCL 4 MG/2ML IJ SOLN: 4.0000 mg | Freq: Four times a day (QID) | INTRAMUSCULAR | Status: DC | PRN

## 2017-05-07 MED ORDER — OXYCODONE-ACETAMINOPHEN 5-325 MG PO TABS: 1.0000 | ORAL_TABLET | ORAL | 0 refills | Status: DC | PRN

## 2017-05-07 MED ORDER — OXYCODONE HCL 5 MG PO TABS
5.0000 mg | ORAL_TABLET | Freq: Once | ORAL | Status: AC | PRN
  Administered 2017-05-07: 5 mg via ORAL

## 2017-05-07 MED ORDER — HEPARIN SODIUM (PORCINE) 1000 UNIT/ML IJ SOLN
INTRAMUSCULAR | Status: AC
  Filled 2017-05-07: qty 1

## 2017-05-07 MED ORDER — HEPARIN SODIUM (PORCINE) 1000 UNIT/ML IJ SOLN
INTRAMUSCULAR | Status: DC | PRN
  Administered 2017-05-07: 1000 [IU]

## 2017-05-07 MED ORDER — FENTANYL CITRATE (PF) 100 MCG/2ML IJ SOLN
INTRAMUSCULAR | Status: AC
  Filled 2017-05-07: qty 2

## 2017-05-07 MED ORDER — IOPAMIDOL (ISOVUE-300) INJECTION 61%
INTRAVENOUS | Status: AC
  Filled 2017-05-07: qty 50

## 2017-05-07 MED ORDER — SODIUM CHLORIDE 0.9 % IV SOLN
INTRAVENOUS | Status: DC
  Administered 2017-05-07: 10:00:00 via INTRAVENOUS

## 2017-05-07 MED ORDER — ALBUMIN HUMAN 5 % IV SOLN
INTRAVENOUS | Status: DC | PRN
  Administered 2017-05-07: 14:00:00 via INTRAVENOUS

## 2017-05-07 MED ORDER — HEPARIN SODIUM (PORCINE) 5000 UNIT/ML IJ SOLN
INTRAMUSCULAR | Status: DC | PRN
  Administered 2017-05-07: 13:00:00

## 2017-05-07 MED ORDER — DEXTROSE 50 % IV SOLN
INTRAVENOUS | Status: AC
  Administered 2017-05-07: 50 mL via INTRAVENOUS
  Filled 2017-05-07: qty 50

## 2017-05-07 MED ORDER — FENTANYL CITRATE (PF) 250 MCG/5ML IJ SOLN
INTRAMUSCULAR | Status: AC
Start: 2017-05-07 — End: 2017-05-07
  Filled 2017-05-07: qty 5

## 2017-05-07 MED ORDER — DEXTROSE 50 % IV SOLN
1.0000 | INTRAVENOUS | Status: AC
  Administered 2017-05-07: 50 mL via INTRAVENOUS
  Filled 2017-05-07: qty 50

## 2017-05-07 MED ORDER — FENTANYL CITRATE (PF) 100 MCG/2ML IJ SOLN
25.0000 ug | INTRAMUSCULAR | Status: DC | PRN
  Administered 2017-05-07 (×2): 50 ug via INTRAVENOUS

## 2017-05-07 MED ORDER — OXYCODONE HCL 5 MG PO TABS
ORAL_TABLET | ORAL | Status: AC
  Filled 2017-05-07: qty 1

## 2017-05-07 MED ORDER — PROPOFOL 10 MG/ML IV BOLUS
INTRAVENOUS | Status: AC
  Filled 2017-05-07: qty 20

## 2017-05-07 MED ORDER — DEXTROSE 50 % IV SOLN
INTRAVENOUS | Status: AC
  Filled 2017-05-07: qty 50

## 2017-05-07 MED ORDER — OXYCODONE HCL 5 MG/5ML PO SOLN: 5.0000 mg | Freq: Once | ORAL | Status: AC | PRN

## 2017-05-07 MED ORDER — ONDANSETRON HCL 4 MG/2ML IJ SOLN: INTRAMUSCULAR | Status: DC | PRN

## 2017-05-07 MED ORDER — PHENYLEPHRINE 40 MCG/ML (10ML) SYRINGE FOR IV PUSH (FOR BLOOD PRESSURE SUPPORT)
PREFILLED_SYRINGE | INTRAVENOUS | Status: DC | PRN
  Administered 2017-05-07 (×4): 80 ug via INTRAVENOUS
  Administered 2017-05-07: 40 ug via INTRAVENOUS

## 2017-05-07 MED ORDER — PROTAMINE SULFATE 10 MG/ML IV SOLN
INTRAVENOUS | Status: DC | PRN
  Administered 2017-05-07: 50 mg via INTRAVENOUS

## 2017-05-07 MED ORDER — HEPARIN SODIUM (PORCINE) 1000 UNIT/ML IJ SOLN
INTRAMUSCULAR | Status: DC | PRN
  Administered 2017-05-07: 7000 [IU] via INTRAVENOUS

## 2017-05-07 MED ORDER — LIDOCAINE 2% (20 MG/ML) 5 ML SYRINGE
INTRAMUSCULAR | Status: DC | PRN
  Administered 2017-05-07: 60 mg via INTRAVENOUS

## 2017-05-07 MED ORDER — MIDAZOLAM HCL 2 MG/2ML IJ SOLN
INTRAMUSCULAR | Status: AC
  Filled 2017-05-07: qty 2

## 2017-05-07 MED ORDER — CHLORHEXIDINE GLUCONATE CLOTH 2 % EX PADS: 6.0000 | MEDICATED_PAD | Freq: Once | CUTANEOUS | Status: DC

## 2017-05-07 MED ORDER — LIDOCAINE HCL (PF) 1 % IJ SOLN
INTRAMUSCULAR | Status: DC | PRN
  Administered 2017-05-07: 30 mL

## 2017-05-07 MED ORDER — PHENYLEPHRINE HCL 10 MG/ML IJ SOLN
INTRAVENOUS | Status: DC | PRN
  Administered 2017-05-07: 45 ug/min via INTRAVENOUS

## 2017-05-07 MED ORDER — FENTANYL CITRATE (PF) 100 MCG/2ML IJ SOLN
INTRAMUSCULAR | Status: DC | PRN
  Administered 2017-05-07: 25 ug via INTRAVENOUS
  Administered 2017-05-07: 50 ug via INTRAVENOUS

## 2017-05-07 MED ORDER — EPHEDRINE SULFATE-NACL 50-0.9 MG/10ML-% IV SOSY
PREFILLED_SYRINGE | INTRAVENOUS | Status: DC | PRN
  Administered 2017-05-07 (×2): 10 mg via INTRAVENOUS

## 2017-05-07 MED ORDER — DEXTROSE 50 % IV SOLN
25.0000 mL | INTRAVENOUS | Status: AC
  Administered 2017-05-07: 25 mL via INTRAVENOUS
  Filled 2017-05-07: qty 50

## 2017-05-07 MED ORDER — PROTAMINE SULFATE 10 MG/ML IV SOLN
INTRAVENOUS | Status: AC
  Filled 2017-05-07: qty 5

## 2017-05-07 MED ORDER — ONDANSETRON HCL 4 MG/2ML IJ SOLN
INTRAMUSCULAR | Status: DC | PRN
  Administered 2017-05-07: 4 mg via INTRAVENOUS

## 2017-05-07 SURGICAL SUPPLY — 55 items
ADH SKN CLS APL DERMABOND .7 (GAUZE/BANDAGES/DRESSINGS) ×4
ARMBAND PINK RESTRICT EXTREMIT (MISCELLANEOUS) ×4 IMPLANT
BAG DECANTER FOR FLEXI CONT (MISCELLANEOUS) ×4 IMPLANT
BANDAGE ESMARK 6X9 LF (GAUZE/BANDAGES/DRESSINGS) IMPLANT
BIOPATCH RED 1 DISK 7.0 (GAUZE/BANDAGES/DRESSINGS) ×3 IMPLANT
BIOPATCH RED 1IN DISK 7.0MM (GAUZE/BANDAGES/DRESSINGS) ×1
BNDG CMPR 9X6 STRL LF SNTH (GAUZE/BANDAGES/DRESSINGS) ×2
BNDG ESMARK 6X9 LF (GAUZE/BANDAGES/DRESSINGS) ×4
CANISTER SUCT 3000ML PPV (MISCELLANEOUS) ×4 IMPLANT
CANNULA VESSEL 3MM 2 BLNT TIP (CANNULA) ×4 IMPLANT
CATH PALINDROME RT-P 15FX28CM (CATHETERS) ×2 IMPLANT
CHLORAPREP W/TINT 26ML (MISCELLANEOUS) ×4 IMPLANT
CLIP VESOCCLUDE MED 6/CT (CLIP) ×4 IMPLANT
CLIP VESOCCLUDE SM WIDE 6/CT (CLIP) ×4 IMPLANT
COVER PROBE W GEL 5X96 (DRAPES) ×2 IMPLANT
CUFF TOURNIQUET SINGLE 18IN (TOURNIQUET CUFF) ×2 IMPLANT
DERMABOND ADVANCED (GAUZE/BANDAGES/DRESSINGS) ×4
DERMABOND ADVANCED .7 DNX12 (GAUZE/BANDAGES/DRESSINGS) ×2 IMPLANT
DRAPE C-ARM 42X72 X-RAY (DRAPES) ×4 IMPLANT
DRAPE CHEST BREAST 15X10 FENES (DRAPES) ×4 IMPLANT
ELECT REM PT RETURN 9FT ADLT (ELECTROSURGICAL) ×4
ELECTRODE REM PT RTRN 9FT ADLT (ELECTROSURGICAL) ×2 IMPLANT
GAUZE SPONGE 4X4 16PLY XRAY LF (GAUZE/BANDAGES/DRESSINGS) ×4 IMPLANT
GLOVE BIO SURGEON STRL SZ 6.5 (GLOVE) ×4 IMPLANT
GLOVE BIO SURGEON STRL SZ7 (GLOVE) ×2 IMPLANT
GLOVE BIO SURGEON STRL SZ7.5 (GLOVE) ×4 IMPLANT
GLOVE BIO SURGEONS STRL SZ 6.5 (GLOVE) ×4
GLOVE BIOGEL PI IND STRL 6.5 (GLOVE) IMPLANT
GLOVE BIOGEL PI IND STRL 7.0 (GLOVE) IMPLANT
GLOVE BIOGEL PI IND STRL 8 (GLOVE) ×2 IMPLANT
GLOVE BIOGEL PI INDICATOR 6.5 (GLOVE) ×4
GLOVE BIOGEL PI INDICATOR 7.0 (GLOVE) ×10
GLOVE BIOGEL PI INDICATOR 8 (GLOVE) ×2
GLOVE ECLIPSE 6.5 STRL STRAW (GLOVE) ×2 IMPLANT
GOWN STRL REUS W/ TWL LRG LVL3 (GOWN DISPOSABLE) ×6 IMPLANT
GOWN STRL REUS W/TWL LRG LVL3 (GOWN DISPOSABLE) ×20
KIT BASIN OR (CUSTOM PROCEDURE TRAY) ×4 IMPLANT
KIT ROOM TURNOVER OR (KITS) ×4 IMPLANT
NDL 18GX1X1/2 (RX/OR ONLY) (NEEDLE) ×2 IMPLANT
NEEDLE 18GX1X1/2 (RX/OR ONLY) (NEEDLE) ×4 IMPLANT
NS IRRIG 1000ML POUR BTL (IV SOLUTION) ×4 IMPLANT
PACK CV ACCESS (CUSTOM PROCEDURE TRAY) ×4 IMPLANT
PAD ARMBOARD 7.5X6 YLW CONV (MISCELLANEOUS) ×8 IMPLANT
SPONGE LAP 18X18 X RAY DECT (DISPOSABLE) ×2 IMPLANT
SUT ETHILON 3 0 PS 1 (SUTURE) ×4 IMPLANT
SUT PROLENE 5 0 C 1 24 (SUTURE) ×4 IMPLANT
SUT PROLENE 6 0 BV (SUTURE) ×6 IMPLANT
SUT VIC AB 3-0 SH 27 (SUTURE) ×12
SUT VIC AB 3-0 SH 27X BRD (SUTURE) ×2 IMPLANT
SUT VICRYL 4-0 PS2 18IN ABS (SUTURE) ×6 IMPLANT
SYR 10ML LL (SYRINGE) ×4 IMPLANT
SYR 20CC LL (SYRINGE) ×6 IMPLANT
SYR 5ML LL (SYRINGE) ×6 IMPLANT
UNDERPAD 30X30 (UNDERPADS AND DIAPERS) ×4 IMPLANT
WATER STERILE IRR 1000ML POUR (IV SOLUTION) ×4 IMPLANT

## 2017-05-07 NOTE — Progress Notes (Signed)
Patient complaining of feeling agitated and states that he thinks his blood sugar is dropping again.  Blood sugar 80.  Notified Dr. Marcie Bal, received verbal order to give 1/2 amp D50.  Will continue to monitor patient.

## 2017-05-07 NOTE — Anesthesia Preprocedure Evaluation (Addendum)
Anesthesia Evaluation  Patient identified by MRN, date of birth, ID band Patient awake    Reviewed: Allergy & Precautions, H&P , NPO status , Patient's Chart, lab work & pertinent test results  Airway Mallampati: II   Neck ROM: full    Dental  (+) Dental Advisory Given, Teeth Intact   Pulmonary asthma , former smoker,    breath sounds clear to auscultation       Cardiovascular hypertension, +CHF   Rhythm:regular Rate:Normal  EF 30-40%   Neuro/Psych PSYCHIATRIC DISORDERS Anxiety    GI/Hepatic GERD  ,  Endo/Other  diabetes, Type 2  Renal/GU ESRF and DialysisRenal disease     Musculoskeletal  (+) Arthritis ,   Abdominal   Peds  Hematology   Anesthesia Other Findings Upper bridge given to wife   Reproductive/Obstetrics                            Anesthesia Physical Anesthesia Plan  ASA: III  Anesthesia Plan: MAC   Post-op Pain Management:    Induction: Intravenous  PONV Risk Score and Plan: 1 and Ondansetron, Dexamethasone, Treatment may vary due to age or medical condition and Propofol  Airway Management Planned: LMA  Additional Equipment:   Intra-op Plan:   Post-operative Plan:   Informed Consent: I have reviewed the patients History and Physical, chart, labs and discussed the procedure including the risks, benefits and alternatives for the proposed anesthesia with the patient or authorized representative who has indicated his/her understanding and acceptance.     Plan Discussed with: CRNA, Anesthesiologist and Surgeon  Anesthesia Plan Comments:        Anesthesia Quick Evaluation

## 2017-05-07 NOTE — Op Note (Signed)
    NAME: Albert Stanley    MRN: [id number] DOB: 1946/05/09    DATE OF OPERATION: 05/07/2017  PREOP DIAGNOSIS:    Aneurysm of left brachiocephalic AV fistula  POSTOP DIAGNOSIS:    Same   PROCEDURE:    Ultrasound-guided placement of left IJ 27 cm tunnel dialysis catheter Repair of pseudoaneurysm of left brachiocephalic AV fistula   SURGEON: Judeth Cornfield. Scot Dock, MD, FACS  ASSIST: Lafe Garin RNFA  ANESTHESIA: Gen.   EBL: Minimal  INDICATIONS:    Albert Stanley is a 71 y.o. male who was seen in the office yesterday with a 10 cm pseudoaneurysm involving his left brachiocephalic AV fistula. He presents for possible revision of the fistula versus ligation. I felt that regardless of what was required a would not be able to access the fistula initially and therefore also recommended placement of a tunneled dialysis catheter.  FINDINGS:    The right IJ had chronic clot in it and therefore placed a left IJ catheter.  TECHNIQUE:    The patient was taken to the operating room and received a general anesthetic. The neck and upper chest were prepped and draped in usual sterile fashion. I looked with the SonoSite at the right IJ and there was chronic clot present here and the vein was not fully decompressed. Therefore I elected to place a left-sided catheter. The left IJ was patent. Under ultrasound guidance, the left IJ was cannulated and edema guidewire introduced into the superior vena cava under fluoroscopic control. The tract over the wire was then dilated and then the dilator and peel-away sheath were advanced over the wire and the wire and dilator removed. A 27 cm catheter was passed through the peel-away sheath and positioned in the right atrium. The exit site of the catheter was selected and the skin anesthetized between the 2 areas. He Was brought to the tunnel technique appropriate length and the distal ports were attached. Both ports withdrew easily with heparinized saline  and filled with concentrated heparin. The cath was secured at its exit site with 3-0 nylon suture. The IJ cannulation site was closed with 4-0 subcutaneous stitch. Sterile dressing was applied.  Attention was then turned to the left arm. The left arm was prepped and draped in usual sterile fashion. I made an elliptical incision encompassing this large pseudoaneurysm in the proximal fistula. I then placed a tourniquet on the upper arm and the patient was heparinized. The arm was exsanguinated with an Esmarch bandage and the tourniquet inflated to 250 mmHg. Under tourniquet control, I entered the pseudoaneurysm and identified one hole that was filling the pseudoaneurysm. There was no other hole and therefore I felt that this was a pseudoaneurysm related to a old puncture site. This was repaired with interrupted 5-0 Prolene sutures. The tourniquet was then released and it was still a good thrill in the fistula. Hemostasis was obtained in the wound. The wound was closed with 2 deep layers of 3-0 Vicryl and the skin closed with 4-0 Vicryl. Dermabond was applied. The patient tolerated the procedure well and was transferred to the recovery room in stable condition. All needle and sponge counts were correct.  [name], MD, FACS [facility]  DATE OF DICTATION:   05/07/2017

## 2017-05-07 NOTE — Progress Notes (Signed)
Dr. Marcie Bal made aware of recent CBG of 40. New orders received.

## 2017-05-07 NOTE — Progress Notes (Signed)
VASCULAR SURGERY:  The patient was seen yesterday in the office with a 10 cm aneurysm of his left upper arm fistula. He was sent over for possible revision, possible ligation, and possible placement of a tunneled dialysis catheter. On my exam, he has a large 10 cm aneurysm in his proximal fistula. Even if the fistula can be salvaged with an interposition graft, it will not be enough room to cannulate the fistula and therefore I have explained that we will have to place a catheter. I will place a catheter and then if at all possible try to salvage this fistula, however he does understand that we may simply have to ligate the fistula and then reevaluate him for new access in the right arm in the future.  [name], MD, [place] [phone] Office: [phone]

## 2017-05-07 NOTE — Transfer of Care (Signed)
Immediate Anesthesia Transfer of Care Note  Patient: Albert Stanley  Procedure(s) Performed: Procedure(s): REPAIR OF PSEUDOANEURYSM  LEFT BRACHIO-CEPHALIC  ARM ARTERIOVENOUS FISTULA (Left) INSERTION OF DIALYSIS CATHETER LEFT INTERNAL JUGULAR PLACEMENT (Left)  Patient Location: PACU  Anesthesia Type:General  Level of Consciousness: drowsy  Airway & Oxygen Therapy: Patient Spontanous Breathing  Post-op Assessment: Report given to RN and Post -op Vital signs reviewed and stable  Post vital signs: Reviewed and stable  Last Vitals:  Vitals:   05/07/17 0940 05/07/17 1554  BP: (!) 154/77 (!) 144/82  Pulse: 63 79  Resp: 18 12  Temp: 36.9 C 36.4 C    Last Pain:  Vitals:   05/07/17 1554  TempSrc:   PainSc: 7       Patients Stated Pain Goal: 3 (28/76/81 1572)  Complications: No apparent anesthesia complications

## 2017-05-07 NOTE — Anesthesia Postprocedure Evaluation (Signed)
Anesthesia Post Note  Patient: Albert Stanley  Procedure(s) Performed: Procedure(s) (LRB): REPAIR OF PSEUDOANEURYSM  LEFT BRACHIO-CEPHALIC  ARM ARTERIOVENOUS FISTULA (Left) INSERTION OF DIALYSIS CATHETER LEFT INTERNAL JUGULAR PLACEMENT (Left)     Patient location during evaluation: PACU Anesthesia Type: General Level of consciousness: awake and alert and patient cooperative Pain management: pain level controlled Vital Signs Assessment: post-procedure vital signs reviewed and stable Respiratory status: spontaneous breathing and respiratory function stable Cardiovascular status: stable Anesthetic complications: no    Last Vitals:  Vitals:   05/07/17 1645 05/07/17 1648  BP:    Pulse: 82   Resp: 10   Temp:  (!) 36.3 C    Last Pain:  Vitals:   05/07/17 1648  TempSrc:   PainSc: 0-No pain                 Arly Salminen S

## 2017-05-07 NOTE — Progress Notes (Signed)
Dr. Marcie Bal made aware of recent CBG 26, with pt stating he feels "terrible". New orders received.

## 2017-05-07 NOTE — Progress Notes (Signed)
Dr. Marcie Bal made aware of recent CBG of 30, with pt stating he is irritable and anxious. New orders received.

## 2017-05-07 NOTE — Anesthesia Procedure Notes (Signed)
Procedure Name: LMA Insertion Date/Time: 05/07/2017 1:43 PM Performed by: Julieta Bellini Pre-anesthesia Checklist: Patient identified, Emergency Drugs available, Suction available and Patient being monitored Patient Re-evaluated:Patient Re-evaluated prior to induction Oxygen Delivery Method: Circle system utilized Preoxygenation: Pre-oxygenation with 100% oxygen Induction Type: IV induction Ventilation: Mask ventilation without difficulty LMA: LMA inserted LMA Size: 5.0 Number of attempts: 1 Placement Confirmation: positive ETCO2 and breath sounds checked- equal and bilateral Tube secured with: Tape Dental Injury: Teeth and Oropharynx as per pre-operative assessment

## 2017-05-08 ENCOUNTER — Encounter (HOSPITAL_COMMUNITY): Payer: Self-pay | Admitting: Vascular Surgery

## 2017-05-10 ENCOUNTER — Telehealth: Payer: Self-pay | Admitting: Vascular Surgery

## 2017-05-10 LAB — POCT I-STAT 4, (NA,K, GLUC, HGB,HCT)
Glucose, Bld: 64 mg/dL — ABNORMAL LOW (ref 65–99)
HEMATOCRIT: 31 % — AB (ref 39.0–52.0)
HEMOGLOBIN: 10.5 g/dL — AB (ref 13.0–17.0)
POTASSIUM: 3.9 mmol/L (ref 3.5–5.1)
SODIUM: 141 mmol/L (ref 135–145)

## 2017-05-10 NOTE — Telephone Encounter (Signed)
Sched appt 06/03/17 at 11:45. Lm on hm#.

## 2017-05-10 NOTE — Telephone Encounter (Signed)
-----   Message from Denman George, RN sent at 05/07/2017  6:55 PM EDT ----- Regarding: needs 2-3 week f/u with Dr. Scot Dock   ----- Message ----- From: Angelia Mould, MD Sent: 05/07/2017   3:38 PM To: [facility] Charge Pool Subject: charge and f/u                                  PROCEDURE:   Ultrasound-guided placement of left IJ 27 cm tunnel dialysis catheter Repair of pseudoaneurysm of left brachiocephalic AV fistula   SURGEON: Judeth Cornfield. Scot Dock, MD, FACS  ASSIST: Lafe Garin RNFA  He will need a follow up visit in 2-3 weeks. Thank you. CD

## 2017-05-12 ENCOUNTER — Ambulatory Visit
Admission: RE | Admit: 2017-05-12 | Discharge: 2017-05-12 | Disposition: A | Discharge status: Home / Self Care | Payer: Medicare Other | Source: Ambulatory Visit | Attending: Interventional Radiology | Admitting: Interventional Radiology

## 2017-05-12 DIAGNOSIS — N289 Disorder of kidney and ureter, unspecified: Secondary | ICD-10-CM

## 2017-05-12 HISTORY — PX: IR RADIOLOGIST EVAL & MGMT: IMG5224

## 2017-05-12 NOTE — Progress Notes (Signed)
Chief Complaint: Follow-up of right renal lesion.  History of Present Illness: Albert Stanley is a 71 y.o. male initially referred by Dr.Grapey last June for evaluation of a right renal lesion picked up in the setting of an acute subcapsular and perinephric hemorrhage of the right kidney. On a contrast-enhanced study, there was some suspicion of enhancement of a right renal lesion along the posterolateral interpolar cortex measuring 2.8 cm in greatest diameter. This showed some decrease in size on a follow-up scan dated 06/08/2016 to approximately 2.2 cm in greatest diameter. He now presents to review a further follow-up scan performed on 02/03/2017.  He wanted to delay reviewing the scan until this time due to some other medical issues. 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 Scot Dock four days ago.  Past Medical History:  Diagnosis Date  . Arthritis    Osteoarthritis  . Asthma   . Bladder cancer ([place]) dx'd 1990   surg only  . CHF (congestive heart failure) ([place])   . Diabetes mellitus without complication ([place])   . ESRD (end stage renal disease) ([name])    Tues, Thurs, Sat dialysis  . GERD (gastroesophageal reflux disease)   . History of cardiac catheterization    a. LHC 5/17: no obstructive CAD  . History of DVT (deep vein thrombosis)    2003 ish  . History of nuclear stress test    a. Myoview 4/17: EF 39%, inf, inf-lat, apical inf, apical lat, apical scar, intermediate risk  . Hyperlipidemia   . Hypertension   . NICM (nonischemic cardiomyopathy) ([place])    a. Echo 2/17: mod LVH, EF 35-40%, inf-lat and inf-sept HK, mod MR, severe LAE, small pericardial effusion    Past Surgical History:  Procedure Laterality Date  . arthroscopic knee surgery Left   . AV FISTULA PLACEMENT Left 12/13/2015   Procedure: ARTERIOVENOUS (AV) FISTULA CREATION;  Surgeon: Angelia Mould, MD;  Location: [facility];  Service: Vascular;   Laterality: Left;  . BACK SURGERY     2 times  1960  . CARDIAC CATHETERIZATION N/A 02/28/2016   Procedure: Left Heart Cath and Coronary Angiography;  Surgeon: Larey Dresser, MD;  Location: [facility];  Service: Cardiovascular;  Laterality: N/A;  . CYSTOSCOPY    . FISTULA SUPERFICIALIZATION Left 08/14/2016   Procedure: FISTULA SUPERFICIALIZATION LEFT UPPER ARM;  Surgeon: Angelia Mould, MD;  Location: [facility];  Service: Vascular;  Laterality: Left;  . INSERTION OF DIALYSIS CATHETER N/A 12/13/2015   Procedure: INSERTION OF DIALYSIS CATHETER;  Surgeon: Angelia Mould, MD;  Location: [facility];  Service: Vascular;  Laterality: N/A;  . INSERTION OF DIALYSIS CATHETER Left 05/07/2017   Procedure: INSERTION OF DIALYSIS CATHETER LEFT INTERNAL JUGULAR PLACEMENT;  Surgeon: Angelia Mould, MD;  Location: [facility];  Service: Vascular;  Laterality: Left;  . JOINT REPLACEMENT Left    hip  . LIGATION OF COMPETING BRANCHES OF ARTERIOVENOUS FISTULA Left 08/14/2016   Procedure: LIGATION OF COMPETING BRANCHES OF ARTERIOVENOUS FISTULA LEFT UPPER ARM;  Surgeon: Angelia Mould, MD;  Location: [facility];  Service: Vascular;  Laterality: Left;  . PERIPHERAL VASCULAR CATHETERIZATION Left 06/09/2016   Procedure: Fistulagram;  Surgeon: Serafina Mitchell, MD;  Location: [facility];  Service: Cardiovascular;  Laterality: Left;  ARM  . PERIPHERAL VASCULAR CATHETERIZATION Left 06/09/2016   Procedure: Peripheral Vascular Balloon Angioplasty;  Surgeon: Serafina Mitchell, MD;  Location: [facility];  Service: Cardiovascular;  Laterality: Left;  upper arm venous  . PERIPHERAL VASCULAR CATHETERIZATION Left 06/25/2016   Procedure: Fistulagram;  Surgeon: Serafina Mitchell, MD;  Location: [facility];  Service: Cardiovascular;  Laterality: Left;  . REVISON OF ARTERIOVENOUS FISTULA Left 05/07/2017   Procedure: REPAIR OF PSEUDOANEURYSM  LEFT BRACHIO-CEPHALIC  ARM ARTERIOVENOUS FISTULA;  Surgeon:  Angelia Mould, MD;  Location: [facility];  Service: Vascular;  Laterality: Left;    Allergies: Meperidine; Sulfa antibiotics; Sulfonamide derivatives; Eggs or egg-derived products; Foltx [elfolate plus]; Lac bovis; Lactose intolerance (gi); Milk-related compounds; Penicillins; and Betadine [povidone iodine]  Medications: Prior to Admission medications   Medication Sig Start Date End Date Taking? Authorizing Provider  albuterol (PROVENTIL HFA;VENTOLIN HFA) 108 (90 Base) MCG/ACT inhaler Inhale 1-2 puffs into the lungs every 6 (six) hours as needed for wheezing or shortness of breath. 12/03/16   Kinnie Feil, PA-C  allopurinol (ZYLOPRIM) 300 MG tablet Take 300 mg by mouth daily.     [provider]  amiodarone (PACERONE) 200 MG tablet Take 100 mg by mouth daily.    [provider]  atorvastatin (LIPITOR) 40 MG tablet Take 40 mg by mouth at bedtime.  01/21/16   [provider]  BAYER BREEZE 2 TEST DISK Apply 1 each topically daily as needed (FOR TESTING BLOOD SUGAR).  11/13/15   [provider]  calcitRIOL (ROCALTROL) 0.5 MCG capsule Take 0.5 mcg by mouth Every Tuesday,Thursday,and Saturday with dialysis. Medication given at dialysis on Tues Thurs Sat 11/13/15   [provider]  carvedilol (COREG) 3.125 MG tablet Take 1 tablet (3.125 mg total) by mouth See admin instructions. Take 1 tablet (3.125 mg) by mouth twice daily on Monday, Wednesday, Friday and Sunday (non-dialysis days) Patient not taking: Reported on 05/06/2017 08/14/16   Alvia Grove, PA-C  famotidine (PEPCID) 20 MG tablet Take 1 tablet (20 mg total) by mouth 2 (two) times daily as needed (sour stomach). 08/14/16   Alvia Grove, PA-C  fluticasone (FLONASE) 50 MCG/ACT nasal spray Place 2 sprays into both nostrils daily as needed for allergies or rhinitis.     [provider]  glipiZIDE (GLUCOTROL) 5 MG tablet Take 0.5 tablets (2.5 mg total) by mouth 2 (two) times  daily. Patient not taking: Reported on 04/05/2017 08/14/16   Alvia Grove, PA-C  loratadine-pseudoephedrine (CLARITIN-D 24-HOUR) 10-240 MG 24 hr tablet Take 1 tablet by mouth daily as needed for allergies.    [provider]  multivitamin (RENA-VIT) TABS tablet Take 1 tablet by mouth at bedtime.  12/20/15   [provider]  oxyCODONE-acetaminophen (PERCOCET) 7.5-325 MG tablet Take 1 tablet by mouth 3 (three) times daily as needed (pain). Patient taking differently: Take 1 tablet by mouth every 6 (six) hours as needed (pain).  08/14/16   Alvia Grove, PA-C  oxyCODONE-acetaminophen (ROXICET) 5-325 MG tablet Take 1-2 tablets by mouth every 4 (four) hours as needed. 05/07/17   Angelia Mould, MD  sevelamer carbonate (RENVELA) 800 MG tablet Take 2 tablets (1,600 mg total) by mouth 3 (three) times daily with meals. Patient taking differently: Take 2,400 mg by mouth 3 (three) times daily with meals.  12/18/15   Geradine Girt, DO     Family History  Problem Relation Age of Onset  . Arthritis Mother   . Cancer Father   . Heart disease Father   . Hypertension Sister   . Alcohol abuse Brother   . Diabetes Daughter   .  Heart attack Paternal Uncle     Social History   Social History  . Marital status: Married    Spouse name: N/A  . Number of children: N/A  . Years of education: N/A   Occupational History  . Retired    Social History Main Topics  . Smoking status: Former Smoker    Years: 30.00    Quit date: 10/12/1998  . Smokeless tobacco: Never Used  . Alcohol use No     Comment: Used to drink on the weekends, quit 2 years ago  . Drug use: No  . Sexual activity: No   Other Topics Concern  . Not on file   Social History Narrative   Lives with wife and daughter.     Review of Systems: A 12 point ROS discussed and pertinent positives are indicated in the HPI above.  All other systems are negative.  Review of Systems  Constitutional: Negative.    Respiratory: Negative.   Cardiovascular: Negative.   Gastrointestinal: Negative.   Genitourinary: Negative.   Musculoskeletal: Negative.   Neurological: Negative.     Vital Signs: BP (!) 149/86   Pulse 94   Temp 98.3 F (36.8 C) (Oral)   Resp 17   Ht '5\' 11"'  (1.803 m)   Wt 201 lb (91.2 kg)   SpO2 97%   BMI 28.03 kg/m   Physical Exam  Constitutional: He is oriented to person, place, and time. No distress.  Abdominal: Soft. He exhibits no distension. There is no tenderness. There is no rebound and no guarding.  Musculoskeletal: He exhibits no edema.  Neurological: He is alert and oriented to person, place, and time.  Skin: He is not diaphoretic.     Imaging: Dg Chest Port 1 View  Result Date: 05/07/2017 CLINICAL DATA:  71 year old male status post left dialysis catheter placements EXAM: PORTABLE CHEST 1 VIEW COMPARISON:  Prior chest x-ray 12/09/2016 FINDINGS: Interval removal of the right IJ approach tunneled hemodialysis catheter and placement of a new left-sided IJ approach hemodialysis catheter. The new catheter tip projects over the superior cavoatrial junction. Stable cardiomegaly. No evidence of pneumothorax. Pulmonary vascular congestion with mild edema. Bibasilar atelectasis. No acute osseous abnormality. IMPRESSION: 1. No evidence of pneumothorax or other complicating feature. 2. The tip of the new left IJ approach tunneled hemodialysis catheter projects over the cavoatrial junction in good position. 3. Interval removal of the prior right IJ approach hemodialysis catheter. 4. Cardiomegaly with mild pulmonary edema and bibasilar atelectasis. Electronically Signed   By: Jacqulynn Cadet M.D.   On: 05/07/2017 16:12   Dg Fluoro Guide Cv Line-no Report  Result Date: 05/07/2017 Fluoroscopy was utilized by the requesting physician.  No radiographic interpretation.    Labs:  CBC:  Recent Labs  12/12/16 2110 12/15/16 0349 12/16/16 0722 12/17/16 0806 05/07/17 0957   WBC 7.8 9.8 9.0 7.8  --   HGB 9.2* 9.9* 8.5* 9.2* 10.5*  HCT 28.0* 30.3* 26.0* 28.1* 31.0*  PLT 159 175 157 146*  --     BMP:  Recent Labs  12/16/16 0722 12/17/16 0532 12/18/16 0542 04/05/17 1017 05/07/17 0957  NA 131* 133* 134* 137 141  K 3.5 3.9 4.3 3.9 3.9  CL 93* 95* 95* 97*  --   CO2 '25 30 29 26  ' --   GLUCOSE 187* 170* 184* 81 64*  BUN 27* 16 20 46*  --   CALCIUM 7.6* 7.9* 7.9* 8.8*  --   CREATININE 5.32* 4.54* 4.70* 9.27*  --  GFRNONAA 10* 12* 11* 5*  --   GFRAA 11* 14* 13* 6*  --     LIVER FUNCTION TESTS:  Recent Labs  12/03/16 1450  12/16/16 0722 12/17/16 0532 12/18/16 0542 04/05/17 1017  BILITOT 0.7  --   --   --   --  0.6  AST 27  --   --   --   --  22  ALT 17  --   --   --   --  11*  ALKPHOS 55  --   --   --   --  51  PROT 6.6  --   --   --   --  6.6  ALBUMIN 3.3*  < > 2.5* 2.7* 2.5* 3.4*  < > = values in this interval not displayed.   Assessment and Plan:  I met with Mr. Hagerman and reviewed the unenhanced CT performed on 02/03/2017 with him. This shows further retraction of the hyperdense rounded lesion of the lateral right interpolar kidney now measuring 1.9 cm. This is consistent with a resolving hemorrhagic cyst. There are some other high density cysts bilaterally also likely representing hemorrhagic cysts.  I told Mr. Karge that no further CT or MRI surveillance of the kidneys would be necessary unless he were to develop any new significant symptoms.  He does not need renal biopsy or ablation.  Electronically SignedAletta Edouard T 05/12/2017, 12:06 PM   I spent a total of 15 Minutes in face to face in clinical consultation, greater than 50% of which was counseling/coordinating care for a right renal lesion.

## 2017-06-03 ENCOUNTER — Encounter: Payer: Medicare Other | Admitting: Vascular Surgery

## 2017-06-09 ENCOUNTER — Encounter: Payer: Self-pay | Admitting: Vascular Surgery

## 2017-06-16 ENCOUNTER — Encounter: Payer: Self-pay | Admitting: Vascular Surgery

## 2017-06-16 ENCOUNTER — Ambulatory Visit (INDEPENDENT_AMBULATORY_CARE_PROVIDER_SITE_OTHER): Payer: Self-pay | Admitting: Vascular Surgery

## 2017-06-16 VITALS — BP 128/77 | HR 87 | Temp 99.4°F | Ht 71.0 in | Wt 206.9 lb

## 2017-06-16 DIAGNOSIS — Z48812 Encounter for surgical aftercare following surgery on the circulatory system: Secondary | ICD-10-CM

## 2017-06-16 NOTE — Progress Notes (Signed)
Patient name: Albert Stanley MRN: [id number] DOB: Sep 19, 1946 Sex: male  REASON FOR VISIT:    Follow up  HPI:   Albert Stanley is a pleasant 71 y.o. 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 He presented for possible revision of the fistula versus ligation. I performed repair of the pseudoaneurysm on 05/07/2017 but did not think it would have adequate room to cannulate the fistula and therefore also placed a tunneled dialysis catheter.  The patient is doing well and his catheters functioning well. He has no pain or paresthesias in his left arm.  Current Outpatient Prescriptions  Medication Sig Dispense Refill  . albuterol (PROVENTIL HFA;VENTOLIN HFA) 108 (90 Base) MCG/ACT inhaler Inhale 1-2 puffs into the lungs every 6 (six) hours as needed for wheezing or shortness of breath. 1 Inhaler 0  . allopurinol (ZYLOPRIM) 300 MG tablet Take 300 mg by mouth daily.     Marland Kitchen amiodarone (PACERONE) 200 MG tablet Take 100 mg by mouth daily.    Marland Kitchen atorvastatin (LIPITOR) 40 MG tablet Take 40 mg by mouth at bedtime.   0  . BAYER BREEZE 2 TEST DISK Apply 1 each topically daily as needed (FOR TESTING BLOOD SUGAR).   0  . calcitRIOL (ROCALTROL) 0.5 MCG capsule Take 0.5 mcg by mouth Every Tuesday,Thursday,and Saturday with dialysis. Medication given at dialysis on Tues Thurs Sat  1  . famotidine (PEPCID) 20 MG tablet Take 1 tablet (20 mg total) by mouth 2 (two) times daily as needed (sour stomach).    . fluticasone (FLONASE) 50 MCG/ACT nasal spray Place 2 sprays into both nostrils daily as needed for allergies or rhinitis.     Marland Kitchen glipiZIDE (GLUCOTROL) 5 MG tablet Take 0.5 tablets (2.5 mg total) by mouth 2 (two) times daily. (Patient taking differently: Take 2.5 mg by mouth daily before breakfast. )    . loratadine-pseudoephedrine (CLARITIN-D 24-HOUR) 10-240 MG 24 hr tablet Take 1 tablet by mouth daily as needed for allergies.    . multivitamin  (RENA-VIT) TABS tablet Take 1 tablet by mouth at bedtime.   0  . oxyCODONE-acetaminophen (PERCOCET) 7.5-325 MG tablet Take 1 tablet by mouth 3 (three) times daily as needed (pain). (Patient taking differently: Take 1 tablet by mouth every 6 (six) hours as needed (pain). ) 20 tablet 0  . oxyCODONE-acetaminophen (ROXICET) 5-325 MG tablet Take 1-2 tablets by mouth every 4 (four) hours as needed. 20 tablet 0  . sevelamer carbonate (RENVELA) 800 MG tablet Take 2 tablets (1,600 mg total) by mouth 3 (three) times daily with meals. (Patient taking differently: Take 2,400 mg by mouth 3 (three) times daily with meals. ) 90 tablet 0  . carvedilol (COREG) 3.125 MG tablet Take 1 tablet (3.125 mg total) by mouth See admin instructions. Take 1 tablet (3.125 mg) by mouth twice daily on Monday, Wednesday, Friday and Sunday (non-dialysis days) (Patient not taking: Reported on 05/06/2017)     No current facility-administered medications for this visit.     REVIEW OF SYSTEMS:  [X]  denotes positive finding, [ ]  denotes negative finding Cardiac  Comments:  Chest pain or chest pressure:    Shortness of breath upon exertion:    Short of breath when lying flat:    Irregular heart rhythm:    Constitutional    Fever or chills:     PHYSICAL EXAM:   Vitals:   06/16/17 1402  BP: 128/77  Pulse: 87  Temp: 99.4  F (37.4 C)  TempSrc: Oral  SpO2: 99%  Weight: 206 lb 14.4 oz (93.8 kg)  Height: 5\' 11"  (1.803 m)    GENERAL: The patient is a well-nourished male, in no acute distress. The vital signs are documented above. CARDIOVASCULAR: There is a regular rate and rhythm. PULMONARY: There is good air exchange bilaterally without wheezing or rales. This fistula has a thrill although it is somewhat pulsatile. Incisions are healing nicely.  DATA:   No new data.  MEDICAL ISSUES:   STATUS POST REPAIR OF PSEUDOANEURYSM OF LEFT BRACHIOCEPHALIC AV FISTULA: The patient is doing well status post repair of his large  pseudoaneurysm. At this point it is safe to cannulate his fistula. Once we know that this is working adequately the catheter can be removed.  [name] [facility] [phone]

## 2017-06-26 ENCOUNTER — Encounter (HOSPITAL_COMMUNITY): Payer: Self-pay | Admitting: *Deleted

## 2017-06-26 ENCOUNTER — Emergency Department (HOSPITAL_COMMUNITY)
Admission: EM | Admit: 2017-06-26 | Discharge: 2017-06-26 | Disposition: A | Discharge status: Home / Self Care | Payer: Medicare Other | Attending: Emergency Medicine | Admitting: Emergency Medicine

## 2017-06-26 DIAGNOSIS — Z8551 Personal history of malignant neoplasm of bladder: Secondary | ICD-10-CM | POA: Diagnosis not present

## 2017-06-26 DIAGNOSIS — R1110 Vomiting, unspecified: Secondary | ICD-10-CM | POA: Diagnosis present

## 2017-06-26 DIAGNOSIS — Z79899 Other long term (current) drug therapy: Secondary | ICD-10-CM | POA: Diagnosis not present

## 2017-06-26 DIAGNOSIS — N186 End stage renal disease: Secondary | ICD-10-CM | POA: Diagnosis not present

## 2017-06-26 DIAGNOSIS — Z87891 Personal history of nicotine dependence: Secondary | ICD-10-CM | POA: Insufficient documentation

## 2017-06-26 DIAGNOSIS — J45909 Unspecified asthma, uncomplicated: Secondary | ICD-10-CM | POA: Insufficient documentation

## 2017-06-26 DIAGNOSIS — I5043 Acute on chronic combined systolic (congestive) and diastolic (congestive) heart failure: Secondary | ICD-10-CM | POA: Diagnosis not present

## 2017-06-26 DIAGNOSIS — Z992 Dependence on renal dialysis: Secondary | ICD-10-CM | POA: Diagnosis not present

## 2017-06-26 DIAGNOSIS — N390 Urinary tract infection, site not specified: Secondary | ICD-10-CM | POA: Diagnosis not present

## 2017-06-26 DIAGNOSIS — E1122 Type 2 diabetes mellitus with diabetic chronic kidney disease: Secondary | ICD-10-CM | POA: Diagnosis not present

## 2017-06-26 DIAGNOSIS — I132 Hypertensive heart and chronic kidney disease with heart failure and with stage 5 chronic kidney disease, or end stage renal disease: Secondary | ICD-10-CM | POA: Diagnosis not present

## 2017-06-26 LAB — COMPREHENSIVE METABOLIC PANEL
ALT: 16 U/L — ABNORMAL LOW (ref 17–63)
AST: 25 U/L (ref 15–41)
Albumin: 3.3 g/dL — ABNORMAL LOW (ref 3.5–5.0)
Alkaline Phosphatase: 49 U/L (ref 38–126)
Anion gap: 19 — ABNORMAL HIGH (ref 5–15)
BUN: 46 mg/dL — ABNORMAL HIGH (ref 6–20)
CHLORIDE: 93 mmol/L — AB (ref 101–111)
CO2: 23 mmol/L (ref 22–32)
CREATININE: 9.49 mg/dL — AB (ref 0.61–1.24)
Calcium: 8.6 mg/dL — ABNORMAL LOW (ref 8.9–10.3)
GFR calc non Af Amer: 5 mL/min — ABNORMAL LOW (ref >60)
GFR, EST AFRICAN AMERICAN: 6 mL/min — AB (ref >60)
Glucose, Bld: 66 mg/dL (ref 65–99)
POTASSIUM: 4.5 mmol/L (ref 3.5–5.1)
Sodium: 135 mmol/L (ref 135–145)
Total Bilirubin: 0.8 mg/dL (ref 0.3–1.2)
Total Protein: 6.6 g/dL (ref 6.5–8.1)

## 2017-06-26 LAB — CBC
HCT: 32.6 % — ABNORMAL LOW (ref 39.0–52.0)
Hemoglobin: 10.6 g/dL — ABNORMAL LOW (ref 13.0–17.0)
MCH: 29.9 pg (ref 26.0–34.0)
MCHC: 32.5 g/dL (ref 30.0–36.0)
MCV: 92.1 fL (ref 78.0–100.0)
PLATELETS: 219 10*3/uL (ref 150–400)
RBC: 3.54 MIL/uL — AB (ref 4.22–5.81)
RDW: 14.7 % (ref 11.5–15.5)
WBC: 6.3 10*3/uL (ref 4.0–10.5)

## 2017-06-26 LAB — URINALYSIS, ROUTINE W REFLEX MICROSCOPIC
Bilirubin Urine: NEGATIVE
Glucose, UA: NEGATIVE mg/dL
Ketones, ur: NEGATIVE mg/dL
Nitrite: NEGATIVE
Protein, ur: >=300 mg/dL — AB
SPECIFIC GRAVITY, URINE: 1.015 (ref 1.005–1.030)
Squamous Epithelial / LPF: NONE SEEN
pH: 6 (ref 5.0–8.0)

## 2017-06-26 LAB — CBG MONITORING, ED
GLUCOSE-CAPILLARY: 134 mg/dL — AB (ref 65–99)
Glucose-Capillary: 125 mg/dL — ABNORMAL HIGH (ref 65–99)

## 2017-06-26 LAB — LIPASE, BLOOD: LIPASE: 24 U/L (ref 11–51)

## 2017-06-26 MED ORDER — BISMUTH SUBSALICYLATE 262 MG/15ML PO SUSP
30.0000 mL | Freq: Once | ORAL | Status: AC
  Administered 2017-06-26: 30 mL via ORAL
  Filled 2017-06-26: qty 118

## 2017-06-26 MED ORDER — CEPHALEXIN 250 MG PO CAPS
500.0000 mg | ORAL_CAPSULE | Freq: Once | ORAL | Status: AC
  Administered 2017-06-26: 500 mg via ORAL
  Filled 2017-06-26: qty 2

## 2017-06-26 MED ORDER — CEPHALEXIN 250 MG PO CAPS: 250.0000 mg | ORAL_CAPSULE | Freq: Four times a day (QID) | ORAL | 0 refills | Status: DC

## 2017-06-26 NOTE — ED Provider Notes (Signed)
[facility] DEPT Provider Note   CSN: [id number] Arrival date & time: 06/26/17  1200     History   Chief Complaint Chief Complaint  Patient presents with  . Fever  . Emesis  . Nausea    HPI Albert Stanley is a 71 y.o. male.  Is here for evaluation of an episode of vomiting which started this morning.  It occurred spontaneously and was not associated with diarrhea, cough or chest pain.  He also took his temperature this morning and it was 103.  He did not take anything for the fever.  No known abnormal food ingestions or known sick contacts.  He has a chronic indwelling Foley catheter, and his wife noticed that the urine was cloudy, today.  He did not go to dialysis today because he did not feel well.  No prior similar recent problems.  There are no other known modifying factors.  HPI  Past Medical History:  Diagnosis Date  . Arthritis    Osteoarthritis  . Asthma   . Bladder cancer ([place]) dx'd 1990   surg only  . CHF (congestive heart failure) ([place])   . Diabetes mellitus without complication ([place])   . ESRD (end stage renal disease) ([name])    Tues, Thurs, Sat dialysis  . GERD (gastroesophageal reflux disease)   . History of cardiac catheterization    a. LHC 5/17: no obstructive CAD  . History of DVT (deep vein thrombosis)    2003 ish  . History of nuclear stress test    a. Myoview 4/17: EF 39%, inf, inf-lat, apical inf, apical lat, apical scar, intermediate risk  . Hyperlipidemia   . Hypertension   . NICM (nonischemic cardiomyopathy) ([place])    a. Echo 2/17: mod LVH, EF 35-40%, inf-lat and inf-sept HK, mod MR, severe LAE, small pericardial effusion    Patient Active Problem List   Diagnosis Date Noted  . Malnutrition of moderate degree 12/18/2016  . PVC's (premature ventricular contractions)   . Anemia associated with chronic renal failure   . End-stage renal disease on hemodialysis ([place]) 12/09/2016  . Dyslipidemia associated with type 2 diabetes mellitus ([place])  12/09/2016  . Acute on chronic systolic and diastolic heart failure, NYHA class 1 ([place]) 12/09/2016  . 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 Type 2 diabetes mellitus ([place]) 12/11/2015  . Fluid overload 12/06/2015  . History of DVT (deep vein thrombosis) 12/04/2015  . Renal hematoma 12/04/2015  . H/O: gout 11/16/2013  . Trochanteric bursitis 10/17/2013  . 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 Lumbar degenerative disc disease 07/17/2013    Past Surgical History:  Procedure Laterality Date  . arthroscopic knee surgery Left   . AV FISTULA PLACEMENT Left 12/13/2015   Procedure: ARTERIOVENOUS (AV) FISTULA CREATION;  Surgeon: Angelia Mould, MD;  Location: [facility];  Service: Vascular;  Laterality: Left;  . BACK SURGERY     2 times  1960  . CARDIAC CATHETERIZATION N/A 02/28/2016   Procedure: Left Heart Cath and Coronary Angiography;  Surgeon: Larey Dresser, MD;  Location: [facility];  Service: Cardiovascular;  Laterality: N/A;  . CYSTOSCOPY    . FISTULA SUPERFICIALIZATION Left 08/14/2016   Procedure: FISTULA SUPERFICIALIZATION LEFT UPPER ARM;  Surgeon: Angelia Mould, MD;  Location: [facility];  Service: Vascular;  Laterality: Left;  . INSERTION OF DIALYSIS CATHETER N/A 12/13/2015   Procedure: INSERTION OF DIALYSIS CATHETER;  Surgeon: Angelia Mould, MD;  Location: [facility]  OR;  Service: Vascular;  Laterality: N/A;  . INSERTION OF DIALYSIS CATHETER Left 05/07/2017   Procedure: INSERTION OF DIALYSIS CATHETER LEFT INTERNAL JUGULAR PLACEMENT;  Surgeon: Angelia Mould, MD;  Location: [facility];  Service: Vascular;  Laterality: Left;  . JOINT REPLACEMENT Left    hip  . LIGATION OF COMPETING BRANCHES OF ARTERIOVENOUS FISTULA Left 08/14/2016   Procedure: LIGATION OF COMPETING BRANCHES OF ARTERIOVENOUS FISTULA LEFT UPPER ARM;  Surgeon: Angelia Mould, MD;  Location: [facility];  Service: Vascular;  Laterality: Left;  . PERIPHERAL  VASCULAR CATHETERIZATION Left 06/09/2016   Procedure: Fistulagram;  Surgeon: Serafina Mitchell, MD;  Location: [facility];  Service: Cardiovascular;  Laterality: Left;  ARM  . PERIPHERAL VASCULAR CATHETERIZATION Left 06/09/2016   Procedure: Peripheral Vascular Balloon Angioplasty;  Surgeon: Serafina Mitchell, MD;  Location: [facility];  Service: Cardiovascular;  Laterality: Left;  upper arm venous  . PERIPHERAL VASCULAR CATHETERIZATION Left 06/25/2016   Procedure: Fistulagram;  Surgeon: Serafina Mitchell, MD;  Location: [facility];  Service: Cardiovascular;  Laterality: Left;  . REVISON OF ARTERIOVENOUS FISTULA Left 05/07/2017   Procedure: REPAIR OF PSEUDOANEURYSM  LEFT BRACHIO-CEPHALIC  ARM ARTERIOVENOUS FISTULA;  Surgeon: Angelia Mould, MD;  Location: [facility];  Service: Vascular;  Laterality: Left;       Home Medications    Prior to Admission medications   Medication Sig Start Date End Date Taking? Authorizing Provider  albuterol (PROVENTIL HFA;VENTOLIN HFA) 108 (90 Base) MCG/ACT inhaler Inhale 1-2 puffs into the lungs every 6 (six) hours as needed for wheezing or shortness of breath. 12/03/16  Yes Carmon Sails J, PA-C  allopurinol (ZYLOPRIM) 300 MG tablet Take 300 mg by mouth daily.    Yes [provider]  amiodarone (PACERONE) 200 MG tablet Take 200 mg by mouth daily.    Yes [provider]  ASPERCREME LIDOCAINE EX Apply 1 application topically 3 (three) times daily as needed (pain).   Yes [provider]  atorvastatin (LIPITOR) 40 MG tablet Take 40 mg by mouth at bedtime.  01/21/16  Yes [provider]  calcitRIOL (ROCALTROL) 0.5 MCG capsule Take 0.5 mcg by mouth Every Tuesday,Thursday,and Saturday with dialysis. Medication given at dialysis on Tues Thurs Sat 11/13/15  Yes [provider]  diphenhydrAMINE (BENADRYL) 25 MG tablet Take 12.5 mg by mouth 2 (two) times daily as needed for itching or allergies.   Yes [provider]  famotidine (PEPCID) 20 MG tablet Take 1 tablet (20 mg total) by mouth 2 (two) times daily as needed (sour stomach). 08/14/16  Yes Alvia Grove, PA-C  fluticasone (FLONASE) 50 MCG/ACT nasal spray Place 2 sprays into both nostrils daily as needed for allergies or rhinitis.    Yes [provider]  glipiZIDE (GLUCOTROL) 5 MG tablet Take 0.5 tablets (2.5 mg total) by mouth 2 (two) times daily. Patient taking differently: Take 2.5 mg by mouth daily before breakfast.  08/14/16  Yes Trinh, Janalyn Harder, PA-C  loratadine-pseudoephedrine (CLARITIN-D 24-HOUR) 10-240 MG 24 hr tablet Take 1 tablet by mouth daily as needed for allergies.   Yes [provider]  Menthol, Topical Analgesic, (ICY HOT EX) Apply 1 application topically 3 (three) times daily as needed (pain).   Yes [provider]  multivitamin (RENA-VIT) TABS tablet Take 1 tablet by mouth at bedtime.  12/20/15  Yes [provider]  Naphazoline-Pheniramine (VISINE-A OP) Place 1 drop into both eyes 2 (two) times daily as needed (  dry eyes/ allergies).   Yes [provider]  oxyCODONE-acetaminophen (PERCOCET) 7.5-325 MG tablet Take 1 tablet by mouth 3 (three) times daily as needed (pain). Patient taking differently: Take 1 tablet by mouth 4 (four) times daily as needed (pain).  08/14/16  Yes Virgina Jock A, PA-C  sevelamer carbonate (RENVELA) 800 MG tablet Take 2 tablets (1,600 mg total) by mouth 3 (three) times daily with meals. Patient taking differently: Take 2,400 mg by mouth See admin instructions. Take 3 tablets (2400 mg) by mouth daily with meals - usually twice daily 12/18/15  Yes Vann, Jessica U, DO  BAYER BREEZE 2 TEST DISK Apply 1 each topically daily as needed (FOR TESTING BLOOD SUGAR).  11/13/15   [provider]  carvedilol (COREG) 3.125 MG tablet Take 1 tablet (3.125 mg total) by mouth See admin instructions. Take 1 tablet (3.125 mg) by mouth twice daily on Monday, Wednesday,  Friday and Sunday (non-dialysis days) 08/14/16   Alvia Grove, PA-C  cephALEXin (KEFLEX) 250 MG capsule Take 1 capsule (250 mg total) by mouth 4 (four) times daily. 06/26/17   Daleen Bo, MD  oxyCODONE-acetaminophen (ROXICET) 5-325 MG tablet Take 1-2 tablets by mouth every 4 (four) hours as needed. Patient not taking: Reported on 06/26/2017 05/07/17   Angelia Mould, MD    Family History Family History  Problem Relation Age of Onset  . Arthritis Mother   . Cancer Father   . Heart disease Father   . Hypertension Sister   . Alcohol abuse Brother   . Diabetes Daughter   . Heart attack Paternal Uncle     Social History Social History  Substance Use Topics  . Smoking status: Former Smoker    Years: 30.00    Quit date: 10/12/1998  . Smokeless tobacco: Never Used  . Alcohol use No     Comment: Used to drink on the weekends, quit 2 years ago     Allergies   Meperidine; Sulfa antibiotics; Sulfonamide derivatives; Eggs or egg-derived products; Foltx [elfolate plus]; Lac bovis; Lactose intolerance (gi); Milk-related compounds; Penicillins; and Betadine [povidone iodine]   Review of Systems Review of Systems  All other systems reviewed and are negative.    Physical Exam Updated Vital Signs BP (!) 141/93   Pulse 95   Temp 99.4 F (37.4 C) (Rectal)   Resp 20   SpO2 100%   Physical Exam  Constitutional: He is oriented to person, place, and time. He appears well-developed and well-nourished. No distress.  HENT:  Head: Normocephalic and atraumatic.  Right Ear: External ear normal.  Left Ear: External ear normal.  Eyes: Pupils are equal, round, and reactive to light. Conjunctivae and EOM are normal.  Neck: Normal range of motion and phonation normal. Neck supple.  Cardiovascular: Normal rate, regular rhythm and normal heart sounds.   Vascular fistula left upper arm has normal thrill.  Pulmonary/Chest: Effort normal and breath sounds normal. No respiratory distress.  He exhibits no bony tenderness.  Abdominal: Soft. He exhibits no distension and no mass. There is no tenderness.  Genitourinary:  Genitourinary Comments: Urine in Foley catheter bag is cloudy  Musculoskeletal: Normal range of motion.  Neurological: He is alert and oriented to person, place, and time. No cranial nerve deficit or sensory deficit. He exhibits normal muscle tone. Coordination normal.  Skin: Skin is warm, dry and intact.  Psychiatric: He has a normal mood and affect. His behavior is normal. Judgment and thought content normal.  Nursing note and vitals reviewed.  ED Treatments / Results  Labs (all labs ordered are listed, but only abnormal results are displayed) Labs Reviewed  COMPREHENSIVE METABOLIC PANEL - Abnormal; Notable for the following:       Result Value   Chloride 93 (*)    BUN 46 (*)    Creatinine, Ser 9.49 (*)    Calcium 8.6 (*)    Albumin 3.3 (*)    ALT 16 (*)    GFR calc non Af Amer 5 (*)    GFR calc Af Amer 6 (*)    Anion gap 19 (*)    All other components within normal limits  CBC - Abnormal; Notable for the following:    RBC 3.54 (*)    Hemoglobin 10.6 (*)    HCT 32.6 (*)    All other components within normal limits  URINALYSIS, ROUTINE W REFLEX MICROSCOPIC - Abnormal; Notable for the following:    APPearance TURBID (*)    Hgb urine dipstick LARGE (*)    Protein, ur >=300 (*)    Leukocytes, UA MODERATE (*)    Bacteria, UA FEW (*)    All other components within normal limits  CBG MONITORING, ED - Abnormal; Notable for the following:    Glucose-Capillary 134 (*)    All other components within normal limits  CBG MONITORING, ED - Abnormal; Notable for the following:    Glucose-Capillary 125 (*)    All other components within normal limits  URINE CULTURE  LIPASE, BLOOD    EKG  EKG Interpretation None       Radiology No results found.  Procedures Procedures (including critical care time)  Medications Ordered in ED Medications    bismuth subsalicylate (PEPTO BISMOL) 262 MG/15ML suspension 30 mL (30 mLs Oral Given 06/26/17 1729)  cephALEXin (KEFLEX) capsule 500 mg (500 mg Oral Given 06/26/17 1841)     Initial Impression / Assessment and Plan / ED Course  I have reviewed the triage vital signs and the nursing notes.  Pertinent labs & imaging results that were available during my care of the patient were reviewed by me and considered in my medical decision making (see chart for details).      Patient Vitals for the past 24 hrs:  BP Temp Temp src Pulse Resp SpO2  06/26/17 1915 (!) 141/93 - - 95 - 100 %  06/26/17 1845 130/86 - - 89 - 95 %  06/26/17 1830 123/83 - - 87 - 93 %  06/26/17 1815 135/88 - - 90 - 97 %  06/26/17 1800 126/83 - - 89 - 94 %  06/26/17 1745 136/89 - - 90 - 96 %  06/26/17 1730 - 99.4 F (37.4 C) Rectal - - -  06/26/17 1645 132/81 - - 88 - 92 %  06/26/17 1630 137/89 - - 99 - 100 %  06/26/17 1545 124/88 - - 92 - 99 %  06/26/17 1515 129/84 - - 89 - 96 %  06/26/17 1445 129/72 - - 91 - 96 %  06/26/17 1411 122/70 98.4 F (36.9 C) Oral 92 20 97 %  06/26/17 1207 (!) 138/91 99.5 F (37.5 C) Oral - 16 97 %    7:29 PM Reevaluation with update and discussion. After initial assessment and treatment, an updated evaluation reveals patient is comfortable.  He was treated with Pepto-Bismol, for "gas."  Findings discussed with the patient and all questions were answered. Vinton Layson L     Final Clinical Impressions(s) / ED Diagnoses   Final diagnoses:  Urinary  tract infection without hematuria, site unspecified    Fever with vomiting likely secondary to urinary tract infection.  Patient missed dialysis today but is hemodynamically stable without evidence for frank fluid overload, potassium abnormality, cardiac arrhythmia or impending vascular collapse.  Nursing Notes Reviewed/ Care Coordinated Applicable Imaging Reviewed Interpretation of Laboratory Data incorporated into ED treatment  The  patient appears reasonably screened and/or stabilized for discharge and I doubt any other medical condition or other [facility] requiring further screening, evaluation, or treatment in the ED at this time prior to discharge.  Plan: Home Medications-continue current medications; Home Treatments-rest, fluids; return here if the recommended treatment, does not improve the symptoms; Recommended follow up-PCP checkup 1 week.  Catheter change next week as planned   New Prescriptions New Prescriptions   CEPHALEXIN (KEFLEX) 250 MG CAPSULE    Take 1 capsule (250 mg total) by mouth 4 (four) times daily.     Daleen Bo, MD 06/26/17 [phone]

## 2017-06-26 NOTE — ED Triage Notes (Signed)
To ED for eval of fever, nausea, and vomiting since 0300 today. Pt is due to dialysis today. States he felt fine yesterday. Seen at [facility] and told to come here. Pt has urine leg bag that was not tested at [facility]. Pt alert and oriented - states he feels nauseated but without pain.

## 2017-06-26 NOTE — ED Notes (Signed)
Patient is afebrile and has not been medicated.  Denies nausea at this time.

## 2017-06-26 NOTE — Discharge Instructions (Signed)
Follow-up for your usual dialysis treatment, next Tuesday.  If you begin to be more short of breath or have other difficulties on Monday, call for an appointment to get dialysis, that day.  Take Tylenol every 4 hours as needed for fever.  Return here, if needed, for problems.

## 2017-06-28 LAB — URINE CULTURE

## 2017-06-29 ENCOUNTER — Telehealth: Payer: Self-pay | Admitting: Emergency Medicine

## 2017-06-29 NOTE — Progress Notes (Signed)
ED Antimicrobial Stewardship Positive Culture Follow Up   Albert Stanley is an 71 y.o. male who presented to [facility] on 06/26/2017 with a chief complaint of  Chief Complaint  Patient presents with  . Fever  . Emesis  . Nausea    Recent Results (from the past 720 hour(s))  Urine culture     Status: Abnormal   Collection Time: 06/26/17  4:52 PM  Result Value Ref Range Status   Specimen Description URINE, CATHETERIZED  Final   Special Requests NONE  Final   Culture >=100,000 COLONIES/mL SERRATIA MARCESCENS (A)  Final   Report Status 06/28/2017 FINAL  Final   Organism ID, Bacteria SERRATIA MARCESCENS (A)  Final      Susceptibility   Serratia marcescens - MIC*    CEFAZOLIN >=64 RESISTANT Resistant     CEFTRIAXONE <=1 SENSITIVE Sensitive     CIPROFLOXACIN <=0.25 SENSITIVE Sensitive     GENTAMICIN <=1 SENSITIVE Sensitive     NITROFURANTOIN 128 RESISTANT Resistant     TRIMETH/SULFA <=20 SENSITIVE Sensitive     * >=100,000 COLONIES/mL SERRATIA MARCESCENS    [x]  Treated with cephalexin , organism resistant to prescribed antimicrobial []  Patient discharged originally without antimicrobial agent and treatment is now indicated  New antibiotic prescription: Ciprofloxacin 500 mg PO daily for 7 days   ED Provider: Delia Heady PA-C   Duayne Cal 06/29/2017, 10:48 AM Infectious Diseases Pharmacist Phone# [phone]

## 2017-06-29 NOTE — Telephone Encounter (Signed)
Post ED Visit - Positive Culture Follow-up: Successful Patient Follow-Up  Culture assessed and recommendations reviewed by: []  Elenor Quinones, Pharm.D. []  Heide Guile, Pharm.D., BCPS AQ-ID []  Parks Neptune, Pharm.D., BCPS []  Alycia Rossetti, Pharm.D., BCPS []  [place], Pharm.D., BCPS, AAHIVP []  Legrand Como, Pharm.D., BCPS, AAHIVP []  Salome Arnt, PharmD, BCPS []  Dimitri Ped, PharmD, BCPS [x]  Vincenza Hews, PharmD, BCPS  Positive urine culture  []  Patient discharged without antimicrobial prescription and treatment is now indicated [x]  Organism is resistant to prescribed ED discharge antimicrobial []  Patient with positive blood cultures  Changes discussed with ED provider: Delia Heady PA New antibiotic prescription stop Cephalexin, start Ciprofloxacin 500mg  po daily x 7 days Called to [facility] [place] patient, 06/29/2017 1340   Hazle Nordmann 06/29/2017, 1:38 PM

## 2017-07-03 ENCOUNTER — Emergency Department (HOSPITAL_COMMUNITY): Payer: No Typology Code available for payment source

## 2017-07-03 ENCOUNTER — Encounter (HOSPITAL_COMMUNITY): Payer: Self-pay | Admitting: Emergency Medicine

## 2017-07-03 ENCOUNTER — Emergency Department (HOSPITAL_COMMUNITY)
Admission: EM | Admit: 2017-07-03 | Discharge: 2017-07-04 | Disposition: A | Discharge status: Home / Self Care | Payer: No Typology Code available for payment source | Attending: Emergency Medicine | Admitting: Emergency Medicine

## 2017-07-03 DIAGNOSIS — R413 Other amnesia: Secondary | ICD-10-CM | POA: Diagnosis not present

## 2017-07-03 DIAGNOSIS — T148XXA Other injury of unspecified body region, initial encounter: Secondary | ICD-10-CM | POA: Insufficient documentation

## 2017-07-03 DIAGNOSIS — I132 Hypertensive heart and chronic kidney disease with heart failure and with stage 5 chronic kidney disease, or end stage renal disease: Secondary | ICD-10-CM | POA: Diagnosis not present

## 2017-07-03 DIAGNOSIS — I5042 Chronic combined systolic (congestive) and diastolic (congestive) heart failure: Secondary | ICD-10-CM | POA: Diagnosis not present

## 2017-07-03 DIAGNOSIS — Z87891 Personal history of nicotine dependence: Secondary | ICD-10-CM | POA: Insufficient documentation

## 2017-07-03 DIAGNOSIS — Y92410 Unspecified street and highway as the place of occurrence of the external cause: Secondary | ICD-10-CM | POA: Diagnosis not present

## 2017-07-03 DIAGNOSIS — Z992 Dependence on renal dialysis: Secondary | ICD-10-CM | POA: Diagnosis not present

## 2017-07-03 DIAGNOSIS — E1122 Type 2 diabetes mellitus with diabetic chronic kidney disease: Secondary | ICD-10-CM | POA: Diagnosis not present

## 2017-07-03 DIAGNOSIS — N186 End stage renal disease: Secondary | ICD-10-CM | POA: Diagnosis not present

## 2017-07-03 DIAGNOSIS — R0789 Other chest pain: Secondary | ICD-10-CM

## 2017-07-03 DIAGNOSIS — Z8551 Personal history of malignant neoplasm of bladder: Secondary | ICD-10-CM | POA: Insufficient documentation

## 2017-07-03 DIAGNOSIS — Y9389 Activity, other specified: Secondary | ICD-10-CM | POA: Diagnosis not present

## 2017-07-03 DIAGNOSIS — T07XXXA Unspecified multiple injuries, initial encounter: Secondary | ICD-10-CM

## 2017-07-03 DIAGNOSIS — Y998 Other external cause status: Secondary | ICD-10-CM | POA: Insufficient documentation

## 2017-07-03 LAB — I-STAT CHEM 8, ED
BUN: 42 mg/dL — AB (ref 6–20)
CREATININE: 8.3 mg/dL — AB (ref 0.61–1.24)
Calcium, Ion: 1.01 mmol/L — ABNORMAL LOW (ref 1.15–1.40)
Chloride: 95 mmol/L — ABNORMAL LOW (ref 101–111)
Glucose, Bld: 91 mg/dL (ref 65–99)
HEMATOCRIT: 36 % — AB (ref 39.0–52.0)
Hemoglobin: 12.2 g/dL — ABNORMAL LOW (ref 13.0–17.0)
POTASSIUM: 3.9 mmol/L (ref 3.5–5.1)
Sodium: 137 mmol/L (ref 135–145)
TCO2: 30 mmol/L (ref 22–32)

## 2017-07-03 LAB — CBG MONITORING, ED
Glucose-Capillary: 80 mg/dL (ref 65–99)
Glucose-Capillary: 67 mg/dL (ref 65–99)

## 2017-07-03 MED ORDER — IOPAMIDOL (ISOVUE-300) INJECTION 61%
INTRAVENOUS | Status: AC
  Administered 2017-07-03: 100 mL via INTRAVENOUS
  Filled 2017-07-03: qty 100

## 2017-07-03 MED ORDER — SODIUM CHLORIDE 0.9 % IV SOLN: INTRAVENOUS | Status: DC
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
## 2017-07-03 NOTE — ED Notes (Signed)
[employer] at bedside.  Patient taken by EMT to restroom in wheelchair, able to ambulate from wheelchair to restroom and toilet independently.

## 2017-07-03 NOTE — ED Notes (Signed)
Patient transported to CT 

## 2017-07-03 NOTE — Discharge Instructions (Signed)
Expect to be sore and stiff for the next 2 days. CT scan head neck chest abdomen pelvis and face without any acute injuries. Keep your dialysis schedule. Follow-up with your doctor. Return for any new or worse symptoms. The Percocet that you have at home as needed for pain.

## 2017-07-03 NOTE — ED Notes (Signed)
Pharmacy at bedside
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
## 2017-07-12 ENCOUNTER — Other Ambulatory Visit (HOSPITAL_COMMUNITY): Payer: Self-pay | Admitting: Adult Health

## 2017-07-26 ENCOUNTER — Encounter: Payer: Self-pay | Admitting: Interventional Radiology

## 2017-08-24 ENCOUNTER — Other Ambulatory Visit (HOSPITAL_COMMUNITY): Payer: Self-pay | Admitting: *Deleted

## 2017-08-24 MED ORDER — AMIODARONE HCL 200 MG PO TABS: 200.0000 mg | ORAL_TABLET | Freq: Every day | ORAL | 0 refills | Status: DC

## 2017-09-24 ENCOUNTER — Other Ambulatory Visit (HOSPITAL_COMMUNITY): Payer: Self-pay | Admitting: *Deleted

## 2017-09-24 MED ORDER — AMIODARONE HCL 200 MG PO TABS: 200.0000 mg | ORAL_TABLET | Freq: Every day | ORAL | 3 refills | Status: DC

## 2017-12-02 ENCOUNTER — Other Ambulatory Visit: Payer: Self-pay

## 2017-12-02 ENCOUNTER — Non-Acute Institutional Stay (HOSPITAL_COMMUNITY)
Admission: EM | Admit: 2017-12-02 | Discharge: 2017-12-02 | Disposition: A | Discharge status: Home / Self Care | Payer: Medicare Other | Source: Home / Self Care | Attending: Emergency Medicine | Admitting: Emergency Medicine

## 2017-12-02 ENCOUNTER — Emergency Department (HOSPITAL_COMMUNITY): Payer: Medicare Other

## 2017-12-02 ENCOUNTER — Encounter (HOSPITAL_COMMUNITY): Payer: Self-pay | Admitting: *Deleted

## 2017-12-02 DIAGNOSIS — E785 Hyperlipidemia, unspecified: Secondary | ICD-10-CM

## 2017-12-02 DIAGNOSIS — Z8249 Family history of ischemic heart disease and other diseases of the circulatory system: Secondary | ICD-10-CM | POA: Insufficient documentation

## 2017-12-02 DIAGNOSIS — E875 Hyperkalemia: Secondary | ICD-10-CM

## 2017-12-02 DIAGNOSIS — M1712 Unilateral primary osteoarthritis, left knee: Secondary | ICD-10-CM

## 2017-12-02 DIAGNOSIS — Z91012 Allergy to eggs: Secondary | ICD-10-CM

## 2017-12-02 DIAGNOSIS — N186 End stage renal disease: Secondary | ICD-10-CM | POA: Insufficient documentation

## 2017-12-02 DIAGNOSIS — I5042 Chronic combined systolic (congestive) and diastolic (congestive) heart failure: Secondary | ICD-10-CM

## 2017-12-02 DIAGNOSIS — I890 Lymphedema, not elsewhere classified: Secondary | ICD-10-CM | POA: Insufficient documentation

## 2017-12-02 DIAGNOSIS — R9431 Abnormal electrocardiogram [ECG] [EKG]: Secondary | ICD-10-CM | POA: Insufficient documentation

## 2017-12-02 DIAGNOSIS — J45909 Unspecified asthma, uncomplicated: Secondary | ICD-10-CM

## 2017-12-02 DIAGNOSIS — Z992 Dependence on renal dialysis: Secondary | ICD-10-CM

## 2017-12-02 DIAGNOSIS — I447 Left bundle-branch block, unspecified: Secondary | ICD-10-CM

## 2017-12-02 DIAGNOSIS — Z87891 Personal history of nicotine dependence: Secondary | ICD-10-CM

## 2017-12-02 DIAGNOSIS — Z8551 Personal history of malignant neoplasm of bladder: Secondary | ICD-10-CM | POA: Insufficient documentation

## 2017-12-02 DIAGNOSIS — Z880 Allergy status to penicillin: Secondary | ICD-10-CM | POA: Insufficient documentation

## 2017-12-02 DIAGNOSIS — Z7984 Long term (current) use of oral hypoglycemic drugs: Secondary | ICD-10-CM | POA: Insufficient documentation

## 2017-12-02 DIAGNOSIS — K219 Gastro-esophageal reflux disease without esophagitis: Secondary | ICD-10-CM

## 2017-12-02 DIAGNOSIS — I6201 Nontraumatic acute subdural hemorrhage: Secondary | ICD-10-CM | POA: Diagnosis not present

## 2017-12-02 DIAGNOSIS — Z86718 Personal history of other venous thrombosis and embolism: Secondary | ICD-10-CM

## 2017-12-02 DIAGNOSIS — I440 Atrioventricular block, first degree: Secondary | ICD-10-CM | POA: Insufficient documentation

## 2017-12-02 DIAGNOSIS — Z888 Allergy status to other drugs, medicaments and biological substances status: Secondary | ICD-10-CM | POA: Insufficient documentation

## 2017-12-02 DIAGNOSIS — E1122 Type 2 diabetes mellitus with diabetic chronic kidney disease: Secondary | ICD-10-CM

## 2017-12-02 DIAGNOSIS — Z885 Allergy status to narcotic agent status: Secondary | ICD-10-CM

## 2017-12-02 DIAGNOSIS — R531 Weakness: Secondary | ICD-10-CM

## 2017-12-02 DIAGNOSIS — I132 Hypertensive heart and chronic kidney disease with heart failure and with stage 5 chronic kidney disease, or end stage renal disease: Secondary | ICD-10-CM

## 2017-12-02 DIAGNOSIS — Z91011 Allergy to milk products: Secondary | ICD-10-CM

## 2017-12-02 DIAGNOSIS — Z79891 Long term (current) use of opiate analgesic: Secondary | ICD-10-CM | POA: Insufficient documentation

## 2017-12-02 DIAGNOSIS — Z79899 Other long term (current) drug therapy: Secondary | ICD-10-CM | POA: Insufficient documentation

## 2017-12-02 DIAGNOSIS — Z882 Allergy status to sulfonamides status: Secondary | ICD-10-CM | POA: Insufficient documentation

## 2017-12-02 DIAGNOSIS — I428 Other cardiomyopathies: Secondary | ICD-10-CM | POA: Insufficient documentation

## 2017-12-02 LAB — CBG MONITORING, ED
GLUCOSE-CAPILLARY: 75 mg/dL (ref 65–99)
GLUCOSE-CAPILLARY: 80 mg/dL (ref 65–99)

## 2017-12-02 LAB — CBC
HCT: 27.9 % — ABNORMAL LOW (ref 39.0–52.0)
Hemoglobin: 9.4 g/dL — ABNORMAL LOW (ref 13.0–17.0)
MCH: 31.1 pg (ref 26.0–34.0)
MCHC: 33.7 g/dL (ref 30.0–36.0)
MCV: 92.4 fL (ref 78.0–100.0)
PLATELETS: 179 10*3/uL (ref 150–400)
RBC: 3.02 MIL/uL — AB (ref 4.22–5.81)
RDW: 16 % — AB (ref 11.5–15.5)
WBC: 6.3 10*3/uL (ref 4.0–10.5)

## 2017-12-02 LAB — BASIC METABOLIC PANEL
Anion gap: 21 — ABNORMAL HIGH (ref 5–15)
BUN: 96 mg/dL — AB (ref 6–20)
CALCIUM: 9.1 mg/dL (ref 8.9–10.3)
CHLORIDE: 98 mmol/L — AB (ref 101–111)
CO2: 19 mmol/L — AB (ref 22–32)
CREATININE: 12.99 mg/dL — AB (ref 0.61–1.24)
GFR calc Af Amer: 4 mL/min — ABNORMAL LOW (ref >60)
GFR calc non Af Amer: 3 mL/min — ABNORMAL LOW (ref >60)
Glucose, Bld: 74 mg/dL (ref 65–99)
Potassium: 6.6 mmol/L (ref 3.5–5.1)
SODIUM: 138 mmol/L (ref 135–145)

## 2017-12-02 MED ORDER — INSULIN ASPART 100 UNIT/ML ~~LOC~~ SOLN
5.0000 [IU] | Freq: Once | SUBCUTANEOUS | Status: DC
  Filled 2017-12-02: qty 1

## 2017-12-02 MED ORDER — SODIUM CHLORIDE 0.9 % IV SOLN
1.0000 g | Freq: Once | INTRAVENOUS | Status: AC
  Administered 2017-12-02: 1 g via INTRAVENOUS
  Filled 2017-12-02: qty 10

## 2017-12-02 MED ORDER — DEXTROSE 50 % IV SOLN
1.0000 | Freq: Once | INTRAVENOUS | Status: DC
  Filled 2017-12-02: qty 50

## 2017-12-02 NOTE — ED Provider Notes (Signed)
[place] EMERGENCY DEPARTMENT Provider Note   CSN: [id number] Arrival date & time: 12/02/17  1001     History   Chief Complaint Chief Complaint  Patient presents with  . Leg Swelling  . Weakness    HPI Albert Stanley is a 72 y.o. male.  HPI   72 year old male with a history of end-stage renal disease presents today with complaints of fatigue.  Patient notes that he is a Tuesday Thursday Saturday dialysis patient at the [facility] dialysis center on [address].  He notes that his last dialysis was Saturday (5 days ago).  Patient notes that he missed his Tuesday session as he was having some fatigue and diarrhea.  Patient notes that he has had progressive fatigue.  He denies any chest pain or shortness of breath, he reports chronic lower extremity swelling or edema secondary to lymphedema.  Patient denies any fever chills nausea or vomiting, muscle cramps.   Past Medical History:  Diagnosis Date  . Arthritis    Osteoarthritis  . Asthma   . Bladder cancer ([place]) dx'd 1990   surg only  . CHF (congestive heart failure) ([place])   . Diabetes mellitus without complication ([place])   . ESRD (end stage renal disease) ([name])    Tues, Thurs, Sat dialysis  . GERD (gastroesophageal reflux disease)   . History of cardiac catheterization    a. LHC 5/17: no obstructive CAD  . History of DVT (deep vein thrombosis)    2003 ish  . History of nuclear stress test    a. Myoview 4/17: EF 39%, inf, inf-lat, apical inf, apical lat, apical scar, intermediate risk  . Hyperlipidemia   . Hypertension   . NICM (nonischemic cardiomyopathy) ([place])    a. Echo 2/17: mod LVH, EF 35-40%, inf-lat and inf-sept HK, mod MR, severe LAE, small pericardial effusion    Patient Active Problem List   Diagnosis Date Noted  . Malnutrition of moderate degree 12/18/2016  . PVC's (premature ventricular contractions)   . Anemia associated with chronic renal failure   . End-stage renal disease on  hemodialysis ([place]) 12/09/2016  . Dyslipidemia associated with type 2 diabetes mellitus ([place]) 12/09/2016  . Acute on chronic systolic and diastolic heart failure, NYHA class 1 ([place]) 12/09/2016  . 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 Type 2 diabetes mellitus ([place]) 12/11/2015  . Fluid overload 12/06/2015  . History of DVT (deep vein thrombosis) 12/04/2015  . Renal hematoma 12/04/2015  . H/O: gout 11/16/2013  . Trochanteric bursitis 10/17/2013  . 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 Lumbar degenerative disc disease 07/17/2013    Past Surgical History:  Procedure Laterality Date  . arthroscopic knee surgery Left   . AV FISTULA PLACEMENT Left 12/13/2015   Procedure: ARTERIOVENOUS (AV) FISTULA CREATION;  Surgeon: Angelia Mould, MD;  Location: [place];  Service: Vascular;  Laterality: Left;  . BACK SURGERY     2 times  1960  . CARDIAC CATHETERIZATION N/A 02/28/2016   Procedure: Left Heart Cath and Coronary Angiography;  Surgeon: Larey Dresser, MD;  Location: [place] CV [facility];  Service: Cardiovascular;  Laterality: N/A;  . CYSTOSCOPY    . FISTULA SUPERFICIALIZATION Left 08/14/2016   Procedure: FISTULA SUPERFICIALIZATION LEFT UPPER ARM;  Surgeon: Angelia Mould, MD;  Location: [place];  Service: Vascular;  Laterality: Left;  . INSERTION OF DIALYSIS CATHETER N/A 12/13/2015   Procedure: INSERTION OF DIALYSIS CATHETER;  Surgeon: Judeth Cornfield  Scot Dock, MD;  Location: [place];  Service: Vascular;  Laterality: N/A;  . INSERTION OF DIALYSIS CATHETER Left 05/07/2017   Procedure: INSERTION OF DIALYSIS CATHETER LEFT INTERNAL JUGULAR PLACEMENT;  Surgeon: Angelia Mould, MD;  Location: [place];  Service: Vascular;  Laterality: Left;  . IR RADIOLOGIST EVAL & MGMT  05/12/2017  . JOINT REPLACEMENT Left    hip  . LIGATION OF COMPETING BRANCHES OF ARTERIOVENOUS FISTULA Left 08/14/2016   Procedure: LIGATION OF COMPETING BRANCHES OF  ARTERIOVENOUS FISTULA LEFT UPPER ARM;  Surgeon: Angelia Mould, MD;  Location: [place];  Service: Vascular;  Laterality: Left;  . PERIPHERAL VASCULAR CATHETERIZATION Left 06/09/2016   Procedure: Fistulagram;  Surgeon: Serafina Mitchell, MD;  Location: [place] [facility];  Service: Cardiovascular;  Laterality: Left;  ARM  . PERIPHERAL VASCULAR CATHETERIZATION Left 06/09/2016   Procedure: Peripheral Vascular Balloon Angioplasty;  Surgeon: Serafina Mitchell, MD;  Location: [place] [facility];  Service: Cardiovascular;  Laterality: Left;  upper arm venous  . PERIPHERAL VASCULAR CATHETERIZATION Left 06/25/2016   Procedure: Fistulagram;  Surgeon: Serafina Mitchell, MD;  Location: [place] [facility];  Service: Cardiovascular;  Laterality: Left;  . REVISON OF ARTERIOVENOUS FISTULA Left 05/07/2017   Procedure: REPAIR OF PSEUDOANEURYSM  LEFT BRACHIO-CEPHALIC  ARM ARTERIOVENOUS FISTULA;  Surgeon: Angelia Mould, MD;  Location: [place];  Service: Vascular;  Laterality: Left;       Home Medications    Prior to Admission medications   Medication Sig Start Date End Date Taking? Authorizing Provider  albuterol (PROVENTIL HFA;VENTOLIN HFA) 108 (90 Base) MCG/ACT inhaler Inhale 1-2 puffs into the lungs every 6 (six) hours as needed for wheezing or shortness of breath. 12/03/16   Kinnie Feil, PA-C  allopurinol (ZYLOPRIM) 300 MG tablet Take 300 mg by mouth daily.     [provider]  amiodarone (PACERONE) 200 MG tablet Take 1 tablet (200 mg total) by mouth daily. 09/24/17   Larey Dresser, MD  ASPERCREME LIDOCAINE EX Apply 1 application topically 3 (three) times daily as needed (pain).    [provider]  atorvastatin (LIPITOR) 40 MG tablet Take 40 mg by mouth at bedtime.  01/21/16   [provider]  BAYER BREEZE 2 TEST DISK Apply 1 each topically daily as needed (FOR TESTING BLOOD SUGAR).  11/13/15   [provider]  calcitRIOL (ROCALTROL) 0.5 MCG capsule Take 0.5 mcg by  mouth Every Tuesday,Thursday,and Saturday with dialysis. Medication given at dialysis on Tues Thurs Sat 11/13/15   [provider]  carvedilol (COREG) 3.125 MG tablet Take 1 tablet (3.125 mg total) by mouth See admin instructions. Take 1 tablet (3.125 mg) by mouth twice daily on Monday, Wednesday, Friday and Sunday (non-dialysis days) 08/14/16   Alvia Grove, PA-C  cephALEXin (KEFLEX) 250 MG capsule Take 1 capsule (250 mg total) by mouth 4 (four) times daily. Patient not taking: Reported on 07/03/2017 06/26/17   Daleen Bo, MD  ciprofloxacin (CIPRO) 500 MG tablet Take 500 mg by mouth daily. For 7 days. 06/29/17   [provider]  diphenhydrAMINE (BENADRYL) 25 MG tablet Take 12.5 mg by mouth 2 (two) times daily as needed for itching or allergies.    [provider]  famotidine (PEPCID) 20 MG tablet Take 1 tablet (20 mg total) by mouth 2 (two) times daily as needed (sour stomach). 08/14/16   Alvia Grove, PA-C  fluticasone (FLONASE) 50 MCG/ACT nasal spray Place 2 sprays into both nostrils daily as  needed for allergies or rhinitis.     [provider]  glipiZIDE (GLUCOTROL) 5 MG tablet Take 0.5 tablets (2.5 mg total) by mouth 2 (two) times daily. Patient taking differently: Take 2.5 mg by mouth daily before breakfast.  08/14/16   Alvia Grove, PA-C  loratadine-pseudoephedrine (CLARITIN-D 24-HOUR) 10-240 MG 24 hr tablet Take 1 tablet by mouth daily as needed for allergies.    [provider]  Menthol, Topical Analgesic, (ICY HOT EX) Apply 1 application topically 3 (three) times daily as needed (pain).    [provider]  multivitamin (RENA-VIT) TABS tablet Take 1 tablet by mouth at bedtime.  12/20/15   [provider]  Naphazoline-Pheniramine (VISINE-A OP) Place 1 drop into both eyes 2 (two) times daily as needed (dry eyes/ allergies).    [provider]  oxyCODONE-acetaminophen (PERCOCET) 7.5-325 MG tablet Take 1 tablet by  mouth 3 (three) times daily as needed (pain). Patient taking differently: Take 1 tablet by mouth 4 (four) times daily as needed (pain).  08/14/16   Alvia Grove, PA-C  oxyCODONE-acetaminophen (ROXICET) 5-325 MG tablet Take 1-2 tablets by mouth every 4 (four) hours as needed. Patient not taking: Reported on 06/26/2017 05/07/17   Angelia Mould, MD  sevelamer carbonate (RENVELA) 800 MG tablet Take 2 tablets (1,600 mg total) by mouth 3 (three) times daily with meals. Patient taking differently: Take 2,400 mg by mouth See admin instructions. Take 3 tablets (2400 mg) by mouth daily with meals - usually twice daily 12/18/15   Geradine Girt, DO    Family History Family History  Problem Relation Age of Onset  . Arthritis Mother   . Cancer Father   . Heart disease Father   . Hypertension Sister   . Alcohol abuse Brother   . Diabetes Daughter   . Heart attack Paternal Uncle     Social History Social History   Tobacco Use  . Smoking status: Former Smoker    Years: 30.00    Last attempt to quit: 10/12/1998    Years since quitting: 19.1  . Smokeless tobacco: Never Used  Substance Use Topics  . Alcohol use: No    Alcohol/week: 0.0 oz    Comment: Used to drink on the weekends, quit 2 years ago  . Drug use: No     Allergies   Meperidine; Sulfa antibiotics; Sulfonamide derivatives; Eggs or egg-derived products; Foltx [elfolate plus]; Lac bovis; Lactose intolerance (gi); Milk-related compounds; Penicillins; and Betadine [povidone iodine]   Review of Systems Review of Systems  All other systems reviewed and are negative.    Physical Exam Updated Vital Signs BP 127/85   Pulse 77   Temp 98.6 F (37 C) (Oral)   Resp 16   SpO2 100%   Physical Exam  Constitutional: He is oriented to person, place, and time. He appears well-developed and well-nourished.  HENT:  Head: Normocephalic and atraumatic.  Eyes: Conjunctivae are normal. Pupils are equal, round, and reactive to  light. Right eye exhibits no discharge. Left eye exhibits no discharge. No scleral icterus.  Neck: Normal range of motion. No JVD present. No tracheal deviation present.  Cardiovascular: Normal rate and regular rhythm.  Pulmonary/Chest: Effort normal and breath sounds normal. No stridor. No respiratory distress. He has no wheezes. He has no rales. He exhibits no tenderness.  Genitourinary:  Genitourinary Comments: Foley in place  Musculoskeletal:  Bilateral lower extremity edema with chronic skin changes no warmth to touch  Neurological: He is alert and  oriented to person, place, and time. Coordination normal.  Skin: Skin is warm.  Psychiatric: He has a normal mood and affect. His behavior is normal. Judgment and thought content normal.  Nursing note and vitals reviewed.    ED Treatments / Results  Labs (all labs ordered are listed, but only abnormal results are displayed) Labs Reviewed  BASIC METABOLIC PANEL - Abnormal; Notable for the following components:      Result Value   Potassium 6.6 (*)    Chloride 98 (*)    CO2 19 (*)    BUN 96 (*)    Creatinine, Ser 12.99 (*)    GFR calc non Af Amer 3 (*)    GFR calc Af Amer 4 (*)    Anion gap 21 (*)    All other components within normal limits  CBC - Abnormal; Notable for the following components:   RBC 3.02 (*)    Hemoglobin 9.4 (*)    HCT 27.9 (*)    RDW 16.0 (*)    All other components within normal limits  URINALYSIS, ROUTINE W REFLEX MICROSCOPIC  CBG MONITORING, ED  CBG MONITORING, ED    EKG  EKG Interpretation  Date/Time:  Thursday December 02 2017 11:19:38 EST Ventricular Rate:  75 PR Interval:  238 QRS Duration: 128 QT Interval:  446 QTC Calculation: 498 R Axis:   -48 Text Interpretation:  Sinus rhythm with 1st degree A-V block Left axis deviation Left bundle branch block Abnormal ECG Since last EKG, TW are more peaked diffusely, particularly the anterior leads Confirmed by Duffy Bruce [phone]) on 12/02/2017  12:22:25 PM       Radiology No results found.  Procedures Procedures (including critical care time)  CRITICAL CARE Performed by: Stevie Kern Caedmon Louque   Total critical care time: 35 minutes  Critical care time was exclusive of separately billable procedures and treating other patients.  Critical care was necessary to treat or prevent imminent or life-threatening deterioration.  Critical care was time spent personally by me on the following activities: development of treatment plan with patient and/or surrogate as well as nursing, discussions with consultants, evaluation of patient's response to treatment, examination of patient, obtaining history from patient or surrogate, ordering and performing treatments and interventions, ordering and review of laboratory studies, ordering and review of radiographic studies, pulse oximetry and re-evaluation of patient's condition. c  Medications Ordered in ED Medications  calcium gluconate 1 g in sodium chloride 0.9 % 100 mL IVPB (1 g Intravenous New Bag/Given 12/02/17 1425)  insulin aspart (novoLOG) injection 5 Units (not administered)  dextrose 50 % solution 50 mL (not administered)     Initial Impression / Assessment and Plan / ED Course  I have reviewed the triage vital signs and the nursing notes.  Pertinent labs & imaging results that were available during my care of the patient were reviewed by me and considered in my medical decision making (see chart for details).      Final Clinical Impressions(s) / ED Diagnoses   Final diagnoses:  Hyperkalemia  Weakness    Labs: CBG, BMP, CBC  Imaging: DG chest 2 view  Consults: Nephrology  Therapeutics: Calcium, insulin, dextrose  Discharge Meds:   Assessment/Plan: 72 year old male presents today with critically high potassium.  Patient with EKG changes showing peaked T  waves.  Presentation likely due to missed dialysis.  Patient will require dialysis here in the hospital.   Patient was given a dose of insulin calcium and dextrose here.  I  discussed the case with on-call nephrologist who agreed patient would be candidate for dialysis at this time.  No indication for hospital admission patient will be discharged from dialysis.      ED Discharge Orders    None       Okey Regal, Hershal Coria 12/02/17 1437    Daleen Bo, MD 12/03/17 [phone]

## 2017-12-02 NOTE — ED Notes (Signed)
Lab called critical value reported potassium 6.6 notified EDP who reviewed EKG and needs next available room. Charge nurse notified.

## 2017-12-02 NOTE — ED Notes (Signed)
Dr Isaacs at bedside 

## 2017-12-02 NOTE — Procedures (Signed)
Asked to see Albert Stanley for HD.  This is not a formal consultation. Missed HD and is here with high K+ and leg edema. The leg edema is acute on chronic, he apparently has lymphedema and they are trying to find some treatment for it, leg wraps "didn't work", he couldn't tolerate it.  Pt is alert and Ox3, no distress, brief exam showing 2-3+ LE edema and clear lungs.  K+ is 6.6, some peaked T's on EKG and LBBB which is old. Plan for HD this afternoon.  Pt will be dc'd home after HD.    I was present at this dialysis session, have reviewed the session itself and made  appropriate changes Kelly Splinter MD [place] pager [phone]   12/02/2017, 3:35 PM

## 2017-12-02 NOTE — ED Notes (Signed)
Patient transported to X-ray 

## 2017-12-02 NOTE — ED Notes (Signed)
Returned from xray

## 2017-12-02 NOTE — ED Notes (Signed)
Dr. Wentz at bedside. 

## 2017-12-02 NOTE — Progress Notes (Deleted)
Asked to see [name] for HD.  This is not a formal consultation. Missed HD and is here with high K+ and leg edema. The leg edema is acute on chronic, he apparently has lymphedema and they are trying to find some treatment for it, leg wraps "didn't work", he couldn't tolerate it.  Pt is alert and Ox3, no distress, brief exam showing 2-3+ LE edema and clear lungs.  K+ is 6.6, some peaked T's on EKG and LBBB which is old. Plan for HD this afternoon.  Pt will be dc'd home after HD.    Kelly Splinter MD [employer] pgr [phone]   12/02/2017, 3:34 PM

## 2017-12-02 NOTE — ED Notes (Signed)
Patient CBG was 80, notified [place], Therapist, sports.

## 2017-12-02 NOTE — ED Notes (Signed)
Nephrology at bedside - Per Dr. Jonnie Finner hold off on the insulin and dextrose, but give the calcium gluconate for potassium.

## 2017-12-02 NOTE — ED Provider Notes (Signed)
  Face-to-face evaluation   History: He presents for evaluation of missed dialysis, because he had been ill with vomiting and diarrhea, 2 days ago.  He has chronic intermittent episodes of constipation and diarrhea.  He takes daily Percocet, and sometimes needs to use MiraLAX for constipation.  He is also concerned about his chronic lower leg edema, which has not been responsive to treatments already initiated.  Physical exam: Elderly, alert, comfortable.  No respiratory distress.  4+ lower leg edema bilaterally.  Medical screening examination/treatment/procedure(s) were conducted as a shared visit with non-physician practitioner(s) and myself.  I personally evaluated the patient during the encounter    Daleen Bo, MD 12/03/17 1157

## 2017-12-02 NOTE — ED Triage Notes (Signed)
Pt is here with increased swelling to knee to feet and continues to worsen.  Pt states he started feeling bad and has vomited once.  They are trying to get into the lymphedema clinic.  Pt has chf and dialysis.  Pt feels weak. Pt states that his blood sugars have been dropping into the 50s and has not been taking the oral diabetic medications.

## 2017-12-04 ENCOUNTER — Other Ambulatory Visit: Payer: Self-pay

## 2017-12-04 ENCOUNTER — Encounter (HOSPITAL_COMMUNITY): Payer: Self-pay

## 2017-12-04 ENCOUNTER — Non-Acute Institutional Stay (HOSPITAL_COMMUNITY)
Admission: EM | Admit: 2017-12-04 | Discharge: 2017-12-04 | Disposition: A | Discharge status: Home / Self Care | Payer: Medicare Other | Source: Home / Self Care | Attending: Emergency Medicine | Admitting: Emergency Medicine

## 2017-12-04 ENCOUNTER — Emergency Department (HOSPITAL_COMMUNITY): Payer: Medicare Other

## 2017-12-04 DIAGNOSIS — I5042 Chronic combined systolic (congestive) and diastolic (congestive) heart failure: Secondary | ICD-10-CM

## 2017-12-04 DIAGNOSIS — N186 End stage renal disease: Secondary | ICD-10-CM | POA: Insufficient documentation

## 2017-12-04 DIAGNOSIS — S39012A Strain of muscle, fascia and tendon of lower back, initial encounter: Secondary | ICD-10-CM

## 2017-12-04 DIAGNOSIS — Z992 Dependence on renal dialysis: Secondary | ICD-10-CM | POA: Insufficient documentation

## 2017-12-04 DIAGNOSIS — Y92531 Health care provider office as the place of occurrence of the external cause: Secondary | ICD-10-CM | POA: Insufficient documentation

## 2017-12-04 DIAGNOSIS — Z7951 Long term (current) use of inhaled steroids: Secondary | ICD-10-CM | POA: Insufficient documentation

## 2017-12-04 DIAGNOSIS — J45909 Unspecified asthma, uncomplicated: Secondary | ICD-10-CM

## 2017-12-04 DIAGNOSIS — E875 Hyperkalemia: Secondary | ICD-10-CM | POA: Insufficient documentation

## 2017-12-04 DIAGNOSIS — M545 Low back pain: Secondary | ICD-10-CM | POA: Insufficient documentation

## 2017-12-04 DIAGNOSIS — K219 Gastro-esophageal reflux disease without esophagitis: Secondary | ICD-10-CM | POA: Insufficient documentation

## 2017-12-04 DIAGNOSIS — M109 Gout, unspecified: Secondary | ICD-10-CM | POA: Insufficient documentation

## 2017-12-04 DIAGNOSIS — E7849 Other hyperlipidemia: Secondary | ICD-10-CM

## 2017-12-04 DIAGNOSIS — Z86718 Personal history of other venous thrombosis and embolism: Secondary | ICD-10-CM

## 2017-12-04 DIAGNOSIS — Z79899 Other long term (current) drug therapy: Secondary | ICD-10-CM

## 2017-12-04 DIAGNOSIS — W010XXA Fall on same level from slipping, tripping and stumbling without subsequent striking against object, initial encounter: Secondary | ICD-10-CM

## 2017-12-04 DIAGNOSIS — Z8551 Personal history of malignant neoplasm of bladder: Secondary | ICD-10-CM

## 2017-12-04 DIAGNOSIS — E1169 Type 2 diabetes mellitus with other specified complication: Secondary | ICD-10-CM

## 2017-12-04 DIAGNOSIS — E1122 Type 2 diabetes mellitus with diabetic chronic kidney disease: Secondary | ICD-10-CM

## 2017-12-04 DIAGNOSIS — M533 Sacrococcygeal disorders, not elsewhere classified: Secondary | ICD-10-CM

## 2017-12-04 DIAGNOSIS — I493 Ventricular premature depolarization: Secondary | ICD-10-CM | POA: Insufficient documentation

## 2017-12-04 DIAGNOSIS — Z7984 Long term (current) use of oral hypoglycemic drugs: Secondary | ICD-10-CM

## 2017-12-04 DIAGNOSIS — Z96642 Presence of left artificial hip joint: Secondary | ICD-10-CM

## 2017-12-04 DIAGNOSIS — I132 Hypertensive heart and chronic kidney disease with heart failure and with stage 5 chronic kidney disease, or end stage renal disease: Secondary | ICD-10-CM

## 2017-12-04 DIAGNOSIS — D631 Anemia in chronic kidney disease: Secondary | ICD-10-CM | POA: Insufficient documentation

## 2017-12-04 DIAGNOSIS — I428 Other cardiomyopathies: Secondary | ICD-10-CM

## 2017-12-04 DIAGNOSIS — W19XXXA Unspecified fall, initial encounter: Secondary | ICD-10-CM | POA: Insufficient documentation

## 2017-12-04 LAB — BASIC METABOLIC PANEL
ANION GAP: 22 — AB (ref 5–15)
BUN: 92 mg/dL — ABNORMAL HIGH (ref 6–20)
CHLORIDE: 95 mmol/L — AB (ref 101–111)
CO2: 19 mmol/L — AB (ref 22–32)
Calcium: 8.7 mg/dL — ABNORMAL LOW (ref 8.9–10.3)
Creatinine, Ser: 12.17 mg/dL — ABNORMAL HIGH (ref 0.61–1.24)
GFR calc non Af Amer: 4 mL/min — ABNORMAL LOW (ref >60)
GFR, EST AFRICAN AMERICAN: 4 mL/min — AB (ref >60)
GLUCOSE: 96 mg/dL (ref 65–99)
Potassium: 5.7 mmol/L — ABNORMAL HIGH (ref 3.5–5.1)
Sodium: 136 mmol/L (ref 135–145)

## 2017-12-04 LAB — CBC
HEMATOCRIT: 26.9 % — AB (ref 39.0–52.0)
HEMOGLOBIN: 9.1 g/dL — AB (ref 13.0–17.0)
MCH: 31.1 pg (ref 26.0–34.0)
MCHC: 33.8 g/dL (ref 30.0–36.0)
MCV: 91.8 fL (ref 78.0–100.0)
Platelets: 163 10*3/uL (ref 150–400)
RBC: 2.93 MIL/uL — AB (ref 4.22–5.81)
RDW: 16 % — ABNORMAL HIGH (ref 11.5–15.5)
WBC: 5.7 10*3/uL (ref 4.0–10.5)

## 2017-12-04 LAB — CBG MONITORING, ED: Glucose-Capillary: 80 mg/dL (ref 65–99)

## 2017-12-04 MED ORDER — PENTAFLUOROPROP-TETRAFLUOROETH EX AERO: 1.0000 "application " | INHALATION_SPRAY | CUTANEOUS | Status: DC | PRN

## 2017-12-04 MED ORDER — HEPARIN SODIUM (PORCINE) 1000 UNIT/ML DIALYSIS: 1000.0000 [IU] | INTRAMUSCULAR | Status: DC | PRN

## 2017-12-04 MED ORDER — LIDOCAINE-PRILOCAINE 2.5-2.5 % EX CREA: 1.0000 "application " | TOPICAL_CREAM | CUTANEOUS | Status: DC | PRN

## 2017-12-04 MED ORDER — LIDOCAINE HCL (PF) 1 % IJ SOLN: 5.0000 mL | INTRAMUSCULAR | Status: DC | PRN

## 2017-12-04 MED ORDER — SODIUM CHLORIDE 0.9 % IV SOLN
100.0000 mL | INTRAVENOUS | Status: DC | PRN
Start: 2017-12-04 — End: 2017-12-05

## 2017-12-04 MED ORDER — ALTEPLASE 2 MG IJ SOLR: 2.0000 mg | Freq: Once | INTRAMUSCULAR | Status: DC | PRN

## 2017-12-04 MED ORDER — TRAMADOL HCL 50 MG PO TABS
50.0000 mg | ORAL_TABLET | Freq: Once | ORAL | Status: AC
  Administered 2017-12-04: 50 mg via ORAL
  Filled 2017-12-04: qty 1

## 2017-12-04 MED ORDER — HEPARIN SODIUM (PORCINE) 1000 UNIT/ML DIALYSIS
5000.0000 [IU] | Freq: Once | INTRAMUSCULAR | Status: AC
  Administered 2017-12-04: 5000 [IU] via INTRAVENOUS_CENTRAL
  Filled 2017-12-04: qty 5

## 2017-12-04 NOTE — ED Triage Notes (Addendum)
Per Pt, Pt reports falling yesterday when he tripped on the carpet. He denies hitting his head. This morning patient woke up dizzy and complaints of unsteady gait. Denies SOB, but reports pain in buttocks and neck from fall. Denies unilateral weakness.

## 2017-12-04 NOTE — ED Notes (Signed)
Pt given tramadol for pain, states is this not a narcotic? I am used to taking oxycodone.  Family coaxed pt into taking medication.  Given fluids, pt waiting on food he states

## 2017-12-04 NOTE — Progress Notes (Signed)
HD tx initiated via 15Gx2 w/ some difficulty obtaining access to arterial site, pull/push/flush well, VSS, will cont to monitor while on HD tx

## 2017-12-04 NOTE — ED Provider Notes (Signed)
[facility] EMERGENCY DEPARTMENT Provider Note   CSN: [id number] Arrival date & time: 12/04/17  1152     History   Chief Complaint Chief Complaint  Patient presents with  . Fall    HPI Albert Stanley is a 72 y.o. male.  Patient c/o fall yesterday at doctors office. He indicates his feet got tangled in rug and tripped. Pt declined transport yesterday. Today c/o low back and sacral pain. Pain constant, dull, moderate, non radiating, worse w certain movements and positional changes. No radicular pain down leg. No numbness or weakness. Denies fever. No gu c/o. Denies loc or head injury. No headache. No neck or back pain.  States was supposed to go to dialysis today, but instead came to ED for xrays.   The history is provided by the patient.  Fall  Pertinent negatives include no chest pain, no abdominal pain, no headaches and no shortness of breath.    Past Medical History:  Diagnosis Date  . Arthritis    Osteoarthritis  . Asthma   . Bladder cancer ([place]) dx'd 1990   surg only  . CHF (congestive heart failure) ([place])   . Diabetes mellitus without complication ([place])   . ESRD (end stage renal disease) ([name])    Tues, Thurs, Sat dialysis  . GERD (gastroesophageal reflux disease)   . History of cardiac catheterization    a. LHC 5/17: no obstructive CAD  . History of DVT (deep vein thrombosis)    2003 ish  . History of nuclear stress test    a. Myoview 4/17: EF 39%, inf, inf-lat, apical inf, apical lat, apical scar, intermediate risk  . Hyperlipidemia   . Hypertension   . NICM (nonischemic cardiomyopathy) ([place])    a. Echo 2/17: mod LVH, EF 35-40%, inf-lat and inf-sept HK, mod MR, severe LAE, small pericardial effusion    Patient Active Problem List   Diagnosis Date Noted  . Hyperkalemia 12/02/2017  . Malnutrition of moderate degree 12/18/2016  . PVC's (premature ventricular contractions)   . Anemia associated with chronic renal failure   . End-stage  renal disease on hemodialysis ([place]) 12/09/2016  . Dyslipidemia associated with type 2 diabetes mellitus ([place]) 12/09/2016  . Acute on chronic systolic and diastolic heart failure, NYHA class 1 ([place]) 12/09/2016  . 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 Type 2 diabetes mellitus ([place]) 12/11/2015  . Fluid overload 12/06/2015  . History of DVT (deep vein thrombosis) 12/04/2015  . Renal hematoma 12/04/2015  . H/O: gout 11/16/2013  . Trochanteric bursitis 10/17/2013  . 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 Lumbar degenerative disc disease 07/17/2013    Past Surgical History:  Procedure Laterality Date  . arthroscopic knee surgery Left   . AV FISTULA PLACEMENT Left 12/13/2015   Procedure: ARTERIOVENOUS (AV) FISTULA CREATION;  Surgeon: Angelia Mould, MD;  Location: [facility];  Service: Vascular;  Laterality: Left;  . BACK SURGERY     2 times  1960  . CARDIAC CATHETERIZATION N/A 02/28/2016   Procedure: Left Heart Cath and Coronary Angiography;  Surgeon: Larey Dresser, MD;  Location: [facility];  Service: Cardiovascular;  Laterality: N/A;  . CYSTOSCOPY    . FISTULA SUPERFICIALIZATION Left 08/14/2016   Procedure: FISTULA SUPERFICIALIZATION LEFT UPPER ARM;  Surgeon: Angelia Mould, MD;  Location: [facility];  Service: Vascular;  Laterality: Left;  . INSERTION OF DIALYSIS CATHETER N/A 12/13/2015   Procedure: INSERTION OF DIALYSIS CATHETER;  Surgeon: Angelia Mould, MD;  Location: [facility];  Service: Vascular;  Laterality: N/A;  . INSERTION OF DIALYSIS CATHETER Left 05/07/2017   Procedure: INSERTION OF DIALYSIS CATHETER LEFT INTERNAL JUGULAR PLACEMENT;  Surgeon: Angelia Mould, MD;  Location: [facility];  Service: Vascular;  Laterality: Left;  . IR RADIOLOGIST EVAL & MGMT  05/12/2017  . JOINT REPLACEMENT Left    hip  . LIGATION OF COMPETING BRANCHES OF ARTERIOVENOUS FISTULA Left 08/14/2016   Procedure: LIGATION OF COMPETING  BRANCHES OF ARTERIOVENOUS FISTULA LEFT UPPER ARM;  Surgeon: Angelia Mould, MD;  Location: [facility];  Service: Vascular;  Laterality: Left;  . PERIPHERAL VASCULAR CATHETERIZATION Left 06/09/2016   Procedure: Fistulagram;  Surgeon: Serafina Mitchell, MD;  Location: [facility];  Service: Cardiovascular;  Laterality: Left;  ARM  . PERIPHERAL VASCULAR CATHETERIZATION Left 06/09/2016   Procedure: Peripheral Vascular Balloon Angioplasty;  Surgeon: Serafina Mitchell, MD;  Location: [facility];  Service: Cardiovascular;  Laterality: Left;  upper arm venous  . PERIPHERAL VASCULAR CATHETERIZATION Left 06/25/2016   Procedure: Fistulagram;  Surgeon: Serafina Mitchell, MD;  Location: [facility];  Service: Cardiovascular;  Laterality: Left;  . REVISON OF ARTERIOVENOUS FISTULA Left 05/07/2017   Procedure: REPAIR OF PSEUDOANEURYSM  LEFT BRACHIO-CEPHALIC  ARM ARTERIOVENOUS FISTULA;  Surgeon: Angelia Mould, MD;  Location: [facility];  Service: Vascular;  Laterality: Left;       Home Medications    Prior to Admission medications   Medication Sig Start Date End Date Taking? Authorizing Provider  albuterol (PROVENTIL HFA;VENTOLIN HFA) 108 (90 Base) MCG/ACT inhaler Inhale 1-2 puffs into the lungs every 6 (six) hours as needed for wheezing or shortness of breath. 12/03/16   Kinnie Feil, PA-C  allopurinol (ZYLOPRIM) 300 MG tablet Take 300 mg by mouth daily.     [provider]  amiodarone (PACERONE) 200 MG tablet Take 1 tablet (200 mg total) by mouth daily. 09/24/17   Larey Dresser, MD  ASPERCREME LIDOCAINE EX Apply 1 application topically 3 (three) times daily as needed (pain).    [provider]  atorvastatin (LIPITOR) 40 MG tablet Take 40 mg by mouth at bedtime.  01/21/16   [provider]  BAYER BREEZE 2 TEST DISK Apply 1 each topically daily as needed (FOR TESTING BLOOD SUGAR).  11/13/15   [provider]  calcitRIOL (ROCALTROL) 0.5 MCG capsule  Take 0.5 mcg by mouth Every Tuesday,Thursday,and Saturday with dialysis. Medication given at dialysis on Tues Thurs Sat 11/13/15   [provider]  carvedilol (COREG) 3.125 MG tablet Take 1 tablet (3.125 mg total) by mouth See admin instructions. Take 1 tablet (3.125 mg) by mouth twice daily on Monday, Wednesday, Friday and Sunday (non-dialysis days) 08/14/16   Alvia Grove, PA-C  cephALEXin (KEFLEX) 250 MG capsule Take 1 capsule (250 mg total) by mouth 4 (four) times daily. Patient not taking: Reported on 07/03/2017 06/26/17   Daleen Bo, MD  ciprofloxacin (CIPRO) 500 MG tablet Take 500 mg by mouth daily. For 7 days. 06/29/17   [provider]  diphenhydrAMINE (BENADRYL) 25 MG tablet Take 12.5 mg by mouth 2 (two) times daily as needed for itching or allergies.    [provider]  famotidine (PEPCID) 20 MG tablet Take 1 tablet (20 mg total) by mouth 2 (two) times daily as needed (sour stomach). 08/14/16   Alvia Grove, PA-C  fluticasone (FLONASE) 50 MCG/ACT nasal spray Place 2 sprays into both  nostrils daily as needed for allergies or rhinitis.     [provider]  glipiZIDE (GLUCOTROL) 5 MG tablet Take 0.5 tablets (2.5 mg total) by mouth 2 (two) times daily. Patient taking differently: Take 2.5 mg by mouth daily before breakfast.  08/14/16   Alvia Grove, PA-C  loratadine-pseudoephedrine (CLARITIN-D 24-HOUR) 10-240 MG 24 hr tablet Take 1 tablet by mouth daily as needed for allergies.    [provider]  Menthol, Topical Analgesic, (ICY HOT EX) Apply 1 application topically 3 (three) times daily as needed (pain).    [provider]  multivitamin (RENA-VIT) TABS tablet Take 1 tablet by mouth at bedtime.  12/20/15   [provider]  Naphazoline-Pheniramine (VISINE-A OP) Place 1 drop into both eyes 2 (two) times daily as needed (dry eyes/ allergies).    [provider]  oxyCODONE-acetaminophen (PERCOCET) 7.5-325 MG tablet  Take 1 tablet by mouth 3 (three) times daily as needed (pain). Patient taking differently: Take 1 tablet by mouth 4 (four) times daily as needed (pain).  08/14/16   Alvia Grove, PA-C  oxyCODONE-acetaminophen (ROXICET) 5-325 MG tablet Take 1-2 tablets by mouth every 4 (four) hours as needed. Patient not taking: Reported on 06/26/2017 05/07/17   Angelia Mould, MD  sevelamer carbonate (RENVELA) 800 MG tablet Take 2 tablets (1,600 mg total) by mouth 3 (three) times daily with meals. Patient taking differently: Take 2,400 mg by mouth See admin instructions. Take 3 tablets (2400 mg) by mouth daily with meals - usually twice daily 12/18/15   Geradine Girt, DO    Family History Family History  Problem Relation Age of Onset  . Arthritis Mother   . Cancer Father   . Heart disease Father   . Hypertension Sister   . Alcohol abuse Brother   . Diabetes Daughter   . Heart attack Paternal Uncle     Social History Social History   Tobacco Use  . Smoking status: Former Smoker    Years: 30.00    Last attempt to quit: 10/12/1998    Years since quitting: 19.1  . Smokeless tobacco: Never Used  Substance Use Topics  . Alcohol use: No    Alcohol/week: 0.0 oz    Comment: Used to drink on the weekends, quit 2 years ago  . Drug use: No     Allergies   Meperidine; Sulfa antibiotics; Sulfonamide derivatives; Eggs or egg-derived products; Foltx [elfolate plus]; Lac bovis; Lactose intolerance (gi); Milk-related compounds; Penicillins; and Betadine [povidone iodine]   Review of Systems Review of Systems  Constitutional: Negative for fever.  HENT: Negative for sore throat.   Eyes: Negative for redness.  Respiratory: Negative for shortness of breath.   Cardiovascular: Negative for chest pain.  Gastrointestinal: Negative for abdominal pain and vomiting.  Genitourinary: Negative for flank pain.  Musculoskeletal: Positive for back pain. Negative for neck pain.  Skin: Negative for wound.    Neurological: Negative for weakness, numbness and headaches.  Hematological: Does not bruise/bleed easily.  Psychiatric/Behavioral: Negative for confusion.     Physical Exam Updated Vital Signs BP 125/81 (BP Location: Right Arm)   Pulse 82   Temp 99.4 F (37.4 C) (Oral)   Resp 16   Ht 1.772 m (5' 9.75")   Wt 95.3 kg (210 lb)   SpO2 100%   BMI 30.35 kg/m   Physical Exam  Constitutional: He is oriented to person, place, and time. He appears well-developed and well-nourished. No distress.  HENT:  Head: Atraumatic.  Mouth/Throat: Oropharynx is clear and moist.  Eyes: Conjunctivae are normal. Pupils are equal, round, and reactive to light.  Neck: Normal range of motion. Neck supple. No tracheal deviation present.  Cardiovascular: Normal rate, regular rhythm, normal heart sounds and intact distal pulses. Exam reveals no gallop and no friction rub.  No murmur heard. Pulmonary/Chest: Effort normal and breath sounds normal. No accessory muscle usage. No respiratory distress. He exhibits no tenderness.  Abdominal: Soft. Bowel sounds are normal. He exhibits no distension. There is no tenderness.  Genitourinary:  Genitourinary Comments: No cva tenderness  Musculoskeletal: He exhibits no edema.  Lower lumbar and sacral/coccyx tenderness, otherwise, CTLS spine, non tender, aligned, no step off. Good rom bil extremities without pain or focal bony tenderness. Distal pulses palp.   Neurological: He is alert and oriented to person, place, and time.  Speech clear/fluent. Motor intact bil, stre 5/5. sens grossly intact.   Skin: Skin is warm and dry. He is not diaphoretic.  Psychiatric: He has a normal mood and affect.  Nursing note and vitals reviewed.    ED Treatments / Results  Labs (all labs ordered are listed, but only abnormal results are displayed) Results for orders placed or performed during the hospital encounter of 37/48/27  Basic metabolic panel  Result Value Ref Range    Sodium 136 135 - 145 mmol/L   Potassium 5.7 (H) 3.5 - 5.1 mmol/L   Chloride 95 (L) 101 - 111 mmol/L   CO2 19 (L) 22 - 32 mmol/L   Glucose, Bld 96 65 - 99 mg/dL   BUN 92 (H) 6 - 20 mg/dL   Creatinine, Ser 12.17 (H) 0.61 - 1.24 mg/dL   Calcium 8.7 (L) 8.9 - 10.3 mg/dL   GFR calc non Af Amer 4 (L) >60 mL/min   GFR calc Af Amer 4 (L) >60 mL/min   Anion gap 22 (H) 5 - 15  CBC  Result Value Ref Range   WBC 5.7 4.0 - 10.5 K/uL   RBC 2.93 (L) 4.22 - 5.81 MIL/uL   Hemoglobin 9.1 (L) 13.0 - 17.0 g/dL   HCT 26.9 (L) 39.0 - 52.0 %   MCV 91.8 78.0 - 100.0 fL   MCH 31.1 26.0 - 34.0 pg   MCHC 33.8 30.0 - 36.0 g/dL   RDW 16.0 (H) 11.5 - 15.5 %   Platelets 163 150 - 400 K/uL  CBG monitoring, ED  Result Value Ref Range   Glucose-Capillary 80 65 - 99 mg/dL   Dg Chest 2 View  Result Date: 12/02/2017 CLINICAL DATA:  Worsening swelling of the legs. EXAM: CHEST  2 VIEW COMPARISON:  05/07/2017 FINDINGS: Chronic cardiomegaly and aortic atherosclerosis. The lungs are clear except for chronic markings. No sign of interstitial or alveolar edema. No effusion. No acute bone finding. IMPRESSION: Chronic cardiomegaly. Chronic lung markings. No edema or effusions. Electronically Signed   By: Nelson Chimes M.D.   On: 12/02/2017 14:56   Dg Lumbar Spine Complete  Result Date: 12/04/2017 CLINICAL DATA:  Fall yesterday.  Low back pain. EXAM: LUMBAR SPINE - COMPLETE 4+ VIEW COMPARISON:  CT abdomen and pelvis 07/03/2017 FINDINGS: Five non rib-bearing lumbar type vertebral bodies are present. Multilevel degenerative changes are again seen within the lumbar spine. Endplate marrow changes at L2-3 and L4-5 are stable. Multilevel facet degenerative changes are noted. No acute fracture traumatic subluxation is present. Atherosclerotic calcifications are present in the aortic arch without aneurysm. Left hip replacement is noted. IMPRESSION: 1. Stable multilevel degenerative changes in  the lumbar spine. 2. No acute abnormality.  Electronically Signed   By: San Morelle M.D.   On: 12/04/2017 15:15   Dg Pelvis 1-2 Views  Result Date: 12/04/2017 CLINICAL DATA:  [place] yesterday.  Pelvic pain. EXAM: PELVIS - 1-2 VIEW COMPARISON:  CT 07/03/2017 FINDINGS: No evidence of acute fracture. Previous total hip replacement on the left. Lucency in the periacetabular bone and proximal femur suggests the possibility of loosening or particle disease. IMPRESSION: No acute or traumatic finding. Lucency in the periacetabular bone and proximal femur suggest the possibility of loosening or particle disease. Electronically Signed   By: Nelson Chimes M.D.   On: 12/04/2017 15:16   Dg Sacrum/coccyx  Result Date: 12/04/2017 CLINICAL DATA:  Fall yesterday. EXAM: SACRUM AND COCCYX - 2+ VIEW COMPARISON:  None. FINDINGS: There is an unusual configuration to the sacrum on the lateral view. A fracture is not excluded. CT imaging could better evaluate. Degenerative changes are seen in the lower lumbar spine. No other acute abnormalities. IMPRESSION: An unusual configuration to the sacrum on the lateral view is identified. A fracture is not excluded. CT imaging could better evaluate. Electronically Signed   By: Dorise Bullion III M.D   On: 12/04/2017 15:17    EKG  EKG Interpretation  Date/Time:  Saturday December 04 2017 12:04:48 EST Ventricular Rate:  81 PR Interval:  208 QRS Duration: 110 QT Interval:  418 QTC Calculation: 485 R Axis:   -44 Text Interpretation:  Normal sinus rhythm Left axis deviation \ Confirmed by Lajean Saver [phone]) on 12/04/2017 2:17:13 PM       Radiology Dg Lumbar Spine Complete  Result Date: 12/04/2017 CLINICAL DATA:  Fall yesterday.  Low back pain. EXAM: LUMBAR SPINE - COMPLETE 4+ VIEW COMPARISON:  CT abdomen and pelvis 07/03/2017 FINDINGS: Five non rib-bearing lumbar type vertebral bodies are present. Multilevel degenerative changes are again seen within the lumbar spine. Endplate marrow changes at L2-3 and L4-5  are stable. Multilevel facet degenerative changes are noted. No acute fracture traumatic subluxation is present. Atherosclerotic calcifications are present in the aortic arch without aneurysm. Left hip replacement is noted. IMPRESSION: 1. Stable multilevel degenerative changes in the lumbar spine. 2. No acute abnormality. Electronically Signed   By: San Morelle M.D.   On: 12/04/2017 15:15   Dg Pelvis 1-2 Views  Result Date: 12/04/2017 CLINICAL DATA:  [place] yesterday.  Pelvic pain. EXAM: PELVIS - 1-2 VIEW COMPARISON:  CT 07/03/2017 FINDINGS: No evidence of acute fracture. Previous total hip replacement on the left. Lucency in the periacetabular bone and proximal femur suggests the possibility of loosening or particle disease. IMPRESSION: No acute or traumatic finding. Lucency in the periacetabular bone and proximal femur suggest the possibility of loosening or particle disease. Electronically Signed   By: Nelson Chimes M.D.   On: 12/04/2017 15:16   Dg Sacrum/coccyx  Result Date: 12/04/2017 CLINICAL DATA:  Fall yesterday. EXAM: SACRUM AND COCCYX - 2+ VIEW COMPARISON:  None. FINDINGS: There is an unusual configuration to the sacrum on the lateral view. A fracture is not excluded. CT imaging could better evaluate. Degenerative changes are seen in the lower lumbar spine. No other acute abnormalities. IMPRESSION: An unusual configuration to the sacrum on the lateral view is identified. A fracture is not excluded. CT imaging could better evaluate. Electronically Signed   By: Dorise Bullion III M.D   On: 12/04/2017 15:17    Procedures Procedures (including critical care time)  Medications Ordered in ED Medications - No data  to display   Initial Impression / Assessment and Plan / ED Course  I have reviewed the triage vital signs and the nursing notes.  Pertinent labs & imaging results that were available during my care of the patient were reviewed by me and considered in my medical decision  making (see chart for details).  Labs sent from triage.   xrays ordered.  Reviewed labs - CRI and mild hyperkalemia today.  xrays reviewed - no acute fracture.  Consulted nephrology on call as pt missed his HD today, k is high, and he isnt due for next dialysis for 3 days - Dr Jonnie Finner indicates that can do in hospital dialysis, and then d/c post dialysis.     Final Clinical Impressions(s) / ED Diagnoses   Final diagnoses:  None    ED Discharge Orders    None       Lajean Saver, MD 12/04/17 [phone]

## 2017-12-05 ENCOUNTER — Emergency Department (HOSPITAL_COMMUNITY): Payer: Medicare Other

## 2017-12-05 ENCOUNTER — Inpatient Hospital Stay (HOSPITAL_COMMUNITY)
Admission: EM | Disposition: A | Discharge status: Rehabilitation Facility | Payer: Self-pay | Source: Home / Self Care | Attending: Neurosurgery

## 2017-12-05 ENCOUNTER — Inpatient Hospital Stay (HOSPITAL_COMMUNITY)
Admission: EM | Admit: 2017-12-05 | Discharge: 2017-12-16 | DRG: 025 | Disposition: A | Discharge status: Rehabilitation Facility | Payer: Medicare Other | Attending: Neurosurgery | Admitting: Neurosurgery

## 2017-12-05 ENCOUNTER — Emergency Department (HOSPITAL_COMMUNITY): Payer: Medicare Other | Admitting: Anesthesiology

## 2017-12-05 ENCOUNTER — Inpatient Hospital Stay (HOSPITAL_COMMUNITY): Payer: Medicare Other

## 2017-12-05 DIAGNOSIS — Z91011 Allergy to milk products: Secondary | ICD-10-CM

## 2017-12-05 DIAGNOSIS — Z452 Encounter for adjustment and management of vascular access device: Secondary | ICD-10-CM | POA: Diagnosis not present

## 2017-12-05 DIAGNOSIS — R001 Bradycardia, unspecified: Secondary | ICD-10-CM | POA: Diagnosis not present

## 2017-12-05 DIAGNOSIS — Z8249 Family history of ischemic heart disease and other diseases of the circulatory system: Secondary | ICD-10-CM

## 2017-12-05 DIAGNOSIS — T82868A Thrombosis of vascular prosthetic devices, implants and grafts, initial encounter: Secondary | ICD-10-CM | POA: Diagnosis not present

## 2017-12-05 DIAGNOSIS — E11649 Type 2 diabetes mellitus with hypoglycemia without coma: Secondary | ICD-10-CM | POA: Diagnosis not present

## 2017-12-05 DIAGNOSIS — E669 Obesity, unspecified: Secondary | ICD-10-CM | POA: Diagnosis present

## 2017-12-05 DIAGNOSIS — Z9889 Other specified postprocedural states: Secondary | ICD-10-CM

## 2017-12-05 DIAGNOSIS — S065X2S Traumatic subdural hemorrhage with loss of consciousness of 31 minutes to 59 minutes, sequela: Secondary | ICD-10-CM | POA: Diagnosis not present

## 2017-12-05 DIAGNOSIS — I890 Lymphedema, not elsewhere classified: Secondary | ICD-10-CM | POA: Diagnosis present

## 2017-12-05 DIAGNOSIS — N186 End stage renal disease: Secondary | ICD-10-CM | POA: Diagnosis not present

## 2017-12-05 DIAGNOSIS — Z880 Allergy status to penicillin: Secondary | ICD-10-CM

## 2017-12-05 DIAGNOSIS — Y832 Surgical operation with anastomosis, bypass or graft as the cause of abnormal reaction of the patient, or of later complication, without mention of misadventure at the time of the procedure: Secondary | ICD-10-CM | POA: Diagnosis not present

## 2017-12-05 DIAGNOSIS — E871 Hypo-osmolality and hyponatremia: Secondary | ICD-10-CM | POA: Diagnosis not present

## 2017-12-05 DIAGNOSIS — R1310 Dysphagia, unspecified: Secondary | ICD-10-CM

## 2017-12-05 DIAGNOSIS — K219 Gastro-esophageal reflux disease without esophagitis: Secondary | ICD-10-CM | POA: Diagnosis present

## 2017-12-05 DIAGNOSIS — Z86718 Personal history of other venous thrombosis and embolism: Secondary | ICD-10-CM

## 2017-12-05 DIAGNOSIS — I132 Hypertensive heart and chronic kidney disease with heart failure and with stage 5 chronic kidney disease, or end stage renal disease: Secondary | ICD-10-CM | POA: Diagnosis present

## 2017-12-05 DIAGNOSIS — I428 Other cardiomyopathies: Secondary | ICD-10-CM | POA: Diagnosis present

## 2017-12-05 DIAGNOSIS — I5032 Chronic diastolic (congestive) heart failure: Secondary | ICD-10-CM

## 2017-12-05 DIAGNOSIS — Z79899 Other long term (current) drug therapy: Secondary | ICD-10-CM

## 2017-12-05 DIAGNOSIS — T82590A Other mechanical complication of surgically created arteriovenous fistula, initial encounter: Secondary | ICD-10-CM | POA: Diagnosis not present

## 2017-12-05 DIAGNOSIS — Z7189 Other specified counseling: Secondary | ICD-10-CM

## 2017-12-05 DIAGNOSIS — E1122 Type 2 diabetes mellitus with diabetic chronic kidney disease: Secondary | ICD-10-CM | POA: Diagnosis present

## 2017-12-05 DIAGNOSIS — E7849 Other hyperlipidemia: Secondary | ICD-10-CM | POA: Diagnosis present

## 2017-12-05 DIAGNOSIS — M109 Gout, unspecified: Secondary | ICD-10-CM | POA: Diagnosis present

## 2017-12-05 DIAGNOSIS — E875 Hyperkalemia: Secondary | ICD-10-CM | POA: Diagnosis present

## 2017-12-05 DIAGNOSIS — Z96642 Presence of left artificial hip joint: Secondary | ICD-10-CM | POA: Diagnosis present

## 2017-12-05 DIAGNOSIS — W1830XA Fall on same level, unspecified, initial encounter: Secondary | ICD-10-CM | POA: Diagnosis present

## 2017-12-05 DIAGNOSIS — Z992 Dependence on renal dialysis: Secondary | ICD-10-CM | POA: Diagnosis not present

## 2017-12-05 DIAGNOSIS — G935 Compression of brain: Secondary | ICD-10-CM | POA: Diagnosis present

## 2017-12-05 DIAGNOSIS — I5042 Chronic combined systolic (congestive) and diastolic (congestive) heart failure: Secondary | ICD-10-CM | POA: Diagnosis present

## 2017-12-05 DIAGNOSIS — E1169 Type 2 diabetes mellitus with other specified complication: Secondary | ICD-10-CM | POA: Diagnosis not present

## 2017-12-05 DIAGNOSIS — Z888 Allergy status to other drugs, medicaments and biological substances status: Secondary | ICD-10-CM

## 2017-12-05 DIAGNOSIS — R451 Restlessness and agitation: Secondary | ICD-10-CM | POA: Diagnosis not present

## 2017-12-05 DIAGNOSIS — E119 Type 2 diabetes mellitus without complications: Secondary | ICD-10-CM | POA: Diagnosis not present

## 2017-12-05 DIAGNOSIS — S065X3S Traumatic subdural hemorrhage with loss of consciousness of 1 hour to 5 hours 59 minutes, sequela: Secondary | ICD-10-CM | POA: Diagnosis not present

## 2017-12-05 DIAGNOSIS — Z9911 Dependence on respirator [ventilator] status: Secondary | ICD-10-CM | POA: Diagnosis not present

## 2017-12-05 DIAGNOSIS — Z87891 Personal history of nicotine dependence: Secondary | ICD-10-CM

## 2017-12-05 DIAGNOSIS — S065XAA Traumatic subdural hemorrhage with loss of consciousness status unknown, initial encounter: Secondary | ICD-10-CM

## 2017-12-05 DIAGNOSIS — Z882 Allergy status to sulfonamides status: Secondary | ICD-10-CM

## 2017-12-05 DIAGNOSIS — S065X1S Traumatic subdural hemorrhage with loss of consciousness of 30 minutes or less, sequela: Secondary | ICD-10-CM | POA: Diagnosis not present

## 2017-12-05 DIAGNOSIS — I5033 Acute on chronic diastolic (congestive) heart failure: Secondary | ICD-10-CM | POA: Diagnosis not present

## 2017-12-05 DIAGNOSIS — G8194 Hemiplegia, unspecified affecting left nondominant side: Secondary | ICD-10-CM | POA: Diagnosis present

## 2017-12-05 DIAGNOSIS — D631 Anemia in chronic kidney disease: Secondary | ICD-10-CM | POA: Diagnosis present

## 2017-12-05 DIAGNOSIS — I6201 Nontraumatic acute subdural hemorrhage: Principal | ICD-10-CM | POA: Diagnosis present

## 2017-12-05 DIAGNOSIS — D638 Anemia in other chronic diseases classified elsewhere: Secondary | ICD-10-CM | POA: Diagnosis not present

## 2017-12-05 DIAGNOSIS — I10 Essential (primary) hypertension: Secondary | ICD-10-CM

## 2017-12-05 DIAGNOSIS — J9600 Acute respiratory failure, unspecified whether with hypoxia or hypercapnia: Secondary | ICD-10-CM

## 2017-12-05 DIAGNOSIS — I82411 Acute embolism and thrombosis of right femoral vein: Secondary | ICD-10-CM | POA: Diagnosis not present

## 2017-12-05 DIAGNOSIS — E1142 Type 2 diabetes mellitus with diabetic polyneuropathy: Secondary | ICD-10-CM | POA: Diagnosis present

## 2017-12-05 DIAGNOSIS — D62 Acute posthemorrhagic anemia: Secondary | ICD-10-CM | POA: Diagnosis not present

## 2017-12-05 DIAGNOSIS — Z419 Encounter for procedure for purposes other than remedying health state, unspecified: Secondary | ICD-10-CM

## 2017-12-05 DIAGNOSIS — N2581 Secondary hyperparathyroidism of renal origin: Secondary | ICD-10-CM | POA: Diagnosis present

## 2017-12-05 DIAGNOSIS — E1121 Type 2 diabetes mellitus with diabetic nephropathy: Secondary | ICD-10-CM | POA: Diagnosis not present

## 2017-12-05 DIAGNOSIS — Z833 Family history of diabetes mellitus: Secondary | ICD-10-CM

## 2017-12-05 DIAGNOSIS — Z22322 Carrier or suspected carrier of Methicillin resistant Staphylococcus aureus: Secondary | ICD-10-CM

## 2017-12-05 DIAGNOSIS — R740 Nonspecific elevation of levels of transaminase and lactic acid dehydrogenase [LDH]: Secondary | ICD-10-CM | POA: Diagnosis not present

## 2017-12-05 DIAGNOSIS — D649 Anemia, unspecified: Secondary | ICD-10-CM | POA: Diagnosis not present

## 2017-12-05 DIAGNOSIS — R7401 Elevation of levels of liver transaminase levels: Secondary | ICD-10-CM

## 2017-12-05 DIAGNOSIS — I5043 Acute on chronic combined systolic (congestive) and diastolic (congestive) heart failure: Secondary | ICD-10-CM | POA: Diagnosis not present

## 2017-12-05 DIAGNOSIS — Z978 Presence of other specified devices: Secondary | ICD-10-CM

## 2017-12-05 DIAGNOSIS — N189 Chronic kidney disease, unspecified: Secondary | ICD-10-CM | POA: Diagnosis not present

## 2017-12-05 DIAGNOSIS — Z91012 Allergy to eggs: Secondary | ICD-10-CM

## 2017-12-05 DIAGNOSIS — H5702 Anisocoria: Secondary | ICD-10-CM | POA: Diagnosis present

## 2017-12-05 DIAGNOSIS — Z7984 Long term (current) use of oral hypoglycemic drugs: Secondary | ICD-10-CM

## 2017-12-05 DIAGNOSIS — S065X9D Traumatic subdural hemorrhage with loss of consciousness of unspecified duration, subsequent encounter: Secondary | ICD-10-CM | POA: Diagnosis not present

## 2017-12-05 DIAGNOSIS — R509 Fever, unspecified: Secondary | ICD-10-CM | POA: Diagnosis not present

## 2017-12-05 DIAGNOSIS — S065X9A Traumatic subdural hemorrhage with loss of consciousness of unspecified duration, initial encounter: Secondary | ICD-10-CM | POA: Diagnosis not present

## 2017-12-05 DIAGNOSIS — Z6831 Body mass index (BMI) 31.0-31.9, adult: Secondary | ICD-10-CM

## 2017-12-05 DIAGNOSIS — R0989 Other specified symptoms and signs involving the circulatory and respiratory systems: Secondary | ICD-10-CM | POA: Diagnosis not present

## 2017-12-05 DIAGNOSIS — I959 Hypotension, unspecified: Secondary | ICD-10-CM | POA: Diagnosis not present

## 2017-12-05 DIAGNOSIS — Z8551 Personal history of malignant neoplasm of bladder: Secondary | ICD-10-CM

## 2017-12-05 DIAGNOSIS — Z881 Allergy status to other antibiotic agents status: Secondary | ICD-10-CM

## 2017-12-05 HISTORY — PX: CRANIOTOMY: SHX93

## 2017-12-05 LAB — COMPREHENSIVE METABOLIC PANEL
ALT: 33 U/L (ref 17–63)
ANION GAP: 22 — AB (ref 5–15)
AST: 60 U/L — ABNORMAL HIGH (ref 15–41)
Albumin: 3.2 g/dL — ABNORMAL LOW (ref 3.5–5.0)
Alkaline Phosphatase: 79 U/L (ref 38–126)
BUN: 69 mg/dL — ABNORMAL HIGH (ref 6–20)
CHLORIDE: 97 mmol/L — AB (ref 101–111)
CO2: 19 mmol/L — AB (ref 22–32)
Calcium: 8.6 mg/dL — ABNORMAL LOW (ref 8.9–10.3)
Creatinine, Ser: 9.39 mg/dL — ABNORMAL HIGH (ref 0.61–1.24)
GFR calc non Af Amer: 5 mL/min — ABNORMAL LOW (ref >60)
GFR, EST AFRICAN AMERICAN: 6 mL/min — AB (ref >60)
Glucose, Bld: 105 mg/dL — ABNORMAL HIGH (ref 65–99)
POTASSIUM: 5.3 mmol/L — AB (ref 3.5–5.1)
SODIUM: 138 mmol/L (ref 135–145)
Total Bilirubin: 0.7 mg/dL (ref 0.3–1.2)
Total Protein: 6.6 g/dL (ref 6.5–8.1)

## 2017-12-05 LAB — I-STAT CHEM 8, ED
BUN: 61 mg/dL — AB (ref 6–20)
CALCIUM ION: 0.97 mmol/L — AB (ref 1.15–1.40)
Chloride: 100 mmol/L — ABNORMAL LOW (ref 101–111)
Creatinine, Ser: 9.8 mg/dL — ABNORMAL HIGH (ref 0.61–1.24)
Glucose, Bld: 106 mg/dL — ABNORMAL HIGH (ref 65–99)
HEMATOCRIT: 32 % — AB (ref 39.0–52.0)
HEMOGLOBIN: 10.9 g/dL — AB (ref 13.0–17.0)
Potassium: 5.2 mmol/L — ABNORMAL HIGH (ref 3.5–5.1)
SODIUM: 137 mmol/L (ref 135–145)
TCO2: 25 mmol/L (ref 22–32)

## 2017-12-05 LAB — BLOOD GAS, ARTERIAL
ACID-BASE DEFICIT: 1.8 mmol/L (ref 0.0–2.0)
Bicarbonate: 21.3 mmol/L (ref 20.0–28.0)
DRAWN BY: 10006
FIO2: 40
O2 SAT: 99.2 %
PCO2 ART: 29.6 mmHg — AB (ref 32.0–48.0)
PEEP: 5 cmH2O
PH ART: 7.47 — AB (ref 7.350–7.450)
Patient temperature: 98.6
RATE: 20 resp/min
VT: 570 mL
pO2, Arterial: 148 mmHg — ABNORMAL HIGH (ref 83.0–108.0)

## 2017-12-05 LAB — CBC
HCT: 28.8 % — ABNORMAL LOW (ref 39.0–52.0)
HEMATOCRIT: 19.1 % — AB (ref 39.0–52.0)
Hemoglobin: 9.6 g/dL — ABNORMAL LOW (ref 13.0–17.0)
Hemoglobin: 6.5 g/dL — CL (ref 13.0–17.0)
MCH: 31 pg (ref 26.0–34.0)
MCH: 31.7 pg (ref 26.0–34.0)
MCHC: 33.3 g/dL (ref 30.0–36.0)
MCHC: 34 g/dL (ref 30.0–36.0)
MCV: 92.9 fL (ref 78.0–100.0)
MCV: 93.2 fL (ref 78.0–100.0)
PLATELETS: 126 10*3/uL — AB (ref 150–400)
PLATELETS: 96 10*3/uL — AB (ref 150–400)
RBC: 3.1 MIL/uL — AB (ref 4.22–5.81)
RBC: 2.05 MIL/uL — ABNORMAL LOW (ref 4.22–5.81)
RDW: 16.2 % — AB (ref 11.5–15.5)
RDW: 16.4 % — AB (ref 11.5–15.5)
WBC: 8.9 10*3/uL (ref 4.0–10.5)
WBC: 5.4 10*3/uL (ref 4.0–10.5)

## 2017-12-05 LAB — GLUCOSE, CAPILLARY: Glucose-Capillary: 127 mg/dL — ABNORMAL HIGH (ref 65–99)

## 2017-12-05 LAB — DIFFERENTIAL
Basophils Absolute: 0 10*3/uL (ref 0.0–0.1)
Basophils Relative: 0 %
EOS ABS: 0.1 10*3/uL (ref 0.0–0.7)
Eosinophils Relative: 1 %
LYMPHS ABS: 1.4 10*3/uL (ref 0.7–4.0)
LYMPHS PCT: 16 %
MONO ABS: 0.7 10*3/uL (ref 0.1–1.0)
Monocytes Relative: 8 %
NEUTROS ABS: 6.7 10*3/uL (ref 1.7–7.7)
NEUTROS PCT: 75 %

## 2017-12-05 LAB — I-STAT ARTERIAL BLOOD GAS, ED
ACID-BASE DEFICIT: 1 mmol/L (ref 0.0–2.0)
Bicarbonate: 25.3 mmol/L (ref 20.0–28.0)
O2 SAT: 100 %
PO2 ART: 322 mmHg — AB (ref 83.0–108.0)
TCO2: 27 mmol/L (ref 22–32)
pCO2 arterial: 47.2 mmHg (ref 32.0–48.0)
pH, Arterial: 7.337 — ABNORMAL LOW (ref 7.350–7.450)

## 2017-12-05 LAB — I-STAT TROPONIN, ED: TROPONIN I, POC: 0.71 ng/mL — AB (ref 0.00–0.08)

## 2017-12-05 LAB — APTT: aPTT: 32 seconds (ref 24–36)

## 2017-12-05 LAB — TRIGLYCERIDES: TRIGLYCERIDES: 49 mg/dL (ref <150)

## 2017-12-05 LAB — PROTIME-INR
INR: 1.38
PROTHROMBIN TIME: 16.8 s — AB (ref 11.4–15.2)

## 2017-12-05 LAB — CBG MONITORING, ED
Glucose-Capillary: 77 mg/dL (ref 65–99)
Glucose-Capillary: 105 mg/dL — ABNORMAL HIGH (ref 65–99)

## 2017-12-05 LAB — MRSA PCR SCREENING: MRSA by PCR: POSITIVE — AB

## 2017-12-05 LAB — FIBRINOGEN: FIBRINOGEN: 338 mg/dL (ref 210–475)

## 2017-12-05 SURGERY — CRANIOTOMY HEMATOMA EVACUATION SUBDURAL
Anesthesia: General | Site: Head

## 2017-12-05 MED ORDER — FENTANYL CITRATE (PF) 100 MCG/2ML IJ SOLN
50.0000 ug | INTRAMUSCULAR | Status: DC | PRN
  Administered 2017-12-07: 100 ug via INTRAVENOUS
  Administered 2017-12-08: 50 ug via INTRAVENOUS

## 2017-12-05 MED ORDER — SODIUM CHLORIDE 0.9 % IV SOLN: Freq: Once | INTRAVENOUS | Status: DC

## 2017-12-05 MED ORDER — SODIUM CHLORIDE 0.9 % IV SOLN
INTRAVENOUS | Status: DC | PRN
  Administered 2017-12-05: 17:00:00 via INTRAVENOUS

## 2017-12-05 MED ORDER — PROPOFOL 1000 MG/100ML IV EMUL
INTRAVENOUS | Status: AC
  Filled 2017-12-05: qty 100

## 2017-12-05 MED ORDER — ONDANSETRON HCL 4 MG/2ML IJ SOLN
INTRAMUSCULAR | Status: AC
  Filled 2017-12-05: qty 2

## 2017-12-05 MED ORDER — ALBUMIN HUMAN 5 % IV SOLN
INTRAVENOUS | Status: DC | PRN
  Administered 2017-12-05: 18:00:00 via INTRAVENOUS

## 2017-12-05 MED ORDER — FENTANYL 2500MCG IN NS 250ML (10MCG/ML) PREMIX INFUSION
0.0000 ug/h | INTRAVENOUS | Status: DC
  Administered 2017-12-05: 100 ug/h via INTRAVENOUS
  Administered 2017-12-06: 225 ug/h via INTRAVENOUS
  Administered 2017-12-06: 200 ug/h via INTRAVENOUS
  Administered 2017-12-06 (×2): 50 ug/h via INTRAVENOUS
  Filled 2017-12-05 (×4): qty 250

## 2017-12-05 MED ORDER — ALBUTEROL SULFATE (2.5 MG/3ML) 0.083% IN NEBU
2.5000 mg | INHALATION_SOLUTION | RESPIRATORY_TRACT | Status: DC | PRN
Start: 2017-12-05 — End: 2017-12-16

## 2017-12-05 MED ORDER — ROCURONIUM BROMIDE 10 MG/ML (PF) SYRINGE
PREFILLED_SYRINGE | INTRAVENOUS | Status: AC
Start: 2017-12-05 — End: 2017-12-05
  Filled 2017-12-05: qty 5

## 2017-12-05 MED ORDER — LIDOCAINE 2% (20 MG/ML) 5 ML SYRINGE
INTRAMUSCULAR | Status: AC
  Filled 2017-12-05: qty 5

## 2017-12-05 MED ORDER — ALTEPLASE 2 MG IJ SOLR: 2.0000 mg | Freq: Once | INTRAMUSCULAR | Status: DC | PRN

## 2017-12-05 MED ORDER — VANCOMYCIN HCL IN DEXTROSE 1-5 GM/200ML-% IV SOLN
INTRAVENOUS | Status: AC
  Filled 2017-12-05: qty 200

## 2017-12-05 MED ORDER — THROMBIN (RECOMBINANT) 20000 UNITS EX SOLR
CUTANEOUS | Status: DC | PRN
  Administered 2017-12-05: 20 mL

## 2017-12-05 MED ORDER — BACITRACIN ZINC 500 UNIT/GM EX OINT
TOPICAL_OINTMENT | CUTANEOUS | Status: AC
  Filled 2017-12-05: qty 28.35

## 2017-12-05 MED ORDER — SODIUM CHLORIDE 0.9 % IV SOLN: 100.0000 mL | INTRAVENOUS | Status: DC | PRN

## 2017-12-05 MED ORDER — DEXAMETHASONE SODIUM PHOSPHATE 10 MG/ML IJ SOLN
INTRAMUSCULAR | Status: DC | PRN
  Administered 2017-12-05: 5 mg via INTRAVENOUS

## 2017-12-05 MED ORDER — ROCURONIUM BROMIDE 10 MG/ML (PF) SYRINGE
PREFILLED_SYRINGE | INTRAVENOUS | Status: DC | PRN
  Administered 2017-12-05: 50 mg via INTRAVENOUS

## 2017-12-05 MED ORDER — PROPOFOL 1000 MG/100ML IV EMUL: 5.0000 ug/kg/min | Freq: Once | INTRAVENOUS | Status: DC

## 2017-12-05 MED ORDER — VANCOMYCIN HCL 1000 MG IV SOLR
INTRAVENOUS | Status: DC | PRN
  Administered 2017-12-05: 1000 mg via INTRAVENOUS

## 2017-12-05 MED ORDER — HEPARIN SODIUM (PORCINE) 1000 UNIT/ML DIALYSIS
7000.0000 [IU] | Freq: Once | INTRAMUSCULAR | Status: DC
  Filled 2017-12-05: qty 7

## 2017-12-05 MED ORDER — HEPARIN SODIUM (PORCINE) 1000 UNIT/ML DIALYSIS: 7000.0000 [IU] | Freq: Once | INTRAMUSCULAR | Status: DC

## 2017-12-05 MED ORDER — HEPARIN SODIUM (PORCINE) 1000 UNIT/ML DIALYSIS: 1000.0000 [IU] | INTRAMUSCULAR | Status: DC | PRN

## 2017-12-05 MED ORDER — LIDOCAINE-EPINEPHRINE 0.5 %-1:200000 IJ SOLN
INTRAMUSCULAR | Status: AC
  Filled 2017-12-05: qty 1

## 2017-12-05 MED ORDER — LIDOCAINE-EPINEPHRINE 0.5 %-1:200000 IJ SOLN
INTRAMUSCULAR | Status: DC | PRN
  Administered 2017-12-05: 20 mL via INTRADERMAL

## 2017-12-05 MED ORDER — ANTICOAGULANT SODIUM CITRATE 4% (200MG/5ML) IV SOLN
5.0000 mL | Status: DC | PRN
  Filled 2017-12-05 (×2): qty 250

## 2017-12-05 MED ORDER — ONDANSETRON HCL 4 MG/2ML IJ SOLN
INTRAMUSCULAR | Status: DC | PRN
  Administered 2017-12-05: 4 mg via INTRAVENOUS

## 2017-12-05 MED ORDER — DEXAMETHASONE SODIUM PHOSPHATE 10 MG/ML IJ SOLN
INTRAMUSCULAR | Status: AC
  Filled 2017-12-05: qty 1

## 2017-12-05 MED ORDER — PHENYLEPHRINE 40 MCG/ML (10ML) SYRINGE FOR IV PUSH (FOR BLOOD PRESSURE SUPPORT)
PREFILLED_SYRINGE | INTRAVENOUS | Status: AC
  Filled 2017-12-05: qty 10

## 2017-12-05 MED ORDER — CHLORHEXIDINE GLUCONATE 0.12% ORAL RINSE (MEDLINE KIT)
15.0000 mL | Freq: Two times a day (BID) | OROMUCOSAL | Status: DC
  Administered 2017-12-05 – 2017-12-08 (×6): 15 mL via OROMUCOSAL

## 2017-12-05 MED ORDER — THROMBIN (RECOMBINANT) 5000 UNITS EX SOLR
CUTANEOUS | Status: DC | PRN
  Administered 2017-12-05: 5 mL

## 2017-12-05 MED ORDER — PANTOPRAZOLE SODIUM 40 MG IV SOLR
40.0000 mg | Freq: Every day | INTRAVENOUS | Status: DC
  Administered 2017-12-05 – 2017-12-09 (×5): 40 mg via INTRAVENOUS
  Filled 2017-12-05 (×3): qty 40

## 2017-12-05 MED ORDER — BACITRACIN ZINC 500 UNIT/GM EX OINT
TOPICAL_OINTMENT | CUTANEOUS | Status: DC | PRN
  Administered 2017-12-05: 1 via TOPICAL

## 2017-12-05 MED ORDER — FENTANYL CITRATE (PF) 100 MCG/2ML IJ SOLN
50.0000 ug | INTRAMUSCULAR | Status: DC | PRN
  Administered 2017-12-06: 50 ug via INTRAVENOUS

## 2017-12-05 MED ORDER — PROPOFOL 1000 MG/100ML IV EMUL: 0.0000 ug/kg/min | INTRAVENOUS | Status: DC

## 2017-12-05 MED ORDER — PENTAFLUOROPROP-TETRAFLUOROETH EX AERO: 1.0000 "application " | INHALATION_SPRAY | CUTANEOUS | Status: DC | PRN

## 2017-12-05 MED ORDER — EPHEDRINE 5 MG/ML INJ
INTRAVENOUS | Status: AC
  Filled 2017-12-05: qty 10

## 2017-12-05 MED ORDER — SODIUM CHLORIDE 0.9 % IV SOLN
INTRAVENOUS | Status: DC | PRN
  Administered 2017-12-05: 16:00:00 via INTRAVENOUS

## 2017-12-05 MED ORDER — FENTANYL CITRATE (PF) 100 MCG/2ML IJ SOLN
INTRAMUSCULAR | Status: DC | PRN
  Administered 2017-12-05: 150 ug via INTRAVENOUS

## 2017-12-05 MED ORDER — PROPOFOL 10 MG/ML IV BOLUS
INTRAVENOUS | Status: DC | PRN
  Administered 2017-12-05: 70 mg via INTRAVENOUS

## 2017-12-05 MED ORDER — ETOMIDATE 2 MG/ML IV SOLN
INTRAVENOUS | Status: AC | PRN
  Administered 2017-12-05: 20 mg via INTRAVENOUS

## 2017-12-05 MED ORDER — LIDOCAINE-PRILOCAINE 2.5-2.5 % EX CREA
1.0000 "application " | TOPICAL_CREAM | CUTANEOUS | Status: DC | PRN
  Filled 2017-12-05: qty 5

## 2017-12-05 MED ORDER — SODIUM CHLORIDE 0.9 % IV SOLN
250.0000 mL | INTRAVENOUS | Status: DC | PRN
  Administered 2017-12-07: 500 mL via INTRAVENOUS

## 2017-12-05 MED ORDER — LIDOCAINE HCL (PF) 1 % IJ SOLN: 5.0000 mL | INTRAMUSCULAR | Status: DC | PRN

## 2017-12-05 MED ORDER — INSULIN ASPART 100 UNIT/ML ~~LOC~~ SOLN
2.0000 [IU] | SUBCUTANEOUS | Status: DC
  Administered 2017-12-05 – 2017-12-09 (×6): 2 [IU] via SUBCUTANEOUS
  Administered 2017-12-09: 4 [IU] via SUBCUTANEOUS
  Administered 2017-12-10 (×2): 2 [IU] via SUBCUTANEOUS

## 2017-12-05 MED ORDER — FENTANYL CITRATE (PF) 250 MCG/5ML IJ SOLN
INTRAMUSCULAR | Status: AC
  Filled 2017-12-05: qty 5

## 2017-12-05 MED ORDER — 0.9 % SODIUM CHLORIDE (POUR BTL) OPTIME
TOPICAL | Status: DC | PRN
  Administered 2017-12-05 (×2): 1000 mL

## 2017-12-05 MED ORDER — ONDANSETRON HCL 4 MG/2ML IJ SOLN
4.0000 mg | Freq: Once | INTRAMUSCULAR | Status: DC
  Filled 2017-12-05: qty 2

## 2017-12-05 MED ORDER — ORAL CARE MOUTH RINSE
15.0000 mL | OROMUCOSAL | Status: DC
  Administered 2017-12-05 – 2017-12-08 (×26): 15 mL via OROMUCOSAL

## 2017-12-05 MED ORDER — PROPOFOL 10 MG/ML IV BOLUS
INTRAVENOUS | Status: AC
  Filled 2017-12-05: qty 20

## 2017-12-05 MED ORDER — MIDAZOLAM HCL 2 MG/2ML IJ SOLN
INTRAMUSCULAR | Status: AC
  Administered 2017-12-05: 4 mg
  Filled 2017-12-05: qty 4

## 2017-12-05 MED ORDER — THROMBIN 5000 UNITS EX SOLR
CUTANEOUS | Status: AC
  Filled 2017-12-05: qty 5000

## 2017-12-05 MED ORDER — SUCCINYLCHOLINE CHLORIDE 20 MG/ML IJ SOLN
INTRAMUSCULAR | Status: AC | PRN
  Administered 2017-12-05: 150 mg via INTRAVENOUS

## 2017-12-05 MED ORDER — PHENYLEPHRINE 40 MCG/ML (10ML) SYRINGE FOR IV PUSH (FOR BLOOD PRESSURE SUPPORT)
PREFILLED_SYRINGE | INTRAVENOUS | Status: DC | PRN
  Administered 2017-12-05: 40 ug via INTRAVENOUS
  Administered 2017-12-05: 160 ug via INTRAVENOUS
  Administered 2017-12-05: 200 ug via INTRAVENOUS

## 2017-12-05 MED ORDER — PHENYLEPHRINE HCL 10 MG/ML IJ SOLN
INTRAMUSCULAR | Status: DC | PRN
  Administered 2017-12-05: 100 ug/min via INTRAVENOUS

## 2017-12-05 MED ORDER — THROMBIN 20000 UNITS EX SOLR
CUTANEOUS | Status: AC
  Filled 2017-12-05: qty 20000

## 2017-12-05 MED ORDER — NEPRO/CARBSTEADY PO LIQD
237.0000 mL | ORAL | Status: DC | PRN
  Filled 2017-12-05: qty 237

## 2017-12-05 MED ORDER — EPHEDRINE SULFATE-NACL 50-0.9 MG/10ML-% IV SOSY
PREFILLED_SYRINGE | INTRAVENOUS | Status: DC | PRN
  Administered 2017-12-05: 15 mg via INTRAVENOUS
  Administered 2017-12-05: 10 mg via INTRAVENOUS

## 2017-12-05 SURGICAL SUPPLY — 69 items
APL SKNCLS STERI-STRIP NONHPOA (GAUZE/BANDAGES/DRESSINGS)
BENZOIN TINCTURE PRP APPL 2/3 (GAUZE/BANDAGES/DRESSINGS) IMPLANT
BLADE CLIPPER SURG (BLADE) ×3 IMPLANT
BLADE ULTRA TIP 2M (BLADE) ×1 IMPLANT
BNDG CMPR 75X41 PLY ABS (GAUZE/BANDAGES/DRESSINGS) ×1
BNDG GAUZE ELAST 4 BULKY (GAUZE/BANDAGES/DRESSINGS) ×4 IMPLANT
BNDG STRETCH 4X75 NS LF (GAUZE/BANDAGES/DRESSINGS) ×2 IMPLANT
BUR ACORN 6.0 PRECISION (BURR) ×2 IMPLANT
BUR ACORN 6.0MM PRECISION (BURR) ×1
BUR MATCHSTICK NEURO 3.0 LAGG (BURR) ×2 IMPLANT
BUR SPIRAL ROUTER 2.3 (BUR) ×1 IMPLANT
BUR SPIRAL ROUTER 2.3MM (BUR) ×1
CANISTER SUCT 3000ML PPV (MISCELLANEOUS) ×3 IMPLANT
CARTRIDGE OIL MAESTRO DRILL (MISCELLANEOUS) ×1 IMPLANT
CLIP VESOCCLUDE MED 6/CT (CLIP) IMPLANT
DIFFUSER DRILL AIR PNEUMATIC (MISCELLANEOUS) ×3 IMPLANT
DRAPE NEUROLOGICAL W/INCISE (DRAPES) ×3 IMPLANT
DRAPE SURG 17X23 STRL (DRAPES) IMPLANT
DRAPE WARM FLUID 44X44 (DRAPE) ×3 IMPLANT
DURAPREP 6ML APPLICATOR 50/CS (WOUND CARE) ×1 IMPLANT
ELECT REM PT RETURN 9FT ADLT (ELECTROSURGICAL) ×3
ELECTRODE REM PT RTRN 9FT ADLT (ELECTROSURGICAL) ×1 IMPLANT
EVACUATOR 1/8 PVC DRAIN (DRAIN) IMPLANT
EVACUATOR SILICONE 100CC (DRAIN) IMPLANT
GAUZE SPONGE 4X4 12PLY STRL (GAUZE/BANDAGES/DRESSINGS) ×3 IMPLANT
GAUZE SPONGE 4X4 16PLY XRAY LF (GAUZE/BANDAGES/DRESSINGS) IMPLANT
GLOVE BIO SURGEON STRL SZ7 (GLOVE) ×4 IMPLANT
GLOVE BIOGEL PI IND STRL 7.5 (GLOVE) IMPLANT
GLOVE BIOGEL PI INDICATOR 7.5 (GLOVE) ×4
GLOVE ECLIPSE 6.5 STRL STRAW (GLOVE) ×3 IMPLANT
GLOVE EXAM NITRILE LRG STRL (GLOVE) IMPLANT
GLOVE EXAM NITRILE XL STR (GLOVE) IMPLANT
GLOVE EXAM NITRILE XS STR PU (GLOVE) IMPLANT
GOWN STRL REUS W/ TWL LRG LVL3 (GOWN DISPOSABLE) ×2 IMPLANT
GOWN STRL REUS W/ TWL XL LVL3 (GOWN DISPOSABLE) IMPLANT
GOWN STRL REUS W/TWL 2XL LVL3 (GOWN DISPOSABLE) IMPLANT
GOWN STRL REUS W/TWL LRG LVL3 (GOWN DISPOSABLE) ×6
GOWN STRL REUS W/TWL XL LVL3 (GOWN DISPOSABLE)
GRAFT DURAGEN MATRIX 2WX2L ×4 IMPLANT
HEMOSTAT SURGICEL 2X14 (HEMOSTASIS) IMPLANT
KIT BASIN OR (CUSTOM PROCEDURE TRAY) ×3 IMPLANT
KIT ROOM TURNOVER OR (KITS) ×3 IMPLANT
NDL HYPO 25X1 1.5 SAFETY (NEEDLE) ×1 IMPLANT
NEEDLE HYPO 25X1 1.5 SAFETY (NEEDLE) ×3 IMPLANT
NS IRRIG 1000ML POUR BTL (IV SOLUTION) ×9 IMPLANT
OIL CARTRIDGE MAESTRO DRILL (MISCELLANEOUS) ×3
PACK CRANIOTOMY CUSTOM (CUSTOM PROCEDURE TRAY) ×3 IMPLANT
PATTIES SURGICAL .5 X.5 (GAUZE/BANDAGES/DRESSINGS) IMPLANT
PATTIES SURGICAL .5 X3 (DISPOSABLE) IMPLANT
PATTIES SURGICAL 1X1 (DISPOSABLE) IMPLANT
PLATE 1.5/0.5 13MM BURR HOLE (Plate) ×8 IMPLANT
SCREW SELF DRILL HT 1.5/4MM (Screw) ×30 IMPLANT
SPONGE NEURO XRAY DETECT 1X3 (DISPOSABLE) IMPLANT
SPONGE SURGIFOAM ABS GEL 100 (HEMOSTASIS) ×3 IMPLANT
STAPLER VISISTAT 35W (STAPLE) ×3 IMPLANT
SUT ETHILON 3 0 FSL (SUTURE) IMPLANT
SUT ETHILON 3 0 PS 1 (SUTURE) IMPLANT
SUT NURALON 4 0 TR CR/8 (SUTURE) ×9 IMPLANT
SUT STEEL 0 (SUTURE)
SUT STEEL 0 18XMFL TIE 17 (SUTURE) IMPLANT
SUT VIC AB 2-0 CT2 18 VCP726D (SUTURE) ×10 IMPLANT
TAPE CLOTH 1X10 TAN NS (GAUZE/BANDAGES/DRESSINGS) ×2 IMPLANT
TOWEL GREEN STERILE (TOWEL DISPOSABLE) ×3 IMPLANT
TOWEL GREEN STERILE FF (TOWEL DISPOSABLE) ×3 IMPLANT
TRAY FOLEY W/METER SILVER 16FR (SET/KITS/TRAYS/PACK) ×1 IMPLANT
TUBE CONNECTING 12'X1/4 (SUCTIONS) ×1
TUBE CONNECTING 12X1/4 (SUCTIONS) ×2 IMPLANT
UNDERPAD 30X30 (UNDERPADS AND DIAPERS) ×3 IMPLANT
WATER STERILE IRR 1000ML POUR (IV SOLUTION) ×3 IMPLANT

## 2017-12-05 NOTE — Progress Notes (Signed)
A line waveform has a whip, verbal order from Albert Stanley to go off cuff pressure.

## 2017-12-05 NOTE — Progress Notes (Signed)
HD tx completed _0  w/ access issues throughout tx, UF goal met, blood rinsed back, VSS, pt d/c home from unit in stable condition via w/c w/ wife

## 2017-12-05 NOTE — ED Notes (Addendum)
Pt arrives via [facility] for stroke-like symptoms; LSN 1400 today; family reports pt fell yesterday and was sent to ER and discharged home, fell again today (this morning) wife helped him out of the floor and pt began c/o headache, having shallow resp, then became only responsive to pain, OPA inserted by first responders and NRB applied, pt then vomited x 2 and OPA removed, assisted ventilations with BVM Pt arrived to [facility] ED TRA A, Neuro Hospitalist at bedside assessing neuro status, R pupil 64mm and L pinpoint 1st vital signs obtained, pt still on NRB at 15L/min-- ED phys determined to go to CT  Pt in CT lying supine on CT table and began vomiting while CT techs preparing patient for exam Pt rolled onto L side, oral suction applied, pt stopped vomiting, 4mg  zofran given via IV  CT scan completed and patient brought back to TRA A to intubate 20mg  etomidate and 150mg  succinylcholine given for intubation

## 2017-12-05 NOTE — ED Notes (Signed)
OG placement attempted x 2 without success

## 2017-12-05 NOTE — Transfer of Care (Signed)
Immediate Anesthesia Transfer of Care Note  Patient: Albert Stanley  Procedure(s) Performed: CRANIOTOMY HEMATOMA EVACUATION SUBDURAL (N/A Head)  Patient Location: ICU  Anesthesia Type:General  Level of Consciousness: Patient remains intubated per anesthesia plan  Airway & Oxygen Therapy: Patient remains intubated per anesthesia plan and Patient placed on Ventilator (see vital sign flow sheet for setting)  Post-op Assessment: Report given to RN  Post vital signs: Reviewed and stable  Last Vitals:  Vitals:   12/05/17 1852 12/05/17 1900  BP:  104/61  Pulse: 83 (!) 55  Resp: 18   SpO2:  100%    Last Pain: There were no vitals filed for this visit.       Complications: No apparent anesthesia complications

## 2017-12-05 NOTE — Progress Notes (Signed)
CRITICAL VALUE ALERT  Critical Value:  Hgb 6.4  Date & Time Notied:  2/24 2010  Provider Notified: Hayden Pedro  NP  Orders Received/Actions taken: Type and Screen and blood transfusion  Blood consent obtained at this time from wife. 2/24 2015

## 2017-12-05 NOTE — Anesthesia Preprocedure Evaluation (Addendum)
Anesthesia Evaluation  Patient identified by MRN, date of birth, ID band Patient unresponsive    Reviewed: Allergy & Precautions, NPO status , Patient's Chart, lab work & pertinent test results  Airway Mallampati: Intubated       Dental   Pulmonary asthma , former smoker,    Pulmonary exam normal    + intubated    Cardiovascular hypertension, +CHF and + DVT  Normal cardiovascular exam  ECG: SR, rate 90. LBBB  ECHO: LV EF: 25% -   30%   Neuro/Psych left-sided weakness negative psych ROS   GI/Hepatic Neg liver ROS, GERD  ,  Endo/Other  negative endocrine ROSdiabetes  Renal/GU Dialysis and ESRFRenal disease     Musculoskeletal negative musculoskeletal ROS (+)   Abdominal   Peds  Hematology  (+) anemia , HLD   Anesthesia Other Findings subdural hematoma  Reproductive/Obstetrics                            Anesthesia Physical Anesthesia Plan  ASA: IV and emergent  Anesthesia Plan: General   Post-op Pain Management:    Induction: Intravenous  PONV Risk Score and Plan: 2 and Ondansetron, Dexamethasone and Treatment may vary due to age or medical condition  Airway Management Planned: Oral ETT  Additional Equipment: Arterial line  Intra-op Plan:   Post-operative Plan: Post-operative intubation/ventilation  Informed Consent: I have reviewed the patients History and Physical, chart, labs and discussed the procedure including the risks, benefits and alternatives for the proposed anesthesia with the patient or authorized representative who has indicated his/her understanding and acceptance.   Dental advisory given  Plan Discussed with: CRNA  Anesthesia Plan Comments:        Anesthesia Quick Evaluation

## 2017-12-05 NOTE — Anesthesia Postprocedure Evaluation (Signed)
Anesthesia Post Note  Patient: Albert Stanley  Procedure(s) Performed: CRANIOTOMY HEMATOMA EVACUATION SUBDURAL (N/A Head)     Patient location during evaluation: ICU Anesthesia Type: General Level of consciousness: sedated Pain management: pain level controlled Vital Signs Assessment: post-procedure vital signs reviewed and stable Respiratory status: patient remains intubated per anesthesia plan Cardiovascular status: stable Postop Assessment: no apparent nausea or vomiting Anesthetic complications: no    Last Vitals:  Vitals:   12/05/17 1852 12/05/17 1900  BP:  104/61  Pulse: 83 (!) 55  Resp: 18   SpO2:  100%    Last Pain: There were no vitals filed for this visit.               Kona Yusuf P Michi Herrmann

## 2017-12-05 NOTE — ED Notes (Signed)
Pt to OR.

## 2017-12-05 NOTE — ED Provider Notes (Addendum)
[facility] PERIOPERATIVE AREA Provider Note   CSN: [id number] Arrival date & time: 12/05/17  1520     History   Chief Complaint No chief complaint on file.   HPI Albert Stanley is a 72 y.o. male.  72 yo M with a chief complaint of vomiting left-sided weakness.  The patient had a fall earlier today.  The family is unsure exactly why he fell.  He had been complaining of a right-sided headache.  When EMS arrived the patient was significantly hypoxic he had one pupil that was larger than the other.  He was brought lights and sirens they were unable to intubate him in route due to the hypoxia.  Level 5 caveat acuity of condition.   The history is provided by the patient.  Illness  This is a new problem. The current episode started 6 to 12 hours ago. The problem occurs constantly. The problem has been rapidly worsening. Pertinent negatives include no chest pain, no abdominal pain, no headaches and no shortness of breath. Nothing aggravates the symptoms. Nothing relieves the symptoms. He has tried nothing for the symptoms. The treatment provided no relief.    Past Medical History:  Diagnosis Date  . Arthritis    Osteoarthritis  . Asthma   . Bladder cancer ([place]) dx'd 1990   surg only  . CHF (congestive heart failure) ([place])   . Diabetes mellitus without complication ([place])   . ESRD (end stage renal disease) ([name])    Tues, Thurs, Sat dialysis  . GERD (gastroesophageal reflux disease)   . History of cardiac catheterization    a. LHC 5/17: no obstructive CAD  . History of DVT (deep vein thrombosis)    2003 ish  . History of nuclear stress test    a. Myoview 4/17: EF 39%, inf, inf-lat, apical inf, apical lat, apical scar, intermediate risk  . Hyperlipidemia   . Hypertension   . NICM (nonischemic cardiomyopathy) ([place])    a. Echo 2/17: mod LVH, EF 35-40%, inf-lat and inf-sept HK, mod MR, severe LAE, small pericardial effusion    Patient Active Problem List   Diagnosis Date Noted   . Hyperkalemia 12/02/2017  . Malnutrition of moderate degree 12/18/2016  . PVC's (premature ventricular contractions)   . Anemia associated with chronic renal failure   . End-stage renal disease on hemodialysis ([place]) 12/09/2016  . Dyslipidemia associated with type 2 diabetes mellitus ([place]) 12/09/2016  . Acute on chronic systolic and diastolic heart failure, NYHA class 1 ([place]) 12/09/2016  . 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 Type 2 diabetes mellitus ([place]) 12/11/2015  . Fluid overload 12/06/2015  . History of DVT (deep vein thrombosis) 12/04/2015  . Renal hematoma 12/04/2015  . H/O: gout 11/16/2013  . Trochanteric bursitis 10/17/2013  . 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 Lumbar degenerative disc disease 07/17/2013    Past Surgical History:  Procedure Laterality Date  . arthroscopic knee surgery Left   . AV FISTULA PLACEMENT Left 12/13/2015   Procedure: ARTERIOVENOUS (AV) FISTULA CREATION;  Surgeon: Angelia Mould, MD;  Location: [facility];  Service: Vascular;  Laterality: Left;  . BACK SURGERY     2 times  1960  . CARDIAC CATHETERIZATION N/A 02/28/2016   Procedure: Left Heart Cath and Coronary Angiography;  Surgeon: Larey Dresser, MD;  Location: [facility];  Service: Cardiovascular;  Laterality: N/A;  . CYSTOSCOPY    . FISTULA SUPERFICIALIZATION Left 08/14/2016   Procedure:  FISTULA SUPERFICIALIZATION LEFT UPPER ARM;  Surgeon: Angelia Mould, MD;  Location: [facility];  Service: Vascular;  Laterality: Left;  . INSERTION OF DIALYSIS CATHETER N/A 12/13/2015   Procedure: INSERTION OF DIALYSIS CATHETER;  Surgeon: Angelia Mould, MD;  Location: [facility];  Service: Vascular;  Laterality: N/A;  . INSERTION OF DIALYSIS CATHETER Left 05/07/2017   Procedure: INSERTION OF DIALYSIS CATHETER LEFT INTERNAL JUGULAR PLACEMENT;  Surgeon: Angelia Mould, MD;  Location: [facility];  Service: Vascular;  Laterality: Left;  . IR  RADIOLOGIST EVAL & MGMT  05/12/2017  . JOINT REPLACEMENT Left    hip  . LIGATION OF COMPETING BRANCHES OF ARTERIOVENOUS FISTULA Left 08/14/2016   Procedure: LIGATION OF COMPETING BRANCHES OF ARTERIOVENOUS FISTULA LEFT UPPER ARM;  Surgeon: Angelia Mould, MD;  Location: [facility];  Service: Vascular;  Laterality: Left;  . PERIPHERAL VASCULAR CATHETERIZATION Left 06/09/2016   Procedure: Fistulagram;  Surgeon: Serafina Mitchell, MD;  Location: [facility];  Service: Cardiovascular;  Laterality: Left;  ARM  . PERIPHERAL VASCULAR CATHETERIZATION Left 06/09/2016   Procedure: Peripheral Vascular Balloon Angioplasty;  Surgeon: Serafina Mitchell, MD;  Location: [facility];  Service: Cardiovascular;  Laterality: Left;  upper arm venous  . PERIPHERAL VASCULAR CATHETERIZATION Left 06/25/2016   Procedure: Fistulagram;  Surgeon: Serafina Mitchell, MD;  Location: [facility];  Service: Cardiovascular;  Laterality: Left;  . REVISON OF ARTERIOVENOUS FISTULA Left 05/07/2017   Procedure: REPAIR OF PSEUDOANEURYSM  LEFT BRACHIO-CEPHALIC  ARM ARTERIOVENOUS FISTULA;  Surgeon: Angelia Mould, MD;  Location: [facility];  Service: Vascular;  Laterality: Left;       Home Medications    Prior to Admission medications   Medication Sig Start Date End Date Taking? Authorizing Provider  albuterol (PROVENTIL HFA;VENTOLIN HFA) 108 (90 Base) MCG/ACT inhaler Inhale 1-2 puffs into the lungs every 6 (six) hours as needed for wheezing or shortness of breath. 12/03/16   Kinnie Feil, PA-C  allopurinol (ZYLOPRIM) 300 MG tablet Take 300 mg by mouth daily.     [provider]  amiodarone (PACERONE) 200 MG tablet Take 1 tablet (200 mg total) by mouth daily. 09/24/17   Larey Dresser, MD  calcitRIOL (ROCALTROL) 0.5 MCG capsule Take 0.5 mcg by mouth Every Tuesday,Thursday,and Saturday with dialysis. Medication given at dialysis on Tues Thurs Sat 11/13/15   [provider]  carvedilol (COREG) 3.125  MG tablet Take 1 tablet (3.125 mg total) by mouth See admin instructions. Take 1 tablet (3.125 mg) by mouth twice daily on Monday, Wednesday, Friday and Sunday (non-dialysis days) Patient not taking: Reported on 12/04/2017 08/14/16   Alvia Grove, PA-C  diphenhydrAMINE (BENADRYL) 25 MG tablet Take 12.5 mg by mouth 2 (two) times daily as needed for itching or allergies.    [provider]  famotidine (PEPCID) 20 MG tablet Take 1 tablet (20 mg total) by mouth 2 (two) times daily as needed (sour stomach). Patient not taking: Reported on 12/04/2017 08/14/16   Virgina Jock A, PA-C  glipiZIDE (GLUCOTROL) 5 MG tablet Take 0.5 tablets (2.5 mg total) by mouth 2 (two) times daily. Patient taking differently: Take 2.5 mg by mouth See admin instructions. Take one tablet in the morning if BS is greater than 80. 08/14/16   Trinh, Kimberly A, PA-C  Menthol, Topical Analgesic, (ICY HOT EX) Apply 1 application topically 3 (three) times daily as needed (pain).    [provider]  multivitamin (RENA-VIT) TABS tablet Take 1  tablet by mouth at bedtime.  12/20/15   [provider]  oxyCODONE-acetaminophen (PERCOCET) 7.5-325 MG tablet Take 1 tablet by mouth 3 (three) times daily as needed (pain). Patient taking differently: Take 1 tablet by mouth 4 (four) times daily as needed (pain).  08/14/16   Alvia Grove, PA-C  sevelamer carbonate (RENVELA) 800 MG tablet Take 2 tablets (1,600 mg total) by mouth 3 (three) times daily with meals. Patient taking differently: Take 2,400 mg by mouth See admin instructions. Take 3 tablets (2400 mg) by mouth daily with meals - usually twice daily 12/18/15   Geradine Girt, DO    Family History Family History  Problem Relation Age of Onset  . Arthritis Mother   . Cancer Father   . Heart disease Father   . Hypertension Sister   . Alcohol abuse Brother   . Diabetes Daughter   . Heart attack Paternal Uncle     Social History Social History   Tobacco  Use  . Smoking status: Former Smoker    Years: 30.00    Last attempt to quit: 10/12/1998    Years since quitting: 19.1  . Smokeless tobacco: Never Used  Substance Use Topics  . Alcohol use: No    Alcohol/week: 0.0 oz    Comment: Used to drink on the weekends, quit 2 years ago  . Drug use: No     Allergies   Meperidine; Sulfa antibiotics; Sulfonamide derivatives; Eggs or egg-derived products; Foltx [elfolate plus]; Lac bovis; Lactose intolerance (gi); Milk-related compounds; Penicillins; and Betadine [povidone iodine]   Review of Systems Review of Systems  Constitutional: Negative for chills and fever.  HENT: Negative for congestion and facial swelling.   Eyes: Negative for discharge and visual disturbance.  Respiratory: Negative for shortness of breath.   Cardiovascular: Negative for chest pain and palpitations.  Gastrointestinal: Negative for abdominal pain, diarrhea and vomiting.  Musculoskeletal: Negative for arthralgias and myalgias.  Skin: Negative for color change and rash.  Neurological: Negative for tremors, syncope and headaches.  Psychiatric/Behavioral: Negative for confusion and dysphoric mood.     Physical Exam Updated Vital Signs BP 118/84   Pulse 91   Resp 18   SpO2 100%   Physical Exam  Constitutional: He appears well-developed and well-nourished. He appears distressed.  HENT:  Head: Normocephalic and atraumatic.  Eyes: EOM are normal.  Right pupil dilated compared to left 39mm to 67mm  Neck: Neck supple. No JVD present.  Cardiovascular: Regular rhythm. Bradycardia present. Exam reveals no gallop and no friction rub.  No murmur heard. Pulmonary/Chest: No respiratory distress. He has no wheezes.  Abdominal: He exhibits distension (mild). He exhibits no mass. There is no tenderness. There is no rebound and no guarding.  Musculoskeletal: Normal range of motion.  Left av fistula with palpable thrill  Neurological: GCS eye subscore is 3. GCS verbal subscore  is 4. GCS motor subscore is 6.  Left sided paralysis  Skin: No rash noted. No pallor.  Psychiatric: He has a normal mood and affect. His behavior is normal.  Nursing note and vitals reviewed.    ED Treatments / Results  Labs (all labs ordered are listed, but only abnormal results are displayed) Labs Reviewed  I-STAT TROPONIN, ED - Abnormal; Notable for the following components:      Result Value   Troponin i, poc 0.71 (*)    All other components within normal limits  I-STAT CHEM 8, ED - Abnormal; Notable for the following components:   Potassium  5.2 (*)    Chloride 100 (*)    BUN 61 (*)    Creatinine, Ser 9.80 (*)    Glucose, Bld 106 (*)    Calcium, Ion 0.97 (*)    Hemoglobin 10.9 (*)    HCT 32.0 (*)    All other components within normal limits  CBG MONITORING, ED - Abnormal; Notable for the following components:   Glucose-Capillary 105 (*)    All other components within normal limits  I-STAT ARTERIAL BLOOD GAS, ED - Abnormal; Notable for the following components:   pH, Arterial 7.337 (*)    pO2, Arterial 322.0 (*)    All other components within normal limits  PROTIME-INR  APTT  CBC  DIFFERENTIAL  COMPREHENSIVE METABOLIC PANEL  CBG MONITORING, ED    EKG  EKG Interpretation None       Radiology Dg Lumbar Spine Complete  Result Date: 12/04/2017 CLINICAL DATA:  Fall yesterday.  Low back pain. EXAM: LUMBAR SPINE - COMPLETE 4+ VIEW COMPARISON:  CT abdomen and pelvis 07/03/2017 FINDINGS: Five non rib-bearing lumbar type vertebral bodies are present. Multilevel degenerative changes are again seen within the lumbar spine. Endplate marrow changes at L2-3 and L4-5 are stable. Multilevel facet degenerative changes are noted. No acute fracture traumatic subluxation is present. Atherosclerotic calcifications are present in the aortic arch without aneurysm. Left hip replacement is noted. IMPRESSION: 1. Stable multilevel degenerative changes in the lumbar spine. 2. No acute  abnormality. Electronically Signed   By: San Morelle M.D.   On: 12/04/2017 15:15   Dg Pelvis 1-2 Views  Result Date: 12/04/2017 CLINICAL DATA:  [place] yesterday.  Pelvic pain. EXAM: PELVIS - 1-2 VIEW COMPARISON:  CT 07/03/2017 FINDINGS: No evidence of acute fracture. Previous total hip replacement on the left. Lucency in the periacetabular bone and proximal femur suggests the possibility of loosening or particle disease. IMPRESSION: No acute or traumatic finding. Lucency in the periacetabular bone and proximal femur suggest the possibility of loosening or particle disease. Electronically Signed   By: Nelson Chimes M.D.   On: 12/04/2017 15:16   Dg Sacrum/coccyx  Result Date: 12/04/2017 CLINICAL DATA:  Fall yesterday. EXAM: SACRUM AND COCCYX - 2+ VIEW COMPARISON:  None. FINDINGS: There is an unusual configuration to the sacrum on the lateral view. A fracture is not excluded. CT imaging could better evaluate. Degenerative changes are seen in the lower lumbar spine. No other acute abnormalities. IMPRESSION: An unusual configuration to the sacrum on the lateral view is identified. A fracture is not excluded. CT imaging could better evaluate. Electronically Signed   By: Dorise Bullion III M.D   On: 12/04/2017 15:17   Ct Head Code Stroke Wo Contrast  Result Date: 12/05/2017 CLINICAL DATA:  Code stroke. Focal neuro deficit, less than 6 hours, stroke suspected. Altered mental status. Asymmetric pupils. Fall. EXAM: CT HEAD WITHOUT CONTRAST TECHNIQUE: Contiguous axial images were obtained from the base of the skull through the vertex without intravenous contrast. COMPARISON:  CT head without contrast 07/03/2017. FINDINGS: Brain: A large right-sided subdural hematoma extends over the convexity measuring 16 mm. There is a focal area of expansion over the high right parietal lobe. This creates significant mass effect with effacement of the sulci and much of the right lateral ventricle. 13 mm midline shift is  evident. The atrium of the left lateral ventricle and temporal tip are mildly expanded. There is uncal herniation. Hemorrhage extends over the right temporal tip. No intraventricular hemorrhage is evident. No significant hemorrhage is present  on the left. Basal ganglia are intact.  No cortical infarct is evident. Vascular: Vascular calcifications are present in the cavernous internal carotid arteries bilaterally. There is no hyperdense vessel. Skull: Calvarium is intact. No focal lytic or blastic lesions are present. Sinuses/Orbits: Mild mucosal thickening present along the floor of the left maxillary sinus. Remaining paranasal sinuses and mastoid air cells are clear. Globes and orbits are within normal limits. ASPECTS [facility] Stroke Program Early CT Score) - Ganglionic level infarction (caudate, lentiform nuclei, internal capsule, insula, M1-M3 cortex): 7/7 - Supraganglionic infarction (M4-M6 cortex): 3/3 Total score (0-10 with 10 being normal): 10/10 IMPRESSION: 1. Large right subdural hematoma measuring up to 16 mm in width with significant mass effect and 13 mm of right-to-left midline shift. 2. Mass effect effaces the sulci and right lateral ventricle. 3. Uncal herniation is present. 4. Mild prominence of the atrium of the left lateral ventricle in left temporal tip may reflect early evidence of a trapped ventricle. 5. ASPECTS is 10/10 Critical Value/emergent results were called by telephone at the time of interpretation on 12/05/2017 at 3:57 pm to Dr. Rory Percy, who verbally acknowledged these results. Electronically Signed   By: San Morelle M.D.   On: 12/05/2017 16:01    Procedures Procedure Name: Intubation Date/Time: 12/05/2017 4:04 PM Performed by: Deno Etienne, DO Pre-anesthesia Checklist: Patient identified, Emergency Drugs available, Suction available, Patient being monitored and Timeout performed Oxygen Delivery Method: Non-rebreather mask Preoxygenation: Pre-oxygenation with 100%  oxygen Induction Type: Rapid sequence Ventilation: Mask ventilation without difficulty Laryngoscope Size: Glidescope Grade View: Grade I Tube size: 7.5 mm Number of attempts: 1 Airway Equipment and Method: Video-laryngoscopy Placement Confirmation: ETT inserted through vocal cords under direct vision,  Positive ETCO2,  CO2 detector and Breath sounds checked- equal and bilateral Secured at: 22 cm Tube secured with: ETT holder Dental Injury: Teeth and Oropharynx as per pre-operative assessment  Difficulty Due To: Difficulty was anticipated and Difficult Airway- due to cervical collar        (including critical care time)  Medications Ordered in ED Medications  ondansetron (ZOFRAN) injection 4 mg ( Intravenous MAR Hold 12/05/17 1626)  propofol (DIPRIVAN) 1000 MG/100ML infusion ( Intravenous MAR Hold 12/05/17 1626)  etomidate (AMIDATE) injection (20 mg Intravenous Given 12/05/17 1549)  succinylcholine (ANECTINE) injection (150 mg Intravenous Given 12/05/17 1549)  midazolam (VERSED) 2 MG/2ML injection (4 mg  Given 12/05/17 1612)     Initial Impression / Assessment and Plan / ED Course  I have reviewed the triage vital signs and the nursing notes.  Pertinent labs & imaging results that were available during my care of the patient were reviewed by me and considered in my medical decision making (see chart for details).     72 yo M with a chief complaint of left-sided weakness and change in speech.  This happened acutely over the past hour.  On arrival to the ED the patient had unequal pupils.  He had some vomiting.  Concerning for an intracranial bleed.  The patient had a initial GCS of 14 therefore intubation was delayed and the patient was urgently brought back for CT scan.  The patient did have a vomiting event while laying flat in the CT scanner.  CT showed a very large right-sided subdural.  Case was discussed with neurosurgery Dr. Valentina Lucks who will come evaluate the patient.  I discussed  the case with the family who felt that at this point everything should be done until the neurosurgeon had a time to evaluate him.  The patient was intubated at bedside.  Neurosurgery to take urgently to OR.    CRITICAL CARE Performed by: Cecilio Asper   Total critical care time: 80 minutes  Critical care time was exclusive of separately billable procedures and treating other patients.  Critical care was necessary to treat or prevent imminent or life-threatening deterioration.  Critical care was time spent personally by me on the following activities: development of treatment plan with patient and/or surrogate as well as nursing, discussions with consultants, evaluation of patient's response to treatment, examination of patient, obtaining history from patient or surrogate, ordering and performing treatments and interventions, ordering and review of laboratory studies, ordering and review of radiographic studies, pulse oximetry and re-evaluation of patient's condition.   The patients results and plan were reviewed and discussed.   Any x-rays performed were independently reviewed by myself.   Differential diagnosis were considered with the presenting HPI.  Medications  ondansetron (ZOFRAN) injection 4 mg ( Intravenous MAR Hold 12/05/17 1626)  propofol (DIPRIVAN) 1000 MG/100ML infusion ( Intravenous MAR Hold 12/05/17 1626)  etomidate (AMIDATE) injection (20 mg Intravenous Given 12/05/17 1549)  succinylcholine (ANECTINE) injection (150 mg Intravenous Given 12/05/17 1549)  midazolam (VERSED) 2 MG/2ML injection (4 mg  Given 12/05/17 1612)    Vitals:   12/05/17 1555 12/05/17 1600 12/05/17 1605 12/05/17 1610  BP: 123/68 128/61 134/66 118/84  Pulse: (!) 103 93 89 91  Resp: 11  16 18   SpO2: 100% 100% 100% 100%    Final diagnoses:  SDH (subdural hematoma) ([name])    Admission/ observation were discussed with the admitting physician, patient and/or family and they are comfortable with the  plan.   Final Clinical Impressions(s) / ED Diagnoses   Final diagnoses:  SDH (subdural hematoma) [facility])    ED Discharge Orders    None       Deno Etienne, DO 12/05/17 1640    Deno Etienne, DO 12/05/17 2046

## 2017-12-05 NOTE — Progress Notes (Signed)
   12/05/17 1900  Clinical Encounter Type  Visited With Family  Visit Type Initial  Referral From Nurse  Consult/Referral To Chaplain  Spiritual Encounters  Spiritual Needs Emotional  Stress Factors  Patient Stress Factors None identified  Family Stress Factors Exhausted    This was a page for code Stemi pt who directly went to OR. I did not meet the pt but I managed to meet the large group of family members. I helped take them to the surgical waiting room. Pt was later taken to this unit, 4N20. I provided compassionate presence to the family.  Nyja Westbrook a Medical sales representative, [employer]

## 2017-12-05 NOTE — Op Note (Signed)
  12/05/2017  8:07 PM  PATIENT:  Albert Stanley  72 y.o. male  Whom after falling has sustained a large panhemispheric subdural hematoma PRE-OPERATIVE DIAGNOSIS:  subdural hematoma, right panhemispheric POST-OPERATIVE DIAGNOSIS:  subdural hematoma, right panhemispheric  PROCEDURE:  Procedure(s): right frontotemporoparietal  CRANIOTOMY HEMATOMA EVACUATION SUBDURAL  SURGEON: Surgeon(s): Ashok Pall, MD  ASSISTANTS:none  ANESTHESIA:   general  EBL:  No intake/output data recorded.  BLOOD ADMINISTERED:none  CELL SAVER GIVEN:none  COUNT:per nursing  DRAINS: none   SPECIMEN:  No Specimen  DICTATION: Albert Stanley was taken to the operating room, and having been intubated in the ED was without difficulty placed under a general anesthetic. His head was shaved. He  was positioned supine on the bed. Once adequate anesthesia was obtained I placed his head in a [place] head holder. I attached his head to the OR bed with the [facility] adapter with his head turned towards the left exposing the right side of his head. I made a reverse question mark incision with a 10 blade. I developed the flap rostrally, divided the temporalis muscle leaving a cuff laterally. I used fish hooks to retract the temporalis muscle and the scalp exposing the frontal, temporal and parietal bones on the right. I used the drill and placed burr holes on the perimeter of my exposed skull. I used the craniotome to connect the burr holes. I elevated the flap, and had vigorous bleeding along the medial exposure of the dura. I controlled this with gelfoam and patties. I opened the dura basing the flap along the medial aspect. I removed a very large blood clot on the surface of the brain. There was no appreciable bleeding after I removed the clot. I irrigated copiously and could not find bleeding points. I then started to close and approximated the dura, and using duragen to cover any openings in the dura. I used burr hole  covers on the skull flap and attached it to the skull with screws. I approximated the scalp and the temporalis muscle with vicryl sutures and the scalp edges with staples. I applied a sterile dressing. I removed the [place] head holder, replaced the head of the bed. He was taken intubated to the NICU.  PLAN OF CARE: Admit to inpatient   PATIENT DISPOSITION:  ICU - intubated and critically ill.   Delay start of Pharmacological VTE agent (>24hrs) due to surgical blood loss or risk of bleeding:  yes

## 2017-12-05 NOTE — Progress Notes (Signed)
Responded to code stroke. Anisocoria and fall was the reason for code stroke. Pt had vomit all over him, Rt pupil 20mm, fixed. Left pupil pinpoint. Localizing to nox stim b/l equally. CTH - large rt sided SDH with SAH component. Emergent NSGY consult. ER to intubate. Further recs per ER.  -- Amie Portland, MD [facility] Pager: [phone] If 7pm to 7am, please call on call as listed on AMION.

## 2017-12-05 NOTE — Progress Notes (Signed)
Patient intubated by ED physician, good color change on ETCO2 detector, good BBS ausculted, SATS 100%,  placed on above vent settings and transported to OR.

## 2017-12-05 NOTE — Consult Note (Signed)
PULMONARY / CRITICAL CARE MEDICINE   Name: Albert Stanley MRN: [id number] DOB: January 03, 1946    ADMISSION DATE:  12/05/2017 CONSULTATION DATE:  12/05/2017  REFERRING MD:  Dr. Christella Noa   CHIEF COMPLAINT:  SDH  HISTORY OF PRESENT ILLNESS:   72 year old male with PMH of ESRD with HD T/R/Sat, GERD, CHF, DM, Remote Bladder Cancer (1990), H/O DVT (not on anticoagulation), HLD, HTN  Presents to ED on 2/23 after fall on 2/22. Denies hitting head. Woke up this morning dizzy and with unsteady gait. CT Head with large ride sided SDH with SAH component. Right Pupil 54mm and fixed. Neurosurgery consulted. Taken to OR for emergent craniotomy for hematoma evacuation. Remained intubated in post-operative states. PCCM consulted.    PAST MEDICAL HISTORY :  He  has a past medical history of Arthritis, Asthma, Bladder cancer ([place]) (dx'd 1990), CHF (congestive heart failure) ([place]), Diabetes mellitus without complication ([place]), ESRD (end stage renal disease) ([place]), GERD (gastroesophageal reflux disease), History of cardiac catheterization, History of DVT (deep vein thrombosis), History of nuclear stress test, Hyperlipidemia, Hypertension, and NICM (nonischemic cardiomyopathy) ([place]).  PAST SURGICAL HISTORY: He  has a past surgical history that includes Back surgery; Joint replacement (Left); arthroscopic knee surgery (Left); Cystoscopy; AV fistula placement (Left, 12/13/2015); Insertion of dialysis catheter (N/A, 12/13/2015); Cardiac catheterization (N/A, 02/28/2016); Cardiac catheterization (Left, 06/09/2016); Cardiac catheterization (Left, 06/09/2016); Cardiac catheterization (Left, 06/25/2016); Fistula superficialization (Left, 08/14/2016); Ligation of competing branches of arteriovenous fistula (Left, 08/14/2016); Revison of arteriovenous fistula (Left, 05/07/2017); Insertion of dialysis catheter (Left, 05/07/2017); and IR Radiologist Eval & Mgmt (05/12/2017).  Allergies  Allergen Reactions  . Meperidine Shortness Of Breath and  Other (See Comments)    Increased heart rate Reaction to demerol  . Sulfa Antibiotics Shortness Of Breath and Other (See Comments)    Increased heart rate  . Sulfonamide Derivatives Shortness Of Breath and Other (See Comments)    Increased heart rate  . Eggs Or Egg-Derived Products Nausea And Vomiting     Reaction to egg yolks - not whites    . Foltx [Elfolate Plus] Other (See Comments)    Pt does not recall reaction to this  . [place] Diarrhea, Nausea And Vomiting and Other (See Comments)    Constipation Reaction to whole milk  . Lactose Intolerance (Gi) Diarrhea, Nausea And Vomiting and Other (See Comments)    Constipation Reaction to whole milk  . Milk-Related Compounds Diarrhea, Nausea And Vomiting and Other (See Comments)    Constipation Reaction to whole milk  . Penicillins Other (See Comments)    "Knocks me out."   Has patient had a PCN reaction causing immediate rash, facial/tongue/throat swelling, SOB or lightheadedness with hypotension: yes- blacked out Has patient had a PCN reaction causing severe rash involving mucus membranes or skin necrosis: unknown Has patient had a PCN reaction that required hospitalization: at MD office Has patient had a PCN reaction occurring within the last 10 years: no If all of the above answers are "NO", then may proceed with Cephalosporin use.   . Betadine [Povidone Iodine] Itching, Rash and Other (See Comments)    burning    No current facility-administered medications on file prior to encounter.    Current Outpatient Medications on File Prior to Encounter  Medication Sig  . albuterol (PROVENTIL HFA;VENTOLIN HFA) 108 (90 Base) MCG/ACT inhaler Inhale 1-2 puffs into the lungs every 6 (six) hours as needed for wheezing or shortness of breath.  . allopurinol (ZYLOPRIM) 300 MG tablet Take 300  mg by mouth daily.   Marland Kitchen amiodarone (PACERONE) 200 MG tablet Take 1 tablet (200 mg total) by mouth daily.  . calcitRIOL (ROCALTROL) 0.5 MCG capsule  Take 0.5 mcg by mouth Every Tuesday,Thursday,and Saturday with dialysis. Medication given at dialysis on Tues Thurs Sat  . carvedilol (COREG) 3.125 MG tablet Take 1 tablet (3.125 mg total) by mouth See admin instructions. Take 1 tablet (3.125 mg) by mouth twice daily on Monday, Wednesday, Friday and Sunday (non-dialysis days) (Patient not taking: Reported on 12/04/2017)  . diphenhydrAMINE (BENADRYL) 25 MG tablet Take 12.5 mg by mouth 2 (two) times daily as needed for itching or allergies.  . famotidine (PEPCID) 20 MG tablet Take 1 tablet (20 mg total) by mouth 2 (two) times daily as needed (sour stomach). (Patient not taking: Reported on 12/04/2017)  . glipiZIDE (GLUCOTROL) 5 MG tablet Take 0.5 tablets (2.5 mg total) by mouth 2 (two) times daily. (Patient taking differently: Take 2.5 mg by mouth See admin instructions. Take one tablet in the morning if BS is greater than 80.)  . Menthol, Topical Analgesic, (ICY HOT EX) Apply 1 application topically 3 (three) times daily as needed (pain).  . multivitamin (RENA-VIT) TABS tablet Take 1 tablet by mouth at bedtime.   Marland Kitchen oxyCODONE-acetaminophen (PERCOCET) 7.5-325 MG tablet Take 1 tablet by mouth 3 (three) times daily as needed (pain). (Patient taking differently: Take 1 tablet by mouth 4 (four) times daily as needed (pain). )  . sevelamer carbonate (RENVELA) 800 MG tablet Take 2 tablets (1,600 mg total) by mouth 3 (three) times daily with meals. (Patient taking differently: Take 2,400 mg by mouth See admin instructions. Take 3 tablets (2400 mg) by mouth daily with meals - usually twice daily)    FAMILY HISTORY:  His indicated that his mother is deceased. He indicated that his father is deceased. He indicated that the status of his sister is unknown. He indicated that the status of his brother is unknown. He indicated that the status of his daughter is unknown. He indicated that the status of his paternal uncle is unknown.   SOCIAL HISTORY: He  reports that he  quit smoking about 19 years ago. He quit after 30.00 years of use. he has never used smokeless tobacco. He reports that he does not drink alcohol or use drugs.  REVIEW OF SYSTEMS:   Unable to review as patient is intubated and sedated   SUBJECTIVE:    VITAL SIGNS: BP 104/61 (BP Location: Right Arm)   Pulse (!) 55   Resp 18   Ht 5\' 9"  (1.753 m)   SpO2 100%   BMI 29.72 kg/m   HEMODYNAMICS:    VENTILATOR SETTINGS: Vent Mode: PRVC FiO2 (%):  [40 %-100 %] 40 % Set Rate:  [20 bmp] 20 bmp Vt Set:  [530 mL-570 mL] 570 mL PEEP:  [5 cmH20] 5 cmH20 Plateau Pressure:  [15 cmH20-19 cmH20] 19 cmH20  INTAKE / OUTPUT: I/O last 3 completed shifts: In: 850 [I.V.:600; IV Piggyback:250] Out: 300 [Blood:300]  PHYSICAL EXAMINATION: General:  Adult male on vent Neuro:  Sedated, pupils intact  HEENT:  Scleral edema, post-op dressing in place  Cardiovascular:  Loletha Grayer, no MRG  Lungs:  Clear breath sounds, no wheeze  Abdomen:  Obese, active bowel sounds  Musculoskeletal:  +2 BLE  Skin:  Warm, dry   LABS:  BMET Recent Labs  Lab 12/02/17 1115 12/04/17 1200 12/05/17 1613 12/05/17 1626  NA 138 136 137 138  K 6.6* 5.7* 5.2* 5.3*  CL 98* 95* 100* 97*  CO2 19* 19*  --  19*  BUN 96* 92* 61* 69*  CREATININE 12.99* 12.17* 9.80* 9.39*  GLUCOSE 74 96 106* 105*    Electrolytes Recent Labs  Lab 12/02/17 1115 12/04/17 1200 12/05/17 1626  CALCIUM 9.1 8.7* 8.6*    CBC Recent Labs  Lab 12/04/17 1200 12/05/17 1613 12/05/17 1626 12/05/17 1915  WBC 5.7  --  8.9 5.4  HGB 9.1* 10.9* 9.6* 6.5*  HCT 26.9* 32.0* 28.8* 19.1*  PLT 163  --  126* 96*    Coag's Recent Labs  Lab 12/05/17 1626  APTT 32  INR 1.38    Sepsis Markers No results for input(s): LATICACIDVEN, PROCALCITON, O2SATVEN in the last 168 hours.  ABG Recent Labs  Lab 12/05/17 1613 12/05/17 2015  PHART 7.337* 7.470*  PCO2ART 47.2 29.6*  PO2ART 322.0* 148*    Liver Enzymes Recent Labs  Lab 12/05/17 1626   AST 60*  ALT 33  ALKPHOS 79  BILITOT 0.7  ALBUMIN 3.2*    Cardiac Enzymes No results for input(s): TROPONINI, PROBNP in the last 168 hours.  Glucose Recent Labs  Lab 12/02/17 1133 12/02/17 1417 12/04/17 1207 12/05/17 1526 12/05/17 1602  GLUCAP 75 80 80 77 105*    Imaging Dg Chest Portable 1 View  Result Date: 12/05/2017 CLINICAL DATA:  Responded to code stroke. Anisocoria and fall was the reason for code stroke.tube placement EXAM: PORTABLE CHEST 1 VIEW COMPARISON:  12/02/2017 FINDINGS: Endotracheal tube tip projects 1.6 cm above the carina. There is a nasal/orogastric tube. This curls within the mid esophagus. Tip is directed back superiorly above the included field of view. Cardiac silhouette is enlarged.  No acute findings in the lungs. IMPRESSION: 1. Endotracheal tube is well positioned. 2. Well-positioned nasal/orogastric tube curling in the mid esophagus. Tip projects above the included field of view, within the neck. 3. Stable cardiomegaly.  No acute findings in the lungs. Electronically Signed   By: Lajean Manes M.D.   On: 12/05/2017 16:41   Ct Head Code Stroke Wo Contrast  Result Date: 12/05/2017 CLINICAL DATA:  Code stroke. Focal neuro deficit, less than 6 hours, stroke suspected. Altered mental status. Asymmetric pupils. Fall. EXAM: CT HEAD WITHOUT CONTRAST TECHNIQUE: Contiguous axial images were obtained from the base of the skull through the vertex without intravenous contrast. COMPARISON:  CT head without contrast 07/03/2017. FINDINGS: Brain: A large right-sided subdural hematoma extends over the convexity measuring 16 mm. There is a focal area of expansion over the high right parietal lobe. This creates significant mass effect with effacement of the sulci and much of the right lateral ventricle. 13 mm midline shift is evident. The atrium of the left lateral ventricle and temporal tip are mildly expanded. There is uncal herniation. Hemorrhage extends over the right  temporal tip. No intraventricular hemorrhage is evident. No significant hemorrhage is present on the left. Basal ganglia are intact.  No cortical infarct is evident. Vascular: Vascular calcifications are present in the cavernous internal carotid arteries bilaterally. There is no hyperdense vessel. Skull: Calvarium is intact. No focal lytic or blastic lesions are present. Sinuses/Orbits: Mild mucosal thickening present along the floor of the left maxillary sinus. Remaining paranasal sinuses and mastoid air cells are clear. Globes and orbits are within normal limits. ASPECTS [facility] Stroke Program Early CT Score) - Ganglionic level infarction (caudate, lentiform nuclei, internal capsule, insula, M1-M3 cortex): 7/7 - Supraganglionic infarction (M4-M6 cortex): 3/3 Total score (0-10 with 10 being normal): 10/10 IMPRESSION:  1. Large right subdural hematoma measuring up to 16 mm in width with significant mass effect and 13 mm of right-to-left midline shift. 2. Mass effect effaces the sulci and right lateral ventricle. 3. Uncal herniation is present. 4. Mild prominence of the atrium of the left lateral ventricle in left temporal tip may reflect early evidence of a trapped ventricle. 5. ASPECTS is 10/10 Critical Value/emergent results were called by telephone at the time of interpretation on 12/05/2017 at 3:57 pm to Dr. Rory Percy, who verbally acknowledged these results. Electronically Signed   By: San Morelle M.D.   On: 12/05/2017 16:01     STUDIES:  CT Head 2/24 > 1. Large right subdural hematoma measuring up to 16 mm in width with significant mass effect and 13 mm of right-to-left midline shift. 2. Mass effect effaces the sulci and right lateral ventricle. 3. Uncal herniation is present. 4. Mild prominence of the atrium of the left lateral ventricle in left temporal tip may reflect early evidence of a trapped ventricle. CXR 2/24 > 1. Endotracheal tube is well positioned. 2. Well-positioned  nasal/orogastric tube curling in the mid esophagus. Tip projects above the included field of view, within the neck. 3. Stable cardiomegaly.  No acute findings in the lungs.  CULTURES: None.   ANTIBIOTICS: None.   SIGNIFICANT EVENTS: 2/23 > Presents to ED  2/24 > Taken to OR   LINES/TUBES: ETT 2/24 >>  DISCUSSION: 72 year old male with mechanical fall on 2/22 presents to ED on 2/23 with weakness and headache. CT Head with large right SDH with 13 mm right to left midline shift. Taken to OR for craniotomy and evacuation of hematoma   ASSESSMENT / PLAN:  PULMONARY A: Respiratory Insufficieny s/p SDH  P:   Vent Support Trend ABG/CXR Pulmonary Hygiene   CARDIOVASCULAR A:  Systolic/Diastolic HF (EF 93-81 on 0/1751)  H/O HTN, HLD, DVT   P:  Cardiac Monitoring  Maintain Systolic <025 and >85  Hold Coreg  RENAL A:   ESRD with HD T/R/S Hyperkalemia  Chronic Foley  P:   Trend BMP > repeat at midnight  Replace electrolytes as indicated  Nephrology following > HD on 2/23  KVO Fluids   GASTROINTESTINAL A:   SUP H/O GERD  P:   NPO PPI   HEMATOLOGIC A:   Acute Blood loss anemia  P:  Trend CBC  Transfuse for Hemoglobin <7 Transfuse 2 units now   INFECTIOUS A:   No issues  P:   Trend WBC and Fever Curve  Change foley out   ENDOCRINE A:   DM    P:   Trend Glucose   Hold glipizide  SSI   NEUROLOGIC A:   SDH with mass effect s/p Craniotomy S/P Mechanical Fall   P:   RASS goal: 0/-1 Per Neurosurgery     FAMILY  - Updates: Family updated at bedside   - Inter-disciplinary family meet or Palliative Care meeting due by: 12/12/2017    Hayden Pedro, AGACNP-BC [facility]  Pgr: [phone]  PCCM Pgr: [phone]

## 2017-12-05 NOTE — H&P (Signed)
Albert Stanley is an 72 y.o. male.   Chief Complaint: comatose,subdural hematoma acute right panhemispheric HPI: Mr. Boivin reported a fall yesterday at home,  while in physicians office Came to ED complaining of headache, back and sacral pain. Mr. Brunty provided the history at ~1444 yesterday.received hemodialysis and was sent home stable with his wife. [facility] [place] again today, this morning per wife. LOC then consistently declined, emesis x2, upon arrival to ED he received a head ct, and was intubated. Currently non responsive. Fixed right pupil. I was consulted for surgical opinion Past Medical History:  Diagnosis Date  . Arthritis    Osteoarthritis  . Asthma   . Bladder cancer ([place]) dx'd 1990   surg only  . CHF (congestive heart failure) ([place])   . Diabetes mellitus without complication ([place])   . ESRD (end stage renal disease) ([name])    Tues, Thurs, Sat dialysis  . GERD (gastroesophageal reflux disease)   . History of cardiac catheterization    a. LHC 5/17: no obstructive CAD  . History of DVT (deep vein thrombosis)    2003 ish  . History of nuclear stress test    a. Myoview 4/17: EF 39%, inf, inf-lat, apical inf, apical lat, apical scar, intermediate risk  . Hyperlipidemia   . Hypertension   . NICM (nonischemic cardiomyopathy) ([place])    a. Echo 2/17: mod LVH, EF 35-40%, inf-lat and inf-sept HK, mod MR, severe LAE, small pericardial effusion    Past Surgical History:  Procedure Laterality Date  . arthroscopic knee surgery Left   . AV FISTULA PLACEMENT Left 12/13/2015   Procedure: ARTERIOVENOUS (AV) FISTULA CREATION;  Surgeon: Angelia Mould, MD;  Location: [facility];  Service: Vascular;  Laterality: Left;  . BACK SURGERY     2 times  1960  . CARDIAC CATHETERIZATION N/A 02/28/2016   Procedure: Left Heart Cath and Coronary Angiography;  Surgeon: Larey Dresser, MD;  Location: [facility];  Service: Cardiovascular;  Laterality: N/A;  . CYSTOSCOPY    . FISTULA  SUPERFICIALIZATION Left 08/14/2016   Procedure: FISTULA SUPERFICIALIZATION LEFT UPPER ARM;  Surgeon: Angelia Mould, MD;  Location: [facility];  Service: Vascular;  Laterality: Left;  . INSERTION OF DIALYSIS CATHETER N/A 12/13/2015   Procedure: INSERTION OF DIALYSIS CATHETER;  Surgeon: Angelia Mould, MD;  Location: [facility];  Service: Vascular;  Laterality: N/A;  . INSERTION OF DIALYSIS CATHETER Left 05/07/2017   Procedure: INSERTION OF DIALYSIS CATHETER LEFT INTERNAL JUGULAR PLACEMENT;  Surgeon: Angelia Mould, MD;  Location: [facility];  Service: Vascular;  Laterality: Left;  . IR RADIOLOGIST EVAL & MGMT  05/12/2017  . JOINT REPLACEMENT Left    hip  . LIGATION OF COMPETING BRANCHES OF ARTERIOVENOUS FISTULA Left 08/14/2016   Procedure: LIGATION OF COMPETING BRANCHES OF ARTERIOVENOUS FISTULA LEFT UPPER ARM;  Surgeon: Angelia Mould, MD;  Location: [facility];  Service: Vascular;  Laterality: Left;  . PERIPHERAL VASCULAR CATHETERIZATION Left 06/09/2016   Procedure: Fistulagram;  Surgeon: Serafina Mitchell, MD;  Location: [facility];  Service: Cardiovascular;  Laterality: Left;  ARM  . PERIPHERAL VASCULAR CATHETERIZATION Left 06/09/2016   Procedure: Peripheral Vascular Balloon Angioplasty;  Surgeon: Serafina Mitchell, MD;  Location: [facility];  Service: Cardiovascular;  Laterality: Left;  upper arm venous  . PERIPHERAL VASCULAR CATHETERIZATION Left 06/25/2016   Procedure: Fistulagram;  Surgeon: Serafina Mitchell, MD;  Location: [facility];  Service: Cardiovascular;  Laterality: Left;  . REVISON  OF ARTERIOVENOUS FISTULA Left 05/07/2017   Procedure: REPAIR OF PSEUDOANEURYSM  LEFT BRACHIO-CEPHALIC  ARM ARTERIOVENOUS FISTULA;  Surgeon: Angelia Mould, MD;  [facility];  Service: Vascular;  Laterality: Left;    Family History  Problem Relation Age of Onset  . Arthritis Mother   . Cancer Father   . Heart disease Father   . Hypertension Sister   . Alcohol abuse  Brother   . Diabetes Daughter   . Heart attack Paternal Uncle    Social History:  reports that he quit smoking about 19 years ago. He quit after 30.00 years of use. he has never used smokeless tobacco. He reports that he does not drink alcohol or use drugs.  Allergies:  Allergies  Allergen Reactions  . Meperidine Shortness Of Breath and Other (See Comments)    Increased heart rate Reaction to demerol  . Sulfa Antibiotics Shortness Of Breath and Other (See Comments)    Increased heart rate  . Sulfonamide Derivatives Shortness Of Breath and Other (See Comments)    Increased heart rate  . Eggs Or Egg-Derived Products Nausea And Vomiting     Reaction to egg yolks - not whites    . Foltx [Elfolate Plus] Other (See Comments)    Pt does not recall reaction to this  . Lac Bovis Diarrhea, Nausea And Vomiting and Other (See Comments)    Constipation Reaction to whole milk  . Lactose Intolerance (Gi) Diarrhea, Nausea And Vomiting and Other (See Comments)    Constipation Reaction to whole milk  . Milk-Related Compounds Diarrhea, Nausea And Vomiting and Other (See Comments)    Constipation Reaction to whole milk  . Penicillins Other (See Comments)    "Knocks me out."   Has patient had a PCN reaction causing immediate rash, facial/tongue/throat swelling, SOB or lightheadedness with hypotension: yes- blacked out Has patient had a PCN reaction causing severe rash involving mucus membranes or skin necrosis: unknown Has patient had a PCN reaction that required hospitalization: at MD office Has patient had a PCN reaction occurring within the last 10 years: no If all of the above answers are "NO", then may proceed with Cephalosporin use.   . Betadine [Povidone Iodine] Itching, Rash and Other (See Comments)    burning     (Not in a hospital admission)  Results for orders placed or performed during the hospital encounter of 12/05/17 (from the past 48 hour(s))  CBG monitoring, ED     Status:  None   Collection Time: 12/05/17  3:26 PM  Result Value Ref Range   Glucose-Capillary 77 65 - 99 mg/dL  CBG monitoring, ED     Status: Abnormal   Collection Time: 12/05/17  4:02 PM  Result Value Ref Range   Glucose-Capillary 105 (H) 65 - 99 mg/dL  I-stat troponin, ED     Status: Abnormal   Collection Time: 12/05/17  4:11 PM  Result Value Ref Range   Troponin i, poc 0.71 (HH) 0.00 - 0.08 ng/mL   Comment NOTIFIED PHYSICIAN    Comment 3            Comment: Due to the release kinetics of cTnI, a negative result within the first hours of the onset of symptoms does not rule out myocardial infarction with certainty. If myocardial infarction is still suspected, repeat the test at appropriate intervals.   I-Stat Chem 8, ED     Status: Abnormal   Collection Time: 12/05/17  4:13 PM  Result Value Ref Range  Sodium 137 135 - 145 mmol/L   Potassium 5.2 (H) 3.5 - 5.1 mmol/L   Chloride 100 (L) 101 - 111 mmol/L   BUN 61 (H) 6 - 20 mg/dL   Creatinine, Ser 9.80 (H) 0.61 - 1.24 mg/dL   Glucose, Bld 106 (H) 65 - 99 mg/dL   Calcium, Ion 0.97 (L) 1.15 - 1.40 mmol/L   TCO2 25 22 - 32 mmol/L   Hemoglobin 10.9 (L) 13.0 - 17.0 g/dL   HCT 32.0 (L) 39.0 - 52.0 %  I-Stat arterial blood gas, ED     Status: Abnormal   Collection Time: 12/05/17  4:13 PM  Result Value Ref Range   pH, Arterial 7.337 (L) 7.350 - 7.450   pCO2 arterial 47.2 32.0 - 48.0 mmHg   pO2, Arterial 322.0 (H) 83.0 - 108.0 mmHg   Bicarbonate 25.3 20.0 - 28.0 mmol/L   TCO2 27 22 - 32 mmol/L   O2 Saturation 100.0 %   Acid-base deficit 1.0 0.0 - 2.0 mmol/L   Patient temperature 98.6 F    Collection site RADIAL, ALLEN'S TEST ACCEPTABLE    Drawn by Operator    Sample type ARTERIAL    Dg Lumbar Spine Complete  Result Date: 12/04/2017 CLINICAL DATA:  Fall yesterday.  Low back pain. EXAM: LUMBAR SPINE - COMPLETE 4+ VIEW COMPARISON:  CT abdomen and pelvis 07/03/2017 FINDINGS: Five non rib-bearing lumbar type vertebral bodies are present.  Multilevel degenerative changes are again seen within the lumbar spine. Endplate marrow changes at L2-3 and L4-5 are stable. Multilevel facet degenerative changes are noted. No acute fracture traumatic subluxation is present. Atherosclerotic calcifications are present in the aortic arch without aneurysm. Left hip replacement is noted. IMPRESSION: 1. Stable multilevel degenerative changes in the lumbar spine. 2. No acute abnormality. Electronically Signed   By: San Morelle M.D.   On: 12/04/2017 15:15   Dg Pelvis 1-2 Views  Result Date: 12/04/2017 CLINICAL DATA:  [place] yesterday.  Pelvic pain. EXAM: PELVIS - 1-2 VIEW COMPARISON:  CT 07/03/2017 FINDINGS: No evidence of acute fracture. Previous total hip replacement on the left. Lucency in the periacetabular bone and proximal femur suggests the possibility of loosening or particle disease. IMPRESSION: No acute or traumatic finding. Lucency in the periacetabular bone and proximal femur suggest the possibility of loosening or particle disease. Electronically Signed   By: Nelson Chimes M.D.   On: 12/04/2017 15:16   Dg Sacrum/coccyx  Result Date: 12/04/2017 CLINICAL DATA:  Fall yesterday. EXAM: SACRUM AND COCCYX - 2+ VIEW COMPARISON:  None. FINDINGS: There is an unusual configuration to the sacrum on the lateral view. A fracture is not excluded. CT imaging could better evaluate. Degenerative changes are seen in the lower lumbar spine. No other acute abnormalities. IMPRESSION: An unusual configuration to the sacrum on the lateral view is identified. A fracture is not excluded. CT imaging could better evaluate. Electronically Signed   By: Dorise Bullion III M.D   On: 12/04/2017 15:17   Ct Head Code Stroke Wo Contrast  Result Date: 12/05/2017 CLINICAL DATA:  Code stroke. Focal neuro deficit, less than 6 hours, stroke suspected. Altered mental status. Asymmetric pupils. Fall. EXAM: CT HEAD WITHOUT CONTRAST TECHNIQUE: Contiguous axial images were obtained  from the base of the skull through the vertex without intravenous contrast. COMPARISON:  CT head without contrast 07/03/2017. FINDINGS: Brain: A large right-sided subdural hematoma extends over the convexity measuring 16 mm. There is a focal area of expansion over the high right parietal lobe. This  creates significant mass effect with effacement of the sulci and much of the right lateral ventricle. 13 mm midline shift is evident. The atrium of the left lateral ventricle and temporal tip are mildly expanded. There is uncal herniation. Hemorrhage extends over the right temporal tip. No intraventricular hemorrhage is evident. No significant hemorrhage is present on the left. Basal ganglia are intact.  No cortical infarct is evident. Vascular: Vascular calcifications are present in the cavernous internal carotid arteries bilaterally. There is no hyperdense vessel. Skull: Calvarium is intact. No focal lytic or blastic lesions are present. Sinuses/Orbits: Mild mucosal thickening present along the floor of the left maxillary sinus. Remaining paranasal sinuses and mastoid air cells are clear. Globes and orbits are within normal limits. ASPECTS [facility] Stroke Program Early CT Score) - Ganglionic level infarction (caudate, lentiform nuclei, internal capsule, insula, M1-M3 cortex): 7/7 - Supraganglionic infarction (M4-M6 cortex): 3/3 Total score (0-10 with 10 being normal): 10/10 IMPRESSION: 1. Large right subdural hematoma measuring up to 16 mm in width with significant mass effect and 13 mm of right-to-left midline shift. 2. Mass effect effaces the sulci and right lateral ventricle. 3. Uncal herniation is present. 4. Mild prominence of the atrium of the left lateral ventricle in left temporal tip may reflect early evidence of a trapped ventricle. 5. ASPECTS is 10/10 Critical Value/emergent results were called by telephone at the time of interpretation on 12/05/2017 at 3:57 pm to Dr. Rory Percy, who verbally acknowledged these  results. Electronically Signed   By: San Morelle M.D.   On: 12/05/2017 16:01    Review of Systems  Unable to perform ROS: Intubated    Blood pressure 118/84, pulse 91, resp. rate 18, SpO2 100 %. Physical Exam  Constitutional: He appears well-developed and well-nourished.  HENT:  Head: Normocephalic.  Eyes:  Right pupil non reactive, left pupil round reactive  Cardiovascular: Normal rate and regular rhythm.  Respiratory:  intubated  GI: Soft. Bowel sounds are normal.  Musculoskeletal:  plegic  Neurological: He is unresponsive. A cranial nerve deficit is present.  No response to noxious stimuli Cannot assess sensory exam Cannot assess motor exam     Assessment/Plan Emergent craniotomy for subdural hematoma evacuation. I have spoken with the family and explained that I do not believe he can survive without operative hematoma evacuation and cerebral decompression. He is in grave condition.   Seyon Strader L, MD 12/05/2017, 4:26 PM

## 2017-12-05 NOTE — Anesthesia Procedure Notes (Signed)
Arterial Line Insertion Start/End2/24/2019 4:49 PM Performed by: Jenne Campus, CRNA, CRNA  Patient sedated Right, radial was placed Catheter size: 20 G Hand hygiene performed  and maximum sterile barriers used   Attempts: 1 Procedure performed without using ultrasound guided technique. Following insertion, dressing applied and Biopatch. Post procedure assessment: normal  Patient tolerated the procedure well with no immediate complications.

## 2017-12-06 ENCOUNTER — Encounter (HOSPITAL_COMMUNITY): Payer: Self-pay

## 2017-12-06 ENCOUNTER — Other Ambulatory Visit: Payer: Self-pay

## 2017-12-06 ENCOUNTER — Inpatient Hospital Stay (HOSPITAL_COMMUNITY): Payer: Medicare Other

## 2017-12-06 LAB — CBC
HCT: 18.4 % — ABNORMAL LOW (ref 39.0–52.0)
HCT: 21.3 % — ABNORMAL LOW (ref 39.0–52.0)
HEMOGLOBIN: 7.1 g/dL — AB (ref 13.0–17.0)
Hemoglobin: 6.4 g/dL — CL (ref 13.0–17.0)
MCH: 30.6 pg (ref 26.0–34.0)
MCH: 31.8 pg (ref 26.0–34.0)
MCHC: 34.8 g/dL (ref 30.0–36.0)
MCHC: 33.3 g/dL (ref 30.0–36.0)
MCV: 91.5 fL (ref 78.0–100.0)
MCV: 91.8 fL (ref 78.0–100.0)
PLATELETS: 105 10*3/uL — AB (ref 150–400)
Platelets: 120 10*3/uL — ABNORMAL LOW (ref 150–400)
RBC: 2.01 MIL/uL — ABNORMAL LOW (ref 4.22–5.81)
RBC: 2.32 MIL/uL — ABNORMAL LOW (ref 4.22–5.81)
RDW: 16.2 % — AB (ref 11.5–15.5)
RDW: 16.2 % — AB (ref 11.5–15.5)
WBC: 4.6 10*3/uL (ref 4.0–10.5)
WBC: 6.6 10*3/uL (ref 4.0–10.5)

## 2017-12-06 LAB — BASIC METABOLIC PANEL
Anion gap: 18 — ABNORMAL HIGH (ref 5–15)
BUN: 70 mg/dL — AB (ref 6–20)
CO2: 19 mmol/L — ABNORMAL LOW (ref 22–32)
CREATININE: 9.37 mg/dL — AB (ref 0.61–1.24)
Calcium: 7.7 mg/dL — ABNORMAL LOW (ref 8.9–10.3)
Chloride: 99 mmol/L — ABNORMAL LOW (ref 101–111)
GFR, EST AFRICAN AMERICAN: 6 mL/min — AB (ref >60)
GFR, EST NON AFRICAN AMERICAN: 5 mL/min — AB (ref >60)
Glucose, Bld: 92 mg/dL (ref 65–99)
POTASSIUM: 5.3 mmol/L — AB (ref 3.5–5.1)
SODIUM: 136 mmol/L (ref 135–145)

## 2017-12-06 LAB — PHOSPHORUS: PHOSPHORUS: 8.8 mg/dL — AB (ref 2.5–4.6)

## 2017-12-06 LAB — GLUCOSE, CAPILLARY
GLUCOSE-CAPILLARY: 110 mg/dL — AB (ref 65–99)
GLUCOSE-CAPILLARY: 21 mg/dL — AB (ref 65–99)
GLUCOSE-CAPILLARY: 63 mg/dL — AB (ref 65–99)
GLUCOSE-CAPILLARY: 69 mg/dL (ref 65–99)
GLUCOSE-CAPILLARY: 91 mg/dL (ref 65–99)
Glucose-Capillary: 78 mg/dL (ref 65–99)
Glucose-Capillary: 125 mg/dL — ABNORMAL HIGH (ref 65–99)
Glucose-Capillary: 82 mg/dL (ref 65–99)
Glucose-Capillary: 79 mg/dL (ref 65–99)

## 2017-12-06 LAB — PREPARE RBC (CROSSMATCH)

## 2017-12-06 LAB — PATHOLOGIST SMEAR REVIEW

## 2017-12-06 LAB — MAGNESIUM: Magnesium: 2.4 mg/dL (ref 1.7–2.4)

## 2017-12-06 LAB — TROPONIN I: Troponin I: 0.57 ng/mL (ref <0.03)

## 2017-12-06 MED ORDER — SODIUM CHLORIDE 0.9 % IV BOLUS (SEPSIS)
500.0000 mL | Freq: Once | INTRAVENOUS | Status: AC
  Administered 2017-12-06: 500 mL via INTRAVENOUS

## 2017-12-06 MED ORDER — PATIROMER SORBITEX CALCIUM 8.4 G PO PACK
8.4000 g | PACK | Freq: Every day | ORAL | Status: AC
  Filled 2017-12-06: qty 4

## 2017-12-06 MED ORDER — DEXTROSE 10 % IV SOLN
INTRAVENOUS | Status: DC
  Administered 2017-12-06 – 2017-12-08 (×3): via INTRAVENOUS

## 2017-12-06 MED ORDER — SODIUM CHLORIDE 0.9 % IV SOLN
2.0000 mg/h | INTRAVENOUS | Status: DC
  Administered 2017-12-06: 2 mg/h via INTRAVENOUS
  Administered 2017-12-07: 4 mg/h via INTRAVENOUS
  Administered 2017-12-07: 1 mg/h via INTRAVENOUS
  Administered 2017-12-07: 4 mg/h via INTRAVENOUS
  Filled 2017-12-06 (×3): qty 10

## 2017-12-06 MED ORDER — DEXTROSE 50 % IV SOLN
25.0000 mL | Freq: Once | INTRAVENOUS | Status: AC
  Administered 2017-12-06: 25 mL via INTRAVENOUS

## 2017-12-06 MED ORDER — MIDAZOLAM HCL 2 MG/2ML IJ SOLN
2.0000 mg | Freq: Once | INTRAMUSCULAR | Status: AC
  Administered 2017-12-06: 2 mg via INTRAVENOUS

## 2017-12-06 MED ORDER — MIDAZOLAM HCL 2 MG/2ML IJ SOLN
INTRAMUSCULAR | Status: AC
  Administered 2017-12-06: 2 mg via INTRAVENOUS
  Filled 2017-12-06: qty 2

## 2017-12-06 MED ORDER — DEXTROSE 50 % IV SOLN
INTRAVENOUS | Status: AC
  Administered 2017-12-06: 50 mL via INTRAVENOUS
  Filled 2017-12-06: qty 50

## 2017-12-06 MED ORDER — DEXTROSE 50 % IV SOLN
50.0000 mL | Freq: Once | INTRAVENOUS | Status: AC
  Administered 2017-12-06: 50 mL via INTRAVENOUS

## 2017-12-06 MED ORDER — MIDAZOLAM HCL 2 MG/2ML IJ SOLN: 2.0000 mg | Freq: Once | INTRAMUSCULAR | Status: AC

## 2017-12-06 MED ORDER — SODIUM CHLORIDE 0.9 % IV SOLN
0.0000 ug/min | INTRAVENOUS | Status: DC
  Administered 2017-12-06: 50 ug/min via INTRAVENOUS
  Administered 2017-12-06 (×2): 75 ug/min via INTRAVENOUS
  Administered 2017-12-07: 65 ug/min via INTRAVENOUS
  Administered 2017-12-07 (×2): 75 ug/min via INTRAVENOUS
  Administered 2017-12-07: 20 ug/min via INTRAVENOUS
  Administered 2017-12-08: 15 ug/min via INTRAVENOUS
  Filled 2017-12-06 (×8): qty 1
  Filled 2017-12-06: qty 10
  Filled 2017-12-06 (×2): qty 1

## 2017-12-06 MED ORDER — DEXTROSE 50 % IV SOLN
INTRAVENOUS | Status: AC
  Administered 2017-12-06: 25 mL via INTRAVENOUS
  Filled 2017-12-06: qty 50

## 2017-12-06 MED FILL — Thrombin For Soln 5000 Unit: CUTANEOUS | Qty: 5000 | Status: AC

## 2017-12-06 MED FILL — Thrombin For Soln 20000 Unit: CUTANEOUS | Qty: 1 | Status: AC

## 2017-12-06 NOTE — Progress Notes (Signed)
CRITICAL VALUE ALERT  Critical Value:  Hgb 6.4  Date & Time Notied:  2/25 0530  Provider Notified: Oletta Darter  Orders Received/Actions taken: note: if pt dialyzed today to have blood given then, otherwise no emergent blood needed. Still pending antibody from type and screen for previous order of [facility]

## 2017-12-06 NOTE — Progress Notes (Signed)
PULMONARY / CRITICAL CARE MEDICINE   Name: Albert Stanley MRN: [id number] DOB: 1946/06/24    ADMISSION DATE:  12/05/2017 CONSULTATION DATE: December 05, 2017  REFERRING MD: Emergency room  CHIEF COMPLAINT: Fall with intracranial hemorrhage subdural hematoma  HISTORY OF PRESENT ILLNESS:   72 year old male with PMH of ESRD with HD T/R/Sat, GERD, CHF, DM, Remote Bladder Cancer (1990), H/O DVT (not on anticoagulation), HLD, HTN  Presents to ED on 2/23 after fall on 2/22. Denies hitting head. Woke up this morning dizzy and with unsteady gait. CT Head with large ride sided SDH with SAH component. Right Pupil 32mm and fixed. Neurosurgery consulted. Taken to OR for emergent craniotomy for hematoma evacuation. Remained intubated in post-operative states. PCCM consulted.    PAST MEDICAL HISTORY :  He  has a past medical history of Arthritis, Asthma, Bladder cancer ([place]) (dx'd 1990), CHF (congestive heart failure) ([place]), Diabetes mellitus without complication ([place]), ESRD (end stage renal disease) ([place]), GERD (gastroesophageal reflux disease), History of cardiac catheterization, History of DVT (deep vein thrombosis), History of nuclear stress test, Hyperlipidemia, Hypertension, and NICM (nonischemic cardiomyopathy) ([place]).  PAST SURGICAL HISTORY: He  has a past surgical history that includes Back surgery; Joint replacement (Left); arthroscopic knee surgery (Left); Cystoscopy; AV fistula placement (Left, 12/13/2015); Insertion of dialysis catheter (N/A, 12/13/2015); Cardiac catheterization (N/A, 02/28/2016); Cardiac catheterization (Left, 06/09/2016); Cardiac catheterization (Left, 06/09/2016); Cardiac catheterization (Left, 06/25/2016); Fistula superficialization (Left, 08/14/2016); Ligation of competing branches of arteriovenous fistula (Left, 08/14/2016); Revison of arteriovenous fistula (Left, 05/07/2017); Insertion of dialysis catheter (Left, 05/07/2017); and IR Radiologist Eval & Mgmt (05/12/2017).  Allergies   Allergen Reactions  . Meperidine Shortness Of Breath and Other (See Comments)    Increased heart rate Reaction to demerol  . Sulfa Antibiotics Shortness Of Breath and Other (See Comments)    Increased heart rate  . Sulfonamide Derivatives Shortness Of Breath and Other (See Comments)    Increased heart rate  . Eggs Or Egg-Derived Products Nausea And Vomiting     Reaction to egg yolks - not whites    . Foltx [Elfolate Plus] Other (See Comments)    Pt does not recall reaction to this  . Lac [place] Diarrhea, Nausea And Vomiting and Other (See Comments)    Constipation Reaction to whole milk  . Lactose Intolerance (Gi) Diarrhea, Nausea And Vomiting and Other (See Comments)    Constipation Reaction to whole milk  . Milk-Related Compounds Diarrhea, Nausea And Vomiting and Other (See Comments)    Constipation Reaction to whole milk  . Penicillins Other (See Comments)    "Knocks me out."   Has patient had a PCN reaction causing immediate rash, facial/tongue/throat swelling, SOB or lightheadedness with hypotension: yes- blacked out Has patient had a PCN reaction causing severe rash involving mucus membranes or skin necrosis: unknown Has patient had a PCN reaction that required hospitalization: at MD office Has patient had a PCN reaction occurring within the last 10 years: no If all of the above answers are "NO", then may proceed with Cephalosporin use.   . Betadine [Povidone Iodine] Itching, Rash and Other (See Comments)    burning    No current facility-administered medications on file prior to encounter.    Current Outpatient Medications on File Prior to Encounter  Medication Sig  . acetaminophen (TYLENOL) 500 MG tablet Take 1,000 mg by mouth every 6 (six) hours as needed for mild pain.  Marland Kitchen albuterol (PROVENTIL HFA;VENTOLIN HFA) 108 (90 Base) MCG/ACT inhaler Inhale 1-2 puffs  into the lungs every 6 (six) hours as needed for wheezing or shortness of breath.  . allopurinol (ZYLOPRIM)  300 MG tablet Take 300 mg by mouth daily.   Marland Kitchen amiodarone (PACERONE) 200 MG tablet Take 1 tablet (200 mg total) by mouth daily.  . calcitRIOL (ROCALTROL) 0.5 MCG capsule Take 0.5 mcg by mouth Every Tuesday,Thursday,and Saturday with dialysis. Medication given at dialysis on Tues Thurs Sat  . diphenhydrAMINE (BENADRYL) 25 MG tablet Take 25 mg by mouth 2 (two) times daily as needed for itching or allergies.   Marland Kitchen glipiZIDE (GLUCOTROL) 5 MG tablet Take 0.5 tablets (2.5 mg total) by mouth 2 (two) times daily. (Patient taking differently: Take 2.5 mg by mouth See admin instructions. Take as needed...one tablet in the morning if BS is greater than 80.)  . Menthol, Topical Analgesic, (ICY HOT EX) Apply 1 application topically 3 (three) times daily as needed (pain).  . multivitamin (RENA-VIT) TABS tablet Take 1 tablet by mouth at bedtime.   Marland Kitchen oxyCODONE-acetaminophen (PERCOCET) 7.5-325 MG tablet Take 1 tablet by mouth 3 (three) times daily as needed (pain). (Patient taking differently: Take 1 tablet by mouth 4 (four) times daily as needed (pain). )  . sevelamer carbonate (RENVELA) 800 MG tablet Take 2 tablets (1,600 mg total) by mouth 3 (three) times daily with meals. (Patient taking differently: Take 2,400 mg by mouth See admin instructions. Take 3 tablets (2400 mg) by mouth daily with meals - usually twice daily)  . carvedilol (COREG) 3.125 MG tablet Take 1 tablet (3.125 mg total) by mouth See admin instructions. Take 1 tablet (3.125 mg) by mouth twice daily on Monday, Wednesday, Friday and Sunday (non-dialysis days) (Patient not taking: Reported on 12/04/2017)  . famotidine (PEPCID) 20 MG tablet Take 1 tablet (20 mg total) by mouth 2 (two) times daily as needed (sour stomach). (Patient not taking: Reported on 12/04/2017)    FAMILY HISTORY:  His indicated that his mother is deceased. He indicated that his father is deceased. He indicated that the status of his sister is unknown. He indicated that the status of his  brother is unknown. He indicated that the status of his daughter is unknown. He indicated that the status of his paternal uncle is unknown.   SOCIAL HISTORY: He  reports that he quit smoking about 19 years ago. He quit after 30.00 years of use. he has never used smokeless tobacco. He reports that he does not drink alcohol or use drugs.  REVIEW OF SYSTEMS:   Unable to obtain due to patient's conditions  SUBJECTIVE:  Patient is intubated and agitated.  VITAL SIGNS: BP 107/68   Pulse 68   Temp 98.2 F (36.8 C)   Resp 20   Ht 5\' 9"  (1.753 m)   Wt 211 lb 13.8 oz (96.1 kg)   SpO2 100%   BMI 31.29 kg/m   HEMODYNAMICS:    VENTILATOR SETTINGS: Vent Mode: PRVC FiO2 (%):  [40 %-100 %] 40 % Set Rate:  [20 bmp] 20 bmp Vt Set:  [530 mL-570 mL] 570 mL PEEP:  [5 cmH20] 5 cmH20 Pressure Support:  [5 cmH20] 5 cmH20 Plateau Pressure:  [15 cmH20-20 cmH20] 20 cmH20  INTAKE / OUTPUT: I/O last 3 completed shifts: In: 1057.7 [I.V.:807.7; IV Piggyback:250] Out: 300 [Blood:300]  PHYSICAL EXAMINATION: General:  Adult male on vent Neuro:  Sedated, pupils intact  HEENT:  Scleral edema, post-op dressing in place  Cardiovascular:  Loletha Grayer, no MRG  Lungs:  Clear breath sounds, no wheeze  Abdomen:  Obese, active bowel sounds  Musculoskeletal:  +2 BLE  Skin:  Warm, dry      LABS:  BMET Recent Labs  Lab 12/04/17 1200 12/05/17 1613 12/05/17 1626 12/06/17 0256  NA 136 137 138 136  K 5.7* 5.2* 5.3* 5.3*  CL 95* 100* 97* 99*  CO2 19*  --  19* 19*  BUN 92* 61* 69* 70*  CREATININE 12.17* 9.80* 9.39* 9.37*  GLUCOSE 96 106* 105* 92    Electrolytes Recent Labs  Lab 12/04/17 1200 12/05/17 1626 12/06/17 0256  CALCIUM 8.7* 8.6* 7.7*  MG  --   --  2.4  PHOS  --   --  8.8*    CBC Recent Labs  Lab 12/05/17 1915 12/05/17 2349 12/06/17 0256  WBC 5.4 6.6 4.6  HGB 6.5* 7.1* 6.4*  HCT 19.1* 21.3* 18.4*  PLT 96* 120* 105*    Coag's Recent Labs  Lab 12/05/17 1626  APTT 32   INR 1.38    Sepsis Markers No results for input(s): LATICACIDVEN, PROCALCITON, O2SATVEN in the last 168 hours.  ABG Recent Labs  Lab 12/05/17 1613 12/05/17 2015  PHART 7.337* 7.470*  PCO2ART 47.2 29.6*  PO2ART 322.0* 148*    Liver Enzymes Recent Labs  Lab 12/05/17 1626  AST 60*  ALT 33  ALKPHOS 79  BILITOT 0.7  ALBUMIN 3.2*    Cardiac Enzymes Recent Labs  Lab 12/05/17 2349  TROPONINI 0.57*    Glucose Recent Labs  Lab 12/04/17 1207 12/05/17 1526 12/05/17 1602 12/05/17 2100 12/06/17 0732 12/06/17 1132  GLUCAP 80 77 105* 127* 91 78    Imaging Dg Chest Port 1 View  Result Date: 12/06/2017 CLINICAL DATA:  Hypoxia EXAM: PORTABLE CHEST 1 VIEW COMPARISON:  December 05, 2017 FINDINGS: Endotracheal tube tip is 1.5 cm above the carina. Nasogastric tube has been removed. No pneumothorax. There is left base atelectasis. There is no edema or consolidation. There is cardiomegaly with pulmonary vascularity within normal limits. No adenopathy. There is aortic atherosclerosis. No evident bone lesions. IMPRESSION: Endotracheal tube as described without pneumothorax. Left base atelectasis. Stable cardiomegaly. There is aortic atherosclerosis. Aortic Atherosclerosis (ICD10-I70.0). Electronically Signed   By: Lowella Grip III M.D.   On: 12/06/2017 07:20   Dg Chest Portable 1 View  Result Date: 12/05/2017 CLINICAL DATA:  Responded to code stroke. Anisocoria and fall was the reason for code stroke.tube placement EXAM: PORTABLE CHEST 1 VIEW COMPARISON:  12/02/2017 FINDINGS: Endotracheal tube tip projects 1.6 cm above the carina. There is a nasal/orogastric tube. This curls within the mid esophagus. Tip is directed back superiorly above the included field of view. Cardiac silhouette is enlarged.  No acute findings in the lungs. IMPRESSION: 1. Endotracheal tube is well positioned. 2. Well-positioned nasal/orogastric tube curling in the mid esophagus. Tip projects above the included  field of view, within the neck. 3. Stable cardiomegaly.  No acute findings in the lungs. Electronically Signed   By: Lajean Manes M.D.   On: 12/05/2017 16:41   Ct Head Code Stroke Wo Contrast  Result Date: 12/05/2017 CLINICAL DATA:  Code stroke. Focal neuro deficit, less than 6 hours, stroke suspected. Altered mental status. Asymmetric pupils. Fall. EXAM: CT HEAD WITHOUT CONTRAST TECHNIQUE: Contiguous axial images were obtained from the base of the skull through the vertex without intravenous contrast. COMPARISON:  CT head without contrast 07/03/2017. FINDINGS: Brain: A large right-sided subdural hematoma extends over the convexity measuring 16 mm. There is a focal area of expansion over the high right parietal  lobe. This creates significant mass effect with effacement of the sulci and much of the right lateral ventricle. 13 mm midline shift is evident. The atrium of the left lateral ventricle and temporal tip are mildly expanded. There is uncal herniation. Hemorrhage extends over the right temporal tip. No intraventricular hemorrhage is evident. No significant hemorrhage is present on the left. Basal ganglia are intact.  No cortical infarct is evident. Vascular: Vascular calcifications are present in the cavernous internal carotid arteries bilaterally. There is no hyperdense vessel. Skull: Calvarium is intact. No focal lytic or blastic lesions are present. Sinuses/Orbits: Mild mucosal thickening present along the floor of the left maxillary sinus. Remaining paranasal sinuses and mastoid air cells are clear. Globes and orbits are within normal limits. ASPECTS [facility] Stroke Program Early CT Score) - Ganglionic level infarction (caudate, lentiform nuclei, internal capsule, insula, M1-M3 cortex): 7/7 - Supraganglionic infarction (M4-M6 cortex): 3/3 Total score (0-10 with 10 being normal): 10/10 IMPRESSION: 1. Large right subdural hematoma measuring up to 16 mm in width with significant mass effect and 13 mm of  right-to-left midline shift. 2. Mass effect effaces the sulci and right lateral ventricle. 3. Uncal herniation is present. 4. Mild prominence of the atrium of the left lateral ventricle in left temporal tip may reflect early evidence of a trapped ventricle. 5. ASPECTS is 10/10 Critical Value/emergent results were called by telephone at the time of interpretation on 12/05/2017 at 3:57 pm to Dr. Rory Percy, who verbally acknowledged these results. Electronically Signed   By: San Morelle M.D.   On: 12/05/2017 16:01     STUDIES:  CT Head 2/24 > 1. Large right subdural hematoma measuring up to 16 mm in width with significant mass effect and 13 mm of right-to-left midline shift. 2. Mass effect effaces the sulci and right lateral ventricle. 3. Uncal herniation is present. 4. Mild prominence of the atrium of the left lateral ventricle in left temporal tip may reflect early evidence of a trapped ventricle. CXR 2/24 > 1. Endotracheal tube is well positioned. 2. Well-positioned nasal/orogastric tube curling in the mid esophagus. Tip projects above the included field of view, within the neck. 3. Stable cardiomegaly. No acute findings in the lungs.     CULTURES: None.   ANTIBIOTICS: None.   SIGNIFICANT EVENTS: 2/23 > Presents to ED  2/24 > Taken to OR   LINES/TUBES: ETT 2/24 >>  DISCUSSION: 72 year old male with mechanical fall on 2/22 presents to ED on 2/23 with weakness and headache. CT Head with large right SDH with 13 mm right to left midline shift. Taken to OR for craniotomy and evacuation of hematoma   ASSESSMENT / PLAN:  PULMONARY A: Respiratory Insufficieny s/p SDH  P:   Vent Support Trend ABG/CXR Pulmonary Hygiene Patient has intermittent agitation he is due for dialysis either today or tomorrow he is due for blood transfusion 2 units of blood given his drop in his hemoglobin at this point he is not ready for extubation he has intermittent weakness and waxing and  waning of mental mental status.   CARDIOVASCULAR A:  Systolic/Diastolic HF (EF 01-02 on 04/2535)  H/O HTN, HLD, DVT   P:  Cardiac Monitoring  Maintain Systolic <644 and >03  Hold Coreg  RENAL A:   ESRD with HD T/R/S Hyperkalemia  Chronic Foley  P:   Trend BMP > repeat at midnight  Replace electrolytes as indicated  Nephrology following > HD on 2/23  [facility] Fluids   GASTROINTESTINAL A:  SUP H/O GERD  P:   NPO PPI   HEMATOLOGIC A:   Acute Blood loss anemia  P:  Trend CBC  Transfuse for Hemoglobin <7 Transfuse 2 units now  Patient has drop in his hemoglobin most likely because of the hematoma and bleeding before during and after the OR time he will need to be transfused these autoantibodies at this point waiting for the blood to arrive if it becomes an emergency release blood emergently to transfuse him.  INFECTIOUS A:   No issues  P:   Trend WBC and Fever Curve  Change foley out   ENDOCRINE A:   DM    P:   Trend Glucose   Hold glipizide  SSI   NEUROLOGIC A:   SDH with mass effect s/p Craniotomy S/P Mechanical Fall   P:   RASS goal: 0/-1 Per Neurosurgery     FAMILY  - Updates: Family updated at bedside   - Inter-disciplinary family meet or Palliative Care meeting due by: 12/12/2017   Critical care time is 45 minutes for this patient.  Pulmonary and [place] Pager: [phone]  12/06/2017, 12:04 PM

## 2017-12-06 NOTE — Consult Note (Signed)
[facility] Renal Consultation Note    Indication for Consultation:  Management of ESRD/hemodialysis; anemia, hypertension/volume and secondary hyperparathyroidism  NOI:[id number], Jori Moll, MD  HPI: Albert Stanley is a 72 y.o. male. 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  Past medical history significant for lymphedema, NICM with EF 40%, GERD, h/o DVT not on anticoagulation, h/o renal mass/atypical cyst and bladder Ca s/p resection in 1999.  Patient was admitted for further management of acute right panhemispheric subdural hematoma.  Presented to the ED 1 day ago following multiple falls over the last few days.  History obtained through wife due to patient being intubated.  Per his wife, on 2/22 he had a mechanical fall, denied hitting his head and came to the ED the following day w/ complaints of back pain.  He received iHD due to elevated potassium and was sent home.  Yesterday morning he fell again, unsure of the cause of his fall, soon after wife noticed agitation and a change in speech.  911 was called and patient transported to ED.  In the ED findings included quickly declining MS, fixed R pupil, CT showing large R panhemispheric subdural hematoma with mass effect.  He underwent a R frontotemopoparietal craniotomy hematoma evacuation and is now intubated in the neuro ICU.  Pertinent findings this AM include hypotension, K 5.3, Hgb 6.4 and an unknown antibody in his blood making type and cross more difficult.   Of note from a dialysis standpoint he is overall compliant with prescribed dialysis regimen but recently has missed 4 outpatient treatments in the last 2 weeks.  The last 2 of those 4 treatments were completed here after being seen in the ED.   Per patient family he missed the other regular OP treatments due to nausea, vomiting, and diarrhea.   Past Medical History:  Diagnosis Date  . Arthritis    Osteoarthritis  . Asthma   . Bladder cancer  ([place]) dx'd 1990   surg only  . CHF (congestive heart failure) ([place])   . Diabetes mellitus without complication ([place])   . ESRD (end stage renal disease) ([name])    Tues, Thurs, Sat dialysis  . GERD (gastroesophageal reflux disease)   . History of cardiac catheterization    a. LHC 5/17: no obstructive CAD  . History of DVT (deep vein thrombosis)    2003 ish  . History of nuclear stress test    a. Myoview 4/17: EF 39%, inf, inf-lat, apical inf, apical lat, apical scar, intermediate risk  . Hyperlipidemia   . Hypertension   . NICM (nonischemic cardiomyopathy) ([place])    a. Echo 2/17: mod LVH, EF 35-40%, inf-lat and inf-sept HK, mod MR, severe LAE, small pericardial effusion   Past Surgical History:  Procedure Laterality Date  . arthroscopic knee surgery Left   . AV FISTULA PLACEMENT Left 12/13/2015   Procedure: ARTERIOVENOUS (AV) FISTULA CREATION;  Surgeon: Angelia Mould, MD;  Location: [place];  Service: Vascular;  Laterality: Left;  . BACK SURGERY     2 times  1960  . CARDIAC CATHETERIZATION N/A 02/28/2016   Procedure: Left Heart Cath and Coronary Angiography;  Surgeon: Larey Dresser, MD;  Location: [place] [facility];  Service: Cardiovascular;  Laterality: N/A;  . CYSTOSCOPY    . FISTULA SUPERFICIALIZATION Left 08/14/2016   Procedure: FISTULA SUPERFICIALIZATION LEFT UPPER ARM;  Surgeon: Angelia Mould, MD;  Location: [place];  Service: Vascular;  Laterality: Left;  .  INSERTION OF DIALYSIS CATHETER N/A 12/13/2015   Procedure: INSERTION OF DIALYSIS CATHETER;  Surgeon: Angelia Mould, MD;  Location: [place];  Service: Vascular;  Laterality: N/A;  . INSERTION OF DIALYSIS CATHETER Left 05/07/2017   Procedure: INSERTION OF DIALYSIS CATHETER LEFT INTERNAL JUGULAR PLACEMENT;  Surgeon: Angelia Mould, MD;  Location: [place];  Service: Vascular;  Laterality: Left;  . IR RADIOLOGIST EVAL & MGMT  05/12/2017  . JOINT REPLACEMENT Left    hip  . LIGATION OF COMPETING BRANCHES OF  ARTERIOVENOUS FISTULA Left 08/14/2016   Procedure: LIGATION OF COMPETING BRANCHES OF ARTERIOVENOUS FISTULA LEFT UPPER ARM;  Surgeon: Angelia Mould, MD;  Location: [place];  Service: Vascular;  Laterality: Left;  . PERIPHERAL VASCULAR CATHETERIZATION Left 06/09/2016   Procedure: Fistulagram;  Surgeon: Serafina Mitchell, MD;  Location: [place] [facility];  Service: Cardiovascular;  Laterality: Left;  ARM  . PERIPHERAL VASCULAR CATHETERIZATION Left 06/09/2016   Procedure: Peripheral Vascular Balloon Angioplasty;  Surgeon: Serafina Mitchell, MD;  Location: [place] [facility];  Service: Cardiovascular;  Laterality: Left;  upper arm venous  . PERIPHERAL VASCULAR CATHETERIZATION Left 06/25/2016   Procedure: Fistulagram;  Surgeon: Serafina Mitchell, MD;  Location: [place] [facility];  Service: Cardiovascular;  Laterality: Left;  . REVISON OF ARTERIOVENOUS FISTULA Left 05/07/2017   Procedure: REPAIR OF PSEUDOANEURYSM  LEFT BRACHIO-CEPHALIC  ARM ARTERIOVENOUS FISTULA;  Surgeon: Angelia Mould, MD;  Location: [facility];  Service: Vascular;  Laterality: Left;   Family History  Problem Relation Age of Onset  . Arthritis Mother   . Cancer Father   . Heart disease Father   . Hypertension Sister   . Alcohol abuse Brother   . Diabetes Daughter   . Heart attack Paternal Uncle    Social History:  reports that he quit smoking about 19 years ago. He quit after 30.00 years of use. he has never used smokeless tobacco. He reports that he does not drink alcohol or use drugs. Allergies  Allergen Reactions  . Meperidine Shortness Of Breath and Other (See Comments)    Increased heart rate Reaction to demerol  . Sulfa Antibiotics Shortness Of Breath and Other (See Comments)    Increased heart rate  . Sulfonamide Derivatives Shortness Of Breath and Other (See Comments)    Increased heart rate  . Eggs Or Egg-Derived Products Nausea And Vomiting     Reaction to egg yolks - not whites    . Foltx [Elfolate Plus]  Other (See Comments)    Pt does not recall reaction to this  . [place] Diarrhea, Nausea And Vomiting and Other (See Comments)    Constipation Reaction to whole milk  . Lactose Intolerance (Gi) Diarrhea, Nausea And Vomiting and Other (See Comments)    Constipation Reaction to whole milk  . Milk-Related Compounds Diarrhea, Nausea And Vomiting and Other (See Comments)    Constipation Reaction to whole milk  . Penicillins Other (See Comments)    "Knocks me out."   Has patient had a PCN reaction causing immediate rash, facial/tongue/throat swelling, SOB or lightheadedness with hypotension: yes- blacked out Has patient had a PCN reaction causing severe rash involving mucus membranes or skin necrosis: unknown Has patient had a PCN reaction that required hospitalization: at MD office Has patient had a PCN reaction occurring within the last 10 years: no If all of the above answers are "NO", then may proceed with Cephalosporin use.   . Betadine [Povidone Iodine] Itching, Rash and Other (  See Comments)    burning   Prior to Admission medications   Medication Sig Start Date End Date Taking? Authorizing Provider  albuterol (PROVENTIL HFA;VENTOLIN HFA) 108 (90 Base) MCG/ACT inhaler Inhale 1-2 puffs into the lungs every 6 (six) hours as needed for wheezing or shortness of breath. 12/03/16   Kinnie Feil, PA-C  allopurinol (ZYLOPRIM) 300 MG tablet Take 300 mg by mouth daily.     [provider]  amiodarone (PACERONE) 200 MG tablet Take 1 tablet (200 mg total) by mouth daily. 09/24/17   Larey Dresser, MD  calcitRIOL (ROCALTROL) 0.5 MCG capsule Take 0.5 mcg by mouth Every Tuesday,Thursday,and Saturday with dialysis. Medication given at dialysis on Tues Thurs Sat 11/13/15   [provider]  carvedilol (COREG) 3.125 MG tablet Take 1 tablet (3.125 mg total) by mouth See admin instructions. Take 1 tablet (3.125 mg) by mouth twice daily on Monday, Wednesday, Friday and Sunday  (non-dialysis days) Patient not taking: Reported on 12/04/2017 08/14/16   Alvia Grove, PA-C  diphenhydrAMINE (BENADRYL) 25 MG tablet Take 12.5 mg by mouth 2 (two) times daily as needed for itching or allergies.    [provider]  famotidine (PEPCID) 20 MG tablet Take 1 tablet (20 mg total) by mouth 2 (two) times daily as needed (sour stomach). Patient not taking: Reported on 12/04/2017 08/14/16   Virgina Jock A, PA-C  glipiZIDE (GLUCOTROL) 5 MG tablet Take 0.5 tablets (2.5 mg total) by mouth 2 (two) times daily. Patient taking differently: Take 2.5 mg by mouth See admin instructions. Take one tablet in the morning if BS is greater than 80. 08/14/16   Trinh, Kimberly A, PA-C  Menthol, Topical Analgesic, (ICY HOT EX) Apply 1 application topically 3 (three) times daily as needed (pain).    [provider]  multivitamin (RENA-VIT) TABS tablet Take 1 tablet by mouth at bedtime.  12/20/15   [provider]  oxyCODONE-acetaminophen (PERCOCET) 7.5-325 MG tablet Take 1 tablet by mouth 3 (three) times daily as needed (pain). Patient taking differently: Take 1 tablet by mouth 4 (four) times daily as needed (pain).  08/14/16   Alvia Grove, PA-C  sevelamer carbonate (RENVELA) 800 MG tablet Take 2 tablets (1,600 mg total) by mouth 3 (three) times daily with meals. Patient taking differently: Take 2,400 mg by mouth See admin instructions. Take 3 tablets (2400 mg) by mouth daily with meals - usually twice daily 12/18/15   Geradine Girt, DO   Current Facility-Administered Medications  Medication Dose Route Frequency Provider Last Rate Last Dose  . 0.9 %  sodium chloride infusion  100 mL Intravenous PRN Roney Jaffe, MD      . 0.9 %  sodium chloride infusion  100 mL Intravenous PRN Roney Jaffe, MD      . 0.9 %  sodium chloride infusion  250 mL Intravenous PRN Omar Person, NP   Stopped at 12/06/17 [phone]  . 0.9 %  sodium chloride infusion   Intravenous Once Omar Person, NP   Stopped at 12/06/17 0241  . albuterol (PROVENTIL) (2.5 MG/3ML) 0.083% nebulizer solution 2.5 mg  2.5 mg Nebulization Q4H PRN Hammonds, Sharyn Blitz, MD      . alteplase (CATHFLO ACTIVASE) injection 2 mg  2 mg Intracatheter Once PRN Roney Jaffe, MD      . anticoagulant sodium citrate solution 5 mL  5 mL Dialysis PRN Roney Jaffe, MD      . chlorhexidine gluconate (MEDLINE KIT) (PERIDEX) 0.12 %  solution 15 mL  15 mL Mouth Rinse BID Ashok Pall, MD   15 mL at 12/06/17 0720  . feeding supplement (NEPRO CARB STEADY) liquid 237 mL  237 mL Oral PRN Roney Jaffe, MD      . fentaNYL (SUBLIMAZE) injection 50 mcg  50 mcg Intravenous Q15 min PRN Omar Person, NP      . fentaNYL (SUBLIMAZE) injection 50 mcg  50 mcg Intravenous Q2H PRN Omar Person, NP   50 mcg at 12/06/17 0735  . fentaNYL 2512mg in NS 2544m(1071mml) infusion-PREMIX  0-400 mcg/hr Intravenous Continuous Hammonds, KatSharyn BlitzD 20 mL/hr at 12/06/17 0826 200 mcg/hr at 12/06/17 0826  . heparin injection 1,000 Units  1,000 Units Dialysis PRN SchRoney JaffeD      . heparin injection 7,000 Units  7,000 Units Dialysis Once in dialysis SchRoney JaffeD      . insulin aspart (novoLOG) injection 2-6 Units  2-6 Units Subcutaneous Q4H EubOmar PersonP   2 Units at 12/05/17 2339  . lidocaine (PF) (XYLOCAINE) 1 % injection 5 mL  5 mL Intradermal PRN SchRoney JaffeD      . lidocaine-prilocaine (EMLA) cream 1 application  1 application Topical PRN SchRoney JaffeD      . MEDLINE mouth rinse  15 mL Mouth Rinse 10 times per day CabAshok PallD   15 mL at 12/06/17 0915  . ondansetron (ZOFRAN) injection 4 mg  4 mg Intravenous Once FloTyrone Ninean, DO      . pantoprazole (PROTONIX) injection 40 mg  40 mg Intravenous QHS EubOmar PersonP   40 mg at 12/05/17 2200  . pentafluoroprop-tetrafluoroeth (GEBAUERS) aerosol 1 application  1 application Topical PRN SchRoney JaffeD      . phenylephrine  (NEO-SYNEPHRINE) 10 mg in sodium chloride 0.9 % 250 mL (0.04 mg/mL) infusion  0-400 mcg/min Intravenous Titrated SomAnders SimmondsD       Labs: Basic Metabolic Panel: Recent Labs  Lab 12/04/17 1200 12/05/17 1613 12/05/17 1626 12/06/17 0256  NA 136 137 138 136  K 5.7* 5.2* 5.3* 5.3*  CL 95* 100* 97* 99*  CO2 19*  --  19* 19*  GLUCOSE 96 106* 105* 92  BUN 92* 61* 69* 70*  CREATININE 12.17* 9.80* 9.39* 9.37*  CALCIUM 8.7*  --  8.6* 7.7*  PHOS  --   --   --  8.8*   Liver Function Tests: Recent Labs  Lab 12/05/17 1626  AST 60*  ALT 33  ALKPHOS 79  BILITOT 0.7  PROT 6.6  ALBUMIN 3.2*   CBC: Recent Labs  Lab 12/04/17 1200  12/05/17 1626 12/05/17 1915 12/05/17 2349 12/06/17 0256  WBC 5.7  --  8.9 5.4 6.6 4.6  NEUTROABS  --   --  6.7  --   --   --   HGB 9.1*   < > 9.6* 6.5* 7.1* 6.4*  HCT 26.9*   < > 28.8* 19.1* 21.3* 18.4*  MCV 91.8  --  92.9 93.2 91.8 91.5  PLT 163  --  126* 96* 120* 105*   < > = values in this interval not displayed.   Cardiac Enzymes: Recent Labs  Lab 12/05/17 2349  TROPONINI 0.57*   CBG: Recent Labs  Lab 12/04/17 1207 12/05/17 1526 12/05/17 1602 12/05/17 2100 12/06/17 0732  GLUCAP 80 77 105* 127* 91   Studies/Results: Dg Lumbar Spine Complete  Result Date: 12/04/2017 CLINICAL DATA:  Fall yesterday.  Low back pain.  EXAM: LUMBAR SPINE - COMPLETE 4+ VIEW COMPARISON:  CT abdomen and pelvis 07/03/2017 FINDINGS: Five non rib-bearing lumbar type vertebral bodies are present. Multilevel degenerative changes are again seen within the lumbar spine. Endplate marrow changes at L2-3 and L4-5 are stable. Multilevel facet degenerative changes are noted. No acute fracture traumatic subluxation is present. Atherosclerotic calcifications are present in the aortic arch without aneurysm. Left hip replacement is noted. IMPRESSION: 1. Stable multilevel degenerative changes in the lumbar spine. 2. No acute abnormality. Electronically Signed   By: San Morelle M.D.   On: 12/04/2017 15:15   Dg Pelvis 1-2 Views  Result Date: 12/04/2017 CLINICAL DATA:  [place] yesterday.  Pelvic pain. EXAM: PELVIS - 1-2 VIEW COMPARISON:  CT 07/03/2017 FINDINGS: No evidence of acute fracture. Previous total hip replacement on the left. Lucency in the periacetabular bone and proximal femur suggests the possibility of loosening or particle disease. IMPRESSION: No acute or traumatic finding. Lucency in the periacetabular bone and proximal femur suggest the possibility of loosening or particle disease. Electronically Signed   By: Nelson Chimes M.D.   On: 12/04/2017 15:16   Dg Sacrum/coccyx  Result Date: 12/04/2017 CLINICAL DATA:  Fall yesterday. EXAM: SACRUM AND COCCYX - 2+ VIEW COMPARISON:  None. FINDINGS: There is an unusual configuration to the sacrum on the lateral view. A fracture is not excluded. CT imaging could better evaluate. Degenerative changes are seen in the lower lumbar spine. No other acute abnormalities. IMPRESSION: An unusual configuration to the sacrum on the lateral view is identified. A fracture is not excluded. CT imaging could better evaluate. Electronically Signed   By: Dorise Bullion III M.D   On: 12/04/2017 15:17   Dg Chest Port 1 View  Result Date: 12/06/2017 CLINICAL DATA:  Hypoxia EXAM: PORTABLE CHEST 1 VIEW COMPARISON:  December 05, 2017 FINDINGS: Endotracheal tube tip is 1.5 cm above the carina. Nasogastric tube has been removed. No pneumothorax. There is left base atelectasis. There is no edema or consolidation. There is cardiomegaly with pulmonary vascularity within normal limits. No adenopathy. There is aortic atherosclerosis. No evident bone lesions. IMPRESSION: Endotracheal tube as described without pneumothorax. Left base atelectasis. Stable cardiomegaly. There is aortic atherosclerosis. Aortic Atherosclerosis (ICD10-I70.0). Electronically Signed   By: Lowella Grip III M.D.   On: 12/06/2017 07:20   Dg Chest Portable 1 View  Result  Date: 12/05/2017 CLINICAL DATA:  Responded to code stroke. Anisocoria and fall was the reason for code stroke.tube placement EXAM: PORTABLE CHEST 1 VIEW COMPARISON:  12/02/2017 FINDINGS: Endotracheal tube tip projects 1.6 cm above the carina. There is a nasal/orogastric tube. This curls within the mid esophagus. Tip is directed back superiorly above the included field of view. Cardiac silhouette is enlarged.  No acute findings in the lungs. IMPRESSION: 1. Endotracheal tube is well positioned. 2. Well-positioned nasal/orogastric tube curling in the mid esophagus. Tip projects above the included field of view, within the neck. 3. Stable cardiomegaly.  No acute findings in the lungs. Electronically Signed   By: Lajean Manes M.D.   On: 12/05/2017 16:41   Ct Head Code Stroke Wo Contrast  Result Date: 12/05/2017 CLINICAL DATA:  Code stroke. Focal neuro deficit, less than 6 hours, stroke suspected. Altered mental status. Asymmetric pupils. Fall. EXAM: CT HEAD WITHOUT CONTRAST TECHNIQUE: Contiguous axial images were obtained from the base of the skull through the vertex without intravenous contrast. COMPARISON:  CT head without contrast 07/03/2017. FINDINGS: Brain: A large right-sided subdural hematoma extends over the convexity measuring  16 mm. There is a focal area of expansion over the high right parietal lobe. This creates significant mass effect with effacement of the sulci and much of the right lateral ventricle. 13 mm midline shift is evident. The atrium of the left lateral ventricle and temporal tip are mildly expanded. There is uncal herniation. Hemorrhage extends over the right temporal tip. No intraventricular hemorrhage is evident. No significant hemorrhage is present on the left. Basal ganglia are intact.  No cortical infarct is evident. Vascular: Vascular calcifications are present in the cavernous internal carotid arteries bilaterally. There is no hyperdense vessel. Skull: Calvarium is intact. No focal  lytic or blastic lesions are present. Sinuses/Orbits: Mild mucosal thickening present along the floor of the left maxillary sinus. Remaining paranasal sinuses and mastoid air cells are clear. Globes and orbits are within normal limits. ASPECTS [facility] Stroke Program Early CT Score) - Ganglionic level infarction (caudate, lentiform nuclei, internal capsule, insula, M1-M3 cortex): 7/7 - Supraganglionic infarction (M4-M6 cortex): 3/3 Total score (0-10 with 10 being normal): 10/10 IMPRESSION: 1. Large right subdural hematoma measuring up to 16 mm in width with significant mass effect and 13 mm of right-to-left midline shift. 2. Mass effect effaces the sulci and right lateral ventricle. 3. Uncal herniation is present. 4. Mild prominence of the atrium of the left lateral ventricle in left temporal tip may reflect early evidence of a trapped ventricle. 5. ASPECTS is 10/10 Critical Value/emergent results were called by telephone at the time of interpretation on 12/05/2017 at 3:57 pm to Dr. Rory Percy, who verbally acknowledged these results. Electronically Signed   By: San Morelle M.D.   On: 12/05/2017 16:01    ROS: Unable to to complete ROS due to patient being intubated.  Physical Exam: Vitals:   12/06/17 0800 12/06/17 0846 12/06/17 0900 12/06/17 1000  BP: 127/82  116/70 107/68  Pulse: 80  72 68  Resp: '20  20 20  ' Temp: 98.2 F (36.8 C)  98.1 F (36.7 C) 98.2 F (36.8 C)  TempSrc:      SpO2: 93% 100% 100% 100%  Weight:      Height:         General: NAD, intubated with family at bedside Head: dressing in place, +periorbital edema Neck: Supple. No JVD appreciated. Lungs: Intubated. CTA bilaterally. No wheeze, rales or rhonchi.  Heart: RRR. No murmur, rubs or gallops.  Abdomen: obese, soft, nontender, +BS, no guarding, no rebound tenderness Lower extremities: 3+ edema b/l LE, no ischemic changes or open wounds  Neuro: mildly sedated, easily awoken w/mild agitation while awake. Moves all  extremities. spontaneously. Dialysis Access: LU AVF, +b/t  Dialysis Orders:  TTS - [facility]  4hrs, BFR 400, DFR 800,  EDW 88.5kg, 2K/ 2.25Ca, Profile 4   Access: LU AVF  Heparin 7000 Unit bolus Venofer 57m IV qHD Mircera 1562m IV q2wks - last 2/14 Calcitriol 3.2531mPO qHD   Sensipar 81m28m Renvela 800mg84mC TID, 2 w/snacks  Last Labs: 2/14 Hgb 8.9, TSAT 26, K 6.0, Ca 9.3, P 12.3, Albumin 3.7, 1/15: PTH 329  Assessment/Plan: 1.  Large R SDH following mechanical fall- s/p craniotomy, now intubated. Possible extubation today? - per neurosurgery 2.  ESRD - Mildly elevated K (5.3), Veltassa ordered, to be given if patient extubated. Plan for HD tomorrow AM per regular schedule unless acute need indicated. 3.  Hypotension/volume  - Remains hypotensive. CXR this AM indicated no vascular congestion or pulmonary edema. Titrate down volume as tolerated.   4.  Acute BL Anemia  - Hgb 6.4, 2 units pRBC ordered, delayed due to antibodies and awaiting final cross match.  May receive with HD tomorrow if remains stable.  ESA just given 2/14. 5.  Secondary Hyperparathyroidism -  P 8.8, Cor Ca ok. Continue VDRA, sensipar, and binders once extubated.  6.  Nutrition - Renal diet once no longer NPO. Renavite. Nepro 7. Systolic/diastolic HF - holding coreg d/t hypotension. Per primary 8. DM - per primary 9. H/o DVT 10. Lymphedema - did not tolerate wraps.  Trying to get into [facility]. Per primary   Jen Mow, PA-C [facility] Pager: [phone] 12/06/2017, 10:31 AM

## 2017-12-06 NOTE — Progress Notes (Signed)
RN applied right wrist restraint. Family refusing to let RN restrain left wrist at this time. RN explained need for restraints to keep pt from pulling lines/tubes. Family still refusing to let RN apply left wrist restraint after explanation of risks of not having left arm restraint, specifically risk for self-extubation.

## 2017-12-06 NOTE — Progress Notes (Signed)
Pt due for blood, type and screen sent, notified by blood bank of antibody found and will be delayed in preparing blood.  WCTM

## 2017-12-06 NOTE — Progress Notes (Signed)
[place] Progress Note Patient Name: Albert Stanley DOB: Aug 17, 1946 MRN: [id number]   Date of Service  12/06/2017  HPI/Events of Note  Hypoglycemia - Blood glucose  = 21. Given D50.  eICU Interventions  Will order: 1. D10W to run IV at 30 mL/hour.      Intervention Category Major Interventions: Other:  [name] 12/06/2017, 11:43 PM

## 2017-12-06 NOTE — Progress Notes (Signed)
[place] Progress Note Patient Name: Albert Stanley DOB: October 12, 1946 MRN: [id number]   Date of Service  12/06/2017  HPI/Events of Note  Called d/t Hgb = 6.4. Patient is already ordered to be transfused 2 units PRBC, however, there is a delay d/t antibodies. Await final cross match results.   eICU Interventions  Continue present management.      Intervention Category Major Interventions: Other:  [name] 12/06/2017, 5:31 AM

## 2017-12-06 NOTE — Progress Notes (Signed)
Paged E link in regards to a low BP, backing off fent gtt, pt still opens eyes to voice and follows commands.  Also informed them of critical lab  CRITICAL VALUE ALERT  Critical Value:  Troponin 0.57  Date & Time Notied:  2/25 0125  Provider Notified: Oletta Darter  Orders Received/Actions taken: none at this time   I was made aware by the lab that they are unable to determine the antibody for the pts type and screen and this will need to be sent out. Informed E link that is blood is needed it will have to be an emergent release. They believe pt hgb (now 7.1) will suffice without emergent blood.  WCTM

## 2017-12-06 NOTE — Progress Notes (Signed)
[place] Progress Note Patient Name: Albert Stanley DOB: Jan 03, 1946 MRN: [id number]   Date of Service  12/06/2017  HPI/Events of Note  Multiple issues: 1. Hypotension - SBP = 104 - Last LVEF = 25% to 30% and 2. Agitation. Episode of bradycardia d/t Propofol IV infusion.   eICU Interventions  Will order: 1. Bolus with 0.9 NaCl 500 mL IV over 30 minutes now.  2. Phenylephrine IV infusion. Titrate to MAP > 65. 3. Continue Fentanyl IV infusion and IV PRN as needed.      Intervention Category Major Interventions: Hypotension - evaluation and management Minor Interventions: Agitation / anxiety - evaluation and management  Sommer,Steven Eugene 12/06/2017, 2:04 AM

## 2017-12-06 NOTE — Progress Notes (Signed)
Patient ID: Albert Stanley, male   DOB: [birth year], 72 y.o.   MRN: [id number] BP 124/77   Pulse 65   Temp 98.6 F (37 C)   Resp 20   Ht 5\' 9"  (1.753 m)   Wt 96.1 kg (211 lb 13.8 oz)   SpO2 100%   BMI 31.29 kg/m  [name] is alert, following commands, trying to speak. Moving all extremities, left less than right. Dressing with some sanguineous drainage Doing very well. Plan to extubate after dialysis tomorrow

## 2017-12-06 NOTE — Progress Notes (Signed)
Pt CBG 21, ordered D50 per standing order. Notified Dr. Oletta Darter, D10 IV fluid @ 30 ml/hr ordered.

## 2017-12-07 ENCOUNTER — Inpatient Hospital Stay (HOSPITAL_COMMUNITY): Payer: Medicare Other

## 2017-12-07 ENCOUNTER — Encounter (HOSPITAL_COMMUNITY): Payer: Self-pay | Admitting: Neurosurgery

## 2017-12-07 DIAGNOSIS — Z992 Dependence on renal dialysis: Secondary | ICD-10-CM

## 2017-12-07 DIAGNOSIS — Z7189 Other specified counseling: Secondary | ICD-10-CM

## 2017-12-07 DIAGNOSIS — Z452 Encounter for adjustment and management of vascular access device: Secondary | ICD-10-CM

## 2017-12-07 DIAGNOSIS — S065X9A Traumatic subdural hemorrhage with loss of consciousness of unspecified duration, initial encounter: Secondary | ICD-10-CM

## 2017-12-07 DIAGNOSIS — N189 Chronic kidney disease, unspecified: Secondary | ICD-10-CM

## 2017-12-07 DIAGNOSIS — S065XAA Traumatic subdural hemorrhage with loss of consciousness status unknown, initial encounter: Secondary | ICD-10-CM

## 2017-12-07 DIAGNOSIS — Z978 Presence of other specified devices: Secondary | ICD-10-CM

## 2017-12-07 DIAGNOSIS — Z9911 Dependence on respirator [ventilator] status: Secondary | ICD-10-CM

## 2017-12-07 LAB — CBC WITH DIFFERENTIAL/PLATELET
Basophils Absolute: 0 10*3/uL (ref 0.0–0.1)
Basophils Relative: 0 %
Eosinophils Absolute: 0 10*3/uL (ref 0.0–0.7)
Eosinophils Relative: 0 %
HCT: 20.9 % — ABNORMAL LOW (ref 39.0–52.0)
Hemoglobin: 7.2 g/dL — ABNORMAL LOW (ref 13.0–17.0)
Lymphocytes Relative: 9 %
Lymphs Abs: 1.2 10*3/uL (ref 0.7–4.0)
MCH: 30.9 pg (ref 26.0–34.0)
MCHC: 34.4 g/dL (ref 30.0–36.0)
MCV: 89.7 fL (ref 78.0–100.0)
Monocytes Absolute: 0.5 10*3/uL (ref 0.1–1.0)
Monocytes Relative: 4 %
Neutro Abs: 11.3 10*3/uL — ABNORMAL HIGH (ref 1.7–7.7)
Neutrophils Relative %: 87 %
Platelets: 135 10*3/uL — ABNORMAL LOW (ref 150–400)
RBC: 2.33 MIL/uL — ABNORMAL LOW (ref 4.22–5.81)
RDW: 16.1 % — ABNORMAL HIGH (ref 11.5–15.5)
WBC: 13 10*3/uL — ABNORMAL HIGH (ref 4.0–10.5)

## 2017-12-07 LAB — BLOOD GAS, ARTERIAL
Acid-base deficit: 5.6 mmol/L — ABNORMAL HIGH (ref 0.0–2.0)
Bicarbonate: 18 mmol/L — ABNORMAL LOW (ref 20.0–28.0)
Drawn by: 252031
FIO2: 40
MECHVT: 570 mL
O2 Saturation: 99.4 %
PEEP: 5 cmH2O
Patient temperature: 98.6
RATE: 20 resp/min
pCO2 arterial: 27.6 mmHg — ABNORMAL LOW (ref 32.0–48.0)
pH, Arterial: 7.429 (ref 7.350–7.450)
pO2, Arterial: 182 mmHg — ABNORMAL HIGH (ref 83.0–108.0)

## 2017-12-07 LAB — COMPREHENSIVE METABOLIC PANEL
ALT: 167 U/L — ABNORMAL HIGH (ref 17–63)
AST: 214 U/L — ABNORMAL HIGH (ref 15–41)
Albumin: 2.6 g/dL — ABNORMAL LOW (ref 3.5–5.0)
Alkaline Phosphatase: 51 U/L (ref 38–126)
Anion gap: 19 — ABNORMAL HIGH (ref 5–15)
BUN: 82 mg/dL — ABNORMAL HIGH (ref 6–20)
CO2: 17 mmol/L — ABNORMAL LOW (ref 22–32)
Calcium: 8 mg/dL — ABNORMAL LOW (ref 8.9–10.3)
Chloride: 102 mmol/L (ref 101–111)
Creatinine, Ser: 10.35 mg/dL — ABNORMAL HIGH (ref 0.61–1.24)
GFR calc Af Amer: 5 mL/min — ABNORMAL LOW (ref >60)
GFR calc non Af Amer: 4 mL/min — ABNORMAL LOW (ref >60)
Glucose, Bld: 113 mg/dL — ABNORMAL HIGH (ref 65–99)
Potassium: 6.2 mmol/L — ABNORMAL HIGH (ref 3.5–5.1)
Sodium: 138 mmol/L (ref 135–145)
Total Bilirubin: 0.9 mg/dL (ref 0.3–1.2)
Total Protein: 5.9 g/dL — ABNORMAL LOW (ref 6.5–8.1)

## 2017-12-07 LAB — CBC
HEMATOCRIT: 20.9 % — AB (ref 39.0–52.0)
Hemoglobin: 7.3 g/dL — ABNORMAL LOW (ref 13.0–17.0)
MCH: 30.9 pg (ref 26.0–34.0)
MCHC: 34.9 g/dL (ref 30.0–36.0)
MCV: 88.6 fL (ref 78.0–100.0)
Platelets: 113 10*3/uL — ABNORMAL LOW (ref 150–400)
RBC: 2.36 MIL/uL — ABNORMAL LOW (ref 4.22–5.81)
RDW: 17 % — AB (ref 11.5–15.5)
WBC: 11.7 10*3/uL — ABNORMAL HIGH (ref 4.0–10.5)

## 2017-12-07 LAB — BASIC METABOLIC PANEL
ANION GAP: 16 — AB (ref 5–15)
BUN: 67 mg/dL — AB (ref 6–20)
CO2: 20 mmol/L — ABNORMAL LOW (ref 22–32)
Calcium: 7.5 mg/dL — ABNORMAL LOW (ref 8.9–10.3)
Chloride: 103 mmol/L (ref 101–111)
Creatinine, Ser: 8.32 mg/dL — ABNORMAL HIGH (ref 0.61–1.24)
GFR calc Af Amer: 7 mL/min — ABNORMAL LOW (ref >60)
GFR, EST NON AFRICAN AMERICAN: 6 mL/min — AB (ref >60)
Glucose, Bld: 91 mg/dL (ref 65–99)
Potassium: 5.6 mmol/L — ABNORMAL HIGH (ref 3.5–5.1)
Sodium: 139 mmol/L (ref 135–145)

## 2017-12-07 LAB — GLUCOSE, CAPILLARY
GLUCOSE-CAPILLARY: 113 mg/dL — AB (ref 65–99)
GLUCOSE-CAPILLARY: 94 mg/dL (ref 65–99)
GLUCOSE-CAPILLARY: 56 mg/dL — AB (ref 65–99)
Glucose-Capillary: 142 mg/dL — ABNORMAL HIGH (ref 65–99)
Glucose-Capillary: 63 mg/dL — ABNORMAL LOW (ref 65–99)
Glucose-Capillary: 70 mg/dL (ref 65–99)
Glucose-Capillary: 76 mg/dL (ref 65–99)
Glucose-Capillary: 82 mg/dL (ref 65–99)
Glucose-Capillary: 86 mg/dL (ref 65–99)

## 2017-12-07 LAB — PROTIME-INR
INR: 1.59
Prothrombin Time: 18.8 seconds — ABNORMAL HIGH (ref 11.4–15.2)

## 2017-12-07 LAB — APTT: aPTT: 34 seconds (ref 24–36)

## 2017-12-07 LAB — MAGNESIUM: Magnesium: 2.5 mg/dL — ABNORMAL HIGH (ref 1.7–2.4)

## 2017-12-07 MED ORDER — SODIUM CHLORIDE 0.9% FLUSH: 10.0000 mL | INTRAVENOUS | Status: DC | PRN

## 2017-12-07 MED ORDER — CHLORHEXIDINE GLUCONATE CLOTH 2 % EX PADS
6.0000 | MEDICATED_PAD | Freq: Every day | CUTANEOUS | Status: DC
  Administered 2017-12-08 – 2017-12-15 (×8): 6 via TOPICAL

## 2017-12-07 MED ORDER — DEXTROSE 50 % IV SOLN
INTRAVENOUS | Status: AC
  Administered 2017-12-07: 50 mL
  Filled 2017-12-07: qty 50

## 2017-12-07 MED ORDER — SODIUM CHLORIDE 0.9% FLUSH
10.0000 mL | Freq: Two times a day (BID) | INTRAVENOUS | Status: DC
  Administered 2017-12-07 – 2017-12-08 (×3): 10 mL
  Administered 2017-12-09: 20 mL
  Administered 2017-12-09: 30 mL
  Administered 2017-12-10 – 2017-12-15 (×7): 10 mL

## 2017-12-07 MED ORDER — DEXMEDETOMIDINE HCL IN NACL 200 MCG/50ML IV SOLN
0.4000 ug/kg/h | INTRAVENOUS | Status: DC
  Administered 2017-12-07: 0.4 ug/kg/h via INTRAVENOUS
  Filled 2017-12-07: qty 100

## 2017-12-07 NOTE — Progress Notes (Signed)
Pt was staring hemodialysis treatment at 9:30. Pt on the [place] after 40 mins the system clotted. Charge   nurse Lenna Sciara came to verified that access clotted. Dr. Posey Pronto notified and order received as follow: Pt needs a catheter for HD.Report given to Renn. Raquel Sarna RN.

## 2017-12-07 NOTE — Progress Notes (Signed)
Patient ID: Albert Stanley, male   DOB: [birth year], 72 y.o.   MRN: [id number] BP 105/75   Pulse 70   Temp 98.2 F (36.8 C)   Resp 20   Ht 5\' 9"  (1.753 m)   Wt 96.5 kg (212 lb 11.9 oz)   SpO2 100%   BMI 31.42 kg/m  Alert, moving all extremities Right greater than left Wound is clean, dry, no signs of infection

## 2017-12-07 NOTE — Progress Notes (Signed)
Blood started at 0801 via right forearm peripheral IV. Approximately 5 minutes into administration, the catheter was inadvertently removed from the arm. MD notified, triple lumen CVC inserted. Blood was then restarted by HD team via LUE fistula at bedside.

## 2017-12-07 NOTE — Procedures (Signed)
Name:  [name] MRN:  [id number] DOB:  [birth year]  PROCEDURE NOTE  Procedure:  Central venous catheter placement.  Indications:  Need for intravenous access and hemodynamic monitoring.  Consent:  Consent was implied due to the emergency nature of the procedure.  Anesthesia:  A total of 10 mL of 1% Lidocaine was used for local infiltration anesthesia.  Procedure summary:  Appropriate equipment was assembled.  The patient was identified as Albert Stanley and safety timeout was performed. The patient was placed in Trendelenburg position.  Sterile technique was used. The patient's right anterior chest wall was prepped using chlorhexidine / alcohol scrub and the field was draped in usual sterile fashion with full body drape. After the adequate anesthesia was achieved, the right IJ vein was cannulated with the introducer needle without difficulty. A guide wire was advanced through the introducer needle, which was then withdrawn. A small skin incision was made at the point of wire entry, the dilator was inserted over the guide wire and appropriate dilation was obtained. The dilator was removed and a 20 cm triple-lumen catheter was advanced over the guide wire, which was then removed.  All ports were aspirated and flushed with normal saline without difficulty. The catheter was secured into place at 18 cm. Antibiotic patch was placed and sterile dressing was applied. Post-procedure chest x-ray was ordered.  Complications:  No immediate complications were noted.  Hemodynamic parameters and oxygenation remained stable throughout the procedure.  Estimated blood loss:  Less then 5 mL.  Etheleen Nicks, MD Pulmonary and [place] [facility]: [phone]  12/07/2017, 10:01 AM

## 2017-12-07 NOTE — Progress Notes (Signed)
[facility] Progress Note Patient Name: SAFWAN TOMEI DOB: 1945-12-27 MRN: [id number]   Date of Service  12/07/2017  HPI/Events of Note  Hyponatremia - Blood glucose = 63. Currently on D10W at 30 mL/hour.   eICU Interventions  Will order: 1. Increase D10W IV infusion rate to 50 mL/hour.      Intervention Category Major Interventions: Other:  Sommer,Steven Cornelia Copa 12/07/2017, 2:24 AM

## 2017-12-07 NOTE — Progress Notes (Signed)
Patient ID: Albert Stanley, male   DOB: 06-05-1946, 72 y.o.   MRN: [id number]  [facility] Progress Note   Assessment/ Plan:   1. Large R SDH following mechanical fall with no reported head trauma- status post craniotomy for evacuation of hematoma and remains intubated for ongoing respiratory failure/airway protection 2. ESRD - attempted medical management of hyperkalemia overnight with a rising potassium level noted again today. Hemodialysis attempted however had to be abandoned after 40 minutes as he is actively thrombosing his fistula. Unfortunately, with his recent subdural hematoma, not able to get systemic anticoagulation or efforts at thrombectomy. Will consult CCM with assistance to place hemodialysis catheter and re-attempt hemodialysis thereafter. 3. Hypotension: I reviewed his records from dialysis that showed intermittent episodes of hypotension that have been limiting to his ultrafiltration-this appears to be chronic. Arterial line unfortunately remains inaccurate.    4. Acute blood loss anemia on chronic anemia of ESRD: Status post PRBC transfusion earlier today via central line placed earlier today  5. Secondary Hyperparathyroidism -hyperphosphatemia noted on earlier labs-unfortunately with prior history of suboptimal adherence with renal diet/phosphorus binders.  6. Nutrition - currently nothing by mouth as he is intubated. 7. Systolic/diastolic HF -continue to hold antihypertensive therapy due to low blood pressures 8. H/o DVT 9. Lymphedema   Subjective:   Hemodialysis attempted this morning with difficulty cannulating the fistula and abandoned after 40 minutes after inability to sustain access flow/pressure.    Objective:   BP 132/83   Pulse 73   Temp 99.5 F (37.5 C)   Resp 20   Ht '5\' 9"'  (1.753 m)   Wt 96.5 kg (212 lb 11.9 oz)   SpO2 100%   BMI 31.42 kg/m   Physical Exam: Gen: Patient intubated, sedated CVS: Pulse regular rhythm, normal rate, S1  and S2 normal Resp: Anteriorly clear to auscultation, no rales/rhonchi  Abd: Soft, obese, nontender Ext: Chronic lymphedema of the lower extremities, 1+ left upper extremity edema. Left brachiocephalic fistula with pulsatile inflow but no palpable thrill over outflow. No audible bruit.   Labs: BMET Recent Labs  Lab 12/02/17 1115 12/04/17 1200 12/05/17 1613 12/05/17 1626 12/06/17 0256 12/07/17 0516  NA 138 136 137 138 136 138  K 6.6* 5.7* 5.2* 5.3* 5.3* 6.2*  CL 98* 95* 100* 97* 99* 102  CO2 19* 19*  --  19* 19* 17*  GLUCOSE 74 96 106* 105* 92 113*  BUN 96* 92* 61* 69* 70* 82*  CREATININE 12.99* 12.17* 9.80* 9.39* 9.37* 10.35*  CALCIUM 9.1 8.7*  --  8.6* 7.7* 8.0*  PHOS  --   --   --   --  8.8*  --    CBC Recent Labs  Lab 12/05/17 1626 12/05/17 1915 12/05/17 2349 12/06/17 0256 12/07/17 0516  WBC 8.9 5.4 6.6 4.6 13.0*  NEUTROABS 6.7  --   --   --  11.3*  HGB 9.6* 6.5* 7.1* 6.4* 7.2*  HCT 28.8* 19.1* 21.3* 18.4* 20.9*  MCV 92.9 93.2 91.8 91.5 89.7  PLT 126* 96* 120* 105* 135*   Medications:    . chlorhexidine gluconate (MEDLINE KIT)  15 mL Mouth Rinse BID  . insulin aspart  2-6 Units Subcutaneous Q4H  . mouth rinse  15 mL Mouth Rinse 10 times per day  . ondansetron (ZOFRAN) IV  4 mg Intravenous Once  . pantoprazole (PROTONIX) IV  40 mg Intravenous QHS   Elmarie Shiley, MD 12/07/2017, 11:10 AM

## 2017-12-07 NOTE — Progress Notes (Signed)
Patient returned to previous full support settings due to procedure. RT will continue to monitor.

## 2017-12-07 NOTE — Progress Notes (Addendum)
Over past 24hr pt has had 66ml of UO, pt has now had 72ml of dark bloody output with blood around the urethra as well. Paged [place]. Will bladder scan to determine mlapositioning of foley and potential urine in bladder and go from there.

## 2017-12-07 NOTE — Progress Notes (Signed)
PULMONARY / CRITICAL CARE MEDICINE   Name: Albert Stanley MRN: [id number] DOB: [birth year]    ADMISSION DATE:  12/05/2017 CONSULTATION DATE: December 05, 2017  REFERRING MD: Emergency room  CHIEF COMPLAINT: Fall with intracranial hemorrhage subdural hematoma  HISTORY OF PRESENT ILLNESS:   72 year old male with PMH of ESRD with HD T/R/Sat, GERD, CHF, DM, Remote Bladder Cancer (1990), H/O DVT (not on anticoagulation), HLD, HTN  Presents to ED on 2/23 after fall on 2/22. Denies hitting head. Woke up this morning dizzy and with unsteady gait. CT Head with large ride sided SDH with SAH component. Right Pupil 37mm and fixed. Neurosurgery consulted. Taken to OR for emergent craniotomy for hematoma evacuation. Remained intubated in post-operative states. PCCM consulted. Patient was severely hypoglycemic overnight he was started on D10 he also had need for central line that was done he also had need for blood transfusion that was done he also had need for a new HD catheter since his graft clotted because of most likely hypertension.  Were planning to place HD catheter   PAST MEDICAL HISTORY :  He  has a past medical history of Arthritis, Asthma, Bladder cancer ([place]) (dx'd 1990), CHF (congestive heart failure) ([place]), Diabetes mellitus without complication ([place]), ESRD (end stage renal disease) ([place]), GERD (gastroesophageal reflux disease), History of cardiac catheterization, History of DVT (deep vein thrombosis), History of nuclear stress test, Hyperlipidemia, Hypertension, and NICM (nonischemic cardiomyopathy) ([place]).  PAST SURGICAL HISTORY: He  has a past surgical history that includes Back surgery; Joint replacement (Left); arthroscopic knee surgery (Left); Cystoscopy; AV fistula placement (Left, 12/13/2015); Insertion of dialysis catheter (N/A, 12/13/2015); Cardiac catheterization (N/A, 02/28/2016); Cardiac catheterization (Left, 06/09/2016); Cardiac catheterization (Left, 06/09/2016); Cardiac  catheterization (Left, 06/25/2016); Fistula superficialization (Left, 08/14/2016); Ligation of competing branches of arteriovenous fistula (Left, 08/14/2016); Revison of arteriovenous fistula (Left, 05/07/2017); Insertion of dialysis catheter (Left, 05/07/2017); and IR Radiologist Eval & Mgmt (05/12/2017).  Allergies  Allergen Reactions  . Meperidine Shortness Of Breath and Other (See Comments)    Increased heart rate Reaction to demerol  . Sulfa Antibiotics Shortness Of Breath and Other (See Comments)    Increased heart rate  . Sulfonamide Derivatives Shortness Of Breath and Other (See Comments)    Increased heart rate  . Eggs Or Egg-Derived Products Nausea And Vomiting     Reaction to egg yolks - not whites    . Foltx [Elfolate Plus] Other (See Comments)    Pt does not recall reaction to this  . Lac Bovis Diarrhea, Nausea And Vomiting and Other (See Comments)    Constipation Reaction to whole milk  . Lactose Intolerance (Gi) Diarrhea, Nausea And Vomiting and Other (See Comments)    Constipation Reaction to whole milk  . Milk-Related Compounds Diarrhea, Nausea And Vomiting and Other (See Comments)    Constipation Reaction to whole milk  . Penicillins Other (See Comments)    "Knocks me out."   Has patient had a PCN reaction causing immediate rash, facial/tongue/throat swelling, SOB or lightheadedness with hypotension: yes- blacked out Has patient had a PCN reaction causing severe rash involving mucus membranes or skin necrosis: unknown Has patient had a PCN reaction that required hospitalization: at MD office Has patient had a PCN reaction occurring within the last 10 years: no If all of the above answers are "NO", then may proceed with Cephalosporin use.   . Betadine [Povidone Iodine] Itching, Rash and Other (See Comments)    burning    No current facility-administered medications  on file prior to encounter.    Current Outpatient Medications on File Prior to Encounter  Medication  Sig  . acetaminophen (TYLENOL) 500 MG tablet Take 1,000 mg by mouth every 6 (six) hours as needed for mild pain.  Marland Kitchen albuterol (PROVENTIL HFA;VENTOLIN HFA) 108 (90 Base) MCG/ACT inhaler Inhale 1-2 puffs into the lungs every 6 (six) hours as needed for wheezing or shortness of breath.  . allopurinol (ZYLOPRIM) 300 MG tablet Take 300 mg by mouth daily.   Marland Kitchen amiodarone (PACERONE) 200 MG tablet Take 1 tablet (200 mg total) by mouth daily.  . calcitRIOL (ROCALTROL) 0.5 MCG capsule Take 0.5 mcg by mouth Every Tuesday,Thursday,and Saturday with dialysis. Medication given at dialysis on Tues Thurs Sat  . diphenhydrAMINE (BENADRYL) 25 MG tablet Take 25 mg by mouth 2 (two) times daily as needed for itching or allergies.   Marland Kitchen glipiZIDE (GLUCOTROL) 5 MG tablet Take 0.5 tablets (2.5 mg total) by mouth 2 (two) times daily. (Patient taking differently: Take 2.5 mg by mouth See admin instructions. Take as needed...one tablet in the morning if BS is greater than 80.)  . Menthol, Topical Analgesic, (ICY HOT EX) Apply 1 application topically 3 (three) times daily as needed (pain).  . multivitamin (RENA-VIT) TABS tablet Take 1 tablet by mouth at bedtime.   Marland Kitchen oxyCODONE-acetaminophen (PERCOCET) 7.5-325 MG tablet Take 1 tablet by mouth 3 (three) times daily as needed (pain). (Patient taking differently: Take 1 tablet by mouth 4 (four) times daily as needed (pain). )  . sevelamer carbonate (RENVELA) 800 MG tablet Take 2 tablets (1,600 mg total) by mouth 3 (three) times daily with meals. (Patient taking differently: Take 2,400 mg by mouth See admin instructions. Take 3 tablets (2400 mg) by mouth daily with meals - usually twice daily)  . carvedilol (COREG) 3.125 MG tablet Take 1 tablet (3.125 mg total) by mouth See admin instructions. Take 1 tablet (3.125 mg) by mouth twice daily on Monday, Wednesday, Friday and Sunday (non-dialysis days) (Patient not taking: Reported on 12/04/2017)  . famotidine (PEPCID) 20 MG tablet Take 1 tablet  (20 mg total) by mouth 2 (two) times daily as needed (sour stomach). (Patient not taking: Reported on 12/04/2017)    FAMILY HISTORY:  His indicated that his mother is deceased. He indicated that his father is deceased. He indicated that the status of his sister is unknown. He indicated that the status of his brother is unknown. He indicated that the status of his daughter is unknown. He indicated that the status of his paternal uncle is unknown.   SOCIAL HISTORY: He  reports that he quit smoking about 19 years ago. He quit after 30.00 years of use. he has never used smokeless tobacco. He reports that he does not drink alcohol or use drugs.  REVIEW OF SYSTEMS:   Unable to obtain due to patient's conditions  SUBJECTIVE:  Patient is intubated and agitated.  VITAL SIGNS: BP 123/70   Pulse 63   Temp 98.8 F (37.1 C)   Resp 20   Ht 5\' 9"  (1.753 m)   Wt 212 lb 11.9 oz (96.5 kg)   SpO2 100%   BMI 31.42 kg/m   HEMODYNAMICS:    VENTILATOR SETTINGS: Vent Mode: PRVC FiO2 (%):  [40 %] 40 % Set Rate:  [20 bmp] 20 bmp Vt Set:  [570 mL] 570 mL PEEP:  [5 cmH20] 5 cmH20 Pressure Support:  [10 cmH20] 10 cmH20 Plateau Pressure:  [16 cmH20-18 cmH20] 18 cmH20  INTAKE / OUTPUT:  I/O last 3 completed shifts: In: 1274.7 [I.V.:1244.7; Blood:30] Out: 10 [Urine:10]  PHYSICAL EXAMINATION: General:  Adult male on vent Neuro:  Sedated, pupils intact  HEENT:  Scleral edema, post-op dressing in place  Cardiovascular:  Loletha Grayer, no MRG  Lungs:  Clear breath sounds, no wheeze  Abdomen:  Obese, active bowel sounds  Musculoskeletal:  +2 BLE  Skin:  Warm, dry      LABS:  BMET Recent Labs  Lab 12/05/17 1626 12/06/17 0256 12/07/17 0516  NA 138 136 138  K 5.3* 5.3* 6.2*  CL 97* 99* 102  CO2 19* 19* 17*  BUN 69* 70* 82*  CREATININE 9.39* 9.37* 10.35*  GLUCOSE 105* 92 113*    Electrolytes Recent Labs  Lab 12/05/17 1626 12/06/17 0256 12/07/17 0516  CALCIUM 8.6* 7.7* 8.0*  MG  --  2.4  2.5*  PHOS  --  8.8*  --     CBC Recent Labs  Lab 12/05/17 2349 12/06/17 0256 12/07/17 0516  WBC 6.6 4.6 13.0*  HGB 7.1* 6.4* 7.2*  HCT 21.3* 18.4* 20.9*  PLT 120* 105* 135*    Coag's Recent Labs  Lab 12/05/17 1626 12/07/17 0516  APTT 32 34  INR 1.38 1.59    Sepsis Markers No results for input(s): LATICACIDVEN, PROCALCITON, O2SATVEN in the last 168 hours.  ABG Recent Labs  Lab 12/05/17 1613 12/05/17 2015 12/07/17 0300  PHART 7.337* 7.470* 7.429  PCO2ART 47.2 29.6* 27.6*  PO2ART 322.0* 148* 182*    Liver Enzymes Recent Labs  Lab 12/05/17 1626 12/07/17 0516  AST 60* 214*  ALT 33 167*  ALKPHOS 79 51  BILITOT 0.7 0.9  ALBUMIN 3.2* 2.6*    Cardiac Enzymes Recent Labs  Lab 12/05/17 2349  TROPONINI 0.57*    Glucose Recent Labs  Lab 12/06/17 1933 12/06/17 2337 12/07/17 0006 12/07/17 0211 12/07/17 0247 12/07/17 0754  GLUCAP 79 21* 70 63* 113* 76    Imaging Dg Chest Port 1 View  Result Date: 12/07/2017 CLINICAL DATA:  Status post right internal jugular venous catheter placement. EXAM: PORTABLE CHEST 1 VIEW COMPARISON:  Portable chest x-ray of earlier today. FINDINGS: The internal jugular venous catheter on the right has its tip projecting over the junction of the middle and distal thirds of the SVC. There is no postprocedure complication. The lungs are mildly hypoinflated. There is no focal infiltrate. The cardiac silhouette is enlarged. The pulmonary vascularity is not engorged. There is calcification in the wall of the aortic arch. The endotracheal tube tip projects 2.6 cm above the carina. IMPRESSION: No postprocedure complication following right internal jugular venous catheter placement. Electronically Signed   By: David  Martinique M.D.   On: 12/07/2017 09:40   Dg Chest Port 1 View  Result Date: 12/07/2017 CLINICAL DATA:  Intubated EXAM: PORTABLE CHEST 1 VIEW COMPARISON:  Chest radiograph from one day prior. FINDINGS: Endotracheal tube tip is 2.6  cm above the carina. Stable cardiomediastinal silhouette with mild cardiomegaly. No pneumothorax. No pleural effusion. Curvilinear bibasilar lung opacities are mildly increased. No pulmonary edema. IMPRESSION: 1. Well-positioned endotracheal tube. 2. Stable cardiomegaly without pulmonary edema. 3. Mildly increased curvilinear bibasilar lung opacities, favor atelectasis. Electronically Signed   By: Ilona Sorrel M.D.   On: 12/07/2017 08:48     STUDIES:  CT Head 2/24 > 1. Large right subdural hematoma measuring up to 16 mm in width with significant mass effect and 13 mm of right-to-left midline shift. 2. Mass effect effaces the sulci and right lateral ventricle. 3. Uncal  herniation is present. 4. Mild prominence of the atrium of the left lateral ventricle in left temporal tip may reflect early evidence of a trapped ventricle. CXR 2/24 > 1. Endotracheal tube is well positioned. 2. Well-positioned nasal/orogastric tube curling in the mid esophagus. Tip projects above the included field of view, within the neck. 3. Stable cardiomegaly. No acute findings in the lungs.     CULTURES: None.   ANTIBIOTICS: None.   SIGNIFICANT EVENTS: 2/23 > Presents to ED  2/24 > Taken to OR   LINES/TUBES: ETT 2/24 >>  DISCUSSION: 72 year old male with mechanical fall on 2/22 presents to ED on 2/23 with weakness and headache. CT Head with large right SDH with 13 mm right to left midline shift. Taken to OR for craniotomy and evacuation of hematoma  Date December 07, 2017 patient was severely hypoglycemic he was started on D10 most likely due to stress and inability to eat chronic renal insufficiency he also had need for emergent central line that was done dialysis was attempted but not able to be done due to clot in his AV fistula due to clot most likely due to hypotension were planning to do HD catheter he was anemic he was transfused.  ASSESSMENT / PLAN:  PULMONARY A: Respiratory Insufficieny s/p  SDH  P:   Vent Support Trend ABG/CXR Pulmonary Hygiene Patient has intermittent agitation he is due for dialysis either today or tomorrow he is due for blood transfusion 2 units of blood given his drop in his hemoglobin at this point he is not ready for extubation he has intermittent weakness and waxing and waning of mental mental status.  We will continue with vent support since the patient has not been able to be dialyzed get blood transfusion hypoglycemic need for procedures  CARDIOVASCULAR A:  Systolic/Diastolic HF (EF 44-96 on 04/5915)  H/O HTN, HLD, DVT   P:  Cardiac Monitoring  Maintain Systolic <384 and >66  Hold Coreg  RENAL A:   ESRD with HD T/R/S Hyperkalemia  Chronic Foley  P:   Trend BMP > repeat at midnight  Replace electrolytes as indicated  Nephrology following > HD on 2/23  KVO Fluids  Unable to dialyze due to clot in the AV fistula planning catheter HD catheter.  GASTROINTESTINAL A:   SUP H/O GERD  P:   N.p.o. might need to start him tube feed since I think his course will be much more prolonged and with the family is anticipating. NPO PPI   HEMATOLOGIC A:   Acute Blood loss anemia  P:  Trend CBC  Transfuse for Hemoglobin <7 Transfuse 2 units now  Patient has drop in his hemoglobin most likely because of the hematoma and bleeding before during and after the OR time he will need to be transfused these autoantibodies at this point waiting for the blood to arrive if it becomes an emergency release blood emergently to transfuse him.  INFECTIOUS A:   No issues  P:   Trend WBC and Fever Curve  Change foley out   ENDOCRINE A:   DM    P:   Trend Glucose   Severe hypoglycemia unknown etiology most likely because of the glipizide is still in his system will hold that and start D10 trend glucose.  NEUROLOGIC A:   SDH with mass effect s/p Craniotomy S/P Mechanical Fall   P:   RASS goal: 0/-1 Per Neurosurgery     FAMILY  - Updates:  Family updated at bedside   -  Inter-disciplinary family meet or Palliative Care meeting due by: 12/12/2017   Critical care time is  50 minutes for this patient.  Pulmonary and [place] Pager: [phone]  12/07/2017, 11:55 AM

## 2017-12-08 ENCOUNTER — Inpatient Hospital Stay (HOSPITAL_COMMUNITY): Payer: Medicare Other

## 2017-12-08 DIAGNOSIS — R7401 Elevation of levels of liver transaminase levels: Secondary | ICD-10-CM

## 2017-12-08 DIAGNOSIS — R740 Nonspecific elevation of levels of transaminase and lactic acid dehydrogenase [LDH]: Secondary | ICD-10-CM

## 2017-12-08 LAB — CBC WITH DIFFERENTIAL/PLATELET
BASOS ABS: 0 10*3/uL (ref 0.0–0.1)
BLASTS: 0 %
Band Neutrophils: 1 %
Basophils Relative: 0 %
Eosinophils Absolute: 0.2 10*3/uL (ref 0.0–0.7)
Eosinophils Relative: 2 %
HEMATOCRIT: 20.1 % — AB (ref 39.0–52.0)
Hemoglobin: 7 g/dL — ABNORMAL LOW (ref 13.0–17.0)
LYMPHS PCT: 6 %
Lymphs Abs: 0.6 10*3/uL — ABNORMAL LOW (ref 0.7–4.0)
MCH: 31 pg (ref 26.0–34.0)
MCHC: 34.8 g/dL (ref 30.0–36.0)
MCV: 88.9 fL (ref 78.0–100.0)
METAMYELOCYTES PCT: 0 %
MONOS PCT: 8 %
Monocytes Absolute: 0.8 10*3/uL (ref 0.1–1.0)
Myelocytes: 0 %
NEUTROS ABS: 8.5 10*3/uL — AB (ref 1.7–7.7)
NEUTROS PCT: 83 %
NRBC: 1 /100{WBCs} — AB
Other: 0 %
Platelets: 93 10*3/uL — ABNORMAL LOW (ref 150–400)
Promyelocytes Absolute: 0 %
RBC: 2.26 MIL/uL — AB (ref 4.22–5.81)
RDW: 17.4 % — ABNORMAL HIGH (ref 11.5–15.5)
WBC: 10.1 10*3/uL (ref 4.0–10.5)

## 2017-12-08 LAB — COMPREHENSIVE METABOLIC PANEL
ALT: 587 U/L — AB (ref 17–63)
AST: 615 U/L — AB (ref 15–41)
Albumin: 2.4 g/dL — ABNORMAL LOW (ref 3.5–5.0)
Alkaline Phosphatase: 49 U/L (ref 38–126)
Anion gap: 15 (ref 5–15)
BILIRUBIN TOTAL: 1.2 mg/dL (ref 0.3–1.2)
BUN: 45 mg/dL — AB (ref 6–20)
CO2: 21 mmol/L — ABNORMAL LOW (ref 22–32)
CREATININE: 6.87 mg/dL — AB (ref 0.61–1.24)
Calcium: 7.5 mg/dL — ABNORMAL LOW (ref 8.9–10.3)
Chloride: 97 mmol/L — ABNORMAL LOW (ref 101–111)
GFR calc Af Amer: 8 mL/min — ABNORMAL LOW (ref >60)
GFR, EST NON AFRICAN AMERICAN: 7 mL/min — AB (ref >60)
Glucose, Bld: 170 mg/dL — ABNORMAL HIGH (ref 65–99)
POTASSIUM: 5.1 mmol/L (ref 3.5–5.1)
Sodium: 133 mmol/L — ABNORMAL LOW (ref 135–145)
TOTAL PROTEIN: 4.9 g/dL — AB (ref 6.5–8.1)

## 2017-12-08 LAB — BLOOD GAS, ARTERIAL
Acid-Base Excess: 0.6 mmol/L (ref 0.0–2.0)
Bicarbonate: 23.8 mmol/L (ref 20.0–28.0)
DRAWN BY: 41977
FIO2: 35
MECHVT: 570 mL
O2 Saturation: 94.2 %
PEEP/CPAP: 5 cmH2O
Patient temperature: 98.6
RATE: 20 resp/min
pCO2 arterial: 33 mmHg (ref 32.0–48.0)
pH, Arterial: 7.473 — ABNORMAL HIGH (ref 7.350–7.450)
pO2, Arterial: 71.9 mmHg — ABNORMAL LOW (ref 83.0–108.0)

## 2017-12-08 LAB — PROTIME-INR
INR: 1.69
Prothrombin Time: 19.7 seconds — ABNORMAL HIGH (ref 11.4–15.2)

## 2017-12-08 LAB — APTT: APTT: 35 s (ref 24–36)

## 2017-12-08 LAB — GLUCOSE, CAPILLARY
GLUCOSE-CAPILLARY: 91 mg/dL (ref 65–99)
GLUCOSE-CAPILLARY: 119 mg/dL — AB (ref 65–99)
Glucose-Capillary: 150 mg/dL — ABNORMAL HIGH (ref 65–99)
Glucose-Capillary: 105 mg/dL — ABNORMAL HIGH (ref 65–99)
Glucose-Capillary: 125 mg/dL — ABNORMAL HIGH (ref 65–99)

## 2017-12-08 LAB — MAGNESIUM: Magnesium: 2.1 mg/dL (ref 1.7–2.4)

## 2017-12-08 MED ORDER — ORAL CARE MOUTH RINSE
15.0000 mL | Freq: Two times a day (BID) | OROMUCOSAL | Status: DC
Start: 2017-12-08 — End: 2017-12-16
  Administered 2017-12-08 – 2017-12-14 (×12): 15 mL via OROMUCOSAL

## 2017-12-08 MED ORDER — NEPRO/CARBSTEADY PO LIQD
1000.0000 mL | ORAL | Status: DC
  Administered 2017-12-08 – 2017-12-14 (×6): 1000 mL
  Filled 2017-12-08 (×10): qty 1000

## 2017-12-08 MED ORDER — PRO-STAT SUGAR FREE PO LIQD
30.0000 mL | Freq: Two times a day (BID) | ORAL | Status: DC
  Administered 2017-12-08 – 2017-12-14 (×12): 30 mL
  Filled 2017-12-08 (×14): qty 30

## 2017-12-08 MED ORDER — MUPIROCIN 2 % EX OINT
TOPICAL_OINTMENT | Freq: Two times a day (BID) | CUTANEOUS | Status: DC
  Administered 2017-12-08: 16:00:00 via NASAL
  Administered 2017-12-08: 1 via NASAL
  Administered 2017-12-09 (×2): via NASAL
  Administered 2017-12-10: 1 via NASAL
  Administered 2017-12-11 – 2017-12-16 (×9): via NASAL
  Filled 2017-12-08 (×2): qty 22

## 2017-12-08 NOTE — Progress Notes (Addendum)
Patient ID: Albert Stanley, male   DOB: 01-27-1946, 72 y.o.   MRN: [id number]  [facility] Progress Note   Assessment/ Plan:   1. Large R SDH following mechanical fall with no reported head trauma- status post craniotomy for evacuation of hematoma and remains intubated for ongoing respiratory failure/airway protection. Surgical wound healing well.  2. ESRD - Underwent HD via temp HD catheter after thrombosis of LUA AVF. Continue TTS HD schedule with dialysis again tomorrow. Potassium borderline high.  3. Hypotension: Chronic and likely limiting to UF for aggressive management of lymphedema. This probably contributed to AVF thrombosis.  4. Acute blood loss anemia on chronic anemia of ESRD: Status post PRBC transfusion earlier yesterday via central line with rise of Hgb to 7.0.  5. Secondary Hyperparathyroidism -hyperphosphatemia noted on earlier labs-unfortunately with prior history of suboptimal adherence with renal diet/phosphorus binders. Restart binders after extubated/when eating.  6. Nutrition - currently nothing by mouth as he is intubated. 7. Systolic/diastolic HF -continue to hold antihypertensive therapy due to low blood pressures 8. H/o DVT 9. Lymphedema   Subjective:   No acute events overnight, had HD after placement of temporary HD catheter.    Objective:   BP 108/73   Pulse 75   Temp 97.7 F (36.5 C)   Resp (!) 8   Ht '5\' 9"'  (1.753 m)   Wt 96.7 kg (213 lb 3 oz)   SpO2 100%   BMI 31.48 kg/m   Physical Exam: Gen: Patient intubated, sedated, healing surgical scar over scalp CVS: Pulse regular rhythm, normal rate, S1 and S2 normal Resp: Anteriorly clear to auscultation, no rales/rhonchi, Left IJ temporary HD catheter Abd: Soft, obese, nontender Ext: Chronic lymphedema of the lower extremities, 1+ left upper extremity edema. Left brachiocephalic fistula thrombosed   Labs: BMET Recent Labs  Lab 12/02/17 1115 12/04/17 1200 12/05/17 1613  12/05/17 1626 12/06/17 0256 12/07/17 0516 12/07/17 1729 12/08/17 0334  NA 138 136 137 138 136 138 139 133*  K 6.6* 5.7* 5.2* 5.3* 5.3* 6.2* 5.6* 5.1  CL 98* 95* 100* 97* 99* 102 103 97*  CO2 19* 19*  --  19* 19* 17* 20* 21*  GLUCOSE 74 96 106* 105* 92 113* 91 170*  BUN 96* 92* 61* 69* 70* 82* 67* 45*  CREATININE 12.99* 12.17* 9.80* 9.39* 9.37* 10.35* 8.32* 6.87*  CALCIUM 9.1 8.7*  --  8.6* 7.7* 8.0* 7.5* 7.5*  PHOS  --   --   --   --  8.8*  --   --   --    CBC Recent Labs  Lab 12/05/17 1626  12/06/17 0256 12/07/17 0516 12/07/17 1729 12/08/17 0334  WBC 8.9   < > 4.6 13.0* 11.7* 10.1  NEUTROABS 6.7  --   --  11.3*  --  8.5*  HGB 9.6*   < > 6.4* 7.2* 7.3* 7.0*  HCT 28.8*   < > 18.4* 20.9* 20.9* 20.1*  MCV 92.9   < > 91.5 89.7 88.6 88.9  PLT 126*   < > 105* 135* 113* 93*   < > = values in this interval not displayed.   Medications:    . chlorhexidine gluconate (MEDLINE KIT)  15 mL Mouth Rinse BID  . Chlorhexidine Gluconate Cloth  6 each Topical Q2200  . insulin aspart  2-6 Units Subcutaneous Q4H  . mouth rinse  15 mL Mouth Rinse 10 times per day  . ondansetron (ZOFRAN) IV  4 mg Intravenous Once  . pantoprazole (PROTONIX)  IV  40 mg Intravenous QHS  . sodium chloride flush  10-40 mL Intracatheter Q12H   Elmarie Shiley, MD 12/08/2017, 9:17 AM

## 2017-12-08 NOTE — Progress Notes (Signed)
50 mLs of Versed (unopened bag) wasted in sink, witnessed with Tomma Rakers, RN

## 2017-12-08 NOTE — Progress Notes (Signed)
Cortrak Tube Team Note:  Consult received to place a Cortrak feeding tube.   A 10 F Cortrak tube was placed in the right nare and secured with a nasal bridle at 83 cm. Per the Cortrak monitor reading the tube tip is post  pyloric.   No x-ray is required. RN may begin using tube.   If the tube becomes dislodged please keep the tube and contact the Cortrak team at [email or web address] ([email or web address]) for replacement.  If after hours and replacement cannot be delayed, place a NG tube and confirm placement with an abdominal x-ray.    Mariana Single RD, LDN Clinical Nutrition Pager # [phone]

## 2017-12-08 NOTE — Progress Notes (Signed)
Initial Nutrition Assessment  DOCUMENTATION CODES:   Obesity unspecified  INTERVENTION:   Nepro @ 45 ml/hr (1080 ml/day) via Cortrak 30 ml Prostat BID  Provides: 2144 kcal, 117 grams protein, and 788 ml free water.    NUTRITION DIAGNOSIS:   Increased nutrient needs related to (ESRD on HD) as evidenced by estimated needs.  GOAL:   Patient will meet greater than or equal to 90% of their needs  MONITOR:   TF tolerance, Labs, I & O's  REASON FOR ASSESSMENT:   Consult Enteral/tube feeding initiation and management  ASSESSMENT:   Pt with PMH of ESRD on HD with T/R/Sa, remote bladder ca s/p chronic foley changed every month, GERD, CHF, DM, HLD, HTN admitted 2/23 after a fall on 2/22 with large R SDH with SAH s/p emergent craniotomy 2/24.    Pt discussed during ICU rounds and with RN.  Pt severely hypoglycemic overnight and started on D10  AV fistula clot, HD via L IJ HD catheter  Per wife pt with good appetite until 2 weeks ago. During this time he has had vomiting and poor appetite which she relates to new hyperkalemia.  Usually eats 3 meals per day. Breakfast: toast with peanut butter and jam, snack: nabs, Lunch sandwich (also takes this to HD), snack: nabs, Dinner: meat, starch, veggie. Does drink some soda and water.   He has been on HD since 2017. He has lost a lot of weight related to fluid since starting this per wife. He developed lymphedema which was being treated but not really getting better per wife. His mobility was decreased mostly using a wheelchair except going upstairs to use the bathroom. He uses a crutch to go upstairs and fell after coming after the bathroom.  88.5 kg dry body weight - 194 lb   Medications reviewed and include: novolog 2-6 units every 4 hrs, zofran Labs reviewed: Na 133 (L), BUN/Cr 45/6.87 (H) CBG's: 150-91 UOP: 55 ml  Pt is positive 4 L  NUTRITION - FOCUSED PHYSICAL EXAM: Most of exam unreliable due to edema. Noted depletion at  temples and clavicle/acromion bone and deltoid region which are not impacted by edema.     Most Recent Value  Orbital Region  No depletion  Upper Arm Region  No depletion [edema]  Thoracic and Lumbar Region  No depletion  Buccal Region  No depletion  Temple Region  Mild depletion  Clavicle Bone Region  No depletion  Clavicle and Acromion Bone Region  Moderate depletion  Scapular Bone Region  Unable to assess  Dorsal Hand  No depletion  Patellar Region  No depletion  Anterior Thigh Region  No depletion  Posterior Calf Region  No depletion  Edema (RD Assessment)  Severe  Hair  Reviewed  Eyes  Reviewed  Mouth  Unable to assess  Skin  Reviewed  Nails  Reviewed       Diet Order:  Diet NPO time specified  EDUCATION NEEDS:   Not appropriate for education at this time  Skin:  Skin Assessment: (head incision)  Last BM:  unknown  Height:   Ht Readings from Last 1 Encounters:  12/05/17 5\' 9"  (1.753 m)    Weight:   Wt Readings from Last 1 Encounters:  12/08/17 213 lb 3 oz (96.7 kg)    Ideal Body Weight:  72.7 kg  BMI:  Body mass index is 31.48 kg/m.  Estimated Nutritional Needs:   Kcal:  2100-2300  Protein:  110-125 grams  Fluid:  1.2 L/day  [place], [place], [place] Pager [phone] After Hours Pager

## 2017-12-08 NOTE — Progress Notes (Signed)
PULMONARY / CRITICAL CARE MEDICINE   Name: Albert Stanley MRN: [id number] DOB: [birth year]    ADMISSION DATE:  12/05/2017 CONSULTATION DATE: December 05, 2017  REFERRING MD: Emergency room  CHIEF COMPLAINT: Fall with intracranial hemorrhage subdural hematoma  HISTORY OF PRESENT ILLNESS:   72 year old male with PMH of ESRD with HD T/R/Sat, GERD, CHF, DM, Remote Bladder Cancer (1990), H/O DVT (not on anticoagulation), HLD, HTN  Presents to ED on 2/23 after fall on 2/22. Denies hitting head. Woke up this morning dizzy and with unsteady gait. CT Head with large ride sided SDH with SAH component. Right Pupil 45mm and fixed. Neurosurgery consulted. Taken to OR for emergent craniotomy for hematoma evacuation. Remained intubated in post-operative states. PCCM consulted. Patient was severely hypoglycemic overnight he was started on D10 he also had need for central line that was done he also had need for blood transfusion that was done he also had need for a new HD catheter placed yesterday for AV fistula clot most likely because of hypotension today patient is weaning he has transaminitis and the blood work.  He had full HD dialysis session yesterday.   PAST MEDICAL HISTORY :  He  has a past medical history of Arthritis, Asthma, Bladder cancer ([place]) (dx'd 1990), CHF (congestive heart failure) ([place]), Diabetes mellitus without complication ([employer]), ESRD (end stage renal disease) ([place]), GERD (gastroesophageal reflux disease), History of cardiac catheterization, History of DVT (deep vein thrombosis), History of nuclear stress test, Hyperlipidemia, Hypertension, and NICM (nonischemic cardiomyopathy) ([place]).  PAST SURGICAL HISTORY: He  has a past surgical history that includes Back surgery; Joint replacement (Left); arthroscopic knee surgery (Left); Cystoscopy; AV fistula placement (Left, 12/13/2015); Insertion of dialysis catheter (N/A, 12/13/2015); Cardiac catheterization (N/A, 02/28/2016); Cardiac  catheterization (Left, 06/09/2016); Cardiac catheterization (Left, 06/09/2016); Cardiac catheterization (Left, 06/25/2016); Fistula superficialization (Left, 08/14/2016); Ligation of competing branches of arteriovenous fistula (Left, 08/14/2016); Revison of arteriovenous fistula (Left, 05/07/2017); Insertion of dialysis catheter (Left, 05/07/2017); IR Radiologist Eval & Mgmt (05/12/2017); and Craniotomy (N/A, 12/05/2017).  Allergies  Allergen Reactions  . Meperidine Shortness Of Breath and Other (See Comments)    Increased heart rate Reaction to demerol  . Sulfa Antibiotics Shortness Of Breath and Other (See Comments)    Increased heart rate  . Sulfonamide Derivatives Shortness Of Breath and Other (See Comments)    Increased heart rate  . Eggs Or Egg-Derived Products Nausea And Vomiting     Reaction to egg yolks - not whites    . Foltx [Elfolate Plus] Other (See Comments)    Pt does not recall reaction to this  . Lac Bovis Diarrhea, Nausea And Vomiting and Other (See Comments)    Constipation Reaction to whole milk  . Lactose Intolerance (Gi) Diarrhea, Nausea And Vomiting and Other (See Comments)    Constipation Reaction to whole milk  . Milk-Related Compounds Diarrhea, Nausea And Vomiting and Other (See Comments)    Constipation Reaction to whole milk  . Penicillins Other (See Comments)    "Knocks me out."   Has patient had a PCN reaction causing immediate rash, facial/tongue/throat swelling, SOB or lightheadedness with hypotension: yes- blacked out Has patient had a PCN reaction causing severe rash involving mucus membranes or skin necrosis: unknown Has patient had a PCN reaction that required hospitalization: at MD office Has patient had a PCN reaction occurring within the last 10 years: no If all of the above answers are "NO", then may proceed with Cephalosporin use.   . Betadine [Povidone Iodine]  Itching, Rash and Other (See Comments)    burning    No current facility-administered  medications on file prior to encounter.    Current Outpatient Medications on File Prior to Encounter  Medication Sig  . acetaminophen (TYLENOL) 500 MG tablet Take 1,000 mg by mouth every 6 (six) hours as needed for mild pain.  Marland Kitchen albuterol (PROVENTIL HFA;VENTOLIN HFA) 108 (90 Base) MCG/ACT inhaler Inhale 1-2 puffs into the lungs every 6 (six) hours as needed for wheezing or shortness of breath.  . allopurinol (ZYLOPRIM) 300 MG tablet Take 300 mg by mouth daily.   Marland Kitchen amiodarone (PACERONE) 200 MG tablet Take 1 tablet (200 mg total) by mouth daily.  . calcitRIOL (ROCALTROL) 0.5 MCG capsule Take 0.5 mcg by mouth Every Tuesday,Thursday,and Saturday with dialysis. Medication given at dialysis on Tues Thurs Sat  . diphenhydrAMINE (BENADRYL) 25 MG tablet Take 25 mg by mouth 2 (two) times daily as needed for itching or allergies.   Marland Kitchen glipiZIDE (GLUCOTROL) 5 MG tablet Take 0.5 tablets (2.5 mg total) by mouth 2 (two) times daily. (Patient taking differently: Take 2.5 mg by mouth See admin instructions. Take as needed...one tablet in the morning if BS is greater than 80.)  . Menthol, Topical Analgesic, (ICY HOT EX) Apply 1 application topically 3 (three) times daily as needed (pain).  . multivitamin (RENA-VIT) TABS tablet Take 1 tablet by mouth at bedtime.   Marland Kitchen oxyCODONE-acetaminophen (PERCOCET) 7.5-325 MG tablet Take 1 tablet by mouth 3 (three) times daily as needed (pain). (Patient taking differently: Take 1 tablet by mouth 4 (four) times daily as needed (pain). )  . sevelamer carbonate (RENVELA) 800 MG tablet Take 2 tablets (1,600 mg total) by mouth 3 (three) times daily with meals. (Patient taking differently: Take 2,400 mg by mouth See admin instructions. Take 3 tablets (2400 mg) by mouth daily with meals - usually twice daily)  . carvedilol (COREG) 3.125 MG tablet Take 1 tablet (3.125 mg total) by mouth See admin instructions. Take 1 tablet (3.125 mg) by mouth twice daily on Monday, Wednesday, Friday and  Sunday (non-dialysis days) (Patient not taking: Reported on 12/04/2017)  . famotidine (PEPCID) 20 MG tablet Take 1 tablet (20 mg total) by mouth 2 (two) times daily as needed (sour stomach). (Patient not taking: Reported on 12/04/2017)    FAMILY HISTORY:  His indicated that his mother is deceased. He indicated that his father is deceased. He indicated that the status of his sister is unknown. He indicated that the status of his brother is unknown. He indicated that the status of his daughter is unknown. He indicated that the status of his paternal uncle is unknown.   SOCIAL HISTORY: He  reports that he quit smoking about 19 years ago. He quit after 30.00 years of use. he has never used smokeless tobacco. He reports that he does not drink alcohol or use drugs.  REVIEW OF SYSTEMS:   Unable to obtain due to patient's conditions  SUBJECTIVE:  Patient is intubated and agitated.  VITAL SIGNS: BP 139/87   Pulse 90   Temp 97.9 F (36.6 C)   Resp (!) 8   Ht 5\' 9"  (1.753 m)   Wt 213 lb 3 oz (96.7 kg)   SpO2 100%   BMI 31.48 kg/m   HEMODYNAMICS:    VENTILATOR SETTINGS: Vent Mode: CPAP;PSV FiO2 (%):  [35 %-40 %] 40 % Set Rate:  [20 bmp] 20 bmp Vt Set:  [570 mL] 570 mL PEEP:  [5 cmH20] 5 cmH20  Pressure Support:  [5 cmH20] 5 cmH20 Plateau Pressure:  [18 cmH20-23 cmH20] 23 cmH20  INTAKE / OUTPUT: I/O last 3 completed shifts: In: 5075.7 [I.V.:4658.2; Blood:417.5] Out: 2565 [Urine:65; Other:2500]  PHYSICAL EXAMINATION: General:  Adult male on vent Neuro:  Sedated, pupils intact  HEENT:  Scleral edema, post-op dressing in place  Cardiovascular:  Loletha Grayer, no MRG  Lungs:  Clear breath sounds, no wheeze  Abdomen:  Obese, active bowel sounds  Musculoskeletal:  +2 BLE  Skin:  Warm, dry      LABS:  BMET Recent Labs  Lab 12/07/17 0516 12/07/17 1729 12/08/17 0334  NA 138 139 133*  K 6.2* 5.6* 5.1  CL 102 103 97*  CO2 17* 20* 21*  BUN 82* 67* 45*  CREATININE 10.35* 8.32*  6.87*  GLUCOSE 113* 91 170*    Electrolytes Recent Labs  Lab 12/06/17 0256 12/07/17 0516 12/07/17 1729 12/08/17 0334  CALCIUM 7.7* 8.0* 7.5* 7.5*  MG 2.4 2.5*  --  2.1  PHOS 8.8*  --   --   --     CBC Recent Labs  Lab 12/07/17 0516 12/07/17 1729 12/08/17 0334  WBC 13.0* 11.7* 10.1  HGB 7.2* 7.3* 7.0*  HCT 20.9* 20.9* 20.1*  PLT 135* 113* 93*    Coag's Recent Labs  Lab 12/05/17 1626 12/07/17 0516 12/08/17 0334  APTT 32 34 35  INR 1.38 1.59 1.69    Sepsis Markers No results for input(s): LATICACIDVEN, PROCALCITON, O2SATVEN in the last 168 hours.  ABG Recent Labs  Lab 12/05/17 2015 12/07/17 0300 12/08/17 0320  PHART 7.470* 7.429 7.473*  PCO2ART 29.6* 27.6* 33.0  PO2ART 148* 182* 71.9*    Liver Enzymes Recent Labs  Lab 12/05/17 1626 12/07/17 0516 12/08/17 0334  AST 60* 214* 615*  ALT 33 167* 587*  ALKPHOS 79 51 49  BILITOT 0.7 0.9 1.2  ALBUMIN 3.2* 2.6* 2.4*    Cardiac Enzymes Recent Labs  Lab 12/05/17 2349  TROPONINI 0.57*    Glucose Recent Labs  Lab 12/07/17 1620 12/07/17 2021 12/07/17 2324 12/07/17 2327 12/08/17 0339 12/08/17 0732  GLUCAP 82 94 56* 142* 150* 91    Imaging Dg Chest Port 1 View  Result Date: 12/07/2017 CLINICAL DATA:  Status post placement of a new dialysis catheter. EXAM: PORTABLE CHEST 1 VIEW COMPARISON:  Portable chest x-ray of December 07, 2017 at 9:09 a.m. FINDINGS: The patient has undergone placement of a dialysis catheter via the left subclavian approach. The tip of the catheter projects over the proximal SVC. No postprocedure pneumothorax is observed. The right internal jugular venous catheter is in stable position with its tip approximately 3 cm more distal in the mid SVC. The endotracheal tube tip projects 3.8 cm above the carina. The cardiac silhouette remains enlarged. The pulmonary vascularity is not engorged. There is no pleural effusion. There is calcification in the wall of the thoracic aorta.  IMPRESSION: No postprocedure complication following placement of the dialysis catheter via the left subclavian approach. Electronically Signed   By: David  Martinique M.D.   On: 12/07/2017 13:23     STUDIES:  CT Head 2/24 > 1. Large right subdural hematoma measuring up to 16 mm in width with significant mass effect and 13 mm of right-to-left midline shift. 2. Mass effect effaces the sulci and right lateral ventricle. 3. Uncal herniation is present. 4. Mild prominence of the atrium of the left lateral ventricle in left temporal tip may reflect early evidence of a trapped ventricle. CXR 2/24 > 1.  Endotracheal tube is well positioned. 2. Well-positioned nasal/orogastric tube curling in the mid esophagus. Tip projects above the included field of view, within the neck. 3. Stable cardiomegaly. No acute findings in the lungs.     CULTURES: None.   ANTIBIOTICS: None.   SIGNIFICANT EVENTS: 2/23 > Presents to ED  2/24 > Taken to OR   LINES/TUBES: ETT 2/24 >> Right IJ central line December 07, 2017. Left HD catheter IJ December 07, 2017 Chronic Foley that is changed every month as an outpatient because of bladder spasms  DISCUSSION: 72 year old male with mechanical fall on 2/22 presents to ED on 2/23 with weakness and headache. CT Head with large right SDH with 13 mm right to left midline shift. Taken to OR for craniotomy and evacuation of hematoma  Date December 07, 2017 patient was severely hypoglycemic he was started on D10 most likely due to stress and inability to eat chronic renal insufficiency he also had need for emergent central line that was done dialysis was attempted but not able to be done due to clot in his AV fistula due to clot most likely due to hypotension were planning to do HD catheter he was anemic he was transfused.  ASSESSMENT / PLAN:  PULMONARY A: Respiratory Insufficieny s/p SDH  P:   Vent Support Trend ABG/CXR Pulmonary Hygiene Patient is weak on the  left side he is able to wean we will see how he does and may be extubate him if he passes spontaneous breathing trial he has been agitated on and off he is off sedation since the morning.  CARDIOVASCULAR A:  Systolic/Diastolic HF (EF 57-84 on 03/9628)  H/O HTN, HLD, DVT   P:  Cardiac Monitoring  Maintain Systolic <528 and >41  Hold Coreg  RENAL A:   ESRD with HD T/R/S Hyperkalemia  Chronic Foley  P:   Trend BMP > repeat at midnight  Replace electrolytes as indicated  Nephrology following [place]  Patient had left IJ HD catheter he was dialyzed yesterday nephrology following.  GASTROINTESTINAL A:   SUP Transaminitis H/O GERD  P:   N.p.o. might need to start him tube feed since I think his course will be much more prolonged and with the family is anticipating. NPO PPI  Transaminitis is most likely due to the hypotension hypoperfusion of the liver we will continue to trend is not on a statin at this point we will continue to monitor if it continues to get worse we will do a complete workup but I suspect with improvement in his blood pressure above the transaminitis will continue to improve.   HEMATOLOGIC A:   Acute Blood loss anemia  P:  Trend CBC  Transfuse for Hemoglobin <7 Transfuse 2 units now  Patient has drop in his hemoglobin most likely because of the hematoma and bleeding before during and after the OR time he will need to be transfused these autoantibodies at this point waiting for the blood to arrive if it becomes an emergency release blood emergently to transfuse him.  INFECTIOUS A:   No issues  P:   Trend WBC and Fever Curve  Change foley out   ENDOCRINE A:   DM    P:   Trend Glucose   Severe hypoglycemia unknown etiology most likely because of the glipizide is still in his system will hold that and start D10 trend glucose.  Patient to continue with his defect and to keep his sugar above the hypoglycemia range this is  most likely due to the  glipizide.  NEUROLOGIC A:   SDH with mass effect s/p Craniotomy S/P Mechanical Fall   P:   RASS goal: 0/-1 Per Neurosurgery     FAMILY  - Updates: Family updated at bedside   - Inter-disciplinary family meet or Palliative Care meeting due by: 12/12/2017   Critical care time is  50 minutes for this patient.  Pulmonary and [place] Pager: [phone]  12/08/2017, 10:05 AM

## 2017-12-08 NOTE — Progress Notes (Signed)
Patient ID: Albert Stanley, male   DOB: May 20, 1946, 72 y.o.   MRN: [id number] BP (!) 131/102   Pulse (!) 101   Temp 98.6 F (37 C)   Resp (!) 21   Ht 5\' 9"  (1.753 m)   Wt 96.7 kg (213 lb 3 oz)   SpO2 100%   BMI 31.48 kg/m ' Lethargic , follows commands Plegic on left side Extubated successfully Will start pt and ot

## 2017-12-09 ENCOUNTER — Inpatient Hospital Stay (HOSPITAL_COMMUNITY): Payer: Medicare Other

## 2017-12-09 LAB — CBC WITH DIFFERENTIAL/PLATELET
BASOS ABS: 0 10*3/uL (ref 0.0–0.1)
BASOS PCT: 0 %
Eosinophils Absolute: 0 10*3/uL (ref 0.0–0.7)
Eosinophils Relative: 0 %
HEMATOCRIT: 22.2 % — AB (ref 39.0–52.0)
Hemoglobin: 7.4 g/dL — ABNORMAL LOW (ref 13.0–17.0)
LYMPHS PCT: 7 %
Lymphs Abs: 1 10*3/uL (ref 0.7–4.0)
MCH: 29.7 pg (ref 26.0–34.0)
MCHC: 33.3 g/dL (ref 30.0–36.0)
MCV: 89.2 fL (ref 78.0–100.0)
MONOS PCT: 7 %
Monocytes Absolute: 1 10*3/uL (ref 0.1–1.0)
NEUTROS ABS: 11.6 10*3/uL — AB (ref 1.7–7.7)
NEUTROS PCT: 86 %
Platelets: 146 10*3/uL — ABNORMAL LOW (ref 150–400)
RBC: 2.49 MIL/uL — ABNORMAL LOW (ref 4.22–5.81)
RDW: 16.8 % — ABNORMAL HIGH (ref 11.5–15.5)
WBC: 13.6 10*3/uL — ABNORMAL HIGH (ref 4.0–10.5)

## 2017-12-09 LAB — BPAM RBC
BLOOD PRODUCT EXPIRATION DATE: 201904052359
Blood Product Expiration Date: 201904052359
Blood Product Expiration Date: 201904072359
Blood Product Expiration Date: 201904072359
ISSUE DATE / TIME: 201902260422
ISSUE DATE / TIME: 201902260744
UNIT TYPE AND RH: 9500
Unit Type and Rh: 9500
Unit Type and Rh: 9500
Unit Type and Rh: 9500

## 2017-12-09 LAB — COMPREHENSIVE METABOLIC PANEL
ALBUMIN: 2.5 g/dL — AB (ref 3.5–5.0)
ALK PHOS: 88 U/L (ref 38–126)
ALT: 504 U/L — AB (ref 17–63)
ANION GAP: 16 — AB (ref 5–15)
AST: 292 U/L — ABNORMAL HIGH (ref 15–41)
BILIRUBIN TOTAL: 1.2 mg/dL (ref 0.3–1.2)
BUN: 61 mg/dL — AB (ref 6–20)
CALCIUM: 7.7 mg/dL — AB (ref 8.9–10.3)
CO2: 21 mmol/L — AB (ref 22–32)
CREATININE: 8.23 mg/dL — AB (ref 0.61–1.24)
Chloride: 95 mmol/L — ABNORMAL LOW (ref 101–111)
GFR calc Af Amer: 7 mL/min — ABNORMAL LOW (ref >60)
GFR calc non Af Amer: 6 mL/min — ABNORMAL LOW (ref >60)
GLUCOSE: 165 mg/dL — AB (ref 65–99)
Potassium: 5.9 mmol/L — ABNORMAL HIGH (ref 3.5–5.1)
SODIUM: 132 mmol/L — AB (ref 135–145)
TOTAL PROTEIN: 5.3 g/dL — AB (ref 6.5–8.1)

## 2017-12-09 LAB — TYPE AND SCREEN
ABO/RH(D): O POS
ANTIBODY SCREEN: POSITIVE
DAT, IgG: POSITIVE
UNIT DIVISION: 0
UNIT DIVISION: 0
UNIT DIVISION: 0
Unit division: 0

## 2017-12-09 LAB — PROTIME-INR
INR: 1.72
Prothrombin Time: 20 seconds — ABNORMAL HIGH (ref 11.4–15.2)

## 2017-12-09 LAB — MAGNESIUM: Magnesium: 2.3 mg/dL (ref 1.7–2.4)

## 2017-12-09 LAB — POCT I-STAT 3, ART BLOOD GAS (G3+)
Acid-base deficit: 2 mmol/L (ref 0.0–2.0)
BICARBONATE: 22.5 mmol/L (ref 20.0–28.0)
O2 Saturation: 97 %
PCO2 ART: 38.5 mmHg (ref 32.0–48.0)
PO2 ART: 91 mmHg (ref 83.0–108.0)
TCO2: 24 mmol/L (ref 22–32)
pH, Arterial: 7.377 (ref 7.350–7.450)

## 2017-12-09 LAB — GLUCOSE, CAPILLARY
GLUCOSE-CAPILLARY: 97 mg/dL (ref 65–99)
Glucose-Capillary: 106 mg/dL — ABNORMAL HIGH (ref 65–99)
Glucose-Capillary: 125 mg/dL — ABNORMAL HIGH (ref 65–99)
Glucose-Capillary: 171 mg/dL — ABNORMAL HIGH (ref 65–99)
Glucose-Capillary: 84 mg/dL (ref 65–99)
Glucose-Capillary: 96 mg/dL (ref 65–99)

## 2017-12-09 LAB — APTT: APTT: 37 s — AB (ref 24–36)

## 2017-12-09 MED ORDER — ALTEPLASE 2 MG IJ SOLR
2.0000 mg | Freq: Once | INTRAMUSCULAR | Status: DC | PRN
  Filled 2017-12-09: qty 2

## 2017-12-09 MED ORDER — SODIUM CHLORIDE 0.9 % IV SOLN: 100.0000 mL | INTRAVENOUS | Status: DC | PRN

## 2017-12-09 MED ORDER — OXYBUTYNIN CHLORIDE 5 MG/5ML PO SYRP
5.0000 mg | ORAL_SOLUTION | Freq: Two times a day (BID) | ORAL | Status: DC
  Administered 2017-12-09 – 2017-12-16 (×12): 5 mg via ORAL
  Filled 2017-12-09 (×16): qty 5

## 2017-12-09 MED ORDER — POLYETHYLENE GLYCOL 3350 17 G PO PACK
17.0000 g | PACK | Freq: Every day | ORAL | Status: DC
  Administered 2017-12-09 – 2017-12-16 (×6): 17 g via ORAL
  Filled 2017-12-09 (×6): qty 1

## 2017-12-09 MED ORDER — HEPARIN SODIUM (PORCINE) 1000 UNIT/ML DIALYSIS
1000.0000 [IU] | INTRAMUSCULAR | Status: DC | PRN
  Filled 2017-12-09: qty 1

## 2017-12-09 MED ORDER — ACETAMINOPHEN 325 MG PO TABS
650.0000 mg | ORAL_TABLET | Freq: Four times a day (QID) | ORAL | Status: DC | PRN
  Administered 2017-12-10: 650 mg via ORAL
  Filled 2017-12-09 (×2): qty 2

## 2017-12-09 MED ORDER — MUSCLE RUB 10-15 % EX CREA
TOPICAL_CREAM | CUTANEOUS | Status: DC | PRN
  Filled 2017-12-09: qty 85

## 2017-12-09 MED ORDER — LIDOCAINE HCL (PF) 1 % IJ SOLN: 5.0000 mL | INTRAMUSCULAR | Status: DC | PRN

## 2017-12-09 MED ORDER — PENTAFLUOROPROP-TETRAFLUOROETH EX AERO: 1.0000 "application " | INHALATION_SPRAY | CUTANEOUS | Status: DC | PRN

## 2017-12-09 MED ORDER — LIDOCAINE-PRILOCAINE 2.5-2.5 % EX CREA
1.0000 "application " | TOPICAL_CREAM | CUTANEOUS | Status: DC | PRN
  Filled 2017-12-09: qty 5

## 2017-12-09 MED ORDER — OXYCODONE-ACETAMINOPHEN 7.5-325 MG PO TABS
1.0000 | ORAL_TABLET | Freq: Four times a day (QID) | ORAL | Status: DC | PRN
  Administered 2017-12-09 – 2017-12-16 (×18): 1 via ORAL
  Filled 2017-12-09 (×19): qty 1

## 2017-12-09 NOTE — Progress Notes (Signed)
Patient ID: Albert Stanley, male   DOB: Jun 24, 1946, 72 y.o.   MRN: [id number]  [facility] Progress Note   Assessment/ Plan:   1. Large R SDH following mechanical fall with no reported head trauma- status post craniotomy for evacuation of hematoma and extubated overnight and currently with some lethargy that is suspected to be from preceding analgesic use/sedation. Left-sided residual hemiplegia noted  2. ESRD - continue hemodialysis on a Tuesday/Thursday/Saturday schedule via temporary left IJ hemodialysis catheter-we'll consult vascular surgery for tunnel dialysis catheter and new access planning when he is more stable from the current events.  3. Labile blood pressures: With episodes of intermittent hypotension that have limited ultrafiltration for management of his lymphedema and likely have contributed to his recent left brachiocephalic fistula thrombosis.  4. Acute blood loss anemia on chronic anemia of ESRD: Improved hemoglobin level status post PRBC transfusion, continue to monitor for additional needs.  5. Secondary Hyperparathyroidism -hyperphosphatemic with previous pattern of suboptimal adherence to renal diet/binders-resume phosphorus binders when able to eat renal diet.  6. Nutrition - currently nothing by mouth as his mental status poses a risk to aspiration. 7. Systolic/diastolic HF -continue to hold antihypertensive therapy due to low blood pressures 8. H/o DVT 9. Lymphedema   Subjective:   Reports to be having a sensation that he needs to urinate and just can't (Foley catheter in situ).    Objective:   BP (!) 140/105   Pulse 84   Temp 98.4 F (36.9 C)   Resp 18   Ht 5\' 9"  (1.753 m)   Wt 97.5 kg (214 lb 15.2 oz)   SpO2 100%   BMI 31.74 kg/m   Physical Exam: Gen: Patient extubated, awake and intermittently appears agitated/and comfortable-somewhat somnolent CVS: Pulse regular rhythm, normal rate, S1 and S2 normal Resp: Anteriorly clear to  auscultation, no rales/rhonchi, Left IJ temporary HD catheter Abd: Soft, obese, nontender Ext: Chronic lymphedema of the lower extremities, 1+ left upper extremity edema. Left brachiocephalic fistula thrombosed   Labs: BMET Recent Labs  Lab 12/04/17 1200 12/05/17 1613 12/05/17 1626 12/06/17 0256 12/07/17 0516 12/07/17 1729 12/08/17 0334 12/09/17 0430  NA 136 137 138 136 138 139 133* 132*  K 5.7* 5.2* 5.3* 5.3* 6.2* 5.6* 5.1 5.9*  CL 95* 100* 97* 99* 102 103 97* 95*  CO2 19*  --  19* 19* 17* 20* 21* 21*  GLUCOSE 96 106* 105* 92 113* 91 170* 165*  BUN 92* 61* 69* 70* 82* 67* 45* 61*  CREATININE 12.17* 9.80* 9.39* 9.37* 10.35* 8.32* 6.87* 8.23*  CALCIUM 8.7*  --  8.6* 7.7* 8.0* 7.5* 7.5* 7.7*  PHOS  --   --   --  8.8*  --   --   --   --    CBC Recent Labs  Lab 12/05/17 1626  12/07/17 0516 12/07/17 1729 12/08/17 0334 12/09/17 0430  WBC 8.9   < > 13.0* 11.7* 10.1 13.6*  NEUTROABS 6.7  --  11.3*  --  8.5* 11.6*  HGB 9.6*   < > 7.2* 7.3* 7.0* 7.4*  HCT 28.8*   < > 20.9* 20.9* 20.1* 22.2*  MCV 92.9   < > 89.7 88.6 88.9 89.2  PLT 126*   < > 135* 113* 93* 146*   < > = values in this interval not displayed.   Medications:    . Chlorhexidine Gluconate Cloth  6 each Topical Q2200  . feeding supplement (NEPRO CARB STEADY)  1,000 mL Per Tube Q24H  .  feeding supplement (PRO-STAT SUGAR FREE 64)  30 mL Per Tube BID  . insulin aspart  2-6 Units Subcutaneous Q4H  . mouth rinse  15 mL Mouth Rinse BID  . mupirocin ointment   Nasal BID  . ondansetron (ZOFRAN) IV  4 mg Intravenous Once  . oxybutynin  5 mg Oral BID  . pantoprazole (PROTONIX) IV  40 mg Intravenous QHS  . polyethylene glycol  17 g Oral Daily  . sodium chloride flush  10-40 mL Intracatheter Q12H   Elmarie Shiley, MD 12/09/2017, 10:15 AM

## 2017-12-09 NOTE — Progress Notes (Signed)
Patient ID: Albert Stanley, male   DOB: [birth year], 72 y.o.   MRN: [id number] BP 138/84   Pulse 96   Temp 98.2 F (36.8 C) (Axillary)   Resp 18   Ht 5\' 9"  (1.753 m)   Wt 94.5 kg (208 lb 5.4 oz)   SpO2 100%   BMI 30.77 kg/m  Alert, following all commands Speech is clear, and fluent Moving all extremities, right greater than left, but left side movement is improved Wound is clean,dry, and without signs of infection Much improved

## 2017-12-09 NOTE — Progress Notes (Signed)
PULMONARY / CRITICAL CARE MEDICINE   Name: Albert Stanley MRN: [id number] DOB: November 26, 1945    ADMISSION DATE:  12/05/2017 CONSULTATION DATE: December 05, 2017  REFERRING MD: Emergency room  CHIEF COMPLAINT: Fall with intracranial hemorrhage subdural hematoma  HISTORY OF PRESENT ILLNESS:   72 year old male with PMH of ESRD with HD T/R/Sat, GERD, CHF, DM, Remote Bladder Cancer (1990), H/O DVT (not on anticoagulation), HLD, HTN  Presents to ED on 2/23 after fall on 2/22. Denies hitting head. Woke up this morning dizzy and with unsteady gait. CT Head with large ride sided SDH with SAH component. Right Pupil 73mm and fixed. Neurosurgery consulted. Taken to OR for emergent craniotomy for hematoma evacuation. Remained intubated in post-operative states. PCCM consulted. Patient was severely hypoglycemic overnight he was started on D10 he also had need for central line that was done he also had need for blood transfusion that was done he also had need for a new HD catheter placed yesterday for AV fistula clot most likely because of hypotension today patient is weaning he has transaminitis and the blood work.  He had full HD dialysis session today. Patient had NG tube catheter placed he is under treating his of the D10 water and he is not hypoglycemic anymore we are holding his glipizide and insulin.  PAST MEDICAL HISTORY :  He  has a past medical history of Arthritis, Asthma, Bladder cancer ([place]) (dx'd 1990), CHF (congestive heart failure) ([place]), Diabetes mellitus without complication ([place]), ESRD (end stage renal disease) ([place]), GERD (gastroesophageal reflux disease), History of cardiac catheterization, History of DVT (deep vein thrombosis), History of nuclear stress test, Hyperlipidemia, Hypertension, and NICM (nonischemic cardiomyopathy) ([place]).  PAST SURGICAL HISTORY: He  has a past surgical history that includes Back surgery; Joint replacement (Left); arthroscopic knee surgery (Left); Cystoscopy;  AV fistula placement (Left, 12/13/2015); Insertion of dialysis catheter (N/A, 12/13/2015); Cardiac catheterization (N/A, 02/28/2016); Cardiac catheterization (Left, 06/09/2016); Cardiac catheterization (Left, 06/09/2016); Cardiac catheterization (Left, 06/25/2016); Fistula superficialization (Left, 08/14/2016); Ligation of competing branches of arteriovenous fistula (Left, 08/14/2016); Revison of arteriovenous fistula (Left, 05/07/2017); Insertion of dialysis catheter (Left, 05/07/2017); IR Radiologist Eval & Mgmt (05/12/2017); and Craniotomy (N/A, 12/05/2017).  Allergies  Allergen Reactions  . Meperidine Shortness Of Breath and Other (See Comments)    Increased heart rate Reaction to demerol  . Sulfa Antibiotics Shortness Of Breath and Other (See Comments)    Increased heart rate  . Sulfonamide Derivatives Shortness Of Breath and Other (See Comments)    Increased heart rate  . Eggs Or Egg-Derived Products Nausea And Vomiting     Reaction to egg yolks - not whites    . Foltx [Elfolate Plus] Other (See Comments)    Pt does not recall reaction to this  . [place] Diarrhea, Nausea And Vomiting and Other (See Comments)    Constipation Reaction to whole milk  . Lactose Intolerance (Gi) Diarrhea, Nausea And Vomiting and Other (See Comments)    Constipation Reaction to whole milk  . Milk-Related Compounds Diarrhea, Nausea And Vomiting and Other (See Comments)    Constipation Reaction to whole milk  . Penicillins Other (See Comments)    "Knocks me out."   Has patient had a PCN reaction causing immediate rash, facial/tongue/throat swelling, SOB or lightheadedness with hypotension: yes- blacked out Has patient had a PCN reaction causing severe rash involving mucus membranes or skin necrosis: unknown Has patient had a PCN reaction that required hospitalization: at MD office Has patient had a PCN reaction  occurring within the last 10 years: no If all of the above answers are "NO", then may proceed with  Cephalosporin use.   . Betadine [Povidone Iodine] Itching, Rash and Other (See Comments)    burning    No current facility-administered medications on file prior to encounter.    Current Outpatient Medications on File Prior to Encounter  Medication Sig  . acetaminophen (TYLENOL) 500 MG tablet Take 1,000 mg by mouth every 6 (six) hours as needed for mild pain.  Marland Kitchen albuterol (PROVENTIL HFA;VENTOLIN HFA) 108 (90 Base) MCG/ACT inhaler Inhale 1-2 puffs into the lungs every 6 (six) hours as needed for wheezing or shortness of breath.  . allopurinol (ZYLOPRIM) 300 MG tablet Take 300 mg by mouth daily.   Marland Kitchen amiodarone (PACERONE) 200 MG tablet Take 1 tablet (200 mg total) by mouth daily.  . calcitRIOL (ROCALTROL) 0.5 MCG capsule Take 0.5 mcg by mouth Every Tuesday,Thursday,and Saturday with dialysis. Medication given at dialysis on Tues Thurs Sat  . diphenhydrAMINE (BENADRYL) 25 MG tablet Take 25 mg by mouth 2 (two) times daily as needed for itching or allergies.   Marland Kitchen glipiZIDE (GLUCOTROL) 5 MG tablet Take 0.5 tablets (2.5 mg total) by mouth 2 (two) times daily. (Patient taking differently: Take 2.5 mg by mouth See admin instructions. Take as needed...one tablet in the morning if BS is greater than 80.)  . Menthol, Topical Analgesic, (ICY HOT EX) Apply 1 application topically 3 (three) times daily as needed (pain).  . multivitamin (RENA-VIT) TABS tablet Take 1 tablet by mouth at bedtime.   Marland Kitchen oxyCODONE-acetaminophen (PERCOCET) 7.5-325 MG tablet Take 1 tablet by mouth 3 (three) times daily as needed (pain). (Patient taking differently: Take 1 tablet by mouth 4 (four) times daily as needed (pain). )  . sevelamer carbonate (RENVELA) 800 MG tablet Take 2 tablets (1,600 mg total) by mouth 3 (three) times daily with meals. (Patient taking differently: Take 2,400 mg by mouth See admin instructions. Take 3 tablets (2400 mg) by mouth daily with meals - usually twice daily)  . carvedilol (COREG) 3.125 MG tablet Take 1  tablet (3.125 mg total) by mouth See admin instructions. Take 1 tablet (3.125 mg) by mouth twice daily on Monday, Wednesday, Friday and Sunday (non-dialysis days) (Patient not taking: Reported on 12/04/2017)  . famotidine (PEPCID) 20 MG tablet Take 1 tablet (20 mg total) by mouth 2 (two) times daily as needed (sour stomach). (Patient not taking: Reported on 12/04/2017)    FAMILY HISTORY:  His indicated that his mother is deceased. He indicated that his father is deceased. He indicated that the status of his sister is unknown. He indicated that the status of his brother is unknown. He indicated that the status of his daughter is unknown. He indicated that the status of his paternal uncle is unknown.   SOCIAL HISTORY: He  reports that he quit smoking about 19 years ago. He quit after 30.00 years of use. he has never used smokeless tobacco. He reports that he does not drink alcohol or use drugs.  REVIEW OF SYSTEMS:   Unable to obtain due to patient's conditions  SUBJECTIVE:  Patient is intubated and agitated.  VITAL SIGNS: BP (!) 140/105   Pulse 100   Temp 98.6 F (37 C)   Resp 18   Ht 5\' 9"  (1.753 m)   Wt 214 lb 15.2 oz (97.5 kg)   SpO2 99%   BMI 31.74 kg/m   HEMODYNAMICS:    VENTILATOR SETTINGS: Vent Mode: CPAP;PSV FiO2 (%):  [  32 %-40 %] 32 % PEEP:  [5 cmH20] 5 cmH20 Pressure Support:  [5 cmH20] 5 cmH20  INTAKE / OUTPUT: I/O last 3 completed shifts: In: 4422.6 [I.V.:3727.4; NG/GT:695.3] Out: 2550 [Urine:50; Other:2500]  PHYSICAL EXAMINATION: General:  Adult male on extubated Neuro:  Sedated, pupils intact  HEENT:  Scleral edema, post-op dressing in place  Cardiovascular:  Loletha Grayer, no MRG  Lungs:  Clear breath sounds, no wheeze  Abdomen:  Obese, active bowel sounds  Musculoskeletal:  2/5 over the left side 4/5 in the right side. Skin:  Warm, dry      LABS:  BMET Recent Labs  Lab 12/07/17 1729 12/08/17 0334 12/09/17 0430  NA 139 133* 132*  K 5.6* 5.1 5.9*   CL 103 97* 95*  CO2 20* 21* 21*  BUN 67* 45* 61*  CREATININE 8.32* 6.87* 8.23*  GLUCOSE 91 170* 165*    Electrolytes Recent Labs  Lab 12/06/17 0256 12/07/17 0516 12/07/17 1729 12/08/17 0334 12/09/17 0430  CALCIUM 7.7* 8.0* 7.5* 7.5* 7.7*  MG 2.4 2.5*  --  2.1 2.3  PHOS 8.8*  --   --   --   --     CBC Recent Labs  Lab 12/07/17 1729 12/08/17 0334 12/09/17 0430  WBC 11.7* 10.1 13.6*  HGB 7.3* 7.0* 7.4*  HCT 20.9* 20.1* 22.2*  PLT 113* 93* 146*    Coag's Recent Labs  Lab 12/07/17 0516 12/08/17 0334 12/09/17 0430  APTT 34 35 37*  INR 1.59 1.69 1.72    Sepsis Markers No results for input(s): LATICACIDVEN, PROCALCITON, O2SATVEN in the last 168 hours.  ABG Recent Labs  Lab 12/07/17 0300 12/08/17 0320 12/09/17 0243  PHART 7.429 7.473* 7.377  PCO2ART 27.6* 33.0 38.5  PO2ART 182* 71.9* 91.0    Liver Enzymes Recent Labs  Lab 12/07/17 0516 12/08/17 0334 12/09/17 0430  AST 214* 615* 292*  ALT 167* 587* 504*  ALKPHOS 51 49 88  BILITOT 0.9 1.2 1.2  ALBUMIN 2.6* 2.4* 2.5*    Cardiac Enzymes Recent Labs  Lab 12/05/17 2349  TROPONINI 0.57*    Glucose Recent Labs  Lab 12/08/17 1151 12/08/17 1527 12/08/17 2144 12/09/17 0031 12/09/17 0430 12/09/17 0800  GLUCAP 105* 125* 119* 96 171* 106*    Imaging Dg Chest Port 1 View  Result Date: 12/09/2017 CLINICAL DATA:  Status post craniotomy 4 days ago. EXAM: PORTABLE CHEST 1 VIEW COMPARISON:  Portable chest x-ray of December 08, 2017 FINDINGS: The trachea has been extubated. The lungs are reasonably well inflated. The interstitial markings are increased and slightly more conspicuous today. The cardiac silhouette remains enlarged. There is a small left pleural effusion. There is likely left lower lobe atelectasis. The feeding tube tip projects below the inferior margin of the image. The dual-lumen left internal jugular venous catheter tip projects over the proximal SVC. The right internal jugular venous  catheter tip also projects over the proximal portion of the SVC. IMPRESSION: Interval extubation of the trachea. Mild interstitial prominence bilaterally consistent with mild interstitial edema. Left lower lobe atelectasis and small left pleural effusion. Electronically Signed   By: David  Martinique M.D.   On: 12/09/2017 07:57     STUDIES:  CT Head 2/24 > 1. Large right subdural hematoma measuring up to 16 mm in width with significant mass effect and 13 mm of right-to-left midline shift. 2. Mass effect effaces the sulci and right lateral ventricle. 3. Uncal herniation is present. 4. Mild prominence of the atrium of the left lateral ventricle in  left temporal tip may reflect early evidence of a trapped ventricle. CXR 2/24 > 1. Endotracheal tube is well positioned. 2. Well-positioned nasal/orogastric tube curling in the mid esophagus. Tip projects above the included field of view, within the neck. 3. Stable cardiomegaly. No acute findings in the lungs.     CULTURES: None.   ANTIBIOTICS: None.   SIGNIFICANT EVENTS: 2/23 > Presents to ED  2/24 > Taken to OR   LINES/TUBES: ETT 2/24 >> Right IJ central line December 07, 2017. Left HD catheter IJ December 07, 2017 Chronic Foley that is changed every month as an outpatient because of bladder spasms  DISCUSSION: 72 year old male with mechanical fall on 2/22 presents to ED on 2/23 with weakness and headache. CT Head with large right SDH with 13 mm right to left midline shift. Taken to OR for craniotomy and evacuation of hematoma  Date December 07, 2017 patient was severely hypoglycemic he was started on D10 most likely due to stress and inability to eat chronic renal insufficiency he also had need for emergent central line that was done dialysis was attempted but not able to be done due to clot in his AV fistula due to clot most likely due to hypotension were planning to do HD catheter he was anemic he was transfused.  On February 27  patient was extubated.   On February 27 NG tube placed On February 28 patient is extubated had dialysis On February 28 patient is off D5W for hypoglycemia he is getting tube feeds For bladder spasms we started the patient oxybutynin   ASSESSMENT / PLAN:  PULMONARY A: Respiratory Insufficieny s/p SDH  P:   Patient extubated on December 08, 2017  Patient is weak on the left side Pulmonary toilet And foot speech pathology to assess for dysphagia  CARDIOVASCULAR A:  Systolic/Diastolic HF (EF 38-75 on 03/4331)  H/O HTN, HLD, DVT   P:  Cardiac Monitoring  Maintain Systolic <951 and >88  Hold Coreg  RENAL A:   ESRD with HD T/R/S Hyperkalemia  Chronic Foley  P:   Trend BMP > repeat at midnight  Replace electrolytes as indicated  Nephrology following [place]  Patient had left IJ HD catheter he was dialyzed yesterday nephrology following. For the AVF clot nephrology is following  GASTROINTESTINAL A:   SUP Transaminitis H/O GERD  P:   N.p.o. might need to start him tube feed since I think his course will be much more prolonged and with the family is anticipating. NPO PPI  Transaminitis enzymes levels are improving no any further workup we will trend them daily hold any statin.    HEMATOLOGIC A:   Acute Blood loss anemia  P:  Trend CBC  Transfuse for Hemoglobin <7 Transfused 2 units. Patient has drop in his hemoglobin most likely because of the hematoma and bleeding before during and after the OR time he will need to be transfused these autoantibodies at this point waiting for the blood to arrive if it becomes an emergency release blood emergently to transfuse him.  INFECTIOUS A:   No issues  P:   Trend WBC and Fever Curve  Change foley out   ENDOCRINE A:   DM    P:   Trend Glucose   Severe hypoglycemia unknown etiology most likely because of the glipizide is still in his system will hold that and start D10 trend glucose.  Patient to continue with  his defect and to keep his sugar above the hypoglycemia range  this is most likely due to the glipizide. Hypoglycemia corrected we will continue to hold glipizide when he gets into high range will start on sliding scale insulin.  NEUROLOGIC A:   SDH with mass effect s/p Craniotomy S/P Mechanical Fall   P:   Patient is extubated he is managed by the neurosurgical team is primary team will continue to comanage him in the next 24 hours patient will be stable to go to stepdown unit either under the neurosurgical care or hospitalist care.   FAMILY  - Updates: Family updated at bedside   - Inter-disciplinary family meet or Palliative Care meeting due by: 12/12/2017     Pulmonary and [place] Pager: [phone]  12/09/2017, 10:54 AM

## 2017-12-09 NOTE — Plan of Care (Signed)
Pt more alert and oriented but remains, per family, off from baseline at this time.

## 2017-12-09 NOTE — Evaluation (Signed)
Clinical/Bedside Swallow Evaluation Patient Details  Name: Albert Stanley MRN: [id number] Date of Birth: 17-Jan-1946  Today's Date: 12/09/2017 Time: SLP Start Time (ACUTE ONLY): 55 SLP Stop Time (ACUTE ONLY): 1200 SLP Time Calculation (min) (ACUTE ONLY): 30 min  Past Medical History:  Past Medical History:  Diagnosis Date  . Arthritis    Osteoarthritis  . Asthma   . Bladder cancer ([place]) dx'd 1990   surg only  . CHF (congestive heart failure) ([place])   . Diabetes mellitus without complication ([place])   . ESRD (end stage renal disease) ([name])    Tues, Thurs, Sat dialysis  . GERD (gastroesophageal reflux disease)   . History of cardiac catheterization    a. LHC 5/17: no obstructive CAD  . History of DVT (deep vein thrombosis)    2003 ish  . History of nuclear stress test    a. Myoview 4/17: EF 39%, inf, inf-lat, apical inf, apical lat, apical scar, intermediate risk  . Hyperlipidemia   . Hypertension   . NICM (nonischemic cardiomyopathy) ([place])    a. Echo 2/17: mod LVH, EF 35-40%, inf-lat and inf-sept HK, mod MR, severe LAE, small pericardial effusion   Past Surgical History:  Past Surgical History:  Procedure Laterality Date  . arthroscopic knee surgery Left   . AV FISTULA PLACEMENT Left 12/13/2015   Procedure: ARTERIOVENOUS (AV) FISTULA CREATION;  Surgeon: Angelia Mould, MD;  Location: [facility];  Service: Vascular;  Laterality: Left;  . BACK SURGERY     2 times  1960  . CARDIAC CATHETERIZATION N/A 02/28/2016   Procedure: Left Heart Cath and Coronary Angiography;  Surgeon: Larey Dresser, MD;  Location: [facility];  Service: Cardiovascular;  Laterality: N/A;  . CRANIOTOMY N/A 12/05/2017   Procedure: CRANIOTOMY HEMATOMA EVACUATION SUBDURAL;  Surgeon: Ashok Pall, MD;  Location: [facility];  Service: Neurosurgery;  Laterality: N/A;  . CYSTOSCOPY    . FISTULA SUPERFICIALIZATION Left 08/14/2016   Procedure: FISTULA SUPERFICIALIZATION LEFT UPPER ARM;  Surgeon: Angelia Mould, MD;  Location: [facility];  Service: Vascular;  Laterality: Left;  . INSERTION OF DIALYSIS CATHETER N/A 12/13/2015   Procedure: INSERTION OF DIALYSIS CATHETER;  Surgeon: Angelia Mould, MD;  Location: [facility];  Service: Vascular;  Laterality: N/A;  . INSERTION OF DIALYSIS CATHETER Left 05/07/2017   Procedure: INSERTION OF DIALYSIS CATHETER LEFT INTERNAL JUGULAR PLACEMENT;  Surgeon: Angelia Mould, MD;  Location: [facility];  Service: Vascular;  Laterality: Left;  . IR RADIOLOGIST EVAL & MGMT  05/12/2017  . JOINT REPLACEMENT Left    hip  . LIGATION OF COMPETING BRANCHES OF ARTERIOVENOUS FISTULA Left 08/14/2016   Procedure: LIGATION OF COMPETING BRANCHES OF ARTERIOVENOUS FISTULA LEFT UPPER ARM;  Surgeon: Angelia Mould, MD;  Location: [facility];  Service: Vascular;  Laterality: Left;  . PERIPHERAL VASCULAR CATHETERIZATION Left 06/09/2016   Procedure: Fistulagram;  Surgeon: Serafina Mitchell, MD;  Location: [facility];  Service: Cardiovascular;  Laterality: Left;  ARM  . PERIPHERAL VASCULAR CATHETERIZATION Left 06/09/2016   Procedure: Peripheral Vascular Balloon Angioplasty;  Surgeon: Serafina Mitchell, MD;  Location: [facility];  Service: Cardiovascular;  Laterality: Left;  upper arm venous  . PERIPHERAL VASCULAR CATHETERIZATION Left 06/25/2016   Procedure: Fistulagram;  Surgeon: Serafina Mitchell, MD;  Location: [facility];  Service: Cardiovascular;  Laterality: Left;  . REVISON OF ARTERIOVENOUS FISTULA Left 05/07/2017   Procedure: REPAIR OF PSEUDOANEURYSM  LEFT BRACHIO-CEPHALIC  ARM ARTERIOVENOUS FISTULA;  Surgeon: Angelia Mould, MD;  Location: [facility];  Service: Vascular;  Laterality: Left;   HPI:  Fall with intracranial hemorrhage subdural hematoma, s/p craniotomy   Assessment / Plan / Recommendation Clinical Impression   Pt presents lethargy impacting participation in today's evaluation.  Despite repositioning, oral care, and cold compress, pt was unable to  maintain sufficient alertness for safe PO intake.  Pt would not even orally accept small amounts of liquids via suction toothette.  Oral care was completed to minimize bacterial load.  Provided education to pt's wife regarding pt's current limitations and plan of care.  Pt's vocal quality was hoarse, suspect related to intubation.  Therefore, once alertness improves, pt may benefit from objective swallow study to determine safest diet consistencies.  SLP Visit Diagnosis: Dysphagia, unspecified (R13.10)    Aspiration Risk  Severe aspiration risk    Diet Recommendation NPO;Alternative means - temporary   Medication Administration: Via alternative means    Other  Recommendations Oral Care Recommendations: Oral care QID   Follow up Recommendations Inpatient Rehab      Frequency and Duration min 3x week          Prognosis Prognosis for Safe Diet Advancement: Good      Swallow Study   General HPI: Fall with intracranial hemorrhage subdural hematoma, s/p craniotomy Type of Study: Bedside Swallow Evaluation Previous Swallow Assessment: none on record Diet Prior to this Study: NPO Temperature Spikes Noted: No Respiratory Status: Nasal cannula History of Recent Intubation: Yes Length of Intubations (days): 3 days Date extubated: 12/08/17 Behavior/Cognition: Lethargic/Drowsy Oral Cavity Assessment: Within Functional Limits Oral Care Completed by SLP: Yes Oral Cavity - Dentition: Missing dentition Patient Positioning: Upright in bed Baseline Vocal Quality: Hoarse Volitional Cough: Cognitively unable to elicit Volitional Swallow: Unable to elicit    Oral/Motor/Sensory Function Overall Oral Motor/Sensory Function: Other (comment)(difficult to assess, face and movements appear symmetrical)   Ice Chips     Thin Liquid Thin Liquid: Not tested    Nectar Thick     Honey Thick     Puree     Solid   GO            Albert Stanley, Albert Stanley 12/09/2017,12:27 PM

## 2017-12-09 NOTE — Procedures (Signed)
Patient seen on Hemodialysis. QB 350, UF goal 3.5L Treatment adjusted as needed.  Elmarie Shiley MD [facility]. Office # [phone] Pager # [phone] 10:19 AM

## 2017-12-10 LAB — CBC WITH DIFFERENTIAL/PLATELET
BASOS ABS: 0 10*3/uL (ref 0.0–0.1)
Basophils Relative: 0 %
Eosinophils Absolute: 0.1 10*3/uL (ref 0.0–0.7)
Eosinophils Relative: 1 %
HEMATOCRIT: 22 % — AB (ref 39.0–52.0)
Hemoglobin: 7.4 g/dL — ABNORMAL LOW (ref 13.0–17.0)
LYMPHS PCT: 6 %
Lymphs Abs: 0.7 10*3/uL (ref 0.7–4.0)
MCH: 30.6 pg (ref 26.0–34.0)
MCHC: 33.6 g/dL (ref 30.0–36.0)
MCV: 90.9 fL (ref 78.0–100.0)
MONO ABS: 1 10*3/uL (ref 0.1–1.0)
MONOS PCT: 9 %
NEUTROS ABS: 9.4 10*3/uL — AB (ref 1.7–7.7)
Neutrophils Relative %: 84 %
Platelets: 170 10*3/uL (ref 150–400)
RBC: 2.42 MIL/uL — ABNORMAL LOW (ref 4.22–5.81)
RDW: 17.6 % — AB (ref 11.5–15.5)
WBC: 11.2 10*3/uL — ABNORMAL HIGH (ref 4.0–10.5)

## 2017-12-10 LAB — GLUCOSE, CAPILLARY
GLUCOSE-CAPILLARY: 87 mg/dL (ref 65–99)
GLUCOSE-CAPILLARY: 138 mg/dL — AB (ref 65–99)
GLUCOSE-CAPILLARY: 157 mg/dL — AB (ref 65–99)
GLUCOSE-CAPILLARY: 146 mg/dL — AB (ref 65–99)
Glucose-Capillary: 120 mg/dL — ABNORMAL HIGH (ref 65–99)
Glucose-Capillary: 136 mg/dL — ABNORMAL HIGH (ref 65–99)
Glucose-Capillary: 74 mg/dL (ref 65–99)

## 2017-12-10 LAB — COMPREHENSIVE METABOLIC PANEL
ALK PHOS: 120 U/L (ref 38–126)
ALT: 503 U/L — ABNORMAL HIGH (ref 17–63)
AST: 337 U/L — AB (ref 15–41)
Albumin: 2.5 g/dL — ABNORMAL LOW (ref 3.5–5.0)
Anion gap: 13 (ref 5–15)
BILIRUBIN TOTAL: 1.8 mg/dL — AB (ref 0.3–1.2)
BUN: 43 mg/dL — AB (ref 6–20)
CALCIUM: 8.2 mg/dL — AB (ref 8.9–10.3)
CO2: 26 mmol/L (ref 22–32)
Chloride: 97 mmol/L — ABNORMAL LOW (ref 101–111)
Creatinine, Ser: 5.54 mg/dL — ABNORMAL HIGH (ref 0.61–1.24)
GFR calc Af Amer: 11 mL/min — ABNORMAL LOW (ref >60)
GFR, EST NON AFRICAN AMERICAN: 9 mL/min — AB (ref >60)
Glucose, Bld: 112 mg/dL — ABNORMAL HIGH (ref 65–99)
Potassium: 5.2 mmol/L — ABNORMAL HIGH (ref 3.5–5.1)
Sodium: 136 mmol/L (ref 135–145)
TOTAL PROTEIN: 5.4 g/dL — AB (ref 6.5–8.1)

## 2017-12-10 LAB — MAGNESIUM: Magnesium: 2.3 mg/dL (ref 1.7–2.4)

## 2017-12-10 LAB — APTT: APTT: 36 s (ref 24–36)

## 2017-12-10 MED ORDER — PANTOPRAZOLE SODIUM 40 MG PO PACK
40.0000 mg | PACK | Freq: Every day | ORAL | Status: DC
  Administered 2017-12-10 – 2017-12-14 (×5): 40 mg
  Filled 2017-12-10 (×5): qty 20

## 2017-12-10 MED ORDER — QUETIAPINE FUMARATE 25 MG PO TABS
25.0000 mg | ORAL_TABLET | Freq: Two times a day (BID) | ORAL | Status: DC
  Administered 2017-12-10 – 2017-12-14 (×8): 25 mg via ORAL
  Filled 2017-12-10 (×9): qty 1

## 2017-12-10 MED ORDER — INSULIN ASPART 100 UNIT/ML ~~LOC~~ SOLN
2.0000 [IU] | Freq: Three times a day (TID) | SUBCUTANEOUS | Status: DC
  Administered 2017-12-10 – 2017-12-13 (×6): 4 [IU] via SUBCUTANEOUS

## 2017-12-10 MED ORDER — HALOPERIDOL LACTATE 5 MG/ML IJ SOLN: 5.0000 mg | Freq: Four times a day (QID) | INTRAMUSCULAR | Status: DC | PRN

## 2017-12-10 MED ORDER — HALOPERIDOL 1 MG PO TABS
5.0000 mg | ORAL_TABLET | Freq: Three times a day (TID) | ORAL | Status: DC | PRN
  Administered 2017-12-11 (×2): 5 mg via ORAL
  Filled 2017-12-10: qty 1
  Filled 2017-12-10: qty 5

## 2017-12-10 NOTE — Progress Notes (Addendum)
PULMONARY / CRITICAL CARE MEDICINE   Name: Albert Stanley MRN: [id number] DOB: 1946/01/04    ADMISSION DATE:  12/05/2017 CONSULTATION DATE: December 05, 2017  REFERRING MD: Emergency room  CHIEF COMPLAINT: Fall with intracranial hemorrhage subdural hematoma  HISTORY OF PRESENT ILLNESS:   72 year old male with PMH of ESRD with HD T/T/Sat, GERD, CHF, DM, Remote Bladder Cancer (1990), H/O DVT (not on anticoagulation), HLD, HTN  Presents to ED on 2/23 after fall on 2/22. Denies hitting head. Woke up this morning dizzy and with unsteady gait. CT Head with large ride sided SDH with SAH component. Right Pupil 31mm and fixed. Neurosurgery consulted. Taken to OR for emergent craniotomy for hematoma evacuation. Remained intubated in post-operative states. PCCM consulted. Patient was severely hypoglycemic overnight he was started on D10 he also had need for central line that was done he also had need for blood transfusion that was done he also had need for a new HD catheter placed yesterday for AV fistula clot most likely because of hypotension today patient is weaning he has transaminitis and the blood work.  He had full HD dialysis session today. Patient had NG tube catheter placed he is under treating his of the D10 water and he is not hypoglycemic anymore we are holding his glipizide and insulin.  PAST MEDICAL HISTORY :  He  has a past medical history of Arthritis, Asthma, Bladder cancer ([place]) (dx'd 1990), CHF (congestive heart failure) ([place]), Diabetes mellitus without complication ([place]), ESRD (end stage renal disease) ([place]), GERD (gastroesophageal reflux disease), History of cardiac catheterization, History of DVT (deep vein thrombosis), History of nuclear stress test, Hyperlipidemia, Hypertension, and NICM (nonischemic cardiomyopathy) ([place]).  PAST SURGICAL HISTORY: He  has a past surgical history that includes Back surgery; Joint replacement (Left); arthroscopic knee surgery (Left); Cystoscopy;  AV fistula placement (Left, 12/13/2015); Insertion of dialysis catheter (N/A, 12/13/2015); Cardiac catheterization (N/A, 02/28/2016); Cardiac catheterization (Left, 06/09/2016); Cardiac catheterization (Left, 06/09/2016); Cardiac catheterization (Left, 06/25/2016); Fistula superficialization (Left, 08/14/2016); Ligation of competing branches of arteriovenous fistula (Left, 08/14/2016); Revison of arteriovenous fistula (Left, 05/07/2017); Insertion of dialysis catheter (Left, 05/07/2017); IR Radiologist Eval & Mgmt (05/12/2017); and Craniotomy (N/A, 12/05/2017).  Allergies  Allergen Reactions  . Meperidine Shortness Of Breath and Other (See Comments)    Increased heart rate Reaction to demerol  . Sulfa Antibiotics Shortness Of Breath and Other (See Comments)    Increased heart rate  . Sulfonamide Derivatives Shortness Of Breath and Other (See Comments)    Increased heart rate  . Eggs Or Egg-Derived Products Nausea And Vomiting     Reaction to egg yolks - not whites    . Foltx [Elfolate Plus] Other (See Comments)    Pt does not recall reaction to this  . [place] Diarrhea, Nausea And Vomiting and Other (See Comments)    Constipation Reaction to whole milk  . Lactose Intolerance (Gi) Diarrhea, Nausea And Vomiting and Other (See Comments)    Constipation Reaction to whole milk  . Milk-Related Compounds Diarrhea, Nausea And Vomiting and Other (See Comments)    Constipation Reaction to whole milk  . Penicillins Other (See Comments)    "Knocks me out."   Has patient had a PCN reaction causing immediate rash, facial/tongue/throat swelling, SOB or lightheadedness with hypotension: yes- blacked out Has patient had a PCN reaction causing severe rash involving mucus membranes or skin necrosis: unknown Has patient had a PCN reaction that required hospitalization: at MD office Has patient had a PCN reaction  occurring within the last 10 years: no If all of the above answers are "NO", then may proceed with  Cephalosporin use.   . Betadine [Povidone Iodine] Itching, Rash and Other (See Comments)    burning    No current facility-administered medications on file prior to encounter.    Current Outpatient Medications on File Prior to Encounter  Medication Sig  . acetaminophen (TYLENOL) 500 MG tablet Take 1,000 mg by mouth every 6 (six) hours as needed for mild pain.  Marland Kitchen albuterol (PROVENTIL HFA;VENTOLIN HFA) 108 (90 Base) MCG/ACT inhaler Inhale 1-2 puffs into the lungs every 6 (six) hours as needed for wheezing or shortness of breath.  . allopurinol (ZYLOPRIM) 300 MG tablet Take 300 mg by mouth daily.   Marland Kitchen amiodarone (PACERONE) 200 MG tablet Take 1 tablet (200 mg total) by mouth daily.  . calcitRIOL (ROCALTROL) 0.5 MCG capsule Take 0.5 mcg by mouth Every Tuesday,Thursday,and Saturday with dialysis. Medication given at dialysis on Tues Thurs Sat  . diphenhydrAMINE (BENADRYL) 25 MG tablet Take 25 mg by mouth 2 (two) times daily as needed for itching or allergies.   Marland Kitchen glipiZIDE (GLUCOTROL) 5 MG tablet Take 0.5 tablets (2.5 mg total) by mouth 2 (two) times daily. (Patient taking differently: Take 2.5 mg by mouth See admin instructions. Take as needed...one tablet in the morning if BS is greater than 80.)  . Menthol, Topical Analgesic, (ICY HOT EX) Apply 1 application topically 3 (three) times daily as needed (pain).  . multivitamin (RENA-VIT) TABS tablet Take 1 tablet by mouth at bedtime.   Marland Kitchen oxyCODONE-acetaminophen (PERCOCET) 7.5-325 MG tablet Take 1 tablet by mouth 3 (three) times daily as needed (pain). (Patient taking differently: Take 1 tablet by mouth 4 (four) times daily as needed (pain). )  . sevelamer carbonate (RENVELA) 800 MG tablet Take 2 tablets (1,600 mg total) by mouth 3 (three) times daily with meals. (Patient taking differently: Take 2,400 mg by mouth See admin instructions. Take 3 tablets (2400 mg) by mouth daily with meals - usually twice daily)  . carvedilol (COREG) 3.125 MG tablet Take 1  tablet (3.125 mg total) by mouth See admin instructions. Take 1 tablet (3.125 mg) by mouth twice daily on Monday, Wednesday, Friday and Sunday (non-dialysis days) (Patient not taking: Reported on 12/04/2017)  . famotidine (PEPCID) 20 MG tablet Take 1 tablet (20 mg total) by mouth 2 (two) times daily as needed (sour stomach). (Patient not taking: Reported on 12/04/2017)    FAMILY HISTORY:  His indicated that his mother is deceased. He indicated that his father is deceased. He indicated that the status of his sister is unknown. He indicated that the status of his brother is unknown. He indicated that the status of his daughter is unknown. He indicated that the status of his paternal uncle is unknown.   SOCIAL HISTORY: He  reports that he quit smoking about 19 years ago. He quit after 30.00 years of use. he has never used smokeless tobacco. He reports that he does not drink alcohol or use drugs.  REVIEW OF SYSTEMS:   Unable to obtain due to patient's conditions  SUBJECTIVE:  Patient is intubated and agitated.  VITAL SIGNS: BP 127/76   Pulse (!) 120   Temp (!) 100.4 F (38 C)   Resp (!) 24   Ht 5\' 9"  (1.753 m)   Wt 208 lb 5.4 oz (94.5 kg)   SpO2 100%   BMI 30.77 kg/m   HEMODYNAMICS:    VENTILATOR SETTINGS:  INTAKE / OUTPUT: I/O last 3 completed shifts: In: 1625 [I.V.:30; Other:100; NG/GT:1495] Out: 3040 [Urine:40; Other:3000]  PHYSICAL EXAMINATION: General:  Adult male on extubated Neuro:   Not sedated lethargic but easily arousable pupils intact  HEENT:  Scleral edema, post-op dressing in place  Cardiovascular:  Loletha Grayer, no MRG  Lungs:  Clear breath sounds, no wheeze  Abdomen:  Obese, active bowel sounds  Musculoskeletal:  2/5 over the left side 4/5 in the right side. Skin:  Warm, dry      LABS:  BMET Recent Labs  Lab 12/08/17 0334 12/09/17 0430 12/10/17 0517  NA 133* 132* 136  K 5.1 5.9* 5.2*  CL 97* 95* 97*  CO2 21* 21* 26  BUN 45* 61* 43*  CREATININE  6.87* 8.23* 5.54*  GLUCOSE 170* 165* 112*    Electrolytes Recent Labs  Lab 12/06/17 0256  12/08/17 0334 12/09/17 0430 12/10/17 0517  CALCIUM 7.7*   < > 7.5* 7.7* 8.2*  MG 2.4   < > 2.1 2.3 2.3  PHOS 8.8*  --   --   --   --    < > = values in this interval not displayed.    CBC Recent Labs  Lab 12/08/17 0334 12/09/17 0430 12/10/17 0517  WBC 10.1 13.6* 11.2*  HGB 7.0* 7.4* 7.4*  HCT 20.1* 22.2* 22.0*  PLT 93* 146* 170    Coag's Recent Labs  Lab 12/07/17 0516 12/08/17 0334 12/09/17 0430 12/10/17 0517  APTT 34 35 37* 36  INR 1.59 1.69 1.72  --     Sepsis Markers No results for input(s): LATICACIDVEN, PROCALCITON, O2SATVEN in the last 168 hours.  ABG Recent Labs  Lab 12/07/17 0300 12/08/17 0320 12/09/17 0243  PHART 7.429 7.473* 7.377  PCO2ART 27.6* 33.0 38.5  PO2ART 182* 71.9* 91.0    Liver Enzymes Recent Labs  Lab 12/08/17 0334 12/09/17 0430 12/10/17 0517  AST 615* 292* 337*  ALT 587* 504* 503*  ALKPHOS 49 88 120  BILITOT 1.2 1.2 1.8*  ALBUMIN 2.4* 2.5* 2.5*    Cardiac Enzymes Recent Labs  Lab 12/05/17 2349  TROPONINI 0.57*    Glucose Recent Labs  Lab 12/09/17 1115 12/09/17 1500 12/09/17 1947 12/10/17 0021 12/10/17 0537 12/10/17 0840  GLUCAP 97 125* 84 136* 87 138*    Imaging No results found.   STUDIES:  CT Head 2/24 > 1. Large right subdural hematoma measuring up to 16 mm in width with significant mass effect and 13 mm of right-to-left midline shift. 2. Mass effect effaces the sulci and right lateral ventricle. 3. Uncal herniation is present. 4. Mild prominence of the atrium of the left lateral ventricle in left temporal tip may reflect early evidence of a trapped ventricle. CXR 2/24 > 1. Endotracheal tube is well positioned. 2. Well-positioned nasal/orogastric tube curling in the mid esophagus. Tip projects above the included field of view, within the neck. 3. Stable cardiomegaly. No acute findings in the  lungs.     CULTURES: None.   ANTIBIOTICS: None.   SIGNIFICANT EVENTS: 2/23 > Presents to ED  2/24 > Taken to OR   LINES/TUBES: ETT 2/24 >> Right IJ central line December 07, 2017. Left HD catheter IJ December 07, 2017 Chronic Foley that is changed every month as an outpatient because of bladder spasms Extubated December 09, 2017 Central line is being removed December 10, 2017 He has left HD IJ catheter day #4  DISCUSSION: 72 year old male with mechanical fall on 2/22 presents to ED on  2/23 with weakness and headache. CT Head with large right SDH with 13 mm right to left midline shift. Taken to OR for craniotomy and evacuation of hematoma  Date December 07, 2017 patient was severely hypoglycemic he was started on D10 most likely due to stress and inability to eat chronic renal insufficiency he also had need for emergent central line that was done dialysis was attempted but not able to be done due to clot in his AV fistula due to clot most likely due to hypotension were planning to do HD catheter he was anemic he was transfused.  On February 27 patient was extubated.   On February 27 NG tube placed On February 28 patient is extubated had dialysis On February 28 patient is off D5W for hypoglycemia he is getting tube feeds For bladder spasms we started the patient oxybutynin   ASSESSMENT / PLAN:  PULMONARY A: Respiratory Insufficieny s/p SDH  P:   Patient extubated on December 08, 2017  Patient is weak on the left side Pulmonary toilet And foot speech pathology to assess for dysphagia  CARDIOVASCULAR A:  Systolic/Diastolic HF (EF 42-35 on 12/6142)  H/O HTN, HLD, DVT   P:  Cardiac Monitoring  Maintain Systolic <315 and >40  Hold Coreg  RENAL A:   ESRD with HD T/R/S Hyperkalemia  Chronic Foley  P:   Trend BMP  he is on dialysis 3 times a week Replace electrolytes as indicated  Nephrology following [facility]  Patient had left IJ HD catheter he was dialyzed  yesterday nephrology following. For the AVF clot nephrology is following  GASTROINTESTINAL A:   SUP Transaminitis H/O GERD  P:   N.p.o. might need to start him tube feed since I think his course will be much more prolonged and with the family is anticipating. NPO PPI  Transaminitis enzymes levels are improving no any further workup we will trend them daily hold any statin.    HEMATOLOGIC A:   Acute Blood loss anemia  P:  Trend CBC  Transfuse for Hemoglobin <7 Transfused 2 units. Patient has drop in his hemoglobin most likely because of the hematoma and bleeding before during and after the OR time he will need to be transfused these autoantibodies at this point waiting for the blood to arrive if it becomes an emergency release blood emergently to transfuse him.  INFECTIOUS A:   No issues  Low-grade fever this point we will remove the central line and observe P:   Trend WBC and Fever Curve  white count is improving Change foley out   ENDOCRINE A:   DM    P:   Trend Glucose   Severe hypoglycemia unknown etiology most likely because of the glipizide is still in his system will hold that and start D10 trend glucose.  Patient to continue with his defect and to keep his sugar above the hypoglycemia range this is most likely due to the glipizide. Hypoglycemia corrected we will continue to hold glipizide when he gets into high range will start on sliding scale insulin.  NEUROLOGIC A:   SDH with mass effect s/p Craniotomy S/P Mechanical Fall   P:   Patient is extubated he is managed by the neurosurgical team is primary team will continue to comanage him in the next 24 hours patient will be stable to go to stepdown unit either under the neurosurgical care or hospitalist care.   FAMILY  - Updates: Family updated at bedside   - Inter-disciplinary family meet or  Palliative Care meeting due by: 12/12/2017   Patient is ready to go to the stepdown unit or monitored bed.  We  will coordinate that with the triad hospitalist and the neurosurgical services  Pulmonary and [place] Pager: [phone]  12/10/2017, 9:41 AM

## 2017-12-10 NOTE — Progress Notes (Signed)
  Speech Language Pathology Treatment: Dysphagia  Patient Details Name: JALENE LACKO MRN: [id number] DOB: Jan 08, 1946 Today's Date: 12/10/2017 Time: 1017-5102 SLP Time Calculation (min) (ACUTE ONLY): 29 min  Assessment / Plan / Recommendation Clinical Impression  Though pt remains drowsy with difficulty sustaining eyes open he is able to participate in very appropriate conversation, take PO with assist and follow 100% of commands. Pt demonstrated one throat clear after first sip of liquid, but took subsequent large consecutive sips with no further cough or wet vocal quality. Applesauce tolerated without incident, but pt struggled to masticate dry cracker due to dry oral mucosa and decreased sustained attention to task. Given that pt has an NG tube for now, recommend we slowly advance him to PO. RN may given sips of thin liquids with assist when pt is alert, or bites of puree or pudding from floor stock. If arousal consistently improves will likely advance to meals tomorrow.    HPI HPI: Fall with intracranial hemorrhage subdural hematoma, s/p craniotomy      SLP Plan  Continue with current plan of care       Recommendations  Diet recommendations: Other(comment)(sips of water, bites of puree when alert) Liquids provided via: Straw Medication Administration: Via alternative means Supervision: Staff to assist with self feeding Compensations: Slow rate;Small sips/bites Postural Changes and/or Swallow Maneuvers: Seated upright 90 degrees                Oral Care Recommendations: Oral care QID Follow up Recommendations: Inpatient Rehab SLP Visit Diagnosis: Dysphagia, unspecified (R13.10) Plan: Continue with current plan of care       GO                Michalle Rademaker, Katherene Ponto 12/10/2017, 9:33 AM

## 2017-12-10 NOTE — Progress Notes (Signed)
Rehab Admissions Coordinator Note:  Patient was screened by Cleatrice Burke for appropriateness for an Inpatient Acute Rehab Consult per PT recommendations.  At this time, we are recommending Inpatient Rehab consult. Please place order for consult if patient/family would like to be considered for admission to CIR.  Cleatrice Burke 12/10/2017, 12:18 PM  I can be reached at [phone].

## 2017-12-10 NOTE — Evaluation (Signed)
Physical Therapy Evaluation Patient Details Name: Albert Stanley: [id number] DOB: 1946-04-04 Today's Date: 12/10/2017   History of Present Illness  72 year old male with PMH of ESRD with HD T/R/Sat, GERD, CHF, DM, Remote Bladder Cancer (1990), H/O DVT (not on anticoagulation), HLD, HTN  Clinical Impression  Pt admitted with/for SDH, s/p crani for evacuation.  Pt presently needing significant assist and redirection to task.  Pt currently limited functionally due to the problems listed. ( See problems list.)   Pt will benefit from PT to maximize function and safety in order to get ready for next venue listed below.     Follow Up Recommendations CIR;Supervision/Assistance - 24 hour    Equipment Recommendations  Other (comment)(TBA next venue)    Recommendations for Other Services Rehab consult     Precautions / Restrictions Precautions Precautions: Fall      Mobility  Bed Mobility Overal bed mobility: Needs Assistance Bed Mobility: Rolling;Sidelying to Sit;Sit to Supine Rolling: Mod assist Sidelying to sit: Max assist   Sit to supine: Max assist   General bed mobility comments: cues for sequence, step by step direction due to inattention  Transfers Overall transfer level: Needs assistance   Transfers: Sit to/from Stand;Lateral/Scoot Transfers Sit to Stand: Max assist;From elevated surface        Lateral/Scoot Transfers: Max assist;From elevated surface General transfer comment: cues for technique, and focus on task, assist to come forward, support L side and boost.  Ambulation/Gait                Stairs            Wheelchair Mobility    Modified Rankin (Stroke Patients Only) Modified Rankin (Stroke Patients Only) Pre-Morbid Rankin Score: No symptoms Modified Rankin: Severe disability     Balance Overall balance assessment: Needs assistance Sitting-balance support: Feet supported;Single extremity supported;Bilateral upper extremity  supported Sitting balance-Leahy Scale: Fair Sitting balance - Comments: can maintain balance and complete simple dynamic task if cued to keep focus, otherwise lists posteriorly.     Standing balance-Leahy Scale: Zero                               Pertinent Vitals/Pain Pain Assessment: No/denies pain    Home Living Family/patient expects to be discharged to:: Private residence Living Arrangements: Spouse/significant other Available Help at Discharge: Family;Available PRN/intermittently(per husband wife works, ? if family could provide 80* care.) Type of Home: House Home Access: Stairs to enter Entrance Stairs-Rails: Psychiatric nurse of Steps: 5 Home Layout: Two level Home Equipment: Environmental consultant - 2 wheels;Crutches      Prior Function Level of Independence: Independent with assistive device(s)         Comments: pt reports independent in ADL;s and drove     Hand Dominance        Extremity/Trunk Assessment   Upper Extremity Assessment Upper Extremity Assessment: RUE deficits/detail;LUE deficits/detail RUE Deficits / Details: functional , but mild weakness, not formally tested due to difficulty with focus RUE Sensation: (pt reports intact light touch to palpation) LUE Deficits / Details: decreased attention to left with hand/arm.  gross movement preserved and weak LUE Coordination: decreased fine motor    Lower Extremity Assessment Lower Extremity Assessment: RLE deficits/detail;LLE deficits/detail RLE Deficits / Details: weak, but functional at 4/5, decreased focus on MMT LLE Deficits / Details: moves grossly with assist, uncoordinated, pt inattentive to L LE.  little isolation.  Communication      Cognition Arousal/Alertness: Awake/alert Behavior During Therapy: Restless;Impulsive Overall Cognitive Status: Impaired/Different from baseline                                        General Comments      Exercises      Assessment/Plan    PT Assessment Patient needs continued PT services  PT Problem List Decreased strength;Decreased activity tolerance;Decreased balance;Decreased mobility;Decreased coordination;Decreased cognition;Decreased safety awareness       PT Treatment Interventions Gait training;DME instruction;Functional mobility training;Therapeutic activities;Therapeutic exercise;Neuromuscular re-education;Patient/family education    PT Goals (Current goals can be found in the Care Plan section)  Acute Rehab PT Goals Patient Stated Goal: pt unable to focus on a longer term goal PT Goal Formulation: Patient unable to participate in goal setting Time For Goal Achievement: 12/24/17 Potential to Achieve Goals: Good    Frequency Min 3X/week   Barriers to discharge        Co-evaluation               AM-PAC PT "6 Clicks" Daily Activity  Outcome Measure Difficulty turning over in bed (including adjusting bedclothes, sheets and blankets)?: Unable Difficulty moving from lying on back to sitting on the side of the bed? : Unable Difficulty sitting down on and standing up from a chair with arms (e.g., wheelchair, bedside commode, etc,.)?: Unable Help needed moving to and from a bed to chair (including a wheelchair)?: A Lot Help needed walking in hospital room?: Total Help needed climbing 3-5 steps with a railing? : Total 6 Click Score: 7    End of Session   Activity Tolerance: Patient tolerated treatment well Patient left: in bed;with call bell/phone within reach;with family/visitor present;with restraints reapplied Nurse Communication: Mobility status PT Visit Diagnosis: Hemiplegia and hemiparesis;Other abnormalities of gait and mobility (R26.89);Muscle weakness (generalized) (M62.81) Hemiplegia - Right/Left: Left Hemiplegia - dominant/non-dominant: Non-dominant Hemiplegia - caused by: Other Nontraumatic intracranial hemorrhage    Time: 5400-8676 PT Time Calculation (min) (ACUTE  ONLY): 45 min   Charges:   PT Evaluation $PT Eval High Complexity: 1 High PT Treatments $Therapeutic Activity: 8-22 mins $Neuromuscular Re-education: 8-22 mins   PT G Codes:        2017-12-15  Albert Stanley, PT [phone]  (pager)  Albert Stanley Albert Stanley December 15, 2017, 11:48 AM

## 2017-12-10 NOTE — Progress Notes (Signed)
Patient ID: Albert Stanley, male   DOB: 05/18/1946, 72 y.o.   MRN: [id number] BP (!) 122/57   Pulse 91   Temp 99.5 F (37.5 C)   Resp 20   Ht 5\' 9"  (1.753 m)   Wt 94.5 kg (208 lb 5.4 oz)   SpO2 100%   BMI 30.77 kg/m  Alert, following commands Moving all extremities perrl Improving each day. Will transfer off unit

## 2017-12-10 NOTE — Plan of Care (Signed)
  Clinical Measurements: Usual level of consciousness will be regained or maintained. 12/10/2017 0000 - Progressing by Irish Lack, RN   Skin Integrity: Demonstration of wound healing without infection will improve 12/10/2017 0000 - Progressing by Irish Lack, RN

## 2017-12-10 NOTE — Progress Notes (Signed)
Patient ID: Albert Stanley, male   DOB: 09-29-1946, 72 y.o.   MRN: [id number]  [facility] Progress Note   Assessment/ Plan:   1. Large R SDH following mechanical fall with no reported head trauma- status post craniotomy for evacuation of hematoma with left-sided residual hemiplegia noted.  Transiently on ventilator support/intubated for airway protection now remains extubated 48 hours out. 2. ESRD - continue hemodialysis on a Tuesday/Thursday/Saturday schedule via temporary left IJ hemodialysis catheter-we will order for his next hemodialysis tomorrow.  We will consult with vascular surgery later on during this hospitalization for conversion of his temporary catheter to a tunneled line as well as evaluation for permanent access versus the possibility to salvage current access. 3. Labile blood pressures: With episodes of intermittent hypotension that have limited ultrafiltration for management of his lymphedema and likely have contributed to his recent left brachiocephalic fistula thrombosis.  4. Acute blood loss anemia on chronic anemia of ESRD: Hemoglobin/hematocrit stable status post PRBC transfusion-difficult to get him blood because of several cross-reactive antibodies 5. Secondary Hyperparathyroidism -hyperphosphatemic with previous pattern of suboptimal adherence to renal diet/binders-we will start binders when appropriately eating and drinking.  6. Nutrition - currently nothing by mouth as his mental status poses a risk to aspiration. 7. Systolic/diastolic HF -continue to hold antihypertensive therapy due to low blood pressures 8. H/o DVT 9. Lymphedema   Subjective:   Reports discomfort in his left knee and upset that he has a restraint on.    Objective:   BP 134/85   Pulse 96   Temp 99.5 F (37.5 C)   Resp (!) 25   Ht 5\' 9"  (1.753 m)   Wt 94.5 kg (208 lb 5.4 oz)   SpO2 98%   BMI 30.77 kg/m   Physical Exam: Gen: Patient awake, alert and working with physical  therapy CVS: Pulse regular rhythm, normal rate, S1 and S2 normal Resp: Anteriorly clear to auscultation, no rales/rhonchi, Left IJ temporary HD catheter Abd: Soft, obese, nontender Ext: Chronic lymphedema of the lower extremities, 1+ left upper extremity edema. Left brachiocephalic fistula with pulsatile without any audible bruit  Labs: BMET Recent Labs  Lab 12/05/17 1626 12/06/17 0256 12/07/17 0516 12/07/17 1729 12/08/17 0334 12/09/17 0430 12/10/17 0517  NA 138 136 138 139 133* 132* 136  K 5.3* 5.3* 6.2* 5.6* 5.1 5.9* 5.2*  CL 97* 99* 102 103 97* 95* 97*  CO2 19* 19* 17* 20* 21* 21* 26  GLUCOSE 105* 92 113* 91 170* 165* 112*  BUN 69* 70* 82* 67* 45* 61* 43*  CREATININE 9.39* 9.37* 10.35* 8.32* 6.87* 8.23* 5.54*  CALCIUM 8.6* 7.7* 8.0* 7.5* 7.5* 7.7* 8.2*  PHOS  --  8.8*  --   --   --   --   --    CBC Recent Labs  Lab 12/07/17 0516 12/07/17 1729 12/08/17 0334 12/09/17 0430 12/10/17 0517  WBC 13.0* 11.7* 10.1 13.6* 11.2*  NEUTROABS 11.3*  --  8.5* 11.6* 9.4*  HGB 7.2* 7.3* 7.0* 7.4* 7.4*  HCT 20.9* 20.9* 20.1* 22.2* 22.0*  MCV 89.7 88.6 88.9 89.2 90.9  PLT 135* 113* 93* 146* 170   Medications:    . Chlorhexidine Gluconate Cloth  6 each Topical Q2200  . feeding supplement (NEPRO CARB STEADY)  1,000 mL Per Tube Q24H  . feeding supplement (PRO-STAT SUGAR FREE 64)  30 mL Per Tube BID  . insulin aspart  2-6 Units Subcutaneous TID WC  . mouth rinse  15 mL Mouth Rinse  BID  . mupirocin ointment   Nasal BID  . ondansetron (ZOFRAN) IV  4 mg Intravenous Once  . oxybutynin  5 mg Oral BID  . pantoprazole sodium  40 mg Per Tube QHS  . polyethylene glycol  17 g Oral Daily  . sodium chloride flush  10-40 mL Intracatheter Q12H   Elmarie Shiley, MD 12/10/2017, 11:06 AM

## 2017-12-11 LAB — COMPREHENSIVE METABOLIC PANEL
ALBUMIN: 2.8 g/dL — AB (ref 3.5–5.0)
ALT: 488 U/L — ABNORMAL HIGH (ref 17–63)
ANION GAP: 18 — AB (ref 5–15)
AST: 265 U/L — AB (ref 15–41)
Alkaline Phosphatase: 140 U/L — ABNORMAL HIGH (ref 38–126)
BUN: 67 mg/dL — AB (ref 6–20)
CHLORIDE: 92 mmol/L — AB (ref 101–111)
CO2: 23 mmol/L (ref 22–32)
Calcium: 8.5 mg/dL — ABNORMAL LOW (ref 8.9–10.3)
Creatinine, Ser: 6.71 mg/dL — ABNORMAL HIGH (ref 0.61–1.24)
GFR calc Af Amer: 8 mL/min — ABNORMAL LOW (ref >60)
GFR calc non Af Amer: 7 mL/min — ABNORMAL LOW (ref >60)
GLUCOSE: 176 mg/dL — AB (ref 65–99)
Potassium: 4.4 mmol/L (ref 3.5–5.1)
Sodium: 133 mmol/L — ABNORMAL LOW (ref 135–145)
TOTAL PROTEIN: 6 g/dL — AB (ref 6.5–8.1)
Total Bilirubin: 1.6 mg/dL — ABNORMAL HIGH (ref 0.3–1.2)

## 2017-12-11 LAB — CBC WITH DIFFERENTIAL/PLATELET
BASOS ABS: 0 10*3/uL (ref 0.0–0.1)
Basophils Relative: 0 %
EOS PCT: 2 %
Eosinophils Absolute: 0.1 10*3/uL (ref 0.0–0.7)
HEMATOCRIT: 22.4 % — AB (ref 39.0–52.0)
Hemoglobin: 7.4 g/dL — ABNORMAL LOW (ref 13.0–17.0)
LYMPHS ABS: 0.3 10*3/uL — AB (ref 0.7–4.0)
LYMPHS PCT: 4 %
MCH: 29.8 pg (ref 26.0–34.0)
MCHC: 33 g/dL (ref 30.0–36.0)
MCV: 90.3 fL (ref 78.0–100.0)
Monocytes Absolute: 1.2 10*3/uL — ABNORMAL HIGH (ref 0.1–1.0)
Monocytes Relative: 15 %
NEUTROS ABS: 6.6 10*3/uL (ref 1.7–7.7)
Neutrophils Relative %: 79 %
Platelets: 154 10*3/uL (ref 150–400)
RBC: 2.48 MIL/uL — AB (ref 4.22–5.81)
RDW: 16.9 % — ABNORMAL HIGH (ref 11.5–15.5)
WBC: 8.2 10*3/uL (ref 4.0–10.5)

## 2017-12-11 LAB — GLUCOSE, CAPILLARY
GLUCOSE-CAPILLARY: 146 mg/dL — AB (ref 65–99)
GLUCOSE-CAPILLARY: 166 mg/dL — AB (ref 65–99)
Glucose-Capillary: 134 mg/dL — ABNORMAL HIGH (ref 65–99)
Glucose-Capillary: 150 mg/dL — ABNORMAL HIGH (ref 65–99)

## 2017-12-11 LAB — APTT: aPTT: 37 seconds — ABNORMAL HIGH (ref 24–36)

## 2017-12-11 LAB — MAGNESIUM: Magnesium: 2.4 mg/dL (ref 1.7–2.4)

## 2017-12-11 MED ORDER — ACETAMINOPHEN 325 MG PO TABS
ORAL_TABLET | ORAL | Status: AC
  Filled 2017-12-11: qty 2

## 2017-12-11 MED ORDER — DIPHENHYDRAMINE HCL 25 MG PO CAPS
25.0000 mg | ORAL_CAPSULE | Freq: Four times a day (QID) | ORAL | Status: DC | PRN
  Administered 2017-12-11 – 2017-12-16 (×12): 25 mg via ORAL
  Filled 2017-12-11 (×12): qty 1

## 2017-12-11 NOTE — Progress Notes (Signed)
PT back from HD

## 2017-12-11 NOTE — Progress Notes (Signed)
Arrived from 4N at 2200. Alert and oriented. Soft waist belt in placed. Wife at bedside. Foley in placed with blood output. Oriented to room.

## 2017-12-11 NOTE — Procedures (Signed)
Patient seen on Hemodialysis. QB 350, UF goal 2.5L Treatment adjusted as needed.  Elmarie Shiley MD [facility]. Office # [phone] Pager # [phone] 8:34 AM

## 2017-12-11 NOTE — Consult Note (Signed)
PROGRESS NOTE    Albert Stanley  QIW:[id number] DOB: March 18, 1946 DOA: 12/05/2017 PCP: [name], MD    Brief Narrative:  72 y.o. male. 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  Past medical history significant for lymphedema, NICM with EF 40%, GERD, h/o DVT not on anticoagulation, h/o renal mass/atypical cyst and bladder Ca s/p resection in 1999.  Patient was admitted for further management of acute right panhemispheric subdural hematoma  Assessment & Plan:   Active Problems:   S/P craniotomy - Neurosurgery managing    Endotracheally intubated - resolved currently sating 100 percent on room air    SDH (subdural hematoma) ([name]) -  S/p craniotomy for hematoma evacuation  DM - continue current regimen. Blood sugars well controlled.    Chronic renal impairment - Nephrology on board and plans are to find permanent access - nephro to consult Vascular surgery for conversion of a tunneled HD catheter to permit an option.    Transaminitis  Npo - 2ary to mentation patient poses risk for aspiration as such he is nothing by mouth - currently has NG tube which he has had for 3 days. Getting nutrition via that route.  DVT prophylaxis: scd's Code Status: Full Family Communication: None at bedside Disposition Plan: Per primary    Procedures: Craniotomy  Subjective: The patient has no new complaints currently. He wanted applesauce  Objective: Vitals:   12/11/17 1100 12/11/17 1130 12/11/17 1148 12/11/17 1231  BP: (!) 145/82 130/76 122/74 122/75  Pulse: 85 85 65 79  Resp: 14 12 18 18   Temp:   97.8 F (36.6 C) 98.9 F (37.2 C)  TempSrc:   Oral Oral  SpO2: 100% 100% 100% 100%  Weight:   92 kg (202 lb 13.2 oz)   Height:        Intake/Output Summary (Last 24 hours) at 12/11/2017 1413 Last data filed at 12/11/2017 1148 Gross per 24 hour  Intake 190 ml  Output 2520 ml  Net -2330 ml   Filed Weights   12/09/17 1120 12/11/17 0740 12/11/17 1148    Weight: 94.5 kg (208 lb 5.4 oz) 94.5 kg (208 lb 5.4 oz) 92 kg (202 lb 13.2 oz)    Examination:  General exam: Appears calm and comfortable, no acute distress Respiratory system: Clear to auscultation. Respiratory effort normal. Equal chest rise. Cardiovascular system: S1 & S2 heard, RRR. No JVD, murmurs, rubs Gastrointestinal system: Abdomen is nondistended, soft and nontender. No organomegaly or masses felt. Normal bowel sounds heard. Central nervous system: Alert and awake. Responds verbally to questions Extremities: warm, no cyanosis Skin: No rashes, lesions or ulcers, on limited exam. Psychiatry:  Mood & affect appropriate.     Data Reviewed: I have personally reviewed following labs and imaging studies  CBC: Recent Labs  Lab 12/07/17 0516 12/07/17 1729 12/08/17 0334 12/09/17 0430 12/10/17 0517 12/11/17 0352  WBC 13.0* 11.7* 10.1 13.6* 11.2* 8.2  NEUTROABS 11.3*  --  8.5* 11.6* 9.4* 6.6  HGB 7.2* 7.3* 7.0* 7.4* 7.4* 7.4*  HCT 20.9* 20.9* 20.1* 22.2* 22.0* 22.4*  MCV 89.7 88.6 88.9 89.2 90.9 90.3  PLT 135* 113* 93* 146* 170 417   Basic Metabolic Panel: Recent Labs  Lab 12/06/17 0256 12/07/17 0516 12/07/17 1729 12/08/17 0334 12/09/17 0430 12/10/17 0517 12/11/17 0352  NA 136 138 139 133* 132* 136 133*  K 5.3* 6.2* 5.6* 5.1 5.9* 5.2* 4.4  CL 99* 102 103 97* 95* 97* 92*  CO2 19*  17* 20* 21* 21* 26 23  GLUCOSE 92 113* 91 170* 165* 112* 176*  BUN 70* 82* 67* 45* 61* 43* 67*  CREATININE 9.37* 10.35* 8.32* 6.87* 8.23* 5.54* 6.71*  CALCIUM 7.7* 8.0* 7.5* 7.5* 7.7* 8.2* 8.5*  MG 2.4 2.5*  --  2.1 2.3 2.3 2.4  PHOS 8.8*  --   --   --   --   --   --    GFR: Estimated Creatinine Clearance: 11.1 mL/min (A) (by C-G formula based on SCr of 6.71 mg/dL (H)). Liver Function Tests: Recent Labs  Lab 12/07/17 0516 12/08/17 0334 12/09/17 0430 12/10/17 0517 12/11/17 0352  AST 214* 615* 292* 337* 265*  ALT 167* 587* 504* 503* 488*  ALKPHOS 51 49 88 120 140*  BILITOT  0.9 1.2 1.2 1.8* 1.6*  PROT 5.9* 4.9* 5.3* 5.4* 6.0*  ALBUMIN 2.6* 2.4* 2.5* 2.5* 2.8*   No results for input(s): LIPASE, AMYLASE in the last 168 hours. No results for input(s): AMMONIA in the last 168 hours. Coagulation Profile: Recent Labs  Lab 12/05/17 1626 12/07/17 0516 12/08/17 0334 12/09/17 0430  INR 1.38 1.59 1.69 1.72   Cardiac Enzymes: Recent Labs  Lab 12/05/17 2349  TROPONINI 0.57*   BNP (last 3 results) No results for input(s): PROBNP in the last 8760 hours. HbA1C: No results for input(s): HGBA1C in the last 72 hours. CBG: Recent Labs  Lab 12/10/17 1148 12/10/17 1525 12/10/17 2002 12/11/17 0059 12/11/17 0448  GLUCAP 146* 74 120* 150* 146*   Lipid Profile: No results for input(s): CHOL, HDL, LDLCALC, TRIG, CHOLHDL, LDLDIRECT in the last 72 hours. Thyroid Function Tests: No results for input(s): TSH, T4TOTAL, FREET4, T3FREE, THYROIDAB in the last 72 hours. Anemia Panel: No results for input(s): VITAMINB12, FOLATE, FERRITIN, TIBC, IRON, RETICCTPCT in the last 72 hours. Sepsis Labs: No results for input(s): PROCALCITON, LATICACIDVEN in the last 168 hours.  Recent Results (from the past 240 hour(s))  MRSA PCR Screening     Status: Abnormal   Collection Time: 12/05/17  7:10 PM  Result Value Ref Range Status   MRSA by PCR POSITIVE (A) NEGATIVE Final    Comment:        The GeneXpert MRSA Assay (FDA approved for NASAL specimens only), is one component of a comprehensive MRSA colonization surveillance program. It is not intended to diagnose MRSA infection nor to guide or monitor treatment for MRSA infections. RESULT CALLED TO, READ BACK BY AND VERIFIED WITH: PRIDGEN,T RN 12/05/17 2222 SKEEN,P      Radiology Studies: No results found.  Scheduled Meds: . Chlorhexidine Gluconate Cloth  6 each Topical Q2200  . feeding supplement (NEPRO CARB STEADY)  1,000 mL Per Tube Q24H  . feeding supplement (PRO-STAT SUGAR FREE 64)  30 mL Per Tube BID  . insulin  aspart  2-6 Units Subcutaneous TID WC  . mouth rinse  15 mL Mouth Rinse BID  . mupirocin ointment   Nasal BID  . ondansetron (ZOFRAN) IV  4 mg Intravenous Once  . oxybutynin  5 mg Oral BID  . pantoprazole sodium  40 mg Per Tube QHS  . polyethylene glycol  17 g Oral Daily  . QUEtiapine  25 mg Oral BID  . sodium chloride flush  10-40 mL Intracatheter Q12H   Continuous Infusions: . sodium chloride Stopped (12/08/17 0939)  . dextrose Stopped (12/08/17 1554)     LOS: 6 days    Time spent: > 20 minutes  Velvet Bathe, MD [facility] Pager [phone]  If 7PM-7AM, please contact night-coverage [email or web address] Password [facility] 12/11/2017, 2:13 PM

## 2017-12-11 NOTE — Progress Notes (Signed)
OT cancellation     12/11/17 0853  OT Visit Information  Last OT Received On 12/11/17  Reason Eval/Treat Not Completed Patient at procedure or test/ unavailable- Pt in HD.    Roseanne Reno, OTR/L

## 2017-12-11 NOTE — Progress Notes (Signed)
Patient ID: Albert Stanley, male   DOB: [birth year], 72 y.o.   MRN: [id number]  [facility] Progress Note   Assessment/ Plan:   1. Large R SDH following mechanical fall with no reported head trauma- status post craniotomy for evacuation of hematoma with left-sided residual hemiplegia noted.   2. ESRD - continue TTS HD via temporary left IJ hemodialysis catheter. Will consult with vascular surgery for conversion to tunneled HD catheter and alternate permanent access options.  3. Labile blood pressures: attempting 2.5L UF at dialysis today.  4. Acute blood loss anemia on chronic anemia of ESRD: Hemoglobin/hematocrit stable status post PRBC transfusion-difficult to get him blood because of several cross-reactive antibodies 5. Secondary Hyperparathyroidism -hyperphosphatemic with previous pattern of suboptimal adherence to renal diet/binders-we will start binders when appropriately eating and drinking.  6. Nutrition - currently nothing by mouth as his mental status poses a risk to aspiration. 7. Systolic/diastolic HF -continue to hold antihypertensive therapy due to low blood pressures 8. H/o DVT 9. Lymphedema - chronic and likely to benefit from ACE wraps  Subjective:   Reports some pain in his back and left knee. Denies chest pain and shortness of breath. Some episodic confusion noted.    Objective:   BP 121/79 (BP Location: Right Arm)   Pulse 79   Temp 98.1 F (36.7 C) (Axillary)   Resp 18   Ht 5\' 9"  (1.753 m)   Wt 94.5 kg (208 lb 5.4 oz)   SpO2 100%   BMI 30.77 kg/m   Physical Exam: Gen: Patient resting comfortably on dialysis CVS: Pulse regular rhythm, normal rate, S1 and S2 normal Resp: Anteriorly clear to auscultation, no rales/rhonchi, Left IJ temporary HD catheter Abd: Soft, obese, nontender Ext: Chronic lymphedema of the lower extremities, 1+ left upper extremity edema. Left brachiocephalic fistula pulsatile at the inflow only without any audible  bruit  Labs: BMET Recent Labs  Lab 12/06/17 0256 12/07/17 0516 12/07/17 1729 12/08/17 0334 12/09/17 0430 12/10/17 0517 12/11/17 0352  NA 136 138 139 133* 132* 136 133*  K 5.3* 6.2* 5.6* 5.1 5.9* 5.2* 4.4  CL 99* 102 103 97* 95* 97* 92*  CO2 19* 17* 20* 21* 21* 26 23  GLUCOSE 92 113* 91 170* 165* 112* 176*  BUN 70* 82* 67* 45* 61* 43* 67*  CREATININE 9.37* 10.35* 8.32* 6.87* 8.23* 5.54* 6.71*  CALCIUM 7.7* 8.0* 7.5* 7.5* 7.7* 8.2* 8.5*  PHOS 8.8*  --   --   --   --   --   --    CBC Recent Labs  Lab 12/08/17 0334 12/09/17 0430 12/10/17 0517 12/11/17 0352  WBC 10.1 13.6* 11.2* 8.2  NEUTROABS 8.5* 11.6* 9.4* 6.6  HGB 7.0* 7.4* 7.4* 7.4*  HCT 20.1* 22.2* 22.0* 22.4*  MCV 88.9 89.2 90.9 90.3  PLT 93* 146* 170 154   Medications:    . Chlorhexidine Gluconate Cloth  6 each Topical Q2200  . feeding supplement (NEPRO CARB STEADY)  1,000 mL Per Tube Q24H  . feeding supplement (PRO-STAT SUGAR FREE 64)  30 mL Per Tube BID  . insulin aspart  2-6 Units Subcutaneous TID WC  . mouth rinse  15 mL Mouth Rinse BID  . mupirocin ointment   Nasal BID  . ondansetron (ZOFRAN) IV  4 mg Intravenous Once  . oxybutynin  5 mg Oral BID  . pantoprazole sodium  40 mg Per Tube QHS  . polyethylene glycol  17 g Oral Daily  . QUEtiapine  25 mg  Oral BID  . sodium chloride flush  10-40 mL Intracatheter Q12H   Elmarie Shiley, MD 12/11/2017, 8:25 AM

## 2017-12-11 NOTE — Progress Notes (Signed)
Pt was forgetful stated "get that things off my leg" when SCD was already off; "my wife spend the night here." " I haven't gotten my medicine" when pt just had received it.  Administered percocet for pain and haldol with restlessness, but pt was still up the whole night. Pt pulled out IV. Placed mitten on.

## 2017-12-12 DIAGNOSIS — N186 End stage renal disease: Secondary | ICD-10-CM

## 2017-12-12 DIAGNOSIS — T82868A Thrombosis of vascular prosthetic devices, implants and grafts, initial encounter: Secondary | ICD-10-CM

## 2017-12-12 LAB — GLUCOSE, CAPILLARY
GLUCOSE-CAPILLARY: 189 mg/dL — AB (ref 65–99)
GLUCOSE-CAPILLARY: 188 mg/dL — AB (ref 65–99)
Glucose-Capillary: 125 mg/dL — ABNORMAL HIGH (ref 65–99)
Glucose-Capillary: 121 mg/dL — ABNORMAL HIGH (ref 65–99)

## 2017-12-12 LAB — RENAL FUNCTION PANEL
ALBUMIN: 2.6 g/dL — AB (ref 3.5–5.0)
ANION GAP: 14 (ref 5–15)
BUN: 45 mg/dL — ABNORMAL HIGH (ref 6–20)
CO2: 25 mmol/L (ref 22–32)
Calcium: 8.3 mg/dL — ABNORMAL LOW (ref 8.9–10.3)
Chloride: 95 mmol/L — ABNORMAL LOW (ref 101–111)
Creatinine, Ser: 4.79 mg/dL — ABNORMAL HIGH (ref 0.61–1.24)
GFR calc Af Amer: 13 mL/min — ABNORMAL LOW (ref >60)
GFR, EST NON AFRICAN AMERICAN: 11 mL/min — AB (ref >60)
Glucose, Bld: 97 mg/dL (ref 65–99)
PHOSPHORUS: 5 mg/dL — AB (ref 2.5–4.6)
POTASSIUM: 3.6 mmol/L (ref 3.5–5.1)
Sodium: 134 mmol/L — ABNORMAL LOW (ref 135–145)

## 2017-12-12 NOTE — Progress Notes (Signed)
Patient was witnessed adjusting in bed towards siderail facing patient's room door. I had just finished giving medication to the patient in room 3W39 when pt in 3W30's bed alarm went off. I responded to the alarm and witnessed patient had slipped out of bed and was now resting on his knees on the bottom left corner of the bed with his upper body still resting on the bed. When asked what the patient was trying to do, the patient responded stating felt like he needed to go to the bathroom. Patient was assessed for any extra injury including the head but none was noted. The patient was also very verbal and able to respond at appropriate times. I hit the call button to ask for help when Jonelle Sidle, the NT, came by. She got gowned up and began to help see if able to get patient back into bed. I was able to get Charge Nurse, Loistine Simas, notified who happened to have the patient's PA with her when coming to assist in the room. Using the gait belt, we were able to get the patient back into the bed where he was assessed for any injuries and readjusted to a comfortable position. Not long after incident, patient's wife called where she was informed of the situation by the RN.

## 2017-12-12 NOTE — Progress Notes (Signed)
Patient ID: Albert Stanley, male   DOB: November 22, 1945, 72 y.o.   MRN: [id number]  [facility] Progress Note   Assessment/ Plan:   1. Large R SDH following mechanical fall with no reported head trauma- status post craniotomy for evacuation of hematoma with left-sided residual hemiparesis.   2. ESRD - continue TTS HD via temporary left IJ hemodialysis catheter.  He underwent hemodialysis yesterday without problems and no acute indications noted today, next hemodialysis due Tuesday 3/5.  We will consult vascular surgery for permanent access planning and conversion to [facility]. 3. Hypertension: Previously with labile blood pressures and prominent hypotension that contributed to left RCF thrombosis.  Blood pressures currently under good control. 4. Acute blood loss anemia on chronic anemia of ESRD: Hemoglobin/hematocrit stable status post PRBC transfusion-he has several cross-reactive antibodies and will plan for PRBC transfusion again with hemodialysis on Tuesday. 5. Secondary Hyperparathyroidism -we will follow up on phosphorus level to decide on need for restarting binder (currently on tube feeds).  6. Nutrition -currently with NG tube in situ and getting tube feeds. 7. Systolic/diastolic HF -continue to hold antihypertensive therapy due to low blood pressures 8. H/o DVT 9. Lymphedema - chronic and likely to benefit from ACE wraps  Subjective:   Had an unwitnessed fall earlier today in his room-found kneeling on the floor with head/shoulders over the bed-he states that he was trying to get to the bathroom.    Objective:   BP 127/77 (BP Location: Right Arm)   Pulse 89   Temp 98.6 F (37 C) (Oral)   Resp 17   Ht 5\' 9"  (1.753 m)   Wt 95.3 kg (210 lb 1.6 oz)   SpO2 99%   BMI 31.03 kg/m   Physical Exam: Gen: Patient appears comfortable just repositioned from falling in room CVS: Pulse regular rhythm, normal rate, S1 and S2 normal Resp: Anteriorly clear to auscultation, no  rales/rhonchi, Left IJ temporary HD catheter Abd: Soft, obese, nontender Ext: Chronic lymphedema of the lower extremities, 1+ left upper extremity edema. Left brachiocephalic fistula pulsatile at the inflow only without any audible bruit  Labs: BMET Recent Labs  Lab 12/06/17 0256 12/07/17 0516 12/07/17 1729 12/08/17 0334 12/09/17 0430 12/10/17 0517 12/11/17 0352  NA 136 138 139 133* 132* 136 133*  K 5.3* 6.2* 5.6* 5.1 5.9* 5.2* 4.4  CL 99* 102 103 97* 95* 97* 92*  CO2 19* 17* 20* 21* 21* 26 23  GLUCOSE 92 113* 91 170* 165* 112* 176*  BUN 70* 82* 67* 45* 61* 43* 67*  CREATININE 9.37* 10.35* 8.32* 6.87* 8.23* 5.54* 6.71*  CALCIUM 7.7* 8.0* 7.5* 7.5* 7.7* 8.2* 8.5*  PHOS 8.8*  --   --   --   --   --   --    CBC Recent Labs  Lab 12/08/17 0334 12/09/17 0430 12/10/17 0517 12/11/17 0352  WBC 10.1 13.6* 11.2* 8.2  NEUTROABS 8.5* 11.6* 9.4* 6.6  HGB 7.0* 7.4* 7.4* 7.4*  HCT 20.1* 22.2* 22.0* 22.4*  MCV 88.9 89.2 90.9 90.3  PLT 93* 146* 170 154   Medications:    . Chlorhexidine Gluconate Cloth  6 each Topical Q2200  . feeding supplement (NEPRO CARB STEADY)  1,000 mL Per Tube Q24H  . feeding supplement (PRO-STAT SUGAR FREE 64)  30 mL Per Tube BID  . insulin aspart  2-6 Units Subcutaneous TID WC  . mouth rinse  15 mL Mouth Rinse BID  . mupirocin ointment   Nasal BID  . ondansetron (  ZOFRAN) IV  4 mg Intravenous Once  . oxybutynin  5 mg Oral BID  . pantoprazole sodium  40 mg Per Tube QHS  . polyethylene glycol  17 g Oral Daily  . QUEtiapine  25 mg Oral BID  . sodium chloride flush  10-40 mL Intracatheter Q12H   Elmarie Shiley, MD 12/12/2017, 8:38 AM

## 2017-12-12 NOTE — Progress Notes (Signed)
  Speech Language Pathology Treatment: Dysphagia  Patient Details Name: Albert Stanley MRN: [id number] DOB: 1946/10/09 Today's Date: 12/12/2017 Time: 1440-1500 SLP Time Calculation (min) (ACUTE ONLY): 20 min  Assessment / Plan / Recommendation Clinical Impression  Skilled treatment session focused on dysphagia goals. SLP facilitated session by providing skilled observation of pt consuming puree and thin liquids via straw. Pt with swift oral phase and appear of swift swallow initiation. No oral residue observed and no overt s/s of aspiration with thin liquids via straw. Of note, pt's voice doesn't appear as hoarse as previously charted. Therefore recommend upgrade to dysphagia 1 with thin liquids, full supervision and staff to assist with feeding. Pt is able to converse with SLP even though eyes are closed. Pt reports that it is too bright to open eyes (blinds closed). ST to follow.    HPI HPI: Fall with intracranial hemorrhage subdural hematoma, s/p craniotomy      SLP Plan  Continue with current plan of care       Recommendations  Diet recommendations: Dysphagia 1 (puree);Thin liquid Liquids provided via: Straw Medication Administration: Crushed with puree Supervision: Staff to assist with self feeding;Full supervision/cueing for compensatory strategies Compensations: Slow rate;Small sips/bites Postural Changes and/or Swallow Maneuvers: Seated upright 90 degrees                General recommendations: Rehab consult Oral Care Recommendations: Oral care QID Follow up Recommendations: Inpatient Rehab SLP Visit Diagnosis: Dysphagia, unspecified (R13.10) Plan: Continue with current plan of care       [place] 12/12/2017, 3:02 PM

## 2017-12-12 NOTE — Progress Notes (Signed)
Subjective: Patient reports doing OK  Objective: Vital signs in last 24 hours: Temp:  [98 F (36.7 C)-98.9 F (37.2 C)] 98.6 F (37 C) (03/03 0835) Pulse Rate:  [75-89] 89 (03/03 0835) Resp:  [17-18] 17 (03/03 0835) BP: (120-135)/(67-90) 127/77 (03/03 0835) SpO2:  [96 %-100 %] 99 % (03/03 0835) Weight:  [95.3 kg (210 lb 1.6 oz)] 95.3 kg (210 lb 1.6 oz) (03/03 0500)  Intake/Output from previous day: 03/02 0701 - 03/03 0700 In: -  Out: 2500  Intake/Output this shift: No intake/output data recorded.  Physical Exam: Awake, conversant.  F/C.  Craniotomy incision CDI.  Lab Results: Recent Labs    12/10/17 0517 12/11/17 0352  WBC 11.2* 8.2  HGB 7.4* 7.4*  HCT 22.0* 22.4*  PLT 170 154   BMET Recent Labs    12/11/17 0352 12/12/17 0931  NA 133* 134*  K 4.4 3.6  CL 92* 95*  CO2 23 25  GLUCOSE 176* 97  BUN 67* 45*  CREATININE 6.71* 4.79*  CALCIUM 8.5* 8.3*    Studies/Results: No results found.  Assessment/Plan: Patient is stable.  Awaiting placement.    LOS: 7 days    Peggyann Shoals, MD 12/12/2017, 12:30 PM

## 2017-12-12 NOTE — Consult Note (Addendum)
Established Dialysis Access   History of Present Illness   Albert Stanley is a 72 y.o. (03-Jul-1946) male who presents for re-evaluation for permanent access.  History is obtained from the chart as the patient somewhat obtunded.  This patient has previously had a L BC AVF placed by Dr Scot Dock.  He also required multiple secondary procedures included revisions for PSA and venoplasties.  This patient was admitted for a fall leading to a SDH.  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  His L BC AVF thrombosed in the recovery period of his craniotomy and a temporary dialysis catheter was placed.     Past Medical History:  Diagnosis Date  . Arthritis    Osteoarthritis  . Asthma   . Bladder cancer ([place]) dx'd 1990   surg only  . CHF (congestive heart failure) ([place])   . Diabetes mellitus without complication ([place])   . ESRD (end stage renal disease) ([name])    Tues, Thurs, Sat dialysis  . GERD (gastroesophageal reflux disease)   . History of cardiac catheterization    a. LHC 5/17: no obstructive CAD  . History of DVT (deep vein thrombosis)    2003 ish  . History of nuclear stress test    a. Myoview 4/17: EF 39%, inf, inf-lat, apical inf, apical lat, apical scar, intermediate risk  . Hyperlipidemia   . Hypertension   . NICM (nonischemic cardiomyopathy) ([place])    a. Echo 2/17: mod LVH, EF 35-40%, inf-lat and inf-sept HK, mod MR, severe LAE, small pericardial effusion    Past Surgical History:  Procedure Laterality Date  . arthroscopic knee surgery Left   . AV FISTULA PLACEMENT Left 12/13/2015   Procedure: ARTERIOVENOUS (AV) FISTULA CREATION;  Surgeon: Angelia Mould, MD;  Location: [place];  Service: Vascular;  Laterality: Left;  . BACK SURGERY     2 times  1960  . CARDIAC CATHETERIZATION N/A 02/28/2016   Procedure: Left Heart Cath and Coronary Angiography;  Surgeon: Larey Dresser, MD;  Location: [facility];  Service:  Cardiovascular;  Laterality: N/A;  . CRANIOTOMY N/A 12/05/2017   Procedure: CRANIOTOMY HEMATOMA EVACUATION SUBDURAL;  Surgeon: Ashok Pall, MD;  Location: [place];  Service: Neurosurgery;  Laterality: N/A;  . CYSTOSCOPY    . FISTULA SUPERFICIALIZATION Left 08/14/2016   Procedure: FISTULA SUPERFICIALIZATION LEFT UPPER ARM;  Surgeon: Angelia Mould, MD;  Location: [place];  Service: Vascular;  Laterality: Left;  . INSERTION OF DIALYSIS CATHETER N/A 12/13/2015   Procedure: INSERTION OF DIALYSIS CATHETER;  Surgeon: Angelia Mould, MD;  Location: [place];  Service: Vascular;  Laterality: N/A;  . INSERTION OF DIALYSIS CATHETER Left 05/07/2017   Procedure: INSERTION OF DIALYSIS CATHETER LEFT INTERNAL JUGULAR PLACEMENT;  Surgeon: Angelia Mould, MD;  Location: [place];  Service: Vascular;  Laterality: Left;  . IR RADIOLOGIST EVAL & MGMT  05/12/2017  . JOINT REPLACEMENT Left    hip  . LIGATION OF COMPETING BRANCHES OF ARTERIOVENOUS FISTULA Left 08/14/2016   Procedure: LIGATION OF COMPETING BRANCHES OF ARTERIOVENOUS FISTULA LEFT UPPER ARM;  Surgeon: Angelia Mould, MD;  Location: [place];  Service: Vascular;  Laterality: Left;  . PERIPHERAL VASCULAR CATHETERIZATION Left 06/09/2016   Procedure: Fistulagram;  Surgeon: Serafina Mitchell, MD;  Location: [place] [facility];  Service: Cardiovascular;  Laterality: Left;  ARM  . PERIPHERAL VASCULAR CATHETERIZATION Left 06/09/2016   Procedure: Peripheral Vascular Balloon Angioplasty;  Surgeon: Serafina Mitchell, MD;  Location: [place] [facility];  Service: Cardiovascular;  Laterality: Left;  upper arm venous  . PERIPHERAL VASCULAR CATHETERIZATION Left 06/25/2016   Procedure: Fistulagram;  Surgeon: Serafina Mitchell, MD;  Location: [place] [facility];  Service: Cardiovascular;  Laterality: Left;  . REVISON OF ARTERIOVENOUS FISTULA Left 05/07/2017   Procedure: REPAIR OF PSEUDOANEURYSM  LEFT BRACHIO-CEPHALIC  ARM ARTERIOVENOUS FISTULA;  Surgeon: Angelia Mould, MD;  Location: [place];  Service: Vascular;  Laterality: Left;    Social History   Socioeconomic History  . Marital status: Married    Spouse name: Not on file  . Number of children: Not on file  . Years of education: Not on file  . Highest education level: Not on file  Social Needs  . Financial resource strain: Not on file  . Food insecurity - worry: Not on file  . Food insecurity - inability: Not on file  . Transportation needs - medical: Not on file  . Transportation needs - non-medical: Not on file  Occupational History  . Occupation: Retired  Tobacco Use  . Smoking status: Former Smoker    Years: 30.00    Last attempt to quit: 10/12/1998    Years since quitting: 19.1  . Smokeless tobacco: Never Used  Substance and Sexual Activity  . Alcohol use: No    Alcohol/week: 0.0 oz    Comment: Used to drink on the weekends, quit 2 years ago  . Drug use: No  . Sexual activity: No  Other Topics Concern  . Not on file  Social History Narrative   Lives with wife and daughter.    Family History  Problem Relation Age of Onset  . Arthritis Mother   . Cancer Father   . Heart disease Father   . Hypertension Sister   . Alcohol abuse Brother   . Diabetes Daughter   . Heart attack Paternal Uncle     Current Facility-Administered Medications  Medication Dose Route Frequency Provider Last Rate Last Dose  . 0.9 %  sodium chloride infusion  250 mL Intravenous PRN Omar Person, NP   Stopped at 12/08/17 [phone]  . acetaminophen (TYLENOL) tablet 650 mg  650 mg Oral Q6H PRN Etheleen Nicks, MD   650 mg at 12/10/17 0856  . albuterol (PROVENTIL) (2.5 MG/3ML) 0.083% nebulizer solution 2.5 mg  2.5 mg Nebulization Q4H PRN Hammonds, Sharyn Blitz, MD      . Chlorhexidine Gluconate Cloth 2 % PADS 6 each  6 each Topical Q2200 Ashok Pall, MD   6 each at 12/11/17 2200  . dextrose 10 % infusion   Intravenous Continuous Etheleen Nicks, MD   Stopped at 12/08/17 1554  .  diphenhydrAMINE (BENADRYL) capsule 25 mg  25 mg Oral Q6H PRN Ashok Pall, MD   25 mg at 12/12/17 1210  . feeding supplement (NEPRO CARB STEADY) liquid 1,000 mL  1,000 mL Per Tube Q24H Etheleen Nicks, MD 45 mL/hr at 12/11/17 1738 1,000 mL at 12/11/17 1738  . feeding supplement (PRO-STAT SUGAR FREE 64) liquid 30 mL  30 mL Per Tube BID Etheleen Nicks, MD   30 mL at 12/12/17 1029  . haloperidol (HALDOL) tablet 5 mg  5 mg Oral Q8H PRN Etheleen Nicks, MD   5 mg at 12/11/17 2332   Or  . haloperidol lactate (HALDOL) injection 5 mg  5 mg Intravenous Q6H PRN Etheleen Nicks, MD      . insulin  aspart (novoLOG) injection 2-6 Units  2-6 Units Subcutaneous TID WC Etheleen Nicks, MD   4 Units at 12/12/17 1426  . MEDLINE mouth rinse  15 mL Mouth Rinse BID Etheleen Nicks, MD   15 mL at 12/12/17 1038  . mupirocin ointment (BACTROBAN) 2 %   Nasal BID Etheleen Nicks, MD      . MUSCLE RUB CREA   Topical PRN Etheleen Nicks, MD      . ondansetron [facility]) injection 4 mg  4 mg Intravenous Once Deno Etienne, DO      . oxybutynin (DITROPAN) 5 MG/5ML syrup 5 mg  5 mg Oral BID Etheleen Nicks, MD   5 mg at 12/12/17 1029  . oxyCODONE-acetaminophen (PERCOCET) 7.5-325 MG per tablet 1 tablet  1 tablet Oral Q6H PRN Etheleen Nicks, MD   1 tablet at 12/12/17 1210  . pantoprazole sodium (PROTONIX) 40 mg/20 mL oral suspension 40 mg  40 mg Per Tube QHS Ashok Pall, MD   40 mg at 12/11/17 2209  . polyethylene glycol (MIRALAX / GLYCOLAX) packet 17 g  17 g Oral Daily Etheleen Nicks, MD   17 g at 12/12/17 1029  . QUEtiapine (SEROQUEL) tablet 25 mg  25 mg Oral BID Etheleen Nicks, MD   25 mg at 12/12/17 1029  . sodium chloride flush (NS) 0.9 % injection 10-40 mL  10-40 mL Intracatheter Q12H Ashok Pall, MD   10 mL at 12/11/17 2209  . sodium chloride flush (NS) 0.9 % injection 10-40 mL  10-40 mL Intracatheter PRN Ashok Pall, MD         Allergies  Allergen Reactions  . Meperidine Shortness Of Breath and Other (See  Comments)    Increased heart rate Reaction to demerol  . Sulfa Antibiotics Shortness Of Breath and Other (See Comments)    Increased heart rate  . Sulfonamide Derivatives Shortness Of Breath and Other (See Comments)    Increased heart rate  . Eggs Or Egg-Derived Products Nausea And Vomiting     Reaction to egg yolks - not whites    . Foltx [Elfolate Plus] Other (See Comments)    Pt does not recall reaction to this  . Lac Bovis Diarrhea, Nausea And Vomiting and Other (See Comments)    Constipation Reaction to whole milk  . Lactose Intolerance (Gi) Diarrhea, Nausea And Vomiting and Other (See Comments)    Constipation Reaction to whole milk  . Milk-Related Compounds Diarrhea, Nausea And Vomiting and Other (See Comments)    Constipation Reaction to whole milk  . Penicillins Other (See Comments)    "Knocks me out."   Has patient had a PCN reaction causing immediate rash, facial/tongue/throat swelling, SOB or lightheadedness with hypotension: yes- blacked out Has patient had a PCN reaction causing severe rash involving mucus membranes or skin necrosis: unknown Has patient had a PCN reaction that required hospitalization: at MD office Has patient had a PCN reaction occurring within the last 10 years: no If all of the above answers are "NO", then may proceed with Cephalosporin use.   . Betadine [Povidone Iodine] Itching, Rash and Other (See Comments)    burning    REVIEW OF SYSTEMS (negative unless checked):   Cardiac:  []  Chest pain or chest pressure? []  Shortness of breath upon activity? []  Shortness of breath when lying flat? []  Irregular heart rhythm?  Vascular:  []  Pain in calf, thigh, or hip brought on by walking? []  Pain in feet at night  that wakes you up from your sleep? []  Blood clot in your veins? []  Leg swelling?  Pulmonary:  []  Oxygen at home? []  Productive cough? []  Wheezing?  Neurologic:  [x]  Sudden weakness in arms or legs? [x]  Sudden numbness in arms  or legs? [x]  Sudden onset of difficult speaking or slurred speech? []  Temporary loss of vision in one eye? []  Problems with dizziness?  Gastrointestinal:  []  Blood in stool? []  Vomited blood?  Genitourinary:  []  Burning when urinating? []  Blood in urine? [x]   End stage renal disease-HD: T/R/S  Psychiatric:  []  Major depression  Hematologic:  []  Bleeding problems? []  Problems with blood clotting?  Dermatologic:  []  Rashes or ulcers?  Constitutional:  []  Fever or chills?  Ear/Nose/Throat:  []  Change in hearing? []  Nose bleeds? []  Sore throat?  Musculoskeletal:  []  Back pain? []  Joint pain? []  Muscle pain?   Physical Examination   Vitals:   12/12/17 0447 12/12/17 0500 12/12/17 0835 12/12/17 1553  BP: 135/74  127/77 131/72  Pulse: 79  89 87  Resp: 18  17 18   Temp: 98 F (36.7 C)  98.6 F (37 C) 98.9 F (37.2 C)  TempSrc: Oral  Oral Oral  SpO2: 99%  99% 99%  Weight:  210 lb 1.6 oz (95.3 kg)    Height:       Body mass index is 31.03 kg/m.  General Somulent, could not maintain consciousness, Ill appearing, NGT in place, obvious staples in R scalp  Pulmonary Sym exp, good B air movt, CTA B, L IJV temporary dialysis catheter  Cardiac RRR, Nl S1, S2, no Murmurs, No rubs, No S3,S4  Vascular Vessel Right Left  Radial Faintly palpable Faintly palpable  Brachial Palpable Palpable  Ulnar Not palpable Not palpable    Musculo- skeletal M/S testing not possible due to AMS  , Extremities without ischemic changes, L BC AVF thrombosed without bruit or thrill  Neurologic Cannot test due to AMS    Laboratory   CBC CBC Latest Ref Rng & Units 12/11/2017 12/10/2017 12/09/2017  WBC 4.0 - 10.5 K/uL 8.2 11.2(H) 13.6(H)  Hemoglobin 13.0 - 17.0 g/dL 7.4(L) 7.4(L) 7.4(L)  Hematocrit 39.0 - 52.0 % 22.4(L) 22.0(L) 22.2(L)  Platelets 150 - 400 K/uL 154 170 146(L)    BMP BMP Latest Ref Rng & Units 12/12/2017 12/11/2017 12/10/2017  Glucose 65 - 99 mg/dL 97 176(H) 112(H)  BUN 6 - 20  mg/dL 45(H) 67(H) 43(H)  Creatinine 0.61 - 1.24 mg/dL 4.79(H) 6.71(H) 5.54(H)  Sodium 135 - 145 mmol/L 134(L) 133(L) 136  Potassium 3.5 - 5.1 mmol/L 3.6 4.4 5.2(H)  Chloride 101 - 111 mmol/L 95(L) 92(L) 97(L)  CO2 22 - 32 mmol/L 25 23 26   Calcium 8.9 - 10.3 mg/dL 8.3(L) 8.5(L) 8.2(L)    Coagulation Lab Results  Component Value Date   INR 1.72 12/09/2017   INR 1.69 12/08/2017   INR 1.59 12/07/2017   No results found for: PTT  Lipids    Component Value Date/Time   CHOL 91 02/14/2016 1030   TRIG 49 12/05/2017 1941   HDL 37 (L) 02/14/2016 1030   CHOLHDL 2.5 02/14/2016 1030   VLDL 9 02/14/2016 1030   LDLCALC 45 02/14/2016 1030    Medical Decision Making   Albert Stanley is a 73 y.o. male who presents with ESRD requiring hemodialysis, s/p craniotomy for R SDH, thrombosed L BC AVF   B vein mapping pending  Assuming the patient regains meaningful function, he will likely be right  hand dependent given the documented dense L hemiparesis, so would avoid any access in that arm unless no other options.  Based on functional status and family desires, I would probably attempt a LUA AVG placement rather than consider any staged transposition.  Discussed with family patient would undergo: Conversion of left temporary dialysis catheter to tunneled dialysis catheter, left upper arm arteriovenous graft   Due to lack of OR space Mon or Tues, at the earliest would consider proceeding on Wednesday.   Adele Barthel, MD, FACS [facility] Office: [phone] Pager: [phone]

## 2017-12-12 NOTE — Progress Notes (Addendum)
[facility] Progress Note  Presentation: 72 yo male presented to ED on 2/24 with c/o HA, back pain.  He fell and afterwards became confused and vomited twice.  On arrival to ED was intubated. CT showed SDH, taken to OR for emergent craniotomy and SDH evacuation.  Pt was in grave condition.    Subjective: no /co today  Vitals:   12/12/17 0500 12/12/17 0835 12/12/17 1553 12/12/17 1949  BP:  127/77 131/72 108/62  Pulse:  89 87 82  Resp:  17 18 20   Temp:  98.6 F (37 C) 98.9 F (37.2 C) 97.7 F (36.5 C)  TempSrc:  Oral Oral Axillary  SpO2:  99% 99% 99%  Weight: 95.3 kg (210 lb 1.6 oz)     Height:        Inpatient medications: . Chlorhexidine Gluconate Cloth  6 each Topical Q2200  . feeding supplement (NEPRO CARB STEADY)  1,000 mL Per Tube Q24H  . feeding supplement (PRO-STAT SUGAR FREE 64)  30 mL Per Tube BID  . insulin aspart  2-6 Units Subcutaneous TID WC  . mouth rinse  15 mL Mouth Rinse BID  . mupirocin ointment   Nasal BID  . ondansetron (ZOFRAN) IV  4 mg Intravenous Once  . oxybutynin  5 mg Oral BID  . pantoprazole sodium  40 mg Per Tube QHS  . polyethylene glycol  17 g Oral Daily  . QUEtiapine  25 mg Oral BID  . sodium chloride flush  10-40 mL Intracatheter Q12H   . sodium chloride Stopped (12/08/17 0939)  . dextrose Stopped (12/08/17 1554)   sodium chloride, acetaminophen, albuterol, diphenhydrAMINE, haloperidol **OR** haloperidol lactate, MUSCLE RUB, oxyCODONE-acetaminophen, sodium chloride flush  Exam: Facial edema, chronic ill appearing No jvd Chest cta bilat RRR no RG Abd obese soft ntnd Ext 2-3+ edema bilat LUE AVF no bruit, L IJ temp cath in place  Dialysis: TTS [facility] 4h  88.5kg  2/2.25 bath  P4   LUA AVF   Hep 7000 Venofer 50mg  IV qHD Mircera 140mcg IV q2wks - last 2/14 Calcitriol 3.1mcg PO qHD   Sensipar 30mg  qd Renvela 800mg - 6AC TID, 2 w/snacks      Impression: 1  Fall / SDH - sp craniotomy on 2/24 2  ESRD TTS HD - clotted LUA  AVF thrombosis, prob related to chronic hypotension.  Has temp cath, [facility] consulted for [facility] and perm access, earliest would be Wed per their notes 3  HTN - low BP's initially, better now 4  Anemia ABL/ CKD - sp transfusion, prob will need more prbc on HD Tuesday; start darbe here 100 q Tues on 3/5 5  MBD ckd - f/u phos level 6  Nutrition - NG in place, on TFs 7  Syst/ diast HF - holding meds for low bp 8  Hx lymphedema 9  Volume - looks overloaded w facial edema , no resp issues; UF 2.5 - 4 L here so far  Plan - HD Tuesday, UF 4L , give 1 u prbc on HD   Kelly Splinter MD [place] pager [phone]   12/12/2017, 9:17 PM   Recent Labs  Lab 12/06/17 0256  12/10/17 0517 12/11/17 0352 12/12/17 0931  NA 136   < > 136 133* 134*  K 5.3*   < > 5.2* 4.4 3.6  CL 99*   < > 97* 92* 95*  CO2 19*   < > 26 23 25   GLUCOSE 92   < > 112* 176* 97  BUN 70*   < > 43* 67* 45*  CREATININE 9.37*   < > 5.54* 6.71* 4.79*  CALCIUM 7.7*   < > 8.2* 8.5* 8.3*  PHOS 8.8*  --   --   --  5.0*   < > = values in this interval not displayed.   Recent Labs  Lab 12/09/17 0430 12/10/17 0517 12/11/17 0352 12/12/17 0931  AST 292* 337* 265*  --   ALT 504* 503* 488*  --   ALKPHOS 88 120 140*  --   BILITOT 1.2 1.8* 1.6*  --   PROT 5.3* 5.4* 6.0*  --   ALBUMIN 2.5* 2.5* 2.8* 2.6*   Recent Labs  Lab 12/09/17 0430 12/10/17 0517 12/11/17 0352  WBC 13.6* 11.2* 8.2  NEUTROABS 11.6* 9.4* 6.6  HGB 7.4* 7.4* 7.4*  HCT 22.2* 22.0* 22.4*  MCV 89.2 90.9 90.3  PLT 146* 170 154   Iron/TIBC/Ferritin/ %Sat    Component Value Date/Time   IRON 17 (L) 12/07/2015 2122   TIBC 213 (L) 12/07/2015 2122   FERRITIN 544 (H) 12/07/2015 2122   IRONPCTSAT 8 (L) 12/07/2015 2122   IRONPCTSAT 24 05/03/2006 1529

## 2017-12-12 NOTE — Consult Note (Signed)
PROGRESS NOTE    Albert Stanley  GLO:[id number] DOB: July 24, 1946 DOA: 12/05/2017 PCP: [name], MD    Brief Narrative:  72 y.o. male. ESRD 2/2 DM on HD TTS at [facility], first starting in 12/2015.  Past medical history significant for lymphedema, NICM with EF 40%, GERD, h/o DVT not on anticoagulation, h/o renal mass/atypical cyst and bladder Ca s/p resection in 1999.  Patient was admitted for further management of acute right panhemispheric subdural hematoma  Assessment & Plan:   Active Problems:   S/P craniotomy - Neurosurgery on board and directing care.    Endotracheally intubated - resolved satting well on room air.     SDH (subdural hematoma) ([name]) -  S/p craniotomy for hematoma evacuation  DM - stable on current regimen will continue.     Chronic renal impairment - Nephrology on board and plans are to find permanent access - nephro to consult Vascular surgery for conversion of a tunneled HD catheter to permit an option.    Transaminitis  Npo - 2ary to mentation patient poses risk for aspiration as such he is nothing by mouth - currently has NG tube which he has had for 4 days. Getting nutrition via that route.  DVT prophylaxis: scd's Code Status: Full Family Communication: None at bedside Disposition Plan: Per primary    Procedures: Craniotomy  Subjective: He reports feeling "okay"   Objective: Vitals:   12/12/17 0007 12/12/17 0447 12/12/17 0500 12/12/17 0835  BP: 131/67 135/74  127/77  Pulse: 85 79  89  Resp: 18 18  17   Temp: 98 F (36.7 C) 98 F (36.7 C)  98.6 F (37 C)  TempSrc: Oral Oral  Oral  SpO2: 100% 99%  99%  Weight:   95.3 kg (210 lb 1.6 oz)   Height:       No intake or output data in the 24 hours ending 12/12/17 1414 Filed Weights   12/11/17 0740 12/11/17 1148 12/12/17 0500  Weight: 94.5 kg (208 lb 5.4 oz) 92 kg (202 lb 13.2 oz) 95.3 kg (210 lb 1.6 oz)    Examination: Exam unchanged when compared to  12/11/2017  General exam: Appears calm and comfortable, no acute distress Respiratory system: Clear to auscultation. Respiratory effort normal. Equal chest rise. Cardiovascular system: S1 & S2 heard, RRR. No JVD, murmurs, rubs Gastrointestinal system: Abdomen is nondistended, soft and nontender. No organomegaly or masses felt. Normal bowel sounds heard. Central nervous system: Alert and awake. Responds verbally to questions Extremities: warm, no cyanosis Skin: No rashes, lesions or ulcers, on limited exam. Psychiatry:  Mood & affect appropriate.   Data Reviewed: I have personally reviewed following labs and imaging studies  CBC: Recent Labs  Lab 12/07/17 0516 12/07/17 1729 12/08/17 0334 12/09/17 0430 12/10/17 0517 12/11/17 0352  WBC 13.0* 11.7* 10.1 13.6* 11.2* 8.2  NEUTROABS 11.3*  --  8.5* 11.6* 9.4* 6.6  HGB 7.2* 7.3* 7.0* 7.4* 7.4* 7.4*  HCT 20.9* 20.9* 20.1* 22.2* 22.0* 22.4*  MCV 89.7 88.6 88.9 89.2 90.9 90.3  PLT 135* 113* 93* 146* 170 188   Basic Metabolic Panel: Recent Labs  Lab 12/06/17 0256 12/07/17 0516  12/08/17 0334 12/09/17 0430 12/10/17 0517 12/11/17 0352 12/12/17 0931  NA 136 138   < > 133* 132* 136 133* 134*  K 5.3* 6.2*   < > 5.1 5.9* 5.2* 4.4 3.6  CL 99* 102   < > 97* 95* 97* 92* 95*  CO2 19* 17*   < >  21* 21* 26 23 25   GLUCOSE 92 113*   < > 170* 165* 112* 176* 97  BUN 70* 82*   < > 45* 61* 43* 67* 45*  CREATININE 9.37* 10.35*   < > 6.87* 8.23* 5.54* 6.71* 4.79*  CALCIUM 7.7* 8.0*   < > 7.5* 7.7* 8.2* 8.5* 8.3*  MG 2.4 2.5*  --  2.1 2.3 2.3 2.4  --   PHOS 8.8*  --   --   --   --   --   --  5.0*   < > = values in this interval not displayed.   GFR: Estimated Creatinine Clearance: 15.9 mL/min (A) (by C-G formula based on SCr of 4.79 mg/dL (H)). Liver Function Tests: Recent Labs  Lab 12/07/17 0516 12/08/17 0334 12/09/17 0430 12/10/17 0517 12/11/17 0352 12/12/17 0931  AST 214* 615* 292* 337* 265*  --   ALT 167* 587* 504* 503* 488*  --    ALKPHOS 51 49 88 120 140*  --   BILITOT 0.9 1.2 1.2 1.8* 1.6*  --   PROT 5.9* 4.9* 5.3* 5.4* 6.0*  --   ALBUMIN 2.6* 2.4* 2.5* 2.5* 2.8* 2.6*   No results for input(s): LIPASE, AMYLASE in the last 168 hours. No results for input(s): AMMONIA in the last 168 hours. Coagulation Profile: Recent Labs  Lab 12/05/17 1626 12/07/17 0516 12/08/17 0334 12/09/17 0430  INR 1.38 1.59 1.69 1.72   Cardiac Enzymes: Recent Labs  Lab 12/05/17 2349  TROPONINI 0.57*   BNP (last 3 results) No results for input(s): PROBNP in the last 8760 hours. HbA1C: No results for input(s): HGBA1C in the last 72 hours. CBG: Recent Labs  Lab 12/11/17 1838 12/11/17 2022 12/12/17 0107 12/12/17 0533 12/12/17 1339  GLUCAP 166* 134* 125* 189* 188*   Lipid Profile: No results for input(s): CHOL, HDL, LDLCALC, TRIG, CHOLHDL, LDLDIRECT in the last 72 hours. Thyroid Function Tests: No results for input(s): TSH, T4TOTAL, FREET4, T3FREE, THYROIDAB in the last 72 hours. Anemia Panel: No results for input(s): VITAMINB12, FOLATE, FERRITIN, TIBC, IRON, RETICCTPCT in the last 72 hours. Sepsis Labs: No results for input(s): PROCALCITON, LATICACIDVEN in the last 168 hours.  Recent Results (from the past 240 hour(s))  MRSA PCR Screening     Status: Abnormal   Collection Time: 12/05/17  7:10 PM  Result Value Ref Range Status   MRSA by PCR POSITIVE (A) NEGATIVE Final    Comment:        The GeneXpert MRSA Assay (FDA approved for NASAL specimens only), is one component of a comprehensive MRSA colonization surveillance program. It is not intended to diagnose MRSA infection nor to guide or monitor treatment for MRSA infections. RESULT CALLED TO, READ BACK BY AND VERIFIED WITH: PRIDGEN,T RN 12/05/17 2222 SKEEN,P      Radiology Studies: No results found.  Scheduled Meds: . Chlorhexidine Gluconate Cloth  6 each Topical Q2200  . feeding supplement (NEPRO CARB STEADY)  1,000 mL Per Tube Q24H  . feeding  supplement (PRO-STAT SUGAR FREE 64)  30 mL Per Tube BID  . insulin aspart  2-6 Units Subcutaneous TID WC  . mouth rinse  15 mL Mouth Rinse BID  . mupirocin ointment   Nasal BID  . ondansetron (ZOFRAN) IV  4 mg Intravenous Once  . oxybutynin  5 mg Oral BID  . pantoprazole sodium  40 mg Per Tube QHS  . polyethylene glycol  17 g Oral Daily  . QUEtiapine  25 mg Oral BID  .  sodium chloride flush  10-40 mL Intracatheter Q12H   Continuous Infusions: . sodium chloride Stopped (12/08/17 0939)  . dextrose Stopped (12/08/17 1554)     LOS: 7 days    Time spent: > 20 minutes  Velvet Bathe, MD [facility] Pager [phone]  If 7PM-7AM, please contact night-coverage [email or web address] Password [facility] 12/12/2017, 2:14 PM

## 2017-12-13 ENCOUNTER — Inpatient Hospital Stay (HOSPITAL_COMMUNITY): Payer: Medicare Other

## 2017-12-13 DIAGNOSIS — N186 End stage renal disease: Secondary | ICD-10-CM

## 2017-12-13 DIAGNOSIS — Z992 Dependence on renal dialysis: Secondary | ICD-10-CM

## 2017-12-13 DIAGNOSIS — D62 Acute posthemorrhagic anemia: Secondary | ICD-10-CM

## 2017-12-13 DIAGNOSIS — R1310 Dysphagia, unspecified: Secondary | ICD-10-CM

## 2017-12-13 DIAGNOSIS — I428 Other cardiomyopathies: Secondary | ICD-10-CM

## 2017-12-13 DIAGNOSIS — I5032 Chronic diastolic (congestive) heart failure: Secondary | ICD-10-CM

## 2017-12-13 DIAGNOSIS — D638 Anemia in other chronic diseases classified elsewhere: Secondary | ICD-10-CM

## 2017-12-13 DIAGNOSIS — E119 Type 2 diabetes mellitus without complications: Secondary | ICD-10-CM

## 2017-12-13 DIAGNOSIS — I10 Essential (primary) hypertension: Secondary | ICD-10-CM

## 2017-12-13 LAB — GLUCOSE, CAPILLARY
GLUCOSE-CAPILLARY: 169 mg/dL — AB (ref 65–99)
GLUCOSE-CAPILLARY: 192 mg/dL — AB (ref 65–99)
GLUCOSE-CAPILLARY: 194 mg/dL — AB (ref 65–99)
GLUCOSE-CAPILLARY: 55 mg/dL — AB (ref 65–99)
Glucose-Capillary: 128 mg/dL — ABNORMAL HIGH (ref 65–99)
Glucose-Capillary: 193 mg/dL — ABNORMAL HIGH (ref 65–99)
Glucose-Capillary: 198 mg/dL — ABNORMAL HIGH (ref 65–99)
Glucose-Capillary: 64 mg/dL — ABNORMAL LOW (ref 65–99)

## 2017-12-13 LAB — PREPARE RBC (CROSSMATCH)

## 2017-12-13 MED ORDER — GLUCOSE 40 % PO GEL
ORAL | Status: AC
  Administered 2017-12-13: 37.5 g
  Filled 2017-12-13: qty 1

## 2017-12-13 MED ORDER — DARBEPOETIN ALFA 100 MCG/0.5ML IJ SOSY: 100.0000 ug | PREFILLED_SYRINGE | INTRAMUSCULAR | Status: DC

## 2017-12-13 MED ORDER — SODIUM CHLORIDE 0.9 % IV SOLN: Freq: Once | INTRAVENOUS | Status: DC

## 2017-12-13 MED ORDER — INSULIN ASPART 100 UNIT/ML ~~LOC~~ SOLN
0.0000 [IU] | Freq: Three times a day (TID) | SUBCUTANEOUS | Status: DC
  Administered 2017-12-14: 1 [IU] via SUBCUTANEOUS
  Administered 2017-12-14: 2 [IU] via SUBCUTANEOUS
  Administered 2017-12-15: 1 [IU] via SUBCUTANEOUS
  Administered 2017-12-16: 2 [IU] via SUBCUTANEOUS

## 2017-12-13 NOTE — Consult Note (Signed)
PROGRESS NOTE    Albert Stanley  NKN:[id number] DOB: 08/21/1946 DOA: 12/05/2017 PCP: Albert Carol, MD    Brief Narrative:  72 y.o. male. 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  Past medical history significant for lymphedema, NICM with EF 40%, GERD, h/o DVT not on anticoagulation, h/o renal mass/atypical cyst and bladder Ca s/p resection in 1999.  Patient was admitted for further management of acute right panhemispheric subdural hematoma  His L BC AVF thrombosed in the recovery period of his craniotomy and a temporary dialysis catheter was placed.  Assessment & Plan:   Active Problems:   S/P craniotomy - Neurosurgery on board and directing care.    Endotracheally intubated - resolved satting well on room air.     SDH (subdural hematoma) ([name]) -  S/p craniotomy for hematoma evacuation  DM - Given hypoglycemia will discontinue current insulin regimen and place on sensitive sliding scale.    Chronic renal impairment - Nephrology on board and plans are to find permanent access - nephro to consult Vascular surgery for conversion of a tunneled HD catheter to permit an option.    Transaminitis  Npo - 2ary to mentation patient poses risk for aspiration as such he is nothing by mouth - currently has NG tube which he has had for 4 days. Getting nutrition via that route.  DVT prophylaxis: scd's Code Status: Full Family Communication: None at bedside Disposition Plan: Per primary    Procedures: Craniotomy  Subjective: He reports no new complaints and is looking toward discharging.  Objective: Vitals:   12/13/17 0423 12/13/17 0803 12/13/17 1143 12/13/17 1625  BP: 112/61 (!) 141/83 117/73 118/66  Pulse: 76 82 83 79  Resp: 20 20 18 18   Temp: 98.1 F (36.7 C) 98.6 F (37 C) 98.3 F (36.8 C) 98.5 F (36.9 C)  TempSrc: Oral Oral Oral Axillary  SpO2: 97% 94% 96% 100%  Weight: 100 kg (220 lb 7.4 oz)     Height:        Intake/Output  Summary (Last 24 hours) at 12/13/2017 1735 Last data filed at 12/13/2017 1700 Gross per 24 hour  Intake 510 ml  Output -  Net 510 ml   Filed Weights   12/11/17 1148 12/12/17 0500 12/13/17 0423  Weight: 92 kg (202 lb 13.2 oz) 95.3 kg (210 lb 1.6 oz) 100 kg (220 lb 7.4 oz)    Examination:   General exam: Appears calm and comfortable, no acute distress Respiratory system: Clear to auscultation. Respiratory effort normal. Equal chest rise. Cardiovascular system: S1 & S2 heard, RRR. No JVD, murmurs, rubs Gastrointestinal system: Abdomen is nondistended, soft and nontender. No organomegaly or masses felt. Normal bowel sounds heard. Central nervous system: Alert and awake. Responds verbally to questions. Weakness on his left side when compared to his right Extremities: warm, no cyanosis Skin: No rashes, lesions or ulcers, on limited exam. Psychiatry:  Mood & affect appropriate.   Data Reviewed: I have personally reviewed following labs and imaging studies  CBC: Recent Labs  Lab 12/07/17 0516 12/07/17 1729 12/08/17 0334 12/09/17 0430 12/10/17 0517 12/11/17 0352  WBC 13.0* 11.7* 10.1 13.6* 11.2* 8.2  NEUTROABS 11.3*  --  8.5* 11.6* 9.4* 6.6  HGB 7.2* 7.3* 7.0* 7.4* 7.4* 7.4*  HCT 20.9* 20.9* 20.1* 22.2* 22.0* 22.4*  MCV 89.7 88.6 88.9 89.2 90.9 90.3  PLT 135* 113* 93* 146* 170 379   Basic Metabolic Panel: Recent Labs  Lab 12/07/17 0516  12/08/17 0334 12/09/17 0430 12/10/17 0517 12/11/17 0352 12/12/17 0931  NA 138   < > 133* 132* 136 133* 134*  K 6.2*   < > 5.1 5.9* 5.2* 4.4 3.6  CL 102   < > 97* 95* 97* 92* 95*  CO2 17*   < > 21* 21* 26 23 25   GLUCOSE 113*   < > 170* 165* 112* 176* 97  BUN 82*   < > 45* 61* 43* 67* 45*  CREATININE 10.35*   < > 6.87* 8.23* 5.54* 6.71* 4.79*  CALCIUM 8.0*   < > 7.5* 7.7* 8.2* 8.5* 8.3*  MG 2.5*  --  2.1 2.3 2.3 2.4  --   PHOS  --   --   --   --   --   --  5.0*   < > = values in this interval not displayed.   GFR: Estimated Creatinine  Clearance: 16.2 mL/min (A) (by C-G formula based on SCr of 4.79 mg/dL (H)). Liver Function Tests: Recent Labs  Lab 12/07/17 0516 12/08/17 0334 12/09/17 0430 12/10/17 0517 12/11/17 0352 12/12/17 0931  AST 214* 615* 292* 337* 265*  --   ALT 167* 587* 504* 503* 488*  --   ALKPHOS 51 49 88 120 140*  --   BILITOT 0.9 1.2 1.2 1.8* 1.6*  --   PROT 5.9* 4.9* 5.3* 5.4* 6.0*  --   ALBUMIN 2.6* 2.4* 2.5* 2.5* 2.8* 2.6*   No results for input(s): LIPASE, AMYLASE in the last 168 hours. No results for input(s): AMMONIA in the last 168 hours. Coagulation Profile: Recent Labs  Lab 12/07/17 0516 12/08/17 0334 12/09/17 0430  INR 1.59 1.69 1.72   Cardiac Enzymes: No results for input(s): CKTOTAL, CKMB, CKMBINDEX, TROPONINI in the last 168 hours. BNP (last 3 results) No results for input(s): PROBNP in the last 8760 hours. HbA1C: No results for input(s): HGBA1C in the last 72 hours. CBG: Recent Labs  Lab 12/13/17 0411 12/13/17 0759 12/13/17 1138 12/13/17 1621 12/13/17 1708  GLUCAP 169* 193* 194* 55* 64*   Lipid Profile: No results for input(s): CHOL, HDL, LDLCALC, TRIG, CHOLHDL, LDLDIRECT in the last 72 hours. Thyroid Function Tests: No results for input(s): TSH, T4TOTAL, FREET4, T3FREE, THYROIDAB in the last 72 hours. Anemia Panel: No results for input(s): VITAMINB12, FOLATE, FERRITIN, TIBC, IRON, RETICCTPCT in the last 72 hours. Sepsis Labs: No results for input(s): PROCALCITON, LATICACIDVEN in the last 168 hours.  Recent Results (from the past 240 hour(s))  MRSA PCR Screening     Status: Abnormal   Collection Time: 12/05/17  7:10 PM  Result Value Ref Range Status   MRSA by PCR POSITIVE (A) NEGATIVE Final    Comment:        The GeneXpert MRSA Assay (FDA approved for NASAL specimens only), is one component of a comprehensive MRSA colonization surveillance program. It is not intended to diagnose MRSA infection nor to guide or monitor treatment for MRSA infections. RESULT  CALLED TO, READ BACK BY AND VERIFIED WITH: PRIDGEN,T RN 12/05/17 2222 SKEEN,P      Radiology Studies: No results found.  Scheduled Meds: . Chlorhexidine Gluconate Cloth  6 each Topical Q2200  . [START ON 12/14/2017] darbepoetin (ARANESP) injection - DIALYSIS  100 mcg Intravenous Q Tue-HD  . feeding supplement (NEPRO CARB STEADY)  1,000 mL Per Tube Q24H  . feeding supplement (PRO-STAT SUGAR FREE 64)  30 mL Per Tube BID  . [START ON 12/14/2017] insulin aspart  0-9 Units Subcutaneous TID WC  .  mouth rinse  15 mL Mouth Rinse BID  . mupirocin ointment   Nasal BID  . ondansetron (ZOFRAN) IV  4 mg Intravenous Once  . oxybutynin  5 mg Oral BID  . pantoprazole sodium  40 mg Per Tube QHS  . polyethylene glycol  17 g Oral Daily  . QUEtiapine  25 mg Oral BID  . sodium chloride flush  10-40 mL Intracatheter Q12H   Continuous Infusions: . sodium chloride Stopped (12/08/17 0939)  . sodium chloride    . dextrose Stopped (12/08/17 1554)     LOS: 8 days    Time spent: > 20 minutes  Velvet Bathe, MD [facility] Pager [phone]  If 7PM-7AM, please contact night-coverage [email or web address] Password [facility] 12/13/2017, 5:35 PM

## 2017-12-13 NOTE — Consult Note (Addendum)
Physical Medicine and Rehabilitation Consult Reason for Consult: Decreased functional mobility Referring Physician: Dr. Cyndy Freeze   HPI: Albert Stanley is a 72 y.o. right handed male with history of lymphedema, end-stage renal disease with hemodialysis, diastolic congestive heart failure, diabetes mellitus, hypertension, nonischemic cardiomyopathy. History taken from chart review. Patient lives with spouse and daughter. Independent with assistive device prior to admission. Independent ADLs and still driving. Wife works during the day. His daughter is in and out during the day. Presented 12/05/2017 with fatigue, vomiting and diarrhea 2 days as well as headache. He had missed his last dialysis session. Noted fall 12/03/2017 when he tripped on the carpet. Denied striking his head. Cranial CT reviewed showing right SDH.  Per report, large right side subdural hematoma measuring 16 mm in width with significant mass effect and a 13 mm right to left midline shift. Mass effect effaces the sulci and right lateral ventricle. Uncal herniation present. Neurosurgery consulted underwent right frontotemporal parietal craniotomy hematoma evacuation 12/05/2017 per Dr. Cyndy Freeze. Hospital course hemodialysis ongoing. Intermittent bouts of restlessness maintained on Seroquel. Acute on chronic anemia 7.4. Dysphagia #1 thin liquid diet. Nasogastric tube remains in place. MRSA PCR screening positive maintained on contact precautions. Physical therapy evaluation completed with recommendations of physical medicine rehabilitation consult   Review of Systems  Constitutional: Positive for malaise/fatigue. Negative for chills and fever.  HENT: Negative for hearing loss.   Eyes: Negative for blurred vision and double vision.  Respiratory: Positive for shortness of breath. Negative for cough.   Cardiovascular: Positive for leg swelling. Negative for chest pain and palpitations.  Gastrointestinal: Positive for diarrhea and  nausea.       GERD  Genitourinary: Negative for flank pain and hematuria.  Musculoskeletal: Positive for falls and myalgias.  Skin: Negative for rash.  Neurological: Positive for headaches.  All other systems reviewed and are negative.  Past Medical History:  Diagnosis Date  . Arthritis    Osteoarthritis  . Asthma   . Bladder cancer ([place]) dx'd 1990   surg only  . CHF (congestive heart failure) ([place])   . Diabetes mellitus without complication ([place])   . ESRD (end stage renal disease) ([name])    Tues, Thurs, Sat dialysis  . GERD (gastroesophageal reflux disease)   . History of cardiac catheterization    a. LHC 5/17: no obstructive CAD  . History of DVT (deep vein thrombosis)    2003 ish  . History of nuclear stress test    a. Myoview 4/17: EF 39%, inf, inf-lat, apical inf, apical lat, apical scar, intermediate risk  . Hyperlipidemia   . Hypertension   . NICM (nonischemic cardiomyopathy) ([place])    a. Echo 2/17: mod LVH, EF 35-40%, inf-lat and inf-sept HK, mod MR, severe LAE, small pericardial effusion   Past Surgical History:  Procedure Laterality Date  . arthroscopic knee surgery Left   . AV FISTULA PLACEMENT Left 12/13/2015   Procedure: ARTERIOVENOUS (AV) FISTULA CREATION;  Surgeon: Angelia Mould, MD;  Location: [employer] [facility];  Service: Vascular;  Laterality: Left;  . BACK SURGERY     2 times  1960  . CARDIAC CATHETERIZATION N/A 02/28/2016   Procedure: Left Heart Cath and Coronary Angiography;  Surgeon: Larey Dresser, MD;  Location: [place] [facility];  Service: Cardiovascular;  Laterality: N/A;  . CRANIOTOMY N/A 12/05/2017   Procedure: CRANIOTOMY HEMATOMA EVACUATION SUBDURAL;  Surgeon: Ashok Pall, MD;  Location: [place];  Service: Neurosurgery;  Laterality: N/A;  . CYSTOSCOPY    .  FISTULA SUPERFICIALIZATION Left 08/14/2016   Procedure: FISTULA SUPERFICIALIZATION LEFT UPPER ARM;  Surgeon: Angelia Mould, MD;  Location: [place];  Service: Vascular;  Laterality: Left;  .  INSERTION OF DIALYSIS CATHETER N/A 12/13/2015   Procedure: INSERTION OF DIALYSIS CATHETER;  Surgeon: Angelia Mould, MD;  Location: [name];  Service: Vascular;  Laterality: N/A;  . INSERTION OF DIALYSIS CATHETER Left 05/07/2017   Procedure: INSERTION OF DIALYSIS CATHETER LEFT INTERNAL JUGULAR PLACEMENT;  Surgeon: Angelia Mould, MD;  Location: [place];  Service: Vascular;  Laterality: Left;  . IR RADIOLOGIST EVAL & MGMT  05/12/2017  . JOINT REPLACEMENT Left    hip  . LIGATION OF COMPETING BRANCHES OF ARTERIOVENOUS FISTULA Left 08/14/2016   Procedure: LIGATION OF COMPETING BRANCHES OF ARTERIOVENOUS FISTULA LEFT UPPER ARM;  Surgeon: Angelia Mould, MD;  Location: [place];  Service: Vascular;  Laterality: Left;  . PERIPHERAL VASCULAR CATHETERIZATION Left 06/09/2016   Procedure: Fistulagram;  Surgeon: Serafina Mitchell, MD;  Location: [place] [facility];  Service: Cardiovascular;  Laterality: Left;  ARM  . PERIPHERAL VASCULAR CATHETERIZATION Left 06/09/2016   Procedure: Peripheral Vascular Balloon Angioplasty;  Surgeon: Serafina Mitchell, MD;  Location: [place] [facility];  Service: Cardiovascular;  Laterality: Left;  upper arm venous  . PERIPHERAL VASCULAR CATHETERIZATION Left 06/25/2016   Procedure: Fistulagram;  Surgeon: Serafina Mitchell, MD;  Location: [place] [facility];  Service: Cardiovascular;  Laterality: Left;  . REVISON OF ARTERIOVENOUS FISTULA Left 05/07/2017   Procedure: REPAIR OF PSEUDOANEURYSM  LEFT BRACHIO-CEPHALIC  ARM ARTERIOVENOUS FISTULA;  Surgeon: Angelia Mould, MD;  Location: [facility];  Service: Vascular;  Laterality: Left;   Family History  Problem Relation Age of Onset  . Arthritis Mother   . Cancer Father   . Heart disease Father   . Hypertension Sister   . Alcohol abuse Brother   . Diabetes Daughter   . Heart attack Paternal Uncle    Social History:  reports that he quit smoking about 19 years ago. He quit after 30.00 years of use. he has never used smokeless  tobacco. He reports that he does not drink alcohol or use drugs. Allergies:  Allergies  Allergen Reactions  . Meperidine Shortness Of Breath and Other (See Comments)    Increased heart rate Reaction to demerol  . Sulfa Antibiotics Shortness Of Breath and Other (See Comments)    Increased heart rate  . Sulfonamide Derivatives Shortness Of Breath and Other (See Comments)    Increased heart rate  . Eggs Or Egg-Derived Products Nausea And Vomiting     Reaction to egg yolks - not whites    . Foltx [Elfolate Plus] Other (See Comments)    Pt does not recall reaction to this  . [place] Diarrhea, Nausea And Vomiting and Other (See Comments)    Constipation Reaction to whole milk  . Lactose Intolerance (Gi) Diarrhea, Nausea And Vomiting and Other (See Comments)    Constipation Reaction to whole milk  . Milk-Related Compounds Diarrhea, Nausea And Vomiting and Other (See Comments)    Constipation Reaction to whole milk  . Penicillins Other (See Comments)    "Knocks me out."   Has patient had a PCN reaction causing immediate rash, facial/tongue/throat swelling, SOB or lightheadedness with hypotension: yes- blacked out Has patient had a PCN reaction causing severe rash involving mucus membranes or skin necrosis: unknown Has patient had a PCN reaction that required hospitalization: at MD office Has patient had a PCN  reaction occurring within the last 10 years: no If all of the above answers are "NO", then may proceed with Cephalosporin use.   . Betadine [Povidone Iodine] Itching, Rash and Other (See Comments)    burning   Medications Prior to Admission  Medication Sig Dispense Refill  . acetaminophen (TYLENOL) 500 MG tablet Take 1,000 mg by mouth every 6 (six) hours as needed for mild pain.    Marland Kitchen albuterol (PROVENTIL HFA;VENTOLIN HFA) 108 (90 Base) MCG/ACT inhaler Inhale 1-2 puffs into the lungs every 6 (six) hours as needed for wheezing or shortness of breath. 1 Inhaler 0  . allopurinol  (ZYLOPRIM) 300 MG tablet Take 300 mg by mouth daily.     Marland Kitchen amiodarone (PACERONE) 200 MG tablet Take 1 tablet (200 mg total) by mouth daily. 30 tablet 3  . calcitRIOL (ROCALTROL) 0.5 MCG capsule Take 0.5 mcg by mouth Every Tuesday,Thursday,and Saturday with dialysis. Medication given at dialysis on Tues Thurs Sat  1  . diphenhydrAMINE (BENADRYL) 25 MG tablet Take 25 mg by mouth 2 (two) times daily as needed for itching or allergies.     Marland Kitchen glipiZIDE (GLUCOTROL) 5 MG tablet Take 0.5 tablets (2.5 mg total) by mouth 2 (two) times daily. (Patient taking differently: Take 2.5 mg by mouth See admin instructions. Take as needed...one tablet in the morning if BS is greater than 80.)    . Menthol, Topical Analgesic, (ICY HOT EX) Apply 1 application topically 3 (three) times daily as needed (pain).    . multivitamin (RENA-VIT) TABS tablet Take 1 tablet by mouth at bedtime.   0  . oxyCODONE-acetaminophen (PERCOCET) 7.5-325 MG tablet Take 1 tablet by mouth 3 (three) times daily as needed (pain). (Patient taking differently: Take 1 tablet by mouth 4 (four) times daily as needed (pain). ) 20 tablet 0  . sevelamer carbonate (RENVELA) 800 MG tablet Take 2 tablets (1,600 mg total) by mouth 3 (three) times daily with meals. (Patient taking differently: Take 2,400 mg by mouth See admin instructions. Take 3 tablets (2400 mg) by mouth daily with meals - usually twice daily) 90 tablet 0  . carvedilol (COREG) 3.125 MG tablet Take 1 tablet (3.125 mg total) by mouth See admin instructions. Take 1 tablet (3.125 mg) by mouth twice daily on Monday, Wednesday, Friday and Sunday (non-dialysis days) (Patient not taking: Reported on 12/04/2017)    . famotidine (PEPCID) 20 MG tablet Take 1 tablet (20 mg total) by mouth 2 (two) times daily as needed (sour stomach). (Patient not taking: Reported on 12/04/2017)      Home: Home Living Family/patient expects to be discharged to:: Private residence Living Arrangements: Spouse/significant  other Available Help at Discharge: Family, Available PRN/intermittently(per husband wife works, ? if family could provide 29* care.) Type of Home: House Home Access: Stairs to enter Technical brewer of Steps: 5 Entrance Stairs-Rails: Right, Left Home Layout: Two level Alternate Level Stairs-Number of Steps: flight Alternate Level Stairs-Rails: (chair lift ) Bathroom Shower/Tub: Multimedia programmer: Handicapped height Home Equipment: Environmental consultant - 2 wheels, Crutches  Functional History: Prior Function Level of Independence: Independent with assistive device(s) Comments: pt reports independent in ADL;s and drove Functional Status:  Mobility: Bed Mobility Overal bed mobility: Needs Assistance Bed Mobility: Rolling, Sidelying to Sit, Sit to Supine Rolling: Mod assist Sidelying to sit: Max assist Sit to supine: Max assist General bed mobility comments: cues for sequence, step by step direction due to inattention Transfers Overall transfer level: Needs assistance Transfers: Sit to/from Stand, Lateral/Scoot Transfers  Sit to Stand: Max assist, From elevated surface  Lateral/Scoot Transfers: Max assist, From elevated surface General transfer comment: cues for technique, and focus on task, assist to come forward, support L side and boost.      ADL:    Cognition: Cognition Overall Cognitive Status: Impaired/Different from baseline Orientation Level: Oriented to person, Oriented to place, Oriented to situation, Disoriented to time Cognition Arousal/Alertness: Awake/alert Behavior During Therapy: Restless, Impulsive Overall Cognitive Status: Impaired/Different from baseline  Blood pressure 112/61, pulse 76, temperature 98.1 F (36.7 C), temperature source Oral, resp. rate 20, height 5\' 9"  (1.753 m), weight 100 kg (220 lb 7.4 oz), SpO2 97 %. Physical Exam  Vitals reviewed. Constitutional: He is oriented to person, place, and time. He appears well-developed and  well-nourished.  HENT:  Nasogastric tube in place.  Cranio site clean and dry Right facial edema  Eyes: EOM are normal. Right eye exhibits no discharge. Left eye exhibits no discharge.  Right lid edema  Neck: Normal range of motion. Neck supple. No thyromegaly present.  Cardiovascular: Normal rate, regular rhythm and normal heart sounds.  Respiratory: Effort normal and breath sounds normal. No respiratory distress.  GI: Soft. Bowel sounds are normal. He exhibits no distension.  Musculoskeletal:  LE edema  Neurological: He is alert and oriented to person, place, and time.  Follow simple commands. Motor: RUE: 5/5 proximal to distal LUE: 4-4+/5 proximal to distal RLE: 4+/5 proximal to distal LLE: HF 2/5, KE 2+/5, ADF 4-/5  Skin: Skin is warm and dry.  Psychiatric: His affect is blunt. Cognition and memory are impaired. He expresses impulsivity.    Results for orders placed or performed during the hospital encounter of 12/05/17 (from the past 24 hour(s))  Renal function panel     Status: Abnormal   Collection Time: 12/12/17  9:31 AM  Result Value Ref Range   Sodium 134 (L) 135 - 145 mmol/L   Potassium 3.6 3.5 - 5.1 mmol/L   Chloride 95 (L) 101 - 111 mmol/L   CO2 25 22 - 32 mmol/L   Glucose, Bld 97 65 - 99 mg/dL   BUN 45 (H) 6 - 20 mg/dL   Creatinine, Ser 4.79 (H) 0.61 - 1.24 mg/dL   Calcium 8.3 (L) 8.9 - 10.3 mg/dL   Phosphorus 5.0 (H) 2.5 - 4.6 mg/dL   Albumin 2.6 (L) 3.5 - 5.0 g/dL   GFR calc non Af Amer 11 (L) >60 mL/min   GFR calc Af Amer 13 (L) >60 mL/min   Anion gap 14 5 - 15  Glucose, capillary     Status: Abnormal   Collection Time: 12/12/17  1:39 PM  Result Value Ref Range   Glucose-Capillary 188 (H) 65 - 99 mg/dL  Glucose, capillary     Status: Abnormal   Collection Time: 12/12/17  7:46 PM  Result Value Ref Range   Glucose-Capillary 121 (H) 65 - 99 mg/dL   Comment 1 Notify RN    Comment 2 Document in Chart   Glucose, capillary     Status: Abnormal   Collection  Time: 12/13/17 12:08 AM  Result Value Ref Range   Glucose-Capillary 128 (H) 65 - 99 mg/dL   Comment 1 Notify RN    Comment 2 Document in Chart   Glucose, capillary     Status: Abnormal   Collection Time: 12/13/17  4:11 AM  Result Value Ref Range   Glucose-Capillary 169 (H) 65 - 99 mg/dL   Comment 1 Notify RN    Comment  2 Document in Chart    No results found.  Assessment/Plan: Diagnosis: TBI  [place] score:  IV  Speech to evaluate for Post traumatic amnesia and interval GOAT scores to assess progress.  NeuroPsych evaluation for behavorial assessment.  Provide environmental management by reducing the level of stimulation, tolerating restlessness when possible, protecting patient from harming self or others and reducing patient's cognitive confusion.  Address behavioral concerns include providing structured environments and daily routines.  Cognitive therapy to direct modular abilities in order to maintain goals  including problem solving, self regulation/monitoring, self management, attention, and memory.  Fall precautions; pt at risk for second impact syndrome  Prevention of secondary injury: monitor for hypotension, hypoxia, seizures or signs of increased ICP  Prophylactic AED:   Consider pharmacological intervention if necessary with neurostimulants, such as amantadine, methylphenidate, modafinil, etc.  Consider Propranolol for agitation and storming  Avoid medications that could impair cognitive abilities, such as anticholinergics, antihistaminic, benzodiazapines, narcotics, etc when possible  1. Does the need for close, 24 hr/day medical supervision in concert with the patient's rehab needs make it unreasonable for this patient to be served in a less intensive setting? Yes  2. Co-Morbidities requiring supervision/potential complications: Dysphagia (advance diet as tolerated), Acute on chronic anemia (transfuse if necessary to ensure appropriate perfusion for increased  activity tolerance), lymphedema, end-stage renal disease with hemodialysis (recs per Nephro), diastolic congestive heart failure (monitor for signs/symptoms of fluid overload), diabetes mellitus (Monitor in accordance with exercise and adjust meds as necessary), HTN (monitor and provide prns in accordance with increased physical exertion and pain), nonischemic cardiomyopathy  3. Due to bladder management, bowel management, safety, skin/wound care, disease management, medication administration, pain management and patient education, does the patient require 24 hr/day rehab nursing? Yes 4. Does the patient require coordinated care of a physician, rehab nurse, PT (1-2 hrs/day, 5 days/week), OT (1-2 hrs/day, 5 days/week) and SLP (1-2 hrs/day, 5 days/week) to address physical and functional deficits in the context of the above medical diagnosis(es)? Yes Addressing deficits in the following areas: balance, endurance, locomotion, strength, transferring, bowel/bladder control, bathing, dressing, feeding, grooming, toileting, cognition, speech, swallowing and psychosocial support 5. Can the patient actively participate in an intensive therapy program of at least 3 hrs of therapy per day at least 5 days per week? Potentially 6. The potential for patient to make measurable gains while on inpatient rehab is excellent 7. Anticipated functional outcomes upon discharge from inpatient rehab are min assist  with PT, min assist with OT, min assist with SLP. 8. Estimated rehab length of stay to reach the above functional goals is: 16-19 days. 9. Anticipated D/C setting: Home 10. Anticipated post D/C treatments: HH therapy and Home excercise program 11. Overall Rehab/Functional Prognosis: good  RECOMMENDATIONS: This patient's condition is appropriate for continued rehabilitative care in the following setting: Will need to inquire about caregiver support at discharge as pt will require assistance.  Patient will also need to  be willing to stay in the hospital as he is currently stating that he wants to go home and prefers [facility] therapies. CIR after completion of medical workup.  If plan is for home d/c, recommend PM&R outpt follow up. Patient has agreed to participate in recommended program. Potentially Note that insurance prior authorization may be required for reimbursement for recommended care.  Comment: Rehab Admissions Coordinator to follow up.  Delice Lesch, MD, ABPMR Lavon Paganini Angiulli, PA-C 12/13/2017

## 2017-12-13 NOTE — Progress Notes (Signed)
Rehab admissions - I am following for potential acute inpatient rehab admission.  Patient asleep when I rounded.  I will follow up with patient and family in am.  Call me for questions.  #[phone]

## 2017-12-13 NOTE — Progress Notes (Signed)
Nutrition Follow-up  DOCUMENTATION CODES:   Obesity unspecified  INTERVENTION:  Continue Nepro @ 45 ml/hr (1080 ml/day) via Cortrak 30 ml Prostat BID  Provides: 2144 kcal, 117 grams protein, and 788 ml free water.   NDD1 - thin liquids per SLP  NUTRITION DIAGNOSIS:   Increased nutrient needs related to (ESRD on HD) as evidenced by estimated needs. -ongoing  GOAL:   Patient will meet greater than or equal to 90% of their needs -met with TF + PO  MONITOR:   TF tolerance, Labs, I & O's  ASSESSMENT:   Pt with PMH of ESRD on HD with T/R/Sa, remote bladder ca s/p chronic foley changed every month, GERD, CHF, DM, HLD, HTN admitted 2/23 after a fall on 2/22 with large R SDH with SAH s/p emergent craniotomy 2/24.   Continues to tolerate tubefeeding, also consumed some applesauce, and juice this AM PO.   Tentatively scheduled for conversion of L temporary dialysis cath to tunneled dialysis cath, L upper arm AV graft on Wednesday.  HD tomorrow per nephrology - UF 4L. Continues to be edematous  Labs reviewed Medications reviewed and include:  Insulin, Miralax,   Diet Order:  DIET - DYS 1 Room service appropriate? Yes; Fluid consistency: Thin  EDUCATION NEEDS:   Not appropriate for education at this time  Skin:  Skin Assessment: (head incision)  Last BM:  unknown  Height:   Ht Readings from Last 1 Encounters:  12/05/17 5' 9" (1.753 m)    Weight:   Wt Readings from Last 1 Encounters:  12/13/17 220 lb 7.4 oz (100 kg)    Ideal Body Weight:  72.7 kg  BMI:  Body mass index is 32.56 kg/m.  Estimated Nutritional Needs:   Kcal:  2100-2300  Protein:  110-125 grams  Fluid:  1.2 L/day  Satira Anis. Dreyden Rohrman, MS, RD LDN Inpatient Clinical Dietitian Pager [phone]

## 2017-12-13 NOTE — Progress Notes (Signed)
  Right  Upper Extremity Vein Map    Cephalic  Segment Diameter Depth Comment  1. Axilla 3.33mm 2.68mm   2. Mid upper arm 4.36mm 2.67mm   3. Above AC 2.10mm 2.68mm   4. In AC 5.24mm 1.2mm   5. Below AC 2.32mm 1.37mm   6. Mid forearm mm mm thrombosed  7. Wrist mm mm    mm mm    mm mm    mm mm    Basilic  Segment Diameter Depth Comment  1. Axilla mm mm   2. Mid upper arm 3.38mm 10.62mm origin  3. Above AC 3.64mm 9.31mm   4. In [facility] 3.35mm 5.77mm branch  5. Below AC 3.31mm 2.32mm   6. Mid forearm mm mm thrombosed  7. Wrist mm mm    mm mm    mm mm    mm mm     Left Upper Extremity Vein Map    Cephalic  Segment Diameter Depth Comment  1. Axilla 2.55mm 2.69mm   2. Mid upper arm 1.66mm 3.67mm branching  3. Above AC 2.70mm 2.53mm   4. In [facility] 3.64mm 6.37mm   5. Below AC 2.41mm 3.34mm branching  6. Mid forearm 2.6mm 2.57mm branching  7. Wrist 2.45mm 2.61mm     mm mm    mm mm    mm mm    Basilic  Segment Diameter Depth Comment  1. Axilla mm mm   2. Mid upper arm 1.66mm 2.51mm   3. Above AC mm mm   4. In AC mm mm   5. Below AC mm mm   6. Mid forearm mm mm   7. Wrist mm mm    mm mm    mm mm    mm mm

## 2017-12-13 NOTE — Evaluation (Signed)
Occupational Therapy Evaluation Patient Details Name: Albert Stanley MRN: [id number] DOB: 1946-08-09 Today's Date: 12/13/2017    History of Present Illness 72 y.o. male admitted for vomitting, left sided weakness, and a fall. Pt currently s/p crani for right SDH. PMH includes but not limited to HTN, HLD, DVT, ESRD, GERD, DM, CHF, bladder CA, and arthritis.   Clinical Impression   Pt admitted with above. He demonstrates the below listed deficits and will benefit from continued OT to maximize safety and independence with BADLs.  Pt presents to OT with Lt hemiparesis, impaired balance, impaired cognition, and impaired vision (intermittent diplopia), as well as generalized weakness,  He currently requires mod - total A for ADLs.  He lives with wife and daughter and was mod I PTA.  Family is very supportive.  Feel he would benefit from the consistency and the intensity of inpatient rehab to allow him to maximize his independence with ADLs, reduce risk of falls and readmission.  Will follow acutely.        Follow Up Recommendations  CIR;Supervision/Assistance - 24 hour    Equipment Recommendations  None recommended by OT    Recommendations for Other Services       Precautions / Restrictions Precautions Precautions: Fall Restrictions Weight Bearing Restrictions: No      Mobility Bed Mobility Overal bed mobility: Needs Assistance Bed Mobility: Supine to Sit;Sit to Supine     Supine to sit: Mod assist Sit to supine: Mod assist   General bed mobility comments: Pt requires assist to move LEs on and off bed and min A to lift trunk   Transfers Overall transfer level: Needs assistance   Transfers: Sit to/from Stand Sit to Stand: +2 physical assistance;+2 safety/equipment;From elevated surface;Total assist         General transfer comment: transfer from bed to recliner chair. multimodal cues for sequencing and attention to task. mod to max assist +2 for anterior translation and  boost up from bed    Balance Overall balance assessment: Needs assistance Sitting-balance support: Feet supported Sitting balance-Leahy Scale: Fair Sitting balance - Comments: Pt able to maintain EOB sitting x 15 mins with min guard assist      Standing balance-Leahy Scale: Zero                             ADL either performed or assessed with clinical judgement   ADL Overall ADL's : Needs assistance/impaired Eating/Feeding: Moderate assistance;Bed level;Sitting   Grooming: Wash/dry hands;Wash/dry face;Minimal assistance;Sitting   Upper Body Bathing: Moderate assistance;Sitting   Lower Body Bathing: Maximal assistance;Bed level   Upper Body Dressing : Moderate assistance;Sitting   Lower Body Dressing: Total assistance;Bed level   Toilet Transfer: Total assistance   Toileting- Clothing Manipulation and Hygiene: Bed level;Total assistance       Functional mobility during ADLs: Maximal assistance;+2 for physical assistance;+2 for safety/equipment       Vision Baseline Vision/History: Wears glasses Wears Glasses: Reading only Patient Visual Report: Diplopia Vision Assessment?: Yes Tracking/Visual Pursuits: Other (comment) Additional Comments: Pt self distracts frequently.  He is unable to sustain attention to complete pursuit testing accurately.  He reporrts diplopia with Rt gaze, worse in superior quadrant.  During testing, he denied diplopia in any other quadrant, however, later he states he sometimes sees two images on the TV, and he reported seeing two signs at the bottom of the bed      Perception Perception Perception Tested?: Yes  Praxis      Pertinent Vitals/Pain Pain Assessment: Faces Faces Pain Scale: Hurts little more Pain Location: Lt knee  Pain Descriptors / Indicators: Aching;Guarding Pain Intervention(s): Monitored during session     Hand Dominance Right   Extremity/Trunk Assessment Upper Extremity Assessment Upper Extremity  Assessment: RUE deficits/detail RUE Deficits / Details: Grossly 4/5  LUE Deficits / Details: Pt demonstrates strength grossly 3+/5.   He is slow to initiate movement with Lt UE  LUE Coordination: decreased fine motor;decreased gross motor   Lower Extremity Assessment Lower Extremity Assessment: Defer to PT evaluation   Cervical / Trunk Assessment Cervical / Trunk Assessment: Kyphotic;Other exceptions Cervical / Trunk Exceptions: maintains trunk in flexed position    Communication Communication Communication: No difficulties   Cognition Arousal/Alertness: Awake/alert Behavior During Therapy: WFL for tasks assessed/performed Overall Cognitive Status: Impaired/Different from baseline Area of Impairment: Attention;Memory;Following commands;Safety/judgement;Awareness;Problem solving;Orientation                 Orientation Level: Time Current Attention Level: Sustained;Selective Memory: Decreased short-term memory Following Commands: Follows one step commands consistently Safety/Judgement: Decreased awareness of safety;Decreased awareness of deficits Awareness: Intellectual Problem Solving: Slow processing;Difficulty sequencing;Requires verbal cues;Requires tactile cues General Comments: Pt perseverates on his medications and need to have those brought to hospital.  He frequently self distracts    General Comments  VSS throughout    Exercises Exercises: General Upper Extremity General Exercises - Upper Extremity Shoulder Flexion: AAROM;Left;10 reps;Seated Elbow Flexion: AROM;Left;10 reps;Seated Elbow Extension: AROM;10 reps;Seated Wrist Flexion: AROM;Left;10 reps;Seated   Shoulder Instructions      Home Living Family/patient expects to be discharged to:: Private residence Living Arrangements: Spouse/significant other Available Help at Discharge: Family;Available PRN/intermittently Type of Home: House Home Access: Stairs to enter [employer] of Steps:  5 Entrance Stairs-Rails: Right;Left Home Layout: Two level Alternate Level Stairs-Number of Steps: flight   Bathroom Shower/Tub: Occupational psychologist: Handicapped height     Home Equipment: Environmental consultant - 2 wheels;Crutches          Prior Functioning/Environment Level of Independence: Independent with assistive device(s)        Comments: Family reports pt does not drive.           OT Problem List: Decreased strength;Decreased range of motion;Decreased activity tolerance;Impaired balance (sitting and/or standing);Impaired vision/perception;Decreased coordination;Decreased cognition;Decreased safety awareness;Decreased knowledge of use of DME or AE;Impaired UE functional use;Pain      OT Treatment/Interventions: Self-care/ADL training;Neuromuscular education;Therapeutic exercise;DME and/or AE instruction;Therapeutic activities;Cognitive remediation/compensation;Patient/family education;Balance training;Visual/perceptual remediation/compensation    OT Goals(Current goals can be found in the care plan section) Acute Rehab OT Goals Patient Stated Goal: to go to rehab and regain independence  OT Goal Formulation: With patient Time For Goal Achievement: 12/27/17 Potential to Achieve Goals: Good ADL Goals Pt Will Perform Eating: with supervision;sitting Pt Will Perform Grooming: with supervision;sitting Pt Will Perform Upper Body Bathing: with supervision;sitting Pt Will Perform Lower Body Bathing: with mod assist;sit to/from stand Pt Will Perform Upper Body Dressing: with mod assist;sitting Pt Will Perform Lower Body Dressing: with max assist;sit to/from stand Pt Will Transfer to Toilet: with mod assist;stand pivot transfer;bedside commode Pt Will Perform Toileting - Clothing Manipulation and hygiene: with mod assist;sit to/from stand Pt/caregiver will Perform Home Exercise Program: Left upper extremity;With Supervision;With written HEP provided  OT Frequency: Min 2X/week    Barriers to D/C:            Co-evaluation  AM-PAC PT "6 Clicks" Daily Activity     Outcome Measure Help from another person eating meals?: A Lot Help from another person taking care of personal grooming?: A Lot Help from another person toileting, which includes using toliet, bedpan, or urinal?: Total Help from another person bathing (including washing, rinsing, drying)?: A Lot Help from another person to put on and taking off regular upper body clothing?: A Lot Help from another person to put on and taking off regular lower body clothing?: Total 6 Click Score: 10   End of Session Nurse Communication: Mobility status  Activity Tolerance: Patient tolerated treatment well Patient left: in bed;with call bell/phone within reach;with family/visitor present  OT Visit Diagnosis: Unsteadiness on feet (R26.81);Cognitive communication deficit (R41.841);Muscle weakness (generalized) (M62.81) Symptoms and signs involving cognitive functions: (SDH )                Time: 2111-5520 OT Time Calculation (min): 45 min Charges:  OT General Charges $OT Visit: 1 Visit OT Evaluation $OT Eval Moderate Complexity: 1 Mod OT Treatments $Neuromuscular Re-education: 23-37 mins G-Codes:     [facility], OTR/L [phone]   Lucille Passy M 12/13/2017, 6:22 PM

## 2017-12-13 NOTE — Progress Notes (Signed)
Physical Therapy Treatment Patient Details Name: Albert Stanley MRN: [id number] DOB: 1946-01-18 Today's Date: 12/13/2017    History of Present Illness 72 y.o. male admitted for vomitting, left sided weakness, and a fall. Pt currently s/p crani for right SDH. PMH includes but not limited to HTN, HLD, DVT, ESRD, GERD, DM, CHF, bladder CA, and arthritis.    PT Comments    Pt progressing towards functional mobility goals. Pt continues to require physical assist for bed mobility and transfers. He demonstrates decreased sitting balance including posterior lean with left lateral bias. Pt with decreased awareness and attention requiring continuous multimodal cues at this time. Will continue to follow acutely and progress as tolerated to maximize functional mobility, safety, and independence prior to d/c.    Follow Up Recommendations  CIR;Supervision/Assistance - 24 hour     Equipment Recommendations  Other (comment)(TBA at next venue of care)    Recommendations for Other Services Rehab consult     Precautions / Restrictions Precautions Precautions: Fall Restrictions Weight Bearing Restrictions: No    Mobility  Bed Mobility Overal bed mobility: Needs Assistance Bed Mobility: Supine to Sit     Supine to sit: Max assist     General bed mobility comments: multimodal cues for sequencing, max assist to elevate trunk, pushing posteriorly, and to rotate and scoot hips to EOB using bed pad. mod assist to bring LEs off bed  Transfers Overall transfer level: Needs assistance   Transfers: Sit to/from Stand Sit to Stand: +2 physical assistance;+2 safety/equipment;From elevated surface;Total assist  With stedy         General transfer comment: transfer from bed to recliner chair. multimodal cues for sequencing and attention to task. mod to max assist +2 for anterior translation and boost up from bed  Ambulation/Gait             General Gait Details: unable at this  time   Stairs            Wheelchair Mobility    Modified Rankin (Stroke Patients Only) Modified Rankin (Stroke Patients Only) Pre-Morbid Rankin Score: No symptoms Modified Rankin: Severe disability     Balance Overall balance assessment: Needs assistance Sitting-balance support: Feet supported;Bilateral upper extremity supported Sitting balance-Leahy Scale: Poor Sitting balance - Comments: bil UE support required to maintain balance. unable to maintian upright at midline.  displays posterior lean with left lateral bias     Standing balance-Leahy Scale: Zero                              Cognition Arousal/Alertness: Awake/alert Behavior During Therapy: Flat affect Overall Cognitive Status: Impaired/Different from baseline Area of Impairment: Attention;Memory;Following commands;Safety/judgement;Awareness;Problem solving;Orientation                 Orientation Level: Time Current Attention Level: Sustained Memory: Decreased recall of precautions;Decreased short-term memory Following Commands: Follows one step commands with increased time Safety/Judgement: Decreased awareness of safety;Decreased awareness of deficits Awareness: Emergent Problem Solving: Slow processing;Decreased initiation;Difficulty sequencing;Requires verbal cues;Requires tactile cues General Comments: pt requiring increased time and multimodal cues to follow tasks      Exercises      General Comments General comments (skin integrity, edema, etc.): VSS throughout      Pertinent Vitals/Pain Pain Assessment: Faces Faces Pain Scale: Hurts a little bit Pain Location: "everywhere" Pain Descriptors / Indicators: (none stated) Pain Intervention(s): Monitored during session;Repositioned;Limited activity within patient's tolerance    Home Living  Prior Function            PT Goals (current goals can now be found in the care plan section) Acute Rehab  PT Goals Patient Stated Goal: none stated PT Goal Formulation: Patient unable to participate in goal setting Time For Goal Achievement: 12/24/17 Potential to Achieve Goals: Good Progress towards PT goals: Progressing toward goals    Frequency    Min 3X/week      PT Plan Current plan remains appropriate    Co-evaluation              AM-PAC PT "6 Clicks" Daily Activity  Outcome Measure  Difficulty turning over in bed (including adjusting bedclothes, sheets and blankets)?: Unable Difficulty moving from lying on back to sitting on the side of the bed? : Unable Difficulty sitting down on and standing up from a chair with arms (e.g., wheelchair, bedside commode, etc,.)?: Unable Help needed moving to and from a bed to chair (including a wheelchair)?: Total Help needed walking in hospital room?: Total Help needed climbing 3-5 steps with a railing? : Total 6 Click Score: 6    End of Session Equipment Utilized During Treatment: Gait belt;Other (comment)(stedy) Activity Tolerance: Patient tolerated treatment well Patient left: in chair;with call bell/phone within reach;with chair alarm set Nurse Communication: Mobility status PT Visit Diagnosis: Hemiplegia and hemiparesis;Other abnormalities of gait and mobility (R26.89);Muscle weakness (generalized) (M62.81) Hemiplegia - Right/Left: Left Hemiplegia - dominant/non-dominant: Non-dominant Hemiplegia - caused by: Other Nontraumatic intracranial hemorrhage     Time: 2355-7322 PT Time Calculation (min) (ACUTE ONLY): 33 min  Charges:  $Therapeutic Activity: 23-37 mins                    G Codes:       Vic Ripper, SPT  Vic Ripper 12/13/2017, 3:13 PM

## 2017-12-13 NOTE — Progress Notes (Signed)
   Daily Progress Note   Pt tentatively scheduled for Wednesday, Conversion of left temporary dialysis catheter to tunneled dialysis catheter, left upper arm arteriovenous graft    Will need to complete informed consent with wife as pt unable to give consent.   Adele Barthel, MD, FACS [facility] Office: [phone] Pager: [phone]  12/13/2017, 10:57 AM

## 2017-12-13 NOTE — Progress Notes (Signed)
Patient ID: Albert Stanley, male   DOB: 1946-10-05, 72 y.o.   MRN: [id number] BP 118/66 (BP Location: Right Arm)   Pulse 79   Temp 98.5 F (36.9 C) (Axillary)   Resp 18   Ht 5\' 9"  (1.753 m)   Wt 100 kg (220 lb 7.4 oz)   SpO2 100%   BMI 32.56 kg/m  Patient awake and alert. Follows commands The family wants inpatient rehabilitation, they do not want home health therapies. Wound is clean, dry, no signs of infection Moving all extremities Vascular for procedure Wednesday Neuro is stable awaiting placement

## 2017-12-14 ENCOUNTER — Encounter (HOSPITAL_COMMUNITY)
Admission: EM | Disposition: A | Discharge status: Rehabilitation Facility | Payer: Self-pay | Source: Home / Self Care | Attending: Neurosurgery

## 2017-12-14 LAB — CBC
HEMATOCRIT: 22.4 % — AB (ref 39.0–52.0)
HEMOGLOBIN: 7.5 g/dL — AB (ref 13.0–17.0)
MCH: 31 pg (ref 26.0–34.0)
MCHC: 33.5 g/dL (ref 30.0–36.0)
MCV: 92.6 fL (ref 78.0–100.0)
Platelets: 194 10*3/uL (ref 150–400)
RBC: 2.42 MIL/uL — ABNORMAL LOW (ref 4.22–5.81)
RDW: 18 % — AB (ref 11.5–15.5)
WBC: 7.8 10*3/uL (ref 4.0–10.5)

## 2017-12-14 LAB — GLUCOSE, CAPILLARY
GLUCOSE-CAPILLARY: 198 mg/dL — AB (ref 65–99)
GLUCOSE-CAPILLARY: 94 mg/dL (ref 65–99)
Glucose-Capillary: 186 mg/dL — ABNORMAL HIGH (ref 65–99)
Glucose-Capillary: 114 mg/dL — ABNORMAL HIGH (ref 65–99)
Glucose-Capillary: 136 mg/dL — ABNORMAL HIGH (ref 65–99)

## 2017-12-14 LAB — RENAL FUNCTION PANEL
ALBUMIN: 2.4 g/dL — AB (ref 3.5–5.0)
ANION GAP: 15 (ref 5–15)
BUN: 84 mg/dL — ABNORMAL HIGH (ref 6–20)
CO2: 26 mmol/L (ref 22–32)
Calcium: 7.9 mg/dL — ABNORMAL LOW (ref 8.9–10.3)
Chloride: 90 mmol/L — ABNORMAL LOW (ref 101–111)
Creatinine, Ser: 7.49 mg/dL — ABNORMAL HIGH (ref 0.61–1.24)
GFR calc Af Amer: 7 mL/min — ABNORMAL LOW (ref >60)
GFR, EST NON AFRICAN AMERICAN: 6 mL/min — AB (ref >60)
Glucose, Bld: 87 mg/dL (ref 65–99)
PHOSPHORUS: 6.1 mg/dL — AB (ref 2.5–4.6)
Potassium: 4.2 mmol/L (ref 3.5–5.1)
Sodium: 131 mmol/L — ABNORMAL LOW (ref 135–145)

## 2017-12-14 SURGERY — INSERTION OF ARTERIOVENOUS (AV) GORE-TEX GRAFT ARM
Anesthesia: General | Laterality: Left

## 2017-12-14 MED ORDER — ALLOPURINOL 100 MG PO TABS
300.0000 mg | ORAL_TABLET | Freq: Every day | ORAL | Status: DC
  Administered 2017-12-15 – 2017-12-16 (×2): 300 mg via ORAL
  Filled 2017-12-14 (×2): qty 3

## 2017-12-14 MED ORDER — HEPARIN SODIUM (PORCINE) 1000 UNIT/ML DIALYSIS
7000.0000 [IU] | Freq: Once | INTRAMUSCULAR | Status: AC
  Administered 2017-12-16: 7000 [IU] via INTRAVENOUS_CENTRAL

## 2017-12-14 MED ORDER — RENA-VITE PO TABS
1.0000 | ORAL_TABLET | Freq: Every day | ORAL | Status: DC
  Administered 2017-12-14: 1 via ORAL
  Filled 2017-12-14: qty 1

## 2017-12-14 MED ORDER — DIPHENHYDRAMINE HCL 25 MG PO TABS
25.0000 mg | ORAL_TABLET | Freq: Two times a day (BID) | ORAL | Status: DC | PRN
  Filled 2017-12-14: qty 1

## 2017-12-14 MED ORDER — AMIODARONE HCL 200 MG PO TABS
200.0000 mg | ORAL_TABLET | Freq: Every day | ORAL | Status: DC
  Administered 2017-12-14 – 2017-12-16 (×3): 200 mg via ORAL
  Filled 2017-12-14 (×3): qty 1

## 2017-12-14 MED ORDER — CALCITRIOL 0.5 MCG PO CAPS
0.5000 ug | ORAL_CAPSULE | ORAL | Status: DC
  Administered 2017-12-16: 0.5 ug via ORAL
  Filled 2017-12-14: qty 1

## 2017-12-14 MED ORDER — WHITE PETROLATUM EX OINT
TOPICAL_OINTMENT | CUTANEOUS | Status: AC
  Administered 2017-12-14: 12:00:00
  Filled 2017-12-14: qty 28.35

## 2017-12-14 MED ORDER — SEVELAMER CARBONATE 800 MG PO TABS
2400.0000 mg | ORAL_TABLET | Freq: Three times a day (TID) | ORAL | Status: DC
  Administered 2017-12-16 (×2): 2400 mg via ORAL
  Filled 2017-12-14 (×2): qty 3

## 2017-12-14 NOTE — H&P (View-Only) (Signed)
   Daily Progress Note   Assessment/Planning:   ESRD, s/p R craniotomy for SDH   Essential resolution of AMS this AM  Due to cancellation, we discussed possibly doing his access today.   Patient would prefer to go in the AM tomorrow, so will hold on today.  Pt L BC AVF is occluded, so I'm not certain what cephalic vein was mapped.  Preop for tomorrow L arm AVG   Subjective  - 9 Days Post-Op   No complaints   Objective   Vitals:   12/14/17 0000 12/14/17 0400 12/14/17 0600 12/14/17 0746  BP: 133/83 138/82  136/80  Pulse: 81 77  82  Resp: 18 18  18   Temp: 98.3 F (36.8 C) 98.1 F (36.7 C)  98.8 F (37.1 C)  TempSrc: Oral Axillary  Oral  SpO2: 97% 98%  99%  Weight:   224 lb 13.9 oz (102 kg)   Height:         Intake/Output Summary (Last 24 hours) at 12/14/2017 0752 Last data filed at 12/13/2017 1900 Gross per 24 hour  Intake 510 ml  Output 30 ml  Net 480 ml   GEN Nasal feeding tube LUE Pulsatile distal cephalic vein, no thrill or bruit  Laboratory   CBC CBC Latest Ref Rng & Units 12/11/2017 12/10/2017 12/09/2017  WBC 4.0 - 10.5 K/uL 8.2 11.2(H) 13.6(H)  Hemoglobin 13.0 - 17.0 g/dL 7.4(L) 7.4(L) 7.4(L)  Hematocrit 39.0 - 52.0 % 22.4(L) 22.0(L) 22.2(L)  Platelets 150 - 400 K/uL 154 170 146(L)    BMET    Component Value Date/Time   NA 134 (L) 12/12/2017 0931   K 3.6 12/12/2017 0931   CL 95 (L) 12/12/2017 0931   CO2 25 12/12/2017 0931   GLUCOSE 97 12/12/2017 0931   BUN 45 (H) 12/12/2017 0931   CREATININE 4.79 (H) 12/12/2017 0931   CALCIUM 8.3 (L) 12/12/2017 0931   CALCIUM 8.6 10/29/2011 1000   GFRNONAA 11 (L) 12/12/2017 0931   GFRAA 13 (L) 12/12/2017 0931     Adele Barthel, MD, FACS [facility] Office: [phone] Pager: [phone]  12/14/2017, 7:52 AM

## 2017-12-14 NOTE — Progress Notes (Signed)
Patient ID: Albert Stanley, male   DOB: 02/11/1946, 72 y.o.   MRN: [id number] BP 115/69 (BP Location: Right Arm)   Pulse 75   Temp 98.4 F (36.9 C) (Oral)   Resp 18   Ht 5\' 9"  (1.753 m)   Wt 102 kg (224 lb 13.9 oz)   SpO2 96%   BMI 33.21 kg/m  Alert and oriented x 4 Speech is fluent Mild slurring Moving all extremities, weak on left side Or tomorrow for graft

## 2017-12-14 NOTE — Care Management Note (Signed)
Case Management Note  Patient Details  Name: Albert Stanley MRN: [id number] Date of Birth: [birth year]  Subjective/Objective:                    Action/Plan: Pt to have L arm AVG tomorrow. Plan is for CIR once patient is medically ready. CM following.    Expected Discharge Date:                  Expected Discharge Plan:     In-House Referral:     Discharge planning Services     Post Acute Care Choice:    Choice offered to:     DME Arranged:    DME Agency:     HH Arranged:    HH Agency:     Status of Service:     If discussed at [employer], dates discussed:    Additional Comments:  Pollie Friar, RN 12/14/2017, 3:12 PM

## 2017-12-14 NOTE — Anesthesia Preprocedure Evaluation (Addendum)
Anesthesia Evaluation  Patient identified by MRN, date of birth, ID band Patient awake    Reviewed: Allergy & Precautions, H&P , NPO status , Patient's Chart, lab work & pertinent test results, reviewed documented beta blocker date and time   Airway Mallampati: III  TM Distance: >3 FB Neck ROM: Full    Dental  (+) Partial Upper, Dental Advisory Given   Pulmonary asthma , former smoker,    breath sounds clear to auscultation       Cardiovascular Exercise Tolerance: Good hypertension, Pt. on medications and On Home Beta Blockers +CHF   Rhythm:Regular Rate:Normal     Neuro/Psych negative neurological ROS  negative psych ROS   GI/Hepatic Neg liver ROS, GERD  ,  Endo/Other  diabetes, Type 2, Oral Hypoglycemic Agents  Renal/GU CRF and DialysisRenal disease  negative genitourinary   Musculoskeletal  (+) Arthritis , Osteoarthritis,    Abdominal   Peds  Hematology negative hematology ROS (+) anemia ,   Anesthesia Other Findings   Reproductive/Obstetrics negative OB ROS                           Anesthesia Physical Anesthesia Plan  ASA: III  Anesthesia Plan: MAC   Post-op Pain Management:    Induction: Intravenous  PONV Risk Score and Plan: 2 and Propofol infusion and Ondansetron  Airway Management Planned: Simple Face Mask  Additional Equipment:   Intra-op Plan:   Post-operative Plan:   Informed Consent: I have reviewed the patients History and Physical, chart, labs and discussed the procedure including the risks, benefits and alternatives for the proposed anesthesia with the patient or authorized representative who has indicated his/her understanding and acceptance.   Dental advisory given  Plan Discussed with: CRNA  Anesthesia Plan Comments:         Anesthesia Quick Evaluation

## 2017-12-14 NOTE — H&P (Signed)
Physical Medicine and Rehabilitation Admission H&P    Chief complaint: Weakness  HPI: Albert Stanley is a 72 y.o. right handed male with history of bladder cancer with resection in 1999 lymphedema, end-stage renal disease with hemodialysis, diastolic congestive heart failure, diabetes mellitus, hypertension, nonischemic cardiomyopathy. History taken from chart review. Patient lives with spouse and daughter. Independent with assistive device prior to admission. Independent ADLs and still driving. Wife works during the day. His daughter is in and out during the day. Presented 12/05/2017 with fatigue, vomiting and diarrhea 2 days as well as headache. He had missed his last dialysis session. Noted fall 12/03/2017 when he tripped on the carpet. Denied striking his head. Cranial CT reviewed showing right SDH.  Per report, large right side subdural hematoma measuring 16 mm in width with significant mass effect and a 13 mm right to left midline shift. Mass effect effaces the sulci and right lateral ventricle. Uncal herniation present. Neurosurgery consulted underwent right frontotemporal parietal craniotomy hematoma evacuation 12/05/2017 per Dr. Cyndy Freeze. Hospital course hemodialysis ongoing with conversion of left temporary dialysis catheter to tunneled dialysis catheter after his left BC AVF thrombosed 12/15/2017 per Dr. Trula Slade. Intermittent bouts of restlessness maintained on Seroquel. Acute on chronic anemia 7.5 and monitored . Dysphagia #1 thin liquid diet. Nasogastric tube remains in place. MRSA PCR screening positive maintained on contact precautions. Physical and occupational therapy evaluations completed with recommendations of physical medicine rehabilitation consult. Patient was admitted for a comprehensive rehabilitation program    Review of Systems  Constitutional: Positive for malaise/fatigue. Negative for fever.  HENT: Negative for hearing loss.   Eyes: Negative for blurred vision and  double vision.  Respiratory: Positive for shortness of breath.   Cardiovascular: Positive for leg swelling.  Gastrointestinal: Positive for diarrhea and nausea.       GERD  Genitourinary: Negative for flank pain and hematuria.  Musculoskeletal: Positive for falls and myalgias.  Skin: Negative for rash.  Neurological: Positive for headaches. Negative for seizures.  All other systems reviewed and are negative.  Past Medical History:  Diagnosis Date  . Arthritis    Osteoarthritis  . Asthma   . Bladder cancer ([place]) dx'd 1990   surg only  . CHF (congestive heart failure) ([place])   . Diabetes mellitus without complication ([place])   . ESRD (end stage renal disease) ([name])    Tues, Thurs, Sat dialysis  . GERD (gastroesophageal reflux disease)   . History of cardiac catheterization    a. LHC 5/17: no obstructive CAD  . History of DVT (deep vein thrombosis)    2003 ish  . History of nuclear stress test    a. Myoview 4/17: EF 39%, inf, inf-lat, apical inf, apical lat, apical scar, intermediate risk  . Hyperlipidemia   . Hypertension   . NICM (nonischemic cardiomyopathy) ([place])    a. Echo 2/17: mod LVH, EF 35-40%, inf-lat and inf-sept HK, mod MR, severe LAE, small pericardial effusion   Past Surgical History:  Procedure Laterality Date  . arthroscopic knee surgery Left   . AV FISTULA PLACEMENT Left 12/13/2015   Procedure: ARTERIOVENOUS (AV) FISTULA CREATION;  Surgeon: Angelia Mould, MD;  Location: [facility];  Service: Vascular;  Laterality: Left;  . BACK SURGERY     2 times  1960  . CARDIAC CATHETERIZATION N/A 02/28/2016   Procedure: Left Heart Cath and Coronary Angiography;  Surgeon: Larey Dresser, MD;  Location: [facility];  Service: Cardiovascular;  Laterality: N/A;  . CRANIOTOMY  N/A 12/05/2017   Procedure: CRANIOTOMY HEMATOMA EVACUATION SUBDURAL;  Surgeon: Ashok Pall, MD;  Location: [facility];  Service: Neurosurgery;  Laterality: N/A;  . CYSTOSCOPY    . FISTULA  SUPERFICIALIZATION Left 08/14/2016   Procedure: FISTULA SUPERFICIALIZATION LEFT UPPER ARM;  Surgeon: Angelia Mould, MD;  Location: [facility];  Service: Vascular;  Laterality: Left;  . INSERTION OF DIALYSIS CATHETER N/A 12/13/2015   Procedure: INSERTION OF DIALYSIS CATHETER;  Surgeon: Angelia Mould, MD;  Location: [facility];  Service: Vascular;  Laterality: N/A;  . INSERTION OF DIALYSIS CATHETER Left 05/07/2017   Procedure: INSERTION OF DIALYSIS CATHETER LEFT INTERNAL JUGULAR PLACEMENT;  Surgeon: Angelia Mould, MD;  Location: [facility];  Service: Vascular;  Laterality: Left;  . IR RADIOLOGIST EVAL & MGMT  05/12/2017  . JOINT REPLACEMENT Left    hip  . LIGATION OF COMPETING BRANCHES OF ARTERIOVENOUS FISTULA Left 08/14/2016   Procedure: LIGATION OF COMPETING BRANCHES OF ARTERIOVENOUS FISTULA LEFT UPPER ARM;  Surgeon: Angelia Mould, MD;  Location: [facility];  Service: Vascular;  Laterality: Left;  . PERIPHERAL VASCULAR CATHETERIZATION Left 06/09/2016   Procedure: Fistulagram;  Surgeon: Serafina Mitchell, MD;  Location: [facility];  Service: Cardiovascular;  Laterality: Left;  ARM  . PERIPHERAL VASCULAR CATHETERIZATION Left 06/09/2016   Procedure: Peripheral Vascular Balloon Angioplasty;  Surgeon: Serafina Mitchell, MD;  Location: [facility];  Service: Cardiovascular;  Laterality: Left;  upper arm venous  . PERIPHERAL VASCULAR CATHETERIZATION Left 06/25/2016   Procedure: Fistulagram;  Surgeon: Serafina Mitchell, MD;  Location: [facility];  Service: Cardiovascular;  Laterality: Left;  . REVISON OF ARTERIOVENOUS FISTULA Left 05/07/2017   Procedure: REPAIR OF PSEUDOANEURYSM  LEFT BRACHIO-CEPHALIC  ARM ARTERIOVENOUS FISTULA;  Surgeon: Angelia Mould, MD;  [facility];  Service: Vascular;  Laterality: Left;   Family History  Problem Relation Age of Onset  . Arthritis Mother   . Cancer Father   . Heart disease Father   . Hypertension Sister   . Alcohol abuse Brother    . Diabetes Daughter   . Heart attack Paternal Uncle    Social History:  reports that he quit smoking about 19 years ago. He quit after 30.00 years of use. he has never used smokeless tobacco. He reports that he does not drink alcohol or use drugs. Allergies:  Allergies  Allergen Reactions  . Meperidine Shortness Of Breath and Other (See Comments)    Increased heart rate Reaction to demerol  . Sulfa Antibiotics Shortness Of Breath and Other (See Comments)    Increased heart rate  . Sulfonamide Derivatives Shortness Of Breath and Other (See Comments)    Increased heart rate  . Eggs Or Egg-Derived Products Nausea And Vomiting     Reaction to egg yolks - not whites    . Foltx [Elfolate Plus] Other (See Comments)    Pt does not recall reaction to this  . [place] Diarrhea, Nausea And Vomiting and Other (See Comments)    Constipation Reaction to whole milk  . Lactose Intolerance (Gi) Diarrhea, Nausea And Vomiting and Other (See Comments)    Constipation Reaction to whole milk  . Milk-Related Compounds Diarrhea, Nausea And Vomiting and Other (See Comments)    Constipation Reaction to whole milk  . Penicillins Other (See Comments)    "Knocks me out."   Has patient had a PCN reaction causing immediate rash, facial/tongue/throat swelling, SOB or lightheadedness with hypotension: yes- blacked out Has patient  had a PCN reaction causing severe rash involving mucus membranes or skin necrosis: unknown Has patient had a PCN reaction that required hospitalization: at MD office Has patient had a PCN reaction occurring within the last 10 years: no If all of the above answers are "NO", then may proceed with Cephalosporin use.   . Betadine [Povidone Iodine] Itching, Rash and Other (See Comments)    burning   Medications Prior to Admission  Medication Sig Dispense Refill  . acetaminophen (TYLENOL) 500 MG tablet Take 1,000 mg by mouth every 6 (six) hours as needed for mild pain.    Marland Kitchen  albuterol (PROVENTIL HFA;VENTOLIN HFA) 108 (90 Base) MCG/ACT inhaler Inhale 1-2 puffs into the lungs every 6 (six) hours as needed for wheezing or shortness of breath. 1 Inhaler 0  . allopurinol (ZYLOPRIM) 300 MG tablet Take 300 mg by mouth daily.     Marland Kitchen amiodarone (PACERONE) 200 MG tablet Take 1 tablet (200 mg total) by mouth daily. 30 tablet 3  . calcitRIOL (ROCALTROL) 0.5 MCG capsule Take 0.5 mcg by mouth Every Tuesday,Thursday,and Saturday with dialysis. Medication given at dialysis on Tues Thurs Sat  1  . diphenhydrAMINE (BENADRYL) 25 MG tablet Take 25 mg by mouth 2 (two) times daily as needed for itching or allergies.     Marland Kitchen glipiZIDE (GLUCOTROL) 5 MG tablet Take 0.5 tablets (2.5 mg total) by mouth 2 (two) times daily. (Patient taking differently: Take 2.5 mg by mouth See admin instructions. Take as needed...one tablet in the morning if BS is greater than 80.)    . Menthol, Topical Analgesic, (ICY HOT EX) Apply 1 application topically 3 (three) times daily as needed (pain).    . multivitamin (RENA-VIT) TABS tablet Take 1 tablet by mouth at bedtime.   0  . oxyCODONE-acetaminophen (PERCOCET) 7.5-325 MG tablet Take 1 tablet by mouth 3 (three) times daily as needed (pain). (Patient taking differently: Take 1 tablet by mouth 4 (four) times daily as needed (pain). ) 20 tablet 0  . sevelamer carbonate (RENVELA) 800 MG tablet Take 2 tablets (1,600 mg total) by mouth 3 (three) times daily with meals. (Patient taking differently: Take 2,400 mg by mouth See admin instructions. Take 3 tablets (2400 mg) by mouth daily with meals - usually twice daily) 90 tablet 0  . carvedilol (COREG) 3.125 MG tablet Take 1 tablet (3.125 mg total) by mouth See admin instructions. Take 1 tablet (3.125 mg) by mouth twice daily on Monday, Wednesday, Friday and Sunday (non-dialysis days) (Patient not taking: Reported on 12/04/2017)    . famotidine (PEPCID) 20 MG tablet Take 1 tablet (20 mg total) by mouth 2 (two) times daily as needed  (sour stomach). (Patient not taking: Reported on 12/04/2017)      Drug Regimen Review Drug regimen was reviewed and remains appropriate with no significant issues identified  Home: Home Living Family/patient expects to be discharged to:: Private residence Living Arrangements: Spouse/significant other Available Help at Discharge: Family, Available PRN/intermittently Type of Home: House Home Access: Stairs to enter [employer] of Steps: 5 Entrance Stairs-Rails: Right, Left Home Layout: Two level Alternate Level Stairs-Number of Steps: flight Alternate Level Stairs-Rails: (chair lift ) Bathroom Shower/Tub: Multimedia programmer: Handicapped height Home Equipment: Environmental consultant - 2 wheels, Crutches   Functional History: Prior Function Level of Independence: Independent with assistive device(s) Comments: Family reports pt does not drive.     Functional Status:  Mobility: Bed Mobility Overal bed mobility: Needs Assistance Bed Mobility: Supine to Sit, Sit to  Supine Rolling: Mod assist Sidelying to sit: Max assist Supine to sit: Mod assist Sit to supine: Mod assist General bed mobility comments: Pt requires assist to move LEs on and off bed and min A to lift trunk  Transfers Overall transfer level: Needs assistance Transfer via Lift Equipment: Stedy Transfers: Sit to/from Stand Sit to Stand: +2 physical assistance, +2 safety/equipment, From elevated surface, Total assist  Lateral/Scoot Transfers: Max assist, From elevated surface General transfer comment: transfer from bed to recliner chair. multimodal cues for sequencing and attention to task. mod to max assist +2 for anterior translation and boost up from bed Ambulation/Gait General Gait Details: unable at this time    ADL: ADL Overall ADL's : Needs assistance/impaired Eating/Feeding: Moderate assistance, Bed level, Sitting Grooming: Wash/dry hands, Wash/dry face, Minimal assistance, Sitting Upper Body  Bathing: Moderate assistance, Sitting Lower Body Bathing: Maximal assistance, Bed level Upper Body Dressing : Moderate assistance, Sitting Lower Body Dressing: Total assistance, Bed level Toilet Transfer: Total assistance Toileting- Clothing Manipulation and Hygiene: Bed level, Total assistance Functional mobility during ADLs: Maximal assistance, +2 for physical assistance, +2 for safety/equipment  Cognition: Cognition Overall Cognitive Status: Impaired/Different from baseline Orientation Level: Oriented to person, Oriented to place, Oriented to situation, Disoriented to time Cognition Arousal/Alertness: Awake/alert Behavior During Therapy: WFL for tasks assessed/performed Overall Cognitive Status: Impaired/Different from baseline Area of Impairment: Attention, Memory, Following commands, Safety/judgement, Awareness, Problem solving, Orientation Orientation Level: Time Current Attention Level: Sustained, Selective Memory: Decreased short-term memory Following Commands: Follows one step commands consistently Safety/Judgement: Decreased awareness of safety, Decreased awareness of deficits Awareness: Intellectual Problem Solving: Slow processing, Difficulty sequencing, Requires verbal cues, Requires tactile cues General Comments: Pt perseverates on his medications and need to have those brought to hospital.  He frequently self distracts   Physical Exam: Blood pressure 138/82, pulse 77, temperature 98.1 F (36.7 C), temperature source Axillary, resp. rate 18, height '5\' 9"'  (1.753 m), weight 102 kg (224 lb 13.9 oz), SpO2 98 %.  Constitutional. Patient is alert. Provides person place and date of birth. HEENT. Craniotomy site clean and dry Eyes. Pupils round and reactive to light without discharge Neck: Normal range of motion. Neck supple. No thyromegaly present.  Cardiovascular: RRR without murmur. No JVD   Respiratory: CTA Bilaterally without wheezes or rales. Normal effort  GI: Soft.  Bowel sounds are normal. He exhibits no distension Skin. Left artery graft site with some increased swelling nontender Neurological: He is alert and oriented to person, place, and time.  Follow simple commands. Some delays in processing.  Motor: RUE: 5/5 proximal to distal LUE: 4 to 4/5 proximal to distal RLE: 4+ to 5/5 proximal to distal LLE: HF 2/5, KE 2+/5, ADF 4-/5 Decreased LT distal LE's Psych: affect flat, cooperative    Results for orders placed or performed during the hospital encounter of 12/05/17 (from the past 48 hour(s))  Renal function panel     Status: Abnormal   Collection Time: 12/12/17  9:31 AM  Result Value Ref Range   Sodium 134 (L) 135 - 145 mmol/L   Potassium 3.6 3.5 - 5.1 mmol/L    Comment: DELTA CHECK NOTED   Chloride 95 (L) 101 - 111 mmol/L   CO2 25 22 - 32 mmol/L   Glucose, Bld 97 65 - 99 mg/dL   BUN 45 (H) 6 - 20 mg/dL   Creatinine, Ser 4.79 (H) 0.61 - 1.24 mg/dL   Calcium 8.3 (L) 8.9 - 10.3 mg/dL   Phosphorus 5.0 (H) 2.5 -  4.6 mg/dL   Albumin 2.6 (L) 3.5 - 5.0 g/dL   GFR calc non Af Amer 11 (L) >60 mL/min   GFR calc Af Amer 13 (L) >60 mL/min    Comment: (NOTE) The eGFR has been calculated using the CKD EPI equation. This calculation has not been validated in all clinical situations. eGFR's persistently <60 mL/min signify possible Chronic Kidney Disease.    Anion gap 14 5 - 15    Comment: Performed at [facility] [address], [place], [place] [address]  Glucose, capillary     Status: Abnormal   Collection Time: 12/12/17  1:39 PM  Result Value Ref Range   Glucose-Capillary 188 (H) 65 - 99 mg/dL  Glucose, capillary     Status: Abnormal   Collection Time: 12/12/17  7:46 PM  Result Value Ref Range   Glucose-Capillary 121 (H) 65 - 99 mg/dL   Comment 1 Notify RN    Comment 2 Document in Chart   Glucose, capillary     Status: Abnormal   Collection Time: 12/13/17 12:08 AM  Result Value Ref Range   Glucose-Capillary 128 (H) 65 - 99 mg/dL    Comment 1 Notify RN    Comment 2 Document in Chart   Glucose, capillary     Status: Abnormal   Collection Time: 12/13/17  4:11 AM  Result Value Ref Range   Glucose-Capillary 169 (H) 65 - 99 mg/dL   Comment 1 Notify RN    Comment 2 Document in Chart   Glucose, capillary     Status: Abnormal   Collection Time: 12/13/17  7:59 AM  Result Value Ref Range   Glucose-Capillary 193 (H) 65 - 99 mg/dL  Glucose, capillary     Status: Abnormal   Collection Time: 12/13/17 11:38 AM  Result Value Ref Range   Glucose-Capillary 194 (H) 65 - 99 mg/dL  Prepare RBC     Status: None   Collection Time: 12/13/17 12:05 PM  Result Value Ref Range   Order Confirmation      ORDER PROCESSED BY BLOOD BANK Performed at [facility], [place] [name], [place], [place] [address]   Type and screen [place]     Status: None (Preliminary result)   Collection Time: 12/13/17 12:05 PM  Result Value Ref Range   ABO/RH(D) O POS    Antibody Screen POS    Sample Expiration 12/16/2017    DAT, IgG NEG    Antibody Identification HIGH TITER LOW AVIDITY ANTIBODY PRESENT    Unit Number [id number]    Blood Component Type RED CELLS,LR    Unit division 00    Status of Unit ALLOCATED    Unit tag comment VERBAL ORDERS PER DR PATRICK    Transfusion Status OK TO TRANSFUSE    Crossmatch Result LEAST INCOMPATIBLE    Unit Number [id number]    Blood Component Type RBC LR PHER2    Unit division 00    Status of Unit ALLOCATED    Donor AG Type      NEGATIVE FOR E ANTIGEN NEGATIVE FOR KELL ANTIGEN NEGATIVE FOR S ANTIGEN NEGATIVE FOR KIDD A ANTIGEN NEGATIVE FOR DUFFY A ANTIGEN   Unit tag comment VERBAL ORDERS PER DR Drema [place]    Transfusion Status OK TO TRANSFUSE    Crossmatch Result LEAST INCOMPATIBLE   Glucose, capillary     Status: Abnormal   Collection Time: 12/13/17  4:21 PM  Result Value Ref Range   Glucose-Capillary 55 (  L) 65 - 99 mg/dL  Glucose, capillary     Status: Abnormal    Collection Time: 12/13/17  5:08 PM  Result Value Ref Range   Glucose-Capillary 64 (L) 65 - 99 mg/dL  Glucose, capillary     Status: Abnormal   Collection Time: 12/13/17 10:02 PM  Result Value Ref Range   Glucose-Capillary 198 (H) 65 - 99 mg/dL   Comment 1 Notify RN    Comment 2 Document in Chart   Glucose, capillary     Status: Abnormal   Collection Time: 12/14/17 12:04 AM  Result Value Ref Range   Glucose-Capillary 192 (H) 65 - 99 mg/dL   Comment 1 Notify RN    Comment 2 Document in Chart   Glucose, capillary     Status: Abnormal   Collection Time: 12/14/17  4:05 AM  Result Value Ref Range   Glucose-Capillary 198 (H) 65 - 99 mg/dL   Comment 1 Notify RN    Comment 2 Document in Chart    No results found.     Medical Problem List and Plan: 1. Decreased functional mobility with left-sided weakness and dysphagia  secondary to traumatic right subdural hematoma status post right frontotemporal parietal craniotomy 12/05/2017   -admit to inpatient rehab 2.  DVT Prophylaxis/Anticoagulation: SCDs. Monitor for any signs of DVT 3. Pain Management: Oxycodone as needed 4. Mood: Seroquel 25 mg twice a day 5. Neuropsych: This patient is capable of making decisions on his own behalf. 6. Skin/Wound Care: Routine skin checks 7. Fluids/Electrolytes/Nutrition: Routine I&O's with follow-up chemistries   -encourage PO 8. End-stage renal disease. Continue hemodialysis as per renal services. Status post arteriovenous Gore-Tex graft left arm 12/15/2017   -HD scheduled after therapies to maximize therapy time/performance 9. Diabetes mellitus and peripheral neuropathy. Latest hemoglobin A1c 6.4. SSI. Check blood sugars before meals and at bedtime 10. Acute on chronic anemia. Continue Aranesp. 11. 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 Monitor for any signs of fluid overload 12. Hypertension. Monitor with increased mobility 13. MRSA PCR screening positive. Contact  precautions  Post Admission Physician Evaluation: 1. Functional deficits secondary  to right SDH. 2. Patient is admitted to receive collaborative, interdisciplinary care between the physiatrist, rehab nursing staff, and therapy team. 3. Patient's level of medical complexity and substantial therapy needs in context of that medical necessity cannot be provided at a lesser intensity of care such as a SNF. 4. Patient has experienced substantial functional loss from his/her baseline which was documented above under the "Functional History" and "Functional Status" headings.  Judging by the patient's diagnosis, physical exam, and functional history, the patient has potential for functional progress which will result in measurable gains while on inpatient rehab.  These gains will be of substantial and practical use upon discharge  in facilitating mobility and self-care at the household level. 5. Physiatrist will provide 24 hour management of medical needs as well as oversight of the therapy plan/treatment and provide guidance as appropriate regarding the interaction of the two. 6. The Preadmission Screening has been reviewed and patient status is unchanged unless otherwise stated above. 7. 24 hour rehab nursing will assist with bladder management, bowel management, safety, skin/wound care, disease management, medication administration, pain management and patient education  and help integrate therapy concepts, techniques,education, etc. 8. PT will assess and treat for/with: ADL's, functional mobility, safety, upper extremity strength, adaptive techniques and equipment, NMR, family education.   Goals are: min assist. 9. OT will assess and treat for/with: ADL's, functional mobility, safety, upper  extremity strength, adaptive techniques and equipment, NMR, family education.   Goals are: min assist. Therapy may proceed with showering this patient. 10. SLP will assess and treat for/with: speech, cognition ,  communication, family ed.  Goals are: min assist. 11. Case Management and Social Worker will assess and treat for psychological issues and discharge planning. 12. Team conference will be held weekly to assess progress toward goals and to determine barriers to discharge. 13. Patient will receive at least 3 hours of therapy per day at least 5 days per week. 14. ELOS: 16-19 days       15. Prognosis:  excellent     [name], MD, [facility] 12/16/2017  Lavon Paganini [place], PA-C 12/14/2017

## 2017-12-14 NOTE — Progress Notes (Addendum)
   Daily Progress Note   Assessment/Planning:   ESRD, s/p R craniotomy for SDH   Essential resolution of AMS this AM  Due to cancellation, we discussed possibly doing his access today.   Patient would prefer to go in the AM tomorrow, so will hold on today.  Pt L BC AVF is occluded, so I'm not certain what cephalic vein was mapped.  Preop for tomorrow L arm AVG   Subjective  - 9 Days Post-Op   No complaints   Objective   Vitals:   12/14/17 0000 12/14/17 0400 12/14/17 0600 12/14/17 0746  BP: 133/83 138/82  136/80  Pulse: 81 77  82  Resp: 18 18  18   Temp: 98.3 F (36.8 C) 98.1 F (36.7 C)  98.8 F (37.1 C)  TempSrc: Oral Axillary  Oral  SpO2: 97% 98%  99%  Weight:   224 lb 13.9 oz (102 kg)   Height:         Intake/Output Summary (Last 24 hours) at 12/14/2017 0752 Last data filed at 12/13/2017 1900 Gross per 24 hour  Intake 510 ml  Output 30 ml  Net 480 ml   GEN Nasal feeding tube LUE Pulsatile distal cephalic vein, no thrill or bruit  Laboratory   CBC CBC Latest Ref Rng & Units 12/11/2017 12/10/2017 12/09/2017  WBC 4.0 - 10.5 K/uL 8.2 11.2(H) 13.6(H)  Hemoglobin 13.0 - 17.0 g/dL 7.4(L) 7.4(L) 7.4(L)  Hematocrit 39.0 - 52.0 % 22.4(L) 22.0(L) 22.2(L)  Platelets 150 - 400 K/uL 154 170 146(L)    BMET    Component Value Date/Time   NA 134 (L) 12/12/2017 0931   K 3.6 12/12/2017 0931   CL 95 (L) 12/12/2017 0931   CO2 25 12/12/2017 0931   GLUCOSE 97 12/12/2017 0931   BUN 45 (H) 12/12/2017 0931   CREATININE 4.79 (H) 12/12/2017 0931   CALCIUM 8.3 (L) 12/12/2017 0931   CALCIUM 8.6 10/29/2011 1000   GFRNONAA 11 (L) 12/12/2017 0931   GFRAA 13 (L) 12/12/2017 0931     Adele Barthel, MD, FACS [facility] Office: [phone] Pager: [phone]  12/14/2017, 7:52 AM

## 2017-12-14 NOTE — Evaluation (Signed)
Speech Language Pathology Evaluation Patient Details Name: Albert Stanley MRN: [id number] DOB: Jun 17, 1946 Today's Date: 12/14/2017 Time: 2423-5361 SLP Time Calculation (min) (ACUTE ONLY): 15 min  Problem List:  Patient Active Problem List   Diagnosis Date Noted  . ESRD (end stage renal disease) ([place])   . ESRD (end stage renal disease) on dialysis ([place])   . Dysphagia   . Acute blood loss anemia   . Anemia of chronic disease   . Chronic diastolic congestive heart failure ([place])   . Diabetes mellitus type 2 in nonobese ([name])   . Benign essential HTN   . Nonischemic cardiomyopathy ([place])   . Transaminitis   . Encounter for central line placement   . Endotracheally intubated   . SDH (subdural hematoma) ([place])   . Ventilator dependent ([place])   . Chronic renal impairment   . Chronic in-center hemodialysis status ([place])   . S/P craniotomy 12/05/2017  . Hyperkalemia 12/02/2017  . Malnutrition of moderate degree 12/18/2016  . PVC's (premature ventricular contractions)   . Anemia associated with chronic renal failure   . End-stage renal disease on hemodialysis ([place]) 12/09/2016  . Dyslipidemia associated with type 2 diabetes mellitus ([place]) 12/09/2016  . Acute on chronic systolic and diastolic heart failure, NYHA class 1 ([place]) 12/09/2016  . 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 Type 2 diabetes mellitus ([place]) 12/11/2015  . Fluid overload 12/06/2015  . History of DVT (deep vein thrombosis) 12/04/2015  . Renal hematoma 12/04/2015  . H/O: gout 11/16/2013  . Trochanteric bursitis 10/17/2013  . 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 Lumbar degenerative disc disease 07/17/2013   Past Medical History:  Past Medical History:  Diagnosis Date  . Arthritis    Osteoarthritis  . Asthma   . Bladder cancer ([place]) dx'd 1990   surg only  . CHF (congestive heart failure) ([place])   . Diabetes mellitus without complication ([place])   . ESRD (end stage  renal disease) ([name])    Tues, Thurs, Sat dialysis  . GERD (gastroesophageal reflux disease)   . History of cardiac catheterization    a. LHC 5/17: no obstructive CAD  . History of DVT (deep vein thrombosis)    2003 ish  . History of nuclear stress test    a. Myoview 4/17: EF 39%, inf, inf-lat, apical inf, apical lat, apical scar, intermediate risk  . Hyperlipidemia   . Hypertension   . NICM (nonischemic cardiomyopathy) ([place])    a. Echo 2/17: mod LVH, EF 35-40%, inf-lat and inf-sept HK, mod MR, severe LAE, small pericardial effusion   Past Surgical History:  Past Surgical History:  Procedure Laterality Date  . arthroscopic knee surgery Left   . AV FISTULA PLACEMENT Left 12/13/2015   Procedure: ARTERIOVENOUS (AV) FISTULA CREATION;  Surgeon: Angelia Mould, MD;  Location: [facility];  Service: Vascular;  Laterality: Left;  . BACK SURGERY     2 times  1960  . CARDIAC CATHETERIZATION N/A 02/28/2016   Procedure: Left Heart Cath and Coronary Angiography;  Surgeon: Larey Dresser, MD;  Location: [place] [facility];  Service: Cardiovascular;  Laterality: N/A;  . CRANIOTOMY N/A 12/05/2017   Procedure: CRANIOTOMY HEMATOMA EVACUATION SUBDURAL;  Surgeon: Ashok Pall, MD;  Location: [place] [facility];  Service: Neurosurgery;  Laterality: N/A;  . CYSTOSCOPY    . FISTULA SUPERFICIALIZATION Left 08/14/2016   Procedure: FISTULA SUPERFICIALIZATION LEFT UPPER ARM;  Surgeon: Angelia Mould, MD;  Location: [place];  Service: Vascular;  Laterality: Left;  . INSERTION OF DIALYSIS CATHETER N/A 12/13/2015   Procedure: INSERTION OF DIALYSIS CATHETER;  Surgeon: Angelia Mould, MD;  Location: [facility];  Service: Vascular;  Laterality: N/A;  . INSERTION OF DIALYSIS CATHETER Left 05/07/2017   Procedure: INSERTION OF DIALYSIS CATHETER LEFT INTERNAL JUGULAR PLACEMENT;  Surgeon: Angelia Mould, MD;  Location: [place];  Service: Vascular;  Laterality: Left;  . IR RADIOLOGIST EVAL & MGMT  05/12/2017  . JOINT REPLACEMENT  Left    hip  . LIGATION OF COMPETING BRANCHES OF ARTERIOVENOUS FISTULA Left 08/14/2016   Procedure: LIGATION OF COMPETING BRANCHES OF ARTERIOVENOUS FISTULA LEFT UPPER ARM;  Surgeon: Angelia Mould, MD;  Location: [place];  Service: Vascular;  Laterality: Left;  . PERIPHERAL VASCULAR CATHETERIZATION Left 06/09/2016   Procedure: Fistulagram;  Surgeon: Serafina Mitchell, MD;  Location: [place] [facility];  Service: Cardiovascular;  Laterality: Left;  ARM  . PERIPHERAL VASCULAR CATHETERIZATION Left 06/09/2016   Procedure: Peripheral Vascular Balloon Angioplasty;  Surgeon: Serafina Mitchell, MD;  Location: [place] [facility];  Service: Cardiovascular;  Laterality: Left;  upper arm venous  . PERIPHERAL VASCULAR CATHETERIZATION Left 06/25/2016   Procedure: Fistulagram;  Surgeon: Serafina Mitchell, MD;  Location: [place] [facility];  Service: Cardiovascular;  Laterality: Left;  . REVISON OF ARTERIOVENOUS FISTULA Left 05/07/2017   Procedure: REPAIR OF PSEUDOANEURYSM  LEFT BRACHIO-CEPHALIC  ARM ARTERIOVENOUS FISTULA;  Surgeon: Angelia Mould, MD;  Location: [place];  Service: Vascular;  Laterality: Left;   HPI:  Fall with intracranial hemorrhage subdural hematoma, s/p craniotomy   Assessment / Plan / Recommendation Clinical Impression  Pt with increased alertness and orientation as such cognitive linguistic evaluation performed. Pt with significant cognitive impairments impacting sustained attention, basic problem solving, awareness and appears to display visual deficits. Pt with decreased attention to right (unsure if field cut or inattention). Pt is much more interactive and appropriate this session and is able to keep eyes open for complete evaluation. Recommend follow up ST services to target cognitive recovery, increase functional independence and reduce caregiver burden. Request CIR follow up.     SLP Assessment  SLP Recommendation/Assessment: Patient needs continued Speech Lanaguage Pathology  Services SLP Visit Diagnosis: Dysphagia, unspecified (R13.10)    Follow Up Recommendations  Inpatient Rehab    Frequency and Duration min 2x/week  2 weeks      SLP Evaluation Cognition  Overall Cognitive Status: Impaired/Different from baseline Arousal/Alertness: Awake/alert Orientation Level: Oriented X4 Attention: Focused;Sustained Focused Attention: Appears intact Sustained Attention: Impaired Sustained Attention Impairment: Verbal basic;Functional basic Awareness: Impaired Awareness Impairment: Intellectual impairment Problem Solving: Impaired Problem Solving Impairment: Verbal basic;Functional basic Executive Function: (all impaired by lower level deficits) Safety/Judgment: Impaired       Comprehension  Auditory Comprehension Overall Auditory Comprehension: Appears within functional limits for tasks assessed Visual Recognition/Discrimination Discrimination: Not tested Reading Comprehension Reading Status: Not tested    Expression Expression Primary Mode of Expression: Verbal Verbal Expression Overall Verbal Expression: Appears within functional limits for tasks assessed Initiation: Impaired Automatic Speech: Name;Social Response;Day of week;Month of year Level of Generative/Spontaneous Verbalization: Sentence Repetition: No impairment Pragmatics: Impairment Impairments: Eye contact Non-Verbal Means of Communication: Not applicable Written Expression Dominant Hand: Right Written Expression: Not tested   Oral / Motor  Oral Motor/Sensory Function Overall Oral Motor/Sensory Function: Within functional limits Motor Speech Overall Motor Speech: Appears within functional limits for tasks assessed Respiration: Within functional limits Phonation: Normal Resonance: Within functional limits Articulation: Within functional limitis  Intelligibility: Intelligible Motor Planning: Witnin functional limits Motor Speech Errors: Not applicable   GO                     Fraidy Mccarrick 12/14/2017, 5:52 PM

## 2017-12-14 NOTE — Progress Notes (Signed)
[facility] Progress Note  Subjective: no /co today, getting NG out he says  Vitals:   12/14/17 0000 12/14/17 0400 12/14/17 0600 12/14/17 0746  BP: 133/83 138/82  136/80  Pulse: 81 77  82  Resp: 18 18  18   Temp: 98.3 F (36.8 C) 98.1 F (36.7 C)  98.8 F (37.1 C)  TempSrc: Oral Axillary  Oral  SpO2: 97% 98%  99%  Weight:   102 kg (224 lb 13.9 oz)   Height:        Inpatient medications: . Chlorhexidine Gluconate Cloth  6 each Topical Q2200  . darbepoetin (ARANESP) injection - DIALYSIS  100 mcg Intravenous Q Tue-HD  . feeding supplement (NEPRO CARB STEADY)  1,000 mL Per Tube Q24H  . feeding supplement (PRO-STAT SUGAR FREE 64)  30 mL Per Tube BID  . insulin aspart  0-9 Units Subcutaneous TID WC  . mouth rinse  15 mL Mouth Rinse BID  . mupirocin ointment   Nasal BID  . ondansetron (ZOFRAN) IV  4 mg Intravenous Once  . oxybutynin  5 mg Oral BID  . pantoprazole sodium  40 mg Per Tube QHS  . polyethylene glycol  17 g Oral Daily  . QUEtiapine  25 mg Oral BID  . sodium chloride flush  10-40 mL Intracatheter Q12H   . sodium chloride Stopped (12/08/17 0939)  . sodium chloride Stopped (12/14/17 1114)  . dextrose Stopped (12/08/17 1554)   sodium chloride, acetaminophen, albuterol, diphenhydrAMINE, haloperidol **OR** haloperidol lactate, MUSCLE RUB, oxyCODONE-acetaminophen, sodium chloride flush  Exam: Chronic ill appearing No jvd Chest cta bilat RRR no RG Abd obese soft ntnd Ext no sig LE edema or UE edema LUE AVF no bruit, L IJ temp cath in place  Dialysis: TTS [facility] 4h  88.5kg  2/2.25 bath  P4   LUA AVF   Hep 7000 Venofer 50mg  IV qHD Mircera 128mcg IV q2wks - last 2/14 Calcitriol 3.56mcg PO qHD   Sensipar 30mg  qd Renvela 800mg - 6AC TID, 2 w/snacks      Impression: 1  Fall / SDH - sp craniotomy 2/24 2  ESRD - cont TTS HD. HD today 3  Clotted LUA AVF thrombosis - prob due to hypotension.  Has temp cath, VVS planning for access surgery tomorrow 4  HTN -  low BP's initially, better now 5  Anemia ABL/ CKD - sp transfusion, give one more prbc on HD today; started darbe here 100 q Tues on 3/5 6  MBD ckd - f/u phos level 7  Nutrition - getting NG out today per pt 8  Syst/ diast HF - holding meds for low bp 9  Hx lymphedema - no edema today 10  Volume - weights off, euvolemic on exam   Plan - as above   Kelly Splinter MD [facility] pager [phone]   12/14/2017, 12:00 PM   Recent Labs  Lab 12/10/17 0517 12/11/17 0352 12/12/17 0931  NA 136 133* 134*  K 5.2* 4.4 3.6  CL 97* 92* 95*  CO2 26 23 25   GLUCOSE 112* 176* 97  BUN 43* 67* 45*  CREATININE 5.54* 6.71* 4.79*  CALCIUM 8.2* 8.5* 8.3*  PHOS  --   --  5.0*   Recent Labs  Lab 12/09/17 0430 12/10/17 0517 12/11/17 0352 12/12/17 0931  AST 292* 337* 265*  --   ALT 504* 503* 488*  --   ALKPHOS 88 120 140*  --   BILITOT 1.2 1.8* 1.6*  --   PROT 5.3*  5.4* 6.0*  --   ALBUMIN 2.5* 2.5* 2.8* 2.6*   Recent Labs  Lab 12/09/17 0430 12/10/17 0517 12/11/17 0352  WBC 13.6* 11.2* 8.2  NEUTROABS 11.6* 9.4* 6.6  HGB 7.4* 7.4* 7.4*  HCT 22.2* 22.0* 22.4*  MCV 89.2 90.9 90.3  PLT 146* 170 154   Iron/TIBC/Ferritin/ %Sat    Component Value Date/Time   IRON 17 (L) 12/07/2015 2122   TIBC 213 (L) 12/07/2015 2122   FERRITIN 544 (H) 12/07/2015 2122   IRONPCTSAT 8 (L) 12/07/2015 2122   IRONPCTSAT 24 05/03/2006 1529

## 2017-12-14 NOTE — Progress Notes (Signed)
  Speech Language Pathology Treatment: Dysphagia  Patient Details Name: KAHEEM HALLECK MRN: [id number] DOB: [birth year] Today's Date: 12/14/2017 Time: 1224-8250 SLP Time Calculation (min) (ACUTE ONLY): 12 min  Assessment / Plan / Recommendation Clinical Impression  Skilled treatment session focused on dysphagia goals. SLP facilitated session by providing skilled observation of pt consuming thin liquids via straw. Pt consumed 6 oz thin tea without overt s/s of aspiration. Pt didn't desire any solids (he had just eaten lunch). Will leave on dysphagia 1 until trials of advanced textures can be observed. Pt was left upright in bed.    HPI HPI: Fall with intracranial hemorrhage subdural hematoma, s/p craniotomy      SLP Plan  Continue with current plan of care       Recommendations  Diet recommendations: Dysphagia 1 (puree);Thin liquid Liquids provided via: Straw Medication Administration: Crushed with puree Supervision: Staff to assist with self feeding;Full supervision/cueing for compensatory strategies Compensations: Slow rate;Small sips/bites Postural Changes and/or Swallow Maneuvers: Seated upright 90 degrees                General recommendations: Rehab consult Oral Care Recommendations: Oral care QID Follow up Recommendations: Inpatient Rehab SLP Visit Diagnosis: Dysphagia, unspecified (R13.10) Plan: Continue with current plan of care       [place] 12/14/2017, 5:41 PM

## 2017-12-14 NOTE — Progress Notes (Signed)
Rehab admissions - I spoke with wife by phone today.  Wife wants inpatient rehab admission.  I do have approval for acute inpatient rehab admission.  Noted plans for AVG graft tomorrow.  Could potentially admit tomorrow after graft placed if patient is stable.  Or, could wait and admit on 3/7 if needed.  I will follow up tomorrow.  Call me for questions.  #[phone]

## 2017-12-14 NOTE — Consult Note (Signed)
PROGRESS NOTE    Albert Stanley  EPP:[id number] DOB: Feb 16, 1946 DOA: 12/05/2017 PCP: [name], MD    Brief Narrative:  72 y.o. male. ESRD 2/2 DM on HD TTS at [facility], first starting in 12/2015.  Past medical history significant for lymphedema, NICM with EF 40%, GERD, h/o DVT not on anticoagulation, h/o renal mass/atypical cyst and bladder Ca s/p resection in 1999.  Patient was admitted for further management of acute right panhemispheric subdural hematoma  His L BC AVF thrombosed in the recovery period of his craniotomy and a temporary dialysis catheter was placed. Plan currently is to have vascular surgery place permanent access for hemodialysis  Assessment & Plan:   Active Problems:   S/P craniotomy - Neurosurgery on board and directing care.    Endotracheally intubated - Pt on room air speaking in full sentences. Resolved.    SDH (subdural hematoma) ([name]) -  S/p craniotomy for hematoma evacuation  DM - Improved blood sugar control on sliding scale insulin and with improved oral intake. He has been able to have his diet advanced. Would continue home medication regimen once he is able to be discharged.    Chronic renal impairment - Nephrology on board and plans are to find permanent access - nephro to consult Vascular surgery for conversion of a tunneled HD catheter to permanent option    Transaminitis  Npo - Diet has been advanced. Plan is to take NG tube out if patient tolerates oral intake.  DVT prophylaxis: scd's Code Status: Full Family Communication: None at bedside Disposition Plan: Per primary    Procedures: Craniotomy  Subjective: Patient has no new complaints  Objective: Vitals:   12/14/17 0600 12/14/17 0746 12/14/17 1205 12/14/17 1633  BP:  136/80 121/75 115/69  Pulse:  82 77 75  Resp:  18 18 18   Temp:  98.8 F (37.1 C) 98.4 F (36.9 C) 98.4 F (36.9 C)  TempSrc:  Oral Oral Oral  SpO2:  99% 98% 96%  Weight: 102 kg (224 lb 13.9  oz)     Height:        Intake/Output Summary (Last 24 hours) at 12/14/2017 1842 Last data filed at 12/14/2017 0900 Gross per 24 hour  Intake 300 ml  Output 30 ml  Net 270 ml   Filed Weights   12/12/17 0500 12/13/17 0423 12/14/17 0600  Weight: 95.3 kg (210 lb 1.6 oz) 100 kg (220 lb 7.4 oz) 102 kg (224 lb 13.9 oz)    Examination:   General exam: Appears calm and comfortable, no acute distress Respiratory system: Clear to auscultation. Respiratory effort normal. Equal chest rise. Cardiovascular system: S1 & S2 heard, RRR. No JVD, murmurs, rubs Gastrointestinal system: Abdomen is nondistended, soft and nontender. No organomegaly or masses felt. Normal bowel sounds heard. Central nervous system: Alert and awake. Responds verbally to questions. Weakness on his left side when compared to his right Extremities: warm, no cyanosis Skin: No rashes, lesions or ulcers, on limited exam. Psychiatry:  Mood & affect appropriate.   Data Reviewed: I have personally reviewed following labs and imaging studies  CBC: Recent Labs  Lab 12/08/17 0334 12/09/17 0430 12/10/17 0517 12/11/17 0352  WBC 10.1 13.6* 11.2* 8.2  NEUTROABS 8.5* 11.6* 9.4* 6.6  HGB 7.0* 7.4* 7.4* 7.4*  HCT 20.1* 22.2* 22.0* 22.4*  MCV 88.9 89.2 90.9 90.3  PLT 93* 146* 170 606   Basic Metabolic Panel: Recent Labs  Lab 12/08/17 0334 12/09/17 0430 12/10/17 0517 12/11/17 0352 12/12/17 3016  NA 133* 132* 136 133* 134*  K 5.1 5.9* 5.2* 4.4 3.6  CL 97* 95* 97* 92* 95*  CO2 21* 21* 26 23 25   GLUCOSE 170* 165* 112* 176* 97  BUN 45* 61* 43* 67* 45*  CREATININE 6.87* 8.23* 5.54* 6.71* 4.79*  CALCIUM 7.5* 7.7* 8.2* 8.5* 8.3*  MG 2.1 2.3 2.3 2.4  --   PHOS  --   --   --   --  5.0*   GFR: Estimated Creatinine Clearance: 16.4 mL/min (A) (by C-G formula based on SCr of 4.79 mg/dL (H)). Liver Function Tests: Recent Labs  Lab 12/08/17 0334 12/09/17 0430 12/10/17 0517 12/11/17 0352 12/12/17 0931  AST 615* 292* 337* 265*   --   ALT 587* 504* 503* 488*  --   ALKPHOS 49 88 120 140*  --   BILITOT 1.2 1.2 1.8* 1.6*  --   PROT 4.9* 5.3* 5.4* 6.0*  --   ALBUMIN 2.4* 2.5* 2.5* 2.8* 2.6*   No results for input(s): LIPASE, AMYLASE in the last 168 hours. No results for input(s): AMMONIA in the last 168 hours. Coagulation Profile: Recent Labs  Lab 12/08/17 0334 12/09/17 0430  INR 1.69 1.72   Cardiac Enzymes: No results for input(s): CKTOTAL, CKMB, CKMBINDEX, TROPONINI in the last 168 hours. BNP (last 3 results) No results for input(s): PROBNP in the last 8760 hours. HbA1C: No results for input(s): HGBA1C in the last 72 hours. CBG: Recent Labs  Lab 12/14/17 0004 12/14/17 0405 12/14/17 0739 12/14/17 1203 12/14/17 1631  GLUCAP 192* 198* 186* 114* 136*   Lipid Profile: No results for input(s): CHOL, HDL, LDLCALC, TRIG, CHOLHDL, LDLDIRECT in the last 72 hours. Thyroid Function Tests: No results for input(s): TSH, T4TOTAL, FREET4, T3FREE, THYROIDAB in the last 72 hours. Anemia Panel: No results for input(s): VITAMINB12, FOLATE, FERRITIN, TIBC, IRON, RETICCTPCT in the last 72 hours. Sepsis Labs: No results for input(s): PROCALCITON, LATICACIDVEN in the last 168 hours.  Recent Results (from the past 240 hour(s))  MRSA PCR Screening     Status: Abnormal   Collection Time: 12/05/17  7:10 PM  Result Value Ref Range Status   MRSA by PCR POSITIVE (A) NEGATIVE Final    Comment:        The GeneXpert MRSA Assay (FDA approved for NASAL specimens only), is one component of a comprehensive MRSA colonization surveillance program. It is not intended to diagnose MRSA infection nor to guide or monitor treatment for MRSA infections. RESULT CALLED TO, READ BACK BY AND VERIFIED WITH: PRIDGEN,T RN 12/05/17 2222 SKEEN,P      Radiology Studies: No results found.  Scheduled Meds: . Chlorhexidine Gluconate Cloth  6 each Topical Q2200  . darbepoetin (ARANESP) injection - DIALYSIS  100 mcg Intravenous Q Tue-HD    . feeding supplement (NEPRO CARB STEADY)  1,000 mL Per Tube Q24H  . feeding supplement (PRO-STAT SUGAR FREE 64)  30 mL Per Tube BID  . insulin aspart  0-9 Units Subcutaneous TID WC  . mouth rinse  15 mL Mouth Rinse BID  . mupirocin ointment   Nasal BID  . ondansetron (ZOFRAN) IV  4 mg Intravenous Once  . oxybutynin  5 mg Oral BID  . pantoprazole sodium  40 mg Per Tube QHS  . polyethylene glycol  17 g Oral Daily  . QUEtiapine  25 mg Oral BID  . sodium chloride flush  10-40 mL Intracatheter Q12H   Continuous Infusions: . sodium chloride Stopped (12/08/17 0939)  . sodium chloride Stopped (  12/14/17 1114)  . dextrose Stopped (12/08/17 1554)     LOS: 9 days    Time spent: > 15 minutes  Velvet Bathe, MD [facility] Pager [phone]  If 7PM-7AM, please contact night-coverage [email or web address] Password [email or web address] 12/14/2017, 6:42 PM

## 2017-12-15 ENCOUNTER — Inpatient Hospital Stay (HOSPITAL_COMMUNITY): Payer: Medicare Other

## 2017-12-15 ENCOUNTER — Encounter (HOSPITAL_COMMUNITY): Payer: Self-pay | Admitting: Anesthesiology

## 2017-12-15 ENCOUNTER — Inpatient Hospital Stay (HOSPITAL_COMMUNITY): Payer: Medicare Other | Admitting: Anesthesiology

## 2017-12-15 ENCOUNTER — Encounter (HOSPITAL_COMMUNITY)
Admission: EM | Disposition: A | Discharge status: Rehabilitation Facility | Payer: Self-pay | Source: Home / Self Care | Attending: Neurosurgery

## 2017-12-15 DIAGNOSIS — E1169 Type 2 diabetes mellitus with other specified complication: Secondary | ICD-10-CM

## 2017-12-15 DIAGNOSIS — Z992 Dependence on renal dialysis: Secondary | ICD-10-CM

## 2017-12-15 HISTORY — PX: INSERTION OF DIALYSIS CATHETER: SHX1324

## 2017-12-15 HISTORY — PX: AV FISTULA PLACEMENT: SHX1204

## 2017-12-15 LAB — BASIC METABOLIC PANEL
ANION GAP: 16 — AB (ref 5–15)
BUN: 92 mg/dL — ABNORMAL HIGH (ref 6–20)
CHLORIDE: 92 mmol/L — AB (ref 101–111)
CO2: 23 mmol/L (ref 22–32)
Calcium: 7.7 mg/dL — ABNORMAL LOW (ref 8.9–10.3)
Creatinine, Ser: 8.01 mg/dL — ABNORMAL HIGH (ref 0.61–1.24)
GFR calc Af Amer: 7 mL/min — ABNORMAL LOW (ref >60)
GFR, EST NON AFRICAN AMERICAN: 6 mL/min — AB (ref >60)
GLUCOSE: 126 mg/dL — AB (ref 65–99)
POTASSIUM: 4.6 mmol/L (ref 3.5–5.1)
Sodium: 131 mmol/L — ABNORMAL LOW (ref 135–145)

## 2017-12-15 LAB — GLUCOSE, CAPILLARY
GLUCOSE-CAPILLARY: 113 mg/dL — AB (ref 65–99)
GLUCOSE-CAPILLARY: 162 mg/dL — AB (ref 65–99)
Glucose-Capillary: 142 mg/dL — ABNORMAL HIGH (ref 65–99)
Glucose-Capillary: 95 mg/dL (ref 65–99)

## 2017-12-15 SURGERY — INSERTION OF ARTERIOVENOUS (AV) GORE-TEX GRAFT ARM
Anesthesia: Monitor Anesthesia Care | Site: Neck | Laterality: Left

## 2017-12-15 MED ORDER — HEPARIN SODIUM (PORCINE) 1000 UNIT/ML IJ SOLN
INTRAMUSCULAR | Status: DC | PRN
  Administered 2017-12-15: 1000 [IU]

## 2017-12-15 MED ORDER — DEXMEDETOMIDINE HCL IN NACL 200 MCG/50ML IV SOLN
INTRAVENOUS | Status: DC | PRN
  Administered 2017-12-15 (×2): 4 ug via INTRAVENOUS
  Administered 2017-12-15: 8 ug via INTRAVENOUS
  Administered 2017-12-15: 4 ug via INTRAVENOUS
  Administered 2017-12-15: 8 ug via INTRAVENOUS

## 2017-12-15 MED ORDER — PROPOFOL 10 MG/ML IV BOLUS
INTRAVENOUS | Status: AC
  Filled 2017-12-15: qty 20

## 2017-12-15 MED ORDER — DARBEPOETIN ALFA 100 MCG/0.5ML IJ SOSY
100.0000 ug | PREFILLED_SYRINGE | INTRAMUSCULAR | Status: AC
  Administered 2017-12-15: 100 ug via INTRAVENOUS
  Filled 2017-12-15: qty 0.5

## 2017-12-15 MED ORDER — PHENYLEPHRINE 40 MCG/ML (10ML) SYRINGE FOR IV PUSH (FOR BLOOD PRESSURE SUPPORT)
PREFILLED_SYRINGE | INTRAVENOUS | Status: DC | PRN
Start: 2017-12-15 — End: 2017-12-15
  Administered 2017-12-15 (×5): 80 ug via INTRAVENOUS

## 2017-12-15 MED ORDER — PHENYLEPHRINE HCL 10 MG/ML IJ SOLN
INTRAVENOUS | Status: DC | PRN
  Administered 2017-12-15: 40 ug/min via INTRAVENOUS

## 2017-12-15 MED ORDER — ONDANSETRON HCL 4 MG/2ML IJ SOLN
INTRAMUSCULAR | Status: AC
  Filled 2017-12-15: qty 2

## 2017-12-15 MED ORDER — HEPARIN SODIUM (PORCINE) 1000 UNIT/ML IJ SOLN
INTRAMUSCULAR | Status: AC
  Filled 2017-12-15: qty 1

## 2017-12-15 MED ORDER — LIDOCAINE 2% (20 MG/ML) 5 ML SYRINGE
INTRAMUSCULAR | Status: AC
  Filled 2017-12-15: qty 5

## 2017-12-15 MED ORDER — FENTANYL CITRATE (PF) 100 MCG/2ML IJ SOLN
INTRAMUSCULAR | Status: DC | PRN
  Administered 2017-12-15 (×2): 25 ug via INTRAVENOUS

## 2017-12-15 MED ORDER — DARBEPOETIN ALFA 100 MCG/0.5ML IJ SOSY
100.0000 ug | PREFILLED_SYRINGE | INTRAMUSCULAR | Status: DC
  Filled 2017-12-15: qty 0.5

## 2017-12-15 MED ORDER — 0.9 % SODIUM CHLORIDE (POUR BTL) OPTIME
TOPICAL | Status: DC | PRN
  Administered 2017-12-15: 1000 mL

## 2017-12-15 MED ORDER — SODIUM CHLORIDE 0.9 % IV SOLN
INTRAVENOUS | Status: DC | PRN
  Administered 2017-12-15: 09:00:00 via INTRAVENOUS

## 2017-12-15 MED ORDER — PHENYLEPHRINE 40 MCG/ML (10ML) SYRINGE FOR IV PUSH (FOR BLOOD PRESSURE SUPPORT)
PREFILLED_SYRINGE | INTRAVENOUS | Status: AC
  Filled 2017-12-15: qty 10

## 2017-12-15 MED ORDER — HEMOSTATIC AGENTS (NO CHARGE) OPTIME
TOPICAL | Status: DC | PRN
  Administered 2017-12-15: 1 via TOPICAL

## 2017-12-15 MED ORDER — VANCOMYCIN HCL IN DEXTROSE 500-5 MG/100ML-% IV SOLN
500.0000 mg | INTRAVENOUS | Status: AC
  Administered 2017-12-15: 500 mg via INTRAVENOUS
  Filled 2017-12-15: qty 100

## 2017-12-15 MED ORDER — FENTANYL CITRATE (PF) 100 MCG/2ML IJ SOLN: 25.0000 ug | INTRAMUSCULAR | Status: DC | PRN

## 2017-12-15 MED ORDER — LIDOCAINE-EPINEPHRINE (PF) 1 %-1:200000 IJ SOLN
INTRAMUSCULAR | Status: AC
  Filled 2017-12-15: qty 30

## 2017-12-15 MED ORDER — PROPOFOL 500 MG/50ML IV EMUL
INTRAVENOUS | Status: DC | PRN
  Administered 2017-12-15: 50 ug/kg/min via INTRAVENOUS
  Administered 2017-12-15: 10:00:00 via INTRAVENOUS

## 2017-12-15 MED ORDER — DARBEPOETIN ALFA 100 MCG/0.5ML IJ SOSY
PREFILLED_SYRINGE | INTRAMUSCULAR | Status: AC
  Administered 2017-12-15: 100 ug via INTRAVENOUS
  Filled 2017-12-15: qty 0.5

## 2017-12-15 MED ORDER — EPHEDRINE 5 MG/ML INJ
INTRAVENOUS | Status: AC
  Filled 2017-12-15: qty 10

## 2017-12-15 MED ORDER — LIDOCAINE-EPINEPHRINE (PF) 1 %-1:200000 IJ SOLN
INTRAMUSCULAR | Status: DC | PRN
  Administered 2017-12-15: 30 mL

## 2017-12-15 MED ORDER — ONDANSETRON HCL 4 MG/2ML IJ SOLN
INTRAMUSCULAR | Status: DC | PRN
  Administered 2017-12-15: 4 mg via INTRAVENOUS

## 2017-12-15 MED ORDER — FENTANYL CITRATE (PF) 250 MCG/5ML IJ SOLN
INTRAMUSCULAR | Status: AC
  Filled 2017-12-15: qty 5

## 2017-12-15 MED ORDER — DEXMEDETOMIDINE HCL IN NACL 200 MCG/50ML IV SOLN
INTRAVENOUS | Status: AC
  Filled 2017-12-15: qty 50

## 2017-12-15 MED ORDER — HEPARIN SODIUM (PORCINE) 1000 UNIT/ML DIALYSIS
1000.0000 [IU] | INTRAMUSCULAR | Status: DC | PRN
  Administered 2017-12-15: 1000 [IU] via INTRAVENOUS_CENTRAL

## 2017-12-15 MED ORDER — SODIUM CHLORIDE 0.9 % IV SOLN
INTRAVENOUS | Status: DC | PRN
  Administered 2017-12-15: 08:00:00 500 mL

## 2017-12-15 SURGICAL SUPPLY — 61 items
ADH SKN CLS APL DERMABOND .7 (GAUZE/BANDAGES/DRESSINGS) ×2
ARMBAND PINK RESTRICT EXTREMIT (MISCELLANEOUS) ×8 IMPLANT
BAG DECANTER FOR FLEXI CONT (MISCELLANEOUS) ×2 IMPLANT
BIOPATCH RED 1 DISK 7.0 (GAUZE/BANDAGES/DRESSINGS) ×3 IMPLANT
BIOPATCH RED 1IN DISK 7.0MM (GAUZE/BANDAGES/DRESSINGS) ×1
CANISTER SUCT 3000ML PPV (MISCELLANEOUS) ×4 IMPLANT
CATH PALINDROME RT-P 15FX19CM (CATHETERS) IMPLANT
CATH PALINDROME RT-P 15FX23CM (CATHETERS) IMPLANT
CATH PALINDROME RT-P 15FX28CM (CATHETERS) ×2 IMPLANT
CATH PALINDROME RT-P 15FX55CM (CATHETERS) IMPLANT
CLIP VESOCCLUDE MED 6/CT (CLIP) ×4 IMPLANT
CLIP VESOCCLUDE SM WIDE 6/CT (CLIP) ×4 IMPLANT
COVER PROBE W GEL 5X96 (DRAPES) ×4 IMPLANT
COVER SURGICAL LIGHT HANDLE (MISCELLANEOUS) ×4 IMPLANT
DERMABOND ADVANCED (GAUZE/BANDAGES/DRESSINGS) ×2
DERMABOND ADVANCED .7 DNX12 (GAUZE/BANDAGES/DRESSINGS) ×4 IMPLANT
DRAPE C-ARM 42X72 X-RAY (DRAPES) ×4 IMPLANT
DRAPE CHEST BREAST 15X10 FENES (DRAPES) ×4 IMPLANT
ELECT REM PT RETURN 9FT ADLT (ELECTROSURGICAL) ×4
ELECTRODE REM PT RTRN 9FT ADLT (ELECTROSURGICAL) ×2 IMPLANT
GAUZE SPONGE 2X2 8PLY STRL LF (GAUZE/BANDAGES/DRESSINGS) IMPLANT
GAUZE SPONGE 4X4 12PLY STRL LF (GAUZE/BANDAGES/DRESSINGS) ×2 IMPLANT
GAUZE SPONGE 4X4 16PLY XRAY LF (GAUZE/BANDAGES/DRESSINGS) ×4 IMPLANT
GLOVE BIOGEL PI IND STRL 7.5 (GLOVE) ×4 IMPLANT
GLOVE BIOGEL PI INDICATOR 7.5 (GLOVE) ×4
GLOVE SURG SS PI 7.5 STRL IVOR (GLOVE) ×8 IMPLANT
GOWN STRL REUS W/ TWL LRG LVL3 (GOWN DISPOSABLE) ×6 IMPLANT
GOWN STRL REUS W/ TWL XL LVL3 (GOWN DISPOSABLE) ×4 IMPLANT
GOWN STRL REUS W/TWL LRG LVL3 (GOWN DISPOSABLE) ×12
GOWN STRL REUS W/TWL XL LVL3 (GOWN DISPOSABLE) ×8
GRAFT GORETEX STRT 4-7X45 (Vascular Products) ×2 IMPLANT
HEMOSTAT SNOW SURGICEL 2X4 (HEMOSTASIS) IMPLANT
KIT BASIN OR (CUSTOM PROCEDURE TRAY) ×8 IMPLANT
KIT ROOM TURNOVER OR (KITS) ×8 IMPLANT
NDL 18GX1X1/2 (RX/OR ONLY) (NEEDLE) ×2 IMPLANT
NDL HYPO 25GX1X1/2 BEV (NEEDLE) ×2 IMPLANT
NEEDLE 18GX1X1/2 (RX/OR ONLY) (NEEDLE) ×8 IMPLANT
NEEDLE HYPO 25GX1X1/2 BEV (NEEDLE) ×4 IMPLANT
NS IRRIG 1000ML POUR BTL (IV SOLUTION) ×8 IMPLANT
PACK CV ACCESS (CUSTOM PROCEDURE TRAY) ×4 IMPLANT
PACK SURGICAL SETUP 50X90 (CUSTOM PROCEDURE TRAY) ×4 IMPLANT
PAD ARMBOARD 7.5X6 YLW CONV (MISCELLANEOUS) ×16 IMPLANT
SOAP 2 % CHG 4 OZ (WOUND CARE) ×4 IMPLANT
SPONGE GAUZE 2X2 STER 10/PKG (GAUZE/BANDAGES/DRESSINGS) ×2
SUT ETHILON 3 0 PS 1 (SUTURE) ×4 IMPLANT
SUT PROLENE 6 0 BV (SUTURE) ×8 IMPLANT
SUT VIC AB 3-0 SH 27 (SUTURE) ×8
SUT VIC AB 3-0 SH 27X BRD (SUTURE) ×4 IMPLANT
SUT VICRYL 4-0 PS2 18IN ABS (SUTURE) ×4 IMPLANT
SYR 10ML LL (SYRINGE) ×4 IMPLANT
SYR 20CC LL (SYRINGE) ×10 IMPLANT
SYR 5ML LL (SYRINGE) ×6 IMPLANT
SYR CONTROL 10ML LL (SYRINGE) ×4 IMPLANT
SYR TOOMEY 50ML (SYRINGE) IMPLANT
TAPE CLOTH SURG 4X10 WHT LF (GAUZE/BANDAGES/DRESSINGS) ×2 IMPLANT
TOWEL GREEN STERILE (TOWEL DISPOSABLE) ×12 IMPLANT
TOWEL GREEN STERILE FF (TOWEL DISPOSABLE) ×4 IMPLANT
UNDERPAD 30X30 (UNDERPADS AND DIAPERS) ×4 IMPLANT
WATER STERILE IRR 1000ML POUR (IV SOLUTION) ×8 IMPLANT
WIRE AMPLATZ SS-J .035X180CM (WIRE) ×2 IMPLANT
WIRE BENTSON .035X145CM (WIRE) ×2 IMPLANT

## 2017-12-15 NOTE — Progress Notes (Signed)
Inpatient Rehabilitation  Attempted to meet with patient to discuss IP Rehab; however, patient off unit at this time.  Will attempt to follow up later.  Call if questions.   Carmelia Roller., CCC/SLP Admission Coordinator  [place]  Cell [phone]

## 2017-12-15 NOTE — Progress Notes (Signed)
PT Cancellation Note  Patient Details Name: Albert Stanley MRN: [id number] DOB: 1946-07-27   Cancelled Treatment:    Reason Eval/Treat Not Completed: Patient declined, no reason specified RN reports patient OK for PT to work with following procedure, however patient politely declines PT as he is feeling very groggy and tired from medications, family also requests that PT return tomorrow instead. Hold PT for today, plan to attempt to return as able next date of service.    Deniece Ree PT, DPT, CBIS  Supplemental Physical Therapist [facility]   Pager [phone]

## 2017-12-15 NOTE — Interval H&P Note (Signed)
History and Physical Interval Note:  12/15/2017 8:29 AM  Albert Stanley  has presented today for surgery, with the diagnosis of end stage renal disease  The various methods of treatment have been discussed with the patient and family. After consideration of risks, benefits and other options for treatment, the patient has consented to  Procedure(s): INSERTION OF ARTERIOVENOUS (AV) GORE-TEX GRAFT ARM (Left) as a surgical intervention .  The patient's history has been reviewed, patient examined, no change in status, stable for surgery.  I have reviewed the patient's chart and labs.  Questions were answered to the patient's satisfaction.     Annamarie Major

## 2017-12-15 NOTE — Anesthesia Procedure Notes (Signed)
Procedure Name: MAC Date/Time: 12/15/2017 8:50 AM Performed by: Renato Shin, CRNA Pre-anesthesia Checklist: Patient identified, Emergency Drugs available, Suction available and Patient being monitored Patient Re-evaluated:Patient Re-evaluated prior to induction Oxygen Delivery Method: Simple face mask Preoxygenation: Pre-oxygenation with 100% oxygen Induction Type: IV induction Placement Confirmation: positive ETCO2,  CO2 detector and breath sounds checked- equal and bilateral Dental Injury: Teeth and Oropharynx as per pre-operative assessment

## 2017-12-15 NOTE — Transfer of Care (Signed)
Immediate Anesthesia Transfer of Care Note  Patient: Albert Stanley  Procedure(s) Performed: INSERTION OF ARTERIOVENOUS (AV) GORE-TEX GRAFT ARM (Left Arm Upper) INSERTION OF DIALYSIS CATHETER (Left Neck)  Patient Location: PACU  Anesthesia Type:MAC  Level of Consciousness: awake, alert  and patient cooperative  Airway & Oxygen Therapy: Patient Spontanous Breathing and Patient connected to nasal cannula oxygen  Post-op Assessment: Report given to RN and Post -op Vital signs reviewed and stable  Post vital signs: Reviewed and stable  Last Vitals:  Vitals:   12/15/17 0050 12/15/17 0420  BP: 113/69 117/66  Pulse: 71 73  Resp: 18 18  Temp: 36.7 C 36.9 C  SpO2: 100% 100%    Last Pain:  Vitals:   12/15/17 0420  TempSrc: Oral  PainSc:       Patients Stated Pain Goal: 0 (92/11/94 1740)  Complications: No apparent anesthesia complications

## 2017-12-15 NOTE — Op Note (Signed)
Patient name: Albert Stanley MRN: [id number] DOB: March 27, 1946 Sex: male  12/15/2017 Pre-operative Diagnosis: ESRD Post-operative diagnosis:  Same Surgeon:  Annamarie Major Assistants:  M.Collins Procedure:   #1 removal of temporary left internal jugular vein dialysis catheter and placement of a tunneled left internal jugular vein dialysis catheter   #2: Left upper arm Gore-Tex graft (4 x 7) Anesthesia: MAC Blood Loss: 50 cc Specimens: None  Findings: Catheter tip at cavoatrial junction.  Brachial artery was 5 mm with mild calcification.  Venous anastomosis was to the axillary vein in a end-to-side fashion.  Indications: The patient has recently occluded his left brachiocephalic fistula.  He comes in today for new access.  Procedure:  The patient was identified in the holding area and taken to [facility]  The patient was then placed supine on the table. MAC anesthesia was administered.  The patient was prepped and draped in the usual sterile fashion.  A time out was called and antibiotics were administered.  A 035 Bentson wire was inserted through the existing temporary dialysis catheter which was then removed.  I tried to insert a peel-away sheath however it kinked at the skin surface.  I therefore inserted a small dilator and remove the Bentson wire and placed an Amplatz superstiff wire.  The dilator was removed and the peel-away sheath was then inserted.  A 28 cm Pallindrom catheter was then inserted through the peel-away sheath which was then removed.e a skin exit site was selected and incision was made with a #11 blade.  A subcutaneous tunnel was created between the 2 incisions and the catheter was brought through the tunnel with the cuff situated at the skin exit site.  Fluoroscopy confirmed that the catheter tip was at the cavoatrial junction.  The catheter was then connected to the 2 ports which flushed and aspirated without difficulty.  The catheter was sutured into position with 3-0  nylon and the neck incision was closed with 4-0 Vicryl followed by Dermabond.  The catheter was then filled with the appropriate volumes of heparin.  Sterile dressings were applied.  Attention was then turned towards the left arm.  I have evaluated him for a basilic vein fistula however I do not see an adequate vein.  I then made the decision to proceed with a upper arm graft.  A longitudinal incision was made just proximal to the antecubital crease.  Through this incision I dissected out the brachial artery which was a healthy 5 x 6 mm artery with mild calcification.  I then made a longitudinal incision up near the axilla.  Through this incision I dissected out the axillary vein which was a healthy 6 mm vein.  I then used a curved Gore tunneler to create a tunnel between the 2 incisions.  The tunnel I had to be constructed around the existing fistula.  It goes medial to the existing fistula because I was unable to tunnel around the existing fistula due to scar tissue.  I then brought a 4 x 7 Gore-Tex graft through the tunnel.  I did not give heparin as he has had a recent subdural hematoma.  The brachial artery was then occluded with vascular clamps.  A #11 blade was used to make an arteriotomy which was extended longitudinally with Potts scissors.  The graft was cut to the appropriate length and a running end to side anastomosis was created with 6-0 Prolene.  The appropriate flushing maneuvers were performed and the anastomosis was completed.  There was excellent flow through the graft.  The graft was flushed with heparin saline and reoccluded.  Attention was then turned towards the axillary vein.  This was occluded with vascular clamps and a #11 blade was used to make a venotomy which was extended longitudinally with Potts scissors.  The graft was cut to the appropriate length and beveled to fit the size of the venotomy.  A running anastomosis was created with 6-0 Prolene.  Prior to completion the appropriate  flushing maneuvers were performed and the anastomosis was completed.  There was an excellent thrill within the graft and the patient had a brisk radial artery signal.  Once hemostasis was satisfactory, the incision was closed with 2 layers of 3-0 Vicryl followed by Dermabond.  The patient tolerated the procedure well there were no immediate complications.   Disposition: To PACU stable   V. Annamarie Major, M.D. [facility] Office: [phone] Pager:  [phone]

## 2017-12-15 NOTE — Progress Notes (Signed)
CONSULT  NOTE    Albert Stanley   QQV:[id number]  DOB: October 19, 1945  DOA: 12/05/2017 PCP: [name], MD   Brief Narrative:  Albert Stanley male. 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 Patient was admitted for acute right subdural hematomaright SDH with 13 mm right to left midline shift after a fall and is s/p craniotomy for decompression. He was intubated temporarily and was fed via a cor trac.  [facility] consulted to [place] issues.    Subjective: He understands that the NG tube is to be removed today. Wife states he has not had a BM in about 1 wk ROS: no complaints of nausea, vomiting,  diarrhea, cough, dyspnea or dysuria. No other complaints.   Assessment & Plan:  Active Problems:   S/P craniotomy/ SDH - Neurosurgery directing care  NOTE: quite sleepy today- he is on Seroquel BID which is not a home med- ? If it was started in the ICU- will d/c this  and follow to see if he becomes more alert in the daytime   DM 2   - On Glipizide at home per med rec - cont SSI   ESRD on HD - appreciate management per nephrology  Thrombosis of RUA AVG - vascular surgery placed new AVG today  Diet - remove cor trac as he is now swallowing well- D1 purred with thin liquids recommended by SLP- patient wants to have regular foo but needs further eval by SLP who is following   DVT prophylaxis: SCDs Code Status: Full code Family Communication: wife Disposition Plan: per primary team  Antimicrobials:  Anti-infectives (From admission, onward)   Start     Dose/Rate Route Frequency Ordered Stop   12/15/17 0845  vancomycin (VANCOCIN) IVPB 500 mg/100 ml premix     500 mg 100 mL/hr over 60 Minutes Intravenous To ShortStay Surgical 12/15/17 0838 12/15/17 0848       Objective: Vitals:   12/15/17 1215 12/15/17 1230 12/15/17 1245 12/15/17 1300  BP: 117/66 (!) 98/55  (!) 102/55 (!) 100/54  Pulse: 100  64 65  Resp: 15 12 17 14   Temp:    98.1 F (36.7 C)  TempSrc:      SpO2: 100% 99% 100% 100%  Weight:      Height:        Intake/Output Summary (Last 24 hours) at 12/15/2017 1623 Last data filed at 12/15/2017 1300 Gross per 24 hour  Intake 475 ml  Output 200 ml  Net 275 ml   Filed Weights   12/12/17 0500 12/13/17 0423 12/14/17 0600  Weight: 95.3 kg (210 lb 1.6 oz) 100 kg (220 lb 7.4 oz) 102 kg (224 lb 13.9 oz)    Examination: General exam: Appears comfortable  HEENT: PERRLA, oral mucosa moist, no sclera icterus or thrush Respiratory system: Clear to auscultation. Respiratory effort normal. Cardiovascular system: S1 & S2 heard, RRR.  No murmurs  Gastrointestinal system: Abdomen soft, non-tender, nondistended. Normal bowel sound. No organomegaly Central nervous system: Alert and oriented. No focal neurological deficits. Extremities: No cyanosis, clubbing or edema Skin: No rashes or ulcers Psychiatry:  Mood & affect appropriate.     Data Reviewed: I have personally reviewed following labs and imaging studies  CBC: Recent Labs  Lab 12/09/17 0430 12/10/17 0517 12/11/17 0352 12/14/17 1939  WBC 13.6* 11.2* 8.2 7.8  NEUTROABS 11.6* 9.4* 6.6  --   HGB  7.4* 7.4* 7.4* 7.5*  HCT 22.2* 22.0* 22.4* 22.4*  MCV 89.2 90.9 90.3 92.6  PLT 146* 170 154 814   Basic Metabolic Panel: Recent Labs  Lab 12/09/17 0430 12/10/17 0517 12/11/17 0352 12/12/17 0931 12/14/17 1939 12/15/17 0603  NA 132* 136 133* 134* 131* 131*  K 5.9* 5.2* 4.4 3.6 4.2 4.6  CL 95* 97* 92* 95* 90* 92*  CO2 21* 26 23 25 26 23   GLUCOSE 165* 112* 176* 97 87 126*  BUN 61* 43* 67* 45* 84* 92*  CREATININE 8.23* 5.54* 6.71* 4.79* 7.49* 8.01*  CALCIUM 7.7* 8.2* 8.5* 8.3* 7.9* 7.7*  MG 2.3 2.3 2.4  --   --   --   PHOS  --   --   --  5.0* 6.1*  --    GFR: Estimated Creatinine Clearance: 9.8 mL/min (A) (by C-G formula based on SCr of 8.01 mg/dL (H)). Liver Function  Tests: Recent Labs  Lab 12/09/17 0430 12/10/17 0517 12/11/17 0352 12/12/17 0931 12/14/17 1939  AST 292* 337* 265*  --   --   ALT 504* 503* 488*  --   --   ALKPHOS 88 120 140*  --   --   BILITOT 1.2 1.8* 1.6*  --   --   PROT 5.3* 5.4* 6.0*  --   --   ALBUMIN 2.5* 2.5* 2.8* 2.6* 2.4*   No results for input(s): LIPASE, AMYLASE in the last 168 hours. No results for input(s): AMMONIA in the last 168 hours. Coagulation Profile: Recent Labs  Lab 12/09/17 0430  INR 1.72   Cardiac Enzymes: No results for input(s): CKTOTAL, CKMB, CKMBINDEX, TROPONINI in the last 168 hours. BNP (last 3 results) No results for input(s): PROBNP in the last 8760 hours. HbA1C: No results for input(s): HGBA1C in the last 72 hours. CBG: Recent Labs  Lab 12/14/17 1631 12/14/17 2034 12/15/17 0048 12/15/17 0420 12/15/17 1141  GLUCAP 136* 94 162* 142* 95   Lipid Profile: No results for input(s): CHOL, HDL, LDLCALC, TRIG, CHOLHDL, LDLDIRECT in the last 72 hours. Thyroid Function Tests: No results for input(s): TSH, T4TOTAL, FREET4, T3FREE, THYROIDAB in the last 72 hours. Anemia Panel: No results for input(s): VITAMINB12, FOLATE, FERRITIN, TIBC, IRON, RETICCTPCT in the last 72 hours. Urine analysis:    Component Value Date/Time   COLORURINE YELLOW 06/26/2017 1652   APPEARANCEUR TURBID (A) 06/26/2017 1652   LABSPEC 1.015 06/26/2017 1652   PHURINE 6.0 06/26/2017 1652   GLUCOSEU NEGATIVE 06/26/2017 1652   HGBUR LARGE (A) 06/26/2017 1652   BILIRUBINUR NEGATIVE 06/26/2017 1652   KETONESUR NEGATIVE 06/26/2017 1652   PROTEINUR >=300 (A) 06/26/2017 1652   UROBILINOGEN 0.2 05/28/2010 1109   NITRITE NEGATIVE 06/26/2017 1652   LEUKOCYTESUR MODERATE (A) 06/26/2017 1652   Sepsis Labs: @LABRCNTIP (procalcitonin:4,lacticidven:4) ) Recent Results (from the past 240 hour(s))  MRSA PCR Screening     Status: Abnormal   Collection Time: 12/05/17  7:10 PM  Result Value Ref Range Status   MRSA by PCR POSITIVE  (A) NEGATIVE Final    Comment:        The GeneXpert MRSA Assay (FDA approved for NASAL specimens only), is one component of a comprehensive MRSA colonization surveillance program. It is not intended to diagnose MRSA infection nor to guide or monitor treatment for MRSA infections. RESULT CALLED TO, READ BACK BY AND VERIFIED WITH: PRIDGEN,T RN 12/05/17 2222 SKEEN,P          Radiology Studies: Dg Chest Port 1 View  Result Date: 12/15/2017  CLINICAL DATA:  Status post dialysis catheter placement today. EXAM: PORTABLE CHEST 1 VIEW COMPARISON:  Single-view of the chest 12/09/2017. FINDINGS: Previously seen left IJ approach dialysis catheter has been removed and a new catheter is in place. No pneumothorax is identified. Tip of the new catheter projects in the right atrium. There is left basilar airspace disease and a small effusion. Cardiomegaly is noted. No pulmonary edema. Aortic atherosclerosis is identified. Feeding tube is unchanged. IMPRESSION: Tip dialysis of new left catheter IJ approach projects in the right atrium. No pneumothorax. Small left effusion and left basilar airspace disease, likely atelectasis. Cardiomegaly without edema. Atherosclerosis. Electronically Signed   By: Inge Rise M.D.   On: 12/15/2017 13:24   Dg Fluoro Guide Cv Line-no Report  Result Date: 12/15/2017 Fluoroscopy was utilized by the requesting physician.  No radiographic interpretation.      Scheduled Meds: . allopurinol  300 mg Oral Daily  . amiodarone  200 mg Oral Daily  . [START ON 12/16/2017] calcitRIOL  0.5 mcg Oral Q T,Th,Sa-HD  . Chlorhexidine Gluconate Cloth  6 each Topical Q2200  . darbepoetin (ARANESP) injection - DIALYSIS  100 mcg Intravenous Q Wed-HD  . [START ON 12/23/2017] darbepoetin (ARANESP) injection - DIALYSIS  100 mcg Intravenous Q Thu-HD  . feeding supplement (NEPRO CARB STEADY)  1,000 mL Per Tube Q24H  . feeding supplement (PRO-STAT SUGAR FREE 64)  30 mL Per Tube BID  . heparin   7,000 Units Dialysis Once in dialysis  . insulin aspart  0-9 Units Subcutaneous TID WC  . mouth rinse  15 mL Mouth Rinse BID  . multivitamin  1 tablet Oral QHS  . mupirocin ointment   Nasal BID  . ondansetron (ZOFRAN) IV  4 mg Intravenous Once  . oxybutynin  5 mg Oral BID  . pantoprazole sodium  40 mg Per Tube QHS  . polyethylene glycol  17 g Oral Daily  . QUEtiapine  25 mg Oral BID  . sevelamer carbonate  2,400 mg Oral TID WC  . sodium chloride flush  10-40 mL Intracatheter Q12H   Continuous Infusions: . sodium chloride Stopped (12/08/17 0939)  . sodium chloride Stopped (12/14/17 1114)  . dextrose Stopped (12/08/17 1554)     LOS: 10 days    Time spent in minutes: 35    Debbe Odea, MD [facility] Pager: [email or web address] Password [facility] 12/15/2017, 4:23 PM

## 2017-12-15 NOTE — Progress Notes (Signed)
Inpatient Rehabilitation  Met with patient and spouse at bedside to discuss team's recommendation for IP Rehab.  Shared booklets, insurance verification letter, and answered questions.  Plan to continue to follow for timing of medical readiness.  Hopeful for an IP Rehab admission tomorrow.  Call if questions.    Carmelia Roller., CCC/SLP Admission Coordinator  [place]  Cell [phone]

## 2017-12-15 NOTE — Progress Notes (Signed)
HD tx completed @ 2130 w/o problem, UF goal met, blood rinsed back,  VSS, attempted to call report, prim RN in contact room, awaiting call back for report

## 2017-12-15 NOTE — Progress Notes (Signed)
MD ordered Cortrak to be removed. Patient and spouse educated on removal, and patient tolerated well.

## 2017-12-15 NOTE — Progress Notes (Signed)
OT Cancellation    12/15/17 0900  OT Visit Information  Last OT Received On 12/15/17  Reason Eval/Treat Not Completed Patient at procedure or test/ unavailable (surgery)  [facility], OT/L  [phone] 12/15/2017

## 2017-12-15 NOTE — Progress Notes (Signed)
Pt returned from dialysis. Pt is lethargic but arousable.

## 2017-12-15 NOTE — Progress Notes (Addendum)
Subjective:  Sp Dr Trula Slade L UA AVGG / L IJ Perm cath / for hd today missed yest sec. Clotted RUA AVGG / noted in hosp rehab  Admit eval in process  Objective Vital signs in last 24 hours: Vitals:   12/15/17 1215 12/15/17 1230 12/15/17 1245 12/15/17 1300  BP: 117/66 (!) 98/55 (!) 102/55 (!) 100/54  Pulse: 100  64 65  Resp: 15 12 17 14   Temp:    98.1 F (36.7 C)  TempSrc:      SpO2: 100% 99% 100% 100%  Weight:      Height:       Weight change:   Physical Exam: General: alert , chronically ill AAM Heart: RRR , no m,r,g  Lungs: CTA  bilat  Abdomen: Bs pos .,soft , nt,nd  Extremities: most LE edema has resolved Dialysis Access:  L IJ Perm cath / LUA AVG pos bruit    OP Dialysis: TTS [facility] 4h  88.5kg  2/2.25 bath  P4   LUA AVF   Hep 7000 Venofer 50mg  IV qHD Mircera 160mcg IV q2wks - last 2/14 Calcitriol 3.32mcg PO qHD  Sensipar 30mg  qd Renvela 800mg - 2AC TID, 2 w/snacks  Problem/Plan: 1. SP Fall /SDH  With Crainiotomy  12/05/17 - rehab med eval in process/ Diet advanced today For  NG tube out as tolerating diet  2. Clotted LUA AVF thrombosis - prob due to hypotension / today  new  L UA AVGG Insert / L ij perm cath  3. ESRD - HD On TTS  , no hd yest sec clotted acces , hd today/get back on schedule later in hosp.  4. HTN/volume - Cxr without edema today and low bp holding  bp meds 5. Anemia - sp transfusion  hgb 7.5  Aranesp 166mcg  q tues / will incr to 150   6. Secondary hyperparathyroidism - binders now eating  Phos 6.1 /corec Ca 8.8  7. DM type 2- per amdit    Ernest Haber, PA-C [place] [phone] 12/15/2017,2:41 PM  LOS: 10 days   Pt seen, examined and agree w A/P as above.  Kelly Splinter MD [facility] pager [phone]   12/15/2017, 5:01 PM    Labs: Basic Metabolic Panel: Recent Labs  Lab 12/12/17 0931 12/14/17 1939 12/15/17 0603  NA 134* 131* 131*  K 3.6 4.2 4.6  CL 95* 90* 92*  CO2 25 26 23   GLUCOSE 97 87 126*   BUN 45* 84* 92*  CREATININE 4.79* 7.49* 8.01*  CALCIUM 8.3* 7.9* 7.7*  PHOS 5.0* 6.1*  --    Liver Function Tests: Recent Labs  Lab 12/09/17 0430 12/10/17 0517 12/11/17 0352 12/12/17 0931 12/14/17 1939  AST 292* 337* 265*  --   --   ALT 504* 503* 488*  --   --   ALKPHOS 88 120 140*  --   --   BILITOT 1.2 1.8* 1.6*  --   --   PROT 5.3* 5.4* 6.0*  --   --   ALBUMIN 2.5* 2.5* 2.8* 2.6* 2.4*   No results for input(s): LIPASE, AMYLASE in the last 168 hours. No results for input(s): AMMONIA in the last 168 hours. CBC: Recent Labs  Lab 12/09/17 0430 12/10/17 0517 12/11/17 0352 12/14/17 1939  WBC 13.6* 11.2* 8.2 7.8  NEUTROABS 11.6* 9.4* 6.6  --   HGB 7.4* 7.4* 7.4* 7.5*  HCT 22.2* 22.0* 22.4* 22.4*  MCV 89.2 90.9 90.3 92.6  PLT 146* 170 154 194  Cardiac Enzymes: No results for input(s): CKTOTAL, CKMB, CKMBINDEX, TROPONINI in the last 168 hours. CBG: Recent Labs  Lab 12/14/17 1631 12/14/17 2034 12/15/17 0048 12/15/17 0420 12/15/17 1141  GLUCAP 136* 94 162* 142* 95    Studies/Results: Dg Chest Port 1 View  Result Date: 12/15/2017 CLINICAL DATA:  Status post dialysis catheter placement today. EXAM: PORTABLE CHEST 1 VIEW COMPARISON:  Single-view of the chest 12/09/2017. FINDINGS: Previously seen left IJ approach dialysis catheter has been removed and a new catheter is in place. No pneumothorax is identified. Tip of the new catheter projects in the right atrium. There is left basilar airspace disease and a small effusion. Cardiomegaly is noted. No pulmonary edema. Aortic atherosclerosis is identified. Feeding tube is unchanged. IMPRESSION: Tip dialysis of new left catheter IJ approach projects in the right atrium. No pneumothorax. Small left effusion and left basilar airspace disease, likely atelectasis. Cardiomegaly without edema. Atherosclerosis. Electronically Signed   By: Inge Rise M.D.   On: 12/15/2017 13:24   Dg Fluoro Guide Cv Line-no Report  Result Date:  12/15/2017 Fluoroscopy was utilized by the requesting physician.  No radiographic interpretation.   Medications: . sodium chloride Stopped (12/08/17 0939)  . sodium chloride Stopped (12/14/17 1114)  . dextrose Stopped (12/08/17 1554)   . allopurinol  300 mg Oral Daily  . amiodarone  200 mg Oral Daily  . [START ON 12/16/2017] calcitRIOL  0.5 mcg Oral Q T,Th,Sa-HD  . Chlorhexidine Gluconate Cloth  6 each Topical Q2200  . darbepoetin (ARANESP) injection - DIALYSIS  100 mcg Intravenous Q Wed-HD  . [START ON 12/23/2017] darbepoetin (ARANESP) injection - DIALYSIS  100 mcg Intravenous Q Thu-HD  . feeding supplement (NEPRO CARB STEADY)  1,000 mL Per Tube Q24H  . feeding supplement (PRO-STAT SUGAR FREE 64)  30 mL Per Tube BID  . heparin  7,000 Units Dialysis Once in dialysis  . insulin aspart  0-9 Units Subcutaneous TID WC  . mouth rinse  15 mL Mouth Rinse BID  . multivitamin  1 tablet Oral QHS  . mupirocin ointment   Nasal BID  . ondansetron (ZOFRAN) IV  4 mg Intravenous Once  . oxybutynin  5 mg Oral BID  . pantoprazole sodium  40 mg Per Tube QHS  . polyethylene glycol  17 g Oral Daily  . QUEtiapine  25 mg Oral BID  . sevelamer carbonate  2,400 mg Oral TID WC  . sodium chloride flush  10-40 mL Intracatheter Q12H

## 2017-12-15 NOTE — Anesthesia Postprocedure Evaluation (Signed)
Anesthesia Post Note  Patient: Albert Stanley  Procedure(s) Performed: INSERTION OF ARTERIOVENOUS (AV) GORE-TEX GRAFT ARM (Left Arm Upper) INSERTION OF DIALYSIS CATHETER (Left Neck)     Patient location during evaluation: PACU Anesthesia Type: MAC Level of consciousness: awake and alert Pain management: pain level controlled Vital Signs Assessment: post-procedure vital signs reviewed and stable Respiratory status: spontaneous breathing, nonlabored ventilation and respiratory function stable Cardiovascular status: stable and blood pressure returned to baseline Postop Assessment: no apparent nausea or vomiting Anesthetic complications: no    Last Vitals:  Vitals:   12/15/17 1245 12/15/17 1300  BP: (!) 102/55 (!) 100/54  Pulse: 64 65  Resp: 17 14  Temp:  36.7 C  SpO2: 100% 100%    Last Pain:  Vitals:   12/15/17 1300  TempSrc:   PainSc: 0-No pain                 Sharee Sturdy,W. EDMOND

## 2017-12-15 NOTE — Progress Notes (Signed)
Dialysis called to get Pt but Pt is scheduled for a skin graft today. Will pass on in shift change.

## 2017-12-16 ENCOUNTER — Inpatient Hospital Stay (HOSPITAL_COMMUNITY)
Admission: RE | Admit: 2017-12-16 | Discharge: 2018-01-05 | DRG: 939 | Disposition: A | Discharge status: Skilled Nursing Facility | Payer: Medicare Other | Source: Intra-hospital | Attending: Physical Medicine & Rehabilitation | Admitting: Physical Medicine & Rehabilitation

## 2017-12-16 ENCOUNTER — Telehealth: Payer: Self-pay | Admitting: Surgery

## 2017-12-16 ENCOUNTER — Encounter (HOSPITAL_COMMUNITY): Payer: Self-pay

## 2017-12-16 DIAGNOSIS — Z833 Family history of diabetes mellitus: Secondary | ICD-10-CM

## 2017-12-16 DIAGNOSIS — E785 Hyperlipidemia, unspecified: Secondary | ICD-10-CM | POA: Diagnosis present

## 2017-12-16 DIAGNOSIS — N186 End stage renal disease: Secondary | ICD-10-CM | POA: Diagnosis present

## 2017-12-16 DIAGNOSIS — S065X9D Traumatic subdural hemorrhage with loss of consciousness of unspecified duration, subsequent encounter: Secondary | ICD-10-CM | POA: Diagnosis present

## 2017-12-16 DIAGNOSIS — R0989 Other specified symptoms and signs involving the circulatory and respiratory systems: Secondary | ICD-10-CM

## 2017-12-16 DIAGNOSIS — I82441 Acute embolism and thrombosis of right tibial vein: Secondary | ICD-10-CM | POA: Diagnosis present

## 2017-12-16 DIAGNOSIS — E1122 Type 2 diabetes mellitus with diabetic chronic kidney disease: Secondary | ICD-10-CM | POA: Diagnosis present

## 2017-12-16 DIAGNOSIS — I82492 Acute embolism and thrombosis of other specified deep vein of left lower extremity: Secondary | ICD-10-CM | POA: Diagnosis present

## 2017-12-16 DIAGNOSIS — I82411 Acute embolism and thrombosis of right femoral vein: Secondary | ICD-10-CM

## 2017-12-16 DIAGNOSIS — Z87891 Personal history of nicotine dependence: Secondary | ICD-10-CM

## 2017-12-16 DIAGNOSIS — K219 Gastro-esophageal reflux disease without esophagitis: Secondary | ICD-10-CM | POA: Diagnosis present

## 2017-12-16 DIAGNOSIS — E1142 Type 2 diabetes mellitus with diabetic polyneuropathy: Secondary | ICD-10-CM

## 2017-12-16 DIAGNOSIS — M7989 Other specified soft tissue disorders: Secondary | ICD-10-CM | POA: Diagnosis not present

## 2017-12-16 DIAGNOSIS — R1310 Dysphagia, unspecified: Secondary | ICD-10-CM | POA: Diagnosis present

## 2017-12-16 DIAGNOSIS — W010XXD Fall on same level from slipping, tripping and stumbling without subsequent striking against object, subsequent encounter: Secondary | ICD-10-CM | POA: Diagnosis present

## 2017-12-16 DIAGNOSIS — O2230 Deep phlebothrombosis in pregnancy, unspecified trimester: Secondary | ICD-10-CM

## 2017-12-16 DIAGNOSIS — Z96642 Presence of left artificial hip joint: Secondary | ICD-10-CM | POA: Diagnosis present

## 2017-12-16 DIAGNOSIS — S065X1S Traumatic subdural hemorrhage with loss of consciousness of 30 minutes or less, sequela: Secondary | ICD-10-CM | POA: Diagnosis not present

## 2017-12-16 DIAGNOSIS — S065X3S Traumatic subdural hemorrhage with loss of consciousness of 1 hour to 5 hours 59 minutes, sequela: Secondary | ICD-10-CM | POA: Diagnosis not present

## 2017-12-16 DIAGNOSIS — E1121 Type 2 diabetes mellitus with diabetic nephropathy: Secondary | ICD-10-CM | POA: Diagnosis present

## 2017-12-16 DIAGNOSIS — I132 Hypertensive heart and chronic kidney disease with heart failure and with stage 5 chronic kidney disease, or end stage renal disease: Secondary | ICD-10-CM | POA: Diagnosis present

## 2017-12-16 DIAGNOSIS — K5900 Constipation, unspecified: Secondary | ICD-10-CM | POA: Diagnosis present

## 2017-12-16 DIAGNOSIS — S065X9A Traumatic subdural hemorrhage with loss of consciousness of unspecified duration, initial encounter: Secondary | ICD-10-CM | POA: Diagnosis present

## 2017-12-16 DIAGNOSIS — I5032 Chronic diastolic (congestive) heart failure: Secondary | ICD-10-CM | POA: Diagnosis present

## 2017-12-16 DIAGNOSIS — N2581 Secondary hyperparathyroidism of renal origin: Secondary | ICD-10-CM | POA: Diagnosis present

## 2017-12-16 DIAGNOSIS — I953 Hypotension of hemodialysis: Secondary | ICD-10-CM | POA: Diagnosis not present

## 2017-12-16 DIAGNOSIS — D649 Anemia, unspecified: Secondary | ICD-10-CM | POA: Diagnosis not present

## 2017-12-16 DIAGNOSIS — Z992 Dependence on renal dialysis: Secondary | ICD-10-CM

## 2017-12-16 DIAGNOSIS — R0602 Shortness of breath: Secondary | ICD-10-CM

## 2017-12-16 DIAGNOSIS — I10 Essential (primary) hypertension: Secondary | ICD-10-CM | POA: Diagnosis not present

## 2017-12-16 DIAGNOSIS — Z8551 Personal history of malignant neoplasm of bladder: Secondary | ICD-10-CM | POA: Diagnosis not present

## 2017-12-16 DIAGNOSIS — E871 Hypo-osmolality and hyponatremia: Secondary | ICD-10-CM | POA: Diagnosis present

## 2017-12-16 DIAGNOSIS — D631 Anemia in chronic kidney disease: Secondary | ICD-10-CM | POA: Diagnosis present

## 2017-12-16 DIAGNOSIS — I890 Lymphedema, not elsewhere classified: Secondary | ICD-10-CM | POA: Diagnosis present

## 2017-12-16 DIAGNOSIS — Z794 Long term (current) use of insulin: Secondary | ICD-10-CM

## 2017-12-16 DIAGNOSIS — E11649 Type 2 diabetes mellitus with hypoglycemia without coma: Secondary | ICD-10-CM | POA: Diagnosis not present

## 2017-12-16 DIAGNOSIS — I429 Cardiomyopathy, unspecified: Secondary | ICD-10-CM | POA: Diagnosis present

## 2017-12-16 DIAGNOSIS — G8194 Hemiplegia, unspecified affecting left nondominant side: Secondary | ICD-10-CM | POA: Diagnosis present

## 2017-12-16 DIAGNOSIS — I5043 Acute on chronic combined systolic (congestive) and diastolic (congestive) heart failure: Secondary | ICD-10-CM | POA: Diagnosis present

## 2017-12-16 DIAGNOSIS — S065XAA Traumatic subdural hemorrhage with loss of consciousness status unknown, initial encounter: Secondary | ICD-10-CM | POA: Diagnosis present

## 2017-12-16 DIAGNOSIS — Z8261 Family history of arthritis: Secondary | ICD-10-CM

## 2017-12-16 DIAGNOSIS — S065X2S Traumatic subdural hemorrhage with loss of consciousness of 31 minutes to 59 minutes, sequela: Secondary | ICD-10-CM

## 2017-12-16 DIAGNOSIS — I5033 Acute on chronic diastolic (congestive) heart failure: Secondary | ICD-10-CM

## 2017-12-16 DIAGNOSIS — Z8249 Family history of ischemic heart disease and other diseases of the circulatory system: Secondary | ICD-10-CM

## 2017-12-16 LAB — GLUCOSE, CAPILLARY
Glucose-Capillary: 123 mg/dL — ABNORMAL HIGH (ref 65–99)
Glucose-Capillary: 169 mg/dL — ABNORMAL HIGH (ref 65–99)
Glucose-Capillary: 76 mg/dL (ref 65–99)
Glucose-Capillary: 98 mg/dL (ref 65–99)

## 2017-12-16 LAB — RENAL FUNCTION PANEL
ALBUMIN: 2.4 g/dL — AB (ref 3.5–5.0)
Anion gap: 14 (ref 5–15)
BUN: 48 mg/dL — ABNORMAL HIGH (ref 6–20)
CO2: 25 mmol/L (ref 22–32)
Calcium: 7.4 mg/dL — ABNORMAL LOW (ref 8.9–10.3)
Chloride: 92 mmol/L — ABNORMAL LOW (ref 101–111)
Creatinine, Ser: 5.61 mg/dL — ABNORMAL HIGH (ref 0.61–1.24)
GFR calc Af Amer: 11 mL/min — ABNORMAL LOW (ref >60)
GFR calc non Af Amer: 9 mL/min — ABNORMAL LOW (ref >60)
Glucose, Bld: 127 mg/dL — ABNORMAL HIGH (ref 65–99)
PHOSPHORUS: 5.4 mg/dL — AB (ref 2.5–4.6)
POTASSIUM: 4 mmol/L (ref 3.5–5.1)
SODIUM: 131 mmol/L — AB (ref 135–145)

## 2017-12-16 LAB — CBC
HCT: 23.9 % — ABNORMAL LOW (ref 39.0–52.0)
Hemoglobin: 7.8 g/dL — ABNORMAL LOW (ref 13.0–17.0)
MCH: 30.4 pg (ref 26.0–34.0)
MCHC: 32.6 g/dL (ref 30.0–36.0)
MCV: 93 fL (ref 78.0–100.0)
Platelets: 199 10*3/uL (ref 150–400)
RBC: 2.57 MIL/uL — ABNORMAL LOW (ref 4.22–5.81)
RDW: 18.5 % — ABNORMAL HIGH (ref 11.5–15.5)
WBC: 7 10*3/uL (ref 4.0–10.5)

## 2017-12-16 MED ORDER — PENTAFLUOROPROP-TETRAFLUOROETH EX AERO: 1.0000 "application " | INHALATION_SPRAY | CUTANEOUS | Status: DC | PRN

## 2017-12-16 MED ORDER — LIDOCAINE-PRILOCAINE 2.5-2.5 % EX CREA
1.0000 "application " | TOPICAL_CREAM | CUTANEOUS | Status: DC | PRN
  Filled 2017-12-16: qty 5

## 2017-12-16 MED ORDER — ALLOPURINOL 100 MG PO TABS
300.0000 mg | ORAL_TABLET | Freq: Every day | ORAL | Status: DC
  Administered 2017-12-17 – 2018-01-05 (×20): 300 mg via ORAL
  Filled 2017-12-16 (×22): qty 3

## 2017-12-16 MED ORDER — CALCITRIOL 0.5 MCG PO CAPS
ORAL_CAPSULE | ORAL | Status: AC
  Filled 2017-12-16: qty 1

## 2017-12-16 MED ORDER — ONDANSETRON HCL 4 MG PO TABS
4.0000 mg | ORAL_TABLET | Freq: Four times a day (QID) | ORAL | Status: DC | PRN
  Administered 2017-12-28 – 2018-01-05 (×4): 4 mg via ORAL
  Filled 2017-12-16 (×4): qty 1

## 2017-12-16 MED ORDER — ALBUTEROL SULFATE (2.5 MG/3ML) 0.083% IN NEBU: 2.5000 mg | INHALATION_SOLUTION | RESPIRATORY_TRACT | Status: DC | PRN

## 2017-12-16 MED ORDER — AMIODARONE HCL 200 MG PO TABS
200.0000 mg | ORAL_TABLET | Freq: Every day | ORAL | Status: DC
  Administered 2017-12-17 – 2018-01-05 (×20): 200 mg via ORAL
  Filled 2017-12-16 (×20): qty 1

## 2017-12-16 MED ORDER — SODIUM CHLORIDE 0.9 % IV SOLN: 100.0000 mL | INTRAVENOUS | Status: DC | PRN

## 2017-12-16 MED ORDER — CALCITRIOL 0.5 MCG PO CAPS
0.5000 ug | ORAL_CAPSULE | ORAL | Status: DC
  Administered 2017-12-18 – 2018-01-04 (×7): 0.5 ug via ORAL
  Filled 2017-12-16 (×7): qty 1

## 2017-12-16 MED ORDER — PRO-STAT SUGAR FREE PO LIQD
30.0000 mL | Freq: Two times a day (BID) | ORAL | Status: DC
  Administered 2017-12-16: 30 mL
  Filled 2017-12-16 (×2): qty 30

## 2017-12-16 MED ORDER — SEVELAMER CARBONATE 800 MG PO TABS
2400.0000 mg | ORAL_TABLET | Freq: Three times a day (TID) | ORAL | Status: DC
  Administered 2017-12-17 – 2017-12-19 (×4): 2400 mg via ORAL
  Filled 2017-12-16 (×7): qty 3

## 2017-12-16 MED ORDER — MUPIROCIN 2 % EX OINT
TOPICAL_OINTMENT | Freq: Two times a day (BID) | CUTANEOUS | Status: DC
  Administered 2017-12-17 – 2017-12-24 (×13): via NASAL
  Administered 2017-12-25: 1 via NASAL
  Administered 2017-12-25 – 2017-12-26 (×2): via NASAL
  Administered 2017-12-26: 1 via NASAL
  Administered 2017-12-27 – 2018-01-05 (×19): via NASAL
  Filled 2017-12-16: qty 22

## 2017-12-16 MED ORDER — DARBEPOETIN ALFA 150 MCG/0.3ML IJ SOSY
150.0000 ug | PREFILLED_SYRINGE | INTRAMUSCULAR | Status: DC
  Administered 2017-12-16: 150 ug via INTRAVENOUS

## 2017-12-16 MED ORDER — OXYBUTYNIN CHLORIDE 5 MG/5ML PO SYRP
5.0000 mg | ORAL_SOLUTION | Freq: Two times a day (BID) | ORAL | Status: DC
  Administered 2017-12-16 – 2018-01-05 (×40): 5 mg via ORAL
  Filled 2017-12-16 (×47): qty 5

## 2017-12-16 MED ORDER — INSULIN ASPART 100 UNIT/ML ~~LOC~~ SOLN
0.0000 [IU] | Freq: Three times a day (TID) | SUBCUTANEOUS | Status: DC
  Administered 2017-12-17 (×2): 2 [IU] via SUBCUTANEOUS
  Administered 2017-12-18 (×3): 1 [IU] via SUBCUTANEOUS
  Administered 2017-12-19 (×2): 2 [IU] via SUBCUTANEOUS
  Administered 2017-12-20: 1 [IU] via SUBCUTANEOUS
  Administered 2017-12-20: 2 [IU] via SUBCUTANEOUS
  Administered 2017-12-21 – 2017-12-25 (×3): 1 [IU] via SUBCUTANEOUS
  Administered 2017-12-26: 2 [IU] via SUBCUTANEOUS
  Administered 2017-12-27: 3 [IU] via SUBCUTANEOUS
  Administered 2017-12-28: 1 [IU] via SUBCUTANEOUS
  Administered 2017-12-29 – 2018-01-03 (×4): 2 [IU] via SUBCUTANEOUS
  Administered 2018-01-04: 1 [IU] via SUBCUTANEOUS
  Administered 2018-01-05 (×2): 2 [IU] via SUBCUTANEOUS

## 2017-12-16 MED ORDER — HEPARIN SODIUM (PORCINE) 1000 UNIT/ML DIALYSIS
3500.0000 [IU] | INTRAMUSCULAR | Status: DC | PRN
  Filled 2017-12-16: qty 4

## 2017-12-16 MED ORDER — ONDANSETRON HCL 4 MG/2ML IJ SOLN
4.0000 mg | Freq: Four times a day (QID) | INTRAMUSCULAR | Status: DC | PRN
  Administered 2017-12-25: 4 mg via INTRAVENOUS
  Filled 2017-12-16: qty 2

## 2017-12-16 MED ORDER — ACETAMINOPHEN 325 MG PO TABS
650.0000 mg | ORAL_TABLET | Freq: Four times a day (QID) | ORAL | Status: DC | PRN
  Administered 2017-12-17 – 2017-12-20 (×3): 650 mg via ORAL
  Filled 2017-12-16 (×4): qty 2

## 2017-12-16 MED ORDER — HEPARIN SODIUM (PORCINE) 1000 UNIT/ML DIALYSIS: 1000.0000 [IU] | INTRAMUSCULAR | Status: DC | PRN

## 2017-12-16 MED ORDER — PANTOPRAZOLE SODIUM 40 MG PO PACK
40.0000 mg | PACK | Freq: Every day | ORAL | Status: DC
  Administered 2017-12-16 – 2017-12-17 (×2): 40 mg
  Filled 2017-12-16 (×2): qty 20

## 2017-12-16 MED ORDER — MUSCLE RUB 10-15 % EX CREA
TOPICAL_CREAM | CUTANEOUS | Status: DC | PRN
  Administered 2017-12-21 – 2017-12-23 (×2): via TOPICAL
  Administered 2017-12-25: 1 via TOPICAL
  Administered 2017-12-30: via TOPICAL
  Filled 2017-12-16: qty 85

## 2017-12-16 MED ORDER — NEPRO/CARBSTEADY PO LIQD
1000.0000 mL | ORAL | Status: DC
  Filled 2017-12-16 (×2): qty 1000

## 2017-12-16 MED ORDER — DARBEPOETIN ALFA 150 MCG/0.3ML IJ SOSY
PREFILLED_SYRINGE | INTRAMUSCULAR | Status: AC
  Filled 2017-12-16: qty 0.3

## 2017-12-16 MED ORDER — RENA-VITE PO TABS
1.0000 | ORAL_TABLET | Freq: Every day | ORAL | Status: DC
  Administered 2017-12-16 – 2018-01-04 (×20): 1 via ORAL
  Filled 2017-12-16 (×20): qty 1

## 2017-12-16 MED ORDER — SORBITOL 70 % SOLN
30.0000 mL | Freq: Every day | Status: DC | PRN
  Administered 2017-12-16 – 2018-01-03 (×3): 30 mL via ORAL
  Filled 2017-12-16 (×5): qty 30

## 2017-12-16 MED ORDER — OXYCODONE-ACETAMINOPHEN 7.5-325 MG PO TABS
1.0000 | ORAL_TABLET | Freq: Four times a day (QID) | ORAL | Status: DC | PRN
  Administered 2017-12-16 – 2018-01-05 (×54): 1 via ORAL
  Filled 2017-12-16 (×56): qty 1

## 2017-12-16 MED ORDER — POLYETHYLENE GLYCOL 3350 17 G PO PACK
17.0000 g | PACK | Freq: Every day | ORAL | Status: DC
  Administered 2017-12-17 – 2018-01-05 (×19): 17 g via ORAL
  Filled 2017-12-16 (×20): qty 1

## 2017-12-16 NOTE — Telephone Encounter (Signed)
-----   Message from Mena Goes, RN sent at 12/16/2017 11:22 AM EST ----- Regarding: VWB appt 2-3 weeks   ----- Message ----- From: Gabriel Earing, PA-C Sent: 12/16/2017  11:10 AM To: Vvs Charge Pool  S/p LUA AVG.  F/u with Dr. Trula Slade in a couple of weeks to evaluate drainage and swelling of left arm s/p surgery.  Thanks

## 2017-12-16 NOTE — Discharge Instructions (Signed)
[facility]  Discharge Instructions  AV Fistula or Graft Surgery for Dialysis Access  Please refer to the following instructions for your post-procedure care. Your surgeon or physician assistant will discuss any changes with you.  Activity  You may drive the day following your surgery, if you are comfortable and no longer taking prescription pain medication. Resume full activity as the soreness in your incision resolves.  Bathing/Showering  You may shower after you go home. Keep your incision dry for 48 hours. Do not soak in a bathtub, hot tub, or swim until the incision heals completely. You may not shower if you have a hemodialysis catheter.  Incision Care  Clean your incision with mild soap and water after 48 hours. Pat the area dry with a clean towel. You do not need a bandage unless otherwise instructed. Do not apply any ointments or creams to your incision. You may have skin glue on your incision. Do not peel it off. It will come off on its own in about one week. Your arm may swell a bit after surgery. To reduce swelling use pillows to elevate your arm so it is above your heart. Your doctor will tell you if you need to lightly wrap your arm with an ACE bandage.  Diet  Resume your normal diet. There are not special food restrictions following this procedure. In order to heal from your surgery, it is CRITICAL to get adequate nutrition. Your body requires vitamins, minerals, and protein. Vegetables are the best source of vitamins and minerals. Vegetables also provide the perfect balance of protein. Processed food has little nutritional value, so try to avoid this.  Medications  Resume taking all of your medications. If your incision is causing pain, you may take over-the counter pain relievers such as acetaminophen (Tylenol). If you were prescribed a stronger pain medication, please be aware these medications can cause nausea and constipation. Prevent  nausea by taking the medication with a snack or meal. Avoid constipation by drinking plenty of fluids and eating foods with high amount of fiber, such as fruits, vegetables, and grains.  Do not take Tylenol if you are taking prescription pain medications.  Follow up Your surgeon may want to see you in the office following your access surgery. If so, this will be arranged at the time of your surgery.  Please call us immediately for any of the following conditions:  Increased pain, redness, drainage (pus) from your incision site Fever of 101 degrees or higher Severe or worsening pain at your incision site Hand pain or numbness.  Reduce your risk of vascular disease:  Stop smoking. If you would like help, call [facility] at 1-800-QUIT-NOW 340-186-3352) or [facility] your cholesterol Maintain a desired weight Control your diabetes Keep your blood pressure down  Dialysis  It will take several weeks to several months for your new dialysis access to be ready for use. Your surgeon will determine when it is okay to use it. Your nephrologist will continue to direct your dialysis. You can continue to use your Permcath until your new access is ready for use.   12/16/2017 Albert Stanley [id number] 1946-08-04  Surgeon(s): Serafina Mitchell, MD  Procedure(s): INSERTION OF ARTERIOVENOUS (AV) GORE-TEX GRAFT ARM INSERTION OF DIALYSIS CATHETER  x Do not stick graft for 4 weeks    If you have any questions, please call the office at [phone].  Craniotomy Care After Please read the instructions outlined below and  refer to this sheet in the next few weeks. These discharge instructions provide you with general information on caring for yourself after you leave the hospital. Your surgeon may also give you specific instructions. While your treatment has been planned according to the most current medical practices available, unavoidable complications occasionally  occur. If you have any problems or questions after discharge, please call your surgeon. Although there are many types of brain surgery, recovery following craniotomy (surgical opening of the skull) is much the same for each. However, recovery depends on many factors. These include the type and severity of brain injury and the type of surgery. It also depends on any nervous system function problems (neurological deficits) before surgery. If the craniotomy was done for cancer, chemotherapy and radiation could follow. You could be in the hospital from 5 days to a couple weeks. This depends on the type of surgery, findings, and whether there are complications. HOME CARE INSTRUCTIONS   It is not unusual to hear a clicking noise after a craniotomy, the plates and screws used to attach the bone flap can sometimes cause this. It is a normal occurrence if this does happen  Do not drive for 10 days after the operation  Your scalp may feel spongy for a while, because of fluid under it. This will gradually get better. Occasionally, the surgeon will not replace the bone that was removed to access the brain. If there is a bony defect, the surgeon will ask you to wear a helmet for protection. This is a discussion you should have with your surgeon prior to leaving the hospital (discharge).  Numbness may persist in some areas of your scalp.  Take all medications as directed. Sometimes steroids to control swelling are prescribed. Anticonvulsants to prevent seizures may also be given. Do not use alcohol, other drugs, or medications unless your surgeon says it is OK.  Keep the wound dry and clean. The wound may be washed gently with soap and water. Then, you may gently blot or dab it dry, without rubbing. Do not take baths, use swimming pools or hot tubs for 10 days, or as instructed by your caregiver. It is best to wait to see you surgeon at your first postoperative visit, and to get directions at that time.  Only take  over-the-counter or prescription medicines for pain, discomfort, or fever as directed by your caregiver.  You may continue your normal diet, as directed.  Walking is OK for exercise. Wait at least 3 months before you return to mild, non-contact sports or as your surgeon suggests. Contact sports should be avoided for at least 1 year, unless your surgeon says it is OK.  If you are prescribed steroids, take them exactly as prescribed. If you start having a decrease in nervous system functions (neurological deficits) and headaches as the dose of steroids is reduced, tell your surgeon right away.  When the anticonvulsant prescription is finished you no longer need to take it. SEEK IMMEDIATE MEDICAL CARE IF:   You develop nausea, vomiting, severe headaches, confusion, or you have a seizure.  You develop chest pain, a stiff neck, or difficulty breathing.  There is redness, swelling, or increasing pain in the wound or pin insertion sites.  You have an increase in swelling or bruising around the eyes.  There is drainage or pus coming from the wound.  You have an oral temperature above 102 F (38.9 C), not controlled by medicine.  You notice a foul smell coming from the wound  or dressing.  The wound breaks open (edges not staying together) after the stitches have been removed.  You develop dizziness or fainting while standing.  You develop a rash.  You develop any reaction or side effects to the medications given. Document Released: 12/29/2005 Document Revised: 12/21/2011 Document Reviewed: 10/07/2009 [facility] Patient Information 2013 [place].

## 2017-12-16 NOTE — Progress Notes (Addendum)
  Postoperative hemodialysis access     Date of Surgery:  12/15/17 Surgeon: Trula Slade  Subjective:  C/o soreness/pain in his left upper arm; denies any hand pain   PHYSICAL EXAMINATION:  Vitals:   12/16/17 0523 12/16/17 0835  BP: 127/70 118/74  Pulse: 86 84  Resp: 18 20  Temp: 98.7 F (37.1 C) 98.4 F (36.9 C)  SpO2: 100% 100%    Incision is clean; there is some serosanguinous drainage from the distal incision Sensation in digits is intact;  There is bruit. The graft/fistula is palpable    ASSESSMENT/PLAN:  Albert Stanley is a 72 y.o. year old male who is s/p removal of temporary left IJ dialysis catheter and placement of TDC as well as LUA AVG.  -graft/fistula is patent -pt does not have evidence of steal sx -he does have some swelling and small area of blistering on the upper arm.  There is a raw surface by the distal anastomosis where the blister drained.  He does have some serosanguinous drainage from the distal incision.  Does not appear infected.  He will need to keep his arm elevated.  Will continue with observation for now.  May need fistulogram in the future if he has continued swelling -f/u with Dr. Trula Slade in 2 weeks.    Leontine Locket, PA-C [facility] [phone]   I agree with the above.  Continue to keep arm elevated.  May need shuntogram if swelling persists, however would delay this for at least 2 weeks.  Annamarie Major

## 2017-12-16 NOTE — Care Management Note (Signed)
Case Management Note  Patient Details  Name: Albert Stanley MRN: [id number] Date of Birth: [birth year]  Subjective/Objective:                    Action/Plan: Pt discharging to CIR today. CM signing off.    Expected Discharge Date:                  Expected Discharge Plan:  [place]  In-House Referral:     Discharge planning Services  CM Consult  Post Acute Care Choice:    Choice offered to:     DME Arranged:    DME Agency:     HH Arranged:    [place] Agency:     Status of Service:  Completed, signed off  If discussed at [employer] of Stay Meetings, dates discussed:    Additional Comments:  Pollie Friar, RN 12/16/2017, 2:38 PM

## 2017-12-16 NOTE — PMR Pre-admission (Signed)
PMR Admission Coordinator Pre-Admission Assessment  Patient: Albert Stanley is an 72 y.o., male MRN: [id number] DOB: 05/26/1946 Height: 5\' 9"  (175.3 cm) Weight: 95 kg (209 lb 7 oz)           Insurance Information HMO:     PPO: X     PCP:      IPA:      80/20:      OTHER:  PRIMARY: U[employer] Medicare       Policy#: [id number]      Subscriber: Self CM Name: Vevelyn Royals      Phone#:  [phone]     Fax#: [phone] Pre-Cert#: [id number]      Employer: Retired  Benefits:  Phone #: [phone]     Name: [email or web address] Eff. Date: 10/12/17     Deduct: $0      Out of Pocket Max: N/A      Life Max: N/A CIR: $160 a day, days 1-10; $0 a day, days 11+      SNF: $0 a day, days 1-20; $50 a da, days 21-100 Outpatient: Necessity      Co-Pay: $20 Home Health: Necessity, 100%      Co-Pay: $0 DME: 80%     Co-Pay: 20% Providers: In-network   SECONDARY: None        Medicaid Application Date:       Case Manager:  Disability Application Date:       Case Worker:   Emergency Contact Information Contact Information    Name Relation Home Work [place] Spouse [phone]  [phone]   Hershel, Corkery Daughter   [phone]     Current Medical History  Patient Admitting Diagnosis: TBI   History of Present Illness: A 72 y.o.right handed malewith history of bladder cancer with resection in 1999 lymphedema, end-stage renal disease with hemodialysis, diastolic congestive heart failure, diabetes mellitus, hypertension, nonischemic cardiomyopathy.History taken from chart review.Patient lives with spouseand daughter. Independent with assistive device prior to admission. Independent ADLs and still driving.Wife works during the day. His daughter is in and out during the day.Presented 2/24/2019with fatigue, vomiting and diarrhea 2 days as well as headache. He had missed his last dialysis session. Noted fall 12/03/2017 when he tripped on the carpet. Denied striking his head. Cranial CTreviewed  showing right SDH. Per report, large right side subdural hematoma measuring 16 mm in width with significant mass effect and a 13 mm right to left midline shift. Mass effect effaces the sulci and right lateral ventricle. Uncal herniation present. Neurosurgery consulted underwent right frontotemporal parietal craniotomy hematoma evacuation 12/05/2017 per Dr. Cyndy Freeze. Hospital course hemodialysis ongoing with conversion of left temporary dialysis catheter to tunneled dialysis catheter after his left BC AVF thrombosed 12/15/2017 per Dr. Trula Slade. Intermittent bouts of restlessness maintained on Seroquel. Acute on chronic anemia 7.5 and monitored .Dysphagia #1 thin liquid diet. Nasogastric tube remains in place. MRSA PCR screening positive maintained on contact precautions.Physical and occupational therapy evaluations completed with recommendations of physical medicine rehabilitation consult. Patient to be admitted for a comprehensive inpatient rehabilitation program.  Past Medical History  Past Medical History:  Diagnosis Date  . Arthritis    Osteoarthritis  . Asthma   . Bladder cancer ([place]) dx'd 1990   surg only  . CHF (congestive heart failure) ([place])   . Diabetes mellitus without complication ([place])   . ESRD (end stage renal disease) ([name])    Tues, Thurs, Sat dialysis  . GERD (gastroesophageal reflux disease)   . History of cardiac catheterization  a. LHC 5/17: no obstructive CAD  . History of DVT (deep vein thrombosis)    2003 ish  . History of nuclear stress test    a. Myoview 4/17: EF 39%, inf, inf-lat, apical inf, apical lat, apical scar, intermediate risk  . Hyperlipidemia   . Hypertension   . NICM (nonischemic cardiomyopathy) ([place])    a. Echo 2/17: mod LVH, EF 35-40%, inf-lat and inf-sept HK, mod MR, severe LAE, small pericardial effusion    Family History  family history includes Alcohol abuse in his brother; Arthritis in his mother; Cancer in his father; Diabetes in his daughter;  Heart attack in his paternal uncle; Heart disease in his father; Hypertension in his sister.  Prior Rehab/Hospitalizations: Had left arm fistula surgery recently.  Has the patient had major surgery during 100 days prior to admission? Yes, left arm fistual surgery  Current Medications   Current Facility-Administered Medications:  .  0.9 %  sodium chloride infusion, 250 mL, Intravenous, PRN, Omar Person, NP, Stopped at 12/08/17 [phone] .  0.9 %  sodium chloride infusion, , Intravenous, Once, Roney Jaffe, MD, Stopped at 12/14/17 1114 .  0.9 %  sodium chloride infusion, 100 mL, Intravenous, PRN, Roney Jaffe, MD .  acetaminophen (TYLENOL) tablet 650 mg, 650 mg, Oral, Q6H PRN, Etheleen Nicks, MD, 650 mg at 12/10/17 0856 .  albuterol (PROVENTIL) (2.5 MG/3ML) 0.083% nebulizer solution 2.5 mg, 2.5 mg, Nebulization, Q4H PRN, Hammonds, Sharyn Blitz, MD .  allopurinol (ZYLOPRIM) tablet 300 mg, 300 mg, Oral, Daily, Ashok Pall, MD, 300 mg at 12/15/17 1359 .  amiodarone (PACERONE) tablet 200 mg, 200 mg, Oral, Daily, Ashok Pall, MD, 200 mg at 12/15/17 1359 .  calcitRIOL (ROCALTROL) capsule 0.5 mcg, 0.5 mcg, Oral, Q T,Th,Sa-HD, Ashok Pall, MD .  Chlorhexidine Gluconate Cloth 2 % PADS 6 each, 6 each, Topical, Q2200, Ashok Pall, MD, 6 each at 12/15/17 2300 .  [START ON 12/23/2017] Darbepoetin Alfa (ARANESP) injection 150 mcg, 150 mcg, Intravenous, Q Thu-HD, Roney Jaffe, MD .  dextrose 10 % infusion, , Intravenous, Continuous, Etheleen Nicks, MD, Stopped at 12/08/17 1554 .  diphenhydrAMINE (BENADRYL) capsule 25 mg, 25 mg, Oral, Q6H PRN, Ashok Pall, MD, 25 mg at 12/16/17 1020 .  feeding supplement (NEPRO CARB STEADY) liquid 1,000 mL, 1,000 mL, Per Tube, Q24H, Etheleen Nicks, MD, Stopped at 12/14/17 1341 .  feeding supplement (PRO-STAT SUGAR FREE 64) liquid 30 mL, 30 mL, Per Tube, BID, Etheleen Nicks, MD, 30 mL at 12/14/17 2159 .  haloperidol (HALDOL) tablet 5 mg, 5 mg, Oral, Q8H  PRN, 5 mg at 12/11/17 2332 **OR** haloperidol lactate (HALDOL) injection 5 mg, 5 mg, Intravenous, Q6H PRN, Etheleen Nicks, MD .  heparin injection 1,000 Units, 1,000 Units, Dialysis, PRN, Roney Jaffe, MD, 1,000 Units at 12/15/17 2128 .  heparin injection 3,500 Units, 3,500 Units, Dialysis, PRN, Roney Jaffe, MD .  heparin injection 7,000 Units, 7,000 Units, Dialysis, Once in dialysis, Roney Jaffe, MD .  insulin aspart (novoLOG) injection 0-9 Units, 0-9 Units, Subcutaneous, TID WC, Velvet Bathe, MD, 2 Units at 12/16/17 0801 .  lidocaine-prilocaine (EMLA) cream 1 application, 1 application, Topical, PRN, Roney Jaffe, MD .  MEDLINE mouth rinse, 15 mL, Mouth Rinse, BID, Etheleen Nicks, MD, 15 mL at 12/14/17 2156 .  multivitamin (RENA-VIT) tablet 1 tablet, 1 tablet, Oral, QHS, Ashok Pall, MD, 1 tablet at 12/14/17 2148 .  mupirocin ointment (BACTROBAN) 2 %, , Nasal, BID, Etheleen Nicks, MD .  MUSCLE  RUB CREA, , Topical, PRN, Etheleen Nicks, MD .  ondansetron [facility]) injection 4 mg, 4 mg, Intravenous, Once, Tyrone Nine, Dan, DO .  oxybutynin (DITROPAN) 5 MG/5ML syrup 5 mg, 5 mg, Oral, BID, Etheleen Nicks, MD, 5 mg at 12/14/17 2150 .  oxyCODONE-acetaminophen (PERCOCET) 7.5-325 MG per tablet 1 tablet, 1 tablet, Oral, Q6H PRN, Etheleen Nicks, MD, 1 tablet at 12/16/17 1019 .  pantoprazole sodium (PROTONIX) 40 mg/20 mL oral suspension 40 mg, 40 mg, Per Tube, QHS, Ashok Pall, MD, 40 mg at 12/14/17 2150 .  pentafluoroprop-tetrafluoroeth (GEBAUERS) aerosol 1 application, 1 application, Topical, PRN, Roney Jaffe, MD .  polyethylene glycol (MIRALAX / GLYCOLAX) packet 17 g, 17 g, Oral, Daily, Etheleen Nicks, MD, 17 g at 12/15/17 1513 .  sevelamer carbonate (RENVELA) tablet 2,400 mg, 2,400 mg, Oral, TID WC, Ashok Pall, MD, 2,400 mg at 12/16/17 1017 .  sodium chloride flush (NS) 0.9 % injection 10-40 mL, 10-40 mL, Intracatheter, Q12H, Ashok Pall, MD, 10 mL at 12/15/17 2300 .  sodium  chloride flush (NS) 0.9 % injection 10-40 mL, 10-40 mL, Intracatheter, PRN, Ashok Pall, MD  Patients Current Diet: DIET - DYS 1 Room service appropriate? Yes; Fluid consistency: Thin  Precautions / Restrictions Precautions Precautions: Fall Restrictions Weight Bearing Restrictions: No   Has the patient had 2 or more falls or a fall with injury in the past year?Yes  Prior Activity Level Limited Community (1-2x/wk): Prior to admission patient was independnet at home wiith use of assistive devices.  He got out into the community for HD and medical visits.   Home Assistive Devices / Equipment Home Assistive Devices/Equipment: None Home Equipment: Walker - 2 wheels, Crutches  Prior Device Use: Indicate devices/aids used by the patient prior to current illness, exacerbation or injury? Walker and crutch and stair lift   Prior Functional Level Prior Function Level of Independence: Independent with assistive device(s) Comments: Family reports pt does not drive.     Self Care: Did the patient need help bathing, dressing, using the toilet or eating?Independent  Indoor Mobility: Did the patient need assistance with walking from room to room (with or without device)? Independent  Stairs: Did the patient need assistance with internal or external stairs (with or without device)? Needed some help  Functional Cognition: Did the patient need help planning regular tasks such as shopping or remembering to take medications? Needed some help  Current Functional Level Cognition  Arousal/Alertness: Awake/alert Overall Cognitive Status: Impaired/Different from baseline Current Attention Level: Sustained, Selective Orientation Level: Oriented to person, Oriented to place, Oriented to situation, Disoriented to time Following Commands: Follows one step commands consistently Safety/Judgement: Decreased awareness of safety, Decreased awareness of deficits General Comments: Pt perseverates on his  medications and need to have those brought to hospital.  He frequently self distracts  Attention: Focused, Sustained Focused Attention: Appears intact Sustained Attention: Impaired Sustained Attention Impairment: Verbal basic, Functional basic Awareness: Impaired Awareness Impairment: Intellectual impairment Problem Solving: Impaired Problem Solving Impairment: Verbal basic, Functional basic Executive Function: (all impaired by lower level deficits) Safety/Judgment: Impaired    Extremity Assessment (includes Sensation/Coordination)  Upper Extremity Assessment: RUE deficits/detail RUE Deficits / Details: Grossly 4/5  RUE Sensation: (pt reports intact light touch to palpation) LUE Deficits / Details: Pt demonstrates strength grossly 3+/5.   He is slow to initiate movement with Lt UE  LUE Coordination: decreased fine motor, decreased gross motor  Lower Extremity Assessment: Defer to PT evaluation RLE Deficits / Details: weak, but functional at  4/5, decreased focus on MMT LLE Deficits / Details: moves grossly with assist, uncoordinated, pt inattentive to L LE.  little isolation.    ADLs  Overall ADL's : Needs assistance/impaired Eating/Feeding: Moderate assistance, Bed level, Sitting Grooming: Wash/dry hands, Wash/dry face, Minimal assistance, Sitting Upper Body Bathing: Moderate assistance, Sitting Lower Body Bathing: Maximal assistance, Bed level Upper Body Dressing : Moderate assistance, Sitting Lower Body Dressing: Total assistance, Bed level Toilet Transfer: Total assistance Toileting- Clothing Manipulation and Hygiene: Bed level, Total assistance Functional mobility during ADLs: Maximal assistance, +2 for physical assistance, +2 for safety/equipment    Mobility  Overal bed mobility: Needs Assistance Bed Mobility: Supine to Sit, Sit to Supine Rolling: Mod assist Sidelying to sit: Max assist Supine to sit: Mod assist Sit to supine: Mod assist General bed mobility comments:  Pt requires assist to move LEs on and off bed and min A to lift trunk     Transfers  Overall transfer level: Needs assistance Transfer via Lift Equipment: Stedy Transfers: Sit to/from Stand Sit to Stand: +2 physical assistance, +2 safety/equipment, From elevated surface, Total assist  Lateral/Scoot Transfers: Max assist, From elevated surface General transfer comment: transfer from bed to recliner chair. multimodal cues for sequencing and attention to task. mod to max assist +2 for anterior translation and boost up from bed    Ambulation / Gait / Stairs / Wheelchair Mobility  Ambulation/Gait General Gait Details: unable at this time    Posture / Balance Dynamic Sitting Balance Sitting balance - Comments: Pt able to maintain EOB sitting x 15 mins with min guard assist  Balance Overall balance assessment: Needs assistance Sitting-balance support: Feet supported Sitting balance-Leahy Scale: Fair Sitting balance - Comments: Pt able to maintain EOB sitting x 15 mins with min guard assist  Standing balance-Leahy Scale: Zero    Special needs/care consideration BiPAP/CPAP: No CPM: No Continuous Drip IV: No Dialysis: Yes        Days: T,Th,Sat Life Vest: No Oxygen: None PTA Special Bed: High low bed with floor mats Trach Size: No Wound Vac (area): No       Skin: Dry, Ecchymosis to bilateral arms and hips; Skin tear left ear; History of lymphoedema to bilateral lower extremities that were being wrapped PTA, now resolved per wife Bowel mgmt: Incontinent, last BM 3/1 Bladder mgmt: Long-term indwelling foley PTA Diabetic mgmt: Yes, with CBG checks and oral medication     Previous Home Environment Living Arrangements: Spouse/significant other  Lives With: Spouse Available Help at Discharge: Family, Available PRN/intermittently Type of Home: House Home Layout: Two level Alternate Level Stairs-Rails: (chair lift ) Alternate Level Stairs-Number of Steps: flight Home Access: Stairs to  enter Entrance Stairs-Rails: Right, Left Entrance Stairs-Number of Steps: 5 Bathroom Shower/Tub: Multimedia programmer: Handicapped height Home Care Services: No  Discharge Living Setting Plans for Discharge Living Setting: Patient's home, Lives with (comment)(Spouse) Type of Home at Discharge: House Discharge Home Layout: Multi-level, Bed/bath upstairs Alternate Level Stairs-Rails: (has a stair lift to go upstairs) Discharge Home Access: Other (comment)(will have a ramped entrance prior to returning home per spou) Discharge Bathroom Shower/Tub: Walk-in shower Discharge Bathroom Toilet: Handicapped height Discharge Bathroom Accessibility: (spouse unsure if accessible with walker used a crutch PTA) Does the patient have any problems obtaining your medications?: No  Social/Family/Support Systems Patient Roles: Spouse, Parent Contact Information: Spouse: Kathrynn Ducking Anticipated Caregiver: Spouse  Anticipated Ambulance person Information: [phone] Ability/Limitations of Caregiver: Spouse has worked at [employer] for 6 months  Caregiver Availability: (Aware  of 24/7 recommendations and willing to hire assist ) Discharge Plan Discussed with Primary Caregiver: Yes Is Caregiver In Agreement with Plan?: Yes Does Caregiver/Family have Issues with Lodging/Transportation while Pt is in Rehab?: No  Goals/Additional Needs Patient/Family Goal for Rehab: PT/OT/SLP: Min A Expected length of stay: 16-19 days  Cultural Considerations: [employer] Dietary Needs: Dys.1 textures and thin liquids  Equipment Needs: TBD Additional Information: T,Th,Sat Pt/Family Agrees to Admission and willing to participate: Yes Program Orientation Provided & Reviewed with Pt/Caregiver Including Roles  & Responsibilities: Yes Additional Information Needs: Hired care giver resources Information Needs to be Provided By: CSW  Barriers to Discharge: Decreased caregiver support, Home environment  access/layout, Other (comments)(Discussed potential benefits of a bowel program given layout)  Decrease burden of Care through IP rehab admission: No  Possible need for SNF placement upon discharge: Not anticipated at this time  Patient Condition: This patient's medical and functional status has changed since the consult dated: 12/13/17 in which the Rehabilitation Physician determined and documented that the patient's condition is appropriate for intensive rehabilitative care in an inpatient rehabilitation facility. See "History of Present Illness" (above) for medical update. Functional changes are:  Currently requiring total assist +2 for transfers.  Patient's medical and functional status update has been discussed with the Rehabilitation physician and patient remains appropriate for inpatient rehabilitation. Will admit to inpatient rehab today.  Preadmission Screen Completed By:  Retta Diones, 12/16/2017 12:35 PM ______________________________________________________________________   Discussed status with Dr. Naaman Plummer on 12/16/17 at 1235 and received telephone approval for admission today.  Admission Coordinator:  Retta Diones, time 1235/Date 12/16/17

## 2017-12-16 NOTE — Progress Notes (Signed)
OT Cancellation Note  Patient Details Name: Albert Stanley MRN: [id number] DOB: August 20, 1946   Cancelled Treatment:    Reason Eval/Treat Not Completed: Patient at procedure or test/ unavailable; preparing to go to HD, will follow up as schedule permits and as pt is available.  Lou Cal, OT Pager [phone] 12/16/2017   [name] 12/16/2017, 10:13 AM

## 2017-12-16 NOTE — Progress Notes (Signed)
Subjective:  Some LUE swelling after access surgery yest  Objective Vital signs in last 24 hours: Vitals:   12/15/17 2130 12/16/17 0020 12/16/17 0523 12/16/17 0835  BP: (!) 122/50 129/74 127/70 118/74  Pulse: 72 89 86 84  Resp: 12 18 18 20   Temp:  98.8 F (37.1 C) 98.7 F (37.1 C) 98.4 F (36.9 C)  TempSrc:  Oral Oral Oral  SpO2: 100% 100% 100% 100%  Weight: 95 kg (209 lb 7 oz) 95 kg (209 lb 7 oz)    Height:       Weight change:   Physical Exam: General: alert , chronically ill AAM, alert and Ox 3 Heart: RRR , no m,r,g  Lungs: CTA  bilat  Abdomen: Bs pos .,soft , nt,nd  Extremities: 2+ dependent edema, 2+ LUE edema Dialysis Access:  L IJ Perm cath / LUA AVG pos bruit    OP Dialysis: TTS [facility] 4h  88.5kg  2/2.25 bath  P4   LUA AVF   Hep 7000 Venofer 50mg  IV qHD Mircera 146mcg IV q2wks - last 2/14 Calcitriol 3.32mcg PO qHD  Sensipar 30mg  qd Renvela 800mg - 2AC TID, 2 w/snacks  Problem/Plan: 1. SP Fall /SDH  With Crainiotomy  12/05/17 - rehab med eval in process/ Diet advancing 2. Clotted LUA AVF - sp new LUA AVG and L IJ TDC 3/6; per [facility] 3. ESRD - HD TTS. Plan HD today again to get back on sched, max UF 4. HTN/vol - 4 L off yest, still up 7-8 kg 5. Anemia - sp transfusion  hgb 7.5  Aranesp 161mcg  q tues / will incr to 150   6. Secondary hyperparathyroidism - binders now eating  Phos 6.1 /corec Ca 8.8  7. DM type 2- per Viviana Simpler MD [facility] pager [phone]   12/16/2017, 10:49 AM    Labs: Basic Metabolic Panel: Recent Labs  Lab 12/12/17 0931 12/14/17 1939 12/15/17 0603  NA 134* 131* 131*  K 3.6 4.2 4.6  CL 95* 90* 92*  CO2 25 26 23   GLUCOSE 97 87 126*  BUN 45* 84* 92*  CREATININE 4.79* 7.49* 8.01*  CALCIUM 8.3* 7.9* 7.7*  PHOS 5.0* 6.1*  --    Liver Function Tests: Recent Labs  Lab 12/10/17 0517 12/11/17 0352 12/12/17 0931 12/14/17 1939  AST 337* 265*  --   --   ALT 503* 488*  --   --   ALKPHOS 120 140*  --    --   BILITOT 1.8* 1.6*  --   --   PROT 5.4* 6.0*  --   --   ALBUMIN 2.5* 2.8* 2.6* 2.4*   No results for input(s): LIPASE, AMYLASE in the last 168 hours. No results for input(s): AMMONIA in the last 168 hours. CBC: Recent Labs  Lab 12/10/17 0517 12/11/17 0352 12/14/17 1939  WBC 11.2* 8.2 7.8  NEUTROABS 9.4* 6.6  --   HGB 7.4* 7.4* 7.5*  HCT 22.0* 22.4* 22.4*  MCV 90.9 90.3 92.6  PLT 170 154 194   Cardiac Enzymes: No results for input(s): CKTOTAL, CKMB, CKMBINDEX, TROPONINI in the last 168 hours. CBG: Recent Labs  Lab 12/15/17 1141 12/15/17 1637 12/16/17 0002 12/16/17 0405 12/16/17 0723  GLUCAP 95 113* 76 123* 169*    Studies/Results: Dg Chest Port 1 View  Result Date: 12/15/2017 CLINICAL DATA:  Status post dialysis catheter placement today. EXAM: PORTABLE CHEST 1 VIEW COMPARISON:  Single-view of the chest 12/09/2017. FINDINGS: Previously seen left  IJ approach dialysis catheter has been removed and a new catheter is in place. No pneumothorax is identified. Tip of the new catheter projects in the right atrium. There is left basilar airspace disease and a small effusion. Cardiomegaly is noted. No pulmonary edema. Aortic atherosclerosis is identified. Feeding tube is unchanged. IMPRESSION: Tip dialysis of new left catheter IJ approach projects in the right atrium. No pneumothorax. Small left effusion and left basilar airspace disease, likely atelectasis. Cardiomegaly without edema. Atherosclerosis. Electronically Signed   By: Inge Rise M.D.   On: 12/15/2017 13:24   Dg Fluoro Guide Cv Line-no Report  Result Date: 12/15/2017 Fluoroscopy was utilized by the requesting physician.  No radiographic interpretation.   Medications: . sodium chloride Stopped (12/08/17 0939)  . sodium chloride Stopped (12/14/17 1114)  . dextrose Stopped (12/08/17 1554)   . allopurinol  300 mg Oral Daily  . amiodarone  200 mg Oral Daily  . calcitRIOL  0.5 mcg Oral Q T,Th,Sa-HD  . Chlorhexidine  Gluconate Cloth  6 each Topical Q2200  . [START ON 12/23/2017] darbepoetin (ARANESP) injection - DIALYSIS  150 mcg Intravenous Q Thu-HD  . feeding supplement (NEPRO CARB STEADY)  1,000 mL Per Tube Q24H  . feeding supplement (PRO-STAT SUGAR FREE 64)  30 mL Per Tube BID  . heparin  7,000 Units Dialysis Once in dialysis  . insulin aspart  0-9 Units Subcutaneous TID WC  . mouth rinse  15 mL Mouth Rinse BID  . multivitamin  1 tablet Oral QHS  . mupirocin ointment   Nasal BID  . ondansetron (ZOFRAN) IV  4 mg Intravenous Once  . oxybutynin  5 mg Oral BID  . pantoprazole sodium  40 mg Per Tube QHS  . polyethylene glycol  17 g Oral Daily  . sevelamer carbonate  2,400 mg Oral TID WC  . sodium chloride flush  10-40 mL Intracatheter Q12H

## 2017-12-16 NOTE — Progress Notes (Signed)
Retta Diones, RN  Rehab Admission Coordinator  Physical Medicine and Rehabilitation  PMR Pre-admission  Signed  Date of Service:  12/16/2017 5:56 AM       Related encounter: ED to Hosp-Admission (Current) from 12/05/2017 in [facility]      Signed            [] Hide copied text  [] Hover for details   PMR Admission Coordinator Pre-Admission Assessment  Patient: Albert Stanley is an 72 y.o., male MRN: [id number] DOB: 01/24/1946 Height: 5\' 9"  (175.3 cm) Weight: 95 kg (209 lb 7 oz)                                                                                                              Insurance Information HMO:     PPO: X     PCP:      IPA:      80/20:      OTHER:  PRIMARY: UHC Medicare       Policy#: [id number]      Subscriber: Self CM Name: Vevelyn Royals      Phone#:  [phone]     Fax#: [phone] Pre-Cert#: [id number]      Employer: Retired  Benefits:  Phone #: [phone]     Name: [email or web address] Eff. Date: 10/12/17     Deduct: $0      Out of Pocket Max: N/A      Life Max: N/A CIR: $160 a day, days 1-10; $0 a day, days 11+      SNF: $0 a day, days 1-20; $50 a da, days 21-100 Outpatient: Necessity      Co-Pay: $20 Home Health: Necessity, 100%      Co-Pay: $0 DME: 80%     Co-Pay: 20% Providers: In-network   SECONDARY: None        Medicaid Application Date:       Case Manager:  Disability Application Date:       Case Worker:   Emergency Contact Information        Contact Information    Name Relation Home Work [place] Spouse [phone]  [phone]   [place]ain[place], W[place]t Daughter   [phone]     Current Medical History  Patient Admitting Diagnosis: TBI   History of Present Illness: A 72 y.o.right handed malewith 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 Independent with assistive device prior to admission. Independent ADLs and still driving.Wife works during the day. His daughter is in and out during the day.Presented 2/24/2019with fatigue, vomiting and diarrhea 2 days as well as headache. He had missed his last dialysis session. Noted fall 12/03/2017 when he tripped on the carpet. Denied striking his head. Cranial CTreviewed showing right SDH. Per report, large right side subdural hematoma measuring 16 mm in width with significant mass effect and a 13 mm right to left midline shift. Mass effect effaces the sulci  and right lateral ventricle. Uncal herniation present. Neurosurgery consulted underwent right frontotemporal parietal craniotomy hematoma evacuation 12/05/2017 per Dr. Cyndy Freeze. Hospital course hemodialysis ongoingwith conversion of left temporary dialysis catheter to tunneled dialysis catheter after his left BC AVF thrombosed03/03/2018 per Dr. Trula Slade. Intermittent bouts of restlessness maintained on Seroquel. Acute on chronic anemia 7.5and monitored .Dysphagia #1 thin liquid diet. Nasogastric tube remains in place. MRSA PCR screening positive maintained on contact precautions.Physicaland occupationaltherapy evaluationscompleted with recommendations of physical medicine rehabilitation consult.Patient to be admitted for a comprehensive inpatient rehabilitation program.  Past Medical History      Past Medical History:  Diagnosis Date  . Arthritis    Osteoarthritis  . Asthma   . Bladder cancer ([place]) dx'd 1990   surg only  . CHF (congestive heart failure) ([place])   . Diabetes mellitus without complication ([place])   . ESRD (end stage renal disease) ([name])    Tues, Thurs, Sat dialysis  . GERD (gastroesophageal reflux disease)   . History of cardiac catheterization    a. LHC 5/17: no obstructive CAD  . History of DVT (deep vein  thrombosis)    2003 ish  . History of nuclear stress test    a. Myoview 4/17: EF 39%, inf, inf-lat, apical inf, apical lat, apical scar, intermediate risk  . Hyperlipidemia   . Hypertension   . NICM (nonischemic cardiomyopathy) ([place])    a. Echo 2/17: mod LVH, EF 35-40%, inf-lat and inf-sept HK, mod MR, severe LAE, small pericardial effusion    Family History  family history includes Alcohol abuse in his brother; Arthritis in his mother; Cancer in his father; Diabetes in his daughter; Heart attack in his paternal uncle; Heart disease in his father; Hypertension in his sister.  Prior Rehab/Hospitalizations: Had left arm fistula surgery recently.  Has the patient had major surgery during 100 days prior to admission? Yes, left arm fistual surgery  Current Medications   Current Facility-Administered Medications:  .  0.9 %  sodium chloride infusion, 250 mL, Intravenous, PRN, Omar Person, NP, Stopped at 12/08/17 [phone] .  0.9 %  sodium chloride infusion, , Intravenous, Once, Roney Jaffe, MD, Stopped at 12/14/17 1114 .  0.9 %  sodium chloride infusion, 100 mL, Intravenous, PRN, Roney Jaffe, MD .  acetaminophen (TYLENOL) tablet 650 mg, 650 mg, Oral, Q6H PRN, Etheleen Nicks, MD, 650 mg at 12/10/17 0856 .  albuterol (PROVENTIL) (2.5 MG/3ML) 0.083% nebulizer solution 2.5 mg, 2.5 mg, Nebulization, Q4H PRN, Hammonds, Sharyn Blitz, MD .  allopurinol (ZYLOPRIM) tablet 300 mg, 300 mg, Oral, Daily, Ashok Pall, MD, 300 mg at 12/15/17 1359 .  amiodarone (PACERONE) tablet 200 mg, 200 mg, Oral, Daily, Ashok Pall, MD, 200 mg at 12/15/17 1359 .  calcitRIOL (ROCALTROL) capsule 0.5 mcg, 0.5 mcg, Oral, Q T,Th,Sa-HD, Ashok Pall, MD .  Chlorhexidine Gluconate Cloth 2 % PADS 6 each, 6 each, Topical, Q2200, Ashok Pall, MD, 6 each at 12/15/17 2300 .  [START ON 12/23/2017] Darbepoetin Alfa (ARANESP) injection 150 mcg, 150 mcg, Intravenous, Q Thu-HD, Roney Jaffe, MD .  dextrose  10 % infusion, , Intravenous, Continuous, Etheleen Nicks, MD, Stopped at 12/08/17 1554 .  diphenhydrAMINE (BENADRYL) capsule 25 mg, 25 mg, Oral, Q6H PRN, Ashok Pall, MD, 25 mg at 12/16/17 1020 .  feeding supplement (NEPRO CARB STEADY) liquid 1,000 mL, 1,000 mL, Per Tube, Q24H, Etheleen Nicks, MD, Stopped at 12/14/17 1341 .  feeding supplement (PRO-STAT SUGAR FREE 64) liquid 30 mL, 30 mL, Per Tube, BID, Rollene Fare  M, MD, 30 mL at 12/14/17 2159 .  haloperidol (HALDOL) tablet 5 mg, 5 mg, Oral, Q8H PRN, 5 mg at 12/11/17 2332 **OR** haloperidol lactate (HALDOL) injection 5 mg, 5 mg, Intravenous, Q6H PRN, Etheleen Nicks, MD .  heparin injection 1,000 Units, 1,000 Units, Dialysis, PRN, Roney Jaffe, MD, 1,000 Units at 12/15/17 2128 .  heparin injection 3,500 Units, 3,500 Units, Dialysis, PRN, Roney Jaffe, MD .  heparin injection 7,000 Units, 7,000 Units, Dialysis, Once in dialysis, Roney Jaffe, MD .  insulin aspart (novoLOG) injection 0-9 Units, 0-9 Units, Subcutaneous, TID WC, Velvet Bathe, MD, 2 Units at 12/16/17 0801 .  lidocaine-prilocaine (EMLA) cream 1 application, 1 application, Topical, PRN, Roney Jaffe, MD .  MEDLINE mouth rinse, 15 mL, Mouth Rinse, BID, Etheleen Nicks, MD, 15 mL at 12/14/17 2156 .  multivitamin (RENA-VIT) tablet 1 tablet, 1 tablet, Oral, QHS, Ashok Pall, MD, 1 tablet at 12/14/17 2148 .  mupirocin ointment (BACTROBAN) 2 %, , Nasal, BID, Etheleen Nicks, MD .  MUSCLE RUB CREA, , Topical, PRN, Etheleen Nicks, MD .  ondansetron [facility]) injection 4 mg, 4 mg, Intravenous, Once, Tyrone Nine, Dan, DO .  oxybutynin (DITROPAN) 5 MG/5ML syrup 5 mg, 5 mg, Oral, BID, Etheleen Nicks, MD, 5 mg at 12/14/17 2150 .  oxyCODONE-acetaminophen (PERCOCET) 7.5-325 MG per tablet 1 tablet, 1 tablet, Oral, Q6H PRN, Etheleen Nicks, MD, 1 tablet at 12/16/17 1019 .  pantoprazole sodium (PROTONIX) 40 mg/20 mL oral suspension 40 mg, 40 mg, Per Tube, QHS, Ashok Pall, MD, 40 mg at  12/14/17 2150 .  pentafluoroprop-tetrafluoroeth (GEBAUERS) aerosol 1 application, 1 application, Topical, PRN, Roney Jaffe, MD .  polyethylene glycol (MIRALAX / GLYCOLAX) packet 17 g, 17 g, Oral, Daily, Etheleen Nicks, MD, 17 g at 12/15/17 1513 .  sevelamer carbonate (RENVELA) tablet 2,400 mg, 2,400 mg, Oral, TID WC, Ashok Pall, MD, 2,400 mg at 12/16/17 1017 .  sodium chloride flush (NS) 0.9 % injection 10-40 mL, 10-40 mL, Intracatheter, Q12H, Ashok Pall, MD, 10 mL at 12/15/17 2300 .  sodium chloride flush (NS) 0.9 % injection 10-40 mL, 10-40 mL, Intracatheter, PRN, Ashok Pall, MD  Patients Current Diet: DIET - DYS 1 Room service appropriate? Yes; Fluid consistency: Thin  Precautions / Restrictions Precautions Precautions: Fall Restrictions Weight Bearing Restrictions: No   Has the patient had 2 or more falls or a fall with injury in the past year?Yes  Prior Activity Level Limited Community (1-2x/wk): Prior to admission patient was independnet at home wiith use of assistive devices.  He got out into the community for HD and medical visits.   Home Assistive Devices / Equipment Home Assistive Devices/Equipment: None Home Equipment: Walker - 2 wheels, Crutches  Prior Device Use: Indicate devices/aids used by the patient prior to current illness, exacerbation or injury? Walker and crutch and stair lift   Prior Functional Level Prior Function Level of Independence: Independent with assistive device(s) Comments: Family reports pt does not drive.     Self Care: Did the patient need help bathing, dressing, using the toilet or eating?Independent  Indoor Mobility: Did the patient need assistance with walking from room to room (with or without device)? Independent  Stairs: Did the patient need assistance with internal or external stairs (with or without device)? Needed some help  Functional Cognition: Did the patient need help planning regular tasks such as shopping  or remembering to take medications? Needed some help  Current Functional Level Cognition  Arousal/Alertness: Awake/alert Overall Cognitive Status:  Impaired/Different from baseline Current Attention Level: Sustained, Selective Orientation Level: Oriented to person, Oriented to place, Oriented to situation, Disoriented to time Following Commands: Follows one step commands consistently Safety/Judgement: Decreased awareness of safety, Decreased awareness of deficits General Comments: Pt perseverates on his medications and need to have those brought to hospital.  He frequently self distracts  Attention: Focused, Sustained Focused Attention: Appears intact Sustained Attention: Impaired Sustained Attention Impairment: Verbal basic, Functional basic Awareness: Impaired Awareness Impairment: Intellectual impairment Problem Solving: Impaired Problem Solving Impairment: Verbal basic, Functional basic Executive Function: (all impaired by lower level deficits) Safety/Judgment: Impaired    Extremity Assessment (includes Sensation/Coordination)  Upper Extremity Assessment: RUE deficits/detail RUE Deficits / Details: Grossly 4/5  RUE Sensation: (pt reports intact light touch to palpation) LUE Deficits / Details: Pt demonstrates strength grossly 3+/5.   He is slow to initiate movement with Lt UE  LUE Coordination: decreased fine motor, decreased gross motor  Lower Extremity Assessment: Defer to PT evaluation RLE Deficits / Details: weak, but functional at 4/5, decreased focus on MMT LLE Deficits / Details: moves grossly with assist, uncoordinated, pt inattentive to L LE.  little isolation.    ADLs  Overall ADL's : Needs assistance/impaired Eating/Feeding: Moderate assistance, Bed level, Sitting Grooming: Wash/dry hands, Wash/dry face, Minimal assistance, Sitting Upper Body Bathing: Moderate assistance, Sitting Lower Body Bathing: Maximal assistance, Bed level Upper Body Dressing : Moderate  assistance, Sitting Lower Body Dressing: Total assistance, Bed level Toilet Transfer: Total assistance Toileting- Clothing Manipulation and Hygiene: Bed level, Total assistance Functional mobility during ADLs: Maximal assistance, +2 for physical assistance, +2 for safety/equipment    Mobility  Overal bed mobility: Needs Assistance Bed Mobility: Supine to Sit, Sit to Supine Rolling: Mod assist Sidelying to sit: Max assist Supine to sit: Mod assist Sit to supine: Mod assist General bed mobility comments: Pt requires assist to move LEs on and off bed and min A to lift trunk     Transfers  Overall transfer level: Needs assistance Transfer via Lift Equipment: Stedy Transfers: Sit to/from Stand Sit to Stand: +2 physical assistance, +2 safety/equipment, From elevated surface, Total assist  Lateral/Scoot Transfers: Max assist, From elevated surface General transfer comment: transfer from bed to recliner chair. multimodal cues for sequencing and attention to task. mod to max assist +2 for anterior translation and boost up from bed    Ambulation / Gait / Stairs / Wheelchair Mobility  Ambulation/Gait General Gait Details: unable at this time    Posture / Balance Dynamic Sitting Balance Sitting balance - Comments: Pt able to maintain EOB sitting x 15 mins with min guard assist  Balance Overall balance assessment: Needs assistance Sitting-balance support: Feet supported Sitting balance-Leahy Scale: Fair Sitting balance - Comments: Pt able to maintain EOB sitting x 15 mins with min guard assist  Standing balance-Leahy Scale: Zero    Special needs/care consideration BiPAP/CPAP: No CPM: No Continuous Drip IV: No Dialysis: Yes        Days: T,Th,Sat Life Vest: No Oxygen: None PTA Special Bed: High low bed with floor mats Trach Size: No Wound Vac (area): No       Skin: Dry, Ecchymosis to bilateral arms and hips; Skin tear left ear; History of lymphoedema to bilateral lower  extremities that were being wrapped PTA, now resolved per wife Bowel mgmt: Incontinent, last BM 3/1 Bladder mgmt: Long-term indwelling foley PTA Diabetic mgmt: Yes, with CBG checks and oral medication     Previous Home Environment Living Arrangements: Spouse/significant other  Lives With: Spouse Available Help at Discharge: Family, Available PRN/intermittently Type of Home: House Home Layout: Two level Alternate Level Stairs-Rails: (chair lift ) Alternate Level Stairs-Number of Steps: flight Home Access: Stairs to enter Entrance Stairs-Rails: Right, Left Entrance Stairs-Number of Steps: 5 Bathroom Shower/Tub: Multimedia programmer: Handicapped height Home Care Services: No  Discharge Living Setting Plans for Discharge Living Setting: Patient's home, Lives with (comment)(Spouse) Type of Home at Discharge: House Discharge Home Layout: Multi-level, Bed/bath upstairs Alternate Level Stairs-Rails: (has a stair lift to go upstairs) Discharge Home Access: Other (comment)(will have a ramped entrance prior to returning home per spou) Discharge Bathroom Shower/Tub: Walk-in shower Discharge Bathroom Toilet: Handicapped height Discharge Bathroom Accessibility: (spouse unsure if accessible with walker used a crutch PTA) Does the patient have any problems obtaining your medications?: No  Social/Family/Support Systems Patient Roles: Spouse, Parent Contact Information: Spouse: Kathrynn Ducking Anticipated Caregiver: Spouse  Anticipated Ambulance person Information: cell:[phone] Ability/Limitations of Caregiver: Spouse has worked at [employer] for 6 months  Caregiver Availability: (Aware of 24/7 recommendations and willing to hire assist ) Discharge Plan Discussed with Primary Caregiver: Yes Is Caregiver In Agreement with Plan?: Yes Does Caregiver/Family have Issues with Lodging/Transportation while Pt is in Rehab?: No  Goals/Additional Needs Patient/Family Goal for Rehab:  PT/OT/SLP: Min A Expected length of stay: 16-19 days  Cultural Considerations: [employer] Dietary Needs: Dys.1 textures and thin liquids  Equipment Needs: TBD Additional Information: T,Th,Sat Pt/Family Agrees to Admission and willing to participate: Yes Program Orientation Provided & Reviewed with Pt/Caregiver Including Roles  & Responsibilities: Yes Additional Information Needs: Hired care giver resources Information Needs to be Provided By: CSW  Barriers to Discharge: Decreased caregiver support, Home environment access/layout, Other (comments)(Discussed potential benefits of a bowel program given layout)  Decrease burden of Care through IP rehab admission: No  Possible need for SNF placement upon discharge: Not anticipated at this time  Patient Condition: This patient's medical and functional status has changed since the consult dated: 12/13/17 in which the Rehabilitation Physician determined and documented that the patient's condition is appropriate for intensive rehabilitative care in an inpatient rehabilitation facility. See "History of Present Illness" (above) for medical update. Functional changes are:  Currently requiring total assist +2 for transfers.  Patient's medical and functional status update has been discussed with the Rehabilitation physician and patient remains appropriate for inpatient rehabilitation. Will admit to inpatient rehab today.  Preadmission Screen Completed By:  Retta Diones, 12/16/2017 12:35 PM ______________________________________________________________________   Discussed status with Dr. Naaman Plummer on 12/16/17 at 1235 and received telephone approval for admission today.  Admission Coordinator:  Retta Diones, time 1235/Date 12/16/17             Cosigned by: [name], MD at 12/16/2017 1:14 PM  Revision History

## 2017-12-16 NOTE — Progress Notes (Signed)
SLP Cancellation Note  Patient Details Name: KALIEF KATTNER MRN: [id number] DOB: [birth year]   Cancelled treatment:       Reason Eval/Treat Not Completed: Patient at procedure or test/unavailable.  Pt off the floor at this time.  Per report, will be discharging to CIR today.  Recommend ST follow up at next venue of care.    Markeisha Mancias, Selinda Orion 12/16/2017, 2:42 PM

## 2017-12-16 NOTE — Progress Notes (Signed)
[facility]  Patient evaluated this AM.  Noted swelling in left arm with mild blistering which was evaluated by vascular surgery. He is otherwise medically stable. Noted that he is being discharged today per primary team to CIR.   Debbe Odea, MD

## 2017-12-16 NOTE — Progress Notes (Signed)
Patient and family were informed about rehab process including patient safety plan , and rehab booklet.

## 2017-12-16 NOTE — Progress Notes (Signed)
Albert Arn, MD  Physician  Physical Medicine and Rehabilitation  Consult Note  Addendum  Date of Service:  12/13/2017 6:04 AM       Related encounter: ED to Hosp-Admission (Current) from 12/05/2017 in [place] 3W [facility]      Expand All Collapse All       [] Hide copied text  [] Hover for details        Physical Medicine and Rehabilitation Consult Reason for Consult: Decreased functional mobility Referring Physician: Dr. Cyndy Freeze   HPI: Albert Stanley is a 72 y.o. right handed male with history of lymphedema, end-stage renal disease with hemodialysis, diastolic congestive heart failure, diabetes mellitus, hypertension, nonischemic cardiomyopathy. History taken from chart review. Patient lives with spouse and daughter. Independent with assistive device prior to admission. Independent ADLs and still driving. Wife works during the day. His daughter is in and out during the day. Presented 12/05/2017 with fatigue, vomiting and diarrhea 2 days as well as headache. He had missed his last dialysis session. Noted fall 12/03/2017 when he tripped on the carpet. Denied striking his head. Cranial CT reviewed showing right SDH.  Per report, large right side subdural hematoma measuring 16 mm in width with significant mass effect and a 13 mm right to left midline shift. Mass effect effaces the sulci and right lateral ventricle. Uncal herniation present. Neurosurgery consulted underwent right frontotemporal parietal craniotomy hematoma evacuation 12/05/2017 per Dr. Cyndy Freeze. Hospital course hemodialysis ongoing. Intermittent bouts of restlessness maintained on Seroquel. Acute on chronic anemia 7.4. Dysphagia #1 thin liquid diet. Nasogastric tube remains in place. MRSA PCR screening positive maintained on contact precautions. Physical therapy evaluation completed with recommendations of physical medicine rehabilitation consult   Review of Systems  Constitutional: Positive for  malaise/fatigue. Negative for chills and fever.  HENT: Negative for hearing loss.   Eyes: Negative for blurred vision and double vision.  Respiratory: Positive for shortness of breath. Negative for cough.   Cardiovascular: Positive for leg swelling. Negative for chest pain and palpitations.  Gastrointestinal: Positive for diarrhea and nausea.       GERD  Genitourinary: Negative for flank pain and hematuria.  Musculoskeletal: Positive for falls and myalgias.  Skin: Negative for rash.  Neurological: Positive for headaches.  All other systems reviewed and are negative.      Past Medical History:  Diagnosis Date  . Arthritis    Osteoarthritis  . Asthma   . Bladder cancer ([place]) dx'd 1990   surg only  . CHF (congestive heart failure) ([place])   . Diabetes mellitus without complication ([place])   . ESRD (end stage renal disease) ([name])    Tues, Thurs, Sat dialysis  . GERD (gastroesophageal reflux disease)   . History of cardiac catheterization    a. LHC 5/17: no obstructive CAD  . History of DVT (deep vein thrombosis)    2003 ish  . History of nuclear stress test    a. Myoview 4/17: EF 39%, inf, inf-lat, apical inf, apical lat, apical scar, intermediate risk  . Hyperlipidemia   . Hypertension   . NICM (nonischemic cardiomyopathy) ([place])    a. Echo 2/17: mod LVH, EF 35-40%, inf-lat and inf-sept HK, mod MR, severe LAE, small pericardial effusion   Past Surgical History:  Procedure Laterality Date  . arthroscopic knee surgery Left   . AV FISTULA PLACEMENT Left 12/13/2015   Procedure: ARTERIOVENOUS (AV) FISTULA CREATION;  Surgeon: Angelia Mould, MD;  Location: [facility];  Service: Vascular;  Laterality: Left;  .  BACK SURGERY     2 times  1960  . CARDIAC CATHETERIZATION N/A 02/28/2016   Procedure: Left Heart Cath and Coronary Angiography;  Surgeon: Larey Dresser, MD;  Location: [facility];  Service: Cardiovascular;  Laterality: N/A;  . CRANIOTOMY N/A  12/05/2017   Procedure: CRANIOTOMY HEMATOMA EVACUATION SUBDURAL;  Surgeon: Ashok Pall, MD;  Location: [facility];  Service: Neurosurgery;  Laterality: N/A;  . CYSTOSCOPY    . FISTULA SUPERFICIALIZATION Left 08/14/2016   Procedure: FISTULA SUPERFICIALIZATION LEFT UPPER ARM;  Surgeon: Angelia Mould, MD;  Location: [facility];  Service: Vascular;  Laterality: Left;  . INSERTION OF DIALYSIS CATHETER N/A 12/13/2015   Procedure: INSERTION OF DIALYSIS CATHETER;  Surgeon: Angelia Mould, MD;  Location: [facility];  Service: Vascular;  Laterality: N/A;  . INSERTION OF DIALYSIS CATHETER Left 05/07/2017   Procedure: INSERTION OF DIALYSIS CATHETER LEFT INTERNAL JUGULAR PLACEMENT;  Surgeon: Angelia Mould, MD;  Location: [facility];  Service: Vascular;  Laterality: Left;  . IR RADIOLOGIST EVAL & MGMT  05/12/2017  . JOINT REPLACEMENT Left    hip  . LIGATION OF COMPETING BRANCHES OF ARTERIOVENOUS FISTULA Left 08/14/2016   Procedure: LIGATION OF COMPETING BRANCHES OF ARTERIOVENOUS FISTULA LEFT UPPER ARM;  Surgeon: Angelia Mould, MD;  Location: [facility];  Service: Vascular;  Laterality: Left;  . PERIPHERAL VASCULAR CATHETERIZATION Left 06/09/2016   Procedure: Fistulagram;  Surgeon: Serafina Mitchell, MD;  Location: [facility];  Service: Cardiovascular;  Laterality: Left;  ARM  . PERIPHERAL VASCULAR CATHETERIZATION Left 06/09/2016   Procedure: Peripheral Vascular Balloon Angioplasty;  Surgeon: Serafina Mitchell, MD;  Location: [facility];  Service: Cardiovascular;  Laterality: Left;  upper arm venous  . PERIPHERAL VASCULAR CATHETERIZATION Left 06/25/2016   Procedure: Fistulagram;  Surgeon: Serafina Mitchell, MD;  Location: [facility];  Service: Cardiovascular;  Laterality: Left;  . REVISON OF ARTERIOVENOUS FISTULA Left 05/07/2017   Procedure: REPAIR OF PSEUDOANEURYSM  LEFT BRACHIO-CEPHALIC  ARM ARTERIOVENOUS FISTULA;  Surgeon: Angelia Mould, MD;  Location: [facility];  Service:  Vascular;  Laterality: Left;        Family History  Problem Relation Age of Onset  . Arthritis Mother   . Cancer Father   . Heart disease Father   . Hypertension Sister   . Alcohol abuse Brother   . Diabetes Daughter   . Heart attack Paternal Uncle    Social History:  reports that he quit smoking about 19 years ago. He quit after 30.00 years of use. he has never used smokeless tobacco. He reports that he does not drink alcohol or use drugs. Allergies:       Allergies  Allergen Reactions  . Meperidine Shortness Of Breath and Other (See Comments)    Increased heart rate Reaction to demerol  . Sulfa Antibiotics Shortness Of Breath and Other (See Comments)    Increased heart rate  . Sulfonamide Derivatives Shortness Of Breath and Other (See Comments)    Increased heart rate  . Eggs Or Egg-Derived Products Nausea And Vomiting     Reaction to egg yolks - not whites    . Foltx [Elfolate Plus] Other (See Comments)    Pt does not recall reaction to this  . [place] Diarrhea, Nausea And Vomiting and Other (See Comments)    Constipation Reaction to whole milk  . Lactose Intolerance (Gi) Diarrhea, Nausea And Vomiting and Other (See Comments)    Constipation Reaction to whole  milk  . Milk-Related Compounds Diarrhea, Nausea And Vomiting and Other (See Comments)    Constipation Reaction to whole milk  . Penicillins Other (See Comments)    "Knocks me out."   Has patient had a PCN reaction causing immediate rash, facial/tongue/throat swelling, SOB or lightheadedness with hypotension: yes- blacked out Has patient had a PCN reaction causing severe rash involving mucus membranes or skin necrosis: unknown Has patient had a PCN reaction that required hospitalization: at MD office Has patient had a PCN reaction occurring within the last 10 years: no If all of the above answers are "NO", then may proceed with Cephalosporin use.   . Betadine [Povidone Iodine]  Itching, Rash and Other (See Comments)    burning         Medications Prior to Admission  Medication Sig Dispense Refill  . acetaminophen (TYLENOL) 500 MG tablet Take 1,000 mg by mouth every 6 (six) hours as needed for mild pain.    Marland Kitchen albuterol (PROVENTIL HFA;VENTOLIN HFA) 108 (90 Base) MCG/ACT inhaler Inhale 1-2 puffs into the lungs every 6 (six) hours as needed for wheezing or shortness of breath. 1 Inhaler 0  . allopurinol (ZYLOPRIM) 300 MG tablet Take 300 mg by mouth daily.     Marland Kitchen amiodarone (PACERONE) 200 MG tablet Take 1 tablet (200 mg total) by mouth daily. 30 tablet 3  . calcitRIOL (ROCALTROL) 0.5 MCG capsule Take 0.5 mcg by mouth Every Tuesday,Thursday,and Saturday with dialysis. Medication given at dialysis on Tues Thurs Sat  1  . diphenhydrAMINE (BENADRYL) 25 MG tablet Take 25 mg by mouth 2 (two) times daily as needed for itching or allergies.     Marland Kitchen glipiZIDE (GLUCOTROL) 5 MG tablet Take 0.5 tablets (2.5 mg total) by mouth 2 (two) times daily. (Patient taking differently: Take 2.5 mg by mouth See admin instructions. Take as needed...one tablet in the morning if BS is greater than 80.)    . Menthol, Topical Analgesic, (ICY HOT EX) Apply 1 application topically 3 (three) times daily as needed (pain).    . multivitamin (RENA-VIT) TABS tablet Take 1 tablet by mouth at bedtime.   0  . oxyCODONE-acetaminophen (PERCOCET) 7.5-325 MG tablet Take 1 tablet by mouth 3 (three) times daily as needed (pain). (Patient taking differently: Take 1 tablet by mouth 4 (four) times daily as needed (pain). ) 20 tablet 0  . sevelamer carbonate (RENVELA) 800 MG tablet Take 2 tablets (1,600 mg total) by mouth 3 (three) times daily with meals. (Patient taking differently: Take 2,400 mg by mouth See admin instructions. Take 3 tablets (2400 mg) by mouth daily with meals - usually twice daily) 90 tablet 0  . carvedilol (COREG) 3.125 MG tablet Take 1 tablet (3.125 mg total) by mouth See admin instructions.  Take 1 tablet (3.125 mg) by mouth twice daily on Monday, Wednesday, Friday and Sunday (non-dialysis days) (Patient not taking: Reported on 12/04/2017)    . famotidine (PEPCID) 20 MG tablet Take 1 tablet (20 mg total) by mouth 2 (two) times daily as needed (sour stomach). (Patient not taking: Reported on 12/04/2017)      Home: Home Living Family/patient expects to be discharged to:: Private residence Living Arrangements: Spouse/significant other Available Help at Discharge: Family, Available PRN/intermittently(per husband wife works, ? if family could provide 52* care.) Type of Home: House Home Access: Stairs to enter [employer] of Steps: 5 Entrance Stairs-Rails: Right, Left Home Layout: Two level Alternate Level Stairs-Number of Steps: flight Alternate Level Stairs-Rails: (chair lift ) Bathroom  Shower/Tub: Multimedia programmer: Handicapped height Home Equipment: Environmental consultant - 2 wheels, Crutches  Functional History: Prior Function Level of Independence: Independent with assistive device(s) Comments: pt reports independent in ADL;s and drove Functional Status:  Mobility: Bed Mobility Overal bed mobility: Needs Assistance Bed Mobility: Rolling, Sidelying to Sit, Sit to Supine Rolling: Mod assist Sidelying to sit: Max assist Sit to supine: Max assist General bed mobility comments: cues for sequence, step by step direction due to inattention Transfers Overall transfer level: Needs assistance Transfers: Sit to/from Stand, Lateral/Scoot Transfers Sit to Stand: Max assist, From elevated surface  Lateral/Scoot Transfers: Max assist, From elevated surface General transfer comment: cues for technique, and focus on task, assist to come forward, support L side and boost.  ADL:  Cognition: Cognition Overall Cognitive Status: Impaired/Different from baseline Orientation Level: Oriented to person, Oriented to place, Oriented to situation, Disoriented to  time Cognition Arousal/Alertness: Awake/alert Behavior During Therapy: Restless, Impulsive Overall Cognitive Status: Impaired/Different from baseline  Blood pressure 112/61, pulse 76, temperature 98.1 F (36.7 C), temperature source Oral, resp. rate 20, height 5\' 9"  (1.753 m), weight 100 kg (220 lb 7.4 oz), SpO2 97 %. Physical Exam  Vitals reviewed. Constitutional: He is oriented to person, place, and time. He appears well-developed and well-nourished.  HENT:  Nasogastric tube in place.  Cranio site clean and dry Right facial edema  Eyes: EOM are normal. Right eye exhibits no discharge. Left eye exhibits no discharge.  Right lid edema  Neck: Normal range of motion. Neck supple. No thyromegaly present.  Cardiovascular: Normal rate, regular rhythm and normal heart sounds.  Respiratory: Effort normal and breath sounds normal. No respiratory distress.  GI: Soft. Bowel sounds are normal. He exhibits no distension.  Musculoskeletal:  LE edema  Neurological: He is alert and oriented to person, place, and time.  Follow simple commands. Motor: RUE: 5/5 proximal to distal LUE: 4-4+/5 proximal to distal RLE: 4+/5 proximal to distal LLE: HF 2/5, KE 2+/5, ADF 4-/5  Skin: Skin is warm and dry.  Psychiatric: His affect is blunt. Cognition and memory are impaired. He expresses impulsivity.         Plan: Diagnosis: TBI             [place] score:  IV             Speech to evaluate for Post traumatic amnesia and interval GOAT scores to assess progress.             NeuroPsych evaluation for behavorial assessment.             Provide environmental management by reducing the level of stimulation, tolerating restlessness when possible, protecting patient from harming self or others and reducing patient's cognitive confusion.             Address behavioral concerns include providing structured environments and daily routines.             Cognitive therapy to direct modular abilities  in order to maintain goals        including problem solving, self regulation/monitoring, self management, attention, and memory.             Fall precautions; pt at risk for second impact syndrome             Prevention of secondary injury: monitor for hypotension, hypoxia, seizures or signs of increased ICP             Prophylactic AED:  Consider pharmacological intervention if necessary with neurostimulants, such as amantadine, methylphenidate, modafinil, etc.             Consider Propranolol for agitation and storming             Avoid medications that could impair cognitive abilities, such as anticholinergics, antihistaminic, benzodiazapines, narcotics, etc when possible  1. Does the need for close, 24 hr/day medical supervision in concert with the patient's rehab needs make it unreasonable for this patient to be served in a less intensive setting? Yes  2. Co-Morbidities requiring supervision/potential complications: Dysphagia (advance diet as tolerated), Acute on chronic anemia (transfuse if necessary to ensure appropriate perfusion for increased activity tolerance), lymphedema, end-stage renal disease with hemodialysis (recs per Nephro), diastolic congestive heart failure (monitor for signs/symptoms of fluid overload), diabetes mellitus (Monitor in accordance with exercise and adjust meds as necessary), HTN (monitor and provide prns in accordance with increased physical exertion and pain), nonischemic cardiomyopathy  3. Due to bladder management, bowel management, safety, skin/wound care, disease management, medication administration, pain management and patient education, does the patient require 24 hr/day rehab nursing? Yes 4. Does the patient require coordinated care of a physician, rehab nurse, PT (1-2 hrs/day, 5 days/week), OT (1-2 hrs/day, 5 days/week) and SLP (1-2 hrs/day, 5 days/week) to address physical and functional deficits in the context of the above medical diagnosis(es)?  Yes Addressing deficits in the following areas: balance, endurance, locomotion, strength, transferring, bowel/bladder control, bathing, dressing, feeding, grooming, toileting, cognition, speech, swallowing and psychosocial support 5. Can the patient actively participate in an intensive therapy program of at least 3 hrs of therapy per day at least 5 days per week? Potentially 6. The potential for patient to make measurable gains while on inpatient rehab is excellent 7. Anticipated functional outcomes upon discharge from inpatient rehab are min assist  with PT, min assist with OT, min assist with SLP. 8. Estimated rehab length of stay to reach the above functional goals is: 16-19 days. 9. Anticipated D/C setting: Home 10. Anticipated post D/C treatments: HH therapy and Home excercise program 11. Overall Rehab/Functional Prognosis: good  RECOMMENDATIONS: This patient's condition is appropriate for continued rehabilitative care in the following setting: Will need to inquire about caregiver support at discharge as pt will require assistance.  Patient will also need to be willing to stay in the hospital as he is currently stating that he wants to go home and prefers [facility] therapies. CIR after completion of medical workup.  If plan is for home d/c, recommend PM&R outpt follow up. Patient has agreed to participate in recommended program. Potentially Note that insurance prior authorization may be required for reimbursement for recommended care.  Comment: Rehab Admissions Coordinator to follow up.  Delice Lesch, MD, ABPMR Lavon Paganini Angiulli, PA-C 12/13/2017      Revision History                             Routing History

## 2017-12-16 NOTE — Telephone Encounter (Signed)
Sched appt 12/27/17 at 4:00. Spoke to pt's daughter to inform them of appt/ pt's daughter thinks the pt will still be in the hosp on the 18th but wants to keep this appt for now.

## 2017-12-16 NOTE — Progress Notes (Signed)
PT Cancellation Note  Patient Details Name: Albert Stanley MRN: [id number] DOB: 1945-12-15   Cancelled Treatment:    Reason Eval/Treat Not Completed: Patient at procedure or test/unavailable per MD, pt preparing to go to HD. Will follow up as time allows.   [place] 12/16/2017, 10:21 AM Wray Kearns, [place], DPT [phone]

## 2017-12-16 NOTE — Progress Notes (Signed)
Report called to inpatient rehab. Report given to elaina. Patient and family notified.

## 2017-12-16 NOTE — H&P (Signed)
Physical Medicine and Rehabilitation Admission H&P  Chief complaint: Weakness  HPI: Albert Stanley is a 72 y.o. right handed male with history of bladder cancer with resection in 1999 lymphedema, end-stage renal disease with hemodialysis, diastolic congestive heart failure, diabetes mellitus, hypertension, nonischemic cardiomyopathy. History taken from chart review. Patient lives with spouse and daughter. Independent with assistive device prior to admission. Independent ADLs and still driving. Wife works during the day. His daughter is in and out during the day. Presented 12/05/2017 with fatigue, vomiting and diarrhea 2 days as well as headache. He had missed his last dialysis session. Noted fall 12/03/2017 when he tripped on the carpet. Denied striking his head. Cranial CT reviewed showing right SDH. Per report, large right side subdural hematoma measuring 16 mm in width with significant mass effect and a 13 mm right to left midline shift. Mass effect effaces the sulci and right lateral ventricle. Uncal herniation present. Neurosurgery consulted underwent right frontotemporal parietal craniotomy hematoma evacuation 12/05/2017 per Dr. Cyndy Freeze. Hospital course hemodialysis ongoing with conversion of left temporary dialysis catheter to tunneled dialysis catheter after his left BC AVF thrombosed 12/15/2017 per Dr. Trula Slade. Intermittent bouts of restlessness maintained on Seroquel. Acute on chronic anemia 7.5 and monitored . Dysphagia #1 thin liquid diet. Nasogastric tube remains in place. MRSA PCR screening positive maintained on contact precautions. Physical and occupational therapy evaluations completed with recommendations of physical medicine rehabilitation consult. Patient was admitted for a comprehensive rehabilitation program  Review of Systems  Constitutional: Positive for malaise/fatigue. Negative for fever.  HENT: Negative for hearing loss.  Eyes: Negative for blurred vision and double vision.    Respiratory: Positive for shortness of breath.  Cardiovascular: Positive for leg swelling.  Gastrointestinal: Positive for diarrhea and nausea.  GERD  Genitourinary: Negative for flank pain and hematuria.  Musculoskeletal: Positive for falls and myalgias.  Skin: Negative for rash.  Neurological: Positive for headaches. Negative for seizures.  All other systems reviewed and are negative.       Past Medical History:  Diagnosis Date  . Arthritis    Osteoarthritis  . Asthma   . Bladder cancer ([facility]) dx'd 1990   surg only  . CHF (congestive heart failure) ([facility])   . Diabetes mellitus without complication ([place])   . ESRD (end stage renal disease) ([name])    Tues, Thurs, Sat dialysis  . GERD (gastroesophageal reflux disease)   . History of cardiac catheterization    a. LHC 5/17: no obstructive CAD  . History of DVT (deep vein thrombosis)    2003 ish  . History of nuclear stress test    a. Myoview 4/17: EF 39%, inf, inf-lat, apical inf, apical lat, apical scar, intermediate risk  . Hyperlipidemia   . Hypertension   . NICM (nonischemic cardiomyopathy) ([facility])    a. Echo 2/17: mod LVH, EF 35-40%, inf-lat and inf-sept HK, mod MR, severe LAE, small pericardial effusion        Past Surgical History:  Procedure Laterality Date  . arthroscopic knee surgery Left   . AV FISTULA PLACEMENT Left 12/13/2015   Procedure: ARTERIOVENOUS (AV) FISTULA CREATION; Surgeon: Angelia Mould, MD; Location: [place]; Service: Vascular; Laterality: Left;  . BACK SURGERY     2 times 1960  . CARDIAC CATHETERIZATION N/A 02/28/2016   Procedure: Left Heart Cath and Coronary Angiography; Surgeon: Larey Dresser, MD; Location: [facility]; Service: Cardiovascular; Laterality: N/A;  . CRANIOTOMY N/A 12/05/2017   Procedure: CRANIOTOMY HEMATOMA EVACUATION SUBDURAL; Surgeon: Ashok Pall,  MD; Location: [facility]; Service: Neurosurgery; Laterality: N/A;  . CYSTOSCOPY    . FISTULA SUPERFICIALIZATION Left 08/14/2016    Procedure: FISTULA SUPERFICIALIZATION LEFT UPPER ARM; Surgeon: Angelia Mould, MD; Location: [employer]; Service: Vascular; Laterality: Left;  . INSERTION OF DIALYSIS CATHETER N/A 12/13/2015   Procedure: INSERTION OF DIALYSIS CATHETER; Surgeon: Angelia Mould, MD; Location: [facility]; Service: Vascular; Laterality: N/A;  . INSERTION OF DIALYSIS CATHETER Left 05/07/2017   Procedure: INSERTION OF DIALYSIS CATHETER LEFT INTERNAL JUGULAR PLACEMENT; Surgeon: Angelia Mould, MD; Location: [facility]; Service: Vascular; Laterality: Left;  . IR RADIOLOGIST EVAL & MGMT  05/12/2017  . JOINT REPLACEMENT Left    hip  . LIGATION OF COMPETING BRANCHES OF ARTERIOVENOUS FISTULA Left 08/14/2016   Procedure: LIGATION OF COMPETING BRANCHES OF ARTERIOVENOUS FISTULA LEFT UPPER ARM; Surgeon: Angelia Mould, MD; Location: [place] [facility]; Service: Vascular; Laterality: Left;  . PERIPHERAL VASCULAR CATHETERIZATION Left 06/09/2016   Procedure: Fistulagram; Surgeon: Serafina Mitchell, MD; Location: [facility]; Service: Cardiovascular; Laterality: Left; ARM  . PERIPHERAL VASCULAR CATHETERIZATION Left 06/09/2016   Procedure: Peripheral Vascular Balloon Angioplasty; Surgeon: Serafina Mitchell, MD; Location: [facility]; Service: Cardiovascular; Laterality: Left; upper arm venous  . PERIPHERAL VASCULAR CATHETERIZATION Left 06/25/2016   Procedure: Fistulagram; Surgeon: Serafina Mitchell, MD; Location: [facility]; Service: Cardiovascular; Laterality: Left;  . REVISON OF ARTERIOVENOUS FISTULA Left 05/07/2017   Procedure: REPAIR OF PSEUDOANEURYSM LEFT BRACHIO-CEPHALIC ARM ARTERIOVENOUS FISTULA; Surgeon: Angelia Mould, MD; Location[facility]; Service: Vascular; Laterality: Left;        Family History  Problem Relation Age of Onset  . Arthritis Mother   . Cancer Father   . Heart disease Father   . Hypertension Sister   . Alcohol abuse Brother   . Diabetes Daughter   . Heart attack Paternal Uncle     Social History: reports that he quit smoking about 19 years ago. He quit after 30.00 years of use. he has never used smokeless tobacco. He reports that he does not drink alcohol or use drugs.  Allergies:       Allergies  Allergen Reactions  . Meperidine Shortness Of Breath and Other (See Comments)    Increased heart rate  Reaction to demerol  . Sulfa Antibiotics Shortness Of Breath and Other (See Comments)    Increased heart rate  . Sulfonamide Derivatives Shortness Of Breath and Other (See Comments)    Increased heart rate  . Eggs Or Egg-Derived Products Nausea And Vomiting    Reaction to egg yolks - not whites   . Foltx [Elfolate Plus] Other (See Comments)    Pt does not recall reaction to this  . [place] Diarrhea, Nausea And Vomiting and Other (See Comments)    Constipation  Reaction to whole milk  . Lactose Intolerance (Gi) Diarrhea, Nausea And Vomiting and Other (See Comments)    Constipation  Reaction to whole milk  . Milk-Related Compounds Diarrhea, Nausea And Vomiting and Other (See Comments)    Constipation  Reaction to whole milk  . Penicillins Other (See Comments)    "Knocks me out."  Has patient had a PCN reaction causing immediate rash, facial/tongue/throat swelling, SOB or lightheadedness with hypotension: yes- blacked out  Has patient had a PCN reaction causing severe rash involving mucus membranes or skin necrosis: unknown  Has patient had a PCN reaction that required hospitalization: at MD office  Has patient had a PCN reaction occurring within the last 10 years:  no  If all of the above answers are "NO", then may proceed with Cephalosporin use.   . Betadine [Povidone Iodine] Itching, Rash and Other (See Comments)    burning         Medications Prior to Admission  Medication Sig Dispense Refill  . acetaminophen (TYLENOL) 500 MG tablet Take 1,000 mg by mouth every 6 (six) hours as needed for mild pain.    Marland Kitchen albuterol (PROVENTIL HFA;VENTOLIN HFA) 108 (90  Base) MCG/ACT inhaler Inhale 1-2 puffs into the lungs every 6 (six) hours as needed for wheezing or shortness of breath. 1 Inhaler 0  . allopurinol (ZYLOPRIM) 300 MG tablet Take 300 mg by mouth daily.     Marland Kitchen amiodarone (PACERONE) 200 MG tablet Take 1 tablet (200 mg total) by mouth daily. 30 tablet 3  . calcitRIOL (ROCALTROL) 0.5 MCG capsule Take 0.5 mcg by mouth Every Tuesday,Thursday,and Saturday with dialysis. Medication given at dialysis on Tues Thurs Sat  1  . diphenhydrAMINE (BENADRYL) 25 MG tablet Take 25 mg by mouth 2 (two) times daily as needed for itching or allergies.     Marland Kitchen glipiZIDE (GLUCOTROL) 5 MG tablet Take 0.5 tablets (2.5 mg total) by mouth 2 (two) times daily. (Patient taking differently: Take 2.5 mg by mouth See admin instructions. Take as needed...one tablet in the morning if BS is greater than 80.)    . Menthol, Topical Analgesic, (ICY HOT EX) Apply 1 application topically 3 (three) times daily as needed (pain).    . multivitamin (RENA-VIT) TABS tablet Take 1 tablet by mouth at bedtime.   0  . oxyCODONE-acetaminophen (PERCOCET) 7.5-325 MG tablet Take 1 tablet by mouth 3 (three) times daily as needed (pain). (Patient taking differently: Take 1 tablet by mouth 4 (four) times daily as needed (pain). ) 20 tablet 0  . sevelamer carbonate (RENVELA) 800 MG tablet Take 2 tablets (1,600 mg total) by mouth 3 (three) times daily with meals. (Patient taking differently: Take 2,400 mg by mouth See admin instructions. Take 3 tablets (2400 mg) by mouth daily with meals - usually twice daily) 90 tablet 0  . carvedilol (COREG) 3.125 MG tablet Take 1 tablet (3.125 mg total) by mouth See admin instructions. Take 1 tablet (3.125 mg) by mouth twice daily on Monday, Wednesday, Friday and Sunday (non-dialysis days) (Patient not taking: Reported on 12/04/2017)    . famotidine (PEPCID) 20 MG tablet Take 1 tablet (20 mg total) by mouth 2 (two) times daily as needed (sour stomach). (Patient not taking: Reported  on 12/04/2017)     Drug Regimen Review  Drug regimen was reviewed and remains appropriate with no significant issues identified  Home:  Home Living  Family/patient expects to be discharged to:: Private residence  Living Arrangements: Spouse/significant other  Available Help at Discharge: Family, Available PRN/intermittently  Type of Home: House  Home Access: Stairs to enter  [employer] of Steps: 5  Entrance Stairs-Rails: Right, Left  Home Layout: Two level  Alternate Level Stairs-Number of Steps: flight  Alternate Level Stairs-Rails: (chair lift )  Bathroom Shower/Tub: Tourist information centre manager: Handicapped height  Home Equipment: Environmental consultant - 2 wheels, Crutches  Functional History:  Prior Function  Level of Independence: Independent with assistive device(s)  Comments: Family reports pt does not drive.  Functional Status:  Mobility:  Bed Mobility  Overal bed mobility: Needs Assistance  Bed Mobility: Supine to Sit, Sit to Supine  Rolling: Mod assist  Sidelying to sit: Max assist  Supine to sit:  Mod assist  Sit to supine: Mod assist  General bed mobility comments: Pt requires assist to move LEs on and off bed and min A to lift trunk  Transfers  Overall transfer level: Needs assistance  Transfer via Lift Equipment: Stedy  Transfers: Sit to/from Stand  Sit to Stand: +2 physical assistance, +2 safety/equipment, From elevated surface, Total assist  Lateral/Scoot Transfers: Max assist, From elevated surface  General transfer comment: transfer from bed to recliner chair. multimodal cues for sequencing and attention to task. mod to max assist +2 for anterior translation and boost up from bed  Ambulation/Gait  General Gait Details: unable at this time   ADL:  ADL  Overall ADL's : Needs assistance/impaired  Eating/Feeding: Moderate assistance, Bed level, Sitting  Grooming: Wash/dry hands, Wash/dry face, Minimal assistance, Sitting  Upper Body Bathing: Moderate  assistance, Sitting  Lower Body Bathing: Maximal assistance, Bed level  Upper Body Dressing : Moderate assistance, Sitting  Lower Body Dressing: Total assistance, Bed level  Toilet Transfer: Total assistance  Toileting- Clothing Manipulation and Hygiene: Bed level, Total assistance  Functional mobility during ADLs: Maximal assistance, +2 for physical assistance, +2 for safety/equipment  Cognition:  Cognition  Overall Cognitive Status: Impaired/Different from baseline  Orientation Level: Oriented to person, Oriented to place, Oriented to situation, Disoriented to time  Cognition  Arousal/Alertness: Awake/alert  Behavior During Therapy: WFL for tasks assessed/performed  Overall Cognitive Status: Impaired/Different from baseline  Area of Impairment: Attention, Memory, Following commands, Safety/judgement, Awareness, Problem solving, Orientation  Orientation Level: Time  Current Attention Level: Sustained, Selective  Memory: Decreased short-term memory  Following Commands: Follows one step commands consistently  Safety/Judgement: Decreased awareness of safety, Decreased awareness of deficits  Awareness: Intellectual  Problem Solving: Slow processing, Difficulty sequencing, Requires verbal cues, Requires tactile cues  General Comments: Pt perseverates on his medications and need to have those brought to hospital. He frequently self distracts  Physical Exam:  Blood pressure 138/82, pulse 77, temperature 98.1 F (36.7 C), temperature source Axillary, resp. rate 18, height 5\' 9"  (1.753 m), weight 102 kg (224 lb 13.9 oz), SpO2 98 %.  Constitutional. Patient is alert. Provides person place and date of birth.  HEENT. Craniotomy site clean and dry  Eyes. Pupils round and reactive to light without discharge  Neck: Normal range of motion. Neck supple. No thyromegaly present.  Cardiovascular: RRR without murmur. No JVD  Respiratory: CTA Bilaterally without wheezes or rales. Normal effort  GI: Soft.  Bowel sounds are normal. He exhibits no distension  Skin. Left artery graft site with some increased swelling nontender  Neurological: He is alert and oriented to person, place, and time.  Follow simple commands. Some delays in processing.  Motor: RUE: 5/5 proximal to distal LUE: 4 to 4/5 proximal to distal RLE: 4+ to 5/5 proximal to distal LLE: HF 2/5, KE 2+/5, ADF 4-/5  Decreased LT distal LE's  Psych: affect flat, cooperative  Lab Results Last 48 Hours  Imaging Results (Last 48 hours)     Medical Problem List and Plan:  1. Decreased functional mobility with left-sided weakness and dysphagia secondary to traumatic right subdural hematoma status post right frontotemporal parietal craniotomy 12/05/2017  -admit to inpatient rehab  2. DVT Prophylaxis/Anticoagulation: SCDs. Monitor for any signs of DVT  3. Pain Management: Oxycodone as needed  4. Mood: Seroquel 25 mg twice a day  5. Neuropsych: This patient is capable of making decisions on his own behalf.  6. Skin/Wound Care: Routine skin checks  7. Fluids/Electrolytes/Nutrition: Routine I&O's with follow-up chemistries  -encourage PO  8. End-stage renal disease. Continue hemodialysis as per renal services. Status post arteriovenous Gore-Tex graft left arm 12/15/2017  -HD scheduled after therapies to  maximize therapy time/performance  9. Diabetes mellitus and peripheral neuropathy. Latest hemoglobin A1c 6.4. SSI. Check blood sugars before meals and at bedtime  10. Acute on chronic anemia. Continue Aranesp.  11. 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 Monitor for any signs of fluid overload  12. Hypertension. Monitor with increased mobility  13. MRSA PCR screening positive. Contact precautions  Post Admission Physician Evaluation:  1. Functional deficits secondary to right SDH. 2. Patient is admitted to receive collaborative, interdisciplinary care between the physiatrist, rehab nursing staff, and therapy team. 3. Patient's level of medical complexity and substantial therapy needs in context of that medical necessity cannot be provided at a lesser intensity of care such as a SNF. 4. Patient has experienced substantial functional loss from his/her baseline which was documented above under the "Functional History" and "Functional Status" headings. Judging by the patient's diagnosis, physical exam, and functional history, the patient has potential for functional progress which will result in measurable gains while on inpatient rehab. These gains will be of substantial and practical use upon discharge in facilitating mobility and self-care at the household level. 5. Physiatrist will provide 24 hour management of medical needs as well as oversight of the therapy plan/treatment and provide guidance as appropriate regarding the interaction of the two. 6. The Preadmission Screening has been reviewed and patient status is unchanged unless otherwise stated above. 7. 24 hour rehab nursing will assist with bladder management, bowel management, safety, skin/wound care, disease management, medication administration, pain management and patient education and help integrate therapy concepts, techniques,education, etc. 8. PT will assess and treat for/with: ADL's, functional mobility, safety,  upper extremity strength, adaptive techniques and equipment, NMR, family education. Goals are: min assist. 9. OT will assess and treat for/with: ADL's, functional mobility, safety, upper extremity strength, adaptive techniques and equipment, NMR, family education. Goals are: min assist. Therapy may proceed with showering this patient. 10. SLP will assess and treat for/with: speech, cognition , communication, family ed. Goals are: min assist. 11. Case Management and Social Worker will assess and treat for psychological issues and discharge planning. 12. Team conference will be held weekly to assess progress toward goals and to determine barriers to discharge. 13. Patient will receive at least 3 hours of therapy per day at least 5 days per week. 14. ELOS: 16-19 days  15. Prognosis: excellent   [name], MD, [facility]  12/16/2017  Lavon Paganini [place]um, PA-C  12/14/2017

## 2017-12-16 NOTE — Progress Notes (Signed)
Rehab admissions - I spoke with Dr. Christella Noa.  I have clearance for acute inpatient rehab admission for today.  Bed available and will admit to inpatient rehab after HD today.  Call me for questions.  #[phone]

## 2017-12-16 NOTE — Discharge Summary (Signed)
Physician Discharge Summary  Patient ID: [facility] MRN: [id number] DOB/AGE: November 17, 1945 72 y.o.  Admit date: 12/05/2017 Discharge date: 12/16/2017  Admission Diagnoses:acute subdural hematoma  Discharge Diagnoses: acute subdural hematoma Active Problems:   S/P craniotomy   Encounter for central line placement   Endotracheally intubated   SDH (subdural hematoma) ([name])   Ventilator dependent ([name])   Chronic renal impairment   Chronic in-center hemodialysis status ([name])   Transaminitis   ESRD (end stage renal disease) ([name])   ESRD (end stage renal disease) on dialysis ([name])   Dysphagia   Acute blood loss anemia   Anemia of chronic disease   Chronic diastolic congestive heart failure ([name])   Diabetes mellitus type 2 in nonobese ([place])   Benign essential HTN   Nonischemic cardiomyopathy ([place])   Discharged Condition: good  Hospital Course: Mr. Sittner was admitted for an emergent craniotomy due to a subdural hematoma on the right side. He went through that procedure and has done very well. He is also on hemodialysis due to ESRD. Unfortunately he lost his vascular access secondary to clotting. He yesterday underwent placement of a left internal jugular vein dialysis catheter.  He is alert, fluent, moving all extremities. While he complains of pain, this is not related to his craniotomy. His wound is clean, dry, and without signs of infection  Consults: pulmonary/intensive care, nephrology and vascular surgery  Significant Diagnostic Studies: none  Treatments: IV hydration, dialysis: Hemodialysis and surgery: Right craniotomy for subdural hematoma evacuation, #1 removal of temporary left internal jugular vein dialysis catheter and placement of a tunneled left internal jugular vein dialysis catheter                         #2: Left upper arm Gore-Tex graft (4 x 7)   Discharge Exam: Blood pressure (!) 106/53, pulse 78, temperature 98.3 F (36.8 C), temperature source Oral,  resp. rate 11, height 5\' 9"  (1.753 m), weight 95 kg (209 lb 7 oz), SpO2 100 %. General appearance: alert, cooperative, appears stated age and mild distress Neurologic: Alert and oriented X 3, normal strength and tone. Normal symmetric reflexes. Normal coordination and gait  Disposition: 01-Home or Self Care  Discharge Instructions    Ambulatory referral to Physical Medicine Rehab   Complete by:  As directed    1 month post hospital TBI follow up     Allergies as of 12/16/2017      Reactions   Meperidine Shortness Of Breath, Other (See Comments)   Increased heart rate Reaction to demerol   Penicillins Other (See Comments)   "Knocks me out."   PATIENT HAS HAD A PCN REACTION WITH IMMEDIATE RASH, FACIAL/TONGUE/THROAT SWELLING, SOB, OR LIGHTHEADEDNESS WITH HYPOTENSION:  # # #  YES  >> SYNCOPE ### Has patient had a PCN reaction causing severe rash involving mucus membranes or skin necrosis: unknown PATIENT HAS HAD A PCN REACTION THAT REQUIRED TREATMENT AT MD'S OFFICE Has patient had a PCN reaction occurring within the last 10 years: no   Sulfa Antibiotics Shortness Of Breath, Other (See Comments)   Increased heart rate   Sulfonamide Derivatives Shortness Of Breath, Other (See Comments)   TACHYCARDIA   Lac Bovis Diarrhea, Nausea And Vomiting, Other (See Comments)   Constipation Reaction to whole milk   Lactose Intolerance (gi) Diarrhea, Nausea And Vomiting, Other (See Comments)   Constipation Reaction to whole milk   Milk-related Compounds Diarrhea, Nausea And Vomiting, Other (See Comments)   Constipation  Reaction to whole milk   Foltx [elfolate Plus]    UNSPECIFIED REACTION  "Pt does not recall reaction to this"   Betadine [povidone Iodine] Itching, Rash, Other (See Comments)   burning   Eggs Or Egg-derived Products Nausea And Vomiting   Reaction to egg yolks - not whites      Medication List    TAKE these medications   acetaminophen 500 MG tablet Commonly known as:   TYLENOL Take 1,000 mg by mouth every 6 (six) hours as needed for mild pain.   albuterol 108 (90 Base) MCG/ACT inhaler Commonly known as:  PROVENTIL HFA;VENTOLIN HFA Inhale 1-2 puffs into the lungs every 6 (six) hours as needed for wheezing or shortness of breath.   allopurinol 300 MG tablet Commonly known as:  ZYLOPRIM Take 300 mg by mouth daily.   amiodarone 200 MG tablet Commonly known as:  PACERONE Take 1 tablet (200 mg total) by mouth daily.   calcitRIOL 0.5 MCG capsule Commonly known as:  ROCALTROL Take 0.5 mcg by mouth Every Tuesday,Thursday,and Saturday with dialysis. Medication given at dialysis on Tues Thurs Sat   carvedilol 3.125 MG tablet Commonly known as:  COREG Take 1 tablet (3.125 mg total) by mouth See admin instructions. Take 1 tablet (3.125 mg) by mouth twice daily on Monday, Wednesday, Friday and Sunday (non-dialysis days)   diphenhydrAMINE 25 MG tablet Commonly known as:  BENADRYL Take 25 mg by mouth 2 (two) times daily as needed for itching or allergies.   famotidine 20 MG tablet Commonly known as:  PEPCID Take 1 tablet (20 mg total) by mouth 2 (two) times daily as needed (sour stomach).   glipiZIDE 5 MG tablet Commonly known as:  GLUCOTROL Take 0.5 tablets (2.5 mg total) by mouth 2 (two) times daily. What changed:    when to take this  additional instructions   ICY HOT EX Apply 1 application topically 3 (three) times daily as needed (pain).   multivitamin Tabs tablet Take 1 tablet by mouth at bedtime.   oxyCODONE-acetaminophen 7.5-325 MG tablet Commonly known as:  PERCOCET Take 1 tablet by mouth 3 (three) times daily as needed (pain). What changed:  when to take this   sevelamer carbonate 800 MG tablet Commonly known as:  RENVELA Take 2 tablets (1,600 mg total) by mouth 3 (three) times daily with meals. What changed:    how much to take  when to take this  additional instructions      Follow-up Information    Serafina Mitchell, MD  Follow up in 2 week(s).   Specialties:  Vascular Surgery, Cardiology Why:  Office will call you to arrange your appt (sent) Contact information: [place] [address] [phone]        Ashok Pall, MD Follow up in 3 week(s).   Specialty:  Neurosurgery Why:  call for appt in 3 weeks Contact information: [address] [place] [address] [place] [address] [phone]           Signed: Winfield Cunas 12/16/2017, 2:57 PM

## 2017-12-17 ENCOUNTER — Inpatient Hospital Stay (HOSPITAL_COMMUNITY): Payer: Medicare Other | Admitting: Occupational Therapy

## 2017-12-17 ENCOUNTER — Inpatient Hospital Stay (HOSPITAL_COMMUNITY): Payer: Medicare Other

## 2017-12-17 ENCOUNTER — Inpatient Hospital Stay (HOSPITAL_COMMUNITY): Payer: Medicare Other | Admitting: Speech Pathology

## 2017-12-17 ENCOUNTER — Encounter (HOSPITAL_COMMUNITY): Payer: Self-pay | Admitting: Surgery

## 2017-12-17 ENCOUNTER — Inpatient Hospital Stay (HOSPITAL_COMMUNITY): Payer: Medicare Other | Admitting: Physical Therapy

## 2017-12-17 DIAGNOSIS — S065X3S Traumatic subdural hemorrhage with loss of consciousness of 1 hour to 5 hours 59 minutes, sequela: Secondary | ICD-10-CM

## 2017-12-17 DIAGNOSIS — D649 Anemia, unspecified: Secondary | ICD-10-CM

## 2017-12-17 DIAGNOSIS — M7989 Other specified soft tissue disorders: Secondary | ICD-10-CM

## 2017-12-17 LAB — TYPE AND SCREEN
ABO/RH(D): O POS
ANTIBODY SCREEN: POSITIVE
DAT, IGG: NEGATIVE
UNIT DIVISION: 0
Unit division: 0

## 2017-12-17 LAB — GLUCOSE, CAPILLARY
GLUCOSE-CAPILLARY: 165 mg/dL — AB (ref 65–99)
Glucose-Capillary: 214 mg/dL — ABNORMAL HIGH (ref 65–99)
Glucose-Capillary: 151 mg/dL — ABNORMAL HIGH (ref 65–99)
Glucose-Capillary: 115 mg/dL — ABNORMAL HIGH (ref 65–99)
Glucose-Capillary: 168 mg/dL — ABNORMAL HIGH (ref 65–99)

## 2017-12-17 LAB — BPAM RBC
BLOOD PRODUCT EXPIRATION DATE: 201904072359
Blood Product Expiration Date: 201904052359
UNIT TYPE AND RH: 9500
Unit Type and Rh: 9500

## 2017-12-17 MED ORDER — BISACODYL 5 MG PO TBEC
10.0000 mg | DELAYED_RELEASE_TABLET | Freq: Every day | ORAL | Status: DC | PRN
  Administered 2017-12-17 – 2018-01-03 (×6): 10 mg via ORAL
  Filled 2017-12-17 (×5): qty 2

## 2017-12-17 MED ORDER — PRO-STAT SUGAR FREE PO LIQD
30.0000 mL | Freq: Two times a day (BID) | ORAL | Status: DC
  Administered 2017-12-19 – 2018-01-05 (×30): 30 mL via ORAL
  Filled 2017-12-17 (×33): qty 30

## 2017-12-17 MED ORDER — DIPHENHYDRAMINE HCL 25 MG PO CAPS
25.0000 mg | ORAL_CAPSULE | Freq: Four times a day (QID) | ORAL | Status: DC | PRN
  Administered 2017-12-17 – 2018-01-05 (×31): 25 mg via ORAL
  Filled 2017-12-17 (×31): qty 1

## 2017-12-17 MED ORDER — PANTOPRAZOLE SODIUM 40 MG PO TBEC
40.0000 mg | DELAYED_RELEASE_TABLET | Freq: Every day | ORAL | Status: DC
  Administered 2017-12-18 – 2018-01-04 (×18): 40 mg via ORAL
  Filled 2017-12-17 (×19): qty 1

## 2017-12-17 NOTE — Evaluation (Signed)
Speech Language Pathology Assessment and Plan  Patient Details  Name: Albert [facility] MRN: [id number] Date of Birth: 1946-07-31  SLP Diagnosis: Cognitive Impairments;Dysphagia  Rehab Potential: Good ELOS:  21-24 days    Today's Date: 12/17/2017 SLP Individual Time: 1105-1200 SLP Individual Time Calculation (min): 55 min   Problem List:  Patient Active Problem List   Diagnosis Date Noted  . Traumatic subdural hematoma ([facility]) 12/16/2017  . ESRD (end stage renal disease) ([facility])   . ESRD (end stage renal disease) on dialysis ([facility])   . Dysphagia   . Acute blood loss anemia   . Anemia of chronic disease   . Chronic diastolic congestive heart failure ([facility])   . Diabetes mellitus type 2 in nonobese ([facility])   . Benign essential HTN   . Nonischemic cardiomyopathy ([facility])   . Transaminitis   . Encounter for central line placement   . Endotracheally intubated   . SDH (subdural hematoma) ([facility])   . Ventilator dependent ([name])   . Chronic renal impairment   . Chronic in-center hemodialysis status ([facility])   . S/P craniotomy 12/05/2017  . Hyperkalemia 12/02/2017  . Malnutrition of moderate degree 12/18/2016  . PVC's (premature ventricular contractions)   . Anemia associated with chronic renal failure   . End-stage renal disease on hemodialysis ([facility]) 12/09/2016  . Dyslipidemia associated with type 2 diabetes mellitus ([facility]) 12/09/2016  . Acute on chronic systolic and diastolic heart failure, NYHA class 1 ([name]) 12/09/2016  . Diabetes mellitus with diabetic nephropathy without long-term current use of insulin ([facility] Stanley) 12/09/2016  . Type 2 diabetes mellitus ([facility]bert Stanley) 12/11/2015  . Fluid overload 12/06/2015  . History of DVT (deep vein thrombosis) 12/04/2015  . Renal hematoma 12/04/2015  . H/O: gout 11/16/2013  . Trochanteric bursitis 10/17/2013  . 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 Lumbar degenerative disc disease 07/17/2013   Past Medical History:  Past Medical History:  Diagnosis Date  . Arthritis    Osteoarthritis  . Asthma   . Bladder cancer (Albert Stanley) dx'd 1990   surg only  . CHF (congestive heart failure) ([facility])   . Diabetes mellitus without complication ([facility])   . ESRD (end stage renal disease) ([facility])    Tues, Thurs, Sat dialysis  . GERD (gastroesophageal reflux disease)   . History of cardiac catheterization    a. LHC 5/17: no obstructive CAD  . History of DVT (deep vein thrombosis)    2003 ish  . History of nuclear stress test    a. Myoview 4/17: EF 39%, inf, inf-lat, apical inf, apical lat, apical scar, intermediate risk  . Hyperlipidemia   . Hypertension   . NICM (nonischemic cardiomyopathy) ([facility]bert Stanley)    a. Echo 2/17: mod LVH, EF 35-40%, inf-lat and inf-sept HK, mod MR, severe LAE, small pericardial effusion   Past Surgical History:  Past Surgical History:  Procedure Laterality Date  . arthroscopic knee surgery Left   . AV FISTULA PLACEMENT Left 12/13/2015   Procedure: ARTERIOVENOUS (AV) FISTULA CREATION;  Surgeon: Angelia Mould, MD;  Location[facility];  Service: Vascular;  Laterality: Left;  . AV FISTULA PLACEMENT Left 12/15/2017   Procedure: INSERTION OF ARTERIOVENOUS (AV) GORE-TEX GRAFT ARM;  Surgeon: Serafina Mitchell, MD;  Location: [place] [facility];  Service: Vascular;  Laterality: Left;  . BACK SURGERY     2 times  1960  . CARDIAC CATHETERIZATION N/A 02/28/2016   Procedure: Left Heart Cath and Coronary Angiography;  Surgeon: Larey Dresser, MD;  Location: [place];  Service:  Cardiovascular;  Laterality: N/A;  . CRANIOTOMY N/A 12/05/2017   Procedure: CRANIOTOMY HEMATOMA EVACUATION SUBDURAL;  Surgeon: Ashok Pall, MD;  Location: [place];  Service: Neurosurgery;  Laterality: N/A;  . CYSTOSCOPY    . FISTULA SUPERFICIALIZATION Left 08/14/2016   Procedure: FISTULA SUPERFICIALIZATION LEFT UPPER ARM;  Surgeon: Angelia Mould, MD;  Location: [name];  Service: Vascular;  Laterality: Left;  . INSERTION OF DIALYSIS CATHETER N/A 12/13/2015   Procedure: INSERTION OF DIALYSIS  CATHETER;  Surgeon: Angelia Mould, MD;  Location: [name];  Service: Vascular;  Laterality: N/A;  . INSERTION OF DIALYSIS CATHETER Left 05/07/2017   Procedure: INSERTION OF DIALYSIS CATHETER LEFT INTERNAL JUGULAR PLACEMENT;  Surgeon: Angelia Mould, MD;  Location: [place];  Service: Vascular;  Laterality: Left;  . INSERTION OF DIALYSIS CATHETER Left 12/15/2017   Procedure: INSERTION OF DIALYSIS CATHETER;  Surgeon: Serafina Mitchell, MD;  Location: [facility];  Service: Vascular;  Laterality: Left;  . IR RADIOLOGIST EVAL & MGMT  05/12/2017  . JOINT REPLACEMENT Left    hip  . LIGATION OF COMPETING BRANCHES OF ARTERIOVENOUS FISTULA Left 08/14/2016   Procedure: LIGATION OF COMPETING BRANCHES OF ARTERIOVENOUS FISTULA LEFT UPPER ARM;  Surgeon: Angelia Mould, MD;  Location: [name];  Service: Vascular;  Laterality: Left;  . PERIPHERAL VASCULAR CATHETERIZATION Left 06/09/2016   Procedure: Fistulagram;  Surgeon: Serafina Mitchell, MD;  Location: [name];  Service: Cardiovascular;  Laterality: Left;  ARM  . PERIPHERAL VASCULAR CATHETERIZATION Left 06/09/2016   Procedure: Peripheral Vascular Balloon Angioplasty;  Surgeon: Serafina Mitchell, MD;  Location: [name];  Service: Cardiovascular;  Laterality: Left;  upper arm venous  . PERIPHERAL VASCULAR CATHETERIZATION Left 06/25/2016   Procedure: Fistulagram;  Surgeon: Serafina Mitchell, MD;  Location: [name];  Service: Cardiovascular;  Laterality: Left;  . REVISON OF ARTERIOVENOUS FISTULA Left 05/07/2017   Procedure: REPAIR OF PSEUDOANEURYSM  LEFT BRACHIO-CEPHALIC  ARM ARTERIOVENOUS FISTULA;  Surgeon: Angelia Mould, MD;  [facility]: [facility];  Service: Vascular;  Laterality: Left;    Assessment / Plan / Recommendation Clinical Impression   Albert Stanley is a 72 y.o. right handed male with history of bladder cancer with resection in 1999 lymphedema, end-stage renal disease with hemodialysis, diastolic congestive heart  failure, diabetes mellitus, hypertension, nonischemic cardiomyopathy. History taken from chart review. Patient lives with spouse and daughter. Independent with assistive device prior to admission. Independent ADLs and still driving. Wife works during the day. His daughter is in and out during the day. Presented 12/05/2017 with fatigue, vomiting and diarrhea 2 days as well as headache. He had missed his last dialysis session. Noted fall 12/03/2017 when he tripped on the carpet. Denied striking his head. Cranial CT reviewed showing right SDH. Per report, large right side subdural hematoma measuring 16 mm in width with significant mass effect and a 13 mm right to left midline shift. Mass effect effaces the sulci and right lateral ventricle. Uncal herniation present. Neurosurgery consulted underwent right frontotemporal parietal craniotomy hematoma evacuation 12/05/2017 per Dr. Cyndy Freeze. Hospital course hemodialysis ongoing with conversion of left temporary dialysis catheter to tunneled dialysis catheter after his left BC AVF thrombosed 12/15/2017 per Dr. Trula Slade. Intermittent bouts of restlessness maintained on Seroquel. Acute on chronic anemia 7.5 and monitored . Dysphagia #1 thin liquid diet. Nasogastric tube remains in place. MRSA PCR screening positive maintained on contact precautions. Physical and occupational therapy evaluations completed with recommendations of physical medicine rehabilitation consult. Patient  was admitted for a comprehensive rehabilitation program.  SLP evaluation was completed on 12/17/2017 with the following results:  Pt presents with mild-moderate cognitive dysfunction as supported by a score of 19/30 on the MoCA standardized cognitive assessment.  Deficits are in the areas of selective attention, intellectual/emergent awareness, problem solving, safety awareness, and recall.  Pt presents with behaviors consistent with [place] Level VII-VIII and needed mod assist verbal cues to complete tasks  due to abovementioned deficits.   SLP also completed a bedside swallow evaluation.  Given improved alertness and attention to task, pt was able to consume regular textures and thin liquids with mod I use of swallowing precautions and no overt s/s of aspiration with solids or liquids.  Recommend that pt be advanced to regular textures and thin liquids with intermittent supervision for use of swallowing precautions.  Pt would benefit from ST follow up for dysphagia to ensure consistent toleration.   Given the abovementioned deficits, pt would benefit from skilled ST while inpatient in order to maximize functional independence and reduce burden of care prior to discharge.  Anticipate that pt will need 24/7 supervision at discharge in addition to [place] follow up at next level of care.       Skilled Therapeutic Interventions          Cognitive-linguistic and bedside swallow evaluation completed with results and recommendations reviewed with family.    SLP Assessment  Patient will need skilled Speech Lanaguage Pathology Services during CIR admission    Recommendations  SLP Diet Recommendations: Age appropriate regular solids;Thin Liquid Administration via: Cup;Straw Medication Administration: Whole meds with liquid Supervision: Patient able to self feed Compensations: Slow rate;Small sips/bites Postural Changes and/or Swallow Maneuvers: Seated upright 90 degrees Oral Care Recommendations: Oral care BID Recommendations for Other Services: Neuropsych consult Patient destination: Home Follow up Recommendations: Home Health SLP;Outpatient SLP;24 hour supervision/assistance Equipment Recommended: None recommended by SLP    SLP Frequency 3 to 5 out of 7 days   SLP Duration  SLP Intensity  SLP Treatment/Interventions    Minumum of 1-2 x/day, 30 to 90 minutes  Cognitive remediation/compensation;Cueing hierarchy;Dysphagia/aspiration precaution training;Patient/family education;Environmental  controls;Functional tasks    Pain Pain Assessment Pain Assessment: 0-10 Pain Score: 8  Faces Pain Scale: Hurts even more Pain Type: Acute pain Pain Location: Leg Pain Orientation: Right Pain Descriptors / Indicators: Aching Pain Onset: On-going Pain Intervention(s): RN made aware  Prior Functioning Cognitive/Linguistic Baseline: Within functional limits Type of Home: House  Lives With: Spouse Available Help at Discharge: Family;Available PRN/intermittently Vocation: Retired  Function:  Eating Eating   Modified Consistency Diet: No Eating Assist Level: Swallowing techniques: self managed           Cognition Comprehension Comprehension assist level: Follows basic conversation/direction with extra time/assistive device  Expression   Expression assist level: Expresses basic needs/ideas: With no assist  Social Interaction Social Interaction assist level: Interacts appropriately 90% of the time - Needs monitoring or encouragement for participation or interaction.  Problem Solving Problem solving assist level: Solves basic 50 - 74% of the time/requires cueing 25 - 49% of the time  Memory Memory assist level: Recognizes or recalls 50 - 74% of the time/requires cueing 25 - 49% of the time   Short Term Goals: Week 1: SLP Short Term Goal 1 (Week 1): Pt will consume regular textures and thin liquids with mod I use of swallowing precautions and minimal overt s/s of aspiration.  SLP Short Term Goal 2 (Week 1): Pt will selectively attend to  tasks in a moderately distracting environment for 15 minutes with supervision cues for redirection to task.   SLP Short Term Goal 3 (Week 1): Pt will complete semi-complex tasks with min verbal cues for functional problem solving.   SLP Short Term Goal 4 (Week 1): Pt will recall semi-complex daily information with min verbal cues for use of external aids.   SLP Short Term Goal 5 (Week 1): Pt will recognize and correct errors in the moment during  structured tasks with min verbal cues.    Refer to Care Plan for Long Term Goals  Recommendations for other services: Neuropsych  Discharge Criteria: Patient will be discharged from SLP if patient refuses treatment 3 consecutive times without medical reason, if treatment goals not met, if there is a change in medical status, if patient makes no progress towards goals or if patient is discharged from hospital.  The above assessment, treatment plan, treatment alternatives and goals were discussed and mutually agreed upon: by patient  Emilio Math 12/17/2017, 12:55 PM

## 2017-12-17 NOTE — Evaluation (Signed)
Physical Therapy Assessment and Plan  Patient Details  Name: Albert Stanley MRN: [id number] Date of Birth: 1946/03/29  PT Diagnosis: Abnormality of gait, Coordination disorder, Difficulty walking, Hemiplegia (L side), Impaired cognition and Muscle weakness Rehab Potential: Good ELOS: 3-4 weeks   Today's Date: 12/17/2017 PT Individual Time: 0805-0902 PT Individual Time Calculation (min): 57 min    Problem List:  Patient Active Problem List   Diagnosis Date Noted  . Traumatic subdural hematoma ([place]) 12/16/2017  . ESRD (end stage renal disease) ([place])   . ESRD (end stage renal disease) on dialysis ([place])   . Dysphagia   . Acute blood loss anemia   . Anemia of chronic disease   . Chronic diastolic congestive heart failure ([place])   . Diabetes mellitus type 2 in nonobese ([name])   . Benign essential HTN   . Nonischemic cardiomyopathy ([place])   . Transaminitis   . Encounter for central line placement   . Endotracheally intubated   . SDH (subdural hematoma) ([place])   . Ventilator dependent ([place])   . Chronic renal impairment   . Chronic in-center hemodialysis status ([place])   . S/P craniotomy 12/05/2017  . Hyperkalemia 12/02/2017  . Malnutrition of moderate degree 12/18/2016  . PVC's (premature ventricular contractions)   . Anemia associated with chronic renal failure   . End-stage renal disease on hemodialysis ([place]) 12/09/2016  . Dyslipidemia associated with type 2 diabetes mellitus ([place]) 12/09/2016  . Acute on chronic systolic and diastolic heart failure, NYHA class 1 ([place]) 12/09/2016  . 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 Type 2 diabetes mellitus ([place]) 12/11/2015  . Fluid overload 12/06/2015  . History of DVT (deep vein thrombosis) 12/04/2015  . Renal hematoma 12/04/2015  . H/O: gout 11/16/2013  . Trochanteric bursitis 10/17/2013  . 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 Lumbar degenerative disc disease 07/17/2013    Past  Medical History:  Past Medical History:  Diagnosis Date  . Arthritis    Osteoarthritis  . Asthma   . Bladder cancer ([place]) dx'd 1990   surg only  . CHF (congestive heart failure) ([place])   . Diabetes mellitus without complication ([place])   . ESRD (end stage renal disease) ([name])    Tues, Thurs, Sat dialysis  . GERD (gastroesophageal reflux disease)   . History of cardiac catheterization    a. LHC 5/17: no obstructive CAD  . History of DVT (deep vein thrombosis)    2003 ish  . History of nuclear stress test    a. Myoview 4/17: EF 39%, inf, inf-lat, apical inf, apical lat, apical scar, intermediate risk  . Hyperlipidemia   . Hypertension   . NICM (nonischemic cardiomyopathy) ([place])    a. Echo 2/17: mod LVH, EF 35-40%, inf-lat and inf-sept HK, mod MR, severe LAE, small pericardial effusion   Past Surgical History:  Past Surgical History:  Procedure Laterality Date  . arthroscopic knee surgery Left   . AV FISTULA PLACEMENT Left 12/13/2015   Procedure: ARTERIOVENOUS (AV) FISTULA CREATION;  Surgeon: Angelia Mould, MD;  [facility];  Service: Vascular;  Laterality: Left;  . AV FISTULA PLACEMENT Left 12/15/2017   Procedure: INSERTION OF ARTERIOVENOUS (AV) GORE-TEX GRAFT ARM;  Surgeon: Serafina Mitchell, MD;  Location: [place];  Service: Vascular;  Laterality: Left;  . BACK SURGERY     2 times  1960  . CARDIAC CATHETERIZATION N/A 02/28/2016   Procedure: Left Heart Cath and Coronary Angiography;  Surgeon:  Larey Dresser, MD;  Location: [place] [facility];  Service: Cardiovascular;  Laterality: N/A;  . CRANIOTOMY N/A 12/05/2017   Procedure: CRANIOTOMY HEMATOMA EVACUATION SUBDURAL;  Surgeon: Ashok Pall, MD;  Location: [place];  Service: Neurosurgery;  Laterality: N/A;  . CYSTOSCOPY    . FISTULA SUPERFICIALIZATION Left 08/14/2016   Procedure: FISTULA SUPERFICIALIZATION LEFT UPPER ARM;  Surgeon: Angelia Mould, MD;  Location: [place];  Service: Vascular;  Laterality: Left;  . INSERTION  OF DIALYSIS CATHETER N/A 12/13/2015   Procedure: INSERTION OF DIALYSIS CATHETER;  Surgeon: Angelia Mould, MD;  Location: [place];  Service: Vascular;  Laterality: N/A;  . INSERTION OF DIALYSIS CATHETER Left 05/07/2017   Procedure: INSERTION OF DIALYSIS CATHETER LEFT INTERNAL JUGULAR PLACEMENT;  Surgeon: Angelia Mould, MD;  Location: [place];  Service: Vascular;  Laterality: Left;  . INSERTION OF DIALYSIS CATHETER Left 12/15/2017   Procedure: INSERTION OF DIALYSIS CATHETER;  Surgeon: Serafina Mitchell, MD;  Location: [facility];  Service: Vascular;  Laterality: Left;  . IR RADIOLOGIST EVAL & MGMT  05/12/2017  . JOINT REPLACEMENT Left    hip  . LIGATION OF COMPETING BRANCHES OF ARTERIOVENOUS FISTULA Left 08/14/2016   Procedure: LIGATION OF COMPETING BRANCHES OF ARTERIOVENOUS FISTULA LEFT UPPER ARM;  Surgeon: Angelia Mould, MD;  Location: [place];  Service: Vascular;  Laterality: Left;  . PERIPHERAL VASCULAR CATHETERIZATION Left 06/09/2016   Procedure: Fistulagram;  Surgeon: Serafina Mitchell, MD;  Location: [place] [facility];  Service: Cardiovascular;  Laterality: Left;  ARM  . PERIPHERAL VASCULAR CATHETERIZATION Left 06/09/2016   Procedure: Peripheral Vascular Balloon Angioplasty;  Surgeon: Serafina Mitchell, MD;  Location: [place] [facility];  Service: Cardiovascular;  Laterality: Left;  upper arm venous  . PERIPHERAL VASCULAR CATHETERIZATION Left 06/25/2016   Procedure: Fistulagram;  Surgeon: Serafina Mitchell, MD;  Location: [place] [facility];  Service: Cardiovascular;  Laterality: Left;  . REVISON OF ARTERIOVENOUS FISTULA Left 05/07/2017   Procedure: REPAIR OF PSEUDOANEURYSM  LEFT BRACHIO-CEPHALIC  ARM ARTERIOVENOUS FISTULA;  Surgeon: Angelia Mould, MD;  Location: [facility] OR;  Service: Vascular;  Laterality: Left;    Assessment & Plan Clinical Impression: Patient is a 72 y.o. year old male with history of bladder cancer with resection in 1999 lymphedema, end-stage renal disease with  hemodialysis, diastolic congestive heart failure, diabetes mellitus, hypertension, nonischemic cardiomyopathy. History taken from chart review. Patient lives with spouse and daughter. Independent with assistive device prior to admission. Independent ADLs and still driving. Wife works during the day. His daughter is in and out during the day. Presented 12/05/2017 with fatigue, vomiting and diarrhea 2 days as well as headache. He had missed his last dialysis session. Noted fall 12/03/2017 when he tripped on the carpet. Denied striking his head. Cranial CT reviewed showing right SDH. Per report, large right side subdural hematoma measuring 16 mm in width with significant mass effect and a 13 mm right to left midline shift. Mass effect effaces the sulci and right lateral ventricle. Uncal herniation present. Neurosurgery consulted underwent right frontotemporal parietal craniotomy hematoma evacuation 12/05/2017 per Dr. Cyndy Freeze. Hospital course hemodialysis ongoing with conversion of left temporary dialysis catheter to tunneled dialysis catheter after his left BC AVF thrombosed 12/15/2017 per Dr. Trula Slade. Intermittent bouts of restlessness maintained on Seroquel. Acute on chronic anemia 7.5 and monitored . Dysphagia #1 thin liquid diet. Nasogastric tube remains in place. MRSA PCR screening positive maintained on contact precautions. Physical and occupational therapy evaluations completed with recommendations  of physical medicine rehabilitation consult. Patient was admitted for a comprehensive rehabilitation program.  Patient transferred to CIR on 12/16/2017 .   Patient currently requires max with mobility secondary to muscle weakness, decreased cardiorespiratoy endurance, decreased coordination, decreased visual acuity, decreased attention, decreased awareness, decreased problem solving, decreased safety awareness, decreased memory and delayed processing, and decreased standing balance, decreased postural control,  hemiplegia and decreased balance strategies.  Prior to hospitalization, patient was modified independent  with RW with mobility and lived with Spouse, Daughter(adult daughter) in a House home.  Home access is 5Stairs to enter.  Patient will benefit from skilled PT intervention to maximize safe functional mobility, minimize fall risk and decrease caregiver burden for planned discharge home with 24 hour assist.  Anticipate patient will benefit from follow up [facility] at discharge.  PT - End of Session Activity Tolerance: Tolerates 30+ min activity with multiple rests Endurance Deficit: Yes Endurance Deficit Description: 2/2 fatigue, weakness PT Assessment Rehab Potential (ACUTE/IP ONLY): Good PT Barriers to Discharge: Decreased caregiver support(unsure if pt's wife & daughter can provide 24 hr assist/supervision upon d/c) PT Patient demonstrates impairments in the following area(s): Balance;Safety;Behavior;Sensory;Edema;Skin Integrity;Endurance;Motor;Pain;Perception PT Transfers Functional Problem(s): Bed Mobility;Bed to Chair;Car;Furniture PT Locomotion Functional Problem(s): Ambulation;Wheelchair Mobility;Stairs PT Plan PT Intensity: Minimum of 1-2 x/day ,45 to 90 minutes PT Frequency: 5 out of 7 days PT Duration Estimated Length of Stay: 3-4 weeks PT Treatment/Interventions: Ambulation/gait training;Community reintegration;DME/adaptive equipment instruction;Neuromuscular re-education;Psychosocial support;Stair training;UE/LE Strength taining/ROM;Wheelchair propulsion/positioning;Balance/vestibular training;Discharge planning;Pain management;Skin care/wound management;Therapeutic Activities;UE/LE Coordination activities;Visual/perceptual remediation/compensation;Therapeutic Exercise;Splinting/orthotics;Patient/family education;Functional mobility training;Disease management/prevention;Cognitive remediation/compensation PT Transfers Anticipated Outcome(s): supervision with LRAD PT Locomotion Anticipated  Outcome(s): min assist with LRAD PT Recommendation Recommendations for Other Services: Speech consult;Neuropsych consult;Therapeutic Recreation consult Therapeutic Recreation Interventions: Pet therapy;Stress management Follow Up Recommendations: (HHPT & 24 hr assist vs SNF) Equipment Recommended: To be determined  Skilled Therapeutic Intervention Pt received in bed & agreeable to tx. Pt with c/o pain in head - RN aware & reports pt is premedicated. Provided pt with 20 inch wide w/c & cushion for increased comfort and positioning when OOB. Educated pt on weekly interdisciplinary team meeting, ELOS, daily therapy schedule, need to have nursing assistance to transfer out of bed or w/c, and other various CIR information. Educated pt on rolling supine>R sidelying but pt unable to follow commands and attempts to transfer supine>sitting ultimately requiring mod assist to transfer LE to EOB & to transfer trunk to upright sitting. Pt reporting pain in B calves when therapist touched LE, pt requesting therapist assist at ankles. Pt completes bed>w/c transfer with max assist via lateral scoot with therapist providing manual facilitation for anterior weight shifting, blocking L knee, and weight shifting to scoot across to w/c; pt with difficulty stabilizing with LUE. Pt able to recognize he's not sitting square in w/c and requires assistance to reposition. Oriented pt to unit. Pt propelled w/c 10 ft + 25 ft with BUE with difficulty grasping drive wheel with L hand and requiring min assist to maintain linear trajectory as a result. At end of session pt left sitting in w/c with QRB donned & all needs within reach; reviewed use of call bell with pt.   PT Evaluation Precautions/Restrictions Precautions Precautions: Fall Precaution Comments: weak left knee (had problems with it before) Restrictions Weight Bearing Restrictions: No  General Chart Reviewed: Yes Additional Pertinent History: bladder CA resection,  lymphedema, ERSD, diastolic CHF, DM, HTN, nonischemic cardiomyopathy, HLD Response to Previous Treatment: Not applicable Family/Caregiver Present: No  Pain Pt c/o HA -  RN aware & pt premedicated.   Home Living/Prior Functioning Home Living Available Help at Discharge: Family;Available PRN/intermittently(wife & daughter work during the day) Type of Home: House Home Access: Stairs to enter Technical brewer of Steps: 5 Entrance Stairs-Rails: Right;Left Home Layout: Two level Alternate Level Stairs-Number of Steps: flight - has a chair lift  Lives With: Spouse;Daughter(adult daughter) Prior Function Level of Independence: Requires assistive device for independence;Independent with gait;Independent with transfers Vocation: Retired  Vision/Perception  PTA pt used glasses for reading only. Pt now reports diplopia.   Cognition Overall Cognitive Status: Impaired/Different from baseline Arousal/Alertness: Awake/alert Orientation Level: Oriented X4 Memory: Impaired Awareness: Impaired Safety/Judgment: Impaired  Sensation Coordination Gross Motor Movements are Fluid and Coordinated: (impaired coordination LUE, LLE)  Motor  Motor Motor: Hemiplegia(LUE/LLE) Motor - Skilled Clinical Observations: general weakness   Mobility Bed Mobility Bed Mobility: Supine to Sit Supine to Sit: 3: Mod assist;With rails;HOB elevated Supine to Sit Details: Tactile cues for initiation;Tactile cues for weight shifting;Tactile cues for sequencing;Tactile cues for placement;Verbal cues for technique;Verbal cues for sequencing;Manual facilitation for placement;Manual facilitation for weight shifting(cuing to roll to R sidelying then transfer to sitting but pt unable to follow instructions) Transfers Transfers: Yes Lateral/Scoot Transfers: 2: Max assist Lateral/Scoot Transfer Details: Tactile cues for initiation;Tactile cues for weight shifting;Tactile cues for placement;Tactile cues for  sequencing;Tactile cues for posture;Verbal cues for sequencing;Verbal cues for technique;Verbal cues for precautions/safety;Manual facilitation for placement;Manual facilitation for weight shifting(blocking LLE, facilitating anterior weight shifting & shifting to L to w/c)  Locomotion  Ambulation Ambulation: No Gait Gait: No Stairs / Additional Locomotion Stairs: No Wheelchair Mobility Wheelchair Mobility: Yes Wheelchair Assistance: 4: Advertising account executive Details: (assistance to maintain linear trajectory 2/2 weak LUE) Wheelchair Propulsion: Both upper extremities(decreased grip on drive wheel with LUE, impaired strength L hand) Wheelchair Parts Management: Needs assistance Distance: 25 ft   Balance Balance Balance Assessed: Yes Static Sitting Balance Static Sitting - Balance Support: Feet supported Static Sitting - Level of Assistance: 5: Stand by assistance   Extremity Assessment  RUE Assessment RUE Assessment: Within Functional Limits LUE Assessment LUE Assessment: (ROM WFL, general weakness) RLE Assessment RLE Assessment: Within Functional Limits LLE Assessment LLE Assessment: (2/5 hip flexion, 2/5 knee extension, 2/5 dorsiflexion)   See Function Navigator for Current Functional Status.   Refer to Care Plan for Long Term Goals  Recommendations for other services: Neuropsych and Therapeutic Recreation  Pet therapy and Stress management  Discharge Criteria: Patient will be discharged from PT if patient refuses treatment 3 consecutive times without medical reason, if treatment goals not met, if there is a change in medical status, if patient makes no progress towards goals or if patient is discharged from hospital.  The above assessment, treatment plan, treatment alternatives and goals were discussed and mutually agreed upon: by patient  [name] 12/17/2017, 4:17 PM

## 2017-12-17 NOTE — Progress Notes (Addendum)
[facility] PHYSICAL MEDICINE & REHABILITATION     PROGRESS NOTE    Subjective/Complaints: Was restless last night. Didn't sleep well. Refusing to eat anything except for cream of wheat and similar foods.  ROS: pt denies nausea, vomiting, diarrhea, cough, shortness of breath or chest pain, rash, blurred vision, mood change   Objective: Vital Signs: Blood pressure 121/79, pulse 79, temperature 99.5 F (37.5 C), temperature source Oral, resp. rate 18, weight 92 kg (202 lb 13.2 oz), SpO2 100 %. Dg Chest Port 1 View  Result Date: 12/15/2017 CLINICAL DATA:  Status post dialysis catheter placement today. EXAM: PORTABLE CHEST 1 VIEW COMPARISON:  Single-view of the chest 12/09/2017. FINDINGS: Previously seen left IJ approach dialysis catheter has been removed and a new catheter is in place. No pneumothorax is identified. Tip of the new catheter projects in the right atrium. There is left basilar airspace disease and a small effusion. Cardiomegaly is noted. No pulmonary edema. Aortic atherosclerosis is identified. Feeding tube is unchanged. IMPRESSION: Tip dialysis of new left catheter IJ approach projects in the right atrium. No pneumothorax. Small left effusion and left basilar airspace disease, likely atelectasis. Cardiomegaly without edema. Atherosclerosis. Electronically Signed   By: Inge Rise M.D.   On: 12/15/2017 13:24   Dg Fluoro Guide Cv Line-no Report  Result Date: 12/15/2017 Fluoroscopy was utilized by the requesting physician.  No radiographic interpretation.   Recent Labs    12/14/17 1939 12/16/17 1213  WBC 7.8 7.0  HGB 7.5* 7.8*  HCT 22.4* 23.9*  PLT 194 199   Recent Labs    12/15/17 0603 12/16/17 1213  NA 131* 131*  K 4.6 4.0  CL 92* 92*  GLUCOSE 126* 127*  BUN 92* 48*  CREATININE 8.01* 5.61*  CALCIUM 7.7* 7.4*   CBG (last 3)  Recent Labs    12/16/17 0405 12/16/17 0723 12/16/17 1557  GLUCAP 123* 169* 98    Wt Readings from Last 3 Encounters:  12/17/17  92 kg (202 lb 13.2 oz)  12/16/17 91.5 kg (201 lb 11.5 oz)  12/04/17 91.3 kg (201 lb 4.5 oz)    Physical Exam:  Constitutional. Patient is alert.ND  HEENT. Craniotomy site clean and dry with staples Eyes. PERRL Neck: Normal range of motion. Neck supple. No thyromegaly present.  Cardiovascular: RRR without murmur. No JVD  Respiratory: CTA Bilaterally without wheezes or rales. Normal effort  GI: BS +, non-tender, non-distended  Skin. Left artery graft site with some increased swelling, bruising along posterior arm Neurological: He is alert and oriented to person, place, and time. Speech clear, strong voice and cough. Fair insight and awareness  Motor: RUE: 5/5 proximal to distal LUE: 3/5 proximal to distal RLE: 4+ to 5/5 proximal to distal LLE: HF 2-/5, KE 2/5, ADF 3-4/5  Decreased LT in both feet Psych: generally pleasant and cooperative     Assessment/Plan: 1. Functional deficits and left HP secondary to right SDH which require 3+ hours per day of interdisciplinary therapy in a comprehensive inpatient rehab setting. Physiatrist is providing close team supervision and 24 hour management of active medical problems listed below. Physiatrist and rehab team continue to assess barriers to discharge/monitor patient progress toward functional and medical goals.  Function:  Bathing Bathing position      Bathing parts      Bathing assist        Upper Body Dressing/Undressing Upper body dressing  Upper body assist        Lower Body Dressing/Undressing Lower body dressing                                  Lower body assist        Toileting Toileting          Toileting assist     Transfers Chair/bed transfer             Locomotion Ambulation           Wheelchair          Cognition Comprehension    Expression    Social Interaction    Problem Solving    Memory     Medical Problem List and Plan:  1. Decreased  functional mobility with left-sided weakness and dysphagia secondary to traumatic right subdural hematoma status post right frontotemporal parietal craniotomy 12/05/2017  -beginning therapies.  2. DVT Prophylaxis/Anticoagulation: SCDs. Monitor for any signs of DVT  3. Pain Management: Oxycodone as needed  4. Mood: Seroquel 25 mg twice a day  5. Neuropsych: This patient is capable of making decisions on his own behalf.  6. Skin/Wound Care: Routine skin checks  7. Fluids/Electrolytes/Nutrition: Routine I&O's with follow-up chemistries  -encourage PO   -reviewed the fact that until his diet is upgraded that he needs to make sure he takes in enough protein -doesn't want protein supps. -will ask RD to see -hopefully we'll be able to upgrade his diet soon 8. End-stage renal disease. Continue hemodialysis as per renal services. Status post arteriovenous Gore-Tex graft left arm 12/15/2017  -HD scheduled after therapies to maximize therapy time/performance  9. Diabetes mellitus and peripheral neuropathy. Latest hemoglobin A1c 6.4. SSI. Check blood sugars before meals and at bedtime    -fair control thus far 10. Acute on chronic anemia. Continue Aranesp. hgb 7.8 3/8 11. 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 Monitor for any signs of fluid overload     - Filed Weights   12/17/17 0115  Weight: 92 kg (202 lb 13.2 oz)    12. Hypertension. Monitor with increased mobility  13. MRSA PCR screening positive. Contact precautions  14. Dysphagia: SLP to see today. He looks good on exam. Hopeful that we'll be able to advance him soon   LOS (Days) 1 A Lakeline T, MD 12/17/2017 8:59 AM

## 2017-12-17 NOTE — Progress Notes (Addendum)
Contacted re: results of dopplers. Right femoral, posterior tibial vein as well left peroneal vein DVT's seen. Given age and large SDH will pursue IVCF placement  Contact IR. Place patient on bedrest.  [name], MD, [place] 12/17/2017

## 2017-12-17 NOTE — IPOC Note (Signed)
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 (Albert Stanley)     Functional Problem List: Nursing Behavior, Bladder, Bowel, Edema, Endurance, Medication Management, Motor, Nutrition, Pain, Perception, Safety, Skin Integrity  PT Balance, Safety, Behavior, Sensory, Edema, Skin Integrity, Endurance, Motor, Pain, Perception  OT Balance, Skin Integrity, Cognition, Safety, Perception, Edema, Endurance, Vision, Motor, Pain  SLP Cognition, Nutrition  TR         Basic ADL's: OT Eating, Grooming, Bathing, Dressing, Toileting     Advanced  ADL's: OT       Transfers: PT Bed Mobility, Bed to Chair, Car, Manufacturing systems engineer, Metallurgist: PT Ambulation, Emergency planning/management officer, Stairs     Additional Impairments: OT Fuctional Use of Upper Extremity  SLP Social Cognition, Swallowing   Problem Solving, Memory, Attention, Awareness  TR      Anticipated Outcomes Item Anticipated Outcome  Self Feeding supervision  Swallowing  mod I    Basic self-care  supervision to min A   Toileting  min A    Bathroom Transfers supervision   Bowel/Bladder  min assist  Transfers  supervision with LRAD  Locomotion  min assist with LRAD  Communication     Cognition  supervision   Pain  less<3  Safety/Judgment  min assist   Therapy Plan: PT Intensity: Minimum of 1-2 x/day ,45 to 90 minutes PT Frequency: 5 out of 7 days PT Duration Estimated Length of Stay: 3-4 weeks OT Intensity: Minimum of 1-2 x/day, 45 to 90 minutes OT Frequency: 5 out of 7 days OT Duration/Estimated  Length of Stay: ~21-24 days SLP Intensity: Minumum of 1-2 x/day, 30 to 90 minutes SLP Frequency: 3 to 5 out of 7 days    Team Interventions: Nursing Interventions Patient/Family Education, Pain Management, Dysphagia/Aspiration Precaution Training, Bladder Management, Medication Management, Discharge Planning, Skin Care/Wound Management, Bowel Management, Psychosocial Support, Disease Management/Prevention  PT interventions Ambulation/gait training, Community reintegration, DME/adaptive equipment instruction, Neuromuscular re-education, Psychosocial support, Stair training, UE/LE Strength taining/ROM, Wheelchair propulsion/positioning, Training and development officer, Discharge planning, Pain management, Skin care/wound management, Therapeutic Activities, UE/LE Coordination activities, Visual/perceptual remediation/compensation, Therapeutic Exercise, Splinting/orthotics, Patient/family education, Functional mobility training, Disease management/prevention, Cognitive remediation/compensation  OT Interventions Balance/vestibular training, Discharge planning, Functional electrical stimulation, Pain management, Self Care/advanced ADL retraining, Therapeutic Activities, UE/LE Coordination activities, Cognitive remediation/compensation, Disease mangement/prevention, Functional mobility training, Patient/family education, Therapeutic Exercise, Visual/perceptual remediation/compensation, Academic librarian, Engineer, drilling, Neuromuscular re-education, Psychosocial support, UE/LE Strength taining/ROM, Wheelchair propulsion/positioning  SLP Interventions Cognitive remediation/compensation, English as a second language teacher, Dysphagia/aspiration precaution training, Patient/family education, Environmental controls, Functional tasks  TR Interventions    SW/CM Interventions Discharge Planning, Psychosocial Support, Patient/Family Education   Barriers to Discharge MD  Medical stability  Nursing      PT Decreased  caregiver support(unsure if pt's wife & daughter can provide 24 hr assist/supervision upon d/c)    OT      SLP Decreased caregiver support 21-24 days   SW       Team Discharge Planning: Destination: PT-  ,OT- Home , SLP-Home Projected Follow-up: PT-(HHPT & 24 hr assist vs SNF), OT-  Home health OT, SLP-Home Health SLP, Outpatient SLP, 24 hour  supervision/assistance Projected Equipment Needs: PT-To be determined, OT- To be determined, SLP-None recommended by SLP Equipment Details: PT- , OT-  Patient/family involved in discharge planning: PT- Patient,  OT-Patient, SLP-Patient  MD ELOS: 20-25 days Medical Rehab Prognosis:  Excellent Assessment: The patient has been admitted for CIR therapies with the diagnosis of left SDH. The team will be addressing functional mobility, strength, stamina, balance, safety, adaptive techniques and equipment, self-care, bowel and bladder mgt, patient and caregiver education, NMR, cognition, communication, swallowing, community rentry, pain control. Goals have been set at supervision to min assist with self-care, ADL's, mobility, and locomotion. Supervision to min assist with cognition/safety.    Albert Staggers, MD, FAAPMR      See Team Conference Notes for weekly updates to the plan of care

## 2017-12-17 NOTE — Progress Notes (Signed)
Bilateral lower extremity venous duplex has been completed. There is evidence of age indeterminate deep vein thrombosis involving the femoral, and posterior tibial veins of the right lower extremity. A cystic structure measuring 1.1 cm high by 2.9 cm wide by 4.7 cm long is found in the right popliteal fossa.  There is evidence of age indeterminate deep vein thrombosis involving the peroneal veins of the left lower extremity. Results were given to the patient's nurse, Pryor Montes.  12/17/17 3:51 PM Carlos Levering RVT

## 2017-12-17 NOTE — Progress Notes (Signed)
Pt refused Renvela dinner dose. RN Pryor Montes notified.

## 2017-12-17 NOTE — Progress Notes (Signed)
Vascular study shows DVT right femoral,and posterior tibial veins RLE.Also with age indeterminate DVT peroneal veins LLE .  Cannot anticoagulate secondary SDH  Consult IR for filter placement

## 2017-12-17 NOTE — H&P (Signed)
Chief Complaint: Bilateral lower extremity DVTs  Referring Physician(s): Ovando, Lavon Paganini  Supervising Physician: Arne Cleveland  Patient Status: [facility] - In-pt  History of Present Illness: Albert Stanley is a 72 y.o. male with history of bladder cancer with resection in 1999, lymphedema, end-stage renal disease with hemodialysis, diastolic congestive heart failure, diabetes mellitus, hypertension, nonischemic cardiomyopathy.   He presented to the ED on 12/05/2017 with fatigue, vomiting and diarrhea 2 days as well as headache.  He had missed his last dialysis session.   He reportedly fell on 12/03/2017 when he tripped on the carpet.   He denied striking his head, however cranial CT showed a large right side subdural hematoma measuring 16 mm in width with significant mass effect and a 13 mm right to left midline shift.   Mass effect effaces the sulci and right lateral ventricle. Uncal herniation present.   Neurosurgery consulted underwent right frontotemporal parietal craniotomy hematoma evacuation 12/05/2017 per Dr. Cyndy Freeze.   Venous doppler done this afternoon showed age indeterminate deep vein thrombosis involving the femoral, and posterior tibial veins of the right lower extremity. A cystic structure measuring 1.1 cm high by 2.9 cm wide by 4.7 cm long is found in the right popliteal fossa. There is evidence of age indeterminate deep vein thrombosis involving the peroneal veins of the left lower extremity.  We are asked to place an IVC filter.    Past Medical History:  Diagnosis Date  . Arthritis    Osteoarthritis  . Asthma   . Bladder cancer ([place]) dx'd 1990   surg only  . CHF (congestive heart failure) ([place])   . Diabetes mellitus without complication ([place])   . ESRD (end stage renal disease) ([name])    Tues, Thurs, Sat dialysis  . GERD (gastroesophageal reflux disease)   . History of cardiac catheterization    a. LHC 5/17: no obstructive CAD  . History of DVT  (deep vein thrombosis)    2003 ish  . History of nuclear stress test    a. Myoview 4/17: EF 39%, inf, inf-lat, apical inf, apical lat, apical scar, intermediate risk  . Hyperlipidemia   . Hypertension   . NICM (nonischemic cardiomyopathy) ([place])    a. Echo 2/17: mod LVH, EF 35-40%, inf-lat and inf-sept HK, mod MR, severe LAE, small pericardial effusion    Past Surgical History:  Procedure Laterality Date  . arthroscopic knee surgery Left   . AV FISTULA PLACEMENT Left 12/13/2015   Procedure: ARTERIOVENOUS (AV) FISTULA CREATION;  Surgeon: Angelia Mould, MD;  [name];  Service: Vascular;  Laterality: Left;  . AV FISTULA PLACEMENT Left 12/15/2017   Procedure: INSERTION OF ARTERIOVENOUS (AV) GORE-TEX GRAFT ARM;  Surgeon: Serafina Mitchell, MD;  Location: [place];  Service: Vascular;  Laterality: Left;  . BACK SURGERY     2 times  1960  . CARDIAC CATHETERIZATION N/A 02/28/2016   Procedure: Left Heart Cath and Coronary Angiography;  Surgeon: Larey Dresser, MD;  Location: [place] CV LAB;  Service: Cardiovascular;  Laterality: N/A;  . CRANIOTOMY N/A 12/05/2017   Procedure: CRANIOTOMY HEMATOMA EVACUATION SUBDURAL;  Surgeon: Ashok Pall, MD;  Location: [place];  Service: Neurosurgery;  Laterality: N/A;  . CYSTOSCOPY    . FISTULA SUPERFICIALIZATION Left 08/14/2016   Procedure: FISTULA SUPERFICIALIZATION LEFT UPPER ARM;  Surgeon: Angelia Mould, MD;  Location: [place];  Service: Vascular;  Laterality: Left;  . INSERTION OF DIALYSIS CATHETER N/A 12/13/2015   Procedure: INSERTION OF DIALYSIS CATHETER;  Surgeon: Angelia Mould, MD;  Location: [place];  Service: Vascular;  Laterality: N/A;  . INSERTION OF DIALYSIS CATHETER Left 05/07/2017   Procedure: INSERTION OF DIALYSIS CATHETER LEFT INTERNAL JUGULAR PLACEMENT;  Surgeon: Angelia Mould, MD;  Location: [place];  Service: Vascular;  Laterality: Left;  . INSERTION OF DIALYSIS CATHETER Left 12/15/2017   Procedure: INSERTION OF  DIALYSIS CATHETER;  Surgeon: Serafina Mitchell, MD;  Location: [facility] OR;  Service: Vascular;  Laterality: Left;  . IR RADIOLOGIST EVAL & MGMT  05/12/2017  . JOINT REPLACEMENT Left    hip  . LIGATION OF COMPETING BRANCHES OF ARTERIOVENOUS FISTULA Left 08/14/2016   Procedure: LIGATION OF COMPETING BRANCHES OF ARTERIOVENOUS FISTULA LEFT UPPER ARM;  Surgeon: Angelia Mould, MD;  Location: [place];  Service: Vascular;  Laterality: Left;  . PERIPHERAL VASCULAR CATHETERIZATION Left 06/09/2016   Procedure: Fistulagram;  Surgeon: Serafina Mitchell, MD;  Location: [place] [facility];  Service: Cardiovascular;  Laterality: Left;  ARM  . PERIPHERAL VASCULAR CATHETERIZATION Left 06/09/2016   Procedure: Peripheral Vascular Balloon Angioplasty;  Surgeon: Serafina Mitchell, MD;  Location: [place] [facility];  Service: Cardiovascular;  Laterality: Left;  upper arm venous  . PERIPHERAL VASCULAR CATHETERIZATION Left 06/25/2016   Procedure: Fistulagram;  Surgeon: Serafina Mitchell, MD;  Location: [place] [facility];  Service: Cardiovascular;  Laterality: Left;  . REVISON OF ARTERIOVENOUS FISTULA Left 05/07/2017   Procedure: REPAIR OF PSEUDOANEURYSM  LEFT BRACHIO-CEPHALIC  ARM ARTERIOVENOUS FISTULA;  Surgeon: Angelia Mould, MD;  Location: [place];  Service: Vascular;  Laterality: Left;    Allergies: Meperidine; Penicillins; Sulfa antibiotics; Sulfonamide derivatives; Lac bovis; Lactose intolerance (gi); Milk-related compounds; Foltx [elfolate plus]; Betadine [povidone iodine]; and Eggs or egg-derived products  Medications: Prior to Admission medications   Medication Sig Start Date End Date Taking? Authorizing Provider  acetaminophen (TYLENOL) 500 MG tablet Take 1,000 mg by mouth every 6 (six) hours as needed for mild pain.    [provider]  albuterol (PROVENTIL HFA;VENTOLIN HFA) 108 (90 Base) MCG/ACT inhaler Inhale 1-2 puffs into the lungs every 6 (six) hours as needed for wheezing or shortness of breath.  12/03/16   Kinnie Feil, PA-C  allopurinol (ZYLOPRIM) 300 MG tablet Take 300 mg by mouth daily.     [provider]  amiodarone (PACERONE) 200 MG tablet Take 1 tablet (200 mg total) by mouth daily. 09/24/17   Larey Dresser, MD  calcitRIOL (ROCALTROL) 0.5 MCG capsule Take 0.5 mcg by mouth Every Tuesday,Thursday,and Saturday with dialysis. Medication given at dialysis on Tues Thurs Sat 11/13/15   [provider]  carvedilol (COREG) 3.125 MG tablet Take 1 tablet (3.125 mg total) by mouth See admin instructions. Take 1 tablet (3.125 mg) by mouth twice daily on Monday, Wednesday, Friday and Sunday (non-dialysis days) Patient not taking: Reported on 12/04/2017 08/14/16   Alvia Grove, PA-C  diphenhydrAMINE (BENADRYL) 25 MG tablet Take 25 mg by mouth 2 (two) times daily as needed for itching or allergies.     [provider]  famotidine (PEPCID) 20 MG tablet Take 1 tablet (20 mg total) by mouth 2 (two) times daily as needed (sour stomach). Patient not taking: Reported on 12/04/2017 08/14/16   Virgina Jock A, PA-C  glipiZIDE (GLUCOTROL) 5 MG tablet Take 0.5 tablets (2.5 mg total) by mouth 2 (two) times daily. Patient taking differently: Take 2.5 mg by mouth See admin instructions. Take as needed...one tablet in the morning if BS is  greater than 80. 08/14/16   Trinh, Kimberly A, PA-C  Menthol, Topical Analgesic, (ICY HOT EX) Apply 1 application topically 3 (three) times daily as needed (pain).    [provider]  multivitamin (RENA-VIT) TABS tablet Take 1 tablet by mouth at bedtime.  12/20/15   [provider]  oxyCODONE-acetaminophen (PERCOCET) 7.5-325 MG tablet Take 1 tablet by mouth 3 (three) times daily as needed (pain). Patient taking differently: Take 1 tablet by mouth 4 (four) times daily as needed (pain).  08/14/16   Alvia Grove, PA-C  sevelamer carbonate (RENVELA) 800 MG tablet Take 2 tablets (1,600 mg total) by mouth 3 (three) times daily with  meals. Patient taking differently: Take 2,400 mg by mouth See admin instructions. Take 3 tablets (2400 mg) by mouth daily with meals - usually twice daily 12/18/15   Geradine Girt, DO     Family History  Problem Relation Age of Onset  . Arthritis Mother   . Cancer Father   . Heart disease Father   . Hypertension Sister   . Alcohol abuse Brother   . Diabetes Daughter   . Heart attack Paternal Uncle     Social History   Socioeconomic History  . Marital status: Married    Spouse name: None  . Number of children: None  . Years of education: None  . Highest education level: None  Social Needs  . Financial resource strain: None  . Food insecurity - worry: None  . Food insecurity - inability: None  . Transportation needs - medical: None  . Transportation needs - non-medical: None  Occupational History  . Occupation: Retired  Tobacco Use  . Smoking status: Former Smoker    Years: 30.00    Last attempt to quit: 10/12/1998    Years since quitting: 19.1  . Smokeless tobacco: Never Used  Substance and Sexual Activity  . Alcohol use: No    Alcohol/week: 0.0 oz    Comment: Used to drink on the weekends, quit 2 years ago  . Drug use: No  . Sexual activity: No  Other Topics Concern  . None  Social History Narrative   Lives with wife and daughter.    Review of Systems: A 12 point ROS discussed and pertinent positives are indicated in the HPI above.  All other systems are negative. Review of Systems Vital Signs: BP 132/64 (BP Location: Right Arm)   Pulse 83   Temp 98.7 F (37.1 C) (Oral)   Resp 18   Wt 202 lb 13.2 oz (92 kg)   SpO2 98%   BMI 29.95 kg/m   Physical Exam  Constitutional: He is oriented to person, place, and time. He appears well-developed.  HENT:  Head:    Recent cranial surgery, staples in place  Eyes: EOM are normal.  Neck: Normal range of motion.  Cardiovascular: Normal rate and regular rhythm.  Pulmonary/Chest: Effort normal. No respiratory  distress.  Neurological: He is alert and oriented to person, place, and time.  Skin: Skin is warm and dry.  Psychiatric: He has a normal mood and affect. His behavior is normal. Judgment and thought content normal.  Vitals reviewed.   Imaging: Dg Chest 2 View  Result Date: 12/02/2017 CLINICAL DATA:  Worsening swelling of the legs. EXAM: CHEST  2 VIEW COMPARISON:  05/07/2017 FINDINGS: Chronic cardiomegaly and aortic atherosclerosis. The lungs are clear except for chronic markings. No sign of interstitial or alveolar edema. No effusion. No acute bone finding. IMPRESSION: Chronic cardiomegaly. Chronic lung  markings. No edema or effusions. Electronically Signed   By: Nelson Chimes M.D.   On: 12/02/2017 14:56   Dg Lumbar Spine Complete  Result Date: 12/04/2017 CLINICAL DATA:  Fall yesterday.  Low back pain. EXAM: LUMBAR SPINE - COMPLETE 4+ VIEW COMPARISON:  CT abdomen and pelvis 07/03/2017 FINDINGS: Five non rib-bearing lumbar type vertebral bodies are present. Multilevel degenerative changes are again seen within the lumbar spine. Endplate marrow changes at L2-3 and L4-5 are stable. Multilevel facet degenerative changes are noted. No acute fracture traumatic subluxation is present. Atherosclerotic calcifications are present in the aortic arch without aneurysm. Left hip replacement is noted. IMPRESSION: 1. Stable multilevel degenerative changes in the lumbar spine. 2. No acute abnormality. Electronically Signed   By: San Morelle M.D.   On: 12/04/2017 15:15   Dg Pelvis 1-2 Views  Result Date: 12/04/2017 CLINICAL DATA:  [place] yesterday.  Pelvic pain. EXAM: PELVIS - 1-2 VIEW COMPARISON:  CT 07/03/2017 FINDINGS: No evidence of acute fracture. Previous total hip replacement on the left. Lucency in the periacetabular bone and proximal femur suggests the possibility of loosening or particle disease. IMPRESSION: No acute or traumatic finding. Lucency in the periacetabular bone and proximal femur suggest  the possibility of loosening or particle disease. Electronically Signed   By: Nelson Chimes M.D.   On: 12/04/2017 15:16   Dg Sacrum/coccyx  Result Date: 12/04/2017 CLINICAL DATA:  Fall yesterday. EXAM: SACRUM AND COCCYX - 2+ VIEW COMPARISON:  None. FINDINGS: There is an unusual configuration to the sacrum on the lateral view. A fracture is not excluded. CT imaging could better evaluate. Degenerative changes are seen in the lower lumbar spine. No other acute abnormalities. IMPRESSION: An unusual configuration to the sacrum on the lateral view is identified. A fracture is not excluded. CT imaging could better evaluate. Electronically Signed   By: Dorise Bullion III M.D   On: 12/04/2017 15:17   Dg Chest Port 1 View  Result Date: 12/15/2017 CLINICAL DATA:  Status post dialysis catheter placement today. EXAM: PORTABLE CHEST 1 VIEW COMPARISON:  Single-view of the chest 12/09/2017. FINDINGS: Previously seen left IJ approach dialysis catheter has been removed and a new catheter is in place. No pneumothorax is identified. Tip of the new catheter projects in the right atrium. There is left basilar airspace disease and a small effusion. Cardiomegaly is noted. No pulmonary edema. Aortic atherosclerosis is identified. Feeding tube is unchanged. IMPRESSION: Tip dialysis of new left catheter IJ approach projects in the right atrium. No pneumothorax. Small left effusion and left basilar airspace disease, likely atelectasis. Cardiomegaly without edema. Atherosclerosis. Electronically Signed   By: Inge Rise M.D.   On: 12/15/2017 13:24   Dg Chest Port 1 View  Result Date: 12/09/2017 CLINICAL DATA:  Status post craniotomy 4 days ago. EXAM: PORTABLE CHEST 1 VIEW COMPARISON:  Portable chest x-ray of December 08, 2017 FINDINGS: The trachea has been extubated. The lungs are reasonably well inflated. The interstitial markings are increased and slightly more conspicuous today. The cardiac silhouette remains enlarged. There  is a small left pleural effusion. There is likely left lower lobe atelectasis. The feeding tube tip projects below the inferior margin of the image. The dual-lumen left internal jugular venous catheter tip projects over the proximal SVC. The right internal jugular venous catheter tip also projects over the proximal portion of the SVC. IMPRESSION: Interval extubation of the trachea. Mild interstitial prominence bilaterally consistent with mild interstitial edema. Left lower lobe atelectasis and small left pleural effusion.  Electronically Signed   By: David  Martinique M.D.   On: 12/09/2017 07:57   Dg Chest Port 1 View  Result Date: 12/08/2017 CLINICAL DATA:  ETT/subdural hematoma EXAM: PORTABLE CHEST 1 VIEW COMPARISON:  12/07/2014 and multiple prior studies including 12/09/2016 FINDINGS: Endotracheal tube is in place, tip approximately 1.9 centimeters above the carina. LEFT IJ central line tip overlies the level of superior vena cava. A RIGHT-sided catheter courses towards the RIGHT hilar region, with course slightly more LATERAL than expected for IJ line. LATERAL view is recommended to confirm line placement. The heart is enlarged. There is dense opacity in the LEFT lung base, slightly increased compared to prior studies. RIGHT basilar atelectasis is present. No pneumothorax. IMPRESSION: 1. RIGHT-sided central venous catheter tip with atypical course. Intravascular coarse cannot be confirmed. 2. Recommend further evaluation with LATERAL chest x-ray if the patient is able. 3. These results were called by telephone at the time of interpretation on 12/08/2017 at 11:00 am to Dr. Elsworth Soho, who verbally acknowledged these results. Electronically Signed   By: Nolon Nations M.D.   On: 12/08/2017 11:01   Dg Chest Port 1 View  Result Date: 12/07/2017 CLINICAL DATA:  Status post placement of a new dialysis catheter. EXAM: PORTABLE CHEST 1 VIEW COMPARISON:  Portable chest x-ray of December 07, 2017 at 9:09 a.m. FINDINGS: The  patient has undergone placement of a dialysis catheter via the left subclavian approach. The tip of the catheter projects over the proximal SVC. No postprocedure pneumothorax is observed. The right internal jugular venous catheter is in stable position with its tip approximately 3 cm more distal in the mid SVC. The endotracheal tube tip projects 3.8 cm above the carina. The cardiac silhouette remains enlarged. The pulmonary vascularity is not engorged. There is no pleural effusion. There is calcification in the wall of the thoracic aorta. IMPRESSION: No postprocedure complication following placement of the dialysis catheter via the left subclavian approach. Electronically Signed   By: David  Martinique M.D.   On: 12/07/2017 13:23   Dg Chest Port 1 View  Result Date: 12/07/2017 CLINICAL DATA:  Status post right internal jugular venous catheter placement. EXAM: PORTABLE CHEST 1 VIEW COMPARISON:  Portable chest x-ray of earlier today. FINDINGS: The internal jugular venous catheter on the right has its tip projecting over the junction of the middle and distal thirds of the SVC. There is no postprocedure complication. The lungs are mildly hypoinflated. There is no focal infiltrate. The cardiac silhouette is enlarged. The pulmonary vascularity is not engorged. There is calcification in the wall of the aortic arch. The endotracheal tube tip projects 2.6 cm above the carina. IMPRESSION: No postprocedure complication following right internal jugular venous catheter placement. Electronically Signed   By: David  Martinique M.D.   On: 12/07/2017 09:40   Dg Chest Port 1 View  Result Date: 12/07/2017 CLINICAL DATA:  Intubated EXAM: PORTABLE CHEST 1 VIEW COMPARISON:  Chest radiograph from one day prior. FINDINGS: Endotracheal tube tip is 2.6 cm above the carina. Stable cardiomediastinal silhouette with mild cardiomegaly. No pneumothorax. No pleural effusion. Curvilinear bibasilar lung opacities are mildly increased. No pulmonary  edema. IMPRESSION: 1. Well-positioned endotracheal tube. 2. Stable cardiomegaly without pulmonary edema. 3. Mildly increased curvilinear bibasilar lung opacities, favor atelectasis. Electronically Signed   By: Ilona Sorrel M.D.   On: 12/07/2017 08:48   Dg Chest Port 1 View  Result Date: 12/06/2017 CLINICAL DATA:  Hypoxia EXAM: PORTABLE CHEST 1 VIEW COMPARISON:  December 05, 2017 FINDINGS: Endotracheal tube tip  is 1.5 cm above the carina. Nasogastric tube has been removed. No pneumothorax. There is left base atelectasis. There is no edema or consolidation. There is cardiomegaly with pulmonary vascularity within normal limits. No adenopathy. There is aortic atherosclerosis. No evident bone lesions. IMPRESSION: Endotracheal tube as described without pneumothorax. Left base atelectasis. Stable cardiomegaly. There is aortic atherosclerosis. Aortic Atherosclerosis (ICD10-I70.0). Electronically Signed   By: Lowella Grip III M.D.   On: 12/06/2017 07:20   Dg Chest Portable 1 View  Result Date: 12/05/2017 CLINICAL DATA:  Responded to code stroke. Anisocoria and fall was the reason for code stroke.tube placement EXAM: PORTABLE CHEST 1 VIEW COMPARISON:  12/02/2017 FINDINGS: Endotracheal tube tip projects 1.6 cm above the carina. There is a nasal/orogastric tube. This curls within the mid esophagus. Tip is directed back superiorly above the included field of view. Cardiac silhouette is enlarged.  No acute findings in the lungs. IMPRESSION: 1. Endotracheal tube is well positioned. 2. Well-positioned nasal/orogastric tube curling in the mid esophagus. Tip projects above the included field of view, within the neck. 3. Stable cardiomegaly.  No acute findings in the lungs. Electronically Signed   By: Lajean Manes M.D.   On: 12/05/2017 16:41   Dg Fluoro Guide Cv Line-no Report  Result Date: 12/15/2017 Fluoroscopy was utilized by the requesting physician.  No radiographic interpretation.   Ct Head Code Stroke Wo  Contrast  Result Date: 12/05/2017 CLINICAL DATA:  Code stroke. Focal neuro deficit, less than 6 hours, stroke suspected. Altered mental status. Asymmetric pupils. Fall. EXAM: CT HEAD WITHOUT CONTRAST TECHNIQUE: Contiguous axial images were obtained from the base of the skull through the vertex without intravenous contrast. COMPARISON:  CT head without contrast 07/03/2017. FINDINGS: Brain: A large right-sided subdural hematoma extends over the convexity measuring 16 mm. There is a focal area of expansion over the high right parietal lobe. This creates significant mass effect with effacement of the sulci and much of the right lateral ventricle. 13 mm midline shift is evident. The atrium of the left lateral ventricle and temporal tip are mildly expanded. There is uncal herniation. Hemorrhage extends over the right temporal tip. No intraventricular hemorrhage is evident. No significant hemorrhage is present on the left. Basal ganglia are intact.  No cortical infarct is evident. Vascular: Vascular calcifications are present in the cavernous internal carotid arteries bilaterally. There is no hyperdense vessel. Skull: Calvarium is intact. No focal lytic or blastic lesions are present. Sinuses/Orbits: Mild mucosal thickening present along the floor of the left maxillary sinus. Remaining paranasal sinuses and mastoid air cells are clear. Globes and orbits are within normal limits. ASPECTS [facility] Stroke Program Early CT Score) - Ganglionic level infarction (caudate, lentiform nuclei, internal capsule, insula, M1-M3 cortex): 7/7 - Supraganglionic infarction (M4-M6 cortex): 3/3 Total score (0-10 with 10 being normal): 10/10 IMPRESSION: 1. Large right subdural hematoma measuring up to 16 mm in width with significant mass effect and 13 mm of right-to-left midline shift. 2. Mass effect effaces the sulci and right lateral ventricle. 3. Uncal herniation is present. 4. Mild prominence of the atrium of the left lateral ventricle  in left temporal tip may reflect early evidence of a trapped ventricle. 5. ASPECTS is 10/10 Critical Value/emergent results were called by telephone at the time of interpretation on 12/05/2017 at 3:57 pm to Dr. Rory Percy, who verbally acknowledged these results. Electronically Signed   By: San Morelle M.D.   On: 12/05/2017 16:01    Labs:  CBC: Recent Labs    12/10/17  7322 12/11/17 0352 12/14/17 1939 12/16/17 1213  WBC 11.2* 8.2 7.8 7.0  HGB 7.4* 7.4* 7.5* 7.8*  HCT 22.0* 22.4* 22.4* 23.9*  PLT 170 154 194 199    COAGS: Recent Labs    12/05/17 1626 12/07/17 0516 12/08/17 0334 12/09/17 0430 12/10/17 0517 12/11/17 0352  INR 1.38 1.59 1.69 1.72  --   --   APTT 32 34 35 37* 36 37*    BMP: Recent Labs    12/12/17 0931 12/14/17 1939 12/15/17 0603 12/16/17 1213  NA 134* 131* 131* 131*  K 3.6 4.2 4.6 4.0  CL 95* 90* 92* 92*  CO2 25 26 23 25   GLUCOSE 97 87 126* 127*  BUN 45* 84* 92* 48*  CALCIUM 8.3* 7.9* 7.7* 7.4*  CREATININE 4.79* 7.49* 8.01* 5.61*  GFRNONAA 11* 6* 6* 9*  GFRAA 13* 7* 7* 11*    LIVER FUNCTION TESTS: Recent Labs    12/08/17 0334 12/09/17 0430 12/10/17 0517 12/11/17 0352 12/12/17 0931 12/14/17 1939 12/16/17 1213  BILITOT 1.2 1.2 1.8* 1.6*  --   --   --   AST 615* 292* 337* 265*  --   --   --   ALT 587* 504* 503* 488*  --   --   --   ALKPHOS 49 88 120 140*  --   --   --   PROT 4.9* 5.3* 5.4* 6.0*  --   --   --   ALBUMIN 2.4* 2.5* 2.5* 2.8* 2.6* 2.4* 2.4*    TUMOR MARKERS: No results for input(s): AFPTM, CEA, CA199, CHROMGRNA in the last 8760 hours.  Assessment and Plan:  Age indeterminate deep vein thrombosis involving the femoral, and posterior tibial veins of the right lower extremity and age indeterminate deep vein thrombosis involving the peroneal veins of the left lower extremity.  Recent cranial surgery for SDH  Unable to anticoagulate  Will proceed with placement of inferior vena cava filter tomorrow.  Risks and  benefits discussed with the patient including, but not limited to bleeding, infection, contrast induced renal failure, filter fracture or migration which can lead to emergency surgery or even death, strut penetration with damage or irritation to adjacent structures and caval thrombosis.  All of the patient's questions were answered, patient is agreeable to proceed. Consent signed and in chart.  Thank you for this interesting consult.  I greatly enjoyed meeting STEPHANIE MCGLONE and look forward to participating in their care.  A copy of this report was sent to the requesting provider on this date.  Electronically Signed: Murrell Redden, PA-C 12/17/2017, 4:10 PM   I spent a total of 20 Minutes in face to face in clinical consultation, greater than 50% of which was counseling/coordinating care for IVC filter.

## 2017-12-17 NOTE — Care Management Note (Signed)
Inpatient [facility] Individual Statement of Services  Patient Name:  Albert Stanley  Date:  12/17/2017  Welcome to the [facility].  Our goal is to provide you with an individualized program based on your diagnosis and situation, designed to meet your specific needs.  With this comprehensive rehabilitation program, you will be expected to participate in at least 3 hours of rehabilitation therapies Monday-Friday, with modified therapy programming on the weekends.  Your rehabilitation program will include the following services:  Physical Therapy (PT), Occupational Therapy (OT), Speech Therapy (ST), 24 hour per day rehabilitation nursing, Therapeutic Recreaction (TR), Neuropsychology, Case Management (Social Worker), Rehabilitation Medicine, Nutrition Services and Pharmacy Services  Weekly team conferences will be held on Tuesdays to discuss your progress.  Your Social Worker will talk with you frequently to get your input and to update you on team discussions.  Team conferences with you and your family in attendance may also be held.  Expected length of stay: 21-24 days    Overall anticipated outcome: minimal assistance  Depending on your progress and recovery, your program may change. Your Social Worker will coordinate services and will keep you informed of any changes. Your Social Worker's name and contact numbers are listed  below.  The following services may also be recommended but are not provided by the [facility] will be made to provide these services after discharge if needed.  Arrangements include referral to agencies that provide these services.  Your insurance has been verified to be:  [facility] Medicare Your primary doctor is:  [facility]  Pertinent information will be shared with your doctor and your insurance  company.  Social Worker:  [place], [place] or ([phone]   Information discussed with and copy given to patient by: Lennart Pall, 12/17/2017, 3:04 PM

## 2017-12-17 NOTE — Progress Notes (Signed)
Initial Nutrition Assessment  DOCUMENTATION CODES:   Not applicable  INTERVENTION:  Continue 30 ml Prostat po BID (mix prostat into cranberry juice), each supplement provides 100 kcal and 15 grams of protein.   Provide nourishment snacks. RD to order.   Encourage adequate PO intake.   High protein diet education discussed.   NUTRITION DIAGNOSIS:   Increased nutrient needs related to chronic illness(ESRD on HD) as evidenced by estimated needs.  GOAL:   Patient will meet greater than or equal to 90% of their needs  MONITOR:   PO intake, Supplement acceptance, Weight trends, Labs, Skin, I & O's  REASON FOR ASSESSMENT:   Consult Diet education  ASSESSMENT:   72 y.o. right handed male with history of bladder cancer with resection in 1999 lymphedema, end-stage renal disease with hemodialysis, diastolic congestive heart failure, diabetes mellitus, hypertension, nonischemic cardiomyopathy.  Presented 12/05/2017 with fatigue, vomiting and diarrhea 2 days as well as headache. Cranial CT reviewed showing right SDH. Neurosurgery consulted underwent right frontotemporal parietal craniotomy hematoma evacuation 12/05/2017.   NGT removed 3/6. Noted tube feeding orders still in place. RD to discontinue as pt no longer on tube feeding. Diet has just been advanced to a renal/carbohydrate modified diet with 1200 ml fluid restriction. Pt with 25% at lunch today. RD consulted for diet education on high protein. Discussed high protein containing foods that is appropriate for the renal diet. Additionally discussed the need for pt to consume high protein containing foods for healing and as related to chronic illnesses. Pt reports understanding of information. RD offered nutritional supplements. Pt does he does not tolerate milk products. RD offered Boost Breeze however pt refused them reporting they are "too sweet'". Pt currently has Prostat ordered. Pt does say he like cranberry juice. Recommend mixing  prostat into cranberry juice for patient. Pt does report eating well PTA with usual consuming of at least 2-3 meals a day with snacks in between. Pt reports favoring sandwiches. RD to order nourishment snacks (sandwiches) between meals to aid in increase caloric and protein needs.   Labs and medications reviewed. Phosphorous elevated at 5.4.  NUTRITION - FOCUSED PHYSICAL EXAM:    Most Recent Value  Orbital Region  No depletion  Upper Arm Region  No depletion  Thoracic and Lumbar Region  No depletion  Buccal Region  No depletion  Temple Region  No depletion  Clavicle Bone Region  No depletion  Clavicle and Acromion Bone Region  Mild depletion  Scapular Bone Region  Unable to assess  Dorsal Hand  No depletion  Patellar Region  No depletion  Anterior Thigh Region  No depletion  Posterior Calf Region  No depletion  Edema (RD Assessment)  Moderate  Hair  Reviewed  Eyes  Reviewed  Mouth  Reviewed  Skin  Reviewed  Nails  Reviewed      Diet Order:  Diet renal/carb modified with fluid restriction Diet-HS Snack? Nothing; Fluid restriction: 1200 mL Fluid; Room service appropriate? Yes; Fluid consistency: Thin  EDUCATION NEEDS:   Education needs have been addressed  Skin:  Skin Assessment: Skin Integrity Issues: Skin Integrity Issues:: Incisions Incisions: head, L arm  Last BM:  3/1  Height:   Ht Readings from Last 1 Encounters:  12/05/17 5\' 9"  (1.753 m)    Weight:   Wt Readings from Last 1 Encounters:  12/17/17 202 lb 13.2 oz (92 kg)    Ideal Body Weight:  72.7 kg  BMI:  Body mass index is 29.95 kg/m.  Estimated Nutritional  Needs:   Kcal:  2100-2300  Protein:  110-120 grams  Fluid:  1.2 L/day    Corrin Parker, MS, RD, LDN Pager # [phone] After hours/ weekend pager # [phone]

## 2017-12-17 NOTE — Evaluation (Addendum)
Occupational Therapy Assessment and Plan  Patient Details  Name: Albert Stanley MRN: [id number] Date of Birth: Dec 29, 1945  OT Diagnosis: acute pain, cognitive deficits and hemiplegia affecting non-dominant side Rehab Potential: Rehab Potential (ACUTE ONLY): Good ELOS: ~21-24 days   Today's Date: 12/17/2017 OT Individual Time: 6283-6629 OT Individual Time Calculation (min): 75 min     Problem List:  Patient Active Problem List   Diagnosis Date Noted  . Traumatic subdural hematoma ([name]) 12/16/2017  . ESRD (end stage renal disease) ([place])   . ESRD (end stage renal disease) on dialysis ([place])   . Dysphagia   . Acute blood loss anemia   . Anemia of chronic disease   . Chronic diastolic congestive heart failure ([place])   . Diabetes mellitus type 2 in nonobese ([name])   . Benign essential HTN   . Nonischemic cardiomyopathy ([place])   . Transaminitis   . Encounter for central line placement   . Endotracheally intubated   . SDH (subdural hematoma) ([place])   . Ventilator dependent ([place])   . Chronic renal impairment   . Chronic in-center hemodialysis status ([place])   . S/P craniotomy 12/05/2017  . Hyperkalemia 12/02/2017  . Malnutrition of moderate degree 12/18/2016  . PVC's (premature ventricular contractions)   . Anemia associated with chronic renal failure   . End-stage renal disease on hemodialysis ([place]) 12/09/2016  . Dyslipidemia associated with type 2 diabetes mellitus ([place]) 12/09/2016  . Acute on chronic systolic and diastolic heart failure, NYHA class 1 ([place]) 12/09/2016  . Diabetes mellitus with diabetic nephropathy without long-term current use of insulin ([place]) 12/09/2016  . Type 2 diabetes mellitus ([place]) 12/11/2015  . Fluid overload 12/06/2015  . History of DVT (deep vein thrombosis) 12/04/2015  . Renal hematoma 12/04/2015  . H/O: gout 11/16/2013  . Trochanteric bursitis 10/17/2013  . 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 Lumbar degenerative disc disease 07/17/2013    Past Medical  History:  Past Medical History:  Diagnosis Date  . Arthritis    Osteoarthritis  . Asthma   . Bladder cancer ([place]) dx'd 1990   surg only  . CHF (congestive heart failure) ([place])   . Diabetes mellitus without complication ([place])   . ESRD (end stage renal disease) ([name])    Tues, Thurs, Sat dialysis  . GERD (gastroesophageal reflux disease)   . History of cardiac catheterization    a. LHC 5/17: no obstructive CAD  . History of DVT (deep vein thrombosis)    2003 ish  . History of nuclear stress test    a. Myoview 4/17: EF 39%, inf, inf-lat, apical inf, apical lat, apical scar, intermediate risk  . Hyperlipidemia   . Hypertension   . NICM (nonischemic cardiomyopathy) ([place])    a. Echo 2/17: mod LVH, EF 35-40%, inf-lat and inf-sept HK, mod MR, severe LAE, small pericardial effusion   Past Surgical History:  Past Surgical History:  Procedure Laterality Date  . arthroscopic knee surgery Left   . AV FISTULA PLACEMENT Left 12/13/2015   Procedure: ARTERIOVENOUS (AV) FISTULA CREATION;  Surgeon: Angelia Mould, MD;  Location[facility] OR;  Service: Vascular;  Laterality: Left;  . AV FISTULA PLACEMENT Left 12/15/2017   Procedure: INSERTION OF ARTERIOVENOUS (AV) GORE-TEX GRAFT ARM;  Surgeon: Serafina Mitchell, MD;  Location: [facility];  Service: Vascular;  Laterality: Left;  . BACK SURGERY     2 times  1960  . CARDIAC CATHETERIZATION N/A 02/28/2016   Procedure: Left Heart Cath and Coronary Angiography;  Surgeon: Kirk Ruths  Claris Gladden, MD;  Location: [facility] LAB;  Service: Cardiovascular;  Laterality: N/A;  . CRANIOTOMY N/A 12/05/2017   Procedure: CRANIOTOMY HEMATOMA EVACUATION SUBDURAL;  Surgeon: Ashok Pall, MD;  Location: [facility];  Service: Neurosurgery;  Laterality: N/A;  . CYSTOSCOPY    . FISTULA SUPERFICIALIZATION Left 08/14/2016   Procedure: FISTULA SUPERFICIALIZATION LEFT UPPER ARM;  Surgeon: Angelia Mould, MD;  Location: [facility];  Service: Vascular;  Laterality: Left;  . INSERTION OF  DIALYSIS CATHETER N/A 12/13/2015   Procedure: INSERTION OF DIALYSIS CATHETER;  Surgeon: Angelia Mould, MD;  Location: [facility];  Service: Vascular;  Laterality: N/A;  . INSERTION OF DIALYSIS CATHETER Left 05/07/2017   Procedure: INSERTION OF DIALYSIS CATHETER LEFT INTERNAL JUGULAR PLACEMENT;  Surgeon: Angelia Mould, MD;  Location: [facility];  Service: Vascular;  Laterality: Left;  . INSERTION OF DIALYSIS CATHETER Left 12/15/2017   Procedure: INSERTION OF DIALYSIS CATHETER;  Surgeon: Serafina Mitchell, MD;  Location: [facility] OR;  Service: Vascular;  Laterality: Left;  . IR RADIOLOGIST EVAL & MGMT  05/12/2017  . JOINT REPLACEMENT Left    hip  . LIGATION OF COMPETING BRANCHES OF ARTERIOVENOUS FISTULA Left 08/14/2016   Procedure: LIGATION OF COMPETING BRANCHES OF ARTERIOVENOUS FISTULA LEFT UPPER ARM;  Surgeon: Angelia Mould, MD;  Location: [facility];  Service: Vascular;  Laterality: Left;  . PERIPHERAL VASCULAR CATHETERIZATION Left 06/09/2016   Procedure: Fistulagram;  Surgeon: Serafina Mitchell, MD;  Location: [facility] LAB;  Service: Cardiovascular;  Laterality: Left;  ARM  . PERIPHERAL VASCULAR CATHETERIZATION Left 06/09/2016   Procedure: Peripheral Vascular Balloon Angioplasty;  Surgeon: Serafina Mitchell, MD;  Location: [facility] LAB;  Service: Cardiovascular;  Laterality: Left;  upper arm venous  . PERIPHERAL VASCULAR CATHETERIZATION Left 06/25/2016   Procedure: Fistulagram;  Surgeon: Serafina Mitchell, MD;  Location: [facility] LAB;  Service: Cardiovascular;  Laterality: Left;  . REVISON OF ARTERIOVENOUS FISTULA Left 05/07/2017   Procedure: REPAIR OF PSEUDOANEURYSM  LEFT BRACHIO-CEPHALIC  ARM ARTERIOVENOUS FISTULA;  Surgeon: Angelia Mould, MD;  Location: [facility] OR;  Service: Vascular;  Laterality: Left;    Assessment & Plan Clinical Impression: Patient is a 72 y.o. year old male right handed male with history of bladder cancer with resection in 1999 lymphedema, end-stage renal  disease with hemodialysis, diastolic congestive heart failure, diabetes mellitus, hypertension, nonischemic cardiomyopathy. History taken from chart review. Patient lives with spouse and daughter. Independent with assistive device prior to admission. Independent ADLs and still driving. Wife works during the day. His daughter is in and out during the day. Presented 12/05/2017 with fatigue, vomiting and diarrhea 2 days as well as headache. He had missed his last dialysis session. Noted fall 12/03/2017 when he tripped on the carpet. Denied striking his head. Cranial CT reviewed showing right SDH. Per report, large right side subdural hematoma measuring 16 mm in width with significant mass effect and a 13 mm right to left midline shift. Mass effect effaces the sulci and right lateral ventricle. Uncal herniation present. Neurosurgery consulted underwent right frontotemporal parietal craniotomy hematoma evacuation 12/05/2017 per Dr. Cyndy Freeze. Hospital course hemodialysis ongoing with conversion of left temporary dialysis catheter to tunneled dialysis catheter after his left BC AVF thrombosed 12/15/2017 per Dr. Trula Slade. Intermittent bouts of restlessness maintained on Seroquel. Acute on chronic anemia 7.5 and monitored . Dysphagia #1 thin liquid diet. Nasogastric tube remains in place. MRSA PCR screening positive maintained on contact precautions   Patient transferred to CIR  on 12/16/2017 .    Patient currently requires max to total A +2 with basic self-care skills and functional mobility  secondary to muscle weakness and HAs, decreased cardiorespiratoy endurance, impaired timing and sequencing, unbalanced muscle activation, decreased coordination and decreased motor planning, decreased visual acuity and Diplopia, decreased motor planning, decreased awareness, decreased problem solving, decreased safety awareness and decreased memory and decreased sitting balance, decreased standing balance, decreased postural control,  hemiplegia and decreased balance strategies.  Prior to hospitalization, patient could complete ADL with modified independent .  Patient will benefit from skilled intervention to decrease level of assist with basic self-care skills and increase independence with basic self-care skills prior to discharge home with care partner.  Anticipate patient will require 24 hour supervision and follow up home health.  OT - End of Session Activity Tolerance: Tolerates 30+ min activity with multiple rests Endurance Deficit: Yes OT Assessment Rehab Potential (ACUTE ONLY): Good OT Patient demonstrates impairments in the following area(s): Balance;Skin Integrity;Cognition;Safety;Perception;Edema;Endurance;Vision;Motor;Pain OT Basic ADL's Functional Problem(s): Eating;Grooming;Bathing;Dressing;Toileting OT Transfers Functional Problem(s): Toilet;Tub/Shower OT Additional Impairment(s): Fuctional Use of Upper Extremity OT Plan OT Intensity: Minimum of 1-2 x/day, 45 to 90 minutes OT Frequency: 5 out of 7 days OT Duration/Estimated Length of Stay: ~21-24 days OT Treatment/Interventions: Balance/vestibular training;Discharge planning;Functional electrical stimulation;Pain management;Self Care/advanced ADL retraining;Therapeutic Activities;UE/LE Coordination activities;Cognitive remediation/compensation;Disease mangement/prevention;Functional mobility training;Patient/family education;Therapeutic Exercise;Visual/perceptual remediation/compensation;Community reintegration;DME/adaptive equipment instruction;Neuromuscular re-education;Psychosocial support;UE/LE Strength taining/ROM;Wheelchair propulsion/positioning OT Self Feeding Anticipated Outcome(s): supervision OT Basic Self-Care Anticipated Outcome(s): supervision to min A  OT Toileting Anticipated Outcome(s): min A  OT Bathroom Transfers Anticipated Outcome(s): supervision  OT Recommendation Recommendations for Other Services: Neuropsych consult Patient  destination: Home Follow Up Recommendations: Home health OT Equipment Recommended: To be determined   Skilled Therapeutic Intervention OT eval initiated with OT goals, purpose, and role discussed. Self care retraining at sink with focus on dressing with sit to stands. Pt with significant swelling in left UE due to new graft as well as noted decr coordination in all functional tasks. Pt initially needed +2 total A to come into standing for clothing management. Pt reports having a history of having difficulty with left knee and used a cane/ crutches as needed. Pt with noted crepitus in left knee with movement as well as knee inverts with foot deviating outward requiring total A to facilitate and  to stabilize at midline/ neutral position both posteriorly and anteriorly. Pt performed mod A transfer w/c to mat with max facilitation for weight shift forward and for head/ hip relationship. Pt able to stand with decr amt of assist for lifting assistance with grab bar to pull up on (at parallel bars) bur still required max facitilation for left knee. Trial of use of SARA to assist nursing with transfers on and off toilet. Pt able to tolerate sara for safe transfers and to promote more upright posture. Pt transfers with squat pivot to the left back into w/c with max A again with focus on forward weight shift.   Pt with c/o right calf pain- RN notified.  Left up upright in chair with call bell and safety belt.    OT Evaluation Precautions/Restrictions  Precautions Precautions: Fall Precaution Comments: weak left knee (had problems with it before) Restrictions Weight Bearing Restrictions: No General Chart Reviewed: Yes Family/Caregiver Present: No    Pain Pain Assessment Pain Assessment: Faces Pain Score: 6  Faces Pain Scale: Hurts even more Pain Type: Acute pain Pain Location: Head Pain Onset: On-going Pain Intervention(s): RN made aware Home Living/Prior  Functioning Home Living Available  Help at Discharge: Family, Available PRN/intermittently Type of Home: House Home Access: Stairs to enter Entrance Stairs-Number of Steps: 5 Home Layout: Two level Alternate Level Stairs-Number of Steps: flight- has a stair lift Bathroom Shower/Tub: Multimedia programmer: Handicapped height  Lives With: Spouse ADL ADL ADL Comments: see functional navigator Vision Baseline Vision/History: Wears glasses Wears Glasses: Reading only Patient Visual Report: Diplopia Vision Assessment?: Yes Ocular Range of Motion: Within Functional Limits Convergence: Within functional limits Visual Fields: No apparent deficits Diplopia Assessment: Present in far gaze;Present all the time/all directions Perception  Perception: Within Functional Limits Praxis Praxis: Impaired Praxis Impairment Details: Motor planning Cognition Overall Cognitive Status: Impaired/Different from baseline Arousal/Alertness: Awake/alert Orientation Level: Person;Place;Situation Person: Oriented Place: Oriented Situation: Oriented Year: 2019 Month: March Day of Week: Incorrect(Monday) Memory: Impaired Memory Impairment: Decreased recall of new information Immediate Memory Recall: Sock;Blue;Bed Memory Recall: Sock;Blue;Bed Memory Recall Sock: Without Cue Memory Recall Blue: With Cue Memory Recall Bed: Without Cue Focused Attention: Appears intact Sustained Attention: Appears intact Awareness: Impaired Awareness Impairment: Intellectual impairment Problem Solving: Impaired Problem Solving Impairment: Functional basic Executive Function: (all deficits due to lower level of cognition) Safety/Judgment: Impaired [employer] Scales of Cognitive Functioning: Automatic/appropriate Sensation Sensation Light Touch: Appears Intact Proprioception: Impaired Detail Proprioception Impaired Details: Impaired LLE Coordination Gross Motor Movements are Fluid and Coordinated: No Fine Motor Movements are Fluid  and Coordinated: Yes Coordination and Movement Description: left Ue very swollen from new HD graft; decr left gross coordination with reaching for objects. Motor  Motor Motor: Hemiplegia Motor - Skilled Clinical Observations: generalized weakness; acute pain in Head Mobility  Transfers Transfers: Sit to Stand;Stand to Sit Sit to Stand: 1: +2 Total assist Sit to Stand: Patient Percentage: 40% Stand to Sit: 2: Max assist  Trunk/Postural Assessment  Cervical Assessment Cervical Assessment: Within Functional Limits Thoracic Assessment Thoracic Assessment: Exceptions to WFL(kyphotic) Lumbar Assessment Lumbar Assessment: (posterior pelvic tilt) Postural Control Postural Control: Deficits on evaluation Protective Responses: delayed  Balance Balance Balance Assessed: Yes Static Sitting Balance Static Sitting - Level of Assistance: 5: Stand by assistance Dynamic Sitting Balance Dynamic Sitting - Balance Support: During functional activity Dynamic Sitting - Level of Assistance: 4: Min Insurance risk surveyor Standing - Balance Support: During functional activity Static Standing - Level of Assistance: 1: +2 Total assist Static Standing - Comment/# of Minutes: ~ 1 min with bilateral UE support and heavy facilitation to maintain left LE (knee) at midline and in neutral Extremity/Trunk Assessment RUE Assessment RUE Assessment: Within Functional Limits LUE Assessment LUE Assessment: Exceptions to WFL LUE AROM (degrees) LUE Overall AROM Comments: 0-100 degrees - pt with increased edema in ARM from new HD graft  LUE Strength LUE Overall Strength Comments: 3+/5   See Function Navigator for Current Functional Status.   Refer to Care Plan for Long Term Goals  Recommendations for other services: Neuropsych and Therapeutic Recreation  Stress management and Outing/community reintegration   Discharge Criteria: Patient will be discharged from OT if patient refuses treatment 3  consecutive times without medical reason, if treatment goals not met, if there is a change in medical status, if patient makes no progress towards goals or if patient is discharged from hospital.  The above assessment, treatment plan, treatment alternatives and goals were discussed and mutually agreed upon: by patient  Nicoletta Ba 12/17/2017, 11:49 AM

## 2017-12-18 ENCOUNTER — Inpatient Hospital Stay (HOSPITAL_COMMUNITY): Payer: Medicare Other | Admitting: Occupational Therapy

## 2017-12-18 ENCOUNTER — Inpatient Hospital Stay (HOSPITAL_COMMUNITY): Payer: Medicare Other

## 2017-12-18 ENCOUNTER — Inpatient Hospital Stay (HOSPITAL_COMMUNITY): Payer: Medicare Other | Admitting: Physical Therapy

## 2017-12-18 ENCOUNTER — Encounter (HOSPITAL_COMMUNITY): Payer: Self-pay | Admitting: Interventional Radiology

## 2017-12-18 DIAGNOSIS — E1142 Type 2 diabetes mellitus with diabetic polyneuropathy: Secondary | ICD-10-CM

## 2017-12-18 DIAGNOSIS — I10 Essential (primary) hypertension: Secondary | ICD-10-CM

## 2017-12-18 DIAGNOSIS — I5033 Acute on chronic diastolic (congestive) heart failure: Secondary | ICD-10-CM

## 2017-12-18 DIAGNOSIS — I82411 Acute embolism and thrombosis of right femoral vein: Secondary | ICD-10-CM

## 2017-12-18 HISTORY — PX: IR IVC FILTER PLMT / S&I /IMG GUID/MOD SED: IMG701

## 2017-12-18 LAB — CBC
HCT: 28.3 % — ABNORMAL LOW (ref 39.0–52.0)
HEMOGLOBIN: 9 g/dL — AB (ref 13.0–17.0)
MCH: 29.9 pg (ref 26.0–34.0)
MCHC: 31.8 g/dL (ref 30.0–36.0)
MCV: 94 fL (ref 78.0–100.0)
Platelets: 215 10*3/uL (ref 150–400)
RBC: 3.01 MIL/uL — ABNORMAL LOW (ref 4.22–5.81)
RDW: 18.4 % — ABNORMAL HIGH (ref 11.5–15.5)
WBC: 7.5 10*3/uL (ref 4.0–10.5)

## 2017-12-18 LAB — RENAL FUNCTION PANEL
ALBUMIN: 2.5 g/dL — AB (ref 3.5–5.0)
Anion gap: 13 (ref 5–15)
BUN: 20 mg/dL (ref 6–20)
CALCIUM: 7.9 mg/dL — AB (ref 8.9–10.3)
CO2: 25 mmol/L (ref 22–32)
Chloride: 94 mmol/L — ABNORMAL LOW (ref 101–111)
Creatinine, Ser: 3.82 mg/dL — ABNORMAL HIGH (ref 0.61–1.24)
GFR calc Af Amer: 17 mL/min — ABNORMAL LOW (ref >60)
GFR calc non Af Amer: 14 mL/min — ABNORMAL LOW (ref >60)
GLUCOSE: 142 mg/dL — AB (ref 65–99)
PHOSPHORUS: 4 mg/dL (ref 2.5–4.6)
Potassium: 3.4 mmol/L — ABNORMAL LOW (ref 3.5–5.1)
SODIUM: 132 mmol/L — AB (ref 135–145)

## 2017-12-18 LAB — IRON AND TIBC
IRON: 90 ug/dL (ref 45–182)
SATURATION RATIOS: 49 % — AB (ref 17.9–39.5)
TIBC: 183 ug/dL — ABNORMAL LOW (ref 250–450)
UIBC: 93 ug/dL

## 2017-12-18 LAB — GLUCOSE, CAPILLARY
Glucose-Capillary: 143 mg/dL — ABNORMAL HIGH (ref 65–99)
Glucose-Capillary: 139 mg/dL — ABNORMAL HIGH (ref 65–99)
Glucose-Capillary: 140 mg/dL — ABNORMAL HIGH (ref 65–99)
Glucose-Capillary: 175 mg/dL — ABNORMAL HIGH (ref 65–99)

## 2017-12-18 MED ORDER — LIDOCAINE HCL 1 % IJ SOLN
INTRAMUSCULAR | Status: AC
  Filled 2017-12-18: qty 20

## 2017-12-18 MED ORDER — OXYCODONE HCL 5 MG PO TABS
ORAL_TABLET | ORAL | Status: AC
  Filled 2017-12-18: qty 1

## 2017-12-18 MED ORDER — DARBEPOETIN ALFA 150 MCG/0.3ML IJ SOSY
150.0000 ug | PREFILLED_SYRINGE | INTRAMUSCULAR | Status: DC
  Administered 2017-12-18 – 2018-01-01 (×3): 150 ug via INTRAVENOUS
  Filled 2017-12-18 (×3): qty 0.3

## 2017-12-18 MED ORDER — OXYCODONE HCL 5 MG PO TABS
5.0000 mg | ORAL_TABLET | Freq: Once | ORAL | Status: AC
  Administered 2017-12-18: 5 mg via ORAL

## 2017-12-18 MED ORDER — DARBEPOETIN ALFA 200 MCG/0.4ML IJ SOSY
PREFILLED_SYRINGE | INTRAMUSCULAR | Status: AC
  Administered 2017-12-18: 150 ug via INTRAVENOUS
  Filled 2017-12-18: qty 0.4

## 2017-12-18 MED ORDER — FENTANYL CITRATE (PF) 100 MCG/2ML IJ SOLN
INTRAMUSCULAR | Status: AC
  Filled 2017-12-18: qty 4

## 2017-12-18 MED ORDER — FENTANYL CITRATE (PF) 100 MCG/2ML IJ SOLN
INTRAMUSCULAR | Status: AC | PRN
  Administered 2017-12-18 (×2): 50 ug via INTRAVENOUS

## 2017-12-18 MED ORDER — LIDOCAINE HCL 1 % IJ SOLN
INTRAMUSCULAR | Status: AC | PRN
  Administered 2017-12-18: 5 mL

## 2017-12-18 MED ORDER — IOPAMIDOL (ISOVUE-300) INJECTION 61%
INTRAVENOUS | Status: AC
  Administered 2017-12-18: 45 mL
  Filled 2017-12-18: qty 150

## 2017-12-18 MED ORDER — DARBEPOETIN ALFA 150 MCG/0.3ML IJ SOSY
PREFILLED_SYRINGE | INTRAMUSCULAR | Status: AC
  Filled 2017-12-18: qty 0.3

## 2017-12-18 MED ORDER — DARBEPOETIN ALFA 150 MCG/0.3ML IJ SOSY: 150.0000 ug | PREFILLED_SYRINGE | INTRAMUSCULAR | Status: DC

## 2017-12-18 NOTE — Progress Notes (Signed)
Occupational Therapy Session Note  Patient Details  Name: Albert Stanley MRN: [id number] Date of Birth: November 07, 1945  Today's Date: 12/18/2017 60 minutes missed- pt on bedrest    Short Term Goals: Week 1:  OT Short Term Goal 1 (Week 1): Pt will transfer to BSC/ toilet with mod A +1  OT Short Term Goal 2 (Week 1): Pt will perform sit to stands in prep for clothing mangement with mod A  OT Short Term Goal 3 (Week 1): Pt will self feed after setup with supervision  OT Short Term Goal 4 (Week 1): Pt maintain dynamic sitting balance with supervision during functional tasks  Skilled Therapeutic Interventions/Progress Updates:    Pt not seen for scheduled therapy due to bedrest orders. Will return to see pt when he is medically appropriate for tx participation.   Therapy Documentation Precautions:  Precautions Precautions: Fall Precaution Comments: weak left knee (had problems with it before) Restrictions Weight Bearing Restrictions: No Vital Signs: Therapy Vitals Pulse Rate: 81 Resp: 13 BP: 111/69 Patient Position (if appropriate): Lying Oxygen Therapy SpO2: 100 % O2 Device: Nasal Cannula O2 Flow Rate (L/min): 2 L/min Pulse Oximetry Type: Continuous SpO2 Alarm Limit Low: 100 Oximetry Probe Site Changed: Yes End Tidal CO2 (EtCO2): 36 Pain Assessment Pain Assessment: 0-10 Pain Score: 7  Pain Type: Acute pain Pain Location: Head Pain Orientation: Left Pain Descriptors / Indicators: Aching Pain Frequency: Occasional Pain Onset: On-going Pain Intervention(s): RN made aware;Emotional support ADL: ADL ADL Comments: see functional navigator     See Function Navigator for Current Functional Status.   Therapy/Group: Individual Therapy  Yonael Tulloch A Rozina Pointer 12/18/2017, 1:03 PM

## 2017-12-18 NOTE — Progress Notes (Signed)
Unable to see pt for PM 75 minute appt due to pt in dialysis.

## 2017-12-18 NOTE — Progress Notes (Signed)
Physical Therapy Note  Patient Details  Name: Albert Stanley MRN: [id number] Date of Birth: Dec 17, 1945 Today's Date: 12/18/2017    Pt off floor for IVC filter placement due to + [place]. 30 minutes missed minutes.   [name] 12/18/2017, 9:24 AM

## 2017-12-18 NOTE — Procedures (Signed)
  Procedure: IVCgram and filter placement   EBL:   minimal Complications:  none immediate  See full dictation in [facility] PACS.  D. Theona Muhs MD Main # [phone] Pager  [phone]    

## 2017-12-18 NOTE — Progress Notes (Addendum)
Speech Language Pathology Daily Session Note  Patient Details  Name: Albert Stanley MRN: [id number] Date of Birth: 1946/03/30  Today's Date: 12/18/2017 SLP Individual Time: 1200-1227 SLP Individual Time Calculation (min): 27 min  Short Term Goals: Week 1: SLP Short Term Goal 1 (Week 1): Pt will consume regular textures and thin liquids with mod I use of swallowing precautions and minimal overt s/s of aspiration.  SLP Short Term Goal 2 (Week 1): Pt will selectively attend to tasks in a moderately distracting environment for 15 minutes with supervision cues for redirection to task.   SLP Short Term Goal 3 (Week 1): Pt will complete semi-complex tasks with min verbal cues for functional problem solving.   SLP Short Term Goal 4 (Week 1): Pt will recall semi-complex daily information with min verbal cues for use of external aids.   SLP Short Term Goal 5 (Week 1): Pt will recognize and correct errors in the moment during structured tasks with min verbal cues.    Skilled Therapeutic Interventions: Pt seen this date to address cognitive goals. SLP facilitated session by providing Min A verbal cues to complete visual + verbal sequencing task of common daily events/tasks (ADL's); of note, pt unaware of errors made during this particular task and benefited from [place] A verbal cues to reconsider initial choice - cues were faded to Mod I once initial step was selected. Provided verbal education re: use of external aids to facilitate recall of daily information including room number, date, names of staff members, and therapies. Mod I to Supervision verbal cues to locate information on external aids to answer questions correctly re: name of therapist and discipline following delay. Located room number with Mod I use of whiteboard in room. Pt with decreased sustained attention and required Min A verbal cues for redirection x 3. Pt unable to provide response re: recommendation for speech therapy; therefore,  re-educated pt re: role of SLP, performance on assessment, current status, and POC; he verbalized understanding. Pt left in bed with all needs, including call bell, within reach. SLP to continue current POC.   Function:  Eating Eating   Modified Consistency Diet: No Eating Assist Level: Supervision or verbal cues;Swallowing techniques: self managed           Cognition Comprehension Comprehension assist level: Understands complex 90% of the time/cues 10% of the time  Expression   Expression assist level: Expresses complex 90% of the time/cues < 10% of the time  Social Interaction Social Interaction assist level: Interacts appropriately 90% of the time - Needs monitoring or encouragement for participation or interaction.  Problem Solving Problem solving assist level: Solves basic 90% of the time/requires cueing < 10% of the time  Memory Memory assist level: Recognizes or recalls 75 - 89% of the time/requires cueing 10 - 24% of the time    Pain Pain Assessment Pain Assessment: 0-10 Pain Score: 7  Pain Type: Acute pain Pain Location: Head Pain Intervention(s): RN made aware;Emotional support  Therapy/Group: Individual Therapy  Albert Stanley 12/18/2017, 12:36 PM

## 2017-12-18 NOTE — Progress Notes (Addendum)
[facility] Progress Note  Subjective: Had IVC filter placed this am for LE DVT  Vitals:   12/18/17 0918 12/18/17 0932 12/18/17 0936 12/18/17 0941  BP: 126/75 120/69 113/66 111/69  Pulse: 83 81 80 81  Resp: 13 12 14 13   Temp:      TempSrc:      SpO2: 100% 100% 100% 100%  Weight:        Inpatient medications: . allopurinol  300 mg Oral Daily  . amiodarone  200 mg Oral Daily  . calcitRIOL  0.5 mcg Oral Q T,Th,Sa-HD  . darbepoetin (ARANESP) injection - DIALYSIS  150 mcg Intravenous Q Sat-HD  . feeding supplement (PRO-STAT SUGAR FREE 64)  30 mL Oral BID  . insulin aspart  0-9 Units Subcutaneous TID WC  . lidocaine      . multivitamin  1 tablet Oral QHS  . mupirocin ointment   Nasal BID  . oxybutynin  5 mg Oral BID  . pantoprazole  40 mg Oral QHS  . polyethylene glycol  17 g Oral Daily  . sevelamer carbonate  2,400 mg Oral TID WC    acetaminophen, albuterol, bisacodyl, diphenhydrAMINE, heparin, MUSCLE RUB, ondansetron **OR** ondansetron (ZOFRAN) IV, oxyCODONE-acetaminophen, sorbitol  Exam: General: alert , chronically ill AAM, alert and Ox 3 Heart: RRR , no m,r,g  Lungs: CTA  bilat  Abdomen: Bs pos .,soft , nt,nd  Extremities: 2+ dependent edema, 2+ LUE edema Dialysis Access:  L IJ Perm cath / LUA AVG pos bruit    OP Dialysis:TTS [facility] 4h 88.5kg 2/2.25 bath P4 LUA AVF Hep 7000 Venofer 50mg  IV qHD Mircera 179mcg IV q2wks - last 2/14 Calcitriol 3.17mcg PO qHD  Sensipar 30mg  qd Renvela 800mg - 2AC TID, 2 w/snacks  Assessment/Plan: 1. SP Fall /SDH  With Crainiotomy  12/05/17 - rehab med eval in process/ Diet advancing 2. DVT LE - not anticoag candidate, sp IVC filter today 3/9 3. Clotted access -sp new LUA AVG and L IJ TDC 3/6; postop LUE edema, f/b VVS 4. ESRD - HD TTS. HD today 5. HTN/vol - improved, but still up 5kg 6. Anemia - sp transfusion  hgb 7- 8, ^darbe to 150 q Sat 7. Secondary hyperparathyroidism - binders now eating  Phos 6.1  /corec Ca 8.8  8. DM type 2- per Viviana Simpler MD [facility] pager [phone]   12/18/2017, 12:46 PM   Recent Labs  Lab 12/12/17 0931 12/14/17 1939 12/15/17 0603 12/16/17 1213  NA 134* 131* 131* 131*  K 3.6 4.2 4.6 4.0  CL 95* 90* 92* 92*  CO2 25 26 23 25   GLUCOSE 97 87 126* 127*  BUN 45* 84* 92* 48*  CREATININE 4.79* 7.49* 8.01* 5.61*  CALCIUM 8.3* 7.9* 7.7* 7.4*  PHOS 5.0* 6.1*  --  5.4*   Recent Labs  Lab 12/12/17 0931 12/14/17 1939 12/16/17 1213  ALBUMIN 2.6* 2.4* 2.4*   Recent Labs  Lab 12/14/17 1939 12/16/17 1213  WBC 7.8 7.0  HGB 7.5* 7.8*  HCT 22.4* 23.9*  MCV 92.6 93.0  PLT 194 199   Iron/TIBC/Ferritin/ %Sat    Component Value Date/Time   IRON 17 (L) 12/07/2015 2122   TIBC 213 (L) 12/07/2015 2122   FERRITIN 544 (H) 12/07/2015 2122   IRONPCTSAT 8 (L) 12/07/2015 2122   IRONPCTSAT 24 05/03/2006 1529

## 2017-12-18 NOTE — Progress Notes (Signed)
Patient for IVC filter today; Patient was informed not to eat by RN. Patient is aware. NT went to room and pt already  ate part of his meal. Per report no order to keep patient NPO but RN kept him NPO due to procedure. IR called and  notified that patient ate. Patient agreed to proceed with procedure by numbing site. Patient called wife that he will proceed with the procedure.

## 2017-12-18 NOTE — Progress Notes (Addendum)
[place] PHYSICAL MEDICINE & REHABILITATION     PROGRESS NOTE    Subjective/Complaints: Patient seen lying in bed this morning. He states he slept well overnight after receiving medication. He has questions about IVC filter placement.  ROS: Denies CP, SOB, nausea, vomiting, diarrhea.   Objective: Vital Signs: Blood pressure 111/69, pulse 81, temperature 98.3 F (36.8 C), temperature source Oral, resp. rate 13, weight 93 kg (205 lb 0.4 oz), SpO2 100 %. No results found. Recent Labs    12/16/17 1213  WBC 7.0  HGB 7.8*  HCT 23.9*  PLT 199   Recent Labs    12/16/17 1213  NA 131*  K 4.0  CL 92*  GLUCOSE 127*  BUN 48*  CREATININE 5.61*  CALCIUM 7.4*   CBG (last 3)  Recent Labs    12/17/17 2131 12/18/17 0632 12/18/17 1130  GLUCAP 168* 143* 139*    Wt Readings from Last 3 Encounters:  12/18/17 93 kg (205 lb 0.4 oz)  12/16/17 91.5 kg (201 lb 11.5 oz)  12/04/17 91.3 kg (201 lb 4.5 oz)    Physical Exam:  Constitutional. Patient is alert. NAD. Vital signs reviewed.  HEENT. Craniotomy site clean and dry with staples Eyes. EOMI. No discharge. Cardiovascular: RRR. No JVD  Respiratory: CTA Bilaterally. Normal effort  GI: BS +, non-distended  Skin. Warm and dry. No new lesions on visible skin. Neurological: He is alert and oriented Fair insight and awareness  Motor: RUE: 5/5 proximal to distal LUE: 3/5 proximal to distal RLE: 3/5 HF, KE, 4-/5 ADF LLE: HF 2+/5, KE 2+/5, 2+ ADF  Psych: generally pleasant and cooperative   Assessment/Plan: 1. Functional deficits and left HP secondary to right SDH which require 3+ hours per day of interdisciplinary therapy in a comprehensive inpatient rehab setting. Physiatrist is providing close team supervision and 24 hour management of active medical problems listed below. Physiatrist and rehab team continue to assess barriers to discharge/monitor patient progress toward functional and medical  goals.  Function:  Bathing Bathing position      Bathing parts      Bathing assist        Upper Body Dressing/Undressing Upper body dressing   What is the patient wearing?: Pull over shirt/dress       Pull over shirt/dress - Perfomed by helper: Thread/unthread right sleeve, Thread/unthread left sleeve, Put head through opening, Pull shirt over trunk        Upper body assist        Lower Body Dressing/Undressing Lower body dressing   What is the patient wearing?: Pants, Non-skid slipper socks       Pants- Performed by helper: Thread/unthread right pants leg, Thread/unthread left pants leg, Pull pants up/down   Non-skid slipper socks- Performed by helper: Don/doff right sock, Don/doff left sock                  Lower body assist Assist for lower body dressing: Touching or steadying assistance (Pt > 75%), 2 Helpers      Toileting Toileting Toileting activity did not occur: No continent bowel/bladder event        Toileting assist     Transfers Chair/bed transfer   Chair/bed transfer method: Lateral scoot Chair/bed transfer assist level: Maximal assist (Pt 25 - 49%/lift and lower) Chair/bed transfer assistive device: Bedrails, Armrests     Locomotion Ambulation Ambulation activity did not occur: Safety/medical concerns         Wheelchair     Max wheelchair distance:  25 ft Assist Level: Touching or steadying assistance (Pt > 75%)  Cognition Comprehension Comprehension assist level: Understands complex 90% of the time/cues 10% of the time  Expression Expression assist level: Expresses complex 90% of the time/cues < 10% of the time  Social Interaction Social Interaction assist level: Interacts appropriately 90% of the time - Needs monitoring or encouragement for participation or interaction.  Problem Solving Problem solving assist level: Solves basic problems with no assist  Memory Memory assist level: Recognizes or recalls 75 - 89% of the  time/requires cueing 10 - 24% of the time   Medical Problem List and Plan:  1. Decreased functional mobility with left-sided weakness and dysphagia secondary to traumatic right subdural hematoma status post right frontotemporal parietal craniotomy 12/05/2017    Cont CIR after IVC filter placement 2. DVT Prophylaxis/Anticoagulation: SCDs.    Dopplers suggesting right from oral and posterior tibial DVT and left peroneal DVT - plan for placement of IVC filter today due to SDH 3. Pain Management: Oxycodone as needed  4. Mood: Seroquel 25 mg twice a day  5. Neuropsych: This patient is capable of making decisions on his own behalf.  6. Skin/Wound Care: Routine skin checks  7. Fluids/Electrolytes/Nutrition: Routine I&O's    -encourage PO    -doesn't want protein supps.   -will ask RD to see   Diet upgraded to regular texture diet on 3/8 8. End-stage renal disease. Continue hemodialysis as per renal services. Status post arteriovenous Gore-Tex graft left arm 12/15/2017  -HD scheduled after therapies to maximize therapy time/performance  9. Diabetes mellitus and peripheral neuropathy. Latest hemoglobin A1c 6.4. SSI. Check blood sugars before meals and at bedtime    Elevated on 3/9 10. Acute on chronic anemia. Continue Aranesp.    Hemoglobin 7.8 on 3/7 11. 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 Monitor for any signs of fluid overload   Filed Weights   12/17/17 0115 12/18/17 0516  Weight: 92 kg (202 lb 13.2 oz) 93 kg (205 lb 0.4 oz)    Stable 12. Hypertension. Monitor with increased mobility    Relatively controlled on 3/9 13. MRSA PCR screening positive. Contact precautions  14. Dysphagia: Reveals regular texture diet on 3/8   LOS (Days) 2 A FACE TO FACE EVALUATION WAS PERFORMED      Ankit Lorie Phenix, MD 12/18/2017 11:41 AM

## 2017-12-18 NOTE — Progress Notes (Signed)
Patient refuses Renvela claims it almost choke him yesterday. Dr. Jonnie Finner notified.

## 2017-12-18 NOTE — Plan of Care (Signed)
  RH BOWEL ELIMINATION RH STG MANAGE BOWEL WITH ASSISTANCE Description STG Manage Bowel with  Mod Assistance. LBM 3/1; laxatives given. 12/18/2017 0919 - Not Progressing by Ander Slade, RN

## 2017-12-19 ENCOUNTER — Inpatient Hospital Stay (HOSPITAL_COMMUNITY): Payer: Medicare Other | Admitting: Occupational Therapy

## 2017-12-19 DIAGNOSIS — D638 Anemia in other chronic diseases classified elsewhere: Secondary | ICD-10-CM

## 2017-12-19 DIAGNOSIS — R0989 Other specified symptoms and signs involving the circulatory and respiratory systems: Secondary | ICD-10-CM

## 2017-12-19 DIAGNOSIS — D62 Acute posthemorrhagic anemia: Secondary | ICD-10-CM

## 2017-12-19 LAB — GLUCOSE, CAPILLARY
GLUCOSE-CAPILLARY: 178 mg/dL — AB (ref 65–99)
Glucose-Capillary: 151 mg/dL — ABNORMAL HIGH (ref 65–99)
Glucose-Capillary: 109 mg/dL — ABNORMAL HIGH (ref 65–99)
Glucose-Capillary: 113 mg/dL — ABNORMAL HIGH (ref 65–99)

## 2017-12-19 MED ORDER — BISACODYL 10 MG RE SUPP
10.0000 mg | Freq: Every day | RECTAL | Status: DC | PRN
  Administered 2017-12-21: 10 mg via RECTAL
  Filled 2017-12-19: qty 1

## 2017-12-19 MED ORDER — SEVELAMER CARBONATE 800 MG PO TABS
1600.0000 mg | ORAL_TABLET | Freq: Three times a day (TID) | ORAL | Status: DC
  Administered 2017-12-19 – 2018-01-05 (×49): 1600 mg via ORAL
  Filled 2017-12-19 (×54): qty 2

## 2017-12-19 MED ORDER — SENNOSIDES-DOCUSATE SODIUM 8.6-50 MG PO TABS
2.0000 | ORAL_TABLET | Freq: Every day | ORAL | Status: DC
  Administered 2017-12-19 – 2017-12-27 (×9): 2 via ORAL
  Filled 2017-12-19 (×9): qty 2

## 2017-12-19 NOTE — Plan of Care (Signed)
  Not Progressing RH BOWEL ELIMINATION RH STG MANAGE BOWEL WITH ASSISTANCE Description STG Manage Bowel with  Mod Assistance.  No BM > 7 days. 12/19/2017 0420 - Not Progressing by Cornell Barman, RN RH STG MANAGE BOWEL W/MEDICATION W/ASSISTANCE Description STG Manage Bowel with Medication with mod Assistance.  12/19/2017 0420 - Not Progressing by Cornell Barman, RN

## 2017-12-19 NOTE — Progress Notes (Signed)
[place] PHYSICAL MEDICINE & REHABILITATION     PROGRESS NOTE    Subjective/Complaints: Patient seen lying in bed this morning. He states he slept well overnight. He states he did not have any issues with his IVC filter placement yesterday. Discussed functional progress with therapies yesterday. Patient also noted to be constipated per nursing.  ROS: Denies CP, SOB, nausea, vomiting, diarrhea.   Objective: Vital Signs: Blood pressure (!) 144/55, pulse 90, temperature 98.6 F (37 C), temperature source Oral, resp. rate 18, weight 91 kg (200 lb 9.9 oz), SpO2 98 %. Ir Ivc Filter Plmt / S&i /img Guid/mod Sed  Result Date: 12/18/2017 CLINICAL DATA:  Bilateral DVT. Subdural hematoma, a relative contraindication anticoagulation. Caval filtration is requested. EXAM: INFERIOR VENACAVOGRAM IVC FILTER PLACEMENT UNDER FLUOROSCOPY FLUOROSCOPY TIME:  0.7 minute; 81 uGym2 DAP TECHNIQUE: The procedure, risks (including but not limited to bleeding, infection, organ damage ), benefits, and alternatives were explained to the patient. Questions regarding the procedure were encouraged and answered. The patient understands and consents to the procedure. Ultrasound demonstrates thrombus in the right IJ vein. Patency of the right external jugular vein was confirmed with ultrasound with image documentation. An appropriate skin site was determined. Skin site was marked, prepped with chlorhexidine, and draped using maximum barrier technique. The region was infiltrated locally with 1% lidocaine. Intravenous Fentanyl administered for pain control during continuous monitoring of the patient's level of consciousness and physiological / cardiorespiratory status by the radiology RN, with a total of survey shin time of 10 minutes. Under real-time ultrasound guidance, the right EJ vein was accessed with a 21 gauge micropuncture needle; the needle tip within the vein was confirmed with ultrasound image documentation. The needle was  exchanged over a 018 guidewire for a transitional dilator, which allow advancement of the [facility] wire into the IVC. A long 6 French vascular sheath was placed for inferior venacavography. This demonstrated no caval thrombus. Renal vein inflows were evident. The [facility] IVC filter was advanced through the sheath and successfully deployed under fluoroscopy at the L2 level. Followup cavagram demonstrates stable filter position and no evident complication. The sheath was removed and hemostasis achieved at the site. No immediate complication. IMPRESSION: 1. Normal IVC. No thrombus or significant anatomic variation. 2. Technically successful infrarenal IVC filter placement. This is a retrievable model. PLAN: This IVC filter is potentially retrievable. The patient will be assessed for filter retrieval by Interventional Radiology in approximately 8-12 weeks. Further recommendations regarding filter retrieval, continued surveillance or declaration of device permanence, will be made at that time. Electronically Signed   By: Lucrezia Europe M.D.   On: 12/18/2017 12:17   Recent Labs    12/16/17 1213 12/18/17 1914  WBC 7.0 7.5  HGB 7.8* 9.0*  HCT 23.9* 28.3*  PLT 199 215   Recent Labs    12/16/17 1213 12/18/17 1914  NA 131* 132*  K 4.0 3.4*  CL 92* 94*  GLUCOSE 127* 142*  BUN 48* 20  CREATININE 5.61* 3.82*  CALCIUM 7.4* 7.9*   CBG (last 3)  Recent Labs    12/18/17 1830 12/18/17 2114 12/19/17 0640  GLUCAP 140* 175* 151*    Wt Readings from Last 3 Encounters:  12/19/17 91 kg (200 lb 9.9 oz)  12/16/17 91.5 kg (201 lb 11.5 oz)  12/04/17 91.3 kg (201 lb 4.5 oz)    Physical Exam:  Constitutional. Patient is alert. NAD. Vital signs reviewed.  HEENT. Craniotomy site clean and dry with staples Eyes. EOMI. No discharge. Cardiovascular:  RRR. No JVD  Respiratory: CTA Bilaterally. Normal effort  GI: BS +, non-distended  Skin. Warm and dry. No new lesions on visible skin. Neurological: He is alert and  oriented Fair insight and awareness  Motor: RUE: 5/5 proximal to distal LUE: 3/5 proximal to distal RLE: 3-/5 HF, KE, 4-/5 ADF LLE: HF 2-/5, KE 2+/5, 2+ ADF  Psych: generally pleasant and cooperative   Assessment/Plan: 1. Functional deficits and left HP secondary to right SDH which require 3+ hours per day of interdisciplinary therapy in a comprehensive inpatient rehab setting. Physiatrist is providing close team supervision and 24 hour management of active medical problems listed below. Physiatrist and rehab team continue to assess barriers to discharge/monitor patient progress toward functional and medical goals.  Function:  Bathing Bathing position      Bathing parts      Bathing assist        Upper Body Dressing/Undressing Upper body dressing   What is the patient wearing?: Pull over shirt/dress       Pull over shirt/dress - Perfomed by helper: Thread/unthread right sleeve, Thread/unthread left sleeve, Put head through opening, Pull shirt over trunk        Upper body assist        Lower Body Dressing/Undressing Lower body dressing   What is the patient wearing?: Pants, Non-skid slipper socks       Pants- Performed by helper: Thread/unthread right pants leg, Thread/unthread left pants leg, Pull pants up/down   Non-skid slipper socks- Performed by helper: Don/doff right sock, Don/doff left sock                  Lower body assist Assist for lower body dressing: Touching or steadying assistance (Pt > 75%), 2 Helpers      Toileting Toileting Toileting activity did not occur: No continent bowel/bladder event        Toileting assist     Transfers Chair/bed transfer   Chair/bed transfer method: Lateral scoot Chair/bed transfer assist level: Maximal assist (Pt 25 - 49%/lift and lower) Chair/bed transfer assistive device: Bedrails, Armrests     Locomotion Ambulation Ambulation activity did not occur: Safety/medical concerns         Wheelchair      Max wheelchair distance: 25 ft Assist Level: Touching or steadying assistance (Pt > 75%)  Cognition Comprehension Comprehension assist level: Understands complex 90% of the time/cues 10% of the time  Expression Expression assist level: Expresses complex 90% of the time/cues < 10% of the time  Social Interaction Social Interaction assist level: Interacts appropriately 90% of the time - Needs monitoring or encouragement for participation or interaction.  Problem Solving Problem solving assist level: Solves basic 90% of the time/requires cueing < 10% of the time  Memory Memory assist level: Recognizes or recalls 75 - 89% of the time/requires cueing 10 - 24% of the time   Medical Problem List and Plan:  1. Decreased functional mobility with left-sided weakness and dysphagia secondary to traumatic right subdural hematoma status post right frontotemporal parietal craniotomy 12/05/2017    Cont CIR   Discussed functional status with therapies, however no consistent therapists at this time. Patient notes function is relatively stable. 2. DVT Prophylaxis/Anticoagulation: SCDs.    Dopplers suggesting right from oral and posterior tibial DVT and left peroneal DVT - status post IVC filter placement on 3/9 3. Pain Management: Oxycodone as needed  4. Mood: Seroquel 25 mg twice a day  5. Neuropsych: This patient is capable of making  decisions on his own behalf.  6. Skin/Wound Care: Routine skin checks  7. Fluids/Electrolytes/Nutrition: Routine I&O's    -encourage PO    -doesn't want protein supps.   -will ask RD to see   Diet upgraded to regular texture diet on 3/8 8. End-stage renal disease. Continue hemodialysis as per renal services. Status post arteriovenous Gore-Tex graft left arm 12/15/2017  -HD scheduled after therapies to maximize therapy time/performance  9. Diabetes mellitus and peripheral neuropathy. Latest hemoglobin A1c 6.4. SSI. Check blood sugars before meals and at bedtime    Elevated on  3/10, continue to monitor for trend 10. Acute on chronic anemia. Continue Aranesp.    Hemoglobin 9.0 on 3/9 11. 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 Monitor for any signs of fluid overload   Filed Weights   12/18/17 1340 12/18/17 1740 12/19/17 0536  Weight: 93.8 kg (206 lb 12.7 oz) 90.3 kg (199 lb 1.2 oz) 91 kg (200 lb 9.9 oz)    Relatively stable on 3/10 12. Hypertension. Monitor with increased mobility    Labile on 3/10, monitor for trend 13. MRSA PCR screening positive. Contact precautions  14. Dysphagia: Reveals regular texture diet on 3/8   LOS (Days) 3 A FACE TO FACE EVALUATION WAS PERFORMED      Ankit Lorie Phenix, MD 12/19/2017 8:09 AM

## 2017-12-19 NOTE — Progress Notes (Signed)
Occupational Therapy Session Note  Patient Details  Name: Albert Stanley MRN: [id number] Date of Birth: [birth year]  Today's Date: 12/19/2017 OT Individual Time: 0902-1003 OT Individual Time Calculation (min): 61 min   Short Term Goals: Week 1:  OT Short Term Goal 1 (Week 1): Pt will transfer to BSC/ toilet with mod A +1  OT Short Term Goal 2 (Week 1): Pt will perform sit to stands in prep for clothing mangement with mod A  OT Short Term Goal 3 (Week 1): Pt will self feed after setup with supervision  OT Short Term Goal 4 (Week 1): Pt maintain dynamic sitting balance with supervision during functional tasks  Skilled Therapeutic Interventions/Progress Updates:    Bedrest orders lifted. Per RN, no activity restrictions from MD. Tx focus on functional transfers, sit<stands, cognition, and activity tolerance during self care tasks. Pt greeted supine in bed, reported back pain however already medicated. He transitioned to EOB with Max A, required assist with guiding bilateral LEs off of bed. Per report back pain was not bad EOB while he brushed teeth, requiring min vcs for scanning and sequencing during task. Squat pivot<w/c completed with 2 helpers for safety, with OT providing Mod-Max A and blocking Lt knee (2nd helper guiding hips towards chair). He completed bathing/dressing w/c level at sink. 1 helper for sit<stand Max A, with manual facilitation for forward weight shift and upright alignment, while 2nd helper completed perihygiene and elevated LB garments over hips as needed. Pt actively helping throughout session, but required overall Max A due to global weakness and UE ROM deficits. At end of session pt was placed at bedside with safety belt fastened and all needs within reach. L UE elevated on pillow. Educated him to complete LE marches/movements to decrease LE edema due to pt declining having LEs propped up on bed.   Therapy Documentation Precautions:  Precautions Precautions:  Fall Precaution Comments: weak left knee (had problems with it before) Restrictions Weight Bearing Restrictions: No Vital Signs: Therapy Vitals BP: (!) 99/57 Patient Position (if appropriate): Sitting Oxygen Therapy SpO2: 90 % Pain: Back pain. RN made aware.  Pain Assessment Pain Assessment: 0-10 Pain Score: 2  Pain Type: Acute pain Pain Location: Leg Pain Orientation: (back) Pain Descriptors / Indicators: Aching Pain Frequency: Constant Pain Onset: On-going Pain Intervention(s): Medication (See eMAR) ADL: ADL ADL Comments: see functional navigator     See Function Navigator for Current Functional Status.   Therapy/Group: Individual Therapy  Deneka Greenwalt A Levorn Oleski 12/19/2017, 11:51 AM

## 2017-12-19 NOTE — Plan of Care (Signed)
  RH BOWEL ELIMINATION RH STG MANAGE BOWEL WITH ASSISTANCE Description STG Manage Bowel with  Mod Assistance. Constipation LBM 3/1 per patient yesterday was his good meal 12/19/2017 1115 - Not Progressing by Ander Slade, RN   RH BLADDER ELIMINATION RH STG MANAGE BLADDER WITH ASSISTANCE Description STG Manage Bladder With max Assistance ; chronic foley use ; dialysis patient 12/19/2017 1115 - Not Progressing by Ander Slade, RN   RH PAIN MANAGEMENT RH STG PAIN MANAGED AT OR BELOW PT'S PAIN GOAL Description Pain <4 with min assistance ; claims of increasing pain leg; back; neck 12/19/2017 1115 - Progressing by Ander Slade, RN

## 2017-12-19 NOTE — Progress Notes (Signed)
LBM 12/10/17, patient has taken multi PRN laxatives, without results. BS (+) X 4 quads. Doesn't feel constipated. Up to [facility] X 1 this shift. Foley in place with zero output. PRN percocet given at HS for c/o of LLE pain. Rested quietly during night. Edema all over. Bilateral heels boggy, refuses to have elevated off bed, reports too painful. [name] A

## 2017-12-19 NOTE — Progress Notes (Addendum)
Patient was up on the chair for therapy.c/o dizziness after therapy. Vital signs done. Patient was transferred back in bed and claims he feels much better no complaints of dizziness. Continued to monitor.

## 2017-12-20 ENCOUNTER — Inpatient Hospital Stay (HOSPITAL_COMMUNITY): Payer: Medicare Other | Admitting: Physical Therapy

## 2017-12-20 ENCOUNTER — Inpatient Hospital Stay (HOSPITAL_COMMUNITY): Payer: Medicare Other | Admitting: Occupational Therapy

## 2017-12-20 ENCOUNTER — Inpatient Hospital Stay (HOSPITAL_COMMUNITY): Payer: Medicare Other | Admitting: Speech Pathology

## 2017-12-20 DIAGNOSIS — I82411 Acute embolism and thrombosis of right femoral vein: Secondary | ICD-10-CM

## 2017-12-20 LAB — GLUCOSE, CAPILLARY
GLUCOSE-CAPILLARY: 129 mg/dL — AB (ref 65–99)
GLUCOSE-CAPILLARY: 177 mg/dL — AB (ref 65–99)
Glucose-Capillary: 129 mg/dL — ABNORMAL HIGH (ref 65–99)
Glucose-Capillary: 136 mg/dL — ABNORMAL HIGH (ref 65–99)

## 2017-12-20 NOTE — Progress Notes (Signed)
Occupational Therapy Session Note  Patient Details  Name: Albert Stanley MRN: [id number] Date of Birth: 03-17-1946  Today's Date: 12/20/2017 OT Individual Time: 1012(make up time)-1045 OT Individual Time Calculation (min): 33 min    Short Term Goals: Week 1:  OT Short Term Goal 1 (Week 1): Pt will transfer to BSC/ toilet with mod A +1  OT Short Term Goal 2 (Week 1): Pt will perform sit to stands in prep for clothing mangement with mod A  OT Short Term Goal 3 (Week 1): Pt will self feed after setup with supervision  OT Short Term Goal 4 (Week 1): Pt maintain dynamic sitting balance with supervision during functional tasks  Skilled Therapeutic Interventions/Progress Updates:    Pt transitioned easily from previous therapy session without issue. RN arrived to remove staples during this session as well. Pt required total A to don button down shirt this session. Pt also exhibiting increased fatigue at this point with increased rest breaks needed. OT assisted pt to sink via wheelchair for grooming task with set up A to obtain all needed items. Pt remained seated in wheelchair at end of session with quick release belt donned and call bell within reach upon exiting the room.   Therapy Documentation Precautions:  Precautions Precautions: Fall Precaution Comments: weak left knee (had problems with it before) Restrictions Weight Bearing Restrictions: No General:   Vital Signs:   Pain: Pain Assessment Pain Score: 6  Pain Type: Acute pain Pain Location: Leg Pain Orientation: Left Pain Descriptors / Indicators: Aching;Discomfort Pain Intervention(s): Medication (See eMAR) ADL: ADL ADL Comments: see functional navigator Vision   Perception    Praxis   Exercises:   Other Treatments:    See Function Navigator for Current Functional Status.   Therapy/Group: Individual Therapy  Gypsy Decant 12/20/2017, 10:46 AM

## 2017-12-20 NOTE — Progress Notes (Signed)
[place] PHYSICAL MEDICINE & REHABILITATION     PROGRESS NOTE    Subjective/Complaints: Up at EOB working with SLP. Head "itching". No problems over weekend. Happy about diet  ROS: Patient denies rever, rash, sore throat, blurred vision, nausea, vomiting, diarrhea, cough, shortness of breath or chest pain, joint or back pain, headache, or mood change.   Objective: Vital Signs: Blood pressure 124/64, pulse 79, temperature 98.4 F (36.9 C), temperature source Oral, resp. rate 16, weight 93 kg (205 lb 0.4 oz), SpO2 100 %. Ir Ivc Filter Plmt / S&i /img Guid/mod Sed  Result Date: 12/18/2017 CLINICAL DATA:  Bilateral DVT. Subdural hematoma, a relative contraindication anticoagulation. Caval filtration is requested. EXAM: INFERIOR VENACAVOGRAM IVC FILTER PLACEMENT UNDER FLUOROSCOPY FLUOROSCOPY TIME:  0.7 minute; 81 uGym2 DAP TECHNIQUE: The procedure, risks (including but not limited to bleeding, infection, organ damage ), benefits, and alternatives were explained to the patient. Questions regarding the procedure were encouraged and answered. The patient understands and consents to the procedure. Ultrasound demonstrates thrombus in the right IJ vein. Patency of the right external jugular vein was confirmed with ultrasound with image documentation. An appropriate skin site was determined. Skin site was marked, prepped with chlorhexidine, and draped using maximum barrier technique. The region was infiltrated locally with 1% lidocaine. Intravenous Fentanyl administered for pain control during continuous monitoring of the patient's level of consciousness and physiological / cardiorespiratory status by the radiology RN, with a total of survey shin time of 10 minutes. Under real-time ultrasound guidance, the right EJ vein was accessed with a 21 gauge micropuncture needle; the needle tip within the vein was confirmed with ultrasound image documentation. The needle was exchanged over a 018 guidewire for a  transitional dilator, which allow advancement of the [facility] wire into the IVC. A long 6 French vascular sheath was placed for inferior venacavography. This demonstrated no caval thrombus. Renal vein inflows were evident. The [facility] IVC filter was advanced through the sheath and successfully deployed under fluoroscopy at the L2 level. Followup cavagram demonstrates stable filter position and no evident complication. The sheath was removed and hemostasis achieved at the site. No immediate complication. IMPRESSION: 1. Normal IVC. No thrombus or significant anatomic variation. 2. Technically successful infrarenal IVC filter placement. This is a retrievable model. PLAN: This IVC filter is potentially retrievable. The patient will be assessed for filter retrieval by Interventional Radiology in approximately 8-12 weeks. Further recommendations regarding filter retrieval, continued surveillance or declaration of device permanence, will be made at that time. Electronically Signed   By: Lucrezia Europe M.D.   On: 12/18/2017 12:17   Recent Labs    12/18/17 1914  WBC 7.5  HGB 9.0*  HCT 28.3*  PLT 215   Recent Labs    12/18/17 1914  NA 132*  K 3.4*  CL 94*  GLUCOSE 142*  BUN 20  CREATININE 3.82*  CALCIUM 7.9*   CBG (last 3)  Recent Labs    12/19/17 1635 12/19/17 2116 12/20/17 0636  GLUCAP 178* 113* 129*    Wt Readings from Last 3 Encounters:  12/20/17 93 kg (205 lb 0.4 oz)  12/16/17 91.5 kg (201 lb 11.5 oz)  12/04/17 91.3 kg (201 lb 4.5 oz)    Physical Exam:  Constitutional. Patient is alert. NAD. Vital signs reviewed.  HEENT. Craniotomy site clean and dry with staples Eyes. EOMI. No discharge. Cardiovascular:RRR without murmur. No JVD  Respiratory: CTA Bilaterally without wheezes or rales. Normal effort  GI: BS +, non-distended  Skin. Warm, .  Neurological: He is alert and oriented Fair insight and awareness  Motor: RUE: 5/5 proximal to distal LUE: 3/5 proximal to distal RLE: 3-/5 HF,  KE, 4-/5 ADF LLE: HF 2-/5, KE 2+/5, 2+ ADF, limited somewhat by left knee (oa/valgus def) Psych: pleasant  Assessment/Plan: 1. Functional deficits and left HP secondary to right SDH which require 3+ hours per day of interdisciplinary therapy in a comprehensive inpatient rehab setting. Physiatrist is providing close team supervision and 24 hour management of active medical problems listed below. Physiatrist and rehab team continue to assess barriers to discharge/monitor patient progress toward functional and medical goals.  Function:  Bathing Bathing position   Position: Wheelchair/chair at sink  Bathing parts Body parts bathed by patient: Chest, Abdomen, Front perineal area, Left arm Body parts bathed by helper: Buttocks, Right upper leg, Left upper leg, Right lower leg, Left lower leg, Back, Right arm  Bathing assist Assist Level: 2 helpers      Upper Body Dressing/Undressing Upper body dressing   What is the patient wearing?: Button up shirt       Pull over shirt/dress - Perfomed by helper: Thread/unthread right sleeve, Thread/unthread left sleeve, Put head through opening, Pull shirt over trunk   Button up shirt - Perfomed by helper: Thread/unthread right sleeve, Thread/unthread left sleeve, Pull shirt around back, Button/unbutton shirt    Upper body assist        Lower Body Dressing/Undressing Lower body dressing   What is the patient wearing?: Pants, Non-skid slipper socks, Underwear   Underwear - Performed by helper: Thread/unthread right underwear leg, Thread/unthread left underwear leg, Pull underwear up/down   Pants- Performed by helper: Thread/unthread right pants leg, Thread/unthread left pants leg, Pull pants up/down   Non-skid slipper socks- Performed by helper: Don/doff right sock, Don/doff left sock                  Lower body assist Assist for lower body dressing: 2 Helpers      Naval architect activity did not occur: No continent  bowel/bladder event        Toileting assist     Transfers Chair/bed transfer   Chair/bed transfer method: Lateral scoot Chair/bed transfer assist level: Maximal assist (Pt 25 - 49%/lift and lower) Chair/bed transfer assistive device: Bedrails, Armrests     Locomotion Ambulation Ambulation activity did not occur: Safety/medical concerns         Wheelchair     Max wheelchair distance: 25 ft Assist Level: Touching or steadying assistance (Pt > 75%)  Cognition Comprehension Comprehension assist level: Understands complex 90% of the time/cues 10% of the time  Expression Expression assist level: Expresses complex 90% of the time/cues < 10% of the time  Social Interaction Social Interaction assist level: Interacts appropriately 90% of the time - Needs monitoring or encouragement for participation or interaction.  Problem Solving Problem solving assist level: Solves basic 90% of the time/requires cueing < 10% of the time  Memory Memory assist level: Recognizes or recalls 75 - 89% of the time/requires cueing 10 - 24% of the time   Medical Problem List and Plan:  1. Decreased functional mobility with left-sided weakness and dysphagia secondary to traumatic right subdural hematoma status post right frontotemporal parietal craniotomy 12/05/2017    Cont CIR   -no changes seen on physical exam.  2. DVT Prophylaxis/Anticoagulation: SCDs.    Dopplers suggesting right femoral and posterior tibial DVT and left peroneal DVT - status post IVC filter placement on 3/9---no issues  3. Pain Management: Oxycodone as needed  4. Mood: Seroquel 25 mg twice a day  5. Neuropsych: This patient is capable of making decisions on his own behalf.  6. Skin/Wound Care: Routine skin checks    -remove scalp staples today 7. Fluids/Electrolytes/Nutrition: Routine I&O's    -encourage PO    -Diet upgraded to regular texture diet on 3/8 with good results 8. End-stage renal disease. Continue hemodialysis as per renal  services. Status post arteriovenous Gore-Tex graft left arm 12/15/2017  -HD scheduled after therapies to maximize therapy time/performance  9. Diabetes mellitus and peripheral neuropathy. Latest hemoglobin A1c 6.4. SSI. Check blood sugars before meals and at bedtime    Fair control at present with occasional elevations into 150's-170's---no changes at present 10. Acute on chronic anemia. Continue Aranesp.    Hemoglobin 9.0 on 3/9 11. 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 Monitor for any signs of fluid overload   Filed Weights   12/18/17 1740 12/19/17 0536 12/20/17 0240  Weight: 90.3 kg (199 lb 1.2 oz) 91 kg (200 lb 9.9 oz) 93 kg (205 lb 0.4 oz)    Volume mgt per renal 12. Hypertension. Monitor with increased mobility    Labile on 3/10, monitor for trend 13. MRSA PCR screening positive. Contact precautions  14. Dysphagia:  Advanced to regular texture diet on 3/8   LOS (Days) 4 A FACE TO FACE EVALUATION WAS PERFORMED      [name], MD 12/20/2017 8:27 AM

## 2017-12-20 NOTE — Progress Notes (Signed)
Patient currently with indwelling chronic Foley catheter tube with history of bladder cancer that is changed every 30 days. At this time continue to maintain Foley tube. Patient is making urine however dark and concentrated. Continue to monitor

## 2017-12-20 NOTE — Progress Notes (Signed)
No BM since 3-1. Pt has active bowel sounds and no discomfort to abdomen. PRN Sorbitol given, scheduled miralaxx, and senna given and PRN dulcolax 10mg  tabs given with no result. Dulcolax 10mg  tabs given Friday 3-8 with no result. Pt refusing dulcolax suppository.  Will continue to monitor bowels.

## 2017-12-20 NOTE — Progress Notes (Addendum)
Physical Therapy Session Note  Patient Details  Name: Albert Stanley MRN: [id number] Date of Birth: 03/06/1946  Today's Date: 12/20/2017 PT Individual Time: 1101-1158 PT Individual Time Calculation (min): 57 min   Short Term Goals: Week 1:  PT Short Term Goal 1 (Week 1): Pt will initiate gait training.  PT Short Term Goal 2 (Week 1): Pt will complete bed<>w/c with mod assist +1. PT Short Term Goal 3 (Week 1): Pt will propel w/c 75 ft with BUE & supervision.  Skilled Therapeutic Interventions/Progress Updates:  Pt received in w/c & agreeable to tx. Pt with c/o L knee pain with weight bearing and RN made aware - RN administered meds during session. Transported pt to gym via w/c total assist for time management. Attempted sit>stand with rail in hallway with and without mirror for visual feedback for upright posture; pt requires max assist with therapist stabilizing L knee for pt to attempt sit>stand but is unable to shift pelvis anteriorly and come to upright standing posture with pt reporting L knee pain and weakness. Pt states "I need a man to pull me up". Transitioned to standing frame with activity focusing on LLE weight bearing & activity tolerance & pt able to standing 3 minutes + 2 minutes while engaging in BUE activity focusing on L attention and to prevent pt from supporting self with BUE. Pt appears to attend to L side well throughout session. Pt required frequent breaks 2/2 pain & fatigue but at one point while standing was able to shift weight to LLE to raise R foot from floor. Pt attempted to transfer to standing in stedy but unable with +1 assist and ultimately requires max assist +2. Pt tolerated sitting unsupported in stedy for very brief periods of time while engaging in ball toss with task focusing on BUE coordination, sitting balance (pt requires close supervision<>steady assist), and activity tolerance. At end of session pt left sitting in w/c in room with QRB donned, set up with  meal tray & in care of NT.  Pt demonstrates some awareness as he recognizes he is unable to hold cups/items well with L hand but is unable to correlate them to diagnosis.   Addendum: Pt with significant internal rotation of LLE requiring assistance to achieve as neutral alignment as possible. Pt reports his L knee has been like this for 3 years since he had his hip replacement.  Therapy Documentation Precautions:  Precautions Precautions: Fall Precaution Comments: weak left knee (had problems with it before) Restrictions Weight Bearing Restrictions: No  See Function Navigator for Current Functional Status.   Therapy/Group: Individual Therapy  [name] 12/20/2017, 12:32 PM

## 2017-12-20 NOTE — Progress Notes (Signed)
Occupational Therapy Session Note  Patient Details  Name: Albert Stanley MRN: [id number] Date of Birth: 1946-06-14  Today's Date: 12/20/2017 OT Individual Time: 3888-7579 OT Individual Time Calculation (min): 71 min    Short Term Goals: Week 1:  OT Short Term Goal 1 (Week 1): Pt will transfer to BSC/ toilet with mod A +1  OT Short Term Goal 2 (Week 1): Pt will perform sit to stands in prep for clothing mangement with mod A  OT Short Term Goal 3 (Week 1): Pt will self feed after setup with supervision  OT Short Term Goal 4 (Week 1): Pt maintain dynamic sitting balance with supervision during functional tasks  Skilled Therapeutic Interventions/Progress Updates:   Pt completed shower this session with a focus on functional activity tolerance, ADL retraining, and sitting/standing balance. Pt demonstrated good improvement with bed mobility this session, requiring min A for L LE management and min A to transition supine to EOB. Vc and education provided for UE placement and facilitating performance via proper body mechanics. Pt used stedy lift to transfer EOB to shower chair, with vc required for UE placement and manual cues for initiation of sit to stand transfer. Frequent vc and tactile cues provided during seated shower to facilitate upright posture for optimal interaction with environment and functional reaching during bathing. Manual and verbal cues provided for positioning L knee in neutral d/t extensive deviation from midline.  Discussion/education provided throughout session re ECT, activity pacing, and use of A/E during ADL routine at home.   Therapy Documentation Precautions:  Precautions Precautions: Fall Precaution Comments: weak left knee (had problems with it before) Restrictions Weight Bearing Restrictions: No   Pain: Pain Assessment Pain Assessment: 0-10 Pain Score: 4  Faces Pain Scale: Hurts even more Pain Type: Chronic pain Pain Location: Knee Pain Orientation:  Left Pain Descriptors / Indicators: Aching Pain Onset: On-going Pain Intervention(s): Repositioned Multiple Pain Sites: No ADL: ADL ADL Comments: see functional navigator  See Function Navigator for Current Functional Status.   Therapy/Group: Individual Therapy  Curtis Sites 12/20/2017, 12:48 PM

## 2017-12-20 NOTE — Plan of Care (Signed)
  Not Progressing RH BOWEL ELIMINATION RH STG MANAGE BOWEL WITH ASSISTANCE Description STG Manage Bowel with  Mod Assistance.  12/20/2017 5825 - Not Progressing by Cornell Barman, RN RH STG MANAGE BOWEL W/MEDICATION W/ASSISTANCE Description STG Manage Bowel with Medication with mod Assistance.  12/20/2017 1898 - Not Progressing by Cornell Barman, RN RH STG MANAGE BOWEL W/EQUIPMENT W/ASSISTANCE Description STG Manage Bowel With Equipment With mod Assistance  12/20/2017 0318 - Not Progressing by Cornell Barman, RN RH SKIN INTEGRITY RH STG ABLE TO PERFORM INCISION/WOUND CARE W/ASSISTANCE Description STG Able To Perform Incision/Wound Care With min Assistance.  12/20/2017 0318 - Not Progressing by Cornell Barman, RN RH PAIN MANAGEMENT RH STG PAIN MANAGED AT OR BELOW PT'S PAIN GOAL Description Pain <4 with min assistance  12/20/2017 0318 - Not Progressing by Cornell Barman, RN

## 2017-12-20 NOTE — Progress Notes (Signed)
Speech Language Pathology Daily Session Note  Patient Details  Name: Albert Stanley MRN: [id number] Date of Birth: [birth year]  Today's Date: 12/20/2017 SLP Individual Time: 5885-0277 SLP Individual Time Calculation (min): 45 min  Short Term Goals: Week 1: SLP Short Term Goal 1 (Week 1): Pt will consume regular textures and thin liquids with mod I use of swallowing precautions and minimal overt s/s of aspiration.  SLP Short Term Goal 2 (Week 1): Pt will selectively attend to tasks in a moderately distracting environment for 15 minutes with supervision cues for redirection to task.   SLP Short Term Goal 3 (Week 1): Pt will complete semi-complex tasks with min verbal cues for functional problem solving.   SLP Short Term Goal 4 (Week 1): Pt will recall semi-complex daily information with min verbal cues for use of external aids.   SLP Short Term Goal 5 (Week 1): Pt will recognize and correct errors in the moment during structured tasks with min verbal cues.    Skilled Therapeutic Interventions: Skilled treatment session focused on cognitive goals. SLP facilitated session by providing Min A verbal cues for problem solving and recall during a basic money management task. Pt required supervision verbal cues for selective attention in a mildly distracting environment for ~20 minutes. Given Max verbal cues, pt able to self-monitor and correct errors throughout the task. Pt left supine in bed with alarm on and all needs within reach. Continue with current plan of care.      Function:  Cognition Comprehension Comprehension assist level: Understands basic 90% of the time/cues < 10% of the time  Expression   Expression assist level: Expresses basic 90% of the time/requires cueing < 10% of the time.  Social Interaction Social Interaction assist level: Interacts appropriately 90% of the time - Needs monitoring or encouragement for participation or interaction.  Problem Solving Problem solving assist  level: Solves basic 75 - 89% of the time/requires cueing 10 - 24% of the time  Memory Memory assist level: Recognizes or recalls 75 - 89% of the time/requires cueing 10 - 24% of the time    Pain Pain Assessment Pain Assessment: No/denies pain   Therapy/Group: Individual Therapy  Meredeth Ide  SLP - Student 12/20/2017, 10:50 AM

## 2017-12-20 NOTE — Progress Notes (Signed)
LBM 03/01, refusing dulcolax supp. "I don't feel constipated". Taking PRN and scheduled laxatives. (+) bowel sounds. Up to [facility] last night without results. Bilateral heels boggy-refusing to elevate heels-"it Hurts my legs.". Awake most of night. PRN percocet and benadryl given at hs. [name] A

## 2017-12-20 NOTE — Progress Notes (Signed)
Admit: 12/16/2017 LOS: 4  45M ESRD [facility] THS s/p Fall with SDH; DVT in LE s/p IVC Filter 3/9  Subjective:  No c/o   03/10 0701 - 03/11 0700 In: 360 [P.O.:360] Out: 0   Filed Weights   12/18/17 1740 12/19/17 0536 12/20/17 0240  Weight: 90.3 kg (199 lb 1.2 oz) 91 kg (200 lb 9.9 oz) 93 kg (205 lb 0.4 oz)    Scheduled Meds: . allopurinol  300 mg Oral Daily  . amiodarone  200 mg Oral Daily  . calcitRIOL  0.5 mcg Oral Q T,Th,Sa-HD  . darbepoetin (ARANESP) injection - DIALYSIS  150 mcg Intravenous Q Sat-HD  . feeding supplement (PRO-STAT SUGAR FREE 64)  30 mL Oral BID  . insulin aspart  0-9 Units Subcutaneous TID WC  . multivitamin  1 tablet Oral QHS  . mupirocin ointment   Nasal BID  . oxybutynin  5 mg Oral BID  . pantoprazole  40 mg Oral QHS  . polyethylene glycol  17 g Oral Daily  . senna-docusate  2 tablet Oral QHS  . sevelamer carbonate  1,600 mg Oral TID WC   Continuous Infusions: PRN Meds:.acetaminophen, albuterol, bisacodyl, bisacodyl, diphenhydrAMINE, heparin, MUSCLE RUB, ondansetron **OR** ondansetron (ZOFRAN) IV, oxyCODONE-acetaminophen, sorbitol  Current Labs: reviewed   OP Dialysis:TTS [facility] 4h 88.5kg 2/2.25 bath P4 LUA AVF Hep 7000 Venofer 50mg  IV qHD Mircera 115mcg IV q2wks - last 2/14 Calcitriol 3.67mcg PO qHD  Sensipar 30mg  qd Renvela 800mg - 2AC TID, 2 w/snacks   Physical Exam:  Blood pressure 124/64, pulse 79, temperature 98.4 F (36.9 C), temperature source Oral, resp. rate 16, weight 93 kg (205 lb 0.4 oz), SpO2 100 %. LUE with AVG, swollen, pt states improving AAO x3 Trace LEE TDC RRR w/o mumur CTAB  A 1. ESRD THS [facility] sing AVF 2. S/p Fall and SDH with craniotomy 2/24; in CIR 3. Clotted Acccess, s/p new LUE AG and L IJ TDC (3/6) 4. HTN 5. Anemia on Aranesp 150 qSat 6. CKD BMD on C3, renvela 2qAC 7. DM2 8. Chronic indwelling foley hx/o bladder cancer  P 1. HD tomorrow using TDC: 3K, 4h, target 4L UF BP permitting, no heparin   Pearson Grippe MD 12/20/2017, 9:21 AM  Recent Labs  Lab 12/14/17 1939 12/15/17 0603 12/16/17 1213 12/18/17 1914  NA 131* 131* 131* 132*  K 4.2 4.6 4.0 3.4*  CL 90* 92* 92* 94*  CO2 26 23 25 25   GLUCOSE 87 126* 127* 142*  BUN 84* 92* 48* 20  CREATININE 7.49* 8.01* 5.61* 3.82*  CALCIUM 7.9* 7.7* 7.4* 7.9*  PHOS 6.1*  --  5.4* 4.0   Recent Labs  Lab 12/14/17 1939 12/16/17 1213 12/18/17 1914  WBC 7.8 7.0 7.5  HGB 7.5* 7.8* 9.0*  HCT 22.4* 23.9* 28.3*  MCV 92.6 93.0 94.0  PLT 194 199 215

## 2017-12-21 ENCOUNTER — Inpatient Hospital Stay (HOSPITAL_COMMUNITY): Payer: Medicare Other | Admitting: Occupational Therapy

## 2017-12-21 ENCOUNTER — Inpatient Hospital Stay (HOSPITAL_COMMUNITY): Payer: Medicare Other | Admitting: Physical Therapy

## 2017-12-21 ENCOUNTER — Inpatient Hospital Stay (HOSPITAL_COMMUNITY): Payer: Medicare Other | Admitting: Speech Pathology

## 2017-12-21 LAB — RENAL FUNCTION PANEL
ALBUMIN: 2.5 g/dL — AB (ref 3.5–5.0)
ANION GAP: 14 (ref 5–15)
BUN: 44 mg/dL — ABNORMAL HIGH (ref 6–20)
CALCIUM: 7.6 mg/dL — AB (ref 8.9–10.3)
CO2: 23 mmol/L (ref 22–32)
Chloride: 94 mmol/L — ABNORMAL LOW (ref 101–111)
Creatinine, Ser: 8.08 mg/dL — ABNORMAL HIGH (ref 0.61–1.24)
GFR, EST AFRICAN AMERICAN: 7 mL/min — AB (ref >60)
GFR, EST NON AFRICAN AMERICAN: 6 mL/min — AB (ref >60)
Glucose, Bld: 155 mg/dL — ABNORMAL HIGH (ref 65–99)
PHOSPHORUS: 5.4 mg/dL — AB (ref 2.5–4.6)
POTASSIUM: 3.8 mmol/L (ref 3.5–5.1)
SODIUM: 131 mmol/L — AB (ref 135–145)

## 2017-12-21 LAB — CBC
HEMATOCRIT: 26.4 % — AB (ref 39.0–52.0)
HEMOGLOBIN: 8.4 g/dL — AB (ref 13.0–17.0)
MCH: 29.7 pg (ref 26.0–34.0)
MCHC: 31.8 g/dL (ref 30.0–36.0)
MCV: 93.3 fL (ref 78.0–100.0)
Platelets: 202 10*3/uL (ref 150–400)
RBC: 2.83 MIL/uL — ABNORMAL LOW (ref 4.22–5.81)
RDW: 17.9 % — ABNORMAL HIGH (ref 11.5–15.5)
WBC: 8.4 10*3/uL (ref 4.0–10.5)

## 2017-12-21 LAB — GLUCOSE, CAPILLARY
GLUCOSE-CAPILLARY: 112 mg/dL — AB (ref 65–99)
GLUCOSE-CAPILLARY: 218 mg/dL — AB (ref 65–99)
Glucose-Capillary: 142 mg/dL — ABNORMAL HIGH (ref 65–99)
Glucose-Capillary: 107 mg/dL — ABNORMAL HIGH (ref 65–99)

## 2017-12-21 MED ORDER — GLIPIZIDE 5 MG PO TABS
2.5000 mg | ORAL_TABLET | Freq: Every day | ORAL | Status: DC
  Administered 2017-12-21 – 2018-01-02 (×13): 2.5 mg via ORAL
  Filled 2017-12-21 (×13): qty 1

## 2017-12-21 NOTE — Progress Notes (Signed)
Social Work  Social Work Assessment and Plan  Patient Details  Name: Albert Stanley MRN: [id number] Date of Birth: Apr 11, 1946  Today's Date: 12/17/2017  Problem List:  Patient Active Problem List   Diagnosis Date Noted  . Labile blood pressure   . Essential hypertension   . Acute on chronic diastolic heart failure ([place])   . Type 2 diabetes mellitus with peripheral neuropathy ([name])   . Acute deep vein thrombosis (DVT) of femoral vein of right lower extremity ([place])   . Traumatic subdural hematoma ([place]) 12/16/2017  . ESRD (end stage renal disease) ([place])   . ESRD (end stage renal disease) on dialysis ([place])   . Dysphagia   . Acute blood loss anemia   . Anemia of chronic disease   . Chronic diastolic congestive heart failure ([place])   . Diabetes mellitus type 2 in nonobese ([name])   . Benign essential HTN   . Nonischemic cardiomyopathy ([place])   . Transaminitis   . Encounter for central line placement   . Endotracheally intubated   . SDH (subdural hematoma) ([place])   . Ventilator dependent ([place])   . Chronic renal impairment   . Chronic in-center hemodialysis status ([place])   . S/P craniotomy 12/05/2017  . Hyperkalemia 12/02/2017  . Malnutrition of moderate degree 12/18/2016  . PVC's (premature ventricular contractions)   . Anemia associated with chronic renal failure   . End-stage renal disease on hemodialysis ([place]) 12/09/2016  . Dyslipidemia associated with type 2 diabetes mellitus ([place]) 12/09/2016  . Acute on chronic systolic and diastolic heart failure, NYHA class 1 ([place]) 12/09/2016  . 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 Type 2 diabetes mellitus ([place]) 12/11/2015  . Fluid overload 12/06/2015  . History of DVT (deep vein thrombosis) 12/04/2015  . Renal hematoma 12/04/2015  . H/O: gout 11/16/2013  . Trochanteric bursitis 10/17/2013  . 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 Lumbar degenerative disc disease 07/17/2013   Past  Medical History:  Past Medical History:  Diagnosis Date  . Arthritis    Osteoarthritis  . Asthma   . Bladder cancer ([place]) dx'd 1990   surg only  . CHF (congestive heart failure) ([place])   . Diabetes mellitus without complication ([place])   . ESRD (end stage renal disease) ([name])    Tues, Thurs, Sat dialysis  . GERD (gastroesophageal reflux disease)   . History of cardiac catheterization    a. LHC 5/17: no obstructive CAD  . History of DVT (deep vein thrombosis)    2003 ish  . History of nuclear stress test    a. Myoview 4/17: EF 39%, inf, inf-lat, apical inf, apical lat, apical scar, intermediate risk  . Hyperlipidemia   . Hypertension   . NICM (nonischemic cardiomyopathy) ([place])    a. Echo 2/17: mod LVH, EF 35-40%, inf-lat and inf-sept HK, mod MR, severe LAE, small pericardial effusion   Past Surgical History:  Past Surgical History:  Procedure Laterality Date  . arthroscopic knee surgery Left   . AV FISTULA PLACEMENT Left 12/13/2015   Procedure: ARTERIOVENOUS (AV) FISTULA CREATION;  Surgeon: Angelia Mould, MD;  Location: [facility] OR;  Service: Vascular;  Laterality: Left;  . AV FISTULA PLACEMENT Left 12/15/2017   Procedure: INSERTION OF ARTERIOVENOUS (AV) GORE-TEX GRAFT ARM;  Surgeon: Serafina Mitchell, MD;  Location: [place];  Service: Vascular;  Laterality: Left;  . BACK SURGERY     2 times  1960  . CARDIAC CATHETERIZATION N/A  02/28/2016   Procedure: Left Heart Cath and Coronary Angiography;  Surgeon: Larey Dresser, MD;  Location: [place] CV LAB;  Service: Cardiovascular;  Laterality: N/A;  . CRANIOTOMY N/A 12/05/2017   Procedure: CRANIOTOMY HEMATOMA EVACUATION SUBDURAL;  Surgeon: Ashok Pall, MD;  Location: [place];  Service: Neurosurgery;  Laterality: N/A;  . CYSTOSCOPY    . FISTULA SUPERFICIALIZATION Left 08/14/2016   Procedure: FISTULA SUPERFICIALIZATION LEFT UPPER ARM;  Surgeon: Angelia Mould, MD;  Location: [place];  Service: Vascular;  Laterality: Left;  . INSERTION  OF DIALYSIS CATHETER N/A 12/13/2015   Procedure: INSERTION OF DIALYSIS CATHETER;  Surgeon: Angelia Mould, MD;  Location: [place];  Service: Vascular;  Laterality: N/A;  . INSERTION OF DIALYSIS CATHETER Left 05/07/2017   Procedure: INSERTION OF DIALYSIS CATHETER LEFT INTERNAL JUGULAR PLACEMENT;  Surgeon: Angelia Mould, MD;  Location: [place];  Service: Vascular;  Laterality: Left;  . INSERTION OF DIALYSIS CATHETER Left 12/15/2017   Procedure: INSERTION OF DIALYSIS CATHETER;  Surgeon: Serafina Mitchell, MD;  Location: [facility] OR;  Service: Vascular;  Laterality: Left;  . IR IVC FILTER PLMT / S&I Burke Keels GUID/MOD SED  12/18/2017  . IR RADIOLOGIST EVAL & MGMT  05/12/2017  . JOINT REPLACEMENT Left    hip  . LIGATION OF COMPETING BRANCHES OF ARTERIOVENOUS FISTULA Left 08/14/2016   Procedure: LIGATION OF COMPETING BRANCHES OF ARTERIOVENOUS FISTULA LEFT UPPER ARM;  Surgeon: Angelia Mould, MD;  Location: [place];  Service: Vascular;  Laterality: Left;  . PERIPHERAL VASCULAR CATHETERIZATION Left 06/09/2016   Procedure: Fistulagram;  Surgeon: Serafina Mitchell, MD;  Location: [place] [facility] LAB;  Service: Cardiovascular;  Laterality: Left;  ARM  . PERIPHERAL VASCULAR CATHETERIZATION Left 06/09/2016   Procedure: Peripheral Vascular Balloon Angioplasty;  Surgeon: Serafina Mitchell, MD;  Location: [place] CV LAB;  Service: Cardiovascular;  Laterality: Left;  upper arm venous  . PERIPHERAL VASCULAR CATHETERIZATION Left 06/25/2016   Procedure: Fistulagram;  Surgeon: Serafina Mitchell, MD;  Location: [place] CV LAB;  Service: Cardiovascular;  Laterality: Left;  . REVISON OF ARTERIOVENOUS FISTULA Left 05/07/2017   Procedure: REPAIR OF PSEUDOANEURYSM  LEFT BRACHIO-CEPHALIC  ARM ARTERIOVENOUS FISTULA;  Surgeon: Angelia Mould, MD;  Location: [place];  Service: Vascular;  Laterality: Left;   Social History:  reports that he quit smoking about 19 years ago. He quit after 30.00 years of use. he has never used  smokeless tobacco. He reports that he does not drink alcohol or use drugs.  Family / Support Systems Marital Status: Married Patient Roles: Spouse, Parent Spouse/Significant Other: wife, Albert Stanley @ (H) [phone] or (C) [phone] Children: daughter, Albert Stanley (lives in the home and works p/t) @ (C) [phone] and another daughter livingin [place] Anticipated Caregiver: Spouse  Ability/Limitations of Caregiver: Spouse has worked at [employer] for 6 months and will need to arrange home support. Caregiver Availability: Other (Comment)(wife aware pt will need 24/7 support) Family Dynamics: Pt describes good support from family .  Wife very concerned for pt and encouraging him in therapy.  Social History Preferred language: English Religion: Baptist Cultural Background: NA Read: Yes Write: Yes Employment Status: Retired Freight forwarder Issues: None Guardian/Conservator: None - per MD, pt is capable of making decisions on his own behalf.   Abuse/Neglect Abuse/Neglect Assessment Can Be Completed: Yes Physical Abuse: Denies Verbal Abuse: Denies Sexual Abuse: Denies Exploitation of patient/patient's resources: Denies Self-Neglect: Denies  Emotional Status Pt's affect, behavior adn adjustment status: Pt agreeable with  doing assessment interview, however, he states he is very fatigued from morning therapies. Several c/o food and wanting "food with protein in it."  Pt denies any significant emotional distress - will monitor and refer for neuropsychology as indicated. Recent Psychosocial Issues: None Pyschiatric History: None Substance Abuse History: None  Patient / Family Perceptions, Expectations & Goals Pt/Family understanding of illness & functional limitations: Pt able to report that he "hit my head a few days before this (pointing to surgery site)" and he understands he had a hemorrhage.  Wife with good understanding of medical issues as well and of current  functional limitations/ need for CIR. Premorbid pt/family roles/activities: Pt was independent PTA overall, however, family did assist some with transportation and med management. Anticipated changes in roles/activities/participation: Per therapy goals, family will need to provide 24/7 caregiver, "hands on" support. Pt/family expectations/goals: "I hope I get home soon." per pt  [employer]: Other (Comment)(HD at [facility]) Premorbid Home Care/DME Agencies: None Transportation available at discharge: yes Resource referrals recommended: Neuropsychology  Discharge Planning Living Arrangements: Spouse/significant other, Children Support Systems: Spouse/significant other, Children, Social worker community, Friends/neighbors Type of Residence: Private residence Insurance Resources: Medicare(UHC Medicare) Financial Resources: [place] Referred: No Living Expenses: Own Money Management: Spouse Does the patient have any problems obtaining your medications?: No Home Management: mostly wife and daughter Patient/Family Preliminary Plans: Pt to return home with wife and daughter.  Wife aware she will need to arrange for 24/7 caregiver support. Social Work Anticipated Follow Up Needs: HH/OP Expected length of stay: 21-24 days  Clinical Impression Pleasant, elderly gentleman here following a fall at home and resulting SDH, s/p craniotomy.  Pt and family with good understanding of need for CIR and anticipated assistance needs at d/c.  Pt denies any emotional distress, however, will monitor while here.  SW to follow for support and d/c planning needs.  Albert Stanley 12/17/2017, 11:33 AM

## 2017-12-21 NOTE — Progress Notes (Signed)
Speech Language Pathology Daily Session Note  Patient Details  Name: Albert Stanley MRN: [id number] Date of Birth: Oct 16, 1945  Today's Date: 12/21/2017 SLP Individual Time: 0730-0830 SLP Individual Time Calculation (min): 60 min  Short Term Goals: Week 1: SLP Short Term Goal 1 (Week 1): Pt will consume regular textures and thin liquids with mod I use of swallowing precautions and minimal overt s/s of aspiration.  SLP Short Term Goal 2 (Week 1): Pt will selectively attend to tasks in a moderately distracting environment for 15 minutes with supervision cues for redirection to task.   SLP Short Term Goal 3 (Week 1): Pt will complete semi-complex tasks with min verbal cues for functional problem solving.   SLP Short Term Goal 4 (Week 1): Pt will recall semi-complex daily information with min verbal cues for use of external aids.   SLP Short Term Goal 5 (Week 1): Pt will recognize and correct errors in the moment during structured tasks with min verbal cues.    Skilled Therapeutic Interventions: Skilled treatment session focused on dysphagia and cognitive goals. Upon arrival, pt was consuming breakfast upright in bed. Pt consumed regular textures with thin liquids without overt s/s of aspiration and demonstrated use of swallowing compensatory strategies with Mod I. SLP further facilitated session by providing Mod A verbal cues for problem solving during a mildly complex picture sequencing task. Pt required Max A verbal cues to self-monitor and correct errors during the task. Pt demonstrated selective attention in a mildly distracting environment for ~20 minutes with Supervision verbal cues for redirection. Pt left upright in wheelchair with alarm on and quick release belt in place. Continue with current plan of care.      Function:  Eating Eating   Modified Consistency Diet: No Eating Assist Level: Swallowing techniques: self managed;No help, No cues           Cognition Comprehension  Comprehension assist level: Understands basic 90% of the time/cues < 10% of the time  Expression   Expression assist level: Expresses basic 90% of the time/requires cueing < 10% of the time.  Social Interaction Social Interaction assist level: Interacts appropriately with others with medication or extra time (anti-anxiety, antidepressant).  Problem Solving Problem solving assist level: Solves basic 75 - 89% of the time/requires cueing 10 - 24% of the time  Memory Memory assist level: Recognizes or recalls 75 - 89% of the time/requires cueing 10 - 24% of the time    Pain Pain Assessment Pain Assessment: No/denies pain   Therapy/Group: Individual Therapy  Meredeth Ide  SLP - Student 12/21/2017, 12:05 PM

## 2017-12-21 NOTE — Progress Notes (Signed)
Patient awake verbalizing he "want to get OOB to get  ready for therapy that will be coming shortly", oriented to time ,patient is oriented x3, Assisted .OOB to [facility] x 2 staff (RN/NT) via Clarise Cruz lift, tolerated well, No BM but passing flatus, continue to refuse laxative (po and suppository) at this time.. Assisted back to bed, back rub provided, po fluids provided, repositioned for comfort, made as comfortable as possible. Left arm elevated Bed alarm on, SR up and monitored

## 2017-12-21 NOTE — Progress Notes (Signed)
Physical Therapy Session Note  Patient Details  Name: Albert Stanley MRN: [id number] Date of Birth: June 02, 1946  Today's Date: 12/21/2017 PT Individual Time: 6553-7482 PT Individual Time Calculation (min): 55 min   Short Term Goals: Week 1:  PT Short Term Goal 1 (Week 1): Pt will initiate gait training.  PT Short Term Goal 2 (Week 1): Pt will complete bed<>w/c with mod assist +1. PT Short Term Goal 3 (Week 1): Pt will propel w/c 75 ft with BUE & supervision.  Skilled Therapeutic Interventions/Progress Updates:  Pt received in w/c & agreeable to tx. Transported pt to gym via w/c total assist for time management. Pt completes w/c>mat table transfer via squat pivot with mod assist with manual facilitation for anterior weight shifting to achieve head/hips relationship and for pivoting portion. Pt with improved ability to push to assist with transfer on this date. Pt then worked on sit>stand transfers and standing tolerance with parallel bar. Pt requires max cuing for hand placement (LUE on bar, RUE to push from mat), LE placement and blocking at L knee to prevent buckling and facilitation to achieve maximum neutral alignment. Pt requires max assist to transfer to standing from elevated mat and max cuing for upright posture instead of leaning on elbows. Pt tolerated standing 30-60 seconds max at one time, x 3 trials before fatigue and inability to stand. While standing pt able to lift LLE but unable to weight shift to L to pick up RLE, pt reports "it aint going to happen" 2/2 pain. Pt then performed BUE strengthening exercises (overhead press, chest press, and bicep curls) with 2# weighted bar with frequent rest breaks 2/2 fatigue. Pt propels w/c with BUE x 10 ft with significantly extra time. At end of session pt left sitting in w/c in room with QRB & chair alarm donned, call bell within reach & set up with meal tray.   Pt reports he is going to buy a Judson Roch Plus to use at home; therapist educated him  on need to increase independence as much as possible prior to d/c.   Therapy Documentation Precautions:  Precautions Precautions: Fall Precaution Comments: weak left knee (had problems with it before) Restrictions Weight Bearing Restrictions: No  Pain: C/o 9/10 pain in L knee and pain in low back at rest, c/o increased L knee pain with weight bearing - pt premedicated & rest breaks provided PRN.  See Function Navigator for Current Functional Status.   Therapy/Group: Individual Therapy  [name] 12/21/2017, 12:12 PM

## 2017-12-21 NOTE — Progress Notes (Signed)
Admit: 12/16/2017 LOS: 5  14M ESRD [facility] THS s/p Fall with SDH; DVT in LE s/p IVC Filter 3/9  Subjective:  No c/o; ready for dialysis  03/11 0701 - 03/12 0700 In: 800 [P.O.:800] Out: 0   Filed Weights   12/19/17 0536 12/20/17 0240 12/21/17 0259  Weight: 91 kg (200 lb 9.9 oz) 93 kg (205 lb 0.4 oz) 93 kg (205 lb 0.4 oz)    Scheduled Meds: . allopurinol  300 mg Oral Daily  . amiodarone  200 mg Oral Daily  . calcitRIOL  0.5 mcg Oral Q T,Th,Sa-HD  . darbepoetin (ARANESP) injection - DIALYSIS  150 mcg Intravenous Q Sat-HD  . feeding supplement (PRO-STAT SUGAR FREE 64)  30 mL Oral BID  . glipiZIDE  2.5 mg Oral QAC breakfast  . insulin aspart  0-9 Units Subcutaneous TID WC  . multivitamin  1 tablet Oral QHS  . mupirocin ointment   Nasal BID  . oxybutynin  5 mg Oral BID  . pantoprazole  40 mg Oral QHS  . polyethylene glycol  17 g Oral Daily  . senna-docusate  2 tablet Oral QHS  . sevelamer carbonate  1,600 mg Oral TID WC   Continuous Infusions: PRN Meds:.acetaminophen, albuterol, bisacodyl, bisacodyl, diphenhydrAMINE, heparin, MUSCLE RUB, ondansetron **OR** ondansetron (ZOFRAN) IV, oxyCODONE-acetaminophen, sorbitol  Current Labs: reviewed   OP Dialysis:TTS [facility] 4h 88.5kg 2/2.25 bath P4 LUA AVF Hep 7000 Venofer 50mg  IV qHD Mircera 149mcg IV q2wks - last 2/14 Calcitriol 3.1mcg PO qHD  Sensipar 30mg  qd Renvela 800mg - 2AC TID, 2 w/snacks   Physical Exam:  Blood pressure 124/74, pulse 86, temperature 98.1 F (36.7 C), temperature source Axillary, resp. rate 18, weight 93 kg (205 lb 0.4 oz), SpO2 100 %. LUE with AVG, swollen, pt states improving AAO x3 Trace LEE TDC RRR w/o mumur CTAB  A 1. ESRD THS [facility] sing AVF 2. S/p Fall and SDH with craniotomy 2/24; in CIR 3. Clotted Acccess, s/p new LUE AG and L IJ TDC (3/6) 4. HTN 5. Anemia on Aranesp 150 qSat 6. CKD BMD on C3, renvela 2qAC 7. DM2 8. Chronic indwelling foley hx/o bladder cancer  P 1. HD today using TDC:  3K, 4h, target 4L UF BP permitting, no heparin   Pearson Grippe MD 12/21/2017, 1:49 PM  Recent Labs  Lab 12/14/17 1939 12/15/17 0603 12/16/17 1213 12/18/17 1914  NA 131* 131* 131* 132*  K 4.2 4.6 4.0 3.4*  CL 90* 92* 92* 94*  CO2 26 23 25 25   GLUCOSE 87 126* 127* 142*  BUN 84* 92* 48* 20  CREATININE 7.49* 8.01* 5.61* 3.82*  CALCIUM 7.9* 7.7* 7.4* 7.9*  PHOS 6.1*  --  5.4* 4.0   Recent Labs  Lab 12/14/17 1939 12/16/17 1213 12/18/17 1914  WBC 7.8 7.0 7.5  HGB 7.5* 7.8* 9.0*  HCT 22.4* 23.9* 28.3*  MCV 92.6 93.0 94.0  PLT 194 199 215

## 2017-12-21 NOTE — Progress Notes (Signed)
Occupational Therapy Session Note  Patient Details  Name: Albert Stanley MRN: [id number] Date of Birth: 1945-10-21  Today's Date: 12/21/2017 OT Individual Time: 2355-7322 OT Individual Time Calculation (min): 74 min    Short Term Goals: Week 1:  OT Short Term Goal 1 (Week 1): Pt will transfer to BSC/ toilet with mod A +1  OT Short Term Goal 2 (Week 1): Pt will perform sit to stands in prep for clothing mangement with mod A  OT Short Term Goal 3 (Week 1): Pt will self feed after setup with supervision  OT Short Term Goal 4 (Week 1): Pt maintain dynamic sitting balance with supervision during functional tasks  Skilled Therapeutic Interventions/Progress Updates:     Upon entry to room pt seated in w/c. Pt seen for morning ADL routine in room, with a focus on functional reaching during LB dressing/bathing, postural support/control in standing, and sequencing UB dressing/bathing. Pt required heavy manual cues during sit to stand transfer at L knee to prevent deviations from midline. Vc and tactile cues provided for UE placement and self-correction of upright posture in standing. Pt required tactile, manual and verbal cues to sequence donning shirt seated in w/c. Verbal/tactile cues provided for buttoning shirt and pt requiring max A to fasten shirt at midline.Discussion with pt re ECT during self care routine, as well as strategies to complete LB dressing more I. Vc provided for Ox4, with pt demonstrating increased confusion per nursing notes. Pt completed toilet transfer using SARA lift, requiring total A for standing level peri-hygiene, with pt attempting to participate in clothing management. Pt left in room in w/c with quick release belt, chair alarm, and call bell/phone within reach. Nursing notified that pt's catheter bag had a tear and was leaking.   Therapy Documentation Precautions:  Precautions Precautions: Fall Precaution Comments: weak left knee (had problems with it  before) Restrictions Weight Bearing Restrictions: No Vital Signs: BP: 127/61   Pain: Pain Assessment Pain Assessment: Faces Pain Score: 9  Faces Pain Scale: Hurts little more Pain Type: Chronic pain Pain Location: Knee Pain Orientation: Left Pain Radiating Towards: left side Pain Descriptors / Indicators: Aching Pain Frequency: Intermittent Pain Onset: Gradual Patients Stated Pain Goal: 4 Pain Intervention(s): Repositioned Multiple Pain Sites: No ADL: ADL ADL Comments: see functional navigator   Therapy/Group: Individual Therapy  Curtis Sites 12/21/2017, 12:13 PM

## 2017-12-21 NOTE — Progress Notes (Signed)
[facility] PHYSICAL MEDICINE & REHABILITATION     PROGRESS NOTE    Subjective/Complaints: No new issues this morning.  Happy that staples were out of the scalp.  Vigorously eating his breakfast.  Speech therapy at bedside  ROS: Patient denies fever, rash, sore throat, blurred vision, nausea, vomiting, diarrhea, cough, shortness of breath or chest pain, joint or back pain, headache, or mood change.   Objective: Vital Signs: Blood pressure 131/63, pulse 83, temperature 98 F (36.7 C), temperature source Axillary, resp. rate 19, weight 93 kg (205 lb 0.4 oz), SpO2 96 %. No results found. Recent Labs    12/18/17 1914  WBC 7.5  HGB 9.0*  HCT 28.3*  PLT 215   Recent Labs    12/18/17 1914  NA 132*  K 3.4*  CL 94*  GLUCOSE 142*  BUN 20  CREATININE 3.82*  CALCIUM 7.9*   CBG (last 3)  Recent Labs    12/20/17 1632 12/20/17 2128 12/21/17 0636  GLUCAP 177* 136* 142*    Wt Readings from Last 3 Encounters:  12/21/17 93 kg (205 lb 0.4 oz)  12/16/17 91.5 kg (201 lb 11.5 oz)  12/04/17 91.3 kg (201 lb 4.5 oz)    Physical Exam:  Constitutional: No distress . Vital signs reviewed. HEENT: EOMI, oral membranes moist Cardiovascular: RRR without murmur. No JVD    Respiratory: CTA Bilaterally without wheezes or rales. Normal effort    GI: BS +, non-tender, non-distended  Skin. Warm, healing wounds left arm/leg. Minimal edema LLE Neurological: He is alert and oriented.  Follows commands. Fair insight and awareness.  A little impulsive Motor: RUE: 5/5 proximal to distal LUE: 3/5 proximal to 4-/5 distal RLE: 3/5 HF, KE, 4-/5 ADF LLE: HF 2-/5, KE 2+/5, 3- ADF, limited somewhat by left knee (oa/valgus def) Psych: pleasant  Assessment/Plan: 1. Functional deficits and left HP secondary to right SDH which require 3+ hours per day of interdisciplinary therapy in a comprehensive inpatient rehab setting. Physiatrist is providing close team supervision and 24 hour management of active  medical problems listed below. Physiatrist and rehab team continue to assess barriers to discharge/monitor patient progress toward functional and medical goals.  Function:  Bathing Bathing position   Position: Shower  Bathing parts Body parts bathed by patient: Right arm, Left arm, Chest, Abdomen, Front perineal area, Right upper leg, Left upper leg Body parts bathed by helper: Buttocks, Right lower leg, Left lower leg, Back  Bathing assist Assist Level: 2 helpers      Upper Body Dressing/Undressing Upper body dressing   What is the patient wearing?: Button up shirt       Pull over shirt/dress - Perfomed by helper: Thread/unthread right sleeve, Thread/unthread left sleeve, Put head through opening, Pull shirt over trunk   Button up shirt - Perfomed by helper: Thread/unthread right sleeve, Thread/unthread left sleeve, Pull shirt around back, Button/unbutton shirt    Upper body assist Assist Level: (total A)      Lower Body Dressing/Undressing Lower body dressing   What is the patient wearing?: Socks, Underwear, Pants   Underwear - Performed by helper: Thread/unthread right underwear leg, Thread/unthread left underwear leg, Pull underwear up/down   Pants- Performed by helper: Thread/unthread right pants leg, Thread/unthread left pants leg, Pull pants up/down   Non-skid slipper socks- Performed by helper: Don/doff right sock, Don/doff left sock   Socks - Performed by helper: Don/doff right sock, Don/doff left sock  Lower body assist Assist for lower body dressing: 2 Helpers      Toileting Toileting Toileting activity did not occur: No continent bowel/bladder event   Toileting steps completed by helper: Adjust clothing prior to toileting, Performs perineal hygiene, Adjust clothing after toileting(per Candice Applewhite, NT)    Toileting assist     Transfers Chair/bed transfer   Chair/bed transfer method: Lateral scoot Chair/bed transfer assist level: 2  helpers(per Candice Applewhite, NT) Chair/bed transfer assistive device: Bedrails, Armrests     Locomotion Ambulation Ambulation activity did not occur: Safety/medical concerns         Wheelchair     Max wheelchair distance: 25 ft Assist Level: Touching or steadying assistance (Pt > 75%)  Cognition Comprehension Comprehension assist level: Understands basic 90% of the time/cues < 10% of the time  Expression Expression assist level: Expresses basic 90% of the time/requires cueing < 10% of the time.  Social Interaction Social Interaction assist level: Interacts appropriately 90% of the time - Needs monitoring or encouragement for participation or interaction.  Problem Solving Problem solving assist level: Solves basic 75 - 89% of the time/requires cueing 10 - 24% of the time  Memory Memory assist level: Recognizes or recalls 75 - 89% of the time/requires cueing 10 - 24% of the time   Medical Problem List and Plan:  1. Decreased functional mobility with left-sided weakness and dysphagia secondary to traumatic right subdural hematoma status post right frontotemporal parietal craniotomy 12/05/2017    Cont CIR   -Team conference today 2. DVT Prophylaxis/Anticoagulation: SCDs.    Dopplers suggesting right femoral and posterior tibial DVT and left peroneal DVT - status post IVC filter placement on 3/9---no issues at present 3. Pain Management: Oxycodone as needed  4. Mood: Seroquel 25 mg twice a day  5. Neuropsych: This patient is capable of making decisions on his own behalf.  6. Skin/Wound Care: Routine skin checks    -Scalp staples removed without issue 7. Fluids/Electrolytes/Nutrition: Routine I&O's    -On regular diet and eating well. 8. End-stage renal disease. Continue hemodialysis as per renal services. Status post arteriovenous Gore-Tex graft left arm 12/15/2017  -HD scheduled after therapies to maximize therapy time/performance  9. Diabetes mellitus and peripheral neuropathy.  Latest hemoglobin A1c 6.4. SSI. Check blood sugars before meals and at bedtime    Fair control at present with occasional elevations into 150's-170's   -He is on low-dose Glucotrol at home.  Will resume today 12/21/2017 10. Acute on chronic anemia. Continue Aranesp.    Hemoglobin 9.0 on 3/9 11. 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 Monitor for any signs of fluid overload   Filed Weights   12/19/17 0536 12/20/17 0240 12/21/17 0259  Weight: 91 kg (200 lb 9.9 oz) 93 kg (205 lb 0.4 oz) 93 kg (205 lb 0.4 oz)    Volume mgt ongoing per renal 12. Hypertension. Monitor with increased mobility    Blood pressure controlled at present 13. MRSA PCR screening positive. Contact precautions  14. Dysphagia:  Advanced to regular texture diet on 3/8   LOS (Days) 5 A FACE TO FACE EVALUATION WAS PERFORMED      [name], MD 12/21/2017 8:21 AM

## 2017-12-22 ENCOUNTER — Inpatient Hospital Stay (HOSPITAL_COMMUNITY): Payer: Medicare Other | Admitting: Physical Therapy

## 2017-12-22 ENCOUNTER — Inpatient Hospital Stay (HOSPITAL_COMMUNITY): Payer: Medicare Other | Admitting: Speech Pathology

## 2017-12-22 ENCOUNTER — Inpatient Hospital Stay (HOSPITAL_COMMUNITY): Payer: Medicare Other

## 2017-12-22 ENCOUNTER — Inpatient Hospital Stay (HOSPITAL_COMMUNITY): Payer: Medicare Other | Admitting: Occupational Therapy

## 2017-12-22 LAB — GLUCOSE, CAPILLARY
GLUCOSE-CAPILLARY: 113 mg/dL — AB (ref 65–99)
Glucose-Capillary: 150 mg/dL — ABNORMAL HIGH (ref 65–99)
Glucose-Capillary: 118 mg/dL — ABNORMAL HIGH (ref 65–99)
Glucose-Capillary: 101 mg/dL — ABNORMAL HIGH (ref 65–99)

## 2017-12-22 NOTE — Progress Notes (Signed)
Speech Language Pathology Daily Session Note  Patient Details  Name: Albert Stanley MRN: [id number] Date of Birth: [birth year]  Today's Date: 12/22/2017 SLP Individual Time: 0730-0830 SLP Individual Time Calculation (min): 60 min  Short Term Goals: Week 1: SLP Short Term Goal 1 (Week 1): Pt will consume regular textures and thin liquids with mod I use of swallowing precautions and minimal overt s/s of aspiration.  SLP Short Term Goal 2 (Week 1): Pt will selectively attend to tasks in a moderately distracting environment for 15 minutes with supervision cues for redirection to task.   SLP Short Term Goal 3 (Week 1): Pt will complete semi-complex tasks with min verbal cues for functional problem solving.   SLP Short Term Goal 4 (Week 1): Pt will recall semi-complex daily information with min verbal cues for use of external aids.   SLP Short Term Goal 5 (Week 1): Pt will recognize and correct errors in the moment during structured tasks with min verbal cues.    Skilled Therapeutic Interventions: Skilled treatment session focused on dysphagia and cognitive goals. SLP facilitated session by providing skilled observation of pt consuming breakfast. Pt consumed regular textures with thin liquids without overt s/s of aspiration and demonstrated use of swallowing compensatory strategies with Mod I. SLP further facilitated session by providing Supervision verbal cues for problem solving during a basic safety awareness picture description task. Pt demonstrated selective attention in a mildly distracting environment for 30 minutes with Mod I. Pt also required Min A verbal cues for problem solving in regards to reading/utilizing daily schedule to anticipate upcoming sessions. Pt left upright in bed with alarm on and all needs within reach. Continue with current plan of care.      Function:  Eating Eating   Modified Consistency Diet: No Eating Assist Level: No help, No cues;Swallowing techniques: self  managed           Cognition Comprehension Comprehension assist level: Understands basic 90% of the time/cues < 10% of the time  Expression   Expression assist level: Expresses basic needs/ideas: With extra time/assistive device  Social Interaction Social Interaction assist level: Interacts appropriately with others with medication or extra time (anti-anxiety, antidepressant).  Problem Solving Problem solving assist level: Solves basic 90% of the time/requires cueing < 10% of the time  Memory Memory assist level: Recognizes or recalls 75 - 89% of the time/requires cueing 10 - 24% of the time    Pain Pain Assessment Pain Assessment: No/denies pain  Therapy/Group: Individual Therapy  Meredeth Ide  SLP - Student 12/22/2017, 9:37 AM

## 2017-12-22 NOTE — Progress Notes (Signed)
[facility] MEDICINE & REHABILITATION     PROGRESS NOTE    Subjective/Complaints: Up with therapy. No new complaints. Denies pain. Asked about wounds on legs  ROS: Patient denies fever, rash, sore throat, blurred vision, nausea, vomiting, diarrhea, cough, shortness of breath or chest pain, joint or back pain, headache, or mood change.    Objective: Vital Signs: Blood pressure 117/65, pulse 81, temperature 98.4 F (36.9 C), temperature source Oral, resp. rate 18, weight 90 kg (198 lb 6.6 oz), SpO2 98 %. No results found. Recent Labs    12/21/17 1648  WBC 8.4  HGB 8.4*  HCT 26.4*  PLT 202   Recent Labs    12/21/17 1447  NA 131*  K 3.8  CL 94*  GLUCOSE 155*  BUN 44*  CREATININE 8.08*  CALCIUM 7.6*   CBG (last 3)  Recent Labs    12/21/17 1930 12/21/17 2347 12/22/17 0636  GLUCAP 112* 218* 150*    Wt Readings from Last 3 Encounters:  12/22/17 90 kg (198 lb 6.6 oz)  12/16/17 91.5 kg (201 lb 11.5 oz)  12/04/17 91.3 kg (201 lb 4.5 oz)    Physical Exam:  Constitutional: No distress . Vital signs reviewed. HEENT: EOMI, oral membranes moist Cardiovascular: RRR without murmur. No JVD    Respiratory: CTA Bilaterally without wheezes or rales. Normal effort    GI: BS +, non-tender, non-distended  Ext: edema 1+ bilateral pedal Neurological: He is alert and oriented.  Follows commands. Fair insight and awareness.  A little impulsive Motor: RUE: 5/5 proximal to distal LUE: 3/5 proximal to 4-/5 distal RLE: 3/5 HF, KE, 4-/5 ADF LLE: HF 2-/5, KE 2+/5, 3- ADF, limited somewhat by left knee (oa/valgus def without change) Psych: pleasant Skin-healing scabs/wounds on both feet/legs.  Assessment/Plan: 1. Functional deficits and left HP secondary to right SDH which require 3+ hours per day of interdisciplinary therapy in a comprehensive inpatient rehab setting. Physiatrist is providing close team supervision and 24 hour management of active medical problems listed  below. Physiatrist and rehab team continue to assess barriers to discharge/monitor patient progress toward functional and medical goals.  Function:  Bathing Bathing position   Position: Wheelchair/chair at sink  Bathing parts Body parts bathed by patient: Right arm, Left arm, Chest, Abdomen, Front perineal area, Right upper leg, Left upper leg Body parts bathed by helper: Buttocks, Right lower leg, Left lower leg, Back  Bathing assist Assist Level: 2 helpers      Upper Body Dressing/Undressing Upper body dressing   What is the patient wearing?: Button up shirt       Pull over shirt/dress - Perfomed by helper: Thread/unthread right sleeve, Thread/unthread left sleeve, Put head through opening, Pull shirt over trunk Button up shirt - Perfomed by patient: Thread/unthread right sleeve Button up shirt - Perfomed by helper: Thread/unthread left sleeve, Pull shirt around back, Button/unbutton shirt    Upper body assist Assist Level: Touching or steadying assistance(Pt > 75%)      Lower Body Dressing/Undressing Lower body dressing   What is the patient wearing?: Socks, Underwear, Pants   Underwear - Performed by helper: Thread/unthread right underwear leg, Thread/unthread left underwear leg, Pull underwear up/down   Pants- Performed by helper: Thread/unthread right pants leg, Thread/unthread left pants leg, Pull pants up/down   Non-skid slipper socks- Performed by helper: Don/doff right sock, Don/doff left sock   Socks - Performed by helper: Don/doff right sock, Don/doff left sock  Lower body assist Assist for lower body dressing: 2 Helpers      Toileting Toileting Toileting activity did not occur: No continent bowel/bladder event   Toileting steps completed by helper: Adjust clothing prior to toileting, Performs perineal hygiene, Adjust clothing after toileting Toileting Assistive Devices: Other (comment)(BSC over toilet, SARA lift)  Toileting assist Assist  level: Two helpers   Transfers Chair/bed transfer   Chair/bed transfer method: Squat pivot Chair/bed transfer assist level: Moderate assist (Pt 50 - 74%/lift or lower) Chair/bed transfer assistive device: Armrests     Locomotion Ambulation Ambulation activity did not occur: Safety/medical concerns         Wheelchair     Max wheelchair distance: 25 ft Assist Level: Touching or steadying assistance (Pt > 75%)  Cognition Comprehension Comprehension assist level: Understands basic 90% of the time/cues < 10% of the time  Expression Expression assist level: Expresses basic 90% of the time/requires cueing < 10% of the time.  Social Interaction Social Interaction assist level: Interacts appropriately with others with medication or extra time (anti-anxiety, antidepressant).  Problem Solving Problem solving assist level: Solves basic 50 - 74% of the time/requires cueing 25 - 49% of the time  Memory Memory assist level: Recognizes or recalls 75 - 89% of the time/requires cueing 10 - 24% of the time   Medical Problem List and Plan:  1. Decreased functional mobility with left-sided weakness and dysphagia secondary to traumatic right subdural hematoma status post right frontotemporal parietal craniotomy 12/05/2017    Cont CIR PT, OT, SLP 2. DVT Prophylaxis/Anticoagulation: SCDs.    Dopplers suggesting right femoral and posterior tibial DVT and left peroneal DVT - status post IVC filter placement on 3/9.  3. Pain Management: Oxycodone as needed  4. Mood: Seroquel 25 mg twice a day  5. Neuropsych: This patient is capable of making decisions on his own behalf.  6. Skin/Wound Care: Routine skin checks    -Scalp staples removed   7. Fluids/Electrolytes/Nutrition: Routine I&O's    -On regular diet and eating well. 8. End-stage renal disease. Continue hemodialysis as per renal services. Status post arteriovenous Gore-Tex graft left arm 12/15/2017  -HD scheduled after therapies to maximize therapy  time/performance  -chronic foley 9. Diabetes mellitus and peripheral neuropathy. Latest hemoglobin A1c 6.4. SSI. Check blood sugars before meals and at bedtime    Fair control at present with occasional elevations into 150's-170's   -resumed low-dose Glucotrol QD   12/21/2017 10. Acute on chronic anemia. Continue Aranesp.    Hemoglobin 9.0 on 3/9 11. 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 Monitor for any signs of fluid overload   Filed Weights   12/21/17 1400 12/21/17 1823 12/22/17 0231  Weight: 92 kg (202 lb 13.2 oz) 90.2 kg (198 lb 13.7 oz) 90 kg (198 lb 6.6 oz)    Volume mgt ongoing per renal 12. Hypertension. Monitor with increased mobility    Blood pressure controlled at present 13. MRSA PCR screening positive. Contact precautions  14. Dysphagia:  Advanced to regular texture diet on 3/8   LOS (Days) 6 A FACE TO FACE EVALUATION WAS PERFORMED      [name], MD 12/22/2017 8:24 AM

## 2017-12-22 NOTE — Progress Notes (Signed)
Occupational Therapy Session Note  Patient Details  Name: Albert Stanley MRN: [id number] Date of Birth: May 26, 1946  Today's Date: 12/22/2017 OT Individual Time: 1000-1057 OT Individual Time Calculation (min): 57 min    Short Term Goals: Week 1:  OT Short Term Goal 1 (Week 1): Pt will transfer to BSC/ toilet with mod A +1  OT Short Term Goal 2 (Week 1): Pt will perform sit to stands in prep for clothing mangement with mod A  OT Short Term Goal 3 (Week 1): Pt will self feed after setup with supervision  OT Short Term Goal 4 (Week 1): Pt maintain dynamic sitting balance with supervision during functional tasks  Skilled Therapeutic Interventions/Progress Updates:    Pt seen for morning ADL routine while seated EOB with a focus on functional reaching during LB tasks, functional activity tolerance, and activity pacing. Pt required no vc for Ox4. Pt demonstrated significant improvement this session with LB ADL tasks, performing distal LE bathing with (S). Vc provided for use of figure 4 technique to don socks B. Pt required intermittent manual cues to sequence UB dressing. Vc required for activity pacing, breathing technique, and use of rest breaks, as well as education throughout session re ECT and pacing LB ADL tasks. Pt performed sit to stand transfer from EOB with max A and max manual facilitation to maintain L knee at midline. Total A required for pulling up pants in standing. Vc provided for sequencing stand pivot transfer to w/c. Pt then practiced simulated walk in shower transfer from w/c level, lifting B legs over raised entrance to shower. Chair dips performed at w/c level to increase B UE strength needed to initiate ADL transfers from seated level. Pt left in w/c with chair alarm activated, quick release belt applied, and all needs met.   Therapy Documentation Precautions:  Precautions Precautions: Fall Precaution Comments: weak left knee (had problems with it  before) Restrictions Weight Bearing Restrictions: No Pain: Pain Assessment Pain Assessment: 0-10 Pain Score: 2  Pain Type: Acute pain Pain Location: Back Pain Descriptors / Indicators: Aching Pain Frequency: Occasional Pain Onset: On-going Pain Intervention(s): Medication (See eMAR) ADL: ADL ADL Comments: see functional navigator   Therapy/Group: Individual Therapy  Curtis Sites 12/22/2017, 11:11 AM

## 2017-12-22 NOTE — Progress Notes (Signed)
Admit: 12/16/2017 LOS: 6  27M ESRD [facility] THS s/p Fall with SDH; DVT in LE s/p IVC Filter 3/9  Subjective:  HD yesterday, pt w/o complaints; post wt 90.2kg, 2.2L UF  03/12 0701 - 03/13 0700 In: 720 [P.O.:720] Out: 2246 [Stool:1]  Filed Weights   12/21/17 1400 12/21/17 1823 12/22/17 0231  Weight: 92 kg (202 lb 13.2 oz) 90.2 kg (198 lb 13.7 oz) 90 kg (198 lb 6.6 oz)    Scheduled Meds: . allopurinol  300 mg Oral Daily  . amiodarone  200 mg Oral Daily  . calcitRIOL  0.5 mcg Oral Q T,Th,Sa-HD  . darbepoetin (ARANESP) injection - DIALYSIS  150 mcg Intravenous Q Sat-HD  . feeding supplement (PRO-STAT SUGAR FREE 64)  30 mL Oral BID  . glipiZIDE  2.5 mg Oral QAC breakfast  . insulin aspart  0-9 Units Subcutaneous TID WC  . multivitamin  1 tablet Oral QHS  . mupirocin ointment   Nasal BID  . oxybutynin  5 mg Oral BID  . pantoprazole  40 mg Oral QHS  . polyethylene glycol  17 g Oral Daily  . senna-docusate  2 tablet Oral QHS  . sevelamer carbonate  1,600 mg Oral TID WC   Continuous Infusions: PRN Meds:.acetaminophen, albuterol, bisacodyl, bisacodyl, diphenhydrAMINE, heparin, MUSCLE RUB, ondansetron **OR** ondansetron (ZOFRAN) IV, oxyCODONE-acetaminophen, sorbitol  Current Labs: reviewed   OP Dialysis:TTS [facility] 4h 88.5kg 2/2.25 bath P4 LUA AVF Hep 7000 Venofer 50mg  IV qHD Mircera 176mcg IV q2wks - last 2/14 Calcitriol 3.22mcg PO qHD  Sensipar 30mg  qd Renvela 800mg - 2AC TID, 2 w/snacks   Physical Exam:  Blood pressure 117/65, pulse 81, temperature 98.4 F (36.9 C), temperature source Oral, resp. rate 18, weight 90 kg (198 lb 6.6 oz), SpO2 98 %. LUE with AVG, swollen, pt states improving AAO x3 Trace LEE TDC RRR w/o mumur CTAB  A 1. ESRD THS [facility] using AVF 2. S/p Fall and SDH with craniotomy 2/24; in CIR 3. Clotted Acccess, s/p new LUE AG and L IJ TDC (3/6) 4. HTN 5. Anemia on Aranesp 150 qSat 6. CKD BMD on C3, renvela 2qAC 7. DM2 8. Chronic indwelling foley hx/o  bladder cancer  P 1. HD THS schedule using TDC: 3K, 4h, target 3L UF BP permitting, no heparin   Pearson Grippe MD 12/22/2017, 9:48 AM  Recent Labs  Lab 12/16/17 1213 12/18/17 1914 12/21/17 1447  NA 131* 132* 131*  K 4.0 3.4* 3.8  CL 92* 94* 94*  CO2 25 25 23   GLUCOSE 127* 142* 155*  BUN 48* 20 44*  CREATININE 5.61* 3.82* 8.08*  CALCIUM 7.4* 7.9* 7.6*  PHOS 5.4* 4.0 5.4*   Recent Labs  Lab 12/16/17 1213 12/18/17 1914 12/21/17 1648  WBC 7.0 7.5 8.4  HGB 7.8* 9.0* 8.4*  HCT 23.9* 28.3* 26.4*  MCV 93.0 94.0 93.3  PLT 199 215 202

## 2017-12-22 NOTE — Plan of Care (Signed)
  RH BLADDER ELIMINATION RH STG MANAGE BLADDER WITH EQUIPMENT WITH ASSISTANCE Description STG Manage Bladder With Equipment With max Assistance- foley bag management/care chronic foly cath; per pt MD placed it and waiting return check up; HD pt.   12/22/2017 1003 - Not Progressing by Ander Slade, RN

## 2017-12-22 NOTE — Progress Notes (Signed)
Nutrition Follow-up  DOCUMENTATION CODES:   Not applicable  INTERVENTION:  Continue 30 ml Prostat po BID, each supplement provides 100 kcal and 15 grams of protein.   Encourage adequate PO intake.  NUTRITION DIAGNOSIS:   Increased nutrient needs related to chronic illness(ESRD on HD) as evidenced by estimated needs; ongoing  GOAL:   Patient will meet greater than or equal to 90% of their needs; met  MONITOR:   PO intake, Supplement acceptance, Weight trends, Labs, Skin, I & O's  REASON FOR ASSESSMENT:   Consult Diet education  ASSESSMENT:   71 y.o. right handed male with history of bladder cancer with resection in 1999 lymphedema, end-stage renal disease with hemodialysis, diastolic congestive heart failure, diabetes mellitus, hypertension, nonischemic cardiomyopathy.  Presented 12/05/2017 with fatigue, vomiting and diarrhea 2 days as well as headache. Cranial CT reviewed showing right SDH. Neurosurgery consulted underwent right frontotemporal parietal craniotomy hematoma evacuation 12/05/2017.   Meal completion has been 100% recently. Intake has improved. Pt currently has Prostat ordered with varied intake. RD to continue with current orders to aid in increased protein needs. Pt encouraged to eat his food at meals and to take his supplements. Labs and medications reviewed.   Diet Order:  Diet renal/carb modified with fluid restriction Diet-HS Snack? Nothing; Fluid restriction: 1200 mL Fluid; Room service appropriate? Yes; Fluid consistency: Thin  EDUCATION NEEDS:   Education needs have been addressed  Skin:  Skin Assessment: Skin Integrity Issues: Skin Integrity Issues:: Incisions Incisions: head, L arm  Last BM:  3/12  Height:   Ht Readings from Last 1 Encounters:  12/05/17 5' 9" (1.753 m)    Weight:   Wt Readings from Last 1 Encounters:  12/22/17 198 lb 6.6 oz (90 kg)    Ideal Body Weight:  72.7 kg  BMI:  Body mass index is 29.3 kg/m.  Estimated  Nutritional Needs:   Kcal:  2100-2300  Protein:  110-120 grams  Fluid:  1.2 L/day     , MS, RD, LDN Pager # [phone] After hours/ weekend pager # [phone]  

## 2017-12-22 NOTE — Progress Notes (Signed)
Physical Therapy Note  Patient Details  Name: LEIGHTON BRICKLEY MRN: [id number] Date of Birth: [birth year] Today's Date: 12/22/2017  1300-1330, 30 min individual tx Pain: none per pt, but stated L hip was uncomfortable due to positioning; pillows used for positioning at end of session  Tx focused on increasing pt's comfort in w/c, and propelling w/c.  Pt's L knee uncomfortable due to L hip adduction in sitting. Bill ELRs added to w/c and pillow placed behind back and 2 pillows between knees at rest.  Pt propelled w/c on level tile x 25' using bil UEs, limited by fatigue.    Pt left resting in w/c with alarm set, quick release belt donned and needs at hand.  See function navigator for current status.  Inza Mikrut 12/22/2017, 12:30 PM

## 2017-12-22 NOTE — Progress Notes (Signed)
Physical Therapy Session Note  Patient Details  Name: Albert Stanley MRN: [id number] Date of Birth: Apr 23, 1946  Today's Date: 12/22/2017 PT Individual Time: 1405-1500 PT Individual Time Calculation (min): 55 min   Short Term Goals: Week 1:  PT Short Term Goal 1 (Week 1): Pt will initiate gait training.  PT Short Term Goal 2 (Week 1): Pt will complete bed<>w/c with mod assist +1. PT Short Term Goal 3 (Week 1): Pt will propel w/c 75 ft with BUE & supervision.  Skilled Therapeutic Interventions/Progress Updates: Pt received seated in w/c, c/o pain as below and agreeable to treatment. W/c propulsion x75' with BUE and S; slow speed and requires several stops to reposition in chair d/t significant posterior pelvic tilt. Sit <>stand at [employer] rail x3 trials with maxA +2 on first trial, modA for each trial after. In standing, significant L genu valgus with poor WB through L side. Therapist performed sit >stand in front of pt with stabilization at L knee to maintain neutral alignment. Pt hesitant to take RLE steps forward, leaning heavily on R elbow on rail despite multimodal cueing. Sit <>stand performed in parallel bars with same stabilization provided at L knee; with BUE support pt able to take 4-5 RLE forward/backward steps for pre-gait in an effort to facilitate LLE weight bearing and stance control. Provided tactile cueing to L glutes in stance with mild activation, significant atrophy noted. Continues to be limited by LLE pain in standing. Returned to bed stand pivot to R side using bedrail in RUE with min/modA. Sit >supine maxA for LE management. Bridging to A with scooting toward HOB. Performed BLE bridging x10 reps. Remained supine in bed, all needs in reach and friends present.      Therapy Documentation Precautions:  Precautions Precautions: Fall Precaution Comments: weak left knee (had problems with it before) Restrictions Weight Bearing Restrictions: No Pain: Pain Assessment Pain  Assessment: 0-10 Pain Score: 8  Pain Type: Acute pain Pain Location: Back Pain Orientation: Left Pain Radiating Towards: knees Pain Descriptors / Indicators: Aching Pain Frequency: Occasional Pain Onset: On-going Pain Intervention(s): Medication (See eMAR)   See Function Navigator for Current Functional Status.   Therapy/Group: Individual Therapy  Luberta Mutter 12/22/2017, 4:14 PM

## 2017-12-23 ENCOUNTER — Inpatient Hospital Stay (HOSPITAL_COMMUNITY): Payer: Medicare Other | Admitting: Physical Therapy

## 2017-12-23 ENCOUNTER — Inpatient Hospital Stay (HOSPITAL_COMMUNITY): Payer: Medicare Other | Admitting: Speech Pathology

## 2017-12-23 ENCOUNTER — Inpatient Hospital Stay (HOSPITAL_COMMUNITY): Payer: Medicare Other | Admitting: Occupational Therapy

## 2017-12-23 LAB — GLUCOSE, CAPILLARY
GLUCOSE-CAPILLARY: 93 mg/dL (ref 65–99)
GLUCOSE-CAPILLARY: 87 mg/dL (ref 65–99)
GLUCOSE-CAPILLARY: 60 mg/dL — AB (ref 65–99)
GLUCOSE-CAPILLARY: 107 mg/dL — AB (ref 65–99)
GLUCOSE-CAPILLARY: 61 mg/dL — AB (ref 65–99)
Glucose-Capillary: 167 mg/dL — ABNORMAL HIGH (ref 65–99)

## 2017-12-23 LAB — CBC
HCT: 26 % — ABNORMAL LOW (ref 39.0–52.0)
HEMOGLOBIN: 8.4 g/dL — AB (ref 13.0–17.0)
MCH: 30.8 pg (ref 26.0–34.0)
MCHC: 32.3 g/dL (ref 30.0–36.0)
MCV: 95.2 fL (ref 78.0–100.0)
Platelets: 171 10*3/uL (ref 150–400)
RBC: 2.73 MIL/uL — AB (ref 4.22–5.81)
RDW: 19 % — ABNORMAL HIGH (ref 11.5–15.5)
WBC: 6.2 10*3/uL (ref 4.0–10.5)

## 2017-12-23 LAB — RENAL FUNCTION PANEL
ALBUMIN: 2.7 g/dL — AB (ref 3.5–5.0)
ANION GAP: 13 (ref 5–15)
BUN: 39 mg/dL — ABNORMAL HIGH (ref 6–20)
CALCIUM: 8.2 mg/dL — AB (ref 8.9–10.3)
CO2: 24 mmol/L (ref 22–32)
Chloride: 94 mmol/L — ABNORMAL LOW (ref 101–111)
Creatinine, Ser: 7.15 mg/dL — ABNORMAL HIGH (ref 0.61–1.24)
GFR, EST AFRICAN AMERICAN: 8 mL/min — AB (ref >60)
GFR, EST NON AFRICAN AMERICAN: 7 mL/min — AB (ref >60)
Glucose, Bld: 97 mg/dL (ref 65–99)
PHOSPHORUS: 4.4 mg/dL (ref 2.5–4.6)
Potassium: 4.1 mmol/L (ref 3.5–5.1)
SODIUM: 131 mmol/L — AB (ref 135–145)

## 2017-12-23 MED ORDER — SODIUM CHLORIDE 0.9% FLUSH
10.0000 mL | Freq: Two times a day (BID) | INTRAVENOUS | Status: DC
  Administered 2017-12-25: 10 mL
  Administered 2017-12-26: 20 mL
  Administered 2018-01-02: 10 mL

## 2017-12-23 MED ORDER — SODIUM CHLORIDE 0.9% FLUSH: 10.0000 mL | INTRAVENOUS | Status: DC | PRN

## 2017-12-23 NOTE — Progress Notes (Signed)
Physical Therapy Session Note  Patient Details  Name: Albert Stanley MRN: [id number] Date of Birth: [birth year]  Today's Date: 12/23/2017 PT Individual Time: 7048-8891 PT Individual Time Calculation (min): 88 min   Short Term Goals: Week 1:  PT Short Term Goal 1 (Week 1): Pt will initiate gait training.  PT Short Term Goal 2 (Week 1): Pt will complete bed<>w/c with mod assist +1. PT Short Term Goal 3 (Week 1): Pt will propel w/c 75 ft with BUE & supervision.  Skilled Therapeutic Interventions/Progress Updates:  Pt received in w/c & agreeable to tx. Pt with c/o pain in L knee at rest that increases with movement & weight bearing. Pt performed BLE strengthening exercises (no weight LLE, 2.5# ankle weight RLE) consisting of long arc quads and hip flexion; pt with very minimal activation LLE with pt reporting pain & weakness in extremity. In dayroom pt transferred onto tilt table with maximove total assist. Pt transitioned to 70-90 degrees upright and engaged in marching and mini squats with and without UE support with task focusing on weight shifting L<>R, weight bearing LLE, and strengthening. Pt minimally able to weight shift L and instead relies heavily on hands for support. Pt with difficulty completely extending knees when performing squats. Pt also requested tilt table return to supine position frequently to rest & alleviate pain in L knee. Pt returned to w/c in same manner as noted above & pt's daughter Threasa Beards) arrived. Threasa Beards concerned about pt's L knee but reports he has been using a RW prior to admission. Educated Threasa Beards that pt will more than likely d/c at a w/c level as pt is unable to bear weight through LLE. Provided her with home measurement sheet as well as handouts on how to build a ramp and bumping pt up steps in w/c. Threasa Beards reports pt has a chair lift to access split level house & 1 step to enter back door. Also discussed goals of PT and pt's progress. At end of session pt left  sitting in w/c in dayroom awaiting SLP arrival with [facility] present.   Therapy Documentation Precautions:  Precautions Precautions: Fall Precaution Comments: weak left knee (had problems with it before) Restrictions Weight Bearing Restrictions: No   See Function Navigator for Current Functional Status.   Therapy/Group: Individual Therapy  [name] 12/23/2017, 1:40 PM

## 2017-12-23 NOTE — Progress Notes (Signed)
Speech Language Pathology Daily Session Note  Patient Details  Name: Albert Stanley MRN: [id number] Date of Birth: [birth year]  Today's Date: 12/23/2017 SLP Individual Time: 1115-1200 SLP Individual Time Calculation (min): 45 min  Short Term Goals: Week 1: SLP Short Term Goal 1 (Week 1): Pt will consume regular textures and thin liquids with mod I use of swallowing precautions and minimal overt s/s of aspiration.  SLP Short Term Goal 2 (Week 1): Pt will selectively attend to tasks in a moderately distracting environment for 15 minutes with supervision cues for redirection to task.   SLP Short Term Goal 3 (Week 1): Pt will complete semi-complex tasks with min verbal cues for functional problem solving.   SLP Short Term Goal 4 (Week 1): Pt will recall semi-complex daily information with min verbal cues for use of external aids.   SLP Short Term Goal 5 (Week 1): Pt will recognize and correct errors in the moment during structured tasks with min verbal cues.   Week 2:    Skilled Therapeutic Interventions: Skilled treatment session focused on cognitive goals. SLP facilitated session by providing Mod A verbal cues for problem solving during a complex medication management task. Pt required Supervision verbal cues for recall of current medication and events from the previous therapy session. Given Mod A verbal cues, pt able to self-monitor and correct errors throughout the task. Pt demonstrated selective attention in a mildly distracting environment for ~25 minutes with Mod I. Pt handed off to NT for transfer back to room. Continue with current plan of care.      Function:  Cognition Comprehension Comprehension assist level: Follows basic conversation/direction with extra time/assistive device  Expression   Expression assist level: Expresses basic 90% of the time/requires cueing < 10% of the time.  Social Interaction Social Interaction assist level: Interacts appropriately with others with  medication or extra time (anti-anxiety, antidepressant).  Problem Solving Problem solving assist level: Solves basic 75 - 89% of the time/requires cueing 10 - 24% of the time  Memory Memory assist level: Recognizes or recalls 90% of the time/requires cueing < 10% of the time    Pain Pain Assessment Pain Assessment: No/denies pain   Therapy/Group: Individual Therapy  Meredeth Ide  SLP - Student 12/23/2017, 2:36 PM

## 2017-12-23 NOTE — Plan of Care (Signed)
  Progressing Consults [facility] BRAIN INJURY PATIENT EDUCATION Description Description: See Patient Education module for eduction specifics 12/23/2017 1414 - Progressing by Claude Manges, LPN Skin Care Protocol Initiated - if Braden Score 18 or less Description If consults are not indicated, leave blank or document N/A 12/23/2017 1414 - Progressing by Claude Manges, LPN Nutrition Consult-if indicated 12/23/2017 1414 - Progressing by Claude Manges, LPN Diabetes Guidelines if Diabetic/Glucose > 140 Description If diabetic or lab glucose is > 140 mg/dl - Initiate Diabetes/Hyperglycemia Guidelines & Document Interventions  12/23/2017 1414 - Progressing by Claude Manges, LPN RH BOWEL ELIMINATION RH STG MANAGE BOWEL WITH ASSISTANCE Description STG Manage Bowel with  Mod Assistance.  12/23/2017 1414 - Progressing by Claude Manges, LPN RH STG MANAGE BOWEL W/MEDICATION W/ASSISTANCE Description STG Manage Bowel with Medication with mod Assistance.  12/23/2017 1414 - Progressing by Claude Manges, LPN RH BLADDER ELIMINATION RH STG MANAGE BLADDER WITH EQUIPMENT WITH ASSISTANCE Description STG Manage Bladder With Equipment With max Assistance- foley bag management/care   12/23/2017 1414 - Progressing by Claude Manges, LPN RH SKIN INTEGRITY RH STG SKIN FREE OF INFECTION/BREAKDOWN Description Skin free of infection/breakdown with min assistance  12/23/2017 1414 - Progressing by Claude Manges, LPN RH STG MAINTAIN SKIN INTEGRITY WITH ASSISTANCE Description STG Maintain Skin Integrity With mod Assistance.  12/23/2017 1414 - Progressing by Claude Manges, LPN RH STG ABLE TO PERFORM INCISION/WOUND CARE W/ASSISTANCE Description STG Able To Perform Incision/Wound Care With min Assistance.  12/23/2017 1414 - Progressing by Claude Manges, LPN RH SAFETY RH STG ADHERE TO SAFETY PRECAUTIONS W/ASSISTANCE/DEVICE Description STG Adhere to Safety Precautions With min  Assistance/Device.  12/23/2017 1414 - Progressing by Claude Manges, LPN RH STG DECREASED RISK OF FALL WITH ASSISTANCE Description STG Decreased Risk of Fall With min Assistance.  12/23/2017 1414 - Progressing by Claude Manges, LPN RH COGNITION-NURSING RH STG USES MEMORY AIDS/STRATEGIES W/ASSIST TO PROBLEM SOLVE Description STG Uses Memory Aids/Strategies With mod Assistance to Problem Solve.  12/23/2017 1414 - Progressing by Claude Manges, LPN RH STG ANTICIPATES NEEDS/CALLS FOR ASSIST W/ASSIST/CUES Description STG Anticipates Needs/Calls for Assist With mod Assistance/Cues.  12/23/2017 1414 - Progressing by Claude Manges, LPN RH PAIN MANAGEMENT RH STG PAIN MANAGED AT OR BELOW PT'S PAIN GOAL Description Pain <4 with min assistance  12/23/2017 1414 - Progressing by Claude Manges, LPN RH KNOWLEDGE DEFICIT BRAIN INJURY RH STG INCREASE KNOWLEDGE OF SELF CARE AFTER BRAIN INJURY Description Patient able to verbalize self care activities after brain injury with mod assistance  12/23/2017 1414 - Progressing by Claude Manges, LPN

## 2017-12-23 NOTE — Progress Notes (Signed)
Admit: 12/16/2017 LOS: 7  19M ESRD [facility] THS s/p Fall with SDH; DVT in LE s/p IVC Filter 3/9  Subjective:  No new events, working in the gym For HD today  03/13 0701 - 03/14 0700 In: 480 [P.O.:480] Out: -   Filed Weights   12/21/17 1823 12/22/17 0231 12/23/17 0500  Weight: 90.2 kg (198 lb 13.7 oz) 90 kg (198 lb 6.6 oz) 92 kg (202 lb 13.2 oz)    Scheduled Meds: . allopurinol  300 mg Oral Daily  . amiodarone  200 mg Oral Daily  . calcitRIOL  0.5 mcg Oral Q T,Th,Sa-HD  . darbepoetin (ARANESP) injection - DIALYSIS  150 mcg Intravenous Q Sat-HD  . feeding supplement (PRO-STAT SUGAR FREE 64)  30 mL Oral BID  . glipiZIDE  2.5 mg Oral QAC breakfast  . insulin aspart  0-9 Units Subcutaneous TID WC  . multivitamin  1 tablet Oral QHS  . mupirocin ointment   Nasal BID  . oxybutynin  5 mg Oral BID  . pantoprazole  40 mg Oral QHS  . polyethylene glycol  17 g Oral Daily  . senna-docusate  2 tablet Oral QHS  . sevelamer carbonate  1,600 mg Oral TID WC   Continuous Infusions: PRN Meds:.acetaminophen, albuterol, bisacodyl, bisacodyl, diphenhydrAMINE, heparin, MUSCLE RUB, ondansetron **OR** ondansetron (ZOFRAN) IV, oxyCODONE-acetaminophen, sorbitol  Current Labs: reviewed   OP Dialysis:TTS [facility] 4h 88.5kg 2/2.25 bath P4 LUA AVF Hep 7000 Venofer 50mg  IV qHD Mircera 165mcg IV q2wks - last 2/14 Calcitriol 3.27mcg PO qHD  Sensipar 30mg  qd Renvela 800mg - 2AC TID, 2 w/snacks   Physical Exam:  Blood pressure 128/67, pulse 83, temperature 98.5 F (36.9 C), temperature source Oral, resp. rate 18, weight 92 kg (202 lb 13.2 oz), SpO2 98 %. LUE with AVG, swollen, pt states improving AAO x3 Trace LEE TDC RRR w/o mumur CTAB  A 1. ESRD THS [facility] using AVF 2. S/p Fall and SDH with craniotomy 2/24; in CIR 3. Clotted Acccess, s/p new LUE AG and L IJ TDC (3/6) 4. HTN 5. Anemia on Aranesp 150 qSat 6. CKD BMD on C3, renvela 2qAC 7. DM2 8. Chronic indwelling foley hx/o bladder  cancer  P 1. HD THS schedule using TDC: 3K, 4h, target 3L UF BP permitting, no heparin   Pearson Grippe MD 12/23/2017, 10:18 AM  Recent Labs  Lab 12/16/17 1213 12/18/17 1914 12/21/17 1447  NA 131* 132* 131*  K 4.0 3.4* 3.8  CL 92* 94* 94*  CO2 25 25 23   GLUCOSE 127* 142* 155*  BUN 48* 20 44*  CREATININE 5.61* 3.82* 8.08*  CALCIUM 7.4* 7.9* 7.6*  PHOS 5.4* 4.0 5.4*   Recent Labs  Lab 12/16/17 1213 12/18/17 1914 12/21/17 1648  WBC 7.0 7.5 8.4  HGB 7.8* 9.0* 8.4*  HCT 23.9* 28.3* 26.4*  MCV 93.0 94.0 93.3  PLT 199 215 202

## 2017-12-23 NOTE — Progress Notes (Signed)
[place] PHYSICAL MEDICINE & REHABILITATION     PROGRESS NOTE    Subjective/Complaints: No new issues. Slept well. Asked about swelling in legs. Denies pain.  ROS: Patient denies fever, rash, sore throat, blurred vision, nausea, vomiting, diarrhea, cough, shortness of breath or chest pain, joint or back pain, headache, or mood change.   Objective: Vital Signs: Blood pressure 128/67, pulse 83, temperature 98.5 F (36.9 C), temperature source Oral, resp. rate 18, weight 92 kg (202 lb 13.2 oz), SpO2 98 %. No results found. Recent Labs    12/21/17 1648  WBC 8.4  HGB 8.4*  HCT 26.4*  PLT 202   Recent Labs    12/21/17 1447  NA 131*  K 3.8  CL 94*  GLUCOSE 155*  BUN 44*  CREATININE 8.08*  CALCIUM 7.6*   CBG (last 3)  Recent Labs    12/22/17 1709 12/22/17 2052 12/23/17 0640  GLUCAP 101* 113* 93    Wt Readings from Last 3 Encounters:  12/23/17 92 kg (202 lb 13.2 oz)  12/16/17 91.5 kg (201 lb 11.5 oz)  12/04/17 91.3 kg (201 lb 4.5 oz)    Physical Exam:  Constitutional: No distress . Vital signs reviewed. HEENT: EOMI, oral membranes moist Cardiovascular: RRR without murmur. No JVD    Respiratory: CTA Bilaterally without wheezes or rales. Normal effort    GI: BS +, non-tender, non-distended  Ext: tr to 1+ edema RLE, 1++ LLE.  Neurological: He is alert and oriented.  Follows commands. Fair insight and awareness.  A little impulsive Motor: RUE: 5/5 proximal to distal LUE: 3/5 proximal to 4-/5 distal RLE: 3/5 HF, KE, 4-/5 ADF LLE: HF 2-/5, KE 2+/5, 3- ADF---stable Psych: pleasant Skin-wounds all healing without drainage  Assessment/Plan: 1. Functional deficits and left HP secondary to right SDH which require 3+ hours per day of interdisciplinary therapy in a comprehensive inpatient rehab setting. Physiatrist is providing close team supervision and 24 hour management of active medical problems listed below. Physiatrist and rehab team continue to assess barriers  to discharge/monitor patient progress toward functional and medical goals.  Function:  Bathing Bathing position   Position: Sitting EOB  Bathing parts Body parts bathed by patient: Right arm, Left arm, Chest, Abdomen, Front perineal area, Right upper leg, Left upper leg, Right lower leg, Left lower leg Body parts bathed by helper: Buttocks, Right lower leg, Left lower leg, Back  Bathing assist Assist Level: Touching or steadying assistance(Pt > 75%)      Upper Body Dressing/Undressing Upper body dressing   What is the patient wearing?: Pull over shirt/dress, Button up shirt     Pull over shirt/dress - Perfomed by patient: Thread/unthread right sleeve, Put head through opening, Pull shirt over trunk, Thread/unthread left sleeve Pull over shirt/dress - Perfomed by helper: Thread/unthread right sleeve, Thread/unthread left sleeve, Put head through opening, Pull shirt over trunk Button up shirt - Perfomed by patient: Thread/unthread right sleeve, Pull shirt around back Button up shirt - Perfomed by helper: Thread/unthread left sleeve, Button/unbutton shirt    Upper body assist Assist Level: Touching or steadying assistance(Pt > 75%)      Lower Body Dressing/Undressing Lower body dressing   What is the patient wearing?: Pants, Socks   Underwear - Performed by helper: Thread/unthread right underwear leg, Thread/unthread left underwear leg, Pull underwear up/down Pants- Performed by patient: Thread/unthread left pants leg Pants- Performed by helper: Thread/unthread right pants leg, Pull pants up/down Non-skid slipper socks- Performed by patient: Don/doff right sock, Don/doff left  sock Non-skid slipper socks- Performed by helper: Don/doff right sock, Don/doff left sock   Socks - Performed by helper: Don/doff right sock, Don/doff left sock              Lower body assist Assist for lower body dressing: 2 Helpers      Toileting Toileting Toileting activity did not occur: No  continent bowel/bladder event   Toileting steps completed by helper: Adjust clothing prior to toileting, Performs perineal hygiene, Adjust clothing after toileting Toileting Assistive Devices: Other (comment)(BSC over toilet, SARA lift)  Toileting assist Assist level: Two helpers   Transfers Chair/bed transfer   Chair/bed transfer method: Squat pivot Chair/bed transfer assist level: 2 helpers Chair/bed transfer assistive device: Armrests     Locomotion Ambulation Ambulation activity did not occur: Safety/medical concerns         Wheelchair     Max wheelchair distance: 25 ft Assist Level: Touching or steadying assistance (Pt > 75%)  Cognition Comprehension Comprehension assist level: Understands basic 90% of the time/cues < 10% of the time  Expression Expression assist level: Expresses basic needs/ideas: With extra time/assistive device  Social Interaction Social Interaction assist level: Interacts appropriately with others with medication or extra time (anti-anxiety, antidepressant).  Problem Solving Problem solving assist level: Solves basic 75 - 89% of the time/requires cueing 10 - 24% of the time  Memory Memory assist level: Recognizes or recalls 75 - 89% of the time/requires cueing 10 - 24% of the time   Medical Problem List and Plan:  1. Decreased functional mobility with left-sided weakness and dysphagia secondary to traumatic right subdural hematoma status post right frontotemporal parietal craniotomy 12/05/2017    Cont CIR PT, OT, SLP 2. DVT Prophylaxis/Anticoagulation: SCDs.    Dopplers suggesting right femoral and posterior tibial DVT and left peroneal DVT - status post IVC filter placement on 3/9.    -reviewed the fact that he will have to deal with swelling in his legs given that he's not on anticoagulation. Discussed elevation/wrapping, etc. Left sided weakness only exacerbating 3. Pain Management: Oxycodone as needed  4. Mood: Seroquel 25 mg twice a day  5.  Neuropsych: This patient is capable of making decisions on his own behalf.  6. Skin/Wound Care: Routine skin checks    -Scalp staples removed   7. Fluids/Electrolytes/Nutrition: Routine I&O's    -On regular diet and eating well. 8. End-stage renal disease. Continue hemodialysis as per renal services. Status post arteriovenous Gore-Tex graft left arm 12/15/2017  -HD scheduled after therapies to maximize therapy time/performance  -chronic foley -volume mgt per nephrology 9. Diabetes mellitus and peripheral neuropathy. Latest hemoglobin A1c 6.4. SSI. Check blood sugars before meals and at bedtime    -resumed low-dose Glucotrol QD   12/21/2017 with improved control 10. Acute on chronic anemia. Continue Aranesp.    Hemoglobin 9.0 on 3/9 11. 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 Monitor for any signs of fluid overload   Filed Weights   12/21/17 1823 12/22/17 0231 12/23/17 0500  Weight: 90.2 kg (198 lb 13.7 oz) 90 kg (198 lb 6.6 oz) 92 kg (202 lb 13.2 oz)    Volume mgt ongoing per renal 12. Hypertension. Monitor with increased mobility    Blood pressure controlled 3/14 13. MRSA PCR screening positive. Contact precautions  14. Dysphagia:  Advanced to regular texture diet on 3/8   LOS (Days) 7 A FACE TO FACE EVALUATION WAS PERFORMED      [name], MD 12/23/2017 8:02 AM

## 2017-12-23 NOTE — Significant Event (Signed)
Pt sugar 60. 2 cups cranberry juice given and pt ate 100% dinner. Pt not displaying any s/s hypoglycemia. Will repeat BS in 30 minutes. Claude Manges, LPN

## 2017-12-23 NOTE — Progress Notes (Signed)
Occupational Therapy Session Note  Patient Details  Name: Albert Stanley MRN: [id number] Date of Birth: April 28, 1946  Today's Date: 12/23/2017 OT Individual Time: 5038-8828 OT Individual Time Calculation (min): 56 min    Short Term Goals: Week 1:  OT Short Term Goal 1 (Week 1): Pt will transfer to BSC/ toilet with mod A +1  OT Short Term Goal 2 (Week 1): Pt will perform sit to stands in prep for clothing mangement with mod A  OT Short Term Goal 3 (Week 1): Pt will self feed after setup with supervision  OT Short Term Goal 4 (Week 1): Pt maintain dynamic sitting balance with supervision during functional tasks  Skilled Therapeutic Interventions/Progress Updates:    Pt received in room sitting up in w/c. Pt completed morning ADL routine with a focus on standing tolerance during LB dressing techniques, functional reaching, and ADL transfers. Pt completed UB dressing with no vc, requiring additional time for problem solving through threading R and L UE in shirt. Vc required for use of figure 4 technique to don socks. Pt used stedy to transfer from w/c to standing with max manual cues posteriorly to facilitate upright posture. Pt able to remove B UE from stedy to pull up pants, requiring several rest breaks and intermittent manual cues for thoroughness. Pt demonstrated good progress with standing balance and tolerance this session. Pt left in room in w/c with chair alarm activated and safety belt on, with all needs met.   Therapy Documentation Precautions:  Precautions Precautions: Fall Precaution Comments: weak left knee (had problems with it before) Restrictions Weight Bearing Restrictions: No Pain: Pain Assessment Pain Assessment: 0-10 Pain Score: 6  Pain Type: Acute pain Pain Location: Back Pain Descriptors / Indicators: Aching Pain Onset: On-going Patients Stated Pain Goal: 4 Pain Intervention(s): Medication (See eMAR) Multiple Pain Sites: No ADL: ADL ADL Comments: see  functional navigator   Therapy/Group: Individual Therapy  Curtis Sites 12/23/2017, 11:11 AM

## 2017-12-23 NOTE — Progress Notes (Signed)
[facility]  Subjective  - Doing OK, states his left arm is feeling better.   Objective 128/67 83 98.5 F (36.9 C) (Oral) 18 98%  Intake/Output Summary (Last 24 hours) at 12/23/2017 0918 Last data filed at 12/22/2017 1700 Gross per 24 hour  Intake 480 ml  Output -  Net 480 ml   Left arm edema mod, improved from previous exam, arm is soft to palpation Left arm is non tender to palpation, strength grossly 5/5 equal B Palpable radial pulse equal B Incision is well healed  Assessment/Planning: 12/15/2017 Albert Stanley is a 72 y.o. year old male who is s/p removal of temporary left IJ dialysis catheter and placement of TDC as well as LUA AVG.  He is able to use the left arm for ADL's without pain or weakness.  The edema is slowly improving.  Viable left UE graft.  The graft may be accessed 4 weeks from 12/15/2017 for HD.  Roxy Horseman 12/23/2017 9:18 AM --  Laboratory Lab Results: Recent Labs    12/21/17 1648  WBC 8.4  HGB 8.4*  HCT 26.4*  PLT 202   BMET Recent Labs    12/21/17 1447  NA 131*  K 3.8  CL 94*  CO2 23  GLUCOSE 155*  BUN 44*  CREATININE 8.08*  CALCIUM 7.6*    COAG Lab Results  Component Value Date   INR 1.72 12/09/2017   INR 1.69 12/08/2017   INR 1.59 12/07/2017   No results found for: PTT

## 2017-12-24 ENCOUNTER — Inpatient Hospital Stay (HOSPITAL_COMMUNITY): Payer: Medicare Other | Admitting: Physical Therapy

## 2017-12-24 ENCOUNTER — Inpatient Hospital Stay (HOSPITAL_COMMUNITY): Payer: Medicare Other | Admitting: Speech Pathology

## 2017-12-24 ENCOUNTER — Inpatient Hospital Stay (HOSPITAL_COMMUNITY): Payer: Medicare Other

## 2017-12-24 ENCOUNTER — Inpatient Hospital Stay (HOSPITAL_COMMUNITY): Payer: Medicare Other | Admitting: Occupational Therapy

## 2017-12-24 LAB — GLUCOSE, CAPILLARY
GLUCOSE-CAPILLARY: 83 mg/dL (ref 65–99)
GLUCOSE-CAPILLARY: 104 mg/dL — AB (ref 65–99)
GLUCOSE-CAPILLARY: 60 mg/dL — AB (ref 65–99)
GLUCOSE-CAPILLARY: 54 mg/dL — AB (ref 65–99)
Glucose-Capillary: 74 mg/dL (ref 65–99)

## 2017-12-24 NOTE — Plan of Care (Signed)
  RH PAIN MANAGEMENT RH STG PAIN MANAGED AT OR BELOW PT'S PAIN GOAL Description Pain <4 with min assistance  12/24/2017 1522 - Progressing by Lysbeth Penner D, RN  Administered prn pain regimen

## 2017-12-24 NOTE — Progress Notes (Signed)
Social Work Patient ID: Albert Stanley, male   DOB: [birth year], 72 y.o.   MRN: [id number]   Reviewed team conference with pt and wife this week.  Have discussed that team anticipates pt likely to be mostly w/c level at home and will require physical assistance.  Aware that pain continues to be a very limiting factor and wife reports this is a long term issue (I.e. 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  Continue to follow for d/c planning support.  Halen Mossbarger, LCSW

## 2017-12-24 NOTE — Progress Notes (Signed)
[facility]     PROGRESS NOTE    Subjective/Complaints: States he had some shortness of breath last night.  Was told that he did not have anything available to treat.  There are no records of his albuterol being given.  Complains that his bed has a whole in the center of the mattress which makes it uncomfortable  ROS: Patient denies fever, rash, sore throat, blurred vision, nausea, vomiting, diarrhea, cough, shortness of breath or chest pain, joint or back pain, headache, or mood change.   Objective: Vital Signs: Blood pressure 111/64, pulse 80, temperature 97.8 F (36.6 C), temperature source Oral, resp. rate 16, weight 92 kg (202 lb 13.2 oz), SpO2 100 %. No results found. Recent Labs    12/21/17 1648 12/23/17 1313  WBC 8.4 6.2  HGB 8.4* 8.4*  HCT 26.4* 26.0*  PLT 202 171   Recent Labs    12/21/17 1447 12/23/17 1313  NA 131* 131*  K 3.8 4.1  CL 94* 94*  GLUCOSE 155* 97  BUN 44* 39*  CREATININE 8.08* 7.15*  CALCIUM 7.6* 8.2*   CBG (last 3)  Recent Labs    12/23/17 1918 12/23/17 2149 12/24/17 0638  GLUCAP 107* 167* 83    Wt Readings from Last 3 Encounters:  12/24/17 92 kg (202 lb 13.2 oz)  12/16/17 91.5 kg (201 lb 11.5 oz)  12/04/17 91.3 kg (201 lb 4.5 oz)    Physical Exam:  Constitutional: No distress . Vital signs reviewed. HEENT: EOMI, oral membranes moist Cardiovascular: RRR without murmur. No JVD    Respiratory: CTA Bilaterally without wheezes. Does have rales at each base. No distress  GI: BS +, non-tender, non-distended  Ext: tr to 1+ edema RLE, 2+ LLE. 1+ LUE Neurological: He is alert and oriented.  Follows commands. Fair insight and awareness.  A little impulsive Motor: RUE: 5/5 proximal to distal LUE: 3/5 proximal to 4-/5 distal RLE: 3/5 HF, KE, 4-/5 ADF LLE: HF 2-/5, KE 2+/5, 3- ADF--no change in motor exam Psych: pleasant Skin-wounds all healing without drainage  Assessment/Plan: 1. Functional deficits  and left HP secondary to right SDH which require 3+ hours per day of interdisciplinary therapy in a comprehensive inpatient rehab setting. Physiatrist is providing close team supervision and 24 hour management of active medical problems listed below. Physiatrist and rehab team continue to assess barriers to discharge/monitor patient progress toward functional and medical goals.  Function:  Bathing Bathing position   Position: Wheelchair/chair at sink  Bathing parts Body parts bathed by patient: Right arm, Left arm, Chest, Abdomen, Front perineal area, Right upper leg, Left upper leg, Right lower leg, Left lower leg, Buttocks Body parts bathed by helper: Back, Buttocks  Bathing assist Assist Level: Touching or steadying assistance(Pt > 75%), 2 helpers      Upper Body Dressing/Undressing Upper body dressing   What is the patient wearing?: Pull over shirt/dress     Pull over shirt/dress - Perfomed by patient: Thread/unthread right sleeve, Thread/unthread left sleeve, Put head through opening, Pull shirt over trunk Pull over shirt/dress - Perfomed by helper: Thread/unthread right sleeve, Thread/unthread left sleeve, Put head through opening, Pull shirt over trunk Button up shirt - Perfomed by patient: Thread/unthread right sleeve, Pull shirt around back Button up shirt - Perfomed by helper: Thread/unthread left sleeve, Button/unbutton shirt    Upper body assist Assist Level: Supervision or verbal cues      Lower Body Dressing/Undressing Lower body dressing   What is the  patient wearing?: Pants, Non-skid slipper socks   Underwear - Performed by helper: Thread/unthread right underwear leg, Thread/unthread left underwear leg, Pull underwear up/down Pants- Performed by patient: Pull pants up/down Pants- Performed by helper: Thread/unthread right pants leg, Thread/unthread left pants leg, Pull pants up/down Non-skid slipper socks- Performed by patient: Don/doff right sock, Don/doff left  sock Non-skid slipper socks- Performed by helper: Don/doff right sock, Don/doff left sock   Socks - Performed by helper: Don/doff right sock, Don/doff left sock              Lower body assist Assist for lower body dressing: 2 Helpers      Toileting Toileting Toileting activity did not occur: No continent bowel/bladder event   Toileting steps completed by helper: Adjust clothing prior to toileting, Performs perineal hygiene, Adjust clothing after toileting Toileting Assistive Devices: Other (comment)(BSC over toilet, SARA lift)  Toileting assist Assist level: Two helpers   Transfers Chair/bed transfer   Chair/bed transfer method: Squat pivot Chair/bed transfer assist level: 2 helpers Chair/bed transfer assistive device: Armrests     Locomotion Ambulation Ambulation activity did not occur: Safety/medical concerns         Wheelchair     Max wheelchair distance: 25 ft Assist Level: Touching or steadying assistance (Pt > 75%)  Cognition Comprehension Comprehension assist level: Follows basic conversation/direction with extra time/assistive device  Expression Expression assist level: Expresses basic 90% of the time/requires cueing < 10% of the time.  Social Interaction Social Interaction assist level: Interacts appropriately with others with medication or extra time (anti-anxiety, antidepressant).  Problem Solving Problem solving assist level: Solves basic 75 - 89% of the time/requires cueing 10 - 24% of the time  Memory Memory assist level: Recognizes or recalls 90% of the time/requires cueing < 10% of the time   Medical Problem List and Plan:  1. 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 DVT Prophylaxis/Anticoagulation: SCDs.    Dopplers suggesting right femoral and posterior tibial DVT and left  peroneal DVT - status post IVC filter placement on 3/9.    -local mgt of swelling 3. Pain Management: Oxycodone as needed  4. Mood: Seroquel 25 mg twice a day  5. Neuropsych: This patient is capable of making decisions on his own behalf.  6. Skin/Wound Care: Routine skin checks    -Scalp staples removed   7. Fluids/Electrolytes/Nutrition: Routine I&O's    -On regular diet and eating well. 8. End-stage renal disease. Continue hemodialysis as per renal services. Status post arteriovenous Gore-Tex graft left arm 12/15/2017  -HD scheduled after therapies to maximize therapy time/performance, T, TH, S -chronic foley -volume mgt per nephrology 9. Diabetes mellitus and peripheral neuropathy. Latest hemoglobin A1c 6.4. SSI. Check blood sugars before meals and at bedtime    -continue low-dose Glucotrol QD --improved control 10. Acute on chronic anemia. Continue Aranesp.    Hemoglobin 9.0 on 3/9 11. 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 Monitor for any signs of fluid overload   Filed Weights   12/23/17 0500 12/23/17 1250 12/24/17 0500  Weight: 92 kg (202 lb 13.2 oz) 89 kg (196 lb 3.4 oz) 92 kg (202 lb 13.2 oz)    Volume mgt ongoing per renal   -check cxr given exam and complaints last night   -albuterol nebs are available prn for wheezing/sob 12. Hypertension. Monitor with increased mobility    Blood  pressure controlled 3/14 13. MRSA PCR screening positive. Contact precautions  14. Dysphagia:  Advanced to regular texture diet on 3/8   LOS (Days) 8 A [place] EVALUATION WAS PERFORMED      [name], MD 12/24/2017 10:30 AM

## 2017-12-24 NOTE — Progress Notes (Addendum)
Occupational Therapy Session Note  Patient Details  Name: Albert Stanley MRN: [id number] Date of Birth: 1946/07/01  Today's Date: 12/24/2017 OT Individual Time: 0802-2336 OT Individual Time Calculation (min): 75 min    Short Term Goals: Week 1:  OT Short Term Goal 1 (Week 1): Pt will transfer to BSC/ toilet with mod A +1  OT Short Term Goal 1 - Progress (Week 1): Progressing toward goal OT Short Term Goal 2 (Week 1): Pt will perform sit to stands in prep for clothing mangement with mod A  OT Short Term Goal 2 - Progress (Week 1): Progressing toward goal OT Short Term Goal 3 (Week 1): Pt will self feed after setup with supervision  OT Short Term Goal 3 - Progress (Week 1): Met OT Short Term Goal 4 (Week 1): Pt maintain dynamic sitting balance with supervision during functional tasks OT Short Term Goal 4 - Progress (Week 1): Met  Skilled Therapeutic Interventions/Progress Updates:    Pt seen for morning ADL routine. Pt completed sit to stand transfer with stedy with min A manual cues posteriorly to tuck pelvis to facilitate upright posture in standing. Pt required min A to complete standing level clothing management in stedy. Pt transferred to [facility] over toilet using stedy with min A. Pt demonstrating good progress with sit to stand transfers in stedy, requiring min-mod A to stand. Pt completed UB and peri-care bathing standing in stedy with supervision. Placed pillows between knees for improved LE alignment. Pt donned pants with min A for threading R leg with catheter bag while sitting unsupported EOB. Upon doffing socks, it was observed that socks made an indentation in pt's B ankles d/t edema in B LE. Nursing notified and ted hose applied. Pt educated re edema management and skin checks. Pt completed clothing management bimanually in stedy with intermittent manual cues for thoroughness of pulling pants up posteriorly. While standing in stedy pt completed functional reaching task to facilitate  weight shifting over L side. Pt left in room with chair alarm activated, quick release belt donned, and all needs met.   Therapy Documentation Precautions:  Precautions Precautions: Fall Precaution Comments: weak left knee (had problems with it before) Restrictions Weight Bearing Restrictions: No Pain: Pain Assessment Pain Assessment: No/denies pain Pain Score: 6  ADL: ADL ADL Comments: see functional navigator  See Function Navigator for Current Functional Status.   Therapy/Group: Individual Therapy  Curtis Sites 12/24/2017, 1:30 PM

## 2017-12-24 NOTE — Progress Notes (Signed)
Physical Therapy Session Note  Patient Details  Name: Albert Stanley MRN: [id number] Date of Birth: 1946/06/25  Today's Date: 12/24/2017 PT Individual Time: 1305-1400 PT Individual Time Calculation (min): 55 min   Short Term Goals: Week 2:  PT Short Term Goal 1 (Week 2): Pt will initiate gait training for L NMR & strengthening. PT Short Term Goal 2 (Week 2): Pt will complete bed<>w/c with min assist +1.  Skilled Therapeutic Interventions/Progress Updates:    Focused on NMR during squat pivot transfers and while on Nustep to engage in reciprocal movement pattern retraining, functional use and weightbearing of LLE, and transitional movements. Pt required mod assist for squat pivot transfers and verbal and tactile cues for hand placement, technique, and anterior weightshift. Performed nustep on level 5 with BUE and BLE x 8 min with PT assisting with positioning of LLE to maintain neutral position (tendency for adducton and IR) and last 2 min pt able to maintain neutral without external assist. Pt declined standing attempts rest of session due to fatigue and pain in LLE. Agreeable to engage in UE strengthening exercises to aid with overall mobility and transfers with 4# straightweight or 3# dumbbell for chest press, overhead press, biceps curls, and rows for scapular activation x 10 reps x 2 sets with cues for technique and for upright posture. Pt requires several rest breaks throughout session due to increased fatigue.   Therapy Documentation Precautions:  Precautions Precautions: Fall Precaution Comments: weak left knee (had problems with it before) Restrictions Weight Bearing Restrictions: No   Pain: Reports received medication earlier for pain (was at 8/10 on L side of body especially LLE) requesting again at end of session.    See Function Navigator for Current Functional Status.   Therapy/Group: Individual Therapy  [name], PT, DPT  12/24/2017, 2:08  PM

## 2017-12-24 NOTE — Progress Notes (Signed)
Physical Therapy Weekly Progress Note  Patient Details  Name: Albert Stanley MRN: [id number] Date of Birth: 02/15/1946  Beginning of progress report period: December 17, 2017 End of progress report period: December 24, 2017  Today's Date: 12/24/2017  Patient has met 2 of 3 short term goals.  Pt is making slow progress towards standing & ambulation goals as pt continues to be limited by weakness but more so by LLE pain. Pt unable to achieve nor maintain LLE neutral alignment 2/2 old issues with hip. Pt's daughter was present during therapy session yesterday and educated on pt's limited progress and probability of d/c at w/c level. Pt would benefit from continued skilled PT treatment to focus on L NMR, cognitive remediation, transfers, strengthening, balance, endurance, and w/c mobility.   Patient continues to demonstrate the following deficits muscle weakness, decreased cardiorespiratory endurance, decreased coordination, decreased awareness, decreased problem solving, decreased safety awareness, decreased memory and delayed processing, and decreased standing balance, decreased postural control, hemiplegia and decreased balance strategies and therefore will continue to benefit from skilled PT intervention to increase functional independence with mobility.  Patient not progressing toward long term goals.  See goal revision..  Plan of care revisions: goals downgraded to w/c level with supervision, min/supervision for transfers, gait x 10 ft in controlled environment with therapy.  PT Short Term Goals Week 1:  PT Short Term Goal 1 (Week 1): Pt will initiate gait training.  PT Short Term Goal 1 - Progress (Week 1): Not met PT Short Term Goal 2 (Week 1): Pt will complete bed<>w/c with mod assist +1. PT Short Term Goal 2 - Progress (Week 1): Met PT Short Term Goal 3 (Week 1): Pt will propel w/c 75 ft with BUE & supervision. PT Short Term Goal 3 - Progress (Week 1): Met Week 2:  PT Short Term Goal 1 (Week  2): Pt will initiate gait training for L NMR & strengthening. PT Short Term Goal 2 (Week 2): Pt will complete bed<>w/c with min assist +1.   Therapy Documentation Precautions:  Precautions Precautions: Fall Precaution Comments: weak left knee (had problems with it before) Restrictions Weight Bearing Restrictions: No   See Function Navigator for Current Functional Status.  Therapy/Group: Individual Therapy  [name] 12/24/2017, 9:18 AM

## 2017-12-24 NOTE — Progress Notes (Signed)
[facility] Progress Note   Subjective:  LUE AVG swelling improving  HD yesterday net UF 2.7L  Doing well with rehab. No new c/os   Objective Vitals:   12/23/17 1800 12/23/17 1945 12/24/17 0211 12/24/17 0500  BP: (!) 124/58 (!) 108/54 111/64   Pulse: 94 80 80   Resp: 20  16   Temp: 98 F (36.7 C)  97.8 F (36.6 C)   TempSrc: Oral  Oral   SpO2: 98% 100% 100%   Weight:    92 kg (202 lb 13.2 oz)   Physical Exam General: WNWD elderly male in wheelchair Heart: RRR Lungs: CTAB Abdomen: soft NT Extremities: LE edema, improving legs wrapped Dialysis Access: LIJ TDC. LUE AVG+bruit   Dialysis Orders:  TTS [facility] 4h 88.5kg 2/2.25 bath P4 LUA AVF Hep 7000 Venofer 50mg  IV qHD Mircera 16mcg IV q2wks - last 2/14 Calcitriol 3.73mcg PO qHD  Sensipar 30mg  qd Renvela 800mg - 2AC TID, 2 w/snacks  HD THS schedule using TDC: 3K, 4h, target 3L UF BP permitting, no heparin   Assessment/Plan: 1. S/p Fall and SDH with craniotomy 2/24, now in CIR  2. ESRD - TTS. Continue on schedule. Next HD 3/16 3. Vasuclar access - LIJ TDC/LUE AVG placed 3/6, swelling improving 4. Anemia - Aranesp 150 qSat  5. MBD-  VDRA/Renvela binder/Sensipar  6. HTN/volume - BP controlled/volume stable  7. Nutrition - Renal diet/viatins  8. LE DVT s/p IVC filter 3/9 9. DM2  10. Chronic indwelling foley hx bladder cancer    Lynnda Child PA-C [facility] Pager [phone] 12/24/2017,10:20 AM  LOS: 8 days   Additional Objective Labs: Basic Metabolic Panel: Recent Labs  Lab 12/18/17 1914 12/21/17 1447 12/23/17 1313  NA 132* 131* 131*  K 3.4* 3.8 4.1  CL 94* 94* 94*  CO2 25 23 24   GLUCOSE 142* 155* 97  BUN 20 44* 39*  CREATININE 3.82* 8.08* 7.15*  CALCIUM 7.9* 7.6* 8.2*  PHOS 4.0 5.4* 4.4   CBC: Recent Labs  Lab 12/18/17 1914 12/21/17 1648 12/23/17 1313  WBC 7.5 8.4 6.2  HGB 9.0* 8.4* 8.4*  HCT 28.3* 26.4* 26.0*  MCV 94.0 93.3 95.2  PLT 215 202 171    Blood Culture    Component Value Date/Time   SDES URINE, CATHETERIZED 06/26/2017 1652   SPECREQUEST NONE 06/26/2017 1652   CULT >=100,000 COLONIES/mL SERRATIA MARCESCENS (A) 06/26/2017 1652   REPTSTATUS 06/28/2017 FINAL 06/26/2017 1652    Cardiac Enzymes: No results for input(s): CKTOTAL, CKMB, CKMBINDEX, TROPONINI in the last 168 hours. CBG: Recent Labs  Lab 12/23/17 1748 12/23/17 1837 12/23/17 1918 12/23/17 2149 12/24/17 0638  GLUCAP 60* 61* 107* 167* 83   Iron Studies: No results for input(s): IRON, TIBC, TRANSFERRIN, FERRITIN in the last 72 hours. Lab Results  Component Value Date   INR 1.72 12/09/2017   INR 1.69 12/08/2017   INR 1.59 12/07/2017   Medications:  . allopurinol  300 mg Oral Daily  . amiodarone  200 mg Oral Daily  . calcitRIOL  0.5 mcg Oral Q T,Th,Sa-HD  . darbepoetin (ARANESP) injection - DIALYSIS  150 mcg Intravenous Q Sat-HD  . feeding supplement (PRO-STAT SUGAR FREE 64)  30 mL Oral BID  . glipiZIDE  2.5 mg Oral QAC breakfast  . insulin aspart  0-9 Units Subcutaneous TID WC  . multivitamin  1 tablet Oral QHS  . mupirocin ointment   Nasal BID  . oxybutynin  5 mg Oral BID  . pantoprazole  40  mg Oral QHS  . polyethylene glycol  17 g Oral Daily  . senna-docusate  2 tablet Oral QHS  . sevelamer carbonate  1,600 mg Oral TID WC  . sodium chloride flush  10-40 mL Intracatheter Q12H

## 2017-12-24 NOTE — Patient Care Conference (Signed)
Inpatient RehabilitationTeam Conference and Plan of Care Update Date: 12/21/2017   Time: 2:35 PM    Patient Name: Albert Stanley      Medical Record Number: [id number]  Date of Birth: [birth year] Sex: Male         Room/Bed: 4W07C/4W07C-01 Payor Info: Payor: [employer] MEDICARE / Plan: [employer] MEDICARE / Product Type: *No Product type* /    Admitting Diagnosis: TBI  Admit Date/Time:  12/16/2017  5:55 PM Admission Comments: No comment available   Primary Diagnosis:  Traumatic subdural hematoma ([name]) Principal Problem: Traumatic subdural hematoma [facility])  Patient Active Problem List   Diagnosis Date Noted  . Labile blood pressure   . Essential hypertension   . Acute on chronic diastolic heart failure ([place])   . Type 2 diabetes mellitus with peripheral neuropathy ([name])   . Acute deep vein thrombosis (DVT) of femoral vein of right lower extremity ([place])   . Traumatic subdural hematoma ([place]) 12/16/2017  . ESRD (end stage renal disease) ([place])   . ESRD (end stage renal disease) on dialysis ([place])   . Dysphagia   . Acute blood loss anemia   . Anemia of chronic disease   . Chronic diastolic congestive heart failure ([place])   . Diabetes mellitus type 2 in nonobese ([name])   . Benign essential HTN   . Nonischemic cardiomyopathy ([place])   . Transaminitis   . Encounter for central line placement   . Endotracheally intubated   . SDH (subdural hematoma) ([place])   . Ventilator dependent ([place])   . Chronic renal impairment   . Chronic in-center hemodialysis status ([place])   . S/P craniotomy 12/05/2017  . Hyperkalemia 12/02/2017  . Malnutrition of moderate degree 12/18/2016  . PVC's (premature ventricular contractions)   . Anemia associated with chronic renal failure   . End-stage renal disease on hemodialysis ([place]) 12/09/2016  . Dyslipidemia associated with type 2 diabetes mellitus ([place]) 12/09/2016  . Acute on chronic systolic and diastolic heart failure, NYHA class 1 ([place]) 12/09/2016  . 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 Type 2 diabetes mellitus ([place]) 12/11/2015  . Fluid overload 12/06/2015  . History of DVT (deep vein thrombosis) 12/04/2015  . Renal hematoma 12/04/2015  . H/O: gout 11/16/2013  . Trochanteric bursitis 10/17/2013  . 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 Lumbar degenerative disc disease 07/17/2013    Expected Discharge Date: Expected Discharge Date: 01/07/18  Team Members Present: Physician leading conference: Dr. Alger Simons Social Worker Present: Lennart Pall, LCSW Nurse Present: Dorthula Nettles, RN PT Present: Lavone Nian, PT OT Present: Benay Pillow, OT SLP Present: Weston Anna, SLP PPS Coordinator present : Daiva Nakayama, RN, CRRN     Current Status/Progress Goal Weekly Team Focus  Medical   TBI with crani. left hemiparesis, dysphagia. constipated  improve activity tolerance   bp control, nutrition, bowels   Bowel/Bladder   Chronic Foley hx of bladder cancer, oliguria, last BM 12/21/17 after several laxative, Suppository this early morning  Continent of Bowel/ continue Foley  Assess and evaluate BM aggressively provide Laxative with follow up,    Swallow/Nutrition/ Hydration   Regular textures with thin liquids, Mod I  Mod I   tolerance    ADL's   mod - max A squat pivot, max - total A (stedy) for standing, UB and LB self care total A  supervision - min A overall, will likely need to be downgraded  strength, balance, functional transfers/mobility, endurance, pt  edu   Mobility   max assist transfers, min assist w/c mobility, limited by pain in L knee, poor endurance  min assist overall, will probably need to be downgraded  activity tolerance, standing tolerance, L NMR, balance, transfers, pre-gait   Communication             Safety/Cognition/ Behavioral Observations  Mod A   Supervision  problem solving, safety, recall and awareness   Pain   Pain wo Left hip, back and occassional  headach, s/p cranio, meciated x2 this shift Percocet pain score 8/10 relief 5/10. has Neuropathy pain to feet and LE;s  < 3   Assess and provided medication with f/u evaluation, Has prn    Skin   s/p Cranio site with staple reoned on 12/20/17, no drainage oe dressing , open air, monitor, Lf air edematous s/p procedure with prior Dialysis,cath,. has IJ catheter to left upper chest -double for HD  Maintain skin integrity, no new skin injuries or abrasions, tears, etc  Assess QS and prn,surgical and non surgical areas,monitor for s/s infection    Rehab Goals Patient on target to meet rehab goals: Yes *See Care Plan and progress notes for long and short-term goals.     Barriers to Discharge  Current Status/Progress Possible Resolutions Date Resolved   Physician    Medical stability        treat medical conditions      Nursing                  PT  Decreased caregiver support                 OT                  SLP                SW                Discharge Planning/Teaching Needs:  Plans for pt to return home with wife who is working to arrange 24/7 assistance via her insurance coverage and family.  Teaching to be ongoing.   Team Discussion:  DVT - IVC placed.  Mod assist squat-pivot transfers.  Left knee turns in;  C/o much pain which is significantly limiting.  Pt states he plans to purchase a SARA lift.  Still taking +2 at times with ADLs.  Intermittent confusion.  Concern about reaching a level of function that family could provide at home.  Revisions to Treatment Plan:  None    Continued Need for Acute Rehabilitation Level of Care: The patient requires daily medical management by a physician with specialized training in physical medicine and rehabilitation for the following conditions: Daily direction of a multidisciplinary physical rehabilitation program to ensure safe treatment while eliciting the highest outcome that is of practical value to the patient.: Yes Daily medical  management of patient stability for increased activity during participation in an intensive rehabilitation regime.: Yes Daily analysis of laboratory values and/or radiology reports with any subsequent need for medication adjustment of medical intervention for : Neurological problems;Blood pressure problems  Nakesha Ebrahim 12/24/2017, 4:25 PM

## 2017-12-24 NOTE — Progress Notes (Signed)
Speech Language Pathology Weekly Progress and Session Note  Patient Details  Name: Albert Stanley MRN: [id number] Date of Birth: 1946/01/15  Beginning of progress report period: December 17, 2017 End of progress report period: December 24, 2017  Today's Date: 12/24/2017 SLP Individual Time: 1015-1100 SLP Individual Time Calculation (min): 45 min  Short Term Goals: Week 1: SLP Short Term Goal 1 (Week 1): Pt will consume regular textures and thin liquids with mod I use of swallowing precautions and minimal overt s/s of aspiration.  SLP Short Term Goal 1 - Progress (Week 1): Met SLP Short Term Goal 2 (Week 1): Pt will selectively attend to tasks in a moderately distracting environment for 15 minutes with supervision cues for redirection to task.   SLP Short Term Goal 2 - Progress (Week 1): Met SLP Short Term Goal 3 (Week 1): Pt will complete semi-complex tasks with min verbal cues for functional problem solving.   SLP Short Term Goal 3 - Progress (Week 1): Not met SLP Short Term Goal 4 (Week 1): Pt will recall semi-complex daily information with min verbal cues for use of external aids.   SLP Short Term Goal 4 - Progress (Week 1): Met SLP Short Term Goal 5 (Week 1): Pt will recognize and correct errors in the moment during structured tasks with min verbal cues.   SLP Short Term Goal 5 - Progress (Week 1): Not met    New Short Term Goals: Week 2: SLP Short Term Goal 1 (Week 2): Pt will selectively attend to tasks in a moderately distracting environment for 30 minutes with supervision cues for redirection to task.   SLP Short Term Goal 2 (Week 2): Pt will complete semi-complex tasks with Min A verbal cues for functional problem solving.   SLP Short Term Goal 3 (Week 2): Pt will recall semi-complex daily information with Supervision verbal cues for use of external aids.   SLP Short Term Goal 4 (Week 2): Pt will recognize and correct errors in the moment during structured tasks with Min A verbal  cues.    Weekly Progress Updates: Pt has made functional gains and has met 3 out of 5 STG's this reporting period. Currently, pt requires overall Mod A verbal cues for semi-complex problem solving and Min A verbal cues for recall during functional tasks. Pt able to self-monitor and correct errors with Mod A verbal cues. Pt demonstrates selective attention in a moderately distracting environment for ~20 minutes with Supervision verbal cues for redirection. Pt also consumes regular textures with thin liquids without overt s/s of aspiration and demonstrates use of swallowing compensatory strategies with Mod I. Patient and family education is ongoing. Patient would benefit from continued skilled SLP intervention to maximize his cognitive function and overall functional independence prior to discharge.      Intensity: Minumum of 1-2 x/day, 30 to 90 minutes Frequency: 3 to 5 out of 7 days Duration/Length of Stay: 3/29 Treatment/Interventions: Cognitive remediation/compensation;Cueing hierarchy;Dysphagia/aspiration precaution training;Patient/family education;Environmental controls;Functional tasks;Internal/external aids;Therapeutic Activities   Daily Session  Skilled Therapeutic Interventions: Skilled treatment session focused on cognitive goals. SLP facilitated session by providing Mod A verbal cues for problem solving during a complex medication management task. Pt required Min A verbal cues for recall and Mod A verbal cues to self-monitor and correct errors throughout the task. Pt demonstrated selective attention in a moderately distracting environment for ~20 minutes with Supervision verbal cues. Pt left upright in wheelchair with alarm on, quick release belt in place, and all needs within  reach. Continue with current plan of care.      Function:   Cognition Comprehension Comprehension assist level: Follows basic conversation/direction with extra time/assistive device  Expression   Expression  assist level: Expresses basic 90% of the time/requires cueing < 10% of the time.  Social Interaction Social Interaction assist level: Interacts appropriately with others with medication or extra time (anti-anxiety, antidepressant).  Problem Solving Problem solving assist level: Solves basic 50 - 74% of the time/requires cueing 25 - 49% of the time  Memory Memory assist level: Recognizes or recalls 75 - 89% of the time/requires cueing 10 - 24% of the time     Pain Pain Assessment Pain Assessment: No/denies pain   Therapy/Group: Individual Therapy  Meredeth Ide  SLP - Student 12/24/2017, 12:41 PM

## 2017-12-24 NOTE — Progress Notes (Addendum)
Occupational Therapy Weekly Progress Note  Patient Details  Name: Albert Stanley MRN: [id number] Date of Birth: [birth year]  Beginning of progress report period: December 17, 2017 End of progress report period: December 25, 2017  Patient has met 2 of 4 short term goals and is making good overall progress in therapy so far. Pt exhibits increased orientation, memory, and safety awareness overall throughout sessions, anticipating deficits in his L knee's stability and brainstorming solutions. Pt has progressed from a total A+2 with transfers to max-mod A with 1. Pt can now use stedy lift with min A and maintain standing balance to remove B UE from stedy to perform functional reaching during ADLs. Pt's dynamic sitting balance has improved to supervision levels while sitting EOB unsupported. Pt is able to weight shift over his L side with cueing, but still requires heavy manual assist to maintain L knee in midline without deviations. Will continue to address stated deficits to further increase functional independence and facilitate safe transition to next venue of care or home with family.   Patient continues to demonstrate the following deficits: muscle weakness, reduced functional activity tolerance, decreased coordination and decreased standing balance and therefore will continue to benefit from skilled OT intervention to enhance overall performance with BADL and iADL.  Patient progressing toward long term goals..  Continue plan of care.  OT Short Term Goals Week 1:  OT Short Term Goal 1 (Week 1): Pt will transfer to BSC/ toilet with mod A +1  OT Short Term Goal 1 - Progress (Week 1): Progressing toward goal OT Short Term Goal 2 (Week 1): Pt will perform sit to stands in prep for clothing mangement with mod A  OT Short Term Goal 2 - Progress (Week 1): Progressing toward goal OT Short Term Goal 3 (Week 1): Pt will self feed after setup with supervision  OT Short Term Goal 3 - Progress (Week 1): Met OT  Short Term Goal 4 (Week 1): Pt maintain dynamic sitting balance with supervision during functional tasks OT Short Term Goal 4 - Progress (Week 1): Met   Therapy Documentation Precautions:  Precautions Precautions: Fall Precaution Comments: weak left knee (had problems with it before) Restrictions Weight Bearing Restrictions: No Pain: Pain Assessment Pain Assessment: No/denies pain Pain Score: 6  ADL: ADL ADL Comments: see functional navigator  See Function Navigator for Current Functional Status.   Therapy/Group: Individual Therapy  Curtis Sites 12/24/2017, 1:29 PM

## 2017-12-24 NOTE — Progress Notes (Signed)
Physical Therapy Note  Patient Details  Name: Albert Stanley MRN: [id number] Date of Birth: 1946/04/04 Today's Date: 12/24/2017    Time: 1415-1442 27 minutes  1:1 Pt c/o pain in Lt LE, RN aware and meds given during session.  Pt asks to get back in bed.  Pt performs squat pivot transfer to bed with mod A. Pt performs lateral scooting edge of bed with min A.  Sit to supine with mod A.  Rolling with min A with total A to doff pants.  Pt requires min A and use of bedrails to pull himself up in bed. Pt positioned for comfort and left in bed with needs at hand, alarm set.   DONAWERTH,KAREN 12/24/2017, 2:46 PM

## 2017-12-25 ENCOUNTER — Inpatient Hospital Stay (HOSPITAL_COMMUNITY): Payer: Medicare Other | Admitting: Occupational Therapy

## 2017-12-25 LAB — GLUCOSE, CAPILLARY
GLUCOSE-CAPILLARY: 84 mg/dL (ref 65–99)
GLUCOSE-CAPILLARY: 125 mg/dL — AB (ref 65–99)
Glucose-Capillary: 63 mg/dL — ABNORMAL LOW (ref 65–99)
Glucose-Capillary: 191 mg/dL — ABNORMAL HIGH (ref 65–99)

## 2017-12-25 MED ORDER — PENTAFLUOROPROP-TETRAFLUOROETH EX AERO: 1.0000 "application " | INHALATION_SPRAY | CUTANEOUS | Status: DC | PRN

## 2017-12-25 MED ORDER — DICLOFENAC SODIUM 1 % TD GEL
2.0000 g | Freq: Three times a day (TID) | TRANSDERMAL | Status: DC
  Administered 2017-12-26 – 2018-01-05 (×24): 2 g via TOPICAL
  Filled 2017-12-25: qty 100

## 2017-12-25 MED ORDER — LIDOCAINE-PRILOCAINE 2.5-2.5 % EX CREA: 1.0000 "application " | TOPICAL_CREAM | CUTANEOUS | Status: DC | PRN

## 2017-12-25 MED ORDER — SODIUM CHLORIDE 0.9 % IV SOLN: 100.0000 mL | INTRAVENOUS | Status: DC | PRN

## 2017-12-25 MED ORDER — LIDOCAINE HCL (PF) 1 % IJ SOLN: 5.0000 mL | INTRAMUSCULAR | Status: DC | PRN

## 2017-12-25 MED ORDER — CALCITRIOL 0.5 MCG PO CAPS
ORAL_CAPSULE | ORAL | Status: AC
  Administered 2017-12-25: 0.5 ug via ORAL
  Filled 2017-12-25: qty 1

## 2017-12-25 MED ORDER — DARBEPOETIN ALFA 150 MCG/0.3ML IJ SOSY
PREFILLED_SYRINGE | INTRAMUSCULAR | Status: AC
  Administered 2017-12-25: 150 ug via INTRAVENOUS
  Filled 2017-12-25: qty 0.3

## 2017-12-25 NOTE — Progress Notes (Signed)
[facility] Progress Note   Subjective:  No new issues for HD today  Objective Vitals:   12/24/17 0211 12/24/17 0500 12/24/17 1453 12/25/17 0358  BP: 111/64  113/62 (!) 150/98  Pulse: 80  67 89  Resp: 16  18 18   Temp: 97.8 F (36.6 C)  97.9 F (36.6 C) 98.1 F (36.7 C)  TempSrc: Oral  Oral Oral  SpO2: 100%  97% 100%  Weight:  92 kg (202 lb 13.2 oz)     Physical Exam General: WNWD elderly male in bed Heart: RRR Lungs: CTAB Abdomen: soft NT Extremities: LE edema, improving legs wrapped Dialysis Access: LIJ TDC. LUE AVG+bruit   Dialysis Orders:  TTS [facility] 4h 88.5kg 2/2.25 bath P4 LUA AVF Hep 7000 Venofer 50mg  IV qHD Mircera 113mcg IV q2wks - last 2/14 Calcitriol 3.82mcg PO qHD  Sensipar 30mg  qd Renvela 800mg - 2AC TID, 2 w/snacks  HD THS schedule using TDC: 3K, 4h, target 3L UF BP permitting, no heparin   Assessment/Plan: 1. S/p Fall and SDH with craniotomy 2/24, now in CIR  2. ESRD - TTS. Continue on schedule. Next HD Today 3. Vasuclar access - LIJ TDC/LUE AVG placed 3/6, swelling improving 4. Anemia - Aranesp 150 qSat  5. MBD-  VDRA/Renvela binder/Sensipar  6. HTN/volume - BP controlled/volume stable  7. Nutrition - Renal diet/viatins  8. LE DVT s/p IVC filter 3/9 9. DM2  10. Chronic indwelling foley hx bladder cancer    Lynnda Child PA-C [facility] Pager [phone] 12/25/2017,9:34 AM  LOS: 9 days   Additional Objective Labs: Basic Metabolic Panel: Recent Labs  Lab 12/18/17 1914 12/21/17 1447 12/23/17 1313  NA 132* 131* 131*  K 3.4* 3.8 4.1  CL 94* 94* 94*  CO2 25 23 24   GLUCOSE 142* 155* 97  BUN 20 44* 39*  CREATININE 3.82* 8.08* 7.15*  CALCIUM 7.9* 7.6* 8.2*  PHOS 4.0 5.4* 4.4   CBC: Recent Labs  Lab 12/18/17 1914 12/21/17 1648 12/23/17 1313  WBC 7.5 8.4 6.2  HGB 9.0* 8.4* 8.4*  HCT 28.3* 26.4* 26.0*  MCV 94.0 93.3 95.2  PLT 215 202 171   Blood Culture    Component Value Date/Time   SDES URINE, CATHETERIZED 06/26/2017 1652   SPECREQUEST NONE 06/26/2017 1652   CULT >=100,000 COLONIES/mL SERRATIA MARCESCENS (A) 06/26/2017 1652   REPTSTATUS 06/28/2017 FINAL 06/26/2017 1652    Cardiac Enzymes: No results for input(s): CKTOTAL, CKMB, CKMBINDEX, TROPONINI in the last 168 hours. CBG: Recent Labs  Lab 12/24/17 1142 12/24/17 1641 12/24/17 1732 12/24/17 2135 12/25/17 0648  GLUCAP 104* 60* 54* 74 84   Iron Studies: No results for input(s): IRON, TIBC, TRANSFERRIN, FERRITIN in the last 72 hours. Lab Results  Component Value Date   INR 1.72 12/09/2017   INR 1.69 12/08/2017   INR 1.59 12/07/2017   Medications:  . allopurinol  300 mg Oral Daily  . amiodarone  200 mg Oral Daily  . calcitRIOL  0.5 mcg Oral Q T,Th,Sa-HD  . darbepoetin (ARANESP) injection - DIALYSIS  150 mcg Intravenous Q Sat-HD  . diclofenac sodium  2 g Topical TID  . feeding supplement (PRO-STAT SUGAR FREE 64)  30 mL Oral BID  . glipiZIDE  2.5 mg Oral QAC breakfast  . insulin aspart  0-9 Units Subcutaneous TID WC  . multivitamin  1 tablet Oral QHS  . mupirocin ointment   Nasal BID  . oxybutynin  5 mg Oral BID  . pantoprazole  40 mg Oral QHS  .  polyethylene glycol  17 g Oral Daily  . senna-docusate  2 tablet Oral QHS  . sevelamer carbonate  1,600 mg Oral TID WC  . sodium chloride flush  10-40 mL Intracatheter Q12H

## 2017-12-25 NOTE — Progress Notes (Signed)
Occupational Therapy Session Note  Patient Details  Name: DERREN SUYDAM MRN: [id number] Date of Birth: 1946-07-29  Today's Date: 12/25/2017 OT Individual Time: 1103-1594 OT Individual Time Calculation (min): 60 min    Skilled Therapeutic Interventions/Progress Updates: Much of session was spent with patient sitting on toilet after bed to 3:1 toilet transfer via Clarise Cruz.    He was able to stand x 4 for approx 6 minutes per try for pericleansing and standing balance.    He required max A for pericleansing and washing in standing position on Sara as he preferred to work on standing balance.    His left foot tended to slide off to left side of Sara pedal during Clarise Cruz transit but stayed in place during all his standing opportunitities.  He was assisted back to bed at end of session in preparation for transportation to Dialysis unit.  Wife present for session, and she and patient asked questions and shared information regarding there homesetup and his past medical challenges that still affect him (left knee and his back)  Wife stayed with patient after this clinician left the room.  (Prior to this returning to bed, this clinician and his NT changed out his bed to the air mattress type bed that awaited  Him in the hallway.)     Therapy Documentation Precautions:  Precautions Precautions: Fall Precaution Comments: weak left knee (had problems with it before) Restrictions Weight Bearing Restrictions: No  Pain: Pain Assessment Pain Assessment: No/denies pain Pain Score: 0-No pain     Therapy/Group: Individual Therapy  Alfredia Ferguson [facility] 12/25/2017, 6:32 PM

## 2017-12-25 NOTE — Progress Notes (Signed)
[place] PH[facility] MEDICINE & REHABILITATION     PROGRESS NOTE    Subjective/Complaints: Still struggling with bed.  States that there is a "hole in the bed".  Apparently mattress was not changed out yesterday.  Wife is present this morning and has multiple questions regarding his left side and his history of orthopedic issues and asking why orthopedic surgery is not seeing her husband.  ROS: Patient denies fever, rash, sore throat, blurred vision, nausea, vomiting, diarrhea, cough, shortness of breath or chest pain, joint or back pain, headache, or mood change.    Objective: Vital Signs: Blood pressure (!) 150/98, pulse 89, temperature 98.1 F (36.7 C), temperature source Oral, resp. rate 18, weight 92 kg (202 lb 13.2 oz), SpO2 100 %. Dg Chest 2 View  Result Date: 12/24/2017 CLINICAL DATA:  Shortness of breath. EXAM: CHEST - 2 VIEW COMPARISON:  Chest x-ray 12/15/2017. FINDINGS: Dual-lumen catheter noted with scratched it left IJ dual-lumen catheter with tip over right atrium. Cardiomegaly with pulmonary venous congestion. Low lung volumes with mild basilar atelectasis. No pleural effusion or pneumothorax. IVC filter noted over the region of the upper IVC. No acute bony abnormality. IMPRESSION: 1.  Left IJ dual-lumen catheter with tip over the right atrium. 2.  Cardiomegaly with mild pulmonary venous congestion. 3.  Low lung volumes with mild basilar atelectasis. Electronically Signed   By: Marcello Moores  Register   On: 12/24/2017 13:06   Recent Labs    12/23/17 1313  WBC 6.2  HGB 8.4*  HCT 26.0*  PLT 171   Recent Labs    12/23/17 1313  NA 131*  K 4.1  CL 94*  GLUCOSE 97  BUN 39*  CREATININE 7.15*  CALCIUM 8.2*   CBG (last 3)  Recent Labs    12/24/17 1732 12/24/17 2135 12/25/17 0648  GLUCAP 54* 74 84    Wt Readings from Last 3 Encounters:  12/24/17 92 kg (202 lb 13.2 oz)  12/16/17 91.5 kg (201 lb 11.5 oz)  12/04/17 91.3 kg (201 lb 4.5 oz)    Physical Exam:   Constitutional: No distress . Vital signs reviewed. HEENT: EOMI, oral membranes moist. Wife had placed hydrocortisone cream on inicision Cardiovascular: RRR without murmur. No JVD    Respiratory: CTA Bilaterally without wheezes or rales. Normal effort    GI: BS +, non-tender, non-distended   Ext: tr to 1+ edema RLE, 2+ LLE. 1+ LUE Musc: left hip and knee tenderness, left genu valgus deformity Neurological: He is alert and oriented.  Follows commands. Fair insight and awareness.  A little impulsive Motor: RUE: 5/5 proximal to distal LUE: 3/5 proximal to 4-/5 distal RLE: 3/5 HF, KE, 4-/5 ADF LLE: HF 2-/5, KE 2+/5, 3- ADF--motor exam stable Psych: pleasant Skin-wounds all healing without drainage  Assessment/Plan: 1. Functional deficits and left HP secondary to right SDH which require 3+ hours per day of interdisciplinary therapy in a comprehensive inpatient rehab setting. Physiatrist is providing close team supervision and 24 hour management of active medical problems listed below. Physiatrist and rehab team continue to assess barriers to discharge/monitor patient progress toward functional and medical goals.  Function:  Bathing Bathing position   Position: Other (comment)(standing in stedy)  Bathing parts Body parts bathed by patient: Right arm, Left arm, Chest, Abdomen, Front perineal area, Right upper leg, Left upper leg, Right lower leg, Left lower leg, Buttocks Body parts bathed by helper: Back  Bathing assist Assist Level: Touching or steadying assistance(Pt > 75%), 2 helpers  Upper Body Dressing/Undressing Upper body dressing   What is the patient wearing?: Pull over shirt/dress     Pull over shirt/dress - Perfomed by patient: Thread/unthread right sleeve, Thread/unthread left sleeve, Put head through opening, Pull shirt over trunk Pull over shirt/dress - Perfomed by helper: Thread/unthread right sleeve, Thread/unthread left sleeve, Put head through opening, Pull shirt  over trunk Button up shirt - Perfomed by patient: Thread/unthread right sleeve, Pull shirt around back Button up shirt - Perfomed by helper: Thread/unthread left sleeve, Button/unbutton shirt    Upper body assist Assist Level: Supervision or verbal cues      Lower Body Dressing/Undressing Lower body dressing   What is the patient wearing?: Pants, Non-skid slipper socks   Underwear - Performed by helper: Thread/unthread right underwear leg, Thread/unthread left underwear leg, Pull underwear up/down Pants- Performed by patient: Thread/unthread left pants leg, Pull pants up/down Pants- Performed by helper: Thread/unthread right pants leg Non-skid slipper socks- Performed by patient: Don/doff right sock, Don/doff left sock Non-skid slipper socks- Performed by helper: Don/doff right sock, Don/doff left sock   Socks - Performed by helper: Don/doff right sock, Don/doff left sock              Lower body assist Assist for lower body dressing: Touching or steadying assistance (Pt > 75%)      Toileting Toileting Toileting activity did not occur: No continent bowel/bladder event Toileting steps completed by patient: Adjust clothing prior to toileting, Performs perineal hygiene, Adjust clothing after toileting Toileting steps completed by helper: Adjust clothing prior to toileting, Performs perineal hygiene, Adjust clothing after toileting Toileting Assistive Devices: Other (comment)(stedy lift)  Toileting assist Assist level: Touching or steadying assistance (Pt.75%)   Transfers Chair/bed transfer   Chair/bed transfer method: Squat pivot Chair/bed transfer assist level: Moderate assist (Pt 50 - 74%/lift or lower) Chair/bed transfer assistive device: Armrests Mechanical lift: Stedy   Locomotion Ambulation Ambulation activity did not occur: Safety/medical concerns         Wheelchair     Max wheelchair distance: 25 ft Assist Level: Supervision or verbal cues   Cognition Comprehension Comprehension assist level: Follows basic conversation/direction with extra time/assistive device  Expression Expression assist level: Expresses basic 90% of the time/requires cueing < 10% of the time.  Social Interaction Social Interaction assist level: Interacts appropriately with others with medication or extra time (anti-anxiety, antidepressant).  Problem Solving Problem solving assist level: Solves basic 50 - 74% of the time/requires cueing 25 - 49% of the time  Memory Memory assist level: Recognizes or recalls 75 - 89% of the time/requires cueing 10 - 24% of the time   Medical Problem List and Plan:  1. 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 Had an extensive conversation with wife re: plan and multiple medical issues 2. DVT Prophylaxis/Anticoagulation: SCDs.    Dopplers suggesting right femoral and posterior tibial DVT and left peroneal DVT - status post IVC filter placement on 3/9.    -local mgt of swelling 3. 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 Mood: Seroquel 25 mg twice a day  5. Neuropsych: This patient is capable of making decisions on his own behalf.  6. Skin/Wound Care: Routine skin checks    -  Scalp staples removed   7. Fluids/Electrolytes/Nutrition: Routine I&O's    -On regular diet and eating well. 8. End-stage renal disease. Continue hemodialysis as per renal services. Status post arteriovenous Gore-Tex graft left arm 12/15/2017  -HD scheduled after therapies to maximize therapy time/performance, T, TH, S -chronic foley -volume mgt per nephrology 9. Diabetes mellitus and peripheral neuropathy.  Latest hemoglobin A1c 6.4. SSI. Check blood sugars before meals and at bedtime    -continue low-dose Glucotrol QD --improved control overall 10. Acute on chronic anemia. Continue Aranesp.    Hemoglobin 9.0 on 3/9 11. 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 Monitor for any signs of fluid overload   Filed Weights   12/23/17 0500 12/23/17 1250 12/24/17 0500  Weight: 92 kg (202 lb 13.2 oz) 89 kg (196 lb 3.4 oz) 92 kg (202 lb 13.2 oz)    Volume mgt ongoing per renal   -cxr reviewed, mild congestion   -encouraged IS   -albuterol nebs are available prn for wheezing/sob 12. Hypertension. Monitor with increased mobility    Blood pressure elevated this morning. HD today 13. MRSA PCR screening positive. Contact precautions  14. Dysphagia:  Advanced to regular texture diet on 3/8   LOS (Days) 9 A FACE TO FACE EVALUATION WAS PERFORMED      [name], MD 12/25/2017 8:38 AM

## 2017-12-26 ENCOUNTER — Inpatient Hospital Stay (HOSPITAL_COMMUNITY): Payer: Medicare Other | Admitting: Occupational Therapy

## 2017-12-26 LAB — GLUCOSE, CAPILLARY
GLUCOSE-CAPILLARY: 87 mg/dL (ref 65–99)
Glucose-Capillary: 79 mg/dL (ref 65–99)
Glucose-Capillary: 154 mg/dL — ABNORMAL HIGH (ref 65–99)
Glucose-Capillary: 119 mg/dL — ABNORMAL HIGH (ref 65–99)

## 2017-12-26 NOTE — Progress Notes (Signed)
[place] [facility] PHYSICAL MEDICINE & REHABILITATION     PROGRESS NOTE    Subjective/Complaints: Patient has complaints about the bed again.  I have asked for his mattress or bed to be switched out but he was changed to an air mattress instead.  Was finally able to get some sleep last night.  ROS: Patient denies fever, rash, sore throat, blurred vision, nausea, vomiting, diarrhea, cough, shortness of breath or chest pain, joint or back pain, headache, or mood change.   Objective: Vital Signs: Blood pressure 114/71, pulse 64, temperature 98 F (36.7 C), temperature source Oral, resp. rate 18, weight 92.2 kg (203 lb 4.2 oz), SpO2 97 %. Dg Chest 2 View  Result Date: 12/24/2017 CLINICAL DATA:  Shortness of breath. EXAM: CHEST - 2 VIEW COMPARISON:  Chest x-ray 12/15/2017. FINDINGS: Dual-lumen catheter noted with scratched it left IJ dual-lumen catheter with tip over right atrium. Cardiomegaly with pulmonary venous congestion. Low lung volumes with mild basilar atelectasis. No pleural effusion or pneumothorax. IVC filter noted over the region of the upper IVC. No acute bony abnormality. IMPRESSION: 1.  Left IJ dual-lumen catheter with tip over the right atrium. 2.  Cardiomegaly with mild pulmonary venous congestion. 3.  Low lung volumes with mild basilar atelectasis. Electronically Signed   By: Marcello Moores  Register   On: 12/24/2017 13:06   Recent Labs    12/23/17 1313  WBC 6.2  HGB 8.4*  HCT 26.0*  PLT 171   Recent Labs    12/23/17 1313  NA 131*  K 4.1  CL 94*  GLUCOSE 97  BUN 39*  CREATININE 7.15*  CALCIUM 8.2*   CBG (last 3)  Recent Labs    12/25/17 1828 12/25/17 2057 12/26/17 0702  GLUCAP 63* 191* 79    Wt Readings from Last 3 Encounters:  12/25/17 92.2 kg (203 lb 4.2 oz)  12/16/17 91.5 kg (201 lb 11.5 oz)  12/04/17 91.3 kg (201 lb 4.5 oz)    Physical Exam:  Constitutional: No distress . Vital signs reviewed. HEENT: EOMI, oral membranes moist Cardiovascular: RRR without  murmur. No JVD    Respiratory: CTA Bilaterally without wheezes or rales. Normal effort    GI: BS +, non-tender, non-distended   Ext: tr to 1+ edema RLE, 2+ LLE. 1+ LUE--- no change in edema Musc: left hip and knee tenderness, left genu valgus deformity Neurological: He is alert and oriented.   . Fair insight and awareness.    Motor: RUE: 5/5 proximal to distal LUE: 3/5 proximal to 4-/5 distal RLE: 3/5 HF, KE, 4-/5 ADF LLE: HF 2-/5, KE 2+/5, 3- ADF--motor exam stable Psych: pleasant and cooperative. Skin-wounds all healing without drainage  Assessment/Plan: 1. Functional deficits and left HP secondary to right SDH which require 3+ hours per day of interdisciplinary therapy in a comprehensive inpatient rehab setting. Physiatrist is providing close team supervision and 24 hour management of active medical problems listed below. Physiatrist and rehab team continue to assess barriers to discharge/monitor patient progress toward functional and medical goals.  Function:  Bathing Bathing position   Position: Other (comment)(standing in stedy)  Bathing parts Body parts bathed by patient: Right arm, Left arm, Chest, Abdomen, Front perineal area, Right upper leg, Left upper leg, Right lower leg, Left lower leg, Buttocks Body parts bathed by helper: Back  Bathing assist Assist Level: Touching or steadying assistance(Pt > 75%), 2 helpers      Upper Body Dressing/Undressing Upper body dressing   What is the patient wearing?: Pull over shirt/dress  Pull over shirt/dress - Perfomed by patient: Thread/unthread right sleeve, Thread/unthread left sleeve, Put head through opening, Pull shirt over trunk Pull over shirt/dress - Perfomed by helper: Thread/unthread right sleeve, Thread/unthread left sleeve, Put head through opening, Pull shirt over trunk Button up shirt - Perfomed by patient: Thread/unthread right sleeve, Pull shirt around back Button up shirt - Perfomed by helper: Thread/unthread  left sleeve, Button/unbutton shirt    Upper body assist Assist Level: Supervision or verbal cues      Lower Body Dressing/Undressing Lower body dressing   What is the patient wearing?: Pants, Non-skid slipper socks   Underwear - Performed by helper: Thread/unthread right underwear leg, Thread/unthread left underwear leg, Pull underwear up/down Pants- Performed by patient: Thread/unthread left pants leg, Pull pants up/down Pants- Performed by helper: Thread/unthread right pants leg Non-skid slipper socks- Performed by patient: Don/doff right sock, Don/doff left sock Non-skid slipper socks- Performed by helper: Don/doff right sock, Don/doff left sock   Socks - Performed by helper: Don/doff right sock, Don/doff left sock              Lower body assist Assist for lower body dressing: Touching or steadying assistance (Pt > 75%)      Toileting Toileting Toileting activity did not occur: No continent bowel/bladder event Toileting steps completed by patient: Adjust clothing prior to toileting, Performs perineal hygiene, Adjust clothing after toileting Toileting steps completed by helper: Adjust clothing prior to toileting, Performs perineal hygiene, Adjust clothing after toileting Toileting Assistive Devices: Other (comment)(stedy lift)  Toileting assist Assist level: Touching or steadying assistance (Pt.75%)   Transfers Chair/bed transfer   Chair/bed transfer method: Squat pivot Chair/bed transfer assist level: Moderate assist (Pt 50 - 74%/lift or lower) Chair/bed transfer assistive device: Armrests Mechanical lift: Stedy   Locomotion Ambulation Ambulation activity did not occur: Safety/medical concerns         Wheelchair     Max wheelchair distance: 25 ft Assist Level: Supervision or verbal cues  Cognition Comprehension Comprehension assist level: Follows basic conversation/direction with extra time/assistive device  Expression Expression assist level: Expresses basic 90%  of the time/requires cueing < 10% of the time.  Social Interaction Social Interaction assist level: Interacts appropriately with others with medication or extra time (anti-anxiety, antidepressant).  Problem Solving Problem solving assist level: Solves basic 50 - 74% of the time/requires cueing 25 - 49% of the time  Memory Memory assist level: Recognizes or recalls 75 - 89% of the time/requires cueing 10 - 24% of the time   Medical Problem List and Plan:  1. 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  2. DVT Prophylaxis/Anticoagulation: SCDs.    Dopplers suggesting right femoral and posterior tibial DVT and left peroneal DVT - status post IVC filter placement on 3/9.    -local mgt of swelling has been discussed 3. Pain Management: Oxycodone as needed    -appropriate splinting adaptive techniques for left leg   -Voltaren gel for knee  4. Mood: Seroquel 25 mg twice a day  5. Neuropsych: This patient is capable of making decisions on his own behalf.  6. Skin/Wound Care: Routine skin checks    -Scalp staples removed   7. Fluids/Electrolytes/Nutrition: Routine I&O's    -On regular diet and eating well. 8. End-stage renal disease. Continue hemodialysis as per renal services. Status post arteriovenous Gore-Tex graft left arm 12/15/2017  -HD  scheduled after therapies to maximize therapy time/performance, T, TH, S -chronic foley -volume mgt per nephrology 9. Diabetes mellitus and peripheral neuropathy. Latest hemoglobin A1c 6.4. SSI. Check blood sugars before meals and at bedtime    -continue low-dose Glucotrol QD --improved control at present 10. Acute on chronic anemia. Continue Aranesp.    Hemoglobin 9.0 on 3/9 11. 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 Monitor for any signs of fluid overload   Filed  Weights   12/24/17 0500 12/25/17 0358 12/25/17 1739  Weight: 92 kg (202 lb 13.2 oz) 92.6 kg (204 lb 2.3 oz) 92.2 kg (203 lb 4.2 oz)    Volume mgt ongoing per renal   -cxr with mild congestion   -encouraged IS   -albuterol nebs are available prn for wheezing/sob 12. Hypertension. Monitor with increased mobility    Blood pressure better after hemodialysis 13. MRSA PCR screening positive. Contact precautions  14. Dysphagia:  Advanced to regular texture diet on 3/8   LOS (Days) Gregory EVALUATION WAS PERFORMED      [name], MD 12/26/2017 7:57 AM

## 2017-12-26 NOTE — Progress Notes (Signed)
[facility] Progress Note   Subjective:  No new issues  Wants to only run 3.5h Daughter visited yesterday  Objective Vitals:   12/25/17 1730 12/25/17 1739 12/25/17 1830 12/26/17 0239  BP: (!) 88/53 (!) 114/58 135/70 114/71  Pulse: 88 92 93 64  Resp:  18 18 18   Temp:  98.1 F (36.7 C) 98.1 F (36.7 C) 98 F (36.7 C)  TempSrc:  Oral Oral Oral  SpO2:  98% 99% 97%  Weight:  92.2 kg (203 lb 4.2 oz)     Physical Exam General: WNWD elderly male in bed Heart: RRR Lungs: CTAB Abdomen: soft NT Extremities: LE edema, improving legs wrapped Dialysis Access: LIJ TDC. LUE AVG+bruit   Dialysis Orders:  TTS [facility] 4h 88.5kg 2/2.25 bath P4 LUA AVF Hep 7000 Venofer 50mg  IV qHD Mircera 115mcg IV q2wks - last 2/14 Calcitriol 3.48mcg PO qHD  Sensipar 30mg  qd Renvela 800mg - 2AC TID, 2 w/snacks   HD THS schedule using TDC: 3K, 4h, target 3L UF BP permitting, no heparin   Assessment/Plan: 1. S/p Fall and SDH with craniotomy 2/24, now in CIR  2. ESRD - TTS. Continue on schedule. 3. Vasuclar access - LIJ TDC/LUE AVG placed 3/6, swelling improving 4. Anemia - Aranesp 150 qSat  5. MBD-  VDRA/Renvela  6. HTN/volume - BP controlled/volume stable  7. Nutrition - Renal diet/viatins  8. LE DVT s/p IVC filter 3/9 9. DM2  10. Chronic indwelling foley hx bladder cancer    Pearson Grippe MD 12/26/2017,8:53 AM  LOS: 10 days   Additional Objective Labs: Basic Metabolic Panel: Recent Labs  Lab 12/21/17 1447 12/23/17 1313  NA 131* 131*  K 3.8 4.1  CL 94* 94*  CO2 23 24  GLUCOSE 155* 97  BUN 44* 39*  CREATININE 8.08* 7.15*  CALCIUM 7.6* 8.2*  PHOS 5.4* 4.4   CBC: Recent Labs  Lab 12/21/17 1648 12/23/17 1313  WBC 8.4 6.2  HGB 8.4* 8.4*  HCT 26.4* 26.0*  MCV 93.3 95.2  PLT 202 171   Blood Culture    Component Value Date/Time   SDES URINE, CATHETERIZED 06/26/2017 1652   SPECREQUEST NONE 06/26/2017 1652   CULT >=100,000 COLONIES/mL SERRATIA MARCESCENS  (A) 06/26/2017 1652   REPTSTATUS 06/28/2017 FINAL 06/26/2017 1652    Cardiac Enzymes: No results for input(s): CKTOTAL, CKMB, CKMBINDEX, TROPONINI in the last 168 hours. CBG: Recent Labs  Lab 12/25/17 0648 12/25/17 1141 12/25/17 1828 12/25/17 2057 12/26/17 0702  GLUCAP 84 125* 63* 191* 79   Iron Studies: No results for input(s): IRON, TIBC, TRANSFERRIN, FERRITIN in the last 72 hours. Lab Results  Component Value Date   INR 1.72 12/09/2017   INR 1.69 12/08/2017   INR 1.59 12/07/2017   Medications:  . allopurinol  300 mg Oral Daily  . amiodarone  200 mg Oral Daily  . calcitRIOL  0.5 mcg Oral Q T,Th,Sa-HD  . darbepoetin (ARANESP) injection - DIALYSIS  150 mcg Intravenous Q Sat-HD  . diclofenac sodium  2 g Topical TID  . feeding supplement (PRO-STAT SUGAR FREE 64)  30 mL Oral BID  . glipiZIDE  2.5 mg Oral QAC breakfast  . insulin aspart  0-9 Units Subcutaneous TID WC  . multivitamin  1 tablet Oral QHS  . mupirocin ointment   Nasal BID  . oxybutynin  5 mg Oral BID  . pantoprazole  40 mg Oral QHS  . polyethylene glycol  17 g Oral Daily  . senna-docusate  2 tablet Oral QHS  .  sevelamer carbonate  1,600 mg Oral TID WC  . sodium chloride flush  10-40 mL Intracatheter Q12H

## 2017-12-26 NOTE — Progress Notes (Signed)
Occupational Therapy Session Note  Patient Details  Name: Albert Stanley MRN: [id number] Date of Birth: 05/23/1946  Today's Date: 12/26/2017 OT Individual Time:  - 1200-100  (60 min)      Short Term Goals: Week 1:  OT Short Term Goal 1 (Week 1): Pt will transfer to BSC/ toilet with mod A +1  OT Short Term Goal 1 - Progress (Week 1): Progressing toward goal OT Short Term Goal 2 (Week 1): Pt will perform sit to stands in prep for clothing mangement with mod A  OT Short Term Goal 2 - Progress (Week 1): Progressing toward goal OT Short Term Goal 3 (Week 1): Pt will self feed after setup with supervision  OT Short Term Goal 3 - Progress (Week 1): Met OT Short Term Goal 4 (Week 1): Pt maintain dynamic sitting balance with supervision during functional tasks OT Short Term Goal 4 - Progress (Week 1): Met Week 2:  OT Short Term Goal 1 (Week 2): Pt will transfer to BSC/ toilet with mod A +1  OT Short Term Goal 2 (Week 2): Pt will perform sit to stands in prep for clothing mangement with mod A  OT Short Term Goal 3 (Week 2): Pt will don B socks with supervision from seated level OT Short Term Goal 4 (Week 2): Pt will perform functional task in standing using RW for 1 minute  Week 3:     Skilled Therapeutic Interventions/Progress Updates:    Pt sitting in wc upon OT arrival.  Focus of treatment was  sitting balance, standing balance, , therapeutic activities, postural control, ADL retraining.  Pt stood for 9 minutes in SARA PLUS while performing anterior peri care.  He was dependent with posterior care.  He washed rest of body except for back and lower legs.  Pt improved with balance by end of session and was able to balance without using the waist support for 3-5 seconds.   OT assisted left leg for maintaining alignment and keeping weight through leg.  He was very social and able to direct his daily routine.       Therapy Documentation Precautions:  Precautions Precautions: Fall Precaution  Comments: weak left knee (had problems with it before) Restrictions Weight Bearing Restrictions: No       Pain: Pain Assessment Pain Assessment: 0-10 Pain Score: 4 Pain Type: Acute pain Pain Location: Left leg and knee Pain Intervention(s): Medication (See eMAR) ADL: ADL ADL Comments: see functional navigator    See Function Navigator for Current Functional Status.   Therapy/Group: Individual Therapy  ,  J 12/26/2017, 12:30 PM  

## 2017-12-27 ENCOUNTER — Inpatient Hospital Stay (HOSPITAL_COMMUNITY): Payer: Medicare Other | Admitting: Occupational Therapy

## 2017-12-27 ENCOUNTER — Encounter: Payer: Medicare Other | Admitting: Surgery

## 2017-12-27 ENCOUNTER — Inpatient Hospital Stay (HOSPITAL_COMMUNITY): Payer: Medicare Other

## 2017-12-27 ENCOUNTER — Inpatient Hospital Stay (HOSPITAL_COMMUNITY): Payer: Medicare Other | Admitting: Speech Pathology

## 2017-12-27 LAB — GLUCOSE, CAPILLARY
GLUCOSE-CAPILLARY: 65 mg/dL (ref 65–99)
GLUCOSE-CAPILLARY: 150 mg/dL — AB (ref 65–99)
Glucose-Capillary: 115 mg/dL — ABNORMAL HIGH (ref 65–99)
Glucose-Capillary: 202 mg/dL — ABNORMAL HIGH (ref 65–99)
Glucose-Capillary: 84 mg/dL (ref 65–99)

## 2017-12-27 NOTE — Progress Notes (Signed)
[place] PHYSICAL MEDICINE & REHABILITATION     PROGRESS NOTE    Subjective/Complaints: Patient appreciative of replacement mattress/bed.  Slept much better last night.  ROS: Patient denies fever, rash, sore throat, blurred vision, nausea, vomiting, diarrhea, cough, shortness of breath or chest pain,   back pain, headache, or mood change.    Objective: Vital Signs: Blood pressure 125/75, pulse 81, temperature 98.3 F (36.8 C), temperature source Oral, resp. rate 18, weight 92.6 kg (204 lb 2.3 oz), SpO2 99 %. No results found. No results for input(s): WBC, HGB, HCT, PLT in the last 72 hours. No results for input(s): NA, K, CL, GLUCOSE, BUN, CREATININE, CALCIUM in the last 72 hours.  Invalid input(s): CO CBG (last 3)  Recent Labs    12/26/17 1656 12/26/17 2123 12/27/17 0632  GLUCAP 87 119* 115*    Wt Readings from Last 3 Encounters:  12/27/17 92.6 kg (204 lb 2.3 oz)  12/16/17 91.5 kg (201 lb 11.5 oz)  12/04/17 91.3 kg (201 lb 4.5 oz)    Physical Exam:  Constitutional: No distress . Vital signs reviewed. HEENT: EOMI, oral membranes moist Cardiovascular: RRR without murmur. No JVD    Respiratory: CTA Bilaterally without wheezes or rales. Normal effort    GI: BS +, non-tender, non-distended   Ext: tr to 1+ edema RLE, 2+ LLE. 1+ LUE--- about the same  Musc: left hip and knee tenderness, left genu valgus deformity Neurological: He is alert and oriented.   . Fair insight and awareness.    Motor: RUE: 5/5 proximal to distal LUE: 3/5 proximal to 4-/5 distal RLE: 3/5 HF, KE, 4-/5 ADF LLE: HF 2-/5, KE 2+/5, 3- ADF--no changes Psych: pleasant and cooperative. Skin-wounds all healing without drainage  Assessment/Plan: 1. Functional deficits and left HP secondary to right SDH which require 3+ hours per day of interdisciplinary therapy in a comprehensive inpatient rehab setting. Physiatrist is providing close team supervision and 24 hour management of active medical problems  listed below. Physiatrist and rehab team continue to assess barriers to discharge/monitor patient progress toward functional and medical goals.  Function:  Bathing Bathing position   Position: Wheelchair/chair at sink  Bathing parts Body parts bathed by patient: Right arm, Left arm, Chest, Abdomen, Front perineal area, Right upper leg, Left upper leg, Right lower leg, Left lower leg Body parts bathed by helper: Back  Bathing assist Assist Level: Touching or steadying assistance(Pt > 75%)      Upper Body Dressing/Undressing Upper body dressing   What is the patient wearing?: Pull over shirt/dress     Pull over shirt/dress - Perfomed by patient: Thread/unthread right sleeve, Thread/unthread left sleeve, Put head through opening Pull over shirt/dress - Perfomed by helper: Pull shirt over trunk Button up shirt - Perfomed by patient: Thread/unthread right sleeve, Pull shirt around back Button up shirt - Perfomed by helper: Thread/unthread left sleeve, Button/unbutton shirt    Upper body assist Assist Level: Touching or steadying assistance(Pt > 75%)      Lower Body Dressing/Undressing Lower body dressing   What is the patient wearing?: [place], Non-skid slipper socks   Underwear - Performed by helper: Thread/unthread right underwear leg, Thread/unthread left underwear leg, Pull underwear up/down Pants- Performed by patient: Thread/unthread left pants leg, Pull pants up/down Pants- Performed by helper: Thread/unthread right pants leg, Thread/unthread left pants leg, Pull pants up/down Non-skid slipper socks- Performed by patient: Don/doff right sock, Don/doff left sock Non-skid slipper socks- Performed by helper: Don/doff left sock  Socks - Performed by helper: Don/doff right sock, Don/doff left sock              Lower body assist Assist for lower body dressing: Touching or steadying assistance (Pt > 75%)      Toileting Toileting Toileting activity did not  occur: No continent bowel/bladder event Toileting steps completed by patient: Adjust clothing prior to toileting, Performs perineal hygiene, Adjust clothing after toileting Toileting steps completed by helper: Adjust clothing prior to toileting, Performs perineal hygiene, Adjust clothing after toileting Toileting Assistive Devices: Other (comment)(stedy lift)  Toileting assist Assist level: Touching or steadying assistance (Pt.75%)   Transfers Chair/bed transfer   Chair/bed transfer method: Squat pivot Chair/bed transfer assist level: Moderate assist (Pt 50 - 74%/lift or lower) Chair/bed transfer assistive device: Armrests Mechanical lift: Stedy   Locomotion Ambulation Ambulation activity did not occur: Safety/medical concerns         Wheelchair     Max wheelchair distance: 25 ft Assist Level: Supervision or verbal cues  Cognition Comprehension Comprehension assist level: Follows basic conversation/direction with extra time/assistive device  Expression Expression assist level: Expresses basic 90% of the time/requires cueing < 10% of the time.  Social Interaction Social Interaction assist level: Interacts appropriately with others with medication or extra time (anti-anxiety, antidepressant).  Problem Solving Problem solving assist level: Solves basic 50 - 74% of the time/requires cueing 25 - 49% of the time  Memory Memory assist level: Recognizes or recalls 75 - 89% of the time/requires cueing 10 - 24% of the time   Medical Problem List and Plan:  1. 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  2. DVT Prophylaxis/Anticoagulation: SCDs.    Dopplers suggesting right femoral and posterior tibial DVT and left peroneal DVT - status post IVC filter placement on 3/9.    -local mgt of swelling has been discussed 3. Pain  Management: Oxycodone as needed    -appropriate splinting adaptive techniques for left leg   -Voltaren gel for knee ordered 4. Mood: Seroquel 25 mg twice a day  5. Neuropsych: This patient is capable of making decisions on his own behalf.  6. Skin/Wound Care: Routine skin checks    -Scalp staples removed   7. Fluids/Electrolytes/Nutrition: Routine I&O's    -On regular diet and eating well. 8. End-stage renal disease. Continue hemodialysis as per renal services. Status post arteriovenous Gore-Tex graft left arm 12/15/2017  -HD scheduled after therapies to maximize therapy time/performance, T, TH, S -chronic foley -volume mgt per nephrology 9. Diabetes mellitus and peripheral neuropathy. Latest hemoglobin A1c 6.4. SSI. Check blood sugars before meals and at bedtime    -continue low-dose Glucotrol QD --reasonable control at present 10. Acute on chronic anemia. Continue Aranesp.    Hemoglobin 9.0 on 3/9 11. 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 Monitor for any signs of fluid overload   Filed Weights   12/25/17 0358 12/25/17 1739 12/27/17 0544  Weight: 92.6 kg (204 lb 2.3 oz) 92.2 kg (203 lb 4.2 oz) 92.6 kg (204 lb 2.3 oz)    Volume mgt ongoing per renal   -cxr with mild congestion   -encouraged IS   -albuterol nebs are available prn for wheezing/sob 12. Hypertension. Monitor with increased mobility    Blood pressure controlled 3/18 13. MRSA PCR screening positive. Contact precautions  14. Dysphagia:  Advanced to regular texture diet on 3/8  LOS (Days) [place] EVALUATION WAS PERFORMED      [name], MD 12/27/2017 8:59 AM

## 2017-12-27 NOTE — Progress Notes (Signed)
Speech Language Pathology Daily Session Note  Patient Details  Name: Albert Stanley MRN: [id number] Date of Birth: May 12, 1946  Today's Date: 12/27/2017 SLP Individual Time: 1400-1500 SLP Individual Time Calculation (min): 60 min  Short Term Goals: Week 2: SLP Short Term Goal 1 (Week 2): Pt will selectively attend to tasks in a moderately distracting environment for 30 minutes with supervision cues for redirection to task.   SLP Short Term Goal 2 (Week 2): Pt will complete semi-complex tasks with Min A verbal cues for functional problem solving.   SLP Short Term Goal 3 (Week 2): Pt will recall semi-complex daily information with Supervision verbal cues for use of external aids.   SLP Short Term Goal 4 (Week 2): Pt will recognize and correct errors in the moment during structured tasks with Min A verbal cues.    Skilled Therapeutic Interventions: Skilled treatment session focused on cognitive goals. SLP facilitated session by providing Mod A verbal cues for problem solving during a mildly complex math task and calendar task. Pt required Min A verbal cues to self-monitor and correct errors throughout the task. Pt demonstrated selective attention in a mildly distracting environment for ~30 minutes with Supervision verbal cues for redirection. Pt left upright in wheelchair with alarm on, quick release belt in place, and all needs within reach. Continue with current plan of care.      Function:  Cognition Comprehension Comprehension assist level: Follows basic conversation/direction with extra time/assistive device  Expression   Expression assist level: Expresses basic needs/ideas: With extra time/assistive device  Social Interaction Social Interaction assist level: Interacts appropriately 90% of the time - Needs monitoring or encouragement for participation or interaction.  Problem Solving Problem solving assist level: Solves basic 75 - 89% of the time/requires cueing 10 - 24% of the time   Memory Memory assist level: Recognizes or recalls 75 - 89% of the time/requires cueing 10 - 24% of the time    Pain Pain Assessment Pain Assessment: No/denies pain   Therapy/Group: Individual Therapy  Meredeth Ide  SLP - Student 12/27/2017, 3:31 PM

## 2017-12-27 NOTE — Progress Notes (Signed)
Physical Therapy Session Note  Patient Details  Name: Albert Stanley MRN: [id number] Date of Birth: January 12, 1946  Today's Date: 12/27/2017 PT Individual Time: 1300-1400 PT Individual Time Calculation (min): 60 min   Short Term Goals: Week 2:  PT Short Term Goal 1 (Week 2): Pt will initiate gait training for L NMR & strengthening. PT Short Term Goal 2 (Week 2): Pt will complete bed<>w/c with min assist +1.  Skilled Therapeutic Interventions/Progress Updates:    Utilized stedy for sit <> stands from EOB with mod assist to pull up pants and transfer into w/c. Pt able to maintain standing position with supervision throughout. W/c mobility training with BUE with supervision to min assist needed due to decreased coordination and strength on L causing pt to veer into wall on L and unable to correct with only verbal cues. Sit <> stands, pre-gait, and progressing to gait in parallel bars to progress overall mobility requiring blocking of L knee and cues for hand placement and posture. Sit <> stands overall mod assist with facilitation for anterior weightshift and max cues for foot and hand placement. Pt able to gait x 10' with min to mod assist for facilitation of weightshift and blocking of L knee including a turn at the end to sit down into w/c. Pt reports increased L knee pain but able to continue sit <> stand practice without issue as described above focusing on technique (used yoga block between knees to attempt to improve alignment of LLE). BUE strengthening exercises with 4# straight weight for chest press, overhead press, and bicep curls x 10 reps each x 2 sets. Handoff to SLP  Therapy Documentation Precautions:  Precautions Precautions: Fall Precaution Comments: weak left knee (had problems with it before) Restrictions Weight Bearing Restrictions: No  Pain:  Reports premedicated for pain in LLE. Able to work through and tolerate better this session than in past with weightbearing.   See  Function Navigator for Current Functional Status.   Therapy/Group: Individual Therapy  [name], PT, DPT  12/27/2017, 2:53 PM

## 2017-12-27 NOTE — Progress Notes (Signed)
[facility] Progress Note   Subjective:  No new issues    Objective Vitals:   12/25/17 1830 12/26/17 0239 12/26/17 1543 12/27/17 0544  BP: 135/70 114/71 101/60 125/75  Pulse: 93 64 84 81  Resp: 18 18 18 18   Temp: 98.1 F (36.7 C) 98 F (36.7 C) 98.8 F (37.1 C) 98.3 F (36.8 C)  TempSrc: Oral Oral Oral Oral  SpO2: 99% 97% 100% 99%  Weight:    92.6 kg (204 lb 2.3 oz)   Physical Exam General: WNWD elderly male in bed Heart: RRR Lungs: CTAB Abdomen: soft NT Extremities: LE edema, improving legs wrapped Dialysis Access: LIJ TDC. LUE AVG+bruit   Dialysis Orders:  TTS [facility] 4h 88.5kg  Venofer 50mg  IV qHD Mircera 141mcg IV q2wks - last 2/14 Calcitriol 3.35mcg PO qHD  Sensipar 30mg  qd Renvela 800mg - 2AC TID, 2 w/snacks   HD THS schedule using TDC: 3K, 4h, target 3L UF BP permitting, no heparin (SDH)   Assessment/Plan: 1. S/p Fall and SDH with craniotomy 2/24, now in CIR  2. ESRD - TTS. Continue on schedule. No heparin 3. Vasuclar access - LIJ TDC/LUE AVG placed 3/6, swelling improving 4. Anemia - Aranesp 150 qSat  5. MBD-  VDRA/Renvela  6. HTN/volume - BP controlled/volume stable  7. Nutrition - Renal diet/viatins  8. LE DVT s/p IVC filter 3/9 9. DM2  10. Chronic indwelling foley hx bladder cancer    Kelly Splinter MD [facility] pgr [phone]   12/27/2017, 12:19 PM   Additional Objective Labs: Basic Metabolic Panel: Recent Labs  Lab 12/21/17 1447 12/23/17 1313  NA 131* 131*  K 3.8 4.1  CL 94* 94*  CO2 23 24  GLUCOSE 155* 97  BUN 44* 39*  CREATININE 8.08* 7.15*  CALCIUM 7.6* 8.2*  PHOS 5.4* 4.4   CBC: Recent Labs  Lab 12/21/17 1648 12/23/17 1313  WBC 8.4 6.2  HGB 8.4* 8.4*  HCT 26.4* 26.0*  MCV 93.3 95.2  PLT 202 171   Blood Culture    Component Value Date/Time   SDES URINE, CATHETERIZED 06/26/2017 1652   SPECREQUEST NONE 06/26/2017 1652   CULT >=100,000 COLONIES/mL SERRATIA MARCESCENS (A) 06/26/2017  1652   REPTSTATUS 06/28/2017 FINAL 06/26/2017 1652    Cardiac Enzymes: No results for input(s): CKTOTAL, CKMB, CKMBINDEX, TROPONINI in the last 168 hours. CBG: Recent Labs  Lab 12/26/17 1134 12/26/17 1656 12/26/17 2123 12/27/17 0632 12/27/17 1151  GLUCAP 154* 87 119* 115* 202*   Iron Studies: No results for input(s): IRON, TIBC, TRANSFERRIN, FERRITIN in the last 72 hours. Lab Results  Component Value Date   INR 1.72 12/09/2017   INR 1.69 12/08/2017   INR 1.59 12/07/2017   Medications:  . allopurinol  300 mg Oral Daily  . amiodarone  200 mg Oral Daily  . calcitRIOL  0.5 mcg Oral Q T,Th,Sa-HD  . darbepoetin (ARANESP) injection - DIALYSIS  150 mcg Intravenous Q Sat-HD  . diclofenac sodium  2 g Topical TID  . feeding supplement (PRO-STAT SUGAR FREE 64)  30 mL Oral BID  . glipiZIDE  2.5 mg Oral QAC breakfast  . insulin aspart  0-9 Units Subcutaneous TID WC  . multivitamin  1 tablet Oral QHS  . mupirocin ointment   Nasal BID  . oxybutynin  5 mg Oral BID  . pantoprazole  40 mg Oral QHS  . polyethylene glycol  17 g Oral Daily  . senna-docusate  2 tablet Oral QHS  . sevelamer carbonate  1,600  mg Oral TID WC  . sodium chloride flush  10-40 mL Intracatheter Q12H

## 2017-12-27 NOTE — Progress Notes (Signed)
Occupational Therapy Session Note  Patient Details  Name: Albert Stanley MRN: [id number] Date of Birth: 05/13/1946  Today's Date: 12/27/2017 OT Individual Time: 6979-4801 OT Individual Time Calculation (min): 70 min   Short Term Goals: Week 2:  OT Short Term Goal 1 (Week 2): Pt will transfer to BSC/ toilet with mod A +1  OT Short Term Goal 2 (Week 2): Pt will perform sit to stands in prep for clothing mangement with mod A  OT Short Term Goal 3 (Week 2): Pt will don B socks with supervision from seated level OT Short Term Goal 4 (Week 2): Pt will perform functional task in standing using RW for 1 minute      Skilled Therapeutic Interventions/Progress Updates:    Pt greeted EOB after breakfast. Declining B/D, but requesting to complete grooming tasks at sink. OT applied diabetic lotion to feet, donned Teds, and gripper socks while educating pt importance of diabetic foot care during ADL routine at d/c. Mod-Max A squat pivot<w/c with cues for hand placement. With encouragement, pt agreeable to transfer into [facility] for improving standing tolerance. Sit<stand in [place] from low w/c completed with Max A and pillow placed between legs to improve LE alignment. He engaged in oral care, shaving, and various grooming tasks, standing for >45 minutes. We did need to place a pillow under buttocks due to hip discomfort when sitting on hard plastic paddles. Pt requiring encouragement to meet FM demands of stated tasks, such as opening plastic packages and shaving cream cap. Also discussed with pt and RN staff using Stedy vs Sara+ for toilet transfers at this time due to improvement in pts functional transfer abilities. Safety plan updated and pt and RN staff verbalized understanding. At end of session pt was left in w/c with safety belt fastened and LEs elevated on leg rests. Reminded him to continue LE exercises at rest for edema mgt.    Therapy Documentation Precautions:  Precautions Precautions:  Fall Precaution Comments: weak left knee (had problems with it before) Restrictions Weight Bearing Restrictions: No Pain: Pain Assessment Pain Assessment: 0-10 Pain Score: 4  Pain Type: Acute pain Pain Location: Generalized Pain Descriptors / Indicators: Aching Pain Onset: On-going Patients Stated Pain Goal: 1 Pain Intervention(s): Repositioned ADL: ADL ADL Comments: see functional navigator     See Function Navigator for Current Functional Status.   Therapy/Group: Individual Therapy  Janica Eldred A Tinika Bucknam 12/27/2017, 12:00 PM

## 2017-12-28 ENCOUNTER — Inpatient Hospital Stay (HOSPITAL_COMMUNITY): Payer: Medicare Other | Admitting: Physical Therapy

## 2017-12-28 ENCOUNTER — Inpatient Hospital Stay (HOSPITAL_COMMUNITY): Payer: Medicare Other | Admitting: Speech Pathology

## 2017-12-28 ENCOUNTER — Inpatient Hospital Stay (HOSPITAL_COMMUNITY): Payer: Medicare Other | Admitting: Occupational Therapy

## 2017-12-28 LAB — RENAL FUNCTION PANEL
ANION GAP: 14 (ref 5–15)
Albumin: 2.6 g/dL — ABNORMAL LOW (ref 3.5–5.0)
BUN: 53 mg/dL — AB (ref 6–20)
CHLORIDE: 92 mmol/L — AB (ref 101–111)
CO2: 25 mmol/L (ref 22–32)
Calcium: 7.8 mg/dL — ABNORMAL LOW (ref 8.9–10.3)
Creatinine, Ser: 7.52 mg/dL — ABNORMAL HIGH (ref 0.61–1.24)
GFR calc Af Amer: 7 mL/min — ABNORMAL LOW (ref >60)
GFR calc non Af Amer: 6 mL/min — ABNORMAL LOW (ref >60)
GLUCOSE: 167 mg/dL — AB (ref 65–99)
Phosphorus: 4.3 mg/dL (ref 2.5–4.6)
Potassium: 4.8 mmol/L (ref 3.5–5.1)
Sodium: 131 mmol/L — ABNORMAL LOW (ref 135–145)

## 2017-12-28 LAB — CBC
HCT: 26.2 % — ABNORMAL LOW (ref 39.0–52.0)
HEMOGLOBIN: 8.3 g/dL — AB (ref 13.0–17.0)
MCH: 30.3 pg (ref 26.0–34.0)
MCHC: 31.7 g/dL (ref 30.0–36.0)
MCV: 95.6 fL (ref 78.0–100.0)
Platelets: 177 10*3/uL (ref 150–400)
RBC: 2.74 MIL/uL — ABNORMAL LOW (ref 4.22–5.81)
RDW: 18.4 % — AB (ref 11.5–15.5)
WBC: 5.5 10*3/uL (ref 4.0–10.5)

## 2017-12-28 LAB — GLUCOSE, CAPILLARY
GLUCOSE-CAPILLARY: 144 mg/dL — AB (ref 65–99)
Glucose-Capillary: 110 mg/dL — ABNORMAL HIGH (ref 65–99)
Glucose-Capillary: 116 mg/dL — ABNORMAL HIGH (ref 65–99)
Glucose-Capillary: 164 mg/dL — ABNORMAL HIGH (ref 65–99)

## 2017-12-28 MED ORDER — BISACODYL 5 MG PO TBEC
5.0000 mg | DELAYED_RELEASE_TABLET | Freq: Every day | ORAL | Status: DC
  Administered 2017-12-28 – 2018-01-04 (×7): 5 mg via ORAL
  Filled 2017-12-28 (×7): qty 1

## 2017-12-28 NOTE — Progress Notes (Signed)
Nutrition Follow-up  DOCUMENTATION CODES:   Not applicable  INTERVENTION:  Continue 30 ml Prostat po BID, each supplement provides 100 kcal and 15 grams of protein.   Encourage adequate PO intake.   NUTRITION DIAGNOSIS:   Increased nutrient needs related to chronic illness(ESRD on HD) as evidenced by estimated needs; ongoing  GOAL:   Patient will meet greater than or equal to 90% of their needs; met  MONITOR:   PO intake, Supplement acceptance, Weight trends, Labs, Skin, I & O's  REASON FOR ASSESSMENT:   Consult Diet education  ASSESSMENT:   72 y.o. right handed male with history of bladder cancer with resection in 1999 lymphedema, end-stage renal disease with hemodialysis, diastolic congestive heart failure, diabetes mellitus, hypertension, nonischemic cardiomyopathy.  Presented 12/05/2017 with fatigue, vomiting and diarrhea 2 days as well as headache. Cranial CT reviewed showing right SDH. Neurosurgery consulted underwent right frontotemporal parietal craniotomy hematoma evacuation 12/05/2017.   Meal completion has been 100%.  Pt currently has Prostat ordered and has been consuming them. RD to continue with current orders to aid in caloric and protein needs. Pt encouraged to eat his food at meals and to take his supplements.   Labs and medications reviewed.   Diet Order:  Diet renal/carb modified with fluid restriction Diet-HS Snack? Nothing; Fluid restriction: 1200 mL Fluid; Room service appropriate? Yes; Fluid consistency: Thin  EDUCATION NEEDS:   Education needs have been addressed  Skin:  Skin Assessment: Reviewed RN Assessment Skin Integrity Issues:: Incisions Incisions: head, L arm  Last BM:  3/16  Height:   Ht Readings from Last 1 Encounters:  12/05/17 '5\' 9"'  (1.753 m)    Weight:   Wt Readings from Last 1 Encounters:  12/28/17 212 lb 4.9 oz (96.3 kg)    Ideal Body Weight:  72.7 kg  BMI:  Body mass index is 31.35 kg/m.  Estimated Nutritional  Needs:   Kcal:  2100-2300  Protein:  110-120 grams  Fluid:  1.2 L/day    Corrin Parker, MS, RD, LDN Pager # [phone] After hours/ weekend pager # [phone]

## 2017-12-28 NOTE — Progress Notes (Signed)
Physical Therapy Session Note  Patient Details  Name: Albert Stanley MRN: [id number] Date of Birth: 1946/09/17  Today's Date: 12/28/2017 PT Individual Time: 1015-1055 PT Individual Time Calculation (min): 40 min   Short Term Goals: Week 2:  PT Short Term Goal 1 (Week 2): Pt will initiate gait training for L NMR & strengthening. PT Short Term Goal 2 (Week 2): Pt will complete bed<>w/c with min assist +1.  Skilled Therapeutic Interventions/Progress Updates:   Pt in w/c and agreeable to therapy, denies pain at rest but grimaces w/ active LLE movement. Focused on w/c mobility this session. Pt self-propelled w/c to/from gym in multiple 40-50' bouts w/ supervision. Verbal cues for technique and initiation of task, however did not need initiation cues rest of session. Manual assist needed at turns, otherwise supervision when going straight. Pt c/o slick wheels preventing him from propelling easier. Added theraband grip to wheels to allow better grip. Increased speed after grip added, pt reports much easier to push, however still moves at a relatively slow pace. Provided manual assist to adjust LLE for comfort and placed pillow between knees as pt has significant L knee valgus when resting in w/c. Returned to room and ended session in w/c, call bell within reach and all needs met. QRB donned and chair alarm activated.   Therapy Documentation Precautions:  Precautions Precautions: Fall Precaution Comments: weak left knee (had problems with it before) Restrictions Weight Bearing Restrictions: No Vital Signs: Therapy Vitals Temp: 98.2 F (36.8 C) Temp Source: Oral Pulse Rate: 84 Resp: 20 BP: 114/68 Pain: Pain Assessment Pain Assessment: No/denies pain Pain Score: 0-No pain Faces Pain Scale: Hurts a little bit Pain Type: Acute pain Pain Location: Arm Pain Orientation: Left Pain Descriptors / Indicators: Aching Pain Onset: On-going  See Function Navigator for Current Functional  Status.   Therapy/Group: Individual Therapy  Burtis Imhoff K Arnette 12/28/2017, 3:13 PM

## 2017-12-28 NOTE — Procedures (Signed)
   I was present at this dialysis session, have reviewed the session itself and made  appropriate changes Kelly Splinter MD [place] pager [phone]   12/28/2017, 3:55 PM

## 2017-12-28 NOTE — Progress Notes (Signed)
Occupational Therapy Session Note  Patient Details  Name: Albert Stanley MRN: [id number] Date of Birth: [birth year]  Today's Date: 12/28/2017 OT Individual Time: 9675-9163 OT Individual Time Calculation (min): 75 min    Short Term Goals: Week 2:  OT Short Term Goal 1 (Week 2): Pt will transfer to BSC/ toilet with mod A +1  OT Short Term Goal 2 (Week 2): Pt will perform sit to stands in prep for clothing mangement with mod A  OT Short Term Goal 3 (Week 2): Pt will don B socks with supervision from seated level OT Short Term Goal 4 (Week 2): Pt will perform functional task in standing using RW for 1 minute   Skilled Therapeutic Interventions/Progress Updates:    Pt received in room supine in bed. Pt seen for morning ADL routine with a focus on functional ADL transfers, self-care retraining, weight shifting/upright posture, and overall activity tolerance.  Vc and tactile cues required for sequencing supine to EOB sitting transfer. Pt completed UB bathing and dressing with set up. Pt used stedy lift to facilitate upright posture for standing level peri-care. Mod A required for initiating sit to stand transfer in stedy, as well as tactile and verbal cues to self-correct upright standing posture. Vc and demonstration provided re use of figure 4 technique to don underwear and socks seated EOB, with pt requiring min A to lift L LE to knee. Pt was able to remove both hands from stedy to pull up underwear in standing and maintaining balance. Frequent verbal and tactile cues provided for weight shifting over L LE and self-correction of foot placement. Heavy vc for technique and sequencing required during squat pivot transfer from EOB to w/c. Pt then seen in therapy gym to practice sit to stand transfers from w/c with RW. Education provided re r/w management and UE placement during initiation of transfers. Pt completed 3x sit to stand transfers with min A posteriorly. Pt cued to use mirror anteriorly to  self-correct L LE knee deviation and LE placement. Pt returned to room and left with chair alarm activated, quick release belt, and all needs met.   Therapy Documentation Precautions:  Precautions Precautions: Fall Precaution Comments: weak left knee (had problems with it before) Restrictions Weight Bearing Restrictions: No Pain: Pain Assessment Pain Assessment: 0-10 Pain Score: 4  Faces Pain Scale: Hurts a little bit Pain Type: Acute pain Pain Location: Arm Pain Orientation: Left Pain Descriptors / Indicators: Aching Pain Frequency: Intermittent Pain Onset: On-going Patients Stated Pain Goal: 2 Pain Intervention(s): Repositioned ADL: ADL ADL Comments: see functional navigator  See Function Navigator for Current Functional Status.   Therapy/Group: Individual Therapy  Curtis Sites 12/28/2017, 12:46 PM

## 2017-12-28 NOTE — Progress Notes (Signed)
Speech Language Pathology Daily Session Note  Patient Details  Name: Albert Stanley MRN: [id number] Date of Birth: 10/04/1946  Today's Date: 12/28/2017 SLP Individual Time: 1115-1200 SLP Individual Time Calculation (min): 45 min  Short Term Goals: Week 2: SLP Short Term Goal 1 (Week 2): Pt will selectively attend to tasks in a moderately distracting environment for 30 minutes with supervision cues for redirection to task.   SLP Short Term Goal 2 (Week 2): Pt will complete semi-complex tasks with Min A verbal cues for functional problem solving.   SLP Short Term Goal 3 (Week 2): Pt will recall semi-complex daily information with Supervision verbal cues for use of external aids.   SLP Short Term Goal 4 (Week 2): Pt will recognize and correct errors in the moment during structured tasks with Min A verbal cues.    Skilled Therapeutic Interventions: Skilled treatment session focused on cognitive goals. SLP facilitated session by providing Min A verbal cues for problem solving and Supervision verbal cues for recall during a novel, mildly complex visual memory task. Pt required Mod A verbal cues to self-monitor and correct errors during task. Pt demonstrated selective attention in a moderately distracting environment for ~30 minutes with Supervision verbal cues for redirection. Pt left upright in wheelchair with alarm on, quick release belt in place, and daughter present. Continue with current plan of care.      Function:  Cognition Comprehension Comprehension assist level: Follows basic conversation/direction with extra time/assistive device  Expression   Expression assist level: Expresses basic needs/ideas: With extra time/assistive device  Social Interaction Social Interaction assist level: Interacts appropriately 90% of the time - Needs monitoring or encouragement for participation or interaction.  Problem Solving Problem solving assist level: Solves basic 90% of the time/requires cueing <  10% of the time  Memory Memory assist level: Recognizes or recalls 90% of the time/requires cueing < 10% of the time    Pain Pain Assessment Pain Assessment: No/denies pain   Therapy/Group: Individual Therapy  Meredeth Ide  SLP - Student 12/28/2017, 2:37 PM

## 2017-12-28 NOTE — Progress Notes (Signed)
[facility] PHYSICAL MEDICINE & REHABILITATION     PROGRESS NOTE    Subjective/Complaints: Up eating breakfast. No new complaints. States that pain is reasonably controlled  ROS: Patient denies fever, rash, sore throat, blurred vision, nausea, vomiting, diarrhea, cough, shortness of breath or chest pain, joint or back pain, headache, or mood change.    Objective: Vital Signs: Blood pressure 121/65, pulse 80, temperature 97.7 F (36.5 C), temperature source Axillary, resp. rate 20, weight 94.7 kg (208 lb 12.4 oz), SpO2 99 %. No results found. No results for input(s): WBC, HGB, HCT, PLT in the last 72 hours. No results for input(s): NA, K, CL, GLUCOSE, BUN, CREATININE, CALCIUM in the last 72 hours.  Invalid input(s): CO CBG (last 3)  Recent Labs    12/27/17 1648 12/27/17 2056 12/28/17 0634  GLUCAP 84 150* 110*    Wt Readings from Last 3 Encounters:  12/28/17 94.7 kg (208 lb 12.4 oz)  12/16/17 91.5 kg (201 lb 11.5 oz)  12/04/17 91.3 kg (201 lb 4.5 oz)    Physical Exam:  Constitutional: No distress . Vital signs reviewed. HEENT: EOMI, oral membranes moist Cardiovascular: RRR without murmur. No JVD    Respiratory: CTA Bilaterally without wheezes or rales. Normal effort    GI: BS +, non-tender, non-distended   Ext: tr to 1+ edema RLE, 2+ LLE. 1+ LUE--- swollen still at graft site  Musc: left hip and knee tenderness, left genu valgus deformity Neurological: He is alert and oriented.   . Fair insight and awareness.    Motor: RUE: 5/5 proximal to distal LUE: 3/5 proximal to 4-/5 distal RLE: 3/5 HF, KE, 4-/5 ADF LLE: HF 2-/5, KE 2+/5, 3- ADF--stable Psych: pleasant and cooperative. Skin-wounds all healing without drainage  Assessment/Plan: 1. Functional deficits and left HP secondary to right SDH which require 3+ hours per day of interdisciplinary therapy in a comprehensive inpatient rehab setting. Physiatrist is providing close team supervision and 24 hour management of  active medical problems listed below. Physiatrist and rehab team continue to assess barriers to discharge/monitor patient progress toward functional and medical goals.  Function:  Bathing Bathing position   Position: Wheelchair/chair at sink  Bathing parts Body parts bathed by patient: Right arm, Left arm, Chest, Abdomen, Front perineal area, Right upper leg, Left upper leg, Right lower leg, Left lower leg Body parts bathed by helper: Back  Bathing assist Assist Level: Touching or steadying assistance(Pt > 75%)      Upper Body Dressing/Undressing Upper body dressing   What is the patient wearing?: Pull over shirt/dress     Pull over shirt/dress - Perfomed by patient: Thread/unthread right sleeve, Thread/unthread left sleeve, Put head through opening Pull over shirt/dress - Perfomed by helper: Pull shirt over trunk Button up shirt - Perfomed by patient: Thread/unthread right sleeve, Pull shirt around back Button up shirt - Perfomed by helper: Thread/unthread left sleeve, Button/unbutton shirt    Upper body assist Assist Level: Touching or steadying assistance(Pt > 75%)      Lower Body Dressing/Undressing Lower body dressing   What is the patient wearing?: [place], Non-skid slipper socks   Underwear - Performed by helper: Thread/unthread right underwear leg, Thread/unthread left underwear leg, Pull underwear up/down Pants- Performed by patient: Thread/unthread left pants leg, Pull pants up/down Pants- Performed by helper: Thread/unthread right pants leg, Thread/unthread left pants leg, Pull pants up/down Non-skid slipper socks- Performed by patient: Don/doff right sock, Don/doff left sock Non-skid slipper socks- Performed by helper: Don/doff left sock  Socks - Performed by helper: Don/doff right sock, Don/doff left sock              Lower body assist Assist for lower body dressing: Touching or steadying assistance (Pt > 75%)      Toileting Toileting  Toileting activity did not occur: No continent bowel/bladder event Toileting steps completed by patient: Adjust clothing prior to toileting, Performs perineal hygiene, Adjust clothing after toileting Toileting steps completed by helper: Adjust clothing prior to toileting, Performs perineal hygiene, Adjust clothing after toileting Toileting Assistive Devices: Grab bar or rail(sara )  Toileting assist Assist level: Touching or steadying assistance (Pt.75%)   Transfers Chair/bed transfer   Chair/bed transfer method: Other Chair/bed transfer assist level: Moderate assist (Pt 50 - 74%/lift or lower) Chair/bed transfer assistive device: Mechanical lift Mechanical lift: Stedy   Locomotion Ambulation Ambulation activity did not occur: Safety/medical concerns   Max distance: 10' Assist level: Moderate assist (Pt 50 - 74%)   Wheelchair   Type: Manual Max wheelchair distance: 8' Assist Level: Touching or steadying assistance (Pt > 75%)  Cognition Comprehension Comprehension assist level: Follows basic conversation/direction with extra time/assistive device  Expression Expression assist level: Expresses basic needs/ideas: With extra time/assistive device  Social Interaction Social Interaction assist level: Interacts appropriately 90% of the time - Needs monitoring or encouragement for participation or interaction.  Problem Solving Problem solving assist level: Solves basic 75 - 89% of the time/requires cueing 10 - 24% of the time  Memory Memory assist level: Recognizes or recalls 75 - 89% of the time/requires cueing 10 - 24% of the time   Medical Problem List and Plan:  1. 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  2. DVT Prophylaxis/Anticoagulation: SCDs.    Dopplers suggesting right femoral and posterior tibial DVT and  left peroneal DVT - status post IVC filter placement on 3/9.    -local edema control, elevation 3. Pain Management: Oxycodone as needed    -appropriate splinting adaptive techniques for left leg   -Voltaren gel for knee ordered 4. Mood: Seroquel 25 mg twice a day  5. Neuropsych: This patient is capable of making decisions on his own behalf.  6. Skin/Wound Care: Routine skin checks    -Scalp staples removed   7. Fluids/Electrolytes/Nutrition: Routine I&O's    -On regular diet and eating well. 8. End-stage renal disease. Continue hemodialysis as per renal services. Status post arteriovenous Gore-Tex graft left arm 12/15/2017  -HD scheduled after therapies to maximize therapy time/performance, T, TH, S -chronic foley -volume mgt per nephrology 9. Diabetes mellitus and peripheral neuropathy. Latest hemoglobin A1c 6.4. SSI. Check blood sugars before meals and at bedtime    -continue low-dose Glucotrol QD --generally has been controlled. Had one elevation yesterday 10. Acute on chronic anemia. Continue Aranesp.    Hemoglobin 8.4 on 3/14 11. 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 Monitor for any signs of fluid overload   Filed Weights   12/25/17 1739 12/27/17 0544 12/28/17 0245  Weight: 92.2 kg (203 lb 4.2 oz) 92.6 kg (204 lb 2.3 oz) 94.7 kg (208 lb 12.4 oz)    Volume mgt ongoing per renal   -cxr with mild congestion   -encouraged IS   -albuterol nebs are available prn for wheezing/sob 12. Hypertension. Monitor with increased mobility    Blood pressure controlled 3/19 13. MRSA PCR screening positive. Contact precautions  14.  Dysphagia:  Advanced to regular texture diet on 3/8   LOS (Days) 12 A FACE TO FACE EVALUATION WAS PERFORMED      [name], MD 12/28/2017 9:27 AM

## 2017-12-29 ENCOUNTER — Inpatient Hospital Stay (HOSPITAL_COMMUNITY): Payer: Medicare Other | Admitting: Physical Therapy

## 2017-12-29 ENCOUNTER — Other Ambulatory Visit: Payer: Self-pay

## 2017-12-29 ENCOUNTER — Inpatient Hospital Stay (HOSPITAL_COMMUNITY): Payer: Medicare Other

## 2017-12-29 ENCOUNTER — Inpatient Hospital Stay (HOSPITAL_COMMUNITY): Payer: Medicare Other | Admitting: Speech Pathology

## 2017-12-29 DIAGNOSIS — S065X1S Traumatic subdural hemorrhage with loss of consciousness of 30 minutes or less, sequela: Secondary | ICD-10-CM

## 2017-12-29 LAB — GLUCOSE, CAPILLARY
GLUCOSE-CAPILLARY: 179 mg/dL — AB (ref 65–99)
GLUCOSE-CAPILLARY: 104 mg/dL — AB (ref 65–99)
GLUCOSE-CAPILLARY: 116 mg/dL — AB (ref 65–99)
GLUCOSE-CAPILLARY: 167 mg/dL — AB (ref 65–99)

## 2017-12-29 NOTE — Progress Notes (Signed)
Speech Language Pathology Daily Session Note  Patient Details  Name: Albert Stanley MRN: [id number] Date of Birth: 19-Dec-1945  Today's Date: 12/29/2017 SLP Individual Time: 0930-1030 SLP Individual Time Calculation (min): 60 min  Short Term Goals: Week 2: SLP Short Term Goal 1 (Week 2): Pt will selectively attend to tasks in a moderately distracting environment for 30 minutes with supervision cues for redirection to task.   SLP Short Term Goal 2 (Week 2): Pt will complete semi-complex tasks with Min A verbal cues for functional problem solving.   SLP Short Term Goal 3 (Week 2): Pt will recall semi-complex daily information with Supervision verbal cues for use of external aids.   SLP Short Term Goal 4 (Week 2): Pt will recognize and correct errors in the moment during structured tasks with Min A verbal cues.    Skilled Therapeutic Interventions: Skilled treatment session focused on cognitive goals. SLP facilitated session by providing Mod A verbal and visual cues for problem solving during a complex organization and sequencing task while navigating specific apps on his phone per his request. SLP also facilitated session by providing Supervision verbal cues for problem solving in regards to reading/utilizing daily schedule to anticipate upcoming sessions. Pt required Mod A verbal cues to recall therapists' names and previous therapy sessions. Pt demonstrated selective attention in a mildly distracting environment for ~30 minutes with Supervision verbal cues for redirection. Pt left upright in wheelchair with alarm on, quick release belt in place, and all needs within reach. Continue with current plan of care.      Function:  Cognition Comprehension Comprehension assist level: Follows basic conversation/direction with extra time/assistive device  Expression   Expression assist level: Expresses basic needs/ideas: With extra time/assistive device  Social Interaction Social Interaction assist  level: Interacts appropriately 90% of the time - Needs monitoring or encouragement for participation or interaction.  Problem Solving Problem solving assist level: Solves basic 75 - 89% of the time/requires cueing 10 - 24% of the time  Memory Memory assist level: Recognizes or recalls 50 - 74% of the time/requires cueing 25 - 49% of the time    Pain Pain Assessment Pain Location: Knee Pain Orientation: Left Pain Intervention(s): RN made aware;Repositioned  Therapy/Group: Individual Therapy  Meredeth Ide  SLP - Student 12/29/2017, 12:06 PM

## 2017-12-29 NOTE — Patient Care Conference (Signed)
Inpatient RehabilitationTeam Conference and Plan of Care Update Date: 12/28/2017   Time: 2:30 PM    Patient Name: Albert Stanley      Medical Record Number: [id number]  Date of Birth: 05/22/1946 Sex: Male         Room/Bed: 4W07C/4W07C-01 Payor Info: Payor: [employer] MEDICARE / Plan: [employer] MEDICARE / Product Type: *No Product type* /    Admitting Diagnosis: TBI  Admit Date/Time:  12/16/2017  5:55 PM Admission Comments: No comment available   Primary Diagnosis:  Traumatic subdural hematoma ([name]) Principal Problem: Traumatic subdural hematoma [facility])  Patient Active Problem List   Diagnosis Date Noted  . Labile blood pressure   . Essential hypertension   . Acute on chronic diastolic heart failure ([place])   . Type 2 diabetes mellitus with peripheral neuropathy ([name])   . Acute deep vein thrombosis (DVT) of femoral vein of right lower extremity ([place])   . Traumatic subdural hematoma ([place]) 12/16/2017  . ESRD (end stage renal disease) ([place])   . ESRD (end stage renal disease) on dialysis ([place])   . Dysphagia   . Acute blood loss anemia   . Anemia of chronic disease   . Chronic diastolic congestive heart failure ([place])   . Diabetes mellitus type 2 in nonobese ([name])   . Benign essential HTN   . Nonischemic cardiomyopathy ([place])   . Transaminitis   . Encounter for central line placement   . Endotracheally intubated   . SDH (subdural hematoma) ([place])   . Ventilator dependent ([place])   . Chronic renal impairment   . Chronic in-center hemodialysis status ([place])   . S/P craniotomy 12/05/2017  . Hyperkalemia 12/02/2017  . Malnutrition of moderate degree 12/18/2016  . PVC's (premature ventricular contractions)   . Anemia associated with chronic renal failure   . End-stage renal disease on hemodialysis ([place]) 12/09/2016  . Dyslipidemia associated with type 2 diabetes mellitus ([place]) 12/09/2016  . Acute on chronic systolic and diastolic heart failure, NYHA class 1 ([place]) 12/09/2016  . 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 Type 2 diabetes mellitus ([place]) 12/11/2015  . Fluid overload 12/06/2015  . History of DVT (deep vein thrombosis) 12/04/2015  . Renal hematoma 12/04/2015  . H/O: gout 11/16/2013  . Trochanteric bursitis 10/17/2013  . 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 Lumbar degenerative disc disease 07/17/2013    Expected Discharge Date: Expected Discharge Date: 01/07/18  Team Members Present: Physician leading conference: Dr. Alger Simons Social Worker Present: Lennart Pall, LCSW Nurse Present: Leonette Nutting, RN PT Present: Roderic Ovens, PT OT Present: Willeen Cass, OT SLP Present: Weston Anna, SLP PPS Coordinator present : Daiva Nakayama, RN, CRRN     Current Status/Progress Goal Weekly Team Focus  Medical   Good p.o. intake.  Wounds are healing.  Ongoing hemodialysis.  Patient with limitations in left lower extremity due to prior orthopedic conditions  Control pain and increase activity tolerance  See prior   Bowel/Bladder   Chronic Foley with care, QS and prn last BM 12/27/17 remains on medical regime per orders  Continent of Bowel/ continue Foley  Assess QS and prn    Swallow/Nutrition/ Hydration   Regular textures with thin liquids Mod I  Mod I  Goals Met    ADL's   Min A bathing with use of Stedy for sit<stand, Min A UB dressing, Max A LB dressing, Mod A functional squat pivot transfers + sit<stand in [place] for toileting  supervision - min A overall, will likely need to be downgraded  General strengthening, balance, sit<stands, functional transfers, activity tolerance    Mobility   mod to max assist transfers (squat pivot or stedy); limited by endurance nad L knee pain but doing better tolerating; able to initiate gait in // bars 3/18 with mod assist  supervision to min assist w/c level; d/c stair goals and max assist gait goal   activity tolerance, sit <> stands, transfers, L NMR, balance, progress  gait as able, d/c planning   Communication             Safety/Cognition/ Behavioral Observations  Min-Mod A  Supervision  problem solving, recall, and awareness    Pain   Pain rating 7-8/10 on pain scale , headache, knee, and generalize pain , Continue prn medical regime as ordered  ( 3  assess and evaluate the effectiveness of medication, QS and prn ,assist with repositioning and other  non and medical interventions, Re-evaluated   Skin              Rehab Goals Patient on target to meet rehab goals: No Rehab Goals Revised: many goals are being downgraded due to limited progress. *See Care Plan and progress notes for long and short-term goals.     Barriers to Discharge  Current Status/Progress Possible Resolutions Date Resolved   Physician    Medical stability               Nursing                  PT  Decreased caregiver support;Home environment access/layout  split level home; need to determine accessibility to floor with bed/bath  home measurement sheet now in pt room           OT                  SLP                SW Decreased caregiver support It does not appear that insurance will cover several hours of HHA services per day as wife had understood - need to discuss SNF or family providing increased care. d/c plan still under discussion.          Discharge Planning/Teaching Needs:  Plan to follow up with wife to confirm amount of services that are covered in home under her insurance.  My thoughts are that his plan may very well change to SNF.  Daughter has observed several therapy sessions.   Team Discussion:  Ortho issues continue to be a barrier;  Swelling in LE some decreased.  Pt is able to bear weight through LE but still would not anticipate for him to be a functional ambulator.  Hoping to advance to to using walker for transfers insead of STEDY.  Expecting min assist w/c level goals.  Making slow gains with cognition this week.  SW to follow up with pt and family  further about possible need for SNF.  Revisions to Treatment Plan:  Some goals revised.    Continued Need for Acute Rehabilitation Level of Care: The patient requires daily medical management by a physician with specialized training in physical medicine and rehabilitation for the following conditions: Daily direction of a multidisciplinary physical rehabilitation program to ensure safe treatment while eliciting the highest outcome that is of practical value to the patient.: Yes Daily medical management of patient stability for increased activity during participation in an intensive rehabilitation regime.: Yes  Daily analysis of laboratory values and/or radiology reports with any subsequent need for medication adjustment of medical intervention for : Neurological problems;Post surgical problems;Wound care problems;Renal problems  Takya Vandivier 12/29/2017, 11:54 AM

## 2017-12-29 NOTE — Progress Notes (Signed)
Social Work Patient ID: Albert Stanley, male   DOB: 10/06/1946, 72 y.o.   MRN: [id number]     Rexene Alberts  Social Worker  General Practice  Patient Care Conference  Signed  Date of Service:  12/29/2017 11:54 AM          Signed          _0 Hide copied text  _1 Hover for details   Inpatient RehabilitationTeam Conference and Plan of Care Update Date: 12/28/2017   Time: 2:30 PM      Patient Name: Albert Stanley      Medical Record Number: [id number]  Date of Birth: 03-20-1946 Sex: Male         Room/Bed: 4W07C/4W07C-01 Payor Info: Payor: [employer] MEDICARE / Plan: UHC MEDICARE / Product Type: *No Product type* /     Admitting Diagnosis: TBI  Admit Date/Time:  12/16/2017  5:55 PM Admission Comments: No comment available    Primary Diagnosis:  Traumatic subdural hematoma ([name]) Principal Problem: Traumatic subdural hematoma [facility])       Patient Active Problem List    Diagnosis Date Noted  . Labile blood pressure    . Essential hypertension    . Acute on chronic diastolic heart failure ([place])    . Type 2 diabetes mellitus with peripheral neuropathy ([name])    . Acute deep vein thrombosis (DVT) of femoral vein of right lower extremity ([place])    . Traumatic subdural hematoma ([place]) 12/16/2017  . ESRD (end stage renal disease) ([place])    . ESRD (end stage renal disease) on dialysis ([place])    . Dysphagia    . Acute blood loss anemia    . Anemia of chronic disease    . Chronic diastolic congestive heart failure ([place])    . Diabetes mellitus type 2 in nonobese ([name])    . Benign essential HTN    . Nonischemic cardiomyopathy ([place])    . Transaminitis    . Encounter for central line placement    . Endotracheally intubated    . SDH (subdural hematoma) ([place])    . Ventilator dependent ([place])    . Chronic renal impairment    . Chronic in-center hemodialysis status ([place])    . S/P craniotomy 12/05/2017  . Hyperkalemia 12/02/2017  . Malnutrition of moderate degree  12/18/2016  . PVC's (premature ventricular contractions)    . Anemia associated with chronic renal failure    . End-stage renal disease on hemodialysis ([place]) 12/09/2016  . Dyslipidemia associated with type 2 diabetes mellitus ([place]) 12/09/2016  . Acute on chronic systolic and diastolic heart failure, NYHA class 1 ([place]) 12/09/2016  . Diabetes mellitus with diabetic nephropathy without long-term current use of insulin ([place]) 12/09/2016  . Type 2 diabetes mellitus ([place]) 12/11/2015  . Fluid overload 12/06/2015  . History of DVT (deep vein thrombosis) 12/04/2015  . Renal hematoma 12/04/2015  . H/O: gout 11/16/2013  . Trochanteric bursitis 10/17/2013  . 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 Lumbar degenerative disc disease 07/17/2013      Expected Discharge Date: Expected Discharge Date: 01/07/18   Team Members Present: Physician leading conference: Dr. Alger Simons Social Worker Present: Lennart Pall, LCSW Nurse Present: Leonette Nutting, RN PT Present: Roderic Ovens, PT OT Present: Willeen Cass, OT SLP Present: Weston Anna, SLP PPS Coordinator present : Daiva Nakayama, RN, CRRN       Current Status/Progress Goal Weekly Team Focus  Medical     Good p.o. intake.  Wounds are healing.  Ongoing hemodialysis.  Patient with limitations in left lower extremity due to prior orthopedic conditions  Control pain and increase activity tolerance  See prior   Bowel/Bladder     Chronic Foley with care, QS and prn last BM 12/27/17 remains on medical regime per orders  Continent of Bowel/ continue Foley  Assess QS and prn    Swallow/Nutrition/ Hydration     Regular textures with thin liquids Mod I  Mod I  Goals Met    ADL's     Min A bathing with use of Stedy for sit<stand, Min A UB dressing, Max A LB dressing, Mod A functional squat pivot transfers + sit<stand in [place] for toileting  supervision - min A overall, will likely need to be downgraded  General strengthening, balance, sit<stands, functional  transfers, activity tolerance    Mobility     mod to max assist transfers (squat pivot or stedy); limited by endurance nad L knee pain but doing better tolerating; able to initiate gait in // bars 3/18 with mod assist  supervision to min assist w/c level; d/c stair goals and max assist gait goal   activity tolerance, sit <> stands, transfers, L NMR, balance, progress gait as able, d/c planning   Communication               Safety/Cognition/ Behavioral Observations   Min-Mod A  Supervision  problem solving, recall, and awareness    Pain     Pain rating 7-8/10 on pain scale , headache, knee, and generalize pain , Continue prn medical regime as ordered  ( 3  assess and evaluate the effectiveness of medication, QS and prn ,assist with repositioning and other  non and medical interventions, Re-evaluated   Skin                 Rehab Goals Patient on target to meet rehab goals: No Rehab Goals Revised: many goals are being downgraded due to limited progress. *See Care Plan and progress notes for long and short-term goals.      Barriers to Discharge   Current Status/Progress Possible Resolutions Date Resolved   Physician     Medical stability              Nursing                 PT  Decreased caregiver support;Home environment access/layout  split level home; need to determine accessibility to floor with bed/bath  home measurement sheet now in pt room           OT                 SLP            SW Decreased caregiver support It does not appear that insurance will cover several hours of HHA services per day as wife had understood - need to discuss SNF or family providing increased care. d/c plan still under discussion.            Discharge Planning/Teaching Needs:  Plan to follow up with wife to confirm amount of services that are covered in home under her insurance.  My thoughts are that his plan may very well change to SNF.  Daughter has observed several therapy sessions.   Team  Discussion:  Ortho issues continue to be a barrier;  Swelling in LE some decreased.  Pt is able to bear weight through LE but still would not anticipate for him to be a functional ambulator.  Hoping to advance to to using walker for transfers insead of STEDY.  Expecting min assist w/c level goals.  Making slow gains with cognition this week.  SW to follow up with pt and family further about possible need for SNF.  Revisions to Treatment Plan:  Some goals revised.    Continued Need for Acute Rehabilitation Level of Care: The patient requires daily medical management by a physician with specialized training in physical medicine and rehabilitation for the following conditions: Daily direction of a multidisciplinary physical rehabilitation program to ensure safe treatment while eliciting the highest outcome that is of practical value to the patient.: Yes Daily medical management of patient stability for increased activity during participation in an intensive rehabilitation regime.: Yes Daily analysis of laboratory values and/or radiology reports with any subsequent need for medication adjustment of medical intervention for : Neurological problems;Post surgical problems;Wound care problems;Renal problems   Lael Pilch 12/29/2017, 11:54 AM

## 2017-12-29 NOTE — Progress Notes (Signed)
[facility] PHYSICAL MEDICINE & REHABILITATION     PROGRESS NOTE    Subjective/Complaints: Slept well. Just awakening when I arrived this morning. No new issues  ROS: Patient denies fever, rash, sore throat, blurred vision, nausea, vomiting, diarrhea, cough, shortness of breath or chest pain, joint or back pain, headache, or mood change.    Objective: Vital Signs: Blood pressure 119/64, pulse 87, temperature 98.7 F (37.1 C), temperature source Oral, resp. rate 17, weight 93.5 kg (206 lb 2.1 oz), SpO2 100 %. No results found. Recent Labs    12/28/17 1358  WBC 5.5  HGB 8.3*  HCT 26.2*  PLT 177   Recent Labs    12/28/17 1358  NA 131*  K 4.8  CL 92*  GLUCOSE 167*  BUN 53*  CREATININE 7.52*  CALCIUM 7.8*   CBG (last 3)  Recent Labs    12/29/17 0646 12/29/17 1145 12/29/17 1705  GLUCAP 179* 104* 116*    Wt Readings from Last 3 Encounters:  12/29/17 93.5 kg (206 lb 2.1 oz)  12/16/17 91.5 kg (201 lb 11.5 oz)  12/04/17 91.3 kg (201 lb 4.5 oz)    Physical Exam:  Constitutional: No distress . Vital signs reviewed. HEENT: EOMI, oral membranes moist Cardiovascular: RRR without murmur. No JVD    Respiratory: CTA Bilaterally without wheezes or rales. Normal effort    GI: BS +, non-tender, non-distended   Ext: tr to 1+ edema RLE, 2+ LLE. 1+ LUE--- swollen still at graft site  Musc: left hip and knee tenderness, left genu valgus deformity Neurological: He is alert and oriented.   . Fair insight and awareness.    Motor: RUE: 5/5 proximal to distal LUE: 3/5 proximal to 4-/5 distal RLE: 3/5 HF, KE, 4-/5 ADF LLE: HF 2-/5, KE 2+/5, 3- ADF--stable Psych: pleasant and cooperative. Skin-wounds all healing without drainage  Assessment/Plan: 1. Functional deficits and left HP secondary to right SDH which require 3+ hours per day of interdisciplinary therapy in a comprehensive inpatient rehab setting. Physiatrist is providing close team supervision and 24 hour management of  active medical problems listed below. Physiatrist and rehab team continue to assess barriers to discharge/monitor patient progress toward functional and medical goals.  Function:  Bathing Bathing position   Position: Wheelchair/chair at sink  Bathing parts Body parts bathed by patient: Right arm, Left arm, Front perineal area, Abdomen, Chest, Buttocks, Right upper leg, Left upper leg, Right lower leg, Left lower leg Body parts bathed by helper: Back  Bathing assist Assist Level: Touching or steadying assistance(Pt > 75%)      Upper Body Dressing/Undressing Upper body dressing   What is the patient wearing?: Pull over shirt/dress     Pull over shirt/dress - Perfomed by patient: Thread/unthread right sleeve, Thread/unthread left sleeve, Put head through opening, Pull shirt over trunk Pull over shirt/dress - Perfomed by helper: Pull shirt over trunk Button up shirt - Perfomed by patient: Thread/unthread right sleeve, Pull shirt around back Button up shirt - Perfomed by helper: Thread/unthread left sleeve, Button/unbutton shirt    Upper body assist Assist Level: Supervision or verbal cues      Lower Body Dressing/Undressing Lower body dressing   What is the patient wearing?: Hospital Gown, Underwear, Non-skid slipper socks Underwear - Performed by patient: Thread/unthread right underwear leg, Thread/unthread left underwear leg Underwear - Performed by helper: Pull underwear up/down(standing with RW) Pants- Performed by patient: Thread/unthread left pants leg, Pull pants up/down Pants- Performed by helper: Thread/unthread right pants leg, Thread/unthread left  pants leg, Pull pants up/down Non-skid slipper socks- Performed by patient: Don/doff right sock, Don/doff left sock Non-skid slipper socks- Performed by helper: Don/doff left sock   Socks - Performed by helper: Don/doff right sock, Don/doff left sock              Lower body assist Assist for lower body dressing: Touching or  steadying assistance (Pt > 75%)      Toileting Toileting Toileting activity did not occur: No continent bowel/bladder event Toileting steps completed by patient: Adjust clothing after toileting Toileting steps completed by helper: Performs perineal hygiene, Adjust clothing prior to toileting Toileting Assistive Devices: Other (comment)(stedy)  Toileting assist Assist level: Touching or steadying assistance (Pt.75%)   Transfers Chair/bed transfer   Chair/bed transfer method: Stand pivot Chair/bed transfer assist level: Maximal assist (Pt 25 - 49%/lift and lower) Chair/bed transfer assistive device: Walker, Armrests Mechanical lift: Stedy   Locomotion Ambulation Ambulation activity did not occur: Safety/medical concerns   Max distance: 10' Assist level: Moderate assist (Pt 50 - 74%)   Wheelchair   Type: Manual Max wheelchair distance: 75' Assist Level: Supervision or verbal cues  Cognition Comprehension Comprehension assist level: Follows basic conversation/direction with extra time/assistive device  Expression Expression assist level: Expresses basic needs/ideas: With extra time/assistive device  Social Interaction Social Interaction assist level: Interacts appropriately 90% of the time - Needs monitoring or encouragement for participation or interaction.  Problem Solving Problem solving assist level: Solves basic 75 - 89% of the time/requires cueing 10 - 24% of the time  Memory Memory assist level: Recognizes or recalls 50 - 74% of the time/requires cueing 25 - 49% of the time   Medical Problem List and Plan:  1. 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  2. DVT Prophylaxis/Anticoagulation: SCDs.    Dopplers suggesting right femoral and posterior tibial DVT and left peroneal DVT - status post IVC filter  placement on 3/9.    -local edema control, elevation 3. Pain Management: Oxycodone as needed    -appropriate splinting adaptive techniques for left leg   -Voltaren gel for knee ordered 4. Mood: Seroquel 25 mg twice a day  5. Neuropsych: This patient is capable of making decisions on his own behalf.  6. Skin/Wound Care: Routine skin checks    -Scalp staples removed   7. Fluids/Electrolytes/Nutrition: Routine I&O's    -On regular diet and eating well. 8. End-stage renal disease. Continue hemodialysis as per renal services. Status post arteriovenous Gore-Tex graft left arm 12/15/2017  -HD scheduled after therapies to maximize therapy time/performance, T, TH, S -chronic foley -volume mgt per nephrology 9. Diabetes mellitus and peripheral neuropathy. Latest hemoglobin A1c 6.4. SSI. Check blood sugars before meals and at bedtime    -continue low-dose Glucotrol QD --generally has been better controlled.  10. Acute on chronic anemia. Continue Aranesp.    Hemoglobin 8.4 on 3/14 11. 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 Monitor for any signs of fluid overload   Filed Weights   12/28/17 1325 12/28/17 1740 12/29/17 1530  Weight: 96.3 kg (212 lb 4.9 oz) 93.4 kg (205 lb 14.6 oz) 93.5 kg (206 lb 2.1 oz)    Volume mgt ongoing per renal   -cxr with mild congestion   -encouraged IS   -albuterol nebs are available prn for wheezing/sob 12. Hypertension. Monitor with increased mobility    Blood pressure controlled 3/19  13. MRSA PCR screening positive. Contact precautions  14. Dysphagia:  Advanced to regular texture diet on 3/8   LOS (Days) [place] EVALUATION WAS PERFORMED      [name], MD 12/29/2017 6:07 PM

## 2017-12-29 NOTE — Discharge Instructions (Signed)
Inpatient Rehab Discharge Instructions  FERRELL CLAIBORNE Discharge date and time: No discharge date for patient encounter.   Activities/Precautions/ Functional Status: Activity: activity as tolerated Diet: diabetic renal diet with 1200 mL fluid restriction Wound Care: none needed Functional status:  ___ No restrictions     ___ Walk up steps independently ___ 24/7 supervision/assistance   ___ Walk up steps with assistance ___ Intermittent supervision/assistance  ___ Bathe/dress independently ___ Walk with walker     _x__ Bathe/dress with assistance ___ Walk Independently    ___ Shower independently ___ Walk with assistance    ___ Shower with assistance ___ No alcohol     ___ Return to work/school ________  Special Instructions:  Continue hemodialysis as directed  No driving  My questions have been answered and I understand these instructions. I will adhere to these goals and the provided educational materials after my discharge from the hospital.  Patient/Caregiver Signature _______________________________ Date __________  Clinician Signature _______________________________________ Date __________  Please bring this form and your medication list with you to all your follow-up doctor's appointments.

## 2017-12-29 NOTE — Progress Notes (Signed)
Physical Therapy Session Note  Patient Details  Name: DORSE LOCY MRN: [id number] Date of Birth: 1946/06/06  Today's Date: 12/29/2017 PT Individual Time: 6144-3154 PT Individual Time Calculation (min): 40 min   Short Term Goals: Week 2:  PT Short Term Goal 1 (Week 2): Pt will initiate gait training for L NMR & strengthening. PT Short Term Goal 2 (Week 2): Pt will complete bed<>w/c with min assist +1.  Skilled Therapeutic Interventions/Progress Updates:   Pt received supine in bed and agreeable to bed level PT.   Supine Neuromotor re-education:  AAROM SLR, x10  SAQ, AROM on the R, AAROM on the L x 12 Hip abduction/adduciton x 12 BLE  Isometric hip IR x 12 .  Clam shells wit manual resistance x 12  Ankle PF with manual resistance x 20  Ankle DF AROM x 20  Heel slides with tactile cues to full ROM x 10 BLE   Pt required prolong rest breaks throughout with moderate cues from PT for improved ROM, proper speed to improve neuromotor control.     Pt left supine in bed with daughter present in room.        Therapy Documentation Precautions:  Precautions Precautions: Fall Precaution Comments: weak left knee (had problems with it before) Restrictions Weight Bearing Restrictions: No Pain: Pain Assessment Pain Assessment: 0-10 Pain Score: 5  Pain Type: Acute pain Pain Location: Generalized Pain Descriptors / Indicators: Aching Pain Onset: On-going Patients Stated Pain Goal: 5 Pain Intervention(s): Medication (See eMAR)   See Function Navigator for Current Functional Status.   Therapy/Group: Individual Therapy  Lorie Phenix 12/29/2017, 5:23 PM

## 2017-12-29 NOTE — Progress Notes (Signed)
[facility] Progress Note   Subjective:  No new issues, wanted to change to 3.5 hrs ofr HD session, told him they would have to take it up at his OP unit.    Objective Vitals:   12/28/17 1630 12/28/17 1700 12/28/17 1730 12/28/17 1740  BP: (!) 107/52 (!) 106/56 (!) 109/48 121/85  Pulse: 81 79 81 80  Resp:    18  Temp:    99.1 F (37.3 C)  TempSrc:    Oral  SpO2:    98%  Weight:    93.4 kg (205 lb 14.6 oz)   Physical Exam General: WNWD elderly male in bed Heart: RRR Lungs: CTAB Abdomen: soft NT Extremities: 1-2 +LE edema, improving, legs wrapped Dialysis Access: L IJ TDC. LUE AVG+bruit   Dialysis: TTS [facility] 4h 88.5kg  L IJ TDC / new LUA AVGG ([place], 3/6)  Hep none (after SDH) Venofer 50mg  IV qHD Mircera 147mcg IV q2wks - last 2/14 Calcitriol 3.72mcg PO qHD  Sensipar 30mg  qd Renvela 800mg - 2AC TID, 2 w/snacks   HD THS schedule using TDC: 3K, 4h, target 3L UF BP permitting, no heparin (SDH)   Assessment: 1. S/p Fall and SDH with craniotomy 2/24, now in CIR  2. ESRD - TTS. Continue on schedule. No heparin d/t SDH 3. Vasuclar access - LIJ TDC/LUE AVG placed 3/6, swelling improving 4. Anemia - Aranesp 150 qSat  5. MBD-  VDRA/Renvela  6. HTN/volume - BP controlled/volume stable  7. Nutrition - Renal diet/viatins  8. LE DVT s/p IVC filter 3/9 9. DM2  10. Chronic indwelling foley hx bladder cancer   Plan - HD tomorrow  Kelly Splinter MD [facility] pgr [phone]   12/29/2017, 12:32 PM   Additional Objective Labs: Basic Metabolic Panel: Recent Labs  Lab 12/23/17 1313 12/28/17 1358  NA 131* 131*  K 4.1 4.8  CL 94* 92*  CO2 24 25  GLUCOSE 97 167*  BUN 39* 53*  CREATININE 7.15* 7.52*  CALCIUM 8.2* 7.8*  PHOS 4.4 4.3   CBC: Recent Labs  Lab 12/23/17 1313 12/28/17 1358  WBC 6.2 5.5  HGB 8.4* 8.3*  HCT 26.0* 26.2*  MCV 95.2 95.6  PLT 171 177   Blood Culture    Component Value Date/Time   SDES URINE, CATHETERIZED  06/26/2017 1652   SPECREQUEST NONE 06/26/2017 1652   CULT >=100,000 COLONIES/mL SERRATIA MARCESCENS (A) 06/26/2017 1652   REPTSTATUS 06/28/2017 FINAL 06/26/2017 1652    Cardiac Enzymes: No results for input(s): CKTOTAL, CKMB, CKMBINDEX, TROPONINI in the last 168 hours. CBG: Recent Labs  Lab 12/28/17 1204 12/28/17 1818 12/28/17 2156 12/29/17 0646 12/29/17 1145  GLUCAP 144* 116* 164* 179* 104*   Iron Studies: No results for input(s): IRON, TIBC, TRANSFERRIN, FERRITIN in the last 72 hours. Lab Results  Component Value Date   INR 1.72 12/09/2017   INR 1.69 12/08/2017   INR 1.59 12/07/2017   Medications:  . allopurinol  300 mg Oral Daily  . amiodarone  200 mg Oral Daily  . bisacodyl  5 mg Oral QHS  . calcitRIOL  0.5 mcg Oral Q T,Th,Sa-HD  . darbepoetin (ARANESP) injection - DIALYSIS  150 mcg Intravenous Q Sat-HD  . diclofenac sodium  2 g Topical TID  . feeding supplement (PRO-STAT SUGAR FREE 64)  30 mL Oral BID  . glipiZIDE  2.5 mg Oral QAC breakfast  . insulin aspart  0-9 Units Subcutaneous TID WC  . multivitamin  1 tablet Oral QHS  . mupirocin  ointment   Nasal BID  . oxybutynin  5 mg Oral BID  . pantoprazole  40 mg Oral QHS  . polyethylene glycol  17 g Oral Daily  . sevelamer carbonate  1,600 mg Oral TID WC  . sodium chloride flush  10-40 mL Intracatheter Q12H

## 2017-12-29 NOTE — Progress Notes (Addendum)
Occupational Therapy Session Note  Patient Details  Name: Albert Stanley MRN: [id number] Date of Birth: 03-27-1946  Today's Date: 12/29/2017 OT Individual Time: 4098-1191, 4782-9562 OT Individual Time Calculation (min): 60 min, 51 min   Short Term Goals: Week 2:  OT Short Term Goal 1 (Week 2): Pt will transfer to BSC/ toilet with mod A +1  OT Short Term Goal 2 (Week 2): Pt will perform sit to stands in prep for clothing mangement with mod A  OT Short Term Goal 3 (Week 2): Pt will don B socks with supervision from seated level OT Short Term Goal 4 (Week 2): Pt will perform functional task in standing using RW for 1 minute   Skilled Therapeutic Interventions/Progress Updates:   Session 1: Pt received in room sitting EOB eating breakfast. Pt educated re use of RW during ADL transfers. Pt completed sit to stand transfer with tactile and verbal cues required for hand placement on RW and LE positioning to facilitate proper body mechanics with min A . Pt then attempted to stand pivot to w/c, requiring heavy manual cues and max A overall. 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 Pt completed UB bathing at sink level with set up to obtain items. Pt completed LB bathing while standing in RW, requiring mod A to maintain standing balance during reaching distally. Pt reported L knee pain during standing and pt was encouraged to take seated rest breaks and to re-position L knee. Pt making great progress during ADL transfers this session, but still requiring significant support during standing level ADL tasks overall. Pt left in w/c with quick release belt, chair alarm activated, and all needs met.   Session 2: Pt received in room bed level. Vc provided for sequencing/UE placement during bed mobility to come EOB from supine, requiring SBA. Pt completed squat pivot transfer with mod A to w/c. Pt demonstrating good  carryover of therapy concepts during ADL transfers, initiating w/c positioning and giving instructions re removal of armrest, etc. Pt then brought to therapy gym. Pt completed squat pivot to mat with mod A. Pt completed sit to stand transfer x3 with RW with mod A. In standing, pt able to remove 1 UE from RW and manipulate table top task with mod A support for static standing balance. Yoga block placed between pt's knee to facilitate more neutral alignment of L LE. Pt able to stand for 90 sec before requiring seated rest break, 2 trials. Extensive visual and verbal cues provided to weight shift to L LE during standing, with pt demo poor ability to take any weight through L LE. Pt completed squat pivot transfer back to w/c with mod A and returned to room. Pt requested to use bathroom. Pt completed toilet transfer using stedy lift d/t fatigue. Pt able to complete sit to stand transfer in stedy with min A, requiring manual cues to tuck pelvis to facilitate upright posture. Pt required max A for posterior peri-care in standing. Pt returned to bed via stedy and required mod A to return to lying. Bed alarm activated and all needs met.   Therapy Documentation Precautions:  Precautions Precautions: Fall Precaution Comments: weak left knee (had problems with it before) Restrictions Weight Bearing Restrictions: No   Pain: Pain Assessment Faces Pain Scale: Hurts even more Pain Type: Chronic pain Pain Location: Knee Pain Orientation: Left Pain Descriptors / Indicators: Aching Pain Onset: On-going Pain Intervention(s):  Repositioned ADL: ADL ADL Comments: see functional navigator  See Function Navigator for Current Functional Status.   Therapy/Group: Individual Therapy  Curtis Sites 12/29/2017, 12:14 PM

## 2017-12-30 ENCOUNTER — Inpatient Hospital Stay (HOSPITAL_COMMUNITY): Payer: Medicare Other

## 2017-12-30 ENCOUNTER — Inpatient Hospital Stay (HOSPITAL_COMMUNITY): Payer: Medicare Other | Admitting: Occupational Therapy

## 2017-12-30 ENCOUNTER — Inpatient Hospital Stay (HOSPITAL_COMMUNITY): Payer: Medicare Other | Admitting: Speech Pathology

## 2017-12-30 LAB — GLUCOSE, CAPILLARY
GLUCOSE-CAPILLARY: 177 mg/dL — AB (ref 65–99)
Glucose-Capillary: 97 mg/dL (ref 65–99)
Glucose-Capillary: 191 mg/dL — ABNORMAL HIGH (ref 65–99)
Glucose-Capillary: 76 mg/dL (ref 65–99)

## 2017-12-30 NOTE — Progress Notes (Signed)
Physical Therapy Session Note  Patient Details  Name: Albert Stanley MRN: [id number] Date of Birth: 1946-01-17  Today's Date: 12/30/2017 PT Individual Time: 0835-0905 PT Individual Time Calculation (min): 30 min   Short Term Goals: Week 2:  PT Short Term Goal 1 (Week 2): Pt will initiate gait training for L NMR & strengthening. PT Short Term Goal 2 (Week 2): Pt will complete bed<>w/c with min assist +1.  Skilled Therapeutic Interventions/Progress Updates:   Donned Tedhose for edema management with total assist. Focused on functional transfers with RW and sit <> stands with focus on technique and hand placement. Initially pt requiring mod assist for transition of hands but after 3 reps performed with overall min assist. Requires min to mod assist during transfer due to assist needed when in single limb stance on L due to LE deformity and weakness, same during pre-gait activities with RW forward and backward stepping 2-3'. Pt reports increased pain in LLE with weightbearing and fearful of leg buckling. Left in care of RNs in room providing medication.   Therapy Documentation Precautions:  Precautions Precautions: Fall Precaution Comments: weak left knee (had problems with it before) Restrictions Weight Bearing Restrictions: No    Pain: Reports pain in L knee and hip - RN notified for pain medication.   See Function Navigator for Current Functional Status.   Therapy/Group: Individual Therapy  [name], PT, DPT  12/30/2017, 9:34 AM

## 2017-12-30 NOTE — Progress Notes (Signed)
Social Work Patient ID: Albert Stanley, male   DOB: 1945/11/02, 72 y.o.   MRN: [id number]   Have reviewed team conference with pt and wife and discussed d/c plans.  Explained to both the I have confirmed that his insurance will not cover several hours each day of HHaide services and that, if they wish to have this service, they would be privately paying for this.  We also discussed level of care pt requires now and goals of min assist w/c level.  Both feel that he is not yet at a level he could d/c directly home and that further therapy is needed.  They have requested change of d/c plan to SNF.  Have alerted tx team and insurance of plan to pursue SNF.  Will begin bed search.  Valerio Pinard, LCSW

## 2017-12-30 NOTE — Progress Notes (Signed)
Speech Language Pathology Daily Session Note  Patient Details  Name: Albert Stanley MRN: [id number] Date of Birth: [birth year]  Today's Date: 12/30/2017 SLP Individual Time: 1450-1540 SLP Individual Time Calculation (min): 50 min  Short Term Goals: Week 2: SLP Short Term Goal 1 (Week 2): Pt will selectively attend to tasks in a moderately distracting environment for 30 minutes with supervision cues for redirection to task.   SLP Short Term Goal 2 (Week 2): Pt will complete semi-complex tasks with Min A verbal cues for functional problem solving.   SLP Short Term Goal 3 (Week 2): Pt will recall semi-complex daily information with Supervision verbal cues for use of external aids.   SLP Short Term Goal 4 (Week 2): Pt will recognize and correct errors in the moment during structured tasks with Min A verbal cues.    Skilled Therapeutic Interventions:  Pt was seen for skilled ST targeting cognitive goals.  SLP facilitated the session with a novel card game targeting recall of new information and problem solving.  Pt needed min assist verbal cues for working memory of task rules and procedures in order to plan and execute a problem solving strategy.  Pt selectively attended to task in a mildly distracting environment for 30 minutes with no cues needed for redirection.  Pt reported pain due to compression stockings cutting into his legs which were swollen .  Stockings removed for pt comfort and RN was made aware.  Pt unable to tolerate leg elevation due to chronic knee pain.  Pt was left in wheelchair with call bell within reach and quick release belt donned.  Continue per current plan of care.    Function:  Eating Eating                 Cognition Comprehension Comprehension assist level: Follows basic conversation/direction with extra time/assistive device  Expression   Expression assist level: Expresses basic needs/ideas: With extra time/assistive device  Social Interaction Social  Interaction assist level: Interacts appropriately 90% of the time - Needs monitoring or encouragement for participation or interaction.  Problem Solving Problem solving assist level: Solves basic 75 - 89% of the time/requires cueing 10 - 24% of the time  Memory Memory assist level: Recognizes or recalls 75 - 89% of the time/requires cueing 10 - 24% of the time    Pain Pain Assessment Pain Scale: 0-10 Pain Score: 0-No pain  Therapy/Group: Individual Therapy  Albert Stanley, Selinda Orion 12/30/2017, 8:08 PM

## 2017-12-30 NOTE — Plan of Care (Signed)
  Problem: RH PAIN MANAGEMENT Goal: RH STG PAIN MANAGED AT OR BELOW PT'S PAIN GOAL Description Pain <4 with min assistance  Outcome: Progressing  Continue to administer prn pain regimen

## 2017-12-30 NOTE — Progress Notes (Signed)
Occupational Therapy Session Note  Patient Details  Name: Albert Stanley MRN: [id number] Date of Birth: 1946-07-01  Today's Date: 12/30/2017 OT Individual Time: 2229-7989 OT Individual Time Calculation (min): 54 min    Short Term Goals: Week 2:  OT Short Term Goal 1 (Week 2): Pt will transfer to BSC/ toilet with mod A +1  OT Short Term Goal 2 (Week 2): Pt will perform sit to stands in prep for clothing mangement with mod A  OT Short Term Goal 3 (Week 2): Pt will don B socks with supervision from seated level OT Short Term Goal 4 (Week 2): Pt will perform functional task in standing using RW for 1 minute   Skilled Therapeutic Interventions/Progress Updates:    Upon entering the room, pt seated in wheelchair with no c/o pain. Pt requesting assistance with toileting for BM. Pt refused to attempt squat pivot onto toilet. Pt transferred via STEDY from wheelchair >toilet with lifting assistance. Pt unable to void during this session. Pt performed peri hygiene as well as washing buttocks with min A for standing balance while in stedy.Pt returning to wheelchair in same manner as above.Pt seated in wheelchair at sink for UB bathing with overall set up A for materials.  Pt declined dressing and requesting to don hospital gown this session. Pt donning B pull over socks with increased time and steady assistance while utilizing figure four position from wheelchair. Pt remained seated in wheelchair at end of session with quick release belt donned and chair alarm activated. Call bell within reach upon exiting the room.   Therapy Documentation Precautions:  Precautions Precautions: Fall Precaution Comments: weak left knee (had problems with it before) Restrictions Weight Bearing Restrictions: No    ADL: ADL ADL Comments: see functional navigator    See Function Navigator for Current Functional Status.   Therapy/Group: Individual Therapy  Gypsy Decant 12/30/2017, 12:31 PM

## 2017-12-30 NOTE — Progress Notes (Signed)
[place] PHYSICAL MEDICINE & REHABILITATION     PROGRESS NOTE    Subjective/Complaints: Slept well again. Had questions about length of HD and whether it could be shortened. Thinks left arm more swollen  ROS: Patient denies fever, rash, sore throat, blurred vision, nausea, vomiting, diarrhea, cough, shortness of breath or chest pain, joint or back pain, headache, or mood change.   Objective: Vital Signs: Blood pressure 106/64, pulse 80, temperature 98.4 F (36.9 C), temperature source Oral, resp. rate 18, weight 93.5 kg (206 lb 2.1 oz), SpO2 98 %. No results found. Recent Labs    12/28/17 1358  WBC 5.5  HGB 8.3*  HCT 26.2*  PLT 177   Recent Labs    12/28/17 1358  NA 131*  K 4.8  CL 92*  GLUCOSE 167*  BUN 53*  CREATININE 7.52*  CALCIUM 7.8*   CBG (last 3)  Recent Labs    12/29/17 1705 12/29/17 2106 12/30/17 0657  GLUCAP 116* 167* 97    Wt Readings from Last 3 Encounters:  12/29/17 93.5 kg (206 lb 2.1 oz)  12/16/17 91.5 kg (201 lb 11.5 oz)  12/04/17 91.3 kg (201 lb 4.5 oz)    Physical Exam:  Constitutional: No distress . Vital signs reviewed. HEENT: EOMI, oral membranes moist Cardiovascular: RRR without murmur. No JVD    Respiratory: CTA Bilaterally without wheezes or rales. Normal effort    GI: BS +, non-tender, non-distended    Ext: tr to 1+ edema RLE, 2+ LLE. 1+ LUE--- swollen still at graft site, sl increased today  Musc: left hip and knee tenderness, left genu valgus deformity--no change Neurological: He is alert and oriented.   . Fair insight and awareness. Still with STM deficits   Motor: RUE: 5/5 proximal to distal LUE: 3/5 proximal to 4-/5 distal RLE: 3/5 HF, KE, 4-/5 ADF LLE: HF 2-/5, KE 2+/5, 3- ADF--stable Psych: pleasant and cooperative. Skin-wounds all healing without drainage  Assessment/Plan: 1. Functional deficits and left HP secondary to right SDH which require 3+ hours per day of interdisciplinary therapy in a comprehensive  inpatient rehab setting. Physiatrist is providing close team supervision and 24 hour management of active medical problems listed below. Physiatrist and rehab team continue to assess barriers to discharge/monitor patient progress toward functional and medical goals.  Function:  Bathing Bathing position   Position: Wheelchair/chair at sink  Bathing parts Body parts bathed by patient: Right arm, Left arm, Front perineal area, Abdomen, Chest, Buttocks, Right upper leg, Left upper leg, Right lower leg, Left lower leg Body parts bathed by helper: Back  Bathing assist Assist Level: Touching or steadying assistance(Pt > 75%)      Upper Body Dressing/Undressing Upper body dressing   What is the patient wearing?: Pull over shirt/dress     Pull over shirt/dress - Perfomed by patient: Thread/unthread right sleeve, Thread/unthread left sleeve, Put head through opening, Pull shirt over trunk Pull over shirt/dress - Perfomed by helper: Pull shirt over trunk Button up shirt - Perfomed by patient: Thread/unthread right sleeve, Pull shirt around back Button up shirt - Perfomed by helper: Thread/unthread left sleeve, Button/unbutton shirt    Upper body assist Assist Level: Supervision or verbal cues      Lower Body Dressing/Undressing Lower body dressing   What is the patient wearing?: Hospital Gown, Underwear, Non-skid slipper socks Underwear - Performed by patient: Thread/unthread right underwear leg, Thread/unthread left underwear leg Underwear - Performed by helper: Pull underwear up/down(standing with RW) Pants- Performed by patient: Thread/unthread left  pants leg, Pull pants up/down Pants- Performed by helper: Thread/unthread right pants leg, Thread/unthread left pants leg, Pull pants up/down Non-skid slipper socks- Performed by patient: Don/doff right sock, Don/doff left sock Non-skid slipper socks- Performed by helper: Don/doff left sock   Socks - Performed by helper: Don/doff right sock,  Don/doff left sock              Lower body assist Assist for lower body dressing: Touching or steadying assistance (Pt > 75%)      Toileting Toileting Toileting activity did not occur: No continent bowel/bladder event Toileting steps completed by patient: Adjust clothing after toileting Toileting steps completed by helper: Performs perineal hygiene, Adjust clothing prior to toileting Toileting Assistive Devices: Other (comment)(stedy)  Toileting assist Assist level: Touching or steadying assistance (Pt.75%)   Transfers Chair/bed transfer   Chair/bed transfer method: Stand pivot Chair/bed transfer assist level: Maximal assist (Pt 25 - 49%/lift and lower) Chair/bed transfer assistive device: Walker, Armrests Mechanical lift: Stedy   Locomotion Ambulation Ambulation activity did not occur: Safety/medical concerns   Max distance: 10' Assist level: Moderate assist (Pt 50 - 74%)   Wheelchair   Type: Manual Max wheelchair distance: 43' Assist Level: Supervision or verbal cues  Cognition Comprehension Comprehension assist level: Follows basic conversation/direction with extra time/assistive device  Expression Expression assist level: Expresses basic needs/ideas: With extra time/assistive device  Social Interaction Social Interaction assist level: Interacts appropriately 90% of the time - Needs monitoring or encouragement for participation or interaction.  Problem Solving Problem solving assist level: Solves basic 75 - 89% of the time/requires cueing 10 - 24% of the time  Memory Memory assist level: Recognizes or recalls 50 - 74% of the time/requires cueing 25 - 49% of the time   Medical Problem List and Plan:  1. 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  2. DVT Prophylaxis/Anticoagulation: SCDs.    Dopplers  suggesting right femoral and posterior tibial DVT and left peroneal DVT - status post IVC filter placement on 3/9.    -local edema control, elevation, discussed with him that his local edema will also increase when his volume is up 3. Pain Management: Oxycodone as needed    -appropriate splinting adaptive techniques for left leg   -Voltaren gel for knee ordered 4. Mood: Seroquel 25 mg twice a day  5. Neuropsych: This patient is capable of making decisions on his own behalf.  6. Skin/Wound Care: Routine skin checks    -Scalp staples removed   7. Fluids/Electrolytes/Nutrition: Routine I&O's    -On regular diet and eating well. 8. End-stage renal disease. Continue hemodialysis as per renal services. Status post arteriovenous Gore-Tex graft left arm 12/15/2017  -HD scheduled after therapies to maximize therapy time/performance, T, TH, S -chronic foley -volume mgt per nephrology -will need to take up HD time with HD team 9. Diabetes mellitus and peripheral neuropathy. Latest hemoglobin A1c 6.4. SSI. Check blood sugars before meals and at bedtime    -continue low-dose Glucotrol QD --generally has been better controlled.  10. Acute on chronic anemia. Continue Aranesp.    Hemoglobin 8.4 on 3/14 11. 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 Monitor for any signs of fluid overload   Filed Weights   12/28/17 1325 12/28/17 1740 12/29/17 1530  Weight: 96.3 kg (212 lb 4.9 oz) 93.4 kg (205 lb 14.6 oz) 93.5 kg (206 lb 2.1 oz)  Volume mgt ongoing per renal   -albuterol nebs are available prn for wheezing/sob 12. Hypertension. Monitor with increased mobility    Blood pressure controlled 3/21 13. MRSA PCR screening positive. Contact precautions  14. Dysphagia:  Advanced to regular texture diet on 3/8   LOS (Days) 14 A FACE TO FACE EVALUATION WAS PERFORMED      [name], MD 12/30/2017 8:35 AM

## 2017-12-30 NOTE — Progress Notes (Signed)
Occupational Therapy Session Note  Patient Details  Name: Albert Stanley MRN: [id number] Date of Birth: 1945-12-24  Today's Date: 12/30/2017 OT Individual Time: 1130-1200 OT Individual Time Calculation (min): 30 min    Short Term Goals: Week 1:  OT Short Term Goal 1 (Week 1): Pt will transfer to BSC/ toilet with mod A +1  OT Short Term Goal 1 - Progress (Week 1): Progressing toward goal OT Short Term Goal 2 (Week 1): Pt will perform sit to stands in prep for clothing mangement with mod A  OT Short Term Goal 2 - Progress (Week 1): Progressing toward goal OT Short Term Goal 3 (Week 1): Pt will self feed after setup with supervision  OT Short Term Goal 3 - Progress (Week 1): Met OT Short Term Goal 4 (Week 1): Pt maintain dynamic sitting balance with supervision during functional tasks OT Short Term Goal 4 - Progress (Week 1): Met Week 2:  OT Short Term Goal 1 (Week 2): Pt will transfer to BSC/ toilet with mod A +1  OT Short Term Goal 2 (Week 2): Pt will perform sit to stands in prep for clothing mangement with mod A  OT Short Term Goal 3 (Week 2): Pt will don B socks with supervision from seated level OT Short Term Goal 4 (Week 2): Pt will perform functional task in standing using RW for 1 minute  Week 3:     Skilled Therapeutic Interventions/Progress Updates:    Focus on transfer training with RW: w/c to recliner. Pt able to come into standing with min A with extra time to transition UEs from arm rests to RW. However pt with difficulty transfers with short little steps due to left knee instability and likely to buckle. Anticipate this is not the safest way for him to transfers. Would like to try slide board transfers with him to decr fall risk in transfers.   Therapy Documentation Precautions:  Precautions Precautions: Fall Precaution Comments: weak left knee (had problems with it before) Restrictions Weight Bearing Restrictions: No General:   Vital Signs: Therapy Vitals Temp:  97.8 F (36.6 C) Temp Source: Oral Pulse Rate: 84 Resp: 17 BP: 131/74 Patient Position (if appropriate): Sitting Oxygen Therapy SpO2: 100 % O2 Device: Room Air Pain: Pain Assessment Pain Score: 4  ADL: ADL ADL Comments: see functional navigator  See Function Navigator for Current Functional Status.   Therapy/Group: Individual Therapy  Willeen Cass [facility] 12/30/2017, 3:37 PM

## 2017-12-31 ENCOUNTER — Inpatient Hospital Stay (HOSPITAL_COMMUNITY): Payer: Medicare Other | Admitting: Speech Pathology

## 2017-12-31 ENCOUNTER — Inpatient Hospital Stay (HOSPITAL_COMMUNITY): Payer: Medicare Other

## 2017-12-31 ENCOUNTER — Inpatient Hospital Stay (HOSPITAL_COMMUNITY): Payer: Medicare Other | Admitting: Physical Therapy

## 2017-12-31 ENCOUNTER — Inpatient Hospital Stay (HOSPITAL_COMMUNITY): Payer: Medicare Other | Admitting: Occupational Therapy

## 2017-12-31 LAB — CBC
HCT: 26.9 % — ABNORMAL LOW (ref 39.0–52.0)
Hemoglobin: 8.8 g/dL — ABNORMAL LOW (ref 13.0–17.0)
MCH: 31.1 pg (ref 26.0–34.0)
MCHC: 32.7 g/dL (ref 30.0–36.0)
MCV: 95.1 fL (ref 78.0–100.0)
PLATELETS: 213 10*3/uL (ref 150–400)
RBC: 2.83 MIL/uL — ABNORMAL LOW (ref 4.22–5.81)
RDW: 17.9 % — AB (ref 11.5–15.5)
WBC: 6.6 10*3/uL (ref 4.0–10.5)

## 2017-12-31 LAB — RENAL FUNCTION PANEL
Albumin: 2.6 g/dL — ABNORMAL LOW (ref 3.5–5.0)
Anion gap: 13 (ref 5–15)
BUN: 71 mg/dL — AB (ref 6–20)
CHLORIDE: 89 mmol/L — AB (ref 101–111)
CO2: 25 mmol/L (ref 22–32)
Calcium: 8.2 mg/dL — ABNORMAL LOW (ref 8.9–10.3)
Creatinine, Ser: 8.63 mg/dL — ABNORMAL HIGH (ref 0.61–1.24)
GFR calc Af Amer: 6 mL/min — ABNORMAL LOW (ref >60)
GFR, EST NON AFRICAN AMERICAN: 5 mL/min — AB (ref >60)
GLUCOSE: 95 mg/dL (ref 65–99)
Phosphorus: 4.2 mg/dL (ref 2.5–4.6)
Potassium: 5.2 mmol/L — ABNORMAL HIGH (ref 3.5–5.1)
Sodium: 127 mmol/L — ABNORMAL LOW (ref 135–145)

## 2017-12-31 LAB — GLUCOSE, CAPILLARY
GLUCOSE-CAPILLARY: 63 mg/dL — AB (ref 65–99)
GLUCOSE-CAPILLARY: 64 mg/dL — AB (ref 65–99)
Glucose-Capillary: 112 mg/dL — ABNORMAL HIGH (ref 65–99)
Glucose-Capillary: 166 mg/dL — ABNORMAL HIGH (ref 65–99)
Glucose-Capillary: 101 mg/dL — ABNORMAL HIGH (ref 65–99)

## 2017-12-31 MED ORDER — MIDODRINE HCL 5 MG PO TABS
10.0000 mg | ORAL_TABLET | Freq: Once | ORAL | Status: AC
  Administered 2018-01-01: 10 mg via ORAL
  Filled 2017-12-31: qty 2

## 2017-12-31 MED ORDER — PENTAFLUOROPROP-TETRAFLUOROETH EX AERO: 1.0000 "application " | INHALATION_SPRAY | CUTANEOUS | Status: DC | PRN

## 2017-12-31 MED ORDER — SODIUM CHLORIDE 0.9 % IV SOLN: 100.0000 mL | INTRAVENOUS | Status: DC | PRN

## 2017-12-31 MED ORDER — ALBUMIN HUMAN 25 % IV SOLN
25.0000 g | Freq: Once | INTRAVENOUS | Status: AC
  Administered 2018-01-01: 25 g via INTRAVENOUS
  Filled 2017-12-31: qty 100

## 2017-12-31 MED ORDER — LIDOCAINE-PRILOCAINE 2.5-2.5 % EX CREA
1.0000 "application " | TOPICAL_CREAM | CUTANEOUS | Status: DC | PRN
  Filled 2017-12-31: qty 5

## 2017-12-31 NOTE — Progress Notes (Signed)
Pt refused dialysis family is aware. He plans to go Saturday.

## 2017-12-31 NOTE — Progress Notes (Signed)
Occupational Therapy Session Note  Patient Details  Name: Albert Stanley MRN: [id number] Date of Birth: 02/18/1946  Today's Date: 12/31/2017 OT Individual Time: 0829-0929 OT Individual Time Calculation (min): 60 min    Short Term Goals: Week 2:  OT Short Term Goal 1 (Week 2): Pt will transfer to BSC/ toilet with mod A +1  OT Short Term Goal 2 (Week 2): Pt will perform sit to stands in prep for clothing mangement with mod A  OT Short Term Goal 3 (Week 2): Pt will don B socks with supervision from seated level OT Short Term Goal 4 (Week 2): Pt will perform functional task in standing using RW for 1 minute   Skilled Therapeutic Interventions/Progress Updates:    Pt greeted supine in bed, agreeable to therapy. Pt educated extensively re continuing OT POC, d/c planning/location, and goal planning. Pt provided with demo re use of slide board for transfers. Pt transferred to EOB with min A required to move L LE off bed, as well as vc to use handrails. Pt required mod A to position sliding board and to set up w/c. Heavy tactile and vc provided for technique and use of slide board, with pt requiring mod A to scoot along board into w/c. Pt indicated liking the slide board transfer and was open to continuing to practice transfers in this way. Pt completed UB bathing at sink with set up. Pt completed 3x sit to stand transfers with min-mod A to perform standing level peri-hygiene. Pt required fluctuating CGA-mod A to maintain static standing balance during functional reaching. Pt completed slide board transfer back to bed with mod A overall, demonstrating good carryover of technique. Heavy vc for UE placement and technique required for pt to scoot along side of bed in sitting. Pt left supine in bed with bed alarm activated and all needs met.   Therapy Documentation Precautions:  Precautions Precautions: Fall Precaution Comments: weak left knee (had problems with it before) Restrictions Weight Bearing  Restrictions: No Pain: Pain Assessment Pain Scale: 0-10 Pain Score: 0-No pain Faces Pain Scale: Hurts even more Pain Type: Acute pain Pain Location: Knee Pain Orientation: Left Pain Descriptors / Indicators: Aching Pain Onset: With Activity ADL: ADL ADL Comments: see functional navigator  See Function Navigator for Current Functional Status.   Therapy/Group: Individual Therapy   H  12/31/2017, 11:39 AM  

## 2017-12-31 NOTE — Progress Notes (Addendum)
  [facility] Progress Note   Subjective:  Seen in room, working with PT. Feels ok today, no CP/dyspnea or other concerns.  Objective Vitals:   12/30/17 1247 12/30/17 1805 12/31/17 0124 12/31/17 0445  BP: 131/74  136/74   Pulse: 84  93   Resp: 17  18   Temp: 97.8 F (36.6 C)  98.3 F (36.8 C)   TempSrc: Oral  Oral   SpO2: 100%     Weight:  92.7 kg (204 lb 5.9 oz)  96.2 kg (212 lb 1.3 oz)   Physical Exam General: Well appearing, NAD Heart: RRR; no murmur Lungs: CTA in upper lobes, faint crackles in B bases Extremities: 1+ LE edema (improving) Dialysis Access: L TDC and L AVG + bruit  Additional Objective Labs: Basic Metabolic Panel: Recent Labs  Lab 12/28/17 1358  NA 131*  K 4.8  CL 92*  CO2 25  GLUCOSE 167*  BUN 53*  CREATININE 7.52*  CALCIUM 7.8*  PHOS 4.3   Liver Function Tests: Recent Labs  Lab 12/28/17 1358  ALBUMIN 2.6*   CBC: Recent Labs  Lab 12/28/17 1358  WBC 5.5  HGB 8.3*  HCT 26.2*  MCV 95.6  PLT 177   Medications:  . allopurinol  300 mg Oral Daily  . amiodarone  200 mg Oral Daily  . bisacodyl  5 mg Oral QHS  . calcitRIOL  0.5 mcg Oral Q T,Th,Sa-HD  . darbepoetin (ARANESP) injection - DIALYSIS  150 mcg Intravenous Q Sat-HD  . diclofenac sodium  2 g Topical TID  . feeding supplement (PRO-STAT SUGAR FREE 64)  30 mL Oral BID  . glipiZIDE  2.5 mg Oral QAC breakfast  . insulin aspart  0-9 Units Subcutaneous TID WC  . multivitamin  1 tablet Oral QHS  . mupirocin ointment   Nasal BID  . oxybutynin  5 mg Oral BID  . pantoprazole  40 mg Oral QHS  . polyethylene glycol  17 g Oral Daily  . sevelamer carbonate  1,600 mg Oral TID WC  . sodium chloride flush  10-40 mL Intracatheter Q12H    Dialysis Orders: TTS [facility] 4h 88.5kg  L IJ TDC / new LUA AVGG ([place], 3/6)  Hep none (after SDH) Venofer 50mg  IV qHD Mircera 149mcg IV q2wks - last 2/14 Calcitriol 3.14mcg PO qHD  Sensipar 30mg  qd Renvela 800mg - 2AC TID, 2 w/snacks    HD THS schedule using TDC: 3K, 4h, target 3L UF BP permitting, no heparin (SDH)  Assessment/Plan: 1. S/p Fall and SDH with craniotomy 2/24, now in CIR. 2. ESRD: Continue HD per TTS. No heparin d/t SDH. 3. Vascular access: LIJ TDC/LUE AVG placed 3/6, swelling improving. 4. Anemia: Hgb 8.3, continue Aranesp 150 qSat  5. 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 Continue VDRA/Renvela  6. HTN/volume - BP controlled/volume stable  7. Nutrition - Renal diet/viatins  8. LE DVT s/p IVC filter 3/9 9. DM2  10. Chronic indwelling foley hx bladder cancer    Veneta Penton, PA-C 12/31/2017, 9:35 AM  [facility] Pager: [phone]  Pt seen, examined and agree w A/P as above.  Kelly Splinter MD [employer] pager [phone]   12/31/2017, 1:56 PM

## 2017-12-31 NOTE — Progress Notes (Signed)
Occupational Therapy Session Note  Patient Details  Name: TAMARICK KOVALCIK MRN: [id number] Date of Birth: 09/22/1946  Today's Date: 12/31/2017 OT Individual Time: 1118-1208 OT Individual Time Calculation (min): 50 min   Skilled Therapeutic Interventions/Progress Updates:    Pt greeted supine in bed, asleep, easily woken. ADL needs met but agreeable to tx. Slideboard transfer<w/c completed with steady assist and pad placed between pt and board to prevent shearing. Pt requiring instruction on technique throughout transfer. Afterwards worked on [employer] and activity tolerance by having pt propel w/c in dayroom and also up ramp in [employer]. Pt requiring HOH cues for proper technique and physical assist at times to avoid objects on Lt side. He was then escorted back to room and reported need to have BM. Pt transferred to toilet with use of Stedy, Max A sit<stand from low w/c. Educated pt to alert nursing staff when he was finished voiding. RN and NT made aware of pts position at session exit.    Therapy Documentation Precautions:  Precautions Precautions: Fall Precaution Comments: weak left knee (had problems with it before) Restrictions Weight Bearing Restrictions: No Pain: Pain Assessment Pain Scale: 0-10 Pain Score: 0-No pain Faces Pain Scale: Hurts even more Pain Type: Acute pain Pain Location: Knee Pain Orientation: Left Pain Descriptors / Indicators: Aching Pain Onset: With Activity ADL: ADL ADL Comments: see functional navigator     See Function Navigator for Current Functional Status.   Therapy/Group: Individual Therapy  Cozetta Seif A Salam Micucci 12/31/2017, 12:50 PM

## 2017-12-31 NOTE — Plan of Care (Signed)
  Problem: RH PAIN MANAGEMENT Goal: RH STG PAIN MANAGED AT OR BELOW PT'S PAIN GOAL Description Pain <4 with min assistance  Outcome: Progressing  Continues to administer prn pain regimen  Problem: RH SAFETY Goal: RH STG ADHERE TO SAFETY PRECAUTIONS W/ASSISTANCE/DEVICE Description STG Adhere to Safety Precautions With min Assistance/Device.  Outcome: Progressing  Bed alarm on at all times, pt like to set on edge of bed.

## 2017-12-31 NOTE — Progress Notes (Addendum)
Physical Therapy Session Note  Patient Details  Name: Albert Stanley MRN: [id number] Date of Birth: 1945/12/25  Today's Date: 12/31/2017 PT Individual Time: 1021-1173 PT Individual Time Calculation (min): 14 min   Short Term Goals: Week 2:  PT Short Term Goal 1 (Week 2): Pt will initiate gait training for L NMR & strengthening. PT Short Term Goal 2 (Week 2): Pt will complete bed<>w/c with min assist +1.  Skilled Therapeutic Interventions/Progress Updates:  Pt received in bed reporting nausea & appearing fatigued. Pt agreeable to exercises in bed & was instructed on the following BLE strengthening exercises: ankle pumps, straight legs raises, hip abduction, and hip adduction squeezes (pt unable to perform straight leg raise & abduction with LLE 2/2 weakness). Therapist provided instructional cuing for exercises. Pt continued to decline OOB activity with eyes closing frequently. Pt left in bed with alarm set & all needs within reach.   Therapy Documentation Precautions:  Precautions Precautions: Fall Precaution Comments: weak left knee (had problems with it before) Restrictions Weight Bearing Restrictions: No General: PT Amount of Missed Time (min): 46 Minutes PT Missed Treatment Reason: Patient fatigue   See Function Navigator for Current Functional Status.   Therapy/Group: Individual Therapy  [name] 12/31/2017, 2:31 PM

## 2017-12-31 NOTE — Progress Notes (Signed)
Called to get report for HD tx, pt refused to come to tx today and this tx was a make up for tx that was scheduled for Albert Stanley, primary nurse stated that his family is aware of his refusal as well, orders re dated for tomorrow

## 2017-12-31 NOTE — Progress Notes (Signed)
Speech Language Pathology Weekly Progress and Session Note  Patient Details  Name: Albert Stanley MRN: [id number] Date of Birth: Jul 20, 1946  Beginning of progress report period: December 24, 2017   End of progress report period: December 31, 2017   Today's Date: 12/31/2017 SLP Individual Time: 4193-7902 SLP Individual Time Calculation (min): 40 min  Short Term Goals: Week 2: SLP Short Term Goal 1 (Week 2): Pt will selectively attend to tasks in a moderately distracting environment for 30 minutes with supervision cues for redirection to task.   SLP Short Term Goal 1 - Progress (Week 2): Met SLP Short Term Goal 2 (Week 2): Pt will complete semi-complex tasks with Min A verbal cues for functional problem solving.   SLP Short Term Goal 2 - Progress (Week 2): Met SLP Short Term Goal 3 (Week 2): Pt will recall semi-complex daily information with Supervision verbal cues for use of external aids.   SLP Short Term Goal 3 - Progress (Week 2): Met SLP Short Term Goal 4 (Week 2): Pt will recognize and correct errors in the moment during structured tasks with Min A verbal cues.   SLP Short Term Goal 4 - Progress (Week 2): Met    New Short Term Goals: Week 3: SLP Short Term Goal 1 (Week 3): Pt will selectively attend to tasks in a moderately distracting environment for 60 minutes with supervision cues for redirection to task.   SLP Short Term Goal 2 (Week 3): Pt will complete semi-complex tasks with supervision verbal cues for functional problem solving.   SLP Short Term Goal 3 (Week 3): Pt will recognize and correct errors in the moment during structured tasks with supervision verbal cues.    Weekly Progress Updates:   Pt has made functional gains this reporting period and has met 4 out of 4 short term goals.  Pt is currently min assist-supervision for semi-complex tasks due to mild cognitive impairment.  Pt has demonstrated improved error awareness, problem solving, recall, and selective attention to  task.  Pt and family education is ongoing.  Discharge plan has changed to SNF.  Pt would continue to benefit from skilled ST while inpatient in order to maximize functional independence and reduce burden of care prior to discharge.  Anticipate that pt will need ST follow up at next level of care.     Intensity: Minumum of 1-2 x/day, 30 to 90 minutes Frequency: 3 to 5 out of 7 days Duration/Length of Stay: 3/29 Treatment/Interventions: Cognitive remediation/compensation;Cueing hierarchy;Dysphagia/aspiration precaution training;Patient/family education;Environmental controls;Functional tasks;Internal/external aids;Therapeutic Activities   Daily Session  Skilled Therapeutic Interventions: Pt was seen for skilled ST targeting cognitive goals.  Pt was resting in bed upon therapist's arrival.  Sat edge of bed for consumption of breakfast tray.  Pt selectively attended to meal in a moderately distracting environment (TV on, door to room open, therapeutic conversation with SLP) for 30 minutes with no cues needed for redirection.  Pt was returned to bed at the end of session with call bell within reach and bed alarm set.  Continue per current plan of care.       Function:   Eating Eating                 Cognition Comprehension Comprehension assist level: Understands complex 90% of the time/cues 10% of the time  Expression   Expression assist level: Expresses complex 90% of the time/cues < 10% of the time  Social Interaction Social Interaction assist level: Interacts appropriately with others with  medication or extra time (anti-anxiety, antidepressant).  Problem Solving Problem solving assist level: Solves basic 75 - 89% of the time/requires cueing 10 - 24% of the time  Memory Memory assist level: Recognizes or recalls 90% of the time/requires cueing < 10% of the time   General    Pain Pain Assessment Pain Scale: 0-10 Pain Score: 0-No pain  Therapy/Group: Individual Therapy  Albert Stanley,  Selinda Orion 12/31/2017, 11:28 AM

## 2017-12-31 NOTE — Progress Notes (Signed)
[facility]     PROGRESS NOTE    Subjective/Complaints: No new issues. Just waking up this morning.   ROS: Patient denies fever, rash, sore throat, blurred vision, nausea, vomiting, diarrhea, cough, shortness of breath or chest pain, joint or back pain, headache, or mood change.   Objective: Vital Signs: Blood pressure 136/74, pulse 93, temperature 98.3 F (36.8 C), temperature source Oral, resp. rate 18, weight 96.2 kg (212 lb 1.3 oz), SpO2 100 %. No results found. Recent Labs    12/28/17 1358  WBC 5.5  HGB 8.3*  HCT 26.2*  PLT 177   Recent Labs    12/28/17 1358  NA 131*  K 4.8  CL 92*  GLUCOSE 167*  BUN 53*  CREATININE 7.52*  CALCIUM 7.8*   CBG (last 3)  Recent Labs    12/30/17 1703 12/30/17 2053 12/31/17 0608  GLUCAP 76 177* 112*    Wt Readings from Last 3 Encounters:  12/31/17 96.2 kg (212 lb 1.3 oz)  12/16/17 91.5 kg (201 lb 11.5 oz)  12/04/17 91.3 kg (201 lb 4.5 oz)    Physical Exam:  Constitutional: No distress . Vital signs reviewed. HEENT: EOMI, oral membranes moist Cardiovascular: RRR without murmur. No JVD    Respiratory: CTA Bilaterally without wheezes or rales. Normal effort    GI: BS +, non-tender, non-distended   Ext: tr to 1+ edema RLE, 2+ LLE. 1+ LUE--- swollen still at graft site   Musc: left hip and knee tenderness, left genu valgus deformity--stable Neurological: He is alert and oriented.   . Fair insight and awareness. STM deficits  Motor: RUE: 5/5 proximal to distal LUE: 3/5 proximal to 4-/5 distal RLE: 3/5 HF, KE, 4-/5 ADF LLE: HF 2-/5, KE 2+/5, 3- ADF-  Psych: pleasant and cooperative. Skin-wounds all healing without drainage  Assessment/Plan: 1. Functional deficits and left HP secondary to right SDH which require 3+ hours per day of interdisciplinary therapy in a comprehensive inpatient rehab setting. Physiatrist is providing close team supervision and 24 hour management of active medical  problems listed below. Physiatrist and rehab team continue to assess barriers to discharge/monitor patient progress toward functional and medical goals.  Function:  Bathing Bathing position   Position: Other (comment)(UB at sink and LB standing in stedy)  Bathing parts Body parts bathed by patient: Right arm, Left arm, Front perineal area, Abdomen, Chest, Buttocks, Right upper leg, Left upper leg, Right lower leg, Left lower leg Body parts bathed by helper: Back  Bathing assist Assist Level: Touching or steadying assistance(Pt > 75%)      Upper Body Dressing/Undressing Upper body dressing   What is the patient wearing?: [place] over shirt/dress - Perfomed by patient: Thread/unthread right sleeve, Thread/unthread left sleeve, Put head through opening, Pull shirt over trunk Pull over shirt/dress - Perfomed by helper: Pull shirt over trunk Button up shirt - Perfomed by patient: Thread/unthread right sleeve, Pull shirt around back Button up shirt - Perfomed by helper: Thread/unthread left sleeve, Button/unbutton shirt    Upper body assist Assist Level: Set up, Supervision or verbal cues   Set up : To obtain clothing/put away  Lower Body Dressing/Undressing Lower body dressing   What is the patient wearing?: Hospital Gown, Non-skid slipper socks Underwear - Performed by patient: Thread/unthread right underwear leg, Thread/unthread left underwear leg Underwear - Performed by helper: Pull underwear up/down(standing with RW) Pants- Performed by patient: Thread/unthread left pants leg, Pull pants up/down Pants-  Performed by helper: Thread/unthread right pants leg, Thread/unthread left pants leg, Pull pants up/down Non-skid slipper socks- Performed by patient: Don/doff right sock, Don/doff left sock Non-skid slipper socks- Performed by helper: Don/doff left sock   Socks - Performed by helper: Don/doff right sock, Don/doff left sock              Lower body assist Assist  for lower body dressing: Touching or steadying assistance (Pt > 75%)      Toileting Toileting Toileting activity did not occur: No continent bowel/bladder event Toileting steps completed by patient: Adjust clothing after toileting Toileting steps completed by helper: Performs perineal hygiene, Adjust clothing prior to toileting, Adjust clothing after toileting Toileting Assistive Devices: Other (comment)(stedy)  Toileting assist Assist level: Touching or steadying assistance (Pt.75%)   Transfers Chair/bed transfer   Chair/bed transfer method: Stand pivot Chair/bed transfer assist level: Moderate assist (Pt 50 - 74%/lift or lower) Chair/bed transfer assistive device: Walker, Armrests Mechanical lift: Stedy   Locomotion Ambulation Ambulation activity did not occur: Safety/medical concerns   Max distance: 3' Assist level: Moderate assist (Pt 50 - 74%)   Wheelchair   Type: Manual Max wheelchair distance: 84' Assist Level: Supervision or verbal cues  Cognition Comprehension Comprehension assist level: Follows basic conversation/direction with extra time/assistive device  Expression Expression assist level: Expresses basic needs/ideas: With extra time/assistive device  Social Interaction Social Interaction assist level: Interacts appropriately 90% of the time - Needs monitoring or encouragement for participation or interaction.  Problem Solving Problem solving assist level: Solves basic 75 - 89% of the time/requires cueing 10 - 24% of the time  Memory Memory assist level: Recognizes or recalls 75 - 89% of the time/requires cueing 10 - 24% of the time   Medical Problem List and Plan:  1. 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    -family has opted for SNF placement 2. DVT Prophylaxis/Anticoagulation: SCDs.     Dopplers suggesting right femoral and posterior tibial DVT and left peroneal DVT - status post IVC filter placement on 3/9.    -local edema control, elevation, for left arm and LE's.  3. Pain Management: Oxycodone as needed    -appropriate splinting adaptive techniques for left leg   -Voltaren gel for knee ordered 4. Mood: improved, no lability 5. Neuropsych: This patient is capable of making decisions on his own behalf.  6. Skin/Wound Care: Routine skin checks    -Scalp staples removed   7. Fluids/Electrolytes/Nutrition: Routine I&O's    -On regular diet and eating well. 8. End-stage renal disease. Continue hemodialysis as per renal services. Status post arteriovenous Gore-Tex graft left arm 12/15/2017  -HD scheduled after therapies to maximize therapy time/performance, T, TH, S -chronic foley -volume mgt per nephrology -will need to take up HD time with HD team 9. Diabetes mellitus and peripheral neuropathy. Latest hemoglobin A1c 6.4. SSI. Check blood sugars before meals and at bedtime    -continue low-dose Glucotrol QD --generally under control 10. Acute on chronic anemia. Continue Aranesp.    Hemoglobin 8.4 on 3/14 11. 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 Monitor for any signs of fluid overload   Filed Weights   12/29/17 1530 12/30/17 1805 12/31/17 0445  Weight: 93.5 kg (206 lb 2.1 oz) 92.7 kg (204 lb 5.9 oz) 96.2 kg (212 lb 1.3 oz)    Volume mgt ongoing per renal   -  albuterol nebs are available prn for wheezing/sob 12. Hypertension. Monitor with increased mobility    Blood pressure controlled 3/21 13. MRSA PCR screening positive. Contact precautions  14. Dysphagia:  Advanced to regular texture diet on 3/8   LOS (Days) [place] EVALUATION WAS PERFORMED      [name], MD 12/31/2017 9:37 AM

## 2017-12-31 NOTE — Progress Notes (Signed)
Pt refused dialysis

## 2018-01-01 ENCOUNTER — Inpatient Hospital Stay (HOSPITAL_COMMUNITY): Payer: Medicare Other | Admitting: Occupational Therapy

## 2018-01-01 LAB — CBC
HEMATOCRIT: 26.7 % — AB (ref 39.0–52.0)
HEMOGLOBIN: 8.8 g/dL — AB (ref 13.0–17.0)
MCH: 30.9 pg (ref 26.0–34.0)
MCHC: 33 g/dL (ref 30.0–36.0)
MCV: 93.7 fL (ref 78.0–100.0)
PLATELETS: 207 10*3/uL (ref 150–400)
RBC: 2.85 MIL/uL — AB (ref 4.22–5.81)
RDW: 17.8 % — ABNORMAL HIGH (ref 11.5–15.5)
WBC: 5.8 10*3/uL (ref 4.0–10.5)

## 2018-01-01 LAB — RENAL FUNCTION PANEL
ANION GAP: 14 (ref 5–15)
Albumin: 2.6 g/dL — ABNORMAL LOW (ref 3.5–5.0)
BUN: 79 mg/dL — ABNORMAL HIGH (ref 6–20)
CHLORIDE: 88 mmol/L — AB (ref 101–111)
CO2: 23 mmol/L (ref 22–32)
CREATININE: 9.54 mg/dL — AB (ref 0.61–1.24)
Calcium: 8.2 mg/dL — ABNORMAL LOW (ref 8.9–10.3)
GFR calc Af Amer: 6 mL/min — ABNORMAL LOW (ref >60)
GFR, EST NON AFRICAN AMERICAN: 5 mL/min — AB (ref >60)
Glucose, Bld: 65 mg/dL (ref 65–99)
Phosphorus: 5 mg/dL — ABNORMAL HIGH (ref 2.5–4.6)
Potassium: 5.6 mmol/L — ABNORMAL HIGH (ref 3.5–5.1)
Sodium: 125 mmol/L — ABNORMAL LOW (ref 135–145)

## 2018-01-01 LAB — GLUCOSE, CAPILLARY
GLUCOSE-CAPILLARY: 97 mg/dL (ref 65–99)
GLUCOSE-CAPILLARY: 125 mg/dL — AB (ref 65–99)
Glucose-Capillary: 87 mg/dL (ref 65–99)
Glucose-Capillary: 80 mg/dL (ref 65–99)

## 2018-01-01 MED ORDER — ALBUMIN HUMAN 25 % IV SOLN
INTRAVENOUS | Status: AC
  Filled 2018-01-01: qty 100

## 2018-01-01 MED ORDER — DARBEPOETIN ALFA 150 MCG/0.3ML IJ SOSY
PREFILLED_SYRINGE | INTRAMUSCULAR | Status: AC
  Administered 2018-01-01: 150 ug via INTRAVENOUS
  Filled 2018-01-01: qty 0.3

## 2018-01-01 MED ORDER — SALINE SPRAY 0.65 % NA SOLN
1.0000 | NASAL | Status: DC | PRN
  Administered 2018-01-01: 1 via NASAL
  Filled 2018-01-01: qty 44

## 2018-01-01 NOTE — Progress Notes (Signed)
Albert Stanley is a 72 y.o. male 07-05-1946 [id number]  Subjective: No new complaints. Feeling OK.  Objective: Vital signs in last 24 hours: Temp:  [97.9 F (36.6 C)-98.4 F (36.9 C)] 97.9 F (36.6 C) (03/23 0341) Pulse Rate:  [81] 81 (03/23 0341) Resp:  [16-18] 18 (03/23 0341) BP: (109-126)/(65-67) 126/67 (03/23 0341) SpO2:  [100 %] 100 % (03/23 0341) Weight:  [96.5 kg (212 lb 11.9 oz)-98.4 kg (216 lb 14.9 oz)] 98.4 kg (216 lb 14.9 oz) (03/23 0341) Weight change: 3.8 kg (8 lb 6 oz) Last BM Date: 12/31/17  Intake/Output from previous day: 03/22 0701 - 03/23 0700 In: 840 [P.O.:840] Out: -   Physical Exam General: No apparent distress   sleeping Lungs: Normal effort. Lungs clear to auscultation, no crackles or wheezes. Cardiovascular: Regular rate and rhythm, no edema Neurological: No new neurological deficits   Lab Results: BMET    Component Value Date/Time   NA 127 (L) 12/31/2017 1908   K 5.2 (H) 12/31/2017 1908   CL 89 (L) 12/31/2017 1908   CO2 25 12/31/2017 1908   GLUCOSE 95 12/31/2017 1908   BUN 71 (H) 12/31/2017 1908   CREATININE 8.63 (H) 12/31/2017 1908   CALCIUM 8.2 (L) 12/31/2017 1908   CALCIUM 8.6 10/29/2011 1000   GFRNONAA 5 (L) 12/31/2017 1908   GFRAA 6 (L) 12/31/2017 1908   CBC    Component Value Date/Time   WBC 6.6 12/31/2017 1908   RBC 2.83 (L) 12/31/2017 1908   HGB 8.8 (L) 12/31/2017 1908   HGB 11.4 (L) 12/30/2006 0949   HCT 26.9 (L) 12/31/2017 1908   HCT 33.7 (L) 12/30/2006 0949   PLT 213 12/31/2017 1908   PLT 248 12/30/2006 0949   MCV 95.1 12/31/2017 1908   MCV 89.8 12/30/2006 0949   MCH 31.1 12/31/2017 1908   MCHC 32.7 12/31/2017 1908   RDW 17.9 (H) 12/31/2017 1908   RDW 15.7 (H) 12/30/2006 0949   LYMPHSABS 0.3 (L) 12/11/2017 0352   LYMPHSABS 1.3 12/30/2006 0949   MONOABS 1.2 (H) 12/11/2017 0352   MONOABS 0.6 12/30/2006 0949   EOSABS 0.1 12/11/2017 0352   EOSABS 0.3 12/30/2006 0949   BASOSABS 0.0 12/11/2017 0352   BASOSABS 0.0  12/30/2006 0949   CBG's (last 3):   Recent Labs    12/31/17 2135 12/31/17 2211 01/01/18 0618  GLUCAP 64* 101* 87   LFT's Lab Results  Component Value Date   ALT 488 (H) 12/11/2017   AST 265 (H) 12/11/2017   ALKPHOS 140 (H) 12/11/2017   BILITOT 1.6 (H) 12/11/2017    Studies/Results: No results found.  Medications:  I have reviewed the patient's current medications. Scheduled Medications: . allopurinol  300 mg Oral Daily  . amiodarone  200 mg Oral Daily  . bisacodyl  5 mg Oral QHS  . calcitRIOL  0.5 mcg Oral Q T,Th,Sa-HD  . darbepoetin (ARANESP) injection - DIALYSIS  150 mcg Intravenous Q Sat-HD  . diclofenac sodium  2 g Topical TID  . feeding supplement (PRO-STAT SUGAR FREE 64)  30 mL Oral BID  . glipiZIDE  2.5 mg Oral QAC breakfast  . insulin aspart  0-9 Units Subcutaneous TID WC  . midodrine  10 mg Oral Once  . multivitamin  1 tablet Oral QHS  . mupirocin ointment   Nasal BID  . oxybutynin  5 mg Oral BID  . pantoprazole  40 mg Oral QHS  . polyethylene glycol  17 g Oral Daily  . sevelamer carbonate  1,600  mg Oral TID WC  . 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 L HP and debility following traumatic SDH with therapeutic craniotomy 2/24. Continue CIR therapy and med mgmt as ongoing 2. DVT s/p IVC filter placement 3/9 as poor anticoag candidate given above 3. ESRD HD TTS - appreciate renal co-mgmt  4. DM2 - SSI + oral glipizide  Length of stay, days: 16   Valerie A. Asa Lente, MD 01/01/2018, 10:57 AM

## 2018-01-01 NOTE — Progress Notes (Addendum)
Occupational Therapy Session Note  Patient Details  Name: Albert Stanley MRN: [id number] Date of Birth: November 13, 1945  Today's Date: 01/01/2018 OT Individual Time: 1510-1550 OT Individual Time Calculation (min): 40 min   Short Term Goals: Week 2:  OT Short Term Goal 1 (Week 2): Pt will transfer to BSC/ toilet with mod A +1  OT Short Term Goal 2 (Week 2): Pt will perform sit to stands in prep for clothing mangement with mod A  OT Short Term Goal 3 (Week 2): Pt will don B socks with supervision from seated level OT Short Term Goal 4 (Week 2): Pt will perform functional task in standing using RW for 1 minute   Skilled Therapeutic Interventions/Progress Updates:    Pt seen for earlier session with RN reporting that he will be taken early for urgently needed dialysis (around 1pm). Missed 45 minutes and also opportunity for makeup therapy time (45 minutes as well).   Addendum: At [facility] pt still in room. RN okay'd therapy until dialysis arrived. Pt greeted supine in bed, declining ADL participation but motivated to get OOB. Tx focus on functional transfers, activity tolerance, and Lt UE AROM/edema mgt. Slideboard transfer<w/c completed with steady assist and cues for hand placement. Pt then self propelled down hallway, weaving around therapist (acting as makeshift therapy cone). He had difficultly with swift turns to Rt so we continued to practice this. At RN station, RN reported dialysis transporter was on the way. Pt returned to room and transferred back to room with slideboard once again. He boosted himself up in bed when it was placed in trendelenberg position. LEs elevated (and Teds were donned at start of session). Pt left with all needs and bed alarm set.   Therapy Documentation Precautions:  Precautions Precautions: Fall Precaution Comments: weak left knee (had problems with it before) Restrictions Weight Bearing Restrictions: No Vital Signs: Therapy Vitals Temp: 98.2 F (36.8 C) Temp  Source: Oral Pulse Rate: 78 Resp: 18 BP: 115/72 Patient Position (if appropriate): Lying Oxygen Therapy SpO2: 100 % O2 Device: Room Air ADL: ADL ADL Comments: see functional navigator     See Function Navigator for Current Functional Status.   Therapy/Group: Individual Therapy  Zarinah Oviatt A Sahalie Beth 01/01/2018, 4:10 PM

## 2018-01-01 NOTE — Progress Notes (Addendum)
[facility] Progress Note   Subjective:  Seen in room, for HD later today. Denies CP, c/o mild dyspnea.  Objective Vitals:   12/31/17 0445 12/31/17 1300 12/31/17 1642 01/01/18 0341  BP:  109/65  126/67  Pulse:  81  81  Resp:  16  18  Temp:  98.4 F (36.9 C)  97.9 F (36.6 C)  TempSrc:  Oral  Oral  SpO2:  100%  100%  Weight: 96.2 kg (212 lb 1.3 oz)  96.5 kg (212 lb 11.9 oz) 98.4 kg (216 lb 14.9 oz)   Physical Exam General: Well appearing, NAD Heart: RRR; no murmur Lungs: CTA in upper lobes, faint crackles in B bases Extremities: 1-2+ LE edema (improving) Dialysis Access: L TDC and L AVG + bruit  Additional Objective Labs: Basic Metabolic Panel: Recent Labs  Lab 12/28/17 1358 12/31/17 1908  NA 131* 127*  K 4.8 5.2*  CL 92* 89*  CO2 25 25  GLUCOSE 167* 95  BUN 53* 71*  CREATININE 7.52* 8.63*  CALCIUM 7.8* 8.2*  PHOS 4.3 4.2   Liver Function Tests: Recent Labs  Lab 12/28/17 1358 12/31/17 1908  ALBUMIN 2.6* 2.6*   CBC: Recent Labs  Lab 12/28/17 1358 12/31/17 1908  WBC 5.5 6.6  HGB 8.3* 8.8*  HCT 26.2* 26.9*  MCV 95.6 95.1  PLT 177 213   CBG: Recent Labs  Lab 12/31/17 1112 12/31/17 1657 12/31/17 2135 12/31/17 2211 01/01/18 0618  GLUCAP 166* 63* 64* 101* 87   Medications: . sodium chloride    . albumin human     . allopurinol  300 mg Oral Daily  . amiodarone  200 mg Oral Daily  . bisacodyl  5 mg Oral QHS  . calcitRIOL  0.5 mcg Oral Q T,Th,Sa-HD  . darbepoetin (ARANESP) injection - DIALYSIS  150 mcg Intravenous Q Sat-HD  . diclofenac sodium  2 g Topical TID  . feeding supplement (PRO-STAT SUGAR FREE 64)  30 mL Oral BID  . glipiZIDE  2.5 mg Oral QAC breakfast  . insulin aspart  0-9 Units Subcutaneous TID WC  . midodrine  10 mg Oral Once  . multivitamin  1 tablet Oral QHS  . mupirocin ointment   Nasal BID  . oxybutynin  5 mg Oral BID  . pantoprazole  40 mg Oral QHS  . polyethylene glycol  17 g Oral Daily  . sevelamer  carbonate  1,600 mg Oral TID WC  . sodium chloride flush  10-40 mL Intracatheter Q12H   Dialysis Orders: TTS [facility] 4h 88.5kg L IJ TDC / new LUA AVGG ([place], 3/6) Hep none (after SDH) Venofer 50mg  IV qHD Mircera 121mcg IV q2wks - last 2/14 Calcitriol 3.83mcg PO qHD  Sensipar 30mg  qd Renvela 800mg - 2AC TID, 2 w/snacks   HD THS schedule using TDC: 3K, 4h, target 3L UF BP permitting, no heparin (SDH)  Assessment/Plan: 1. S/p Fall and SDH with craniotomy 2/24, now in CIR. 2. ESRD: Continue HD per TTS. No heparind/t SDH. 3. Vascular access: LIJ TDC/LUE AVG placed 3/6, swelling improving. 4. Anemia: Hgb 8.8, continue Aranesp 150 qSat  5. 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 Continue VDRA/Renvela  6. HTN/volume - BP controlled/volume stable  7. Nutrition - Renal diet/viatins  8. LE DVT s/p IVC filter 3/9 9. DM2  10. Chronic indwelling foley hx bladder cancer     Veneta Penton, PA-C 01/01/2018, 11:11 AM  [facility] Pager: [phone]  Pt seen, examined and agree w A/P as above.  Rob  [facility] pager [phone]   01/01/2018, 3:24 PM

## 2018-01-01 NOTE — Progress Notes (Signed)
Dialysis treatment completed.  5600 mL ultrafiltrated.  5000 mL net fluid removal.  Patient status unchanged. Lung sounds diminished to ausculation in all fields. BUE edema. Cardiac: Regular rate, bradycardic in 50's.  Cleansed LIJ catheter with chlorhexidine.  Disconnected lines and flushed ports with saline per protocol.  Ports locked with heparin and capped per protocol.    Report given to bedside, RN Lattie Haw.

## 2018-01-02 ENCOUNTER — Inpatient Hospital Stay (HOSPITAL_COMMUNITY): Payer: Medicare Other

## 2018-01-02 LAB — GLUCOSE, CAPILLARY
GLUCOSE-CAPILLARY: 93 mg/dL (ref 65–99)
GLUCOSE-CAPILLARY: 59 mg/dL — AB (ref 65–99)
GLUCOSE-CAPILLARY: 82 mg/dL (ref 65–99)
GLUCOSE-CAPILLARY: 105 mg/dL — AB (ref 65–99)
Glucose-Capillary: 60 mg/dL — ABNORMAL LOW (ref 65–99)
Glucose-Capillary: 78 mg/dL (ref 65–99)
Glucose-Capillary: 64 mg/dL — ABNORMAL LOW (ref 65–99)

## 2018-01-02 NOTE — Progress Notes (Signed)
Hypoglycemic Event  CBG: 59  Treatment: 15 GM carbohydrate snack  Symptoms: None  Follow-up CBG: Time: 1429 CBG Result:78  Possible Reasons for Event: Inadequate meal intake  Comments/MD notified :MD Leschber notified of drop in blood sugar. Glipizide discontinued.     Albert Stanley

## 2018-01-02 NOTE — Progress Notes (Signed)
Albert Stanley is a 72 y.o. male [birth year] [id number]  Subjective: No complaints today. Feeling OK.  Objective: Vital signs in last 24 hours: Temp:  [97.2 F (36.2 C)-99.1 F (37.3 C)] 99.1 F (37.3 C) (03/24 0301) Pulse Rate:  [51-93] 93 (03/24 0301) Resp:  [14-18] 17 (03/24 0301) BP: (84-143)/(43-78) 135/73 (03/24 0301) SpO2:  [94 %-100 %] 99 % (03/24 0301) Weight:  [90.3 kg (199 lb 1.2 oz)-97.1 kg (214 lb 1.1 oz)] 90.3 kg (199 lb 1.2 oz) (03/24 0301) Weight change: 0.6 kg (1 lb 5.2 oz) Last BM Date: 12/31/17  Intake/Output from previous day: 03/23 0701 - 03/24 0700 In: 360 [P.O.:360] Out: 5000   Physical Exam General: No apparent distress   sleeping Lungs: Normal effort. Lungs clear to auscultation, no crackles or wheezes. Cardiovascular: Regular rate and rhythm, no edema Neurological: No new neurological deficits   Lab Results: BMET    Component Value Date/Time   NA 125 (L) 01/01/2018 1742   K 5.6 (H) 01/01/2018 1742   CL 88 (L) 01/01/2018 1742   CO2 23 01/01/2018 1742   GLUCOSE 65 01/01/2018 1742   BUN 79 (H) 01/01/2018 1742   CREATININE 9.54 (H) 01/01/2018 1742   CALCIUM 8.2 (L) 01/01/2018 1742   CALCIUM 8.6 10/29/2011 1000   GFRNONAA 5 (L) 01/01/2018 1742   GFRAA 6 (L) 01/01/2018 1742   CBC    Component Value Date/Time   WBC 5.8 01/01/2018 1724   RBC 2.85 (L) 01/01/2018 1724   HGB 8.8 (L) 01/01/2018 1724   HGB 11.4 (L) 12/30/2006 0949   HCT 26.7 (L) 01/01/2018 1724   HCT 33.7 (L) 12/30/2006 0949   PLT 207 01/01/2018 1724   PLT 248 12/30/2006 0949   MCV 93.7 01/01/2018 1724   MCV 89.8 12/30/2006 0949   MCH 30.9 01/01/2018 1724   MCHC 33.0 01/01/2018 1724   RDW 17.8 (H) 01/01/2018 1724   RDW 15.7 (H) 12/30/2006 0949   LYMPHSABS 0.3 (L) 12/11/2017 0352   LYMPHSABS 1.3 12/30/2006 0949   MONOABS 1.2 (H) 12/11/2017 0352   MONOABS 0.6 12/30/2006 0949   EOSABS 0.1 12/11/2017 0352   EOSABS 0.3 12/30/2006 0949   BASOSABS 0.0 12/11/2017 0352   BASOSABS 0.0 12/30/2006 0949   CBG's (last 3):   Recent Labs    01/02/18 0645 01/02/18 1135 01/02/18 1159  GLUCAP 93 59* 60*   LFT's Lab Results  Component Value Date   ALT 488 (H) 12/11/2017   AST 265 (H) 12/11/2017   ALKPHOS 140 (H) 12/11/2017   BILITOT 1.6 (H) 12/11/2017    Studies/Results: No results found.  Medications:  I have reviewed the patient's current medications. Scheduled Medications: . allopurinol  300 mg Oral Daily  . amiodarone  200 mg Oral Daily  . bisacodyl  5 mg Oral QHS  . calcitRIOL  0.5 mcg Oral Q T,Th,Sa-HD  . darbepoetin (ARANESP) injection - DIALYSIS  150 mcg Intravenous Q Sat-HD  . diclofenac sodium  2 g Topical TID  . feeding supplement (PRO-STAT SUGAR FREE 64)  30 mL Oral BID  . glipiZIDE  2.5 mg Oral QAC breakfast  . insulin aspart  0-9 Units Subcutaneous TID WC  . multivitamin  1 tablet Oral QHS  . mupirocin ointment   Nasal BID  . oxybutynin  5 mg Oral BID  . pantoprazole  40 mg Oral QHS  . polyethylene glycol  17 g Oral Daily  . sevelamer carbonate  1,600 mg Oral TID WC  . 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 L HP and debility following traumatic SDH  with therapeutic craniotomy 2/24. Continue CIR therapy and med mgmt as ongoing 2. DVT s/p IVC filter placement 3/9 as poor anticoag candidate given above 3. ESRD HD TTS - appreciate renal co-mgmt  4. DM2 - SSI + oral glipizide - because of recurrent mild symptomatic hypoglycemia and variable intake, will DC [facility] now and cover only with SSI until po intake improved  Length of stay, days: 17   Jia Mohamed A. Asa Lente, MD 01/02/2018, 12:05 PM

## 2018-01-02 NOTE — Progress Notes (Signed)
Physical Therapy Session Note  Patient Details  Name: Albert Stanley MRN: [id number] Date of Birth: 1946/10/03  Today's Date: 01/02/2018 PT Individual Time: [phone] PT Individual Time Calculation (min): 43 min   Short Term Goals: Week 2:  PT Short Term Goal 1 (Week 2): Pt will initiate gait training for L NMR & strengthening. PT Short Term Goal 2 (Week 2): Pt will complete bed<>w/c with min assist +1.  Skilled Therapeutic Interventions/Progress Updates:   Session focused on functional bed mobility retraining, slideboard transfer with min assist (with more emphasis on breaking it into mini squat pivots to increase clearance of bottom and functional strength through LEs), w/c mobility training for functional UE strengthening and mobility training with verbal cues for efficient technique and improved steering ability noted, and seated LE strengthening on Kinetron x 8 min with rest breaks as needed for reciprocal movement pattern retraining.   Therapy Documentation Precautions:  Precautions Precautions: Fall Precaution Comments: weak left knee (had problems with it before) Restrictions Weight Bearing Restrictions: No   Pain: Denies pain   See Function Navigator for Current Functional Status.   Therapy/Group: Individual Therapy  Albert Stanley, PT, DPT  01/02/2018, 12:13 PM

## 2018-01-02 NOTE — Progress Notes (Signed)
Physical Therapy Weekly Progress Note  Patient Details  Name: Albert Stanley MRN: [id number] Date of Birth: 12/17/1945  Beginning of progress report period: December 25, 2017 End of progress report period: January 02, 2018  Patient has met 2 of 2 short term goals. Pt is making functional gains from w/c level. Due to premorbid issues with L knee, standing and gait has progressed but remains unsafe due to instability and high risk for leg buckling and no ability to catch himself or put a caregiver at risk trying to prevent a fall. Recommendation has been to trial more slideboard transfers/squat pivot to decrease pain in LLE as well as increase safety of transfer consistently. Continuing to address standing and gait with therapists from a therapeutic standpoint. Due to limited family support and home access barriers (split level home), plan is to d/c to SNF to continue further rehab.  Patient continues to demonstrate the following deficits muscle weakness and muscle joint tightness, decreased cardiorespiratoy endurance, decreased memory and decreased standing balance, decreased postural control and decreased balance strategies and therefore will continue to benefit from skilled PT intervention to increase functional independence with mobility.  Patient progressing toward long term goals..  Continue plan of care. Goals were downgraded to min assist w/c level this past week due to progress and now pt to d/c to SNF.  PT Short Term Goals Week 2:  PT Short Term Goal 1 (Week 2): Pt will initiate gait training for L NMR & strengthening. PT Short Term Goal 1 - Progress (Week 2): Met PT Short Term Goal 2 (Week 2): Pt will complete bed<>w/c with min assist +1. PT Short Term Goal 2 - Progress (Week 2): Met Week 3:  PT Short Term Goal 1 (Week 3): = LTGs; goals downgraded to w/c level for safety overall supervision to min assist  Skilled Therapeutic Interventions/Progress Updates:  Ambulation/gait  training;Community reintegration;DME/adaptive equipment instruction;Neuromuscular re-education;Psychosocial support;Stair training;UE/LE Strength taining/ROM;Wheelchair propulsion/positioning;Balance/vestibular training;Discharge planning;Pain management;Skin care/wound management;Therapeutic Activities;UE/LE Coordination activities;Visual/perceptual remediation/compensation;Therapeutic Exercise;Splinting/orthotics;Patient/family education;Functional mobility training;Disease management/prevention;Cognitive remediation/compensation   Therapy Documentation Precautions:  Precautions Precautions: Fall Precaution Comments: weak left knee (had problems with it before) Restrictions Weight Bearing Restrictions: No   See Function Navigator for Current Functional Status.   ,  B[place]cia   B. , PT, DPT  01/02/2018, 12:47 PM   

## 2018-01-03 ENCOUNTER — Inpatient Hospital Stay (HOSPITAL_COMMUNITY): Payer: Medicare Other | Admitting: Speech Pathology

## 2018-01-03 ENCOUNTER — Inpatient Hospital Stay (HOSPITAL_COMMUNITY): Payer: Medicare Other | Admitting: Occupational Therapy

## 2018-01-03 ENCOUNTER — Inpatient Hospital Stay (HOSPITAL_COMMUNITY): Payer: Medicare Other

## 2018-01-03 LAB — BASIC METABOLIC PANEL
Anion gap: 14 (ref 5–15)
BUN: 53 mg/dL — AB (ref 6–20)
CHLORIDE: 88 mmol/L — AB (ref 101–111)
CO2: 23 mmol/L (ref 22–32)
CREATININE: 7.58 mg/dL — AB (ref 0.61–1.24)
Calcium: 8.4 mg/dL — ABNORMAL LOW (ref 8.9–10.3)
GFR calc Af Amer: 7 mL/min — ABNORMAL LOW (ref >60)
GFR, EST NON AFRICAN AMERICAN: 6 mL/min — AB (ref >60)
Glucose, Bld: 118 mg/dL — ABNORMAL HIGH (ref 65–99)
POTASSIUM: 4.8 mmol/L (ref 3.5–5.1)
SODIUM: 125 mmol/L — AB (ref 135–145)

## 2018-01-03 LAB — GLUCOSE, CAPILLARY
GLUCOSE-CAPILLARY: 104 mg/dL — AB (ref 65–99)
GLUCOSE-CAPILLARY: 101 mg/dL — AB (ref 65–99)
GLUCOSE-CAPILLARY: 154 mg/dL — AB (ref 65–99)
Glucose-Capillary: 100 mg/dL — ABNORMAL HIGH (ref 65–99)
Glucose-Capillary: 150 mg/dL — ABNORMAL HIGH (ref 65–99)

## 2018-01-03 NOTE — Progress Notes (Signed)
Occupational Therapy Weekly Progress Note  Patient Details  Name: Albert Stanley MRN: [id number] Date of Birth: Mar 05, 1946  Beginning of progress report period: 12/24/17 End of progress report period: 01/03/18  Patient has met 3 of 4 short term goals.    Pt has made steady progress at time of report. He can now complete stand pivot transfers with use of RW and Mod A, and also slideboard transfers with Min-Mod A. Pt completes bathing at sit<stand level without use of Stedy, an improvement from last report period. His activity tolerance has improved during sessions as well. Pt continues to be limited by unstable Lt knee, cognitive deficits, LE edema, and general weakness. OT will continue to address stated deficits for safe transition to next venue of care.   Patient continues to demonstrate the following deficits: muscle weakness, decreased cardiorespiratoy endurance, decreased awareness, decreased problem solving, decreased safety awareness and decreased memory and decreased sitting balance, decreased standing balance, decreased postural control and decreased balance strategies and therefore will continue to benefit from skilled OT intervention to enhance overall performance with BADL.  Patient progressing toward long term goals..  Continue plan of care.  OT Short Term Goals Week 2:  OT Short Term Goal 1 (Week 2): Pt will transfer to BSC/ toilet with mod A +1  OT Short Term Goal 1 - Progress (Week 2): Progressing toward goal OT Short Term Goal 2 (Week 2): Pt will perform sit to stands in prep for clothing mangement with mod A  OT Short Term Goal 2 - Progress (Week 2): Met OT Short Term Goal 3 (Week 2): Pt will don B socks with supervision from seated level OT Short Term Goal 3 - Progress (Week 2): Met OT Short Term Goal 4 (Week 2): Pt will perform functional task in standing using RW for 1 minute  OT Short Term Goal 4 - Progress (Week 2): Met Week 3:  OT Short Term Goal 1 (Week 3):  STGs=LTGs due to ELOS  Therapy Documentation Precautions:  Precautions Precautions: Fall Precaution Comments: weak left knee (had problems with it before) Restrictions Weight Bearing Restrictions: No General:   Vital Signs: Therapy Vitals Temp: 98.5 F (36.9 C) Temp Source: Oral Pulse Rate: 82 Resp: 16 BP: 130/74 Patient Position (if appropriate): Sitting Oxygen Therapy SpO2: 100 % O2 Device: Room Air Pain: Pain Assessment Pain Scale: 0-10 Pain Score: 4  Pain Type: Chronic pain Pain Location: Knee Pain Orientation: Left Pain Intervention(s): Medication (See eMAR);Repositioned ADL: ADL ADL Comments: see functional navigator     See Function Navigator for Current Functional Status.   Therapy/Group: Individual Therapy  Ezel Vallone A Madoc Holquin 01/03/2018, 5:02 PM

## 2018-01-03 NOTE — NC FL2 (Deleted)
[place] LEVEL OF CARE SCREENING TOOL     IDENTIFICATION  Patient Name: Albert Stanley Birthdate: [birth year] Sex: male Admission Date (Current Location): 12/16/2017  [facility] and [place] Number:  Herbalist and Address:  [facility], [place] [address] [place], [place] [address]      Provider Number: [id number]  Attending Physician Name and Address:  [name], MD  Relative Name and Phone Number:       Current Level of Care: Other (Comment)(Acute Inpatient Rehab) Recommended Level of Care: [place] Prior Approval Number:    Date Approved/Denied:   PASRR Number: [id number]  Discharge Plan: SNF    Current Diagnoses: Patient Active Problem List   Diagnosis Date Noted  . Labile blood pressure   . Essential hypertension   . Acute on chronic diastolic heart failure ([place])   . Type 2 diabetes mellitus with peripheral neuropathy ([name])   . Acute deep vein thrombosis (DVT) of femoral vein of right lower extremity ([place])   . Traumatic subdural hematoma ([place]) 12/16/2017  . ESRD (end stage renal disease) ([place])   . ESRD (end stage renal disease) on dialysis ([place])   . Dysphagia   . Acute blood loss anemia   . Anemia of chronic disease   . Chronic diastolic congestive heart failure ([place])   . Diabetes mellitus type 2 in nonobese ([name])   . Benign essential HTN   . Nonischemic cardiomyopathy ([place])   . Transaminitis   . Encounter for central line placement   . Endotracheally intubated   . SDH (subdural hematoma) ([place])   . Ventilator dependent ([place])   . Chronic renal impairment   . Chronic in-center hemodialysis status ([place])   . S/P craniotomy 12/05/2017  . Hyperkalemia 12/02/2017  . Malnutrition of moderate degree 12/18/2016  . PVC's (premature ventricular contractions)   . Anemia associated with chronic renal failure   . End-stage renal disease on hemodialysis ([place]) 12/09/2016  . Dyslipidemia associated  with type 2 diabetes mellitus ([place]) 12/09/2016  . Acute on chronic systolic and diastolic heart failure, NYHA class 1 ([place]) 12/09/2016  . 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 Type 2 diabetes mellitus ([place]) 12/11/2015  . Fluid overload 12/06/2015  . History of DVT (deep vein thrombosis) 12/04/2015  . Renal hematoma 12/04/2015  . H/O: gout 11/16/2013  . Trochanteric bursitis 10/17/2013  . 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 Lumbar degenerative disc disease 07/17/2013    Orientation RESPIRATION BLADDER Height & Weight     Self, Place, Situation, Time  Normal Indwelling catheter(Chronic foley and ESRD) Weight: 93.7 kg (206 lb 9.1 oz) Height:     BEHAVIORAL SYMPTOMS/MOOD NEUROLOGICAL BOWEL NUTRITION STATUS      Continent Diet(regular, thin)  AMBULATORY STATUS COMMUNICATION OF NEEDS Skin   Extensive Assist Verbally Normal                       Personal Care Assistance Level of Assistance  Bathing, Feeding, Dressing Bathing Assistance: Limited assistance Feeding assistance: Limited assistance Dressing Assistance: Limited assistance     Functional Limitations Info             SPECIAL CARE FACTORS FREQUENCY  PT (By licensed PT), OT (By licensed OT)     PT Frequency: 5x/wk OT Frequency: 5x/wk            Contractures Contractures Info: Not present  Additional Factors Info  Code Status, Allergies, Insulin Sliding Scale Code Status Info: full Allergies Info: see d/c summary   Insulin Sliding Scale Info: see d/c summary       Current Medications (01/03/2018):  This is the current hospital active medication list Current Facility-Administered Medications  Medication Dose Route Frequency Provider Last Rate Last Dose  . acetaminophen (TYLENOL) tablet 650 mg  650 mg Oral Q6H PRN Cathlyn Parsons, PA-C   650 mg at 12/20/17 1150  . albuterol (PROVENTIL) (2.5 MG/3ML) 0.083% nebulizer solution 2.5 mg  2.5 mg  Nebulization Q4H PRN Angiulli, Lavon Paganini, PA-C      . allopurinol (ZYLOPRIM) tablet 300 mg  300 mg Oral Daily Cathlyn Parsons, PA-C   300 mg at 01/03/18 0846  . amiodarone (PACERONE) tablet 200 mg  200 mg Oral Daily Cathlyn Parsons, PA-C   200 mg at 01/03/18 0846  . bisacodyl (DULCOLAX) EC tablet 10 mg  10 mg Oral Daily PRN Cathlyn Parsons, PA-C   10 mg at 01/03/18 0249  . bisacodyl (DULCOLAX) EC tablet 5 mg  5 mg Oral QHS AngiulliLavon Paganini, PA-C   5 mg at 01/01/18 2244  . bisacodyl (DULCOLAX) suppository 10 mg  10 mg Rectal Daily PRN Jamse Arn, MD   10 mg at 12/21/17 0250  . calcitRIOL (ROCALTROL) capsule 0.5 mcg  0.5 mcg Oral Q T,Th,Sa-HD Cathlyn Parsons, PA-C   0.5 mcg at 12/30/17 1139  . Darbepoetin Alfa (ARANESP) injection 150 mcg  150 mcg Intravenous Q Sat-HD Roney Jaffe, MD   150 mcg at 01/01/18 2100  . diclofenac sodium (VOLTAREN) 1 % transdermal gel 2 g  2 g Topical TID [name], MD   2 g at 01/03/18 0850  . diphenhydrAMINE (BENADRYL) capsule 25 mg  25 mg Oral Q6H PRN Jamse Arn, MD   25 mg at 01/01/18 0319  . feeding supplement (PRO-STAT SUGAR FREE 64) liquid 30 mL  30 mL Oral BID [name], MD   30 mL at 01/03/18 0845  . heparin injection 1,000 Units  1,000 Units Dialysis PRN AngiulliLavon Paganini, PA-C      . insulin aspart (novoLOG) injection 0-9 Units  0-9 Units Subcutaneous TID WC Cathlyn Parsons, PA-C   2 Units at 12/31/17 1249  . multivitamin (RENA-VIT) tablet 1 tablet  1 tablet Oral QHS Cathlyn Parsons, PA-C   1 tablet at 01/02/18 2050  . mupirocin ointment (BACTROBAN) 2 %   Nasal BID Angiulli, Lavon Paganini, PA-C      . MUSCLE RUB CREA   Topical PRN Cathlyn Parsons, PA-C      . ondansetron [facility]) tablet 4 mg  4 mg Oral Q6H PRN Cathlyn Parsons, PA-C   4 mg at 01/02/18 1228   Or  . ondansetron (ZOFRAN) injection 4 mg  4 mg Intravenous Q6H PRN Cathlyn Parsons, PA-C   4 mg at 12/25/17 1033  . oxybutynin (DITROPAN) 5 MG/5ML  syrup 5 mg  5 mg Oral BID Cathlyn Parsons, PA-C   5 mg at 01/03/18 0846  . oxyCODONE-acetaminophen (PERCOCET) 7.5-325 MG per tablet 1 tablet  1 tablet Oral Q6H PRN Cathlyn Parsons, PA-C   1 tablet at 01/03/18 0855  . pantoprazole (PROTONIX) EC tablet 40 mg  40 mg Oral QHS [name], MD   40 mg at 01/02/18 2050  . polyethylene glycol (MIRALAX / GLYCOLAX) packet 17 g  17 g Oral Daily  Cathlyn Parsons, PA-C   17 g at 01/03/18 2897  . sevelamer carbonate (RENVELA) tablet 1,600 mg  1,600 mg Oral TID WC Roney Jaffe, MD   1,600 mg at 01/03/18 0845  . sodium chloride (OCEAN) 0.65 % nasal spray 1 spray  1 spray Each Nare PRN Rowe Clack, MD   1 spray at 01/01/18 1139  . sodium chloride flush (NS) 0.9 % injection 10-40 mL  10-40 mL Intracatheter Q12H [name], MD   10 mL at 01/02/18 2036  . sodium chloride flush (NS) 0.9 % injection 10-40 mL  10-40 mL Intracatheter PRN [name], MD      . sorbitol 70 % solution 30 mL  30 mL Oral Daily PRN Cathlyn Parsons, PA-C   30 mL at 12/19/17 1543     Discharge Medications: Please see discharge summary for a list of discharge medications.  Relevant Imaging Results:  Relevant Lab Results:   Additional Information Pt on contact precautions due to MRSA  Cutler Sunday, LCSW

## 2018-01-03 NOTE — Progress Notes (Signed)
[place] PHYSICAL MEDICINE & REHABILITATION     PROGRESS NOTE    Subjective/Complaints: Asked me about swelling in left arm (again). Also wanted to know if he could have pain medication more often.   ROS: Patient denies fever, rash, sore throat, blurred vision, nausea, vomiting, diarrhea, cough, shortness of breath or chest pain, joint or back pain, headache, or mood change.    Objective: Vital Signs: Blood pressure (!) 145/94, pulse 89, temperature 98.3 F (36.8 C), temperature source Oral, resp. rate 16, weight 93.7 kg (206 lb 9.1 oz), SpO2 98 %. No results found. Recent Labs    12/31/17 1908 01/01/18 1724  WBC 6.6 5.8  HGB 8.8* 8.8*  HCT 26.9* 26.7*  PLT 213 207   Recent Labs    12/31/17 1908 01/01/18 1742  NA 127* 125*  K 5.2* 5.6*  CL 89* 88*  GLUCOSE 95 65  BUN 71* 79*  CREATININE 8.63* 9.54*  CALCIUM 8.2* 8.2*   CBG (last 3)  Recent Labs    01/02/18 2129 01/03/18 0301 01/03/18 0618  GLUCAP 105* 100* 104*    Wt Readings from Last 3 Encounters:  01/03/18 93.7 kg (206 lb 9.1 oz)  12/16/17 91.5 kg (201 lb 11.5 oz)  12/04/17 91.3 kg (201 lb 4.5 oz)    Physical Exam:  Constitutional: No distress . Vital signs reviewed. HEENT: EOMI, oral membranes moist Cardiovascular: RRR without murmur. No JVD    Respiratory: CTA Bilaterally without wheezes or rales. Normal effort    GI: BS +, non-tender, non-distended   Ext: tr to 1+ edema RLE, 2+ LLE. 1+ LUE--- remains swollenat graft site   Musc: left hip and knee tenderness, left genu valgus deformity--stable Neurological: He is alert and oriented.   . Improving insight and awareness. STM deficits persists Motor: RUE: 5/5 proximal to distal LUE: 3/5 proximal to 4-/5 distal RLE: 3/5 HF, KE, 4-/5 ADF LLE: HF 2-/5, KE 2+/5, 3- ADF-  Psych: pleasant and cooperative. Skin-wounds all healing without drainage  Assessment/Plan: 1. Functional deficits and left HP secondary to right SDH which require 3+ hours per  day of interdisciplinary therapy in a comprehensive inpatient rehab setting. Physiatrist is providing close team supervision and 24 hour management of active medical problems listed below. Physiatrist and rehab team continue to assess barriers to discharge/monitor patient progress toward functional and medical goals.  Function:  Bathing Bathing position   Position: Wheelchair/chair at sink  Bathing parts Body parts bathed by patient: Right arm, Left arm, Front perineal area, Abdomen, Chest, Buttocks, Right upper leg, Left upper leg, Right lower leg, Left lower leg Body parts bathed by helper: Back  Bathing assist Assist Level: Touching or steadying assistance(Pt > 75%)      Upper Body Dressing/Undressing Upper body dressing   What is the patient wearing?: Hospital gown, Pull over shirt/dress     Pull over shirt/dress - Perfomed by patient: Thread/unthread right sleeve, Thread/unthread left sleeve, Put head through opening, Pull shirt over trunk Pull over shirt/dress - Perfomed by helper: Pull shirt over trunk Button up shirt - Perfomed by patient: Thread/unthread right sleeve, Pull shirt around back Button up shirt - Perfomed by helper: Thread/unthread left sleeve, Button/unbutton shirt    Upper body assist Assist Level: Supervision or verbal cues   Set up : To obtain clothing/put away  Lower Body Dressing/Undressing Lower body dressing   What is the patient wearing?: Non-skid slipper socks, Ted Hose Underwear - Performed by patient: Thread/unthread right underwear leg, Thread/unthread left underwear  leg Underwear - Performed by helper: Pull underwear up/down(standing with RW) Pants- Performed by patient: Thread/unthread left pants leg, Pull pants up/down Pants- Performed by helper: Thread/unthread right pants leg, Thread/unthread left pants leg, Pull pants up/down Non-skid slipper socks- Performed by patient: Don/doff right sock, Don/doff left sock Non-skid slipper socks-  Performed by helper: Don/doff right sock, Don/doff left sock   Socks - Performed by helper: Don/doff right sock, Don/doff left sock           TED Hose - Performed by helper: Don/doff right TED hose, Don/doff left TED hose  Lower body assist Assist for lower body dressing: Touching or steadying assistance (Pt > 75%)      Toileting Toileting Toileting activity did not occur: No continent bowel/bladder event Toileting steps completed by patient: Adjust clothing after toileting Toileting steps completed by helper: Performs perineal hygiene, Adjust clothing prior to toileting, Adjust clothing after toileting Toileting Assistive Devices: Other (comment)(stedy)  Toileting assist Assist level: Touching or steadying assistance (Pt.75%)   Transfers Chair/bed transfer   Chair/bed transfer method: Stand pivot Chair/bed transfer assist level: Touching or steadying assistance (Pt > 75%) Chair/bed transfer assistive device: Sliding board, Armrests Mechanical lift: Stedy   Locomotion Ambulation Ambulation activity did not occur: Safety/medical concerns   Max distance: 3' Assist level: Moderate assist (Pt 50 - 74%)   Wheelchair   Type: Manual Max wheelchair distance: 56' Assist Level: Supervision or verbal cues  Cognition Comprehension Comprehension assist level: Understands complex 90% of the time/cues 10% of the time  Expression Expression assist level: Expresses complex 90% of the time/cues < 10% of the time  Social Interaction Social Interaction assist level: Interacts appropriately with others with medication or extra time (anti-anxiety, antidepressant).  Problem Solving Problem solving assist level: Solves basic 75 - 89% of the time/requires cueing 10 - 24% of the time  Memory Memory assist level: Recognizes or recalls 90% of the time/requires cueing < 10% of the time   Medical Problem List and Plan:  1. Decreased functional mobility with left-sided weakness and dysphagia secondary  to traumatic right subdural hematoma status post right frontotemporal parietal craniotomy 12/05/2017    Cont CIR PT, OT, SLP therapies.    -SNF pending 2. DVT Prophylaxis/Anticoagulation: SCDs.    Dopplers suggesting right femoral and posterior tibial DVT and left peroneal DVT - status post IVC filter placement on 3/9.    -local edema control, elevation, for left arm and LE's.  3. Pain Management: Oxycodone as needed    -appropriate splinting adaptive techniques for left leg   -Voltaren gel for knee ordered  -discussed reasons why we are trying to limit narcs and that he has other options/modaliites for pain relief as well.  4. Mood: improved, no lability 5. Neuropsych: This patient is capable of making decisions on his own behalf.  6. Skin/Wound Care: Routine skin checks    -Scalp staples removed   7. Fluids/Electrolytes/Nutrition: Routine I&O's    -On regular diet and eating well. 8. End-stage renal disease. Continue hemodialysis as per renal services. Status post arteriovenous Gore-Tex graft left arm 12/15/2017  -HD scheduled after therapies to maximize therapy time/performance, T, TH, S -chronic foley -volume mgt per nephrology, local control of edema -will need to take up HD time with HD team 9. Diabetes mellitus and peripheral neuropathy. Latest hemoglobin A1c 6.4. SSI. Check blood sugars before meals and at bedtime    -continue low-dose Glucotrol QD --tight under control 10. Acute on chronic anemia. Continue Aranesp.  Hemoglobin 8.8 on 3/23 11. 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 Monitor for any signs of fluid overload   Filed Weights   01/01/18 2117 01/02/18 0301 01/03/18 0126  Weight: 92.1 kg (203 lb 0.7 oz) 90.3 kg (199 lb 1.2 oz) 93.7 kg (206 lb 9.1 oz)    Volume mgt ongoing per renal   -albuterol nebs are available prn for wheezing/sob 12. Hypertension. Monitor with increased mobility    Blood pressure controlled 3/25 13. MRSA PCR screening  positive. Contact precautions  14. Dysphagia:  Advanced to regular texture diet on 3/8   LOS (Days) 18 A FACE TO FACE EVALUATION WAS PERFORMED      [name], MD 01/03/2018 9:09 AM

## 2018-01-03 NOTE — Progress Notes (Signed)
Occupational Therapy Session Note  Patient Details  Name: Albert Stanley MRN: [id number] Date of Birth: [birth year]  Today's Date: 01/03/2018 OT Individual Time: 1101-1203 OT Individual Time Calculation (min): 62 min    Short Term Goals: Week 2:  OT Short Term Goal 1 (Week 2): Pt will transfer to BSC/ toilet with mod A +1  OT Short Term Goal 2 (Week 2): Pt will perform sit to stands in prep for clothing mangement with mod A  OT Short Term Goal 3 (Week 2): Pt will don B socks with supervision from seated level OT Short Term Goal 4 (Week 2): Pt will perform functional task in standing using RW for 1 minute   Skilled Therapeutic Interventions/Progress Updates:    Pt received in room supine in bed. Pt agreeable to therapy with no c/o pain supine. Pt educated re set up of slideboard transfer from bed to w/c, with mod A required for problem solving through w/c positioning. Min A overall provided during slideboard transfer, with frequent vc required for UE/LE placement and optimal body mechanics during transfer. Pt complained of L knee pain during transfer. Pt then propelled w/c 128ft in hallway, requiring increase time and 3 rest breaks d/t fatigue. Pt brought to ADL suite and practiced simulated walk in shower transfer from seated level. Pt able to clear lip of shower with R leg independently, but requiring min A to consistently clear L leg d/t knee instability/pain. Pt then completed 63min on B UE ergometer to increase functional activity tolerance through B UE during w/c propulsion and RW use. Pt educated re use of muscle pumping to reduce L UE edema. Pt completed 14ft of w/c propulsion back to room, requiring vc for motivation and technique to maximize propulsion distance with every push. Pt used slideboard to return to bed, requiring min A overall. Vc required for use of bed features for bed mobility and to reposition. Pt left in room with bed alarm on and nursing staff member present.    Therapy  Documentation Precautions:  Precautions Precautions: Fall Precaution Comments: weak left knee (had problems with it before) Restrictions Weight Bearing Restrictions: No Pain: Pain Assessment Pain Scale: 0-10 Pain Score: 0-No pain Faces Pain Scale: Hurts little more Pain Type: Chronic pain Pain Location: Knee Pain Orientation: Left Pain Descriptors / Indicators: Aching Pain Intervention(s): Repositioned ADL: ADL ADL Comments: see functional navigator  See Function Navigator for Current Functional Status.   Therapy/Group: Individual Therapy  Curtis Sites 01/03/2018, 12:12 PM

## 2018-01-03 NOTE — Progress Notes (Signed)
Speech Language Pathology Daily Session Note  Patient Details  Name: Albert Stanley MRN: [id number] Date of Birth: 08-02-1946  Today's Date: 01/03/2018 SLP Individual Time: 0830-0930 SLP Individual Time Calculation (min): 60 min  Short Term Goals: Week 3: SLP Short Term Goal 1 (Week 3): Pt will selectively attend to tasks in a moderately distracting environment for 60 minutes with supervision cues for redirection to task.   SLP Short Term Goal 2 (Week 3): Pt will complete semi-complex tasks with supervision verbal cues for functional problem solving.   SLP Short Term Goal 3 (Week 3): Pt will recognize and correct errors in the moment during structured tasks with supervision verbal cues.    Skilled Therapeutic Interventions: Skilled treatment session focused on cognitive goals. SLP facilitated session by providing Min A verbal cues for problem solving and recall of new information during a complex abstract reasoning and deductive reasoning task. Pt required Min A verbal cues to self-monitor and correct errors throughout task. Pt demonstrated selective attention for ~60 minutes in a moderately distracting environment with Mod I. Pt left upright in wheelchair with alarm on, quick release belt on, and all needs within reach. Continue with current plan of care.      Function:  Cognition Comprehension Comprehension assist level: Understands complex 90% of the time/cues 10% of the time  Expression   Expression assist level: Expresses complex 90% of the time/cues < 10% of the time  Social Interaction Social Interaction assist level: Interacts appropriately with others with medication or extra time (anti-anxiety, antidepressant).  Problem Solving Problem solving assist level: Solves basic 90% of the time/requires cueing < 10% of the time  Memory Memory assist level: Recognizes or recalls 90% of the time/requires cueing < 10% of the time    Pain Pain Assessment Pain Scale: 0-10 Pain Score:  0-No pain   Therapy/Group: Individual Therapy  Meredeth Ide  SLP - Student 01/03/2018, 11:39 AM

## 2018-01-03 NOTE — Plan of Care (Signed)
  Problem: Consults Goal: RH BRAIN INJURY PATIENT EDUCATION Description Description: See Patient Education module for eduction specifics Outcome: Progressing Goal: Skin Care Protocol Initiated - if Braden Score 18 or less Description If consults are not indicated, leave blank or document N/A Outcome: Progressing Goal: Diabetes Guidelines if Diabetic/Glucose > 140 Description If diabetic or lab glucose is > 140 mg/dl - Initiate Diabetes/Hyperglycemia Guidelines & Document Interventions  Outcome: Progressing   Problem: RH BOWEL ELIMINATION Goal: RH STG MANAGE BOWEL WITH ASSISTANCE Description STG Manage Bowel with  Mod Assistance.  Outcome: Progressing Flowsheets (Taken 01/03/2018 1216) STG: Pt will manage bowels with assistance: 6-Modified independent Goal: RH STG MANAGE BOWEL W/MEDICATION W/ASSISTANCE Description STG Manage Bowel with Medication with mod Assistance.  Outcome: Progressing Flowsheets (Taken 01/03/2018 1216) STG: Pt will manage bowels with medication with assistance: 6-Modified independent   Problem: RH BLADDER ELIMINATION Goal: RH STG MANAGE BLADDER WITH EQUIPMENT WITH ASSISTANCE Description STG Manage Bladder With Equipment With max Assistance- foley bag management/care   Outcome: Progressing Flowsheets (Taken 01/03/2018 1216) STG: Pt will manage bladder with equipment with assistance: 1-Total assistance   Problem: RH SKIN INTEGRITY Goal: RH STG SKIN FREE OF INFECTION/BREAKDOWN Description Skin free of infection/breakdown with min assistance  Outcome: Progressing Goal: RH STG MAINTAIN SKIN INTEGRITY WITH ASSISTANCE Description STG Maintain Skin Integrity With mod Assistance.  Outcome: Progressing Flowsheets (Taken 01/03/2018 1216) STG: Maintain skin integrity with assistance: 4-Minimal assistance Goal: RH STG ABLE TO PERFORM INCISION/WOUND CARE W/ASSISTANCE Description STG Able To Perform Incision/Wound Care With min Assistance.  Outcome:  Progressing Flowsheets (Taken 01/03/2018 1216) STG: Pt will be able to perform incision/wound care with assistance: 5-Supervision/set up   Problem: RH SAFETY Goal: RH STG ADHERE TO SAFETY PRECAUTIONS W/ASSISTANCE/DEVICE Description STG Adhere to Safety Precautions With min Assistance/Device.  Outcome: Progressing Flowsheets (Taken 01/03/2018 1216) STG:Pt will adhere to safety precautions with assistance/device: 4-Minimal assistance Goal: RH STG DECREASED RISK OF FALL WITH ASSISTANCE Description STG Decreased Risk of Fall With min Assistance.  Outcome: Progressing Flowsheets (Taken 01/03/2018 1216) [id number] risk of fall  with assistance/device: 4-Minimal assistance   Problem: RH COGNITION-NURSING Goal: RH STG USES MEMORY AIDS/STRATEGIES W/ASSIST TO PROBLEM SOLVE Description STG Uses Memory Aids/Strategies With mod Assistance to Problem Solve.  Outcome: Progressing Flowsheets (Taken 01/03/2018 1216) STG: Uses memory aids/strategies with assistance: 4-Minimal assistance Goal: RH STG ANTICIPATES NEEDS/CALLS FOR ASSIST W/ASSIST/CUES Description STG Anticipates Needs/Calls for Assist With mod Assistance/Cues.  Outcome: Progressing Flowsheets (Taken 01/03/2018 1216) STG: Anticipates needs/calls for assistance with assistance/cues: 4-Minimal assistance   Problem: RH PAIN MANAGEMENT Goal: RH STG PAIN MANAGED AT OR BELOW PT'S PAIN GOAL Description Pain <4 with min assistance  Outcome: Progressing   Problem: RH KNOWLEDGE DEFICIT BRAIN INJURY Goal: RH STG INCREASE KNOWLEDGE OF SELF CARE AFTER BRAIN INJURY Description Patient able to verbalize self care activities after brain injury with mod assistance  Outcome: Progressing

## 2018-01-03 NOTE — Progress Notes (Signed)
Occupational Therapy Session Note  Patient Details  Name: [facility] MRN: [id number] Date of Birth: May 05, 1946  Today's Date: 01/03/2018 OT Individual Time: 4403-4742 OT Individual Time Calculation (min): 69 min    Short Term Goals: Week 2:  OT Short Term Goal 1 (Week 2): Pt will transfer to BSC/ toilet with mod A +1  OT Short Term Goal 2 (Week 2): Pt will perform sit to stands in prep for clothing mangement with mod A  OT Short Term Goal 3 (Week 2): Pt will don B socks with supervision from seated level OT Short Term Goal 4 (Week 2): Pt will perform functional task in standing using RW for 1 minute   Skilled Therapeutic Interventions/Progress Updates:    Upon entering the room, pt supine in bed with no c/o pain this session. Pt performed supine >sit with min A to EOB. Pt seated and eating breakfast with close supervision for balance. Pt performed stand pivot transfer with mod lifting assistance for stand pivot transfer to R into wheelchair. Pt seated in wheelchair at sink for bathing and grooming tasks. Pt standing at sink with min A overall for 5 minutes while washing buttocks and peri area. Pt required encouragement to attempt standing task without use of STEDY but was agreeable. Pt returned to wheelchair at end of session with chair alarm activated and quick release belt donned.   Therapy Documentation Precautions:  Precautions Precautions: Fall Precaution Comments: weak left knee (had problems with it before) Restrictions Weight Bearing Restrictions: No   Pain: Pain Assessment Pain Scale: 0-10 Pain Score: 6  Pain Type: Chronic pain Pain Location: Leg Pain Orientation: Left Pain Descriptors / Indicators: Aching Pain Intervention(s): Medication (See eMAR) ADL: ADL ADL Comments: see functional navigator  See Function Navigator for Current Functional Status.   Therapy/Group: Individual Therapy  Gypsy Decant 01/03/2018, 10:03 AM

## 2018-01-03 NOTE — NC FL2 (Signed)
[place] LEVEL OF CARE SCREENING TOOL     IDENTIFICATION  Patient Name: [facility] Birthdate: 1946/07/07 Sex: male Admission Date (Current Location): 12/16/2017  [facility] and [place] Number:  Herbalist and Address:  The [place], [place] [address], [place], [place] [address]      Provider Number: [id number]  Attending Physician Name and Address:  [name], MD  Relative Name and Phone Number:       Current Level of Care: Other (Comment)(Acute Inpatient Rehab) Recommended Level of Care: [place] Prior Approval Number:    Date Approved/Denied:   PASRR Number: [id number]  Discharge Plan: SNF    Current Diagnoses: Patient Active Problem List   Diagnosis Date Noted  . Labile blood pressure   . Essential hypertension   . Acute on chronic diastolic heart failure ([place])   . Type 2 diabetes mellitus with peripheral neuropathy ([name])   . Acute deep vein thrombosis (DVT) of femoral vein of right lower extremity ([place])   . Traumatic subdural hematoma ([place]) 12/16/2017  . ESRD (end stage renal disease) ([place])   . ESRD (end stage renal disease) on dialysis ([place])   . Dysphagia   . Acute blood loss anemia   . Anemia of chronic disease   . Chronic diastolic congestive heart failure ([place])   . Diabetes mellitus type 2 in nonobese ([name])   . Benign essential HTN   . Nonischemic cardiomyopathy ([place])   . Transaminitis   . Encounter for central line placement   . Endotracheally intubated   . SDH (subdural hematoma) ([place])   . Ventilator dependent ([place])   . Chronic renal impairment   . Chronic in-center hemodialysis status ([place])   . S/P craniotomy 12/05/2017  . Hyperkalemia 12/02/2017  . Malnutrition of moderate degree 12/18/2016  . PVC's (premature ventricular contractions)   . Anemia associated with chronic renal failure   . End-stage renal disease on hemodialysis ([place]) 12/09/2016  . Dyslipidemia associated  with type 2 diabetes mellitus ([place]) 12/09/2016  . Acute on chronic systolic and diastolic heart failure, NYHA class 1 ([place]) 12/09/2016  . 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 Type 2 diabetes mellitus ([place]) 12/11/2015  . Fluid overload 12/06/2015  . History of DVT (deep vein thrombosis) 12/04/2015  . Renal hematoma 12/04/2015  . H/O: gout 11/16/2013  . Trochanteric bursitis 10/17/2013  . 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 Lumbar degenerative disc disease 07/17/2013    Orientation RESPIRATION BLADDER Height & Weight     Self, Place, Situation, Time  Normal Indwelling catheter(Chronic foley and ESRD) Weight: 93.7 kg (206 lb 9.1 oz) Height:     BEHAVIORAL SYMPTOMS/MOOD NEUROLOGICAL BOWEL NUTRITION STATUS      Continent Diet(regular, thin)  AMBULATORY STATUS COMMUNICATION OF NEEDS Skin   Extensive Assist Verbally Normal                       Personal Care Assistance Level of Assistance  Bathing, Feeding, Dressing Bathing Assistance: Limited assistance Feeding assistance: Limited assistance Dressing Assistance: Limited assistance     Functional Limitations Info             SPECIAL CARE FACTORS FREQUENCY  PT (By licensed PT), OT (By licensed OT)     PT Frequency: 5x/wk OT Frequency: 5x/wk            Contractures Contractures Info: Not present  Additional Factors Info  Code Status, Allergies, Insulin Sliding Scale Code Status Info: full Allergies Info: see d/c summary   Insulin Sliding Scale Info: see d/c summary       Current Medications (01/03/2018):  This is the current hospital active medication list Current Facility-Administered Medications  Medication Dose Route Frequency Provider Last Rate Last Dose  . acetaminophen (TYLENOL) tablet 650 mg  650 mg Oral Q6H PRN Cathlyn Parsons, PA-C   650 mg at 12/20/17 1150  . albuterol (PROVENTIL) (2.5 MG/3ML) 0.083% nebulizer solution 2.5 mg  2.5 mg  Nebulization Q4H PRN Angiulli, Lavon Paganini, PA-C      . allopurinol (ZYLOPRIM) tablet 300 mg  300 mg Oral Daily Cathlyn Parsons, PA-C   300 mg at 01/03/18 0846  . amiodarone (PACERONE) tablet 200 mg  200 mg Oral Daily Cathlyn Parsons, PA-C   200 mg at 01/03/18 0846  . bisacodyl (DULCOLAX) EC tablet 10 mg  10 mg Oral Daily PRN Cathlyn Parsons, PA-C   10 mg at 01/03/18 0249  . bisacodyl (DULCOLAX) EC tablet 5 mg  5 mg Oral QHS AngiulliLavon Paganini, PA-C   5 mg at 01/01/18 2244  . bisacodyl (DULCOLAX) suppository 10 mg  10 mg Rectal Daily PRN Jamse Arn, MD   10 mg at 12/21/17 0250  . calcitRIOL (ROCALTROL) capsule 0.5 mcg  0.5 mcg Oral Q T,Th,Sa-HD Cathlyn Parsons, PA-C   0.5 mcg at 12/30/17 1139  . Darbepoetin Alfa (ARANESP) injection 150 mcg  150 mcg Intravenous Q Sat-HD Roney Jaffe, MD   150 mcg at 01/01/18 2100  . diclofenac sodium (VOLTAREN) 1 % transdermal gel 2 g  2 g Topical TID [name], MD   2 g at 01/03/18 0850  . diphenhydrAMINE (BENADRYL) capsule 25 mg  25 mg Oral Q6H PRN Jamse Arn, MD   25 mg at 01/01/18 0319  . feeding supplement (PRO-STAT SUGAR FREE 64) liquid 30 mL  30 mL Oral BID [name], MD   30 mL at 01/03/18 0845  . heparin injection 1,000 Units  1,000 Units Dialysis PRN AngiulliLavon Paganini, PA-C      . insulin aspart (novoLOG) injection 0-9 Units  0-9 Units Subcutaneous TID WC Cathlyn Parsons, PA-C   2 Units at 12/31/17 1249  . multivitamin (RENA-VIT) tablet 1 tablet  1 tablet Oral QHS Cathlyn Parsons, PA-C   1 tablet at 01/02/18 2050  . mupirocin ointment (BACTROBAN) 2 %   Nasal BID Angiulli, Lavon Paganini, PA-C      . MUSCLE RUB CREA   Topical PRN Cathlyn Parsons, PA-C      . ondansetron [facility]) tablet 4 mg  4 mg Oral Q6H PRN Cathlyn Parsons, PA-C   4 mg at 01/02/18 1228   Or  . ondansetron (ZOFRAN) injection 4 mg  4 mg Intravenous Q6H PRN Cathlyn Parsons, PA-C   4 mg at 12/25/17 1033  . oxybutynin (DITROPAN) 5 MG/5ML  syrup 5 mg  5 mg Oral BID Cathlyn Parsons, PA-C   5 mg at 01/03/18 0846  . oxyCODONE-acetaminophen (PERCOCET) 7.5-325 MG per tablet 1 tablet  1 tablet Oral Q6H PRN Cathlyn Parsons, PA-C   1 tablet at 01/03/18 0855  . pantoprazole (PROTONIX) EC tablet 40 mg  40 mg Oral QHS [name], MD   40 mg at 01/02/18 2050  . polyethylene glycol (MIRALAX / GLYCOLAX) packet 17 g  17 g Oral Daily  Cathlyn Parsons, PA-C   17 g at 01/03/18 0174  . sevelamer carbonate (RENVELA) tablet 1,600 mg  1,600 mg Oral TID WC Roney Jaffe, MD   1,600 mg at 01/03/18 0845  . sodium chloride (OCEAN) 0.65 % nasal spray 1 spray  1 spray Each Nare PRN Rowe Clack, MD   1 spray at 01/01/18 1139  . sodium chloride flush (NS) 0.9 % injection 10-40 mL  10-40 mL Intracatheter Q12H [name], MD   10 mL at 01/02/18 2036  . sodium chloride flush (NS) 0.9 % injection 10-40 mL  10-40 mL Intracatheter PRN [name], MD      . sorbitol 70 % solution 30 mL  30 mL Oral Daily PRN Cathlyn Parsons, PA-C   30 mL at 12/19/17 1543     Discharge Medications: Please see discharge summary for a list of discharge medications.  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 and would need transportation.  Trimaine Maser, LCSW

## 2018-01-03 NOTE — Progress Notes (Signed)
[name] Progress Note   Subjective:  Seen in room, no complaints. Sleepy this AM  Objective Vitals:   01/01/18 2117 01/02/18 0301 01/02/18 1449 01/03/18 0126  BP:  135/73 114/65 (!) 145/94  Pulse:  93 86 89  Resp:  17 17 16   Temp:  99.1 F (37.3 C) 98.7 F (37.1 C) 98.3 F (36.8 C)  TempSrc:  Oral Oral Oral  SpO2:  99% 100% 98%  Weight: 92.1 kg (203 lb 0.7 oz) 90.3 kg (199 lb 1.2 oz)  93.7 kg (206 lb 9.1 oz)   Physical Exam General: Well appearing, NAD Heart: RRR; no murmur Lungs: CTA in upper lobes, faint crackles in B bases Extremities: 1-2+ LE edema (improving) Dialysis Access: L TDC and L AVG + bruit, LUE swelling, no RUE swelling  Additional Objective Labs: Basic Metabolic Panel: Recent Labs  Lab 12/28/17 1358 12/31/17 1908 01/01/18 1742 01/03/18 0934  NA 131* 127* 125* 125*  K 4.8 5.2* 5.6* 4.8  CL 92* 89* 88* 88*  CO2 25 25 23 23   GLUCOSE 167* 95 65 118*  BUN 53* 71* 79* 53*  CREATININE 7.52* 8.63* 9.54* 7.58*  CALCIUM 7.8* 8.2* 8.2* 8.4*  PHOS 4.3 4.2 5.0*  --    Liver Function Tests: Recent Labs  Lab 12/28/17 1358 12/31/17 1908 01/01/18 1742  ALBUMIN 2.6* 2.6* 2.6*   CBC: Recent Labs  Lab 12/28/17 1358 12/31/17 1908 01/01/18 1724  WBC 5.5 6.6 5.8  HGB 8.3* 8.8* 8.8*  HCT 26.2* 26.9* 26.7*  MCV 95.6 95.1 93.7  PLT 177 213 207   CBG: Recent Labs  Lab 01/02/18 1643 01/02/18 1852 01/02/18 2129 01/03/18 0301 01/03/18 0618  GLUCAP 64* 82 105* 100* 104*   Medications:  . allopurinol  300 mg Oral Daily  . amiodarone  200 mg Oral Daily  . bisacodyl  5 mg Oral QHS  . calcitRIOL  0.5 mcg Oral Q T,Th,Sa-HD  . darbepoetin (ARANESP) injection - DIALYSIS  150 mcg Intravenous Q Sat-HD  . diclofenac sodium  2 g Topical TID  . feeding supplement (PRO-STAT SUGAR FREE 64)  30 mL Oral BID  . insulin aspart  0-9 Units Subcutaneous TID WC  . multivitamin  1 tablet Oral QHS  . mupirocin ointment   Nasal BID  . oxybutynin  5  mg Oral BID  . pantoprazole  40 mg Oral QHS  . polyethylene glycol  17 g Oral Daily  . sevelamer carbonate  1,600 mg Oral TID WC  . sodium chloride flush  10-40 mL Intracatheter Q12H   Dialysis Orders: TTS [facility] 4h 88.5kg L IJ TDC / new LUA AVGG ([facility], 3/6) Hep none (after SDH) Venofer 50mg  IV qHD Mircera 146mcg IV q2wks - last 2/14 Calcitriol 3.40mcg PO qHD  Sensipar 30mg  qd Renvela 800mg - 2AC TID, 2 w/snacks   HD THS schedule using TDC: 3K, 4h, target 3L UF BP permitting, no heparin (SDH)  Assessment/Plan: 1. S/p Fall and SDH with craniotomy 2/24, now in CIR. 2. ESRD: Continue HD per TTS. No heparind/t SDH.  Will hopefully remove more UF as hyponatremic this AM 3. Vascular access: LIJ TDC/LUE AVG placed 3/6, swelling improving. 4. Anemia: Hgb 8.8, continue Aranesp 150 qSat  5. 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 Continue VDRA/Renvela  6. HTN/volume - BP controlled/volume stable  7. Nutrition - Renal diet/viatins  8. LE DVT s/p IVC filter 3/9 9. DM2  10. Chronic indwelling foley hx bladder cancer     [facility] pgr [phone] 01/03/2018, 11:48 AM

## 2018-01-04 ENCOUNTER — Inpatient Hospital Stay (HOSPITAL_COMMUNITY): Payer: Medicare Other

## 2018-01-04 ENCOUNTER — Inpatient Hospital Stay (HOSPITAL_COMMUNITY): Payer: Medicare Other | Admitting: Speech Pathology

## 2018-01-04 ENCOUNTER — Inpatient Hospital Stay (HOSPITAL_COMMUNITY): Payer: Medicare Other | Admitting: Physical Therapy

## 2018-01-04 LAB — RENAL FUNCTION PANEL
ALBUMIN: 3 g/dL — AB (ref 3.5–5.0)
ANION GAP: 14 (ref 5–15)
BUN: 63 mg/dL — ABNORMAL HIGH (ref 6–20)
CO2: 24 mmol/L (ref 22–32)
Calcium: 8.6 mg/dL — ABNORMAL LOW (ref 8.9–10.3)
Chloride: 86 mmol/L — ABNORMAL LOW (ref 101–111)
Creatinine, Ser: 8.79 mg/dL — ABNORMAL HIGH (ref 0.61–1.24)
GFR, EST AFRICAN AMERICAN: 6 mL/min — AB (ref >60)
GFR, EST NON AFRICAN AMERICAN: 5 mL/min — AB (ref >60)
Glucose, Bld: 92 mg/dL (ref 65–99)
PHOSPHORUS: 5.3 mg/dL — AB (ref 2.5–4.6)
Potassium: 5.3 mmol/L — ABNORMAL HIGH (ref 3.5–5.1)
SODIUM: 124 mmol/L — AB (ref 135–145)

## 2018-01-04 LAB — CBC
HCT: 25.7 % — ABNORMAL LOW (ref 39.0–52.0)
Hemoglobin: 8.6 g/dL — ABNORMAL LOW (ref 13.0–17.0)
MCH: 31.5 pg (ref 26.0–34.0)
MCHC: 33.5 g/dL (ref 30.0–36.0)
MCV: 94.1 fL (ref 78.0–100.0)
Platelets: 180 10*3/uL (ref 150–400)
RBC: 2.73 MIL/uL — AB (ref 4.22–5.81)
RDW: 17.5 % — ABNORMAL HIGH (ref 11.5–15.5)
WBC: 5.8 10*3/uL (ref 4.0–10.5)

## 2018-01-04 LAB — GLUCOSE, CAPILLARY
GLUCOSE-CAPILLARY: 136 mg/dL — AB (ref 65–99)
GLUCOSE-CAPILLARY: 86 mg/dL (ref 65–99)
GLUCOSE-CAPILLARY: 126 mg/dL — AB (ref 65–99)
Glucose-Capillary: 189 mg/dL — ABNORMAL HIGH (ref 65–99)
Glucose-Capillary: 116 mg/dL — ABNORMAL HIGH (ref 65–99)

## 2018-01-04 MED ORDER — OXYCODONE-ACETAMINOPHEN 7.5-325 MG PO TABS: 1.0000 | ORAL_TABLET | Freq: Four times a day (QID) | ORAL | 0 refills | Status: DC | PRN

## 2018-01-04 MED ORDER — OXYBUTYNIN CHLORIDE 5 MG/5ML PO SYRP: 5.0000 mg | ORAL_SOLUTION | Freq: Two times a day (BID) | ORAL | 0 refills | Status: DC

## 2018-01-04 MED ORDER — SORBITOL 70 % SOLN
60.0000 mL | Status: AC
Start: 2018-01-04 — End: 2018-01-04
  Administered 2018-01-04: 60 mL via ORAL

## 2018-01-04 MED ORDER — CALCITRIOL 0.5 MCG PO CAPS: 0.5000 ug | ORAL_CAPSULE | ORAL | 1 refills | Status: DC

## 2018-01-04 NOTE — Progress Notes (Signed)
Occupational Therapy Session Note  Patient Details  Name: Albert [facility] MRN: [id number] Date of Birth: December 10, 1945  Today's Date: 01/04/2018 OT Individual Time: 2707-8675 OT Individual Time Calculation (min): 56 min    Short Term Goals: Week 3:  OT Short Term Goal 1 (Week 3): STGs=LTGs due to ELOS  Skilled Therapeutic Interventions/Progress Updates:    Pt received on toilet with nursing staff, with stedy positioned. Pt transferred to standing in stedy with min A provided posteriorly. Pt completed standing level peri-care, with vc provided re UE placement to maximize standing balance. Pt transferred to w/c and performed oral care at sink level with set up. Pt was brought down to therapy gym for a focus on B UE strength and functional activity tolerance, as well as dynamic sitting balance and functional reaching. Pt transferred to therapy mat with slide board, requiring steadying assist and extensive vc/education re positioning of transfer and UE placement. Pt demonstrated poor problem solving through transfer despite multiple previous education attempts. Pt completed 2 sets of 10 each theraband tricep extension and scapular retraction. Pt educated re purpose of theraband exercsies and HEP with theraband, including how to grade exercises up and down. Pt then completed functional posterior reaching with an emphasis on trunk rotation to promote independence with posterior peri-hygiene and dynamic sitting balance. Manual cues provided intermittently at pt's core to promote active abdominal engagement. Pt returned to room with quick release belt donned, chair alarm activated and all needs met.   Therapy Documentation Precautions:  Precautions Precautions: Fall Precaution Comments: weak left knee (had problems with it before) Restrictions Weight Bearing Restrictions: No    Pain: Pain Assessment Pain Scale: 0-10 Pain Score: 7  Faces Pain Scale: Hurts a little bit Pain Type: Chronic pain Pain  Location: Knee Pain Orientation: Left Pain Descriptors / Indicators: Aching Patients Stated Pain Goal: 4 Pain Intervention(s): Repositioned ADL: ADL ADL Comments: see functional navigator  See Function Navigator for Current Functional Status.   Therapy/Group: Individual Therapy  Curtis Sites 01/04/2018, 10:12 AM

## 2018-01-04 NOTE — Progress Notes (Signed)
Physical Therapy Session Note  Patient Details  Name: Albert [facility] MRN: [id number] Date of Birth: 1946/06/01  Today's Date: 01/04/2018 PT Individual Time: 0800-0855 PT Individual Time Calculation (min): 55 min   Short Term Goals: Week 3:  PT Short Term Goal 1 (Week 3): = LTGs; goals downgraded to w/c level for safety overall supervision to min assist  Skilled Therapeutic Interventions/Progress Updates:   Pt sitting EOB and agreeable to therapy, pain 8/10 in BLEs. LPN present providing pain medication. Session focused on functional mobility including w/c mobility and practicing slide board transfers. Transferred to w/c via slide board transfer w/ min assist for scooting and total assist to set-up slide board 2/2 LE weakness. Pt able to boost back in w/c w/ verbal cues for posture only. Practiced 1 slide board transfer to/from mat as well with same level of assist. Encouraged pt to direct his own care, telling therapist where to place board for example in anticipation of d/c to SNF and increased independence. Performed w/c mobility in 100-150' bouts w/ supervision-min assist using BUEs for overall endurance and conditioning. Verbal cues for technique using UEs 2/2 weakness on LUE>RUE. Occasional manual assist for turns. Pillow between LEs for correct LLE alignment in seated. Performed kinetron @ 70 cm/sec 5 min x2 for LE strengthening. Manual assist to maintain neutral LLE alignment on kinetron. Returned to room and ended session in w/c, call bell within reach and all needs met. Chair alarm activated.   Therapy Documentation Precautions:  Precautions Precautions: Fall Precaution Comments: weak left knee (had problems with it before) Restrictions Weight Bearing Restrictions: No Pain: Pain Assessment Pain Scale: 0-10 Pain Score: 8  Pain Type: Chronic pain Pain Location: Leg Pain Orientation: Left;Right Pain Descriptors / Indicators: Aching Patients Stated Pain Goal: 4 Pain  Intervention(s): Medication (See eMAR)  See Function Navigator for Current Functional Status.   Therapy/Group: Individual Therapy  Keryl Gholson K Arnette 01/04/2018, 9:00 AM

## 2018-01-04 NOTE — Progress Notes (Signed)
[facility] Progress Note   Subjective:  Seen in room, no complaints. Getting OT.    Objective Vitals:   01/02/18 1449 01/03/18 0126 01/03/18 1351 01/04/18 0155  BP: 114/65 (!) 145/94 130/74 135/73  Pulse: 86 89 82 92  Resp: 17 16 16 17   Temp: 98.7 F (37.1 C) 98.3 F (36.8 C) 98.5 F (36.9 C)   TempSrc: Oral Oral Oral   SpO2: 100% 98% 100% 100%  Weight:  93.7 kg (206 lb 9.1 oz)     Physical Exam General: Well appearing, NAD Heart: RRR; no murmur Lungs: CTA in upper lobes, faint crackles in B bases Extremities: 1-2+ LE edema (improving) Dialysis Access: L TDC and L AVG + bruit, LUE swelling, no RUE swelling Neuro: AAO x 3, + craniotomy  Additional Objective Labs: Basic Metabolic Panel: Recent Labs  Lab 12/28/17 1358 12/31/17 1908 01/01/18 1742 01/03/18 0934  NA 131* 127* 125* 125*  K 4.8 5.2* 5.6* 4.8  CL 92* 89* 88* 88*  CO2 25 25 23 23   GLUCOSE 167* 95 65 118*  BUN 53* 71* 79* 53*  CREATININE 7.52* 8.63* 9.54* 7.58*  CALCIUM 7.8* 8.2* 8.2* 8.4*  PHOS 4.3 4.2 5.0*  --    Liver Function Tests: Recent Labs  Lab 12/28/17 1358 12/31/17 1908 01/01/18 1742  ALBUMIN 2.6* 2.6* 2.6*   CBC: Recent Labs  Lab 12/28/17 1358 12/31/17 1908 01/01/18 1724  WBC 5.5 6.6 5.8  HGB 8.3* 8.8* 8.8*  HCT 26.2* 26.9* 26.7*  MCV 95.6 95.1 93.7  PLT 177 213 207   CBG: Recent Labs  Lab 01/03/18 1637 01/03/18 2104 01/04/18 0323 01/04/18 0655 01/04/18 1149  GLUCAP 154* 150* 189* 136* 86   Medications:  . allopurinol  300 mg Oral Daily  . amiodarone  200 mg Oral Daily  . bisacodyl  5 mg Oral QHS  . calcitRIOL  0.5 mcg Oral Q T,Th,Sa-HD  . darbepoetin (ARANESP) injection - DIALYSIS  150 mcg Intravenous Q Sat-HD  . diclofenac sodium  2 g Topical TID  . feeding supplement (PRO-STAT SUGAR FREE 64)  30 mL Oral BID  . insulin aspart  0-9 Units Subcutaneous TID WC  . multivitamin  1 tablet Oral QHS  . mupirocin ointment   Nasal BID  . oxybutynin  5  mg Oral BID  . pantoprazole  40 mg Oral QHS  . polyethylene glycol  17 g Oral Daily  . sevelamer carbonate  1,600 mg Oral TID WC  . sodium chloride flush  10-40 mL Intracatheter Q12H   Dialysis Orders: TTS [facility] 4h 88.5kg L IJ TDC / new LUA AVGG ([facility], 3/6) Hep none (after SDH) Venofer 50mg  IV qHD Mircera 149mcg IV q2wks - last 2/14 Calcitriol 3.77mcg PO qHD  Sensipar 30mg  qd Renvela 800mg - 2AC TID, 2 w/snacks   HD THS schedule using TDC: 3K, 4h, target 3L UF BP permitting, no heparin (SDH)  Assessment/Plan: 1. S/p Fall and SDH with craniotomy 2/24, now in CIR. 2. ESRD: Continue HD per TTS. No heparind/t SDH.  Chronic swelling and hyponatremia- hopefully we can achieve some UF.   3. Vascular access: LIJ TDC/LUE AVG placed 3/6, swelling improving. 4. Anemia: Hgb 8.8, continue Aranesp 150 qSat  5. 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 Continue VDRA/Renvela  6. HTN/volume - BP controlled- need a little better UF 7. Nutrition - Renal diet/viatins  8. LE DVT s/p IVC filter 3/9 9. DM2  10. Chronic indwelling foley hx bladder cancer     Madelon Lips  MD [place] pgr [phone] 01/04/2018, 12:30 PM

## 2018-01-04 NOTE — Progress Notes (Signed)
Speech Language Pathology Daily Session Note  Patient Details  Name: TORRY ISTRE MRN: [id number] Date of Birth: [birth year]  Today's Date: 01/04/2018 SLP Individual Time: 0254-2706 SLP Individual Time Calculation (min): 55 min  Short Term Goals: Week 3: SLP Short Term Goal 1 (Week 3): Pt will selectively attend to tasks in a moderately distracting environment for 60 minutes with supervision cues for redirection to task.   SLP Short Term Goal 2 (Week 3): Pt will complete semi-complex tasks with supervision verbal cues for functional problem solving.   SLP Short Term Goal 3 (Week 3): Pt will recognize and correct errors in the moment during structured tasks with supervision verbal cues.    Skilled Therapeutic Interventions: Skilled treatment session focused on cognitive goals. Upon arrival, patient was asleep while upright in the wheelchair. SLP facilitated session by providing Max A verbal cues for arousal/attention for ~2 minute intervals throughout functional tasks due to fatigue and lethargy. Patient requested to use the bathroom and was transferred to the commode via the [place]. Patient requested to stand without assistance present to have a bowel movement, however, RN and clinician educated patient on safety concerns. Patient unable to have a bowel movement andd transferred back to the wheelchair. At end of session, patient transferred back to bed and required Min A verbal cues for problem solving. Patient left supine in bed with alarm on and all needs within reach. Continue with current plan of care.      Function:  Cognition Comprehension Comprehension assist level: Understands complex 90% of the time/cues 10% of the time  Expression   Expression assist level: Expresses complex 90% of the time/cues < 10% of the time  Social Interaction Social Interaction assist level: Interacts appropriately with others with medication or extra time (anti-anxiety, antidepressant).  Problem Solving  Problem solving assist level: Solves basic 50 - 74% of the time/requires cueing 25 - 49% of the time  Memory Memory assist level: Recognizes or recalls 75 - 89% of the time/requires cueing 10 - 24% of the time    Pain Pain Assessment Pain Score: 7  Faces Pain Scale: Hurts a little bit Pain Type: Chronic pain Pain Location: Knee Pain Orientation: Left Pain Descriptors / Indicators: Aching Pain Intervention(s): Repositioned  Therapy/Group: Individual Therapy  Alden Feagan 01/04/2018, 12:32 PM

## 2018-01-04 NOTE — Progress Notes (Signed)
Received SNF bed offer today from [facility] who can admit pt tomorrow.  Pt and wife have accepted bed offer with plan to transfer via ambulance tomorrow.  Have alerted HD unit and [facility] about d/c tomorrow.  Plan to restart outpatient HD at [facility] on Thursday and [facility] will provide that transportation. Tx team aware.  Duyen Beckom, LCSW

## 2018-01-04 NOTE — Discharge Summary (Signed)
Discharge summary job # [id number]

## 2018-01-04 NOTE — Progress Notes (Signed)
Nutrition Follow-up  DOCUMENTATION CODES:   Not applicable  INTERVENTION:  Continue 30 ml Prostat po BID, each supplement provides 100 kcal and 15 grams of protein.   Encourage adequate PO intake.   NUTRITION DIAGNOSIS:   Increased nutrient needs related to chronic illness(ESRD on HD) as evidenced by estimated needs; ongoing  GOAL:   Patient will meet greater than or equal to 90% of their needs; progressing  MONITOR:   PO intake, Supplement acceptance, Weight trends, Labs, Skin, I & O's  REASON FOR ASSESSMENT:   Consult Diet education  ASSESSMENT:   72 y.o. right handed male with history of bladder cancer with resection in 1999 lymphedema, end-stage renal disease with hemodialysis, diastolic congestive heart failure, diabetes mellitus, hypertension, nonischemic cardiomyopathy.  Presented 12/05/2017 with fatigue, vomiting and diarrhea 2 days as well as headache. Cranial CT reviewed showing right SDH. Neurosurgery consulted underwent right frontotemporal parietal craniotomy hematoma evacuation 12/05/2017.   Meal completion has been 25-75%. Pt with constipation. Sorbitol to be given to aid in constipation relief. Pt currently has Prostat ordered and has been consuming them. RD to continue with current orders. Pt encouraged to eat his food at meals and to take his supplements.   Labs and medications reviewed.   Diet Order:  Diet renal/carb modified with fluid restriction Diet-HS Snack? Nothing; Fluid restriction: 1200 mL Fluid; Room service appropriate? Yes; Fluid consistency: Thin  EDUCATION NEEDS:   Education needs have been addressed  Skin:  Skin Assessment: Skin Integrity Issues: Skin Integrity Issues:: Incisions Incisions: head, L arm  Last BM:  3/24  Height:   Ht Readings from Last 1 Encounters:  12/05/17 5\' 9"  (1.753 m)    Weight:   Wt Readings from Last 1 Encounters:  01/03/18 206 lb 9.1 oz (93.7 kg)    Ideal Body Weight:  72.7 kg  BMI:  Body mass  index is 30.51 kg/m.  Estimated Nutritional Needs:   Kcal:  2100-2300  Protein:  110-120 grams  Fluid:  1.2 L/day    Corrin Parker, MS, RD, LDN Pager # [phone] After hours/ weekend pager # [phone]

## 2018-01-04 NOTE — Progress Notes (Signed)
Speech Language Pathology Discharge Summary  Patient Details  Name: DECLYN DELSOL MRN: [id number] Date of Birth: [birth year]  Today's Date: 01/04/2018   Patient has met 6 of 6 long term goals.  Patient to discharge at overall Supervision;Min;Modified Independent level.    Clinical Impression/Discharge Summary: Pt has made functional gains and has met 6 out of 6 LTG's this admission due to improved cognitive and swallowing functioning. Currently, pt demonstrates behaviors consistent with a Rancho level VIII and requires overall Supervision verbal cues for basic problem solving and recall of functional information. Pt demonstrates increased emergent awareness and requires Min A verbal cues to self-monitor and correct errors during functional tasks. Pt is able to selectively attend to tasks for 45 minutes with Mod I. Pt also consumes regular textures with thin liquids without overt s/s of aspiration and demonstrates use of swallowing compensatory strategies with Mod I. Pt's family is unable to provide the necessary assistance needed at this time, therefore, pt will discharge to a SNF. Pt would benefit from f/u SLP services to maximize cognitive function in order to improve his overall functional independence and reduce caregiver burden.      Care Partner:  Caregiver Able to Provide Assistance: No  Type of Caregiver Assistance: Physical;Cognitive  Recommendation:  24 hour supervision/assistance;Skilled Nursing facility  Rationale for SLP Follow Up: Maximize cognitive function and independence;Reduce caregiver burden    Reasons for discharge: Treatment goals met   Patient/Family Agrees with Progress Made and Goals Achieved: Yes    Meredeth Ide  SLP - Student 01/04/2018, 1:48 PM

## 2018-01-04 NOTE — Discharge Summary (Addendum)
NAME:  Albert Stanley, Albert Stanley NO.:  [id number]  MEDICAL RECORD NO.:  [id number]  LOCATION:  HD06C                        FACILITY:  [facility]  PHYSICIAN:  [name], M.D.DATE OF BIRTH:  [birth year]  DATE OF ADMISSION:  12/16/2017 DATE OF DISCHARGE:  01/05/2018                              DISCHARGE SUMMARY   DISCHARGE DIAGNOSES: 1. Traumatic right subdural hematoma, status post right frontotemporal     parietal craniotomy on December 05, 2017. 2. Right femoral and posterior tibial deep venous thrombosis with left     peroneal deep vein thrombosis, status post IVC filter placement on     December 18, 2017. 3. Pain management. 4. End-stage renal disease, on hemodialysis. 5. Diabetes mellitus. 6. Peripheral neuropathy. 7. Acute-on-chronic anemia. 8. Diastolic congestive heart failure with nonischemic cardiomyopathy. 9. Hypertension. 10.Methicillin-resistant Staphylococcus aureus; PCR screening     Positive. 11.  History of bladder cancer with resection in 1999.  Chronic Foley to 12.Constipation.  HISTORY OF PRESENT ILLNESS:  This is a 72 year old right-handed male with history of bladder cancer, resection in 99, end-stage renal disease, on hemodialysis; congestive heart failure, diabetes mellitus, nonischemic cardiomyopathy.  Lives with spouse and daughter; independent with assistive device prior to admission and still driving.  Presented December 05, 2017 with fatigue, vomiting, diarrhea, and headache.  He had missed his last dialysis session noted, when he tripped on the carpet on December 03, 2017.  Denied striking his head.  Cranial CT scan showed right subdural hematoma.  Large right-sided subdural hematoma measuring 16 mm with mass effect.  There was some uncal herniation present.  Neurosurgery was consulted.  Underwent right frontotemporal parietal craniotomy, hematoma evacuation on December 05, 2017 per Dr. Christella Noa.  HOSPITAL COURSE:  Hemodialysis ongoing  with conversion of left temporary dialysis catheter to a tunneled dialysis catheter per Dr. Trula Slade. Intermittent bouts of restlessness, agitation; acute-on-chronic anemia 7.5.  Dysphagia.  Thin liquid diet.  Nasogastric tube for nutritional support. MRSA PCR screening positive with contact precautions.  Physical and occupational therapy ongoing.  The patient was admitted for comprehensive rehab program.  PAST MEDICAL HISTORY:  See discharge diagnoses.  SOCIAL HISTORY:  Lives with spouse and daughter; independent with assistive device prior to admission.  FUNCTIONAL STATUS UPON ADMISSION TO REHAB SERVICES:  A +2 physical assist sit to stand, moderate assist sit to supine, max total assist activities daily living.  PHYSICAL EXAMINATION:  VITAL SIGNS:  Blood pressure 138/82, pulse 77, temperature 98, and respirations 18. GENERAL:  Alert male.  He could provide his name, place, and date of birth.  Craniotomy site clean and dry. HEENT:  EOMs intact. NECK:  Supple.  Nontender.  No JVD. CARDIAC:  Rate controlled. ABDOMEN:  Soft, nontender.  Good bowel sounds. LUNGS:  Clear to auscultation without wheeze.  Right upper extremity 5/5 proximal to distal, left upper extremity 4/5, proximal to distal; right lower extremity 4 to 5 proximal to distal, left lower extremity 2/5 hip flexors, 2+/5 knee extension.  REHABILITATION HOSPITAL COURSE:  The patient was admitted to Inpatient Rehab Services with therapies initiated on a 3-hour daily basis, consisting of physical therapy, occupational therapy, speech therapy, and rehabilitation nursing.  The following issues were addressed  during the patient's rehabilitation stay.  Pertaining to Mr. Grabe traumatic right subdural hematoma.  He had undergone craniotomy on December 05, 2017, follow up Dr. Christella Noa of Neurosurgery as directed.  Venous Doppler study showed right femoral and posterior tibial DVT, left peroneal DVT. He underwent placement of  IVC filter on December 18, 2017, per Interventional Radiology.  Pain management with the use of oxycodone as needed.  He continue with hemodialysis as per Renal Services.  Diabetes mellitus, blood sugars overall controlled.  He had been on low-dose of glucotrol.  He exhibited no other signs of fluid overload.  Blood pressures monitored when out of bed.  Contact precautions for MRSA, PCR screening positive.  Patient with history of bladder cancer with resection in 99 he has a chronic Foley to that is changed routinely.  His diet had been advanced to a regular consistency.  The patient received weekly collaborative interdisciplinary team conferences to discuss estimated length of stay, family teaching, any barriers to his discharge.  Sessions focused on functional mobility including wheelchair mobility, practicing sliding board transfers.  He transferred wheelchair to sliding board with minimal assist for scooting.  Total assist to set up a sliding board. He was able to boost back in the wheelchair with verbal cues.  He needed some encouragement at times to participate.  He could propel his wheelchair up to 150 feet with supervision.  Activities of daily living and homemaking.  Completed standing level pericare with verbal cues. Working with standing balance as well as dynamic sitting balance. Transferred to therapy mat with sliding board.  He demonstrated poor problem solving to transfers despite multiple previous education attempts and needed ongoing cues.  Due to patient's need for physical assistance it was felt skilled nursing facility was needed with bed becoming available on January 05, 2018.  DISCHARGE MEDICATIONS:  Included: 1. Allopurinol 300 mg p.o. daily. 2. Amiodarone 200 mg p.o. daily. 3. Dulcolax tablet 5 mg p.o. at bedtime. 4. Calcitriol 0.5 mcg Tuesday, Thursday, and Saturday dialysis. 5. Aranesp 150 mcg every Saturday with dialysis. 6. Voltaren gel 1% three times a day to  affected area. 7. Rena-Vite one tab p.o. at bedtime. 8. Ditropan 5 mg p.o. b.i.d. 9. Protonix 40 mg p.o. at bedtime. 10.MiraLAX daily hold for loose stool. 11.Renvela 1600 mg p.o. t.i.d. 12.Oxycodone 7.5-325 mg 1 tab with every 6 hours as needed pain. 13.  Glucotrol 2.5 mg twice daily  DIET:  His diet was a carb modified 1200 mL fluid restriction diet.  FOLLOWUP:  The patient would follow up with Dr. Alger Simons at the Outpatient Rehab Service office as directed; Dr. Ashok Pall, Neurosurgery call for appointment; Dr. Elmarie Shiley, Nephrology Services.  SPECIAL INSTRUCTIONS:  Continue dialysis as directed.     Lauraine Rinne, P.A.   ______________________________ [name], M.D.    DA/MEDQ  D:  01/04/2018  T:  01/04/2018  Job:  [id number]  cc:   Ashok Pall, M.D. Ashby Dawes. Polite, M.D. [name], M.D. Elmarie Shiley, MD

## 2018-01-04 NOTE — Progress Notes (Signed)
Physical Therapy Discharge Summary  Patient Details  Name: Albert Stanley MRN: [id number] Date of Birth: Feb 24, 1946  Patient has met 6 of 6 long term goals due to improved activity tolerance, improved balance, improved postural control, increased strength, increased range of motion, decreased pain, ability to compensate for deficits, improved attention, improved awareness and improved coordination.  Patient to discharge at a wheelchair level [place].   Patient's care partner unavailable to provide the necessary physical and cognitive assistance at discharge.   Reasons goals not met: n/a  Recommendation:  Patient will benefit from ongoing skilled PT services in skilled nursing facility setting to continue to advance safe functional mobility, address ongoing impairments in BLE strengthening, functional mobility including transfers and bed mobility, endurance, gait, and balance, and minimize fall risk.  Equipment: No equipment provided  Reasons for discharge: treatment goals met and discharge from hospital  Patient/family agrees with progress made and goals achieved: Yes  PT Discharge Precautions/Restrictions Precautions Precaution Comments: weak left knee (had problems with it before) Restrictions Weight Bearing Restrictions: No Vital Signs Therapy Vitals Temp: 97.8 F (36.6 C) Temp Source: Oral Pulse Rate: 78 Resp: 18 BP: (!) 148/81 Patient Position (if appropriate): Lying Oxygen Therapy SpO2: 96 % O2 Device: Room Air Pain Pain Assessment Pain Scale: 0-10 Pain Score: 0-No pain Vision/Perception  Perception Perception: Within Functional Limits Praxis Praxis: Impaired Praxis Impairment Details: Motor planning  Cognition Overall Cognitive Status: Impaired/Different from baseline Arousal/Alertness: Awake/alert Orientation Level: Oriented X4 Attention: Selective Focused Attention: Appears intact Sustained Attention: Appears intact Sustained Attention Impairment:  Verbal basic;Functional basic Selective Attention: Impaired Selective Attention Impairment: Functional basic;Verbal basic Memory: Impaired Memory Impairment: Decreased recall of new information Awareness: Impaired Awareness Impairment: Emergent impairment Problem Solving: Impaired Problem Solving Impairment: Functional complex;Verbal complex Safety/Judgment: Impaired [place] [employer] Scales of Cognitive Functioning: Purposeful/appropriate Sensation Sensation Light Touch: Not tested Proprioception: Not tested Coordination Gross Motor Movements are Fluid and Coordinated: No Fine Motor Movements are Fluid and Coordinated: Yes Coordination and Movement Description: Impaired coordination of LEs 2/2 increased edema Motor  Motor Motor: Hemiplegia Motor - Skilled Clinical Observations: general weakness  Mobility Bed Mobility Bed Mobility: Rolling Right;Rolling Left;Sit to Supine;Supine to Sit Rolling Right: 4: Min assist Rolling Left: 4: Min assist Supine to Sit: 4: Min assist Sit to Supine: 4: Min assist Transfers Transfers: Yes Sit to Stand: 4: Min assist Stand to Sit: 4: Min assist Lateral/Scoot Transfers: 4: Min assist;With Warden/ranger  Ambulation Ambulation: Yes Ambulation/Gait Assistance: 3: Mod assist Ambulation Distance (Feet): 10 Feet Assistive device: Parallel bars Stairs / Additional Locomotion Stairs: No Architect: Yes Wheelchair Assistance: 5: Careers information officer: Both upper extremities Wheelchair Parts Management: Supervision/cueing Distance: 150'  Trunk/Postural Assessment  Cervical Assessment Cervical Assessment: Within Functional Limits Thoracic Assessment Thoracic Assessment: Exceptions to WFL(kyphotic) Lumbar Assessment Lumbar Assessment: Exceptions to WFL(posterior pelvic tilt) Postural Control Postural Control: Deficits on evaluation Protective Responses: delayed  Balance Balance Balance  Assessed: Yes Static Sitting Balance Static Sitting - Balance Support: Feet supported;No upper extremity supported Static Sitting - Level of Assistance: 5: Stand by assistance Dynamic Sitting Balance Dynamic Sitting - Balance Support: During functional activity;Feet supported Dynamic Sitting - Level of Assistance: 5: Stand by assistance Static Standing Balance Static Standing - Balance Support: During functional activity Static Standing - Level of Assistance: 4: Min assist Dynamic Standing Balance Dynamic Standing - Balance Support: During functional activity Dynamic Standing - Level of Assistance: 4: Min assist Extremity Assessment  RLE Assessment RLE  Assessment: Within Functional Limits LLE Assessment LLE Assessment: Exceptions to WFL(2-3/5 globally, proximal>distal weakness )   See Function Navigator for Current Functional Status.  Mirna Sutcliffe K Arnette 01/04/2018, 5:32 PM

## 2018-01-04 NOTE — Procedures (Signed)
Patient seen and examined on Hemodialysis. QB 400 mL/ min via L TDC UF goal 3L  Wants to cut rx to 2 hr today.  Educated by Conservation officer, historic buildings that underdialysis quite deleterious to health.  Treatment adjusted as needed.  Madelon Lips MD [facility] pgr [phone] 3:53 PM

## 2018-01-04 NOTE — Progress Notes (Signed)
Albert Stanley PHYSICAL MEDICINE & REHABILITATION     PROGRESS NOTE    Subjective/Complaints: Has concerns about constipation.  Again focusing on left arm.  ROS: Patient denies fever, rash, sore throat, blurred vision, nausea, vomiting, diarrhea, cough, shortness of breath or chest pain, joint or back pain, headache, or mood change. .    Objective: Vital Signs: Blood pressure 135/73, pulse 92, temperature 98.5 F (36.9 C), temperature source Oral, resp. rate 17, weight 93.7 kg (206 lb 9.1 oz), SpO2 100 %. No results found. Recent Labs    01/01/18 1724  WBC 5.8  HGB 8.8*  HCT 26.7*  PLT 207   Recent Labs    01/01/18 1742 01/03/18 0934  NA 125* 125*  K 5.6* 4.8  CL 88* 88*  GLUCOSE 65 118*  BUN 79* 53*  CREATININE 9.54* 7.58*  CALCIUM 8.2* 8.4*   CBG (last 3)  Recent Labs    01/03/18 2104 01/04/18 0323 01/04/18 0655  GLUCAP 150* 189* 136*    Wt Readings from Last 3 Encounters:  01/03/18 93.7 kg (206 lb 9.1 oz)  12/16/17 91.5 kg (201 lb 11.5 oz)  12/04/17 91.3 kg (201 lb 4.5 oz)    Physical Exam:  Constitutional: No distress . Vital signs reviewed. HEENT: EOMI, oral membranes moist Cardiovascular: RRR without murmur. No JVD    Respiratory: CTA Bilaterally without wheezes or rales. Normal effort    GI: BS +, non-tender, non-distended    Ext: tr to 1+ edema RLE, 2+ LLE. 1+ LUE--- remains swollenat graft site, minimal tenderness Musc: left hip and knee tenderness, left genu valgus deformity--stable Neurological: He is alert and oriented.   . Improving insight and awareness. STM deficits persists Motor: RUE: 5/5 proximal to distal LUE: 3/5 proximal to 4-/5 distal RLE: 3/5 HF, KE, 4-/5 ADF LLE: HF 2-/5, KE 2+/5, 3- ADF-  Psych: pleasant and cooperative. Skin-wounds all healing without drainage  Assessment/Plan: 1. Functional deficits and left HP secondary to right SDH which require 3+ hours per day of interdisciplinary therapy in a comprehensive inpatient  rehab setting. Physiatrist is providing close team supervision and 24 hour management of active medical problems listed below. Physiatrist and rehab team continue to assess barriers to discharge/monitor patient progress toward functional and medical goals.  Function:  Bathing Bathing position   Position: Wheelchair/chair at sink  Bathing parts Body parts bathed by patient: Right arm, Left arm, Front perineal area, Abdomen, Chest, Buttocks, Right upper leg, Left upper leg Body parts bathed by helper: Back, Left lower leg, Right lower leg  Bathing assist Assist Level: Touching or steadying assistance(Pt > 75%)      Upper Body Dressing/Undressing Upper body dressing   What is the patient wearing?: Hospital gown, Pull over shirt/dress     Pull over shirt/dress - Perfomed by patient: Thread/unthread right sleeve, Thread/unthread left sleeve, Put head through opening, Pull shirt over trunk Pull over shirt/dress - Perfomed by helper: Pull shirt over trunk Button up shirt - Perfomed by patient: Thread/unthread right sleeve, Pull shirt around back Button up shirt - Perfomed by helper: Thread/unthread left sleeve, Button/unbutton shirt    Upper body assist Assist Level: Supervision or verbal cues, Set up   Set up : To obtain clothing/put away  Lower Body Dressing/Undressing Lower body dressing   What is the patient wearing?: Non-skid slipper socks Underwear - Performed by patient: Thread/unthread right underwear leg, Thread/unthread left underwear leg Underwear - Performed by helper: Pull underwear up/down(standing with RW) Pants- Performed by patient: Thread/unthread  left pants leg, Pull pants up/down Pants- Performed by helper: Thread/unthread right pants leg, Thread/unthread left pants leg, Pull pants up/down Non-skid slipper socks- Performed by patient: Don/doff right sock, Don/doff left sock Non-skid slipper socks- Performed by helper: Don/doff right sock, Don/doff left sock   Socks -  Performed by helper: Don/doff right sock, Don/doff left sock           TED Hose - Performed by helper: Don/doff right TED hose, Don/doff left TED hose  Lower body assist Assist for lower body dressing: Touching or steadying assistance (Pt > 75%)      Toileting Toileting Toileting activity did not occur: No continent bowel/bladder event Toileting steps completed by patient: Adjust clothing prior to toileting, Performs perineal hygiene, Adjust clothing after toileting Toileting steps completed by helper: Performs perineal hygiene, Adjust clothing after toileting Toileting Assistive Devices: Other (comment)(stedy)  Toileting assist Assist level: Touching or steadying assistance (Pt.75%)   Transfers Chair/bed transfer   Chair/bed transfer method: Lateral scoot Chair/bed transfer assist level: Touching or steadying assistance (Pt > 75%) Chair/bed transfer assistive device: Sliding board Mechanical lift: Stedy   Locomotion Ambulation Ambulation activity did not occur: Safety/medical concerns   Max distance: 3' Assist level: Moderate assist (Pt 50 - 74%)   Wheelchair   Type: Manual Max wheelchair distance: 100 Assist Level: Supervision or verbal cues  Cognition Comprehension Comprehension assist level: Understands complex 90% of the time/cues 10% of the time  Expression Expression assist level: Expresses complex 90% of the time/cues < 10% of the time  Social Interaction Social Interaction assist level: Interacts appropriately with others with medication or extra time (anti-anxiety, antidepressant).  Problem Solving Problem solving assist level: Solves basic 90% of the time/requires cueing < 10% of the time  Memory Memory assist level: Recognizes or recalls 90% of the time/requires cueing < 10% of the time   Medical Problem List and Plan:  1. Decreased functional mobility with left-sided weakness and dysphagia secondary to traumatic right subdural hematoma status post right  frontotemporal parietal craniotomy 12/05/2017    Cont CIR PT, OT, SLP therapies.    -SNF pending 2. DVT Prophylaxis/Anticoagulation: SCDs.    Dopplers suggesting right femoral and posterior tibial DVT and left peroneal DVT - status post IVC filter placement on 3/9.    -local edema control, elevation, for left arm and LE's.  3. Pain Management: Oxycodone as needed    -appropriate splinting adaptive techniques for left leg   -Voltaren gel for knee ordered  -discussed reasons why we are trying to limit narcs and that he has other options/modaliites for pain relief as well.  4. Mood: improved, no lability 5. Neuropsych: This patient is capable of making decisions on his own behalf.  6. Skin/Wound Care: Routine skin checks    -Scalp staples removed   7. Fluids/Electrolytes/Nutrition: Routine I&O's    -On regular diet and eating well. 8. End-stage renal disease. Continue hemodialysis as per renal services. Status post arteriovenous Gore-Tex graft left arm 12/15/2017  -HD scheduled after therapies to maximize therapy time/performance, T, TH, S -chronic foley -volume mgt per nephrology, local control of edema -will need to take up HD time with HD team 9. Diabetes mellitus and peripheral neuropathy. Latest hemoglobin A1c 6.4. SSI. Check blood sugars before meals and at bedtime    -continue low-dose Glucotrol QD  10. Acute on chronic anemia. Continue Aranesp.      11. 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 Monitor for any signs of fluid overload   Filed  Weights   01/01/18 2117 01/02/18 0301 01/03/18 0126  Weight: 92.1 kg (203 lb 0.7 oz) 90.3 kg (199 lb 1.2 oz) 93.7 kg (206 lb 9.1 oz)    Volume mgt ongoing per renal   -albuterol nebs are available prn for wheezing/sob 12. Hypertension. Monitor with increased mobility    Blood pressure controlled 3/26 13. MRSA PCR screening positive. Contact precautions  14. Dysphagia:  Advanced to regular texture diet on 3/8 15.   Constipation: Sorbitol today   LOS (Days) [place] EVALUATION WAS PERFORMED      [name], MD 01/04/2018 8:46 AM

## 2018-01-05 ENCOUNTER — Inpatient Hospital Stay (HOSPITAL_COMMUNITY): Payer: Medicare Other | Admitting: Speech Pathology

## 2018-01-05 LAB — GLUCOSE, CAPILLARY
Glucose-Capillary: 181 mg/dL — ABNORMAL HIGH (ref 65–99)
Glucose-Capillary: 152 mg/dL — ABNORMAL HIGH (ref 65–99)

## 2018-01-05 NOTE — Progress Notes (Signed)
Social Work  Discharge Note  The overall goal for the admission was met for:   Discharge location: No - plan changed to SNF as wife/ family unable to provide LOC needed.  Length of Stay: Yes - 20 days  Discharge activity level: No - most goals downgraded and pt leaving at mod assist w/c level overall.  Home/community participation: No  Services provided included: MD, RD, PT, OT, SLP, RN, TR, Pharmacy and SW  Financial Services: [facility] Medicare  Follow-up services arranged: Other: SNF at [facility]  Comments (or additional information):  Patient/Family verbalized understanding of follow-up arrangements: Yes  Individual responsible for coordination of the follow-up plan: pt  Confirmed correct DME delivered: NA  Ronny Ruddell

## 2018-01-05 NOTE — Progress Notes (Signed)
Pt a&o x 4. no c/o pain at present. Resting in bed with HOB elevated. 16 fr FC changed with no difficulty.  DC today to SNF. Called report to [facility] [phone]. Family aware of pt transfer. Transport arrived and pt and belongings sent out of facility.

## 2018-01-05 NOTE — Progress Notes (Signed)
[facility] PHYSICAL MEDICINE & REHABILITATION     PROGRESS NOTE    Subjective/Complaints: Patient sleeping soundly when I arrived.  Awoke easily and denied any problems today.  Was not aware that he was going to a nursing facility today  ROS: Patient denies fever, rash, sore throat, blurred vision, nausea, vomiting, diarrhea, cough, shortness of breath or chest pain, joint or back pain, headache, or mood change. .    Objective: Vital Signs: Blood pressure 128/62, pulse 78, temperature 98.4 F (36.9 C), temperature source Oral, resp. rate 16, weight 96 kg (211 lb 10.3 oz), SpO2 97 %. No results found. Recent Labs    01/04/18 1226  WBC 5.8  HGB 8.6*  HCT 25.7*  PLT 180   Recent Labs    01/03/18 0934 01/04/18 1226  NA 125* 124*  K 4.8 5.3*  CL 88* 86*  GLUCOSE 118* 92  BUN 53* 63*  CREATININE 7.58* 8.79*  CALCIUM 8.4* 8.6*   CBG (last 3)  Recent Labs    01/04/18 1832 01/04/18 2111 01/05/18 0659  GLUCAP 116* 126* 181*    Wt Readings from Last 3 Encounters:  01/05/18 96 kg (211 lb 10.3 oz)  12/16/17 91.5 kg (201 lb 11.5 oz)  12/04/17 91.3 kg (201 lb 4.5 oz)    Physical Exam:  Constitutional: No distress . Vital signs reviewed. HEENT: EOMI, oral membranes moist Cardiovascular: RRR without murmur. No JVD    Respiratory: CTA Bilaterally without wheezes or rales. Normal effort    GI: BS +, non-tender, non-distended     Ext: tr to 1+ edema RLE, 2+ LLE. 1+ LUE--- remains swollenat graft site, minimal tenderness--no real changes Musc: left hip and knee tenderness, left genu valgus deformity--stable Neurological: He is alert and oriented.   . Improving insight and awareness.  Short-term memory deficits Motor: RUE: 5/5 proximal to distal LUE: 3/5 proximal to 4-/5 distal RLE: 3/5 HF, KE, 4-/5 ADF LLE: HF 2-/5, KE 2+/5, 3- ADF-motor exam unchanged Psych: pleasant and cooperative. Skin-wounds all healing without drainage  Assessment/Plan: 1. Functional deficits  and left HP secondary to right SDH which require 3+ hours per day of interdisciplinary therapy in a comprehensive inpatient rehab setting. Physiatrist is providing close team supervision and 24 hour management of active medical problems listed below. Physiatrist and rehab team continue to assess barriers to discharge/monitor patient progress toward functional and medical goals.  Function:  Bathing Bathing position   Position: Wheelchair/chair at sink  Bathing parts Body parts bathed by patient: Right arm, Left arm, Front perineal area, Abdomen, Chest, Buttocks, Right upper leg, Left upper leg Body parts bathed by helper: Back, Left lower leg, Right lower leg  Bathing assist Assist Level: Touching or steadying assistance(Pt > 75%)      Upper Body Dressing/Undressing Upper body dressing   What is the patient wearing?: Hospital gown, Pull over shirt/dress     Pull over shirt/dress - Perfomed by patient: Thread/unthread right sleeve, Thread/unthread left sleeve, Put head through opening, Pull shirt over trunk Pull over shirt/dress - Perfomed by helper: Pull shirt over trunk Button up shirt - Perfomed by patient: Thread/unthread right sleeve, Pull shirt around back Button up shirt - Perfomed by helper: Thread/unthread left sleeve, Button/unbutton shirt    Upper body assist Assist Level: Supervision or verbal cues, Set up   Set up : To obtain clothing/put away  Lower Body Dressing/Undressing Lower body dressing   What is the patient wearing?: Non-skid slipper socks Underwear - Performed by patient: Thread/unthread  right underwear leg, Thread/unthread left underwear leg Underwear - Performed by helper: Pull underwear up/down(standing with RW) Pants- Performed by patient: Thread/unthread left pants leg, Pull pants up/down Pants- Performed by helper: Thread/unthread right pants leg, Thread/unthread left pants leg, Pull pants up/down Non-skid slipper socks- Performed by patient: Don/doff  right sock, Don/doff left sock Non-skid slipper socks- Performed by helper: Don/doff right sock, Don/doff left sock   Socks - Performed by helper: Don/doff right sock, Don/doff left sock           TED Hose - Performed by helper: Don/doff right TED hose, Don/doff left TED hose  Lower body assist Assist for lower body dressing: Touching or steadying assistance (Pt > 75%)      Toileting Toileting Toileting activity did not occur: No continent bowel/bladder event Toileting steps completed by patient: Adjust clothing prior to toileting, Performs perineal hygiene, Adjust clothing after toileting Toileting steps completed by helper: Performs perineal hygiene, Adjust clothing after toileting Toileting Assistive Devices: Other (comment)(stedy)  Toileting assist Assist level: Supervision or verbal cues   Transfers Chair/bed transfer   Chair/bed transfer method: Lateral scoot Chair/bed transfer assist level: Touching or steadying assistance (Pt > 75%) Chair/bed transfer assistive device: Sliding board, Armrests, Bedrails Mechanical lift: Stedy   Locomotion Ambulation Ambulation activity did not occur: Safety/medical concerns   Max distance: 10' Assist level: Moderate assist (Pt 50 - 74%)   Wheelchair   Type: Manual Max wheelchair distance: 150' Assist Level: Supervision or verbal cues  Cognition Comprehension Comprehension assist level: Understands complex 90% of the time/cues 10% of the time  Expression Expression assist level: Expresses complex 90% of the time/cues < 10% of the time  Social Interaction Social Interaction assist level: Interacts appropriately with others with medication or extra time (anti-anxiety, antidepressant).  Problem Solving Problem solving assist level: Solves basic 90% of the time/requires cueing < 10% of the time  Memory Memory assist level: Recognizes or recalls 90% of the time/requires cueing < 10% of the time   Medical Problem List and Plan:  1.  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    -SNF placement today   -Follow-up with me in the office in 6-8 weeks time 2. DVT Prophylaxis/Anticoagulation: SCDs.    Dopplers suggesting right femoral and posterior tibial DVT and left peroneal DVT - status post IVC filter placement on 3/9.    -local edema control, elevation, for left arm and LE's.  3. Pain Management: Oxycodone as needed    -appropriate splinting adaptive techniques for left leg   -Voltaren gel for knee ordered 4. Mood: improved, no lability 5. Neuropsych: This patient is capable of making decisions on his own behalf.  6. Skin/Wound Care: Routine skin checks    -Scalp staples removed   7. Fluids/Electrolytes/Nutrition: Routine I&O's    -On regular diet and eating well. 8. End-stage renal disease. Continue hemodialysis as per renal services. Status post arteriovenous Gore-Tex graft left arm 12/15/2017  -HD scheduled after therapies to maximize therapy time/performance, T, TH, S -chronic foley -volume mgt per nephrology, local control of edema -will need to take up HD time with HD team 9. Diabetes mellitus and peripheral neuropathy. Latest hemoglobin A1c 6.4. SSI. Check blood sugars before meals and at bedtime    -  low-dose Glucotrol QD with good results 10. Acute on chronic anemia. Continue Aranesp.  11. 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 Monitor for any signs of fluid overload   Filed Weights   01/04/18 1425 01/04/18 1710 01/05/18 0340  Weight: 98.2 kg (216 lb 7.9 oz) 95.7 kg (210 lb 15.7 oz) 96 kg (211 lb 10.3 oz)    Volume mgt ongoing per renal   -albuterol nebs are available prn for wheezing/sob 12. Hypertension. Monitor with increased mobility    Blood pressure controlled 3/27 13. MRSA PCR screening positive.  Contact precautions  14. Dysphagia:  Advanced to regular texture diet on 3/8 15.  Constipation: Moves bowels twice yesterday   LOS (Days) [place] EVALUATION WAS PERFORMED      [name], MD 01/05/2018 8:36 AM

## 2018-01-05 NOTE — Patient Care Conference (Signed)
Inpatient RehabilitationTeam Conference and Plan of Care Update Date: 01/04/2018   Time: 2:30 PM    Patient Name: Albert Stanley      Medical Record Number: [id number]  Date of Birth: 1946/05/11 Sex: Male         Room/Bed: 4W07C/4W07C-01 Payor Info: Payor: [employer] park [employer] MEDICARE / Plan: UHC MEDICARE / Product Type: *No Product type* /    Admitting Diagnosis: TBI  Admit Date/Time:  12/16/2017  5:55 PM Admission Comments: No comment available   Primary Diagnosis:  Traumatic subdural hematoma ([name]) Principal Problem: Traumatic subdural hematoma [facility])  Patient Active Problem List   Diagnosis Date Noted  . Labile blood pressure   . Essential hypertension   . Acute on chronic diastolic heart failure ([place])   . Type 2 diabetes mellitus with peripheral neuropathy ([name])   . Acute deep vein thrombosis (DVT) of femoral vein of right lower extremity ([place])   . Traumatic subdural hematoma ([place]) 12/16/2017  . ESRD (end stage renal disease) ([place])   . ESRD (end stage renal disease) on dialysis ([place])   . Dysphagia   . Acute blood loss anemia   . Anemia of chronic disease   . Chronic diastolic congestive heart failure ([place])   . Diabetes mellitus type 2 in nonobese ([name])   . Benign essential HTN   . Nonischemic cardiomyopathy ([place])   . Transaminitis   . Encounter for central line placement   . Endotracheally intubated   . SDH (subdural hematoma) ([place])   . Ventilator dependent ([place])   . Chronic renal impairment   . Chronic in-center hemodialysis status ([place])   . S/P craniotomy 12/05/2017  . Hyperkalemia 12/02/2017  . Malnutrition of moderate degree 12/18/2016  . PVC's (premature ventricular contractions)   . Anemia associated with chronic renal failure   . End-stage renal disease on hemodialysis ([place]) 12/09/2016  . Dyslipidemia associated with type 2 diabetes mellitus ([place]) 12/09/2016  . Acute on chronic systolic and diastolic heart failure, NYHA class 1 ([place]) 12/09/2016  . Diabetes  mellitus with diabetic nephropathy without long-term current use of insulin ([place]) 12/09/2016  . Type 2 diabetes mellitus ([place]) 12/11/2015  . Fluid overload 12/06/2015  . History of DVT (deep vein thrombosis) 12/04/2015  . Renal hematoma 12/04/2015  . H/O: gout 11/16/2013  . Trochanteric bursitis 10/17/2013  . 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 Lumbar degenerative disc disease 07/17/2013    Expected Discharge Date: Expected Discharge Date: 01/05/18  Team Members Present: Physician leading conference: Dr. Alger Simons Social Worker Present: Lennart Pall, LCSW Nurse Present: Junius Creamer, RN PT Present: Roderic Ovens, PT OT Present: Willeen Cass, OT SLP Present: Weston Anna, SLP PPS Coordinator present : Daiva Nakayama, RN, CRRN     Current Status/Progress Goal Weekly Team Focus  Medical   Ongoing edema left arm and leg.  Patient perseverates on.  Nephrology following for hemodialysis.  Pain remains an issue left hip and knee  Manage volume minimize pain  Daily management of medical problems as noted in chart   Bowel/Bladder   Chronic foley w care,  last BM   establish an effective bowel pattern, remain continent of bowel/continue foley  Asses qShift and PRN    Swallow/Nutrition/ Hydration             ADL's   min A bathing,sit <>stand with mod lifting assistance and use of RW, UB self care with min A, LB self care with mod A  supervision - min A overall,   strengthening,  balance, functional transfers, endurance, ADL retraining, safety awareness, pt education   Mobility   sliding board transfer min A  supervision to min assist w/c level; d/c stair goals and max assist gait goal   d/c planning   Communication             Safety/Cognition/ Behavioral Observations  Min A  Supervision  problem solving, recall and awareness    Pain   occ pain 7/10 well managed wcurrent prn medication regimine  continue to effectively control  pain W prescribed pain meds  asses pain Q shift and prn to  assist in pain mgmnt. utilize prescribed medications to control pain symptoms   Skin      Maintain skin integrity, no new skin injuries or abrasions, tears, etc  assess q Shift and PRN for S/S of infection      *See Care Plan and progress notes for long and short-term goals.     Barriers to Discharge  Current Status/Progress Possible Resolutions Date Resolved   Physician    Decreased caregiver support        Ongoing medical management as above      Nursing                  PT                    OT                  SLP                SW                Discharge Planning/Teaching Needs:  Plan changed to SNF placement and have bed offer to admit tomorrow.  NA   Team Discussion:  SNF transfer tomorrow.  Team/ MD aware.  Revisions to Treatment Plan:  NA    Continued Need for Acute Rehabilitation Level of Care: The patient requires daily medical management by a physician with specialized training in physical medicine and rehabilitation for the following conditions: Daily direction of a multidisciplinary physical rehabilitation program to ensure safe treatment while eliciting the highest outcome that is of practical value to the patient.: Yes Daily medical management of patient stability for increased activity during participation in an intensive rehabilitation regime.: Yes Daily analysis of laboratory values and/or radiology reports with any subsequent need for medication adjustment of medical intervention for : Neurological problems;Renal problems  Albert Stanley 01/05/2018, 9:17 AM

## 2018-01-05 NOTE — Progress Notes (Signed)
[facility] Progress Note   Subjective:  Sleeping, easily arousable, lying flat.  Lots of crackers and drinks at bedside.  Objective Vitals:   01/04/18 1630 01/04/18 1700 01/04/18 1710 01/05/18 0340  BP: 128/62 130/60 (!) 148/81 128/62  Pulse: 86 85 78 78  Resp: 19 18 18 16   Temp:   97.8 F (36.6 C) 98.4 F (36.9 C)  TempSrc:   Oral Oral  SpO2:   96% 97%  Weight:   95.7 kg (210 lb 15.7 oz) 96 kg (211 lb 10.3 oz)   Physical Exam General: Well appearing, NAD Heart: RRR; no murmur Lungs: CTA in upper lobes, faint crackles in B bases Extremities: 1-2+ LE edema (improving) Dialysis Access: L TDC and L AVG + bruit, LUE swelling, no RUE swelling Neuro: AAO x 3, + craniotomy  Additional Objective Labs: Basic Metabolic Panel: Recent Labs  Lab 12/31/17 1908 01/01/18 1742 01/03/18 0934 01/04/18 1226  NA 127* 125* 125* 124*  K 5.2* 5.6* 4.8 5.3*  CL 89* 88* 88* 86*  CO2 25 23 23 24   GLUCOSE 95 65 118* 92  BUN 71* 79* 53* 63*  CREATININE 8.63* 9.54* 7.58* 8.79*  CALCIUM 8.2* 8.2* 8.4* 8.6*  PHOS 4.2 5.0*  --  5.3*   Liver Function Tests: Recent Labs  Lab 12/31/17 1908 01/01/18 1742 01/04/18 1226  ALBUMIN 2.6* 2.6* 3.0*   CBC: Recent Labs  Lab 12/31/17 1908 01/01/18 1724 01/04/18 1226  WBC 6.6 5.8 5.8  HGB 8.8* 8.8* 8.6*  HCT 26.9* 26.7* 25.7*  MCV 95.1 93.7 94.1  PLT 213 207 180   CBG: Recent Labs  Lab 01/04/18 0655 01/04/18 1149 01/04/18 1832 01/04/18 2111 01/05/18 0659  GLUCAP 136* 86 116* 126* 181*   Medications:  . allopurinol  300 mg Oral Daily  . amiodarone  200 mg Oral Daily  . bisacodyl  5 mg Oral QHS  . calcitRIOL  0.5 mcg Oral Q T,Th,Sa-HD  . darbepoetin (ARANESP) injection - DIALYSIS  150 mcg Intravenous Q Sat-HD  . diclofenac sodium  2 g Topical TID  . feeding supplement (PRO-STAT SUGAR FREE 64)  30 mL Oral BID  . insulin aspart  0-9 Units Subcutaneous TID WC  . multivitamin  1 tablet Oral QHS  . mupirocin ointment    Nasal BID  . oxybutynin  5 mg Oral BID  . pantoprazole  40 mg Oral QHS  . polyethylene glycol  17 g Oral Daily  . sevelamer carbonate  1,600 mg Oral TID WC  . sodium chloride flush  10-40 mL Intracatheter Q12H   Dialysis Orders: TTS [facility] 4h 88.5kg L IJ TDC / new LUA AVGG ([place], 3/6) Hep none (after SDH) Venofer 50mg  IV qHD Mircera 137mcg IV q2wks - last 2/14 Calcitriol 3.43mcg PO qHD  Sensipar 30mg  qd Renvela 800mg - 2AC TID, 2 w/snacks   HD THS schedule using TDC: 3K, 4h, target 3L UF BP permitting, no heparin (SDH)  Assessment/Plan: 1. S/p Fall and SDH with craniotomy 2/24, now in CIR. 2. ESRD: Continue HD per TTS. No heparind/t SDH.  Hypotension on HD limits UF- still a little up from previous EDW, continue to chip away as OP, could consider increasing rx time by 15 min as OP (especially since on a catheter now) to help achieve more UF.  Was previously 3K as OP, will likely need 2K. 3. Vascular access: LIJ TDC/LUE AVG placed 3/6, swelling improving. 4. Anemia: Hgb 8.8, continue Aranesp 150 qSat  5. 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 Continue VDRA/Renvela  6. HTN/volume - BP controlled- 7. Nutrition - Renal diet/viatins  8. LE DVT s/p IVC filter 3/9 9. DM2  10. Chronic indwelling foley hx bladder cancer     Madelon Lips MD [facility] pgr [phone] 01/05/2018, 11:30 AM

## 2018-01-06 ENCOUNTER — Encounter: Payer: Self-pay | Admitting: Internal Medicine

## 2018-01-06 ENCOUNTER — Non-Acute Institutional Stay (SKILLED_NURSING_FACILITY): Payer: Medicare Other | Admitting: Internal Medicine

## 2018-01-06 DIAGNOSIS — S065X1S Traumatic subdural hemorrhage with loss of consciousness of 30 minutes or less, sequela: Secondary | ICD-10-CM

## 2018-01-06 DIAGNOSIS — R0902 Hypoxemia: Secondary | ICD-10-CM

## 2018-01-06 DIAGNOSIS — R42 Dizziness and giddiness: Secondary | ICD-10-CM

## 2018-01-06 DIAGNOSIS — R319 Hematuria, unspecified: Secondary | ICD-10-CM | POA: Insufficient documentation

## 2018-01-06 DIAGNOSIS — R310 Gross hematuria: Secondary | ICD-10-CM
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
## 2018-01-06 NOTE — Assessment & Plan Note (Signed)
Irrigation when necessary
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
## 2018-01-06 NOTE — Assessment & Plan Note (Signed)
PT/OT rehabilitation at [name]

## 2018-01-06 NOTE — Patient Instructions (Signed)
See assessment and plan under each diagnosis in the problem list and acutely for this visit 

## 2018-01-07 ENCOUNTER — Encounter: Payer: Self-pay | Admitting: Internal Medicine

## 2018-01-07 ENCOUNTER — Other Ambulatory Visit: Payer: Self-pay

## 2018-01-07 MED ORDER — OXYCODONE-ACETAMINOPHEN 7.5-325 MG PO TABS: 1.0000 | ORAL_TABLET | Freq: Four times a day (QID) | ORAL | 0 refills | Status: DC | PRN

## 2018-01-07 NOTE — Progress Notes (Signed)
This encounter was created in error - please disregard.

## 2018-01-07 NOTE — Patient Instructions (Signed)
See assessment and plan under each diagnosis in the problem list and acutely for this visit 

## 2018-01-07 NOTE — Progress Notes (Signed)
Occupational Therapy Discharge Summary  Patient Details  Name: Albert Stanley MRN: [id number] Date of Birth: 10-22-1945  Today's Date: 01/07/2018  Patient has met 55 of 14 long term goals due to improved activity tolerance, improved balance, postural control, functional use of  LEFT upper extremity, improved attention, improved awareness and improved coordination.  Patient to discharge at [facility] Assist level.  Patient's care partner requires assistance to provide the necessary physical and cognitive assistance at discharge, therefore he is discharging to a SNF to continue making progress prior to a d/c home with his wife.  Pt made great progress in therapy overall. In order to compensate for his L knee instability, he completes level surface ADL transfers with a slideboard and sit to stands with min A, although he is often easily fatigued. He performs shower and toilet transfers with mod A, often requiring max A if fatigued. Pt is now able to don UB clothing with set up, as well as LB clothing with min A. Pt has demonstrated carryover of several therapy techniques/education, including safe transfer techniques and figure 4 dressing.   Reasons goals not met: Pt is limited by his L knee instability, limiting his dynamic standing balance during ADLs and safety during ADL transfers. Pt continues to require cueing for safety awareness and transfer techniques.   Recommendation:  Patient will benefit from ongoing skilled OT services in skilled nursing facility setting to continue to advance functional skills in the area of BADL.  Equipment: TBD at SNF  Reasons for discharge: discharge from hospital  Patient/family agrees with progress made and goals achieved: Yes  OT Discharge Precautions/Restrictions  Precautions Precautions: Fall Precaution Comments: weak left knee (had problems with it before) Restrictions Weight Bearing Restrictions: No Pain Pain Assessment Faces Pain Scale: Hurts  even more Pain Type: Chronic pain Pain Location: Knee Pain Orientation: Left Pain Descriptors / Indicators: Aching ADL ADL ADL Comments: see functional navigator Vision Baseline Vision/History: Wears glasses Wears Glasses: Reading only Patient Visual Report: Diplopia Vision Assessment?: Vision impaired- to be further tested in functional context Ocular Range of Motion: Within Functional Limits Diplopia Assessment: Present in far gaze;Present all the time/all directions Perception  Perception: Within Functional Limits Praxis Praxis: Impaired Praxis Impairment Details: Motor planning Cognition Overall Cognitive Status: Impaired/Different from baseline Arousal/Alertness: Awake/alert Orientation Level: Oriented X4 Attention: Selective Focused Attention: Appears intact Sustained Attention: Appears intact Sustained Attention Impairment: Verbal basic;Functional basic Selective Attention: Impaired Selective Attention Impairment: Functional basic;Verbal basic Memory: Impaired Memory Impairment: Decreased recall of new information Awareness: Impaired Awareness Impairment: Emergent impairment Problem Solving: Impaired Problem Solving Impairment: Functional complex;Verbal complex Safety/Judgment: Impaired Sensation Sensation Light Touch: Impaired Detail Light Touch Impaired Details: Impaired LLE;Impaired RLE(Pt reports numbness in B feet) Stereognosis: Appears Intact Hot/Cold: Appears Intact Proprioception: Appears Intact Proprioception Impaired Details: Impaired LLE Coordination Gross Motor Movements are Fluid and Coordinated: No Fine Motor Movements are Fluid and Coordinated: Yes Coordination and Movement Description: Impaired coordination of LEs 2/2 increased edema Motor  Motor Motor: Other (comment)(generalized weakness) Motor - Skilled Clinical Observations: general weakness Mobility  Bed Mobility Bed Mobility: Sit to Supine;Supine to Sit Supine to Sit: With rails;5:  Supervision Supine to Sit Details: Verbal cues for technique;Verbal cues for sequencing;Verbal cues for precautions/safety Sit to Supine: 4: Min assist Sit to Supine - Details: Manual facilitation for weight shifting;Verbal cues for safe use of DME/AE;Verbal cues for precautions/safety;Verbal cues for technique Sit to Supine - Details (indicate cue type and reason): L LE requires assist to lift into  bed Transfers Transfers: Sit to Stand;Stand to Sit Sit to Stand: 4: Min assist Sit to Stand: Patient Percentage: 70% Sit to Stand Details: Manual facilitation for weight shifting;Manual facilitation for placement;Verbal cues for technique;Visual cues for safe use of DME/AE Stand to Sit: 4: Min assist Stand to Sit Details (indicate cue type and reason): Manual facilitation for weight shifting;Verbal cues for safe use of DME/AE;Verbal cues for precautions/safety;Manual facilitation for placement  Trunk/Postural Assessment  Cervical Assessment Cervical Assessment: Within Functional Limits Thoracic Assessment Thoracic Assessment: Exceptions to [facility] Thoracic AROM Overall Thoracic AROM Comments: Kyphotic Lumbar Assessment Lumbar Assessment: Exceptions to WFL(posterior pelvic tilt) Postural Control Postural Control: Deficits on evaluation Protective Responses: delayed  Balance Balance Balance Assessed: No Static Sitting Balance Static Sitting - Balance Support: Feet supported;No upper extremity supported Static Sitting - Level of Assistance: 5: Stand by assistance Dynamic Sitting Balance Dynamic Sitting - Balance Support: During functional activity;Feet supported Dynamic Sitting - Level of Assistance: 5: Stand by assistance Dynamic Sitting - Balance Activities: Reaching for objects Sitting balance - Comments: Pt able to maintain EOB sitting x 15 mins with min guard assist  Static Standing Balance Static Standing - Balance Support: During functional activity Static Standing - Level of  Assistance: 4: Min assist Dynamic Standing Balance Dynamic Standing - Balance Support: During functional activity Dynamic Standing - Level of Assistance: 4: Min assist Dynamic Standing - Balance Activities: Reaching for objects Extremity/Trunk Assessment RUE Assessment RUE Assessment: Within Functional Limits LUE Assessment LUE Assessment: Exceptions to WFL LUE AROM (degrees) LUE Overall AROM Comments: 0-100 degrees - pt with increased edema in ARM from new HD graft  LUE Strength LUE Overall Strength Comments: 3+/5   See Function Navigator for Current Functional Status.  Curtis Sites 01/07/2018, 11:56 AM

## 2018-01-07 NOTE — Telephone Encounter (Signed)
Hard script written by Durenda Age, NP and given to [facility], [employer].

## 2018-01-07 NOTE — Assessment & Plan Note (Signed)
PT/OT @ SNF 

## 2018-01-14 ENCOUNTER — Non-Acute Institutional Stay (SKILLED_NURSING_FACILITY): Payer: Medicare Other | Admitting: Adult Health

## 2018-01-14 ENCOUNTER — Encounter: Payer: Self-pay | Admitting: Adult Health

## 2018-01-14 DIAGNOSIS — K5901 Slow transit constipation: Secondary | ICD-10-CM | POA: Diagnosis not present

## 2018-01-14 DIAGNOSIS — E1142 Type 2 diabetes mellitus with diabetic polyneuropathy: Secondary | ICD-10-CM | POA: Diagnosis not present

## 2018-01-14 DIAGNOSIS — N186 End stage renal disease: Secondary | ICD-10-CM | POA: Diagnosis not present

## 2018-01-14 DIAGNOSIS — Z992 Dependence on renal dialysis: Secondary | ICD-10-CM | POA: Diagnosis not present

## 2018-01-14 DIAGNOSIS — L299 Pruritus, unspecified: Secondary | ICD-10-CM | POA: Diagnosis not present

## 2018-01-14 DIAGNOSIS — M1712 Unilateral primary osteoarthritis, left knee: Secondary | ICD-10-CM | POA: Diagnosis not present

## 2018-01-14 NOTE — Progress Notes (Signed)
Location:  [facility] Room Number: 678-L Place of Service:  SNF (31) Provider:  Durenda Age, NP  Patient Care Team: [name], MD as PCP - General (Internal Medicine) Center, [facility] Larey Dresser, MD as Consulting Physician (Cardiology) Deterding, Jeneen Rinks, MD as Consulting Physician (Nephrology) Center, [facility]  Extended Emergency Contact Information Primary Emergency Contact: [place] Address: [place], [place] Phone: [phone] Mobile Phone: [phone] Relation: Spouse Secondary Emergency Contact: Ozella Almond [place] Mobile Phone: [phone] Relation: Daughter  Code Status:  DNR  Goals of care: Advanced Directive information Advanced Directives 01/14/2018  Does Patient Have a Medical Advance Directive? Yes  Type of Advance Directive Out of facility DNR (pink MOST or yellow form)  Does patient want to make changes to medical advance directive? No - Patient declined  Would patient like information on creating a medical advance directive? No - Patient declined     Chief Complaint  Patient presents with  . Acute Visit    Medication management    HPI:  Pt is a 72 y.o. male seen today for an acute visit for care management.  The patient was requesting Benadryl for itching and the family was requesting an orthopedic consult, as well as a prescription for a lift chair.  He is a short-term rehabilitation resident of [facility].  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   He has been admitted to [facility] on 01/05/18 from [facility] admission dates 12/16/17 to 01/05/18 with Traumatic right subdural hematoma, S/P right  frontotemporal parietal craniotomy on December 05, 2017, right femoral and posterior tibial deep vein thrombosis, S/P IVC filter placement on December 18, 2017, ESRD, DM, peripheral neuropathy, acute on chronic anemia, diastolic CHF and history of bladder cancer S/P resection 1999 with chronic foley catheter. He had fallen on December 03, 2017 when he tripped on the carpet. He had denied striking his head. Cranial CT scan showed right subdural hematoma measuring 16 mm  With mass affect. Neurosurgery was consulted and underwent right frontotemporal parietal craniotomy, hematoma evacuation on December 05, 2017.   He was seen in the room today. He requested medication for itching. No rashes were noted. Lactulose PRN was requested for constipation. He said that his left knee has been hurting at times. Wife called and requested for to be seen by Dr. Wynelle Link. Wife said that resident was previously diagnosed with left knee osteoarthritis by another doctor.   Past Medical History:  Diagnosis Date  . Arthritis    Osteoarthritis  . Asthma   . Bladder cancer ([place]) dx'd 1990   surg only  . CHF (congestive heart failure) ([place])   . Diabetes mellitus without complication ([place])   . ESRD (end stage renal disease) ([name])    Tues, Thurs, Sat dialysis  . GERD (gastroesophageal reflux disease)   . History of cardiac catheterization    a. LHC 5/17: no obstructive CAD  . History of DVT (deep vein thrombosis)    2003 ish  . History of nuclear stress test    a. Myoview 4/17: EF 39%, inf, inf-lat, apical inf, apical lat, apical scar, intermediate risk  . Hyperlipidemia   . Hypertension   . NICM (nonischemic cardiomyopathy) ([place])    a. Echo 2/17:  mod LVH, EF 35-40%, inf-lat and inf-sept HK, mod MR, severe LAE, small pericardial effusion   Past Surgical History:  Procedure Laterality Date  . arthroscopic knee surgery Left   . AV FISTULA PLACEMENT Left 12/13/2015   Procedure: ARTERIOVENOUS (AV) FISTULA CREATION;   Surgeon: Angelia Mould, MD;  Location: [facility] OR;  Service: Vascular;  Laterality: Left;  . AV FISTULA PLACEMENT Left 12/15/2017   Procedure: INSERTION OF ARTERIOVENOUS (AV) GORE-TEX GRAFT ARM;  Surgeon: Serafina Mitchell, MD;  Location: [place];  Service: Vascular;  Laterality: Left;  . BACK SURGERY     2 times  1960  . CARDIAC CATHETERIZATION N/A 02/28/2016   Procedure: Left Heart Cath and Coronary Angiography;  Surgeon: Larey Dresser, MD;  Location: [place] CV LAB;  Service: Cardiovascular;  Laterality: N/A;  . CRANIOTOMY N/A 12/05/2017   Procedure: CRANIOTOMY HEMATOMA EVACUATION SUBDURAL;  Surgeon: Ashok Pall, MD;  Location: [place];  Service: Neurosurgery;  Laterality: N/A;  . CYSTOSCOPY    . FISTULA SUPERFICIALIZATION Left 08/14/2016   Procedure: FISTULA SUPERFICIALIZATION LEFT UPPER ARM;  Surgeon: Angelia Mould, MD;  Location: [place];  Service: Vascular;  Laterality: Left;  . INSERTION OF DIALYSIS CATHETER N/A 12/13/2015   Procedure: INSERTION OF DIALYSIS CATHETER;  Surgeon: Angelia Mould, MD;  Location: [facility] [place];  Service: Vascular;  Laterality: N/A;  . INSERTION OF DIALYSIS CATHETER Left 05/07/2017   Procedure: INSERTION OF DIALYSIS CATHETER LEFT INTERNAL JUGULAR PLACEMENT;  Surgeon: Angelia Mould, MD;  Location: [place];  Service: Vascular;  Laterality: Left;  . INSERTION OF DIALYSIS CATHETER Left 12/15/2017   Procedure: INSERTION OF DIALYSIS CATHETER;  Surgeon: Serafina Mitchell, MD;  Location: [facility] OR;  Service: Vascular;  Laterality: Left;  . IR IVC FILTER PLMT / S&I Burke Keels GUID/MOD SED  12/18/2017  . IR RADIOLOGIST EVAL & MGMT  05/12/2017  . JOINT REPLACEMENT Left    hip  . LIGATION OF COMPETING BRANCHES OF ARTERIOVENOUS FISTULA Left 08/14/2016   Procedure: LIGATION OF COMPETING BRANCHES OF ARTERIOVENOUS FISTULA LEFT UPPER ARM;  Surgeon: Angelia Mould, MD;  Location: [place];  Service: Vascular;  Laterality: Left;  . PERIPHERAL VASCULAR CATHETERIZATION Left  06/09/2016   Procedure: Fistulagram;  Surgeon: Serafina Mitchell, MD;  Location: [place] CV LAB;  Service: Cardiovascular;  Laterality: Left;  ARM  . PERIPHERAL VASCULAR CATHETERIZATION Left 06/09/2016   Procedure: Peripheral Vascular Balloon Angioplasty;  Surgeon: Serafina Mitchell, MD;  Location: [place] CV LAB;  Service: Cardiovascular;  Laterality: Left;  upper arm venous  . PERIPHERAL VASCULAR CATHETERIZATION Left 06/25/2016   Procedure: Fistulagram;  Surgeon: Serafina Mitchell, MD;  Location: [place] CV LAB;  Service: Cardiovascular;  Laterality: Left;  . REVISON OF ARTERIOVENOUS FISTULA Left 05/07/2017   Procedure: REPAIR OF PSEUDOANEURYSM  LEFT BRACHIO-CEPHALIC  ARM ARTERIOVENOUS FISTULA;  Surgeon: Angelia Mould, MD;  Location: [place];  Service: Vascular;  Laterality: Left;    Allergies  Allergen Reactions  . Meperidine Shortness Of Breath and Other (See Comments)    Increased heart rate Reaction to demerol  . Penicillins Other (See Comments)    "Knocks me out."   PATIENT HAS HAD A PCN REACTION WITH IMMEDIATE RASH, FACIAL/TONGUE/THROAT SWELLING, SOB, OR LIGHTHEADEDNESS WITH HYPOTENSION:  # # #  YES  >> SYNCOPE ### Has patient had a PCN reaction causing severe rash involving mucus membranes or skin necrosis: unknown PATIENT HAS HAD A PCN REACTION THAT REQUIRED TREATMENT AT MD'S OFFICE Has patient  had a PCN reaction occurring within the last 10 years: no   . Sulfa Antibiotics Shortness Of Breath and Other (See Comments)    Increased heart rate  . Sulfonamide Derivatives Shortness Of Breath and Other (See Comments)    TACHYCARDIA  . [place] Diarrhea, Nausea And Vomiting and Other (See Comments)    Constipation Reaction to whole milk  . Lactose Intolerance (Gi) Diarrhea, Nausea And Vomiting and Other (See Comments)    Constipation Reaction to whole milk  . Milk-Related Compounds Diarrhea, Nausea And Vomiting and Other (See Comments)    Constipation Reaction to whole  milk  . Foltx [Elfolate Plus]     UNSPECIFIED REACTION  "Pt does not recall reaction to this"  . Betadine [Povidone Iodine] Itching, Rash and Other (See Comments)    burning  . Eggs Or Egg-Derived Products Nausea And Vomiting    Reaction to egg yolks - not whites      Outpatient Encounter Medications as of 01/14/2018  Medication Sig  . allopurinol (ZYLOPRIM) 300 MG tablet Take 300 mg by mouth daily.   . Amino Acids-Protein Hydrolys (FEEDING SUPPLEMENT, PRO-STAT SUGAR FREE 64,) LIQD Take 30 mLs by mouth 2 (two) times daily.  Marland Kitchen amiodarone (PACERONE) 200 MG tablet Take 1 tablet (200 mg total) by mouth daily.  . Bisacodyl (DULCOLAX PO) Take 5 mg by mouth at bedtime.  . calcitRIOL (ROCALTROL) 0.5 MCG capsule Take 1 capsule (0.5 mcg total) by mouth Every Tuesday,Thursday,and Saturday with dialysis. Medication given at dialysis on Tues Thurs Sat  . Darbepoetin Alfa (ARANESP) 150 MCG/0.3ML SOSY injection Inject 150 mcg into the skin every 7 (seven) days. Give on Saturdays with dialysis  . diclofenac sodium (VOLTAREN) 1 % GEL Apply 2 g topically 3 (three) times daily. Apply to lower back, left hip and knee  . Melatonin 3 MG TABS Take 6 mg by mouth at bedtime.  . multivitamin (RENA-VIT) TABS tablet Take 1 tablet by mouth at bedtime.   Marland Kitchen oxybutynin (DITROPAN) 5 MG/5ML syrup Take 5 mLs (5 mg total) by mouth 2 (two) times daily.  Marland Kitchen oxyCODONE-acetaminophen (PERCOCET) 7.5-325 MG tablet Take 1 tablet by mouth every 6 (six) hours as needed for moderate pain.  . pantoprazole (PROTONIX) 40 MG tablet Take 40 mg by mouth at bedtime.  . polyethylene glycol (MIRALAX / GLYCOLAX) packet Take 17 g by mouth daily. Hold for loose stool  . sevelamer carbonate (RENVELA) 800 MG tablet Take 2 tablets (1,600 mg total) by mouth 3 (three) times daily with meals.   No facility-administered encounter medications on file as of 01/14/2018.     Review of Systems  GENERAL: No change in appetite, no fatigue, no weight changes,  no fever, chills or weakness MOUTH and THROAT: Denies oral discomfort, gingival pain or bleeding, pain from teeth or hoarseness   RESPIRATORY: no cough, SOB, DOE, wheezing, hemoptysis CARDIAC: No chest pain, or palpitations GI: No abdominal pain, diarrhea, constipation, heart burn, nausea or vomiting PSYCHIATRIC: Denies feelings of depression or anxiety. No report of hallucinations, insomnia, paranoia, or agitation    Pertinent  Health Maintenance Due  Topic Date Due  . FOOT EXAM  06/09/1956  . OPHTHALMOLOGY EXAM  06[birth year]09/1956  . URINE MICROALBUMIN  06[birth year]09/1955  . COLONOSCOPY  06/09/1995  . PNA vac Low Risk Adult (1 of 2 - PCV13) 06/08/2010  . HEMOGLOBIN A1C  06/08/2017  . INFLUENZA VACCINE  05/12/2018      Vitals:   01/14/18 1431  BP: 130/60  Pulse: 76  Resp: 18  Temp: 98.6 F (37 C)  TempSrc: Oral  SpO2: 98%  Weight: 211 lb 10.2 oz (96 kg)  Height: 5\' 9"  (1.753 m)   Body mass index is 31.25 kg/m.  Physical Exam  GENERAL APPEARANCE: Well nourished. In no acute distress. Obese SKIN:  Skin is warm and dry.  MOUTH and THROAT: Lips are without lesions. Oral mucosa is moist and without lesions. Tongue is normal in shape, size, and color and without lesions RESPIRATORY: Breathing is even & unlabored, BS CTAB CARDIAC: RRR, no murmur,no extra heart sounds, BLE 1+ edema, Left chest has tunneled dialysis  Cath on left chest GI: Abdomen soft, normal BS, no masses, no tenderness GU:  Has foley catheter draining to urine bag with yellowish urine EXTREMITIES:  Able to move X 4 extremities PSYCHIATRIC: Alert to self, disoriented to time and place. Affect and behavior are appropriate   Labs reviewed: Recent Labs    12/09/17 0430 12/10/17 0517 12/11/17 0352  12/31/17 1908 01/01/18 1742 01/03/18 0934 01/04/18 1226  NA 132* 136 133*   < > 127* 125* 125* 124*  K 5.9* 5.2* 4.4   < > 5.2* 5.6* 4.8 5.3*  CL 95* 97* 92*   < > 89* 88* 88* 86*  CO2 21* 26 23   < > 25 23 23 24     GLUCOSE 165* 112* 176*   < > 95 65 118* 92  BUN 61* 43* 67*   < > 71* 79* 53* 63*  CREATININE 8.23* 5.54* 6.71*   < > 8.63* 9.54* 7.58* 8.79*  CALCIUM 7.7* 8.2* 8.5*   < > 8.2* 8.2* 8.4* 8.6*  MG 2.3 2.3 2.4  --   --   --   --   --   PHOS  --   --   --    < > 4.2 5.0*  --  5.3*   < > = values in this interval not displayed.   Recent Labs    12/09/17 0430 12/10/17 0517 12/11/17 0352  12/31/17 1908 01/01/18 1742 01/04/18 1226  AST 292* 337* 265*  --   --   --   --   ALT 504* 503* 488*  --   --   --   --   ALKPHOS 88 120 140*  --   --   --   --   BILITOT 1.2 1.8* 1.6*  --   --   --   --   PROT 5.3* 5.4* 6.0*  --   --   --   --   ALBUMIN 2.5* 2.5* 2.8*   < > 2.6* 2.6* 3.0*   < > = values in this interval not displayed.   Recent Labs    12/09/17 0430 12/10/17 0517 12/11/17 0352  12/31/17 1908 01/01/18 1724 01/04/18 1226  WBC 13.6* 11.2* 8.2   < > 6.6 5.8 5.8  NEUTROABS 11.6* 9.4* 6.6  --   --   --   --   HGB 7.4* 7.4* 7.4*   < > 8.8* 8.8* 8.6*  HCT 22.2* 22.0* 22.4*   < > 26.9* 26.7* 25.7*  MCV 89.2 90.9 90.3   < > 95.1 93.7 94.1  PLT 146* 170 154   < > 213 207 180   < > = values in this interval not displayed.   Lab Results  Component Value Date   TSH 2.537 04/05/2017   Lab Results  Component Value Date   HGBA1C 6.4 (H) 12/09/2016  Lab Results  Component Value Date   CHOL 91 02/14/2016   HDL 37 (L) 02/14/2016   LDLCALC 45 02/14/2016   TRIG 49 12/05/2017   CHOLHDL 2.5 02/14/2016    Significant Diagnostic Results in last 30 days:  Dg Chest 2 View  Result Date: 12/24/2017 CLINICAL DATA:  Shortness of breath. EXAM: CHEST - 2 VIEW COMPARISON:  Chest x-ray 12/15/2017. FINDINGS: Dual-lumen catheter noted with scratched it left IJ dual-lumen catheter with tip over right atrium. Cardiomegaly with pulmonary venous congestion. Low lung volumes with mild basilar atelectasis. No pleural effusion or pneumothorax. IVC filter noted over the region of the upper IVC. No acute  bony abnormality. IMPRESSION: 1.  Left IJ dual-lumen catheter with tip over the right atrium. 2.  Cardiomegaly with mild pulmonary venous congestion. 3.  Low lung volumes with mild basilar atelectasis. Electronically Signed   By: Marcello Moores  Register   On: 12/24/2017 13:06   Ir Ivc Filter Plmt / S&i /img Guid/mod Sed  Result Date: 12/18/2017 CLINICAL DATA:  Bilateral DVT. Subdural hematoma, a relative contraindication anticoagulation. Caval filtration is requested. EXAM: INFERIOR VENACAVOGRAM IVC FILTER PLACEMENT UNDER FLUOROSCOPY FLUOROSCOPY TIME:  0.7 minute; 81 uGym2 DAP TECHNIQUE: The procedure, risks (including but not limited to bleeding, infection, organ damage ), benefits, and alternatives were explained to the patient. Questions regarding the procedure were encouraged and answered. The patient understands and consents to the procedure. Ultrasound demonstrates thrombus in the right IJ vein. Patency of the right external jugular vein was confirmed with ultrasound with image documentation. An appropriate skin site was determined. Skin site was marked, prepped with chlorhexidine, and draped using maximum barrier technique. The region was infiltrated locally with 1% lidocaine. Intravenous Fentanyl administered for pain control during continuous monitoring of the patient's level of consciousness and physiological / cardiorespiratory status by the radiology RN, with a total of survey shin time of 10 minutes. Under real-time ultrasound guidance, the right EJ vein was accessed with a 21 gauge micropuncture needle; the needle tip within the vein was confirmed with ultrasound image documentation. The needle was exchanged over a 018 guidewire for a transitional dilator, which allow [facility] of [facility] wire into the IVC. A long 6 French vascular sheath was placed for inferior venacavography. This demonstrated no caval thrombus. Renal vein inflows were evident. The [facility] IVC filter was advanced through the sheath  and successfully deployed under fluoroscopy at the L2 level. Followup cavagram demonstrates stable filter position and no evident complication. The sheath was removed and hemostasis achieved at the site. No immediate complication. IMPRESSION: 1. Normal IVC. No thrombus or significant anatomic variation. 2. Technically successful infrarenal IVC filter placement. This is a retrievable model. PLAN: This IVC filter is potentially retrievable. The patient will be assessed for filter retrieval by Interventional Radiology in approximately 8-12 weeks. Further recommendations regarding filter retrieval, continued surveillance or declaration of device permanence, will be made at that time. Electronically Signed   By: Lucrezia Europe M.D.   On: 12/18/2017 12:17    Assessment/Plan  1. Osteoarthritis of left knee, unspecified osteoarthritis type - wife reported that he has been diagnosed by another doctor and need surgery, wants to see Dr. Wynelle Link so he will be referred, continue Percocet 7.5-325 mg 1 tab Q 6 hours PRN  2. Type 2 diabetes mellitus with peripheral neuropathy ([name]) - noted that he is not on any medication at this time, will check CBG daily and check hgbA1c  3. Slow transit constipation - wife requested for Lactulose  PRN, ordered   4. ESRD - continue hemodialysis  Q Tuesdays, Thursdays and Saturdays  5. Pruritus - no rashes noted, will start Atarax 25 mg 1 tab daily PRN     Durenda Age, NP [place] [phone] (Monday-Friday 8:00 a.m. - 5:00 p.m.) [phone] (after hours)

## 2018-01-18 ENCOUNTER — Encounter (HOSPITAL_COMMUNITY): Payer: Self-pay

## 2018-01-18 ENCOUNTER — Emergency Department (HOSPITAL_COMMUNITY)
Admission: EM | Admit: 2018-01-18 | Discharge: 2018-01-18 | Disposition: A | Discharge status: Home / Self Care | Payer: Medicare Other | Attending: Emergency Medicine | Admitting: Emergency Medicine

## 2018-01-18 ENCOUNTER — Emergency Department (HOSPITAL_COMMUNITY): Payer: Medicare Other

## 2018-01-18 DIAGNOSIS — I132 Hypertensive heart and chronic kidney disease with heart failure and with stage 5 chronic kidney disease, or end stage renal disease: Secondary | ICD-10-CM | POA: Insufficient documentation

## 2018-01-18 DIAGNOSIS — S0181XA Laceration without foreign body of other part of head, initial encounter: Secondary | ICD-10-CM | POA: Insufficient documentation

## 2018-01-18 DIAGNOSIS — Z96642 Presence of left artificial hip joint: Secondary | ICD-10-CM | POA: Diagnosis not present

## 2018-01-18 DIAGNOSIS — W01190A Fall on same level from slipping, tripping and stumbling with subsequent striking against furniture, initial encounter: Secondary | ICD-10-CM | POA: Insufficient documentation

## 2018-01-18 DIAGNOSIS — Z8551 Personal history of malignant neoplasm of bladder: Secondary | ICD-10-CM | POA: Insufficient documentation

## 2018-01-18 DIAGNOSIS — N186 End stage renal disease: Secondary | ICD-10-CM | POA: Insufficient documentation

## 2018-01-18 DIAGNOSIS — I5032 Chronic diastolic (congestive) heart failure: Secondary | ICD-10-CM | POA: Diagnosis not present

## 2018-01-18 DIAGNOSIS — Y999 Unspecified external cause status: Secondary | ICD-10-CM | POA: Diagnosis not present

## 2018-01-18 DIAGNOSIS — S0101XA Laceration without foreign body of scalp, initial encounter: Secondary | ICD-10-CM | POA: Diagnosis not present

## 2018-01-18 DIAGNOSIS — Z79899 Other long term (current) drug therapy: Secondary | ICD-10-CM | POA: Insufficient documentation

## 2018-01-18 DIAGNOSIS — H05231 Hemorrhage of right orbit: Secondary | ICD-10-CM

## 2018-01-18 DIAGNOSIS — J45909 Unspecified asthma, uncomplicated: Secondary | ICD-10-CM | POA: Diagnosis not present

## 2018-01-18 DIAGNOSIS — E1122 Type 2 diabetes mellitus with diabetic chronic kidney disease: Secondary | ICD-10-CM | POA: Diagnosis not present

## 2018-01-18 DIAGNOSIS — Y92129 Unspecified place in nursing home as the place of occurrence of the external cause: Secondary | ICD-10-CM | POA: Insufficient documentation

## 2018-01-18 DIAGNOSIS — Z992 Dependence on renal dialysis: Secondary | ICD-10-CM | POA: Diagnosis not present

## 2018-01-18 DIAGNOSIS — Z87891 Personal history of nicotine dependence: Secondary | ICD-10-CM | POA: Insufficient documentation

## 2018-01-18 DIAGNOSIS — Y9389 Activity, other specified: Secondary | ICD-10-CM | POA: Insufficient documentation

## 2018-01-18 DIAGNOSIS — S0993XA Unspecified injury of face, initial encounter: Secondary | ICD-10-CM | POA: Diagnosis present

## 2018-01-18 DIAGNOSIS — W19XXXA Unspecified fall, initial encounter: Secondary | ICD-10-CM

## 2018-01-18 LAB — CBC WITH DIFFERENTIAL/PLATELET
Basophils Absolute: 0 10*3/uL (ref 0.0–0.1)
Basophils Relative: 0 %
EOS PCT: 4 %
Eosinophils Absolute: 0.2 10*3/uL (ref 0.0–0.7)
HCT: 23.4 % — ABNORMAL LOW (ref 39.0–52.0)
Hemoglobin: 7.7 g/dL — ABNORMAL LOW (ref 13.0–17.0)
LYMPHS ABS: 1.1 10*3/uL (ref 0.7–4.0)
LYMPHS PCT: 19 %
MCH: 30.2 pg (ref 26.0–34.0)
MCHC: 32.9 g/dL (ref 30.0–36.0)
MCV: 91.8 fL (ref 78.0–100.0)
MONO ABS: 0.9 10*3/uL (ref 0.1–1.0)
Monocytes Relative: 14 %
Neutro Abs: 3.8 10*3/uL (ref 1.7–7.7)
Neutrophils Relative %: 63 %
PLATELETS: 85 10*3/uL — AB (ref 150–400)
RBC: 2.55 MIL/uL — AB (ref 4.22–5.81)
RDW: 17.3 % — ABNORMAL HIGH (ref 11.5–15.5)
WBC: 6.1 10*3/uL (ref 4.0–10.5)

## 2018-01-18 LAB — BASIC METABOLIC PANEL
Anion gap: 13 (ref 5–15)
BUN: 44 mg/dL — AB (ref 6–20)
CHLORIDE: 93 mmol/L — AB (ref 101–111)
CO2: 26 mmol/L (ref 22–32)
Calcium: 8.9 mg/dL (ref 8.9–10.3)
Creatinine, Ser: 7.62 mg/dL — ABNORMAL HIGH (ref 0.61–1.24)
GFR calc Af Amer: 7 mL/min — ABNORMAL LOW (ref >60)
GFR calc non Af Amer: 6 mL/min — ABNORMAL LOW (ref >60)
GLUCOSE: 128 mg/dL — AB (ref 65–99)
POTASSIUM: 4 mmol/L (ref 3.5–5.1)
Sodium: 132 mmol/L — ABNORMAL LOW (ref 135–145)

## 2018-01-18 LAB — PROTIME-INR
INR: 1.59
Prothrombin Time: 18.8 seconds — ABNORMAL HIGH (ref 11.4–15.2)

## 2018-01-18 MED ORDER — BACITRACIN ZINC 500 UNIT/GM EX OINT: 1.0000 "application " | TOPICAL_OINTMENT | Freq: Two times a day (BID) | CUTANEOUS | 0 refills | Status: DC

## 2018-01-18 MED ORDER — LIDOCAINE-EPINEPHRINE (PF) 2 %-1:200000 IJ SOLN
10.0000 mL | Freq: Once | INTRAMUSCULAR | Status: AC
  Administered 2018-01-18: 10 mL
  Filled 2018-01-18: qty 20

## 2018-01-18 NOTE — Consult Note (Signed)
ENT/FACIAL TRAUMA CONSULT:  Reason for Consult: Complex right supraorbital laceration with hemorrhage (2 cm) Referring Physician:  EDP  Albert Stanley is an 72 y.o. male.  HPI: Patient presents to [facility] emergency department after falling at his nursing care facility.  The patient sustained several lacerations including a 2 cm complex laceration involving the right supraorbital region and brow as well as a 4 cm laceration on the superior scalp.  ENT/facial trauma consulted for evaluation of pulsatile hemorrhage from the right supraorbital laceration.  Past Medical History:  Diagnosis Date  . Arthritis    Osteoarthritis  . Asthma   . Bladder cancer ([place]) dx'd 1990   surg only  . CHF (congestive heart failure) ([place])   . Diabetes mellitus without complication ([place])   . ESRD (end stage renal disease) ([name])    Tues, Thurs, Sat dialysis  . GERD (gastroesophageal reflux disease)   . History of cardiac catheterization    a. LHC 5/17: no obstructive CAD  . History of DVT (deep vein thrombosis)    2003 ish  . History of nuclear stress test    a. Myoview 4/17: EF 39%, inf, inf-lat, apical inf, apical lat, apical scar, intermediate risk  . Hyperlipidemia   . Hypertension   . NICM (nonischemic cardiomyopathy) ([place])    a. Echo 2/17: mod LVH, EF 35-40%, inf-lat and inf-sept HK, mod MR, severe LAE, small pericardial effusion    Past Surgical History:  Procedure Laterality Date  . arthroscopic knee surgery Left   . AV FISTULA PLACEMENT Left 12/13/2015   Procedure: ARTERIOVENOUS (AV) FISTULA CREATION;  Surgeon: Angelia Mould, MD;  Location: [facility] OR;  Service: Vascular;  Laterality: Left;  . AV FISTULA PLACEMENT Left 12/15/2017   Procedure: INSERTION OF ARTERIOVENOUS (AV) GORE-TEX GRAFT ARM;  Surgeon: Serafina Mitchell, MD;  Location: [place];  Service: Vascular;  Laterality: Left;  . BACK SURGERY     2 times  1960  . CARDIAC CATHETERIZATION N/A 02/28/2016   Procedure: Left Heart Cath and  Coronary Angiography;  Surgeon: Larey Dresser, MD;  Location: [place] [facility];  Service: Cardiovascular;  Laterality: N/A;  . CRANIOTOMY N/A 12/05/2017   Procedure: CRANIOTOMY HEMATOMA EVACUATION SUBDURAL;  Surgeon: Ashok Pall, MD;  Location: [place];  Service: Neurosurgery;  Laterality: N/A;  . CYSTOSCOPY    . FISTULA SUPERFICIALIZATION Left 08/14/2016   Procedure: FISTULA SUPERFICIALIZATION LEFT UPPER ARM;  Surgeon: Angelia Mould, MD;  Location: [place];  Service: Vascular;  Laterality: Left;  . INSERTION OF DIALYSIS CATHETER N/A 12/13/2015   Procedure: INSERTION OF DIALYSIS CATHETER;  Surgeon: Angelia Mould, MD;  Location: [place];  Service: Vascular;  Laterality: N/A;  . INSERTION OF DIALYSIS CATHETER Left 05/07/2017   Procedure: INSERTION OF DIALYSIS CATHETER LEFT INTERNAL JUGULAR PLACEMENT;  Surgeon: Angelia Mould, MD;  Location: [place];  Service: Vascular;  Laterality: Left;  . INSERTION OF DIALYSIS CATHETER Left 12/15/2017   Procedure: INSERTION OF DIALYSIS CATHETER;  Surgeon: Serafina Mitchell, MD;  Location: [facility];  Service: Vascular;  Laterality: Left;  . IR IVC FILTER PLMT / S&I Burke Keels GUID/MOD SED  12/18/2017  . IR RADIOLOGIST EVAL & MGMT  05/12/2017  . JOINT REPLACEMENT Left    hip  . LIGATION OF COMPETING BRANCHES OF ARTERIOVENOUS FISTULA Left 08/14/2016   Procedure: LIGATION OF COMPETING BRANCHES OF ARTERIOVENOUS FISTULA LEFT UPPER ARM;  Surgeon: Angelia Mould, MD;  Location: [place];  Service: Vascular;  Laterality: Left;  .  PERIPHERAL VASCULAR CATHETERIZATION Left 06/09/2016   Procedure: Fistulagram;  Surgeon: Serafina Mitchell, MD;  Location: [place] [facility];  Service: Cardiovascular;  Laterality: Left;  ARM  . PERIPHERAL VASCULAR CATHETERIZATION Left 06/09/2016   Procedure: Peripheral Vascular Balloon Angioplasty;  Surgeon: Serafina Mitchell, MD;  Location: [place] [facility];  Service: Cardiovascular;  Laterality: Left;  upper arm venous  . PERIPHERAL VASCULAR  CATHETERIZATION Left 06/25/2016   Procedure: Fistulagram;  Surgeon: Serafina Mitchell, MD;  Location: [place] [facility];  Service: Cardiovascular;  Laterality: Left;  . REVISON OF ARTERIOVENOUS FISTULA Left 05/07/2017   Procedure: REPAIR OF PSEUDOANEURYSM  LEFT BRACHIO-CEPHALIC  ARM ARTERIOVENOUS FISTULA;  Surgeon: Angelia Mould, MD;  Location: [facility] OR;  Service: Vascular;  Laterality: Left;    Family History  Problem Relation Age of Onset  . Arthritis Mother   . Cancer Father   . Heart disease Father   . Hypertension Sister   . Alcohol abuse Brother   . Diabetes Daughter   . Heart attack Paternal Uncle     Social History:  reports that he quit smoking about 19 years ago. He quit after 30.00 years of use. He has never used smokeless tobacco. He reports that he does not drink alcohol or use drugs.  Allergies:  Allergies  Allergen Reactions  . Meperidine Shortness Of Breath and Other (See Comments)    Increased heart rate Reaction to demerol  . Penicillins Other (See Comments)    "Knocks me out."   PATIENT HAS HAD A PCN REACTION WITH IMMEDIATE RASH, FACIAL/TONGUE/THROAT SWELLING, SOB, OR LIGHTHEADEDNESS WITH HYPOTENSION:  # # #  YES  >> SYNCOPE ### Has patient had a PCN reaction causing severe rash involving mucus membranes or skin necrosis: unknown PATIENT HAS HAD A PCN REACTION THAT REQUIRED TREATMENT AT MD'S OFFICE Has patient had a PCN reaction occurring within the last 10 years: no   . Sulfa Antibiotics Shortness Of Breath and Other (See Comments)    Increased heart rate  . Sulfonamide Derivatives Shortness Of Breath and Other (See Comments)    TACHYCARDIA  . Lac Bovis Diarrhea, Nausea And Vomiting and Other (See Comments)    Constipation Reaction to whole milk  . Lactose Intolerance (Gi) Diarrhea, Nausea And Vomiting and Other (See Comments)    Constipation Reaction to whole milk  . Milk-Related Compounds Diarrhea, Nausea And Vomiting and Other (See Comments)     Constipation Reaction to whole milk  . Foltx [Elfolate Plus]     UNSPECIFIED REACTION  "Pt does not recall reaction to this"  . Betadine [Povidone Iodine] Itching, Rash and Other (See Comments)    burning  . Eggs Or Egg-Derived Products Nausea And Vomiting    Reaction to egg yolks - not whites      Medications: I have reviewed the patient's current medications.  Results for orders placed or performed during the hospital encounter of 01/18/18 (from the past 48 hour(s))  Protime-INR     Status: Abnormal   Collection Time: 01/18/18  4:16 AM  Result Value Ref Range   Prothrombin Time 18.8 (H) 11.4 - 15.2 seconds   INR 1.59     Comment: Performed at [facility] [address], [place], [place] [address]  Basic metabolic panel     Status: Abnormal   Collection Time: 01/18/18  4:16 AM  Result Value Ref Range   Sodium 132 (L) 135 - 145 mmol/L   Potassium 4.0 3.5 - 5.1 mmol/L  Chloride 93 (L) 101 - 111 mmol/L   CO2 26 22 - 32 mmol/L   Glucose, Bld 128 (H) 65 - 99 mg/dL   BUN 44 (H) 6 - 20 mg/dL   Creatinine, Ser 7.62 (H) 0.61 - 1.24 mg/dL   Calcium 8.9 8.9 - 10.3 mg/dL   GFR calc non Af Amer 6 (L) >60 mL/min   GFR calc Af Amer 7 (L) >60 mL/min    Comment: (NOTE) The eGFR has been calculated using the CKD EPI equation. This calculation has not been validated in all clinical situations. eGFR's persistently <60 mL/min signify possible Chronic Kidney Disease.    Anion gap 13 5 - 15    Comment: Performed at [facility] [address], [place], [place] [address]    Ct Head Wo Contrast  Result Date: 01/18/2018 CLINICAL DATA:  Fall striking head on night stand. Laceration head and above right eye. EXAM: CT HEAD WITHOUT CONTRAST CT MAXILLOFACIAL WITHOUT CONTRAST CT CERVICAL SPINE WITHOUT CONTRAST TECHNIQUE: Multidetector CT imaging of the head, cervical spine, and maxillofacial structures were performed using the standard protocol without intravenous contrast. Multiplanar  CT image reconstructions of the cervical spine and maxillofacial structures were also generated. COMPARISON:  Head CT 06/04/2018. Head, face, and cervical spine CT 07/03/2017 FINDINGS: CT HEAD FINDINGS Brain: Interval evacuation of right subdural hematoma. Minimal residual subdural collection measures 3 mm. No evidence of acute hemorrhagic component. Resolution of previous midline shift. No subarachnoid, intraparenchymal, or epidural hematoma. No evidence of acute ischemia. No hydrocephalus. Generalized atrophy and mild chronic small vessel ischemia. Vascular: Atherosclerosis of skullbase vasculature without hyperdense vessel or abnormal calcification. Skull: Right great toe on a me.  No skull fracture. Other: Laceration to the midline frontal region with linear air in the scalp soft tissues. CT MAXILLOFACIAL FINDINGS Osseous: No acute fracture of the nasal bone, zygomatic arches or mandibles. Anterior translation of the left mandible at the temporomandibular joint, unchanged from prior exam. Possible remote left zygomatic fracture. Poor dentition with multiple missing teeth, dental caries, and periapical lucencies. Orbits: Both orbits and globes are intact.  No orbital fracture. Sinuses: No sinus fracture or fluid level. Mucosal thickening in the maxillary sinuses, left greater than right. Soft tissues: Dressing overlies the right supraorbital soft tissues with small soft tissue hematoma. CT CERVICAL SPINE FINDINGS Alignment: Degenerative anterolisthesis of C3 on C4 is slightly progressed from prior exam. No traumatic subluxation. Skull base and vertebrae: No acute fracture. Vertebral body heights are maintained. The dens and skull base are intact. Chronic endplate and facet changes at multiple levels. Soft tissues and spinal canal: No prevertebral fluid or swelling. No visible canal hematoma. Disc levels: Diffuse disc space narrowing and endplate spurring. Multilevel facet arthropathy. Upper chest: No acute  finding. Other: Carotid calcifications. Left internal jugular dialysis catheter is partially included. IMPRESSION: 1. Vertex scalp laceration. No skull fracture or acute intracranial abnormality. 2. Interval evacuation of right subdural hematoma with minimal residual subdural fluid measuring less than 3 mm, likely postsurgical. 3. No acute facial bone fracture. 4. Multilevel degenerative change throughout the cervical spine without acute fracture or subluxation. Electronically Signed   By: Jeb Levering M.D.   On: 01/18/2018 05:25   Ct Cervical Spine Wo Contrast  Result Date: 01/18/2018 CLINICAL DATA:  Fall striking head on night stand. Laceration head and above right eye. EXAM: CT HEAD WITHOUT CONTRAST CT MAXILLOFACIAL WITHOUT CONTRAST CT CERVICAL SPINE WITHOUT CONTRAST TECHNIQUE: Multidetector CT imaging of the head, cervical spine, and maxillofacial  structures were performed using the standard protocol without intravenous contrast. Multiplanar CT image reconstructions of the cervical spine and maxillofacial structures were also generated. COMPARISON:  Head CT 06/04/2018. Head, face, and cervical spine CT 07/03/2017 FINDINGS: CT HEAD FINDINGS Brain: Interval evacuation of right subdural hematoma. Minimal residual subdural collection measures 3 mm. No evidence of acute hemorrhagic component. Resolution of previous midline shift. No subarachnoid, intraparenchymal, or epidural hematoma. No evidence of acute ischemia. No hydrocephalus. Generalized atrophy and mild chronic small vessel ischemia. Vascular: Atherosclerosis of skullbase vasculature without hyperdense vessel or abnormal calcification. Skull: Right great toe on a me.  No skull fracture. Other: Laceration to the midline frontal region with linear air in the scalp soft tissues. CT MAXILLOFACIAL FINDINGS Osseous: No acute fracture of the nasal bone, zygomatic arches or mandibles. Anterior translation of the left mandible at the temporomandibular joint,  unchanged from prior exam. Possible remote left zygomatic fracture. Poor dentition with multiple missing teeth, dental caries, and periapical lucencies. Orbits: Both orbits and globes are intact.  No orbital fracture. Sinuses: No sinus fracture or fluid level. Mucosal thickening in the maxillary sinuses, left greater than right. Soft tissues: Dressing overlies the right supraorbital soft tissues with small soft tissue hematoma. CT CERVICAL SPINE FINDINGS Alignment: Degenerative anterolisthesis of C3 on C4 is slightly progressed from prior exam. No traumatic subluxation. Skull base and vertebrae: No acute fracture. Vertebral body heights are maintained. The dens and skull base are intact. Chronic endplate and facet changes at multiple levels. Soft tissues and spinal canal: No prevertebral fluid or swelling. No visible canal hematoma. Disc levels: Diffuse disc space narrowing and endplate spurring. Multilevel facet arthropathy. Upper chest: No acute finding. Other: Carotid calcifications. Left internal jugular dialysis catheter is partially included. IMPRESSION: 1. Vertex scalp laceration. No skull fracture or acute intracranial abnormality. 2. Interval evacuation of right subdural hematoma with minimal residual subdural fluid measuring less than 3 mm, likely postsurgical. 3. No acute facial bone fracture. 4. Multilevel degenerative change throughout the cervical spine without acute fracture or subluxation. Electronically Signed   By: Jeb Levering M.D.   On: 01/18/2018 05:25   Ct Maxillofacial Wo Contrast  Result Date: 01/18/2018 CLINICAL DATA:  Fall striking head on night stand. Laceration head and above right eye. EXAM: CT HEAD WITHOUT CONTRAST CT MAXILLOFACIAL WITHOUT CONTRAST CT CERVICAL SPINE WITHOUT CONTRAST TECHNIQUE: Multidetector CT imaging of the head, cervical spine, and maxillofacial structures were performed using the standard protocol without intravenous contrast. Multiplanar CT image  reconstructions of the cervical spine and maxillofacial structures were also generated. COMPARISON:  Head CT 06/04/2018. Head, face, and cervical spine CT 07/03/2017 FINDINGS: CT HEAD FINDINGS Brain: Interval evacuation of right subdural hematoma. Minimal residual subdural collection measures 3 mm. No evidence of acute hemorrhagic component. Resolution of previous midline shift. No subarachnoid, intraparenchymal, or epidural hematoma. No evidence of acute ischemia. No hydrocephalus. Generalized atrophy and mild chronic small vessel ischemia. Vascular: Atherosclerosis of skullbase vasculature without hyperdense vessel or abnormal calcification. Skull: Right great toe on a me.  No skull fracture. Other: Laceration to the midline frontal region with linear air in the scalp soft tissues. CT MAXILLOFACIAL FINDINGS Osseous: No acute fracture of the nasal bone, zygomatic arches or mandibles. Anterior translation of the left mandible at the temporomandibular joint, unchanged from prior exam. Possible remote left zygomatic fracture. Poor dentition with multiple missing teeth, dental caries, and periapical lucencies. Orbits: Both orbits and globes are intact.  No orbital fracture. Sinuses: No sinus fracture or fluid  level. Mucosal thickening in the maxillary sinuses, left greater than right. Soft tissues: Dressing overlies the right supraorbital soft tissues with small soft tissue hematoma. CT CERVICAL SPINE FINDINGS Alignment: Degenerative anterolisthesis of C3 on C4 is slightly progressed from prior exam. No traumatic subluxation. Skull base and vertebrae: No acute fracture. Vertebral body heights are maintained. The dens and skull base are intact. Chronic endplate and facet changes at multiple levels. Soft tissues and spinal canal: No prevertebral fluid or swelling. No visible canal hematoma. Disc levels: Diffuse disc space narrowing and endplate spurring. Multilevel facet arthropathy. Upper chest: No acute finding. Other:  Carotid calcifications. Left internal jugular dialysis catheter is partially included. IMPRESSION: 1. Vertex scalp laceration. No skull fracture or acute intracranial abnormality. 2. Interval evacuation of right subdural hematoma with minimal residual subdural fluid measuring less than 3 mm, likely postsurgical. 3. No acute facial bone fracture. 4. Multilevel degenerative change throughout the cervical spine without acute fracture or subluxation. Electronically Signed   By: Jeb Levering M.D.   On: 01/18/2018 05:25    ROS:ROS  Blood pressure 114/73, pulse 81, temperature 98.6 F (37 C), resp. rate (!) 22, SpO2 98 %.  PHYSICAL EXAM: General appearance - alert, well appearing, and in no distress, patient awake and oriented.  No apparent neurologic deficit. Eyes - pupils equal and reactive, extraocular eye movements intact, complex 2 cm laceration vertically oriented overlying the right brow and supraorbital region Nose - normal and patent, no erythema, discharge or polyps and no active bleeding Mouth - mucous membranes moist, pharynx normal without lesions  Studies Reviewed: Maxillofacial/head CT scan  Procedure: Exploration and closure of right superior periorbital laceration  Anesthetic: 2 cc of 1% lidocaine with 1 200,000 dilution epinephrine  Risks and benefits of exploration and closure of right supra orbital laceration discussed with the patient and his wife they understand and agree with our plan for surgery which is performed at the bedside in the emergency department.  Patient's wound was injected with local anesthetic for adequate hemostasis and anesthesia.  The wound was then explored and the patient found to have a area of bleeding consistent with the right supra orbital vascular system.  This was suture ligated with 5-0 Vicryl suture.  Deep muscular layer was then closed with interrupted 4-0 & 5-0 Vicryl suture.  Skin edges closed with interrupted 5-0 Ethilon suture.  Patient  tolerated procedure without complication or difficulty.  Discharged to his nursing care facility in the company of his wife.   Assessment/Plan: Patient stable after expiration and closure of right upper lid laceration.  Tolerated procedure without complication or difficulty.  Recommend follow-up in 10 days for suture and staple removal.  Recommend laceration precautions as outlined below.  1. Limited activity 2. Liquid and soft diet, advance as tolerated 3. May bathe and shower, keep incision dry for 3 days postop 4. Wound care - 1/2 str H2O2 and bacitracin ointment twice daily 5. Elevate Head of Bed 6. Ice compress for 24 hrs  Brittie Whisnant 01/18/2018, 5:33 AM

## 2018-01-18 NOTE — Discharge Instructions (Signed)
Recommend follow-up in 10 days with Dr. Wilburn Cornelia for suture and staple removal.  Recommend laceration precautions as outlined below.   1. Limited activity 2. Liquid and soft diet, advance as tolerated 3. May bathe and shower, keep incision dry for 3 days postop 4. Wound care - 1/2 str H2O2 and bacitracin ointment twice daily 5. Elevate Head of Bed 6. Ice compress for 24 hrs  You may return to the ED for new or concerning symptoms.

## 2018-01-18 NOTE — ED Provider Notes (Signed)
[facility] EMERGENCY DEPARTMENT Provider Note   CSN: [id number] Arrival date & time: 01/18/18  0253     History   Chief Complaint Chief Complaint  Patient presents with  . Fall  . Laceration    HPI Albert Stanley is a 72 y.o. male.   72 year old male with a history of CHF, NICM, diabetes, ESRD on dialysis, dyslipidemia, hypertension presents to the emergency department after a fall at [facility].  He states that he was trying to get to his wheelchair when he lost his balance.  Patient struck his head on the nightstand.  He had no loss of consciousness.  He is not on chronic anticoagulation.  He states that he has balance difficulties at baseline.  He has recently been seen in the emergency department for recurrent falls.  He was admitted in early March for a traumatic subdural hematoma.  No subsequent nausea or vomiting.  No extremity numbness, paresthesias, weakness.     Past Medical History:  Diagnosis Date  . Arthritis    Osteoarthritis  . Asthma   . Bladder cancer ([facility]) dx'd 1990   surg only  . CHF (congestive heart failure) ([place])   . Diabetes mellitus without complication ([place])   . ESRD (end stage renal disease) ([name])    Tues, Thurs, Sat dialysis  . GERD (gastroesophageal reflux disease)   . History of cardiac catheterization    a. LHC 5/17: no obstructive CAD  . History of DVT (deep vein thrombosis)    2003 ish  . History of nuclear stress test    a. Myoview 4/17: EF 39%, inf, inf-lat, apical inf, apical lat, apical scar, intermediate risk  . Hyperlipidemia   . Hypertension   . NICM (nonischemic cardiomyopathy) ([place])    a. Echo 2/17: mod LVH, EF 35-40%, inf-lat and inf-sept HK, mod MR, severe LAE, small pericardial effusion    Patient Active Problem List   Diagnosis Date Noted  . Hematuria 01/06/2018  . Labile blood pressure   . Essential hypertension   . Acute on chronic diastolic heart failure ([place])   . Type 2 diabetes mellitus with  peripheral neuropathy ([name])   . Acute deep vein thrombosis (DVT) of femoral vein of right lower extremity ([place])   . Traumatic subdural hematoma ([place]) 12/16/2017  . ESRD (end stage renal disease) ([place])   . ESRD (end stage renal disease) on dialysis ([place])   . Dysphagia   . Acute blood loss anemia   . Anemia of chronic disease   . Chronic diastolic congestive heart failure ([employer])   . Diabetes mellitus type 2 in nonobese ([name])   . Benign essential HTN   . Nonischemic cardiomyopathy ([place])   . Transaminitis   . Encounter for central line placement   . Endotracheally intubated   . Ventilator dependent ([place])   . Chronic renal impairment   . Chronic in-center hemodialysis status ([place])   . S/P craniotomy 12/05/2017  . Hyperkalemia 12/02/2017  . Malnutrition of moderate degree 12/18/2016  . PVC's (premature ventricular contractions)   . Anemia associated with chronic renal failure   . End-stage renal disease on hemodialysis ([place]) 12/09/2016  . Dyslipidemia associated with type 2 diabetes mellitus ([place]) 12/09/2016  . Acute on chronic systolic and diastolic heart failure, NYHA class 1 ([place]) 12/09/2016  . 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 Type 2 diabetes mellitus ([place]) 12/11/2015  . Fluid overload 12/06/2015  . History of DVT (  deep vein thrombosis) 12/04/2015  . Renal hematoma 12/04/2015  . H/O: gout 11/16/2013  . Trochanteric bursitis 10/17/2013  . 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 Lumbar degenerative disc disease 07/17/2013    Past Surgical History:  Procedure Laterality Date  . arthroscopic knee surgery Left   . AV FISTULA PLACEMENT Left 12/13/2015   Procedure: ARTERIOVENOUS (AV) FISTULA CREATION;  Surgeon: Angelia Mould, MD;  Location[facility] OR;  Service: Vascular;  Laterality: Left;  . AV FISTULA PLACEMENT Left 12/15/2017   Procedure: INSERTION OF ARTERIOVENOUS (AV) GORE-TEX GRAFT ARM;  Surgeon: Serafina Mitchell, MD;   Location: [facility];  Service: Vascular;  Laterality: Left;  . BACK SURGERY     2 times  1960  . CARDIAC CATHETERIZATION N/A 02/28/2016   Procedure: Left Heart Cath and Coronary Angiography;  Surgeon: Larey Dresser, MD;  Location: [place] CV LAB;  Service: Cardiovascular;  Laterality: N/A;  . CRANIOTOMY N/A 12/05/2017   Procedure: CRANIOTOMY HEMATOMA EVACUATION SUBDURAL;  Surgeon: Ashok Pall, MD;  Location: [place];  Service: Neurosurgery;  Laterality: N/A;  . CYSTOSCOPY    . FISTULA SUPERFICIALIZATION Left 08/14/2016   Procedure: FISTULA SUPERFICIALIZATION LEFT UPPER ARM;  Surgeon: Angelia Mould, MD;  Location: [place];  Service: Vascular;  Laterality: Left;  . INSERTION OF DIALYSIS CATHETER N/A 12/13/2015   Procedure: INSERTION OF DIALYSIS CATHETER;  Surgeon: Angelia Mould, MD;  Location: [place];  Service: Vascular;  Laterality: N/A;  . INSERTION OF DIALYSIS CATHETER Left 05/07/2017   Procedure: INSERTION OF DIALYSIS CATHETER LEFT INTERNAL JUGULAR PLACEMENT;  Surgeon: Angelia Mould, MD;  Location: [place];  Service: Vascular;  Laterality: Left;  . INSERTION OF DIALYSIS CATHETER Left 12/15/2017   Procedure: INSERTION OF DIALYSIS CATHETER;  Surgeon: Serafina Mitchell, MD;  Location: [facility] OR;  Service: Vascular;  Laterality: Left;  . IR IVC FILTER PLMT / S&I Burke Keels GUID/MOD SED  12/18/2017  . IR RADIOLOGIST EVAL & MGMT  05/12/2017  . JOINT REPLACEMENT Left    hip  . LIGATION OF COMPETING BRANCHES OF ARTERIOVENOUS FISTULA Left 08/14/2016   Procedure: LIGATION OF COMPETING BRANCHES OF ARTERIOVENOUS FISTULA LEFT UPPER ARM;  Surgeon: Angelia Mould, MD;  Location: [place];  Service: Vascular;  Laterality: Left;  . PERIPHERAL VASCULAR CATHETERIZATION Left 06/09/2016   Procedure: Fistulagram;  Surgeon: Serafina Mitchell, MD;  Location: [place] CV LAB;  Service: Cardiovascular;  Laterality: Left;  ARM  . PERIPHERAL VASCULAR CATHETERIZATION Left 06/09/2016   Procedure: Peripheral Vascular  Balloon Angioplasty;  Surgeon: Serafina Mitchell, MD;  Location: [place] [facility];  Service: Cardiovascular;  Laterality: Left;  upper arm venous  . PERIPHERAL VASCULAR CATHETERIZATION Left 06/25/2016   Procedure: Fistulagram;  Surgeon: Serafina Mitchell, MD;  Location: [place] [facility];  Service: Cardiovascular;  Laterality: Left;  . REVISON OF ARTERIOVENOUS FISTULA Left 05/07/2017   Procedure: REPAIR OF PSEUDOANEURYSM  LEFT BRACHIO-CEPHALIC  ARM ARTERIOVENOUS FISTULA;  Surgeon: Angelia Mould, MD;  Location: [place];  Service: Vascular;  Laterality: Left;        Home Medications    Prior to Admission medications   Medication Sig Start Date End Date Taking? Authorizing Provider  allopurinol (ZYLOPRIM) 300 MG tablet Take 300 mg by mouth daily.    Yes [provider]  Amino Acids-Protein Hydrolys (FEEDING SUPPLEMENT, PRO-STAT SUGAR FREE 64,) LIQD Take 30 mLs by mouth 2 (two) times daily.   Yes [provider]  amiodarone (PACERONE) 200 MG tablet  Take 1 tablet (200 mg total) by mouth daily. 09/24/17  Yes Larey Dresser, MD  bisacodyl (DULCOLAX) 10 MG suppository Place 10 mg rectally as needed for moderate constipation.   Yes [provider]  bisacodyl (DULCOLAX) 5 MG EC tablet Take 5 mg by mouth at bedtime.   Yes [provider]  diclofenac sodium (VOLTAREN) 1 % GEL Apply 2 g topically 3 (three) times daily. Apply to lower back, left hip and knee   Yes [provider]  lactulose (CHRONULAC) 10 GM/15ML solution Take 10 g by mouth daily as needed for mild constipation.   Yes [provider]  Melatonin 3 MG TABS Take 6 mg by mouth at bedtime.   Yes [provider]  multivitamin (RENA-VIT) TABS tablet Take 1 tablet by mouth at bedtime.  12/20/15  Yes [provider]  oxybutynin (DITROPAN) 5 MG tablet Take 5 mg by mouth 2 (two) times daily.   Yes [provider]  oxyCODONE-acetaminophen (PERCOCET) 7.5-325 MG tablet  Take 1 tablet by mouth every 6 (six) hours as needed for moderate pain. 01/07/18  Yes Medina-Vargas, Monina C, NP  pantoprazole (PROTONIX) 40 MG tablet Take 40 mg by mouth at bedtime.   Yes [provider]  polyethylene glycol (MIRALAX / GLYCOLAX) packet Take 17 g by mouth daily. Hold for loose stool   Yes [provider]  sevelamer carbonate (RENVELA) 800 MG tablet Take 2 tablets (1,600 mg total) by mouth 3 (three) times daily with meals. 12/18/15  Yes Vann, Jessica U, DO  Sodium Phosphates (RA SALINE ENEMA) 19-7 GM/118ML ENEM Place 1 each rectally as needed (for constipation).   Yes [provider]  bacitracin ointment Apply 1 application topically 2 (two) times daily. 01/18/18   Antonietta Breach, PA-C  calcitRIOL (ROCALTROL) 0.5 MCG capsule Take 1 capsule (0.5 mcg total) by mouth Every Tuesday,Thursday,and Saturday with dialysis. Medication given at dialysis on Tues Thurs Sat Patient not taking: Reported on 01/18/2018 01/04/18   Angiulli, Lavon Paganini, PA-C  oxybutynin [facility]) 5 MG/5ML syrup Take 5 mLs (5 mg total) by mouth 2 (two) times daily. Patient not taking: Reported on 01/18/2018 01/04/18   Angiulli, Lavon Paganini, PA-C    Family History Family History  Problem Relation Age of Onset  . Arthritis Mother   . Cancer Father   . Heart disease Father   . Hypertension Sister   . Alcohol abuse Brother   . Diabetes Daughter   . Heart attack Paternal Uncle     Social History Social History   Tobacco Use  . Smoking status: Former Smoker    Years: 30.00    Last attempt to quit: 10/12/1998    Years since quitting: 19.2  . Smokeless tobacco: Never Used  Substance Use Topics  . Alcohol use: No    Alcohol/week: 0.0 oz    Comment: Used to drink on the weekends, quit 2 years ago  . Drug use: No     Allergies   Meperidine; Penicillins; Sulfa antibiotics; Sulfonamide derivatives; Lac bovis; Lactose intolerance (gi); Milk-related compounds; Foltx [elfolate plus]; Betadine [povidone  iodine]; and Eggs or egg-derived products   Review of Systems Review of Systems Ten systems reviewed and are negative for acute change, except as noted in the HPI.    Physical Exam Updated Vital Signs BP 114/73   Pulse 81   Temp 98.6 F (37 C)   Resp (!) 22   SpO2 98%   Physical Exam  Constitutional: He is oriented to  person, place, and time. He appears well-developed and well-nourished. No distress.  Nontoxic appearing.  HENT:  Head: Normocephalic. Head is with laceration.    Right Ear: External ear normal.  Left Ear: External ear normal.  Eyes: Conjunctivae and EOM are normal. No scleral icterus.  Neck:  Towel wrapped around neck.  Cardiovascular: Normal rate, regular rhythm and intact distal pulses.  Pulmonary/Chest: Effort normal. No stridor. No respiratory distress.  Respirations even and unlabored  Musculoskeletal: Normal range of motion.  Neurological: He is alert and oriented to person, place, and time. He exhibits normal muscle tone. Coordination normal.  GCS 15. Speech is goal oriented. Patient moving all extremities spontaneously.  Skin: Skin is warm and dry. No rash noted. He is not diaphoretic. No erythema. No pallor.  Psychiatric: He has a normal mood and affect. His behavior is normal.  Nursing note and vitals reviewed.    ED Treatments / Results  Labs (all labs ordered are listed, but only abnormal results are displayed) Labs Reviewed  PROTIME-INR - Abnormal; Notable for the following components:      Result Value   Prothrombin Time 18.8 (*)    All other components within normal limits  BASIC METABOLIC PANEL - Abnormal; Notable for the following components:   Sodium 132 (*)    Chloride 93 (*)    Glucose, Bld 128 (*)    BUN 44 (*)    Creatinine, Ser 7.62 (*)    GFR calc non Af Amer 6 (*)    GFR calc Af Amer 7 (*)    All other components within normal limits  CBC WITH DIFFERENTIAL/PLATELET    EKG EKG Interpretation  Date/Time:  Tuesday  January 18 2018 03:00:57 EDT Ventricular Rate:  81 PR Interval:    QRS Duration: 105 QT Interval:  424 QTC Calculation: 493 R Axis:   -8 Text Interpretation:  Sinus arrhythmia Multiple ventricular premature complexes Borderline abnrm T, anterolateral leads Borderline prolonged QT interval Confirmed by Thayer Jew [phone]) on 01/18/2018 4:42:25 AM   Radiology Ct Head Wo Contrast  Result Date: 01/18/2018 CLINICAL DATA:  Fall striking head on night stand. Laceration head and above right eye. EXAM: CT HEAD WITHOUT CONTRAST CT MAXILLOFACIAL WITHOUT CONTRAST CT CERVICAL SPINE WITHOUT CONTRAST TECHNIQUE: Multidetector CT imaging of the head, cervical spine, and maxillofacial structures were performed using the standard protocol without intravenous contrast. Multiplanar CT image reconstructions of the cervical spine and maxillofacial structures were also generated. COMPARISON:  Head CT 06/04/2018. Head, face, and cervical spine CT 07/03/2017 FINDINGS: CT HEAD FINDINGS Brain: Interval evacuation of right subdural hematoma. Minimal residual subdural collection measures 3 mm. No evidence of acute hemorrhagic component. Resolution of previous midline shift. No subarachnoid, intraparenchymal, or epidural hematoma. No evidence of acute ischemia. No hydrocephalus. Generalized atrophy and mild chronic small vessel ischemia. Vascular: Atherosclerosis of skullbase vasculature without hyperdense vessel or abnormal calcification. Skull: Right great toe on a me.  No skull fracture. Other: Laceration to the midline frontal region with linear air in the scalp soft tissues. CT MAXILLOFACIAL FINDINGS Osseous: No acute fracture of the nasal bone, zygomatic arches or mandibles. Anterior translation of the left mandible at the temporomandibular joint, unchanged from prior exam. Possible remote left zygomatic fracture. Poor dentition with multiple missing teeth, dental caries, and periapical lucencies. Orbits: Both orbits and globes  are intact.  No orbital fracture. Sinuses: No sinus fracture or fluid level. Mucosal thickening in the maxillary sinuses, left greater than right. Soft tissues: Dressing overlies the right  supraorbital soft tissues with small soft tissue hematoma. CT CERVICAL SPINE FINDINGS Alignment: Degenerative anterolisthesis of C3 on C4 is slightly progressed from prior exam. No traumatic subluxation. Skull base and vertebrae: No acute fracture. Vertebral body heights are maintained. The dens and skull base are intact. Chronic endplate and facet changes at multiple levels. Soft tissues and spinal canal: No prevertebral fluid or swelling. No visible canal hematoma. Disc levels: Diffuse disc space narrowing and endplate spurring. Multilevel facet arthropathy. Upper chest: No acute finding. Other: Carotid calcifications. Left internal jugular dialysis catheter is partially included. IMPRESSION: 1. Vertex scalp laceration. No skull fracture or acute intracranial abnormality. 2. Interval evacuation of right subdural hematoma with minimal residual subdural fluid measuring less than 3 mm, likely postsurgical. 3. No acute facial bone fracture. 4. Multilevel degenerative change throughout the cervical spine without acute fracture or subluxation. Electronically Signed   By: Jeb Levering M.D.   On: 01/18/2018 05:25   Ct Cervical Spine Wo Contrast  Result Date: 01/18/2018 CLINICAL DATA:  Fall striking head on night stand. Laceration head and above right eye. EXAM: CT HEAD WITHOUT CONTRAST CT MAXILLOFACIAL WITHOUT CONTRAST CT CERVICAL SPINE WITHOUT CONTRAST TECHNIQUE: Multidetector CT imaging of the head, cervical spine, and maxillofacial structures were performed using the standard protocol without intravenous contrast. Multiplanar CT image reconstructions of the cervical spine and maxillofacial structures were also generated. COMPARISON:  Head CT 06/04/2018. Head, face, and cervical spine CT 07/03/2017 FINDINGS: CT HEAD FINDINGS  Brain: Interval evacuation of right subdural hematoma. Minimal residual subdural collection measures 3 mm. No evidence of acute hemorrhagic component. Resolution of previous midline shift. No subarachnoid, intraparenchymal, or epidural hematoma. No evidence of acute ischemia. No hydrocephalus. Generalized atrophy and mild chronic small vessel ischemia. Vascular: Atherosclerosis of skullbase vasculature without hyperdense vessel or abnormal calcification. Skull: Right great toe on a me.  No skull fracture. Other: Laceration to the midline frontal region with linear air in the scalp soft tissues. CT MAXILLOFACIAL FINDINGS Osseous: No acute fracture of the nasal bone, zygomatic arches or mandibles. Anterior translation of the left mandible at the temporomandibular joint, unchanged from prior exam. Possible remote left zygomatic fracture. Poor dentition with multiple missing teeth, dental caries, and periapical lucencies. Orbits: Both orbits and globes are intact.  No orbital fracture. Sinuses: No sinus fracture or fluid level. Mucosal thickening in the maxillary sinuses, left greater than right. Soft tissues: Dressing overlies the right supraorbital soft tissues with small soft tissue hematoma. CT CERVICAL SPINE FINDINGS Alignment: Degenerative anterolisthesis of C3 on C4 is slightly progressed from prior exam. No traumatic subluxation. Skull base and vertebrae: No acute fracture. Vertebral body heights are maintained. The dens and skull base are intact. Chronic endplate and facet changes at multiple levels. Soft tissues and spinal canal: No prevertebral fluid or swelling. No visible canal hematoma. Disc levels: Diffuse disc space narrowing and endplate spurring. Multilevel facet arthropathy. Upper chest: No acute finding. Other: Carotid calcifications. Left internal jugular dialysis catheter is partially included. IMPRESSION: 1. Vertex scalp laceration. No skull fracture or acute intracranial abnormality. 2. Interval  evacuation of right subdural hematoma with minimal residual subdural fluid measuring less than 3 mm, likely postsurgical. 3. No acute facial bone fracture. 4. Multilevel degenerative change throughout the cervical spine without acute fracture or subluxation. Electronically Signed   By: Jeb Levering M.D.   On: 01/18/2018 05:25   Ct Maxillofacial Wo Contrast  Result Date: 01/18/2018 CLINICAL DATA:  Fall striking head on night stand. Laceration head and above  right eye. EXAM: CT HEAD WITHOUT CONTRAST CT MAXILLOFACIAL WITHOUT CONTRAST CT CERVICAL SPINE WITHOUT CONTRAST TECHNIQUE: Multidetector CT imaging of the head, cervical spine, and maxillofacial structures were performed using the standard protocol without intravenous contrast. Multiplanar CT image reconstructions of the cervical spine and maxillofacial structures were also generated. COMPARISON:  Head CT 06/04/2018. Head, face, and cervical spine CT 07/03/2017 FINDINGS: CT HEAD FINDINGS Brain: Interval evacuation of right subdural hematoma. Minimal residual subdural collection measures 3 mm. No evidence of acute hemorrhagic component. Resolution of previous midline shift. No subarachnoid, intraparenchymal, or epidural hematoma. No evidence of acute ischemia. No hydrocephalus. Generalized atrophy and mild chronic small vessel ischemia. Vascular: Atherosclerosis of skullbase vasculature without hyperdense vessel or abnormal calcification. Skull: Right great toe on a me.  No skull fracture. Other: Laceration to the midline frontal region with linear air in the scalp soft tissues. CT MAXILLOFACIAL FINDINGS Osseous: No acute fracture of the nasal bone, zygomatic arches or mandibles. Anterior translation of the left mandible at the temporomandibular joint, unchanged from prior exam. Possible remote left zygomatic fracture. Poor dentition with multiple missing teeth, dental caries, and periapical lucencies. Orbits: Both orbits and globes are intact.  No orbital  fracture. Sinuses: No sinus fracture or fluid level. Mucosal thickening in the maxillary sinuses, left greater than right. Soft tissues: Dressing overlies the right supraorbital soft tissues with small soft tissue hematoma. CT CERVICAL SPINE FINDINGS Alignment: Degenerative anterolisthesis of C3 on C4 is slightly progressed from prior exam. No traumatic subluxation. Skull base and vertebrae: No acute fracture. Vertebral body heights are maintained. The dens and skull base are intact. Chronic endplate and facet changes at multiple levels. Soft tissues and spinal canal: No prevertebral fluid or swelling. No visible canal hematoma. Disc levels: Diffuse disc space narrowing and endplate spurring. Multilevel facet arthropathy. Upper chest: No acute finding. Other: Carotid calcifications. Left internal jugular dialysis catheter is partially included. IMPRESSION: 1. Vertex scalp laceration. No skull fracture or acute intracranial abnormality. 2. Interval evacuation of right subdural hematoma with minimal residual subdural fluid measuring less than 3 mm, likely postsurgical. 3. No acute facial bone fracture. 4. Multilevel degenerative change throughout the cervical spine without acute fracture or subluxation. Electronically Signed   By: Jeb Levering M.D.   On: 01/18/2018 05:25    Procedures Procedures (including critical care time)  LACERATION REPAIR Performed by: Antonietta Breach Authorized by: Antonietta Breach Consent: Verbal consent obtained. Risks and benefits: risks, benefits and alternatives were discussed Consent given by: patient Patient identity confirmed: provided demographic data Prepped and Draped in normal sterile fashion Wound explored  Laceration Location: scalp, parietal  Laceration Length: 7cm  No Foreign Bodies seen or palpated  Irrigation method: syringe Amount of cleaning: standard  Skin closure: staples  Number of sutures: 5  Technique: simple  Patient tolerance: Patient  tolerated the procedure well with no immediate complications.   Medications Ordered in ED Medications  lidocaine-EPINEPHrine (XYLOCAINE W/EPI) 2 %-1:200000 (PF) injection 10 mL (10 mLs Infiltration Given 01/18/18 0356)     Initial Impression / Assessment and Plan / ED Course  I have reviewed the triage vital signs and the nursing notes.  Pertinent labs & imaging results that were available during my care of the patient were reviewed by me and considered in my medical decision making (see chart for details).      3:54 AM Patient presenting from a nursing facility following a fall.  He has a vertical laceration to the medial aspect of his right eyebrow  which extends to the forehead.  There is brisk, pulsatile bleeding from the site suspicious for arterial injury; suspect supraorbital arterial injury.  Unable to visually determine cause of bleeding as it extends below the suture line, at and slightly below the orbital rim.  Attempted multiple figure-of-eight subcutaneous sutures with Dr. Dina Rich at bedside.  Area also injected with lidocaine with epinephrine.  Wound was closed and combat gauze applied with pressure dressing.   4:17 AM Case discussed with Dr. Wilburn Cornelia who will evaluate in the ED.  5:34 AM Laceration repaired at bedside by Dr. Wilburn Cornelia; hemostasis achieved and wound thoroughly explored. See note by Dr. Wilburn Cornelia for additional detail.  CTs reviewed and negative for emergent intracranial injury.  5:49 AM CBC clotted.  Will redraw.  Anticipate discharge back to [facility] via [facility] if reassuring.    Final Clinical Impressions(s) / ED Diagnoses   Final diagnoses:  Facial laceration, initial encounter  Periorbital hematoma of right eye  Fall, initial encounter    ED Discharge Orders        Ordered    bacitracin ointment  2 times daily     01/18/18 0552       Antonietta Breach, PA-C 01/18/18 0034    Merryl Hacker, MD 01/18/18 [phone]

## 2018-01-18 NOTE — ED Triage Notes (Signed)
Pt coming from [facility] by ems. Pt was leaning on bed when he leaned to far and fell and hit his head on the night stand. Pt is axox4. No LOC. Pt does have 3 inch lac to the top of the head bleeding is controlled and 1 inch lac above the right eye bleeding not controlled.

## 2018-02-03 ENCOUNTER — Encounter (HOSPITAL_COMMUNITY): Payer: Self-pay | Admitting: *Deleted

## 2018-02-03 ENCOUNTER — Other Ambulatory Visit: Payer: Self-pay

## 2018-02-03 ENCOUNTER — Inpatient Hospital Stay (HOSPITAL_COMMUNITY): Payer: Medicare Other

## 2018-02-03 ENCOUNTER — Emergency Department (HOSPITAL_COMMUNITY): Payer: Medicare Other

## 2018-02-03 ENCOUNTER — Inpatient Hospital Stay (HOSPITAL_COMMUNITY)
Admission: EM | Admit: 2018-02-03 | Discharge: 2018-02-07 | DRG: 638 | Disposition: A | Discharge status: Home Health | Payer: Medicare Other | Attending: Family Medicine | Admitting: Family Medicine

## 2018-02-03 DIAGNOSIS — Z7984 Long term (current) use of oral hypoglycemic drugs: Secondary | ICD-10-CM

## 2018-02-03 DIAGNOSIS — E119 Type 2 diabetes mellitus without complications: Secondary | ICD-10-CM

## 2018-02-03 DIAGNOSIS — I10 Essential (primary) hypertension: Secondary | ICD-10-CM | POA: Diagnosis present

## 2018-02-03 DIAGNOSIS — H18419 Arcus senilis, unspecified eye: Secondary | ICD-10-CM | POA: Diagnosis present

## 2018-02-03 DIAGNOSIS — Y92009 Unspecified place in unspecified non-institutional (private) residence as the place of occurrence of the external cause: Secondary | ICD-10-CM

## 2018-02-03 DIAGNOSIS — I953 Hypotension of hemodialysis: Secondary | ICD-10-CM | POA: Diagnosis not present

## 2018-02-03 DIAGNOSIS — R197 Diarrhea, unspecified: Secondary | ICD-10-CM | POA: Diagnosis present

## 2018-02-03 DIAGNOSIS — Z992 Dependence on renal dialysis: Secondary | ICD-10-CM

## 2018-02-03 DIAGNOSIS — M25562 Pain in left knee: Secondary | ICD-10-CM | POA: Diagnosis present

## 2018-02-03 DIAGNOSIS — D631 Anemia in chronic kidney disease: Secondary | ICD-10-CM | POA: Diagnosis not present

## 2018-02-03 DIAGNOSIS — I340 Nonrheumatic mitral (valve) insufficiency: Secondary | ICD-10-CM | POA: Diagnosis present

## 2018-02-03 DIAGNOSIS — N329 Bladder disorder, unspecified: Secondary | ICD-10-CM | POA: Diagnosis present

## 2018-02-03 DIAGNOSIS — R531 Weakness: Secondary | ICD-10-CM | POA: Diagnosis present

## 2018-02-03 DIAGNOSIS — M1990 Unspecified osteoarthritis, unspecified site: Secondary | ICD-10-CM | POA: Diagnosis present

## 2018-02-03 DIAGNOSIS — Z95828 Presence of other vascular implants and grafts: Secondary | ICD-10-CM

## 2018-02-03 DIAGNOSIS — Z882 Allergy status to sulfonamides status: Secondary | ICD-10-CM

## 2018-02-03 DIAGNOSIS — E11649 Type 2 diabetes mellitus with hypoglycemia without coma: Principal | ICD-10-CM | POA: Diagnosis present

## 2018-02-03 DIAGNOSIS — E160 Drug-induced hypoglycemia without coma: Secondary | ICD-10-CM | POA: Diagnosis present

## 2018-02-03 DIAGNOSIS — Z8261 Family history of arthritis: Secondary | ICD-10-CM

## 2018-02-03 DIAGNOSIS — T383X5A Adverse effect of insulin and oral hypoglycemic [antidiabetic] drugs, initial encounter: Secondary | ICD-10-CM | POA: Diagnosis present

## 2018-02-03 DIAGNOSIS — Z8551 Personal history of malignant neoplasm of bladder: Secondary | ICD-10-CM

## 2018-02-03 DIAGNOSIS — E785 Hyperlipidemia, unspecified: Secondary | ICD-10-CM | POA: Diagnosis present

## 2018-02-03 DIAGNOSIS — Z22322 Carrier or suspected carrier of Methicillin resistant Staphylococcus aureus: Secondary | ICD-10-CM

## 2018-02-03 DIAGNOSIS — J45909 Unspecified asthma, uncomplicated: Secondary | ICD-10-CM | POA: Diagnosis present

## 2018-02-03 DIAGNOSIS — I5042 Chronic combined systolic (congestive) and diastolic (congestive) heart failure: Secondary | ICD-10-CM | POA: Diagnosis present

## 2018-02-03 DIAGNOSIS — Z811 Family history of alcohol abuse and dependence: Secondary | ICD-10-CM

## 2018-02-03 DIAGNOSIS — R339 Retention of urine, unspecified: Secondary | ICD-10-CM | POA: Diagnosis present

## 2018-02-03 DIAGNOSIS — Z87891 Personal history of nicotine dependence: Secondary | ICD-10-CM

## 2018-02-03 DIAGNOSIS — Z880 Allergy status to penicillin: Secondary | ICD-10-CM

## 2018-02-03 DIAGNOSIS — R34 Anuria and oliguria: Secondary | ICD-10-CM | POA: Diagnosis present

## 2018-02-03 DIAGNOSIS — Z9889 Other specified postprocedural states: Secondary | ICD-10-CM

## 2018-02-03 DIAGNOSIS — G8929 Other chronic pain: Secondary | ICD-10-CM | POA: Diagnosis present

## 2018-02-03 DIAGNOSIS — N189 Chronic kidney disease, unspecified: Secondary | ICD-10-CM | POA: Diagnosis present

## 2018-02-03 DIAGNOSIS — S0011XA Contusion of right eyelid and periocular area, initial encounter: Secondary | ICD-10-CM | POA: Diagnosis present

## 2018-02-03 DIAGNOSIS — I132 Hypertensive heart and chronic kidney disease with heart failure and with stage 5 chronic kidney disease, or end stage renal disease: Secondary | ICD-10-CM | POA: Diagnosis present

## 2018-02-03 DIAGNOSIS — X58XXXA Exposure to other specified factors, initial encounter: Secondary | ICD-10-CM | POA: Diagnosis present

## 2018-02-03 DIAGNOSIS — I428 Other cardiomyopathies: Secondary | ICD-10-CM | POA: Diagnosis present

## 2018-02-03 DIAGNOSIS — R0989 Other specified symptoms and signs involving the circulatory and respiratory systems: Secondary | ICD-10-CM | POA: Diagnosis present

## 2018-02-03 DIAGNOSIS — I493 Ventricular premature depolarization: Secondary | ICD-10-CM | POA: Diagnosis present

## 2018-02-03 DIAGNOSIS — E162 Hypoglycemia, unspecified: Secondary | ICD-10-CM | POA: Diagnosis not present

## 2018-02-03 DIAGNOSIS — Z888 Allergy status to other drugs, medicaments and biological substances status: Secondary | ICD-10-CM

## 2018-02-03 DIAGNOSIS — Z86718 Personal history of other venous thrombosis and embolism: Secondary | ICD-10-CM

## 2018-02-03 DIAGNOSIS — I272 Pulmonary hypertension, unspecified: Secondary | ICD-10-CM | POA: Diagnosis present

## 2018-02-03 DIAGNOSIS — E1122 Type 2 diabetes mellitus with diabetic chronic kidney disease: Secondary | ICD-10-CM | POA: Diagnosis present

## 2018-02-03 DIAGNOSIS — S0083XA Contusion of other part of head, initial encounter: Secondary | ICD-10-CM | POA: Diagnosis present

## 2018-02-03 DIAGNOSIS — Z8249 Family history of ischemic heart disease and other diseases of the circulatory system: Secondary | ICD-10-CM

## 2018-02-03 DIAGNOSIS — I5032 Chronic diastolic (congestive) heart failure: Secondary | ICD-10-CM | POA: Diagnosis present

## 2018-02-03 DIAGNOSIS — E739 Lactose intolerance, unspecified: Secondary | ICD-10-CM | POA: Diagnosis present

## 2018-02-03 DIAGNOSIS — Z79899 Other long term (current) drug therapy: Secondary | ICD-10-CM

## 2018-02-03 DIAGNOSIS — K219 Gastro-esophageal reflux disease without esophagitis: Secondary | ICD-10-CM | POA: Diagnosis present

## 2018-02-03 DIAGNOSIS — Z6831 Body mass index (BMI) 31.0-31.9, adult: Secondary | ICD-10-CM

## 2018-02-03 DIAGNOSIS — Z91012 Allergy to eggs: Secondary | ICD-10-CM | POA: Diagnosis not present

## 2018-02-03 DIAGNOSIS — N2581 Secondary hyperparathyroidism of renal origin: Secondary | ICD-10-CM | POA: Diagnosis present

## 2018-02-03 DIAGNOSIS — Z9981 Dependence on supplemental oxygen: Secondary | ICD-10-CM

## 2018-02-03 DIAGNOSIS — N186 End stage renal disease: Secondary | ICD-10-CM | POA: Diagnosis not present

## 2018-02-03 DIAGNOSIS — Z452 Encounter for adjustment and management of vascular access device: Secondary | ICD-10-CM | POA: Diagnosis not present

## 2018-02-03 DIAGNOSIS — T383X1A Poisoning by insulin and oral hypoglycemic [antidiabetic] drugs, accidental (unintentional), initial encounter: Secondary | ICD-10-CM

## 2018-02-03 DIAGNOSIS — Z833 Family history of diabetes mellitus: Secondary | ICD-10-CM

## 2018-02-03 DIAGNOSIS — E669 Obesity, unspecified: Secondary | ICD-10-CM | POA: Diagnosis present

## 2018-02-03 DIAGNOSIS — E8889 Other specified metabolic disorders: Secondary | ICD-10-CM | POA: Diagnosis present

## 2018-02-03 DIAGNOSIS — I447 Left bundle-branch block, unspecified: Secondary | ICD-10-CM | POA: Diagnosis present

## 2018-02-03 LAB — CBC WITH DIFFERENTIAL/PLATELET
Basophils Absolute: 0 10*3/uL (ref 0.0–0.1)
Basophils Relative: 0 %
Eosinophils Absolute: 0.2 10*3/uL (ref 0.0–0.7)
Eosinophils Relative: 4 %
HEMATOCRIT: 25.1 % — AB (ref 39.0–52.0)
HEMOGLOBIN: 8.5 g/dL — AB (ref 13.0–17.0)
LYMPHS ABS: 1 10*3/uL (ref 0.7–4.0)
LYMPHS PCT: 17 %
MCH: 30.1 pg (ref 26.0–34.0)
MCHC: 33.9 g/dL (ref 30.0–36.0)
MCV: 89 fL (ref 78.0–100.0)
MONOS PCT: 7 %
Monocytes Absolute: 0.4 10*3/uL (ref 0.1–1.0)
NEUTROS ABS: 4 10*3/uL (ref 1.7–7.7)
Neutrophils Relative %: 72 %
Platelets: 135 10*3/uL — ABNORMAL LOW (ref 150–400)
RBC: 2.82 MIL/uL — ABNORMAL LOW (ref 4.22–5.81)
RDW: 17.3 % — ABNORMAL HIGH (ref 11.5–15.5)
WBC: 5.7 10*3/uL (ref 4.0–10.5)

## 2018-02-03 LAB — CBG MONITORING, ED
GLUCOSE-CAPILLARY: 16 mg/dL — AB (ref 65–99)
GLUCOSE-CAPILLARY: 16 mg/dL — AB (ref 65–99)
GLUCOSE-CAPILLARY: 24 mg/dL — AB (ref 65–99)
GLUCOSE-CAPILLARY: 32 mg/dL — AB (ref 65–99)
GLUCOSE-CAPILLARY: 36 mg/dL — AB (ref 65–99)
GLUCOSE-CAPILLARY: 39 mg/dL — AB (ref 65–99)
GLUCOSE-CAPILLARY: 40 mg/dL — AB (ref 65–99)
GLUCOSE-CAPILLARY: 65 mg/dL (ref 65–99)
GLUCOSE-CAPILLARY: 71 mg/dL (ref 65–99)
Glucose-Capillary: 20 mg/dL — CL (ref 65–99)
Glucose-Capillary: 44 mg/dL — CL (ref 65–99)
Glucose-Capillary: 24 mg/dL — CL (ref 65–99)
Glucose-Capillary: 74 mg/dL (ref 65–99)
Glucose-Capillary: 33 mg/dL — CL (ref 65–99)
Glucose-Capillary: 58 mg/dL — ABNORMAL LOW (ref 65–99)
Glucose-Capillary: 19 mg/dL — CL (ref 65–99)
Glucose-Capillary: 45 mg/dL — ABNORMAL LOW (ref 65–99)
Glucose-Capillary: 66 mg/dL (ref 65–99)
Glucose-Capillary: 61 mg/dL — ABNORMAL LOW (ref 65–99)

## 2018-02-03 LAB — I-STAT TROPONIN, ED: Troponin i, poc: 0.2 ng/mL (ref 0.00–0.08)

## 2018-02-03 LAB — BASIC METABOLIC PANEL
ANION GAP: 21 — AB (ref 5–15)
Anion gap: 19 — ABNORMAL HIGH (ref 5–15)
BUN: 72 mg/dL — ABNORMAL HIGH (ref 6–20)
BUN: 73 mg/dL — AB (ref 6–20)
CALCIUM: 7.5 mg/dL — AB (ref 8.9–10.3)
CHLORIDE: 100 mmol/L — AB (ref 101–111)
CO2: 16 mmol/L — AB (ref 22–32)
CO2: 16 mmol/L — ABNORMAL LOW (ref 22–32)
CREATININE: 10.69 mg/dL — AB (ref 0.61–1.24)
Calcium: 7.8 mg/dL — ABNORMAL LOW (ref 8.9–10.3)
Chloride: 101 mmol/L (ref 101–111)
Creatinine, Ser: 10.6 mg/dL — ABNORMAL HIGH (ref 0.61–1.24)
GFR calc Af Amer: 5 mL/min — ABNORMAL LOW (ref >60)
GFR calc non Af Amer: 4 mL/min — ABNORMAL LOW (ref >60)
GFR, EST AFRICAN AMERICAN: 5 mL/min — AB (ref >60)
GFR, EST NON AFRICAN AMERICAN: 4 mL/min — AB (ref >60)
Glucose, Bld: 31 mg/dL — CL (ref 65–99)
Glucose, Bld: 82 mg/dL (ref 65–99)
POTASSIUM: 3.5 mmol/L (ref 3.5–5.1)
POTASSIUM: 3.3 mmol/L — AB (ref 3.5–5.1)
Sodium: 137 mmol/L (ref 135–145)
Sodium: 136 mmol/L (ref 135–145)

## 2018-02-03 LAB — TSH: TSH: 2.258 u[IU]/mL (ref 0.350–4.500)

## 2018-02-03 MED ORDER — DEXTROSE 50 % IV SOLN
25.0000 mL | Freq: Once | INTRAVENOUS | Status: AC
  Administered 2018-02-03: 25 mL via INTRAVENOUS
  Administered 2018-02-03: 50 mL via INTRAVENOUS
  Filled 2018-02-03: qty 50

## 2018-02-03 MED ORDER — DEXTROSE-NACL 5-0.45 % IV SOLN: INTRAVENOUS | Status: DC

## 2018-02-03 MED ORDER — SEVELAMER CARBONATE 800 MG PO TABS
1600.0000 mg | ORAL_TABLET | Freq: Three times a day (TID) | ORAL | Status: DC
  Administered 2018-02-04 – 2018-02-07 (×11): 1600 mg via ORAL
  Filled 2018-02-03 (×12): qty 2

## 2018-02-03 MED ORDER — DICLOFENAC SODIUM 1 % TD GEL
2.0000 g | Freq: Three times a day (TID) | TRANSDERMAL | Status: DC
  Administered 2018-02-04 – 2018-02-07 (×7): 2 g via TOPICAL
  Filled 2018-02-03: qty 100

## 2018-02-03 MED ORDER — ALLOPURINOL 300 MG PO TABS
300.0000 mg | ORAL_TABLET | Freq: Every day | ORAL | Status: DC
  Administered 2018-02-04 – 2018-02-07 (×4): 300 mg via ORAL
  Filled 2018-02-03 (×5): qty 1

## 2018-02-03 MED ORDER — PRO-STAT SUGAR FREE PO LIQD
30.0000 mL | Freq: Two times a day (BID) | ORAL | Status: DC
  Administered 2018-02-04 – 2018-02-07 (×8): 30 mL via ORAL
  Filled 2018-02-03 (×8): qty 30

## 2018-02-03 MED ORDER — DEXTROSE 50 % IV SOLN
INTRAVENOUS | Status: AC
  Administered 2018-02-03: 50 mL via INTRAVENOUS
  Filled 2018-02-03: qty 50

## 2018-02-03 MED ORDER — DEXTROSE 50 % IV SOLN
INTRAVENOUS | Status: AC
  Administered 2018-02-03: 50 mL
  Filled 2018-02-03: qty 50

## 2018-02-03 MED ORDER — AMIODARONE HCL 200 MG PO TABS
200.0000 mg | ORAL_TABLET | Freq: Every day | ORAL | Status: DC
  Administered 2018-02-04 – 2018-02-07 (×4): 200 mg via ORAL
  Filled 2018-02-03 (×5): qty 1

## 2018-02-03 MED ORDER — STERILE WATER FOR INJECTION IV SOLN
INTRAVENOUS | Status: DC
  Administered 2018-02-03 – 2018-02-04 (×3): via INTRAVENOUS
  Filled 2018-02-03 (×3): qty 500

## 2018-02-03 MED ORDER — PANTOPRAZOLE SODIUM 40 MG PO TBEC
40.0000 mg | DELAYED_RELEASE_TABLET | Freq: Every day | ORAL | Status: DC
  Administered 2018-02-04 – 2018-02-06 (×3): 40 mg via ORAL
  Filled 2018-02-03 (×3): qty 1

## 2018-02-03 MED ORDER — GLUCOSE 40 % PO GEL
ORAL | Status: AC
  Administered 2018-02-03: 37.5 g
  Filled 2018-02-03: qty 1

## 2018-02-03 MED ORDER — DIPHENHYDRAMINE HCL 25 MG PO CAPS
25.0000 mg | ORAL_CAPSULE | Freq: Four times a day (QID) | ORAL | Status: DC | PRN
Start: 2018-02-03 — End: 2018-02-07
  Administered 2018-02-05 – 2018-02-06 (×2): 25 mg via ORAL
  Filled 2018-02-03 (×2): qty 1

## 2018-02-03 MED ORDER — DEXTROSE 50 % IV SOLN
INTRAVENOUS | Status: AC
  Filled 2018-02-03: qty 50

## 2018-02-03 MED ORDER — DEXTROSE 50 % IV SOLN
1.0000 | Freq: Once | INTRAVENOUS | Status: AC
  Administered 2018-02-03: 50 mL via INTRAVENOUS

## 2018-02-03 MED ORDER — DEXTROSE 50 % IV SOLN
25.0000 mL | Freq: Once | INTRAVENOUS | Status: AC
  Administered 2018-02-03: 25 mL via INTRAVENOUS
  Filled 2018-02-03: qty 50

## 2018-02-03 MED ORDER — ONDANSETRON HCL 4 MG/2ML IJ SOLN
4.0000 mg | Freq: Four times a day (QID) | INTRAMUSCULAR | Status: DC | PRN
  Administered 2018-02-04: 4 mg via INTRAVENOUS
  Filled 2018-02-03: qty 2

## 2018-02-03 MED ORDER — ONDANSETRON HCL 4 MG PO TABS: 4.0000 mg | ORAL_TABLET | Freq: Four times a day (QID) | ORAL | Status: DC | PRN

## 2018-02-03 MED ORDER — OXYCODONE-ACETAMINOPHEN 7.5-325 MG PO TABS
1.0000 | ORAL_TABLET | Freq: Four times a day (QID) | ORAL | Status: DC | PRN
  Administered 2018-02-04 – 2018-02-07 (×7): 1 via ORAL
  Filled 2018-02-03 (×7): qty 1

## 2018-02-03 MED ORDER — DEXTROSE 10 % IV SOLN
INTRAVENOUS | Status: DC
  Administered 2018-02-03: 15:00:00 via INTRAVENOUS

## 2018-02-03 MED ORDER — STERILE WATER FOR INJECTION IV SOLN
INTRAVENOUS | Status: DC
  Filled 2018-02-03: qty 1000

## 2018-02-03 MED ORDER — ACETAMINOPHEN 500 MG PO TABS
500.0000 mg | ORAL_TABLET | Freq: Four times a day (QID) | ORAL | Status: DC | PRN
  Administered 2018-02-04: 500 mg via ORAL
  Filled 2018-02-03: qty 1

## 2018-02-03 MED ORDER — DARBEPOETIN ALFA 200 MCG/0.4ML IJ SOSY
200.0000 ug | PREFILLED_SYRINGE | INTRAMUSCULAR | Status: DC
  Administered 2018-02-04: 200 ug via INTRAVENOUS

## 2018-02-03 MED ORDER — OXYBUTYNIN CHLORIDE 5 MG PO TABS
5.0000 mg | ORAL_TABLET | Freq: Two times a day (BID) | ORAL | Status: DC
  Administered 2018-02-04 – 2018-02-07 (×7): 5 mg via ORAL
  Filled 2018-02-03 (×7): qty 1

## 2018-02-03 MED ORDER — HEPARIN SODIUM (PORCINE) 5000 UNIT/ML IJ SOLN
5000.0000 [IU] | Freq: Three times a day (TID) | INTRAMUSCULAR | Status: DC
  Administered 2018-02-04 – 2018-02-06 (×4): 5000 [IU] via SUBCUTANEOUS
  Filled 2018-02-03 (×9): qty 1

## 2018-02-03 MED ORDER — RENA-VITE PO TABS
1.0000 | ORAL_TABLET | Freq: Every day | ORAL | Status: DC
  Administered 2018-02-04 – 2018-02-06 (×4): 1 via ORAL
  Filled 2018-02-03 (×4): qty 1

## 2018-02-03 NOTE — ED Triage Notes (Signed)
PT from home with reported Hypoglycemia. EMS reported CBG of 20 at home. Pt given 3 pkgs.  Of  PO glucose with increased of CBG 70. Pt A/O on arrivall to ED.

## 2018-02-03 NOTE — ED Notes (Signed)
Admitting MD - Dr. Lorin Mercy paged to [place] RN @ [phone]

## 2018-02-03 NOTE — ED Notes (Signed)
Pls call dtr, Erich Kochan [phone] with any updates and/or room change.

## 2018-02-03 NOTE — ED Triage Notes (Signed)
CBG 71.  

## 2018-02-03 NOTE — Consult Note (Addendum)
[facility] Renal Consultation Note    Indication for Consultation:  Management of ESRD/hemodialysis; anemia, hypertension/volume and secondary hyperparathyroidism  HPI: Albert Stanley is a 72 y.o. male with ESRD on HD ([place]), DM, CHF, NICM EF 40%, hx DVT s/p IVC filter, hx right renal mass. Recent [facility] admit 12/2017 with traumatic SDH s/p craniotomy.   Presented to ED from home with persistent hypoglycemia. Family called EMS when he became poorly responsive at home. Reports blood sugars around 20. Did not improve with PO intake. Remains hypoglycemic in ED CBG 30-40, treating with dextrose infusion. Additionally reports diarrhea x 2 weeks. Family reports diet has been limited this week  to clear liquids as ordered by his PCP to treat diarrhea. He was instructed to hold his glipizide with blood sugar readings <70.  CXR clear. Labs Na 137, K 3.5, Glucose 31, WBC 5.7, Hgb 8.5   Seen in the ED he is alert, oriented. He has no complaints, denies fever, chills, CP, SOB, nausea, vomiting, abdominal pain. He has some bruises on his face from a fall at his nursing home about 2 weeks ago. He will be admitted for further evaluation  He is due for routine dialysis today. He missed dialysis on Tuesday because of diarrhea.   Past Medical History:  Diagnosis Date  . Arthritis    Osteoarthritis  . Asthma   . Bladder cancer ([place]) dx'd 1990   surg only  . CHF (congestive heart failure) ([place])   . Diabetes mellitus without complication ([place])   . ESRD (end stage renal disease) ([name])    Tues, Thurs, Sat dialysis  . GERD (gastroesophageal reflux disease)   . History of cardiac catheterization    a. LHC 5/17: no obstructive CAD  . History of DVT (deep vein thrombosis)    2003 ish  . History of nuclear stress test    a. Myoview 4/17: EF 39%, inf, inf-lat, apical inf, apical lat, apical scar, intermediate risk  . Hyperlipidemia   . Hypertension   . NICM (nonischemic  cardiomyopathy) ([place])    a. Echo 2/17: mod LVH, EF 35-40%, inf-lat and inf-sept HK, mod MR, severe LAE, small pericardial effusion   Past Surgical History:  Procedure Laterality Date  . arthroscopic knee surgery Left   . AV FISTULA PLACEMENT Left 12/13/2015   Procedure: ARTERIOVENOUS (AV) FISTULA CREATION;  Surgeon: Angelia Mould, MD;  Location: [facility] OR;  Service: Vascular;  Laterality: Left;  . AV FISTULA PLACEMENT Left 12/15/2017   Procedure: INSERTION OF ARTERIOVENOUS (AV) GORE-TEX GRAFT ARM;  Surgeon: Serafina Mitchell, MD;  Location: [place];  Service: Vascular;  Laterality: Left;  . BACK SURGERY     2 times  1960  . CARDIAC CATHETERIZATION N/A 02/28/2016   Procedure: Left Heart Cath and Coronary Angiography;  Surgeon: Larey Dresser, MD;  Location: [place] [facility];  Service: Cardiovascular;  Laterality: N/A;  . CRANIOTOMY N/A 12/05/2017   Procedure: CRANIOTOMY HEMATOMA EVACUATION SUBDURAL;  Surgeon: Ashok Pall, MD;  Location: [place];  Service: Neurosurgery;  Laterality: N/A;  . CYSTOSCOPY    . FISTULA SUPERFICIALIZATION Left 08/14/2016   Procedure: FISTULA SUPERFICIALIZATION LEFT UPPER ARM;  Surgeon: Angelia Mould, MD;  Location: [place];  Service: Vascular;  Laterality: Left;  . INSERTION OF DIALYSIS CATHETER N/A 12/13/2015   Procedure: INSERTION OF DIALYSIS CATHETER;  Surgeon: Angelia Mould, MD;  Location: [place];  Service: Vascular;  Laterality: N/A;  . INSERTION OF DIALYSIS CATHETER Left  05/07/2017   Procedure: INSERTION OF DIALYSIS CATHETER LEFT INTERNAL JUGULAR PLACEMENT;  Surgeon: Angelia Mould, MD;  Location: [place];  Service: Vascular;  Laterality: Left;  . INSERTION OF DIALYSIS CATHETER Left 12/15/2017   Procedure: INSERTION OF DIALYSIS CATHETER;  Surgeon: Serafina Mitchell, MD;  Location: [facility] OR;  Service: Vascular;  Laterality: Left;  . IR IVC FILTER PLMT / S&I Burke Keels GUID/MOD SED  12/18/2017  . IR RADIOLOGIST EVAL & MGMT  05/12/2017  . JOINT REPLACEMENT Left     hip  . LIGATION OF COMPETING BRANCHES OF ARTERIOVENOUS FISTULA Left 08/14/2016   Procedure: LIGATION OF COMPETING BRANCHES OF ARTERIOVENOUS FISTULA LEFT UPPER ARM;  Surgeon: Angelia Mould, MD;  Location: [place];  Service: Vascular;  Laterality: Left;  . PERIPHERAL VASCULAR CATHETERIZATION Left 06/09/2016   Procedure: Fistulagram;  Surgeon: Serafina Mitchell, MD;  Location: [place] [facility];  Service: Cardiovascular;  Laterality: Left;  ARM  . PERIPHERAL VASCULAR CATHETERIZATION Left 06/09/2016   Procedure: Peripheral Vascular Balloon Angioplasty;  Surgeon: Serafina Mitchell, MD;  Location: [place] [facility];  Service: Cardiovascular;  Laterality: Left;  upper arm venous  . PERIPHERAL VASCULAR CATHETERIZATION Left 06/25/2016   Procedure: Fistulagram;  Surgeon: Serafina Mitchell, MD;  Location: [place] [facility];  Service: Cardiovascular;  Laterality: Left;  . REVISON OF ARTERIOVENOUS FISTULA Left 05/07/2017   Procedure: REPAIR OF PSEUDOANEURYSM  LEFT BRACHIO-CEPHALIC  ARM ARTERIOVENOUS FISTULA;  Surgeon: Angelia Mould, MD;  Location: [facility] OR;  Service: Vascular;  Laterality: Left;   Family History  Problem Relation Age of Onset  . Arthritis Mother   . Cancer Father   . Heart disease Father   . Hypertension Sister   . Alcohol abuse Brother   . Diabetes Daughter   . Heart attack Paternal Uncle    Social History:  reports that he quit smoking about 19 years ago. He quit after 30.00 years of use. He has never used smokeless tobacco. He reports that he does not drink alcohol or use drugs. Allergies  Allergen Reactions  . Meperidine Shortness Of Breath and Other (See Comments)    Increased heart rate Reaction to demerol  . Penicillins Other (See Comments)    "Knocks me out."   PATIENT HAS HAD A PCN REACTION WITH IMMEDIATE RASH, FACIAL/TONGUE/THROAT SWELLING, SOB, OR LIGHTHEADEDNESS WITH HYPOTENSION:  # # #  YES  >> SYNCOPE ### Has patient had a PCN reaction causing severe rash  involving mucus membranes or skin necrosis: unknown PATIENT HAS HAD A PCN REACTION THAT REQUIRED TREATMENT AT MD'S OFFICE Has patient had a PCN reaction occurring within the last 10 years: no   . Sulfa Antibiotics Shortness Of Breath and Other (See Comments)    Increased heart rate  . Sulfonamide Derivatives Shortness Of Breath and Other (See Comments)    TACHYCARDIA  . [place] Diarrhea, Nausea And Vomiting and Other (See Comments)    Constipation Reaction to whole milk  . Lactose Intolerance (Gi) Diarrhea, Nausea And Vomiting and Other (See Comments)    Constipation Reaction to whole milk  . Milk-Related Compounds Diarrhea, Nausea And Vomiting and Other (See Comments)    Constipation Reaction to whole milk  . Foltx [Elfolate Plus]     UNSPECIFIED REACTION  "Pt does not recall reaction to this"  . Betadine [Povidone Iodine] Itching, Rash and Other (See Comments)    burning  . Eggs Or Egg-Derived Products Nausea And Vomiting    Reaction to egg yolks -  not whites     Prior to Admission medications   Medication Sig Start Date End Date Taking? Authorizing Provider  acetaminophen (TYLENOL) 500 MG tablet Take 500 mg by mouth every 6 (six) hours as needed for mild pain.   Yes [provider]  allopurinol (ZYLOPRIM) 300 MG tablet Take 300 mg by mouth daily.    Yes [provider]  Amino Acids-Protein Hydrolys (FEEDING SUPPLEMENT, PRO-STAT SUGAR FREE 64,) LIQD Take 30 mLs by mouth 2 (two) times daily.   Yes [provider]  amiodarone (PACERONE) 200 MG tablet Take 1 tablet (200 mg total) by mouth daily. 09/24/17  Yes Larey Dresser, MD  bacitracin ointment Apply 1 application topically 2 (two) times daily. 01/18/18  Yes Antonietta Breach, PA-C  bisacodyl (DULCOLAX) 5 MG EC tablet Take 5 mg by mouth at bedtime.   Yes [provider]  diclofenac sodium (VOLTAREN) 1 % GEL Apply 2 g topically 3 (three) times daily. Apply to lower back, left hip and knee   Yes  [provider]  diphenhydrAMINE (BENADRYL) 25 MG tablet Take 25 mg by mouth every 6 (six) hours as needed for allergies.   Yes [provider]  glipiZIDE (GLUCOTROL XL) 2.5 MG 24 hr tablet Take 2.5 mg by mouth daily with breakfast.   Yes [provider]  lactulose (CHRONULAC) 10 GM/15ML solution Take 10 g by mouth daily as needed for mild constipation.   Yes [provider]  liver oil-zinc oxide (DESITIN) 40 % ointment Apply 1 application topically daily as needed for irritation.   Yes [provider]  Melatonin 3 MG TABS Take 6 mg by mouth at bedtime.   Yes [provider]  multivitamin (RENA-VIT) TABS tablet Take 1 tablet by mouth at bedtime.  12/20/15  Yes [provider]  neomycin-bacitracin-polymyxin (NEOSPORIN) [phone] ointment Apply 1 application topically daily as needed (area on scalp).   Yes [provider]  oxybutynin (DITROPAN) 5 MG tablet Take 5 mg by mouth 2 (two) times daily.   Yes [provider]  oxyCODONE-acetaminophen (PERCOCET) 7.5-325 MG tablet Take 1 tablet by mouth every 6 (six) hours as needed for moderate pain. 01/07/18  Yes Medina-Vargas, Monina C, NP  pantoprazole (PROTONIX) 40 MG tablet Take 40 mg by mouth at bedtime.   Yes [provider]  polyethylene glycol (MIRALAX / GLYCOLAX) packet Take 17 g by mouth daily. Hold for loose stool   Yes [provider]  sevelamer carbonate (RENVELA) 800 MG tablet Take 2 tablets (1,600 mg total) by mouth 3 (three) times daily with meals. 12/18/15  Yes Geradine Girt, DO   Current Facility-Administered Medications  Medication Dose Route Frequency Provider Last Rate Last Dose  . acetaminophen (TYLENOL) tablet 500 mg  500 mg Oral Q6H PRN Radene Gunning, NP      . allopurinol (ZYLOPRIM) tablet 300 mg  300 mg Oral Daily Black, Karen M, NP      . amiodarone (PACERONE) tablet 200 mg  200 mg Oral Daily Black, Karen M, NP      . dextrose 10 %  infusion   Intravenous Continuous Black, Karen M, NP      . dextrose 50 % solution 25 mL  25 mL Intravenous Once Black, Karen M, NP      . dextrose 50 % solution           . diclofenac sodium (VOLTAREN) 1 % transdermal gel 2 g  2 g Topical TID Dyanne Carrel  M, NP      . diphenhydrAMINE (BENADRYL) tablet 25 mg  25 mg Oral Q6H PRN Black, Karen M, NP      . feeding supplement (PRO-STAT SUGAR FREE 64) liquid 30 mL  30 mL Oral BID Black, Karen M, NP      . heparin injection 5,000 Units  5,000 Units Subcutaneous Q8H Black, Lezlie Octave, NP      . multivitamin (RENA-VIT) tablet 1 tablet  1 tablet Oral QHS Black, Lezlie Octave, NP      . ondansetron [facility]) tablet 4 mg  4 mg Oral Q6H PRN Radene Gunning, NP       Or  . ondansetron [facility]) injection 4 mg  4 mg Intravenous Q6H PRN Radene Gunning, NP      . oxybutynin (DITROPAN) tablet 5 mg  5 mg Oral BID Black, Karen M, NP      . oxyCODONE-acetaminophen (PERCOCET) 7.5-325 MG per tablet 1 tablet  1 tablet Oral Q6H PRN Radene Gunning, NP      . pantoprazole (PROTONIX) EC tablet 40 mg  40 mg Oral QHS Black, Karen M, NP      . sevelamer carbonate (RENVELA) tablet 1,600 mg  1,600 mg Oral TID WC Radene Gunning, NP       Current Outpatient Medications  Medication Sig Dispense Refill  . acetaminophen (TYLENOL) 500 MG tablet Take 500 mg by mouth every 6 (six) hours as needed for mild pain.    Marland Kitchen allopurinol (ZYLOPRIM) 300 MG tablet Take 300 mg by mouth daily.     . Amino Acids-Protein Hydrolys (FEEDING SUPPLEMENT, PRO-STAT SUGAR FREE 64,) LIQD Take 30 mLs by mouth 2 (two) times daily.    Marland Kitchen amiodarone (PACERONE) 200 MG tablet Take 1 tablet (200 mg total) by mouth daily. 30 tablet 3  . bacitracin ointment Apply 1 application topically 2 (two) times daily. 15 g 0  . bisacodyl (DULCOLAX) 5 MG EC tablet Take 5 mg by mouth at bedtime.    . diclofenac sodium (VOLTAREN) 1 % GEL Apply 2 g topically 3 (three) times daily. Apply to lower back, left hip and knee    . diphenhydrAMINE  (BENADRYL) 25 MG tablet Take 25 mg by mouth every 6 (six) hours as needed for allergies.    Marland Kitchen glipiZIDE (GLUCOTROL XL) 2.5 MG 24 hr tablet Take 2.5 mg by mouth daily with breakfast.    . lactulose (CHRONULAC) 10 GM/15ML solution Take 10 g by mouth daily as needed for mild constipation.    Marland Kitchen liver oil-zinc oxide (DESITIN) 40 % ointment Apply 1 application topically daily as needed for irritation.    . Melatonin 3 MG TABS Take 6 mg by mouth at bedtime.    . multivitamin (RENA-VIT) TABS tablet Take 1 tablet by mouth at bedtime.   0  . neomycin-bacitracin-polymyxin (NEOSPORIN) [phone] ointment Apply 1 application topically daily as needed (area on scalp).    Marland Kitchen oxybutynin (DITROPAN) 5 MG tablet Take 5 mg by mouth 2 (two) times daily.    Marland Kitchen oxyCODONE-acetaminophen (PERCOCET) 7.5-325 MG tablet Take 1 tablet by mouth every 6 (six) hours as needed for moderate pain. 60 tablet 0  . pantoprazole (PROTONIX) 40 MG tablet Take 40 mg by mouth at bedtime.    . polyethylene glycol (MIRALAX / GLYCOLAX) packet Take 17 g by mouth daily. Hold for loose stool    . sevelamer carbonate (RENVELA) 800 MG tablet Take 2 tablets (1,600 mg total) by mouth 3 (three) times  daily with meals. 90 tablet 0    ROS: As per HPI otherwise negative.  Physical Exam: Vitals:   02/03/18 1026  Weight: 95.7 kg (211 lb)  Height: 5\' 9"  (1.753 m)     General: Obese male NAD  Head: NCAT sclera not icteric; facial edema, bruises over R eye  Neck: Supple. No JVD No masses Lungs: CTA bilaterally without wheezes, rales, or rhonchi. Breathing is unlabored. Heart: RRR with S1 S2 Abdomen: soft NT + BS; Foley in place  Lower extremities: pitting bilat LE edema to knees  Neuro: A & O  X 3. Moves all extremities spontaneously. Psych:  Responds to questions appropriately with a normal affect. Dialysis Access: L IJ TDC; LUE AVG +bruit   Labs: Basic Metabolic Panel: Recent Labs  Lab 02/03/18 1022  NA 137  K 3.5  CL 100*  CO2 16*   GLUCOSE 31*  BUN 72*  CREATININE 10.69*  CALCIUM 7.8*   Liver Function Tests: No results for input(s): AST, ALT, ALKPHOS, BILITOT, PROT, ALBUMIN in the last 168 hours. No results for input(s): LIPASE, AMYLASE in the last 168 hours. No results for input(s): AMMONIA in the last 168 hours. CBC: Recent Labs  Lab 02/03/18 1022  WBC 5.7  NEUTROABS 4.0  HGB 8.5*  HCT 25.1*  MCV 89.0  PLT 135*   Cardiac Enzymes: No results for input(s): CKTOTAL, CKMB, CKMBINDEX, TROPONINI in the last 168 hours. CBG: Recent Labs  Lab 02/03/18 1017 02/03/18 1031 02/03/18 1054 02/03/18 1218 02/03/18 1259  GLUCAP 16* 20* 71 36* 44*   Iron Studies: No results for input(s): IRON, TIBC, TRANSFERRIN, FERRITIN in the last 72 hours. Studies/Results: Dg Chest Port 1 View  Result Date: 02/03/2018 CLINICAL DATA:  Weakness EXAM: PORTABLE CHEST 1 VIEW COMPARISON:  12/24/2017 FINDINGS: Cardiac shadow is enlarged. Dialysis catheter is again seen. The lungs are well aerated bilaterally without focal infiltrate or sizable effusion. No acute bony abnormality is noted. IMPRESSION: No active disease. Electronically Signed   By: Inez Catalina M.D.   On: 02/03/2018 10:50    Dialysis Orders:  [facility] TTS 4h 400/800 88.5kg 2K/2Ca Profile 4 LUE AVG placed 12/15/17 (should be ok to use) L IJ TDC  No Heparin   Mircera 276mcg IV q 2 weeks (last 4/11)  Calcitriol 3.90mcg PO TIW    Assessment/Plan: 1.  Hypoglycemia - persistent CGBs 30s-40s, D50 dosed in ED, now on infusion - per primary team  2.  ESRD -  TTS. Orders written for HD today on schedule, Use added K bath  3.  Hypertension/volume  - BP controlled/Chronic LE edema, continue UF as tolerated for volume. Midodrine pre HD  4.  Anemia  - Hgb 8.5. Redose ESA with HD today. Aranesp 200 ordered  5.  Metabolic bone disease -  Continue Calcitriol/Renvela/Sensipar  6.  Nutrition - Renal diet vitamins 7. DM  8. Hx SDH s/p craniotomy  9. Hx DVT s/p IVC filter  10. NICM/CHF    Lynnda Child PA-C [facility] Pager [phone] 02/03/2018, 2:31 PM

## 2018-02-03 NOTE — H&P (Signed)
History and Physical    Albert Stanley FIE:[id number] DOB: 1945-10-18 DOA: 02/03/2018  PCP: [name], MD Patient coming from: home  Chief Complaint: hypoglycemia  HPI: Albert Stanley is a 72 y.o. male with medical history significant end-stage renal disease on Tuesday Thursday Saturday dialysis, hypertension, chronic diastolic heart failure, diabetes, traumatic subdural hematoma presents to the emergency Department chief complaint of hypoglycemia. [facility] are asked to admit.  Formation is obtained from the patient and his wife He states he's had diarrhea for the last several days to the point that he missed his dialysis treatment 2 days ago. He reports he awakened this morning act his blood sugar and it was low. He is on oral agents for diabetes but reports he has not taken glipizide for 3 days. He does report a recent hospitalization for subdural hematoma after a fall. He denies headache dizziness syncope or near-syncope. He denies chest pain palpitations shortness of breath. He denies dysuria hematuria frequency or urgency.  ED Course: in the emergency department he's afebrile hemodynamically stable and not hypoxic. He is provided with 2 A of D50 and a continuous fluids of D10 initiated.  Review of Systems: As per HPI otherwise all other systems reviewed and are negative.   Ambulatory Status: he ambulates with a walker mostly wheelchair-bound  Past Medical History:  Diagnosis Date  . Arthritis    Osteoarthritis  . Asthma   . Bladder cancer ([place]) dx'd 1990   surg only  . CHF (congestive heart failure) ([place])   . Diabetes mellitus without complication ([place])   . ESRD (end stage renal disease) ([name])    Tues, Thurs, Sat dialysis  . GERD (gastroesophageal reflux disease)   . History of cardiac catheterization    a. LHC 5/17: no obstructive CAD  . History of DVT (deep vein thrombosis)    2003 ish  . History of nuclear stress test    a. Myoview 4/17: EF 39%, inf,  inf-lat, apical inf, apical lat, apical scar, intermediate risk  . Hyperlipidemia   . Hypertension   . NICM (nonischemic cardiomyopathy) ([place])    a. Echo 2/17: mod LVH, EF 35-40%, inf-lat and inf-sept HK, mod MR, severe LAE, small pericardial effusion    Past Surgical History:  Procedure Laterality Date  . arthroscopic knee surgery Left   . AV FISTULA PLACEMENT Left 12/13/2015   Procedure: ARTERIOVENOUS (AV) FISTULA CREATION;  Surgeon: Angelia Mould, MD;  Location: [facility] OR;  Service: Vascular;  Laterality: Left;  . AV FISTULA PLACEMENT Left 12/15/2017   Procedure: INSERTION OF ARTERIOVENOUS (AV) GORE-TEX GRAFT ARM;  Surgeon: Serafina Mitchell, MD;  Location: [place];  Service: Vascular;  Laterality: Left;  . BACK SURGERY     2 times  1960  . CARDIAC CATHETERIZATION N/A 02/28/2016   Procedure: Left Heart Cath and Coronary Angiography;  Surgeon: Larey Dresser, MD;  Location: [place] [facility];  Service: Cardiovascular;  Laterality: N/A;  . CRANIOTOMY N/A 12/05/2017   Procedure: CRANIOTOMY HEMATOMA EVACUATION SUBDURAL;  Surgeon: Ashok Pall, MD;  Location: [place];  Service: Neurosurgery;  Laterality: N/A;  . CYSTOSCOPY    . FISTULA SUPERFICIALIZATION Left 08/14/2016   Procedure: FISTULA SUPERFICIALIZATION LEFT UPPER ARM;  Surgeon: Angelia Mould, MD;  Location: [place];  Service: Vascular;  Laterality: Left;  . INSERTION OF DIALYSIS CATHETER N/A 12/13/2015   Procedure: INSERTION OF DIALYSIS CATHETER;  Surgeon: Angelia Mould, MD;  Location: [place];  Service: Vascular;  Laterality: N/A;  .  INSERTION OF DIALYSIS CATHETER Left 05/07/2017   Procedure: INSERTION OF DIALYSIS CATHETER LEFT INTERNAL JUGULAR PLACEMENT;  Surgeon: Angelia Mould, MD;  Location: [place];  Service: Vascular;  Laterality: Left;  . INSERTION OF DIALYSIS CATHETER Left 12/15/2017   Procedure: INSERTION OF DIALYSIS CATHETER;  Surgeon: Serafina Mitchell, MD;  Location: [facility];  Service: Vascular;  Laterality: Left;    . IR IVC FILTER PLMT / S&I Burke Keels GUID/MOD SED  12/18/2017  . IR RADIOLOGIST EVAL & MGMT  05/12/2017  . JOINT REPLACEMENT Left    hip  . LIGATION OF COMPETING BRANCHES OF ARTERIOVENOUS FISTULA Left 08/14/2016   Procedure: LIGATION OF COMPETING BRANCHES OF ARTERIOVENOUS FISTULA LEFT UPPER ARM;  Surgeon: Angelia Mould, MD;  Location: [place];  Service: Vascular;  Laterality: Left;  . PERIPHERAL VASCULAR CATHETERIZATION Left 06/09/2016   Procedure: Fistulagram;  Surgeon: Serafina Mitchell, MD;  Location: [place] [facility];  Service: Cardiovascular;  Laterality: Left;  ARM  . PERIPHERAL VASCULAR CATHETERIZATION Left 06/09/2016   Procedure: Peripheral Vascular Balloon Angioplasty;  Surgeon: Serafina Mitchell, MD;  Location: [place] [facility];  Service: Cardiovascular;  Laterality: Left;  upper arm venous  . PERIPHERAL VASCULAR CATHETERIZATION Left 06/25/2016   Procedure: Fistulagram;  Surgeon: Serafina Mitchell, MD;  Location: [place] [facility];  Service: Cardiovascular;  Laterality: Left;  . REVISON OF ARTERIOVENOUS FISTULA Left 05/07/2017   Procedure: REPAIR OF PSEUDOANEURYSM  LEFT BRACHIO-CEPHALIC  ARM ARTERIOVENOUS FISTULA;  Surgeon: Angelia Mould, MD;  Location: [place];  Service: Vascular;  Laterality: Left;    Social History   Socioeconomic History  . Marital status: Married    Spouse name: Not on file  . Number of children: Not on file  . Years of education: Not on file  . Highest education level: Not on file  Occupational History  . Occupation: Retired  Scientific laboratory technician  . Financial resource strain: Not on file  . Food insecurity:    Worry: Not on file    Inability: Not on file  . Transportation needs:    Medical: Not on file    Non-medical: Not on file  Tobacco Use  . Smoking status: Former Smoker    Years: 30.00    Last attempt to quit: 10/12/1998    Years since quitting: 19.3  . Smokeless tobacco: Never Used  Substance and Sexual Activity  . Alcohol use: No     Alcohol/week: 0.0 oz    Comment: Used to drink on the weekends, quit 2 years ago  . Drug use: No  . Sexual activity: Not Currently  Lifestyle  . Physical activity:    Days per week: Not on file    Minutes per session: Not on file  . Stress: Not on file  Relationships  . Social connections:    Talks on phone: Not on file    Gets together: Not on file    Attends religious service: Not on file    Active member of club or organization: Not on file    Attends meetings of clubs or organizations: Not on file    Relationship status: Not on file  . Intimate partner violence:    Fear of current or ex partner: Not on file    Emotionally abused: Not on file    Physically abused: Not on file    Forced sexual activity: Not on file  Other Topics Concern  . Not on file  Social History Narrative   Lives with  wife and daughter.    Allergies  Allergen Reactions  . Meperidine Shortness Of Breath and Other (See Comments)    Increased heart rate Reaction to demerol  . Penicillins Other (See Comments)    "Knocks me out."   PATIENT HAS HAD A PCN REACTION WITH IMMEDIATE RASH, FACIAL/TONGUE/THROAT SWELLING, SOB, OR LIGHTHEADEDNESS WITH HYPOTENSION:  # # #  YES  >> SYNCOPE ### Has patient had a PCN reaction causing severe rash involving mucus membranes or skin necrosis: unknown PATIENT HAS HAD A PCN REACTION THAT REQUIRED TREATMENT AT MD'S OFFICE Has patient had a PCN reaction occurring within the last 10 years: no   . Sulfa Antibiotics Shortness Of Breath and Other (See Comments)    Increased heart rate  . Sulfonamide Derivatives Shortness Of Breath and Other (See Comments)    TACHYCARDIA  . [place] Diarrhea, Nausea And Vomiting and Other (See Comments)    Constipation Reaction to whole milk  . Lactose Intolerance (Gi) Diarrhea, Nausea And Vomiting and Other (See Comments)    Constipation Reaction to whole milk  . Milk-Related Compounds Diarrhea, Nausea And Vomiting and Other (See Comments)     Constipation Reaction to whole milk  . Foltx [Elfolate Plus]     UNSPECIFIED REACTION  "Pt does not recall reaction to this"  . Betadine [Povidone Iodine] Itching, Rash and Other (See Comments)    burning  . Eggs Or Egg-Derived Products Nausea And Vomiting    Reaction to egg yolks - not whites      Family History  Problem Relation Age of Onset  . Arthritis Mother   . Cancer Father   . Heart disease Father   . Hypertension Sister   . Alcohol abuse Brother   . Diabetes Daughter   . Heart attack Paternal Uncle     Prior to Admission medications   Medication Sig Start Date End Date Taking? Authorizing Provider  acetaminophen (TYLENOL) 500 MG tablet Take 500 mg by mouth every 6 (six) hours as needed for mild pain.   Yes [provider]  allopurinol (ZYLOPRIM) 300 MG tablet Take 300 mg by mouth daily.    Yes [provider]  Amino Acids-Protein Hydrolys (FEEDING SUPPLEMENT, PRO-STAT SUGAR FREE 64,) LIQD Take 30 mLs by mouth 2 (two) times daily.   Yes [provider]  amiodarone (PACERONE) 200 MG tablet Take 1 tablet (200 mg total) by mouth daily. 09/24/17  Yes Larey Dresser, MD  bacitracin ointment Apply 1 application topically 2 (two) times daily. 01/18/18  Yes Antonietta Breach, PA-C  bisacodyl (DULCOLAX) 5 MG EC tablet Take 5 mg by mouth at bedtime.   Yes [provider]  diclofenac sodium (VOLTAREN) 1 % GEL Apply 2 g topically 3 (three) times daily. Apply to lower back, left hip and knee   Yes [provider]  diphenhydrAMINE (BENADRYL) 25 MG tablet Take 25 mg by mouth every 6 (six) hours as needed for allergies.   Yes [provider]  glipiZIDE (GLUCOTROL XL) 2.5 MG 24 hr tablet Take 2.5 mg by mouth daily with breakfast.   Yes [provider]  lactulose (CHRONULAC) 10 GM/15ML solution Take 10 g by mouth daily as needed for mild constipation.   Yes [provider]  liver oil-zinc oxide (DESITIN) 40 % ointment  Apply 1 application topically daily as needed for irritation.   Yes [provider]  Melatonin 3 MG TABS Take 6 mg by mouth at bedtime.   Yes [provider]  multivitamin (RENA-VIT) TABS tablet Take 1 tablet by mouth at bedtime.  12/20/15  Yes [provider]  neomycin-bacitracin-polymyxin (NEOSPORIN) [phone] ointment Apply 1 application topically daily as needed (area on scalp).   Yes [provider]  oxybutynin (DITROPAN) 5 MG tablet Take 5 mg by mouth 2 (two) times daily.   Yes [provider]  oxyCODONE-acetaminophen (PERCOCET) 7.5-325 MG tablet Take 1 tablet by mouth every 6 (six) hours as needed for moderate pain. 01/07/18  Yes Medina-Vargas, Monina C, NP  pantoprazole (PROTONIX) 40 MG tablet Take 40 mg by mouth at bedtime.   Yes [provider]  polyethylene glycol (MIRALAX / GLYCOLAX) packet Take 17 g by mouth daily. Hold for loose stool   Yes [provider]  sevelamer carbonate (RENVELA) 800 MG tablet Take 2 tablets (1,600 mg total) by mouth 3 (three) times daily with meals. 12/18/15  Yes Geradine Girt, DO    Physical Exam: Vitals:   02/03/18 1026  Weight: 95.7 kg (211 lb)  Height: 5\' 9"  (1.753 m)     General:  Appears calm and comfortable in no acute distress Eyes:  PERRL, EOMI, normal lids, iris ENT:  grossly normal hearing, lips & tongue, mmm Neck:  no LAD, masses or thyromegaly Cardiovascular:  RRR, no m/r/g. 2+ lower extremity edema Respiratory:  CTA bilaterally, no w/r/r. Normal respiratory effort. Abdomen:  soft, ntnd, positive bowel sounds throughout no guarding or rebounding Skin:  no rash or induration seen on limited exam Musculoskeletal:  grossly normal tone BUE/BLE, good ROM, no bony abnormality Psychiatric:  grossly normal mood and affect, speech fluent and appropriate, AOx3 Neurologic:  CN 2-12 grossly intact, moves all extremities in coordinated fashion, sensation intact speech clear facial  symmetry  Labs on Admission: I have personally reviewed following labs and imaging studies  CBC: Recent Labs  Lab 02/03/18 1022  WBC 5.7  NEUTROABS 4.0  HGB 8.5*  HCT 25.1*  MCV 89.0  PLT 875*   Basic Metabolic Panel: Recent Labs  Lab 02/03/18 1022  NA 137  K 3.5  CL 100*  CO2 16*  GLUCOSE 31*  BUN 72*  CREATININE 10.69*  CALCIUM 7.8*   GFR: Estimated Creatinine Clearance: 7.1 mL/min (A) (by C-G formula based on SCr of 10.69 mg/dL (H)). Liver Function Tests: No results for input(s): AST, ALT, ALKPHOS, BILITOT, PROT, ALBUMIN in the last 168 hours. No results for input(s): LIPASE, AMYLASE in the last 168 hours. No results for input(s): AMMONIA in the last 168 hours. Coagulation Profile: No results for input(s): INR, PROTIME in the last 168 hours. Cardiac Enzymes: No results for input(s): CKTOTAL, CKMB, CKMBINDEX, TROPONINI in the last 168 hours. BNP (last 3 results) No results for input(s): PROBNP in the last 8760 hours. HbA1C: No results for input(s): HGBA1C in the last 72 hours. CBG: Recent Labs  Lab 02/03/18 1259 02/03/18 1443 02/03/18 1516 02/03/18 1548 02/03/18 1639  GLUCAP 44* 24* 40* 24* 74   Lipid Profile: No results for input(s): CHOL, HDL, LDLCALC, TRIG, CHOLHDL, LDLDIRECT in the last 72 hours. Thyroid Function Tests: No results for input(s): TSH, T4TOTAL, FREET4, T3FREE, THYROIDAB in the last 72 hours. Anemia Panel: No results for input(s): VITAMINB12, FOLATE, FERRITIN, TIBC, IRON, RETICCTPCT in the last 72 hours. Urine analysis:    Component Value Date/Time   COLORURINE YELLOW 06/26/2017 1652   APPEARANCEUR TURBID (A) 06/26/2017 1652   LABSPEC 1.015 06/26/2017 1652   PHURINE 6.0 06/26/2017 1652   GLUCOSEU NEGATIVE 06/26/2017 1652   HGBUR  LARGE (A) 06/26/2017 1652   BILIRUBINUR NEGATIVE 06/26/2017 1652   KETONESUR NEGATIVE 06/26/2017 1652   PROTEINUR >=300 (A) 06/26/2017 1652   UROBILINOGEN 0.2 05/28/2010 1109   NITRITE NEGATIVE  06/26/2017 1652   LEUKOCYTESUR MODERATE (A) 06/26/2017 1652    Creatinine Clearance: Estimated Creatinine Clearance: 7.1 mL/min (A) (by C-G formula based on SCr of 10.69 mg/dL (H)).  Sepsis Labs: @LABRCNTIP (procalcitonin:4,lacticidven:4) )No results found for this or any previous visit (from the past 240 hour(s)).   Radiological Exams on Admission: Dg Chest Port 1 View  Result Date: 02/03/2018 CLINICAL DATA:  Weakness EXAM: PORTABLE CHEST 1 VIEW COMPARISON:  12/24/2017 FINDINGS: Cardiac shadow is enlarged. Dialysis catheter is again seen. The lungs are well aerated bilaterally without focal infiltrate or sizable effusion. No acute bony abnormality is noted. IMPRESSION: No active disease. Electronically Signed   By: Inez Catalina M.D.   On: 02/03/2018 10:50    EKG: Sinus tachycardia Multiform ventricular premature complexes Incomplete left bundle branch block Low voltage, precordial leads Anterior Q waves, possibly due to ILBBB  Assessment/Plan Principal Problem:   Hypoglycemia Active Problems:   Anemia associated with chronic renal failure   Chronic diastolic congestive heart failure ([name])   Diabetes mellitus type 2 in nonobese [facility])   Essential hypertension   #1. Hypoglycemia. Etiology uncertain. No signs symptoms of infection. He did miss a day of dialysis recently. Home meds include glipizide the patient reports he has not taken this for 2 days. -Monitor CBG every 2 hours -Continue IV fluids of D10 -hold glipizide -Dialysis per nephrology -Regular diet  #2. Hypertension. Controlled in the emergency department. Home medications include amiodarone,  -continue home meds -Monitor  #3. Chronic diastolic heart failure. This x-ray with no active disease. He does not appear overloaded. Echo done June 2018 with an EF of 25-30%, diffuse hypokinesis mildly dilated RV and moderate MR with moderate pulmonary hypertension -continue home meds -Dialysis per nephrology -Intake and  output -Daily weights  #4. Diabetes. -See #1  #5. Anemia associated with chronic disease -Stable  #6. Diarrhea. Etiology unclear. No nausea or vomiting. No abdominal pain -GI pathogen panel   DVT prophylaxis: heparin  Code Status: full Family Communication: daughter at bedside  Disposition Plan: home  Consults called: [facility]  Admission status: inpatient    Radene Gunning MD [facility]  If 7PM-7AM, please contact night-coverage [email or web address] Password [facility]  02/03/2018, 5:09 PM

## 2018-02-03 NOTE — ED Notes (Signed)
CBG 24 mg/dL reported to [place]

## 2018-02-03 NOTE — ED Provider Notes (Signed)
[facility] EMERGENCY DEPARTMENT Provider Note   CSN: [id number] Arrival date & time: 02/03/18  1018      History   Chief Complaint Chief Complaint  Patient presents with  . Hypoglycemia    HPI Albert Stanley is a 72 y.o. male.  72 year old male brought in by ambulance from home for low blood sugar.  Patient has a history of diabetes on oral agents here with low blood sugar since 2 AM.  He has eaten some food and EMS gave him to oral glucose packets prior to arrival.  Patient himself denies any complaints but on further questioning he does say that he said diarrhea for mostly a month.  He states he had some episodes of low blood sugar since this is happened.  He has a recent history of a fall and a subdural and was also recently in the hospital earlier this month for a fall which she sustained some facial injuries.  He denies any fever, shortness of breath abdominal pain.  He is Tuesday Thursday Saturday dialysis and did not go on Tuesday secondary to his diarrhea.  The history is provided by the patient and the EMS personnel.  Hypoglycemia  Initial blood sugar:  20s Onset quality:  Unable to specify Timing:  Intermittent Chronicity:  New Diabetic status:  Controlled with oral medications Current diabetic therapy:  Glucophage Context: recent illness   Relieved by:  Oral glucose Associated symptoms: no seizures, no shortness of breath and no vomiting     Past Medical History:  Diagnosis Date  . Arthritis    Osteoarthritis  . Asthma   . Bladder cancer ([place]) dx'd 1990   surg only  . CHF (congestive heart failure) ([place])   . Diabetes mellitus without complication ([place])   . ESRD (end stage renal disease) ([name])    Tues, Thurs, Sat dialysis  . GERD (gastroesophageal reflux disease)   . History of cardiac catheterization    a. LHC 5/17: no obstructive CAD  . History of DVT (deep vein thrombosis)    2003 ish  . History of nuclear stress test    a. Myoview  4/17: EF 39%, inf, inf-lat, apical inf, apical lat, apical scar, intermediate risk  . Hyperlipidemia   . Hypertension   . NICM (nonischemic cardiomyopathy) ([place])    a. Echo 2/17: mod LVH, EF 35-40%, inf-lat and inf-sept HK, mod MR, severe LAE, small pericardial effusion    Patient Active Problem List   Diagnosis Date Noted  . Hematuria 01/06/2018  . Labile blood pressure   . Essential hypertension   . Acute on chronic diastolic heart failure ([place])   . Type 2 diabetes mellitus with peripheral neuropathy ([name])   . Acute deep vein thrombosis (DVT) of femoral vein of right lower extremity ([place])   . Traumatic subdural hematoma ([place]) 12/16/2017  . ESRD (end stage renal disease) ([place])   . ESRD (end stage renal disease) on dialysis ([place])   . Dysphagia   . Acute blood loss anemia   . Anemia of chronic disease   . Chronic diastolic congestive heart failure ([place])   . Diabetes mellitus type 2 in nonobese ([name])   . Benign essential HTN   . Nonischemic cardiomyopathy ([place])   . Transaminitis   . Encounter for central line placement   . Endotracheally intubated   . Ventilator dependent ([place])   . Chronic renal impairment   . Chronic in-center hemodialysis status ([place])   . S/P craniotomy 12/05/2017  .  Hyperkalemia 12/02/2017  . Malnutrition of moderate degree 12/18/2016  . PVC's (premature ventricular contractions)   . Anemia associated with chronic renal failure   . End-stage renal disease on hemodialysis ([place]) 12/09/2016  . Dyslipidemia associated with type 2 diabetes mellitus ([place]) 12/09/2016  . Acute on chronic systolic and diastolic heart failure, NYHA class 1 ([place]) 12/09/2016  . 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 Type 2 diabetes mellitus ([place]) 12/11/2015  . Fluid overload 12/06/2015  . History of DVT (deep vein thrombosis) 12/04/2015  . Renal hematoma 12/04/2015  . H/O: gout 11/16/2013  . Trochanteric bursitis  10/17/2013  . 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 Lumbar degenerative disc disease 07/17/2013    Past Surgical History:  Procedure Laterality Date  . arthroscopic knee surgery Left   . AV FISTULA PLACEMENT Left 12/13/2015   Procedure: ARTERIOVENOUS (AV) FISTULA CREATION;  Surgeon: Angelia Mould, MD;  Location: [facility] OR;  Service: Vascular;  Laterality: Left;  . AV FISTULA PLACEMENT Left 12/15/2017   Procedure: INSERTION OF ARTERIOVENOUS (AV) GORE-TEX GRAFT ARM;  Surgeon: Serafina Mitchell, MD;  Location: [facility];  Service: Vascular;  Laterality: Left;  . BACK SURGERY     2 times  1960  . CARDIAC CATHETERIZATION N/A 02/28/2016   Procedure: Left Heart Cath and Coronary Angiography;  Surgeon: Larey Dresser, MD;  Location: [facility] CV LAB;  Service: Cardiovascular;  Laterality: N/A;  . CRANIOTOMY N/A 12/05/2017   Procedure: CRANIOTOMY HEMATOMA EVACUATION SUBDURAL;  Surgeon: Ashok Pall, MD;  Location: [facility];  Service: Neurosurgery;  Laterality: N/A;  . CYSTOSCOPY    . FISTULA SUPERFICIALIZATION Left 08/14/2016   Procedure: FISTULA SUPERFICIALIZATION LEFT UPPER ARM;  Surgeon: Angelia Mould, MD;  Location: [facility];  Service: Vascular;  Laterality: Left;  . INSERTION OF DIALYSIS CATHETER N/A 12/13/2015   Procedure: INSERTION OF DIALYSIS CATHETER;  Surgeon: Angelia Mould, MD;  Location: [facility];  Service: Vascular;  Laterality: N/A;  . INSERTION OF DIALYSIS CATHETER Left 05/07/2017   Procedure: INSERTION OF DIALYSIS CATHETER LEFT INTERNAL JUGULAR PLACEMENT;  Surgeon: Angelia Mould, MD;  Location: [facility];  Service: Vascular;  Laterality: Left;  . INSERTION OF DIALYSIS CATHETER Left 12/15/2017   Procedure: INSERTION OF DIALYSIS CATHETER;  Surgeon: Serafina Mitchell, MD;  Location: [facility] OR;  Service: Vascular;  Laterality: Left;  . IR IVC FILTER PLMT / S&I Burke Keels GUID/MOD SED  12/18/2017  . IR RADIOLOGIST EVAL & MGMT  05/12/2017  . JOINT REPLACEMENT Left    hip  . LIGATION OF COMPETING BRANCHES  OF ARTERIOVENOUS FISTULA Left 08/14/2016   Procedure: LIGATION OF COMPETING BRANCHES OF ARTERIOVENOUS FISTULA LEFT UPPER ARM;  Surgeon: Angelia Mould, MD;  Location: [facility];  Service: Vascular;  Laterality: Left;  . PERIPHERAL VASCULAR CATHETERIZATION Left 06/09/2016   Procedure: Fistulagram;  Surgeon: Serafina Mitchell, MD;  Location: [facility] LAB;  Service: Cardiovascular;  Laterality: Left;  ARM  . PERIPHERAL VASCULAR CATHETERIZATION Left 06/09/2016   Procedure: Peripheral Vascular Balloon Angioplasty;  Surgeon: Serafina Mitchell, MD;  Location: [facility] CV LAB;  Service: Cardiovascular;  Laterality: Left;  upper arm venous  . PERIPHERAL VASCULAR CATHETERIZATION Left 06/25/2016   Procedure: Fistulagram;  Surgeon: Serafina Mitchell, MD;  Location: [facility] CV LAB;  Service: Cardiovascular;  Laterality: Left;  . REVISON OF ARTERIOVENOUS FISTULA Left 05/07/2017   Procedure: REPAIR OF PSEUDOANEURYSM  LEFT BRACHIO-CEPHALIC  ARM ARTERIOVENOUS FISTULA;  Surgeon: Angelia Mould, MD;  [facility];  Service: Vascular;  Laterality: Left;        Home Medications    Prior to Admission medications   Medication Sig Start Date End Date Taking? Authorizing Provider  allopurinol (ZYLOPRIM) 300 MG tablet Take 300 mg by mouth daily.     [provider]  Amino Acids-Protein Hydrolys (FEEDING SUPPLEMENT, PRO-STAT SUGAR FREE 64,) LIQD Take 30 mLs by mouth 2 (two) times daily.    [provider]  amiodarone (PACERONE) 200 MG tablet Take 1 tablet (200 mg total) by mouth daily. 09/24/17   Larey Dresser, MD  bacitracin ointment Apply 1 application topically 2 (two) times daily. 01/18/18   Antonietta Breach, PA-C  bisacodyl (DULCOLAX) 10 MG suppository Place 10 mg rectally as needed for moderate constipation.    [provider]  bisacodyl (DULCOLAX) 5 MG EC tablet Take 5 mg by mouth at bedtime.    [provider]  calcitRIOL (ROCALTROL) 0.5 MCG capsule Take 1  capsule (0.5 mcg total) by mouth Every Tuesday,Thursday,and Saturday with dialysis. Medication given at dialysis on Tues Thurs Sat Patient not taking: Reported on 01/18/2018 01/04/18   Angiulli, Lavon Paganini, PA-C  diclofenac sodium (VOLTAREN) 1 % GEL Apply 2 g topically 3 (three) times daily. Apply to lower back, left hip and knee    [provider]  lactulose (CHRONULAC) 10 GM/15ML solution Take 10 g by mouth daily as needed for mild constipation.    [provider]  Melatonin 3 MG TABS Take 6 mg by mouth at bedtime.    [provider]  multivitamin (RENA-VIT) TABS tablet Take 1 tablet by mouth at bedtime.  12/20/15   [provider]  oxybutynin (DITROPAN) 5 MG tablet Take 5 mg by mouth 2 (two) times daily.    [provider]  oxybutynin (DITROPAN) 5 MG/5ML syrup Take 5 mLs (5 mg total) by mouth 2 (two) times daily. Patient not taking: Reported on 01/18/2018 01/04/18   Angiulli, Lavon Paganini, PA-C  oxyCODONE-acetaminophen (PERCOCET) 7.5-325 MG tablet Take 1 tablet by mouth every 6 (six) hours as needed for moderate pain. 01/07/18   Medina-Vargas, Monina C, NP  pantoprazole (PROTONIX) 40 MG tablet Take 40 mg by mouth at bedtime.    [provider]  polyethylene glycol (MIRALAX / GLYCOLAX) packet Take 17 g by mouth daily. Hold for loose stool    [provider]  sevelamer carbonate (RENVELA) 800 MG tablet Take 2 tablets (1,600 mg total) by mouth 3 (three) times daily with meals. 12/18/15   Geradine Girt, DO  Sodium Phosphates (RA SALINE ENEMA) 19-7 GM/118ML ENEM Place 1 each rectally as needed (for constipation).    [provider]    Family History Family History  Problem Relation Age of Onset  . Arthritis Mother   . Cancer Father   . Heart disease Father   . Hypertension Sister   . Alcohol abuse Brother   . Diabetes Daughter   . Heart attack Paternal Uncle     Social History Social History   Tobacco Use  . Smoking status:  Former Smoker    Years: 30.00    Last attempt to quit: 10/12/1998    Years since quitting: 19.3  . Smokeless tobacco: Never Used  Substance Use Topics  . Alcohol use: No    Alcohol/week: 0.0 oz    Comment: Used to drink on the weekends, quit 2 years ago  . Drug use: No  Allergies   Meperidine; Penicillins; Sulfa antibiotics; Sulfonamide derivatives; Lac bovis; Lactose intolerance (gi); Milk-related compounds; Foltx [elfolate plus]; Betadine [povidone iodine]; and Eggs or egg-derived products   Review of Systems Review of Systems  Constitutional: Negative for chills and fever.  HENT: Negative for sneezing and sore throat.   Respiratory: Negative for cough and shortness of breath.   Cardiovascular: Negative for chest pain.  Gastrointestinal: Positive for diarrhea. Negative for vomiting.  Genitourinary: Negative for frequency and hematuria.  Musculoskeletal: Positive for gait problem and joint swelling.  Skin: Negative for rash.  Neurological: Negative for seizures and headaches.     Physical Exam Updated Vital Signs There were no vitals taken for this visit.  Physical Exam  Constitutional: He appears well-developed and well-nourished.  HENT:  Head: Normocephalic.  Right Ear: External ear normal.  Left Ear: External ear normal.  Nose: Nose normal.  Mouth/Throat: Oropharynx is clear and moist.  Patient has some old bruising over his forehead and right cheek.  Eyes: Pupils are equal, round, and reactive to light. Conjunctivae and EOM are normal.  Neck: Normal range of motion. Neck supple.  Cardiovascular: Normal rate, regular rhythm, normal heart sounds and intact distal pulses.  Pulmonary/Chest: Effort normal. No respiratory distress. He has no wheezes. He has no rales.  Dialysis catheter left upper chest wall.  Abdominal: Soft. He exhibits no mass. There is no tenderness.  Musculoskeletal: He exhibits edema (3+ edema lower extremities up through thighs). He exhibits no  tenderness.  Neurological: He is alert. No cranial nerve deficit or sensory deficit. He exhibits normal muscle tone.  Skin: Skin is warm and dry. Capillary refill takes less than 2 seconds.  Psychiatric: He has a normal mood and affect.     ED Treatments / Results  Labs (all labs ordered are listed, but only abnormal results are displayed) Labs Reviewed  MRSA PCR SCREENING - Abnormal; Notable for the following components:      Result Value   MRSA by PCR POSITIVE (*)    All other components within normal limits  BASIC METABOLIC PANEL - Abnormal; Notable for the following components:   Chloride 100 (*)    CO2 16 (*)    Glucose, Bld 31 (*)    BUN 72 (*)    Creatinine, Ser 10.69 (*)    Calcium 7.8 (*)    GFR calc non Af Amer 4 (*)    GFR calc Af Amer 5 (*)    Anion gap 21 (*)    All other components within normal limits  CBC WITH DIFFERENTIAL/PLATELET - Abnormal; Notable for the following components:   RBC 2.82 (*)    Hemoglobin 8.5 (*)    HCT 25.1 (*)    RDW 17.3 (*)    Platelets 135 (*)    All other components within normal limits  COMPREHENSIVE METABOLIC PANEL - Abnormal; Notable for the following components:   Sodium 133 (*)    Chloride 99 (*)    CO2 17 (*)    Glucose, Bld 53 (*)    BUN 74 (*)    Creatinine, Ser 10.84 (*)    Calcium 7.4 (*)    Total Protein 5.7 (*)    Albumin 2.7 (*)    ALT 13 (*)    GFR calc non Af Amer 4 (*)    GFR calc Af Amer 5 (*)    Anion gap 17 (*)    All other components within normal limits  CBC - Abnormal; Notable for the  following components:   RBC 2.78 (*)    Hemoglobin 8.2 (*)    HCT 24.3 (*)    RDW 17.5 (*)    Platelets 146 (*)    All other components within normal limits  PHOSPHORUS - Abnormal; Notable for the following components:   Phosphorus 6.7 (*)    All other components within normal limits  BASIC METABOLIC PANEL - Abnormal; Notable for the following components:   Potassium 3.3 (*)    CO2 16 (*)    BUN 73 (*)     Creatinine, Ser 10.60 (*)    Calcium 7.5 (*)    GFR calc non Af Amer 4 (*)    GFR calc Af Amer 5 (*)    Anion gap 19 (*)    All other components within normal limits  GLUCOSE, CAPILLARY - Abnormal; Notable for the following components:   Glucose-Capillary 28 (*)    All other components within normal limits  GLUCOSE, CAPILLARY - Abnormal; Notable for the following components:   Glucose-Capillary 49 (*)    All other components within normal limits  GLUCOSE, CAPILLARY - Abnormal; Notable for the following components:   Glucose-Capillary 107 (*)    All other components within normal limits  GLUCOSE, CAPILLARY - Abnormal; Notable for the following components:   Glucose-Capillary 49 (*)    All other components within normal limits  GLUCOSE, CAPILLARY - Abnormal; Notable for the following components:   Glucose-Capillary 170 (*)    All other components within normal limits  I-STAT TROPONIN, ED - Abnormal; Notable for the following components:   Troponin i, poc 0.20 (*)    All other components within normal limits  CBG MONITORING, ED - Abnormal; Notable for the following components:   Glucose-Capillary 16 (*)    All other components within normal limits  CBG MONITORING, ED - Abnormal; Notable for the following components:   Glucose-Capillary 20 (*)    All other components within normal limits  CBG MONITORING, ED - Abnormal; Notable for the following components:   Glucose-Capillary 36 (*)    All other components within normal limits  CBG MONITORING, ED - Abnormal; Notable for the following components:   Glucose-Capillary 44 (*)    All other components within normal limits  CBG MONITORING, ED - Abnormal; Notable for the following components:   Glucose-Capillary 24 (*)    All other components within normal limits  CBG MONITORING, ED - Abnormal; Notable for the following components:   Glucose-Capillary 40 (*)    All other components within normal limits  CBG MONITORING, ED - Abnormal;  Notable for the following components:   Glucose-Capillary 24 (*)    All other components within normal limits  CBG MONITORING, ED - Abnormal; Notable for the following components:   Glucose-Capillary 33 (*)    All other components within normal limits  CBG MONITORING, ED - Abnormal; Notable for the following components:   Glucose-Capillary 58 (*)    All other components within normal limits  CBG MONITORING, ED - Abnormal; Notable for the following components:   Glucose-Capillary 19 (*)    All other components within normal limits  CBG MONITORING, ED - Abnormal; Notable for the following components:   Glucose-Capillary 45 (*)    All other components within normal limits  CBG MONITORING, ED - Abnormal; Notable for the following components:   Glucose-Capillary 32 (*)    All other components within normal limits  CBG MONITORING, ED - Abnormal; Notable for the following components:  Glucose-Capillary 61 (*)    All other components within normal limits  CBG MONITORING, ED - Abnormal; Notable for the following components:   Glucose-Capillary 39 (*)    All other components within normal limits  CBG MONITORING, ED - Abnormal; Notable for the following components:   Glucose-Capillary 16 (*)    All other components within normal limits  CBG MONITORING, ED - Abnormal; Notable for the following components:   Glucose-Capillary 28 (*)    All other components within normal limits  CBG MONITORING, ED - Abnormal; Notable for the following components:   Glucose-Capillary <10 (*)    All other components within normal limits  I-STAT CHEM 8, ED - Abnormal; Notable for the following components:   BUN 63 (*)    Creatinine, Ser 10.90 (*)    Glucose, Bld 38 (*)    Calcium, Ion 0.99 (*)    TCO2 20 (*)    Hemoglobin 9.9 (*)    HCT 29.0 (*)    All other components within normal limits  CBG MONITORING, ED - Abnormal; Notable for the following components:   Glucose-Capillary 28 (*)    All other  components within normal limits  CBG MONITORING, ED - Abnormal; Notable for the following components:   Glucose-Capillary 63 (*)    All other components within normal limits  TSH  BETA-HYDROXYBUTYRIC ACID  GLUCOSE, CAPILLARY  GLUCOSE, CAPILLARY  SULFONYLUREA HYPOGLYCEMICS PANEL, SERUM  INSULIN, RANDOM  C-PEPTIDE  PROINSULIN/INSULIN RATIO  CBG MONITORING, ED  CBG MONITORING, ED  CBG MONITORING, ED  CBG MONITORING, ED  CBG MONITORING, ED    EKG EKG Interpretation  Date/Time:  Thursday February 03 2018 10:29:27 EDT Ventricular Rate:  95 PR Interval:    QRS Duration: 109 QT Interval:  395 QTC Calculation: 497 R Axis:   -25 Text Interpretation:  Sinus tachycardia Multiform ventricular premature complexes Incomplete left bundle branch block Low voltage, precordial leads Anterior Q waves, possibly due to ILBBB Baseline wander in lead(s) I III aVL similar to prior 4/19 Confirmed by Aletta Edouard [phone]) on 02/03/2018 10:42:58 AM   Radiology Dg Chest Port 1 View  Result Date: 02/03/2018 CLINICAL DATA:  Central line placement EXAM: PORTABLE CHEST 1 VIEW COMPARISON:  02/03/2018, 12/24/2017 FINDINGS: Left-sided central venous catheter tip over the right atrium. Right-sided central venous catheter tip over the cavoatrial junction. Cardiomegaly with vascular congestion. No pleural effusion or focal opacity. No pneumothorax. Aortic atherosclerosis. IMPRESSION: 1. Right-sided central venous catheter tip overlies the cavoatrial region. No pneumothorax 2. Cardiomegaly with mild vascular congestion Electronically Signed   By: Donavan Foil M.D.   On: 02/03/2018 23:57   Dg Chest Port 1 View  Result Date: 02/03/2018 CLINICAL DATA:  Weakness EXAM: PORTABLE CHEST 1 VIEW COMPARISON:  12/24/2017 FINDINGS: Cardiac shadow is enlarged. Dialysis catheter is again seen. The lungs are well aerated bilaterally without focal infiltrate or sizable effusion. No acute bony abnormality is noted. IMPRESSION: No  active disease. Electronically Signed   By: Inez Catalina M.D.   On: 02/03/2018 10:50    Procedures .Critical Care Performed by: Hayden Rasmussen, MD Authorized by: Hayden Rasmussen, MD   Critical care provider statement:    Critical care time (minutes):  40   Critical care time was exclusive of:  Separately billable procedures and treating other patients   Critical care was necessary to treat or prevent imminent or life-threatening deterioration of the following conditions:  Metabolic crisis   Critical care was time spent personally by me on  the following activities:  Development of treatment plan with patient or surrogate, discussions with consultants, evaluation of patient's response to treatment, examination of patient, obtaining history from patient or surrogate, ordering and performing treatments and interventions, ordering and review of laboratory studies, ordering and review of radiographic studies, pulse oximetry, re-evaluation of patient's condition and review of old charts   (including critical care time)  Medications Ordered in ED Medications  dextrose 5 %-0.45 % sodium chloride infusion (has no administration in time range)     Initial Impression / Assessment and Plan / ED Course  I have reviewed the triage vital signs and the nursing notes.  Pertinent labs & imaging results that were available during my care of the patient were reviewed by me and considered in my medical decision making (see chart for details).  Clinical Course as of Feb 03 1322  Thu Feb 03, 2018  1039 Wife is here now unable to give a little more history.  He has had some labile blood sugars over the past few days.  Usually responds to oral medicines but he is been eating well and still has sugars in the 40s to 70s at home.  His sugar was 20 overnight and paramedics came and gave him some IV glucose.  It sounds like he did better and was not transported but that his sugars were low again and so they  called EMS and brought him to the ED.   [MB]  3570 He has been having diarrhea for about 10 days and reportedly by wife states they had a sample that was negative for C. difficile and the cultures are pending.  See any records of this in my system.   [MB]  1040 Repeat blood sugar here is 1 6 repeated again 20 given amp of D50.  Patient was alert during the entire time.  I have ordered an infusion of D5 half-normal.   [MB]  1221 Repeat blood sugar is again dropped.  Patient is assymptomatic.  Given half amp of D50.   [MB]  1779 Discussed with nephrology Dr. Joellyn Rued.  He asked if the admitting team would consult him if he is going to be here longer than an obvious for tomorrow.  If he is going be discharged tomorrow he can get dialysis at his home unit but otherwise he would arrange for dialysis while inpatient.   [MB]  4 Discussed with Ms. Renard Hamper from the hospitalist service.  They will admit to their service.   [MB]  1315 Repeat blood sugar again in the 40s.   [MB]    Clinical Course User Index [MB] Hayden Rasmussen, MD      Final Clinical Impressions(s) / ED Diagnoses   Final diagnoses:  Hypoglycemia  ESRD (end stage renal disease) ([place])  Encounter for central line placement    ED Discharge Orders    None       Hayden Rasmussen, MD 02/04/18 1011

## 2018-02-03 NOTE — Consult Note (Signed)
PULMONARY / CRITICAL CARE MEDICINE   Name: Albert Stanley MRN: [id number] DOB: 1945/11/13    ADMISSION DATE:  02/03/2018 CONSULTATION DATE:  02/03/2018  REFERRING MD:  Dr. Karmen Bongo  CHIEF COMPLAINT:  Hypoglycemia  HISTORY OF PRESENT ILLNESS: Patient is a 72 y.o male with ESRD on HD TTS (last HD session 4/20), HFrEF, DVT/PE s/p IVC filter, and diabetes mellitus on glipizide 2.5 mg QD who presented to the ED with persistent hypoglycemia of 48 hours durations. The patient and his wife at bedside provided the history. They noticed that the patient's CBG have been trending down over the past 48 hours and at 2AM on 4/25 the patient's wife found his diaphoretic and unconscious in the living room. She called EMS who found his to be hypoglycemic with a CBG of 20. He was treated in the field with adequate response and declined further evaluation. Shortly after the patient again became hypoglycemic and EMS was called a second time. This time he was brought to the ED for further evaluation. He has had persistent hypoglycemia since arrival in spite of several amps of D50 and a D10 drip.   The patient has never had any episodes like this in the past. He is unsure when he last took his glipizide and states that he was told to take it when his CBG was less than 80. His wife is also diabetes and is on glipizide, metformin, and insulin. They voice that there is no possibility medications were switched.   In addition to the above history the patient has been struggling with persistent diarrhea since coming home from a SNF. The diarrhea started on 4/7 and is described as frequent fowl smelling watery/mucusy stools. The patient has been evaluated by his PCP and tested for C. Diff (they report 1 of 2 tests as negative). He has had decreased oral intake during this period. He denies fevers, chills, N/V, abdominal pain, dysuria, hematuria, hematochezia, hematemesis, new rash, sick contact, recent travel.   PAST  MEDICAL HISTORY :  He  has a past medical history of Arthritis, Asthma, Bladder cancer ([place]) (dx'd 1990), CHF (congestive heart failure) ([place]), Diabetes mellitus without complication ([place]), ESRD (end stage renal disease) ([place]), GERD (gastroesophageal reflux disease), History of cardiac catheterization, History of DVT (deep vein thrombosis), History of nuclear stress test, Hyperlipidemia, Hypertension, and NICM (nonischemic cardiomyopathy) ([place]).  PAST SURGICAL HISTORY: He  has a past surgical history that includes Back surgery; Joint replacement (Left); arthroscopic knee surgery (Left); Cystoscopy; AV fistula placement (Left, 12/13/2015); Insertion of dialysis catheter (N/A, 12/13/2015); Cardiac catheterization (N/A, 02/28/2016); Cardiac catheterization (Left, 06/09/2016); Cardiac catheterization (Left, 06/09/2016); Cardiac catheterization (Left, 06/25/2016); Fistula superficialization (Left, 08/14/2016); Ligation of competing branches of arteriovenous fistula (Left, 08/14/2016); Revison of arteriovenous fistula (Left, 05/07/2017); Insertion of dialysis catheter (Left, 05/07/2017); IR Radiologist Eval & Mgmt (05/12/2017); Craniotomy (N/A, 12/05/2017); AV fistula placement (Left, 12/15/2017); Insertion of dialysis catheter (Left, 12/15/2017); and IR IVC FILTER PLMT / S&I /IMG GUID/MOD SED (12/18/2017).  Allergies  Allergen Reactions  . Meperidine Shortness Of Breath and Other (See Comments)    Increased heart rate Reaction to demerol  . Penicillins Other (See Comments)    "Knocks me out."   PATIENT HAS HAD A PCN REACTION WITH IMMEDIATE RASH, FACIAL/TONGUE/THROAT SWELLING, SOB, OR LIGHTHEADEDNESS WITH HYPOTENSION:  # # #  YES  >> SYNCOPE ### Has patient had a PCN reaction causing severe rash involving mucus membranes or skin necrosis: unknown PATIENT HAS HAD A PCN REACTION THAT REQUIRED TREATMENT AT  MD'S OFFICE Has patient had a PCN reaction occurring within the last 10 years: no   . Sulfa Antibiotics Shortness Of Breath  and Other (See Comments)    Increased heart rate  . Sulfonamide Derivatives Shortness Of Breath and Other (See Comments)    TACHYCARDIA  . [place] Diarrhea, Nausea And Vomiting and Other (See Comments)    Constipation Reaction to whole milk  . Lactose Intolerance (Gi) Diarrhea, Nausea And Vomiting and Other (See Comments)    Constipation Reaction to whole milk  . Milk-Related Compounds Diarrhea, Nausea And Vomiting and Other (See Comments)    Constipation Reaction to whole milk  . Foltx [Elfolate Plus]     UNSPECIFIED REACTION  "Pt does not recall reaction to this"  . Betadine [Povidone Iodine] Itching, Rash and Other (See Comments)    burning  . Eggs Or Egg-Derived Products Nausea And Vomiting    Reaction to egg yolks - not whites     No current facility-administered medications on file prior to encounter.    Current Outpatient Medications on File Prior to Encounter  Medication Sig  . acetaminophen (TYLENOL) 500 MG tablet Take 500 mg by mouth every 6 (six) hours as needed for mild pain.  Marland Kitchen allopurinol (ZYLOPRIM) 300 MG tablet Take 300 mg by mouth daily.   . Amino Acids-Protein Hydrolys (FEEDING SUPPLEMENT, PRO-STAT SUGAR FREE 64,) LIQD Take 30 mLs by mouth 2 (two) times daily.  Marland Kitchen amiodarone (PACERONE) 200 MG tablet Take 1 tablet (200 mg total) by mouth daily.  . bacitracin ointment Apply 1 application topically 2 (two) times daily.  . bisacodyl (DULCOLAX) 5 MG EC tablet Take 5 mg by mouth at bedtime.  . diclofenac sodium (VOLTAREN) 1 % GEL Apply 2 g topically 3 (three) times daily. Apply to lower back, left hip and knee  . diphenhydrAMINE (BENADRYL) 25 MG tablet Take 25 mg by mouth every 6 (six) hours as needed for allergies.  Marland Kitchen glipiZIDE (GLUCOTROL XL) 2.5 MG 24 hr tablet Take 2.5 mg by mouth daily with breakfast.  . lactulose (CHRONULAC) 10 GM/15ML solution Take 10 g by mouth daily as needed for mild constipation.  Marland Kitchen liver oil-zinc oxide (DESITIN) 40 % ointment Apply 1  application topically daily as needed for irritation.  . Melatonin 3 MG TABS Take 6 mg by mouth at bedtime.  . multivitamin (RENA-VIT) TABS tablet Take 1 tablet by mouth at bedtime.   Marland Kitchen neomycin-bacitracin-polymyxin (NEOSPORIN) [phone] ointment Apply 1 application topically daily as needed (area on scalp).  Marland Kitchen oxybutynin (DITROPAN) 5 MG tablet Take 5 mg by mouth 2 (two) times daily.  Marland Kitchen oxyCODONE-acetaminophen (PERCOCET) 7.5-325 MG tablet Take 1 tablet by mouth every 6 (six) hours as needed for moderate pain.  . pantoprazole (PROTONIX) 40 MG tablet Take 40 mg by mouth at bedtime.  . polyethylene glycol (MIRALAX / GLYCOLAX) packet Take 17 g by mouth daily. Hold for loose stool  . sevelamer carbonate (RENVELA) 800 MG tablet Take 2 tablets (1,600 mg total) by mouth 3 (three) times daily with meals.   FAMILY HISTORY:  His indicated that his mother is deceased. He indicated that his father is deceased. He indicated that the status of his sister is unknown. He indicated that the status of his brother is unknown. He indicated that the status of his daughter is unknown. He indicated that the status of his paternal uncle is unknown.  SOCIAL HISTORY: He  reports that he quit smoking about 19 years ago. He quit after 30.00  years of use. He has never used smokeless tobacco. He reports that he does not drink alcohol or use drugs.  REVIEW OF SYSTEMS:   12 point ROS preformed. All negative aside from those mentioned in the HPI.  SUBJECTIVE:  Patient currently feels well. His CBG was 32 while we were in the room and the patient was mentating well.  VITAL SIGNS: BP 132/85   Pulse 95   Resp 16   Ht 5\' 9"  (1.753 m)   Wt 211 lb (95.7 kg)   SpO2 100%   BMI 31.16 kg/m   HEMODYNAMICS:    VENTILATOR SETTINGS:    INTAKE / OUTPUT: No intake/output data recorded.  PHYSICAL EXAMINATION: General: Obese male in no acute distress HENT: Normocephalic, atraumatic, moist mucus membranes, orbital  swelling Pulm: Good air movement with no wheezing or crackles  CV: Distant heart sounds, no murmurs, no rubs  Abdomen: Active bowel sounds, soft, non-distended, no tenderness to palpation  Extremities: Pulses palpable in all extremities, severe pitting edema bilaterally  Skin: Warm and dry  Neuro: Alert and oriented x 3  LABS: BMET Recent Labs  Lab 02/03/18 1022  NA 137  K 3.5  CL 100*  CO2 16*  BUN 72*  CREATININE 10.69*  GLUCOSE 31*   Electrolytes Recent Labs  Lab 02/03/18 1022  CALCIUM 7.8*   CBC Recent Labs  Lab 02/03/18 1022  WBC 5.7  HGB 8.5*  HCT 25.1*  PLT 135*   Coag's No results for input(s): APTT, INR in the last 168 hours.  Sepsis Markers No results for input(s): LATICACIDVEN, PROCALCITON, O2SATVEN in the last 168 hours.  ABG No results for input(s): PHART, PCO2ART, PO2ART in the last 168 hours.  Liver Enzymes No results for input(s): AST, ALT, ALKPHOS, BILITOT, ALBUMIN in the last 168 hours.  Cardiac Enzymes No results for input(s): TROPONINI, PROBNP in the last 168 hours.  Glucose Recent Labs  Lab 02/03/18 1639 02/03/18 1735 02/03/18 1812 02/03/18 1847 02/03/18 1919 02/03/18 1957  GLUCAP 74 33* 58* 19* 65 45*   Imaging Dg Chest Port 1 View  Result Date: 02/03/2018 CLINICAL DATA:  Weakness EXAM: PORTABLE CHEST 1 VIEW COMPARISON:  12/24/2017 FINDINGS: Cardiac shadow is enlarged. Dialysis catheter is again seen. The lungs are well aerated bilaterally without focal infiltrate or sizable effusion. No acute bony abnormality is noted. IMPRESSION: No active disease. Electronically Signed   By: Inez Catalina M.D.   On: 02/03/2018 10:50   CULTURES: GI Pathogen panel pending   ANTIBIOTICS: No antibiotics given.  LINES/TUBES: Right peripheral IV  Left tunneled IJ cathetere  DISCUSSION: Discussed the plan to continue tight CBG monitoring with the patient and his wife. All questions and concerns addressed.   ASSESSMENT / PLAN:  Patient  is a 72 y.o male with ESRD on HD TTS (last HD session 4/20), HFrEF, DVT/PE s/p IVC filter, and diabetes mellitus on glipizide 2.5 mg QD who presented to the ED with persistent hypoglycemia of 48 hours durations.  Hypoglycemia - Likely due to sulfonylurea use in  ESRD patient  - Switch to D25 IV, but ultimately needs HD - Checking insulin, c-peptide, TSH, and sulfonylurea screen  Diarrhea  - Since resolved.  - GI pathogen panel and C. diff ordered per primary   ESRD on HD TTS - Nephrology onboard  - Plan is for HD once CBG >70   Rest per primary. Will sign off. Please re-consult if any major changes.   P[facility] and [place] Pager: [phone]  02/03/2018, 8:12 PM

## 2018-02-03 NOTE — ED Notes (Addendum)
Spk with Santiago Glad, NP in reference to pt low BS and bed change request. NP instructed to change rate on D10 infusion to 38mL/hr and administer another amp of D50.

## 2018-02-04 ENCOUNTER — Encounter (HOSPITAL_COMMUNITY): Payer: Self-pay

## 2018-02-04 DIAGNOSIS — T383X1A Poisoning by insulin and oral hypoglycemic [antidiabetic] drugs, accidental (unintentional), initial encounter: Secondary | ICD-10-CM

## 2018-02-04 DIAGNOSIS — I5032 Chronic diastolic (congestive) heart failure: Secondary | ICD-10-CM

## 2018-02-04 DIAGNOSIS — E162 Hypoglycemia, unspecified: Secondary | ICD-10-CM

## 2018-02-04 DIAGNOSIS — E119 Type 2 diabetes mellitus without complications: Secondary | ICD-10-CM

## 2018-02-04 DIAGNOSIS — N189 Chronic kidney disease, unspecified: Secondary | ICD-10-CM

## 2018-02-04 DIAGNOSIS — E160 Drug-induced hypoglycemia without coma: Secondary | ICD-10-CM | POA: Diagnosis present

## 2018-02-04 DIAGNOSIS — N186 End stage renal disease: Secondary | ICD-10-CM

## 2018-02-04 DIAGNOSIS — D631 Anemia in chronic kidney disease: Secondary | ICD-10-CM

## 2018-02-04 LAB — GLUCOSE, CAPILLARY
GLUCOSE-CAPILLARY: 49 mg/dL — AB (ref 65–99)
GLUCOSE-CAPILLARY: 49 mg/dL — AB (ref 65–99)
GLUCOSE-CAPILLARY: 72 mg/dL (ref 65–99)
GLUCOSE-CAPILLARY: 172 mg/dL — AB (ref 65–99)
GLUCOSE-CAPILLARY: 158 mg/dL — AB (ref 65–99)
Glucose-Capillary: 28 mg/dL — CL (ref 65–99)
Glucose-Capillary: 107 mg/dL — ABNORMAL HIGH (ref 65–99)
Glucose-Capillary: 84 mg/dL (ref 65–99)
Glucose-Capillary: 170 mg/dL — ABNORMAL HIGH (ref 65–99)
Glucose-Capillary: 104 mg/dL — ABNORMAL HIGH (ref 65–99)
Glucose-Capillary: 85 mg/dL (ref 65–99)
Glucose-Capillary: 159 mg/dL — ABNORMAL HIGH (ref 65–99)

## 2018-02-04 LAB — COMPREHENSIVE METABOLIC PANEL
ALK PHOS: 69 U/L (ref 38–126)
ALT: 13 U/L — AB (ref 17–63)
ANION GAP: 17 — AB (ref 5–15)
AST: 16 U/L (ref 15–41)
Albumin: 2.7 g/dL — ABNORMAL LOW (ref 3.5–5.0)
BUN: 74 mg/dL — ABNORMAL HIGH (ref 6–20)
CALCIUM: 7.4 mg/dL — AB (ref 8.9–10.3)
CO2: 17 mmol/L — AB (ref 22–32)
Chloride: 99 mmol/L — ABNORMAL LOW (ref 101–111)
Creatinine, Ser: 10.84 mg/dL — ABNORMAL HIGH (ref 0.61–1.24)
GFR calc Af Amer: 5 mL/min — ABNORMAL LOW (ref >60)
GFR calc non Af Amer: 4 mL/min — ABNORMAL LOW (ref >60)
GLUCOSE: 53 mg/dL — AB (ref 65–99)
Potassium: 3.5 mmol/L (ref 3.5–5.1)
SODIUM: 133 mmol/L — AB (ref 135–145)
Total Bilirubin: 0.8 mg/dL (ref 0.3–1.2)
Total Protein: 5.7 g/dL — ABNORMAL LOW (ref 6.5–8.1)

## 2018-02-04 LAB — CBG MONITORING, ED
GLUCOSE-CAPILLARY: 28 mg/dL — AB (ref 65–99)
GLUCOSE-CAPILLARY: 63 mg/dL — AB (ref 65–99)
GLUCOSE-CAPILLARY: 71 mg/dL (ref 65–99)
Glucose-Capillary: 28 mg/dL — CL (ref 65–99)
Glucose-Capillary: <10 mg/dL — CL (ref 65–99)

## 2018-02-04 LAB — CBC
HEMATOCRIT: 24.3 % — AB (ref 39.0–52.0)
HEMOGLOBIN: 8.2 g/dL — AB (ref 13.0–17.0)
MCH: 29.5 pg (ref 26.0–34.0)
MCHC: 33.7 g/dL (ref 30.0–36.0)
MCV: 87.4 fL (ref 78.0–100.0)
Platelets: 146 10*3/uL — ABNORMAL LOW (ref 150–400)
RBC: 2.78 MIL/uL — ABNORMAL LOW (ref 4.22–5.81)
RDW: 17.5 % — AB (ref 11.5–15.5)
WBC: 5.3 10*3/uL (ref 4.0–10.5)

## 2018-02-04 LAB — MAGNESIUM: MAGNESIUM: 1.5 mg/dL — AB (ref 1.7–2.4)

## 2018-02-04 LAB — I-STAT CHEM 8, ED
BUN: 63 mg/dL — ABNORMAL HIGH (ref 6–20)
Calcium, Ion: 0.99 mmol/L — ABNORMAL LOW (ref 1.15–1.40)
Chloride: 101 mmol/L (ref 101–111)
Creatinine, Ser: 10.9 mg/dL — ABNORMAL HIGH (ref 0.61–1.24)
Glucose, Bld: 38 mg/dL — CL (ref 65–99)
HCT: 29 % — ABNORMAL LOW (ref 39.0–52.0)
Hemoglobin: 9.9 g/dL — ABNORMAL LOW (ref 13.0–17.0)
Potassium: 3.5 mmol/L (ref 3.5–5.1)
Sodium: 135 mmol/L (ref 135–145)
TCO2: 20 mmol/L — ABNORMAL LOW (ref 22–32)

## 2018-02-04 LAB — BETA-HYDROXYBUTYRIC ACID: Beta-Hydroxybutyric Acid: 0.05 mmol/L (ref 0.05–0.27)

## 2018-02-04 LAB — MRSA PCR SCREENING: MRSA by PCR: POSITIVE — AB

## 2018-02-04 LAB — PHOSPHORUS: PHOSPHORUS: 6.7 mg/dL — AB (ref 2.5–4.6)

## 2018-02-04 MED ORDER — HEPARIN SODIUM (PORCINE) 1000 UNIT/ML DIALYSIS: 100.0000 [IU]/kg | INTRAMUSCULAR | Status: DC | PRN

## 2018-02-04 MED ORDER — ORAL CARE MOUTH RINSE
15.0000 mL | Freq: Two times a day (BID) | OROMUCOSAL | Status: DC
  Administered 2018-02-04 – 2018-02-05 (×3): 15 mL via OROMUCOSAL

## 2018-02-04 MED ORDER — MUPIROCIN 2 % EX OINT
1.0000 "application " | TOPICAL_OINTMENT | Freq: Two times a day (BID) | CUTANEOUS | Status: DC
  Administered 2018-02-04 – 2018-02-06 (×6): 1 via NASAL
  Filled 2018-02-04 (×2): qty 22

## 2018-02-04 MED ORDER — OCTREOTIDE ACETATE 50 MCG/ML IJ SOLN
50.0000 ug | Freq: Four times a day (QID) | INTRAMUSCULAR | Status: DC
  Administered 2018-02-04 (×2): 50 ug via SUBCUTANEOUS
  Filled 2018-02-04 (×3): qty 1

## 2018-02-04 MED ORDER — LIDOCAINE-PRILOCAINE 2.5-2.5 % EX CREA: 1.0000 "application " | TOPICAL_CREAM | CUTANEOUS | Status: DC | PRN

## 2018-02-04 MED ORDER — HEPARIN SODIUM (PORCINE) 1000 UNIT/ML DIALYSIS: 1000.0000 [IU] | INTRAMUSCULAR | Status: DC | PRN

## 2018-02-04 MED ORDER — SODIUM CHLORIDE 0.9 % IV SOLN: 100.0000 mL | INTRAVENOUS | Status: DC | PRN

## 2018-02-04 MED ORDER — DARBEPOETIN ALFA 200 MCG/0.4ML IJ SOSY
PREFILLED_SYRINGE | INTRAMUSCULAR | Status: AC
  Administered 2018-02-04: 200 ug via INTRAVENOUS
  Filled 2018-02-04: qty 0.4

## 2018-02-04 MED ORDER — PENTAFLUOROPROP-TETRAFLUOROETH EX AERO: 1.0000 "application " | INHALATION_SPRAY | CUTANEOUS | Status: DC | PRN

## 2018-02-04 MED ORDER — ALTEPLASE 2 MG IJ SOLR: 2.0000 mg | Freq: Once | INTRAMUSCULAR | Status: DC | PRN

## 2018-02-04 MED ORDER — OCTREOTIDE ACETATE 50 MCG/ML IJ SOLN
50.0000 ug | Freq: Once | INTRAMUSCULAR | Status: AC
  Administered 2018-02-04: 50 ug via INTRAVENOUS
  Filled 2018-02-04: qty 0.5

## 2018-02-04 MED ORDER — DEXTROSE 50 % IV SOLN
INTRAVENOUS | Status: AC
Start: 2018-02-04 — End: 2018-02-04
  Administered 2018-02-04: 100 mL
  Filled 2018-02-04: qty 100

## 2018-02-04 MED ORDER — DEXTROSE 10 % IV SOLN
INTRAVENOUS | Status: DC
  Administered 2018-02-04 – 2018-02-06 (×4): via INTRAVENOUS

## 2018-02-04 MED ORDER — CHLORHEXIDINE GLUCONATE CLOTH 2 % EX PADS
6.0000 | MEDICATED_PAD | Freq: Every day | CUTANEOUS | Status: DC
  Administered 2018-02-04 – 2018-02-07 (×4): 6 via TOPICAL

## 2018-02-04 MED ORDER — LIDOCAINE HCL (PF) 1 % IJ SOLN: 5.0000 mL | INTRAMUSCULAR | Status: DC | PRN

## 2018-02-04 MED ORDER — MAGNESIUM SULFATE 2 GM/50ML IV SOLN
2.0000 g | Freq: Once | INTRAVENOUS | Status: AC
  Administered 2018-02-04: 2 g via INTRAVENOUS
  Filled 2018-02-04: qty 50

## 2018-02-04 NOTE — Progress Notes (Signed)
Assessment/Plan: 1.  Hypoglycemia - on infusion - per primary team  2.  ESRD -  TTS. Orders written for Sat 3.  Hypertension/volume  - BP controlled/Chronic LE edema, continue UF as tolerated. Midodrine pre HD  4. Hx SDH s/p craniotomy  5. Hx DVT s/p IVC filter  6. NICM/CHF    Subjective: Interval History: Getting D25%  Objective: Vital signs in last 24 hours: Temp:  [98 F (36.7 C)-98.3 F (36.8 C)] 98.3 F (36.8 C) (04/26 0734) Pulse Rate:  [78-171] 89 (04/26 1100) Resp:  [10-21] 12 (04/26 1100) BP: (97-141)/(56-107) 125/94 (04/26 1000) SpO2:  [74 %-100 %] 100 % (04/26 1100) Weight:  [216 lb 4.3 oz (98.1 kg)] 216 lb 4.3 oz (98.1 kg) (04/26 0330) Weight change:   Intake/Output from previous day: 04/25 0701 - 04/26 0700 In: 510.4 [I.V.:510.4] Out: -  Intake/Output this shift: Total I/O In: 300 [I.V.:300] Out: 0   General appearance: alert and cooperative Back: symmetric, no curvature. ROM normal. No CVA tenderness. Cardio: regular rate and rhythm, S1, S2 normal, no murmur, click, rub or gallop Extremities: edema 3-4+ BLE  Lab Results: Recent Labs    02/03/18 1022 02/04/18 0114 02/04/18 0345  WBC 5.7  --  5.3  HGB 8.5* 9.9* 8.2*  HCT 25.1* 29.0* 24.3*  PLT 135*  --  146*   BMET:  Recent Labs    02/03/18 2051 02/04/18 0114 02/04/18 0345  NA 136 135 133*  K 3.3* 3.5 3.5  CL 101 101 99*  CO2 16*  --  17*  GLUCOSE 82 38* 53*  BUN 73* 63* 74*  CREATININE 10.60* 10.90* 10.84*  CALCIUM 7.5*  --  7.4*   No results for input(s): PTH in the last 72 hours. Iron Studies: No results for input(s): IRON, TIBC, TRANSFERRIN, FERRITIN in the last 72 hours. Studies/Results: Dg Chest Port 1 View  Result Date: 02/03/2018 CLINICAL DATA:  Central line placement EXAM: PORTABLE CHEST 1 VIEW COMPARISON:  02/03/2018, 12/24/2017 FINDINGS: Left-sided central venous catheter tip over the right atrium. Right-sided central venous catheter tip over the cavoatrial junction.  Cardiomegaly with vascular congestion. No pleural effusion or focal opacity. No pneumothorax. Aortic atherosclerosis. IMPRESSION: 1. Right-sided central venous catheter tip overlies the cavoatrial region. No pneumothorax 2. Cardiomegaly with mild vascular congestion Electronically Signed   By: Donavan Foil M.D.   On: 02/03/2018 23:57   Dg Chest Port 1 View  Result Date: 02/03/2018 CLINICAL DATA:  Weakness EXAM: PORTABLE CHEST 1 VIEW COMPARISON:  12/24/2017 FINDINGS: Cardiac shadow is enlarged. Dialysis catheter is again seen. The lungs are well aerated bilaterally without focal infiltrate or sizable effusion. No acute bony abnormality is noted. IMPRESSION: No active disease. Electronically Signed   By: Inez Catalina M.D.   On: 02/03/2018 10:50    Scheduled: . allopurinol  300 mg Oral Daily  . amiodarone  200 mg Oral Daily  . Chlorhexidine Gluconate Cloth  6 each Topical Q0600  . Darbepoetin Alfa      . darbepoetin (ARANESP) injection - DIALYSIS  200 mcg Intravenous Q Thu-HD  . diclofenac sodium  2 g Topical TID  . feeding supplement (PRO-STAT SUGAR FREE 64)  30 mL Oral BID  . heparin  5,000 Units Subcutaneous Q8H  . mouth rinse  15 mL Mouth Rinse BID  . multivitamin  1 tablet Oral QHS  . mupirocin ointment  1 application Nasal BID  . octreotide  50 mcg Subcutaneous Q6H  . oxybutynin  5 mg Oral BID  .  pantoprazole  40 mg Oral QHS  . sevelamer carbonate  1,600 mg Oral TID WC      LOS: 1 day   Estanislado Emms 02/04/2018,11:20 AM

## 2018-02-04 NOTE — Progress Notes (Signed)
Arrived to patient room 79M-14 at 1030.  Reviewed treatment plan and this RN agrees with plan.  Report received from bedside RN, Sheleigh.  Consent obtained.  Patient A & O X 4.   Lung sounds diminished to ausculation in all fields. BLE +3 pitting edema. Cardiac:  NSR.  Removed caps and cleansed LIJ catheter with chlorhedxidine.  Aspirated ports of heparin and flushed them with saline per protocol.  Connected and secured lines, initiated treatment at 1050.  UF Goal of 4500 mL and net fluid removal 4 L.  Will continue to monitor.

## 2018-02-04 NOTE — Procedures (Addendum)
Central Venous Catheter Insertion Procedure Note Albert Stanley [id number] [birth year]  Procedure: Insertion of Central Venous Catheter Indications: Drug and/or fluid administration  Procedure Details Consent: Risks of procedure as well as the alternatives and risks of each were explained to the (patient/caregiver).  Consent for procedure obtained. Time Out: Verified patient identification, verified procedure, site/side was marked, verified correct patient position, special equipment/implants available, medications/allergies/relevent history reviewed, required imaging and test results available.  Performed  Maximum sterile technique was used including antiseptics, cap, gloves, gown, hand hygiene, mask and sheet. Skin prep: Chlorhexidine; local anesthetic administered A antimicrobial bonded/coated triple lumen catheter was placed in the right subclavian vein using the Seldinger technique.  Evaluation Blood flow good Complications: No apparent complications Patient did tolerate procedure well. Chest X-ray ordered to verify placement.  CXR: pending.  Albert Stanley 02/04/2018, 12:04 AM

## 2018-02-04 NOTE — Progress Notes (Signed)
PULMONARY  / CRITICAL CARE MEDICINE  Name: Albert Stanley MRN: [id number] DOB: 01-15-1946    LOS: 62  REFERRING MD :  Dr. Karmen Bongo  CHIEF COMPLAINT:  Refractory hypoglycemia   BRIEF PATIENT DESCRIPTION: 72 y.o male with ESRD on HD TTS (last HD session 4/20), HFrEF, DVT/PE s/p IVC filter, and diabetes mellitus on glipizide 2.5 mg QD who presented to the ED with persistent hypoglycemia of 48 hours durations after missing HD for several days while continuing to take home sulfonylurea. He continued to have persistent hypoglycemia despite D50 pushes and D10 and required admission to the ICU for D25 infusion through central line as well as HD.   LINES / TUBES: 4/25 RUE PIV and R subclavian CVC   CULTURES: None   ANTIBIOTICS: None   SIGNIFICANT EVENTS:  4/25 - Admitted to the ICU for refractory hypoglycemia requiring D50 through centra line    INTERVAL HISTORY: Alert and oriented this morning. Feels sleepy but better than yesterday. No complaints this morning. Denies abdominal pain and diarrhea.   VITAL SIGNS: Temp:  [98 F (36.7 C)-98.3 F (36.8 C)] 98.3 F (36.8 C) (04/26 0734) Pulse Rate:  [78-171] 82 (04/26 0700) Resp:  [10-21] 18 (04/26 0700) BP: (97-141)/(56-107) 132/78 (04/26 0700) SpO2:  [74 %-100 %] 100 % (04/26 0700) Weight:  [211 lb (95.7 kg)-216 lb 4.3 oz (98.1 kg)] 216 lb 4.3 oz (98.1 kg) (04/26 0330) HEMODYNAMICS:   VENTILATOR SETTINGS:   INTAKE / OUTPUT: Intake/Output      04/25 0701 - 04/26 0700 04/26 0701 - 04/27 0700   I.V. (mL/kg) 510.4 (5.2)    Total Intake(mL/kg) 510.4 (5.2)    Net +510.4           PHYSICAL EXAMINATION: General:  Elderly male resting comfortably in bed, arouses to voice  Neuro:  Alert and oriented, able to move all 4 extremities spontaneously and purposefully, no focal deficits noted  HEENT:  NCAT, EOMI, PEERL, periorbital edema, OP clear  Cardiovascular:  RRR, no mrg  Lungs: coarse breath sounds bilaterally, no wheezes, no  increased work of breathing  Abdomen:  Soft, NTND, normoactive bowel sounds  Musculoskeletal:  3+ LE edema bilaterally  Skin: no rashes or lesions noted    LABS: Cbc Recent Labs  Lab 02/03/18 1022 02/04/18 0114 02/04/18 0345  WBC 5.7  --  5.3  HGB 8.5* 9.9* 8.2*  HCT 25.1* 29.0* 24.3*  PLT 135*  --  146*    Chemistry  Recent Labs  Lab 02/03/18 1022 02/03/18 2051 02/04/18 0114 02/04/18 0345  NA 137 136 135 133*  K 3.5 3.3* 3.5 3.5  CL 100* 101 101 99*  CO2 16* 16*  --  17*  BUN 72* 73* 63* 74*  CREATININE 10.69* 10.60* 10.90* 10.84*  CALCIUM 7.8* 7.5*  --  7.4*  PHOS  --   --   --  6.7*  GLUCOSE 31* 82 38* 53*    Liver fxn Recent Labs  Lab 02/04/18 0345  AST 16  ALT 13*  ALKPHOS 69  BILITOT 0.8  PROT 5.7*  ALBUMIN 2.7*   coags No results for input(s): APTT, INR in the last 168 hours. Sepsis markers No results for input(s): LATICACIDVEN, PROCALCITON in the last 168 hours. Cardiac markers No results for input(s): CKTOTAL, CKMB, TROPONINI in the last 168 hours. BNP No results for input(s): PROBNP in the last 168 hours. ABG Recent Labs  Lab 02/04/18 0114  TCO2 20*    CBG trend Recent Labs  Lab 02/04/18 0339 02/04/18 0406 02/04/18 0409 02/04/18 0520 02/04/18 0632  GLUCAP 49* 28* 49* 72 107*    IMAGING: CXR with cardiomegaly with vascular congestion   ECG: 4/24 - NSR, incomplete LBBB, multiple PVCs, Q waves V1-V2, no signs of acute ischemia   DIAGNOSES: Principal Problem:   Hypoglycemia Active Problems:   Anemia associated with chronic renal failure   Chronic diastolic congestive heart failure ([name])   Diabetes mellitus type 2 in nonobese [facility])   Essential hypertension   Hypoglycemia secondary to sulfonylurea   ASSESSMENT / PLAN:  PULMONARY A: No acute issues - Currently stable on 2L of supplemental oxygen   Plan:  - Wean to room air as tolerated   CARDIOVASCULAR A: HFrEF with EF 25%, G1DD, moderate  MR, and diffuse  hypokinesis.  PLAN:  - Continue home amiodarone  - MAP goal > 65 - Volume overloaded on exam - Plan for HD today   RENAL A:   ESRD on HD - last HD session 4/20  PLAN:   - Plan for HD today    GASTROINTESTINAL A:  Diarrhea - resolved, no diarrhea x 2 days  SUP  PLAN:   - Continue home pantoprazole 40 mg QD   HEMATOLOGIC A:  Anemia of chronic disease -  Stable  R femoral DVT s/p IVC filter   PLAN:  - No anticoagulation in the setting of recent R SDH requiring craniotomy 12/05/2017  INFECTIOUS A:  No acute issues   ENDOCRINE A:  Sulfonylurea-induced hypoglycemia  T2DM     PLAN:   - Continue D25 and CBG monitoring q2h  - Holding home hypoglycemic agents - Add SSI-S   NEUROLOGIC A:   Recent right SDH s/p craniotomy 12/05/17 Alert and oriented   PLAN:  - No active issues    Welford Roche, MD  Internal Medicine PGY-1  P [phone] 02/04/2018, 7:52 AM

## 2018-02-04 NOTE — Progress Notes (Addendum)
Contacted by RN with concerns of multiple episodes of hypoglycemia despite D10 MIVF, 3 administrations of D50 amps and constant PO intake. Pt BS still dropping to the low 20's. Concern that patient is not appropriate for SDU d/t need for constant monitoring of CBG. Will consult critical care for further evaluation.  Hypoglycemia Will increase MIVF.  Patient currently on sulfonylurea and needs HD to help metabolize drug.  Consulted nephrology. Informed HD will need to be done at the bedside in SDU or ICU. Awaiting a bed for pt. Consult critical care for further eval.  Lovey Newcomer, NP [facility] 7p-7a [phone]

## 2018-02-04 NOTE — Progress Notes (Signed)
Dialysis treatment completed.  4000 mL ultrafiltrated.  3500 mL net fluid removal.  Patient status unchanged. Lung sounds diminshed to ausculation in all fields. BLE +3 pitting edema. Cardiac: NSR.  Cleansed LIJ catheter with chlorhexidine.  Disconnected lines and flushed ports with saline per protocol.  Ports locked with heparin and capped per protocol.    Report given to bedside, RN Sheleigh.

## 2018-02-05 ENCOUNTER — Other Ambulatory Visit: Payer: Self-pay

## 2018-02-05 LAB — GLUCOSE, CAPILLARY
GLUCOSE-CAPILLARY: 178 mg/dL — AB (ref 65–99)
GLUCOSE-CAPILLARY: 209 mg/dL — AB (ref 65–99)
GLUCOSE-CAPILLARY: 225 mg/dL — AB (ref 65–99)
Glucose-Capillary: 160 mg/dL — ABNORMAL HIGH (ref 65–99)
Glucose-Capillary: 185 mg/dL — ABNORMAL HIGH (ref 65–99)
Glucose-Capillary: 160 mg/dL — ABNORMAL HIGH (ref 65–99)

## 2018-02-05 MED ORDER — ACETAMINOPHEN 500 MG PO TABS: 500.0000 mg | ORAL_TABLET | Freq: Four times a day (QID) | ORAL | Status: DC | PRN

## 2018-02-05 NOTE — Progress Notes (Addendum)
[place] TEAM 1 - Stepdown/ICU TEAM  Albert Stanley  MLY:[id number] DOB: [birth year] DOA: 02/03/2018 PCP: [name], MD    Brief Narrative:  72yo M w/ ESRD on dialysis, DVT, and DM with a recent admission in February for subdural hematoma and required IVC filter placement for a R femoral DVT.  He was discharged to a rehab facility and then discharged back home.  He was on glipizide and was admitted for severe persistent hypoglycemia.  Significant Events: 4/25 admit 4/27 TRH resumed care   Subjective: Resting comfortably in bed.  Denies cp, sob, n/v, or abdom pain.  Reports that he is very weak and can not support his own weight at this time.    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 Due to mechanical fall - resume PT/OT   DVT s/p IVC filter  no anticoagulation in the setting of SDH requiring craniotomy 12/05/2017 - R femoral and posterior tibial - prior hx of DVT 8127  Chronic systolic and diastolic congestive heart failure TTE June 2018 noted EF 25-30% and grade 1 DD - volume control per HD   Filed Weights   02/04/18 1045 02/04/18 1505 02/05/18 0500  Weight: 98.1 kg (216 lb 4.3 oz) 95.6 kg (210 lb 12.2 oz) 98.8 kg (217 lb 13 oz)    HTN BP well controlled  Hypomagnesemia  Supplemented yesterday - f/u level in AM  Anemia of chronic renal failure Hgb stable   Hx of bladder CA s/p bladder resection 1999 Chronic foley cath   MRSA screen +  DVT prophylaxis: SQ heparin  Code Status: FULL CODE Family Communication: no family present at time of exam  Disposition Plan: stable for tele bed - PT/OT - follow CBGs  Consultants:  PCCM Nephrology   Antimicrobials:  none  Objective: Blood pressure (!) 115/91, pulse 78,  temperature 98 F (36.7 C), temperature source Axillary, resp. rate 11, height 5\' 10"  (1.778 m), weight 98.8 kg (217 lb 13 oz), SpO2 100 %.  Intake/Output Summary (Last 24 hours) at 02/05/2018 0936 Last data filed at 02/05/2018 0800 Gross per 24 hour  Intake 1835 ml  Output 3500 ml  Net -1665 ml   Filed Weights   02/04/18 1045 02/04/18 1505 02/05/18 0500  Weight: 98.1 kg (216 lb 4.3 oz) 95.6 kg (210 lb 12.2 oz) 98.8 kg (217 lb 13 oz)    Examination: General: No acute respiratory distress Lungs: Clear to auscultation bilaterally without wheezes or crackles Cardiovascular: Regular rate and rhythm without murmur gallop or rub normal S1 and S2 Abdomen: Nontender, nondistended, soft, bowel sounds positive, no rebound, no ascites, no appreciable mass Extremities: No significant cyanosis, clubbing, or edema bilateral lower extremities  CBC: Recent Labs  Lab 02/03/18 1022 02/04/18 0114 02/04/18 0345  WBC 5.7  --  5.3  NEUTROABS 4.0  --   --   HGB 8.5* 9.9* 8.2*  HCT 25.1* 29.0* 24.3*  MCV 89.0  --  87.4  PLT 135*  --  517*   Basic Metabolic Panel: Recent Labs  Lab 02/03/18 1022 02/03/18 2051 02/04/18 0114 02/04/18 0345 02/04/18 1414  NA 137 136 135 133*  --   K 3.5 3.3* 3.5 3.5  --   CL 100* 101 101 99*  --  CO2 16* 16*  --  17*  --   GLUCOSE 31* 82 38* 53*  --   BUN 72* 73* 63* 74*  --   CREATININE 10.69* 10.60* 10.90* 10.84*  --   CALCIUM 7.8* 7.5*  --  7.4*  --   MG  --   --   --   --  1.5*  PHOS  --   --   --  6.7*  --    GFR: Estimated Creatinine Clearance: 7.3 mL/min (A) (by C-G formula based on SCr of 10.84 mg/dL (H)).  Liver Function Tests: Recent Labs  Lab 02/04/18 0345  AST 16  ALT 13*  ALKPHOS 69  BILITOT 0.8  PROT 5.7*  ALBUMIN 2.7*    HbA1C: Hgb A1c MFr Bld  Date/Time Value Ref Range Status  12/09/2016 09:45 PM 6.4 (H) 4.8 - 5.6 % Final    Comment:    (NOTE)         Pre-diabetes: 5.7 - 6.4         Diabetes: >6.4         Glycemic control  for adults with diabetes: <7.0   12/05/2015 06:16 AM 5.8 (H) 4.8 - 5.6 % Final    Comment:    (NOTE)         Pre-diabetes: 5.7 - 6.4         Diabetes: >6.4         Glycemic control for adults with diabetes: <7.0     CBG: Recent Labs  Lab 02/04/18 2033 02/04/18 2247 02/05/18 0000 02/05/18 0346 02/05/18 0848  GLUCAP 159* 158* 178* 160* 185*    Recent Results (from the past 240 hour(s))  MRSA PCR Screening     Status: Abnormal   Collection Time: 02/04/18  3:33 AM  Result Value Ref Range Status   MRSA by PCR POSITIVE (A) NEGATIVE Final    Comment:        The GeneXpert MRSA Assay (FDA approved for NASAL specimens only), is one component of a comprehensive MRSA colonization surveillance program. It is not intended to diagnose MRSA infection nor to guide or monitor treatment for MRSA infections. RESULT CALLED TO, READ BACK BY AND VERIFIED WITHEverlean Alstrom RN [phone] 02/04/18 A BROWNING Performed at [facility], [place] [address], [place], [place] [address]      Scheduled Meds: . allopurinol  300 mg Oral Daily  . amiodarone  200 mg Oral Daily  . Chlorhexidine Gluconate Cloth  6 each Topical Q0600  . darbepoetin (ARANESP) injection - DIALYSIS  200 mcg Intravenous Q Thu-HD  . diclofenac sodium  2 g Topical TID  . feeding supplement (PRO-STAT SUGAR FREE 64)  30 mL Oral BID  . heparin  5,000 Units Subcutaneous Q8H  . mouth rinse  15 mL Mouth Rinse BID  . multivitamin  1 tablet Oral QHS  . mupirocin ointment  1 application Nasal BID  . oxybutynin  5 mg Oral BID  . pantoprazole  40 mg Oral QHS  . sevelamer carbonate  1,600 mg Oral TID WC     LOS: 2 days   Cherene Altes, MD [facility] Office  [phone] Pager - Text Page per Amion as per below:  On-Call/Text Page:      [email or web address]      password [email or web address]  If 7PM-7AM, please contact night-coverage [email or web address] Password [facility] 02/05/2018, 9:36 AM

## 2018-02-05 NOTE — Progress Notes (Signed)
Assessment/Plan: 1. Hypoglycemia - improved 2. ESRD - TTS.  Had HD Friday and 3500off 3. Hypertension/volume - BP controlled/Chronic LE edema, continue UF as tolerated. Midodrine pre HD 4. Hx SDH s/p craniotomy  5. Hx DVT s/p IVC filter  6. NICM/CHF   Subjective: Interval History: Feels well today, tol HD yesterday--- now off schedule  Objective: Vital signs in last 24 hours: Temp:  [97.9 F (36.6 C)-99.3 F (37.4 C)] 98.1 F (36.7 C) (04/27 0800) Pulse Rate:  [76-120] 89 (04/27 1100) Resp:  [9-20] 14 (04/27 1100) BP: (93-174)/(37-151) 103/86 (04/27 1000) SpO2:  [69 %-100 %] 99 % (04/27 1100) Weight:  [95.6 kg (210 lb 12.2 oz)-98.8 kg (217 lb 13 oz)] 98.8 kg (217 lb 13 oz) (04/27 0500) Weight change: 2.391 kg (5 lb 4.3 oz)  Intake/Output from previous day: 04/26 0701 - 04/27 0700 In: 1860 [I.V.:1860] Out: 3500  Intake/Output this shift: Total I/O In: 75 [I.V.:75] Out: 0   General appearance: alert and cooperative Chest wall: no tenderness Cardio: regular rate and rhythm, S1, S2 normal, no murmur, click, rub or gallop Extremities: edema 3+  Lab Results: Recent Labs    02/03/18 1022 02/04/18 0114 02/04/18 0345  WBC 5.7  --  5.3  HGB 8.5* 9.9* 8.2*  HCT 25.1* 29.0* 24.3*  PLT 135*  --  146*   BMET:  Recent Labs    02/03/18 2051 02/04/18 0114 02/04/18 0345  NA 136 135 133*  K 3.3* 3.5 3.5  CL 101 101 99*  CO2 16*  --  17*  GLUCOSE 82 38* 53*  BUN 73* 63* 74*  CREATININE 10.60* 10.90* 10.84*  CALCIUM 7.5*  --  7.4*   No results for input(s): PTH in the last 72 hours. Iron Studies: No results for input(s): IRON, TIBC, TRANSFERRIN, FERRITIN in the last 72 hours. Studies/Results: Dg Chest Port 1 View  Result Date: 02/03/2018 CLINICAL DATA:  Central line placement EXAM: PORTABLE CHEST 1 VIEW COMPARISON:  02/03/2018, 12/24/2017 FINDINGS: Left-sided central venous catheter tip over the right atrium. Right-sided central venous catheter tip over the  cavoatrial junction. Cardiomegaly with vascular congestion. No pleural effusion or focal opacity. No pneumothorax. Aortic atherosclerosis. IMPRESSION: 1. Right-sided central venous catheter tip overlies the cavoatrial region. No pneumothorax 2. Cardiomegaly with mild vascular congestion Electronically Signed   By: Donavan Foil M.D.   On: 02/03/2018 23:57    Scheduled: . allopurinol  300 mg Oral Daily  . amiodarone  200 mg Oral Daily  . Chlorhexidine Gluconate Cloth  6 each Topical Q0600  . darbepoetin (ARANESP) injection - DIALYSIS  200 mcg Intravenous Q Thu-HD  . diclofenac sodium  2 g Topical TID  . feeding supplement (PRO-STAT SUGAR FREE 64)  30 mL Oral BID  . heparin  5,000 Units Subcutaneous Q8H  . mouth rinse  15 mL Mouth Rinse BID  . multivitamin  1 tablet Oral QHS  . mupirocin ointment  1 application Nasal BID  . oxybutynin  5 mg Oral BID  . pantoprazole  40 mg Oral QHS  . sevelamer carbonate  1,600 mg Oral TID WC     LOS: 2 days   Estanislado Emms 02/05/2018,11:48 AM

## 2018-02-06 LAB — GLUCOSE, CAPILLARY
GLUCOSE-CAPILLARY: 140 mg/dL — AB (ref 65–99)
GLUCOSE-CAPILLARY: 86 mg/dL (ref 65–99)
GLUCOSE-CAPILLARY: 110 mg/dL — AB (ref 65–99)
GLUCOSE-CAPILLARY: 93 mg/dL (ref 65–99)
Glucose-Capillary: 160 mg/dL — ABNORMAL HIGH (ref 65–99)
Glucose-Capillary: 171 mg/dL — ABNORMAL HIGH (ref 65–99)

## 2018-02-06 LAB — COMPREHENSIVE METABOLIC PANEL
ALT: 13 U/L — AB (ref 17–63)
AST: 13 U/L — ABNORMAL LOW (ref 15–41)
Albumin: 2.6 g/dL — ABNORMAL LOW (ref 3.5–5.0)
Alkaline Phosphatase: 64 U/L (ref 38–126)
Anion gap: 16 — ABNORMAL HIGH (ref 5–15)
BILIRUBIN TOTAL: 0.8 mg/dL (ref 0.3–1.2)
BUN: 62 mg/dL — ABNORMAL HIGH (ref 6–20)
CALCIUM: 7.5 mg/dL — AB (ref 8.9–10.3)
CHLORIDE: 90 mmol/L — AB (ref 101–111)
CO2: 19 mmol/L — ABNORMAL LOW (ref 22–32)
CREATININE: 8.74 mg/dL — AB (ref 0.61–1.24)
GFR, EST AFRICAN AMERICAN: 6 mL/min — AB (ref >60)
GFR, EST NON AFRICAN AMERICAN: 5 mL/min — AB (ref >60)
Glucose, Bld: 118 mg/dL — ABNORMAL HIGH (ref 65–99)
Potassium: 4.1 mmol/L (ref 3.5–5.1)
Sodium: 125 mmol/L — ABNORMAL LOW (ref 135–145)
TOTAL PROTEIN: 5.7 g/dL — AB (ref 6.5–8.1)

## 2018-02-06 LAB — CBC
HCT: 25.5 % — ABNORMAL LOW (ref 39.0–52.0)
Hemoglobin: 8.7 g/dL — ABNORMAL LOW (ref 13.0–17.0)
MCH: 30 pg (ref 26.0–34.0)
MCHC: 34.1 g/dL (ref 30.0–36.0)
MCV: 87.9 fL (ref 78.0–100.0)
PLATELETS: 144 10*3/uL — AB (ref 150–400)
RBC: 2.9 MIL/uL — AB (ref 4.22–5.81)
RDW: 17.2 % — AB (ref 11.5–15.5)
WBC: 6.6 10*3/uL (ref 4.0–10.5)

## 2018-02-06 LAB — C-PEPTIDE: C PEPTIDE: 11.2 ng/mL — AB (ref 1.1–4.4)

## 2018-02-06 LAB — MAGNESIUM: MAGNESIUM: 1.6 mg/dL — AB (ref 1.7–2.4)

## 2018-02-06 MED ORDER — MAGNESIUM SULFATE IN D5W 1-5 GM/100ML-% IV SOLN
1.0000 g | Freq: Once | INTRAVENOUS | Status: AC
  Administered 2018-02-06: 1 g via INTRAVENOUS
  Filled 2018-02-06: qty 100

## 2018-02-06 NOTE — Progress Notes (Signed)
Assessment/Plan: 1. Hypoglycemia - improved---off glipizide 2. ESRD - TTS.  Had HD Friday and 3500off 3. Hypertension/volume - BP controlled/Chronic LE edema, continue UF as tolerated. Midodrine pre HD 4. Hx SDH s/p craniotomy  5. Hx DVT s/p IVC filter  6. NICM/CHF   Subjective: Interval History: glucose stable  Objective: Vital signs in last 24 hours: Temp:  [97.4 F (36.3 C)-98.5 F (36.9 C)] 97.4 F (36.3 C) (04/28 0842) Pulse Rate:  [67-88] 84 (04/28 1100) Resp:  [10-19] 14 (04/28 1100) BP: (85-124)/(56-78) 121/78 (04/28 1000) SpO2:  [97 %-100 %] 100 % (04/28 1100) Weight:  [99.5 kg (219 lb 5.7 oz)] 99.5 kg (219 lb 5.7 oz) (04/28 0500) Weight change: 1.4 kg (3 lb 1.4 oz)  Intake/Output from previous day: 04/27 0701 - 04/28 0700 In: 1436.7 [P.O.:660; I.V.:776.7] Out: 0  Intake/Output this shift: Total I/O In: 513 [P.O.:473; I.V.:40] Out: 0   General appearance: alert and cooperative Extremities: edema 2+ BLE  Abd protub   Lab Results: Recent Labs    02/04/18 0345 02/06/18 0703  WBC 5.3 6.6  HGB 8.2* 8.7*  HCT 24.3* 25.5*  PLT 146* 144*   BMET:  Recent Labs    02/04/18 0345 02/06/18 0703  NA 133* 125*  K 3.5 4.1  CL 99* 90*  CO2 17* 19*  GLUCOSE 53* 118*  BUN 74* 62*  CREATININE 10.84* 8.74*  CALCIUM 7.4* 7.5*   No results for input(s): PTH in the last 72 hours. Iron Studies: No results for input(s): IRON, TIBC, TRANSFERRIN, FERRITIN in the last 72 hours. Studies/Results: No results found.  Scheduled: . allopurinol  300 mg Oral Daily  . amiodarone  200 mg Oral Daily  . Chlorhexidine Gluconate Cloth  6 each Topical Q0600  . darbepoetin (ARANESP) injection - DIALYSIS  200 mcg Intravenous Q Thu-HD  . diclofenac sodium  2 g Topical TID  . feeding supplement (PRO-STAT SUGAR FREE 64)  30 mL Oral BID  . heparin  5,000 Units Subcutaneous Q8H  . mouth rinse  15 mL Mouth Rinse BID  . multivitamin  1 tablet Oral QHS  . mupirocin ointment  1  application Nasal BID  . oxybutynin  5 mg Oral BID  . pantoprazole  40 mg Oral QHS  . sevelamer carbonate  1,600 mg Oral TID WC      LOS: 3 days   Estanislado Emms 02/06/2018,11:18 AM

## 2018-02-06 NOTE — Evaluation (Signed)
Occupational Therapy Evaluation and Discharge Patient Details Name: Albert Stanley MRN: [id number] DOB: 06-04-1946 Today's Date: 02/06/2018    History of Present Illness Pt admitted 02/03/18 with persistent hypoglycemia. 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   Clinical Impression   Pt is assisted for transfers and uses a w/c for mobility. He is dependent in ADL with exception of feeding and some grooming activities and has a full time aide to assist him. No acute OT needs, signing off.    Follow Up Recommendations  No OT follow up    Equipment Recommendations  None recommended by OT    Recommendations for Other Services       Precautions / Restrictions Precautions Precautions: Fall Precaution Comments: urostomy Restrictions Weight Bearing Restrictions: No      Mobility Bed Mobility Overal bed mobility: Needs Assistance Bed Mobility: Supine to Sit     Supine to sit: Mod assist     General bed mobility comments: assist of pad to initiate LEs/hips toward EOB and to raise trunk  Transfers Overall transfer level: Needs assistance Equipment used: 2 person hand held assist Transfers: Sit to/from Stand;Stand Pivot Transfers Sit to Stand: +2 physical assistance;Min assist Stand pivot transfers: +2 physical assistance;Mod assist       General transfer comment: assist to rise and steady, pt transferred toward L (weaker side) bed>chair    Balance Overall balance assessment: Needs assistance   Sitting balance-Leahy Scale: Fair       Standing balance-Leahy Scale: Poor                             ADL either performed or assessed with clinical judgement   ADL Overall ADL's : At baseline                                             Vision Baseline Vision/History: Wears glasses Wears Glasses: Reading only       Perception     Praxis       Pertinent Vitals/Pain Pain Assessment: No/denies pain     Hand Dominance Right   Extremity/Trunk Assessment Upper Extremity Assessment Upper Extremity Assessment: Overall WFL for tasks assessed;Generalized weakness   Lower Extremity Assessment Lower Extremity Assessment: Defer to PT evaluation       Communication Communication Communication: No difficulties   Cognition Arousal/Alertness: Awake/alert Behavior During Therapy: WFL for tasks assessed/performed Overall Cognitive Status: No family/caregiver present to determine baseline cognitive functioning                                 General Comments: likely at baseline cognition   General Comments       Exercises     Shoulder Instructions      Home Living Family/patient expects to be discharged to:: Private residence Living Arrangements: Spouse/significant other;Children Available Help at Discharge: Family;Personal care attendant;Available 24 hours/day Type of Home: House Home Access: Ramped entrance     Home Layout: Two level Alternate Level Stairs-Number of Steps: flight - has a chair lift   Bathroom Shower/Tub: Walk-in shower   Bathroom Toilet: Handicapped height Bathroom Accessibility: No   Home Equipment: Environmental consultant - 2 wheels;Bedside commode;Wheelchair - manual  Prior Functioning/Environment Level of Independence: Needs assistance  Gait / Transfers Assistance Needed: transfers only, assist to propel w/c ADL's / Homemaking Assistance Needed: self feeds and participates in grooming, otherwise dependent in sponge bathing, dressing, toileting and all IADL            OT Problem List:        OT Treatment/Interventions:      OT Goals(Current goals can be found in the care plan section) Acute Rehab OT Goals Patient Stated Goal: to go home  OT Frequency:     Barriers to D/C:            Co-evaluation PT/OT/SLP Co-Evaluation/Treatment: Yes Reason for Co-Treatment: For  patient/therapist safety PT goals addressed during session: Mobility/safety with mobility OT goals addressed during session: ADL's and self-care      AM-PAC PT "6 Clicks" Daily Activity     Outcome Measure Help from another person eating meals?: None Help from another person taking care of personal grooming?: A Little Help from another person toileting, which includes using toliet, bedpan, or urinal?: Total Help from another person bathing (including washing, rinsing, drying)?: Total Help from another person to put on and taking off regular upper body clothing?: A Lot Help from another person to put on and taking off regular lower body clothing?: Total 6 Click Score: 12   End of Session Equipment Utilized During Treatment: Gait belt Nurse Communication: Mobility status  Activity Tolerance: Patient tolerated treatment well Patient left: in chair;with call bell/phone within reach                   Time: 0817-0836 OT Time Calculation (min): 19 min Charges:  OT General Charges $OT Visit: 1 Visit OT Evaluation $OT Eval Moderate Complexity: 1 Mod G-Codes:     02-Mar-2018 Nestor Lewandowsky, OTR/L Pager: [place], Haze Boy[place] 2018/03/02, 9:22 AM

## 2018-02-06 NOTE — Evaluation (Signed)
Physical Therapy Evaluation Patient Details Name: Albert Stanley MRN: [id number] DOB: 16-Jan-1946 Today's Date: 02/06/2018   History of Present Illness  Pt admitted 02/03/18 with persistent hypoglycemia. 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    Clinical Impression  Pt admitted with above diagnosis. Pt currently with functional limitations due to the deficits listed below (see PT Problem List). On eval, pt required mod assist bed mobility, +2 min assist sit to stand, and +2 mod assist SPT toward left (weaker side).  Pt will benefit from skilled PT to increase their independence and safety with mobility to allow discharge to the venue listed below.       Follow Up Recommendations Home health PT;Supervision/Assistance - 24 hour    Equipment Recommendations  None recommended by PT    Recommendations for Other Services       Precautions / Restrictions Precautions Precautions: Fall Precaution Comments: urostomy Restrictions Weight Bearing Restrictions: No      Mobility  Bed Mobility Overal bed mobility: Needs Assistance Bed Mobility: Supine to Sit     Supine to sit: Mod assist     General bed mobility comments: assist of pad to initiate LEs/hips toward EOB and to raise trunk  Transfers Overall transfer level: Needs assistance Equipment used: 2 person hand held assist Transfers: Sit to/from Stand;Stand Pivot Transfers Sit to Stand: +2 physical assistance;Min assist Stand pivot transfers: +2 physical assistance;Mod assist       General transfer comment: verbal cues for hand placement and sequencing. Assist to power up. SPT bed to recliner toward L (weaker side).  Ambulation/Gait             General Gait Details: nonambulatory  Stairs            Wheelchair Mobility    Modified Rankin (Stroke Patients Only)       Balance Overall balance assessment: Needs  assistance Sitting-balance support: No upper extremity supported;Feet supported Sitting balance-Leahy Scale: Fair     Standing balance support: Bilateral upper extremity supported;During functional activity Standing balance-Leahy Scale: Poor                               Pertinent Vitals/Pain Pain Assessment: No/denies pain    Home Living Family/patient expects to be discharged to:: Private residence Living Arrangements: Spouse/significant other;Children Available Help at Discharge: Family;Available 24 hours/day;Personal care attendant(wife and daughter work during the day. PCA 8 hrs/day M-F.) Type of Home: House Home Access: Ramped entrance     Home Layout: Two level Home Equipment: Walker - 2 wheels;Crutches;Wheelchair - manual;Bedside commode      Prior Function Level of Independence: Needs assistance   Gait / Transfers Assistance Needed: assist for transfers. Mod I wheelchair mobility short distance. Dependent w/c mobility in community.  ADL's / Homemaking Assistance Needed: Wife/PCA assist with all ADLs. Feeds self with set up. Sponge bathes.        Hand Dominance   Dominant Hand: Right    Extremity/Trunk Assessment   Upper Extremity Assessment Upper Extremity Assessment: Defer to OT evaluation    Lower Extremity Assessment Lower Extremity Assessment: LLE deficits/detail;Generalized weakness LLE Deficits / Details: greater strength deficits on L due to R SDH 11/2017    Cervical / Trunk Assessment Cervical / Trunk Assessment: Kyphotic  Communication   Communication: No difficulties  Cognition Arousal/Alertness: Awake/alert Behavior During Therapy: [facility]  for tasks assessed/performed Overall Cognitive Status: Within Functional Limits for tasks assessed                                 General Comments: likely at baseline cognition      General Comments      Exercises     Assessment/Plan    PT Assessment Patient needs  continued PT services  PT Problem List Decreased strength;Decreased mobility;Decreased activity tolerance;Decreased balance       PT Treatment Interventions Therapeutic activities;Therapeutic exercise;Patient/family education;Balance training;Functional mobility training    PT Goals (Current goals can be found in the Care Plan section)  Acute Rehab PT Goals Patient Stated Goal: to go home PT Goal Formulation: With patient Time For Goal Achievement: 02/20/18 Potential to Achieve Goals: Good    Frequency Min 3X/week   Barriers to discharge        Co-evaluation PT/OT/SLP Co-Evaluation/Treatment: Yes Reason for Co-Treatment: For patient/therapist safety PT goals addressed during session: Mobility/safety with mobility OT goals addressed during session: ADL's and self-care       AM-PAC PT "6 Clicks" Daily Activity  Outcome Measure Difficulty turning over in bed (including adjusting bedclothes, sheets and blankets)?: A Lot Difficulty moving from lying on back to sitting on the side of the bed? : Unable Difficulty sitting down on and standing up from a chair with arms (e.g., wheelchair, bedside commode, etc,.)?: Unable Help needed moving to and from a bed to chair (including a wheelchair)?: A Lot Help needed walking in hospital room?: Total Help needed climbing 3-5 steps with a railing? : Total 6 Click Score: 8    End of Session Equipment Utilized During Treatment: Gait belt Activity Tolerance: Patient tolerated treatment well Patient left: in chair;with call bell/phone within reach Nurse Communication: Mobility status PT Visit Diagnosis: Other abnormalities of gait and mobility (R26.89);Muscle weakness (generalized) (M62.81)    Time: 0350-0938 PT Time Calculation (min) (ACUTE ONLY): 20 min   Charges:   PT Evaluation $PT Eval Moderate Complexity: 1 Mod     PT G Codes:        Lorrin Goodell, PT  Office # [phone] Pager [phone]   Lorriane Shire 02/06/2018, 9:28  AM

## 2018-02-06 NOTE — Progress Notes (Signed)
PROGRESS NOTE  Albert Stanley OIZ:[id number] DOB: [birth year] DOA: 02/03/2018 PCP: [name], MD  Brief Narrative:  89 mal ESRD TTS [facility] Since 12/3823 complicated by chronic hypotension on Midodrine--usual dry weight is 185 to 188 pounds,  DM TY 2,  admission 11/2017 SDH + R femoral DVT (IVC filter placed),  Chronic urinary retention status post trauma, known to have also PVC burden heart failure team saw the patient and admission prior to [place] 3/18, urinary retention with traumatic Foley in the past Other past medical history significant for renal mass/atypical cyst and bladder cancer status post resection 1999 Admit 02/03/2018 at nursing facility was found to have CBG 20 subsequent glucose 16 after Rx-refractory hypoglycemia thought to be secondary to persistence of the same and ESRD and placed on D10 infusion Nephrology consulted regarding ESRD  Assessment/Plan  Refractory hypoglycemia on admission-current CBGs are in the 160s to 220s-will discontinue D10 and monitor on renal diet-is appropriate at this time possibly for transfer back to skilled facility if can maintain sugars above 110-do not think any surreptitious causes versus more exotic causes are at play  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 candidate for anticoagulation-- poor candidate for this  Prior DVT 2003, DVT on admit 12/05/17--now has IVC  Bladder mass, chr foley-cont steroid  Hypomagnesemia-no labs since last  dialysis, replacing, replete if needed with next check  DVT prophylaxis: SCD no AC Code Status: Full  Family Communication: none Disposition Plan: ip ending resolution   02/06/2018, 7:42 AM  LOS: 3 days   Consultants:  none  Procedures:  none  Antimicrobials:  none  Interval history/Subjective:  Awake alert oriented no distress Cognizant coherent  Objective: Vitals:  Vitals:   02/06/18 0400 02/06/18 0409  BP: 102/71   Pulse: 69   Resp: 11   Temp:  97.7 F (36.5 C)  SpO2: 100%     Exam:  Constitutional:  . Appears calm and comfortable Eyes:  Marland Kitchen Arcus senilis no pallor . Normal lids and conjunctivae ENMT:  . grossly normal hearing  . Lips appear normal . external ears, nose appear normal . Oropharynx: mucosa, tongue,posterior pharynx appear normal-Mallampati 2 Neck:  . neck appears normal, no masses, normal ROM, supple . no thyromegaly Respiratory:  . CTA bilaterally, no w/r/r.  . Respiratory effort normal. No retractions or accessory muscle use Cardiovascular:  . RRR, no m/r/g . 1 lower extremity edema . Normal pedal pulses Abdomen:  . Abdomen appears normal; no tenderness or masses . No hernias . No HSM Musculoskeletal:  . Digits/nails BUE: no clubbing, cyanosis, petechiae, infection . exam of joints, bones, muscles of at least one of following: Power in upper and lower extremities bilaterally equal o strength and tone normal, no atrophy, no abnormal movements o No tenderness, masses o Normal ROM, no contractures  . gait and station Skin:  . No rashes, lesions, ulcers . palpation of skin: no induration or nodules Neurologic:  . CN 2-12 intact . Sensation all 4 extremities intact Psychiatric:  . Mental status o Mood, affect appropriate o Orientation to person, place, time  . judgement and  insight appear normal    I have personally reviewed the following:   Labs:  At magnesium 1.6, BUN/creatinine 62/8.7 sodium 125  Hemoglobin 8.7  platelet 144  Imaging studies:  X-ray from 4/25  Medical tests:  None  Test discussed with performing physician:  None  Decision to obtain old records:  None  Review and summation of old records:  Reviewed in detail  Scheduled Meds: . allopurinol  300 mg Oral Daily  . amiodarone  200 mg Oral Daily  . Chlorhexidine Gluconate Cloth  6 each Topical Q0600  . darbepoetin (ARANESP) injection - DIALYSIS  200 mcg Intravenous Q Thu-HD  . diclofenac sodium  2 g Topical TID  . feeding supplement (PRO-STAT SUGAR FREE 64)  30 mL Oral BID  . heparin  5,000 Units Subcutaneous Q8H  . mouth rinse  15 mL Mouth Rinse BID  . multivitamin  1 tablet Oral QHS  . mupirocin ointment  1 application Nasal BID  . oxybutynin  5 mg Oral BID  . pantoprazole  40 mg Oral QHS  . sevelamer carbonate  1,600 mg Oral TID WC   Continuous Infusions: . sodium chloride    . sodium chloride    . dextrose 10 mL/hr at 02/06/18 0600    Principal Problem:   Hypoglycemia Active Problems:   Anemia associated with chronic renal failure   Chronic diastolic congestive heart failure ([name])   Diabetes mellitus type 2 in nonobese [facility])   Essential hypertension   Hypoglycemia secondary to sulfonylurea   LOS: 3 days

## 2018-02-07 LAB — GLUCOSE, CAPILLARY
GLUCOSE-CAPILLARY: 100 mg/dL — AB (ref 65–99)
GLUCOSE-CAPILLARY: 80 mg/dL (ref 65–99)
Glucose-Capillary: 80 mg/dL (ref 65–99)
Glucose-Capillary: 86 mg/dL (ref 65–99)

## 2018-02-07 MED ORDER — MAGNESIUM OXIDE 400 MG PO TABS: 800.0000 mg | ORAL_TABLET | Freq: Every day | ORAL | 0 refills | Status: DC

## 2018-02-07 MED ORDER — GLYBURIDE 2.5 MG PO TABS: 2.5000 mg | ORAL_TABLET | Freq: Every day | ORAL | 0 refills | Status: DC

## 2018-02-07 NOTE — Discharge Summary (Signed)
Physician Discharge Summary  Albert Stanley HWE:[id number] DOB: 1945/11/13 DOA: 02/03/2018  PCP: [name], MD  Admit date: 02/03/2018 Discharge date: 02/07/2018  Time spent: 45 minutes  Recommendations for Outpatient Follow-up:  1. Patient should not use glipizide instead should use a very low dose of glyburide and that to only if consecutive blood glucoses are above 180-I will cc patient's primary care physician to ensure that he is aware of the same 2. Follow-up with Dr. Ricki Rodriguez for options regarding left knee pain as well as difficulty ambulating-he has not walked in over a year and a half and is wheelchair-bound at baseline 3.  patient was offered skilled level care but declined to go as felt he would do better at home with home and family 4. Needs LFt and TSH 1 mo as on Amiodarone  Discharge Diagnoses:  Principal Problem:   Hypoglycemia Active Problems:   Anemia associated with chronic renal failure   Chronic diastolic congestive heart failure ([place])   Diabetes mellitus type 2 in nonobese [facility])   Essential hypertension   Hypoglycemia secondary to sulfonylurea   Discharge Condition: improved  Diet recommendation: diabetic renal fluid restrict  Filed Weights   02/06/18 0500 02/06/18 2021 02/07/18 0400  Weight: 99.5 kg (219 lb 5.7 oz) 99.5 kg (219 lb 5.8 oz) 99.5 kg (219 lb 5.7 oz)    History of present illness:  54 male ESRD TTS [facility] Since 05/9380 complicated by chronic hypotension on Midodrine--usual dry weight is 185 to 188 pounds,  DM TY 2,  admission 11/2017 SDH + R femoral DVT (IVC filter placed),  Chronic urinary retention status post trauma, known to have also PVC burden heart failure team saw the patient and admission prior to [place] 3/18, urinary retention with traumatic Foley in the past Other past medical history significant for renal mass/atypical cyst and bladder cancer status post resection 1999 Admit 02/03/2018 at nursing facility was found to have CBG  20 subsequent glucose 16 after Rx-refractory hypoglycemia thought to be secondary to persistence of the same and ESRD and placed on D10 infusion Nephrology consulted regarding ESRD   Hospital Course:  Refractory hypoglycemia on admission-CBG's 80n-120's discontinue D10 and monitor on renal diet-is appropriate at this time possibly for transfer back to skilled facility if can maintain sugars above 110- do not think any surreptitious causes versus more exotic causes are at play Start Glyburide only if sugars are consistently above 180 with repeat checks  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 As OP  Diastolic heart failure chronic-history of PVC burden-previously on amiodarone 200 daily Monitor on telemetry-replace magnesium as above-check LFT/TSH in 1 month  Recent admission traumatic SDH status post craniectomy-? candidate for anticoagulation-- poor candidate for this  Prior DVT 2003, DVT on admit 12/05/17--now has IVC  Bladder mass, chr foley-cont finasteride  Hypomagnesemia-no labs since last dialysis, replace with mag ox 800 daily on d/c and recheck with dialysis  Chronic left knee pain followed by orthopedics Dr. Jerrye Beavers has been seen in the past and been told with his subdural bleed as well as his dialysis he is not a candidate for knee replacement and as a result has not walked in the past 1 year and a half and is wheelchair-bound with transfers-I have offered skilled nursing placement to help rehabilitate him but the family declines this option and  prefers to go home with the help that they have-I have encouraged him and his wife to follow-up with orthopedics to discuss further options if there are any    Consultations:  Renal  Discharge  Exam: Vitals:   02/07/18 0428 02/07/18 0801  BP: 107/67 126/76  Pulse: 67 75  Resp: 17 18  Temp:  (!) 97.3 F (36.3 C)  SpO2: 100% 95%    General: Awake alert no distress Cardiovascular: S1-S2 no murmur rub or gallop Respiratory: Clinically clear no added sound Abdomen soft nontender nondistended no rebound or guarding Left knee seems swollen but has passive and active range of motion Arcus senilis is noted S1-S2 seems to be in sinus rhythm  Discharge Instructions   Discharge Instructions    Diet - low sodium heart healthy   Complete by:  As directed    Discharge instructions   Complete by:  As directed    Do not take blood sugar medicine unless your blood sugar >180 on 2 consecutive occasions.  Please follow with Dr. Delfina Redwood as he will need to adjust your blood sugar meds  We will resume home health for you.   Increase activity slowly   Complete by:  As directed      Allergies as of 02/07/2018      Reactions   Meperidine Shortness Of Breath, Other (See Comments)   Increased heart rate Reaction to demerol   Penicillins Other (See Comments)   "Knocks me out."   PATIENT HAS HAD A PCN REACTION WITH IMMEDIATE RASH, FACIAL/TONGUE/THROAT SWELLING, SOB, OR LIGHTHEADEDNESS WITH HYPOTENSION:  # # #  YES  >> SYNCOPE ### Has patient had a PCN reaction causing severe rash involving mucus membranes or skin necrosis: unknown PATIENT HAS HAD A PCN REACTION THAT REQUIRED TREATMENT AT MD'S OFFICE Has patient had a PCN reaction occurring within the last 10 years: no   Sulfa Antibiotics Shortness Of Breath, Other (See Comments)   Increased heart rate   Sulfonamide Derivatives Shortness Of Breath, Other (See Comments)   TACHYCARDIA   Lac Bovis Diarrhea, Nausea And Vomiting, Other (See Comments)   Constipation Reaction to whole milk   Lactose Intolerance (gi) Diarrhea, Nausea And Vomiting, Other (See Comments)   Constipation Reaction to whole milk   Milk-related Compounds Diarrhea,  Nausea And Vomiting, Other (See Comments)   Constipation Reaction to whole milk   Foltx [elfolate Plus]    UNSPECIFIED REACTION  "Pt does not recall reaction to this"   Betadine [povidone Iodine] Itching, Rash, Other (See Comments)   burning   Eggs Or Egg-derived Products Nausea And Vomiting   Reaction to egg yolks - not whites      Medication List    STOP taking these medications   acetaminophen 500 MG tablet Commonly known as:  TYLENOL   bisacodyl 5 MG EC tablet Commonly known as:  DULCOLAX   glipiZIDE 2.5 MG 24 hr tablet Commonly known as:  GLUCOTROL XL     TAKE these medications   allopurinol 300 MG tablet Commonly known as:  ZYLOPRIM Take 300 mg by mouth daily.   amiodarone 200 MG tablet Commonly known as:  PACERONE Take 1 tablet (200 mg total) by mouth daily.   bacitracin ointment Apply 1 application topically 2 (two) times daily.   diclofenac sodium 1 % Gel Commonly known as:  VOLTAREN Apply 2 g topically 3 (three) times daily. Apply to lower back, left hip and knee   diphenhydrAMINE 25 MG tablet Commonly known as:  BENADRYL Take 25 mg by mouth every 6 (six) hours as needed for allergies.   feeding supplement (PRO-STAT SUGAR FREE 64) Liqd Take 30 mLs by mouth 2 (two) times daily.   glyBURIDE 2.5 MG tablet Commonly known as:  DIABETA Take 1 tablet (2.5 mg total) by mouth daily with breakfast.   lactulose 10 GM/15ML solution Commonly known as:  CHRONULAC Take 10 g by mouth daily as needed for mild constipation.   liver oil-zinc oxide 40 % ointment Commonly known as:  DESITIN Apply 1 application topically daily as needed for irritation.   magnesium oxide 400 MG tablet Commonly known as:  MAG-OX Take 2 tablets (800 mg total) by mouth daily.   Melatonin 3 MG Tabs Take 6 mg by mouth at bedtime.   multivitamin Tabs tablet Take 1 tablet by mouth at bedtime.   neomycin-bacitracin-polymyxin [phone] ointment Apply 1 application topically daily as  needed (area on scalp).   oxybutynin 5 MG tablet Commonly known as:  DITROPAN Take 5 mg by mouth 2 (two) times daily.   oxyCODONE-acetaminophen 7.5-325 MG tablet Commonly known as:  PERCOCET Take 1 tablet by mouth every 6 (six) hours as needed for moderate pain.   pantoprazole 40 MG tablet Commonly known as:  PROTONIX Take 40 mg by mouth at bedtime.   polyethylene glycol packet Commonly known as:  MIRALAX / GLYCOLAX Take 17 g by mouth daily. Hold for loose stool   sevelamer carbonate 800 MG tablet Commonly known as:  RENVELA Take 2 tablets (1,600 mg total) by mouth 3 (three) times daily with meals.      Allergies  Allergen Reactions  . Meperidine Shortness Of Breath and Other (See Comments)    Increased heart rate Reaction to demerol  . Penicillins Other (See Comments)    "Knocks me out."   PATIENT HAS HAD A PCN REACTION WITH IMMEDIATE RASH, FACIAL/TONGUE/THROAT SWELLING, SOB, OR LIGHTHEADEDNESS WITH HYPOTENSION:  # # #  YES  >> SYNCOPE ### Has patient had a PCN reaction causing severe rash involving mucus membranes or skin necrosis: unknown PATIENT HAS HAD A PCN REACTION THAT REQUIRED TREATMENT AT MD'S OFFICE Has patient had a PCN reaction occurring within the last 10 years: no   . Sulfa Antibiotics Shortness Of Breath and Other (See Comments)    Increased heart rate  . Sulfonamide Derivatives Shortness Of Breath and Other (See Comments)    TACHYCARDIA  . Lac Bovis Diarrhea, Nausea And Vomiting and Other (See Comments)    Constipation Reaction to whole milk  . Lactose Intolerance (Gi) Diarrhea, Nausea And Vomiting and Other (See Comments)    Constipation Reaction to whole milk  . Milk-Related Compounds Diarrhea, Nausea And Vomiting and Other (See Comments)    Constipation Reaction to whole milk  . Foltx [Elfolate Plus]     UNSPECIFIED REACTION  "Pt does not recall reaction to this"  . Betadine [Povidone Iodine] Itching, Rash and Other (See Comments)    burning   . Eggs Or Egg-Derived Products Nausea And Vomiting    Reaction to egg yolks - not whites        The results of significant diagnostics from this hospitalization (including imaging, microbiology, ancillary and laboratory) are listed below for reference.    Significant Diagnostic Studies: Ct Head Wo Contrast  Result Date: 01/18/2018 CLINICAL DATA:  Fall striking head on night stand. Laceration head and above right eye. EXAM: CT HEAD WITHOUT CONTRAST CT MAXILLOFACIAL WITHOUT CONTRAST CT CERVICAL SPINE WITHOUT CONTRAST TECHNIQUE: Multidetector CT  imaging of the head, cervical spine, and maxillofacial structures were performed using the standard protocol without intravenous contrast. Multiplanar CT image reconstructions of the cervical spine and maxillofacial structures were also generated. COMPARISON:  Head CT 06/04/2018. Head, face, and cervical spine CT 07/03/2017 FINDINGS: CT HEAD FINDINGS Brain: Interval evacuation of right subdural hematoma. Minimal residual subdural collection measures 3 mm. No evidence of acute hemorrhagic component. Resolution of previous midline shift. No subarachnoid, intraparenchymal, or epidural hematoma. No evidence of acute ischemia. No hydrocephalus. Generalized atrophy and mild chronic small vessel ischemia. Vascular: Atherosclerosis of skullbase vasculature without hyperdense vessel or abnormal calcification. Skull: Right great toe on a me.  No skull fracture. Other: Laceration to the midline frontal region with linear air in the scalp soft tissues. CT MAXILLOFACIAL FINDINGS Osseous: No acute fracture of the nasal bone, zygomatic arches or mandibles. Anterior translation of the left mandible at the temporomandibular joint, unchanged from prior exam. Possible remote left zygomatic fracture. Poor dentition with multiple missing teeth, dental caries, and periapical lucencies. Orbits: Both orbits and globes are intact.  No orbital fracture. Sinuses: No sinus fracture or fluid  level. Mucosal thickening in the maxillary sinuses, left greater than right. Soft tissues: Dressing overlies the right supraorbital soft tissues with small soft tissue hematoma. CT CERVICAL SPINE FINDINGS Alignment: Degenerative anterolisthesis of C3 on C4 is slightly progressed from prior exam. No traumatic subluxation. Skull base and vertebrae: No acute fracture. Vertebral body heights are maintained. The dens and skull base are intact. Chronic endplate and facet changes at multiple levels. Soft tissues and spinal canal: No prevertebral fluid or swelling. No visible canal hematoma. Disc levels: Diffuse disc space narrowing and endplate spurring. Multilevel facet arthropathy. Upper chest: No acute finding. Other: Carotid calcifications. Left internal jugular dialysis catheter is partially included. IMPRESSION: 1. Vertex scalp laceration. No skull fracture or acute intracranial abnormality. 2. Interval evacuation of right subdural hematoma with minimal residual subdural fluid measuring less than 3 mm, likely postsurgical. 3. No acute facial bone fracture. 4. Multilevel degenerative change throughout the cervical spine without acute fracture or subluxation. Electronically Signed   By: Jeb Levering M.D.   On: 01/18/2018 05:25   Ct Cervical Spine Wo Contrast  Result Date: 01/18/2018 CLINICAL DATA:  Fall striking head on night stand. Laceration head and above right eye. EXAM: CT HEAD WITHOUT CONTRAST CT MAXILLOFACIAL WITHOUT CONTRAST CT CERVICAL SPINE WITHOUT CONTRAST TECHNIQUE: Multidetector CT imaging of the head, cervical spine, and maxillofacial structures were performed using the standard protocol without intravenous contrast. Multiplanar CT image reconstructions of the cervical spine and maxillofacial structures were also generated. COMPARISON:  Head CT 06/04/2018. Head, face, and cervical spine CT 07/03/2017 FINDINGS: CT HEAD FINDINGS Brain: Interval evacuation of right subdural hematoma. Minimal residual  subdural collection measures 3 mm. No evidence of acute hemorrhagic component. Resolution of previous midline shift. No subarachnoid, intraparenchymal, or epidural hematoma. No evidence of acute ischemia. No hydrocephalus. Generalized atrophy and mild chronic small vessel ischemia. Vascular: Atherosclerosis of skullbase vasculature without hyperdense vessel or abnormal calcification. Skull: Right great toe on a me.  No skull fracture. Other: Laceration to the midline frontal region with linear air in the scalp soft tissues. CT MAXILLOFACIAL FINDINGS Osseous: No acute fracture of the nasal bone, zygomatic arches or mandibles. Anterior translation of the left mandible at the temporomandibular joint, unchanged from prior exam. Possible remote left zygomatic fracture. Poor dentition with multiple missing teeth, dental caries, and periapical lucencies. Orbits: Both orbits and globes are intact.  No orbital fracture. Sinuses: No sinus fracture or fluid level. Mucosal thickening in the maxillary sinuses, left greater than right. Soft tissues: Dressing overlies the right supraorbital soft tissues with small soft tissue hematoma. CT CERVICAL SPINE FINDINGS Alignment: Degenerative anterolisthesis of C3 on C4 is slightly progressed from prior exam. No traumatic subluxation. Skull base and vertebrae: No acute fracture. Vertebral body heights are maintained. The dens and skull base are intact. Chronic endplate and facet changes at multiple levels. Soft tissues and spinal canal: No prevertebral fluid or swelling. No visible canal hematoma. Disc levels: Diffuse disc space narrowing and endplate spurring. Multilevel facet arthropathy. Upper chest: No acute finding. Other: Carotid calcifications. Left internal jugular dialysis catheter is partially included. IMPRESSION: 1. Vertex scalp laceration. No skull fracture or acute intracranial abnormality. 2. Interval evacuation of right subdural hematoma with minimal residual subdural  fluid measuring less than 3 mm, likely postsurgical. 3. No acute facial bone fracture. 4. Multilevel degenerative change throughout the cervical spine without acute fracture or subluxation. Electronically Signed   By: Jeb Levering M.D.   On: 01/18/2018 05:25   Dg Chest Port 1 View  Result Date: 02/03/2018 CLINICAL DATA:  Central line placement EXAM: PORTABLE CHEST 1 VIEW COMPARISON:  02/03/2018, 12/24/2017 FINDINGS: Left-sided central venous catheter tip over the right atrium. Right-sided central venous catheter tip over the cavoatrial junction. Cardiomegaly with vascular congestion. No pleural effusion or focal opacity. No pneumothorax. Aortic atherosclerosis. IMPRESSION: 1. Right-sided central venous catheter tip overlies the cavoatrial region. No pneumothorax 2. Cardiomegaly with mild vascular congestion Electronically Signed   By: Donavan Foil M.D.   On: 02/03/2018 23:57   Dg Chest Port 1 View  Result Date: 02/03/2018 CLINICAL DATA:  Weakness EXAM: PORTABLE CHEST 1 VIEW COMPARISON:  12/24/2017 FINDINGS: Cardiac shadow is enlarged. Dialysis catheter is again seen. The lungs are well aerated bilaterally without focal infiltrate or sizable effusion. No acute bony abnormality is noted. IMPRESSION: No active disease. Electronically Signed   By: Inez Catalina M.D.   On: 02/03/2018 10:50   Ct Maxillofacial Wo Contrast  Result Date: 01/18/2018 CLINICAL DATA:  Fall striking head on night stand. Laceration head and above right eye. EXAM: CT HEAD WITHOUT CONTRAST CT MAXILLOFACIAL WITHOUT CONTRAST CT CERVICAL SPINE WITHOUT CONTRAST TECHNIQUE: Multidetector CT imaging of the head, cervical spine, and maxillofacial structures were performed using the standard protocol without intravenous contrast. Multiplanar CT image reconstructions of the cervical spine and maxillofacial structures were also generated. COMPARISON:  Head CT 06/04/2018. Head, face, and cervical spine CT 07/03/2017 FINDINGS: CT HEAD FINDINGS  Brain: Interval evacuation of right subdural hematoma. Minimal residual subdural collection measures 3 mm. No evidence of acute hemorrhagic component. Resolution of previous midline shift. No subarachnoid, intraparenchymal, or epidural hematoma. No evidence of acute ischemia. No hydrocephalus. Generalized atrophy and mild chronic small vessel ischemia. Vascular: Atherosclerosis of skullbase vasculature without hyperdense vessel or abnormal calcification. Skull: Right great toe on a me.  No skull fracture. Other: Laceration to the midline frontal region with linear air in the scalp soft tissues. CT MAXILLOFACIAL FINDINGS Osseous: No acute fracture of the nasal bone, zygomatic arches or mandibles. Anterior translation of the left mandible at the temporomandibular joint, unchanged from prior exam. Possible remote left zygomatic fracture. Poor dentition with multiple missing teeth, dental caries, and periapical lucencies. Orbits: Both orbits and globes are intact.  No orbital fracture. Sinuses: No sinus fracture or fluid level. Mucosal thickening in the maxillary sinuses, left greater than right. Soft tissues: Dressing overlies  the right supraorbital soft tissues with small soft tissue hematoma. CT CERVICAL SPINE FINDINGS Alignment: Degenerative anterolisthesis of C3 on C4 is slightly progressed from prior exam. No traumatic subluxation. Skull base and vertebrae: No acute fracture. Vertebral body heights are maintained. The dens and skull base are intact. Chronic endplate and facet changes at multiple levels. Soft tissues and spinal canal: No prevertebral fluid or swelling. No visible canal hematoma. Disc levels: Diffuse disc space narrowing and endplate spurring. Multilevel facet arthropathy. Upper chest: No acute finding. Other: Carotid calcifications. Left internal jugular dialysis catheter is partially included. IMPRESSION: 1. Vertex scalp laceration. No skull fracture or acute intracranial abnormality. 2. Interval  evacuation of right subdural hematoma with minimal residual subdural fluid measuring less than 3 mm, likely postsurgical. 3. No acute facial bone fracture. 4. Multilevel degenerative change throughout the cervical spine without acute fracture or subluxation. Electronically Signed   By: Jeb Levering M.D.   On: 01/18/2018 05:25    Microbiology: Recent Results (from the past 240 hour(s))  MRSA PCR Screening     Status: Abnormal   Collection Time: 02/04/18  3:33 AM  Result Value Ref Range Status   MRSA by PCR POSITIVE (A) NEGATIVE Final    Comment:        The GeneXpert MRSA Assay (FDA approved for NASAL specimens only), is one component of a comprehensive MRSA colonization surveillance program. It is not intended to diagnose MRSA infection nor to guide or monitor treatment for MRSA infections. RESULT CALLED TO, READ BACK BY AND VERIFIED WITHEverlean Alstrom RN [phone] 02/04/18 A BROWNING Performed at [facility], [place] [address] [place], [place] [address]      Labs: Basic Metabolic Panel: Recent Labs  Lab 02/03/18 1022 02/03/18 2051 02/04/18 0114 02/04/18 0345 02/04/18 1414 02/06/18 0703  NA 137 136 135 133*  --  125*  K 3.5 3.3* 3.5 3.5  --  4.1  CL 100* 101 101 99*  --  90*  CO2 16* 16*  --  17*  --  19*  GLUCOSE 31* 82 38* 53*  --  118*  BUN 72* 73* 63* 74*  --  62*  CREATININE 10.69* 10.60* 10.90* 10.84*  --  8.74*  CALCIUM 7.8* 7.5*  --  7.4*  --  7.5*  MG  --   --   --   --  1.5* 1.6*  PHOS  --   --   --  6.7*  --   --    Liver Function Tests: Recent Labs  Lab 02/04/18 0345 02/06/18 0703  AST 16 13*  ALT 13* 13*  ALKPHOS 69 64  BILITOT 0.8 0.8  PROT 5.7* 5.7*  ALBUMIN 2.7* 2.6*   No results for input(s): LIPASE, AMYLASE in the last 168 hours. No results for input(s): AMMONIA in the last 168 hours. CBC: Recent Labs  Lab 02/03/18 1022 02/04/18 0114 02/04/18 0345 02/06/18 0703  WBC 5.7  --  5.3 6.6  NEUTROABS 4.0  --   --   --   HGB 8.5* 9.9* 8.2*  8.7*  HCT 25.1* 29.0* 24.3* 25.5*  MCV 89.0  --  87.4 87.9  PLT 135*  --  146* 144*   Cardiac Enzymes: No results for input(s): CKTOTAL, CKMB, CKMBINDEX, TROPONINI in the last 168 hours. BNP: BNP (last 3 results) No results for input(s): BNP in the last 8760 hours.  ProBNP (last 3 results) No results for input(s): PROBNP in the last 8760 hours.  CBG: Recent  Labs  Lab 02/06/18 1546 02/06/18 2020 02/07/18 0005 02/07/18 0428 02/07/18 0759  GLUCAP 110* 93 100* 80 80       Signed:  Nita Sells MD   [facility] 02/07/2018, 9:45 AM

## 2018-02-07 NOTE — Progress Notes (Signed)
Physical Therapy Treatment Patient Details Name: Albert Stanley MRN: [id number] DOB: [birth year] Today's Date: 02/07/2018    History of Present Illness Pt admitted 02/03/18 with persistent hypoglycemia. 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    PT Comments    Continuing work on functional mobility and activity tolerance;  Still weak and with difficulty with transfers involving upright standing ( sit<>stand, stand pivot); Showing very good progress with lateral scoot-sliding board transfers with the practice this session; for getting into the car, if he stand pivots will need heavy +2 assist; left long sliding board in the room in case nursing would rather sliding board into the car  Follow Up Recommendations  Home health PT;Supervision/Assistance - 24 hour     Equipment Recommendations  None recommended by PT    Recommendations for Other Services       Precautions / Restrictions Precautions Precautions: Fall Precaution Comments: urostomy    Mobility  Bed Mobility                  Transfers Overall transfer level: Needs assistance Equipment used: Rolling walker (2 wheeled) Transfers: Sit to/from Stand;Lateral/Scoot Transfers Sit to Stand: Mod assist        Lateral/Scoot Transfers: Mod assist;Min assist;Min guard General transfer comment: Cues for prepositioning, hand position and safety; heavy mod assist of 1 for straight sit to stand; Worked on lateral scoot transfers chair to bed, and then practiced scooting between multiple times; initially needing mod assist for salteral scoot, and noted tending to scoot hips forward, and not sideways; Added sliding board, and that gave him a path for his hips; much better with sliding board -- min assist progressing to minguard assist with multiple scoots back and forth bed <>chair; at one point he indicated he learned how to use the  sliding board at rehab; he still needs cues for hand placement and to avoid putting fingers under board  Ambulation/Gait                 Stairs             Wheelchair Mobility    Modified Rankin (Stroke Patients Only)       Balance     Sitting balance-Leahy Scale: Fair     Standing balance support: Bilateral upper extremity supported;During functional activity Standing balance-Leahy Scale: Poor                              Cognition Arousal/Alertness: Awake/alert Behavior During Therapy: WFL for tasks assessed/performed Overall Cognitive Status: Within Functional Limits for tasks assessed                                 General Comments: likely at baseline cognition      Exercises      General Comments General comments (skin integrity, edema, etc.): Reported some dizziness during session; BP WNL      Pertinent Vitals/Pain Pain Assessment: Faces Faces Pain Scale: Hurts little more Pain Location: L knee with weight bearing Pain Descriptors / Indicators: Aching;Grimacing;Guarding Pain Intervention(s): Monitored during session;Repositioned    Home Living                      Prior Function  PT Goals (current goals can now be found in the care plan section) Acute Rehab PT Goals Patient Stated Goal: to go home PT Goal Formulation: With patient Time For Goal Achievement: 02/20/18 Potential to Achieve Goals: Good Progress towards PT goals: Progressing toward goals    Frequency    Min 3X/week      PT Plan Current plan remains appropriate    Co-evaluation              AM-PAC PT "6 Clicks" Daily Activity  Outcome Measure  Difficulty turning over in bed (including adjusting bedclothes, sheets and blankets)?: A Lot Difficulty moving from lying on back to sitting on the side of the bed? : Unable Difficulty sitting down on and standing up from a chair with arms (e.g., wheelchair, bedside  commode, etc,.)?: Unable Help needed moving to and from a bed to chair (including a wheelchair)?: A Lot Help needed walking in hospital room?: Total Help needed climbing 3-5 steps with a railing? : Total 6 Click Score: 8    End of Session Equipment Utilized During Treatment: Gait belt(sliding board) Activity Tolerance: Patient tolerated treatment well Patient left: in chair;with call bell/phone within reach Nurse Communication: Mobility status;Other (comment)(2 person assist to stand pivot; sliding board an option also) PT Visit Diagnosis: Other abnormalities of gait and mobility (R26.89);Muscle weakness (generalized) (M62.81)     Time: 5176-1607 PT Time Calculation (min) (ACUTE ONLY): 46 min  Charges:  $Therapeutic Activity: 38-52 mins                    G Codes:       Roney Marion, PT  Acute Rehabilitation Services Pager [phone] Office [phone]    Colletta [place] 02/07/2018, 11:14 AM

## 2018-02-07 NOTE — Consult Note (Signed)
   [facility] Inpatient Consult   02/07/2018  Albert Stanley 1946/05/03 [id number]  Patient screened for potential [facility] services. Patient is in the [facility] the [facility] services under patient's [employer] plan. Met with patient and wife.  Patient is actively being discharged. Wife Bertram Millard states, she is planning to check into [facility].  She currently has [facility] and  Private pay caregivers with [employer].  She states, "I still work full time.  This is going to be much more than I anticipated.  Accepted a brochure, 24 hour nurse advise line with contact information.  She states she appreciates the information but feels she will folow up on [facility].  She already has transportation with [employer]. Encouraged her to self refer if needed. She verbalized understanding.   For questions contact:   Natividad Brood, RN BSN [facility] Liaison  [phone] business mobile phone Toll free office [phone]

## 2018-02-07 NOTE — Progress Notes (Addendum)
[facility] Progress Note   Subjective:  Sitting in bedside recliner Still feels weak  CBG 80s-100s  Objective Vitals:   02/06/18 2021 02/07/18 0400 02/07/18 0428 02/07/18 0801  BP: 123/75  107/67 126/76  Pulse: 79  67 75  Resp: 20  17 18   Temp: 97.7 F (36.5 C)   (!) 97.3 F (36.3 C)  TempSrc: Oral   Oral  SpO2:   100% 95%  Weight: 99.5 kg (219 lb 5.8 oz) 99.5 kg (219 lb 5.7 oz)    Height:       Physical Exam General: Overweight elderly male; +facial edema NAD Heart: RRR Lungs: CTAB  Abdomen: soft NT Extremities: LUA edema; LE bilat pitting edema - improving per patient  Dialysis Access: L IJ TDC dsg intact; LUE AVG +bruit   Dialysis Orders:  [facility] TTS 4h 400/800 88.5kg 2K/2Ca Profile 4 LUE AVG placed 12/15/17 (should be ok to use) L IJ TDC  No Heparin   Mircera 24mcg IV q 2 weeks (last 4/11)  Calcitriol 3.5mcg PO TIW   Assessment/Plan: 1. Hypoglycemia - Refractory on admission. Now improved. Glipizide d/c'd - per primary  2. ESRD - TTS. Next HD 4/30  3. Anemia -  Aranesp 200 dosed 4/26  4. MBD- Cont. Calcitirol/Renvela/Sensipar  5. Hypotension/volume - Chronic edema with UF limited by hypotension. On midodrine 10mg   for BP support  6. Nutrition - Renal diet/vitamins/prot supp for low albumin  7 Hx SDH s/p craniotomy 8. Hx DVT s/p IVC filter 9 NICM/CHF  10 Debility - recent SNF admission. Offered SNF placement but apparently declined. For discharge to home   Lynnda Child PA-C [facility] Pager [phone] 02/07/2018,11:43 AM  LOS: 4 days   Pt seen, examined and agree w A/P as above.  Kelly Splinter MD [facility] pager [phone]   02/07/2018, 3:27 PM    Additional Objective Labs: Basic Metabolic Panel: Recent Labs  Lab 02/03/18 2051 02/04/18 0114 02/04/18 0345 02/06/18 0703  NA 136 135 133* 125*  K 3.3* 3.5 3.5 4.1  CL 101 101 99* 90*  CO2 16*  --  17* 19*  GLUCOSE 82 38* 53* 118*  BUN 73*  63* 74* 62*  CREATININE 10.60* 10.90* 10.84* 8.74*  CALCIUM 7.5*  --  7.4* 7.5*  PHOS  --   --  6.7*  --    CBC: Recent Labs  Lab 02/03/18 1022 02/04/18 0114 02/04/18 0345 02/06/18 0703  WBC 5.7  --  5.3 6.6  NEUTROABS 4.0  --   --   --   HGB 8.5* 9.9* 8.2* 8.7*  HCT 25.1* 29.0* 24.3* 25.5*  MCV 89.0  --  87.4 87.9  PLT 135*  --  146* 144*   Blood Culture    Component Value Date/Time   SDES URINE, CATHETERIZED 06/26/2017 1652   SPECREQUEST NONE 06/26/2017 1652   CULT >=100,000 COLONIES/mL SERRATIA MARCESCENS (A) 06/26/2017 1652   REPTSTATUS 06/28/2017 FINAL 06/26/2017 1652    Cardiac Enzymes: No results for input(s): CKTOTAL, CKMB, CKMBINDEX, TROPONINI in the last 168 hours. CBG: Recent Labs  Lab 02/06/18 2020 02/07/18 0005 02/07/18 0428 02/07/18 0759 02/07/18 1127  GLUCAP 93 100* 80 80 86   Iron Studies: No results for input(s): IRON, TIBC, TRANSFERRIN, FERRITIN in the last 72 hours. Lab Results  Component Value Date   INR 1.59 01/18/2018   INR 1.72 12/09/2017   INR 1.69 12/08/2017   Medications: . sodium chloride    . sodium chloride    .  dextrose 10 mL/hr at 02/06/18 1812   . allopurinol  300 mg Oral Daily  . amiodarone  200 mg Oral Daily  . Chlorhexidine Gluconate Cloth  6 each Topical Q0600  . darbepoetin (ARANESP) injection - DIALYSIS  200 mcg Intravenous Q Thu-HD  . diclofenac sodium  2 g Topical TID  . feeding supplement (PRO-STAT SUGAR FREE 64)  30 mL Oral BID  . heparin  5,000 Units Subcutaneous Q8H  . mouth rinse  15 mL Mouth Rinse BID  . multivitamin  1 tablet Oral QHS  . mupirocin ointment  1 application Nasal BID  . oxybutynin  5 mg Oral BID  . pantoprazole  40 mg Oral QHS  . sevelamer carbonate  1,600 mg Oral TID WC

## 2018-02-07 NOTE — Care Management Note (Signed)
Case Management Note Marvetta Gibbons RN, BSN Unit 4E-Case Manager-- coverage for [phone]  Patient Details  Name: Albert Stanley MRN: [id number] Date of Birth: [birth year]  Subjective/Objective:  Pt admitted with hypoglycemia               Action/Plan: PTA pt lived at home with wife- spoke with pt at bedside and wife (via [place]) regarding transition needs to return home- per conversation with Ruby via TC- pt is active with [facility] for [facility] services they would like to resume services with them- Orders have been placed and CM will notify [facility] of discharge- wife reports that pt has all needed DME at home- they also have private duty assistance with [place]- pt uses [facility] however they are unable to transport today under short notice- so wife will plan to transport and states that she has someone available to assist her this afternoon to help her when she gets pt home. Per wife she also plans to call [facility] program to ask for an assessment- discussed program with both wife and pt. Call made to Tiffany with [facility] to notify for resumption of [facility] services and pt. Discharge today.   Expected Discharge Date:  02/07/18               Expected Discharge Plan:  Day  In-House Referral:  NA  Discharge planning Services  CM Consult  Post Acute Care Choice:  Home Health, Resumption of Svcs/PTA Provider Choice offered to:  Patient, Spouse  DME Arranged:  N/A DME Agency:  NA  HH Arranged:  RN, PT, OT, Nurse's Aide, Social Work [employer] Agency:  [facility]  Status of Service:  Completed, signed off  If discussed at [employer], dates discussed:    Discharge Disposition: home/home health   Additional Comments:  Dawayne Patricia, RN 02/07/2018, 12:21 PM

## 2018-02-07 NOTE — Progress Notes (Signed)
Pt. discharged to home. Pt after visit summary reviewed with wife and patient and wife capable of re verbalizing medications and follow up appointments. Pt remains stable. No signs and symptoms of distress. Educated to return to ER in the event of SOB, dizziness, chest pain, or fainting. Mady Gemma, RN

## 2018-02-08 ENCOUNTER — Encounter (HOSPITAL_COMMUNITY): Payer: Self-pay | Admitting: Emergency Medicine

## 2018-02-08 ENCOUNTER — Other Ambulatory Visit: Payer: Self-pay

## 2018-02-08 ENCOUNTER — Observation Stay (HOSPITAL_COMMUNITY)
Admission: EM | Admit: 2018-02-08 | Discharge: 2018-02-11 | Disposition: A | Discharge status: Skilled Nursing Facility | Payer: Medicare Other | Attending: Internal Medicine | Admitting: Internal Medicine

## 2018-02-08 DIAGNOSIS — I428 Other cardiomyopathies: Secondary | ICD-10-CM | POA: Insufficient documentation

## 2018-02-08 DIAGNOSIS — N3289 Other specified disorders of bladder: Secondary | ICD-10-CM | POA: Diagnosis not present

## 2018-02-08 DIAGNOSIS — R5381 Other malaise: Secondary | ICD-10-CM | POA: Insufficient documentation

## 2018-02-08 DIAGNOSIS — L8990 Pressure ulcer of unspecified site, unspecified stage: Secondary | ICD-10-CM | POA: Diagnosis not present

## 2018-02-08 DIAGNOSIS — K0889 Other specified disorders of teeth and supporting structures: Secondary | ICD-10-CM | POA: Diagnosis not present

## 2018-02-08 DIAGNOSIS — E162 Hypoglycemia, unspecified: Secondary | ICD-10-CM | POA: Diagnosis not present

## 2018-02-08 DIAGNOSIS — Z95828 Presence of other vascular implants and grafts: Secondary | ICD-10-CM | POA: Insufficient documentation

## 2018-02-08 DIAGNOSIS — N2581 Secondary hyperparathyroidism of renal origin: Secondary | ICD-10-CM | POA: Diagnosis not present

## 2018-02-08 DIAGNOSIS — M25562 Pain in left knee: Secondary | ICD-10-CM | POA: Diagnosis not present

## 2018-02-08 DIAGNOSIS — Z992 Dependence on renal dialysis: Secondary | ICD-10-CM | POA: Diagnosis not present

## 2018-02-08 DIAGNOSIS — I5022 Chronic systolic (congestive) heart failure: Secondary | ICD-10-CM | POA: Diagnosis not present

## 2018-02-08 DIAGNOSIS — Z7984 Long term (current) use of oral hypoglycemic drugs: Secondary | ICD-10-CM | POA: Insufficient documentation

## 2018-02-08 DIAGNOSIS — R6884 Jaw pain: Secondary | ICD-10-CM | POA: Insufficient documentation

## 2018-02-08 DIAGNOSIS — I132 Hypertensive heart and chronic kidney disease with heart failure and with stage 5 chronic kidney disease, or end stage renal disease: Secondary | ICD-10-CM | POA: Diagnosis not present

## 2018-02-08 DIAGNOSIS — Z79891 Long term (current) use of opiate analgesic: Secondary | ICD-10-CM | POA: Insufficient documentation

## 2018-02-08 DIAGNOSIS — E1142 Type 2 diabetes mellitus with diabetic polyneuropathy: Secondary | ICD-10-CM | POA: Diagnosis present

## 2018-02-08 DIAGNOSIS — Z79899 Other long term (current) drug therapy: Secondary | ICD-10-CM | POA: Insufficient documentation

## 2018-02-08 DIAGNOSIS — Z96642 Presence of left artificial hip joint: Secondary | ICD-10-CM | POA: Insufficient documentation

## 2018-02-08 DIAGNOSIS — Z8551 Personal history of malignant neoplasm of bladder: Secondary | ICD-10-CM | POA: Diagnosis not present

## 2018-02-08 DIAGNOSIS — Z87891 Personal history of nicotine dependence: Secondary | ICD-10-CM | POA: Insufficient documentation

## 2018-02-08 DIAGNOSIS — E871 Hypo-osmolality and hyponatremia: Secondary | ICD-10-CM | POA: Diagnosis not present

## 2018-02-08 DIAGNOSIS — E11649 Type 2 diabetes mellitus with hypoglycemia without coma: Principal | ICD-10-CM | POA: Insufficient documentation

## 2018-02-08 DIAGNOSIS — E8770 Fluid overload, unspecified: Secondary | ICD-10-CM | POA: Diagnosis present

## 2018-02-08 DIAGNOSIS — G8929 Other chronic pain: Secondary | ICD-10-CM | POA: Insufficient documentation

## 2018-02-08 DIAGNOSIS — N186 End stage renal disease: Secondary | ICD-10-CM | POA: Diagnosis not present

## 2018-02-08 DIAGNOSIS — E1122 Type 2 diabetes mellitus with diabetic chronic kidney disease: Secondary | ICD-10-CM | POA: Insufficient documentation

## 2018-02-08 DIAGNOSIS — D631 Anemia in chronic kidney disease: Secondary | ICD-10-CM | POA: Insufficient documentation

## 2018-02-08 DIAGNOSIS — I9589 Other hypotension: Secondary | ICD-10-CM | POA: Diagnosis not present

## 2018-02-08 DIAGNOSIS — Z86718 Personal history of other venous thrombosis and embolism: Secondary | ICD-10-CM | POA: Insufficient documentation

## 2018-02-08 DIAGNOSIS — M109 Gout, unspecified: Secondary | ICD-10-CM | POA: Insufficient documentation

## 2018-02-08 DIAGNOSIS — D696 Thrombocytopenia, unspecified: Secondary | ICD-10-CM | POA: Diagnosis not present

## 2018-02-08 DIAGNOSIS — R531 Weakness: Secondary | ICD-10-CM | POA: Diagnosis not present

## 2018-02-08 LAB — CBC
HEMATOCRIT: 26.6 % — AB (ref 39.0–52.0)
HEMOGLOBIN: 9.4 g/dL — AB (ref 13.0–17.0)
MCH: 30.9 pg (ref 26.0–34.0)
MCHC: 35.3 g/dL (ref 30.0–36.0)
MCV: 87.5 fL (ref 78.0–100.0)
Platelets: 163 10*3/uL (ref 150–400)
RBC: 3.04 MIL/uL — AB (ref 4.22–5.81)
RDW: 17.5 % — ABNORMAL HIGH (ref 11.5–15.5)
WBC: 5.8 10*3/uL (ref 4.0–10.5)

## 2018-02-08 LAB — PHOSPHORUS: Phosphorus: 7.1 mg/dL — ABNORMAL HIGH (ref 2.5–4.6)

## 2018-02-08 LAB — CBG MONITORING, ED
GLUCOSE-CAPILLARY: 87 mg/dL (ref 65–99)
GLUCOSE-CAPILLARY: 57 mg/dL — AB (ref 65–99)
GLUCOSE-CAPILLARY: 217 mg/dL — AB (ref 65–99)
Glucose-Capillary: 39 mg/dL — CL (ref 65–99)
Glucose-Capillary: 70 mg/dL (ref 65–99)
Glucose-Capillary: 83 mg/dL (ref 65–99)
Glucose-Capillary: 68 mg/dL (ref 65–99)
Glucose-Capillary: 56 mg/dL — ABNORMAL LOW (ref 65–99)

## 2018-02-08 LAB — COMPREHENSIVE METABOLIC PANEL
ALBUMIN: 2.8 g/dL — AB (ref 3.5–5.0)
ALK PHOS: 72 U/L (ref 38–126)
ALT: 16 U/L — AB (ref 17–63)
AST: 28 U/L (ref 15–41)
Anion gap: 21 — ABNORMAL HIGH (ref 5–15)
BILIRUBIN TOTAL: 0.9 mg/dL (ref 0.3–1.2)
BUN: 85 mg/dL — AB (ref 6–20)
CALCIUM: 7.8 mg/dL — AB (ref 8.9–10.3)
CO2: 15 mmol/L — ABNORMAL LOW (ref 22–32)
CREATININE: 10.12 mg/dL — AB (ref 0.61–1.24)
Chloride: 86 mmol/L — ABNORMAL LOW (ref 101–111)
GFR calc Af Amer: 5 mL/min — ABNORMAL LOW (ref >60)
GFR calc non Af Amer: 4 mL/min — ABNORMAL LOW (ref >60)
GLUCOSE: 60 mg/dL — AB (ref 65–99)
Potassium: 4.8 mmol/L (ref 3.5–5.1)
SODIUM: 122 mmol/L — AB (ref 135–145)
TOTAL PROTEIN: 6.3 g/dL — AB (ref 6.5–8.1)

## 2018-02-08 LAB — MAGNESIUM: Magnesium: 1.9 mg/dL (ref 1.7–2.4)

## 2018-02-08 LAB — GLUCOSE, CAPILLARY
Glucose-Capillary: 147 mg/dL — ABNORMAL HIGH (ref 65–99)
Glucose-Capillary: 52 mg/dL — ABNORMAL LOW (ref 65–99)

## 2018-02-08 MED ORDER — ZINC OXIDE 40 % EX OINT
1.0000 "application " | TOPICAL_OINTMENT | Freq: Every day | CUTANEOUS | Status: DC | PRN
  Filled 2018-02-08: qty 113

## 2018-02-08 MED ORDER — MAGNESIUM OXIDE 400 (241.3 MG) MG PO TABS
800.0000 mg | ORAL_TABLET | Freq: Every day | ORAL | Status: DC
  Administered 2018-02-09 – 2018-02-11 (×3): 800 mg via ORAL
  Filled 2018-02-08 (×3): qty 2

## 2018-02-08 MED ORDER — MELATONIN 3 MG PO TABS
6.0000 mg | ORAL_TABLET | Freq: Every evening | ORAL | Status: DC | PRN
  Administered 2018-02-10: 6 mg via ORAL
  Filled 2018-02-08 (×2): qty 2

## 2018-02-08 MED ORDER — DEXTROSE 10 % IV SOLN
INTRAVENOUS | Status: DC
  Administered 2018-02-08 – 2018-02-09 (×2): via INTRAVENOUS

## 2018-02-08 MED ORDER — BACITRACIN-NEOMYCIN-POLYMYXIN OINTMENT TUBE
1.0000 "application " | TOPICAL_OINTMENT | Freq: Every day | CUTANEOUS | Status: DC | PRN
  Filled 2018-02-08: qty 14.17

## 2018-02-08 MED ORDER — MIDODRINE HCL 5 MG PO TABS
5.0000 mg | ORAL_TABLET | ORAL | Status: DC
  Administered 2018-02-10 – 2018-02-11 (×2): 5 mg via ORAL
  Filled 2018-02-08: qty 1

## 2018-02-08 MED ORDER — LACTULOSE 10 GM/15ML PO SOLN: 10.0000 g | Freq: Every day | ORAL | Status: DC | PRN

## 2018-02-08 MED ORDER — SODIUM CHLORIDE 0.9 % IV SOLN: 100.0000 mL | INTRAVENOUS | Status: DC | PRN

## 2018-02-08 MED ORDER — AMIODARONE HCL 200 MG PO TABS
200.0000 mg | ORAL_TABLET | Freq: Every day | ORAL | Status: DC
  Administered 2018-02-09 – 2018-02-11 (×3): 200 mg via ORAL
  Filled 2018-02-08 (×3): qty 1

## 2018-02-08 MED ORDER — SEVELAMER CARBONATE 800 MG PO TABS
2400.0000 mg | ORAL_TABLET | Freq: Three times a day (TID) | ORAL | Status: DC
  Administered 2018-02-09 – 2018-02-11 (×6): 2400 mg via ORAL
  Filled 2018-02-08 (×5): qty 3

## 2018-02-08 MED ORDER — RENA-VITE PO TABS
1.0000 | ORAL_TABLET | Freq: Every day | ORAL | Status: DC
  Administered 2018-02-09 – 2018-02-10 (×2): 1 via ORAL
  Filled 2018-02-08 (×2): qty 1

## 2018-02-08 MED ORDER — HEPARIN SODIUM (PORCINE) 5000 UNIT/ML IJ SOLN
5000.0000 [IU] | Freq: Three times a day (TID) | INTRAMUSCULAR | Status: DC
  Administered 2018-02-09 – 2018-02-11 (×7): 5000 [IU] via SUBCUTANEOUS
  Filled 2018-02-08 (×7): qty 1

## 2018-02-08 MED ORDER — DEXTROSE 50 % IV SOLN
INTRAVENOUS | Status: AC
  Administered 2018-02-08: 50 mL via INTRAVENOUS
  Filled 2018-02-08: qty 50

## 2018-02-08 MED ORDER — PRO-STAT SUGAR FREE PO LIQD
30.0000 mL | Freq: Two times a day (BID) | ORAL | Status: DC
  Administered 2018-02-09 – 2018-02-11 (×4): 30 mL via ORAL
  Filled 2018-02-08 (×5): qty 30

## 2018-02-08 MED ORDER — CALCITRIOL 0.25 MCG PO CAPS
3.2500 ug | ORAL_CAPSULE | ORAL | Status: DC
  Administered 2018-02-10: 3.25 ug via ORAL
  Filled 2018-02-08: qty 1

## 2018-02-08 MED ORDER — BACITRACIN ZINC 500 UNIT/GM EX OINT
1.0000 "application " | TOPICAL_OINTMENT | Freq: Two times a day (BID) | CUTANEOUS | Status: DC
  Administered 2018-02-09 – 2018-02-11 (×5): 1 via TOPICAL
  Filled 2018-02-08: qty 28.35

## 2018-02-08 MED ORDER — SEVELAMER CARBONATE 800 MG PO TABS: 1600.0000 mg | ORAL_TABLET | Freq: Three times a day (TID) | ORAL | Status: DC

## 2018-02-08 MED ORDER — PANTOPRAZOLE SODIUM 40 MG PO TBEC
40.0000 mg | DELAYED_RELEASE_TABLET | Freq: Every day | ORAL | Status: DC
  Administered 2018-02-09 – 2018-02-10 (×2): 40 mg via ORAL
  Filled 2018-02-08 (×2): qty 1

## 2018-02-08 MED ORDER — OXYBUTYNIN CHLORIDE 5 MG PO TABS
5.0000 mg | ORAL_TABLET | Freq: Two times a day (BID) | ORAL | Status: DC
  Administered 2018-02-09 – 2018-02-11 (×5): 5 mg via ORAL
  Filled 2018-02-08 (×5): qty 1

## 2018-02-08 MED ORDER — HEPARIN SODIUM (PORCINE) 1000 UNIT/ML DIALYSIS
1000.0000 [IU] | INTRAMUSCULAR | Status: DC | PRN
  Filled 2018-02-08: qty 1

## 2018-02-08 MED ORDER — GLUCAGON HCL RDNA (DIAGNOSTIC) 1 MG IJ SOLR
1.0000 mg | Freq: Once | INTRAMUSCULAR | Status: AC
  Administered 2018-02-08: 1 mg via INTRAVENOUS
  Filled 2018-02-08: qty 1

## 2018-02-08 MED ORDER — ALLOPURINOL 300 MG PO TABS
300.0000 mg | ORAL_TABLET | Freq: Every day | ORAL | Status: DC
  Administered 2018-02-09 – 2018-02-11 (×3): 300 mg via ORAL
  Filled 2018-02-08 (×3): qty 1

## 2018-02-08 MED ORDER — DEXTROSE 50 % IV SOLN
INTRAVENOUS | Status: AC
  Administered 2018-02-08: 50 mL
  Filled 2018-02-08: qty 50

## 2018-02-08 MED ORDER — DICLOFENAC SODIUM 1 % TD GEL
2.0000 g | Freq: Three times a day (TID) | TRANSDERMAL | Status: DC
  Administered 2018-02-09 – 2018-02-11 (×5): 2 g via TOPICAL
  Filled 2018-02-08: qty 100

## 2018-02-08 MED ORDER — OXYCODONE-ACETAMINOPHEN 7.5-325 MG PO TABS
1.0000 | ORAL_TABLET | Freq: Four times a day (QID) | ORAL | Status: DC | PRN
  Administered 2018-02-09 – 2018-02-11 (×8): 1 via ORAL
  Filled 2018-02-08 (×8): qty 1

## 2018-02-08 MED ORDER — DIPHENHYDRAMINE HCL 25 MG PO CAPS
25.0000 mg | ORAL_CAPSULE | Freq: Four times a day (QID) | ORAL | Status: DC | PRN
  Administered 2018-02-09 – 2018-02-11 (×3): 25 mg via ORAL
  Filled 2018-02-08 (×2): qty 1

## 2018-02-08 MED ORDER — ACETAMINOPHEN 500 MG PO TABS
500.0000 mg | ORAL_TABLET | Freq: Four times a day (QID) | ORAL | Status: DC | PRN
  Administered 2018-02-11: 500 mg via ORAL
  Filled 2018-02-08 (×2): qty 1

## 2018-02-08 MED ORDER — DEXTROSE 10 % IV SOLN
INTRAVENOUS | Status: DC
  Administered 2018-02-09: 16:00:00 via INTRAVENOUS

## 2018-02-08 NOTE — Progress Notes (Signed)
Hypoglycemic Event  CBG: 52  Treatment: D50 IV 25 mL  Symptoms: Pale  Follow-up CBG: Time: 1825 CBG Result: 147  Possible Reasons for Event: Unknown  Comments/MD notified    Albert Stanley Albert Stanley

## 2018-02-08 NOTE — H&P (Signed)
History and Physical    Albert Stanley YOV:[id number] DOB: 1946/08/28 DOA: 02/08/2018  Referring MD/NP/PA: Joline Maxcy, PA  PCP: [name], MD   Outpatient Specialists: Dr [name], Nephrology   Patient coming from: Home  Chief Complaint: Confusion  HPI: Albert Stanley is a 72 y.o. 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 He was just discharged yesterday after admission with refractory hypoglycemia. Patient came in today and was found to to have CBG of 39. During his hospitalization he was stable and placed back on his glyburide at discharged. Patient took his pill while at home. Patient has so far received glucose in the ER and is recovering and fully awake. Nephrology has been consulted so the patient could have hemodialysis today.  ED Course: patient was given D10 after a bolus of D50. Blood sugar has now come to 140s to 150s. Nephrology has been consulted and patient is to have hemodialysis  Today.  Review of Systems: As per HPI otherwise 10 point review of systems negative.     Past Medical History:  Diagnosis Date  . Arthritis    Osteoarthritis  . Asthma   . Bladder cancer ([place]) dx'd 1990   surg only  . CHF (congestive heart failure) ([place])   . Diabetes mellitus without complication ([place])   . ESRD (end stage renal disease) ([name])    Tues, Thurs, Sat dialysis  . GERD (gastroesophageal reflux disease)   . History of cardiac catheterization    a. LHC 5/17: no obstructive CAD  . History of DVT (deep vein thrombosis)    2003 ish  . History of nuclear stress test    a. Myoview 4/17: EF 39%, inf, inf-lat, apical inf, apical lat, apical scar, intermediate risk  . Hyperlipidemia   . Hypertension   . NICM (nonischemic cardiomyopathy) ([place])    a. Echo 2/17: mod LVH, EF 35-40%, inf-lat and inf-sept HK, mod MR, severe LAE, small  pericardial effusion    Past Surgical History:  Procedure Laterality Date  . arthroscopic knee surgery Left   . AV FISTULA PLACEMENT Left 12/13/2015   Procedure: ARTERIOVENOUS (AV) FISTULA CREATION;  Surgeon: Angelia Mould, MD;  Location: [facility] OR;  Service: Vascular;  Laterality: Left;  . AV FISTULA PLACEMENT Left 12/15/2017   Procedure: INSERTION OF ARTERIOVENOUS (AV) GORE-TEX GRAFT ARM;  Surgeon: Serafina Mitchell, MD;  Location: [place];  Service: Vascular;  Laterality: Left;  . BACK SURGERY     2 times  1960  . CARDIAC CATHETERIZATION N/A 02/28/2016   Procedure: Left Heart Cath and Coronary Angiography;  Surgeon: Larey Dresser, MD;  Location: [place] [facility];  Service: Cardiovascular;  Laterality: N/A;  . CRANIOTOMY N/A 12/05/2017   Procedure: CRANIOTOMY HEMATOMA EVACUATION SUBDURAL;  Surgeon: Ashok Pall, MD;  Location: [facility];  Service: Neurosurgery;  Laterality: N/A;  . CYSTOSCOPY    . FISTULA SUPERFICIALIZATION Left 08/14/2016   Procedure: FISTULA SUPERFICIALIZATION LEFT UPPER ARM;  Surgeon: Angelia Mould, MD;  Location: [facility];  Service: Vascular;  Laterality: Left;  . INSERTION OF DIALYSIS CATHETER N/A 12/13/2015   Procedure: INSERTION OF DIALYSIS CATHETER;  Surgeon: Angelia Mould, MD;  Location: [place];  Service: Vascular;  Laterality: N/A;  . INSERTION OF DIALYSIS CATHETER Left 05/07/2017   Procedure: INSERTION OF DIALYSIS CATHETER LEFT INTERNAL JUGULAR PLACEMENT;  Surgeon: Angelia Mould, MD;  Location: [facility]  OR;  Service: Vascular;  Laterality: Left;  . INSERTION OF DIALYSIS CATHETER Left 12/15/2017   Procedure: INSERTION OF DIALYSIS CATHETER;  Surgeon: Serafina Mitchell, MD;  Location: [facility] OR;  Service: Vascular;  Laterality: Left;  . IR IVC FILTER PLMT / S&I Burke Keels GUID/MOD SED  12/18/2017  . IR RADIOLOGIST EVAL & MGMT  05/12/2017  . JOINT REPLACEMENT Left    hip  . LIGATION OF COMPETING BRANCHES OF ARTERIOVENOUS FISTULA Left 08/14/2016   Procedure: LIGATION OF  COMPETING BRANCHES OF ARTERIOVENOUS FISTULA LEFT UPPER ARM;  Surgeon: Angelia Mould, MD;  Location: [facility];  Service: Vascular;  Laterality: Left;  . PERIPHERAL VASCULAR CATHETERIZATION Left 06/09/2016   Procedure: Fistulagram;  Surgeon: Serafina Mitchell, MD;  Location: [facility];  Service: Cardiovascular;  Laterality: Left;  ARM  . PERIPHERAL VASCULAR CATHETERIZATION Left 06/09/2016   Procedure: Peripheral Vascular Balloon Angioplasty;  Surgeon: Serafina Mitchell, MD;  Location: [place] [place];  Service: Cardiovascular;  Laterality: Left;  upper arm venous  . PERIPHERAL VASCULAR CATHETERIZATION Left 06/25/2016   Procedure: Fistulagram;  Surgeon: Serafina Mitchell, MD;  Location: [facility];  Service: Cardiovascular;  Laterality: Left;  . REVISON OF ARTERIOVENOUS FISTULA Left 05/07/2017   Procedure: REPAIR OF PSEUDOANEURYSM  LEFT BRACHIO-CEPHALIC  ARM ARTERIOVENOUS FISTULA;  Surgeon: Angelia Mould, MD;  Location: [facility];  Service: Vascular;  Laterality: Left;     reports that he quit smoking about 19 years ago. He quit after 30.00 years of use. He has never used smokeless tobacco. He reports that he does not drink alcohol or use drugs.  Allergies  Allergen Reactions  . Meperidine Shortness Of Breath and Other (See Comments)    Increased heart rate Reaction to demerol  . Penicillins Other (See Comments)    "Knocks me out."   PATIENT HAS HAD A PCN REACTION WITH IMMEDIATE RASH, FACIAL/TONGUE/THROAT SWELLING, SOB, OR LIGHTHEADEDNESS WITH HYPOTENSION:  # # #  YES  >> SYNCOPE ### Has patient had a PCN reaction causing severe rash involving mucus membranes or skin necrosis: unknown PATIENT HAS HAD A PCN REACTION THAT REQUIRED TREATMENT AT MD'S OFFICE Has patient had a PCN reaction occurring within the last 10 years: no   . Sulfa Antibiotics Shortness Of Breath and Other (See Comments)    Increased heart rate  . Sulfonamide Derivatives Shortness Of Breath and Other (See  Comments)    TACHYCARDIA  . [place] Diarrhea, Nausea And Vomiting and Other (See Comments)    Constipation Reaction to whole milk  . Lactose Intolerance (Gi) Diarrhea, Nausea And Vomiting and Other (See Comments)    Constipation Reaction to whole milk  . Milk-Related Compounds Diarrhea, Nausea And Vomiting and Other (See Comments)    Constipation Reaction to whole milk  . Foltx [Elfolate Plus] Other (See Comments)    UNSPECIFIED REACTION  "Pt does not recall reaction to this"  . Betadine [Povidone Iodine] Itching, Rash and Other (See Comments)    burning  . Eggs Or Egg-Derived Products Nausea And Vomiting    Reaction to egg yolks - not whites      Family History  Problem Relation Age of Onset  . Arthritis Mother   . Cancer Father   . Heart disease Father   . Hypertension Sister   . Alcohol abuse Brother   . Diabetes Daughter   . Heart attack Paternal Uncle     Prior to Admission medications   Medication Sig Start Date  End Date Taking? Authorizing Provider  acetaminophen (TYLENOL) 500 MG tablet Take 500 mg by mouth every 6 (six) hours as needed for mild pain.   Yes [provider]  allopurinol (ZYLOPRIM) 300 MG tablet Take 300 mg by mouth daily.    Yes [provider]  Amino Acids-Protein Hydrolys (FEEDING SUPPLEMENT, PRO-STAT SUGAR FREE 64,) LIQD Take 30 mLs by mouth 2 (two) times daily.   Yes [provider]  amiodarone (PACERONE) 200 MG tablet Take 1 tablet (200 mg total) by mouth daily. 09/24/17  Yes Larey Dresser, MD  bacitracin ointment Apply 1 application topically 2 (two) times daily. 01/18/18  Yes Antonietta Breach, PA-C  diclofenac sodium (VOLTAREN) 1 % GEL Apply 2 g topically 3 (three) times daily. Apply to lower back, left hip and knee   Yes [provider]  diphenhydrAMINE (BENADRYL) 25 MG tablet Take 25 mg by mouth every 6 (six) hours as needed for allergies.   Yes [provider]  glyBURIDE (DIABETA) 2.5 MG tablet Take  1 tablet (2.5 mg total) by mouth daily with breakfast. 02/07/18 02/07/19 Yes Nita Sells, MD  lactulose (CHRONULAC) 10 GM/15ML solution Take 10 g by mouth daily as needed for mild constipation.   Yes [provider]  liver oil-zinc oxide (DESITIN) 40 % ointment Apply 1 application topically daily as needed for irritation.   Yes [provider]  magnesium oxide (MAG-OX) 400 MG tablet Take 2 tablets (800 mg total) by mouth daily. 02/07/18  Yes Nita Sells, MD  Melatonin 3 MG TABS Take 6 mg by mouth at bedtime as needed (sleep).    Yes [provider]  multivitamin (RENA-VIT) TABS tablet Take 1 tablet by mouth at bedtime.  12/20/15  Yes [provider]  neomycin-bacitracin-polymyxin (NEOSPORIN) [phone] ointment Apply 1 application topically daily as needed (area on scalp).   Yes [provider]  oxybutynin (DITROPAN) 5 MG tablet Take 5 mg by mouth 2 (two) times daily.   Yes [provider]  oxyCODONE-acetaminophen (PERCOCET) 7.5-325 MG tablet Take 1 tablet by mouth every 6 (six) hours as needed for moderate pain. 01/07/18  Yes Medina-Vargas, Monina C, NP  pantoprazole (PROTONIX) 40 MG tablet Take 40 mg by mouth at bedtime.   Yes [provider]  sevelamer carbonate (RENVELA) 800 MG tablet Take 2 tablets (1,600 mg total) by mouth 3 (three) times daily with meals. 12/18/15  Yes Geradine Girt, DO    Physical Exam: Vitals:   02/08/18 1200 02/08/18 1215 02/08/18 1230 02/08/18 1245  BP: 128/73 117/67 (!) 157/79 (!) 121/96  Pulse:    62  Resp: 17 14 19    Temp:      TempSrc:      SpO2:    (!) 74%      Constitutional: NAD, calm, comfortable Vitals:   02/08/18 1200 02/08/18 1215 02/08/18 1230 02/08/18 1245  BP: 128/73 117/67 (!) 157/79 (!) 121/96  Pulse:    62  Resp: 17 14 19    Temp:      TempSrc:      SpO2:    (!) 74%   Eyes: PERRL, lids and conjunctivae normal ENMT: Mucous membranes are moist. Posterior pharynx  clear of any exudate or lesions.Normal dentition.  Neck: normal, supple, no masses, no thyromegaly Respiratory: clear to auscultation bilaterally, no wheezing, no crackles. Normal respiratory effort. No accessory muscle use.  Cardiovascular: Regular rate and rhythm, no murmurs / rubs / gallops.2+  Lower extremity edema. 1+ pedal pulses. No carotid  bruits.  Abdomen: no tenderness, no masses palpated. No hepatosplenomegaly. Bowel sounds positive.  Musculoskeletal: no clubbing / cyanosis. No joint deformity upper and lower extremities. Good ROM, no contractures. Normal muscle tone.  Skin: diffuse rashes, lesions, ulcers. No induration Neurologic: CN 2-12 grossly intact. Sensation intact, DTR normal. Strength 5/5 in all 4.  Psychiatric: Normal judgment and insight. Alert and oriented x 3. Normal mood.   Labs on Admission: I have personally reviewed following labs and imaging studies  CBC: Recent Labs  Lab 02/03/18 1022 02/04/18 0114 02/04/18 0345 02/06/18 0703 02/08/18 1128  WBC 5.7  --  5.3 6.6 5.8  NEUTROABS 4.0  --   --   --   --   HGB 8.5* 9.9* 8.2* 8.7* 9.4*  HCT 25.1* 29.0* 24.3* 25.5* 26.6*  MCV 89.0  --  87.4 87.9 87.5  PLT 135*  --  146* 144* 188   Basic Metabolic Panel: Recent Labs  Lab 02/03/18 1022 02/03/18 2051 02/04/18 0114 02/04/18 0345 02/04/18 1414 02/06/18 0703 02/08/18 1128  NA 137 136 135 133*  --  125* 122*  K 3.5 3.3* 3.5 3.5  --  4.1 4.8  CL 100* 101 101 99*  --  90* 86*  CO2 16* 16*  --  17*  --  19* 15*  GLUCOSE 31* 82 38* 53*  --  118* 60*  BUN 72* 73* 63* 74*  --  62* 85*  CREATININE 10.69* 10.60* 10.90* 10.84*  --  8.74* 10.12*  CALCIUM 7.8* 7.5*  --  7.4*  --  7.5* 7.8*  MG  --   --   --   --  1.5* 1.6* 1.9  PHOS  --   --   --  6.7*  --   --  7.1*   GFR: Estimated Creatinine Clearance: 7.8 mL/min (A) (by C-G formula based on SCr of 10.12 mg/dL (H)). Liver Function Tests: Recent Labs  Lab 02/04/18 0345 02/06/18 0703 02/08/18 1128  AST 16  13* 28  ALT 13* 13* 16*  ALKPHOS 69 64 72  BILITOT 0.8 0.8 0.9  PROT 5.7* 5.7* 6.3*  ALBUMIN 2.7* 2.6* 2.8*   No results for input(s): LIPASE, AMYLASE in the last 168 hours. No results for input(s): AMMONIA in the last 168 hours. Coagulation Profile: No results for input(s): INR, PROTIME in the last 168 hours. Cardiac Enzymes: No results for input(s): CKTOTAL, CKMB, CKMBINDEX, TROPONINI in the last 168 hours. BNP (last 3 results) No results for input(s): PROBNP in the last 8760 hours. HbA1C: No results for input(s): HGBA1C in the last 72 hours. CBG: Recent Labs  Lab 02/07/18 1127 02/08/18 0955 02/08/18 1043 02/08/18 1151 02/08/18 1248  GLUCAP 86 39* 87 70 83   Lipid Profile: No results for input(s): CHOL, HDL, LDLCALC, TRIG, CHOLHDL, LDLDIRECT in the last 72 hours. Thyroid Function Tests: No results for input(s): TSH, T4TOTAL, FREET4, T3FREE, THYROIDAB in the last 72 hours. Anemia Panel: No results for input(s): VITAMINB12, FOLATE, FERRITIN, TIBC, IRON, RETICCTPCT in the last 72 hours. Urine analysis:    Component Value Date/Time   COLORURINE YELLOW 06/26/2017 1652   APPEARANCEUR TURBID (A) 06/26/2017 1652   LABSPEC 1.015 06/26/2017 1652   PHURINE 6.0 06/26/2017 1652   GLUCOSEU NEGATIVE 06/26/2017 1652   HGBUR LARGE (A) 06/26/2017 1652   BILIRUBINUR NEGATIVE 06/26/2017 1652   KETONESUR NEGATIVE 06/26/2017 1652   PROTEINUR >=300 (A) 06/26/2017 1652   UROBILINOGEN 0.2 05/28/2010 1109   NITRITE NEGATIVE 06/26/2017 1652   LEUKOCYTESUR MODERATE (  A) 06/26/2017 1652   Sepsis Labs: @LABRCNTIP (procalcitonin:4,lacticidven:4) ) Recent Results (from the past 240 hour(s))  MRSA PCR Screening     Status: Abnormal   Collection Time: 02/04/18  3:33 AM  Result Value Ref Range Status   MRSA by PCR POSITIVE (A) NEGATIVE Final    Comment:        The GeneXpert MRSA Assay (FDA approved for NASAL specimens only), is one component of a comprehensive MRSA  colonization surveillance program. It is not intended to diagnose MRSA infection nor to guide or monitor treatment for MRSA infections. RESULT CALLED TO, READ BACK BY AND VERIFIED WITHEverlean Alstrom RN [phone] 02/04/18 A BROWNING Performed at [facility], [place] [name], [place], [place] [address]      Radiological Exams on Admission: No results found.    Assessment/Plan Principal Problem:   Hypoglycemia Active Problems:   Fluid overload   ESRD (end stage renal disease) on dialysis ([name])   Type 2 diabetes mellitus with peripheral neuropathy ([place])    #1 hypoglycemia: This is refractory. Most likely due to resuming his clear bright. Patient on dialysis so most likely retaining his medications. At this point we'll discontinue oral hypoglycemics. Monitor blood sugar every 2 hours. D10 on hold but can be resumed if blood sugar drops. We will keep him on observation until the glyburide leaves his system.  #2 fluid overload: Patient appears to have anasarca. He is due for hemodialysis today. Nephrology will arrange that.  #3 End-stage renal disease: Again patient on hemodialysis Mondays Wednesdays and Fridays. We will scheduled hemodialysis today.  #4 diabetes: Due to hypoglycemia we will hold oral hypoglycemics however we will start sliding scale insulin as soon as patient starts having high blood sugars.  DVT prophylaxis: Heparin Code Status: Full Family Communication: Daughter at bedside Disposition Plan: home possibly tomorrow  Consults called: Dr [name], Nephrology Admission status: Observation   Severity of Illness: The appropriate patient status for this patient is OBSERVATION. Observation status is judged to be reasonable and necessary in order to provide the required intensity of service to ensure the patient's safety. The patient's presenting symptoms, physical exam findings, and initial radiographic and laboratory data in the context of their medical condition is felt  to place them at decreased risk for further clinical deterioration. Furthermore, it is anticipated that the patient will be medically stable for discharge from the hospital within 2 midnights of admission. The following factors support the patient status of observation.   " The patient's presenting symptoms include Confusion. " The physical exam findings include Hypoglycemia. " The initial radiographic and laboratory data are Bs of 39.Marland Kitchen     Barbette Merino MD [facility] Pager [phone]  If 7PM-7AM, please contact night-coverage [email or web address] Password [email or web address]  02/08/2018, 1:57 PM

## 2018-02-08 NOTE — ED Provider Notes (Signed)
[facility] EMERGENCY DEPARTMENT Provider Note   CSN: [id number] Arrival date & time: 02/08/18  3614     History   Chief Complaint Chief Complaint  Patient presents with  . Hypoglycemia    HPI Albert Stanley is a 72 y.o. 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  Patient's wife reports that he was discharged from the hospital yesterday.    His blood sugar last night at 11 PM was 115.  She reports that when the home health nurse checked it this morning that it was 43 at home.  She also reports that he was supposed be dialyzed today, but she was told to take the patient to the hospital because his blood sugar was too low for dialysis.  She states the patient has been " talking foggy" and has been mumbling more and more difficult to understand.  She reports this is been going on for some time, but worsened last night.   CBG 39 on arrival. Patient is asymptomatic and A&Ox3.  He has no complaints at this time including, CP, dyspnea, headache, confusion, or N/V/D.  Patient was last dialyzed on 4/27.  The history is provided by the patient. No language interpreter was used.  Hypoglycemia  Associated symptoms: no shortness of breath     Past Medical History:  Diagnosis Date  . Arthritis    Osteoarthritis  . Asthma   . Bladder cancer ([place]) dx'd 1990   surg only  . CHF (congestive heart failure) ([place])   . Diabetes mellitus without complication ([place])   . ESRD (end stage renal disease) ([name])    Tues, Thurs, Sat dialysis  . GERD (gastroesophageal reflux disease)   . History of cardiac catheterization    a. LHC 5/17: no obstructive CAD  . History of DVT (deep vein thrombosis)    2003 ish  . History of nuclear stress test    a. Myoview 4/17: EF 39%, inf, inf-lat, apical inf, apical lat, apical scar,  intermediate risk  . Hyperlipidemia   . Hypertension   . NICM (nonischemic cardiomyopathy) ([place])    a. Echo 2/17: mod LVH, EF 35-40%, inf-lat and inf-sept HK, mod MR, severe LAE, small pericardial effusion    Patient Active Problem List   Diagnosis Date Noted  . Hypoglycemia secondary to sulfonylurea 02/04/2018  . Hypoglycemia 02/03/2018  . Hematuria 01/06/2018  . Labile blood pressure   . Essential hypertension   . Acute on chronic diastolic heart failure ([place])   . Type 2 diabetes mellitus with peripheral neuropathy ([name])   . Acute deep vein thrombosis (DVT) of femoral vein of right lower extremity ([place])   . Traumatic subdural hematoma ([place]) 12/16/2017  . ESRD (end stage renal disease) ([place])   . ESRD (end stage renal disease) on dialysis ([place])   . Dysphagia   . Acute blood loss anemia   . Anemia of chronic disease   . Chronic diastolic congestive heart failure ([place])   . Diabetes mellitus type 2 in nonobese ([name])   . Benign essential HTN   . Nonischemic cardiomyopathy ([place])   . Transaminitis   . Encounter for central line placement   . Endotracheally intubated   . Ventilator dependent ([place])   . Chronic renal impairment   . Chronic in-center hemodialysis status ([place])   . S/P craniotomy 12/05/2017  . Hyperkalemia 12/02/2017  .  Malnutrition of moderate degree 12/18/2016  . PVC's (premature ventricular contractions)   . Anemia associated with chronic renal failure   . End-stage renal disease on hemodialysis ([place]) 12/09/2016  . Dyslipidemia associated with type 2 diabetes mellitus ([place]) 12/09/2016  . Acute on chronic systolic and diastolic heart failure, NYHA class 1 ([place]) 12/09/2016  . Diabetes mellitus with diabetic nephropathy without long-term current use of insulin ([place]) 12/09/2016  . Type 2 diabetes mellitus ([place]) 12/11/2015  . Fluid overload 12/06/2015  . History of DVT (deep vein thrombosis) 12/04/2015  . Renal hematoma 12/04/2015  . H/O: gout 11/16/2013  .  Trochanteric bursitis 10/17/2013  . 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 Lumbar degenerative disc disease 07/17/2013    Past Surgical History:  Procedure Laterality Date  . arthroscopic knee surgery Left   . AV FISTULA PLACEMENT Left 12/13/2015   Procedure: ARTERIOVENOUS (AV) FISTULA CREATION;  Surgeon: Angelia Mould, MD;  Location: [facility] OR;  Service: Vascular;  Laterality: Left;  . AV FISTULA PLACEMENT Left 12/15/2017   Procedure: INSERTION OF ARTERIOVENOUS (AV) GORE-TEX GRAFT ARM;  Surgeon: Serafina Mitchell, MD;  Location: [facility];  Service: Vascular;  Laterality: Left;  . BACK SURGERY     2 times  1960  . CARDIAC CATHETERIZATION N/A 02/28/2016   Procedure: Left Heart Cath and Coronary Angiography;  Surgeon: Larey Dresser, MD;  Location: [place] LAB;  Service: Cardiovascular;  Laterality: N/A;  . CRANIOTOMY N/A 12/05/2017   Procedure: CRANIOTOMY HEMATOMA EVACUATION SUBDURAL;  Surgeon: Ashok Pall, MD;  Location: [facility];  Service: Neurosurgery;  Laterality: N/A;  . CYSTOSCOPY    . FISTULA SUPERFICIALIZATION Left 08/14/2016   Procedure: FISTULA SUPERFICIALIZATION LEFT UPPER ARM;  Surgeon: Angelia Mould, MD;  Location: [facility];  Service: Vascular;  Laterality: Left;  . INSERTION OF DIALYSIS CATHETER N/A 12/13/2015   Procedure: INSERTION OF DIALYSIS CATHETER;  Surgeon: Angelia Mould, MD;  Location: [place];  Service: Vascular;  Laterality: N/A;  . INSERTION OF DIALYSIS CATHETER Left 05/07/2017   Procedure: INSERTION OF DIALYSIS CATHETER LEFT INTERNAL JUGULAR PLACEMENT;  Surgeon: Angelia Mould, MD;  Location: [facility];  Service: Vascular;  Laterality: Left;  . INSERTION OF DIALYSIS CATHETER Left 12/15/2017   Procedure: INSERTION OF DIALYSIS CATHETER;  Surgeon: Serafina Mitchell, MD;  Location: [facility];  Service: Vascular;  Laterality: Left;  . IR IVC FILTER PLMT / S&I Burke Keels GUID/MOD SED  12/18/2017  . IR RADIOLOGIST EVAL & MGMT  05/12/2017  . JOINT REPLACEMENT Left    hip  . LIGATION  OF COMPETING BRANCHES OF ARTERIOVENOUS FISTULA Left 08/14/2016   Procedure: LIGATION OF COMPETING BRANCHES OF ARTERIOVENOUS FISTULA LEFT UPPER ARM;  Surgeon: Angelia Mould, MD;  Location: [name];  Service: Vascular;  Laterality: Left;  . PERIPHERAL VASCULAR CATHETERIZATION Left 06/09/2016   Procedure: Fistulagram;  Surgeon: Serafina Mitchell, MD;  Location: [facility];  Service: Cardiovascular;  Laterality: Left;  ARM  . PERIPHERAL VASCULAR CATHETERIZATION Left 06/09/2016   Procedure: Peripheral Vascular Balloon Angioplasty;  Surgeon: Serafina Mitchell, MD;  Location: [facility];  Service: Cardiovascular;  Laterality: Left;  upper arm venous  . PERIPHERAL VASCULAR CATHETERIZATION Left 06/25/2016   Procedure: Fistulagram;  Surgeon: Serafina Mitchell, MD;  Location: [facility];  Service: Cardiovascular;  Laterality: Left;  . REVISON OF ARTERIOVENOUS FISTULA Left 05/07/2017   Procedure: REPAIR OF PSEUDOANEURYSM  LEFT BRACHIO-CEPHALIC  ARM ARTERIOVENOUS FISTULA;  Surgeon: Angelia Mould,  MD;  Location: [facility] OR;  Service: Vascular;  Laterality: Left;        Home Medications    Prior to Admission medications   Medication Sig Start Date End Date Taking? Authorizing Provider  acetaminophen (TYLENOL) 500 MG tablet Take 500 mg by mouth every 6 (six) hours as needed for mild pain.   Yes [provider]  allopurinol (ZYLOPRIM) 300 MG tablet Take 300 mg by mouth daily.    Yes [provider]  Amino Acids-Protein Hydrolys (FEEDING SUPPLEMENT, PRO-STAT SUGAR FREE 64,) LIQD Take 30 mLs by mouth 2 (two) times daily.   Yes [provider]  amiodarone (PACERONE) 200 MG tablet Take 1 tablet (200 mg total) by mouth daily. 09/24/17  Yes Larey Dresser, MD  bacitracin ointment Apply 1 application topically 2 (two) times daily. 01/18/18  Yes Antonietta Breach, PA-C  diclofenac sodium (VOLTAREN) 1 % GEL Apply 2 g topically 3 (three) times daily. Apply to lower back,  left hip and knee   Yes [provider]  diphenhydrAMINE (BENADRYL) 25 MG tablet Take 25 mg by mouth every 6 (six) hours as needed for allergies.   Yes [provider]  glyBURIDE (DIABETA) 2.5 MG tablet Take 1 tablet (2.5 mg total) by mouth daily with breakfast. 02/07/18 02/07/19 Yes Nita Sells, MD  lactulose (CHRONULAC) 10 GM/15ML solution Take 10 g by mouth daily as needed for mild constipation.   Yes [provider]  liver oil-zinc oxide (DESITIN) 40 % ointment Apply 1 application topically daily as needed for irritation.   Yes [provider]  magnesium oxide (MAG-OX) 400 MG tablet Take 2 tablets (800 mg total) by mouth daily. 02/07/18  Yes Nita Sells, MD  Melatonin 3 MG TABS Take 6 mg by mouth at bedtime as needed (sleep).    Yes [provider]  multivitamin (RENA-VIT) TABS tablet Take 1 tablet by mouth at bedtime.  12/20/15  Yes [provider]  neomycin-bacitracin-polymyxin (NEOSPORIN) [phone] ointment Apply 1 application topically daily as needed (area on scalp).   Yes [provider]  oxybutynin (DITROPAN) 5 MG tablet Take 5 mg by mouth 2 (two) times daily.   Yes [provider]  oxyCODONE-acetaminophen (PERCOCET) 7.5-325 MG tablet Take 1 tablet by mouth every 6 (six) hours as needed for moderate pain. 01/07/18  Yes Medina-Vargas, Monina C, NP  pantoprazole (PROTONIX) 40 MG tablet Take 40 mg by mouth at bedtime.   Yes [provider]  sevelamer carbonate (RENVELA) 800 MG tablet Take 2 tablets (1,600 mg total) by mouth 3 (three) times daily with meals. 12/18/15  Yes Geradine Girt, DO    Family History Family History  Problem Relation Age of Onset  . Arthritis Mother   . Cancer Father   . Heart disease Father   . Hypertension Sister   . Alcohol abuse Brother   . Diabetes Daughter   . Heart attack Paternal Uncle     Social History Social History   Tobacco Use  . Smoking status:  Former Smoker    Years: 30.00    Last attempt to quit: 10/12/1998    Years since quitting: 19.3  . Smokeless tobacco: Never Used  Substance Use Topics  . Alcohol use: No    Alcohol/week: 0.0 oz    Comment: Used to drink on the weekends, quit 2 years ago  . Drug use: No     Allergies   Meperidine; Penicillins; Sulfa antibiotics; Sulfonamide derivatives; Lac bovis; Lactose intolerance (gi); Milk-related  compounds; Foltx [elfolate plus]; Betadine [povidone iodine]; and Eggs or egg-derived products   Review of Systems Review of Systems  Constitutional: Negative for appetite change and fever.  Respiratory: Negative for shortness of breath.   Cardiovascular: Negative for chest pain.  Gastrointestinal: Negative for abdominal pain.  Genitourinary: Negative for dysuria.  Musculoskeletal: Negative for back pain.  Skin: Negative for rash.  Allergic/Immunologic: Negative for immunocompromised state.  Neurological: Negative for headaches.  Psychiatric/Behavioral: Negative for confusion.     Physical Exam Updated Vital Signs BP (!) 122/59   Pulse 77   Temp 97.8 F (36.6 C) (Oral)   Resp 18   SpO2 (!) 74%   Physical Exam  Constitutional: He appears well-developed. No distress.  HENT:  Head: Normocephalic.  Eyes: Pupils are equal, round, and reactive to light. Conjunctivae and EOM are normal. No scleral icterus.  Neck: Neck supple.  Cardiovascular: Normal rate, regular rhythm, normal heart sounds and intact distal pulses. Exam reveals no gallop and no friction rub.  No murmur heard. Pulmonary/Chest: Effort normal and breath sounds normal. No stridor. No respiratory distress. He has no wheezes. He has no rales. He exhibits no tenderness.  Abdominal: Soft. He exhibits no distension and no mass. There is no tenderness. There is no rebound and no guarding. No hernia.  Musculoskeletal: He exhibits no edema, tenderness or deformity.  Neurological: He is alert.  Alert and oriented x3.   Cranial nerves II through XII are grossly intact.  Sensation is intact throughout.  5 out of 5 strength bilateral upper and lower extremities against resistance.  Moves all 4 extremities.  Speaks in complete, fluent sentences.  Skin: Skin is warm and dry. He is not diaphoretic.  Psychiatric: His behavior is normal.  Nursing note and vitals reviewed.    ED Treatments / Results  Labs (all labs ordered are listed, but only abnormal results are displayed) Labs Reviewed  CBC - Abnormal; Notable for the following components:      Result Value   RBC 3.04 (*)    Hemoglobin 9.4 (*)    HCT 26.6 (*)    RDW 17.5 (*)    All other components within normal limits  COMPREHENSIVE METABOLIC PANEL - Abnormal; Notable for the following components:   Sodium 122 (*)    Chloride 86 (*)    CO2 15 (*)    Glucose, Bld 60 (*)    BUN 85 (*)    Creatinine, Ser 10.12 (*)    Calcium 7.8 (*)    Total Protein 6.3 (*)    Albumin 2.8 (*)    ALT 16 (*)    GFR calc non Af Amer 4 (*)    GFR calc Af Amer 5 (*)    Anion gap 21 (*)    All other components within normal limits  PHOSPHORUS - Abnormal; Notable for the following components:   Phosphorus 7.1 (*)    All other components within normal limits  CBG MONITORING, ED - Abnormal; Notable for the following components:   Glucose-Capillary 39 (*)    All other components within normal limits  CBG MONITORING, ED - Abnormal; Notable for the following components:   Glucose-Capillary 57 (*)    All other components within normal limits  CBG MONITORING, ED - Abnormal; Notable for the following components:   Glucose-Capillary 217 (*)    All other components within normal limits  MAGNESIUM  CBG MONITORING, ED  CBG MONITORING, ED  CBG MONITORING, ED  CBG MONITORING, ED  CBG  MONITORING, ED    EKG EKG Interpretation  Date/Time:  Tuesday February 08 2018 09:57:12 EDT Ventricular Rate:  70 PR Interval:    QRS Duration: 107 QT Interval:  462 QTC  Calculation: 499 R Axis:   116 Text Interpretation:  Sinus rhythm Ventricular premature complex Right axis deviation Borderline repol abnrm, anterolateral leads Borderline prolonged QT interval No acute changes Nonspecific ST and T wave abnormality Confirmed by Varney Biles [phone]) on 02/08/2018 11:22:01 AM   Radiology No results found.  Procedures Procedures (including critical care time)  Medications Ordered in ED Medications  0.9 %  sodium chloride infusion (has no administration in time range)  0.9 %  sodium chloride infusion (has no administration in time range)  heparin injection 1,000 Units (has no administration in time range)  dextrose 10 % infusion ( Intravenous New Bag/Given 02/08/18 1401)  sevelamer carbonate (RENVELA) tablet 2,400 mg (has no administration in time range)  midodrine (PROAMATINE) tablet 5 mg (has no administration in time range)  calcitRIOL (ROCALTROL) capsule 3.25 mcg (has no administration in time range)  dextrose 50 % solution (50 mLs Intravenous Given 02/08/18 1009)  glucagon (human recombinant) (GLUCAGEN) injection 1 mg (1 mg Intravenous Given 02/08/18 1402)     Initial Impression / Assessment and Plan / ED Course  I have reviewed the triage vital signs and the nursing notes.  Pertinent labs & imaging results that were available during my care of the patient were reviewed by me and considered in my medical decision making (see chart for details).     72 year old male with history of ESRD (R/T/S), chronic combined CHF (EF 45-85%, grade 1 diastolic dysfunction in 9/29), HTN, HLD who presents to the emergency department with a chief complaint of hypoglycemia.  CBG 39 on arrival.  He was given an amp of D50, improving to 87 at q1h. repeat CBG at 3 hours was 57.  Again, the patient was asymptomatic.  Glucagon and D10 infusion, which the patient was on during his most recent hospitalization was initiated, and his blood sugar stabilized.,  But he was  asymptomatic.   The patient was seen and evaluated with Dr. Kathrynn Humble, attending physician.  Hyponatremic at 122.  Consulted nephrology and Dr. Jonnie Finner will see the patient after admission.  Consulted the hospitalist team and Dr. Mcarthur Rossetti will admit.  Final Clinical Impressions(s) / ED Diagnoses   Final diagnoses:  Hypoglycemia    ED Discharge Orders    None       Joanne Gavel, PA-C 02/08/18 [place], Ankit, MD 02/09/18 2446    Varney Biles, MD 02/09/18 [phone]

## 2018-02-08 NOTE — Progress Notes (Addendum)
[facility] Progress Note   Subjective:  Discharged yesterday. Returned to ED this am with hypoglycemia/weakness. Due for routine HD today, but too weak to go to outpatient center.  CBG 39 on arrival. D10 infusion started. Obs admit  Seen in ED, drowsy, but oriented x3. No specific c/os   Objective Vitals:   02/08/18 1230 02/08/18 1245 02/08/18 1415 02/08/18 1430  BP: (!) 157/79 (!) 121/96 (!) 142/87 (!) 108/93  Pulse:  62  77  Resp: 19  (!) 22 18  Temp:      TempSrc:      SpO2:  (!) 74%     Physical Exam General: Overweight elderly male; +facial edema NAD Heart: RRR Lungs: CTAB  Abdomen: soft NT Extremities: LUA edema; LE bilat pitting edema severe LE edema Dialysis Access: L IJ TDC dsg intact; LUE AVG +bruit   Dialysis Orders:  [facility] TTS 4h 400/800 88.5kg 2K/2Ca Profile 4 LUE AVG placed 12/15/17 (should be ok to use) L IJ TDC  No Heparin   Mircera 255mcg IV q 2 weeks (last 4/11)  Calcitriol 3.43mcg PO TIW   Assessment/Plan: 1. Hypoglycemia - Refractory. Glipizide d/c'd during prev adm 2. ESRD - TTS. Orders written for HD today K 4.8  3. Anemia -  Aranesp 200 dosed 4/26  4. MBD- Phos ^ Cont. Calcitirol/Renvela 5. Hypotension/volume - Chronic edema with UF limited by hypotension. On midodrine 10mg   for BP support. Try for 3.5L today as BP allows   6. Hx SDH s/p craniotomy 7. Hx DVT s/p IVC filter 8. NICM/CHF  9. Debility/weakness   Lynnda Child PA-C [facility] Pager [phone] 02/08/2018,4:23 PM  LOS: 0 days   Pt seen, examined, agree w assess/plan as above with additions as indicated. ESRD pt w/ hypoglycemia chronic debility vol overload w/ anasarca / severe leg edema plan dialysis tonight may need serial dialysis.  Kelly Splinter MD [facility] pager [phone]    cell [phone] 02/09/2018, 8:17 AM       Additional Objective Labs: Basic Metabolic Panel: Recent Labs  Lab 02/04/18 0345 02/06/18 0703  02/08/18 1128  NA 133* 125* 122*  K 3.5 4.1 4.8  CL 99* 90* 86*  CO2 17* 19* 15*  GLUCOSE 53* 118* 60*  BUN 74* 62* 85*  CREATININE 10.84* 8.74* 10.12*  CALCIUM 7.4* 7.5* 7.8*  PHOS 6.7*  --  7.1*   CBC: Recent Labs  Lab 02/03/18 1022  02/04/18 0345 02/06/18 0703 02/08/18 1128  WBC 5.7  --  5.3 6.6 5.8  NEUTROABS 4.0  --   --   --   --   HGB 8.5*   < > 8.2* 8.7* 9.4*  HCT 25.1*   < > 24.3* 25.5* 26.6*  MCV 89.0  --  87.4 87.9 87.5  PLT 135*  --  146* 144* 163   < > = values in this interval not displayed.   Blood Culture    Component Value Date/Time   SDES URINE, CATHETERIZED 06/26/2017 1652   SPECREQUEST NONE 06/26/2017 1652   CULT >=100,000 COLONIES/mL SERRATIA MARCESCENS (A) 06/26/2017 1652   REPTSTATUS 06/28/2017 FINAL 06/26/2017 1652    Cardiac Enzymes: No results for input(s): CKTOTAL, CKMB, CKMBINDEX, TROPONINI in the last 168 hours. CBG: Recent Labs  Lab 02/08/18 1151 02/08/18 1248 02/08/18 1358 02/08/18 1448 02/08/18 1534  GLUCAP 70 83 57* 68 217*   Iron Studies: No results for input(s): IRON, TIBC, TRANSFERRIN, FERRITIN in the last 72 hours. Lab Results  Component  Value Date   INR 1.59 01/18/2018   INR 1.72 12/09/2017   INR 1.69 12/08/2017   Medications: . sodium chloride    . sodium chloride    . dextrose 10 mL/hr at 02/08/18 1401

## 2018-02-08 NOTE — ED Notes (Signed)
Pt's dextrose 10% infusion was found to be turned off at this time.  Infusion is restarted per this RN at 47ml/hr.  Receiving RN, [place] on 5W made aware of findings.

## 2018-02-08 NOTE — Progress Notes (Signed)
Patient admitted to the unit via stretcher from the ED at 1755 D10 running at 50 ml/hr via 20g IV in right wrist.. Tele monitor applied and CBG and VS done. CBG 52 one amp D50 given. Patient lethargic but responds when spoken too. Patient oriented times 4 but drowsy. Skin assessments done with Deloria Lair, RN. Findings documented in Flowsheet. MRSA swab sent down per protocol. Foley from home draining to leg bag. Foley care and peri care preformed and Leg bag changed to standard drainage bag. Dialysis tunneled cath noted to left shoulder. With dressing clean dry and intact. Date on dressing 02/04/2018. Fall band arm restrict band and allergy band placed on left arm. Fistula noted to the LUA bruit and thrill present.

## 2018-02-08 NOTE — ED Notes (Signed)
Got patient undress into a gown on the moniton did ekg shown to er doctor patient is resting with nurse at bedside

## 2018-02-08 NOTE — ED Triage Notes (Signed)
Patient arrived via EMS-he was going to dialysis but was not accepted due to his "low blood sugar" EMS CBG check on arrival was 57, they gave him 15 oral glucose, re-checked and it was 58, they gave him another 15g oral glucose.  Upon arrival patient's CBG was 39 A&O X4

## 2018-02-08 NOTE — Progress Notes (Signed)
Inpatient Diabetes Program Recommendations  AACE/ADA: New Consensus Statement on Inpatient Glycemic Control (2015)  Target Ranges:  Prepandial:   less than 140 mg/dL      Peak postprandial:   less than 180 mg/dL (1-2 hours)      Critically ill patients:  140 - 180 mg/dL   Lab Results  Component Value Date   GLUCAP 70 02/08/2018   HGBA1C 6.4 (H) 12/09/2016    Review of Glycemic ControlResults for ADELFO, DIEBEL (MRN [id number]) as of 02/08/2018 12:20  Ref. Range 02/07/2018 07:59 02/07/2018 11:27 02/08/2018 09:55 02/08/2018 10:43 02/08/2018 11:51  Glucose-Capillary Latest Ref Range: 65 - 99 mg/dL 80 86 39 (LL) 87 70    Diabetes history: Type 2 DM Outpatient Diabetes medications: Glyburide 2.5 mg daily Inpatient Diabetes Program Recommendations:   Note patient just d/c'd on 4/29 and readmitted with hypoglycemia.  Please d/c home Glyburide and Glipizide due to long half-life and hypoglycemia from home/d/c medications.  Called and discussed with RN. She states that patient has not taken the Glyburide since being discharged yesterday (and has not had any since before admit on 02/03/18).   Thanks,  Adah Perl, RN, BC-ADM Inpatient Diabetes Coordinator Pager [phone] (8a-5p)

## 2018-02-08 NOTE — ED Notes (Signed)
Pt CBG 83, notified Caryl Pina Investment banker, corporate)

## 2018-02-08 NOTE — ED Notes (Signed)
Pt CBG 68, notified Caryl Pina Investment banker, corporate). Pt given apple juice per Caryl Pina (RN)

## 2018-02-09 ENCOUNTER — Encounter (HOSPITAL_COMMUNITY): Payer: Self-pay | Admitting: General Practice

## 2018-02-09 DIAGNOSIS — E162 Hypoglycemia, unspecified: Secondary | ICD-10-CM

## 2018-02-09 DIAGNOSIS — E1142 Type 2 diabetes mellitus with diabetic polyneuropathy: Secondary | ICD-10-CM | POA: Diagnosis not present

## 2018-02-09 DIAGNOSIS — N186 End stage renal disease: Secondary | ICD-10-CM

## 2018-02-09 DIAGNOSIS — Z992 Dependence on renal dialysis: Secondary | ICD-10-CM

## 2018-02-09 DIAGNOSIS — L8990 Pressure ulcer of unspecified site, unspecified stage: Secondary | ICD-10-CM

## 2018-02-09 LAB — CBC
HCT: 24.5 % — ABNORMAL LOW (ref 39.0–52.0)
Hemoglobin: 8.5 g/dL — ABNORMAL LOW (ref 13.0–17.0)
MCH: 29.6 pg (ref 26.0–34.0)
MCHC: 34.7 g/dL (ref 30.0–36.0)
MCV: 85.4 fL (ref 78.0–100.0)
PLATELETS: 123 10*3/uL — AB (ref 150–400)
RBC: 2.87 MIL/uL — ABNORMAL LOW (ref 4.22–5.81)
RDW: 16.9 % — AB (ref 11.5–15.5)
WBC: 5.6 10*3/uL (ref 4.0–10.5)

## 2018-02-09 LAB — GLUCOSE, CAPILLARY
GLUCOSE-CAPILLARY: 82 mg/dL (ref 65–99)
GLUCOSE-CAPILLARY: 108 mg/dL — AB (ref 65–99)
Glucose-Capillary: 80 mg/dL (ref 65–99)
Glucose-Capillary: 95 mg/dL (ref 65–99)
Glucose-Capillary: 96 mg/dL (ref 65–99)
Glucose-Capillary: 151 mg/dL — ABNORMAL HIGH (ref 65–99)
Glucose-Capillary: 164 mg/dL — ABNORMAL HIGH (ref 65–99)
Glucose-Capillary: 149 mg/dL — ABNORMAL HIGH (ref 65–99)
Glucose-Capillary: 155 mg/dL — ABNORMAL HIGH (ref 65–99)
Glucose-Capillary: 124 mg/dL — ABNORMAL HIGH (ref 65–99)

## 2018-02-09 LAB — BASIC METABOLIC PANEL
Anion gap: 15 (ref 5–15)
BUN: 51 mg/dL — AB (ref 6–20)
CHLORIDE: 91 mmol/L — AB (ref 101–111)
CO2: 21 mmol/L — AB (ref 22–32)
CREATININE: 7.16 mg/dL — AB (ref 0.61–1.24)
Calcium: 7.4 mg/dL — ABNORMAL LOW (ref 8.9–10.3)
GFR calc Af Amer: 8 mL/min — ABNORMAL LOW (ref >60)
GFR calc non Af Amer: 7 mL/min — ABNORMAL LOW (ref >60)
Glucose, Bld: 100 mg/dL — ABNORMAL HIGH (ref 65–99)
Potassium: 3.5 mmol/L (ref 3.5–5.1)
Sodium: 127 mmol/L — ABNORMAL LOW (ref 135–145)

## 2018-02-09 LAB — MRSA PCR SCREENING: MRSA by PCR: POSITIVE — AB

## 2018-02-09 NOTE — Progress Notes (Addendum)
[facility] Progress Note   Subjective:  Feels better today.  Had HD last night with 4L off  No specific c/os, just weak  Objective Vitals:   02/09/18 0022 02/09/18 0055 02/09/18 0403 02/09/18 0405  BP: (!) 104/48 117/80 116/64   Pulse: 67 72 70   Resp: 12 18 18    Temp: (!) 97.4 F (36.3 C) 98.1 F (36.7 C) 97.9 F (36.6 C)   TempSrc: Axillary Oral    SpO2: 100% 100% (!) 88% 100%  Weight: 98.2 kg (216 lb 7.9 oz)      Physical Exam General: WNWD male with facial edema NAD Heart: RRR Lungs: grossly CTAB Abdomen: obese soft NT Extremities: 2-3+ Pitting bial LE edema Dialysis Access: L IJ TDC dsg intact; LUE AVG +bruit    Dialysis Orders:  [facility] TTS 4h 400/800 88.5kg 2K/2Ca Profile 4 LUE AVG placed 12/15/17 (haven't used yet) L IJ TDC  No Heparin d/t SDH  Mircera 273mcg IV q 2 weeks (last 4/11)  Calcitriol 3.2mcg PO TIW  Assessment/Plan: 1. Hypoglycemia - Refractory. PO meds discontinued. Per primary  2. ESRD - TTS.  Offered extra dialysis today for volume, but he declines 3. Anemia -  Aranesp 200 dosed 4/26  4. MBD- Phos ^ Cont. Calcitirol/Renvela 5. Hypotension/volume - On midodrine. Chronic edema. Tolerating volume removal but still 10kg over EDW.  6. Hx SDH s/p craniotomy 7. Hx DVT s/p IVC filter 8. NICM/CHF  9. Debility/weakness    Lynnda Child PA-C [facility] Pager [phone] 02/09/2018,11:12 AM  LOS: 0 days   Additional Objective Labs: Basic Metabolic Panel: Recent Labs  Lab 02/04/18 0345 02/06/18 0703 02/08/18 1128 02/09/18 0439  NA 133* 125* 122* 127*  K 3.5 4.1 4.8 3.5  CL 99* 90* 86* 91*  CO2 17* 19* 15* 21*  GLUCOSE 53* 118* 60* 100*  BUN 74* 62* 85* 51*  CREATININE 10.84* 8.74* 10.12* 7.16*  CALCIUM 7.4* 7.5* 7.8* 7.4*  PHOS 6.7*  --  7.1*  --    CBC: Recent Labs  Lab 02/03/18 1022  02/04/18 0345 02/06/18 0703 02/08/18 1128 02/09/18 0439  WBC 5.7  --  5.3 6.6 5.8 5.6  NEUTROABS 4.0  --    --   --   --   --   HGB 8.5*   < > 8.2* 8.7* 9.4* 8.5*  HCT 25.1*   < > 24.3* 25.5* 26.6* 24.5*  MCV 89.0  --  87.4 87.9 87.5 85.4  PLT 135*  --  146* 144* 163 123*   < > = values in this interval not displayed.   Blood Culture    Component Value Date/Time   SDES URINE, CATHETERIZED 06/26/2017 1652   SPECREQUEST NONE 06/26/2017 1652   CULT >=100,000 COLONIES/mL SERRATIA MARCESCENS (A) 06/26/2017 1652   REPTSTATUS 06/28/2017 FINAL 06/26/2017 1652    Cardiac Enzymes: No results for input(s): CKTOTAL, CKMB, CKMBINDEX, TROPONINI in the last 168 hours. CBG: Recent Labs  Lab 02/09/18 0053 02/09/18 0238 02/09/18 0359 02/09/18 0640 02/09/18 0835  GLUCAP 95 80 82 108* 96   Iron Studies: No results for input(s): IRON, TIBC, TRANSFERRIN, FERRITIN in the last 72 hours. Lab Results  Component Value Date   INR 1.59 01/18/2018   INR 1.72 12/09/2017   INR 1.69 12/08/2017   Medications: . dextrose 50 mL/hr at 02/09/18 0945  . dextrose     . allopurinol  300 mg Oral Daily  . amiodarone  200 mg Oral Daily  . bacitracin  1 application Topical BID  . [START ON 02/10/2018] calcitRIOL  3.25 mcg Oral Q T,Th,Sa-HD  . diclofenac sodium  2 g Topical TID  . feeding supplement (PRO-STAT SUGAR FREE 64)  30 mL Oral BID  . heparin  5,000 Units Subcutaneous Q8H  . magnesium oxide  800 mg Oral Daily  . midodrine  5 mg Oral Q T,Th,Sa-HD  . multivitamin  1 tablet Oral QHS  . oxybutynin  5 mg Oral BID  . pantoprazole  40 mg Oral QHS  . sevelamer carbonate  2,400 mg Oral TID WC

## 2018-02-09 NOTE — Progress Notes (Signed)
PROGRESS NOTE   Albert Stanley  SFK:[id number]    DOB: [birth year]    DOA: 02/08/2018  PCP: [name], MD   I have briefly reviewed patients previous medical [facility] in [facility].  Brief Narrative:  72 year old male with PMH of ESRD on TTS HD, chronic hypotension on midodrine, DM type II, NICM EF 17%, chronic systolic CHF, SDH 01/9448 status post craniotomy, right femoral DVT status post IVC filter, urinary retention, bladder cancer status post resection, renal mass, recent hospitalization 02/03/2018- 02/07/2018 for refractory hypoglycemia, glipizide was switched to glyburide on discharge, presented to [facility] ED on 02/08/2018 with confusion and another episode of symptomatic hypoglycemia/CBG 39.  Started on D10 infusion and admitted for observation and evaluation.  Nephrology consulted for dialysis needs.   Assessment & Plan:   Principal Problem:   Hypoglycemia Active Problems:   Fluid overload   ESRD (end stage renal disease) on dialysis ([name])   Type 2 diabetes mellitus with peripheral neuropathy ([name])   Pressure injury of skin   Type II DM with recurrent hypoglycemia: History as indicated above.  Discontinue all oral hypoglycemics and likely should not be on any going forward.  If hypoglycemics are warranted, may consider NovoLog SSI.  Started on D10 IV infusion.  Hypoglycemia resolved and last 2 CBGs in the 151-164 range.  Encourage oral intake.  Reduce D10 from 50 to 20 mL an hour and if no further hypoglycemia then discontinue and monitor.  ESRD on TTS HD: Nephrology consulted for dialysis needs.  Chronic hypotension: Stable.  Remains on midodrine.  NICM/chronic systolic CHF: Volume management across HD.  Appears peripherally volume overloaded.  Patient was offered extra dialysis today by nephrology for volume but he declined as per nephrology.  TTE 04/05/2017: LVEF 25-30% and diffuse hypokinesis.  PVCs: Remains on amiodarone.  Anemia in ESRD: Stable.  Thrombocytopenia,  intermittent: Stable.  Secondary hyperparathyroidism: Per nephrology.  History of SDH status post craniotomy: Stable.  History of DVT status post IVC filter  Hyponatremia: Likely related to ESRD and HD needs.  Management per nephrology.  Hypomagnesemia: Replaced.  Bladder mass/chronic Foley: Changed monthly.  Chronic left knee pain: As per recent DC summary, not candidate for knee replacement, has not walked in the past year and a half and is wheelchair-bound.  Outpatient follow-up with orthopedics.     DVT prophylaxis: Subcutaneous heparin Code Status: Full Family Communication: None at bedside. Disposition: Wean off of D10 infusion and if no further hypoglycemic episodes, possible discharge 5/2   Consultants:  Nephrology  Procedures:  HD via left TDC  Antimicrobials:  None   Subjective: Denies complaints.  Hungry and wants more food.  States that he last took his diabetes pill on 4/29.  He did not report any pain to me.  As per RN, no acute issues noted.  ROS: As above  Objective:  Vitals:   02/09/18 0055 02/09/18 0403 02/09/18 0405 02/09/18 1341  BP: 117/80 116/64  135/60  Pulse: 72 70  72  Resp: 18 18  16   Temp: 98.1 F (36.7 C) 97.9 F (36.6 C)  98 F (36.7 C)  TempSrc: Oral   Oral  SpO2: 100% (!) 88% 100% 100%  Weight:        Examination:  General exam: Elderly male, moderately built and nourished lying comfortably propped up in bed. Respiratory system: Clear to auscultation. Respiratory effort normal. Cardiovascular system: S1 & S2 heard, RRR. No JVD, murmurs, rubs, gallops or clicks.  1+ pitting bilateral leg edema,  left upper extremity edema.  Trace right upper extremity edema.  Telemetry personally reviewed: Sinus rhythm with BBB morphology. Gastrointestinal system: Abdomen is nondistended, soft and nontender. No organomegaly or masses felt. Normal bowel sounds heard. Central nervous system: Alert and oriented. No focal neurological  deficits. Extremities: Symmetric 5 x 5 power. Skin: No rashes, lesions or ulcers Psychiatry: Judgement and insight appear normal. Mood & affect appropriate.     Data Reviewed: I have personally reviewed following labs and imaging studies  CBC: Recent Labs  Lab 02/03/18 1022 02/04/18 0114 02/04/18 0345 02/06/18 0703 02/08/18 1128 02/09/18 0439  WBC 5.7  --  5.3 6.6 5.8 5.6  NEUTROABS 4.0  --   --   --   --   --   HGB 8.5* 9.9* 8.2* 8.7* 9.4* 8.5*  HCT 25.1* 29.0* 24.3* 25.5* 26.6* 24.5*  MCV 89.0  --  87.4 87.9 87.5 85.4  PLT 135*  --  146* 144* 163 222*   Basic Metabolic Panel: Recent Labs  Lab 02/03/18 2051 02/04/18 0114 02/04/18 0345 02/04/18 1414 02/06/18 0703 02/08/18 1128 02/09/18 0439  NA 136 135 133*  --  125* 122* 127*  K 3.3* 3.5 3.5  --  4.1 4.8 3.5  CL 101 101 99*  --  90* 86* 91*  CO2 16*  --  17*  --  19* 15* 21*  GLUCOSE 82 38* 53*  --  118* 60* 100*  BUN 73* 63* 74*  --  62* 85* 51*  CREATININE 10.60* 10.90* 10.84*  --  8.74* 10.12* 7.16*  CALCIUM 7.5*  --  7.4*  --  7.5* 7.8* 7.4*  MG  --   --   --  1.5* 1.6* 1.9  --   PHOS  --   --  6.7*  --   --  7.1*  --    Liver Function Tests: Recent Labs  Lab 02/04/18 0345 02/06/18 0703 02/08/18 1128  AST 16 13* 28  ALT 13* 13* 16*  ALKPHOS 69 64 72  BILITOT 0.8 0.8 0.9  PROT 5.7* 5.7* 6.3*  ALBUMIN 2.7* 2.6* 2.8*   CBG: Recent Labs  Lab 02/09/18 0359 02/09/18 0640 02/09/18 0835 02/09/18 1134 02/09/18 1450  GLUCAP 82 108* 96 151* 164*    Recent Results (from the past 240 hour(s))  MRSA PCR Screening     Status: Abnormal   Collection Time: 02/04/18  3:33 AM  Result Value Ref Range Status   MRSA by PCR POSITIVE (A) NEGATIVE Final    Comment:        The GeneXpert MRSA Assay (FDA approved for NASAL specimens only), is one component of a comprehensive MRSA colonization surveillance program. It is not intended to diagnose MRSA infection nor to guide or monitor treatment for MRSA  infections. RESULT CALLED TO, READ BACK BY AND VERIFIED WITHEverlean Alstrom RN [phone] 02/04/18 A BROWNING Performed at [facility], [place] [address], [place], [place] [address]   MRSA PCR Screening     Status: Abnormal   Collection Time: 02/08/18  6:55 PM  Result Value Ref Range Status   MRSA by PCR POSITIVE (A) NEGATIVE Final    Comment:        The GeneXpert MRSA Assay (FDA approved for NASAL specimens only), is one component of a comprehensive MRSA colonization surveillance program. It is not intended to diagnose MRSA infection nor to guide or monitor treatment for MRSA infections. RESULT CALLED TO, READ BACK BY AND VERIFIED WITH: C LUCK  RN 02/09/18 0032 JDW Performed at [facility], [place] [address], [place], [place] [address]          Radiology Studies: No results found.      Scheduled Meds: . allopurinol  300 mg Oral Daily  . amiodarone  200 mg Oral Daily  . bacitracin  1 application Topical BID  . [START ON 02/10/2018] calcitRIOL  3.25 mcg Oral Q T,Th,Sa-HD  . diclofenac sodium  2 g Topical TID  . feeding supplement (PRO-STAT SUGAR FREE 64)  30 mL Oral BID  . heparin  5,000 Units Subcutaneous Q8H  . magnesium oxide  800 mg Oral Daily  . midodrine  5 mg Oral Q T,Th,Sa-HD  . multivitamin  1 tablet Oral QHS  . oxybutynin  5 mg Oral BID  . pantoprazole  40 mg Oral QHS  . sevelamer carbonate  2,400 mg Oral TID WC   Continuous Infusions: . dextrose 50 mL/hr at 02/09/18 0945  . dextrose       LOS: 0 days     Vernell Leep, MD, FACP, [facility]. [facility] Pager [phone]  If 7PM-7AM, please contact night-coverage [email or web address] Password [facility] 02/09/2018, 3:38 PM

## 2018-02-10 DIAGNOSIS — N186 End stage renal disease: Secondary | ICD-10-CM | POA: Diagnosis not present

## 2018-02-10 DIAGNOSIS — Z992 Dependence on renal dialysis: Secondary | ICD-10-CM | POA: Diagnosis not present

## 2018-02-10 DIAGNOSIS — E162 Hypoglycemia, unspecified: Secondary | ICD-10-CM | POA: Diagnosis not present

## 2018-02-10 LAB — CBC
HEMATOCRIT: 24.1 % — AB (ref 39.0–52.0)
HEMOGLOBIN: 8.4 g/dL — AB (ref 13.0–17.0)
MCH: 30 pg (ref 26.0–34.0)
MCHC: 34.9 g/dL (ref 30.0–36.0)
MCV: 86.1 fL (ref 78.0–100.0)
Platelets: 120 10*3/uL — ABNORMAL LOW (ref 150–400)
RBC: 2.8 MIL/uL — ABNORMAL LOW (ref 4.22–5.81)
RDW: 17.4 % — ABNORMAL HIGH (ref 11.5–15.5)
WBC: 5.1 10*3/uL (ref 4.0–10.5)

## 2018-02-10 LAB — GLUCOSE, CAPILLARY
GLUCOSE-CAPILLARY: 112 mg/dL — AB (ref 65–99)
GLUCOSE-CAPILLARY: 125 mg/dL — AB (ref 65–99)
GLUCOSE-CAPILLARY: 167 mg/dL — AB (ref 65–99)
GLUCOSE-CAPILLARY: 169 mg/dL — AB (ref 65–99)
Glucose-Capillary: 105 mg/dL — ABNORMAL HIGH (ref 65–99)
Glucose-Capillary: 88 mg/dL (ref 65–99)
Glucose-Capillary: 84 mg/dL (ref 65–99)

## 2018-02-10 LAB — RENAL FUNCTION PANEL
ANION GAP: 17 — AB (ref 5–15)
Albumin: 2.5 g/dL — ABNORMAL LOW (ref 3.5–5.0)
BUN: 61 mg/dL — ABNORMAL HIGH (ref 6–20)
CHLORIDE: 86 mmol/L — AB (ref 101–111)
CO2: 21 mmol/L — AB (ref 22–32)
Calcium: 7.5 mg/dL — ABNORMAL LOW (ref 8.9–10.3)
Creatinine, Ser: 8.13 mg/dL — ABNORMAL HIGH (ref 0.61–1.24)
GFR calc Af Amer: 7 mL/min — ABNORMAL LOW (ref >60)
GFR calc non Af Amer: 6 mL/min — ABNORMAL LOW (ref >60)
GLUCOSE: 97 mg/dL (ref 65–99)
POTASSIUM: 3.8 mmol/L (ref 3.5–5.1)
Phosphorus: 5.7 mg/dL — ABNORMAL HIGH (ref 2.5–4.6)
Sodium: 124 mmol/L — ABNORMAL LOW (ref 135–145)

## 2018-02-10 MED ORDER — LIDOCAINE HCL (PF) 1 % IJ SOLN: 5.0000 mL | INTRAMUSCULAR | Status: DC | PRN

## 2018-02-10 MED ORDER — OXYCODONE-ACETAMINOPHEN 5-325 MG PO TABS: 2.0000 | ORAL_TABLET | Freq: Once | ORAL | Status: DC

## 2018-02-10 MED ORDER — SODIUM CHLORIDE 0.9 % IV SOLN: 100.0000 mL | INTRAVENOUS | Status: DC | PRN

## 2018-02-10 MED ORDER — OXYCODONE-ACETAMINOPHEN 5-325 MG PO TABS
1.0000 | ORAL_TABLET | Freq: Once | ORAL | Status: AC
  Administered 2018-02-10: 1 via ORAL

## 2018-02-10 MED ORDER — OXYCODONE-ACETAMINOPHEN 5-325 MG PO TABS
ORAL_TABLET | ORAL | Status: AC
  Filled 2018-02-10: qty 1

## 2018-02-10 MED ORDER — HEPARIN SODIUM (PORCINE) 1000 UNIT/ML DIALYSIS: 1000.0000 [IU] | INTRAMUSCULAR | Status: DC | PRN

## 2018-02-10 MED ORDER — CALCITRIOL 0.5 MCG PO CAPS
ORAL_CAPSULE | ORAL | Status: AC
  Filled 2018-02-10: qty 6

## 2018-02-10 MED ORDER — MIDODRINE HCL 5 MG PO TABS
ORAL_TABLET | ORAL | Status: AC
  Filled 2018-02-10: qty 1

## 2018-02-10 MED ORDER — LIDOCAINE-PRILOCAINE 2.5-2.5 % EX CREA: 1.0000 "application " | TOPICAL_CREAM | CUTANEOUS | Status: DC | PRN

## 2018-02-10 MED ORDER — CALCITRIOL 0.25 MCG PO CAPS
ORAL_CAPSULE | ORAL | Status: AC
  Filled 2018-02-10: qty 1

## 2018-02-10 MED ORDER — OXYCODONE HCL 5 MG PO TABS
2.5000 mg | ORAL_TABLET | Freq: Once | ORAL | Status: AC
  Administered 2018-02-10: 2.5 mg via ORAL

## 2018-02-10 MED ORDER — OXYCODONE HCL 5 MG PO TABS
ORAL_TABLET | ORAL | Status: AC
  Filled 2018-02-10: qty 1

## 2018-02-10 MED ORDER — PENTAFLUOROPROP-TETRAFLUOROETH EX AERO: 1.0000 "application " | INHALATION_SPRAY | CUTANEOUS | Status: DC | PRN

## 2018-02-10 MED ORDER — DIPHENHYDRAMINE HCL 25 MG PO CAPS
ORAL_CAPSULE | ORAL | Status: AC
  Filled 2018-02-10: qty 1

## 2018-02-10 MED ORDER — ALTEPLASE 2 MG IJ SOLR: 2.0000 mg | Freq: Once | INTRAMUSCULAR | Status: DC | PRN

## 2018-02-10 NOTE — Progress Notes (Addendum)
[facility] Progress Note   Subjective:  Seen in HD unit. Net UF 4L today  Drowsy, groggy -got pain meds   Objective Vitals:   02/10/18 1000 02/10/18 1030 02/10/18 1100 02/10/18 1115  BP: 123/61 (!) 116/56 (!) 112/50 (!) 114/56  Pulse: 69 73 70 70  Resp: 18 17 20 14   Temp:      TempSrc:      SpO2:      Weight:       Physical Exam General: WNWD NAD Heart: RRR Lungs: grossly CTAB Abdomen: obese soft NT Extremities: improving LE edema down to 1-2+ w some wrinkles noted Dialysis Access: L IJ TDC dsg intact; LUE AVG +bruit    Dialysis Orders:  [facility] TTS 4h 400/800 88.5kg 2K/2Ca Profile 4 LUE AVG placed 12/15/17 (haven't used yet) L IJ TDC  No Heparin d/t SDH  Mircera 250mcg IV q 2 weeks (last 4/11)  Calcitriol 3.31mcg PO TIW  Assessment/Plan: 1. DM with refractory hypoglycemia - PO meds discontinued. CBG 100s this am  2. ESRD - TTS.  Continue on schedule  3. Anemia Hgb 8.4  Aranesp 200 dosed 4/26  4. MBD- Phos ^ Cont. Calcitirol/Renvela 5. Hypotension/volume - Chronic edema, on midodrine for hypotension. Tolerating volume removal 4L off today. Continue UF for volume  6. Hx SDH s/p craniotomy 7. Hx DVT s/p IVC filter 8. NICM/CHF  9. Debility/weakness - for SNF placement , vol excess sig better, still have a way to go but can be addressed in outpt setting; ok for dc from renal standpoint   Lynnda Child PA-C [place] Pager [phone] 02/10/2018,12:15 PM  LOS: 0 days   Pt seen, examined and agree w A/P as above.  Kelly Splinter MD [facility] pager [phone]   02/10/2018, 5:02 PM    Additional Objective Labs: Basic Metabolic Panel: Recent Labs  Lab 02/04/18 0345  02/08/18 1128 02/09/18 0439 02/10/18 0800  NA 133*   < > 122* 127* 124*  K 3.5   < > 4.8 3.5 3.8  CL 99*   < > 86* 91* 86*  CO2 17*   < > 15* 21* 21*  GLUCOSE 53*   < > 60* 100* 97  BUN 74*   < > 85* 51* 61*  CREATININE 10.84*   < > 10.12*  7.16* 8.13*  CALCIUM 7.4*   < > 7.8* 7.4* 7.5*  PHOS 6.7*  --  7.1*  --  5.7*   < > = values in this interval not displayed.   CBC: Recent Labs  Lab 02/04/18 0345 02/06/18 0703 02/08/18 1128 02/09/18 0439 02/10/18 0800  WBC 5.3 6.6 5.8 5.6 5.1  HGB 8.2* 8.7* 9.4* 8.5* 8.4*  HCT 24.3* 25.5* 26.6* 24.5* 24.1*  MCV 87.4 87.9 87.5 85.4 86.1  PLT 146* 144* 163 123* 120*   Blood Culture    Component Value Date/Time   SDES URINE, CATHETERIZED 06/26/2017 1652   SPECREQUEST NONE 06/26/2017 1652   CULT >=100,000 COLONIES/mL SERRATIA MARCESCENS (A) 06/26/2017 1652   REPTSTATUS 06/28/2017 FINAL 06/26/2017 1652    Cardiac Enzymes: No results for input(s): CKTOTAL, CKMB, CKMBINDEX, TROPONINI in the last 168 hours. CBG: Recent Labs  Lab 02/09/18 2248 02/10/18 0033 02/10/18 0209 02/10/18 0414 02/10/18 1155  GLUCAP 124* 125* 112* 105* 88   Iron Studies: No results for input(s): IRON, TIBC, TRANSFERRIN, FERRITIN in the last 72 hours. Lab Results  Component Value Date   INR 1.59 01/18/2018   INR 1.72 12/09/2017  INR 1.69 12/08/2017   Medications: . sodium chloride    . sodium chloride     . allopurinol  300 mg Oral Daily  . amiodarone  200 mg Oral Daily  . bacitracin  1 application Topical BID  . calcitRIOL  3.25 mcg Oral Q T,Th,Sa-HD  . diclofenac sodium  2 g Topical TID  . feeding supplement (PRO-STAT SUGAR FREE 64)  30 mL Oral BID  . heparin  5,000 Units Subcutaneous Q8H  . magnesium oxide  800 mg Oral Daily  . midodrine  5 mg Oral Q T,Th,Sa-HD  . multivitamin  1 tablet Oral QHS  . oxybutynin  5 mg Oral BID  . pantoprazole  40 mg Oral QHS  . sevelamer carbonate  2,400 mg Oral TID WC

## 2018-02-10 NOTE — Clinical Social Work Note (Signed)
Clinical Social Work Assessment  Patient Details  Name: Albert Stanley MRN: [id number] Date of Birth: Aug 23, 1946  Date of referral:  02/10/18               Reason for consult:  Facility Placement                Permission sought to share information with:  Facility Sport and exercise psychologist, Family Supports Permission granted to share information::  Yes, Verbal Permission Granted  Name::     [place]::  SNFs  Relationship::  Spouse  Contact Information:  [phone]  Housing/Transportation Living arrangements for the past 2 months:  [place], [place] of Information:  Patient, Spouse Patient Interpreter Needed:  None Criminal Activity/Legal Involvement Pertinent to Current Situation/Hospitalization:  No - Comment as needed Significant Relationships:  Spouse, Adult Children Lives with:  Spouse Do you feel safe going back to the place where you live?  No Need for family participation in patient care:  Yes (Comment)  Care giving concerns:  CSW received consult for possible SNF placement at time of discharge. Patient was in dialysis but CSW spoke with patient's spouse regarding request for SNF placement at time of discharge. Patient's spouse reported that patient's spouse is currently unable to care for patient at their home given patient's current physical needs and fall risk. Patient was recently discharged from [facility] and will soon be in Medicare copay days. Wife reports understanding and has been paying caregivers to come to their home but is unable to do so for much longer. Patient expressed understanding of PT recommendation and is agreeable to SNF placement at time of discharge. CSW to continue to follow and assist with discharge planning needs.   Social Worker assessment / plan:  CSW spoke with patient's spouse concerning possibility of rehab at [facility] before returning home.  Employment status:  Retired Office manager PT Recommendations:  Not assessed at this time Information / Referral to community resources:  [place]  Patient/Family's Response to care:  Patient's spouse recognizes need for rehab before returning home and is agreeable to a SNF in [place]. Patient's spouse reported preference for [facility]. She is willing to pay copay cost upfront if needed.   Patient/Family's Understanding of and Emotional Response to Diagnosis, Current Treatment, and Prognosis:  Patient/family is realistic regarding therapy needs and expressed being hopeful for SNF placement. Patient's spouse expressed understanding of CSW role and discharge process as well as medical condition. No questions/concerns about plan or treatment.    Emotional Assessment Appearance:  Appears stated age Attitude/Demeanor/Rapport:  Unable to Assess Affect (typically observed):  Unable to Assess Orientation:  Oriented to Self, Oriented to Situation, Oriented to Place, Oriented to  Time Alcohol / Substance use:  Not Applicable Psych involvement (Current and /or in the community):  No (Comment)  Discharge Needs  Concerns to be addressed:  Care Coordination Readmission within the last 30 days:  No Current discharge risk:  Dependent with Mobility Barriers to Discharge:  Continued Medical Work up   [employer], Major 02/10/2018, 12:42 PM

## 2018-02-10 NOTE — Progress Notes (Signed)
PROGRESS NOTE   Albert Stanley  DUK:[id number]    DOB: 01/25/1946    DOA: 02/08/2018  PCP: [name], MD   I have briefly reviewed patients previous medical records in [facility].  Brief Narrative:  72 year old male with PMH of ESRD on TTS HD, chronic hypotension on midodrine, DM type II, NICM EF 37%, chronic systolic CHF, SDH 03/2830 status post craniotomy, right femoral DVT status post IVC filter, urinary retention, bladder cancer status post resection, renal mass, recent hospitalization 02/03/2018- 02/07/2018 for refractory hypoglycemia, glipizide was switched to glyburide on discharge, presented to [facility] ED on 02/08/2018 with confusion and another episode of symptomatic hypoglycemia/CBG 39.  Started on D10 infusion and admitted for observation and evaluation.  Nephrology consulted for dialysis needs.   Assessment & Plan:   Principal Problem:   Hypoglycemia Active Problems:   Fluid overload   ESRD (end stage renal disease) on dialysis ([name])   Type 2 diabetes mellitus with peripheral neuropathy ([name])   Pressure injury of skin   Type II DM with recurrent hypoglycemia: History as indicated above.  Discontinue all oral hypoglycemics and likely should not be on any going forward.  If hypoglycemics are warranted, may consider NovoLog SSI.  Patient was started on a D10 IV infusion which was gradually reduced as his CBGs were monitored and he remained without hypoglycemia.  Tolerating diet.  Discontinued D10 infusion on 5/2 and continue to monitor CBGs closely.  Patient going to SNF at discharge and should be able to continue CBG monitoring closely there.  ESRD on TTS HD: Nephrology consulted for dialysis needs and seen at HD on 5/2.  Significantly volume overloaded especially in his lower extremities and left upper extremity.  Volume management across dialysis.  Chronic hypotension: Stable.  Remains on midodrine.  NICM/chronic systolic CHF: Volume management across HD.  Appears  peripherally volume overloaded.  TTE 04/05/2017: LVEF 25-30% and diffuse hypokinesis.  PVCs: Remains on amiodarone.  Anemia in ESRD: Stable.  Thrombocytopenia, intermittent: Stable.  Secondary hyperparathyroidism: Per nephrology.  History of SDH status post craniotomy: Stable.  History of DVT status post IVC filter: Not sure if some of his leg edema is related to the filter.  Hyponatremia: Likely related to ESRD and HD needs.  Management per nephrology.  Hypomagnesemia: Replaced.  Bladder mass/chronic Foley: Changed monthly.  Chronic left knee pain: As per recent DC summary, not candidate for knee replacement, has not walked in the past year and a half and is wheelchair-bound.  Outpatient follow-up with orthopedics.  Left side teeth/jaw pain: Poor dental hygiene.  No overt abnormality seen but could have underlying dental caries.  Recommend outpatient dentist evaluation.     DVT prophylaxis: Subcutaneous heparin Code Status: Full Family Communication: I discussed with spouse, updated care and answered questions. Disposition: DC to SNF possibly 5/3.  Social work Scientist, research (physical sciences).   Consultants:  Nephrology  Procedures:  HD via left TDC  Antimicrobials:  None   Subjective: Seen this morning at HD.  Reports pain left lower posterior teeth area/jaw area.  Denies chest pain.  States that this has been going on for some time now.  ROS: As above  Objective:  Vitals:   02/10/18 1100 02/10/18 1115 02/10/18 1142 02/10/18 1352  BP: (!) 112/50 (!) 114/56 (!) 104/50 135/77  Pulse: 70 70 70 84  Resp: 20 14 16 16   Temp:   98.4 F (36.9 C) 97.8 F (36.6 C)  TempSrc:   Oral Oral  SpO2:   98%  Weight:   95.8 kg (211 lb 3.2 oz)     Examination:  General exam: Elderly male, moderately built and nourished lying comfortably propped up in bed.  Undergoing HD this morning. Respiratory system: Clear to auscultation. Respiratory effort normal.  Stable. Cardiovascular system: S1 &  S2 heard, RRR. No JVD, murmurs, rubs, gallops or clicks.  1+ pitting bilateral leg edema, left upper extremity edema.  Trace right upper extremity edema.   Gastrointestinal system: Abdomen is nondistended, soft and nontender. No organomegaly or masses felt. Normal bowel sounds heard.  Stable. Central nervous system: Alert and oriented. No focal neurological deficits.  Stable. Extremities: Symmetric 5 x 5 power. Skin: No rashes, lesions or ulcers Psychiatry: Judgement and insight appear normal. Mood & affect appropriate.  ENT: Poor dental hygiene.  No overt acute abnormality noted for reason of patient's pain.    Data Reviewed: I have personally reviewed following labs and imaging studies  CBC: Recent Labs  Lab 02/04/18 0345 02/06/18 0703 02/08/18 1128 02/09/18 0439 02/10/18 0800  WBC 5.3 6.6 5.8 5.6 5.1  HGB 8.2* 8.7* 9.4* 8.5* 8.4*  HCT 24.3* 25.5* 26.6* 24.5* 24.1*  MCV 87.4 87.9 87.5 85.4 86.1  PLT 146* 144* 163 123* 962*   Basic Metabolic Panel: Recent Labs  Lab 02/04/18 0345 02/04/18 1414 02/06/18 0703 02/08/18 1128 02/09/18 0439 02/10/18 0800  NA 133*  --  125* 122* 127* 124*  K 3.5  --  4.1 4.8 3.5 3.8  CL 99*  --  90* 86* 91* 86*  CO2 17*  --  19* 15* 21* 21*  GLUCOSE 53*  --  118* 60* 100* 97  BUN 74*  --  62* 85* 51* 61*  CREATININE 10.84*  --  8.74* 10.12* 7.16* 8.13*  CALCIUM 7.4*  --  7.5* 7.8* 7.4* 7.5*  MG  --  1.5* 1.6* 1.9  --   --   PHOS 6.7*  --   --  7.1*  --  5.7*   Liver Function Tests: Recent Labs  Lab 02/04/18 0345 02/06/18 0703 02/08/18 1128 02/10/18 0800  AST 16 13* 28  --   ALT 13* 13* 16*  --   ALKPHOS 69 64 72  --   BILITOT 0.8 0.8 0.9  --   PROT 5.7* 5.7* 6.3*  --   ALBUMIN 2.7* 2.6* 2.8* 2.5*   CBG: Recent Labs  Lab 02/09/18 2248 02/10/18 0033 02/10/18 0209 02/10/18 0414 02/10/18 1155  GLUCAP 124* 125* 112* 105* 88    Recent Results (from the past 240 hour(s))  MRSA PCR Screening     Status: Abnormal   Collection  Time: 02/04/18  3:33 AM  Result Value Ref Range Status   MRSA by PCR POSITIVE (A) NEGATIVE Final    Comment:        The GeneXpert MRSA Assay (FDA approved for NASAL specimens only), is one component of a comprehensive MRSA colonization surveillance program. It is not intended to diagnose MRSA infection nor to guide or monitor treatment for MRSA infections. RESULT CALLED TO, READ BACK BY AND VERIFIED WITHEverlean Alstrom RN [phone] 02/04/18 A BROWNING Performed at [facility], [place] [address], [place], [place] [address]   MRSA PCR Screening     Status: Abnormal   Collection Time: 02/08/18  6:55 PM  Result Value Ref Range Status   MRSA by PCR POSITIVE (A) NEGATIVE Final    Comment:        The GeneXpert MRSA Assay (FDA approved for  NASAL specimens only), is one component of a comprehensive MRSA colonization surveillance program. It is not intended to diagnose MRSA infection nor to guide or monitor treatment for MRSA infections. RESULT CALLED TO, READ BACK BY AND VERIFIED WITH: C LUCK RN 02/09/18 0032 JDW Performed at [facility], [address] [place], [place] [address]          Radiology Studies: No results found.      Scheduled Meds: . allopurinol  300 mg Oral Daily  . amiodarone  200 mg Oral Daily  . bacitracin  1 application Topical BID  . calcitRIOL  3.25 mcg Oral Q T,Th,Sa-HD  . diclofenac sodium  2 g Topical TID  . feeding supplement (PRO-STAT SUGAR FREE 64)  30 mL Oral BID  . heparin  5,000 Units Subcutaneous Q8H  . magnesium oxide  800 mg Oral Daily  . midodrine  5 mg Oral Q T,Th,Sa-HD  . multivitamin  1 tablet Oral QHS  . oxybutynin  5 mg Oral BID  . pantoprazole  40 mg Oral QHS  . sevelamer carbonate  2,400 mg Oral TID WC   Continuous Infusions:    LOS: 0 days     Vernell Leep, MD, FACP, [facility]. [facility] Pager [phone]  If 7PM-7AM, please contact night-coverage [email or web address] Password [facility] 02/10/2018, 3:51 PM

## 2018-02-10 NOTE — NC FL2 (Signed)
[place] LEVEL OF CARE SCREENING TOOL     IDENTIFICATION  Patient Name: Albert Stanley Birthdate: 12-24-1945 Sex: male Admission Date (Current Location): 02/08/2018  [facility] and [place] Number:  Herbalist and Address:  [facility] [place], [place] [address] [place], [place] [address]      Provider Number: [id number]  Attending Physician Name and Address:  Modena Jansky, MD  Relative Name and Phone Number:  Bertram Millard, spouse, [phone]    Current Level of Care: Hospital Recommended Level of Care: [place] Prior Approval Number:    Date Approved/Denied:   PASRR Number: [id number]  Discharge Plan: SNF    Current Diagnoses: Patient Active Problem List   Diagnosis Date Noted  . Pressure injury of skin 02/09/2018  . Hypoglycemia secondary to sulfonylurea 02/04/2018  . Hypoglycemia 02/03/2018  . Hematuria 01/06/2018  . Labile blood pressure   . Essential hypertension   . Acute on chronic diastolic heart failure ([place])   . Type 2 diabetes mellitus with peripheral neuropathy ([name])   . Acute deep vein thrombosis (DVT) of femoral vein of right lower extremity ([place])   . Traumatic subdural hematoma ([place]) 12/16/2017  . ESRD (end stage renal disease) ([place])   . ESRD (end stage renal disease) on dialysis ([place])   . Dysphagia   . Acute blood loss anemia   . Anemia of chronic disease   . Chronic diastolic congestive heart failure ([place])   . Diabetes mellitus type 2 in nonobese ([name])   . Benign essential HTN   . Nonischemic cardiomyopathy ([place])   . Transaminitis   . Encounter for central line placement   . Endotracheally intubated   . Ventilator dependent ([place])   . Chronic renal impairment   . Chronic in-center hemodialysis status ([place])   . S/P craniotomy 12/05/2017  . Hyperkalemia 12/02/2017  . Malnutrition of moderate degree 12/18/2016  . PVC's (premature ventricular contractions)   . Anemia associated with chronic  renal failure   . End-stage renal disease on hemodialysis ([place]) 12/09/2016  . Dyslipidemia associated with type 2 diabetes mellitus ([place]) 12/09/2016  . Acute on chronic systolic and diastolic heart failure, NYHA class 1 ([place]) 12/09/2016  . Diabetes mellitus with diabetic nephropathy without long-term current use of insulin ([place]) 12/09/2016  . Type 2 diabetes mellitus ([place]) 12/11/2015  . Fluid overload 12/06/2015  . History of DVT (deep vein thrombosis) 12/04/2015  . Renal hematoma 12/04/2015  . H/O: gout 11/16/2013  . Trochanteric bursitis 10/17/2013  . 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 Lumbar degenerative disc disease 07/17/2013    Orientation RESPIRATION BLADDER Height & Weight     Self, Time, Situation, Place  Normal Continent, Indwelling catheter Weight: 95.8 kg (211 lb 3.2 oz) Height:     BEHAVIORAL SYMPTOMS/MOOD NEUROLOGICAL BOWEL NUTRITION STATUS      Continent Diet(Please see DC Summary)  AMBULATORY STATUS COMMUNICATION OF NEEDS Skin   Extensive Assist Verbally PU Stage and Appropriate Care(Stage II on sacrum with foam dressing)                       Personal Care Assistance Level of Assistance  Bathing, Feeding, Dressing Bathing Assistance: Maximum assistance Feeding assistance: Limited assistance Dressing Assistance: Maximum assistance     Functional Limitations Info             SPECIAL CARE FACTORS FREQUENCY  PT (By licensed PT), OT (By licensed OT)     PT  Frequency: 5x/week  OT Frequency: 3x/week            Contractures      Additional Factors Info  Code Status, Allergies, Isolation Precautions Code Status Info: Full Allergies Info: Meperidine, Penicillins, Sulfa Antibiotics, Sulfonamide Derivatives, Lac Bovis, Lactose Intolerance (Gi), Milk-related Compounds, Foltx Elfolate Plus, Betadine Povidone Iodine, Eggs Or Egg-derived Products     Isolation Precautions Info: MRSA in the nose     Current Medications (02/10/2018):  This is the current  hospital active medication list Current Facility-Administered Medications  Medication Dose Route Frequency Provider Last Rate Last Dose  . 0.9 %  sodium chloride infusion  100 mL Intravenous PRN Ejigiri, Thomos Lemons, PA-C      . 0.9 %  sodium chloride infusion  100 mL Intravenous PRN Ejigiri, Thomos Lemons, PA-C      . acetaminophen (TYLENOL) tablet 500 mg  500 mg Oral Q6H PRN Elwyn Reach, MD      . allopurinol (ZYLOPRIM) tablet 300 mg  300 mg Oral Daily Elwyn Reach, MD   300 mg at 02/09/18 0943  . alteplase (CATHFLO ACTIVASE) injection 2 mg  2 mg Intracatheter Once PRN Lynnda Child, PA-C      . amiodarone (PACERONE) tablet 200 mg  200 mg Oral Daily Gala Romney L, MD   200 mg at 02/09/18 0944  . bacitracin ointment 1 application  1 application Topical BID Elwyn Reach, MD   1 application at 25/85/27 2226  . calcitRIOL (ROCALTROL) capsule 3.25 mcg  3.25 mcg Oral Q T,Th,Sa-HD Lynnda Child, PA-C   3.25 mcg at 02/10/18 7824  . diclofenac sodium (VOLTAREN) 1 % transdermal gel 2 g  2 g Topical TID Elwyn Reach, MD   2 g at 02/09/18 2225  . diphenhydrAMINE (BENADRYL) capsule 25 mg  25 mg Oral Q6H PRN Elwyn Reach, MD   25 mg at 02/10/18 2353  . feeding supplement (PRO-STAT SUGAR FREE 64) liquid 30 mL  30 mL Oral BID Gala Romney L, MD   30 mL at 02/09/18 2224  . heparin injection 1,000 Units  1,000 Units Dialysis PRN Lynnda Child, PA-C      . heparin injection 5,000 Units  5,000 Units Subcutaneous Q8H Elwyn Reach, MD   5,000 Units at 02/09/18 2225  . lactulose (CHRONULAC) 10 GM/15ML solution 10 g  10 g Oral Daily PRN Gala Romney L, MD      . lidocaine (PF) (XYLOCAINE) 1 % injection 5 mL  5 mL Intradermal PRN Ejigiri, Thomos Lemons, PA-C      . lidocaine-prilocaine (EMLA) cream 1 application  1 application Topical PRN Ejigiri, Thomos Lemons, PA-C      . liver oil-zinc oxide (DESITIN) 40 % ointment 1 application  1 application Topical  Daily PRN [place], Mohammad L, MD      . magnesium oxide (MAG-OX) tablet 800 mg  800 mg Oral Daily Gala Romney L, MD   800 mg at 02/09/18 0944  . Melatonin TABS 6 mg  6 mg Oral QHS PRN Gala Romney L, MD      . midodrine (PROAMATINE) tablet 5 mg  5 mg Oral Q T,Th,Sa-HD Lynnda Child, PA-C   5 mg at 02/10/18 6144  . multivitamin (RENA-VIT) tablet 1 tablet  1 tablet Oral QHS Elwyn Reach, MD   1 tablet at 02/09/18 2224  . neomycin-bacitracin-polymyxin (NEOSPORIN) ointment 1 application  1 application Topical Daily PRN Elwyn Reach, MD      .  oxybutynin (DITROPAN) tablet 5 mg  5 mg Oral BID Elwyn Reach, MD   5 mg at 02/09/18 2224  . oxyCODONE-acetaminophen (PERCOCET) 7.5-325 MG per tablet 1 tablet  1 tablet Oral Q6H PRN Elwyn Reach, MD   1 tablet at 02/09/18 1727  . pantoprazole (PROTONIX) EC tablet 40 mg  40 mg Oral QHS Elwyn Reach, MD   40 mg at 02/09/18 2224  . pentafluoroprop-tetrafluoroeth (GEBAUERS) aerosol 1 application  1 application Topical PRN Ejigiri, Thomos Lemons, PA-C      . sevelamer carbonate (RENVELA) tablet 2,400 mg  2,400 mg Oral TID WC Lynnda Child, PA-C   2,400 mg at 02/09/18 1300     Discharge Medications: Please see discharge summary for a list of discharge medications.  Relevant Imaging Results:  Relevant Lab Results:   Additional Information SSN: [id number]  Pt gets HD T, Th, Sat at [facility] on [address] [address] [address]. and would need transportation.  Benard Halsted, LCSWA

## 2018-02-10 NOTE — Plan of Care (Signed)
Generalised edema espercialy on lt side of the body, dry crack feey

## 2018-02-11 DIAGNOSIS — E1142 Type 2 diabetes mellitus with diabetic polyneuropathy: Secondary | ICD-10-CM | POA: Diagnosis not present

## 2018-02-11 DIAGNOSIS — E162 Hypoglycemia, unspecified: Secondary | ICD-10-CM | POA: Diagnosis not present

## 2018-02-11 DIAGNOSIS — Z992 Dependence on renal dialysis: Secondary | ICD-10-CM | POA: Diagnosis not present

## 2018-02-11 DIAGNOSIS — N186 End stage renal disease: Secondary | ICD-10-CM | POA: Diagnosis not present

## 2018-02-11 LAB — GLUCOSE, CAPILLARY
GLUCOSE-CAPILLARY: 109 mg/dL — AB (ref 65–99)
GLUCOSE-CAPILLARY: 117 mg/dL — AB (ref 65–99)
GLUCOSE-CAPILLARY: 129 mg/dL — AB (ref 65–99)
Glucose-Capillary: 119 mg/dL — ABNORMAL HIGH (ref 65–99)
Glucose-Capillary: 126 mg/dL — ABNORMAL HIGH (ref 65–99)

## 2018-02-11 MED ORDER — SEVELAMER CARBONATE 800 MG PO TABS: 2400.0000 mg | ORAL_TABLET | Freq: Three times a day (TID) | ORAL | Status: AC

## 2018-02-11 MED ORDER — MIDODRINE HCL 5 MG PO TABS: 5.0000 mg | ORAL_TABLET | ORAL | Status: DC

## 2018-02-11 MED ORDER — OXYCODONE-ACETAMINOPHEN 7.5-325 MG PO TABS: 1.0000 | ORAL_TABLET | Freq: Four times a day (QID) | ORAL | 0 refills | Status: DC | PRN

## 2018-02-11 NOTE — Progress Notes (Addendum)
Patient will DC to: [facility] Anticipated DC date: 02/11/18 Family notified: Spouse Transport by: Corey Harold   Per MD patient ready for DC to [facility]. RN, patient, patient's family, and facility notified of DC. Discharge Summary sent to facility. RN given number for report [phone] Room 601). DC packet on chart, including script. Ambulance transport requested for patient.   CSW signing off.  Cedric Fishman, LCSW Clinical Social Worker [phone]

## 2018-02-11 NOTE — Discharge Instructions (Signed)
Please get your medications reviewed and adjusted by your Primary MD. ° °Please request your Primary MD to go over all Hospital Tests and Procedure/Radiological results at the follow up, please get all Hospital records sent to your Prim MD by signing hospital release before you go home. ° °If you had Pneumonia of Lung problems at the Hospital: °Please get a 2 view Chest X ray done in 6-8 weeks after hospital discharge or sooner if instructed by your Primary MD. ° °If you have Congestive Heart Failure: °Please call your Cardiologist or Primary MD anytime you have any of the following symptoms:  °1) 3 pound weight gain in 24 hours or 5 pounds in 1 week  °2) shortness of breath, with or without a dry hacking cough  °3) swelling in the hands, feet or stomach  °4) if you have to sleep on extra pillows at night in order to breathe ° °Follow cardiac low salt diet and 1.5 lit/day fluid restriction. ° °If you have diabetes °Accuchecks 4 times/day, Once in AM empty stomach and then before each meal. °Log in all results and show them to your primary doctor at your next visit. °If any glucose reading is under 80 or above 300 call your primary MD immediately. ° °If you have Seizure/Convulsions/Epilepsy: °Please do not drive, operate heavy machinery, participate in activities at heights or participate in high speed sports until you have seen by Primary MD or a Neurologist and advised to do so again. ° °If you had Gastrointestinal Bleeding: °Please ask your Primary MD to check a complete blood count within one week of discharge or at your next visit. Your endoscopic/colonoscopic biopsies that are pending at the time of discharge, will also need to followed by your Primary MD. ° °Get Medicines reviewed and adjusted. °Please take all your medications with you for your next visit with your Primary MD ° °Please request your Primary MD to go over all hospital tests and procedure/radiological results at the follow up, please ask your  Primary MD to get all Hospital records sent to his/her office. ° °If you experience worsening of your admission symptoms, develop shortness of breath, life threatening emergency, suicidal or homicidal thoughts you must seek medical attention immediately by calling 911 or calling your MD immediately  if symptoms less severe. ° °You must read complete instructions/literature along with all the possible adverse reactions/side effects for all the Medicines you take and that have been prescribed to you. Take any new Medicines after you have completely understood and accpet all the possible adverse reactions/side effects.  ° °Do not drive or operate heavy machinery when taking Pain medications.  ° °Do not take more than prescribed Pain, Sleep and Anxiety Medications ° °Special Instructions: If you have smoked or chewed Tobacco  in the last 2 yrs please stop smoking, stop any regular Alcohol  and or any Recreational drug use. ° °Wear Seat belts while driving. ° °Please note °You were cared for by a hospitalist during your hospital stay. If you have any questions about your discharge medications or the care you received while you were in the hospital after you are discharged, you can call the unit and asked to speak with the hospitalist on call if the hospitalist that took care of you is not available. Once you are discharged, your primary care physician will handle any further medical issues. Please note that NO REFILLS for any discharge medications will be authorized once you are discharged, as it is imperative that you   return to your primary care physician (or establish a relationship with a primary care physician if you do not have one) for your aftercare needs so that they can reassess your need for medications and monitor your lab values.  You can reach the hospitalist office at phone [phone] or fax [phone]   If you do not have a primary care physician, you can call [phone] for a physician  referral.   Hypoglycemia Hypoglycemia occurs when the level of sugar (glucose) in the blood is too low. Glucose is a type of sugar that provides the body's main source of energy. Certain hormones (insulin and glucagon) control the level of glucose in the blood. Insulin lowers blood glucose, and glucagon increases blood glucose. Hypoglycemia can result from having too much insulin in the bloodstream, or from not eating enough food that contains glucose. Hypoglycemia can happen in people who do or do not have diabetes. It can develop quickly, and it can be a medical emergency. What are the causes? Hypoglycemia occurs most often in people who have diabetes. If you have diabetes, hypoglycemia may be caused by:  Diabetes medicine.  Not eating enough, or not eating often enough.  Increased physical activity.  Drinking alcohol, especially when you have not eaten recently.  If you do not have diabetes, hypoglycemia may be caused by:  A tumor in the pancreas. The pancreas is the organ that makes insulin.  Not eating enough, or not eating for long periods at a time (fasting).  Severe infection or illness that affects the liver, heart, or kidneys.  Certain medicines.  You may also have reactive hypoglycemia. This condition causes hypoglycemia within 4 hours of eating a meal. This may occur after having stomach surgery. Sometimes, the cause of reactive hypoglycemia is not known. What increases the risk? Hypoglycemia is more likely to develop in:  People who have diabetes and take medicines to lower blood glucose.  People who abuse alcohol.  People who have a severe illness.  What are the signs or symptoms? Hypoglycemia may not cause any symptoms. If you have symptoms, they may include:  Hunger.  Anxiety.  Sweating and feeling clammy.  Confusion.  Dizziness or feeling light-headed.  Sleepiness.  Nausea.  Increased heart rate.  Headache.  Blurry  vision.  Seizure.  Nightmares.  Tingling or numbness around the mouth, lips, or tongue.  A change in speech.  Decreased ability to concentrate.  A change in coordination.  Restless sleep.  Tremors or shakes.  Fainting.  Irritability.  How is this diagnosed? Hypoglycemia is diagnosed with a blood test to measure your blood glucose level. This blood test is done while you are having symptoms. Your health care provider may also do a physical exam and review your medical history. If you do not have diabetes, other tests may be done to find the cause of your hypoglycemia. How is this treated? This condition can often be treated by immediately eating or drinking something that contains glucose, such as:  3-4 sugar tablets (glucose pills).  Glucose gel, 15-gram tube.  Fruit juice, 4 oz (120 mL).  Regular soda (not diet soda), 4 oz (120 mL).  Low-fat milk, 4 oz (120 mL).  Several pieces of hard candy.  Sugar or honey, 1 Tbsp.  Treating Hypoglycemia If You Have Diabetes  If you are alert and able to swallow safely, follow the 15:15 rule:  Take 15 grams of a rapid-acting carbohydrate. Rapid-acting options include: ? 1 tube of glucose gel. ? 3 glucose  pills. ? 6-8 pieces of hard candy. ? 4 oz (120 mL) of fruit juice. ? 4 oz (120 ml) of regular (not diet) soda.  Check your blood glucose 15 minutes after you take the carbohydrate.  If the repeat blood glucose level is still at or below 70 mg/dL (3.9 mmol/L), take 15 grams of a carbohydrate again.  If your blood glucose level does not increase above 70 mg/dL (3.9 mmol/L) after 3 tries, seek emergency medical care.  After your blood glucose level returns to normal, eat a meal or a snack within 1 hour.  Treating Severe Hypoglycemia Severe hypoglycemia is when your blood glucose level is at or below 54 mg/dL (3 mmol/L). Severe hypoglycemia is an emergency. Do not wait to see if the symptoms will go away. Get medical help  right away. Call your local emergency services (911 in the [place] Do not drive yourself to the hospital. If you have severe hypoglycemia and you cannot eat or drink, you may need an injection of glucagon. A family member or close friend should learn how to check your blood glucose and how to give you a glucagon injection. Ask your health care provider if you need to have an emergency glucagon injection kit available. Severe hypoglycemia may need to be treated in a hospital. The treatment may include getting glucose through an IV tube. You may also need treatment for the cause of your hypoglycemia. Follow these instructions at home: General instructions  Avoid any diets that cause you to not eat enough food. Talk with your health care provider before you start any new diet.  Take over-the-counter and prescription medicines only as told by your health care provider.  Limit alcohol intake to no more than 1 drink per day for nonpregnant women and 2 drinks per day for men. One drink equals 12 oz of beer, 5 oz of wine, or 1 oz of hard liquor.  Keep all follow-up visits as told by your health care provider. This is important. If You Have Diabetes:   Make sure you know the symptoms of hypoglycemia.  Always have a rapid-acting carbohydrate snack with you to treat low blood sugar.  Follow your diabetes management plan, as told by your health care provider. Make sure you: ? Take your medicines as directed. ? Follow your exercise plan. ? Follow your meal plan. Eat on time, and do not skip meals. ? Check your blood glucose as often as directed. Make sure to check your blood glucose before and after exercise. If you exercise longer or in a different way than usual, check your blood glucose more often. ? Follow your sick day plan whenever you cannot eat or drink normally. Make this plan in advance with your health care provider.  Share your diabetes management plan with people in your workplace, school,  and household.  Check your urine for ketones when you are ill and as told by your health care provider.  Carry a medical alert card or wear medical alert jewelry. If You Have Reactive Hypoglycemia or Low Blood Sugar From Other Causes:  Monitor your blood glucose as told by your health care provider.  Follow instructions from your health care provider about eating or drinking restrictions. Contact a health care provider if:  You have problems keeping your blood glucose in your target range.  You have frequent episodes of hypoglycemia. Get help right away if:  You continue to have hypoglycemia symptoms after eating or drinking something containing glucose.  Your blood glucose  is at or below 54 mg/dL (3 mmol/L).  You have a seizure.  You faint. These symptoms may represent a serious problem that is an emergency. Do not wait to see if the symptoms will go away. Get medical help right away. Call your local emergency services (911 in the [place] Do not drive yourself to the hospital. This information is not intended to replace advice given to you by your health care provider. Make sure you discuss any questions you have with your health care provider. Document Released: 09/28/2005 Document Revised: 03/11/2016 Document Reviewed: 11/01/2015 Elsevier Interactive Patient Education  [employer].

## 2018-02-11 NOTE — Clinical Social Work Placement (Signed)
   CLINICAL SOCIAL WORK PLACEMENT  NOTE  Date:  02/11/2018  Patient Details  Name: Albert Stanley MRN: [id number] Date of Birth: Jan 19, 1946  Clinical Social Work is seeking post-discharge placement for this patient at the [place] level of care (*CSW will initial, date and re-position this form in  chart as items are completed):  Yes   Patient/family provided with [facility] Work Department's list of facilities offering this level of care within the geographic area requested by the patient (or if unable, by the patient's family).  Yes   Patient/family informed of their freedom to choose among providers that offer the needed level of care, that participate in Medicare, Medicaid or managed care program needed by the patient, have an available bed and are willing to accept the patient.  Yes   Patient/family informed of [facility]'s ownership interest in [facility] and [facility], as well as of the fact that they are under no obligation to receive care at these facilities.  PASRR submitted to EDS on       PASRR number received on       Existing PASRR number confirmed on 02/10/18     FL2 transmitted to all facilities in geographic area requested by pt/family on 02/10/18     FL2 transmitted to all facilities within larger geographic area on       Patient informed that his/her managed care company has contracts with or will negotiate with certain facilities, including the following:        Yes   Patient/family informed of bed offers received.  Patient chooses bed at [facility]     Physician recommends and patient chooses bed at      Patient to be transferred to [facility] on 02/11/18.  Patient to be transferred to facility by [facility]     Patient family notified on 02/11/18 of transfer.  Name of family member notified:  Spouse     PHYSICIAN Please sign FL2     Additional Comment:     _______________________________________________ Benard Halsted, Leslie 02/11/2018, 2:47 PM

## 2018-02-11 NOTE — Progress Notes (Addendum)
[facility] Progress Note   Subjective:  No new c/os For discharge to SNF   Objective Vitals:   02/10/18 1352 02/10/18 2250 02/11/18 0456 02/11/18 1216  BP: 135/77 110/65 114/73 114/73  Pulse: 84 77 72 72  Resp: 16 12 12 12   Temp: 97.8 F (36.6 C) 98 F (36.7 C) 97.7 F (36.5 C) 97.7 F (36.5 C)  TempSrc: Oral Oral Oral Oral  SpO2:  100% 99%   Weight:    95.8 kg (211 lb 3.2 oz)  Height:    5\' 10"  (1.778 m)   Physical Exam General: WNWD NAD Heart: RRR Lungs: grossly CTAB Abdomen: obese soft NT Extremities: improving LE edema down to 1-2+ w some wrinkles noted Dialysis Access: L IJ TDC dsg intact; LUE AVG +bruit    Dialysis Orders:  [facility] TTS 4h 400/800 88.5kg 2K/2Ca Profile 4 LUE AVG placed 12/15/17 (haven't used yet) L IJ TDC  No Heparin d/t SDH  Mircera 281mcg IV q 2 weeks (last 4/11)  Calcitriol 3.37mcg PO TIW  Assessment/Plan: 1. DM with refractory hypoglycemia - PO meds discontinued. CBG 100s this am  2. ESRD - TTS.  Next HD 5/4 at outpatient center  3. Anemia Hgb 8.4  Aranesp 200 dosed 4/26  4. MBD- Phos ^ Cont. Calcitirol/Renvela 5. Hypotension/volume - Chronic edema, on midodrine for hypotension. Tolerating volume removal here,continue to titrate down as outpatient  6. Hx SDH s/p craniotomy 7. Hx DVT s/p IVC filter 8. NICM/CHF  9. Debility/weakness - for SNF placement , vol excess sig better, still have a way to go but can be addressed in outpt setting; ok for dc from renal standpoint   Lynnda Child PA-C [facility] Pager [phone] 02/11/2018,12:26 PM  LOS: 0 days   Pt seen, examined and agree w A/P as above.  Kelly Splinter MD [facility] pager [phone]   02/11/2018, 1:39 PM     Additional Objective Labs: Basic Metabolic Panel: Recent Labs  Lab 02/08/18 1128 02/09/18 0439 02/10/18 0800  NA 122* 127* 124*  K 4.8 3.5 3.8  CL 86* 91* 86*  CO2 15* 21* 21*  GLUCOSE 60* 100* 97  BUN 85*  51* 61*  CREATININE 10.12* 7.16* 8.13*  CALCIUM 7.8* 7.4* 7.5*  PHOS 7.1*  --  5.7*   CBC: Recent Labs  Lab 02/06/18 0703 02/08/18 1128 02/09/18 0439 02/10/18 0800  WBC 6.6 5.8 5.6 5.1  HGB 8.7* 9.4* 8.5* 8.4*  HCT 25.5* 26.6* 24.5* 24.1*  MCV 87.9 87.5 85.4 86.1  PLT 144* 163 123* 120*   Blood Culture    Component Value Date/Time   SDES URINE, CATHETERIZED 06/26/2017 1652   SPECREQUEST NONE 06/26/2017 1652   CULT >=100,000 COLONIES/mL SERRATIA MARCESCENS (A) 06/26/2017 1652   REPTSTATUS 06/28/2017 FINAL 06/26/2017 1652    Cardiac Enzymes: No results for input(s): CKTOTAL, CKMB, CKMBINDEX, TROPONINI in the last 168 hours. CBG: Recent Labs  Lab 02/10/18 1958 02/11/18 0016 02/11/18 0517 02/11/18 0803 02/11/18 1210  GLUCAP 167* 119* 109* 117* 129*   Iron Studies: No results for input(s): IRON, TIBC, TRANSFERRIN, FERRITIN in the last 72 hours. Lab Results  Component Value Date   INR 1.59 01/18/2018   INR 1.72 12/09/2017   INR 1.69 12/08/2017   Medications:  . allopurinol  300 mg Oral Daily  . amiodarone  200 mg Oral Daily  . bacitracin  1 application Topical BID  . calcitRIOL  3.25 mcg Oral Q T,Th,Sa-HD  . diclofenac sodium  2  g Topical TID  . feeding supplement (PRO-STAT SUGAR FREE 64)  30 mL Oral BID  . heparin  5,000 Units Subcutaneous Q8H  . magnesium oxide  800 mg Oral Daily  . midodrine  5 mg Oral Q T,Th,Sa-HD  . multivitamin  1 tablet Oral QHS  . oxybutynin  5 mg Oral BID  . pantoprazole  40 mg Oral QHS  . sevelamer carbonate  2,400 mg Oral TID WC

## 2018-02-11 NOTE — Discharge Summary (Signed)
Physician Discharge Summary  Albert Stanley UKG:[id number] DOB: 1946-09-06  PCP: [name], MD  Admit date: 02/08/2018 Discharge date: 02/11/2018  Recommendations for Outpatient Follow-up:  1. MD at SNF in 3 days.  Please follow labs (CBC & renal panel) that will be periodically drawn across dialysis. 2. Monitor CBGs closely 3 times daily AC and at bedtime and as needed.  If persistent significant hyperglycemia that needs treatment, consider sliding scale.  Recommend avoiding oral hypoglycemics due to recurrent hypoglycemia in the context of ESRD. 3. Recommend outpatient dentist consultation due to reported pain in left lower teeth/jaw. 4. Dr. [name], PCP upon discharge from SNF. 5. Hemodialysis Center: Continue scheduled hemodialysis on Tuesdays, Thursdays and Saturdays.  Next appointment should be on 02/12/2018.  Home Health: N/A Equipment/Devices: N/A    Discharge Condition: Improved and stable. CODE STATUS: Full. Diet recommendation: Heart healthy & diabetic diet.  Discharge Diagnoses:  Principal Problem:   Hypoglycemia Active Problems:   Fluid overload   ESRD (end stage renal disease) on dialysis ([name])   Type 2 diabetes mellitus with peripheral neuropathy ([name])   Pressure injury of skin   Brief Summary: 72 year old male with PMH of ESRD on TTS HD, chronic hypotension on midodrine, DM type II, NICM EF 76%, chronic systolic CHF, SDH 11/8313 status post craniotomy, right femoral DVT status post IVC filter, urinary retention, bladder cancer status post resection, renal mass, recent hospitalization 02/03/2018- 02/07/2018 for refractory hypoglycemia, glipizide was switched to glyburide on discharge, presented to [facility] ED on 02/08/2018 with confusion and another episode of symptomatic hypoglycemia/CBG 39.  Started on D10 infusion and admitted for observation and evaluation.  Nephrology consulted for dialysis needs.   Assessment & Plan:   Type II DM with recurrent hypoglycemia:  History as indicated above.  Discontinued all oral hypoglycemics and he should not be on any oral hypoglycemics going forward.  If hypoglycemics are warranted, may consider NovoLog SSI (probably when CBGs consistently >150).  Patient was started on a D10 IV infusion which was gradually reduced as his CBGs were monitored and he remained without hypoglycemia.  Tolerating diet.  Discontinued D10 infusion on 5/2.    Hypoglycemia resolved without recurrence >24 hours.  As per spouse, she is unable to manage his level of care at home and hence she requested SNF placement.  Last A1c 12/09/2016: 6.4.  May consider repeating A1c as outpatient.  ESRD on TTS HD: Nephrology consulted for dialysis needs and had HD on 5/2.  Significantly volume overloaded especially in his lower extremities and left upper extremity.  Volume management across dialysis.  Discussed with Dr. Melvia Heaps, cleared for discharge to SNF, continue adjusted dose of Renvela, newly started midodrine for chronic hypotension but no calcitriol which will be given at the dialysis center.  Chronic hypotension: Stable.  Started on midodrine on dialysis days.  NICM/chronic systolic CHF: Volume management across HD.  Appears peripherally volume overloaded.  TTE 04/05/2017: LVEF 25-30% and diffuse hypokinesis.  Extremity edema slightly improved.  PVCs: Remains on amiodarone.  Anemia in ESRD: Stable.  Periodically follow CBCs across HD.  Thrombocytopenia, intermittent: Stable.  Secondary hyperparathyroidism: Per nephrology.  Please see discussion above.  History of SDH status post craniotomy: Stable.  History of DVT status post IVC filter: Not sure if some of his leg edema is related to the filter.  Hyponatremia: Likely related to ESRD and HD needs.  Management per nephrology.  Hypomagnesemia: Replaced.  Discussed with nephrology and discontinued oral magnesium started during last discharge that he  had not started yet.  Bladder  mass/chronic Foley: Changed monthly.  As discussed with patient and nursing, unable to tell exact date that his catheter was changed.  Chronic left knee pain: As per recent DC summary, not candidate for knee replacement, has not walked in the past year and a half and is wheelchair-bound.  Outpatient follow-up with orthopedics.  Left side teeth/jaw pain: Poor dental hygiene.  No overt abnormality seen but could have underlying dental caries.  Recommend outpatient dentist evaluation.     Consultants:  Nephrology  Procedures:  HD via left [facility]   Discharge Instructions  Discharge Instructions    (HEART FAILURE PATIENTS) Call MD:  Anytime you have any of the following symptoms: 1) 3 pound weight gain in 24 hours or 5 pounds in 1 week 2) shortness of breath, with or without a dry hacking cough 3) swelling in the hands, feet or stomach 4) if you have to sleep on extra pillows at night in order to breathe.   Complete by:  As directed    Call MD for:   Complete by:  As directed    Recurrent hypoglycemia.   Call MD for:  difficulty breathing, headache or visual disturbances   Complete by:  As directed    Call MD for:  extreme fatigue   Complete by:  As directed    Call MD for:  persistant dizziness or light-headedness   Complete by:  As directed    Call MD for:  persistant nausea and vomiting   Complete by:  As directed    Call MD for:  severe uncontrolled pain   Complete by:  As directed    Call MD for:  temperature >100.4   Complete by:  As directed    Diet - low sodium heart healthy   Complete by:  As directed    Diet Carb Modified   Complete by:  As directed    Discharge instructions   Complete by:  As directed    1) monitor CBGs 3 times daily AC and at bedtime and as needed. 2) acetaminophen dose from all sources not to exceed 3 g/day.   Increase activity slowly   Complete by:  As directed        Medication List    STOP taking these medications   glyBURIDE 2.5 MG  tablet Commonly known as:  DIABETA   magnesium oxide 400 MG tablet Commonly known as:  MAG-OX     TAKE these medications   acetaminophen 500 MG tablet Commonly known as:  TYLENOL Take 500 mg by mouth every 6 (six) hours as needed for mild pain.   allopurinol 300 MG tablet Commonly known as:  ZYLOPRIM Take 300 mg by mouth daily.   amiodarone 200 MG tablet Commonly known as:  PACERONE Take 1 tablet (200 mg total) by mouth daily.   bacitracin ointment Apply 1 application topically 2 (two) times daily.   diclofenac sodium 1 % Gel Commonly known as:  VOLTAREN Apply 2 g topically 3 (three) times daily. Apply to lower back, left hip and knee   diphenhydrAMINE 25 MG tablet Commonly known as:  BENADRYL Take 25 mg by mouth every 6 (six) hours as needed for allergies.   feeding supplement (PRO-STAT SUGAR FREE 64) Liqd Take 30 mLs by mouth 2 (two) times daily.   lactulose 10 GM/15ML solution Commonly known as:  CHRONULAC Take 10 g by mouth daily as needed for mild constipation.   liver oil-zinc oxide 40 %  ointment Commonly known as:  DESITIN Apply 1 application topically daily as needed for irritation.   Melatonin 3 MG Tabs Take 6 mg by mouth at bedtime as needed (sleep).   midodrine 5 MG tablet Commonly known as:  PROAMATINE Take 1 tablet (5 mg total) by mouth Every Tuesday,Thursday,and Saturday with dialysis. Start taking on:  02/12/2018   multivitamin Tabs tablet Take 1 tablet by mouth at bedtime.   neomycin-bacitracin-polymyxin [id number] ointment Apply 1 application topically daily as needed (area on scalp).   oxybutynin 5 MG tablet Commonly known as:  DITROPAN Take 5 mg by mouth 2 (two) times daily.   oxyCODONE-acetaminophen 7.5-325 MG tablet Commonly known as:  PERCOCET Take 1 tablet by mouth every 6 (six) hours as needed for moderate pain.   pantoprazole 40 MG tablet Commonly known as:  PROTONIX Take 40 mg by mouth at bedtime.   sevelamer carbonate 800 MG  tablet Commonly known as:  RENVELA Take 3 tablets (2,400 mg total) by mouth 3 (three) times daily with meals. What changed:  how much to take      Follow-up Information    MD at SNF. Schedule an appointment as soon as possible for a visit in 3 day(s).   Why:  Follow labs (CBC & renal panel) that will be drawn across dialysis periodically.  Monitor CBGs closely and consider SSI if needed.  Recommend outpatient dentist consultation for evaluation of left-sided lower teeth/jaw pain.       [name], MD. Schedule an appointment as soon as possible for a visit.   Specialty:  Internal Medicine Why:  Upon discharge from SNF. Contact information: [address] [place] [address] [phone]        Hemodialysis Center Follow up.   Why:  Keep regular dialysis appointments on Tuesdays, Thursdays and Saturdays.  Next appointment on 02/12/2018.         Allergies  Allergen Reactions  . Meperidine Shortness Of Breath and Other (See Comments)    Increased heart rate Reaction to demerol  . Penicillins Other (See Comments)    "Knocks me out."   PATIENT HAS HAD A PCN REACTION WITH IMMEDIATE RASH, FACIAL/TONGUE/THROAT SWELLING, SOB, OR LIGHTHEADEDNESS WITH HYPOTENSION:  # # #  YES  >> SYNCOPE ### Has patient had a PCN reaction causing severe rash involving mucus membranes or skin necrosis: unknown PATIENT HAS HAD A PCN REACTION THAT REQUIRED TREATMENT AT MD'S OFFICE Has patient had a PCN reaction occurring within the last 10 years: no   . Sulfa Antibiotics Shortness Of Breath and Other (See Comments)    Increased heart rate  . Sulfonamide Derivatives Shortness Of Breath and Other (See Comments)    TACHYCARDIA  . [place] Diarrhea, Nausea And Vomiting and Other (See Comments)    Constipation Reaction to whole milk  . Lactose Intolerance (Gi) Diarrhea, Nausea And Vomiting and Other (See Comments)    Constipation Reaction to whole milk  . Milk-Related Compounds  Diarrhea, Nausea And Vomiting and Other (See Comments)    Constipation Reaction to whole milk  . Foltx [Elfolate Plus] Other (See Comments)    UNSPECIFIED REACTION  "Pt does not recall reaction to this"  . Betadine [Povidone Iodine] Itching, Rash and Other (See Comments)    burning  . Eggs Or Egg-Derived Products Nausea And Vomiting    Reaction to egg yolks - not whites        Procedures/Studies: No imaging studies performed this admission.   Subjective: Patient  denies complaints.  No toothache/jaw pain reported.  Denies dyspnea.  Patient interviewed and examined with his RN in room.  Patient states that his extremity swelling is slightly better.  He is aware that he is going to a skilled nursing facility for rehab.  Discharge Exam:  Vitals:   02/10/18 1352 02/10/18 2250 02/11/18 0456 02/11/18 1216  BP: 135/77 110/65 114/73 114/73  Pulse: 84 77 72 72  Resp: 16 12 12 12   Temp: 97.8 F (36.6 C) 98 F (36.7 C) 97.7 F (36.5 C) 97.7 F (36.5 C)  TempSrc: Oral Oral Oral Oral  SpO2:  100% 99%   Weight:    95.8 kg (211 lb 3.2 oz)  Height:    5\' 10"  (1.778 m)    General exam: Elderly male, moderately built and nourished lying comfortably propped up in bed.  Respiratory system: Clear to auscultation. Respiratory effort normal.   Cardiovascular system: S1 & S2 heard, RRR. No JVD, murmurs, rubs, gallops or clicks.  1+ pitting bilateral leg edema, left upper extremity edema.  Trace right upper extremity edema.    All extremity edema appears slightly improved compared to prior to dialysis yesterday.  Telemetry personally reviewed: SR with BBB morphology. Gastrointestinal system: Abdomen is nondistended, soft and nontender. No organomegaly or masses felt. Normal bowel sounds heard.  Central nervous system: Alert and oriented. No focal neurological deficits.  Extremities: Symmetric 5 x 5 power. Skin: No rashes, lesions or ulcers Psychiatry: Judgement and insight appear normal. Mood &  affect appropriate.  ENT: Poor dental hygiene.  No overt acute abnormality noted for reason of patient's pain.      The results of significant diagnostics from this hospitalization (including imaging, microbiology, ancillary and laboratory) are listed below for reference.     Microbiology: Recent Results (from the past 240 hour(s))  MRSA PCR Screening     Status: Abnormal   Collection Time: 02/04/18  3:33 AM  Result Value Ref Range Status   MRSA by PCR POSITIVE (A) NEGATIVE Final    Comment:        The GeneXpert MRSA Assay (FDA approved for NASAL specimens only), is one component of a comprehensive MRSA colonization surveillance program. It is not intended to diagnose MRSA infection nor to guide or monitor treatment for MRSA infections. RESULT CALLED TO, READ BACK BY AND VERIFIED WITHEverlean Alstrom RN [phone] 02/04/18 A BROWNING Performed at [facility], [place] [address], [place], [place] [address]   MRSA PCR Screening     Status: Abnormal   Collection Time: 02/08/18  6:55 PM  Result Value Ref Range Status   MRSA by PCR POSITIVE (A) NEGATIVE Final    Comment:        The GeneXpert MRSA Assay (FDA approved for NASAL specimens only), is one component of a comprehensive MRSA colonization surveillance program. It is not intended to diagnose MRSA infection nor to guide or monitor treatment for MRSA infections. RESULT CALLED TO, READ BACK BY AND VERIFIED WITH: C LUCK RN 02/09/18 0032 JDW Performed at [facility], [address], [place], [place] [address]      Labs: CBC: Recent Labs  Lab 02/06/18 0703 02/08/18 1128 02/09/18 0439 02/10/18 0800  WBC 6.6 5.8 5.6 5.1  HGB 8.7* 9.4* 8.5* 8.4*  HCT 25.5* 26.6* 24.5* 24.1*  MCV 87.9 87.5 85.4 86.1  PLT 144* 163 123* 366*   Basic Metabolic Panel: Recent Labs  Lab 02/04/18 1414 02/06/18 0703 02/08/18 1128 02/09/18 0439 02/10/18  0800  NA  --  125* 122* 127* 124*  K  --  4.1 4.8 3.5 3.8  CL  --  90* 86* 91*  86*  CO2  --  19* 15* 21* 21*  GLUCOSE  --  118* 60* 100* 97  BUN  --  62* 85* 51* 61*  CREATININE  --  8.74* 10.12* 7.16* 8.13*  CALCIUM  --  7.5* 7.8* 7.4* 7.5*  MG 1.5* 1.6* 1.9  --   --   PHOS  --   --  7.1*  --  5.7*   Liver Function Tests: Recent Labs  Lab 02/06/18 0703 02/08/18 1128 02/10/18 0800  AST 13* 28  --   ALT 13* 16*  --   ALKPHOS 64 72  --   BILITOT 0.8 0.9  --   PROT 5.7* 6.3*  --   ALBUMIN 2.6* 2.8* 2.5*   CBG: Recent Labs  Lab 02/10/18 1958 02/11/18 0016 02/11/18 0517 02/11/18 0803 02/11/18 1210  GLUCAP 167* 119* 109* 117* 129*       Time coordinating discharge: 45 minutes  SIGNED:  Vernell Leep, MD, FACP, [facility]. [facility] Pager [phone]  If 7PM-7AM, please contact night-coverage [email or web address] Password [email or web address] 02/11/2018, 12:20 PM

## 2018-02-11 NOTE — Progress Notes (Signed)
Pt discharged to [facility] instructions, prescriptions, and care notes sent with chart. IV was discontinued, no redness, pain, or swelling noted at this time. Telemetry discontinued and Centralized Telemetry was notified. Pt left the floor via stretcher in stable condition.

## 2018-02-11 NOTE — Evaluation (Signed)
Physical Therapy Evaluation Patient Details Name: Albert Stanley MRN: [id number] DOB: 17-Feb-1946 Today's Date: 02/11/2018   History of Present Illness  72 year old male with PMH of ESRD on TTS HD, chronic hypotension on midodrine, DM type II, NICM EF 93%, chronic systolic CHF, SDH 04/1695 status post craniotomy, right femoral DVT status post IVC filter, urinary retention, bladder cancer status post resection, renal mass, recent hospitalization 02/03/2018- 02/07/2018 for refractory hypoglycemia, glipizide was switched to glyburide on discharge, presented to [facility] ED on 02/08/2018 with confusion and another episode of symptomatic hypoglycemia/CBG 39.   Clinical Impression  PTA pt was able to perform sliding board transfers to his wheelchair with min A. Pt currently limited by increased R hip and L knee pain with weightbearing/movement and decreased strength. Pt currently mod A to sit EoB and maxA for management of LE back into bed. Pt unable to attain sit>stand due to increased LE pain with weightbearing and is modA for lateral scoot of 1 foot along side of bed before unable to continue due to increase in LE pain. PT recommends SNF level rehab at d/c to improve strength to be able to perform transfers into his wheelchair. PT will continue to follow acutely until d/c.     Follow Up Recommendations SNF    Equipment Recommendations  None recommended by PT       Precautions / Restrictions Precautions Precautions: Fall Restrictions Weight Bearing Restrictions: No      Mobility  Bed Mobility Overal bed mobility: Needs Assistance Bed Mobility: Supine to Sit;Sit to Supine     Supine to sit: Mod assist Sit to supine: Max assist   General bed mobility comments: pt able to initiate movement of R LE to EoB but required assist to clear bed surface and then was limited in movement by hip pain, L LE required assist for movement to EoB, pt able to bring trunk to upright with min A and HoB elevated.  Required maxA for management of LE into bed  Transfers     Transfers: Sit to/from Stand;Lateral/Scoot Transfers Sit to Stand: Total assist;+2 physical assistance        Lateral/Scoot Transfers: Mod assist General transfer comment: unable to attain sit to stand due to increased pain in bilateral LE with weightbearing, pt able to perform 2x lateral scoots towards HoB however unable to progress further secondary to increased LE pain and weakness  Ambulation/Gait             General Gait Details: unable      Balance Overall balance assessment: Needs assistance Sitting-balance support: No upper extremity supported;Feet supported Sitting balance-Leahy Scale: Fair                                       Pertinent Vitals/Pain Pain Assessment: 0-10 Pain Score: 9  Pain Location: R hip  and L knee with weightbearing  Pain Descriptors / Indicators: Aching;Grimacing;Guarding    Home Living Family/patient expects to be discharged to:: Skilled nursing facility                      Prior Function Level of Independence: Needs assistance   Gait / Transfers Assistance Needed: minimal assist for sliding board transfers. Mod I wheelchair mobility short distance. Dependent w/c mobility in community.  ADL's / Homemaking Assistance Needed: Wife assists with all ADLs other than feeding  Extremity/Trunk Assessment        Lower Extremity Assessment Lower Extremity Assessment: RLE deficits/detail;LLE deficits/detail RLE Deficits / Details: decreased hip and knee ROM,strength grossly assessed at 2+/5 during bed mobility RLE: Unable to fully assess due to pain LLE Deficits / Details: ROM and strength limited grossly assessed during movement at 2/5        Communication   Communication: No difficulties  Cognition Arousal/Alertness: Awake/alert Behavior During Therapy: WFL for tasks assessed/performed Overall Cognitive Status: Within Functional Limits  for tasks assessed                                               Assessment/Plan    PT Assessment Patient needs continued PT services  PT Problem List Decreased strength;Decreased range of motion;Decreased activity tolerance;Decreased mobility;Decreased balance;Pain       PT Treatment Interventions DME instruction;Functional mobility training;Therapeutic activities;Therapeutic exercise;Balance training;Patient/family education;Wheelchair mobility training    PT Goals (Current goals can be found in the Care Plan section)  Acute Rehab PT Goals Patient Stated Goal: to be able to get into w/c  PT Goal Formulation: With patient Time For Goal Achievement: 02/25/18 Potential to Achieve Goals: Fair    Frequency Min 2X/week    AM-PAC PT "6 Clicks" Daily Activity  Outcome Measure Difficulty turning over in bed (including adjusting bedclothes, sheets and blankets)?: A Lot Difficulty moving from lying on back to sitting on the side of the bed? : Unable Difficulty sitting down on and standing up from a chair with arms (e.g., wheelchair, bedside commode, etc,.)?: Unable Help needed moving to and from a bed to chair (including a wheelchair)?: Total Help needed walking in hospital room?: Total Help needed climbing 3-5 steps with a railing? : Total 6 Click Score: 7    End of Session Equipment Utilized During Treatment: Gait belt Activity Tolerance: Patient limited by pain Patient left: in bed;with call bell/phone within reach;with bed alarm set Nurse Communication: Mobility status PT Visit Diagnosis: Other abnormalities of gait and mobility (R26.89);Muscle weakness (generalized) (M62.81);Pain Pain - Right/Left: Right Pain - part of body: Hip    Time: 1152-1212 PT Time Calculation (min) (ACUTE ONLY): 20 min   Charges:   PT Evaluation $PT Eval Moderate Complexity: 1 Mod     PT G Codes:        Kaushik Maul B. Migdalia Dk PT, DPT Acute Rehabilitation  [phone] Pager [phone]    [place] 02/11/2018, 12:41 PM

## 2018-02-15 LAB — PROINSULIN/INSULIN RATIO
INSULIN: 7.5 u[IU]/mL
PROINSULIN: 41 pmol/L
Proinsulin/Insulin Ratio: 82 %

## 2018-02-21 ENCOUNTER — Inpatient Hospital Stay: Payer: Medicare Other | Admitting: Physical Medicine & Rehabilitation

## 2018-02-21 LAB — SULFONYLUREA HYPOGLYCEMICS PANEL, SERUM
ACETOHEXAMIDE: NEGATIVE ug/mL (ref 20–60)
Chlorpropamide: NEGATIVE ug/mL (ref 75–250)
GLIMEPIRIDE: NEGATIVE ng/mL (ref 80–250)
GLYBURIDE: NEGATIVE ng/mL
Glipizide: NEGATIVE ng/mL (ref 200–1000)
Nateglinide: NEGATIVE ng/mL
Repaglinide: NEGATIVE ng/mL
TOLAZAMIDE: NEGATIVE ug/mL
TOLBUTAMIDE: NEGATIVE ug/mL (ref 40–100)

## 2018-02-22 ENCOUNTER — Inpatient Hospital Stay: Payer: Medicare Other | Admitting: Physical Medicine & Rehabilitation

## 2018-02-26 ENCOUNTER — Emergency Department (HOSPITAL_COMMUNITY)
Admission: EM | Admit: 2018-02-26 | Discharge: 2018-02-26 | Disposition: A | Discharge status: Home / Self Care | Payer: Medicare Other | Attending: Emergency Medicine | Admitting: Emergency Medicine

## 2018-02-26 ENCOUNTER — Encounter (HOSPITAL_COMMUNITY): Payer: Self-pay | Admitting: Pharmacy Technician

## 2018-02-26 ENCOUNTER — Emergency Department (HOSPITAL_COMMUNITY): Payer: Medicare Other

## 2018-02-26 ENCOUNTER — Other Ambulatory Visit: Payer: Self-pay

## 2018-02-26 DIAGNOSIS — I132 Hypertensive heart and chronic kidney disease with heart failure and with stage 5 chronic kidney disease, or end stage renal disease: Secondary | ICD-10-CM | POA: Insufficient documentation

## 2018-02-26 DIAGNOSIS — Z992 Dependence on renal dialysis: Secondary | ICD-10-CM | POA: Insufficient documentation

## 2018-02-26 DIAGNOSIS — I5032 Chronic diastolic (congestive) heart failure: Secondary | ICD-10-CM | POA: Insufficient documentation

## 2018-02-26 DIAGNOSIS — I428 Other cardiomyopathies: Secondary | ICD-10-CM | POA: Diagnosis not present

## 2018-02-26 DIAGNOSIS — Z87891 Personal history of nicotine dependence: Secondary | ICD-10-CM | POA: Insufficient documentation

## 2018-02-26 DIAGNOSIS — N186 End stage renal disease: Secondary | ICD-10-CM | POA: Insufficient documentation

## 2018-02-26 DIAGNOSIS — E1122 Type 2 diabetes mellitus with diabetic chronic kidney disease: Secondary | ICD-10-CM | POA: Insufficient documentation

## 2018-02-26 DIAGNOSIS — J45909 Unspecified asthma, uncomplicated: Secondary | ICD-10-CM | POA: Insufficient documentation

## 2018-02-26 DIAGNOSIS — Z86718 Personal history of other venous thrombosis and embolism: Secondary | ICD-10-CM | POA: Insufficient documentation

## 2018-02-26 DIAGNOSIS — E785 Hyperlipidemia, unspecified: Secondary | ICD-10-CM | POA: Insufficient documentation

## 2018-02-26 DIAGNOSIS — Z79899 Other long term (current) drug therapy: Secondary | ICD-10-CM | POA: Insufficient documentation

## 2018-02-26 LAB — CBC WITH DIFFERENTIAL/PLATELET
Abs Immature Granulocytes: 0 10*3/uL (ref 0.0–0.1)
Basophils Absolute: 0 10*3/uL (ref 0.0–0.1)
Basophils Relative: 0 %
EOS PCT: 8 %
Eosinophils Absolute: 0.4 10*3/uL (ref 0.0–0.7)
HEMATOCRIT: 27.6 % — AB (ref 39.0–52.0)
HEMOGLOBIN: 9.4 g/dL — AB (ref 13.0–17.0)
Immature Granulocytes: 0 %
LYMPHS ABS: 0.7 10*3/uL (ref 0.7–4.0)
Lymphocytes Relative: 15 %
MCH: 28.7 pg (ref 26.0–34.0)
MCHC: 34.1 g/dL (ref 30.0–36.0)
MCV: 84.1 fL (ref 78.0–100.0)
Monocytes Absolute: 0.6 10*3/uL (ref 0.1–1.0)
Monocytes Relative: 14 %
NEUTROS PCT: 63 %
Neutro Abs: 2.8 10*3/uL (ref 1.7–7.7)
Platelets: 88 10*3/uL — ABNORMAL LOW (ref 150–400)
RBC: 3.28 MIL/uL — AB (ref 4.22–5.81)
RDW: 18.1 % — ABNORMAL HIGH (ref 11.5–15.5)
WBC: 4.5 10*3/uL (ref 4.0–10.5)

## 2018-02-26 LAB — BASIC METABOLIC PANEL
Anion gap: 16 — ABNORMAL HIGH (ref 5–15)
BUN: 86 mg/dL — AB (ref 6–20)
CHLORIDE: 95 mmol/L — AB (ref 101–111)
CO2: 23 mmol/L (ref 22–32)
CREATININE: 10.33 mg/dL — AB (ref 0.61–1.24)
Calcium: 8.8 mg/dL — ABNORMAL LOW (ref 8.9–10.3)
GFR calc Af Amer: 5 mL/min — ABNORMAL LOW (ref >60)
GFR calc non Af Amer: 4 mL/min — ABNORMAL LOW (ref >60)
GLUCOSE: 117 mg/dL — AB (ref 65–99)
POTASSIUM: 4.9 mmol/L (ref 3.5–5.1)
Sodium: 134 mmol/L — ABNORMAL LOW (ref 135–145)

## 2018-02-26 LAB — MAGNESIUM: MAGNESIUM: 2.2 mg/dL (ref 1.7–2.4)

## 2018-02-26 NOTE — ED Provider Notes (Signed)
[facility] EMERGENCY DEPARTMENT Provider Note   CSN: [id number] Arrival date & time: 02/26/18  1405     History   Chief Complaint No chief complaint on file.   HPI Albert Stanley is a 72 y.o. male.  Patient is a resident of [facility]  Patient is a dialysis patient for the past 2 years.  Normally dialyzed Tuesday Thursdays and Saturdays.  Patient last dialyzed on Tuesday.  His dialysis center is [facility].  Patient did not go to dialysis on Thursday or today because he was having difficulty with some diarrhea.  Had some vomiting earlier no vomiting for the past 2 days.  Diarrhea is may be 1-3 times a day.  No blood.  Not watery.  Room air sats upon arrival 100%.  Patient has a dialysis catheter in his left anterior chest.  Also has AV fistula left upper arm.  Patient without the complaint of shortness of breath.  No complaint of chest pain.     Past Medical History:  Diagnosis Date  . Arthritis    Osteoarthritis  . Asthma   . Bladder cancer ([place]) dx'd 1990   surg only  . CHF (congestive heart failure) ([place])   . Diabetes mellitus without complication ([place])   . ESRD (end stage renal disease) ([name])    Tues, Thurs, Sat dialysis  . GERD (gastroesophageal reflux disease)   . History of cardiac catheterization    a. LHC 5/17: no obstructive CAD  . History of DVT (deep vein thrombosis)    2003 ish  . History of nuclear stress test    a. Myoview 4/17: EF 39%, inf, inf-lat, apical inf, apical lat, apical scar, intermediate risk  . Hyperlipidemia   . Hypertension   . NICM (nonischemic cardiomyopathy) ([place])    a. Echo 2/17: mod LVH, EF 35-40%, inf-lat and inf-sept HK, mod MR, severe LAE, small pericardial effusion    Patient Active Problem List   Diagnosis Date Noted  . Pressure injury of skin 02/09/2018  . Hypoglycemia secondary to sulfonylurea 02/04/2018  . Hypoglycemia 02/03/2018  . Hematuria 01/06/2018  . Labile blood pressure   . Essential  hypertension   . Acute on chronic diastolic heart failure ([place])   . Type 2 diabetes mellitus with peripheral neuropathy ([name])   . Acute deep vein thrombosis (DVT) of femoral vein of right lower extremity ([place])   . Traumatic subdural hematoma ([place]) 12/16/2017  . ESRD (end stage renal disease) ([place])   . ESRD (end stage renal disease) on dialysis ([place])   . Dysphagia   . Acute blood loss anemia   . Anemia of chronic disease   . Chronic diastolic congestive heart failure ([place])   . Diabetes mellitus type 2 in nonobese ([name])   . Benign essential HTN   . Nonischemic cardiomyopathy ([place])   . Transaminitis   . Encounter for central line placement   . Endotracheally intubated   . Ventilator dependent ([place])   . Chronic renal impairment   . Chronic in-center hemodialysis status ([place])   . S/P craniotomy 12/05/2017  . Hyperkalemia 12/02/2017  . Malnutrition of moderate degree 12/18/2016  . PVC's (premature ventricular contractions)   . Anemia associated with chronic renal failure   . End-stage renal disease on hemodialysis ([place]) 12/09/2016  . Dyslipidemia associated with type 2 diabetes mellitus ([place]) 12/09/2016  . Acute on chronic systolic and diastolic heart failure, NYHA class 1 ([place]) 12/09/2016  . Diabetes mellitus with diabetic nephropathy without long-term  current use of insulin ([place]) 12/09/2016  . Type 2 diabetes mellitus ([place]) 12/11/2015  . Fluid overload 12/06/2015  . History of DVT (deep vein thrombosis) 12/04/2015  . Renal hematoma 12/04/2015  . H/O: gout 11/16/2013  . Trochanteric bursitis 10/17/2013  . 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 Lumbar degenerative disc disease 07/17/2013    Past Surgical History:  Procedure Laterality Date  . arthroscopic knee surgery Left   . AV FISTULA PLACEMENT Left 12/13/2015   Procedure: ARTERIOVENOUS (AV) FISTULA CREATION;  Surgeon: Angelia Mould, MD;  Location: [facility] OR;  Service: Vascular;  Laterality: Left;  . AV FISTULA PLACEMENT Left 12/15/2017    Procedure: INSERTION OF ARTERIOVENOUS (AV) GORE-TEX GRAFT ARM;  Surgeon: Serafina Mitchell, MD;  Location: [facility];  Service: Vascular;  Laterality: Left;  . BACK SURGERY     2 times  1960  . CARDIAC CATHETERIZATION N/A 02/28/2016   Procedure: Left Heart Cath and Coronary Angiography;  Surgeon: Larey Dresser, MD;  Location: [facility];  Service: Cardiovascular;  Laterality: N/A;  . CRANIOTOMY N/A 12/05/2017   Procedure: CRANIOTOMY HEMATOMA EVACUATION SUBDURAL;  Surgeon: Ashok Pall, MD;  Location: [facility];  Service: Neurosurgery;  Laterality: N/A;  . CYSTOSCOPY    . FISTULA SUPERFICIALIZATION Left 08/14/2016   Procedure: FISTULA SUPERFICIALIZATION LEFT UPPER ARM;  Surgeon: Angelia Mould, MD;  Location: [facility];  Service: Vascular;  Laterality: Left;  . INSERTION OF DIALYSIS CATHETER N/A 12/13/2015   Procedure: INSERTION OF DIALYSIS CATHETER;  Surgeon: Angelia Mould, MD;  Location: [facility];  Service: Vascular;  Laterality: N/A;  . INSERTION OF DIALYSIS CATHETER Left 05/07/2017   Procedure: INSERTION OF DIALYSIS CATHETER LEFT INTERNAL JUGULAR PLACEMENT;  Surgeon: Angelia Mould, MD;  Location: [facility];  Service: Vascular;  Laterality: Left;  . INSERTION OF DIALYSIS CATHETER Left 12/15/2017   Procedure: INSERTION OF DIALYSIS CATHETER;  Surgeon: Serafina Mitchell, MD;  Location: [facility] OR;  Service: Vascular;  Laterality: Left;  . IR IVC FILTER PLMT / S&I Burke Keels GUID/MOD SED  12/18/2017  . IR RADIOLOGIST EVAL & MGMT  05/12/2017  . JOINT REPLACEMENT Left    hip  . LIGATION OF COMPETING BRANCHES OF ARTERIOVENOUS FISTULA Left 08/14/2016   Procedure: LIGATION OF COMPETING BRANCHES OF ARTERIOVENOUS FISTULA LEFT UPPER ARM;  Surgeon: Angelia Mould, MD;  Location: [facility];  Service: Vascular;  Laterality: Left;  . PERIPHERAL VASCULAR CATHETERIZATION Left 06/09/2016   Procedure: Fistulagram;  Surgeon: Serafina Mitchell, MD;  Location: [facility];  Service: Cardiovascular;  Laterality:  Left;  ARM  . PERIPHERAL VASCULAR CATHETERIZATION Left 06/09/2016   Procedure: Peripheral Vascular Balloon Angioplasty;  Surgeon: Serafina Mitchell, MD;  Location: [facility];  Service: Cardiovascular;  Laterality: Left;  upper arm venous  . PERIPHERAL VASCULAR CATHETERIZATION Left 06/25/2016   Procedure: Fistulagram;  Surgeon: Serafina Mitchell, MD;  Location: [facility];  Service: Cardiovascular;  Laterality: Left;  . REVISON OF ARTERIOVENOUS FISTULA Left 05/07/2017   Procedure: REPAIR OF PSEUDOANEURYSM  LEFT BRACHIO-CEPHALIC  ARM ARTERIOVENOUS FISTULA;  Surgeon: Angelia Mould, MD;  Location: [facility];  Service: Vascular;  Laterality: Left;        Home Medications    Prior to Admission medications   Medication Sig Start Date End Date Taking? Authorizing Provider  acetaminophen (TYLENOL) 500 MG tablet Take 500 mg by mouth every 6 (six) hours as needed for mild pain.    [provider]  allopurinol (ZYLOPRIM) 300 MG tablet Take 300 mg by mouth daily.     [provider]  Amino Acids-Protein Hydrolys (FEEDING SUPPLEMENT, PRO-STAT SUGAR FREE 64,) LIQD Take 30 mLs by mouth 2 (two) times daily.    [provider]  amiodarone (PACERONE) 200 MG tablet Take 1 tablet (200 mg total) by mouth daily. 09/24/17   Larey Dresser, MD  bacitracin ointment Apply 1 application topically 2 (two) times daily. 01/18/18   Antonietta Breach, PA-C  diclofenac sodium (VOLTAREN) 1 % GEL Apply 2 g topically 3 (three) times daily. Apply to lower back, left hip and knee    [provider]  diphenhydrAMINE (BENADRYL) 25 MG tablet Take 25 mg by mouth every 6 (six) hours as needed for allergies.    [provider]  lactulose (CHRONULAC) 10 GM/15ML solution Take 10 g by mouth daily as needed for mild constipation.    [provider]  liver oil-zinc oxide (DESITIN) 40 % ointment Apply 1 application topically daily as needed for irritation.    [provider]  Melatonin 3 MG TABS Take 6 mg by mouth at bedtime as needed (sleep).     [provider]  midodrine (PROAMATINE) 5 MG tablet Take 1 tablet (5 mg total) by mouth Every Tuesday,Thursday,and Saturday with dialysis. 02/12/18   Hongalgi, Lenis Dickinson, MD  multivitamin (RENA-VIT) TABS tablet Take 1 tablet by mouth at bedtime.  12/20/15   [provider]  neomycin-bacitracin-polymyxin (NEOSPORIN) [phone] ointment Apply 1 application topically daily as needed (area on scalp).    [provider]  oxybutynin (DITROPAN) 5 MG tablet Take 5 mg by mouth 2 (two) times daily.    [provider]  oxyCODONE-acetaminophen (PERCOCET) 7.5-325 MG tablet Take 1 tablet by mouth every 6 (six) hours as needed for moderate pain. 02/11/18   Hongalgi, Lenis Dickinson, MD  pantoprazole (PROTONIX) 40 MG tablet Take 40 mg by mouth at bedtime.    [provider]  sevelamer carbonate (RENVELA) 800 MG tablet Take 3 tablets (2,400 mg total) by mouth 3 (three) times daily with meals. 02/11/18   Hongalgi, Lenis Dickinson, MD    Family History Family History  Problem Relation Age of Onset  . Arthritis Mother   . Cancer Father   . Heart disease Father   . Hypertension Sister   . Alcohol abuse Brother   . Diabetes Daughter   . Heart attack Paternal Uncle     Social History Social History   Tobacco Use  . Smoking status: Former Smoker    Years: 30.00    Last attempt to quit: 10/12/1998    Years since quitting: 19.3  . Smokeless tobacco: Never Used  Substance Use Topics  . Alcohol use: No    Alcohol/week: 0.0 oz    Comment: Used to drink on the weekends, quit 2 years ago  . Drug use: No     Allergies   Meperidine; Penicillins; Sulfa antibiotics; Sulfonamide derivatives; Lac bovis; Lactose intolerance (gi); Milk-related compounds; Foltx [elfolate plus]; Betadine [povidone iodine]; and Eggs or egg-derived products   Review of Systems Review of Systems  Constitutional: Negative for  fever.  HENT: Negative for congestion.   Eyes: Negative for redness.  Respiratory: Negative for shortness of breath.   Cardiovascular: Negative for chest pain.  Gastrointestinal: Negative for abdominal pain.  Musculoskeletal: Negative for myalgias.  Skin: Negative for rash.  Allergic/Immunologic: Positive for immunocompromised state.  Neurological: Negative for syncope and headaches.  Hematological: Bruises/bleeds easily.  Psychiatric/Behavioral: Negative for confusion.     Physical Exam Updated Vital Signs BP 131/73   Pulse 88   Temp 97.7 F (36.5 C) (Oral)   Resp 14   Ht 1.778 m (5\' 10" )   Wt 97.5 kg (215 lb)   SpO2 100%   BMI 30.85 kg/m   Physical Exam  Constitutional: He is oriented to person, place, and time. He appears well-developed and well-nourished. No distress.  HENT:  Head: Normocephalic and atraumatic.  Mucous membranes dry.  Eyes: Pupils are equal, round, and reactive to light. EOM are normal.  Neck: Neck supple.  Cardiovascular: Normal rate, regular rhythm and normal heart sounds.  Pulmonary/Chest: Effort normal and breath sounds normal. No respiratory distress. He has no wheezes. He has no rales.  Left anterior chest with dialysis catheter in place.  Abdominal: Soft. Bowel sounds are normal. There is no tenderness.  Musculoskeletal: Normal range of motion. He exhibits edema.  Left arm AV fistula with good thrill.  Neurological: He is alert and oriented to person, place, and time. No cranial nerve deficit or sensory deficit. He exhibits normal muscle tone. Coordination normal.  Some generalized weakness.  Skin: Skin is warm.  Nursing note and vitals reviewed.    ED Treatments / Results  Labs (all labs ordered are listed, but only abnormal results are displayed) Labs Reviewed  CBC WITH DIFFERENTIAL/PLATELET - Abnormal; Notable for the following components:      Result Value   RBC 3.28 (*)    Hemoglobin 9.4 (*)    HCT 27.6 (*)    RDW 18.1 (*)     Platelets 88 (*)    All other components within normal limits  BASIC METABOLIC PANEL - Abnormal; Notable for the following components:   Sodium 134 (*)    Chloride 95 (*)    Glucose, Bld 117 (*)    BUN 86 (*)    Creatinine, Ser 10.33 (*)    Calcium 8.8 (*)    GFR calc non Af Amer 4 (*)    GFR calc Af Amer 5 (*)    Anion gap 16 (*)    All other components within normal limits  MAGNESIUM    EKG EKG Interpretation  Date/Time:  Saturday Feb 26 2018 15:25:19 EDT Ventricular Rate:  85 PR Interval:    QRS Duration: 111 QT Interval:  403 QTC Calculation: 480 R Axis:   -32 Text Interpretation:  Sinus rhythm Multiple premature complexes, vent & supraven Anterior infarct, old Repol abnrm suggests ischemia, lateral leads ST elevation, consider inferior injury Confirmed by Fredia Sorrow [phone]) on 02/26/2018 3:37:14 PM   Radiology Dg Chest 2 View  Result Date: 02/26/2018 CLINICAL DATA:  Missing dialysis point.  Shortness of breath. EXAM: CHEST - 2 VIEW COMPARISON:  02/03/2018 and 12/24/2017 FINDINGS: Left IJ central venous catheter unchanged with tip over the cavoatrial junction. Lungs are adequately inflated without focal airspace consolidation or effusion. Moderate stable cardiomegaly. Remainder of the exam is unchanged. IMPRESSION: No acute cardiopulmonary disease. Moderate stable cardiomegaly. Left IJ dialysis catheter unchanged. Electronically Signed   By: Marin Olp M.D.   On: 02/26/2018 15:08    Procedures Procedures (including critical care time)  Medications Ordered in ED Medications - No data to display   Initial Impression / Assessment and Plan / ED Course  I have reviewed the triage vital signs and the nursing notes.  Pertinent labs & imaging results that were available during my care of the patient were reviewed by me and  considered in my medical decision making (see chart for details).    Patient's oxygen saturations are in the upper 90s on room air.  Chest x-ray  negative for pulmonary edema.  Labs without any hyperkalemia.  BUN and creatinine is elevated.  Patient does not meet criteria for emergent dialysis.  Will have patient return to [facility] and Have S. in place make arrangements on Monday for new be dialyzed that is normal center.  Patient will return for any new or worse symptoms.  Patient nontoxic no acute distress.   Final Clinical Impressions(s) / ED Diagnoses   Final diagnoses:  End-stage renal disease on hemodialysis [facility])    ED Discharge Orders    None       Fredia Sorrow, MD 02/26/18 [phone]

## 2018-02-26 NOTE — ED Notes (Signed)
ED Provider at bedside. 

## 2018-02-26 NOTE — Discharge Instructions (Signed)
Probably will need dialysis on Monday.  Patient will need to have arrangements made on Monday morning to go to his dialysis center sometime Monday at [facility] to have dialysis done.  Return for any new or worse symptoms.  Patient did not meet criteria today for emergent dialysis.

## 2018-02-26 NOTE — ED Notes (Signed)
[facility] contacted to transport patient to [facility] 

## 2018-02-26 NOTE — ED Triage Notes (Signed)
Arrives via ems from [facility] place with reports of missing dialysis X2. Pt refused to go and now wants to be diuresed. Pt with pitting edema to BLE. Pt VSS with EMS. EKG unremarkable BP 132/80, HR 82, RR 20, 97% RA, CBG 172. Pt with foley in place as well as central line.

## 2018-03-08 ENCOUNTER — Inpatient Hospital Stay (HOSPITAL_COMMUNITY): Payer: Medicare Other

## 2018-03-08 ENCOUNTER — Encounter (HOSPITAL_COMMUNITY): Payer: Self-pay | Admitting: Emergency Medicine

## 2018-03-08 ENCOUNTER — Emergency Department (HOSPITAL_COMMUNITY): Payer: Medicare Other

## 2018-03-08 ENCOUNTER — Inpatient Hospital Stay (HOSPITAL_COMMUNITY)
Admission: EM | Admit: 2018-03-08 | Discharge: 2018-03-17 | DRG: 871 | Disposition: A | Discharge status: Hospice | Payer: Medicare Other | Attending: Internal Medicine | Admitting: Internal Medicine

## 2018-03-08 DIAGNOSIS — Z9889 Other specified postprocedural states: Secondary | ICD-10-CM

## 2018-03-08 DIAGNOSIS — R339 Retention of urine, unspecified: Secondary | ICD-10-CM | POA: Diagnosis present

## 2018-03-08 DIAGNOSIS — E669 Obesity, unspecified: Secondary | ICD-10-CM | POA: Diagnosis present

## 2018-03-08 DIAGNOSIS — R627 Adult failure to thrive: Secondary | ICD-10-CM | POA: Diagnosis not present

## 2018-03-08 DIAGNOSIS — N186 End stage renal disease: Secondary | ICD-10-CM | POA: Diagnosis present

## 2018-03-08 DIAGNOSIS — E785 Hyperlipidemia, unspecified: Secondary | ICD-10-CM | POA: Diagnosis present

## 2018-03-08 DIAGNOSIS — G92 Toxic encephalopathy: Secondary | ICD-10-CM | POA: Diagnosis present

## 2018-03-08 DIAGNOSIS — I480 Paroxysmal atrial fibrillation: Secondary | ICD-10-CM | POA: Diagnosis not present

## 2018-03-08 DIAGNOSIS — Z86718 Personal history of other venous thrombosis and embolism: Secondary | ICD-10-CM | POA: Diagnosis not present

## 2018-03-08 DIAGNOSIS — I5042 Chronic combined systolic (congestive) and diastolic (congestive) heart failure: Secondary | ICD-10-CM | POA: Diagnosis present

## 2018-03-08 DIAGNOSIS — I493 Ventricular premature depolarization: Secondary | ICD-10-CM | POA: Diagnosis present

## 2018-03-08 DIAGNOSIS — Z882 Allergy status to sulfonamides status: Secondary | ICD-10-CM | POA: Diagnosis not present

## 2018-03-08 DIAGNOSIS — K219 Gastro-esophageal reflux disease without esophagitis: Secondary | ICD-10-CM | POA: Diagnosis present

## 2018-03-08 DIAGNOSIS — I9589 Other hypotension: Secondary | ICD-10-CM | POA: Diagnosis present

## 2018-03-08 DIAGNOSIS — Z1629 Resistance to other single specified antibiotic: Secondary | ICD-10-CM | POA: Diagnosis present

## 2018-03-08 DIAGNOSIS — M545 Low back pain, unspecified: Secondary | ICD-10-CM | POA: Insufficient documentation

## 2018-03-08 DIAGNOSIS — Z515 Encounter for palliative care: Secondary | ICD-10-CM | POA: Diagnosis present

## 2018-03-08 DIAGNOSIS — M5136 Other intervertebral disc degeneration, lumbar region: Secondary | ICD-10-CM | POA: Diagnosis present

## 2018-03-08 DIAGNOSIS — R197 Diarrhea, unspecified: Secondary | ICD-10-CM | POA: Diagnosis present

## 2018-03-08 DIAGNOSIS — D696 Thrombocytopenia, unspecified: Secondary | ICD-10-CM

## 2018-03-08 DIAGNOSIS — T839XXA Unspecified complication of genitourinary prosthetic device, implant and graft, initial encounter: Secondary | ICD-10-CM | POA: Diagnosis present

## 2018-03-08 DIAGNOSIS — T839XXD Unspecified complication of genitourinary prosthetic device, implant and graft, subsequent encounter: Secondary | ICD-10-CM | POA: Diagnosis not present

## 2018-03-08 DIAGNOSIS — N2581 Secondary hyperparathyroidism of renal origin: Secondary | ICD-10-CM | POA: Diagnosis not present

## 2018-03-08 DIAGNOSIS — R6520 Severe sepsis without septic shock: Secondary | ICD-10-CM

## 2018-03-08 DIAGNOSIS — Z87891 Personal history of nicotine dependence: Secondary | ICD-10-CM | POA: Diagnosis not present

## 2018-03-08 DIAGNOSIS — E11649 Type 2 diabetes mellitus with hypoglycemia without coma: Secondary | ICD-10-CM | POA: Diagnosis present

## 2018-03-08 DIAGNOSIS — Z91012 Allergy to eggs: Secondary | ICD-10-CM | POA: Diagnosis not present

## 2018-03-08 DIAGNOSIS — Z96642 Presence of left artificial hip joint: Secondary | ICD-10-CM | POA: Diagnosis present

## 2018-03-08 DIAGNOSIS — D689 Coagulation defect, unspecified: Secondary | ICD-10-CM | POA: Diagnosis not present

## 2018-03-08 DIAGNOSIS — E1122 Type 2 diabetes mellitus with diabetic chronic kidney disease: Secondary | ICD-10-CM | POA: Diagnosis present

## 2018-03-08 DIAGNOSIS — Z66 Do not resuscitate: Secondary | ICD-10-CM | POA: Diagnosis present

## 2018-03-08 DIAGNOSIS — Z888 Allergy status to other drugs, medicaments and biological substances status: Secondary | ICD-10-CM

## 2018-03-08 DIAGNOSIS — Z6827 Body mass index (BMI) 27.0-27.9, adult: Secondary | ICD-10-CM

## 2018-03-08 DIAGNOSIS — I10 Essential (primary) hypertension: Secondary | ICD-10-CM | POA: Diagnosis present

## 2018-03-08 DIAGNOSIS — R1310 Dysphagia, unspecified: Secondary | ICD-10-CM | POA: Diagnosis not present

## 2018-03-08 DIAGNOSIS — E739 Lactose intolerance, unspecified: Secondary | ICD-10-CM | POA: Diagnosis present

## 2018-03-08 DIAGNOSIS — M109 Gout, unspecified: Secondary | ICD-10-CM | POA: Insufficient documentation

## 2018-03-08 DIAGNOSIS — I132 Hypertensive heart and chronic kidney disease with heart failure and with stage 5 chronic kidney disease, or end stage renal disease: Secondary | ICD-10-CM | POA: Diagnosis present

## 2018-03-08 DIAGNOSIS — Z95828 Presence of other vascular implants and grafts: Secondary | ICD-10-CM

## 2018-03-08 DIAGNOSIS — G9341 Metabolic encephalopathy: Secondary | ICD-10-CM

## 2018-03-08 DIAGNOSIS — Z79899 Other long term (current) drug therapy: Secondary | ICD-10-CM

## 2018-03-08 DIAGNOSIS — D631 Anemia in chronic kidney disease: Secondary | ICD-10-CM | POA: Diagnosis present

## 2018-03-08 DIAGNOSIS — G8929 Other chronic pain: Secondary | ICD-10-CM | POA: Insufficient documentation

## 2018-03-08 DIAGNOSIS — M1990 Unspecified osteoarthritis, unspecified site: Secondary | ICD-10-CM | POA: Diagnosis present

## 2018-03-08 DIAGNOSIS — N189 Chronic kidney disease, unspecified: Secondary | ICD-10-CM

## 2018-03-08 DIAGNOSIS — B951 Streptococcus, group B, as the cause of diseases classified elsewhere: Secondary | ICD-10-CM | POA: Diagnosis not present

## 2018-03-08 DIAGNOSIS — E1121 Type 2 diabetes mellitus with diabetic nephropathy: Secondary | ICD-10-CM | POA: Diagnosis present

## 2018-03-08 DIAGNOSIS — Z8249 Family history of ischemic heart disease and other diseases of the circulatory system: Secondary | ICD-10-CM

## 2018-03-08 DIAGNOSIS — Z7189 Other specified counseling: Secondary | ICD-10-CM

## 2018-03-08 DIAGNOSIS — N400 Enlarged prostate without lower urinary tract symptoms: Secondary | ICD-10-CM | POA: Insufficient documentation

## 2018-03-08 DIAGNOSIS — A419 Sepsis, unspecified organism: Secondary | ICD-10-CM | POA: Diagnosis present

## 2018-03-08 DIAGNOSIS — R7881 Bacteremia: Secondary | ICD-10-CM

## 2018-03-08 DIAGNOSIS — R17 Unspecified jaundice: Secondary | ICD-10-CM | POA: Diagnosis present

## 2018-03-08 DIAGNOSIS — Z91011 Allergy to milk products: Secondary | ICD-10-CM | POA: Diagnosis not present

## 2018-03-08 DIAGNOSIS — E1151 Type 2 diabetes mellitus with diabetic peripheral angiopathy without gangrene: Secondary | ICD-10-CM | POA: Diagnosis present

## 2018-03-08 DIAGNOSIS — I429 Cardiomyopathy, unspecified: Secondary | ICD-10-CM | POA: Diagnosis present

## 2018-03-08 DIAGNOSIS — Z880 Allergy status to penicillin: Secondary | ICD-10-CM | POA: Diagnosis not present

## 2018-03-08 DIAGNOSIS — I499 Cardiac arrhythmia, unspecified: Secondary | ICD-10-CM | POA: Insufficient documentation

## 2018-03-08 DIAGNOSIS — T82590A Other mechanical complication of surgically created arteriovenous fistula, initial encounter: Secondary | ICD-10-CM | POA: Diagnosis not present

## 2018-03-08 DIAGNOSIS — A401 Sepsis due to streptococcus, group B: Principal | ICD-10-CM | POA: Diagnosis present

## 2018-03-08 DIAGNOSIS — Z91018 Allergy to other foods: Secondary | ICD-10-CM

## 2018-03-08 DIAGNOSIS — E1169 Type 2 diabetes mellitus with other specified complication: Secondary | ICD-10-CM | POA: Diagnosis present

## 2018-03-08 DIAGNOSIS — E440 Moderate protein-calorie malnutrition: Secondary | ICD-10-CM | POA: Diagnosis present

## 2018-03-08 DIAGNOSIS — I82409 Acute embolism and thrombosis of unspecified deep veins of unspecified lower extremity: Secondary | ICD-10-CM | POA: Insufficient documentation

## 2018-03-08 DIAGNOSIS — Z992 Dependence on renal dialysis: Secondary | ICD-10-CM | POA: Diagnosis not present

## 2018-03-08 DIAGNOSIS — M79604 Pain in right leg: Secondary | ICD-10-CM

## 2018-03-08 DIAGNOSIS — Z8551 Personal history of malignant neoplasm of bladder: Secondary | ICD-10-CM

## 2018-03-08 DIAGNOSIS — C679 Malignant neoplasm of bladder, unspecified: Secondary | ICD-10-CM | POA: Insufficient documentation

## 2018-03-08 DIAGNOSIS — T827XXA Infection and inflammatory reaction due to other cardiac and vascular devices, implants and grafts, initial encounter: Secondary | ICD-10-CM | POA: Diagnosis not present

## 2018-03-08 DIAGNOSIS — Y828 Other medical devices associated with adverse incidents: Secondary | ICD-10-CM | POA: Diagnosis not present

## 2018-03-08 LAB — I-STAT VENOUS BLOOD GAS, ED
Acid-Base Excess: 1 mmol/L (ref 0.0–2.0)
BICARBONATE: 27 mmol/L (ref 20.0–28.0)
O2 SAT: 64 %
PCO2 VEN: 45.6 mmHg (ref 44.0–60.0)
TCO2: 28 mmol/L (ref 22–32)
pH, Ven: 7.381 (ref 7.250–7.430)
pO2, Ven: 34 mmHg (ref 32.0–45.0)

## 2018-03-08 LAB — COMPREHENSIVE METABOLIC PANEL
ALBUMIN: 2.3 g/dL — AB (ref 3.5–5.0)
ALK PHOS: 72 U/L (ref 38–126)
ALT: 18 U/L (ref 17–63)
ANION GAP: 18 — AB (ref 5–15)
AST: 31 U/L (ref 15–41)
BUN: 63 mg/dL — ABNORMAL HIGH (ref 6–20)
CALCIUM: 9.3 mg/dL (ref 8.9–10.3)
CHLORIDE: 92 mmol/L — AB (ref 101–111)
CO2: 24 mmol/L (ref 22–32)
Creatinine, Ser: 8.01 mg/dL — ABNORMAL HIGH (ref 0.61–1.24)
GFR calc non Af Amer: 6 mL/min — ABNORMAL LOW (ref >60)
GFR, EST AFRICAN AMERICAN: 7 mL/min — AB (ref >60)
GLUCOSE: 140 mg/dL — AB (ref 65–99)
Potassium: 4.4 mmol/L (ref 3.5–5.1)
Sodium: 134 mmol/L — ABNORMAL LOW (ref 135–145)
Total Bilirubin: 2.9 mg/dL — ABNORMAL HIGH (ref 0.3–1.2)
Total Protein: 5.5 g/dL — ABNORMAL LOW (ref 6.5–8.1)

## 2018-03-08 LAB — CBG MONITORING, ED: GLUCOSE-CAPILLARY: 145 mg/dL — AB (ref 65–99)

## 2018-03-08 LAB — CBC WITH DIFFERENTIAL/PLATELET
BASOS ABS: 0 10*3/uL (ref 0.0–0.1)
Basophils Relative: 0 %
EOS ABS: 0.3 10*3/uL (ref 0.0–0.7)
Eosinophils Relative: 4 %
HCT: 30.7 % — ABNORMAL LOW (ref 39.0–52.0)
HEMOGLOBIN: 10.6 g/dL — AB (ref 13.0–17.0)
Lymphocytes Relative: 2 %
Lymphs Abs: 0.1 10*3/uL — ABNORMAL LOW (ref 0.7–4.0)
MCH: 28.2 pg (ref 26.0–34.0)
MCHC: 34.5 g/dL (ref 30.0–36.0)
MCV: 81.6 fL (ref 78.0–100.0)
MONOS PCT: 4 %
Monocytes Absolute: 0.3 10*3/uL (ref 0.1–1.0)
NEUTROS ABS: 6.1 10*3/uL (ref 1.7–7.7)
NEUTROS PCT: 90 %
Platelets: 25 10*3/uL — CL (ref 150–400)
RBC: 3.76 MIL/uL — AB (ref 4.22–5.81)
RDW: 19 % — ABNORMAL HIGH (ref 11.5–15.5)
WBC: 6.8 10*3/uL (ref 4.0–10.5)

## 2018-03-08 LAB — I-STAT CG4 LACTIC ACID, ED
LACTIC ACID, VENOUS: 4.51 mmol/L — AB (ref 0.5–1.9)
LACTIC ACID, VENOUS: 3.33 mmol/L — AB (ref 0.5–1.9)
LACTIC ACID, VENOUS: 3.4 mmol/L — AB (ref 0.5–1.9)

## 2018-03-08 LAB — I-STAT CHEM 8, ED
BUN: 54 mg/dL — AB (ref 6–20)
Calcium, Ion: 1.06 mmol/L — ABNORMAL LOW (ref 1.15–1.40)
Chloride: 95 mmol/L — ABNORMAL LOW (ref 101–111)
Creatinine, Ser: 7.9 mg/dL — ABNORMAL HIGH (ref 0.61–1.24)
Glucose, Bld: 136 mg/dL — ABNORMAL HIGH (ref 65–99)
HEMATOCRIT: 40 % (ref 39.0–52.0)
Hemoglobin: 13.6 g/dL (ref 13.0–17.0)
POTASSIUM: 4.3 mmol/L (ref 3.5–5.1)
SODIUM: 132 mmol/L — AB (ref 135–145)
TCO2: 26 mmol/L (ref 22–32)

## 2018-03-08 LAB — I-STAT TROPONIN, ED: Troponin i, poc: 1.8 ng/mL (ref 0.00–0.08)

## 2018-03-08 LAB — GLUCOSE, CAPILLARY: Glucose-Capillary: 151 mg/dL — ABNORMAL HIGH (ref 65–99)

## 2018-03-08 LAB — PROTIME-INR
INR: 1.99
Prothrombin Time: 22.4 seconds — ABNORMAL HIGH (ref 11.4–15.2)

## 2018-03-08 LAB — APTT: aPTT: 43 seconds — ABNORMAL HIGH (ref 24–36)

## 2018-03-08 LAB — PROCALCITONIN: PROCALCITONIN: 8.4 ng/mL

## 2018-03-08 MED ORDER — ACETAMINOPHEN 650 MG RE SUPP
650.0000 mg | Freq: Once | RECTAL | Status: AC
  Administered 2018-03-08: 650 mg via RECTAL
  Filled 2018-03-08: qty 1

## 2018-03-08 MED ORDER — SODIUM CHLORIDE 0.9 % IV SOLN: 250.0000 mL | INTRAVENOUS | Status: DC | PRN

## 2018-03-08 MED ORDER — ZINC OXIDE 40 % EX OINT: 1.0000 "application " | TOPICAL_OINTMENT | Freq: Every day | CUTANEOUS | Status: DC | PRN

## 2018-03-08 MED ORDER — RENA-VITE PO TABS: 1.0000 | ORAL_TABLET | Freq: Every day | ORAL | Status: DC

## 2018-03-08 MED ORDER — SODIUM CHLORIDE 0.9% FLUSH
3.0000 mL | INTRAVENOUS | Status: DC | PRN
  Administered 2018-03-13: 3 mL via INTRAVENOUS
  Filled 2018-03-08: qty 3

## 2018-03-08 MED ORDER — DICLOFENAC SODIUM 1 % TD GEL
2.0000 g | Freq: Three times a day (TID) | TRANSDERMAL | Status: DC
  Administered 2018-03-08 – 2018-03-17 (×10): 2 g via TOPICAL
  Filled 2018-03-08 (×2): qty 100

## 2018-03-08 MED ORDER — SEVELAMER CARBONATE 800 MG PO TABS
2400.0000 mg | ORAL_TABLET | Freq: Three times a day (TID) | ORAL | Status: DC
  Administered 2018-03-13: 2400 mg via ORAL
  Filled 2018-03-08 (×4): qty 3

## 2018-03-08 MED ORDER — CHLORHEXIDINE GLUCONATE CLOTH 2 % EX PADS: 6.0000 | MEDICATED_PAD | Freq: Every day | CUTANEOUS | Status: DC

## 2018-03-08 MED ORDER — ONDANSETRON HCL 4 MG/2ML IJ SOLN: 4.0000 mg | Freq: Four times a day (QID) | INTRAMUSCULAR | Status: DC | PRN

## 2018-03-08 MED ORDER — LACTULOSE 10 GM/15ML PO SOLN: 10.0000 g | Freq: Every day | ORAL | Status: DC | PRN

## 2018-03-08 MED ORDER — SODIUM CHLORIDE 0.9 % IV SOLN: 100.0000 mL | INTRAVENOUS | Status: DC | PRN

## 2018-03-08 MED ORDER — VANCOMYCIN HCL IN DEXTROSE 1-5 GM/200ML-% IV SOLN
1000.0000 mg | Freq: Once | INTRAVENOUS | Status: AC
  Administered 2018-03-08: 1000 mg via INTRAVENOUS
  Filled 2018-03-08: qty 200

## 2018-03-08 MED ORDER — SODIUM CHLORIDE 0.9 % IV SOLN
2.0000 g | Freq: Once | INTRAVENOUS | Status: AC
  Administered 2018-03-08: 2 g via INTRAVENOUS
  Filled 2018-03-08: qty 2

## 2018-03-08 MED ORDER — PRO-STAT SUGAR FREE PO LIQD
30.0000 mL | Freq: Two times a day (BID) | ORAL | Status: DC
  Administered 2018-03-10 – 2018-03-14 (×5): 30 mL via ORAL
  Filled 2018-03-08 (×7): qty 30

## 2018-03-08 MED ORDER — MELATONIN 3 MG PO TABS: 6.0000 mg | ORAL_TABLET | Freq: Every evening | ORAL | Status: DC | PRN

## 2018-03-08 MED ORDER — MIDODRINE HCL 5 MG PO TABS
5.0000 mg | ORAL_TABLET | ORAL | Status: DC
Start: 2018-03-10 — End: 2018-03-16
  Administered 2018-03-10 – 2018-03-15 (×2): 5 mg via ORAL
  Filled 2018-03-08 (×2): qty 1

## 2018-03-08 MED ORDER — DIPHENHYDRAMINE HCL 25 MG PO CAPS
25.0000 mg | ORAL_CAPSULE | Freq: Four times a day (QID) | ORAL | Status: DC | PRN
  Administered 2018-03-09: 25 mg via ORAL
  Filled 2018-03-08: qty 1

## 2018-03-08 MED ORDER — PENTAFLUOROPROP-TETRAFLUOROETH EX AERO: 1.0000 "application " | INHALATION_SPRAY | CUTANEOUS | Status: DC | PRN

## 2018-03-08 MED ORDER — ACETAMINOPHEN 500 MG PO TABS: 500.0000 mg | ORAL_TABLET | Freq: Four times a day (QID) | ORAL | Status: DC | PRN

## 2018-03-08 MED ORDER — PANTOPRAZOLE SODIUM 40 MG PO TBEC
40.0000 mg | DELAYED_RELEASE_TABLET | Freq: Every day | ORAL | Status: DC
  Administered 2018-03-08 – 2018-03-13 (×4): 40 mg via ORAL
  Filled 2018-03-08 (×5): qty 1

## 2018-03-08 MED ORDER — TRIPLE ANTIBIOTIC 5-400-5000 EX OINT: 1.0000 "application " | TOPICAL_OINTMENT | Freq: Every day | CUTANEOUS | Status: DC | PRN

## 2018-03-08 MED ORDER — SODIUM CHLORIDE 0.9% FLUSH
3.0000 mL | Freq: Two times a day (BID) | INTRAVENOUS | Status: DC
  Administered 2018-03-11 – 2018-03-13 (×4): 3 mL via INTRAVENOUS

## 2018-03-08 MED ORDER — ALLOPURINOL 300 MG PO TABS
300.0000 mg | ORAL_TABLET | Freq: Every day | ORAL | Status: DC
  Administered 2018-03-09 – 2018-03-11 (×3): 300 mg via ORAL
  Filled 2018-03-08 (×3): qty 1

## 2018-03-08 MED ORDER — AMIODARONE HCL 200 MG PO TABS
200.0000 mg | ORAL_TABLET | Freq: Every day | ORAL | Status: DC
Start: 2018-03-09 — End: 2018-03-16
  Administered 2018-03-08 – 2018-03-14 (×6): 200 mg via ORAL
  Filled 2018-03-08 (×6): qty 1

## 2018-03-08 MED ORDER — LIDOCAINE-PRILOCAINE 2.5-2.5 % EX CREA: 1.0000 "application " | TOPICAL_CREAM | CUTANEOUS | Status: DC | PRN

## 2018-03-08 MED ORDER — ONDANSETRON HCL 4 MG PO TABS
4.0000 mg | ORAL_TABLET | Freq: Four times a day (QID) | ORAL | Status: DC | PRN
  Filled 2018-03-08: qty 1

## 2018-03-08 MED ORDER — SODIUM CHLORIDE 0.9% FLUSH
3.0000 mL | Freq: Two times a day (BID) | INTRAVENOUS | Status: DC
  Administered 2018-03-09 – 2018-03-13 (×6): 3 mL via INTRAVENOUS

## 2018-03-08 MED ORDER — OXYBUTYNIN CHLORIDE 5 MG PO TABS
5.0000 mg | ORAL_TABLET | Freq: Two times a day (BID) | ORAL | Status: DC
  Administered 2018-03-08 – 2018-03-14 (×9): 5 mg via ORAL
  Filled 2018-03-08 (×10): qty 1

## 2018-03-08 MED ORDER — VANCOMYCIN HCL IN DEXTROSE 1-5 GM/200ML-% IV SOLN: 1000.0000 mg | Freq: Once | INTRAVENOUS | Status: DC

## 2018-03-08 MED ORDER — OXYCODONE-ACETAMINOPHEN 7.5-325 MG PO TABS
1.0000 | ORAL_TABLET | Freq: Four times a day (QID) | ORAL | Status: DC | PRN
  Administered 2018-03-08: 1 via ORAL
  Filled 2018-03-08: qty 1

## 2018-03-08 NOTE — Consult Note (Signed)
Renal Service Consult Note [facility]  Albert Stanley 03/08/2018 Sol Blazing Requesting Physician:  Dr Evangeline Gula  Reason for Consult:  ESRD pt with fever/ ams HPI: The patient is a 72 y.o. year-old with hx dvt/ ivc filter hypertension/ nicm esrd on hd diab mellitus type 2, hx bladder cancer hx indwelling foley > 1 year f/b urology with monthly catheter exchange, djd.  This is 4th admission since feb 2019 to hospital.  Asked to see for esrd.    Family here at bedside, pt confused and not giving any history.  Hx obtained from chart. Lives at snf. Signs off of dialysis frequently before finishing and last week was up 10L over dry wt per HD unit.   - feb 2019 > acute sdh underwent R craniotomy, vdrf, esrd, dm2 , nicm, his AVF thrombosed while in hospital and VVS put in new [facility] and new LUA AVG on 12/15/17. Had new DVT also hx DVT 2003.  Had ivc filter placed. Went to rehab - 4/25- 02/07/18 > hypoglycemia refractory rx'd D10 esrd on hd, esrd on hd hx bladder mass chronic foley and hx sdh - 4/20- 02/11/18 > dm2 w recurrent hypoglycemia rx'd D10 infusion, unable for family to care for pt at home, placed to SNF at dc. esrd on hd tts , hx vol overload and fluid noncompliance, nicm and chron hyoptension on midodrine prehd, anemia, low plts , mbd ckd    home meds:   - allopurinol 300 qd  - midodrine 5 mg pre hd tts  - amiodarone 200 qd  - mvi/ lactulose prn/ PPI/ renvela 3 ac tid/ percocet prn/ ditropan  - prn's/ vitamins/ colace   ROS  n/a  Past Medical History  Past Medical History:  Diagnosis Date  . Arthritis    Osteoarthritis  . Asthma   . Bladder cancer ([place]) dx'd 1990   surg only  . CHF (congestive heart failure) ([place])   . Diabetes mellitus without complication ([place])   . ESRD (end stage renal disease) ([name])    Tues, Thurs, Sat dialysis  . GERD (gastroesophageal reflux disease)   . History of cardiac catheterization    a. LHC 5/17: no obstructive CAD  . History  of DVT (deep vein thrombosis)    2003 ish  . History of nuclear stress test    a. Myoview 4/17: EF 39%, inf, inf-lat, apical inf, apical lat, apical scar, intermediate risk  . Hyperlipidemia   . Hypertension   . NICM (nonischemic cardiomyopathy) ([place])    a. Echo 2/17: mod LVH, EF 35-40%, inf-lat and inf-sept HK, mod MR, severe LAE, small pericardial effusion   Past Surgical History  Past Surgical History:  Procedure Laterality Date  . arthroscopic knee surgery Left   . AV FISTULA PLACEMENT Left 12/13/2015   Procedure: ARTERIOVENOUS (AV) FISTULA CREATION;  Surgeon: Angelia Mould, MD;  Location: [facility] OR;  Service: Vascular;  Laterality: Left;  . AV FISTULA PLACEMENT Left 12/15/2017   Procedure: INSERTION OF ARTERIOVENOUS (AV) GORE-TEX GRAFT ARM;  Surgeon: Serafina Mitchell, MD;  Location: [place];  Service: Vascular;  Laterality: Left;  . BACK SURGERY     2 times  1960  . CARDIAC CATHETERIZATION N/A 02/28/2016   Procedure: Left Heart Cath and Coronary Angiography;  Surgeon: Larey Dresser, MD;  Location: [place] CV LAB;  Service: Cardiovascular;  Laterality: N/A;  . CRANIOTOMY N/A 12/05/2017   Procedure: CRANIOTOMY HEMATOMA EVACUATION SUBDURAL;  Surgeon: Ashok Pall, MD;  Location: [facility]  OR;  Service: Neurosurgery;  Laterality: N/A;  . CYSTOSCOPY    . FISTULA SUPERFICIALIZATION Left 08/14/2016   Procedure: FISTULA SUPERFICIALIZATION LEFT UPPER ARM;  Surgeon: Angelia Mould, MD;  Location: [place];  Service: Vascular;  Laterality: Left;  . INSERTION OF DIALYSIS CATHETER N/A 12/13/2015   Procedure: INSERTION OF DIALYSIS CATHETER;  Surgeon: Angelia Mould, MD;  Location: [place];  Service: Vascular;  Laterality: N/A;  . INSERTION OF DIALYSIS CATHETER Left 05/07/2017   Procedure: INSERTION OF DIALYSIS CATHETER LEFT INTERNAL JUGULAR PLACEMENT;  Surgeon: Angelia Mould, MD;  Location: [place];  Service: Vascular;  Laterality: Left;  . INSERTION OF DIALYSIS CATHETER Left 12/15/2017    Procedure: INSERTION OF DIALYSIS CATHETER;  Surgeon: Serafina Mitchell, MD;  Location: [facility] OR;  Service: Vascular;  Laterality: Left;  . IR IVC FILTER PLMT / S&I Burke Keels GUID/MOD SED  12/18/2017  . IR RADIOLOGIST EVAL & MGMT  05/12/2017  . JOINT REPLACEMENT Left    hip  . LIGATION OF COMPETING BRANCHES OF ARTERIOVENOUS FISTULA Left 08/14/2016   Procedure: LIGATION OF COMPETING BRANCHES OF ARTERIOVENOUS FISTULA LEFT UPPER ARM;  Surgeon: Angelia Mould, MD;  Location: [place];  Service: Vascular;  Laterality: Left;  . PERIPHERAL VASCULAR CATHETERIZATION Left 06/09/2016   Procedure: Fistulagram;  Surgeon: Serafina Mitchell, MD;  Location: [place] CV LAB;  Service: Cardiovascular;  Laterality: Left;  ARM  . PERIPHERAL VASCULAR CATHETERIZATION Left 06/09/2016   Procedure: Peripheral Vascular Balloon Angioplasty;  Surgeon: Serafina Mitchell, MD;  Location: [place] CV LAB;  Service: Cardiovascular;  Laterality: Left;  upper arm venous  . PERIPHERAL VASCULAR CATHETERIZATION Left 06/25/2016   Procedure: Fistulagram;  Surgeon: Serafina Mitchell, MD;  Location: [place] CV LAB;  Service: Cardiovascular;  Laterality: Left;  . REVISON OF ARTERIOVENOUS FISTULA Left 05/07/2017   Procedure: REPAIR OF PSEUDOANEURYSM  LEFT BRACHIO-CEPHALIC  ARM ARTERIOVENOUS FISTULA;  Surgeon: Angelia Mould, MD;  Location: [facility] OR;  Service: Vascular;  Laterality: Left;   Family History  Family History  Problem Relation Age of Onset  . Arthritis Mother   . Cancer Father   . Heart disease Father   . Hypertension Sister   . Alcohol abuse Brother   . Diabetes Daughter   . Heart attack Paternal Uncle    Social History  reports that he quit smoking about 19 years ago. He quit after 30.00 years of use. He has never used smokeless tobacco. He reports that he does not drink alcohol or use drugs. Allergies  Allergies  Allergen Reactions  . Meperidine Shortness Of Breath and Other (See Comments)    Increased heart rate;  Reaction to demerol  . Penicillins Other (See Comments)    Has patient had a PCN reaction causing immediate rash, facial/tongue/throat swelling, SOB or lightheadedness with hypotension: Yes (syncope) Has patient had a PCN reaction causing severe rash involving mucus membranes or skin necrosis: No Has patient had a PCN reaction that required hospitalization: No (treated at MD's office) Has patient had a PCN reaction occurring within the last 10 years: No If all of the above answers are "NO", then may proceed with Cephalosporin use.  . Sulfa Antibiotics Shortness Of Breath and Other (See Comments)    Increased heart rate  . Sulfonamide Derivatives Shortness Of Breath and Other (See Comments)    TACHYCARDIA  . [place] Diarrhea, Nausea And Vomiting and Other (See Comments)    Constipation Reaction to whole milk  . Lactose Intolerance (  Gi) Diarrhea, Nausea And Vomiting and Other (See Comments)    Constipation Reaction to whole milk  . Milk-Related Compounds Diarrhea, Nausea And Vomiting and Other (See Comments)    Constipation Reaction to whole milk  . Foltx [Elfolate Plus] Other (See Comments)    UNSPECIFIED REACTION  "Pt does not recall reaction to this"  . Food Other (See Comments)    POTATOES  Unknown reaction  . Orange Juice [Orange Oil] Other (See Comments)    Unknown reaction  . Tomato Other (See Comments)    Unknown reaction   . Betadine [Povidone Iodine] Itching, Rash and Other (See Comments)    burning  . Eggs Or Egg-Derived Products Nausea And Vomiting    Reaction to egg yolks - not whites     Home medications Prior to Admission medications   Medication Sig Start Date End Date Taking? Authorizing Provider  acetaminophen (TYLENOL) 500 MG tablet Take 500 mg by mouth every 6 (six) hours as needed for mild pain.   Yes [provider]  allopurinol (ZYLOPRIM) 300 MG tablet Take 300 mg by mouth daily.    Yes [provider]  Amino Acids-Protein Hydrolys  (FEEDING SUPPLEMENT, PRO-STAT SUGAR FREE 64,) LIQD Take 30 mLs by mouth 2 (two) times daily.   Yes [provider]  amiodarone (PACERONE) 200 MG tablet Take 1 tablet (200 mg total) by mouth daily. 09/24/17  Yes Larey Dresser, MD  bisacodyl (DULCOLAX) 5 MG EC tablet Take 5 mg by mouth every other day. Hold for loose stools   Yes [provider]  diclofenac sodium (VOLTAREN) 1 % GEL Apply 2 g topically 3 (three) times daily. Apply to lower back, left hip and knee   Yes [provider]  diphenhydrAMINE (BENADRYL) 25 MG tablet Take 25 mg by mouth every 6 (six) hours as needed for allergies.   Yes [provider]  docusate sodium (COLACE) 100 MG capsule Take 100 mg by mouth 2 (two) times daily. Hold for loose stools.   Yes [provider]  Melatonin 3 MG TABS Take 6 mg by mouth at bedtime.    Yes [provider]  midodrine (PROAMATINE) 5 MG tablet Take 1 tablet (5 mg total) by mouth Every Tuesday,Thursday,and Saturday with dialysis. 02/12/18  Yes Hongalgi, Lenis Dickinson, MD  Multiple Vitamin (MULTIVITAMIN WITH MINERALS) TABS tablet Take 1 tablet by mouth daily.   Yes [provider]  Neo-Bacit-Poly-Lidocaine (PROCOMYCIN) 4 % CREA Apply 1 application topically daily as needed. apply to scalp   Yes [provider]  oxybutynin (DITROPAN) 5 MG tablet Take 5 mg by mouth 2 (two) times daily.   Yes [provider]  oxyCODONE-acetaminophen (PERCOCET) 7.5-325 MG tablet Take 1 tablet by mouth every 6 (six) hours as needed for moderate pain. 02/11/18  Yes Hongalgi, Lenis Dickinson, MD  pantoprazole (PROTONIX) 40 MG tablet Take 40 mg by mouth at bedtime.   Yes [provider]  senna (SENOKOT) 8.6 MG TABS tablet Take 1 tablet by mouth at bedtime.   Yes [provider]  sevelamer carbonate (RENVELA) 800 MG tablet Take 3 tablets (2,400 mg total) by mouth 3 (three) times daily with meals. 02/11/18  Yes Hongalgi, Lenis Dickinson, MD  Zinc Oxide 13 %  CREA Apply 1 application topically daily.   Yes [provider]  bacitracin ointment Apply 1 application topically 2 (two) times daily. Patient not taking: Reported on 03/08/2018 01/18/18   Antonietta Breach, PA-C  lactulose [facility]) 10 GM/15ML solution  Take 10 g by mouth daily as needed for mild constipation.    [provider]  liver oil-zinc oxide (DESITIN) 40 % ointment Apply 1 application topically daily as needed for irritation.    [provider]  multivitamin (RENA-VIT) TABS tablet Take 1 tablet by mouth at bedtime.  12/20/15   [provider]  neomycin-bacitracin-polymyxin (NEOSPORIN) [phone] ointment Apply 1 application topically daily as needed (area on scalp).    [provider]   Liver Function Tests Recent Labs  Lab 03/08/18 1319  AST 31  ALT 18  ALKPHOS 72  BILITOT 2.9*  PROT 5.5*  ALBUMIN 2.3*   No results for input(s): LIPASE, AMYLASE in the last 168 hours. CBC Recent Labs  Lab 03/08/18 1319 03/08/18 1339  WBC 6.8  --   NEUTROABS 6.1  --   HGB 10.6* 13.6  HCT 30.7* 40.0  MCV 81.6  --   PLT 25*  --    Basic Metabolic Panel Recent Labs  Lab 03/08/18 1319 03/08/18 1339  NA 134* 132*  K 4.4 4.3  CL 92* 95*  CO2 24  --   GLUCOSE 140* 136*  BUN 63* 54*  CREATININE 8.01* 7.90*  CALCIUM 9.3  --    Iron/TIBC/Ferritin/ %Sat    Component Value Date/Time   IRON 90 12/18/2017 1914   TIBC 183 (L) 12/18/2017 1914   FERRITIN 544 (H) 12/07/2015 2122   IRONPCTSAT 49 (H) 12/18/2017 1914   IRONPCTSAT 24 05/03/2006 1529    Vitals:   03/08/18 1430 03/08/18 1441 03/08/18 1501 03/08/18 1515  BP: 121/62 130/60 (!) 120/105 132/80  Pulse: (!) 49 (!) 102 99 97  Resp: 16 16 17 18   Temp:      TempSrc:      SpO2: 100% 100% 100% 100%   Exam Gen confused , eyes open , partially awake No rash, cyanosis or gangrene Sclera anicteric, throat clear  No jvd or bruits Chest clear bilat to bases RRR no MRG Abd soft ntnd no mass  or ascites +bs obese GU normal male w foley cath in place MS no joint effusions or deformity Ext 1+ bilat LE edema better than usual, no wounds or ulcers Neuro is lethargic and disoriented calm Left arm AVG +bruit/ L IJ cath clean exit site    Dialysis: tts [facility]  4h profile 4  2/2 bath  Heparin none (due to hx SDH)  Left upper AVG/ L IJ tdc - meds pending    Impression: 1 altered mental status/ fevers - r/o sepsis, indwelling foley at risk for uti; has tdc also but no gross exit site infection also the left arm avg is not grossly infected on exam 2 esrd on hd tts - will hold hd for today plan hd tomorrow when more stalbe 3 hx bladder mass / indwelling foley - changed monthly f/b urology dr [name] 4 hx dvt recurrent sp ivc filter recently  5 nicm hx chron vol overload 6 hypotension - chronic issue on pre hd midodrine, bp's ok here today 7 gout - on allopurinol 8 hist recurrent hypoglycemia - not an issue here not on insulin hx dm2 in the past   Plan - dialysis tomorrow  Kelly Splinter MD [place] pager [phone]   03/08/2018, 5:54 PM

## 2018-03-08 NOTE — ED Provider Notes (Signed)
[facility] EMERGENCY DEPARTMENT Provider Note   CSN: [id number] Arrival date & time: 03/08/18  1312     History   Chief Complaint Chief Complaint  Patient presents with  . Altered Mental Status    HPI Albert Stanley is a 72 y.o. male.  The history is provided by the patient. No language interpreter was used.  Altered Mental Status     Albert Stanley is a 72 y.o. male who presents to the Emergency Department complaining of AMS. Level V caveat due to altered mental status. History is provided by EMS and family. Per EMS he is been altered since yesterday. He is a resident [facility]. When he was brought to dialysis today they noted he was altered and called 911. At baseline he has confusion is nonambulatory. Additional history was available from wife and daughter after patient's ED arrival. They state that he had a fever to 100.7 yesterday it was noted to be a little bit confused and lethargic. They believe that he was evaluated by the physician this morning and blood work was performed but results are not available. Past Medical History:  Diagnosis Date  . Arthritis    Osteoarthritis  . Asthma   . Bladder cancer ([place]) dx'd 1990   surg only  . CHF (congestive heart failure) ([place])   . Diabetes mellitus without complication ([place])   . ESRD (end stage renal disease) ([name])    Tues, Thurs, Sat dialysis  . GERD (gastroesophageal reflux disease)   . History of cardiac catheterization    a. LHC 5/17: no obstructive CAD  . History of DVT (deep vein thrombosis)    2003 ish  . History of nuclear stress test    a. Myoview 4/17: EF 39%, inf, inf-lat, apical inf, apical lat, apical scar, intermediate risk  . Hyperlipidemia   . Hypertension   . NICM (nonischemic cardiomyopathy) ([place])    a. Echo 2/17: mod LVH, EF 35-40%, inf-lat and inf-sept HK, mod MR, severe LAE, small pericardial effusion    Patient Active Problem List   Diagnosis Date Noted  . Sepsis due  to undetermined organism with metabolic encephalopathy ([place]) 03/08/2018  . Benign fibroma of prostate 03/08/2018  . Cardiac arrhythmia 03/08/2018  . Chronic pain 03/08/2018  . Deep vein thrombosis (DVT) ([place]) 03/08/2018  . Gout 03/08/2018  . HLD (hyperlipidemia) 03/08/2018  . LBP (low back pain) 03/08/2018  . Malignant neoplasm of urinary bladder ([place]) 03/08/2018  . Foley catheter problem ([place]) 03/08/2018  . Pressure injury of skin 02/09/2018  . Hypoglycemia secondary to sulfonylurea 02/04/2018  . Hypoglycemia 02/03/2018  . Hematuria 01/06/2018  . Labile blood pressure   . Essential hypertension   . Acute on chronic diastolic heart failure ([place])   . Type 2 diabetes mellitus with peripheral neuropathy ([name])   . Acute deep vein thrombosis (DVT) of femoral vein of right lower extremity ([place])   . Traumatic subdural hematoma ([place]) 12/16/2017  . ESRD (end stage renal disease) ([place])   . ESRD (end stage renal disease) on dialysis ([place])   . Dysphagia   . Acute blood loss anemia   . Anemia of chronic disease   . Chronic diastolic congestive heart failure ([place])   . Diabetes mellitus type 2 in nonobese ([name])   . Benign essential HTN   . Nonischemic cardiomyopathy ([place])   . Transaminitis   . Encounter for central line placement   . Endotracheally intubated   . Ventilator dependent ([place])   .  Chronic renal impairment   . Chronic in-center hemodialysis status ([place])   . S/P craniotomy 12/05/2017  . Hyperkalemia 12/02/2017  . Malnutrition of moderate degree 12/18/2016  . PVC's (premature ventricular contractions)   . Anemia associated with chronic renal failure   . End-stage renal disease on hemodialysis ([place]) 12/09/2016  . Dyslipidemia associated with type 2 diabetes mellitus ([place]) 12/09/2016  . Acute on chronic systolic and diastolic heart failure, NYHA class 1 ([place]) 12/09/2016  . 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 Type  2 diabetes mellitus ([place]) 12/11/2015  . Fluid overload 12/06/2015  . History of DVT (deep vein thrombosis) 12/04/2015  . Renal hematoma 12/04/2015  . H/O: gout 11/16/2013  . Trochanteric bursitis 10/17/2013  . 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 Lumbar degenerative disc disease 07/17/2013    Past Surgical History:  Procedure Laterality Date  . arthroscopic knee surgery Left   . AV FISTULA PLACEMENT Left 12/13/2015   Procedure: ARTERIOVENOUS (AV) FISTULA CREATION;  Surgeon: Angelia Mould, MD;  Location: [facility] OR;  Service: Vascular;  Laterality: Left;  . AV FISTULA PLACEMENT Left 12/15/2017   Procedure: INSERTION OF ARTERIOVENOUS (AV) GORE-TEX GRAFT ARM;  Surgeon: Serafina Mitchell, MD;  Location: [facility];  Service: Vascular;  Laterality: Left;  . BACK SURGERY     2 times  1960  . CARDIAC CATHETERIZATION N/A 02/28/2016   Procedure: Left Heart Cath and Coronary Angiography;  Surgeon: Larey Dresser, MD;  Location: [facility] CV LAB;  Service: Cardiovascular;  Laterality: N/A;  . CRANIOTOMY N/A 12/05/2017   Procedure: CRANIOTOMY HEMATOMA EVACUATION SUBDURAL;  Surgeon: Ashok Pall, MD;  Location: [facility];  Service: Neurosurgery;  Laterality: N/A;  . CYSTOSCOPY    . FISTULA SUPERFICIALIZATION Left 08/14/2016   Procedure: FISTULA SUPERFICIALIZATION LEFT UPPER ARM;  Surgeon: Angelia Mould, MD;  Location: [facility];  Service: Vascular;  Laterality: Left;  . INSERTION OF DIALYSIS CATHETER N/A 12/13/2015   Procedure: INSERTION OF DIALYSIS CATHETER;  Surgeon: Angelia Mould, MD;  Location: [facility];  Service: Vascular;  Laterality: N/A;  . INSERTION OF DIALYSIS CATHETER Left 05/07/2017   Procedure: INSERTION OF DIALYSIS CATHETER LEFT INTERNAL JUGULAR PLACEMENT;  Surgeon: Angelia Mould, MD;  Location: [facility];  Service: Vascular;  Laterality: Left;  . INSERTION OF DIALYSIS CATHETER Left 12/15/2017   Procedure: INSERTION OF DIALYSIS CATHETER;  Surgeon: Serafina Mitchell, MD;  Location: [facility] OR;   Service: Vascular;  Laterality: Left;  . IR IVC FILTER PLMT / S&I Burke Keels GUID/MOD SED  12/18/2017  . IR RADIOLOGIST EVAL & MGMT  05/12/2017  . JOINT REPLACEMENT Left    hip  . LIGATION OF COMPETING BRANCHES OF ARTERIOVENOUS FISTULA Left 08/14/2016   Procedure: LIGATION OF COMPETING BRANCHES OF ARTERIOVENOUS FISTULA LEFT UPPER ARM;  Surgeon: Angelia Mould, MD;  Location: [facility];  Service: Vascular;  Laterality: Left;  . PERIPHERAL VASCULAR CATHETERIZATION Left 06/09/2016   Procedure: Fistulagram;  Surgeon: Serafina Mitchell, MD;  Location: [facility];  Service: Cardiovascular;  Laterality: Left;  ARM  . PERIPHERAL VASCULAR CATHETERIZATION Left 06/09/2016   Procedure: Peripheral Vascular Balloon Angioplasty;  Surgeon: Serafina Mitchell, MD;  Location: [facility] CV LAB;  Service: Cardiovascular;  Laterality: Left;  upper arm venous  . PERIPHERAL VASCULAR CATHETERIZATION Left 06/25/2016   Procedure: Fistulagram;  Surgeon: Serafina Mitchell, MD;  Location: [facility] LAB;  Service: Cardiovascular;  Laterality: Left;  . REVISON  OF ARTERIOVENOUS FISTULA Left 05/07/2017   Procedure: REPAIR OF PSEUDOANEURYSM  LEFT BRACHIO-CEPHALIC  ARM ARTERIOVENOUS FISTULA;  Surgeon: Angelia Mould, MD;  Location: [facility];  Service: Vascular;  Laterality: Left;        Home Medications    Prior to Admission medications   Medication Sig Start Date End Date Taking? Authorizing Provider  acetaminophen (TYLENOL) 500 MG tablet Take 500 mg by mouth every 6 (six) hours as needed for mild pain.   Yes [provider]  allopurinol (ZYLOPRIM) 300 MG tablet Take 300 mg by mouth daily.    Yes [provider]  Amino Acids-Protein Hydrolys (FEEDING SUPPLEMENT, PRO-STAT SUGAR FREE 64,) LIQD Take 30 mLs by mouth 2 (two) times daily.   Yes [provider]  amiodarone (PACERONE) 200 MG tablet Take 1 tablet (200 mg total) by mouth daily. 09/24/17  Yes Larey Dresser, MD  bisacodyl (DULCOLAX) 5  MG EC tablet Take 5 mg by mouth every other day. Hold for loose stools   Yes [provider]  diclofenac sodium (VOLTAREN) 1 % GEL Apply 2 g topically 3 (three) times daily. Apply to lower back, left hip and knee   Yes [provider]  diphenhydrAMINE (BENADRYL) 25 MG tablet Take 25 mg by mouth every 6 (six) hours as needed for allergies.   Yes [provider]  docusate sodium (COLACE) 100 MG capsule Take 100 mg by mouth 2 (two) times daily. Hold for loose stools.   Yes [provider]  Melatonin 3 MG TABS Take 6 mg by mouth at bedtime.    Yes [provider]  midodrine (PROAMATINE) 5 MG tablet Take 1 tablet (5 mg total) by mouth Every Tuesday,Thursday,and Saturday with dialysis. 02/12/18  Yes Hongalgi, Lenis Dickinson, MD  Multiple Vitamin (MULTIVITAMIN WITH MINERALS) TABS tablet Take 1 tablet by mouth daily.   Yes [provider]  Neo-Bacit-Poly-Lidocaine (PROCOMYCIN) 4 % CREA Apply 1 application topically daily as needed. apply to scalp   Yes [provider]  oxybutynin (DITROPAN) 5 MG tablet Take 5 mg by mouth 2 (two) times daily.   Yes [provider]  oxyCODONE-acetaminophen (PERCOCET) 7.5-325 MG tablet Take 1 tablet by mouth every 6 (six) hours as needed for moderate pain. 02/11/18  Yes Hongalgi, Lenis Dickinson, MD  pantoprazole (PROTONIX) 40 MG tablet Take 40 mg by mouth at bedtime.   Yes [provider]  senna (SENOKOT) 8.6 MG TABS tablet Take 1 tablet by mouth at bedtime.   Yes [provider]  sevelamer carbonate (RENVELA) 800 MG tablet Take 3 tablets (2,400 mg total) by mouth 3 (three) times daily with meals. 02/11/18  Yes Hongalgi, Lenis Dickinson, MD  Zinc Oxide 13 % CREA Apply 1 application topically daily.   Yes [provider]  bacitracin ointment Apply 1 application topically 2 (two) times daily. Patient not taking: Reported on 03/08/2018 01/18/18   Antonietta Breach, PA-C  lactulose [facility]) 10 GM/15ML solution Take  10 g by mouth daily as needed for mild constipation.    [provider]  liver oil-zinc oxide (DESITIN) 40 % ointment Apply 1 application topically daily as needed for irritation.    [provider]  multivitamin (RENA-VIT) TABS tablet Take 1 tablet by mouth at bedtime.  12/20/15   [provider]  neomycin-bacitracin-polymyxin (NEOSPORIN) [phone] ointment Apply 1 application topically daily as needed (area on scalp).    [provider]    Family History Family History  Problem Relation Age  of Onset  . Arthritis Mother   . Cancer Father   . Heart disease Father   . Hypertension Sister   . Alcohol abuse Brother   . Diabetes Daughter   . Heart attack Paternal Uncle     Social History Social History   Tobacco Use  . Smoking status: Former Smoker    Years: 30.00    Last attempt to quit: 10/12/1998    Years since quitting: 19.4  . Smokeless tobacco: Never Used  Substance Use Topics  . Alcohol use: No    Alcohol/week: 0.0 oz    Comment: Used to drink on the weekends, quit 2 years ago  . Drug use: No     Allergies   Meperidine; Penicillins; Sulfa antibiotics; Sulfonamide derivatives; Lac bovis; Lactose intolerance (gi); Milk-related compounds; Foltx [elfolate plus]; Food; Orange juice [orange oil]; Tomato; Betadine [povidone iodine]; and Eggs or egg-derived products   Review of Systems Review of Systems  All other systems reviewed and are negative.    Physical Exam Updated Vital Signs BP 132/80   Pulse 97   Temp (!) 100.8 F (38.2 C) (Oral)   Resp 18   SpO2 100%   Physical Exam  Constitutional: He appears well-developed. He appears distressed.  HENT:  Head: Normocephalic and atraumatic.  Cardiovascular: Regular rhythm.  No murmur heard. tachycardic  Pulmonary/Chest: No respiratory distress.  Tachypnea, Occasional rhonchi. Vas cath in left anterior chest  Abdominal: Soft. There is no tenderness. There is no rebound and no  guarding.  Musculoskeletal:  Nonpitting edema to BLE.  Fistula to LUE with area of firm fullness on distal aspect - no local erythema or edema. Mild erythema and edema to left hand.  Erythema to plantar surface of bilateral feet.    Neurological:  Drowsy, dysarthric speech, difficulty following commands.  Moves all extremities weakly.    Skin: Skin is warm and dry.  Psychiatric: He has a normal mood and affect. His behavior is normal.  Nursing note and vitals reviewed.    ED Treatments / Results  Labs (all labs ordered are listed, but only abnormal results are displayed) Labs Reviewed  COMPREHENSIVE METABOLIC PANEL - Abnormal; Notable for the following components:      Result Value   Sodium 134 (*)    Chloride 92 (*)    Glucose, Bld 140 (*)    BUN 63 (*)    Creatinine, Ser 8.01 (*)    Total Protein 5.5 (*)    Albumin 2.3 (*)    Total Bilirubin 2.9 (*)    GFR calc non Af Amer 6 (*)    GFR calc Af Amer 7 (*)    Anion gap 18 (*)    All other components within normal limits  CBC WITH DIFFERENTIAL/PLATELET - Abnormal; Notable for the following components:   RBC 3.76 (*)    Hemoglobin 10.6 (*)    HCT 30.7 (*)    RDW 19.0 (*)    Platelets 25 (*)    Lymphs Abs 0.1 (*)    All other components within normal limits  I-STAT TROPONIN, ED - Abnormal; Notable for the following components:   Troponin i, poc 1.80 (*)    All other components within normal limits  I-STAT CHEM 8, ED - Abnormal; Notable for the following components:   Sodium 132 (*)    Chloride 95 (*)    BUN 54 (*)    Creatinine, Ser 7.90 (*)    Glucose, Bld 136 (*)    Calcium,  Ion 1.06 (*)    All other components within normal limits  I-STAT CG4 LACTIC ACID, ED - Abnormal; Notable for the following components:   Lactic Acid, Venous 4.51 (*)    All other components within normal limits  CBG MONITORING, ED - Abnormal; Notable for the following components:   Glucose-Capillary 145 (*)    All other components within  normal limits  I-STAT CG4 LACTIC ACID, ED - Abnormal; Notable for the following components:   Lactic Acid, Venous 3.33 (*)    All other components within normal limits  I-STAT CG4 LACTIC ACID, ED - Abnormal; Notable for the following components:   Lactic Acid, Venous 3.40 (*)    All other components within normal limits  CULTURE, BLOOD (ROUTINE X 2)  CULTURE, BLOOD (ROUTINE X 2)  URINE CULTURE  PROTIME-INR  URINALYSIS, ROUTINE W REFLEX MICROSCOPIC  PROCALCITONIN  PROTIME-INR  APTT  MAGNESIUM  BASIC METABOLIC PANEL  CBC  I-STAT VENOUS BLOOD GAS, ED  I-STAT CG4 LACTIC ACID, ED    EKG EKG Interpretation  Date/Time:  Tuesday Mar 08 2018 14:26:44 EDT Ventricular Rate:  105 PR Interval:    QRS Duration: 115 QT Interval:  369 QTC Calculation: 442 R Axis:   -33 Text Interpretation:  Sinus tachycardia Multiform ventricular premature complexes Incomplete left bundle branch block LVH with secondary repolarization abnormality Anterior Q waves, possibly due to LVH Confirmed by Quintella Reichert [phone]) on 03/08/2018 3:31:32 PM   Radiology Ct Head Wo Contrast  Result Date: 03/08/2018 CLINICAL DATA:  Altered level of consciousness EXAM: CT HEAD WITHOUT CONTRAST TECHNIQUE: Contiguous axial images were obtained from the base of the skull through the vertex without intravenous contrast. COMPARISON:  01/18/2018 FINDINGS: Brain: No evidence of acute infarction, hemorrhage, extra-axial collection, ventriculomegaly, or mass effect. Generalized cerebral atrophy. Periventricular white matter low attenuation likely secondary to microangiopathy. Vascular: Cerebrovascular atherosclerotic calcifications are noted. Skull: Prior right frontoparietal craniotomy. Sinuses/Orbits: Visualized portions of the orbits are unremarkable. Visualized portions of the paranasal sinuses and mastoid air cells are unremarkable. Other: None. IMPRESSION: No acute intracranial pathology. Electronically Signed   By: Kathreen Devoid    On: 03/08/2018 14:29   Dg Chest Port 1 View  Result Date: 03/08/2018 CLINICAL DATA:  72 year old male with altered mental status. End-stage renal disease. EXAM: PORTABLE CHEST 1 VIEW COMPARISON:  02/26/2018 and earlier. FINDINGS: Portable AP semi upright view at 1320 hours. Stable left chest tunneled IJ approach dialysis catheter. Stable cardiomegaly and mediastinal contours. Lower lung volumes. Pulmonary vascularity appears stable. No pneumothorax. Mild increased bibasilar opacity with no definite consolidation or pleural effusion. No acute osseous abnormality identified. IMPRESSION: 1. Lower lung volumes with bibasilar opacity suspected due to atelectasis. 2. Stable cardiomegaly and left chest dialysis catheter. Electronically Signed   By: Genevie Ann M.D.   On: 03/08/2018 13:31    Procedures Procedures (including critical care time) CRITICAL CARE Performed by: Quintella Reichert   Total critical care time: 35 minutes  Critical care time was exclusive of separately billable procedures and treating other patients.  Critical care was necessary to treat or prevent imminent or life-threatening deterioration.  Critical care was time spent personally by me on the following activities: development of treatment plan with patient and/or surrogate as well as nursing, discussions with consultants, evaluation of patient's response to treatment, examination of patient, obtaining history from patient or surrogate, ordering and performing treatments and interventions, ordering and review of laboratory studies, ordering and review of radiographic studies, pulse oximetry and re-evaluation of  patient's condition.  Medications Ordered in ED Medications  amiodarone (PACERONE) tablet 200 mg (has no administration in time range)  midodrine (PROAMATINE) tablet 5 mg (has no administration in time range)  oxyCODONE-acetaminophen (PERCOCET) 7.5-325 MG per tablet 1 tablet (has no administration in time range)  sevelamer  carbonate (RENVELA) tablet 2,400 mg (has no administration in time range)  acetaminophen (TYLENOL) tablet 500 mg (has no administration in time range)  allopurinol (ZYLOPRIM) tablet 300 mg (has no administration in time range)  feeding supplement (PRO-STAT SUGAR FREE 64) liquid 30 mL (has no administration in time range)  diclofenac sodium (VOLTAREN) 1 % transdermal gel 2 g (has no administration in time range)  diphenhydrAMINE (BENADRYL) tablet 25 mg (has no administration in time range)  lactulose (CHRONULAC) 10 GM/15ML solution 10 g (has no administration in time range)  liver oil-zinc oxide (DESITIN) 40 % ointment 1 application (has no administration in time range)  Melatonin TABS 6 mg (has no administration in time range)  multivitamin (RENA-VIT) tablet 1 tablet (has no administration in time range)  neomycin-bacitracin-polymyxin (NEOSPORIN) ointment 1 application (has no administration in time range)  oxybutynin (DITROPAN) tablet 5 mg (has no administration in time range)  pantoprazole (PROTONIX) EC tablet 40 mg (has no administration in time range)  sodium chloride flush (NS) 0.9 % injection 3 mL (has no administration in time range)  sodium chloride flush (NS) 0.9 % injection 3 mL (has no administration in time range)  sodium chloride flush (NS) 0.9 % injection 3 mL (has no administration in time range)  0.9 %  sodium chloride infusion (has no administration in time range)  ondansetron (ZOFRAN) tablet 4 mg (has no administration in time range)    Or  ondansetron (ZOFRAN) injection 4 mg (has no administration in time range)  vancomycin (VANCOCIN) IVPB 1000 mg/200 mL premix (has no administration in time range)  vancomycin (VANCOCIN) IVPB 1000 mg/200 mL premix (0 mg Intravenous Stopped 03/08/18 1533)  acetaminophen (TYLENOL) suppository 650 mg (650 mg Rectal Given 03/08/18 1651)  ceFEPIme (MAXIPIME) 2 g in sodium chloride 0.9 % 100 mL IVPB (2 g Intravenous New Bag/Given 03/08/18 1650)      Initial Impression / Assessment and Plan / ED Course  I have reviewed the triage vital signs and the nursing notes.  Pertinent labs & imaging results that were available during my care of the patient were reviewed by me and considered in my medical decision making (see chart for details).     Patient here for evaluation of altered mental status. He is ill appearing in lethargic on examination with tachypnea. Treating with broad-spectrum antibiotics for presumed sepsis and setting of fever. Family states that he would not like aggressive measures and he is DNR and DNI. Chest x-ray with questionable pneumonia. He does have erythema to his left hand and pain with range of motion of the arm but this is not localizable to one particular region of the arm. IV fluids held despite elevated lactate as patient is ESRD on dialysis and has not received this dialysis session today and he does not want aggressive measures. Hospitals consulted for admission for further treatment.  Troponin elevated compared to baseline, EKG without significant change compared to priors.   Final Clinical Impressions(s) / ED Diagnoses   Final diagnoses:  Sepsis [facility])    ED Discharge Orders    None       Quintella Reichert, MD 03/08/18 1731

## 2018-03-08 NOTE — H&P (Signed)
History and Physical    [name]:[id number] DOB: [birth year] DOA: 03/08/2018  PCP: [name], MD  Patient coming from: [place] have personally briefly reviewed patient's old medical records in [place]  Chief Complaint: She not acting himself at hemodialysis  HPI: Albert Stanley is a  72 year old male with PMH of ESRD on TTS HD, chronic hypotension on midodrine, DM type II, NICM EF 05%, chronic systolic CHF, SDH 12/9765 status post craniotomy, right femoral DVT status post IVC filter, urinary retention, bladder cancer status post resection, renal mass, recent hospitalization 02/03/2018- 02/07/2018 for refractory hypoglycemia, glipizide was switched to glyburide on discharge, presented to [facility] ED on 02/08/2018 with confusion and another episode of symptomatic hypoglycemia/CBG 39.  Ultimately all oral and subcu hypoglycemics were discontinued and the patient was finally discharged home on May 3.  He now presents today confused and not acting himself sent in by the dialysis center.  Family states that they were notified by [facility] the patient was not acting right about 5 or 6 PM yesterday.  He was mildly febrile they gave him a Tylenol and a Percocet and this morning he was still confused and they sent him on to hemodialysis.  At dialysis they were concerned about the patient's mental status noted that he had a low-grade temperature and sent him in for further evaluation.  In the emergency department he was febrile to 100.8 and his pulse was 102.  He was also thrombocytopenic with a white blood cell count of 6.8.  Physical examination revealed a foul-smelling catheter in the bladder with brown type of urine in the bag.  Wife states that the catheter has been present since approximately 18 months ago and is regularly changed but that the odor is persistent.'s full skin evaluation was performed and there were no areas of ulcerations or concern.  X-ray was obtained and it  showed volumes and bibasilar opacities consistent with atelectasis but pneumonia could not be ruled out.  She was referred to me for further evaluation and management.  ED Course: Patient was given cefepime and vancomycin CT of the head was unremarkable.  EKG showed multifocal PVCs, a left chest dialysis catheter was noted on his chest x-ray and he had stable cardiomegaly with low lung volumes and the A4 mentioned basilar opacities.    Review of Systems: Patient is unable to answer questions and give a history in terms of review of systems most of the history is obtained from the ER physician and the patient's family as well as patient's record.  Past Medical History:  Diagnosis Date  . Arthritis    Osteoarthritis  . Asthma   . Bladder cancer ([place]) dx'd 1990   surg only  . CHF (congestive heart failure) ([place])   . Diabetes mellitus without complication ([place])   . ESRD (end stage renal disease) ([name])    Tues, Thurs, Sat dialysis  . GERD (gastroesophageal reflux disease)   . History of cardiac catheterization    a. LHC 5/17: no obstructive CAD  . History of DVT (deep vein thrombosis)    2003 ish  . History of nuclear stress test    a. Myoview 4/17: EF 39%, inf, inf-lat, apical inf, apical lat, apical scar, intermediate risk  . Hyperlipidemia   . Hypertension   . NICM (nonischemic cardiomyopathy) ([place])    a. Echo 2/17: mod LVH, EF 35-40%, inf-lat and inf-sept HK, mod MR, severe LAE, small pericardial effusion    Past  Surgical History:  Procedure Laterality Date  . arthroscopic knee surgery Left   . AV FISTULA PLACEMENT Left 12/13/2015   Procedure: ARTERIOVENOUS (AV) FISTULA CREATION;  Surgeon: Angelia Mould, MD;  Location: [facility] OR;  Service: Vascular;  Laterality: Left;  . AV FISTULA PLACEMENT Left 12/15/2017   Procedure: INSERTION OF ARTERIOVENOUS (AV) GORE-TEX GRAFT ARM;  Surgeon: Serafina Mitchell, MD;  Location: [place];  Service: Vascular;  Laterality: Left;  . BACK SURGERY     2  times  1960  . CARDIAC CATHETERIZATION N/A 02/28/2016   Procedure: Left Heart Cath and Coronary Angiography;  Surgeon: Larey Dresser, MD;  Location: [place] CV LAB;  Service: Cardiovascular;  Laterality: N/A;  . CRANIOTOMY N/A 12/05/2017   Procedure: CRANIOTOMY HEMATOMA EVACUATION SUBDURAL;  Surgeon: Ashok Pall, MD;  Location: [place];  Service: Neurosurgery;  Laterality: N/A;  . CYSTOSCOPY    . FISTULA SUPERFICIALIZATION Left 08/14/2016   Procedure: FISTULA SUPERFICIALIZATION LEFT UPPER ARM;  Surgeon: Angelia Mould, MD;  Location: [place];  Service: Vascular;  Laterality: Left;  . INSERTION OF DIALYSIS CATHETER N/A 12/13/2015   Procedure: INSERTION OF DIALYSIS CATHETER;  Surgeon: Angelia Mould, MD;  Location: [place];  Service: Vascular;  Laterality: N/A;  . INSERTION OF DIALYSIS CATHETER Left 05/07/2017   Procedure: INSERTION OF DIALYSIS CATHETER LEFT INTERNAL JUGULAR PLACEMENT;  Surgeon: Angelia Mould, MD;  Location: [place];  Service: Vascular;  Laterality: Left;  . INSERTION OF DIALYSIS CATHETER Left 12/15/2017   Procedure: INSERTION OF DIALYSIS CATHETER;  Surgeon: Serafina Mitchell, MD;  Location: [facility];  Service: Vascular;  Laterality: Left;  . IR IVC FILTER PLMT / S&I Burke Keels GUID/MOD SED  12/18/2017  . IR RADIOLOGIST EVAL & MGMT  05/12/2017  . JOINT REPLACEMENT Left    hip  . LIGATION OF COMPETING BRANCHES OF ARTERIOVENOUS FISTULA Left 08/14/2016   Procedure: LIGATION OF COMPETING BRANCHES OF ARTERIOVENOUS FISTULA LEFT UPPER ARM;  Surgeon: Angelia Mould, MD;  Location: [place];  Service: Vascular;  Laterality: Left;  . PERIPHERAL VASCULAR CATHETERIZATION Left 06/09/2016   Procedure: Fistulagram;  Surgeon: Serafina Mitchell, MD;  Location: [place] CV LAB;  Service: Cardiovascular;  Laterality: Left;  ARM  . PERIPHERAL VASCULAR CATHETERIZATION Left 06/09/2016   Procedure: Peripheral Vascular Balloon Angioplasty;  Surgeon: Serafina Mitchell, MD;  Location: [place] CV  LAB;  Service: Cardiovascular;  Laterality: Left;  upper arm venous  . PERIPHERAL VASCULAR CATHETERIZATION Left 06/25/2016   Procedure: Fistulagram;  Surgeon: Serafina Mitchell, MD;  Location: [place] CV LAB;  Service: Cardiovascular;  Laterality: Left;  . REVISON OF ARTERIOVENOUS FISTULA Left 05/07/2017   Procedure: REPAIR OF PSEUDOANEURYSM  LEFT BRACHIO-CEPHALIC  ARM ARTERIOVENOUS FISTULA;  Surgeon: Angelia Mould, MD;  Location: [facility] OR;  Service: Vascular;  Laterality: Left;    Social History   Social History Narrative   Lives with wife and daughter.     reports that he quit smoking about 19 years ago. He quit after 30.00 years of use. He has never used smokeless tobacco. He reports that he does not drink alcohol or use drugs.  Allergies  Allergen Reactions  . Meperidine Shortness Of Breath and Other (See Comments)    Increased heart rate Reaction to demerol  . Penicillins Other (See Comments)    "Knocks me out."   PATIENT HAS HAD A PCN REACTION WITH IMMEDIATE RASH, FACIAL/TONGUE/THROAT SWELLING, SOB, OR LIGHTHEADEDNESS WITH HYPOTENSION:  # # #  YES  >>  SYNCOPE ### Has patient had a PCN reaction causing severe rash involving mucus membranes or skin necrosis: unknown PATIENT HAS HAD A PCN REACTION THAT REQUIRED TREATMENT AT MD'S OFFICE Has patient had a PCN reaction occurring within the last 10 years: no   . Sulfa Antibiotics Shortness Of Breath and Other (See Comments)    Increased heart rate  . Sulfonamide Derivatives Shortness Of Breath and Other (See Comments)    TACHYCARDIA  . Lac Bovis Diarrhea, Nausea And Vomiting and Other (See Comments)    Constipation Reaction to whole milk  . Lactose Intolerance (Gi) Diarrhea, Nausea And Vomiting and Other (See Comments)    Constipation Reaction to whole milk  . Milk-Related Compounds Diarrhea, Nausea And Vomiting and Other (See Comments)    Constipation Reaction to whole milk  . Foltx [Elfolate Plus] Other (See Comments)      UNSPECIFIED REACTION  "Pt does not recall reaction to this"  . Betadine [Povidone Iodine] Itching, Rash and Other (See Comments)    burning  . Eggs Or Egg-Derived Products Nausea And Vomiting    Reaction to egg yolks - not whites      Family History  Problem Relation Age of Onset  . Arthritis Mother   . Cancer Father   . Heart disease Father   . Hypertension Sister   . Alcohol abuse Brother   . Diabetes Daughter   . Heart attack Paternal Uncle      Prior to Admission medications   Medication Sig Start Date End Date Taking? Authorizing Provider  acetaminophen (TYLENOL) 500 MG tablet Take 500 mg by mouth every 6 (six) hours as needed for mild pain.    [provider]  allopurinol (ZYLOPRIM) 300 MG tablet Take 300 mg by mouth daily.     [provider]  Amino Acids-Protein Hydrolys (FEEDING SUPPLEMENT, PRO-STAT SUGAR FREE 64,) LIQD Take 30 mLs by mouth 2 (two) times daily.    [provider]  amiodarone (PACERONE) 200 MG tablet Take 1 tablet (200 mg total) by mouth daily. 09/24/17   Larey Dresser, MD  bacitracin ointment Apply 1 application topically 2 (two) times daily. 01/18/18   Antonietta Breach, PA-C  diclofenac sodium (VOLTAREN) 1 % GEL Apply 2 g topically 3 (three) times daily. Apply to lower back, left hip and knee    [provider]  diphenhydrAMINE (BENADRYL) 25 MG tablet Take 25 mg by mouth every 6 (six) hours as needed for allergies.    [provider]  lactulose (CHRONULAC) 10 GM/15ML solution Take 10 g by mouth daily as needed for mild constipation.    [provider]  liver oil-zinc oxide (DESITIN) 40 % ointment Apply 1 application topically daily as needed for irritation.    [provider]  Melatonin 3 MG TABS Take 6 mg by mouth at bedtime as needed (sleep).     [provider]  midodrine (PROAMATINE) 5 MG tablet Take 1 tablet (5 mg total) by mouth Every Tuesday,Thursday,and Saturday with dialysis.  02/12/18   Hongalgi, Lenis Dickinson, MD  multivitamin (RENA-VIT) TABS tablet Take 1 tablet by mouth at bedtime.  12/20/15   [provider]  neomycin-bacitracin-polymyxin (NEOSPORIN) [phone] ointment Apply 1 application topically daily as needed (area on scalp).    [provider]  oxybutynin (DITROPAN) 5 MG tablet Take 5 mg by mouth 2 (two) times daily.    [provider]  oxyCODONE-acetaminophen (PERCOCET) 7.5-325 MG tablet Take 1 tablet by mouth every 6 (six) hours  as needed for moderate pain. 02/11/18   Hongalgi, Lenis Dickinson, MD  pantoprazole (PROTONIX) 40 MG tablet Take 40 mg by mouth at bedtime.    [provider]  sevelamer carbonate (RENVELA) 800 MG tablet Take 3 tablets (2,400 mg total) by mouth 3 (three) times daily with meals. 02/11/18   Hongalgi, Lenis Dickinson, MD    Physical Exam:  Constitutional: NAD, calm, comfortable, unintelligible speech but alert Vitals:   03/08/18 1430 03/08/18 1441 03/08/18 1501 03/08/18 1515  BP: 121/62 130/60 (!) 120/105 132/80  Pulse: (!) 49 (!) 102 99 97  Resp: 16 16 17 18   Temp:      TempSrc:      SpO2: 100% 100% 100% 100%   Eyes: PERRL, lids and conjunctivae normal ENMT: Mucous membranes are moist. Posterior pharynx clear of any exudate or lesions.Normal dentition.  Neck: normal, supple, no masses, no thyromegaly Respiratory: clear to auscultation bilaterally, no wheezing, no crackles. Normal respiratory effort. No accessory muscle use.  Cardiovascular: Regular rate and rhythm, no murmurs / rubs / gallops. No extremity edema. 2+ pedal pulses. No carotid bruits.  Abdomen: no tenderness, no masses palpated. No hepatosplenomegaly. Bowel sounds positive.  Foley catheter in place with foul smell emanating from it Musculoskeletal: no clubbing / cyanosis. No joint deformity upper and lower extremities. Good ROM, no contractures. Normal muscle tone.  Skin: no rashes, lesions, ulcers. No induration Neurologic: CN 2-12 grossly intact.  Sensation intact, DTR normal.  Decreased to 4-/5 in right upper and bilateral lower extremities; strength 3 out of 4 in the left upper extremity Psychiatric: Awake and alert unable to answer questions    Labs on Admission: I have personally reviewed following labs and imaging studies  CBC: Recent Labs  Lab 03/08/18 1319 03/08/18 1339  WBC 6.8  --   NEUTROABS 6.1  --   HGB 10.6* 13.6  HCT 30.7* 40.0  MCV 81.6  --   PLT 25*  --    Basic Metabolic Panel: Recent Labs  Lab 03/08/18 1319 03/08/18 1339  NA 134* 132*  K 4.4 4.3  CL 92* 95*  CO2 24  --   GLUCOSE 140* 136*  BUN 63* 54*  CREATININE 8.01* 7.90*  CALCIUM 9.3  --    GFR: Estimated Creatinine Clearance: 9.9 mL/min (A) (by C-G formula based on SCr of 7.9 mg/dL (H)). Liver Function Tests: Recent Labs  Lab 03/08/18 1319  AST 31  ALT 18  ALKPHOS 72  BILITOT 2.9*  PROT 5.5*  ALBUMIN 2.3*   CBG: Recent Labs  Lab 03/08/18 1310  GLUCAP 145*   Urine analysis:    Component Value Date/Time   COLORURINE YELLOW 06/26/2017 1652   APPEARANCEUR TURBID (A) 06/26/2017 1652   LABSPEC 1.015 06/26/2017 1652   PHURINE 6.0 06/26/2017 1652   GLUCOSEU NEGATIVE 06/26/2017 1652   HGBUR LARGE (A) 06/26/2017 1652   BILIRUBINUR NEGATIVE 06/26/2017 1652   KETONESUR NEGATIVE 06/26/2017 1652   PROTEINUR >=300 (A) 06/26/2017 1652   UROBILINOGEN 0.2 05/28/2010 1109   NITRITE NEGATIVE 06/26/2017 1652   LEUKOCYTESUR MODERATE (A) 06/26/2017 1652    Radiological Exams on Admission: Ct Head Wo Contrast  Result Date: 03/08/2018 CLINICAL DATA:  Altered level of consciousness EXAM: CT HEAD WITHOUT CONTRAST TECHNIQUE: Contiguous axial images were obtained from the base of the skull through the vertex without intravenous contrast. COMPARISON:  01/18/2018 FINDINGS: Brain: No evidence of acute infarction, hemorrhage, extra-axial collection, ventriculomegaly, or mass effect. Generalized cerebral atrophy. Periventricular white matter low  attenuation  likely secondary to microangiopathy. Vascular: Cerebrovascular atherosclerotic calcifications are noted. Skull: Prior right frontoparietal craniotomy. Sinuses/Orbits: Visualized portions of the orbits are unremarkable. Visualized portions of the paranasal sinuses and mastoid air cells are unremarkable. Other: None. IMPRESSION: No acute intracranial pathology. Electronically Signed   By: Kathreen Devoid   On: 03/08/2018 14:29   Dg Chest Port 1 View  Result Date: 03/08/2018 CLINICAL DATA:  72 year old male with altered mental status. End-stage renal disease. EXAM: PORTABLE CHEST 1 VIEW COMPARISON:  02/26/2018 and earlier. FINDINGS: Portable AP semi upright view at 1320 hours. Stable left chest tunneled IJ approach dialysis catheter. Stable cardiomegaly and mediastinal contours. Lower lung volumes. Pulmonary vascularity appears stable. No pneumothorax. Mild increased bibasilar opacity with no definite consolidation or pleural effusion. No acute osseous abnormality identified. IMPRESSION: 1. Lower lung volumes with bibasilar opacity suspected due to atelectasis. 2. Stable cardiomegaly and left chest dialysis catheter. Electronically Signed   By: Genevie Ann M.D.   On: 03/08/2018 13:31    EKG: Independently reviewed.  Shows sinus tachycardia with multifocal PVCs; left bundle branch block; inferior infarct which appears old and when compared to 22nd 2019 there is no significant change.  Assessment/Plan Principal Problem:   Sepsis due to undetermined organism with metabolic encephalopathy ([place]) Active Problems:   Dyslipidemia associated with type 2 diabetes mellitus ([place])   Diabetes mellitus with diabetic nephropathy without long-term current use of insulin ([place])   Dysphagia   Foley catheter problem ([place])   Lumbar degenerative disc disease   History of DVT (deep vein thrombosis)   End-stage renal disease on hemodialysis ([name])   Anemia associated with chronic renal failure   Malnutrition of  moderate degree   S/P craniotomy   Benign essential HTN    1.  Sepsis due to undetermined organism with metabolic encephalopathy: We will continue vancomycin and cefepime as begun in the emergency department.  I am unable to give the patient significant fluids due to his hemodialysis.  I have spoken consulted nephrology for further evaluation.  Blood pressures are satisfactory at 132/80.  Will admit to telemetry monitor with sepsis protocol repeat lactic acid in 6 hours.  Last lactic acid was noted at 3.1.  2.  Dyslipidemia associate with type 2 diabetes mellitus: Continue close management.  Insulin recently discontinued due to hypoglycemic episodes.  Will monitor blood glucoses daily.  In the emergency department noted to be approximately 130s.  3.  Dysphasia: Patient with poor speech and looks like he cannot swallow.  We will ask speech therapy to evaluate him prior to feeding.  4.  Foley catheter problem: Catheter appears to be infected and not draining.  I query whether patient needs it at all.  Would recommend consideration of urology consultation for need for Foley catheter continuation.  5.  Lumbar degenerative disc disease: Patient receives close neck gel to the low back.  Will continue same.  6.  History of deep venous thrombosis: Patient with an IVC filter in place.  Not anticoagulated.  7.  End-stage renal disease on hemodialysis: Tuesdays, Thursday, Saturday, Dr. Soyla Murphy has been notified.  8.  Anemia associate with chronic renal failure: Hemoglobin is 10.6 and stable we will continue outpatient EPO in dialysis.  9.  Malnutrition of moderate degree: Continue nutritional supplementation.  10.  Status post craniotomy: Patient had a subdural hematoma.  Suspect left upper extremity weakness is related to metabolic encephalopathy and that he is exhibiting his prior symptomatology.  11.  Benign essential hypertension: Blood pressures currently stable  we will monitor closely.    DVT  prophylaxis: Lovenox Code Status: DNR Family Communication: Spoke with patient's daughter and wife were present at the time of admission Disposition Plan: Back to [facility] in likely 3 or 4 days Consults called: Dr. Jonnie Finner with nephrology Admission status: Inpatient   Lady Deutscher MD [facility] Pager [phone]  If 7PM-7AM, please contact night-coverage [email or web address] Password [facility]  03/08/2018, 4:26 PM

## 2018-03-08 NOTE — ED Notes (Signed)
Difficulty getting iv Access-IV team consulted.

## 2018-03-08 NOTE — ED Notes (Signed)
ED Provider at bedside. 

## 2018-03-08 NOTE — Progress Notes (Signed)
Pharmacy Antibiotic Note  Albert Stanley is a 72 y.o. male admitted on 03/08/2018 with possible sepsis. Pharmacy has been consulted for vancomycin dosing. Patient has already received vancomycin 1000mg  x1 and cefepime x1 in the ED. WBC WNL, temp 100.8, LA 3.33. Patient is ESRD on HD TTS.  Plan: Give additional vancomycin 1000mg  IV x1 for total of 2000mg  loading dose Then vancomycin 1000mg  IV qHD - f/u HD schedule to order F/u C&S, clinical status, renal function, de-escalation, LOT, vancomycin levels as indicated  Temp (24hrs), Avg:100.8 F (38.2 C), Min:100.8 F (38.2 C), Max:100.8 F (38.2 C)  Recent Labs  Lab 03/08/18 1319 03/08/18 1339 03/08/18 1340 03/08/18 1515  WBC 6.8  --   --   --   CREATININE 8.01* 7.90*  --   --   LATICACIDVEN  --   --  4.51* 3.33*    Estimated Creatinine Clearance: 9.9 mL/min (A) (by C-G formula based on SCr of 7.9 mg/dL (H)).    Allergies  Allergen Reactions  . Meperidine Shortness Of Breath and Other (See Comments)    Increased heart rate Reaction to demerol  . Penicillins Other (See Comments)    "Knocks me out."   PATIENT HAS HAD A PCN REACTION WITH IMMEDIATE RASH, FACIAL/TONGUE/THROAT SWELLING, SOB, OR LIGHTHEADEDNESS WITH HYPOTENSION:  # # #  YES  >> SYNCOPE ### Has patient had a PCN reaction causing severe rash involving mucus membranes or skin necrosis: unknown PATIENT HAS HAD A PCN REACTION THAT REQUIRED TREATMENT AT MD'S OFFICE Has patient had a PCN reaction occurring within the last 10 years: no   . Sulfa Antibiotics Shortness Of Breath and Other (See Comments)    Increased heart rate  . Sulfonamide Derivatives Shortness Of Breath and Other (See Comments)    TACHYCARDIA  . Lac Bovis Diarrhea, Nausea And Vomiting and Other (See Comments)    Constipation Reaction to whole milk  . Lactose Intolerance (Gi) Diarrhea, Nausea And Vomiting and Other (See Comments)    Constipation Reaction to whole milk  . Milk-Related Compounds  Diarrhea, Nausea And Vomiting and Other (See Comments)    Constipation Reaction to whole milk  . Foltx [Elfolate Plus] Other (See Comments)    UNSPECIFIED REACTION  "Pt does not recall reaction to this"  . Betadine [Povidone Iodine] Itching, Rash and Other (See Comments)    burning  . Eggs Or Egg-Derived Products Nausea And Vomiting    Reaction to egg yolks - not whites     Antimicrobials this admission: Vanc 5/28 >>  Cefepime 5/28 x1  Dose adjustments this admission: None  Microbiology results: 5/28 BCx: pending 5/28 UCx: pending  Thank you for allowing pharmacy to be a part of this patient's care.  Mila Merry Gerarda Fraction, PharmD PGY1 Pharmacy Resident Pager: [phone] 03/08/2018 4:26 PM

## 2018-03-08 NOTE — ED Notes (Signed)
Attempted report 

## 2018-03-08 NOTE — ED Notes (Signed)
MD aware of critical platelet count

## 2018-03-08 NOTE — ED Notes (Signed)
Little, MD aware of lactic acid results, Charge RN aware, pt to be placed in B18 ASAP

## 2018-03-08 NOTE — ED Triage Notes (Signed)
Patient from Dialysis. He lives at [facility] Dialysis was not done because "patient is not the same" per dialysis staff. [place] reported to EMS that patient has had a change in mental status since 5 or 6pm yesterday.

## 2018-03-08 NOTE — ED Notes (Signed)
Patient transported to CT 

## 2018-03-09 ENCOUNTER — Encounter (HOSPITAL_COMMUNITY): Payer: Self-pay

## 2018-03-09 ENCOUNTER — Other Ambulatory Visit: Payer: Self-pay

## 2018-03-09 DIAGNOSIS — T839XXD Unspecified complication of genitourinary prosthetic device, implant and graft, subsequent encounter: Secondary | ICD-10-CM

## 2018-03-09 DIAGNOSIS — Z992 Dependence on renal dialysis: Secondary | ICD-10-CM

## 2018-03-09 LAB — BASIC METABOLIC PANEL
ANION GAP: 17 — AB (ref 5–15)
BUN: 72 mg/dL — ABNORMAL HIGH (ref 6–20)
CALCIUM: 9.1 mg/dL (ref 8.9–10.3)
CHLORIDE: 91 mmol/L — AB (ref 101–111)
CO2: 27 mmol/L (ref 22–32)
CREATININE: 8.39 mg/dL — AB (ref 0.61–1.24)
GFR calc Af Amer: 6 mL/min — ABNORMAL LOW (ref >60)
GFR calc non Af Amer: 6 mL/min — ABNORMAL LOW (ref >60)
Glucose, Bld: 157 mg/dL — ABNORMAL HIGH (ref 65–99)
Potassium: 4.3 mmol/L (ref 3.5–5.1)
SODIUM: 135 mmol/L (ref 135–145)

## 2018-03-09 LAB — GLUCOSE, CAPILLARY: Glucose-Capillary: 63 mg/dL — ABNORMAL LOW (ref 65–99)

## 2018-03-09 LAB — CBC
HCT: 27.1 % — ABNORMAL LOW (ref 39.0–52.0)
HCT: 27.2 % — ABNORMAL LOW (ref 39.0–52.0)
HEMOGLOBIN: 9.6 g/dL — AB (ref 13.0–17.0)
Hemoglobin: 9.5 g/dL — ABNORMAL LOW (ref 13.0–17.0)
MCH: 28.6 pg (ref 26.0–34.0)
MCH: 27.9 pg (ref 26.0–34.0)
MCHC: 35.1 g/dL (ref 30.0–36.0)
MCHC: 35.3 g/dL (ref 30.0–36.0)
MCV: 81.6 fL (ref 78.0–100.0)
MCV: 79.1 fL (ref 78.0–100.0)
PLATELETS: 14 10*3/uL — AB (ref 150–400)
Platelets: 20 10*3/uL — CL (ref 150–400)
RBC: 3.32 MIL/uL — AB (ref 4.22–5.81)
RBC: 3.44 MIL/uL — ABNORMAL LOW (ref 4.22–5.81)
RDW: 18.7 % — ABNORMAL HIGH (ref 11.5–15.5)
RDW: 18.6 % — AB (ref 11.5–15.5)
WBC: 10.3 10*3/uL (ref 4.0–10.5)
WBC: 10.3 10*3/uL (ref 4.0–10.5)

## 2018-03-09 LAB — BLOOD CULTURE ID PANEL (REFLEXED)
ACINETOBACTER BAUMANNII: NOT DETECTED
CANDIDA ALBICANS: NOT DETECTED
CANDIDA PARAPSILOSIS: NOT DETECTED
Candida glabrata: NOT DETECTED
Candida krusei: NOT DETECTED
Candida tropicalis: NOT DETECTED
ENTEROBACTERIACEAE SPECIES: NOT DETECTED
ENTEROCOCCUS SPECIES: NOT DETECTED
Enterobacter cloacae complex: NOT DETECTED
Escherichia coli: NOT DETECTED
HAEMOPHILUS INFLUENZAE: NOT DETECTED
KLEBSIELLA OXYTOCA: NOT DETECTED
Klebsiella pneumoniae: NOT DETECTED
Listeria monocytogenes: NOT DETECTED
NEISSERIA MENINGITIDIS: NOT DETECTED
PSEUDOMONAS AERUGINOSA: NOT DETECTED
Proteus species: NOT DETECTED
SERRATIA MARCESCENS: NOT DETECTED
STAPHYLOCOCCUS AUREUS BCID: NOT DETECTED
STREPTOCOCCUS AGALACTIAE: DETECTED — AB
STREPTOCOCCUS PNEUMONIAE: NOT DETECTED
STREPTOCOCCUS PYOGENES: NOT DETECTED
Staphylococcus species: NOT DETECTED
Streptococcus species: DETECTED — AB

## 2018-03-09 LAB — MAGNESIUM: Magnesium: 2.1 mg/dL (ref 1.7–2.4)

## 2018-03-09 LAB — FIBRINOGEN: Fibrinogen: 313 mg/dL (ref 210–475)

## 2018-03-09 LAB — LACTATE DEHYDROGENASE: LDH: 267 U/L — ABNORMAL HIGH (ref 98–192)

## 2018-03-09 MED ORDER — SODIUM CHLORIDE 0.9% FLUSH: 10.0000 mL | Freq: Two times a day (BID) | INTRAVENOUS | Status: DC

## 2018-03-09 MED ORDER — CEFAZOLIN SODIUM-DEXTROSE 2-4 GM/100ML-% IV SOLN
2.0000 g | Freq: Once | INTRAVENOUS | Status: AC
  Administered 2018-03-09: 2 g via INTRAVENOUS
  Filled 2018-03-09 (×2): qty 100

## 2018-03-09 MED ORDER — VANCOMYCIN HCL IN DEXTROSE 1-5 GM/200ML-% IV SOLN
INTRAVENOUS | Status: AC
  Administered 2018-03-09: 1000 mg
  Filled 2018-03-09: qty 200

## 2018-03-09 MED ORDER — SODIUM CHLORIDE 0.9% FLUSH: 10.0000 mL | INTRAVENOUS | Status: DC | PRN

## 2018-03-09 MED ORDER — CEFAZOLIN SODIUM-DEXTROSE 1-4 GM/50ML-% IV SOLN
1.0000 g | INTRAVENOUS | Status: DC
  Administered 2018-03-10 – 2018-03-13 (×4): 1 g via INTRAVENOUS
  Filled 2018-03-09 (×5): qty 50

## 2018-03-09 MED ORDER — ACETAMINOPHEN 500 MG PO TABS
500.0000 mg | ORAL_TABLET | Freq: Four times a day (QID) | ORAL | Status: DC | PRN
  Administered 2018-03-10 – 2018-03-14 (×2): 500 mg via ORAL
  Filled 2018-03-09 (×2): qty 1

## 2018-03-09 NOTE — Procedures (Signed)
Patient remains confused, writhing about, tolerating HD well w/ one BP drop on 2.5 L goal.  Net uf will be about 1.8L today.    I was present at this dialysis session, have reviewed the session itself and made  appropriate changes Kelly Splinter MD [place] pager [phone]   03/09/2018, 12:52 PM

## 2018-03-09 NOTE — Progress Notes (Signed)
SLP Cancellation Note  Patient Details Name: Albert Stanley MRN: [id number] DOB: April 07, 1946   Cancelled treatment:       Reason Eval/Treat Not Completed: Patient at procedure or test/unavailable. Orders received for BSE. Pt currently off unit in HD. Will continue efforts.  Nyaira Hodgens B. Quentin Ore [facility], CCC-SLP Speech Language Pathologist [phone]  Shonna Chock 03/09/2018, 9:50 AM

## 2018-03-09 NOTE — Progress Notes (Addendum)
PROGRESS NOTE   Albert Stanley  CLE:[id number]    DOB: 21-Aug-1946    DOA: 03/08/2018  PCP: [name], MD   I have briefly reviewed patients previous medical records in [facility].  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 [name]), frequent hospitalizations (this is his sixth in the last 6 months), recently in 4/30-5/3 for hypoglycemia presented from HD center due to confusion and not acting himself, low-grade fever and admitted for concerns of sepsis.   Assessment & Plan:   Principal Problem:   Sepsis due to undetermined organism with metabolic encephalopathy ([place]) Active Problems:   Lumbar degenerative disc disease   History of DVT (deep vein thrombosis)   End-stage renal disease on hemodialysis ([name])   Dyslipidemia associated with type 2 diabetes mellitus ([place])   Diabetes mellitus with diabetic nephropathy without long-term current use of insulin ([name])   Anemia associated with chronic renal failure   Malnutrition of moderate degree   S/P craniotomy   Dysphagia   Benign essential HTN   Foley catheter problem ([place])   Sepsis due to group B strep (strep agalactiae) bacteremia: 1 of 2 blood cultures confirms group B strep, sensitivities pending.  Patient has indwelling Foley catheter, left TDC and AVG for HD which could be potential sources but do not look overtly infected.  Elevated lactate and procalcitonin.  CT chest suggested more atelectasis rather than pneumonia.  He had been empirically started on IV vancomycin and cefepime.  Based on culture results, discussed with pharmacy, although has listed penicillin allergy,  tolerated cefepime and hence transitioned to IV cefazolin.?  Need to remove HD catheter.  Acute toxic metabolic encephalopathy: Likely secondary to acute infection.  CT head without acute findings.  No focal deficits.  Treat underlying cause and monitor closely.  P.o. except medications until consistently awake and alert to tolerate oral diet.  Thrombocytopenia: On chart review, patient has had chronic intermittent mild thrombocytopenia in the past and coagulopathy.  He presented with platelets of 25K which is dropped to 14, INR 1.9, schistocytes noted on CBC 5/28.  I have consulted Dr. Irene Limbo, Hematology discussed with him in detail.  He suspects sepsis related DIC and antibiotics as the cause of patient's thrombocytopenia and less likely from TTP.  Recommends checking repeat CBC, LDH, haptoglobin and fibrinogen.  If platelets <20 or if bleeding, recommends transfusing 1 bag of platelets.  If bleeding, may need to consider FFP and if bleeding and low fibrinogen may also need to consider cryoprecipitate.  Discussed with RN.  ESRD on TTS HD: Nephrology consulted and underwent HD 5/29.  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  SNF unsure as to when this was last changed.  Consult urology in a.m.  History of recurrent DVT: Status post IVC filter.  NICM/chronic systolic CHF: Chronic volume overload, managed across dialysis but no overt pulmonary edema.  PVCs: Remains on amiodarone.  Chronic hypotension: Midodrine.  Blood pressure is normal today.  Type II DM with recurrent hypoglycemia: Currently CBGs okay in the 140s-150s.  Monitor closely.  Dysphagia: Speech therapy input appreciated.  N.p.o. except medications until able  to adequately evaluate based on improvement in mental status.  Anemia in ESRD: Stable.  History of SDH status post craniotomy: Repeat CT head without acute  findings.  Adult FTT: consider d/w family re Palliative consult for [place].   DVT prophylaxis: SCDs Code Status: DNR Family Communication: I discussed in detail with patient's spouse, updated care including his critical illness and overall poor long term prognosis. I offered to consult Palliative team for [place]. Disposition: To be determined   Consultants:  Neurology Hematology  Procedures:  Hemodialysis  Antimicrobials:  IV vancomycin and cefepime, discontinued. IV cefazolin   Subjective: Confused and unable to provide history this morning.  Seen at hemodialysis.  As per nursing, apart from confusion, no other acute symptoms.  No bleeding reported.  ROS: Unable.  Objective:  Vitals:   03/09/18 1100 03/09/18 1138 03/09/18 1245 03/09/18 1617  BP: 127/81 126/84 123/65 126/72  Pulse: 97 86 (!) 109 90  Resp: (!) 21 19 18 18   Temp:  97.8 F (36.6 C)  98 F (36.7 C)  TempSrc:  Axillary  Oral  SpO2: 100% 100% 100% 99%  Weight:  84.3 kg (185 lb 13.6 oz)    Height:        Examination:  General exam: Elderly male, moderately built and nourished, chronically ill looking, lying in bed, just finished HD, fidgety. Respiratory system: Clear to auscultation. Respiratory effort normal.  Left TDC site without acute findings. Cardiovascular system: S1 & S2 heard, RRR. No JVD, murmurs, rubs, gallops or clicks.  1+ pedal edema. Gastrointestinal system: Abdomen is nondistended, soft and nontender. No organomegaly or masses felt. Normal bowel sounds heard. Central nervous system: Waxing and waning alertness, not oriented, does not follow instructions. No focal neurological deficits. Extremities: Moves all extremities symmetrically.  Left upper extremity AVG without acute findings. Skin: No rashes, lesions or ulcers Psychiatry: Judgement and insight impaired. Mood & affect cannot be assessed.    Data Reviewed: I have personally reviewed following labs and imaging studies  CBC: Recent  Labs  Lab 03/08/18 1319 03/08/18 1339 03/09/18 0322  WBC 6.8  --  10.3  NEUTROABS 6.1  --   --   HGB 10.6* 13.6 9.5*  HCT 30.7* 40.0 27.1*  MCV 81.6  --  81.6  PLT 25*  --  14*   Basic Metabolic Panel: Recent Labs  Lab 03/08/18 1319 03/08/18 1339 03/09/18 0322  NA 134* 132* 135  K 4.4 4.3 4.3  CL 92* 95* 91*  CO2 24  --  27  GLUCOSE 140* 136* 157*  BUN 63* 54* 72*  CREATININE 8.01* 7.90* 8.39*  CALCIUM 9.3  --  9.1  MG  --   --  2.1   Liver Function Tests: Recent Labs  Lab 03/08/18 1319  AST 31  ALT 18  ALKPHOS 72  BILITOT 2.9*  PROT 5.5*  ALBUMIN 2.3*   Coagulation Profile: Recent Labs  Lab 03/08/18 1655  INR 1.99   Cardiac Enzymes: No results for input(s): CKTOTAL, CKMB, CKMBINDEX, TROPONINI in the last 168 hours. HbA1C: No results for input(s): HGBA1C in the last 72 hours. CBG: Recent Labs  Lab 03/08/18 1310 03/08/18 1957  GLUCAP 145* 151*    Recent Results (from the past 240 hour(s))  Culture, blood (routine x 2)     Status: Abnormal (Preliminary result)   Collection Time: 03/08/18  2:30 PM  Result Value Ref Range Status   Specimen Description BLOOD RIGHT HAND  Final   Special Requests   Final  BOTTLES DRAWN AEROBIC AND ANAEROBIC Blood Culture results may not be optimal due to an inadequate volume of blood received in culture bottles   Culture  Setup Time   Final    IN BOTH AEROBIC AND ANAEROBIC BOTTLES GRAM POSITIVE COCCI Organism ID to follow CRITICAL RESULT CALLED TO, READ BACK BY AND VERIFIED WITH: L SEAY PHARMD 03/09/18 0239 JDW    Culture (A)  Final    GROUP B STREP(S.AGALACTIAE)ISOLATED SUSCEPTIBILITIES TO FOLLOW Performed at [facility], [place] [address] [place], [place] [address]    Report Status PENDING  Incomplete  Blood Culture ID Panel (Reflexed)     Status: Abnormal   Collection Time: 03/08/18  2:30 PM  Result Value Ref Range Status   Enterococcus species NOT DETECTED NOT DETECTED Final   Listeria monocytogenes  NOT DETECTED NOT DETECTED Final   Staphylococcus species NOT DETECTED NOT DETECTED Final   Staphylococcus aureus NOT DETECTED NOT DETECTED Final   Streptococcus species DETECTED (A) NOT DETECTED Final    Comment: CRITICAL RESULT CALLED TO, READ BACK BY AND VERIFIED WITH: L SEAY PHARMD 03/09/18 0239 JDW    Streptococcus agalactiae DETECTED (A) NOT DETECTED Final    Comment: CRITICAL RESULT CALLED TO, READ BACK BY AND VERIFIED WITH: L SEAY PHARMD 03/09/18 0239 JDW    Streptococcus pneumoniae NOT DETECTED NOT DETECTED Final   Streptococcus pyogenes NOT DETECTED NOT DETECTED Final   Acinetobacter baumannii NOT DETECTED NOT DETECTED Final   Enterobacteriaceae species NOT DETECTED NOT DETECTED Final   Enterobacter cloacae complex NOT DETECTED NOT DETECTED Final   Escherichia coli NOT DETECTED NOT DETECTED Final   Klebsiella oxytoca NOT DETECTED NOT DETECTED Final   Klebsiella pneumoniae NOT DETECTED NOT DETECTED Final   Proteus species NOT DETECTED NOT DETECTED Final   Serratia marcescens NOT DETECTED NOT DETECTED Final   Haemophilus influenzae NOT DETECTED NOT DETECTED Final   Neisseria meningitidis NOT DETECTED NOT DETECTED Final   Pseudomonas aeruginosa NOT DETECTED NOT DETECTED Final   Candida albicans NOT DETECTED NOT DETECTED Final   Candida glabrata NOT DETECTED NOT DETECTED Final   Candida krusei NOT DETECTED NOT DETECTED Final   Candida parapsilosis NOT DETECTED NOT DETECTED Final   Candida tropicalis NOT DETECTED NOT DETECTED Final  Culture, blood (routine x 2)     Status: None (Preliminary result)   Collection Time: 03/08/18  3:00 PM  Result Value Ref Range Status   Specimen Description BLOOD RIGHT UPPER ARM  Final   Special Requests   Final    BOTTLES DRAWN AEROBIC AND ANAEROBIC Blood Culture adequate volume   Culture   Final    NO GROWTH < 24 HOURS Performed at [facility], [address], [place], [place] [address]    Report Status PENDING  Incomplete          Radiology Studies: Ct Head Wo Contrast  Result Date: 03/08/2018 CLINICAL DATA:  Altered level of consciousness EXAM: CT HEAD WITHOUT CONTRAST TECHNIQUE: Contiguous axial images were obtained from the base of the skull through the vertex without intravenous contrast. COMPARISON:  01/18/2018 FINDINGS: Brain: No evidence of acute infarction, hemorrhage, extra-axial collection, ventriculomegaly, or mass effect. Generalized cerebral atrophy. Periventricular white matter low attenuation likely secondary to microangiopathy. Vascular: Cerebrovascular atherosclerotic calcifications are noted. Skull: Prior right frontoparietal craniotomy. Sinuses/Orbits: Visualized portions of the orbits are unremarkable. Visualized portions of the paranasal sinuses and mastoid air cells are unremarkable. Other: None. IMPRESSION: No acute intracranial pathology. Electronically Signed   By:  Kathreen Devoid   On: 03/08/2018 14:29   Ct Chest Wo Contrast  Result Date: 03/08/2018 CLINICAL DATA:  Altered mental status and history of end-stage renal disease. EXAM: CT CHEST WITHOUT CONTRAST TECHNIQUE: Multidetector CT imaging of the chest was performed following the standard protocol without IV contrast. COMPARISON:  Chest x-ray earlier today. FINDINGS: Cardiovascular: The heart is moderately enlarged. No pericardial effusion identified. Calcified coronary artery plaque in a 3 vessel distribution. Dialysis catheter present extending to the SVC/RA junction. Mediastinum/Nodes: No enlarged mediastinal or axillary lymph nodes. Thyroid gland, trachea, and esophagus demonstrate no significant findings. Lungs/Pleura: No overt edema. Dependent posterior opacities in both lower lobes most likely represent atelectasis. Subtle bibasilar pneumonia cannot be excluded. There are trace bilateral pleural effusions. No pneumothorax or pulmonary nodule identified. Upper Abdomen: No acute abnormality. Musculoskeletal: No bony fractures or focal lesions.  Diffuse body wall edema present. IMPRESSION: 1. Bibasilar opacities most likely represent atelectasis. Subtle bibasilar pneumonia cannot be excluded. There are small bilateral pleural effusions without evidence of pulmonary edema. 2. Moderate cardiac enlargement. 3. Coronary atherosclerosis with calcified plaque in a 3 vessel distribution. 4. Dialysis catheter with tip at SVC/RA junction. 5. Diffuse body wall edema and anasarca. Electronically Signed   By: Aletta Edouard M.D.   On: 03/08/2018 18:12   Dg Chest Port 1 View  Result Date: 03/08/2018 CLINICAL DATA:  72 year old male with altered mental status. End-stage renal disease. EXAM: PORTABLE CHEST 1 VIEW COMPARISON:  02/26/2018 and earlier. FINDINGS: Portable AP semi upright view at 1320 hours. Stable left chest tunneled IJ approach dialysis catheter. Stable cardiomegaly and mediastinal contours. Lower lung volumes. Pulmonary vascularity appears stable. No pneumothorax. Mild increased bibasilar opacity with no definite consolidation or pleural effusion. No acute osseous abnormality identified. IMPRESSION: 1. Lower lung volumes with bibasilar opacity suspected due to atelectasis. 2. Stable cardiomegaly and left chest dialysis catheter. Electronically Signed   By: Genevie Ann M.D.   On: 03/08/2018 13:31        Scheduled Meds: . allopurinol  300 mg Oral Daily  . amiodarone  200 mg Oral Daily  . Chlorhexidine Gluconate Cloth  6 each Topical Q0600  . diclofenac sodium  2 g Topical TID  . feeding supplement (PRO-STAT SUGAR FREE 64)  30 mL Oral BID  . [START ON 03/10/2018] midodrine  5 mg Oral Q T,Th,Sa-HD  . oxybutynin  5 mg Oral BID  . pantoprazole  40 mg Oral QHS  . sevelamer carbonate  2,400 mg Oral TID WC  . sodium chloride flush  10-40 mL Intracatheter Q12H  . sodium chloride flush  3 mL Intravenous Q12H  . sodium chloride flush  3 mL Intravenous Q12H   Continuous Infusions: . sodium chloride    . [START ON 03/10/2018]  ceFAZolin (ANCEF) IV        LOS: 1 day     Vernell Leep, MD, FACP, [facility]. [facility] Pager [phone]  If 7PM-7AM, please contact night-coverage [email or web address] Password [facility] 03/09/2018, 5:27 PM

## 2018-03-09 NOTE — Progress Notes (Signed)
Pharmacy Antibiotic Note  Albert Stanley is a 72 y.o. male admitted on 03/08/2018 with concern for sepsis and now found to have 2 of 4 blood cultures growing GPC with BCID showing Strep agalactiae. The patient does have a PCN allergy listed but has tolerated cephalosporins previously - pharmacy consulted to narrow antibiotics to Cefazolin.  The patient normally dialyzes TTS but is noted to be off-schedule and receiving HD this morning.   Plan: - Cefazolin 2g IV x 1 after HD today to load - Start Cefazolin 1g IV every 24 hours starting on 5/30 - Will transition to HD dosing when HD schedule appears to be more consistent  - Will continue to follow HD schedule/duration, culture results, LOT, and antibiotic de-escalation plans   Temp (24hrs), Avg:98.9 F (37.2 C), Min:97.6 F (36.4 C), Max:100.8 F (38.2 C)  Recent Labs  Lab 03/08/18 1319 03/08/18 1339 03/08/18 1340 03/08/18 1515 03/08/18 1706 03/09/18 0322  WBC 6.8  --   --   --   --  10.3  CREATININE 8.01* 7.90*  --   --   --  8.39*  LATICACIDVEN  --   --  4.51* 3.33* 3.40*  --     Estimated Creatinine Clearance: 8.4 mL/min (A) (by C-G formula based on SCr of 8.39 mg/dL (H)).    Allergies  Allergen Reactions  . Meperidine Shortness Of Breath and Other (See Comments)    Increased heart rate; Reaction to demerol  . Penicillins Other (See Comments)    Has patient had a PCN reaction causing immediate rash, facial/tongue/throat swelling, SOB or lightheadedness with hypotension: Yes (syncope) Has patient had a PCN reaction causing severe rash involving mucus membranes or skin necrosis: No Has patient had a PCN reaction that required hospitalization: No (treated at MD's office) Has patient had a PCN reaction occurring within the last 10 years: No If all of the above answers are "NO", then may proceed with Cephalosporin use.  . Sulfa Antibiotics Shortness Of Breath and Other (See Comments)    Increased heart rate  . Sulfonamide  Derivatives Shortness Of Breath and Other (See Comments)    TACHYCARDIA  . Lac [place]ov[place] Diarrhea, Nausea And Vomiting and Other (See Comments)    Constipation Reaction to whole milk  . Lactose Intolerance (Gi) Diarrhea, Nausea And Vomiting and Other (See Comments)    Constipation Reaction to whole milk  . Milk-Related Compounds Diarrhea, Nausea And Vomiting and Other (See Comments)    Constipation Reaction to whole milk  . Foltx [Elfolate Plus] Other (See Comments)    UNSPECIFIED REACTION  "Pt does not recall reaction to this"  . Food Other (See Comments)    POTATOES  Unknown reaction  . Orange Juice [Orange Oil] Other (See Comments)    Unknown reaction  . Tomato Other (See Comments)    Unknown reaction   . Betadine [Povidone Iodine] Itching, Rash and Other (See Comments)    burning  . Eggs Or Egg-Derived Products Nausea And Vomiting    Reaction to egg yolks - not whites     Antimicrobials this admission: Vanc 5/28x 1 Cefepime 5/28 x1  Dose adjustments this admission: None  Microbiology results: 5/28 BCx: 2/4 GPC (BCID Strep agalactiae) 5/28 UCx >>  Thank you for allowing pharmacy to be a part of this patient's care.  Alycia Rossetti, PharmD, BCPS Clinical Pharmacist Pager: [phone] Clinical phone for 03/09/2018 from 7a-3:30p: [phone] If after 3:30p, please call main pharmacy at: [phone] 03/09/2018 10:31 AM

## 2018-03-09 NOTE — Progress Notes (Signed)
Phlebotomist called regarding STAT labs that need to be drawn.

## 2018-03-09 NOTE — Progress Notes (Signed)
Late entryNew Admission Note:   Arrival Method: stretcher Mental Orientation:  Oriented to dtr but confusion with conversation Telemetry:yes Assessment:  Skin: intact but scaly dry esp feet IV: right hand Pain:will adm med Tubes:foley Safety Measures: Admission: to be completed   5100 Orientation: Patient and daughter has been orientated to the room, unit and staff.  Family: wife not present  ll lights been placed within reach and bed alarm has been activated.   Amado Coe, RN Phone number: [phone]

## 2018-03-09 NOTE — Evaluation (Addendum)
Clinical/Bedside Swallow Evaluation Patient Details  Name: Albert Stanley MRN: [id number] Date of Birth: Feb 27, 1946  Today's Date: 03/09/2018 Time: SLP Start Time (ACUTE ONLY): 1325 SLP Stop Time (ACUTE ONLY): 1400 SLP Time Calculation (min) (ACUTE ONLY): 35 min  Past Medical History:  Past Medical History:  Diagnosis Date  . Arthritis    Osteoarthritis  . Asthma   . Bladder cancer ([place]) dx'd 1990   surg only  . CHF (congestive heart failure) ([place])   . Diabetes mellitus without complication ([place])   . ESRD (end stage renal disease) ([name])    Tues, Thurs, Sat dialysis  . GERD (gastroesophageal reflux disease)   . History of cardiac catheterization    a. LHC 5/17: no obstructive CAD  . History of DVT (deep vein thrombosis)    2003 ish  . History of nuclear stress test    a. Myoview 4/17: EF 39%, inf, inf-lat, apical inf, apical lat, apical scar, intermediate risk  . Hyperlipidemia   . Hypertension   . NICM (nonischemic cardiomyopathy) ([place])    a. Echo 2/17: mod LVH, EF 35-40%, inf-lat and inf-sept HK, mod MR, severe LAE, small pericardial effusion   Past Surgical History:  Past Surgical History:  Procedure Laterality Date  . arthroscopic knee surgery Left   . AV FISTULA PLACEMENT Left 12/13/2015   Procedure: ARTERIOVENOUS (AV) FISTULA CREATION;  Surgeon: Angelia Mould, MD;  Location: [facility] OR;  Service: Vascular;  Laterality: Left;  . AV FISTULA PLACEMENT Left 12/15/2017   Procedure: INSERTION OF ARTERIOVENOUS (AV) GORE-TEX GRAFT ARM;  Surgeon: Serafina Mitchell, MD;  Location: [place];  Service: Vascular;  Laterality: Left;  . BACK SURGERY     2 times  1960  . CARDIAC CATHETERIZATION N/A 02/28/2016   Procedure: Left Heart Cath and Coronary Angiography;  Surgeon: Larey Dresser, MD;  Location: [place] [facility];  Service: Cardiovascular;  Laterality: N/A;  . CRANIOTOMY N/A 12/05/2017   Procedure: CRANIOTOMY HEMATOMA EVACUATION SUBDURAL;  Surgeon: Ashok Pall, MD;  Location:  [place];  Service: Neurosurgery;  Laterality: N/A;  . CYSTOSCOPY    . FISTULA SUPERFICIALIZATION Left 08/14/2016   Procedure: FISTULA SUPERFICIALIZATION LEFT UPPER ARM;  Surgeon: Angelia Mould, MD;  Location: [place];  Service: Vascular;  Laterality: Left;  . INSERTION OF DIALYSIS CATHETER N/A 12/13/2015   Procedure: INSERTION OF DIALYSIS CATHETER;  Surgeon: Angelia Mould, MD;  Location: [place];  Service: Vascular;  Laterality: N/A;  . INSERTION OF DIALYSIS CATHETER Left 05/07/2017   Procedure: INSERTION OF DIALYSIS CATHETER LEFT INTERNAL JUGULAR PLACEMENT;  Surgeon: Angelia Mould, MD;  Location: [place];  Service: Vascular;  Laterality: Left;  . INSERTION OF DIALYSIS CATHETER Left 12/15/2017   Procedure: INSERTION OF DIALYSIS CATHETER;  Surgeon: Serafina Mitchell, MD;  Location: [facility] OR;  Service: Vascular;  Laterality: Left;  . IR IVC FILTER PLMT / S&I Burke Keels GUID/MOD SED  12/18/2017  . IR RADIOLOGIST EVAL & MGMT  05/12/2017  . JOINT REPLACEMENT Left    hip  . LIGATION OF COMPETING BRANCHES OF ARTERIOVENOUS FISTULA Left 08/14/2016   Procedure: LIGATION OF COMPETING BRANCHES OF ARTERIOVENOUS FISTULA LEFT UPPER ARM;  Surgeon: Angelia Mould, MD;  Location: [place];  Service: Vascular;  Laterality: Left;  . PERIPHERAL VASCULAR CATHETERIZATION Left 06/09/2016   Procedure: Fistulagram;  Surgeon: Serafina Mitchell, MD;  Location: [place] [facility];  Service: Cardiovascular;  Laterality: Left;  ARM  . PERIPHERAL VASCULAR CATHETERIZATION Left 06/09/2016   Procedure:  Peripheral Vascular Balloon Angioplasty;  Surgeon: Serafina Mitchell, MD;  Location: [place] [facility];  Service: Cardiovascular;  Laterality: Left;  upper arm venous  . PERIPHERAL VASCULAR CATHETERIZATION Left 06/25/2016   Procedure: Fistulagram;  Surgeon: Serafina Mitchell, MD;  Location: [place] [facility];  Service: Cardiovascular;  Laterality: Left;  . REVISON OF ARTERIOVENOUS FISTULA Left 05/07/2017   Procedure: REPAIR OF  PSEUDOANEURYSM  LEFT BRACHIO-CEPHALIC  ARM ARTERIOVENOUS FISTULA;  Surgeon: Angelia Mould, MD;  Location: [facility] OR;  Service: Vascular;  Laterality: Left;   HPI:  72 year old male admitted 03/08/18 PMH: DM, ESRD, GERD, HLD, HTN   Assessment / Plan / Recommendation Clinical Impression  RN contacted SLP, indicating pt back from HD and appropriate for evaluation. Upon arrival of SLP, pt was noted to be resting in bed, leaning to the left. Wife and RN present. Wife indicated pt was tolerating regular solids and thin liquids prior to admit, and was scheduled for oral surgery for infection in upper right dentition. Oral care was completed to minimize bacterial load.   Pt accepted trials of ice chips, puree, and thin liquids. Decreased oral awareness of bolus was noted on all consistencies, with verbal and tactile cues required throughout the evaluation. Swallow reflex is suspected to be delayed, although laryngeal elevation appears adequate per palpation. At this time, recommend NPO status except for critical meds, crushed if possible. SLP will continue to follow for readiness for po intake, once pt is more alert and less confused. MD, RN and wife were informed of this recommendation and plan.    SLP Visit Diagnosis: Dysphagia, unspecified (R13.10)    Aspiration Risk  Severe aspiration risk    Diet Recommendation NPO   Medication Administration: Crushed with puree    Other  Recommendations Oral Care Recommendations: Oral care QID(ongoing oral infection)   Follow up Recommendations 24 hour supervision/assistance;Skilled Nursing facility      Frequency and Duration min 2x/week  1 week;2 weeks       Prognosis Prognosis for Safe Diet Advancement: Fair Barriers to Reach Goals: Cognitive deficits      Swallow Study   General Date of Onset: 03/08/18 HPI: 72 year old male admitted 03/08/18 PMH: DM, ESRD, GERD, HLD, HTN Type of Study: Bedside Swallow Evaluation Previous Swallow  Assessment: BSE in February and March 2019, advanced to reg/thin Diet Prior to this Study: NPO Temperature Spikes Noted: No Respiratory Status: Nasal cannula History of Recent Intubation: No Behavior/Cognition: Confused;Lethargic/Drowsy;Doesn't follow directions;Requires cueing Oral Cavity Assessment: Within Functional Limits(Pt with infection upper right jaw) Oral Care Completed by SLP: Yes Oral Cavity - Dentition: Poor condition;Missing dentition Vision: Impaired for self-feeding Self-Feeding Abilities: Total assist Patient Positioning: Upright in bed Baseline Vocal Quality: Low vocal intensity Volitional Cough: Cognitively unable to elicit Volitional Swallow: Unable to elicit    Oral/Motor/Sensory Function Overall Oral Motor/Sensory Function: Generalized oral weakness   Ice Chips Ice chips: Impaired Oral Phase Impairments: Poor awareness of bolus;Reduced lingual movement/coordination Pharyngeal Phase Impairments: Suspected delayed Swallow   Thin Liquid Thin Liquid: Impaired Presentation: Straw Oral Phase Impairments: Poor awareness of bolus Pharyngeal  Phase Impairments: Suspected delayed Swallow    Nectar Thick Nectar Thick Liquid: Not tested   Honey Thick Honey Thick Liquid: Not tested   Puree Puree: Impaired Presentation: Spoon Oral Phase Impairments: Reduced labial seal;Poor awareness of bolus;Reduced lingual movement/coordination Oral Phase Functional Implications: Prolonged oral transit;Oral residue Pharyngeal Phase Impairments: Suspected delayed Swallow   Solid   GO   Solid:  Not tested       Zarina Pe B. Quentin Ore, [facility], [place] Speech Language Pathologist [phone]  Shonna Chock 03/09/2018,2:14 PM

## 2018-03-09 NOTE — Progress Notes (Signed)
PHARMACY - PHYSICIAN COMMUNICATION CRITICAL VALUE ALERT - BLOOD CULTURE IDENTIFICATION (BCID)   Albert Stanley is an 72 y.o. male who presented to [facility] on 03/08/2018 with a chief complaint of AMS and concern for sepsis.  Assessment: 76 YOM started on empiric antibiotics for rule out sepsis - now found to have Group B Strep (Strep agalactiae) on BCID in 2/4 blood cultures. It is noted that the patient has a indwelling foley and TDC/AVG for HD access which should be evaluated for potential sources.   Name of physician (or Provider) Contacted: Hongalgi  Current antibiotics: Vancomycin   Changes to prescribed antibiotics recommended: The patient has a PCN allergy listed but has tolerated cephalosporins previously. Discussed with MD and okay with transitioning to Cefazolin.   Results for orders placed or performed during the hospital encounter of 03/08/18  Blood Culture ID Panel (Reflexed) (Collected: 03/08/2018  2:30 PM)  Result Value Ref Range   Enterococcus species NOT DETECTED NOT DETECTED   Listeria monocytogenes NOT DETECTED NOT DETECTED   Staphylococcus species NOT DETECTED NOT DETECTED   Staphylococcus aureus NOT DETECTED NOT DETECTED   Streptococcus species DETECTED (A) NOT DETECTED   Streptococcus agalactiae DETECTED (A) NOT DETECTED   Streptococcus pneumoniae NOT DETECTED NOT DETECTED   Streptococcus pyogenes NOT DETECTED NOT DETECTED   Acinetobacter baumannii NOT DETECTED NOT DETECTED   Enterobacteriaceae species NOT DETECTED NOT DETECTED   Enterobacter cloacae complex NOT DETECTED NOT DETECTED   Escherichia coli NOT DETECTED NOT DETECTED   Klebsiella oxytoca NOT DETECTED NOT DETECTED   Klebsiella pneumoniae NOT DETECTED NOT DETECTED   Proteus species NOT DETECTED NOT DETECTED   Serratia marcescens NOT DETECTED NOT DETECTED   Haemophilus influenzae NOT DETECTED NOT DETECTED   Neisseria meningitidis NOT DETECTED NOT DETECTED   Pseudomonas aeruginosa NOT DETECTED NOT  DETECTED   Candida albicans NOT DETECTED NOT DETECTED   Candida glabrata NOT DETECTED NOT DETECTED   Candida krusei NOT DETECTED NOT DETECTED   Candida parapsilosis NOT DETECTED NOT DETECTED   Candida tropicalis NOT DETECTED NOT DETECTED    Lawson Radar 03/09/2018  9:36 AM

## 2018-03-09 NOTE — Progress Notes (Signed)
[place] contacted regarding patients foley catheter, specifically when it was placed and who changes it. RN at [facility] stated that patient has only been at the facility for approximately three weeks and that he was admitted with the foley catheter in place. RN also stated that she does not have order to change catheter. Hongalgi, MD notified of this information. Will continue to monitor.

## 2018-03-09 NOTE — Progress Notes (Signed)
Patients L Arm is swollen and red under AVF site after returning from HD earlier today. Arm is beginning to become more swollen. Mel, Rapid Response at bedside and AVF assessed. Mel, RN stated to call HD RN to have them come and assess. Almyra Brace, HD RN notified. Dain, RN stated that she would come assess patient. Will continue to monitor.

## 2018-03-10 DIAGNOSIS — I10 Essential (primary) hypertension: Secondary | ICD-10-CM

## 2018-03-10 DIAGNOSIS — N186 End stage renal disease: Secondary | ICD-10-CM

## 2018-03-10 DIAGNOSIS — Z87891 Personal history of nicotine dependence: Secondary | ICD-10-CM

## 2018-03-10 DIAGNOSIS — R4182 Altered mental status, unspecified: Secondary | ICD-10-CM

## 2018-03-10 DIAGNOSIS — G92 Toxic encephalopathy: Secondary | ICD-10-CM

## 2018-03-10 DIAGNOSIS — Z992 Dependence on renal dialysis: Secondary | ICD-10-CM

## 2018-03-10 DIAGNOSIS — R1310 Dysphagia, unspecified: Secondary | ICD-10-CM

## 2018-03-10 DIAGNOSIS — R627 Adult failure to thrive: Secondary | ICD-10-CM

## 2018-03-10 LAB — CBC WITH DIFFERENTIAL/PLATELET
BASOS ABS: 0 10*3/uL (ref 0.0–0.1)
Band Neutrophils: 31 %
Basophils Relative: 0 %
Blasts: 0 %
Eosinophils Absolute: 0.2 10*3/uL (ref 0.0–0.7)
Eosinophils Relative: 2 %
HEMATOCRIT: 27.8 % — AB (ref 39.0–52.0)
HEMOGLOBIN: 9.8 g/dL — AB (ref 13.0–17.0)
LYMPHS PCT: 10 %
Lymphs Abs: 1.1 10*3/uL (ref 0.7–4.0)
MCH: 28.2 pg (ref 26.0–34.0)
MCHC: 35.3 g/dL (ref 30.0–36.0)
MCV: 79.9 fL (ref 78.0–100.0)
MONOS PCT: 16 %
Metamyelocytes Relative: 1 %
Monocytes Absolute: 1.8 10*3/uL — ABNORMAL HIGH (ref 0.1–1.0)
Myelocytes: 0 %
NEUTROS ABS: 8 10*3/uL — AB (ref 1.7–7.7)
Neutrophils Relative %: 40 %
OTHER: 0 %
PROMYELOCYTES RELATIVE: 0 %
Platelets: 13 10*3/uL — CL (ref 150–400)
RBC: 3.48 MIL/uL — AB (ref 4.22–5.81)
RDW: 18.8 % — ABNORMAL HIGH (ref 11.5–15.5)
WBC: 11.1 10*3/uL — AB (ref 4.0–10.5)
nRBC: 0 /100 WBC

## 2018-03-10 LAB — GLUCOSE, CAPILLARY
GLUCOSE-CAPILLARY: 102 mg/dL — AB (ref 65–99)
GLUCOSE-CAPILLARY: 77 mg/dL (ref 65–99)
GLUCOSE-CAPILLARY: 92 mg/dL (ref 65–99)
Glucose-Capillary: 45 mg/dL — ABNORMAL LOW (ref 65–99)
Glucose-Capillary: 77 mg/dL (ref 65–99)

## 2018-03-10 LAB — COMPREHENSIVE METABOLIC PANEL
ALBUMIN: 1.9 g/dL — AB (ref 3.5–5.0)
ALK PHOS: 58 U/L (ref 38–126)
ALT: 17 U/L (ref 17–63)
ANION GAP: 18 — AB (ref 5–15)
AST: 33 U/L (ref 15–41)
BILIRUBIN TOTAL: 3.1 mg/dL — AB (ref 0.3–1.2)
BUN: 36 mg/dL — AB (ref 6–20)
CALCIUM: 9 mg/dL (ref 8.9–10.3)
CO2: 28 mmol/L (ref 22–32)
CREATININE: 5.36 mg/dL — AB (ref 0.61–1.24)
Chloride: 92 mmol/L — ABNORMAL LOW (ref 101–111)
GFR calc Af Amer: 11 mL/min — ABNORMAL LOW (ref >60)
GFR calc non Af Amer: 10 mL/min — ABNORMAL LOW (ref >60)
GLUCOSE: 47 mg/dL — AB (ref 65–99)
Potassium: 3.8 mmol/L (ref 3.5–5.1)
SODIUM: 138 mmol/L (ref 135–145)
TOTAL PROTEIN: 5.2 g/dL — AB (ref 6.5–8.1)

## 2018-03-10 LAB — CULTURE, BLOOD (ROUTINE X 2)

## 2018-03-10 LAB — CBC
HEMATOCRIT: 26.1 % — AB (ref 39.0–52.0)
Hemoglobin: 9.2 g/dL — ABNORMAL LOW (ref 13.0–17.0)
MCH: 28 pg (ref 26.0–34.0)
MCHC: 35.2 g/dL (ref 30.0–36.0)
MCV: 79.6 fL (ref 78.0–100.0)
PLATELETS: 22 10*3/uL — AB (ref 150–400)
RBC: 3.28 MIL/uL — ABNORMAL LOW (ref 4.22–5.81)
RDW: 19.1 % — ABNORMAL HIGH (ref 11.5–15.5)
WBC: 10 10*3/uL (ref 4.0–10.5)

## 2018-03-10 LAB — SAVE SMEAR

## 2018-03-10 MED ORDER — DEXTROSE 50 % IV SOLN
INTRAVENOUS | Status: AC
  Administered 2018-03-10: 50 mL
  Filled 2018-03-10: qty 50

## 2018-03-10 MED ORDER — CHLORHEXIDINE GLUCONATE CLOTH 2 % EX PADS: 6.0000 | MEDICATED_PAD | CUTANEOUS | Status: DC | PRN

## 2018-03-10 MED ORDER — LIDOCAINE HCL (PF) 2 % IJ SOLN: 0.0000 mL | Freq: Once | INTRAMUSCULAR | Status: DC | PRN

## 2018-03-10 MED ORDER — SODIUM CHLORIDE 0.9 % IV SOLN: Freq: Once | INTRAVENOUS | Status: DC

## 2018-03-10 NOTE — Consult Note (Signed)
   [facility] Inpatient Consult   03/10/2018  Albert Stanley 10-18-1945 [id number]  Patient assessed for multiple admissions and extreme high risk score for unplanned readmission in the [place].  Met with wife briefly who was on her way out regarding any ongoing barriers or needs.  She states patient is coming in from [facility] with a HX of acting differently after his Hemodialysis.  Patient's primary care provider is Dr. [name].   Wife states she would like information again for [facility].  A brochure, 24 hour nurse advise line and contact information given.  She verbalize concerns that, "at this point I am not sure where he'll end up."  She did not elaborate as she was on her way out.  Encouraged her to follow up.  She verbalized she would reach out if needed.  For questions contact:   Natividad Brood, RN BSN [facility] Liaison  [phone] business mobile phone Toll free office [phone]

## 2018-03-10 NOTE — Progress Notes (Signed)
HEMATOLOGY/ONCOLOGY CONSULTATION NOTE  Date of Service: 03/10/2018  Patient Care Team: [name], MD as PCP - General (Internal Medicine) Center, [facility] Larey Dresser, MD as Consulting Physician (Cardiology) [place], Jeneen Rinks, MD as Consulting Physician (Nephrology) Center, [facility]  CHIEF COMPLAINTS/PURPOSE OF CONSULTATION:  Sepsis with thrombocytopenia   HISTORY OF PRESENTING ILLNESS:   Albert Stanley is a wonderful 72 y.o. male who has been referred to [place] by Dr Algis Liming for evaluation and management of his thrombocytopenia. He is accompanied today by his daughter.   Patient has multiple medical co-morbidities such as hypertension, dyslipidemia, end-stage renal disease on dialysis, history of previous bladder cancer, systolic CHF, history of DVT, urinary incontinence with indwelling chronic catheter -urology follow-up with Dr. [name].  He also has chronic hypotension on midodrine. Patient has been in very poor health overall and currently lives in a skilled nursing facility.  He has had issues with poor p.o. intake and multiple ED visits. He has also had issues with falls and was in the hospital in March 2019 with a traumatic right subdural hematoma requiring a craniotomy.  This was complicated with a right femoral and right posterior tibial DVT status post IVC filter placement on December 18, 2017. He has a history of being MRSA positive.  The pt presented to the ED on 03/08/18 with his family's concern for his mental status, noting that he was not acting like himself, accompanied by a low-grade fever.  He has had a catheter in place for about 2 years. The patient's daughter notes that he has had a tooth infection recently, for which he was planning on seeing a dentist.  He was noted to have blood cultures positive for group B streptococcus. Patient came in with his chronic anemia with acute on chronic thrombocytopenia.  His platelet counts were 120k  about 4 weeks ago and 88k on admission in the setting of group B streptococcal sepsis.  He had an overtly elevated procalcitonin level consistent with sepsis.  Patient was being evaluated for sources of sepsis including his indwelling Foley's catheter , transdermal catheter ,AV graft for hemodialysis.  He was treated with vancomycin and cefepime empirically and then was switched to cefazolin.  CT chest on 03/08/2018 could not rule out a pneumonia.  His platelet counts progressively dropped in the setting of sepsis and antibiotic use down to 10-20k range.  He was also noted to be coagulopathic with APTT of 43 and prothrombin time of 22.4. He was noted to have slightly increased schistocytes on his peripheral blood smear and target cells and significant anisopoikilocytosis LDH level borderline.  Elevated but haptoglobin within normal limits suggesting against overt hemolysis. Discussed with hospitalist this is unlikely to be TTP and is most consistent with likely DIC in the setting of sepsis.  Thrombocytopenia is also partially due to multiple antibiotics and the sepsis itself.  Patient is a poor historian but daughter notes no overt evidence of bleeding.   The patient's daughter notes that his bladder cancer has not been an issue in several years.  He notes that he has had some diarrhea today.   On review of systems, pt reports that he has diarrhea, and denies headaches, difficulty breathing, problems bleeding, abdominal pains, and any other symptoms.   On PMHx the pt reports end-stage renal disease, chronic hypotension, DM type II, chronic systolic CHF, bladder cancer s/p resection.   MEDICAL HISTORY:  Past Medical History:  Diagnosis Date  . Arthritis    Osteoarthritis  .  Asthma   . Bladder cancer ([place]) dx'd 1990   surg only  . CHF (congestive heart failure) ([place])   . Diabetes mellitus without complication ([place])   . ESRD (end stage renal disease) ([name])    Tues, Thurs, Sat dialysis  .  GERD (gastroesophageal reflux disease)   . History of cardiac catheterization    a. LHC 5/17: no obstructive CAD  . History of DVT (deep vein thrombosis)    2003 ish  . History of nuclear stress test    a. Myoview 4/17: EF 39%, inf, inf-lat, apical inf, apical lat, apical scar, intermediate risk  . Hyperlipidemia   . Hypertension   . NICM (nonischemic cardiomyopathy) ([place])    a. Echo 2/17: mod LVH, EF 35-40%, inf-lat and inf-sept HK, mod MR, severe LAE, small pericardial effusion    SURGICAL HISTORY: Past Surgical History:  Procedure Laterality Date  . arthroscopic knee surgery Left   . AV FISTULA PLACEMENT Left 12/13/2015   Procedure: ARTERIOVENOUS (AV) FISTULA CREATION;  Surgeon: Angelia Mould, MD;  Location: [facility] OR;  Service: Vascular;  Laterality: Left;  . AV FISTULA PLACEMENT Left 12/15/2017   Procedure: INSERTION OF ARTERIOVENOUS (AV) GORE-TEX GRAFT ARM;  Surgeon: Serafina Mitchell, MD;  Location: [place];  Service: Vascular;  Laterality: Left;  . BACK SURGERY     2 times  1960  . CARDIAC CATHETERIZATION N/A 02/28/2016   Procedure: Left Heart Cath and Coronary Angiography;  Surgeon: Larey Dresser, MD;  Location: [place] [facility];  Service: Cardiovascular;  Laterality: N/A;  . CRANIOTOMY N/A 12/05/2017   Procedure: CRANIOTOMY HEMATOMA EVACUATION SUBDURAL;  Surgeon: Ashok Pall, MD;  Location: [place];  Service: Neurosurgery;  Laterality: N/A;  . CYSTOSCOPY    . FISTULA SUPERFICIALIZATION Left 08/14/2016   Procedure: FISTULA SUPERFICIALIZATION LEFT UPPER ARM;  Surgeon: Angelia Mould, MD;  Location: [place];  Service: Vascular;  Laterality: Left;  . INSERTION OF DIALYSIS CATHETER N/A 12/13/2015   Procedure: INSERTION OF DIALYSIS CATHETER;  Surgeon: Angelia Mould, MD;  Location: [place];  Service: Vascular;  Laterality: N/A;  . INSERTION OF DIALYSIS CATHETER Left 05/07/2017   Procedure: INSERTION OF DIALYSIS CATHETER LEFT INTERNAL JUGULAR PLACEMENT;  Surgeon: Angelia Mould, MD;  Location: [place];  Service: Vascular;  Laterality: Left;  . INSERTION OF DIALYSIS CATHETER Left 12/15/2017   Procedure: INSERTION OF DIALYSIS CATHETER;  Surgeon: Serafina Mitchell, MD;  Location: [facility];  Service: Vascular;  Laterality: Left;  . IR IVC FILTER PLMT / S&I Burke Keels GUID/MOD SED  12/18/2017  . IR RADIOLOGIST EVAL & MGMT  05/12/2017  . JOINT REPLACEMENT Left    hip  . LIGATION OF COMPETING BRANCHES OF ARTERIOVENOUS FISTULA Left 08/14/2016   Procedure: LIGATION OF COMPETING BRANCHES OF ARTERIOVENOUS FISTULA LEFT UPPER ARM;  Surgeon: Angelia Mould, MD;  Location: [place];  Service: Vascular;  Laterality: Left;  . PERIPHERAL VASCULAR CATHETERIZATION Left 06/09/2016   Procedure: Fistulagram;  Surgeon: Serafina Mitchell, MD;  Location: [place] [facility];  Service: Cardiovascular;  Laterality: Left;  ARM  . PERIPHERAL VASCULAR CATHETERIZATION Left 06/09/2016   Procedure: Peripheral Vascular Balloon Angioplasty;  Surgeon: Serafina Mitchell, MD;  Location: [place] [facility];  Service: Cardiovascular;  Laterality: Left;  upper arm venous  . PERIPHERAL VASCULAR CATHETERIZATION Left 06/25/2016   Procedure: Fistulagram;  Surgeon: Serafina Mitchell, MD;  Location: [place] [facility];  Service: Cardiovascular;  Laterality: Left;  . REVISON OF ARTERIOVENOUS FISTULA  Left 05/07/2017   Procedure: REPAIR OF PSEUDOANEURYSM  LEFT BRACHIO-CEPHALIC  ARM ARTERIOVENOUS FISTULA;  Surgeon: Angelia Mould, MD;  Location: [facility] OR;  Service: Vascular;  Laterality: Left;    SOCIAL HISTORY: Social History   Socioeconomic History  . Marital status: Married    Spouse name: Not on file  . Number of children: Not on file  . Years of education: Not on file  . Highest education level: Not on file  Occupational History  . Occupation: Retired  Scientific laboratory technician  . Financial resource strain: Not on file  . Food insecurity:    Worry: Not on file    Inability: Not on file  . Transportation needs:     Medical: Not on file    Non-medical: Not on file  Tobacco Use  . Smoking status: Former Smoker    Years: 30.00    Last attempt to quit: 10/12/1998    Years since quitting: 19.4  . Smokeless tobacco: Never Used  Substance and Sexual Activity  . Alcohol use: No    Alcohol/week: 0.0 oz    Comment: Used to drink on the weekends, quit 2 years ago  . Drug use: No  . Sexual activity: Not Currently  Lifestyle  . Physical activity:    Days per week: Not on file    Minutes per session: Not on file  . Stress: Not on file  Relationships  . Social connections:    Talks on phone: Not on file    Gets together: Not on file    Attends religious service: Not on file    Active member of club or organization: Not on file    Attends meetings of clubs or organizations: Not on file    Relationship status: Not on file  . Intimate partner violence:    Fear of current or ex partner: Not on file    Emotionally abused: Not on file    Physically abused: Not on file    Forced sexual activity: Not on file  Other Topics Concern  . Not on file  Social History Narrative   Lives with wife and daughter.    FAMILY HISTORY: Family History  Problem Relation Age of Onset  . Arthritis Mother   . Cancer Father   . Heart disease Father   . Hypertension Sister   . Alcohol abuse Brother   . Diabetes Daughter   . Heart attack Paternal Uncle     ALLERGIES:  is allergic to meperidine; penicillins; sulfa antibiotics; sulfonamide derivatives; lac bovis; lactose intolerance (gi); milk-related compounds; foltx [elfolate plus]; food; orange juice [orange oil]; tomato; betadine [povidone iodine]; and eggs or egg-derived products.  MEDICATIONS:  Current Facility-Administered Medications  Medication Dose Route Frequency Provider Last Rate Last Dose  . 0.9 %  sodium chloride infusion  250 mL Intravenous PRN Lady Deutscher, MD      . 0.9 %  sodium chloride infusion   Intravenous Once Hongalgi, Anand D, MD      .  0.9 %  sodium chloride infusion   Intravenous Once Vernell Leep D, MD      . acetaminophen (TYLENOL) tablet 500 mg  500 mg Oral Q6H PRN Modena Jansky, MD   500 mg at 03/10/18 2252  . allopurinol (ZYLOPRIM) tablet 300 mg  300 mg Oral Daily Lady Deutscher, MD   300 mg at 03/10/18 1043  . amiodarone (PACERONE) tablet 200 mg  200 mg Oral Daily Lady Deutscher, MD   200  mg at 03/10/18 1043  . [START ON March 18, 2018] calcitRIOL (ROCALTROL) capsule 2 mcg  2 mcg Oral Q T,Th,Sa-HD Alric Seton, PA-C      . ceFAZolin (ANCEF) IVPB 1 g/50 mL premix  1 g Intravenous Q24H Rolla Flatten, [facility]   Stopped at 03/10/18 2200  . Chlorhexidine Gluconate Cloth 2 % PADS 6 each  6 each Topical Q dialysis Roney Jaffe, MD      . Chlorhexidine Gluconate Cloth 2 % PADS 6 each  6 each Topical Q0600 Alric Seton, PA-C      . [START ON 03/17/2018] Darbepoetin Alfa (ARANESP) injection 200 mcg  200 mcg Intravenous Q Thu-HD Alric Seton, PA-C      . diclofenac sodium (VOLTAREN) 1 % transdermal gel 2 g  2 g Topical TID Lady Deutscher, MD   2 g at 03/10/18 2253  . feeding supplement (PRO-STAT SUGAR FREE 64) liquid 30 mL  30 mL Oral BID Lady Deutscher, MD   30 mL at 03/10/18 2252  . midodrine (PROAMATINE) tablet 5 mg  5 mg Oral Q T,Th,Sa-HD Lady Deutscher, MD   5 mg at 03/10/18 1415  . ondansetron (ZOFRAN) tablet 4 mg  4 mg Oral Q6H PRN Lady Deutscher, MD       Or  . ondansetron [facility]) injection 4 mg  4 mg Intravenous Q6H PRN Lady Deutscher, MD      . oxybutynin (DITROPAN) tablet 5 mg  5 mg Oral BID Lady Deutscher, MD   5 mg at 03/10/18 2252  . pantoprazole (PROTONIX) EC tablet 40 mg  40 mg Oral QHS Lady Deutscher, MD   40 mg at 03/10/18 2253  . phytonadione (VITAMIN K) tablet 5 mg  5 mg Oral Daily Hongalgi, Anand D, MD      . sevelamer carbonate (RENVELA) tablet 2,400 mg  2,400 mg Oral TID WC Lady Deutscher, MD      . sodium chloride flush (NS) 0.9 % injection 10-40 mL  10-40  mL Intracatheter Q12H Lady Deutscher, MD      . sodium chloride flush (NS) 0.9 % injection 10-40 mL  10-40 mL Intracatheter PRN Lady Deutscher, MD      . sodium chloride flush (NS) 0.9 % injection 3 mL  3 mL Intravenous Q12H Lady Deutscher, MD      . sodium chloride flush (NS) 0.9 % injection 3 mL  3 mL Intravenous Q12H Lady Deutscher, MD   3 mL at 03/10/18 2253  . sodium chloride flush (NS) 0.9 % injection 3 mL  3 mL Intravenous PRN Lady Deutscher, MD        REVIEW OF SYSTEMS:    10 Point review of Systems was done is negative except as noted above.  PHYSICAL EXAMINATION: ECOG PERFORMANCE STATUS: 3 - Symptomatic, >50% confined to bed  . Vitals:   03/10/18 1145 03/10/18 1415  BP: 98/66 105/74  Pulse: 74 79  Resp: 16 18  Temp: 98.2 F (36.8 C) 98.1 F (36.7 C)  SpO2: 100% 100%   Filed Weights   03/09/18 0736 03/09/18 1138 03/09/18 2125  Weight: 191 lb 5.8 oz (86.8 kg) 185 lb 13.6 oz (84.3 kg) 185 lb 3 oz (84 kg)   .Body mass index is 29 kg/m.  GENERAL: confused but verbal and alert (a little better per daughter compared to during admission) SKIN: no acute rashes EYES: conjunctiva are pink and non-injected, sclera anicteric OROPHARYNX: MMM, no exudates NECK:  supple, + JVD LYMPH:  no palpable lymphadenopathy in the cervical, axillary or inguinal regions LUNGS: b/l basal rales HEART: regular rate & rhythm ABDOMEN:  normoactive bowel sounds , non tender, not distended. Extremity: 2+ pedal edema NEURO: moving all 4 extremities  LABORATORY DATA:  I have reviewed the data as listed  . CBC Latest Ref Rng & Units 03/11/2018 03/10/2018 03/10/2018  WBC 4.0 - 10.5 K/uL 10.1 10.0 11.1(H)  Hemoglobin 13.0 - 17.0 g/dL 9.3(L) 9.2(L) 9.8(L)  Hematocrit 39.0 - 52.0 % 26.1(L) 26.1(L) 27.8(L)  Platelets 150 - 400 K/uL 19(LL) 22(LL) 13(LL)    . CMP Latest Ref Rng & Units 03/11/2018 03/10/2018 03/09/2018  Glucose 65 - 99 mg/dL 89 47(L) 157(H)  BUN 6 - 20 mg/dL 51(H)  36(H) 72(H)  Creatinine 0.61 - 1.24 mg/dL 6.33(H) 5.36(H) 8.39(H)  Sodium 135 - 145 mmol/L 137 138 135  Potassium 3.5 - 5.1 mmol/L 4.1 3.8 4.3  Chloride 101 - 111 mmol/L 94(L) 92(L) 91(L)  CO2 22 - 32 mmol/L 29 28 27   Calcium 8.9 - 10.3 mg/dL 8.8(L) 9.0 9.1  Total Protein 6.5 - 8.1 g/dL 4.7(L) 5.2(L) -  Total Bilirubin 0.3 - 1.2 mg/dL 3.1(H) 3.1(H) -  Alkaline Phos 38 - 126 U/L 65 58 -  AST 15 - 41 U/L 32 33 -  ALT 17 - 63 U/L 11(L) 17 -     RADIOGRAPHIC STUDIES: I have personally reviewed the radiological images as listed and agreed with the findings in the report. Dg Chest 2 View  Result Date: 02/26/2018 CLINICAL DATA:  Missing dialysis point.  Shortness of breath. EXAM: CHEST - 2 VIEW COMPARISON:  02/03/2018 and 12/24/2017 FINDINGS: Left IJ central venous catheter unchanged with tip over the cavoatrial junction. Lungs are adequately inflated without focal airspace consolidation or effusion. Moderate stable cardiomegaly. Remainder of the exam is unchanged. IMPRESSION: No acute cardiopulmonary disease. Moderate stable cardiomegaly. Left IJ dialysis catheter unchanged. Electronically Signed   By: Marin Olp M.D.   On: 02/26/2018 15:08   Ct Head Wo Contrast  Result Date: 03/08/2018 CLINICAL DATA:  Altered level of consciousness EXAM: CT HEAD WITHOUT CONTRAST TECHNIQUE: Contiguous axial images were obtained from the base of the skull through the vertex without intravenous contrast. COMPARISON:  01/18/2018 FINDINGS: Brain: No evidence of acute infarction, hemorrhage, extra-axial collection, ventriculomegaly, or mass effect. Generalized cerebral atrophy. Periventricular white matter low attenuation likely secondary to microangiopathy. Vascular: Cerebrovascular atherosclerotic calcifications are noted. Skull: Prior right frontoparietal craniotomy. Sinuses/Orbits: Visualized portions of the orbits are unremarkable. Visualized portions of the paranasal sinuses and mastoid air cells are  unremarkable. Other: None. IMPRESSION: No acute intracranial pathology. Electronically Signed   By: Kathreen Devoid   On: 03/08/2018 14:29   Ct Chest Wo Contrast  Result Date: 03/08/2018 CLINICAL DATA:  Altered mental status and history of end-stage renal disease. EXAM: CT CHEST WITHOUT CONTRAST TECHNIQUE: Multidetector CT imaging of the chest was performed following the standard protocol without IV contrast. COMPARISON:  Chest x-ray earlier today. FINDINGS: Cardiovascular: The heart is moderately enlarged. No pericardial effusion identified. Calcified coronary artery plaque in a 3 vessel distribution. Dialysis catheter present extending to the SVC/RA junction. Mediastinum/Nodes: No enlarged mediastinal or axillary lymph nodes. Thyroid gland, trachea, and esophagus demonstrate no significant findings. Lungs/Pleura: No overt edema. Dependent posterior opacities in both lower lobes most likely represent atelectasis. Subtle bibasilar pneumonia cannot be excluded. There are trace bilateral pleural effusions. No pneumothorax or pulmonary nodule identified. Upper Abdomen: No acute abnormality. Musculoskeletal: No  bony fractures or focal lesions. Diffuse body wall edema present. IMPRESSION: 1. Bibasilar opacities most likely represent atelectasis. Subtle bibasilar pneumonia cannot be excluded. There are small bilateral pleural effusions without evidence of pulmonary edema. 2. Moderate cardiac enlargement. 3. Coronary atherosclerosis with calcified plaque in a 3 vessel distribution. 4. Dialysis catheter with tip at SVC/RA junction. 5. Diffuse body wall edema and anasarca. Electronically Signed   By: Aletta Edouard M.D.   On: 03/08/2018 18:12   Dg Chest Port 1 View  Result Date: 03/08/2018 CLINICAL DATA:  72 year old male with altered mental status. End-stage renal disease. EXAM: PORTABLE CHEST 1 VIEW COMPARISON:  02/26/2018 and earlier. FINDINGS: Portable AP semi upright view at 1320 hours. Stable left chest tunneled  IJ approach dialysis catheter. Stable cardiomegaly and mediastinal contours. Lower lung volumes. Pulmonary vascularity appears stable. No pneumothorax. Mild increased bibasilar opacity with no definite consolidation or pleural effusion. No acute osseous abnormality identified. IMPRESSION: 1. Lower lung volumes with bibasilar opacity suspected due to atelectasis. 2. Stable cardiomegaly and left chest dialysis catheter. Electronically Signed   By: Genevie Ann M.D.   On: 03/08/2018 13:31    ASSESSMENT & PLAN:   72 y.o. male with multiple medical comorbidities and very poor functional status with recurrent hospitalization for multiple issues now with  1. Severe Thrombocytopenia  Patient had some mild thrombocytopenia at baseline of about 120k about a month before hospitalization. He was admitted with platelet counts of 88k which have dropped down to 10-20k range.  The primary etiology of his acute drop in platelets is likely group B streptococcal sepsis with associated DIC and elevated APTT and PT. Borderline elevated LDH in the setting of possible pneumonia and sepsis.  Normal haptoglobin argues against overt hemolysis.  The overall clinical picture and absence of hemolysis rules out TTP/HUS spectrum disorder.  Additional thrombocytopenia could also be related to his use of broad-spectrum antibiotics including vancomycin and cefepime and potentially other medications including allopurinol.  No overt bleeding at this time.  2.  Coagulopathy with elevated APTT and PT. Likely related to DIC in the setting of sepsis. With his poor oral intake and diarrhea cannot rule out the possibility of vitamin K deficiency. No clear history of liver cirrhosis though the very low albumin level and elevated bilirubin levels with direct hyperbilirubinemia suggest both poor nutritional status but possibly also liver disease which could be additional factors and his coagulopathy and thrombocytopenia.  The presence of  significant target cells on peripheral blood also would be suggestive of possible liver disease. No overt evidence of liver cirrhosis or splenomegaly on his CT abdomen pelvis on 07/03/2017.  Plan -Patient is a poor historian but his daughter was at bedside and we discussed likely etiologies of his severe thrombocytopenia. -Low likelihood of TTP/history of spectrum disorder in the setting of associated coagulopathy and lack of overt hemolysis at this time. -Would treat the underlying sepsis as is being done. -Patient is now of vancomycin and cefepime which could have also worsened his thrombocytopenia previously. -Would transfuse platelets as needed to maintain platelet counts of 20,000 and above prophylactically given his coagulopathy and sepsis that increases risk of bleeding and also with his relatively recent subdural hematoma. -Reasonable to try vitamin K 5 mg subcu daily for 3 to 5 days to check for responsiveness to determine possibility of vitamin K reversible coagulopathy. -Minimize medications causing thrombocytopenia. -The patient has been having active heparin exposure with dialysis might consider sending HIT antibody and SRA. -For obvious reasons he  will have to be off NSAIDs and blood thinners given his severe thrombocytopenia.  3.  Recent history of DVT in the setting of traumatic subdural hematoma.  Status post IVC filter -Has not been a candidate for therapeutic anticoagulation.  Overall the bigger perspective is that the patient's multiple medical comorbidities and recurrent complications and poor functional status will need a detailed goals of care discussion.  They will need to be clear discussion with the family regarding the burden of treatments and the futility of some elements of treatment and the fact that the patient will continue to have recurrent admissions with worsening overall health.  Please call if any additional questions.    All of the patients questions were  answered with apparent satisfaction. The patient knows to call the clinic with any problems, questions or concerns.  The toal time spent in the appt was 80 minutes and more than 50% was on counseling and direct patient cares.    Sullivan Lone MD MS AAHIVMS [facility] Hematology/Oncology Physician [facility]  (Office):       [phone] (Work cell):  [phone] (Fax):           [phone]  03/10/2018 4:21 PM  I, Baldwin Jamaica, am acting as a Education administrator for Dr Irene Limbo.   .I have reviewed the above documentation for accuracy and completeness, and I agree with the above. Sullivan Lone MD MS

## 2018-03-10 NOTE — Progress Notes (Addendum)
Hematology/Oncology consult received.  Case discussed with Dr Algis Liming. Thrombocytopenia with few schistocytes and coagulopathy with elevated PT and aPTT suggestive of DIC in the setting of sepsis.Unimpressive LDH. Low likelihood of TTP.  Potentially additional factor for thrombocytopenia- sepsis and abx (Vancomycin/Zosyn). Plan -Rx of underlying sepsis -platelet prn for bleeding or prophylactic if <20k in setting of sepsis and coagulopathy -goals of care discussion. -abx have already been switched. -will see patient today. -FFP if actively bleeding -consider empiric vit K 5mg  daily x 3 to address any vit K dependent coagulopathy in the setting of poor po intake and Abx  Sullivan Lone MD

## 2018-03-10 NOTE — Clinical Social Work Note (Signed)
Clinical Social Work Assessment  Patient Details  Name: Albert Stanley MRN: [id number] Date of Birth: 13-Jun-1946  Date of referral:  03/10/18               Reason for consult:  Facility Placement, Discharge Planning                Permission sought to share information with:  Family Supports Permission granted to share information::  No(Patient oriented to self only)  Name::     [place]::     Relationship::  Wife  Contact Information:  [phone] (mobile)  Housing/Transportation Living arrangements for the past 2 months:  Skilled Nursing Facility(Patient from [facility]) Source of Information:  Spouse Patient Interpreter Needed:  None Criminal Activity/Legal Involvement Pertinent to Current Situation/Hospitalization:  No - Comment as needed Significant Relationships:  Adult Children, Spouse Lives with:  Facility Resident(Came to hospital from [facility]) Do you feel safe going back to the place where you live?  Yes Need for family participation in patient care:  Yes (Comment)  Care giving concerns:  Wife reported that she has been under a lot of stress associated with her husband and his medical issues. She is very concerned about how she will care for her husband at home, once his rehab is completed at [facility]. Mrs. Doenges also expressed her concerns regarding issues she has with the care her husband receives at the SNF.  Social Worker assessment / plan:  CSW talked with wife regarding discharge disposition for patient. Mrs. Geurts confirmed that the plan is for her husband to return to [facility] once medically stable for discharge.  CSW talked privately with wife in 20M SW office regarding her husband illness and the emotional toll it has taken on her. Mrs. Scheel shared history regarding her husband's medical issues, SNF placements and her concerns about having to care for him at home. Wife informed CSW that she is on medication for anxiety, and  works a FT job, but has not been to work this week due to her emotional state. CSW listened empathetically while Mrs. Crist processed and shared her feelings and offered understanding and supportive encouragement. Mrs. Dhawan informed CSW about the palliative meeting on Friday and this was discussed, and she was provided with information on what Palliative Care is.  Employment status:  Retired Research officer, political party) PT Recommendations:  [place] / Referral to community resources:  Other (Comment Required)(None needed or requested as patient from SNF)  Patient/Family's Response to care:  Mrs. Dittus did not express any concerns regarding the care her husband has received during hospitalization.  Patient/Family's Understanding of and Emotional Response to Diagnosis, Current Treatment, and Prognosis:  Mrs. Olveda expressed understanding of her husbands medical issues. She was able to share and express how his medical issues have affected her and what she is doing to cope with her feelings of anxiety and stress. CSW explained to Mrs. Thum that Palliative Care does not necessarily mean end of life which noticeably eased her mind.  Emotional Assessment Appearance:  Appears stated age Attitude/Demeanor/Rapport:  Other(Patient was sleeping when CSW visited room) Affect (typically observed):  Unable to Assess Orientation:  Oriented to Self Alcohol / Substance use:  Tobacco Use, Alcohol Use, Illicit Drugs(Patient quit smokin, quit drinking 2 years ago and does not use illicit drugs) Psych involvement (Current and /or in the community):  No (Comment)  Discharge Needs  Concerns to be addressed:  Discharge  Planning Concerns Readmission within the last 30 days:  Yes Current discharge risk:  None Barriers to Discharge:  Continued Medical Work up   [employer] Mila Homer, LCSW 03/10/2018, 5:26 PM

## 2018-03-10 NOTE — Progress Notes (Signed)
PROGRESS NOTE   Albert Stanley  ZOX:[id number]    DOB: [birth year]    DOA: 03/08/2018  PCP: [name], MD   I have briefly reviewed patients previous medical [facility] in [facility].  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 [name]), frequent hospitalizations (this is his sixth in the last 6 months), recently in 4/30-5/3 for hypoglycemia presented from HD center due to confusion and not acting himself, low-grade fever and admitted for sepsis due to group B strep bacteremia.  Nephrology and hematology consulted.   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  Patient has indwelling Foley catheter, left TDC and AVG for HD which could be potential sources but do not look overtly infected.  Elevated lactate and procalcitonin.  CT chest suggested more atelectasis rather than pneumonia.  He had been empirically started on IV  vancomycin and cefepime.  Based on culture results to IV cefazolin on 5/30 and tolerating same.  Nephrology follow-up appreciated and are planning to remove [facility] and dialyzing through the AVG.  TTE to look for vegetations.  ID consultation.  Acute toxic metabolic encephalopathy: Likely secondary to acute infection.  CT head without acute findings.  No focal deficits.  Treat underlying cause and monitor closely.  P.o. except medications until consistently awake and alert to tolerate oral diet.  Mental status seems to be slowly improving.  Continue to monitor.  Thrombocytopenia: On chart review, patient has had chronic intermittent mild thrombocytopenia in the past and coagulopathy.  He presented with platelets of 25K which is dropped to 14, INR 1.9, schistocytes noted on CBC 5/28.  I have consulted Dr. Irene Limbo, Hematology discussed with him in detail on 5/30.  He suspects sepsis related DIC and antibiotics as the cause of patient's thrombocytopenia and less likely from TTP.  Normal fibrinogen, mildly elevated LDH.  If platelets <20 or if bleeding, recommends transfusing 1 bag of platelets.  If bleeding, may need to consider FFP and if bleeding and low fibrinogen may also need to consider cryoprecipitate.  Platelets were up to 20 last night but down again to 13 and as per hematology recommendations, transfusing 1 bag of platelets.  Follow CBC posttransfusion and in a.m.  ESRD on TTS HD: Nephrology consulted and underwent HD 5/29.  Plans for HD off schedule on 5/31 and then again on 6/1 to get him back on TTS schedule.  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 [name], Urology).  SNF unsure as to when this was last changed.  Consulted Urology.  History of recurrent DVT: Status post IVC filter.  NICM/chronic systolic CHF: Chronic volume overload, managed across dialysis but no overt  pulmonary edema.  Volume status seems better today.  PVCs: Remains on amiodarone.  Chronic hypotension: Midodrine.  Blood pressures have been normal over the last 24 hours.  Type II DM with recurrent hypoglycemia: Currently CBGs okay in the 140s-150s.  Had hypoglycemic episode again this morning, CBG 45, treated per hypoglycemia protocol.  Now that he is more alert, requesting speech therapy to reassess regarding starting diet.  Dysphagia: Speech therapy input appreciated.  N.p.o. except medications until able to adequately evaluate based on improvement in mental status.  ST to reassess now that mental status is better.  Anemia in ESRD: Stable.  History of SDH status post craniotomy: Repeat CT head without acute findings.  Adult FTT: I discussed in detail with patient's spouse on 5/30 and outlined overall poor long-term prognosis given advanced age, multiple significant severe comorbidities, frequent hospitalizations.  Requested palliative care for goals of care, she was agreeable.  Isolated hyperbilirubinemia:?  Related to sepsis related DIC.  Follow full LFTs in a.m.   DVT prophylaxis: SCDs Code Status: DNR Family Communication: None at bedside today. Disposition: To be determined   Consultants:  Neurology Hematology Palliative care team for goals of care. Infectious disease.  Procedures:  Hemodialysis  Antimicrobials:  IV vancomycin and cefepime, discontinued. IV cefazolin 5/30 >   Subjective: Look better this morning.  Lying comfortably in bed.  Not fidgety or restless like he was yesterday morning.  Alert and oriented to self "[place]" and place "hospital".  Follow simple instructions.  As per nursing, no acute issues except hypoglycemia.  ROS: Unable.  Objective:  Vitals:   03/10/18 0504 03/10/18 0855 03/10/18 1125 03/10/18 1145  BP: 132/81 138/78 106/72 98/66  Pulse: 98 94 81 74  Resp: (!) 21 20 16 16   Temp: 98.6 F (37 C) 98.4 F (36.9 C) 97.8 F (36.6 C)  98.2 F (36.8 C)  TempSrc: Oral Oral    SpO2: 100% 100% 100% 100%  Weight:      Height:        Examination:  General exam: Elderly male, moderately built and nourished, chronically ill looking, lying in bed, looks better than he did yesterday morning.  Does not appear restless or fidgety. Respiratory system: Clear to auscultation. Respiratory effort normal.  Left TDC site without acute findings.  Stable. Cardiovascular system: S1 & S2 heard, RRR. No JVD, murmurs, rubs, gallops or clicks.  1+ pedal edema, better than yesterday.  Telemetry personally reviewed: Sinus rhythm. Gastrointestinal system: Abdomen is nondistended, soft and nontender. No organomegaly or masses felt. Normal bowel sounds heard.  Stable. Central nervous system: Mental status as indicated above.  No focal neurological deficits.  Follows simple instructions. Extremities: Moves all extremities symmetrically.  Left upper extremity AVG without acute findings.  Has mittens on both hands.  Apparently pulled out his peripheral IV line. Skin: No rashes, lesions or ulcers Psychiatry: Judgement and insight impaired. Mood & affect cannot be assessed.    Data Reviewed: I have personally reviewed following labs and imaging studies  CBC: Recent Labs  Lab 03/08/18 1319 03/08/18 1339 03/09/18 0322 03/09/18 1843 03/10/18 0437  WBC 6.8  --  10.3 10.3 11.1*  NEUTROABS 6.1  --   --   --  8.0*  HGB 10.6* 13.6 9.5* 9.6*  9.8*  HCT 30.7* 40.0 27.1* 27.2* 27.8*  MCV 81.6  --  81.6 79.1 79.9  PLT 25*  --  14* 20* 13*   Basic Metabolic Panel: Recent Labs  Lab 03/08/18 1319 03/08/18 1339 03/09/18 0322 03/10/18 0437  NA 134* 132* 135 138  K 4.4 4.3 4.3 3.8  CL 92* 95* 91* 92*  CO2 24  --  27 28  GLUCOSE 140* 136* 157* 47*  BUN 63* 54* 72* 36*  CREATININE 8.01* 7.90* 8.39* 5.36*  CALCIUM 9.3  --  9.1 9.0  MG  --   --  2.1  --    Liver Function Tests: Recent Labs  Lab 03/08/18 1319 03/10/18 0437  AST 31 33  ALT 18 17    ALKPHOS 72 58  BILITOT 2.9* 3.1*  PROT 5.5* 5.2*  ALBUMIN 2.3* 1.9*   Coagulation Profile: Recent Labs  Lab 03/08/18 1655  INR 1.99   Cardiac Enzymes: No results for input(s): CKTOTAL, CKMB, CKMBINDEX, TROPONINI in the last 168 hours. HbA1C: No results for input(s): HGBA1C in the last 72 hours. CBG: Recent Labs  Lab 03/08/18 1957 03/09/18 2125 03/10/18 0746 03/10/18 0820 03/10/18 1145  GLUCAP 151* 63* 45* 102* 77    Recent Results (from the past 240 hour(s))  Culture, blood (routine x 2)     Status: Abnormal   Collection Time: 03/08/18  2:30 PM  Result Value Ref Range Status   Specimen Description BLOOD RIGHT HAND  Final   Special Requests   Final    BOTTLES DRAWN AEROBIC AND ANAEROBIC Blood Culture results may not be optimal due to an inadequate volume of blood received in culture bottles Performed at [facility] [address] [place], [place] [address]    Culture  Setup Time   Final    IN BOTH AEROBIC AND ANAEROBIC BOTTLES GRAM POSITIVE COCCI Organism ID to follow CRITICAL RESULT CALLED TO, READ BACK BY AND VERIFIED WITH: L SEAY PHARMD 03/09/18 0239 JDW    Culture GROUP B STREP(S.AGALACTIAE)ISOLATED (A)  Final   Report Status 03/10/2018 FINAL  Final   Organism ID, Bacteria GROUP B STREP(S.AGALACTIAE)ISOLATED  Final      Susceptibility   Group b strep(s.agalactiae)isolated - MIC*    CLINDAMYCIN >=1 RESISTANT Resistant     AMPICILLIN <=0.25 SENSITIVE Sensitive     ERYTHROMYCIN >=8 RESISTANT Resistant     VANCOMYCIN 0.5 SENSITIVE Sensitive     CEFTRIAXONE <=0.12 SENSITIVE Sensitive     LEVOFLOXACIN 1 SENSITIVE Sensitive     * GROUP B STREP(S.AGALACTIAE)ISOLATED  Blood Culture ID Panel (Reflexed)     Status: Abnormal   Collection Time: 03/08/18  2:30 PM  Result Value Ref Range Status   Enterococcus species NOT DETECTED NOT DETECTED Final   Listeria monocytogenes NOT DETECTED NOT DETECTED Final   Staphylococcus species NOT DETECTED NOT DETECTED Final    Staphylococcus aureus NOT DETECTED NOT DETECTED Final   Streptococcus species DETECTED (A) NOT DETECTED Final    Comment: CRITICAL RESULT CALLED TO, READ BACK BY AND VERIFIED WITH: L SEAY PHARMD 03/09/18 0239 JDW    Streptococcus agalactiae DETECTED (A) NOT DETECTED Final    Comment: CRITICAL RESULT CALLED TO, READ BACK BY AND VERIFIED WITH: L SEAY PHARMD 03/09/18 0239 JDW    Streptococcus pneumoniae NOT DETECTED NOT DETECTED Final   Streptococcus pyogenes NOT DETECTED NOT DETECTED Final   Acinetobacter baumannii NOT DETECTED NOT DETECTED Final   Enterobacteriaceae species NOT DETECTED NOT DETECTED Final   Enterobacter  cloacae complex NOT DETECTED NOT DETECTED Final   Escherichia coli NOT DETECTED NOT DETECTED Final   Klebsiella oxytoca NOT DETECTED NOT DETECTED Final   Klebsiella pneumoniae NOT DETECTED NOT DETECTED Final   Proteus species NOT DETECTED NOT DETECTED Final   Serratia marcescens NOT DETECTED NOT DETECTED Final   Haemophilus influenzae NOT DETECTED NOT DETECTED Final   Neisseria meningitidis NOT DETECTED NOT DETECTED Final   Pseudomonas aeruginosa NOT DETECTED NOT DETECTED Final   Candida albicans NOT DETECTED NOT DETECTED Final   Candida glabrata NOT DETECTED NOT DETECTED Final   Candida krusei NOT DETECTED NOT DETECTED Final   Candida parapsilosis NOT DETECTED NOT DETECTED Final   Candida tropicalis NOT DETECTED NOT DETECTED Final  Culture, blood (routine x 2)     Status: None (Preliminary result)   Collection Time: 03/08/18  3:00 PM  Result Value Ref Range Status   Specimen Description BLOOD RIGHT UPPER ARM  Final   Special Requests   Final    BOTTLES DRAWN AEROBIC AND ANAEROBIC Blood Culture adequate volume   Culture   Final    NO GROWTH < 24 HOURS Performed at [facility], [address] [place], [place] [address]    Report Status PENDING  Incomplete         Radiology Studies: Ct Head Wo Contrast  Result Date: 03/08/2018 CLINICAL DATA:   Altered level of consciousness EXAM: CT HEAD WITHOUT CONTRAST TECHNIQUE: Contiguous axial images were obtained from the base of the skull through the vertex without intravenous contrast. COMPARISON:  01/18/2018 FINDINGS: Brain: No evidence of acute infarction, hemorrhage, extra-axial collection, ventriculomegaly, or mass effect. Generalized cerebral atrophy. Periventricular white matter low attenuation likely secondary to microangiopathy. Vascular: Cerebrovascular atherosclerotic calcifications are noted. Skull: Prior right frontoparietal craniotomy. Sinuses/Orbits: Visualized portions of the orbits are unremarkable. Visualized portions of the paranasal sinuses and mastoid air cells are unremarkable. Other: None. IMPRESSION: No acute intracranial pathology. Electronically Signed   By: Kathreen Devoid   On: 03/08/2018 14:29   Ct Chest Wo Contrast  Result Date: 03/08/2018 CLINICAL DATA:  Altered mental status and history of end-stage renal disease. EXAM: CT CHEST WITHOUT CONTRAST TECHNIQUE: Multidetector CT imaging of the chest was performed following the standard protocol without IV contrast. COMPARISON:  Chest x-ray earlier today. FINDINGS: Cardiovascular: The heart is moderately enlarged. No pericardial effusion identified. Calcified coronary artery plaque in a 3 vessel distribution. Dialysis catheter present extending to the SVC/RA junction. Mediastinum/Nodes: No enlarged mediastinal or axillary lymph nodes. Thyroid gland, trachea, and esophagus demonstrate no significant findings. Lungs/Pleura: No overt edema. Dependent posterior opacities in both lower lobes most likely represent atelectasis. Subtle bibasilar pneumonia cannot be excluded. There are trace bilateral pleural effusions. No pneumothorax or pulmonary nodule identified. Upper Abdomen: No acute abnormality. Musculoskeletal: No bony fractures or focal lesions. Diffuse body wall edema present. IMPRESSION: 1. Bibasilar opacities most likely represent  atelectasis. Subtle bibasilar pneumonia cannot be excluded. There are small bilateral pleural effusions without evidence of pulmonary edema. 2. Moderate cardiac enlargement. 3. Coronary atherosclerosis with calcified plaque in a 3 vessel distribution. 4. Dialysis catheter with tip at SVC/RA junction. 5. Diffuse body wall edema and anasarca. Electronically Signed   By: Aletta Edouard M.D.   On: 03/08/2018 18:12        Scheduled Meds: . allopurinol  300 mg Oral Daily  . amiodarone  200 mg Oral Daily  . Chlorhexidine Gluconate Cloth  6 each Topical Q0600  . diclofenac sodium  2 g  Topical TID  . feeding supplement (PRO-STAT SUGAR FREE 64)  30 mL Oral BID  . midodrine  5 mg Oral Q T,Th,Sa-HD  . oxybutynin  5 mg Oral BID  . pantoprazole  40 mg Oral QHS  . sevelamer carbonate  2,400 mg Oral TID WC  . sodium chloride flush  10-40 mL Intracatheter Q12H  . sodium chloride flush  3 mL Intravenous Q12H  . sodium chloride flush  3 mL Intravenous Q12H   Continuous Infusions: . sodium chloride    . sodium chloride    .  ceFAZolin (ANCEF) IV       LOS: 2 days     Vernell Leep, MD, FACP, [facility]. [facility] Pager [phone]  If 7PM-7AM, please contact night-coverage [email or web address] Password [email or web address] 03/10/2018, 2:00 PM

## 2018-03-10 NOTE — Progress Notes (Signed)
Paged Rehab for speech eval again today as pt is more eval, per Rehab nurse he will be assigned to one of the speech therapists today to evaluate him.

## 2018-03-10 NOTE — Consult Note (Signed)
Hospital Consult    Reason for Consult:  tdc removal Referring Physician:  Dr. Jonnie Finner MRN #:  [id number]  History of Present Illness : This is a 72 y.o. male with history of end-stage renal disease has a left arm AV graft as well as a left-sided tunneled catheter.  He is now here with altered mental status and group B strep bacteremia of unknown source.  He also has significant thrombocytopenia.  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  We have been consulted for possible removal of his tunneled catheter.   Past Medical History:  Diagnosis Date  . Arthritis    Osteoarthritis  . Asthma   . Bladder cancer ([place]) dx'd 1990   surg only  . CHF (congestive heart failure) ([place])   . Diabetes mellitus without complication ([place])   . ESRD (end stage renal disease) ([name])    Tues, Thurs, Sat dialysis  . GERD (gastroesophageal reflux disease)   . History of cardiac catheterization    a. LHC 5/17: no obstructive CAD  . History of DVT (deep vein thrombosis)    2003 ish  . History of nuclear stress test    a. Myoview 4/17: EF 39%, inf, inf-lat, apical inf, apical lat, apical scar, intermediate risk  . Hyperlipidemia   . Hypertension   . NICM (nonischemic cardiomyopathy) ([place])    a. Echo 2/17: mod LVH, EF 35-40%, inf-lat and inf-sept HK, mod MR, severe LAE, small pericardial effusion    Past Surgical History:  Procedure Laterality Date  . arthroscopic knee surgery Left   . AV FISTULA PLACEMENT Left 12/13/2015   Procedure: ARTERIOVENOUS (AV) FISTULA CREATION;  Surgeon: Angelia Mould, MD;  [facility]: [facility] OR;  Service: Vascular;  Laterality: Left;  . AV FISTULA PLACEMENT Left 12/15/2017   Procedure: INSERTION OF ARTERIOVENOUS (AV) GORE-TEX GRAFT ARM;  Surgeon: Serafina Mitchell, MD;  Location: [place];  Service: Vascular;  Laterality: Left;  . BACK SURGERY     2 times  1960  . CARDIAC CATHETERIZATION N/A 02/28/2016   Procedure:  Left Heart Cath and Coronary Angiography;  Surgeon: Larey Dresser, MD;  Location: [place] [facility];  Service: Cardiovascular;  Laterality: N/A;  . CRANIOTOMY N/A 12/05/2017   Procedure: CRANIOTOMY HEMATOMA EVACUATION SUBDURAL;  Surgeon: Ashok Pall, MD;  Location: [place];  Service: Neurosurgery;  Laterality: N/A;  . CYSTOSCOPY    . FISTULA SUPERFICIALIZATION Left 08/14/2016   Procedure: FISTULA SUPERFICIALIZATION LEFT UPPER ARM;  Surgeon: Angelia Mould, MD;  Location: [place];  Service: Vascular;  Laterality: Left;  . INSERTION OF DIALYSIS CATHETER N/A 12/13/2015   Procedure: INSERTION OF DIALYSIS CATHETER;  Surgeon: Angelia Mould, MD;  Location: [place];  Service: Vascular;  Laterality: N/A;  . INSERTION OF DIALYSIS CATHETER Left 05/07/2017   Procedure: INSERTION OF DIALYSIS CATHETER LEFT INTERNAL JUGULAR PLACEMENT;  Surgeon: Angelia Mould, MD;  Location: [place];  Service: Vascular;  Laterality: Left;  . INSERTION OF DIALYSIS CATHETER Left 12/15/2017   Procedure: INSERTION OF DIALYSIS CATHETER;  Surgeon: Serafina Mitchell, MD;  Location: [facility] OR;  Service: Vascular;  Laterality: Left;  . IR IVC FILTER PLMT / S&I [place] Keels GUID/MOD SED  12/18/2017  . IR RADIOLOGIST EVAL & MGMT  05/12/2017  . JOINT REPLACEMENT Left    hip  . LIGATION OF COMPETING BRANCHES OF ARTERIOVENOUS FISTULA Left 08/14/2016   Procedure: LIGATION OF COMPETING BRANCHES OF ARTERIOVENOUS FISTULA LEFT  UPPER ARM;  Surgeon: Angelia Mould, MD;  Location: [place];  Service: Vascular;  Laterality: Left;  . PERIPHERAL VASCULAR CATHETERIZATION Left 06/09/2016   Procedure: Fistulagram;  Surgeon: Serafina Mitchell, MD;  Location: [place] [facility];  Service: Cardiovascular;  Laterality: Left;  ARM  . PERIPHERAL VASCULAR CATHETERIZATION Left 06/09/2016   Procedure: Peripheral Vascular Balloon Angioplasty;  Surgeon: Serafina Mitchell, MD;  Location: [place] [facility];  Service: Cardiovascular;  Laterality: Left;  upper arm venous  .  PERIPHERAL VASCULAR CATHETERIZATION Left 06/25/2016   Procedure: Fistulagram;  Surgeon: Serafina Mitchell, MD;  Location: [place] [facility];  Service: Cardiovascular;  Laterality: Left;  . REVISON OF ARTERIOVENOUS FISTULA Left 05/07/2017   Procedure: REPAIR OF PSEUDOANEURYSM  LEFT BRACHIO-CEPHALIC  ARM ARTERIOVENOUS FISTULA;  Surgeon: Angelia Mould, MD;  Location: [place];  Service: Vascular;  Laterality: Left;    Allergies  Allergen Reactions  . Meperidine Shortness Of Breath and Other (See Comments)    Increased heart rate; Reaction to demerol  . Penicillins Other (See Comments)    Has patient had a PCN reaction causing immediate rash, facial/tongue/throat swelling, SOB or lightheadedness with hypotension: Yes (syncope) Has patient had a PCN reaction causing severe rash involving mucus membranes or skin necrosis: No Has patient had a PCN reaction that required hospitalization: No (treated at MD's office) Has patient had a PCN reaction occurring within the last 10 years: No If all of the above answers are "NO", then may proceed with Cephalosporin use.  . Sulfa Antibiotics Shortness Of Breath and Other (See Comments)    Increased heart rate  . Sulfonamide Derivatives Shortness Of Breath and Other (See Comments)    TACHYCARDIA  . [place] Diarrhea, Nausea And Vomiting and Other (See Comments)    Constipation Reaction to whole milk  . Lactose Intolerance (Gi) Diarrhea, Nausea And Vomiting and Other (See Comments)    Constipation Reaction to whole milk  . Milk-Related Compounds Diarrhea, Nausea And Vomiting and Other (See Comments)    Constipation Reaction to whole milk  . Foltx [Elfolate Plus] Other (See Comments)    UNSPECIFIED REACTION  "Pt does not recall reaction to this"  . Food Other (See Comments)    POTATOES  Unknown reaction  . Orange Juice [Orange Oil] Other (See Comments)    Unknown reaction  . Tomato Other (See Comments)    Unknown reaction   . Betadine [Povidone  Iodine] Itching, Rash and Other (See Comments)    burning  . Eggs Or Egg-Derived Products Nausea And Vomiting    Reaction to egg yolks - not whites      Prior to Admission medications   Medication Sig Start Date End Date Taking? Authorizing Provider  acetaminophen (TYLENOL) 500 MG tablet Take 500 mg by mouth every 6 (six) hours as needed for mild pain.   Yes [provider]  allopurinol (ZYLOPRIM) 300 MG tablet Take 300 mg by mouth daily.    Yes [provider]  Amino Acids-Protein Hydrolys (FEEDING SUPPLEMENT, PRO-STAT SUGAR FREE 64,) LIQD Take 30 mLs by mouth 2 (two) times daily.   Yes [provider]  amiodarone (PACERONE) 200 MG tablet Take 1 tablet (200 mg total) by mouth daily. 09/24/17  Yes Larey Dresser, MD  bisacodyl (DULCOLAX) 5 MG EC tablet Take 5 mg by mouth every other day. Hold for loose stools   Yes [provider]  diclofenac sodium (VOLTAREN) 1 % GEL Apply 2 g topically 3 (  three) times daily. Apply to lower back, left hip and knee   Yes [provider]  diphenhydrAMINE (BENADRYL) 25 MG tablet Take 25 mg by mouth every 6 (six) hours as needed for allergies.   Yes [provider]  docusate sodium (COLACE) 100 MG capsule Take 100 mg by mouth 2 (two) times daily. Hold for loose stools.   Yes [provider]  Melatonin 3 MG TABS Take 6 mg by mouth at bedtime.    Yes [provider]  midodrine (PROAMATINE) 5 MG tablet Take 1 tablet (5 mg total) by mouth Every Tuesday,Thursday,and Saturday with dialysis. 02/12/18  Yes Hongalgi, Lenis Dickinson, MD  Multiple Vitamin (MULTIVITAMIN WITH MINERALS) TABS tablet Take 1 tablet by mouth daily.   Yes [provider]  Neo-Bacit-Poly-Lidocaine (PROCOMYCIN) 4 % CREA Apply 1 application topically daily as needed. apply to scalp   Yes [provider]  oxybutynin (DITROPAN) 5 MG tablet Take 5 mg by mouth 2 (two) times daily.   Yes [provider]    oxyCODONE-acetaminophen (PERCOCET) 7.5-325 MG tablet Take 1 tablet by mouth every 6 (six) hours as needed for moderate pain. 02/11/18  Yes Hongalgi, Lenis Dickinson, MD  pantoprazole (PROTONIX) 40 MG tablet Take 40 mg by mouth at bedtime.   Yes [provider]  senna (SENOKOT) 8.6 MG TABS tablet Take 1 tablet by mouth at bedtime.   Yes [provider]  sevelamer carbonate (RENVELA) 800 MG tablet Take 3 tablets (2,400 mg total) by mouth 3 (three) times daily with meals. 02/11/18  Yes Hongalgi, Lenis Dickinson, MD  Zinc Oxide 13 % CREA Apply 1 application topically daily.   Yes [provider]  bacitracin ointment Apply 1 application topically 2 (two) times daily. Patient not taking: Reported on 03/08/2018 01/18/18   Antonietta Breach, PA-C  lactulose [facility]) 10 GM/15ML solution Take 10 g by mouth daily as needed for mild constipation.    [provider]  liver oil-zinc oxide (DESITIN) 40 % ointment Apply 1 application topically daily as needed for irritation.    [provider]  multivitamin (RENA-VIT) TABS tablet Take 1 tablet by mouth at bedtime.  12/20/15   [provider]  neomycin-bacitracin-polymyxin (NEOSPORIN) [id number] ointment Apply 1 application topically daily as needed (area on scalp).    [provider]    Social History   Socioeconomic History  . Marital status: Married    Spouse name: Not on file  . Number of children: Not on file  . Years of education: Not on file  . Highest education level: Not on file  Occupational History  . Occupation: Retired  Scientific laboratory technician  . Financial resource strain: Not on file  . Food insecurity:    Worry: Not on file    Inability: Not on file  . Transportation needs:    Medical: Not on file    Non-medical: Not on file  Tobacco Use  . Smoking status: Former Smoker    Years: 30.00    Last attempt to quit: 10/12/1998    Years since quitting: 19.4  . Smokeless tobacco: Never Used  Substance and Sexual  Activity  . Alcohol use: No    Alcohol/week: 0.0 oz    Comment: Used to drink on the weekends, quit 2 years ago  . Drug use: No  . Sexual activity: Not Currently  Lifestyle  . Physical activity:    Days per week: Not on file    Minutes per session: Not on file  .  Stress: Not on file  Relationships  . Social connections:    Talks on phone: Not on file    Gets together: Not on file    Attends religious service: Not on file    Active member of club or organization: Not on file    Attends meetings of clubs or organizations: Not on file    Relationship status: Not on file  . Intimate partner violence:    Fear of current or ex partner: Not on file    Emotionally abused: Not on file    Physically abused: Not on file    Forced sexual activity: Not on file  Other Topics Concern  . Not on file  Social History Narrative   Lives with wife and daughter.     Family History  Problem Relation Age of Onset  . Arthritis Mother   . Cancer Father   . Heart disease Father   . Hypertension Sister   . Alcohol abuse Brother   . Diabetes Daughter   . Heart attack Paternal Uncle     ROS: Could not be obtained secondary to patient's mental status   Physical Examination  Vitals:   03/10/18 1145 03/10/18 1415  BP: 98/66 105/74  Pulse: 74 79  Resp: 16 18  Temp: 98.2 F (36.8 C) 98.1 F (36.7 C)  SpO2: 100% 100%   Body mass index is 29 kg/m.  General: No acute distress HENT: WNL, normocephalic Pulmonary: normal non-labored breathing Cardiac: Palpable left radial pulse  Abdomen:  soft, NT/ND, no masses Extremities: There is a thrill in his left upper extremity Musculoskeletal: no muscle wasting or atrophy  Neurologic: He is awake but mostly obtunded Chest: Tunneled catheter in left without any evidence of infection  CBC    Component Value Date/Time   WBC 11.1 (H) 03/10/2018 0437   RBC 3.48 (L) 03/10/2018 0437   HGB 9.8 (L) 03/10/2018 0437   HGB 11.4 (L) 12/30/2006 0949    HCT 27.8 (L) 03/10/2018 0437   HCT 33.7 (L) 12/30/2006 0949   PLT 13 (LL) 03/10/2018 0437   PLT 248 12/30/2006 0949   MCV 79.9 03/10/2018 0437   MCV 89.8 12/30/2006 0949   MCH 28.2 03/10/2018 0437   MCHC 35.3 03/10/2018 0437   RDW 18.8 (H) 03/10/2018 0437   RDW 15.7 (H) 12/30/2006 0949   LYMPHSABS 1.1 03/10/2018 0437   LYMPHSABS 1.3 12/30/2006 0949   MONOABS 1.8 (H) 03/10/2018 0437   MONOABS 0.6 12/30/2006 0949   EOSABS 0.2 03/10/2018 0437   EOSABS 0.3 12/30/2006 0949   BASOSABS 0.0 03/10/2018 0437   BASOSABS 0.0 12/30/2006 0949    BMET    Component Value Date/Time   NA 138 03/10/2018 0437   K 3.8 03/10/2018 0437   CL 92 (L) 03/10/2018 0437   CO2 28 03/10/2018 0437   GLUCOSE 47 (L) 03/10/2018 0437   BUN 36 (H) 03/10/2018 0437   CREATININE 5.36 (H) 03/10/2018 0437   CALCIUM 9.0 03/10/2018 0437   CALCIUM 8.6 10/29/2011 1000   GFRNONAA 10 (L) 03/10/2018 0437   GFRAA 11 (L) 03/10/2018 0437    COAGS: Lab Results  Component Value Date   INR 1.99 03/08/2018   INR 1.59 01/18/2018   INR 1.72 12/09/2017      ASSESSMENT/PLAN: This is a 72 y.o. male with end-stage renal disease has both a tunneled catheter as well as a graft in the left arm where he has a failed fistula.  Nothing appears overtly infected but he is  here with group B strep bacteremia.  There is concern for DIC secondary to sepsis.  We can plan to remove his tunneled catheter and allow him to use his fistula but he will need platelet count of at least 30 currently is 13,000.  We will recheck CBC in the morning and possibly remove tomorrow at bedside.  Fonnie Crookshanks C. Donzetta Matters, MD [facility] Office: [phone] Pager: [phone]

## 2018-03-10 NOTE — Consult Note (Signed)
Consult: urinary retention, difficult foley   History of Present Illness: 72 yo AAM followed by Dr. 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 Pt with h/o ESRD and admitted from HD due to confusion and not acting himself, low-grade fever and admitted for sepsis due to group B strep bacteremia. It's time for foley catheter to be changed, especially while pt here on abx. Nurse removed foley and could not get a new one placed. He usually has foley changed at [facility] by nurse.      Past Medical History:  Diagnosis Date  . Arthritis    Osteoarthritis  . Asthma   . Bladder cancer ([place]) dx'd 1990   surg only  . CHF (congestive heart failure) ([place])   . Diabetes mellitus without complication ([place])   . ESRD (end stage renal disease) ([name])    Tues, Thurs, Sat dialysis  . GERD (gastroesophageal reflux disease)   . History of cardiac catheterization    a. LHC 5/17: no obstructive CAD  . History of DVT (deep vein thrombosis)    2003 ish  . History of nuclear stress test    a. Myoview 4/17: EF 39%, inf, inf-lat, apical inf, apical lat, apical scar, intermediate risk  . Hyperlipidemia   . Hypertension   . NICM (nonischemic cardiomyopathy) ([place])    a. Echo 2/17: mod LVH, EF 35-40%, inf-lat and inf-sept HK, mod MR, severe LAE, small pericardial effusion   Past Surgical History:  Procedure Laterality Date  . arthroscopic knee surgery Left   . AV FISTULA PLACEMENT Left 12/13/2015   Procedure: ARTERIOVENOUS (AV) FISTULA CREATION;  Surgeon: Angelia Mould, MD;  Location: [facility] OR;  Service: Vascular;  Laterality: Left;  . AV FISTULA PLACEMENT Left 12/15/2017   Procedure: INSERTION OF ARTERIOVENOUS (AV) GORE-TEX GRAFT ARM;  Surgeon: Serafina Mitchell, MD;  Location: [place];  Service: Vascular;  Laterality: Left;  . BACK SURGERY     2 times  1960  . CARDIAC CATHETERIZATION N/A 02/28/2016   Procedure: Left Heart Cath and Coronary Angiography;  Surgeon: Larey Dresser, MD;  Location: [place] [facility];  Service: Cardiovascular;  Laterality: N/A;  . CRANIOTOMY N/A 12/05/2017   Procedure: CRANIOTOMY HEMATOMA EVACUATION SUBDURAL;  Surgeon: Ashok Pall, MD;  Location: [place];  Service: Neurosurgery;  Laterality: N/A;  . CYSTOSCOPY    . FISTULA SUPERFICIALIZATION Left 08/14/2016   Procedure: FISTULA SUPERFICIALIZATION LEFT UPPER ARM;  Surgeon: Angelia Mould, MD;  Location: [place];  Service: Vascular;  Laterality: Left;  . INSERTION OF DIALYSIS CATHETER N/A 12/13/2015   Procedure: INSERTION OF DIALYSIS CATHETER;  Surgeon: Angelia Mould, MD;  Location: [place];  Service: Vascular;  Laterality: N/A;  . INSERTION OF DIALYSIS CATHETER Left 05/07/2017   Procedure: INSERTION OF DIALYSIS CATHETER LEFT INTERNAL JUGULAR PLACEMENT;  Surgeon: Angelia Mould, MD;  Location: [place];  Service: Vascular;  Laterality: Left;  . INSERTION OF DIALYSIS CATHETER Left 12/15/2017   Procedure: INSERTION OF DIALYSIS CATHETER;  Surgeon: Serafina Mitchell, MD;  Location: [facility] OR;  Service: Vascular;  Laterality: Left;  . IR IVC FILTER PLMT / S&I Burke Keels GUID/MOD SED  12/18/2017  . IR RADIOLOGIST EVAL & MGMT  05/12/2017  . JOINT REPLACEMENT Left    hip  . LIGATION OF COMPETING BRANCHES OF ARTERIOVENOUS FISTULA Left 08/14/2016   Procedure: LIGATION OF COMPETING BRANCHES OF ARTERIOVENOUS FISTULA LEFT UPPER ARM;  Surgeon: Angelia Mould, MD;  Location: [place];  Service:  Vascular;  Laterality: Left;  . PERIPHERAL VASCULAR CATHETERIZATION Left 06/09/2016   Procedure: Fistulagram;  Surgeon: Serafina Mitchell, MD;  Location: [place] [facility];  Service: Cardiovascular;  Laterality: Left;  ARM  . PERIPHERAL VASCULAR CATHETERIZATION Left 06/09/2016   Procedure: Peripheral Vascular Balloon Angioplasty;  Surgeon: Serafina Mitchell, MD;  Location: [place] [facility];  Service: Cardiovascular;  Laterality: Left;  upper arm venous  . PERIPHERAL VASCULAR CATHETERIZATION Left 06/25/2016    Procedure: Fistulagram;  Surgeon: Serafina Mitchell, MD;  Location: [place] [facility];  Service: Cardiovascular;  Laterality: Left;  . REVISON OF ARTERIOVENOUS FISTULA Left 05/07/2017   Procedure: REPAIR OF PSEUDOANEURYSM  LEFT BRACHIO-CEPHALIC  ARM ARTERIOVENOUS FISTULA;  Surgeon: Angelia Mould, MD;  Location: [facility] OR;  Service: Vascular;  Laterality: Left;    Home Medications:  Medications Prior to Admission  Medication Sig Dispense Refill Last Dose  . acetaminophen (TYLENOL) 500 MG tablet Take 500 mg by mouth every 6 (six) hours as needed for mild pain.   02/28/2018  . allopurinol (ZYLOPRIM) 300 MG tablet Take 300 mg by mouth daily.    03/08/2018 at 1231  . Amino Acids-Protein Hydrolys (FEEDING SUPPLEMENT, PRO-STAT SUGAR FREE 64,) LIQD Take 30 mLs by mouth 2 (two) times daily.   03/08/2018 at 1231  . amiodarone (PACERONE) 200 MG tablet Take 1 tablet (200 mg total) by mouth daily. 30 tablet 3 03/08/2018 at 1231  . bisacodyl (DULCOLAX) 5 MG EC tablet Take 5 mg by mouth every other day. Hold for loose stools   03/07/2018 at 2131  . diclofenac sodium (VOLTAREN) 1 % GEL Apply 2 g topically 3 (three) times daily. Apply to lower back, left hip and knee   03/07/2018 at 2131  . diphenhydrAMINE (BENADRYL) 25 MG tablet Take 25 mg by mouth every 6 (six) hours as needed for allergies.   03/04/2018  . docusate sodium (COLACE) 100 MG capsule Take 100 mg by mouth 2 (two) times daily. Hold for loose stools.   03/08/2018 at 1231  . Melatonin 3 MG TABS Take 6 mg by mouth at bedtime.    03/07/2018 at 2131  . midodrine (PROAMATINE) 5 MG tablet Take 1 tablet (5 mg total) by mouth Every Tuesday,Thursday,and Saturday with dialysis.   03/05/2018 at 1153  . Multiple Vitamin (MULTIVITAMIN WITH MINERALS) TABS tablet Take 1 tablet by mouth daily.   03/07/2018 at 2131  . Neo-Bacit-Poly-Lidocaine (PROCOMYCIN) 4 % CREA Apply 1 application topically daily as needed. apply to scalp   unknown  . oxybutynin (DITROPAN) 5 MG tablet Take  5 mg by mouth 2 (two) times daily.   03/08/2018 at 1231  . oxyCODONE-acetaminophen (PERCOCET) 7.5-325 MG tablet Take 1 tablet by mouth every 6 (six) hours as needed for moderate pain. 12 tablet 0 03/08/2018 at 0510  . pantoprazole (PROTONIX) 40 MG tablet Take 40 mg by mouth at bedtime.   03/07/2018 at 2131  . senna (SENOKOT) 8.6 MG TABS tablet Take 1 tablet by mouth at bedtime.   03/07/2018 at 2131  . sevelamer carbonate (RENVELA) 800 MG tablet Take 3 tablets (2,400 mg total) by mouth 3 (three) times daily with meals.   03/08/2018 at 1231  . Zinc Oxide 13 % CREA Apply 1 application topically daily.   03/08/2018 at 1231  . bacitracin ointment Apply 1 application topically 2 (two) times daily. (Patient not taking: Reported on 03/08/2018) 15 g 0 Not Taking at Unknown time  . lactulose (CHRONULAC) 10 GM/15ML solution Take 10  g by mouth daily as needed for mild constipation.   Not Taking at Unknown time  . liver oil-zinc oxide (DESITIN) 40 % ointment Apply 1 application topically daily as needed for irritation.   Not Taking at Unknown time  . multivitamin (RENA-VIT) TABS tablet Take 1 tablet by mouth at bedtime.   0 Not Taking at Unknown time  . neomycin-bacitracin-polymyxin (NEOSPORIN) [phone] ointment Apply 1 application topically daily as needed (area on scalp).   Not Taking at Unknown time   Allergies:  Allergies  Allergen Reactions  . Meperidine Shortness Of Breath and Other (See Comments)    Increased heart rate; Reaction to demerol  . Penicillins Other (See Comments)    Has patient had a PCN reaction causing immediate rash, facial/tongue/throat swelling, SOB or lightheadedness with hypotension: Yes (syncope) Has patient had a PCN reaction causing severe rash involving mucus membranes or skin necrosis: No Has patient had a PCN reaction that required hospitalization: No (treated at MD's office) Has patient had a PCN reaction occurring within the last 10 years: No If all of the above answers are  "NO", then may proceed with Cephalosporin use.  . Sulfa Antibiotics Shortness Of Breath and Other (See Comments)    Increased heart rate  . Sulfonamide Derivatives Shortness Of Breath and Other (See Comments)    TACHYCARDIA  . [place] Diarrhea, Nausea And Vomiting and Other (See Comments)    Constipation Reaction to whole milk  . Lactose Intolerance (Gi) Diarrhea, Nausea And Vomiting and Other (See Comments)    Constipation Reaction to whole milk  . Milk-Related Compounds Diarrhea, Nausea And Vomiting and Other (See Comments)    Constipation Reaction to whole milk  . Foltx [Elfolate Plus] Other (See Comments)    UNSPECIFIED REACTION  "Pt does not recall reaction to this"  . Food Other (See Comments)    POTATOES  Unknown reaction  . Orange Juice [Orange Oil] Other (See Comments)    Unknown reaction  . Tomato Other (See Comments)    Unknown reaction   . Betadine [Povidone Iodine] Itching, Rash and Other (See Comments)    burning  . Eggs Or Egg-Derived Products Nausea And Vomiting    Reaction to egg yolks - not whites      Family History  Problem Relation Age of Onset  . Arthritis Mother   . Cancer Father   . Heart disease Father   . Hypertension Sister   . Alcohol abuse Brother   . Diabetes Daughter   . Heart attack Paternal Uncle    Social History:  reports that he quit smoking about 19 years ago. He quit after 30.00 years of use. He has never used smokeless tobacco. He reports that he does not drink alcohol or use drugs.  ROS: A complete review of systems was performed.  All systems are negative except for pertinent findings as noted. Review of Systems  Unable to perform ROS: Mental acuity     Physical Exam:  Vital signs in last 24 hours: Temp:  [97.7 F (36.5 C)-98.6 F (37 C)] 97.7 F (36.5 C) (05/30 2057) Pulse Rate:  [74-98] 77 (05/30 2057) Resp:  [16-21] 18 (05/30 2057) BP: (98-138)/(66-81) 114/66 (05/30 2057) SpO2:  [100 %] 100 % (05/30  2057) General:  Alert but confused, No acute distress HEENT: Normocephalic, atraumatic Lungs: Regular rate and effort Abdomen: Soft, nontender, nondistended, no abdominal masses Back: No CVA tenderness Extremities: No edema Neurologic: Grossly intact GU: Uncircumcised, normal foreskin and scrotum. Meatus with  slight ventral erosion from foley but no hypospadias.   Procedure: he was prepped and a 16 Fr foley was passed. I agree with nurse, catheter met resistance in prostate. An 32 Fr coude was advanced and went without difficulty. No urine drainage was noted. I irrigated the foley and it irrigates normally.   Laboratory Data:  Results for orders placed or performed during the hospital encounter of 03/08/18 (from the past 24 hour(s))  CBC with Differential/Platelet     Status: Abnormal   Collection Time: 03/10/18  4:37 AM  Result Value Ref Range   WBC 11.1 (H) 4.0 - 10.5 K/uL   RBC 3.48 (L) 4.22 - 5.81 MIL/uL   Hemoglobin 9.8 (L) 13.0 - 17.0 g/dL   HCT 27.8 (L) 39.0 - 52.0 %   MCV 79.9 78.0 - 100.0 fL   MCH 28.2 26.0 - 34.0 pg   MCHC 35.3 30.0 - 36.0 g/dL   RDW 18.8 (H) 11.5 - 15.5 %   Platelets 13 (LL) 150 - 400 K/uL   Neutrophils Relative % 40 %   Lymphocytes Relative 10 %   Monocytes Relative 16 %   Eosinophils Relative 2 %   Basophils Relative 0 %   Band Neutrophils 31 %   Metamyelocytes Relative 1 %   Myelocytes 0 %   Promyelocytes Relative 0 %   Blasts 0 %   nRBC 0 0 /100 WBC   Other 0 %   Neutro Abs 8.0 (H) 1.7 - 7.7 K/uL   Lymphs Abs 1.1 0.7 - 4.0 K/uL   Monocytes Absolute 1.8 (H) 0.1 - 1.0 K/uL   Eosinophils Absolute 0.2 0.0 - 0.7 K/uL   Basophils Absolute 0.0 0.0 - 0.1 K/uL   RBC Morphology TARGET CELLS    WBC Morphology VACUOLATED NEUTROPHILS   Comprehensive metabolic panel     Status: Abnormal   Collection Time: 03/10/18  4:37 AM  Result Value Ref Range   Sodium 138 135 - 145 mmol/L   Potassium 3.8 3.5 - 5.1 mmol/L   Chloride 92 (L) 101 - 111 mmol/L   CO2  28 22 - 32 mmol/L   Glucose, Bld 47 (L) 65 - 99 mg/dL   BUN 36 (H) 6 - 20 mg/dL   Creatinine, Ser 5.36 (H) 0.61 - 1.24 mg/dL   Calcium 9.0 8.9 - 10.3 mg/dL   Total Protein 5.2 (L) 6.5 - 8.1 g/dL   Albumin 1.9 (L) 3.5 - 5.0 g/dL   AST 33 15 - 41 U/L   ALT 17 17 - 63 U/L   Alkaline Phosphatase 58 38 - 126 U/L   Total Bilirubin 3.1 (H) 0.3 - 1.2 mg/dL   GFR calc non Af Amer 10 (L) >60 mL/min   GFR calc Af Amer 11 (L) >60 mL/min   Anion gap 18 (H) 5 - 15  Prepare Pheresed Platelets     Status: None (Preliminary result)   Collection Time: 03/10/18  7:44 AM  Result Value Ref Range   Unit Number [id number]    Blood Component Type PLTP LR2 PAS    Unit division 00    Status of Unit ISSUED    Transfusion Status      OK TO TRANSFUSE Performed at [facility], [address], [place], [place] [address]   Glucose, capillary     Status: Abnormal   Collection Time: 03/10/18  7:46 AM  Result Value Ref Range   Glucose-Capillary 45 (L) 65 - 99 mg/dL  Glucose, capillary  Status: Abnormal   Collection Time: 03/10/18  8:20 AM  Result Value Ref Range   Glucose-Capillary 102 (H) 65 - 99 mg/dL  Save smear     Status: None   Collection Time: 03/10/18 10:09 AM  Result Value Ref Range   Smear Review SMEAR STAINED AND AVAILABLE FOR REVIEW   Glucose, capillary     Status: None   Collection Time: 03/10/18 11:45 AM  Result Value Ref Range   Glucose-Capillary 77 65 - 99 mg/dL  CBC     Status: Abnormal   Collection Time: 03/10/18  4:17 PM  Result Value Ref Range   WBC 10.0 4.0 - 10.5 K/uL   RBC 3.28 (L) 4.22 - 5.81 MIL/uL   Hemoglobin 9.2 (L) 13.0 - 17.0 g/dL   HCT 26.1 (L) 39.0 - 52.0 %   MCV 79.6 78.0 - 100.0 fL   MCH 28.0 26.0 - 34.0 pg   MCHC 35.2 30.0 - 36.0 g/dL   RDW 19.1 (H) 11.5 - 15.5 %   Platelets 22 (LL) 150 - 400 K/uL  Glucose, capillary     Status: None   Collection Time: 03/10/18  5:10 PM  Result Value Ref Range   Glucose-Capillary 77 65 - 99 mg/dL  Glucose,  capillary     Status: None   Collection Time: 03/10/18  8:54 PM  Result Value Ref Range   Glucose-Capillary 92 65 - 99 mg/dL   Recent Results (from the past 240 hour(s))  Culture, blood (routine x 2)     Status: Abnormal   Collection Time: 03/08/18  2:30 PM  Result Value Ref Range Status   Specimen Description BLOOD RIGHT HAND  Final   Special Requests   Final    BOTTLES DRAWN AEROBIC AND ANAEROBIC Blood Culture results may not be optimal due to an inadequate volume of blood received in culture bottles Performed at [facility], [address] [place], [place] [address]    Culture  Setup Time   Final    IN BOTH AEROBIC AND ANAEROBIC BOTTLES GRAM POSITIVE COCCI Organism ID to follow CRITICAL RESULT CALLED TO, READ BACK BY AND VERIFIED WITH: L SEAY PHARMD 03/09/18 0239 JDW    Culture GROUP B STREP(S.AGALACTIAE)ISOLATED (A)  Final   Report Status 03/10/2018 FINAL  Final   Organism ID, Bacteria GROUP B STREP(S.AGALACTIAE)ISOLATED  Final      Susceptibility   Group b strep(s.agalactiae)isolated - MIC*    CLINDAMYCIN >=1 RESISTANT Resistant     AMPICILLIN <=0.25 SENSITIVE Sensitive     ERYTHROMYCIN >=8 RESISTANT Resistant     VANCOMYCIN 0.5 SENSITIVE Sensitive     CEFTRIAXONE <=0.12 SENSITIVE Sensitive     LEVOFLOXACIN 1 SENSITIVE Sensitive     * GROUP B STREP(S.AGALACTIAE)ISOLATED  Blood Culture ID Panel (Reflexed)     Status: Abnormal   Collection Time: 03/08/18  2:30 PM  Result Value Ref Range Status   Enterococcus species NOT DETECTED NOT DETECTED Final   Listeria monocytogenes NOT DETECTED NOT DETECTED Final   Staphylococcus species NOT DETECTED NOT DETECTED Final   Staphylococcus aureus NOT DETECTED NOT DETECTED Final   Streptococcus species DETECTED (A) NOT DETECTED Final    Comment: CRITICAL RESULT CALLED TO, READ BACK BY AND VERIFIED WITH: L SEAY PHARMD 03/09/18 0239 JDW    Streptococcus agalactiae DETECTED (A) NOT DETECTED Final    Comment: CRITICAL RESULT CALLED  TO, READ BACK BY AND VERIFIED WITH: L SEAY PHARMD 03/09/18 0239 JDW    Streptococcus pneumoniae NOT  DETECTED NOT DETECTED Final   Streptococcus pyogenes NOT DETECTED NOT DETECTED Final   Acinetobacter baumannii NOT DETECTED NOT DETECTED Final   Enterobacteriaceae species NOT DETECTED NOT DETECTED Final   Enterobacter cloacae complex NOT DETECTED NOT DETECTED Final   Escherichia coli NOT DETECTED NOT DETECTED Final   Klebsiella oxytoca NOT DETECTED NOT DETECTED Final   Klebsiella pneumoniae NOT DETECTED NOT DETECTED Final   Proteus species NOT DETECTED NOT DETECTED Final   Serratia marcescens NOT DETECTED NOT DETECTED Final   Haemophilus influenzae NOT DETECTED NOT DETECTED Final   Neisseria meningitidis NOT DETECTED NOT DETECTED Final   Pseudomonas aeruginosa NOT DETECTED NOT DETECTED Final   Candida albicans NOT DETECTED NOT DETECTED Final   Candida glabrata NOT DETECTED NOT DETECTED Final   Candida krusei NOT DETECTED NOT DETECTED Final   Candida parapsilosis NOT DETECTED NOT DETECTED Final   Candida tropicalis NOT DETECTED NOT DETECTED Final  Culture, blood (routine x 2)     Status: None (Preliminary result)   Collection Time: 03/08/18  3:00 PM  Result Value Ref Range Status   Specimen Description BLOOD RIGHT UPPER ARM  Final   Special Requests   Final    BOTTLES DRAWN AEROBIC AND ANAEROBIC Blood Culture adequate volume   Culture   Final    NO GROWTH 2 DAYS Performed at [facility], [address] [place], [place] [address]    Report Status PENDING  Incomplete   Creatinine: Recent Labs    03/08/18 1319 03/08/18 1339 03/09/18 0322 03/10/18 0437  CREATININE 8.01* 7.90* 8.39* 5.36*    Impression/Assessment:  Atonic bladder, chronic retention --  Plan:  Foley catheter inserted. F/u [facility] in 1 mo for cath change. Please page GU with any concerns, questions or changes in pt status.   Festus Aloe 03/10/2018, 10:15 PM

## 2018-03-10 NOTE — Consult Note (Signed)
[place] for Infectious Disease    Date of Admission:  03/08/2018     Total days of antibiotics                Reason for Consult: Group B Strep Bacteremia    Referring Provider: Hongalgi  Primary Care Provider: [name], MD   Assessment/Plan:  Mr. Corbitt is a 72 y/o male with previous medical history of ESRD on dialysis, DM, chronic foley catheter, history of DVT, and FFT presenting with altered mental status and found to have Group B streptococcus bacteremia of uncertain source with concern for his [facility] which is scheduled for removal. CT scan of the head negative for acute intracranial abnormalities. Urine also remains a concern with chronic foley catheter. He does have thrombocytopenia with Oncology consulting. 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 Vascular Surgery awaiting improvement in platelets to remove TDC.   1. Continue Cefazolin. 2. Repeat blood cultures in the morning. 3. Agree with TTE to rule out endocarditis.  4. Appreciate removing TDC. 5. 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 allopurinol  300 mg Oral Daily  . amiodarone  200 mg Oral Daily  . Chlorhexidine Gluconate Cloth  6 each Topical Q0600  . diclofenac sodium  2 g Topical TID  . feeding supplement (PRO-STAT SUGAR FREE 64)  30 mL Oral BID  . midodrine  5 mg Oral Q T,Th,Sa-HD  . oxybutynin  5 mg Oral  BID  . pantoprazole  40 mg Oral QHS  . sevelamer carbonate  2,400 mg Oral TID WC  . sodium chloride flush  10-40 mL Intracatheter Q12H  . sodium chloride flush  3 mL Intravenous Q12H  . sodium chloride flush  3 mL Intravenous Q12H     HPI: [facility] is a 72 y.o. male with previous medical history of hypertension, hyperlipidemia, GERD, ESRD on dialysis TTHS, Type 2 diabetes, CHF, s/p SDH, right femoral DVTand bladder cancer post resection who presented with confusion and "not acting himself" per the dialysis center. He is currently a resident of [facility] At dialysis noted to have a temperature of 100.8 and pulse of 102. He was noted to have a foul smelling catheter that is changed regularly. CT of the head in the ED was negative for acute abnormalities and chest x-ray showing bibasilar opacitities consistent with atelectasis but pneumonia was a concern. Blood cultures were obtained and he was started on cefepime and vancomycin. Blood cultures were positive for for Group B strep (agalactiae) with resistance to clindamycin, and erythromycin. Antibiotics were changed to Cefazolin to narrow. Neurology is planning to remve the [facility] and dialyzing through the AVG. TTE has been ordered.    Review of Systems: Review of Systems  Constitutional: Negative for chills and fever.  Respiratory: Negative for cough, shortness of breath and wheezing.   Cardiovascular: Negative for chest pain.  Gastrointestinal: Negative for  abdominal pain, constipation, diarrhea, nausea and vomiting.     Past Medical History:  Diagnosis Date  . Arthritis    Osteoarthritis  . Asthma   . Bladder cancer ([place]) dx'd 1990   surg only  . CHF (congestive heart failure) ([place])   . Diabetes mellitus without complication ([place])   . ESRD (end stage renal disease) ([name])    Tues, Thurs, Sat dialysis  . GERD (gastroesophageal reflux disease)   . History of cardiac catheterization    a. LHC 5/17: no obstructive CAD  .  History of DVT (deep vein thrombosis)    2003 ish  . History of nuclear stress test    a. Myoview 4/17: EF 39%, inf, inf-lat, apical inf, apical lat, apical scar, intermediate risk  . Hyperlipidemia   . Hypertension   . NICM (nonischemic cardiomyopathy) ([place])    a. Echo 2/17: mod LVH, EF 35-40%, inf-lat and inf-sept HK, mod MR, severe LAE, small pericardial effusion    Social History   Tobacco Use  . Smoking status: Former Smoker    Years: 30.00    Last attempt to quit: 10/12/1998    Years since quitting: 19.4  . Smokeless tobacco: Never Used  Substance Use Topics  . Alcohol use: No    Alcohol/week: 0.0 oz    Comment: Used to drink on the weekends, quit 2 years ago  . Drug use: No    Family History  Problem Relation Age of Onset  . Arthritis Mother   . Cancer Father   . Heart disease Father   . Hypertension Sister   . Alcohol abuse Brother   . Diabetes Daughter   . Heart attack Paternal Uncle     Allergies  Allergen Reactions  . Meperidine Shortness Of Breath and Other (See Comments)    Increased heart rate; Reaction to demerol  . Penicillins Other (See Comments)    Has patient had a PCN reaction causing immediate rash, facial/tongue/throat swelling, SOB or lightheadedness with hypotension: Yes (syncope) Has patient had a PCN reaction causing severe rash involving mucus membranes or skin necrosis: No Has patient had a PCN reaction that required hospitalization: No (treated at MD's office) Has patient had a PCN reaction occurring within the last 10 years: No If all of the above answers are "NO", then may proceed with Cephalosporin use.  . Sulfa Antibiotics Shortness Of Breath and Other (See Comments)    Increased heart rate  . Sulfonamide Derivatives Shortness Of Breath and Other (See Comments)    TACHYCARDIA  . [place] Diarrhea, Nausea And Vomiting and Other (See Comments)    Constipation Reaction to whole milk  . Lactose Intolerance (Gi) Diarrhea, Nausea And  Vomiting and Other (See Comments)    Constipation Reaction to whole milk  . Milk-Related Compounds Diarrhea, Nausea And Vomiting and Other (See Comments)    Constipation Reaction to whole milk  . Foltx [Elfolate Plus] Other (See Comments)    UNSPECIFIED REACTION  "Pt does not recall reaction to this"  . Food Other (See Comments)    POTATOES  Unknown reaction  . Orange Juice [Orange Oil] Other (See Comments)    Unknown reaction  . Tomato Other (See Comments)    Unknown reaction   . Betadine [Povidone Iodine] Itching, Rash and Other (See Comments)    burning  . Eggs Or Egg-Derived Products Nausea And Vomiting    Reaction to egg yolks - not whites      OBJECTIVE: Blood pressure 105/74, pulse 79,  temperature 98.1 F (36.7 C), resp. rate 18, height 5\' 7"  (1.702 m), weight 185 lb 3 oz (84 kg), SpO2 100 %.  Physical Exam  Constitutional: He is oriented to person, place, and time. He appears lethargic. He appears cachectic. No distress. Nasal cannula in place.  Lying in bed with head of bed up. Lethargic and requiring verbal stimulation.   Cardiovascular: Normal rate, regular rhythm and intact distal pulses. Exam reveals no gallop and no friction rub.  Murmur heard. Temporary dialysis catheter noted in left subclavian appears clean, dry and intact with no evidence of infection.   Pulmonary/Chest: Effort normal and breath sounds normal. No stridor. No respiratory distress. He has no wheezes. He has no rales. He exhibits no tenderness.  Abdominal: Soft. Bowel sounds are normal. He exhibits no distension. There is no tenderness.  Genitourinary:  Genitourinary Comments: Foley catheter present and appear patent. No urine in the bag or tube.   Neurological: He is oriented to person, place, and time. He appears lethargic.  Skin: Skin is warm and dry.  Psychiatric: He has a normal mood and affect. His behavior is normal. Judgment and thought content normal.    Lab Results Lab Results    Component Value Date   WBC 11.1 (H) 03/10/2018   HGB 9.8 (L) 03/10/2018   HCT 27.8 (L) 03/10/2018   MCV 79.9 03/10/2018   PLT 13 (LL) 03/10/2018    Lab Results  Component Value Date   CREATININE 5.36 (H) 03/10/2018   BUN 36 (H) 03/10/2018   NA 138 03/10/2018   K 3.8 03/10/2018   CL 92 (L) 03/10/2018   CO2 28 03/10/2018    Lab Results  Component Value Date   ALT 17 03/10/2018   AST 33 03/10/2018   ALKPHOS 58 03/10/2018   BILITOT 3.1 (H) 03/10/2018     Microbiology: Recent Results (from the past 240 hour(s))  Culture, blood (routine x 2)     Status: Abnormal   Collection Time: 03/08/18  2:30 PM  Result Value Ref Range Status   Specimen Description BLOOD RIGHT HAND  Final   Special Requests   Final    BOTTLES DRAWN AEROBIC AND ANAEROBIC Blood Culture results may not be optimal due to an inadequate volume of blood received in culture bottles Performed at [facility], [address], [place], [place] [address]    Culture  Setup Time   Final    IN BOTH AEROBIC AND ANAEROBIC BOTTLES GRAM POSITIVE COCCI Organism ID to follow CRITICAL RESULT CALLED TO, READ BACK BY AND VERIFIED WITH: L SEAY PHARMD 03/09/18 0239 JDW    Culture GROUP B STREP(S.AGALACTIAE)ISOLATED (A)  Final   Report Status 03/10/2018 FINAL  Final   Organism ID, Bacteria GROUP B STREP(S.AGALACTIAE)ISOLATED  Final      Susceptibility   Group b strep(s.agalactiae)isolated - MIC*    CLINDAMYCIN >=1 RESISTANT Resistant     AMPICILLIN <=0.25 SENSITIVE Sensitive     ERYTHROMYCIN >=8 RESISTANT Resistant     VANCOMYCIN 0.5 SENSITIVE Sensitive     CEFTRIAXONE <=0.12 SENSITIVE Sensitive     LEVOFLOXACIN 1 SENSITIVE Sensitive     * GROUP B STREP(S.AGALACTIAE)ISOLATED  Blood Culture ID Panel (Reflexed)     Status: Abnormal   Collection Time: 03/08/18  2:30 PM  Result Value Ref Range Status   Enterococcus species NOT DETECTED NOT DETECTED Final   Listeria monocytogenes NOT DETECTED NOT DETECTED Final    Staphylococcus species NOT DETECTED NOT DETECTED Final  Staphylococcus aureus NOT DETECTED NOT DETECTED Final   Streptococcus species DETECTED (A) NOT DETECTED Final    Comment: CRITICAL RESULT CALLED TO, READ BACK BY AND VERIFIED WITH: L SEAY PHARMD 03/09/18 0239 JDW    Streptococcus agalactiae DETECTED (A) NOT DETECTED Final    Comment: CRITICAL RESULT CALLED TO, READ BACK BY AND VERIFIED WITH: L SEAY PHARMD 03/09/18 0239 JDW    Streptococcus pneumoniae NOT DETECTED NOT DETECTED Final   Streptococcus pyogenes NOT DETECTED NOT DETECTED Final   Acinetobacter baumannii NOT DETECTED NOT DETECTED Final   Enterobacteriaceae species NOT DETECTED NOT DETECTED Final   Enterobacter cloacae complex NOT DETECTED NOT DETECTED Final   Escherichia coli NOT DETECTED NOT DETECTED Final   Klebsiella oxytoca NOT DETECTED NOT DETECTED Final   Klebsiella pneumoniae NOT DETECTED NOT DETECTED Final   Proteus species NOT DETECTED NOT DETECTED Final   Serratia marcescens NOT DETECTED NOT DETECTED Final   Haemophilus influenzae NOT DETECTED NOT DETECTED Final   Neisseria meningitidis NOT DETECTED NOT DETECTED Final   Pseudomonas aeruginosa NOT DETECTED NOT DETECTED Final   Candida albicans NOT DETECTED NOT DETECTED Final   Candida glabrata NOT DETECTED NOT DETECTED Final   Candida krusei NOT DETECTED NOT DETECTED Final   Candida parapsilosis NOT DETECTED NOT DETECTED Final   Candida tropicalis NOT DETECTED NOT DETECTED Final  Culture, blood (routine x 2)     Status: None (Preliminary result)   Collection Time: 03/08/18  3:00 PM  Result Value Ref Range Status   Specimen Description BLOOD RIGHT UPPER ARM  Final   Special Requests   Final    BOTTLES DRAWN AEROBIC AND ANAEROBIC Blood Culture adequate volume   Culture   Final    NO GROWTH < 24 HOURS Performed at [facility], [address] [place], [place] [address]    Report Status PENDING  Incomplete     Terri Piedra, [place] for  [place] Pager  03/10/2018  2:24 PM

## 2018-03-10 NOTE — Progress Notes (Signed)
Discontinued the old Foley.   Attempted to put new 16 Fr Foley but was unsuccessful, got resistance, and pt was in lot pain.  Paged Dr. Junious Silk, Urologist, and notified, he said he will come and put it in.

## 2018-03-10 NOTE — Progress Notes (Signed)
  Speech Language Pathology Treatment: Dysphagia  Patient Details Name: Albert [facility] MRN: [id number] DOB: 1945/12/18 Today's Date: 03/10/2018 Time: 6712-4580 SLP Time Calculation (min) (ACUTE ONLY): 15 min  Assessment / Plan / Recommendation Clinical Impression  Pt is alert this afternoon but his altered mentation still limits his oral acceptance and preparation. He needs Max encouragement to try POs. His swallow appears to occur swiftly with thin liquids, but with purees and solids he has oral holding. Mod cues were provided for sustained attention to bolus formation and for clearance of oral residuals. Only one cough was noted as pt was using thin liquid as a liquid wash to clear his oral cavity. Although pt still is not ready for full meal trays, RN reports difficulty maintaining his blood sugars and asks if any POs can be initiated. Recommend continuing meds crushed in puree but would also allow sips of liquids (water, Ensure) with nursing only in order to monitor for signs of aspiration. SLP will f/u to assess readiness to start a diet.   HPI HPI: 72 year old male admitted with AMS, fever, concern for sepsis. CT Chest showed bibasliar opacities most likely representing atelectasis although subtle bibasilar PNA coudln't be excluded. PMH: DM, ESRD, GERD, HLD, HTN      SLP Plan  Continue with current plan of care       Recommendations  Diet recommendations: NPO;Other(comment)(bites of puree/sips of liquid with RN only) Liquids provided via: Cup Medication Administration: Crushed with puree                Oral Care Recommendations: Oral care QID Follow up Recommendations: 24 hour supervision/assistance;Skilled Nursing facility SLP Visit Diagnosis: Dysphagia, unspecified (R13.10) Plan: Continue with current plan of care       GO                Germain Osgood 03/10/2018, 2:07 PM  Germain Osgood, M.A. CCC-SLP [phone]

## 2018-03-10 NOTE — Progress Notes (Addendum)
[facility] Progress Note  Subjective: less agitated today still not following commands  Vitals:   03/10/18 0504 03/10/18 0855 03/10/18 1125 03/10/18 1145  BP: 132/81 138/78 106/72 98/66  Pulse: 98 94 81 74  Resp: (!) 21 20 16 16   Temp: 98.6 F (37 C) 98.4 F (36.9 C) 97.8 F (36.6 C) 98.2 F (36.8 C)  TempSrc: Oral Oral    SpO2: 100% 100% 100% 100%  Weight:      Height:        Inpatient medications: . allopurinol  300 mg Oral Daily  . amiodarone  200 mg Oral Daily  . Chlorhexidine Gluconate Cloth  6 each Topical Q0600  . diclofenac sodium  2 g Topical TID  . feeding supplement (PRO-STAT SUGAR FREE 64)  30 mL Oral BID  . midodrine  5 mg Oral Q T,Th,Sa-HD  . oxybutynin  5 mg Oral BID  . pantoprazole  40 mg Oral QHS  . sevelamer carbonate  2,400 mg Oral TID WC  . sodium chloride flush  10-40 mL Intracatheter Q12H  . sodium chloride flush  3 mL Intravenous Q12H  . sodium chloride flush  3 mL Intravenous Q12H   . sodium chloride    . sodium chloride    .  ceFAZolin (ANCEF) IV     sodium chloride, acetaminophen, ondansetron **OR** ondansetron (ZOFRAN) IV, sodium chloride flush, sodium chloride flush  Exam: Gen confused still less agitated not following commands mumbling No jvd or bruits Chest clear bilat to bases RRR no MRG Abd soft ntnd no mass or ascites +bs obese GU normal male w foley cath in place MS no joint effusions or deformity Ext 1+ bilat LE edema better than usual, no wounds or ulcers Neuro moves all ext Left arm AVG +bruit/ L IJ cath clean exit site    Dialysis: tts [facility]  4h   88.5kg  Profile 4  2/2 bath heparin none (due to hx SDH)  lua avg / L IJ tdc - meds pending    Impression: 1 altered mental status/ fevers/ group B strep bacteremia - unclear source urinalysis/ urine cx not sent will plan to have tdc removed he has an avg which we will use for dialysis 2 esrd on hd tts - plan hd tomorrow off sched then sat to get back on  sched 3 hx bladder mass / indwelling foley - changed monthly f/b urology 4 hx dvt recurrent sp recent ivc filter 5 nicm hx chron vol overload 6 hypotension - chronic on midodrine 8 hist recurrent hypoglycemia - not an issue here not on insulin hx at snf 9 do not resuscitate    Plan - dialysis tomorrow then sat to get back on sched, cath removal    Kelly Splinter MD [facility] pager [phone]   03/10/2018, 1:13 PM   Recent Labs  Lab 03/08/18 1319 03/08/18 1339 03/09/18 0322 03/10/18 0437  NA 134* 132* 135 138  K 4.4 4.3 4.3 3.8  CL 92* 95* 91* 92*  CO2 24  --  27 28  GLUCOSE 140* 136* 157* 47*  BUN 63* 54* 72* 36*  CREATININE 8.01* 7.90* 8.39* 5.36*  CALCIUM 9.3  --  9.1 9.0   Recent Labs  Lab 03/08/18 1319 03/10/18 0437  AST 31 33  ALT 18 17  ALKPHOS 72 58  BILITOT 2.9* 3.1*  PROT 5.5* 5.2*  ALBUMIN 2.3* 1.9*   Recent Labs  Lab 03/08/18 1319  03/09/18 0322 03/09/18 1843 03/10/18 7824  WBC 6.8  --  10.3 10.3 11.1*  NEUTROABS 6.1  --   --   --  8.0*  HGB 10.6*   < > 9.5* 9.6* 9.8*  HCT 30.7*   < > 27.1* 27.2* 27.8*  MCV 81.6  --  81.6 79.1 79.9  PLT 25*  --  14* 20* 13*   < > = values in this interval not displayed.   Iron/TIBC/Ferritin/ %Sat    Component Value Date/Time   IRON 90 12/18/2017 1914   TIBC 183 (L) 12/18/2017 1914   FERRITIN 544 (H) 12/07/2015 2122   IRONPCTSAT 49 (H) 12/18/2017 1914   IRONPCTSAT 24 05/03/2006 1529

## 2018-03-11 ENCOUNTER — Encounter (HOSPITAL_COMMUNITY): Payer: Self-pay | Admitting: *Deleted

## 2018-03-11 ENCOUNTER — Inpatient Hospital Stay (HOSPITAL_COMMUNITY): Payer: Medicare Other

## 2018-03-11 DIAGNOSIS — M5136 Other intervertebral disc degeneration, lumbar region: Secondary | ICD-10-CM

## 2018-03-11 DIAGNOSIS — D696 Thrombocytopenia, unspecified: Secondary | ICD-10-CM

## 2018-03-11 DIAGNOSIS — Z7189 Other specified counseling: Secondary | ICD-10-CM

## 2018-03-11 DIAGNOSIS — R7881 Bacteremia: Secondary | ICD-10-CM

## 2018-03-11 DIAGNOSIS — E1121 Type 2 diabetes mellitus with diabetic nephropathy: Secondary | ICD-10-CM

## 2018-03-11 DIAGNOSIS — D689 Coagulation defect, unspecified: Secondary | ICD-10-CM

## 2018-03-11 DIAGNOSIS — E440 Moderate protein-calorie malnutrition: Secondary | ICD-10-CM

## 2018-03-11 LAB — HEPATIC FUNCTION PANEL
ALBUMIN: 1.9 g/dL — AB (ref 3.5–5.0)
ALT: 11 U/L — ABNORMAL LOW (ref 17–63)
AST: 32 U/L (ref 15–41)
Alkaline Phosphatase: 65 U/L (ref 38–126)
BILIRUBIN INDIRECT: 1.4 mg/dL — AB (ref 0.3–0.9)
BILIRUBIN TOTAL: 3.1 mg/dL — AB (ref 0.3–1.2)
Bilirubin, Direct: 1.7 mg/dL — ABNORMAL HIGH (ref 0.1–0.5)
Total Protein: 4.7 g/dL — ABNORMAL LOW (ref 6.5–8.1)

## 2018-03-11 LAB — CBC
HCT: 26.1 % — ABNORMAL LOW (ref 39.0–52.0)
Hemoglobin: 9.3 g/dL — ABNORMAL LOW (ref 13.0–17.0)
MCH: 28.4 pg (ref 26.0–34.0)
MCHC: 35.6 g/dL (ref 30.0–36.0)
MCV: 79.6 fL (ref 78.0–100.0)
Platelets: 19 10*3/uL — CL (ref 150–400)
RBC: 3.28 MIL/uL — ABNORMAL LOW (ref 4.22–5.81)
RDW: 18.9 % — AB (ref 11.5–15.5)
WBC: 10.1 10*3/uL (ref 4.0–10.5)

## 2018-03-11 LAB — BASIC METABOLIC PANEL
Anion gap: 14 (ref 5–15)
BUN: 51 mg/dL — ABNORMAL HIGH (ref 6–20)
CHLORIDE: 94 mmol/L — AB (ref 101–111)
CO2: 29 mmol/L (ref 22–32)
Calcium: 8.8 mg/dL — ABNORMAL LOW (ref 8.9–10.3)
Creatinine, Ser: 6.33 mg/dL — ABNORMAL HIGH (ref 0.61–1.24)
GFR, EST AFRICAN AMERICAN: 9 mL/min — AB (ref >60)
GFR, EST NON AFRICAN AMERICAN: 8 mL/min — AB (ref >60)
Glucose, Bld: 89 mg/dL (ref 65–99)
Potassium: 4.1 mmol/L (ref 3.5–5.1)
SODIUM: 137 mmol/L (ref 135–145)

## 2018-03-11 LAB — PREPARE PLATELET PHERESIS: UNIT DIVISION: 0

## 2018-03-11 LAB — BPAM PLATELET PHERESIS
Blood Product Expiration Date: 201905302359
ISSUE DATE / TIME: 201905301119
Unit Type and Rh: 1700

## 2018-03-11 LAB — ECHOCARDIOGRAM COMPLETE
HEIGHTINCHES: 67 in
Weight: 2962.98 oz

## 2018-03-11 LAB — GLUCOSE, CAPILLARY
GLUCOSE-CAPILLARY: 83 mg/dL (ref 65–99)
GLUCOSE-CAPILLARY: 84 mg/dL (ref 65–99)
Glucose-Capillary: 96 mg/dL (ref 65–99)
Glucose-Capillary: 97 mg/dL (ref 65–99)

## 2018-03-11 LAB — HAPTOGLOBIN: HAPTOGLOBIN: 84 mg/dL (ref 34–200)

## 2018-03-11 MED ORDER — SODIUM CHLORIDE 0.9 % IV SOLN: Freq: Once | INTRAVENOUS | Status: DC

## 2018-03-11 MED ORDER — PHYTONADIONE 5 MG PO TABS
5.0000 mg | ORAL_TABLET | Freq: Every day | ORAL | Status: AC
  Administered 2018-03-11 – 2018-03-13 (×2): 5 mg via ORAL
  Filled 2018-03-11 (×3): qty 1

## 2018-03-11 MED ORDER — CHLORHEXIDINE GLUCONATE CLOTH 2 % EX PADS
6.0000 | MEDICATED_PAD | Freq: Every day | CUTANEOUS | Status: DC
  Administered 2018-03-13 – 2018-03-15 (×3): 6 via TOPICAL

## 2018-03-11 MED ORDER — CALCITRIOL 0.5 MCG PO CAPS
2.0000 ug | ORAL_CAPSULE | ORAL | Status: DC
  Administered 2018-03-15: 2 ug via ORAL
  Filled 2018-03-11: qty 4

## 2018-03-11 MED ORDER — DARBEPOETIN ALFA 200 MCG/0.4ML IJ SOSY: 200.0000 ug | PREFILLED_SYRINGE | INTRAMUSCULAR | Status: DC

## 2018-03-11 NOTE — Progress Notes (Signed)
[facility] for [facility]ious Disease    Date of Admission:  03/08/2018   Total days of antibiotics 4        Day 3 cefazolin   ID: [facility] is a 72 y.o. 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 allopurinol  300 mg Oral Daily  . amiodarone  200 mg Oral Daily  . [START ON 2018/04/04] calcitRIOL  2 mcg Oral Q T,Th,Sa-HD  . Chlorhexidine Gluconate Cloth  6 each Topical Q0600  . [START ON 03/17/2018] darbepoetin (ARANESP) injection - DIALYSIS  200 mcg Intravenous Q Thu-HD  . diclofenac sodium  2 g Topical TID  . feeding supplement (PRO-STAT SUGAR FREE 64)  30 mL Oral BID  . midodrine  5 mg Oral Q T,Th,Sa-HD  . oxybutynin  5 mg Oral BID  . pantoprazole  40 mg Oral QHS  . phytonadione  5 mg Oral Daily  . sevelamer carbonate  2,400 mg Oral TID WC  . sodium chloride flush  10-40 mL Intracatheter Q12H  . sodium chloride flush  3 mL Intravenous Q12H  . sodium chloride flush  3 mL Intravenous Q12H    Objective: Vital signs in last 24 hours: Temp:  [97.5 F (36.4 C)-98.8 F (37.1 C)] 97.7 F (36.5 C) (05/31 1601) Pulse Rate:  [77-88] 78 (05/31 1601) Resp:  [14-18]  14 (05/31 1601) BP: (109-143)/(66-81) 113/71 (05/31 1601) SpO2:  [91 %-100 %] 98 % (05/31 1601)   Lab Results Recent Labs    03/10/18 0437 03/10/18 1617 03/11/18 0649  WBC 11.1* 10.0 10.1  HGB 9.8* 9.2* 9.3*  HCT 27.8* 26.1* 26.1*  NA 138  --  137  K 3.8  --  4.1  CL 92*  --  94*  CO2 28  --  29  BUN 36*  --  51*  CREATININE 5.36*  --  6.33*   Liver Panel Recent Labs    03/10/18 0437 03/11/18 0649  PROT 5.2* 4.7*  ALBUMIN 1.9* 1.9*  AST 33 32  ALT 17 11*  ALKPHOS 58 65  BILITOT 3.1* 3.1*  BILIDIR  --  1.7*  IBILI  --  1.4*    Microbiology:  5/28 blood cx 1 of 2 sets + GBS 5/31 blood cx pending Studies/Results: No results found.   Assessment/Plan: Group b strep bacteremia = unclear source though has hd temp catheter and urinary catheter in place. His urinary catheter has been exchanged. Awaiting for platelet count to improve so that can safely remove his hd catheter.  If repeat blood cx remain NGTD, would treat with 14 days of cefazolin can give with HD (so that no other lines need to  be placed), using the day after removal of HD catheter as day 1  Thrombocytopenia = thought to be due to DIC. Hopefully to quickly resolve as we are treating his bacteremia  [facility] Cell: [phone] Pager: [phone]  03/11/2018, 5:33 PM

## 2018-03-11 NOTE — Progress Notes (Addendum)
PROGRESS NOTE   Albert Stanley  EQA:[id number]    DOB: 01-16-1946    DOA: 03/08/2018  PCP: [name], MD   I have briefly reviewed patients previous medical records in [facility].  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 [name]), frequent hospitalizations (this is his sixth in the last 6 months), recently in 4/30-5/3 for hypoglycemia presented from HD center due to confusion and not acting himself, low-grade fever and admitted for sepsis due to group B strep bacteremia.  Nephrology and hematology consulted.   Assessment & Plan:   Principal Problem:   Sepsis due to undetermined organism with metabolic encephalopathy ([place]) Active Problems:   Lumbar degenerative disc disease   History of DVT (deep vein thrombosis)   End-stage renal disease on hemodialysis ([name])   Dyslipidemia associated with type 2 diabetes mellitus ([place])   Diabetes mellitus with diabetic nephropathy without long-term current use of insulin ([name])   Anemia associated with chronic renal failure   Malnutrition of moderate degree   S/P craniotomy   Dysphagia   Benign essential HTN   Foley catheter problem ([name])   Thrombocytopenia ([name])   Coagulopathy ([name])   Sepsis due to group B strep (strep agalactiae) bacteremia: 1 of 2 blood cultures confirms group B strep, resistant to clindamycin and erythromycin but sensitive to ampicillin, ceftriaxone.  Patient has indwelling Foley catheter, left TDC and AVG for HD which could be potential sources but do not look overtly infected.  Elevated lactate and procalcitonin.  CT chest suggested more atelectasis rather than  pneumonia.  He had been empirically started on IV vancomycin and cefepime.  Based on culture results to IV cefazolin on 5/30 and tolerating same.  TDC to be removed after platelets >30 K posttransfusion.  Foley catheter changed by urology 5/30.  TTE 5/30 does not comment about vegetations.  Acute toxic metabolic encephalopathy: Likely secondary to acute infection.  CT head without acute findings.  No focal deficits.  Treat underlying cause and monitor closely.  P.o. except medications until consistently awake and alert to tolerate oral diet.  Mental status not consistently better, waxing and waning, worse this morning than yesterday.  Thrombocytopenia: On chart review, patient has had chronic intermittent mild thrombocytopenia in the past and coagulopathy.  He presented with platelets of 25K which is dropped to 14, INR 1.9, schistocytes noted on CBC 5/28.  I have consulted Dr. Irene Limbo, Hematology discussed with him in detail on 5/30.  He suspects sepsis related DIC and antibiotics as the cause of patient's thrombocytopenia and less likely from TTP.  Normal fibrinogen, mildly elevated LDH.  If platelets <20 or if bleeding, recommends transfusing 1 bag of platelets.  If bleeding, may need to consider FFP and if bleeding and low fibrinogen may also need to consider cryoprecipitate.  Post one unit platelet transfusion, platelets had improved from 13-22 on 5/30 but dropped again to 19 this morning.  Transfusing 2 units of platelets to try and get >34 TDC removal.  No bleeding reported.  Also started vitamin K for elevated INR/coagulopathy to prevent bleeding at [facility] removal.  ESRD on TTS HD: Nephrology consulted and underwent HD 5/29.  Nephrology postponed HD today to 5/31 via  AV graft.  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 [name], Urology).  Consulted urology who changed Foley catheter  on 5/30.  Continue monthly outpatient change by urology.  History of recurrent DVT: Status post IVC filter.  NICM/chronic systolic CHF: Chronic volume overload, managed across dialysis but no overt pulmonary edema.  Volume status better than on admission.  TTE 5/31: LVEF 25% and diffuse hypokinesis.  PVCs: Remains on amiodarone.  Chronic hypotension: Midodrine.  Blood pressures have been normal over the last 48 hours.  Type II DM with recurrent hypoglycemia: Ongoing intermittent hypoglycemia but none since.  Diet as tolerated.  Dysphagia: Speech therapy input appreciated and have transitioned to dysphagia 1 diet and thin liquids on 5/31.  Anemia in ESRD: Stable.  History of SDH status post craniotomy: Repeat CT head without acute findings.  Adult FTT: I discussed in detail with patient's spouse on 5/30 and outlined overall poor long-term prognosis given advanced age, multiple significant severe comorbidities, frequent hospitalizations.  Requested palliative care for goals of care, she was agreeable.  Isolated hyperbilirubinemia:?  Related to sepsis related DIC.  LFT shows mixed direct and indirect hyperbilirubinemia.  NSVT: Noted on 5/31.  Likely due to cardiomyopathy.  Potassium and magnesium okay.   DVT prophylaxis: SCDs Code Status: DNR Family Communication: None at bedside today. Disposition: To be determined   Consultants:  Neurology Hematology Palliative care team for goals of care. Infectious disease.  Procedures:  Hemodialysis  Antimicrobials:  IV vancomycin and cefepime, discontinued. IV cefazolin 5/30 >   Subjective: Somnolent this morning.  Arousable briefly to call but not interactive.  As per RN, waxing and waning mental status.  Patient examined along with nephrology team.  ROS: Unable.  Objective:  Vitals:   03/11/18 1414 03/11/18 1436 03/11/18 1552 03/11/18 1601  BP: 130/73 118/71 110/76 113/71  Pulse: 83 85 78 78  Resp: 18 18 18 14   Temp: 97.6  F (36.4 C) (!) 97.5 F (36.4 C) (!) 97.5 F (36.4 C) 97.7 F (36.5 C)  TempSrc: Axillary Axillary Axillary Oral  SpO2: 92%  100% 98%  Weight:      Height:        Examination:  General exam: Elderly male, moderately built and nourished, chronically ill looking, lying in bed, worse again today than he looked yesterday. Respiratory system: Clear to auscultation. Respiratory effort normal.  Left TDC site without acute findings.  Stable Cardiovascular system: S1 & S2 heard, RRR. No JVD, murmurs, rubs, gallops or clicks.  1+ pedal edema, better than yesterday.  Telemetry personally reviewed: Sinus rhythm.  16 beat nonsustained VT at 3:59 AM. Gastrointestinal system: Abdomen is nondistended, soft and nontender. No organomegaly or masses felt. Normal bowel sounds heard.  Stable Central nervous system: Mental status as indicated above.  No focal neurological deficits.  Does not follow instructions today. Extremities: Moves all extremities symmetrically.  Left upper extremity AVG without acute findings.  Has mittens on both hands.  No change. Skin: No rashes, lesions or ulcers Psychiatry: Judgement and insight impaired. Mood & affect cannot be assessed.    Data Reviewed: I have personally reviewed following labs and imaging studies  CBC: Recent Labs  Lab 03/08/18 1319  03/09/18 0322 03/09/18 1843 03/10/18 0437 03/10/18 1617 03/11/18 0649  WBC 6.8  --  10.3 10.3 11.1* 10.0 10.1  NEUTROABS 6.1  --   --   --  8.0*  --   --  HGB 10.6*   < > 9.5* 9.6* 9.8* 9.2* 9.3*  HCT 30.7*   < > 27.1* 27.2* 27.8* 26.1* 26.1*  MCV 81.6  --  81.6 79.1 79.9 79.6 79.6  PLT 25*  --  14* 20* 13* 22* 19*   < > = values in this interval not displayed.   Basic Metabolic Panel: Recent Labs  Lab 03/08/18 1319 03/08/18 1339 03/09/18 0322 03/10/18 0437 03/11/18 0649  NA 134* 132* 135 138 137  K 4.4 4.3 4.3 3.8 4.1  CL 92* 95* 91* 92* 94*  CO2 24  --  27 28 29   GLUCOSE 140* 136* 157* 47* 89  BUN 63*  54* 72* 36* 51*  CREATININE 8.01* 7.90* 8.39* 5.36* 6.33*  CALCIUM 9.3  --  9.1 9.0 8.8*  MG  --   --  2.1  --   --    Liver Function Tests: Recent Labs  Lab 03/08/18 1319 03/10/18 0437 03/11/18 0649  AST 31 33 32  ALT 18 17 11*  ALKPHOS 72 58 65  BILITOT 2.9* 3.1* 3.1*  PROT 5.5* 5.2* 4.7*  ALBUMIN 2.3* 1.9* 1.9*   Coagulation Profile: Recent Labs  Lab 03/08/18 1655  INR 1.99   Cardiac Enzymes: No results for input(s): CKTOTAL, CKMB, CKMBINDEX, TROPONINI in the last 168 hours. HbA1C: No results for input(s): HGBA1C in the last 72 hours. CBG: Recent Labs  Lab 03/10/18 0820 03/10/18 1145 03/10/18 1710 03/10/18 2054 03/11/18 0735  GLUCAP 102* 77 77 92 83    Recent Results (from the past 240 hour(s))  Culture, blood (routine x 2)     Status: Abnormal   Collection Time: 03/08/18  2:30 PM  Result Value Ref Range Status   Specimen Description BLOOD RIGHT HAND  Final   Special Requests   Final    BOTTLES DRAWN AEROBIC AND ANAEROBIC Blood Culture results may not be optimal due to an inadequate volume of blood received in culture bottles Performed at [facility] [address], [place], [place] [address]    Culture  Setup Time   Final    IN BOTH AEROBIC AND ANAEROBIC BOTTLES GRAM POSITIVE COCCI Organism ID to follow CRITICAL RESULT CALLED TO, READ BACK BY AND VERIFIED WITH: L SEAY PHARMD 03/09/18 0239 JDW    Culture GROUP B STREP(S.AGALACTIAE)ISOLATED (A)  Final   Report Status 03/10/2018 FINAL  Final   Organism ID, Bacteria GROUP B STREP(S.AGALACTIAE)ISOLATED  Final      Susceptibility   Group b strep(s.agalactiae)isolated - MIC*    CLINDAMYCIN >=1 RESISTANT Resistant     AMPICILLIN <=0.25 SENSITIVE Sensitive     ERYTHROMYCIN >=8 RESISTANT Resistant     VANCOMYCIN 0.5 SENSITIVE Sensitive     CEFTRIAXONE <=0.12 SENSITIVE Sensitive     LEVOFLOXACIN 1 SENSITIVE Sensitive     * GROUP B STREP(S.AGALACTIAE)ISOLATED  Blood Culture ID Panel (Reflexed)      Status: Abnormal   Collection Time: 03/08/18  2:30 PM  Result Value Ref Range Status   Enterococcus species NOT DETECTED NOT DETECTED Final   Listeria monocytogenes NOT DETECTED NOT DETECTED Final   Staphylococcus species NOT DETECTED NOT DETECTED Final   Staphylococcus aureus NOT DETECTED NOT DETECTED Final   Streptococcus species DETECTED (A) NOT DETECTED Final    Comment: CRITICAL RESULT CALLED TO, READ BACK BY AND VERIFIED WITH: L SEAY PHARMD 03/09/18 0239 JDW    Streptococcus agalactiae DETECTED (A) NOT DETECTED Final    Comment: CRITICAL RESULT CALLED TO, READ BACK  BY AND VERIFIED WITH: L SEAY PHARMD 03/09/18 0239 JDW    Streptococcus pneumoniae NOT DETECTED NOT DETECTED Final   Streptococcus pyogenes NOT DETECTED NOT DETECTED Final   Acinetobacter baumannii NOT DETECTED NOT DETECTED Final   Enterobacteriaceae species NOT DETECTED NOT DETECTED Final   Enterobacter cloacae complex NOT DETECTED NOT DETECTED Final   Escherichia coli NOT DETECTED NOT DETECTED Final   Klebsiella oxytoca NOT DETECTED NOT DETECTED Final   Klebsiella pneumoniae NOT DETECTED NOT DETECTED Final   Proteus species NOT DETECTED NOT DETECTED Final   Serratia marcescens NOT DETECTED NOT DETECTED Final   Haemophilus influenzae NOT DETECTED NOT DETECTED Final   Neisseria meningitidis NOT DETECTED NOT DETECTED Final   Pseudomonas aeruginosa NOT DETECTED NOT DETECTED Final   Candida albicans NOT DETECTED NOT DETECTED Final   Candida glabrata NOT DETECTED NOT DETECTED Final   Candida krusei NOT DETECTED NOT DETECTED Final   Candida parapsilosis NOT DETECTED NOT DETECTED Final   Candida tropicalis NOT DETECTED NOT DETECTED Final  Culture, blood (routine x 2)     Status: None (Preliminary result)   Collection Time: 03/08/18  3:00 PM  Result Value Ref Range Status   Specimen Description BLOOD RIGHT UPPER ARM  Final   Special Requests   Final    BOTTLES DRAWN AEROBIC AND ANAEROBIC Blood Culture adequate volume    Culture   Final    NO GROWTH 3 DAYS Performed at [facility], [address] [place], [place] [address]    Report Status PENDING  Incomplete         Radiology Studies: No results found.      Scheduled Meds: . allopurinol  300 mg Oral Daily  . amiodarone  200 mg Oral Daily  . [START ON 03-19-2018] calcitRIOL  2 mcg Oral Q T,Th,Sa-HD  . Chlorhexidine Gluconate Cloth  6 each Topical Q0600  . [START ON 03/17/2018] darbepoetin (ARANESP) injection - DIALYSIS  200 mcg Intravenous Q Thu-HD  . diclofenac sodium  2 g Topical TID  . feeding supplement (PRO-STAT SUGAR FREE 64)  30 mL Oral BID  . midodrine  5 mg Oral Q T,Th,Sa-HD  . oxybutynin  5 mg Oral BID  . pantoprazole  40 mg Oral QHS  . phytonadione  5 mg Oral Daily  . sevelamer carbonate  2,400 mg Oral TID WC  . sodium chloride flush  10-40 mL Intracatheter Q12H  . sodium chloride flush  3 mL Intravenous Q12H  . sodium chloride flush  3 mL Intravenous Q12H   Continuous Infusions: . sodium chloride    . sodium chloride    . sodium chloride    .  ceFAZolin (ANCEF) IV Stopped (03/10/18 2200)     LOS: 3 days     Vernell Leep, MD, FACP, [facility]. [facility] Pager [phone]  If 7PM-7AM, please contact night-coverage [email or web address] Password [email or web address] 03/11/2018, 5:18 PM

## 2018-03-11 NOTE — Progress Notes (Signed)
Pharmacy Antibiotic Note  Albert Stanley is a 72 y.o. male admitted on 03/08/2018 with concern for sepsis and now found to have 2 of 4 blood cultures growing GPC with BCID showing Strep agalactiae. The patient does have a PCN allergy listed but has tolerated cephalosporins previously - pharmacy consulted to narrow antibiotics to Cefazolin.  The patient normally dialyzes TTS but is noted to be off-schedule and received HD 5/29, planning again off-schedule 5/31 then back to TTS starting on 6/1.  Noted ID consulted and recommended narrowing to Ampicillin however with PCN allergy, likely need to continue on Cefazolin for now. Will f/u results of TTE for evaluation of LOT.  Plan: - Continue Cefazolin 1g IV every 24 hours  - Will transition to HD dosing when HD schedule appears to be more consistent  - Will continue to follow HD schedule/duration, culture results, LOT, and antibiotic de-escalation plans   Temp (24hrs), Avg:98.1 F (36.7 C), Min:97.7 F (36.5 C), Max:98.8 F (37.1 C)  Recent Labs  Lab 03/08/18 1319 03/08/18 1339 03/08/18 1340 03/08/18 1515 03/08/18 1706 03/09/18 0322 03/09/18 1843 03/10/18 0437 03/10/18 1617 03/11/18 0649  WBC 6.8  --   --   --   --  10.3 10.3 11.1* 10.0 10.1  CREATININE 8.01* 7.90*  --   --   --  8.39*  --  5.36*  --  6.33*  LATICACIDVEN  --   --  4.51* 3.33* 3.40*  --   --   --   --   --     Estimated Creatinine Clearance: 10.9 mL/min (A) (by C-G formula based on SCr of 6.33 mg/dL (H)).    Allergies  Allergen Reactions  . Meperidine Shortness Of Breath and Other (See Comments)    Increased heart rate; Reaction to demerol  . Penicillins Other (See Comments)    Tolerates cefazolin  Has patient had a PCN reaction causing immediate rash, facial/tongue/throat swelling, SOB or lightheadedness with hypotension: Yes (syncope) Has patient had a PCN reaction causing severe rash involving mucus membranes or skin necrosis: No Has patient had a PCN  reaction that required hospitalization: No (treated at MD's office) Has patient had a PCN reaction occurring within the last 10 years: No If all of the above answers are "NO", then may proceed with Cephalosporin use.  . Sulfa Antibiotics Shortness Of Breath and Other (See Comments)    Increased heart rate  . Sulfonamide Derivatives Shortness Of Breath and Other (See Comments)    TACHYCARDIA  . Lac Bovis Diarrhea, Nausea And Vomiting and Other (See Comments)    Constipation Reaction to whole milk  . Lactose Intolerance (Gi) Diarrhea, Nausea And Vomiting and Other (See Comments)    Constipation Reaction to whole milk  . Milk-Related Compounds Diarrhea, Nausea And Vomiting and Other (See Comments)    Constipation Reaction to whole milk  . Foltx [Elfolate Plus] Other (See Comments)    UNSPECIFIED REACTION  "Pt does not recall reaction to this"  . Food Other (See Comments)    POTATOES  Unknown reaction  . Orange Juice [Orange Oil] Other (See Comments)    Unknown reaction  . Tomato Other (See Comments)    Unknown reaction   . Betadine [Povidone Iodine] Itching, Rash and Other (See Comments)    burning  . Eggs Or Egg-Derived Products Nausea And Vomiting    Reaction to egg yolks - not whites     Antimicrobials this admission: Vanc 5/28x 1 Cefepime 5/28 x1 Cefazolin 5/29 >>  Dose adjustments this admission: None  Microbiology results: 5/28 BCx: 2/4 GPC (BCID Strep agalactiae) 5/28 UCx >> 5/31 BCx >>  Thank you for allowing pharmacy to be a part of this patient's care.  Alycia Rossetti, PharmD, BCPS Clinical Pharmacist Pager: [phone] Clinical phone for 03/11/2018 from 7a-3:30p: [phone] If after 3:30p, please call main pharmacy at: [phone] 03/11/2018 10:14 AM

## 2018-03-11 NOTE — Clinical Social Work Note (Signed)
CSW talked with patient's wife and daughter (after 5 pm) regarding patient's current health issues and the possible need in the future for ALF placement. This was discussed and an ALF list was provided. Mrs. Dani also had questions about Medicaid and LTC placement and CSW provided information and also suggested that they speak with someone at the [facility] in [facility] [place]. Wife and daughter expressed appreciation for CSW's time and information provided. CSW will continue to follow and provide CSW intervention services as needed and facilitate d/c back to [facility] when medically stable.  Homero Hyson Givens, MSW, LCSW Licensed Clinical Social Worker [place] [phone]

## 2018-03-11 NOTE — Evaluation (Signed)
Physical Therapy Evaluation Patient Details Name: Albert Stanley MRN: [id number] DOB: Dec 26, 1945 Today's Date: 03/11/2018   History of Present Illness  Pt. is a 72 y.o. M with significant PMH of ESRD on TTS HD, chronic hypotension on midodrine, DM type II, NICM EF 40%, chronic systolic CHF, SDH 06/8118 s/p craniotomy, right femoral DVT s/p IVC filter, urinary retention, bladder cancer status post resection, renal mass, recent hospitalization 02/03/2018-02/07/2018 for refractory hypoglycemia. Presenting with confusion and another episode of hypoglycemia.   Clinical Impression  Patient has unfortunately had a significant regression in functional mobility and cognition noted since previous admission last month. Patient is from [facility] and previously transferring with assistance using a slide board from the bed to his wheelchair. On PT evaluation, patient was not oriented and inconsistently following simple commands. Requiring two person total assistance for bed mobility and for sitting balance edge of bed. Recommending return to SNF at d/c. Will follow acutely to progress mobility.     Follow Up Recommendations SNF    Equipment Recommendations  None recommended by PT    Recommendations for Other Services       Precautions / Restrictions Precautions Precautions: Fall Restrictions Weight Bearing Restrictions: No      Mobility  Bed Mobility Overal bed mobility: Needs Assistance Bed Mobility: Supine to Sit;Sit to Supine     Supine to sit: Total assist;+2 for physical assistance Sit to supine: Total assist;+2 for physical assistance   General bed mobility comments: total assist (>75%) for supine <> sit with BLE management and elevating trunk. Use of bed pad to shift hips toward EOB.  Transfers                 General transfer comment: not atttempted due to lack of sitting balance  Ambulation/Gait                Stairs            Wheelchair Mobility     Modified Rankin (Stroke Patients Only)       Balance Overall balance assessment: Needs assistance Sitting-balance support: Feet unsupported;No upper extremity supported Sitting balance-Leahy Scale: Poor Sitting balance - Comments: requiring up to max assist for balance. repeatedly elevating legs and scratching with upper extremities which caused posterior lean                                     Pertinent Vitals/Pain Pain Assessment: Faces Faces Pain Scale: No hurt    Home Living Family/patient expects to be discharged to:: Skilled nursing facility                      Prior Function Level of Independence: Needs assistance   Gait / Transfers Assistance Needed: transfers only to wheelchair using slide board with assist  ADL's / Homemaking Assistance Needed: dependent         Hand Dominance   Dominant Hand: Right    Extremity/Trunk Assessment   Upper Extremity Assessment Upper Extremity Assessment: Generalized weakness;LUE deficits/detail LUE Deficits / Details: greater LUE strength deficits secondary to SDH on 11/2017    Lower Extremity Assessment Lower Extremity Assessment: Generalized weakness;RLE deficits/detail RLE Deficits / Details: R knee crepitus and decreased knee flexion ~70 degrees seated LLE Deficits / Details: greater strength deficits on L due to R SDH 11/2017.     Cervical / Trunk Assessment Cervical / Trunk Assessment: Kyphotic  Communication   Communication: No difficulties  Cognition Arousal/Alertness: Awake/alert Behavior During Therapy: WFL for tasks assessed/performed Overall Cognitive Status: Impaired/Different from baseline Area of Impairment: Orientation;Memory;Following commands;Safety/judgement                 Orientation Level: Disoriented to;Place;Time;Situation   Memory: Decreased short-term memory Following Commands: Follows one step commands inconsistently Safety/Judgement: Decreased awareness of  safety;Decreased awareness of deficits     General Comments: Patient following simple commands inconsistently and responding with one word answers occasionally. Impaired executive functions.      General Comments General comments (skin integrity, edema, etc.): patient with increased pruritis    Exercises Other Exercises Other Exercises: Sitting balance activities including functional reaching   Assessment/Plan    PT Assessment Patient needs continued PT services  PT Problem List Decreased strength;Decreased mobility;Decreased activity tolerance;Decreased balance;Decreased cognition;Decreased range of motion;Decreased safety awareness       PT Treatment Interventions Therapeutic activities;Therapeutic exercise;Patient/family education;Balance training;Functional mobility training    PT Goals (Current goals can be found in the Care Plan section)  Acute Rehab PT Goals Patient Stated Goal: none stated    Frequency Min 2X/week   Barriers to discharge        Co-evaluation               AM-PAC PT "6 Clicks" Daily Activity  Outcome Measure Difficulty turning over in bed (including adjusting bedclothes, sheets and blankets)?: Unable Difficulty moving from lying on back to sitting on the side of the bed? : Unable Difficulty sitting down on and standing up from a chair with arms (e.g., wheelchair, bedside commode, etc,.)?: Unable Help needed moving to and from a bed to chair (including a wheelchair)?: Total Help needed walking in hospital room?: Total Help needed climbing 3-5 steps with a railing? : Total 6 Click Score: 6    End of Session Equipment Utilized During Treatment: Oxygen Activity Tolerance: Patient limited by fatigue Patient left: in bed;with call bell/phone within reach;with bed alarm set Nurse Communication: Mobility status PT Visit Diagnosis: Other abnormalities of gait and mobility (R26.89);Muscle weakness (generalized) (M62.81);Adult, failure to thrive  (R62.7)    Time: 8366-2947 PT Time Calculation (min) (ACUTE ONLY): 24 min   Charges:   PT Evaluation $PT Eval Moderate Complexity: 1 Mod PT Treatments $Therapeutic Activity: 8-22 mins   PT G Codes:        Ellamae Sia, PT, DPT Acute Rehabilitation Services  Pager: [place] 03/11/2018, 4:00 PM

## 2018-03-11 NOTE — Progress Notes (Addendum)
[facility] Progress Note   Dialysis Orders: tts gkc 4h   88.5kg  Profile 4 2/2 bath heparin none- except in catheter (due to hx SDH) lua avg / L IJ tdc Venofer 50, Mircera 200 last 5/23, calcitriol 3.25 left upper AVGG and left IJ TDC  Assessment/Plan: 1. 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 ESRD - TTS - HD tomorrow K 4.1 - last HD Wed but volume and K ok to postpone until Saturday- use AVGG Sat and hopefully we can then get [facility] removed.  3. Anemia - hgb 9.2 -  not due for ESA redose yet- hold Fe due to acute infection 4. Secondary hyperparathyroidism - corrected Ca 10.4 - on relatively high dose calcitriol as an outpt but hasn't received any here yet - resume at 2 mcg 5. HTN/volume - net UF 1.8 L 5/29 - below outpt edw if wts correct BP lowish  6. Nutrition - alb 1.9 - prostat ordered; pureed diet with supervision - ST guidelines 7. AMS - likely secondary to bacteremia 8. FTT - palliative care consulted which is very appropriate- overall prognosis poor for much improvement in overall QOL 9. Thrombocytopenia plts down to 19 K -for platelet transfusion before removing catheter if they remain down 10. DM - high risk for hypoglycemia due to infection/poor intakes 11. 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 DNR  Myriam Jacobson, PA-C [place] [phone] 03/11/2018,10:00 AM  LOS: 3 days   Pt seen, examined and agree w A/P as above.  Kelly Splinter MD [facility] pager [phone]   03/11/2018, 2:40 PM    Subjective:   Unable to answer questions today  Objective Vitals:   03/10/18 1415 03/10/18 2057 03/11/18 0632 03/11/18 0738  BP: 105/74 114/66 109/72 113/69  Pulse: 79 77 88 83  Resp: 18 18 18 16   Temp: 98.1 F (36.7 C) 97.7 F (36.5 C) 98.8  F (37.1 C) 98 F (36.7 C)  TempSrc:   Oral Oral  SpO2: 100% 100% 91% 99%  Weight:      Height:       Physical Exam General: sleeping, poorly arousable, not answering questions Heart: RRR Lungs: grossly clear, poor expansion Abdomen:soft NT  Extremities:tr LE edema, poor muscle tone, skin on feet peeling Dialysis Access: left IJ and left upper AVGG + bruit, slight LUE swelling   Additional Objective Labs: Basic Metabolic Panel: Recent Labs  Lab 03/09/18 0322 03/10/18 0437 03/11/18 0649  NA 135 138 137  K 4.3 3.8 4.1  CL 91* 92* 94*  CO2 27 28 29   GLUCOSE 157* 47* 89  BUN 72* 36* 51*  CREATININE 8.39* 5.36* 6.33*  CALCIUM 9.1 9.0 8.8*   Liver Function Tests: Recent Labs  Lab 03/08/18 1319 03/10/18 0437 03/11/18 0649  AST 31 33 32  ALT 18 17 11*  ALKPHOS 72 58 65  BILITOT 2.9* 3.1* 3.1*  PROT 5.5* 5.2* 4.7*  ALBUMIN 2.3* 1.9* 1.9*   No results for input(s): LIPASE, AMYLASE in the last 168 hours. CBC: Recent Labs  Lab 03/08/18 1319  03/09/18 0322 03/09/18 1843 03/10/18 0437 03/10/18 1617 03/11/18 0649  WBC 6.8  --  10.3 10.3 11.1* 10.0 10.1  NEUTROABS 6.1  --   --   --  8.0*  --   --  HGB 10.6*   < > 9.5* 9.6* 9.8* 9.2* 9.3*  HCT 30.7*   < > 27.1* 27.2* 27.8* 26.1* 26.1*  MCV 81.6  --  81.6 79.1 79.9 79.6 79.6  PLT 25*  --  14* 20* 13* 22* 19*   < > = values in this interval not displayed.   Blood Culture    Component Value Date/Time   SDES BLOOD RIGHT UPPER ARM 03/08/2018 1500   SPECREQUEST  03/08/2018 1500    BOTTLES DRAWN AEROBIC AND ANAEROBIC Blood Culture adequate volume   CULT  03/08/2018 1500    NO GROWTH 2 DAYS Performed at [facility], [place] [address], [place], [place] [address]    REPTSTATUS PENDING 03/08/2018 1500    Cardiac Enzymes: No results for input(s): CKTOTAL, CKMB, CKMBINDEX, TROPONINI in the last 168 hours. CBG: Recent Labs  Lab 03/10/18 0820 03/10/18 1145 03/10/18 1710 03/10/18 2054 03/11/18 0735  GLUCAP  102* 77 77 92 83   Iron Studies: No results for input(s): IRON, TIBC, TRANSFERRIN, FERRITIN in the last 72 hours. Lab Results  Component Value Date   INR 1.99 03/08/2018   INR 1.59 01/18/2018   INR 1.72 12/09/2017   Studies/Results: No results found. Medications: . sodium chloride    . sodium chloride    .  ceFAZolin (ANCEF) IV Stopped (03/10/18 2200)   . allopurinol  300 mg Oral Daily  . amiodarone  200 mg Oral Daily  . Chlorhexidine Gluconate Cloth  6 each Topical Q0600  . diclofenac sodium  2 g Topical TID  . feeding supplement (PRO-STAT SUGAR FREE 64)  30 mL Oral BID  . midodrine  5 mg Oral Q T,Th,Sa-HD  . oxybutynin  5 mg Oral BID  . pantoprazole  40 mg Oral QHS  . sevelamer carbonate  2,400 mg Oral TID WC  . sodium chloride flush  10-40 mL Intracatheter Q12H  . sodium chloride flush  3 mL Intravenous Q12H  . sodium chloride flush  3 mL Intravenous Q12H

## 2018-03-11 NOTE — Progress Notes (Signed)
*  PRELIMINARY RESULTS* Echocardiogram 2D Echocardiogram has been performed.  Leavy Cella 03/11/2018, 11:19 AM

## 2018-03-11 NOTE — Consult Note (Signed)
Consultation Note Date: 03/11/2018   Patient Name: Albert Stanley  DOB: Nov 17, 1945  MRN: [id number]  Age / Sex: 72 y.o., male  PCP: [name], MD Referring Physician: Modena Jansky, MD  Reason for Consultation: Establishing goals of care  HPI/Patient Profile: 72 y.o. male admitted on 03/08/2018 from [facility] with increased confusion. He has a past medical history significant for ESRD on TTS HD, chronic hypotension on midodrine, DM type II, NICM EF 60%, chronic systolic CHF, SDH 03/3015 status post craniotomy, right femoral DVT status post IVC filter, urinary retention, bladder cancer status post resection, renal mass, recent hospitalization 02/03/2018-02/07/2018 for refractory hypoglycemia, glipizide was switched to glyburide on discharge, presented to [facility] ED on 02/08/2018 with confusion and another episode of symptomatic hypoglycemia/CBG 39.  Ultimately all oral and subcu hypoglycemics were discontinued and the patient was discharged home on May 3.  Family states that they were notified by [facility] the patient was not acting right about 5 or 6 PM on day of admission.Marland Kitchen  He was mildly febrile they gave him a Tylenol and a Percocet and this morning he was still confused and they sent him on to hemodialysis.  At dialysis they were concerned about the patient's mental status noted that he had a low-grade temperature and sent him in for further evaluation.  In the emergency department he was febrile to 100.8 and his pulse was 102.  He was also thrombocytopenic with a white blood cell count of 6.8.  Physical examination revealed a foul-smelling catheter in the bladder with brown type of urine in the bag.  Wife states that the catheter has been present for approximately 18 months and is regularly changed but that the odor is persistent. Full skin evaluation was performed and there were no areas of ulcerations or  concern.  X-ray was obtained and it showed volumes and bibasilar opacities consistent with atelectasis but pneumonia could not be ruled out.  Patient was given cefepime and vancomycin. CT of the head was unremarkable.  EKG showed multifocal PVCs, a left chest dialysis catheter was noted on his chest x-ray and he had stable cardiomegaly with low lung volumes and the mentioned basilar opacities. Palliative Medicine team consulted for goals of care discussion.   Clinical Assessment and Goals of Care: I have reviewed medical records including lab results, imaging, Epic notes, and MAR, received report from the bedside RN, and assessed the patient. I then met at the bedside along with his wife, 2 daughters, sister, brother, and niece  to discuss diagnosis prognosis, GOC, EOL wishes, disposition and options.  Patient is somnolent and able to effectively for the patient in care discussion with family and I.  I introduced Palliative Medicine as specialized medical care for people living with serious illness. It focuses on providing relief from the symptoms and stress of a serious illness. The goal is to improve quality of life for both the patient and the family.  We discussed a brief life review of the patient.  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  He retired in 2002 due to health conditions.  He has been married to his wife for over 3 years and they have 2 daughters Threasa Beards and [place].  Family states he is loves football and is a man of [place].  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  As far as functional and nutritional status family states they have noticed a decline in health over the past 3 to 4 years.  They state he has battled with kidney disease for several years now however he started on dialysis in March 2017.  Since that time he has had repeated issues  with fistula graft placement for dialysis.  Wife states he is wheelchair-bound now as he is in need of a knee surgery.  He has seen orthopedic for his knee complications however with his other given patient he has been unable to undergo actual knee replacement.  Wife states earlier this year in February he slipped and fell in the bathroom and hit his head resulting in a hematoma that required surgical removal.  He then underwent rehabilitation and has had some residual weakness from this.  He has had several admissions throughout this year and has been placed in rehabilitation twice once at [facility] and most recently at [facility].  She states his appetite over the past 2 to 3 months have been fair.  He does require assistance with ADLs and prior to admission he was able to stand turn, and pivot with assistance.  Family does feel that his condition has declined more over the past 6 month and he is unable to assist as much as he did before when caring for himself.  Wife states he has had a chronic Foley catheter for the past year which is changed monthly by the urologist.  He also has had chronic back pain and has been on oxycodone for some time now through pain management.  We discussed his current illness and what it means in the larger context of his on-going co-morbidities.  Natural disease trajectory and expectations at EOL were discussed.  Wife and family verbalizes understanding of his current condition.  Wife was somewhat tearful during conversation stating she was told that at this point it may be best to discontinue dialysis due to his quality of life.  All the family verbalizes although they are aware that his quality of life has decreased some over the past couple of months they do not feel it is necessary to discontinue dialysis at this time.  I attempted to elicit values and goals of care important to the patient.    The difference between aggressive medical intervention and comfort care was  considered in light of the patient's goals of care.  We discussed patient's current health status and quality of life.  Again although family feels that his quality of life is not the best they would like to continue with dialysis with watchful waiting.  Daughters appear to be more realistic regarding the father's decline as his wife continues to state nephrology has not informed her that dialysis was no longer an option for him and she feels with appropriate care he may return to baseline.  I attempted to discuss the patient's quality of life and his current state may possibly be his new norm and may also continue to worsen as time continues.  Wife verbalizes she will be willing to cross that road at that time but right now she would like to continue treating the treatable with watchful waiting to see if he does return to baseline.  Her goal would be for him to be able to return home at some point.  Daughters did verbalize that they felt that his current condition he would not be safe to return home as the mother may not be able to provide the appropriate care needed.  Wife discussed options such as ALF compared to SNF.  Advanced directives, concepts specific to code status, artifical feeding and hydration, and rehospitalization were considered and discussed.  Family does agree for patient to continue to be DNR/DNI.  Again they wish for him to continue with dialysis until further explanation that this is not feasible for him, from nephrology.  Family is aware of current status and continues to state unless he was to die at this point they were not willing to withhold care such as dialysis.  Hospice and Palliative Care services outpatient were explained and offered.  Family was appreciative of conversation and explanation of both services.  At this time they are not interested in hospice services however they did verbalize their interest in outpatient palliative services at discharge.  Family also states they  have plans to look into pace services outpatient.  Questions and concerns were addressed. The family was encouraged to call with questions or concerns.  PMT will continue to support holistically.  Primary decision maker: HCPOA-wife, Kathrynn Ducking    SUMMARY OF RECOMMENDATIONS    DNR/DNI as documented and per family's request  Continue to treat the treatable while hospitalized.  Family is not prepared to discontinue dialysis at this time as they feel patient may return to baseline.  Family is prepared if patient does not return to baseline.  Family verbalizes they have not had a conversation with nephrology in regards to his dialysis treatment.  We discussed that they verbalized awareness and understanding that quality of life at this point is poor and will most likely not improve.  Patient will benefit for palliative outpatient services at discharge. Wife does not want consult placed for palliative at this time, states she would like to continue planning with CSW in regards to d/c.   Palliative medicine team will continue to support patient, patient's family, and medical team during hospitalization as needed.  Code Status/Advance Care Planning:  DNR/DNI  Palliative Prophylaxis:   Aspiration, Bowel Regimen, Delirium Protocol, Frequent Pain Assessment, Oral Care and Turn Reposition  Additional Recommendations (Limitations, Scope, Preferences):  Full Scope Treatment continue to treat the treatable at family's request.  Psycho-social/Spiritual:   Desire for further Chaplaincy support:NO  Prognosis:   Unable to determine-guarded to poor in the setting of chronic end-stage kidney disease on HD, deconditioning, poor p.o. intake, immobility, diabetes, chronic pain related to lumbar degenerative disc disease, malnutrition, generalized weakness, sepsis, hypertension, chronic Foley catheter, thrombocytopenia, and coagulability.  Discharge Planning: To Be Determined outpatient palliative  care services at minimum.     Primary Diagnoses: Present on Admission: . Sepsis due to undetermined organism with metabolic encephalopathy ([place]) . Diabetes mellitus with diabetic nephropathy without long-term current use of insulin ([place]) . Lumbar degenerative disc disease . Dyslipidemia associated with type 2 diabetes mellitus ([place]) . Anemia associated with chronic renal failure . Malnutrition of moderate degree . Benign essential HTN . Foley catheter problem ([place])   I have reviewed the medical record, interviewed the patient and family, and  examined the patient. The following aspects are pertinent.  Past Medical History:  Diagnosis Date  . Arthritis    Osteoarthritis  . Asthma   . Bladder cancer ([place]) dx'd 1990   surg only  . CHF (congestive heart failure) ([place])   . Diabetes mellitus without complication ([place])   . ESRD (end stage renal disease) ([name])    Tues, Thurs, Sat dialysis  . GERD (gastroesophageal reflux disease)   . History of cardiac catheterization    a. LHC 5/17: no obstructive CAD  . History of DVT (deep vein thrombosis)    2003 ish  . History of nuclear stress test    a. Myoview 4/17: EF 39%, inf, inf-lat, apical inf, apical lat, apical scar, intermediate risk  . Hyperlipidemia   . Hypertension   . NICM (nonischemic cardiomyopathy) ([place])    a. Echo 2/17: mod LVH, EF 35-40%, inf-lat and inf-sept HK, mod MR, severe LAE, small pericardial effusion   Social History   Socioeconomic History  . Marital status: Married    Spouse name: Not on file  . Number of children: Not on file  . Years of education: Not on file  . Highest education level: Not on file  Occupational History  . Occupation: Retired  Scientific laboratory technician  . Financial resource strain: Not on file  . Food insecurity:    Worry: Not on file    Inability: Not on file  . Transportation needs:    Medical: Not on file    Non-medical: Not on file  Tobacco Use  . Smoking status: Former Smoker    Years:  30.00    Last attempt to quit: 10/12/1998    Years since quitting: 19.4  . Smokeless tobacco: Never Used  Substance and Sexual Activity  . Alcohol use: No    Alcohol/week: 0.0 oz    Comment: Used to drink on the weekends, quit 2 years ago  . Drug use: No  . Sexual activity: Not Currently  Lifestyle  . Physical activity:    Days per week: Patient refused    Minutes per session: Patient refused  . Stress: Patient refused  Relationships  . Social connections:    Talks on phone: Patient refused    Gets together: Patient refused    Attends religious service: Patient refused    Active member of club or organization: Patient refused    Attends meetings of clubs or organizations: Patient refused    Relationship status: Patient refused  Other Topics Concern  . Not on file  Social History Narrative   Lives with wife and daughter.   Family History  Problem Relation Age of Onset  . Arthritis Mother   . Cancer Father   . Heart disease Father   . Hypertension Sister   . Alcohol abuse Brother   . Diabetes Daughter   . Heart attack Paternal Uncle    Scheduled Meds: . allopurinol  300 mg Oral Daily  . amiodarone  200 mg Oral Daily  . [START ON 2018/03/31] calcitRIOL  2 mcg Oral Q T,Th,Sa-HD  . Chlorhexidine Gluconate Cloth  6 each Topical Q0600  . [START ON 03/17/2018] darbepoetin (ARANESP) injection - DIALYSIS  200 mcg Intravenous Q Thu-HD  . diclofenac sodium  2 g Topical TID  . feeding supplement (PRO-STAT SUGAR FREE 64)  30 mL Oral BID  . midodrine  5 mg Oral Q T,Th,Sa-HD  . oxybutynin  5 mg Oral BID  . pantoprazole  40 mg Oral QHS  . phytonadione  5 mg Oral Daily  . sevelamer carbonate  2,400 mg Oral TID WC  . sodium chloride flush  10-40 mL Intracatheter Q12H  . sodium chloride flush  3 mL Intravenous Q12H  . sodium chloride flush  3 mL Intravenous Q12H   Continuous Infusions: . sodium chloride    . sodium chloride    . sodium chloride    .  ceFAZolin (ANCEF) IV Stopped  (03/10/18 2200)   PRN Meds:.sodium chloride, acetaminophen, Chlorhexidine Gluconate Cloth, ondansetron **OR** ondansetron (ZOFRAN) IV, sodium chloride flush, sodium chloride flush Medications Prior to Admission:  Prior to Admission medications   Medication Sig Start Date End Date Taking? Authorizing Provider  acetaminophen (TYLENOL) 500 MG tablet Take 500 mg by mouth every 6 (six) hours as needed for mild pain.   Yes [provider]  allopurinol (ZYLOPRIM) 300 MG tablet Take 300 mg by mouth daily.    Yes [provider]  Amino Acids-Protein Hydrolys (FEEDING SUPPLEMENT, PRO-STAT SUGAR FREE 64,) LIQD Take 30 mLs by mouth 2 (two) times daily.   Yes [provider]  amiodarone (PACERONE) 200 MG tablet Take 1 tablet (200 mg total) by mouth daily. 09/24/17  Yes Larey Dresser, MD  bisacodyl (DULCOLAX) 5 MG EC tablet Take 5 mg by mouth every other day. Hold for loose stools   Yes [provider]  diclofenac sodium (VOLTAREN) 1 % GEL Apply 2 g topically 3 (three) times daily. Apply to lower back, left hip and knee   Yes [provider]  diphenhydrAMINE (BENADRYL) 25 MG tablet Take 25 mg by mouth every 6 (six) hours as needed for allergies.   Yes [provider]  docusate sodium (COLACE) 100 MG capsule Take 100 mg by mouth 2 (two) times daily. Hold for loose stools.   Yes [provider]  Melatonin 3 MG TABS Take 6 mg by mouth at bedtime.    Yes [provider]  midodrine (PROAMATINE) 5 MG tablet Take 1 tablet (5 mg total) by mouth Every Tuesday,Thursday,and Saturday with dialysis. 02/12/18  Yes Hongalgi, Lenis Dickinson, MD  Multiple Vitamin (MULTIVITAMIN WITH MINERALS) TABS tablet Take 1 tablet by mouth daily.   Yes [provider]  Neo-Bacit-Poly-Lidocaine (PROCOMYCIN) 4 % CREA Apply 1 application topically daily as needed. apply to scalp   Yes [provider]  oxybutynin (DITROPAN) 5 MG tablet Take 5 mg by mouth 2 (two)  times daily.   Yes [provider]  oxyCODONE-acetaminophen (PERCOCET) 7.5-325 MG tablet Take 1 tablet by mouth every 6 (six) hours as needed for moderate pain. 02/11/18  Yes Hongalgi, Lenis Dickinson, MD  pantoprazole (PROTONIX) 40 MG tablet Take 40 mg by mouth at bedtime.   Yes [provider]  senna (SENOKOT) 8.6 MG TABS tablet Take 1 tablet by mouth at bedtime.   Yes [provider]  sevelamer carbonate (RENVELA) 800 MG tablet Take 3 tablets (2,400 mg total) by mouth 3 (three) times daily with meals. 02/11/18  Yes Hongalgi, Lenis Dickinson, MD  Zinc Oxide 13 % CREA Apply 1 application topically daily.   Yes [provider]  bacitracin ointment Apply 1 application topically 2 (two) times daily. Patient not taking: Reported on 03/08/2018 01/18/18   Antonietta Breach, PA-C  lactulose [facility]) 10 GM/15ML solution Take 10 g by mouth daily as needed for mild constipation.    [provider]  liver oil-zinc oxide (DESITIN) 40 % ointment Apply 1 application topically daily as needed for irritation.    [provider]  multivitamin (RENA-VIT) TABS tablet Take 1 tablet by mouth at bedtime.  12/20/15   [provider]  neomycin-bacitracin-polymyxin (NEOSPORIN) [phone] ointment Apply 1 application topically daily as needed (area on scalp).    [provider]   Allergies  Allergen Reactions  . Meperidine Shortness Of Breath and Other (See Comments)    Increased heart rate; Reaction to demerol  . Penicillins Other (See Comments)    Tolerates cefazolin  Has patient had a PCN reaction causing immediate rash, facial/tongue/throat swelling, SOB or lightheadedness with hypotension: Yes (syncope) Has patient had a PCN reaction causing severe rash involving mucus membranes or skin necrosis: No Has patient had a PCN reaction that required hospitalization: No (treated at MD's office) Has patient had a PCN reaction occurring within the last 10 years: No If all of  the above answers are "NO", then may proceed with Cephalosporin use.  . Sulfa Antibiotics Shortness Of Breath and Other (See Comments)    Increased heart rate  . Sulfonamide Derivatives Shortness Of Breath and Other (See Comments)    TACHYCARDIA  . Lac [place] Diarrhea, Nausea And Vomiting and Other (See Comments)    Constipation Reaction to whole milk  . Lactose Intolerance (Gi) Diarrhea, Nausea And Vomiting and Other (See Comments)    Constipation Reaction to whole milk  . Milk-Related Compounds Diarrhea, Nausea And Vomiting and Other (See Comments)    Constipation Reaction to whole milk  . Foltx [Elfolate Plus] Other (See Comments)    UNSPECIFIED REACTION  "Pt does not recall reaction to this"  . Food Other (See Comments)    POTATOES  Unknown reaction  . Orange Juice [Orange Oil] Other (See Comments)    Unknown reaction  . Tomato Other (See Comments)    Unknown reaction   . Betadine [Povidone Iodine] Itching, Rash and Other (See Comments)    burning  . Eggs Or Egg-Derived Products Nausea And Vomiting    Reaction to egg yolks - not whites     Review of Systems  Unable to perform ROS: Mental status change    Physical Exam  Constitutional: He has a sickly appearance.  Cardiovascular: Regular rhythm, normal heart sounds, intact distal pulses and normal pulses.  Some bilateral edema   Pulmonary/Chest: Effort normal. He has decreased breath sounds.  Dialysis cath  Abdominal: Soft. Normal appearance and bowel sounds are normal.  Musculoskeletal:  Generalized weakness  Neurological: He is disoriented.  Psychiatric: Cognition and memory are impaired. He expresses inappropriate judgment.  Nursing note and vitals reviewed.   Vital Signs: BP 113/71 (BP Location: Right Arm)   Pulse 78   Temp 97.7 F (36.5 C) (Oral)   Resp 14   Ht 5' 7" (1.702 m)   Wt 84 kg (185 lb 3 oz)   SpO2 98%   BMI 29.00 kg/m  Pain Scale: 0-10   Pain Score: 0-No pain   SpO2: SpO2: 98 % O2  Device:SpO2: 98 % O2 Flow Rate: .O2 Flow Rate (L/min): 2 L/min  IO: Intake/output summary:   Intake/Output Summary (Last 24 hours) at 03/11/2018 1737 Last data filed at 03/11/2018 1410 Gross per 24 hour  Intake 576 ml  Output -  Net 576 ml    LBM: Last BM Date: 03/11/18 Baseline Weight: Weight: 89 kg (196 lb 3.4 oz) Most recent weight: Weight: 84 kg (185 lb 3 oz)     Palliative Assessment/Data:PPS 30%   Time In: 1530 Time Out: 1645 Time Total: 75 min.  Greater  than 50%  of this time was spent counseling and coordinating care related to the above assessment and plan.  Signed by: Alda Lea, NP-BC Palliative Medicine Team  Phone: [phone] Fax: [phone]   Please contact Palliative Medicine Team phone at [phone] for questions and concerns.  For individual provider: See Shea Evans

## 2018-03-11 NOTE — Evaluation (Signed)
Clinical/Bedside Swallow Evaluation Patient Details  Name: Albert Stanley MRN: [id number] Date of Birth: 06-Feb-1946  Today's Date: 03/11/2018 Time: SLP Start Time (ACUTE ONLY): 0903 SLP Stop Time (ACUTE ONLY): 0934 SLP Time Calculation (min) (ACUTE ONLY): 31 min  Past Medical History:  Past Medical History:  Diagnosis Date  . Arthritis    Osteoarthritis  . Asthma   . Bladder cancer ([place]) dx'd 1990   surg only  . CHF (congestive heart failure) ([place])   . Diabetes mellitus without complication ([place])   . ESRD (end stage renal disease) ([name])    Tues, Thurs, Sat dialysis  . GERD (gastroesophageal reflux disease)   . History of cardiac catheterization    a. LHC 5/17: no obstructive CAD  . History of DVT (deep vein thrombosis)    2003 ish  . History of nuclear stress test    a. Myoview 4/17: EF 39%, inf, inf-lat, apical inf, apical lat, apical scar, intermediate risk  . Hyperlipidemia   . Hypertension   . NICM (nonischemic cardiomyopathy) ([place])    a. Echo 2/17: mod LVH, EF 35-40%, inf-lat and inf-sept HK, mod MR, severe LAE, small pericardial effusion   Past Surgical History:  Past Surgical History:  Procedure Laterality Date  . arthroscopic knee surgery Left   . AV FISTULA PLACEMENT Left 12/13/2015   Procedure: ARTERIOVENOUS (AV) FISTULA CREATION;  Surgeon: Angelia Mould, MD;  Location: [facility] OR;  Service: Vascular;  Laterality: Left;  . AV FISTULA PLACEMENT Left 12/15/2017   Procedure: INSERTION OF ARTERIOVENOUS (AV) GORE-TEX GRAFT ARM;  Surgeon: Serafina Mitchell, MD;  Location: [place];  Service: Vascular;  Laterality: Left;  . BACK SURGERY     2 times  1960  . CARDIAC CATHETERIZATION N/A 02/28/2016   Procedure: Left Heart Cath and Coronary Angiography;  Surgeon: Larey Dresser, MD;  Location: [place] [facility];  Service: Cardiovascular;  Laterality: N/A;  . CRANIOTOMY N/A 12/05/2017   Procedure: CRANIOTOMY HEMATOMA EVACUATION SUBDURAL;  Surgeon: Ashok Pall, MD;  Location:  [place];  Service: Neurosurgery;  Laterality: N/A;  . CYSTOSCOPY    . FISTULA SUPERFICIALIZATION Left 08/14/2016   Procedure: FISTULA SUPERFICIALIZATION LEFT UPPER ARM;  Surgeon: Angelia Mould, MD;  Location: [place];  Service: Vascular;  Laterality: Left;  . INSERTION OF DIALYSIS CATHETER N/A 12/13/2015   Procedure: INSERTION OF DIALYSIS CATHETER;  Surgeon: Angelia Mould, MD;  Location: [place];  Service: Vascular;  Laterality: N/A;  . INSERTION OF DIALYSIS CATHETER Left 05/07/2017   Procedure: INSERTION OF DIALYSIS CATHETER LEFT INTERNAL JUGULAR PLACEMENT;  Surgeon: Angelia Mould, MD;  Location: [place];  Service: Vascular;  Laterality: Left;  . INSERTION OF DIALYSIS CATHETER Left 12/15/2017   Procedure: INSERTION OF DIALYSIS CATHETER;  Surgeon: Serafina Mitchell, MD;  Location: [facility] OR;  Service: Vascular;  Laterality: Left;  . IR IVC FILTER PLMT / S&I Burke Keels GUID/MOD SED  12/18/2017  . IR RADIOLOGIST EVAL & MGMT  05/12/2017  . JOINT REPLACEMENT Left    hip  . LIGATION OF COMPETING BRANCHES OF ARTERIOVENOUS FISTULA Left 08/14/2016   Procedure: LIGATION OF COMPETING BRANCHES OF ARTERIOVENOUS FISTULA LEFT UPPER ARM;  Surgeon: Angelia Mould, MD;  Location: [place];  Service: Vascular;  Laterality: Left;  . PERIPHERAL VASCULAR CATHETERIZATION Left 06/09/2016   Procedure: Fistulagram;  Surgeon: Serafina Mitchell, MD;  Location: [place] [facility];  Service: Cardiovascular;  Laterality: Left;  ARM  . PERIPHERAL VASCULAR CATHETERIZATION Left 06/09/2016   Procedure:  Peripheral Vascular Balloon Angioplasty;  Surgeon: Serafina Mitchell, MD;  Location: [place] [facility];  Service: Cardiovascular;  Laterality: Left;  upper arm venous  . PERIPHERAL VASCULAR CATHETERIZATION Left 06/25/2016   Procedure: Fistulagram;  Surgeon: Serafina Mitchell, MD;  Location: [place] [facility];  Service: Cardiovascular;  Laterality: Left;  . REVISON OF ARTERIOVENOUS FISTULA Left 05/07/2017   Procedure: REPAIR OF  PSEUDOANEURYSM  LEFT BRACHIO-CEPHALIC  ARM ARTERIOVENOUS FISTULA;  Surgeon: Angelia Mould, MD;  Location: [facility] OR;  Service: Vascular;  Laterality: Left;   HPI:  72 year old male admitted with AMS, fever, concern for sepsis. CT Chest showed bibasliar opacities most likely representing atelectasis although subtle bibasilar PNA coudln't be excluded. PMH: DM, ESRD, GERD, HLD, HTN   Assessment / Plan / Recommendation Clinical Impression  ST follow up to re assess patient's swallow and readiness for PO intake.  He was given ice chips, thin liquids via spoon, cup and straw and pureed material.   Oral mechanism exam was attempted and the patient was unable to follow commands.  He is known to [facility] service from previous admission.  He was initially placed on a dysphagia 1 diet but was eventually advanced to regular solids.  He remains confused but alert today.  No verbal output observed.  He required tactile and verbal cues to facilitate intake.  Mild oral holding was noted but swallow trigger appeared to be swift and hyo-laryngeal excursion was appreciated to palpation.  No overt s/s of aspiration were seen. Patient was noted to belch suggesting esophageal issues.  Recommend begin a pureed diet with thin liquids.  Patient should only be fed when alert.  He will require total assistance for intake and should be fed slowly.  His biggest risk factor for aspiration is his mentation.  ST will follow up for therapeutic diet tolerance and possible diet advancement as mentation clears.   Need for continued ST after DC to be determined.   SLP Visit Diagnosis: Dysphagia, unspecified (R13.10)    Aspiration Risk  Mild aspiration risk    Diet Recommendation   Dysphagia 1 (Puree) with thin liquids  Medication Administration: Whole meds with puree    Other  Recommendations Oral Care Recommendations: Oral care before and after PO;Oral care BID   Follow up Recommendations Other (comment)(TBD)      Frequency and  Duration min 2x/week  2 weeks       Prognosis Prognosis for Safe Diet Advancement: Fair Barriers to Reach Goals: Cognitive deficits      Swallow Study   General Date of Onset: 03/08/18 HPI: 72 year old male admitted with AMS, fever, concern for sepsis. CT Chest showed bibasliar opacities most likely representing atelectasis although subtle bibasilar PNA coudln't be excluded. PMH: DM, ESRD, GERD, HLD, HTN Type of Study: Bedside Swallow Evaluation Previous Swallow Assessment: BSE in February and March 2019, advanced to reg/thin Diet Prior to this Study: NPO Temperature Spikes Noted: No Respiratory Status: Nasal cannula History of Recent Intubation: No Behavior/Cognition: Confused;Lethargic/Drowsy;Doesn't follow directions;Requires cueing Oral Cavity Assessment: Dry;Dried secretions(Patient with dried blood that was removed.) Oral Care Completed by SLP: Yes Oral Cavity - Dentition: Poor condition;Missing dentition Vision: Impaired for self-feeding Self-Feeding Abilities: Total assist Patient Positioning: Upright in bed Baseline Vocal Quality: Not observed Volitional Cough: Cognitively unable to elicit Volitional Swallow: Unable to elicit    Oral/Motor/Sensory Function Overall Oral Motor/Sensory Function: (Unable to assess)   Ice Chips Ice chips: Impaired Presentation: Spoon Oral Phase Impairments: (Pt spit ice  chip from oral cavity.) Oral Phase Functional Implications: (NA)   Thin Liquid Thin Liquid: Impaired Presentation: Straw;Spoon;Cup Oral Phase Impairments: Reduced labial seal Oral Phase Functional Implications: Right anterior spillage;Left anterior spillage    Nectar Thick Nectar Thick Liquid: Not tested   Honey Thick Honey Thick Liquid: Not tested   Puree Puree: Impaired Presentation: Spoon Oral Phase Impairments: Impaired mastication Oral Phase Functional Implications: Prolonged oral transit   Solid   GO   Solid: Not tested        Shelly Flatten, MA,  CCC-SLP Acute Rehab SLP [phone]  Lamar Sprinkles 03/11/2018,9:50 AM

## 2018-03-11 NOTE — Care Management Note (Addendum)
Case Management Note  Patient Details  Name: Albert Stanley MRN: [id number] Date of Birth: 1946-04-15  Subjective/Objective: Admitted for Group B strep bacteremia.                Action/Plan: Prior to admission patient resided at [facility].  Discharge disposition is to return to [facility]. Prior to admission patient active with [facility] for PT/OT/RN will need orders with face-to-face prior to discharge.  [facility] discharge Coordinator following patient admission.  NCM will continue to follow for discharge transition needs.  Expected Discharge Date:   To be determined               Expected Discharge Plan:  [place]  In-House Referral:  Clinical Social Work(Speech Pathology)  Discharge planning Services  CM Consult  Post Acute Care Choice:  Resumption of Svcs/PTA Provider Choice offered to:   Patient  DME Arranged:    DME Agency:     HH Arranged:    [facility] Agency:     Status of Service:  In process, will continue to follow  Kristen Cardinal, RN 03/11/2018, 12:10 PM

## 2018-03-12 DIAGNOSIS — T82590A Other mechanical complication of surgically created arteriovenous fistula, initial encounter: Secondary | ICD-10-CM

## 2018-03-12 LAB — CBC
HCT: 25.4 % — ABNORMAL LOW (ref 39.0–52.0)
Hemoglobin: 9 g/dL — ABNORMAL LOW (ref 13.0–17.0)
MCH: 27.4 pg (ref 26.0–34.0)
MCHC: 35.4 g/dL (ref 30.0–36.0)
MCV: 77.4 fL — AB (ref 78.0–100.0)
PLATELETS: 65 10*3/uL — AB (ref 150–400)
RBC: 3.28 MIL/uL — ABNORMAL LOW (ref 4.22–5.81)
RDW: 18.5 % — ABNORMAL HIGH (ref 11.5–15.5)
WBC: 9.3 10*3/uL (ref 4.0–10.5)

## 2018-03-12 LAB — RENAL FUNCTION PANEL
Albumin: 1.9 g/dL — ABNORMAL LOW (ref 3.5–5.0)
Anion gap: 14 (ref 5–15)
BUN: 58 mg/dL — ABNORMAL HIGH (ref 6–20)
CALCIUM: 8.4 mg/dL — AB (ref 8.9–10.3)
CHLORIDE: 95 mmol/L — AB (ref 101–111)
CO2: 28 mmol/L (ref 22–32)
CREATININE: 6.94 mg/dL — AB (ref 0.61–1.24)
GFR calc non Af Amer: 7 mL/min — ABNORMAL LOW (ref >60)
GFR, EST AFRICAN AMERICAN: 8 mL/min — AB (ref >60)
GLUCOSE: 84 mg/dL (ref 65–99)
Phosphorus: 6 mg/dL — ABNORMAL HIGH (ref 2.5–4.6)
Potassium: 4.2 mmol/L (ref 3.5–5.1)
SODIUM: 137 mmol/L (ref 135–145)

## 2018-03-12 LAB — PREPARE PLATELET PHERESIS
Unit division: 0
Unit division: 0

## 2018-03-12 LAB — BPAM PLATELET PHERESIS
Blood Product Expiration Date: 201906012359
Blood Product Expiration Date: 201906022359
ISSUE DATE / TIME: 201905311406
ISSUE DATE / TIME: 201905311236
Unit Type and Rh: 6200
Unit Type and Rh: 6200

## 2018-03-12 LAB — GLUCOSE, CAPILLARY
GLUCOSE-CAPILLARY: 91 mg/dL (ref 65–99)
GLUCOSE-CAPILLARY: 104 mg/dL — AB (ref 65–99)
Glucose-Capillary: 59 mg/dL — ABNORMAL LOW (ref 65–99)
Glucose-Capillary: 54 mg/dL — ABNORMAL LOW (ref 65–99)
Glucose-Capillary: 76 mg/dL (ref 65–99)
Glucose-Capillary: 78 mg/dL (ref 65–99)

## 2018-03-12 LAB — HEPATITIS C ANTIBODY: HCV Ab: <0.1 s/co ratio (ref 0.0–0.9)

## 2018-03-12 MED ORDER — LIDOCAINE-PRILOCAINE 2.5-2.5 % EX CREA: 1.0000 "application " | TOPICAL_CREAM | CUTANEOUS | Status: DC | PRN

## 2018-03-12 MED ORDER — ALLOPURINOL 100 MG PO TABS
100.0000 mg | ORAL_TABLET | Freq: Every day | ORAL | Status: DC
  Administered 2018-03-13 – 2018-03-14 (×3): 100 mg via ORAL
  Filled 2018-03-12 (×3): qty 1

## 2018-03-12 MED ORDER — ALTEPLASE 2 MG IJ SOLR: 2.0000 mg | Freq: Once | INTRAMUSCULAR | Status: DC | PRN

## 2018-03-12 MED ORDER — PENTAFLUOROPROP-TETRAFLUOROETH EX AERO: 1.0000 "application " | INHALATION_SPRAY | CUTANEOUS | Status: DC | PRN

## 2018-03-12 MED ORDER — HEPARIN SODIUM (PORCINE) 1000 UNIT/ML DIALYSIS: 1000.0000 [IU] | INTRAMUSCULAR | Status: DC | PRN

## 2018-03-12 MED ORDER — SODIUM CHLORIDE 0.9 % IV SOLN: 100.0000 mL | INTRAVENOUS | Status: DC | PRN

## 2018-03-12 MED ORDER — LIDOCAINE HCL (PF) 1 % IJ SOLN: 5.0000 mL | INTRAMUSCULAR | Status: DC | PRN

## 2018-03-12 MED ORDER — DEXTROSE 50 % IV SOLN
INTRAVENOUS | Status: AC
  Administered 2018-03-12: 25 mL
  Filled 2018-03-12: qty 50

## 2018-03-13 ENCOUNTER — Encounter (HOSPITAL_COMMUNITY): Payer: Self-pay | Admitting: *Deleted

## 2018-03-13 LAB — CBC
HEMATOCRIT: 27.3 % — AB (ref 39.0–52.0)
Hemoglobin: 9.5 g/dL — ABNORMAL LOW (ref 13.0–17.0)
MCH: 27.6 pg (ref 26.0–34.0)
MCHC: 34.8 g/dL (ref 30.0–36.0)
MCV: 79.4 fL (ref 78.0–100.0)
PLATELETS: 46 10*3/uL — AB (ref 150–400)
RBC: 3.44 MIL/uL — ABNORMAL LOW (ref 4.22–5.81)
RDW: 19.2 % — AB (ref 11.5–15.5)
WBC: 7.3 10*3/uL (ref 4.0–10.5)

## 2018-03-13 LAB — TYPE AND SCREEN
ABO/RH(D): O POS
Antibody Screen: POSITIVE
DAT, IGG: NEGATIVE
Unit division: 0

## 2018-03-13 LAB — BPAM RBC
BLOOD PRODUCT EXPIRATION DATE: 201906112359
ISSUE DATE / TIME: 201905131937
Unit Type and Rh: 9500

## 2018-03-13 LAB — GLUCOSE, CAPILLARY
GLUCOSE-CAPILLARY: 151 mg/dL — AB (ref 65–99)
Glucose-Capillary: 112 mg/dL — ABNORMAL HIGH (ref 65–99)
Glucose-Capillary: 127 mg/dL — ABNORMAL HIGH (ref 65–99)
Glucose-Capillary: 132 mg/dL — ABNORMAL HIGH (ref 65–99)
Glucose-Capillary: 210 mg/dL — ABNORMAL HIGH (ref 65–99)
Glucose-Capillary: 211 mg/dL — ABNORMAL HIGH (ref 65–99)

## 2018-03-13 LAB — PROTIME-INR
INR: 1.76
Prothrombin Time: 20.3 seconds — ABNORMAL HIGH (ref 11.4–15.2)

## 2018-03-13 LAB — CULTURE, BLOOD (ROUTINE X 2)
Culture: NO GROWTH
SPECIAL REQUESTS: ADEQUATE

## 2018-03-13 MED ORDER — SODIUM CHLORIDE 0.9 % IV SOLN
300.0000 mg | Freq: Two times a day (BID) | INTRAVENOUS | Status: DC
  Administered 2018-03-13 – 2018-03-14 (×3): 300 mg via INTRAVENOUS
  Filled 2018-03-13 (×5): qty 300

## 2018-03-13 MED ORDER — HEPARIN SODIUM (PORCINE) 1000 UNIT/ML IJ SOLN: 1000.0000 [IU] | Freq: Once | INTRAMUSCULAR | Status: DC

## 2018-03-13 NOTE — Progress Notes (Addendum)
PROGRESS NOTE   Albert Stanley  IRC:[id number]    DOB: June 03, 1946    DOA: 03/08/2018  PCP: [name], MD   I have briefly reviewed patients previous medical records in [facility].  Brief Narrative:  72 year old male, current SNF resident, with PMH of ESRD on TTS HD, chronic hypotension on midodrine, type II DM with previous hypoglycemia on oral hypoglycemics, NICM EF 51%, chronic systolic CHF, SDH 0/2585 status post craniotomy, right femoral DVT status post IVC filter, urinary retention with indwelling/bladder cancer status post resection, renal mass Foley catheter (Dr. [name]), frequent hospitalizations (this is his sixth in the last 6 months), recently in 4/30-5/3 for hypoglycemia presented from HD center due to confusion and not acting himself, low-grade fever and admitted for sepsis due to group B strep bacteremia.  Nephrology and hematology consulted.  Likely has abscess at the site of the left upper extremity AVG, nephrology requesting vascular surgery input.  Nephrology planning to discuss with spouse/family regarding overall poor prognosis and consideration for transitioning to comfort oriented care.   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  On admission, patient had indwelling Foley catheter (changed this admission on 5/30 by urology), [facility] and left upper extremity AVG.  Initially HD access did not appear infected but on 6/31, clinical exam suspicious for abscess at AVG site and likely source of his bacteremia.  He had been empirically started on IV vancomycin and cefepime.  Based on culture results changed to IV cefazolin on 5/30.  TTE 5/30 does not comment about vegetations.  Nephrology plans to discuss with spouse/family regarding overall poor prognosis and consideration for transitioning to comfort oriented care.  If family insist on continued aggressive care, vascular surgery consult for assessment and management of left upper extremity AVG and removal of TDC.  If continued aggressive care, ID will need to recommend duration of antibiotics given that source is now felt to be left AVG abscess rather than [facility] which could be too.  Surveillance blood cultures x2 from 5/31: Negative to date.  Acute toxic metabolic encephalopathy: Likely secondary to acute infection.  CT head without acute findings.  No focal deficits.  Mental status waxing and waning.  On 6/2, more alert and oriented to self and partly to place.  Thrombocytopenia: On chart review, patient has had chronic intermittent mild thrombocytopenia in the past and coagulopathy.  He presented with platelets of 25K which is dropped to 14, INR 1.9, schistocytes noted on CBC 5/28.  I have consulted Dr. Irene Limbo, Hematology discussed with him in detail on 5/30.  He suspects sepsis related DIC and antibiotics as the cause of patient's thrombocytopenia and less likely from TTP.  Normal fibrinogen, mildly elevated LDH.  If platelets <20 or if bleeding,  recommends transfusing 1 bag of platelets.  If bleeding, may need to consider FFP and if bleeding and low fibrinogen may also need to consider cryoprecipitate.  Thus far has received 3 units of platelets and platelets  are up to 65K on 6/1.  Also on vitamin K for coagulopathy.  Platelets down to 46 on 6/2.  Follow CBC in a.m.  ESRD on TTS HD: Nephrology consulted and underwent HD 5/29 and 6/1.  I discussed with Dr. Melvia Heaps again today, indicates poor overall prognosis and will try to meet with family to address further goals of care.  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 [name], Urology).  Consulted urology who changed Foley catheter on 5/30.  Continue monthly outpatient change by urology.  History of recurrent DVT: Status post IVC filter.  NICM/chronic systolic CHF: Chronic volume overload, managed across dialysis but no overt pulmonary edema.  Volume status better than on admission.  TTE 5/31: LVEF 25% and diffuse hypokinesis.  PVCs: Remains on amiodarone.  Chronic hypotension: Midodrine.  Blood pressures have been normal over last couple of days.  Type II DM with recurrent hypoglycemia: Has had intermittent hypoglycemia despite no hypoglycemics.  Treat per hypoglycemia protocol.  Encourage oral intake.  Dysphagia: Speech therapy input appreciated and have transitioned to dysphagia 1 diet and thin liquids on 5/31.  Anemia in ESRD: Stable.  History of SDH status post craniotomy: Repeat CT head without acute findings.  Adult FTT: I discussed in detail with patient's spouse on 5/30 and outlined overall poor long-term prognosis given advanced age, multiple significant severe comorbidities, frequent hospitalizations.  Palliative care input appreciated, changed to DNR, family wished to continue to treat the treatable while hospitalized and were not ready to stop HD yet but have not discussed with nephrology.  Nephrology plans to discuss with family today.  Isolated hyperbilirubinemia:?  Related to sepsis related DIC.  LFT shows mixed direct and indirect hyperbilirubinemia.  NSVT:  Noted on 5/31.  Likely due to cardiomyopathy.  Potassium and magnesium okay.  None since.  New onset A. fib: On 6/2, telemetry showed new onset A. fib approximately 9:40 AM with rates in the 100s-110s.  Difficult situation.  Not anticoagulation candidate due to severe thrombocytopenia and coagulopathy.  Current rates are not symptomatic.  Hold rate control medications due to risk of worsening hypotension.   DVT prophylaxis: SCDs Code Status: DNR Family Communication: None at bedside today. Disposition: To be determined   Consultants:  Neurology Hematology Palliative care team for goals of care. Infectious disease. Vascular surgery  Procedures:  Hemodialysis  Antimicrobials:  IV vancomycin and cefepime, discontinued. IV cefazolin 5/30 >   Subjective: Mental status better today.  Alert and oriented to self and partly to place.  Reports pain in left upper arm.  Hypoglycemia 54 on 6/1 afternoon.  As per nursing, no other acute issues.  ROS: Unable.  Objective:  Vitals:   2018/04/04 1711 04-04-18 2043 03/13/18 0325 03/13/18 0958  BP: 120/61 120/65 117/69 107/74  Pulse: 90 86 88 (!) 133  Resp: 20 17 18 14   Temp: 98 F (36.7 C) 98.3 F (36.8 C) 98.8 F (37.1 C) 98.7 F (37.1 C)  TempSrc: Oral Oral Oral Oral  SpO2: 100% 91% (!) 86% 90%  Weight:  83 kg (182 lb 15.7 oz)    Height:        Examination:  General exam: Elderly male, moderately built and nourished,  chronically ill looking, lying in bed, looks better than he did yesterday. Respiratory system: Clear to auscultation anteriorly.  Occasional basal crackles.  No increased work of breathing.  Left TDC without acute findings. Cardiovascular system: S1 & S2 heard, RRR. No JVD, murmurs, rubs, gallops or clicks.  1+ bilateral leg edema, left upper extremity 2+ pitting edema.  Telemetry personally reviewed: Sinus rhythm changed to A. fib with ventricular rate in the 100s-110s on 6/1 at approximately 9:40 AM. Gastrointestinal  system: Abdomen is nondistended, soft and nontender. No organomegaly or masses felt. Normal bowel sounds heard.  Stable. Central nervous system: Mental status as above, better than yesterday.  No focal neurological deficits.  Follows simple instructions. Extremities: Moves all extremities symmetrically.  Left upper extremity AVG now shows new swelling, erythema (less than yesterday) and increased warmth in the lower aspect.  Has mittens on both hands.   Skin: No rashes, lesions or ulcers Psychiatry: Judgement and insight impaired. Mood & affect cannot be assessed.    Data Reviewed: I have personally reviewed following labs and imaging studies  CBC: Recent Labs  Lab 03/08/18 1319  03/10/18 0437 03/10/18 1617 03/11/18 0649 03-25-18 0838 03/13/18 0418  WBC 6.8   < > 11.1* 10.0 10.1 9.3 7.3  NEUTROABS 6.1  --  8.0*  --   --   --   --   HGB 10.6*   < > 9.8* 9.2* 9.3* 9.0* 9.5*  HCT 30.7*   < > 27.8* 26.1* 26.1* 25.4* 27.3*  MCV 81.6   < > 79.9 79.6 79.6 77.4* 79.4  PLT 25*   < > 13* 22* 19* 65* 46*   < > = values in this interval not displayed.   Basic Metabolic Panel: Recent Labs  Lab 03/08/18 1319 03/08/18 1339 03/09/18 0322 03/10/18 0437 03/11/18 0649 03/25/2018 0838  NA 134* 132* 135 138 137 137  K 4.4 4.3 4.3 3.8 4.1 4.2  CL 92* 95* 91* 92* 94* 95*  CO2 24  --  27 28 29 28   GLUCOSE 140* 136* 157* 47* 89 84  BUN 63* 54* 72* 36* 51* 58*  CREATININE 8.01* 7.90* 8.39* 5.36* 6.33* 6.94*  CALCIUM 9.3  --  9.1 9.0 8.8* 8.4*  MG  --   --  2.1  --   --   --   PHOS  --   --   --   --   --  6.0*   Liver Function Tests: Recent Labs  Lab 03/08/18 1319 03/10/18 0437 03/11/18 0649 Mar 25, 2018 0838  AST 31 33 32  --   ALT 18 17 11*  --   ALKPHOS 72 58 65  --   BILITOT 2.9* 3.1* 3.1*  --   PROT 5.5* 5.2* 4.7*  --   ALBUMIN 2.3* 1.9* 1.9* 1.9*   Coagulation Profile: Recent Labs  Lab 03/08/18 1655 03/13/18 0418  INR 1.99 1.76   Cardiac Enzymes: No results for input(s):  CKTOTAL, CKMB, CKMBINDEX, TROPONINI in the last 168 hours. HbA1C: No results for input(s): HGBA1C in the last 72 hours. CBG: Recent Labs  Lab Mar 25, 2018 1533 03-25-18 1709 03/25/2018 2043 03/13/18 0323 03/13/18 0742  GLUCAP 76 78 104* 132* 127*    Recent Results (from the past 240 hour(s))  Culture, blood (routine x 2)     Status: Abnormal   Collection Time: 03/08/18  2:30 PM  Result Value Ref Range Status   Specimen Description BLOOD RIGHT HAND  Final   Special Requests   Final  BOTTLES DRAWN AEROBIC AND ANAEROBIC Blood Culture results may not be optimal due to an inadequate volume of blood received in culture bottles Performed at [facility], [address], [place], [place] [address]    Culture  Setup Time   Final    IN BOTH AEROBIC AND ANAEROBIC BOTTLES GRAM POSITIVE COCCI Organism ID to follow CRITICAL RESULT CALLED TO, READ BACK BY AND VERIFIED WITH: L SEAY PHARMD 03/09/18 0239 JDW    Culture GROUP B STREP(S.AGALACTIAE)ISOLATED (A)  Final   Report Status 03/10/2018 FINAL  Final   Organism ID, Bacteria GROUP B STREP(S.AGALACTIAE)ISOLATED  Final      Susceptibility   Group b strep(s.agalactiae)isolated - MIC*    CLINDAMYCIN >=1 RESISTANT Resistant     AMPICILLIN <=0.25 SENSITIVE Sensitive     ERYTHROMYCIN >=8 RESISTANT Resistant     VANCOMYCIN 0.5 SENSITIVE Sensitive     CEFTRIAXONE <=0.12 SENSITIVE Sensitive     LEVOFLOXACIN 1 SENSITIVE Sensitive     * GROUP B STREP(S.AGALACTIAE)ISOLATED  Blood Culture ID Panel (Reflexed)     Status: Abnormal   Collection Time: 03/08/18  2:30 PM  Result Value Ref Range Status   Enterococcus species NOT DETECTED NOT DETECTED Final   Listeria monocytogenes NOT DETECTED NOT DETECTED Final   Staphylococcus species NOT DETECTED NOT DETECTED Final   Staphylococcus aureus NOT DETECTED NOT DETECTED Final   Streptococcus species DETECTED (A) NOT DETECTED Final    Comment: CRITICAL RESULT CALLED TO, READ BACK BY AND VERIFIED WITH: L  SEAY PHARMD 03/09/18 0239 JDW    Streptococcus agalactiae DETECTED (A) NOT DETECTED Final    Comment: CRITICAL RESULT CALLED TO, READ BACK BY AND VERIFIED WITH: L SEAY PHARMD 03/09/18 0239 JDW    Streptococcus pneumoniae NOT DETECTED NOT DETECTED Final   Streptococcus pyogenes NOT DETECTED NOT DETECTED Final   Acinetobacter baumannii NOT DETECTED NOT DETECTED Final   Enterobacteriaceae species NOT DETECTED NOT DETECTED Final   Enterobacter cloacae complex NOT DETECTED NOT DETECTED Final   Escherichia coli NOT DETECTED NOT DETECTED Final   Klebsiella oxytoca NOT DETECTED NOT DETECTED Final   Klebsiella pneumoniae NOT DETECTED NOT DETECTED Final   Proteus species NOT DETECTED NOT DETECTED Final   Serratia marcescens NOT DETECTED NOT DETECTED Final   Haemophilus influenzae NOT DETECTED NOT DETECTED Final   Neisseria meningitidis NOT DETECTED NOT DETECTED Final   Pseudomonas aeruginosa NOT DETECTED NOT DETECTED Final   Candida albicans NOT DETECTED NOT DETECTED Final   Candida glabrata NOT DETECTED NOT DETECTED Final   Candida krusei NOT DETECTED NOT DETECTED Final   Candida parapsilosis NOT DETECTED NOT DETECTED Final   Candida tropicalis NOT DETECTED NOT DETECTED Final  Culture, blood (routine x 2)     Status: None (Preliminary result)   Collection Time: 03/08/18  3:00 PM  Result Value Ref Range Status   Specimen Description BLOOD RIGHT UPPER ARM  Final   Special Requests   Final    BOTTLES DRAWN AEROBIC AND ANAEROBIC Blood Culture adequate volume   Culture   Final    NO GROWTH 4 DAYS Performed at [facility], [address], [place],  [address]    Report Status PENDING  Incomplete  Culture, blood (routine x 2)     Status: None (Preliminary result)   Collection Time: 03/11/18  7:42 AM  Result Value Ref Range Status   Specimen Description BLOOD RIGHT ANTECUBITAL  Final   Special Requests   Final    BOTTLES DRAWN  AEROBIC AND ANAEROBIC Blood Culture adequate volume    Culture   Final    NO GROWTH 1 DAY Performed at [facility], [place] [address], [place], [place] [address]    Report Status PENDING  Incomplete  Culture, blood (routine x 2)     Status: None (Preliminary result)   Collection Time: 03/11/18  7:42 AM  Result Value Ref Range Status   Specimen Description BLOOD RIGHT ANTECUBITAL  Final   Special Requests   Final    BOTTLES DRAWN AEROBIC AND ANAEROBIC Blood Culture adequate volume   Culture   Final    NO GROWTH 1 DAY Performed at [facility], [place] [address], [place], [place] [address]    Report Status PENDING  Incomplete         Radiology Studies: No results found.      Scheduled Meds: . allopurinol  100 mg Oral Daily  . amiodarone  200 mg Oral Daily  . calcitRIOL  2 mcg Oral Q T,Th,Sa-HD  . Chlorhexidine Gluconate Cloth  6 each Topical Q0600  . [START ON 03/17/2018] darbepoetin (ARANESP) injection - DIALYSIS  200 mcg Intravenous Q Thu-HD  . diclofenac sodium  2 g Topical TID  . feeding supplement (PRO-STAT SUGAR FREE 64)  30 mL Oral BID  . heparin  1,000 Units Intracatheter Once  . midodrine  5 mg Oral Q T,Th,Sa-HD  . oxybutynin  5 mg Oral BID  . pantoprazole  40 mg Oral QHS  . phytonadione  5 mg Oral Daily  . sevelamer carbonate  2,400 mg Oral TID WC  . sodium chloride flush  10-40 mL Intracatheter Q12H  . sodium chloride flush  3 mL Intravenous Q12H  . sodium chloride flush  3 mL Intravenous Q12H   Continuous Infusions: . sodium chloride    . sodium chloride    . sodium chloride    .  ceFAZolin (ANCEF) IV Stopped (23-Mar-2018 2236)     LOS: 5 days     Vernell Leep, MD, FACP, [facility]. [facility] Pager [phone]  If 7PM-7AM, please contact night-coverage [email or web address] Password [email or web address] 03/13/2018, 10:26 AM

## 2018-03-13 NOTE — Progress Notes (Addendum)
[facility] for Infectious Disease    Date of Admission:  03/08/2018   Total days of antibiotics 6 cefazolin  ID: Albert Stanley is a 72 y.o. 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 He remains afebrile, but now needing to use his  HD temp catheter. Dr Jonnie Finner addressing with his family goals of care due to limited option for long term management.   Platelet count improved after platelet transfusion  Patient was unable to tolerate full HD session per report from Dr Jonnie Finner.  Medications:  . allopurinol  100 mg Oral Daily  . amiodarone  200 mg Oral Daily  . calcitRIOL  2 mcg Oral Q T,Th,Sa-HD  . Chlorhexidine Gluconate Cloth  6 each Topical Q0600  . [START ON 03/17/2018] darbepoetin (ARANESP) injection - DIALYSIS  200 mcg Intravenous Q Thu-HD  . diclofenac sodium  2 g Topical TID  . feeding supplement (PRO-STAT SUGAR FREE 64)  30 mL Oral BID  . heparin  1,000 Units Intracatheter Once  . midodrine  5 mg Oral Q T,Th,Sa-HD  . oxybutynin  5 mg Oral BID  . pantoprazole  40 mg Oral QHS  . phytonadione  5 mg Oral Daily  . sevelamer carbonate  2,400 mg Oral TID WC  . sodium chloride flush  10-40 mL Intracatheter Q12H  . sodium  chloride flush  3 mL Intravenous Q12H  . sodium chloride flush  3 mL Intravenous Q12H    Objective: Vital signs in last 24 hours: Temp:  [98 F (36.7 C)-98.8 F (37.1 C)] 98.8 F (37.1 C) (06/02 0325) Pulse Rate:  [68-90] 88 (06/02 0325) Resp:  [14-20] 18 (06/02 0325) BP: (92-120)/(49-76) 117/69 (06/02 0325) SpO2:  [86 %-100 %] 86 % (06/02 0325) Weight:  [182 lb 15.7 oz (83 kg)-183 lb 3.2 oz (83.1 kg)] 182 lb 15.7 oz (83 kg) (06/01 2043)  Physical Exam  Constitutional: He is oriented to person,opens eyes to verbal stimuli. Does not answer questions. He appears chronically ill, older than stated age, well-nourished. No distress.  HENT: eyes have surrounding edema to his upper and lower eyelids Mouth/Throat: Oropharynx is clear and moist. No oropharyngeal exudate.  Cardiovascular: Normal rate, regular rhythm and normal heart sounds. Exam reveals no gallop and no friction rub.  No murmur heard.  Pulmonary/Chest: Effort normal and breath sounds normal. No respiratory distress. He has no wheezes.  Abdominal: Soft. Bowel sounds are decreased.mildly distension. There is no tenderness.  Ext: right arm is markedly swollen and has warm, erythema and swelling near avg to LUE Skin: Skin is warm and dry. No rash noted. No erythema.  Psychiatric: solomnent   Lab Results Recent Labs    03/11/18 0649 03/27/18 0838 03/13/18 0418  WBC 10.1 9.3 7.3  HGB 9.3* 9.0* 9.5*  HCT 26.1* 25.4* 27.3*  NA 137 137  --   K 4.1 4.2  --   CL 94* 95*  --   CO2 29 28  --   BUN 51* 58*  --   CREATININE 6.33* 6.94*  --    Liver Panel Recent Labs    03/11/18 0649 03-24-18 0838  PROT 4.7*  --   ALBUMIN 1.9* 1.9*  AST 32  --   ALT 11*  --   ALKPHOS 65  --   BILITOT 3.1*  --   BILIDIR 1.7*  --   IBILI 1.4*  --     Microbiology: 5/28 blood cx group b strep 5/30 blood cx ngtd Studies/Results: No results found.   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 Depending on family goals of care discussion treatment course will be complicated  Ideally his graft will need to be debrided/removal but patient is at risk for complications given thrombocytopenia, and his other comorbidities. His current temp HD line should also be removed. Secondary concern is that his temp line is not functioning sufficiently for patient to undergo full session of HD  I have talked to family that this is a difficult infection to fight. They would like til Tuesday to see how he is doing before making decision for hospice.  Would recommend to continue with cefazolin will add rifampin iv for now see if he is having some improvement where we would address his goals of care  Dr Linus Salmons to see tomorrow  Spent 45 min with patient's family with greater than 50% in counseling on his prognosis  [facility] for Infectious Diseases Cell: [phone] Pager: [phone]  03/13/2018, 9:55 AM

## 2018-03-13 NOTE — Progress Notes (Signed)
Met with pt's wife and daughters (one on the phone), a niece and the wife's brother.  Pt's AV graft is infected/ abscessed now w/ a large mass; the tunneled hd catheter is likely infected also but we were able to use it yesterday for dialysis but only at 250 cc bfr.   Patient is not a surgical candidate. Prognosis in short-term and long-term is very poor.  Discussed two options for family to consider: (1) continue medical Rx with IV abx and hemodialysis assuming catheter works and bp's stable enough and see how things go, vs (2) transition to hospice / comfort care (no further dialysis , antibiotics, etc) which would likely be done here in the hospital.  Family would like a little time to discuss the options, for now will continue w/ current Rx.    Kelly Splinter MD [employer] 03/13/2018, 3:08 PM

## 2018-03-13 NOTE — NC FL2 (Signed)
[place] LEVEL OF CARE SCREENING TOOL     IDENTIFICATION  Patient Name: Albert Stanley Birthdate: 1946-04-27 Sex: male Admission Date (Current Location): 03/08/2018  [facility] and [place] Number:  Herbalist and Address:  [facility] [place], [place] [address], [place], [place] [address]      Provider Number: [id number]  Attending Physician Name and Address:  Modena Jansky, MD  Relative Name and Phone Number:       Current Level of Care: Hospital Recommended Level of Care: [place] Prior Approval Number:    Date Approved/Denied:   PASRR Number:    Discharge Plan: SNF    Current Diagnoses: Patient Active Problem List   Diagnosis Date Noted  . Thrombocytopenia ([place])   . Coagulopathy ([place])   . Sepsis due to undetermined organism with metabolic encephalopathy ([place]) 03/08/2018  . Benign fibroma of prostate 03/08/2018  . Cardiac arrhythmia 03/08/2018  . Chronic pain 03/08/2018  . Deep vein thrombosis (DVT) ([place]) 03/08/2018  . Gout 03/08/2018  . HLD (hyperlipidemia) 03/08/2018  . LBP (low back pain) 03/08/2018  . Malignant neoplasm of urinary bladder ([place]) 03/08/2018  . Foley catheter problem ([place]) 03/08/2018  . Pressure injury of skin 02/09/2018  . Hypoglycemia secondary to sulfonylurea 02/04/2018  . Hypoglycemia 02/03/2018  . Hematuria 01/06/2018  . Labile blood pressure   . Essential hypertension   . Acute on chronic diastolic heart failure ([place])   . Type 2 diabetes mellitus with peripheral neuropathy ([name])   . Acute deep vein thrombosis (DVT) of femoral vein of right lower extremity ([place])   . Traumatic subdural hematoma ([place]) 12/16/2017  . ESRD (end stage renal disease) ([place])   . ESRD (end stage renal disease) on dialysis ([place])   . Dysphagia   . Acute blood loss anemia   . Anemia of chronic disease   . Chronic diastolic congestive heart failure ([place])   . Diabetes mellitus type 2 in nonobese ([name])   .  Benign essential HTN   . Nonischemic cardiomyopathy ([place])   . Transaminitis   . Encounter for central line placement   . Endotracheally intubated   . Ventilator dependent ([place])   . Chronic renal impairment   . Chronic in-center hemodialysis status ([place])   . S/P craniotomy 12/05/2017  . Hyperkalemia 12/02/2017  . Malnutrition of moderate degree 12/18/2016  . PVC's (premature ventricular contractions)   . Anemia associated with chronic renal failure   . End-stage renal disease on hemodialysis ([place]) 12/09/2016  . Dyslipidemia associated with type 2 diabetes mellitus ([place]) 12/09/2016  . Acute on chronic systolic and diastolic heart failure, NYHA class 1 ([place]) 12/09/2016  . 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 Type 2 diabetes mellitus ([place]) 12/11/2015  . Fluid overload 12/06/2015  . History of DVT (deep vein thrombosis) 12/04/2015  . Renal hematoma 12/04/2015  . H/O: gout 11/16/2013  . Trochanteric bursitis 10/17/2013  . 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 Lumbar degenerative disc disease 07/17/2013    Orientation RESPIRATION BLADDER Height & Weight     Self  Normal Continent Weight: 182 lb 15.7 oz (83 kg) Height:  5\' 7"  (170.2 cm)  BEHAVIORAL SYMPTOMS/MOOD NEUROLOGICAL BOWEL NUTRITION STATUS      Continent Diet(DYS1, thin fluids)  AMBULATORY STATUS COMMUNICATION OF NEEDS Skin   Limited Assist Verbally Normal  Personal Care Assistance Level of Assistance  Bathing, Feeding, Dressing Bathing Assistance: Limited assistance Feeding assistance: Limited assistance Dressing Assistance: Limited assistance     Functional Limitations Info  Sight, Hearing, Speech Sight Info: Adequate Hearing Info: Impaired Speech Info: Adequate    SPECIAL CARE FACTORS FREQUENCY  PT (By licensed PT), OT (By licensed OT)     PT Frequency: 2x OT Frequency: 2x            Contractures Contractures Info: Not  present    Additional Factors Info  Code Status Code Status Info: DNR Allergies Info: Meperidine, Penicillins, Sulfa Antibiotics, Sulfonamide Derivatives, Lac Bovis, Lactose Intolerance (Gi), Milk-related Compounds, Foltx Elfolate Plus, Betadine Povidone Iodine, Eggs Or Egg-derived Products           Current Medications (03/13/2018):  This is the current hospital active medication list Current Facility-Administered Medications  Medication Dose Route Frequency Provider Last Rate Last Dose  . 0.9 %  sodium chloride infusion  250 mL Intravenous PRN Lady Deutscher, MD      . 0.9 %  sodium chloride infusion   Intravenous Once Hongalgi, Anand D, MD      . 0.9 %  sodium chloride infusion   Intravenous Once Vernell Leep D, MD      . acetaminophen (TYLENOL) tablet 500 mg  500 mg Oral Q6H PRN Modena Jansky, MD   500 mg at 03/10/18 2252  . allopurinol (ZYLOPRIM) tablet 100 mg  100 mg Oral Daily Charlton Haws, RPH   100 mg at 03/13/18 0949  . amiodarone (PACERONE) tablet 200 mg  200 mg Oral Daily Lady Deutscher, MD   200 mg at 03/13/18 0948  . calcitRIOL (ROCALTROL) capsule 2 mcg  2 mcg Oral Q T,Th,Sa-HD Alric Seton, PA-C      . ceFAZolin (ANCEF) IVPB 1 g/50 mL premix  1 g Intravenous Q24H Rolla Flatten, [place] at Mar 29, 2018 2236  . Chlorhexidine Gluconate Cloth 2 % PADS 6 each  6 each Topical Q dialysis Roney Jaffe, MD      . Chlorhexidine Gluconate Cloth 2 % PADS 6 each  6 each Topical Q0600 Alric Seton, PA-C   6 each at 03/13/18 [phone]  . [START ON 03/17/2018] Darbepoetin Alfa (ARANESP) injection 200 mcg  200 mcg Intravenous Q Thu-HD Alric Seton, PA-C      . diclofenac sodium (VOLTAREN) 1 % transdermal gel 2 g  2 g Topical TID Lady Deutscher, MD   2 g at 03/13/18 0949  . feeding supplement (PRO-STAT SUGAR FREE 64) liquid 30 mL  30 mL Oral BID Lady Deutscher, MD   30 mL at 03/13/18 0950  . heparin injection 1,000 Units  1,000 Units Intracatheter  Once Vernell Leep D, MD      . midodrine (PROAMATINE) tablet 5 mg  5 mg Oral Q T,Th,Sa-HD Lady Deutscher, MD   5 mg at 03/10/18 1415  . ondansetron (ZOFRAN) tablet 4 mg  4 mg Oral Q6H PRN Lady Deutscher, MD       Or  . ondansetron [facility]) injection 4 mg  4 mg Intravenous Q6H PRN Lady Deutscher, MD      . oxybutynin (DITROPAN) tablet 5 mg  5 mg Oral BID Lady Deutscher, MD   5 mg at 03/13/18 0948  . pantoprazole (PROTONIX) EC tablet 40 mg  40 mg Oral QHS Lady Deutscher, MD   40 mg at 03/29/18 2200  . phytonadione (VITAMIN K)  tablet 5 mg  5 mg Oral Daily Modena Jansky, MD   5 mg at 03/13/18 0949  . sevelamer carbonate (RENVELA) tablet 2,400 mg  2,400 mg Oral TID WC Lady Deutscher, MD      . sodium chloride flush (NS) 0.9 % injection 10-40 mL  10-40 mL Intracatheter Q12H Lady Deutscher, MD      . sodium chloride flush (NS) 0.9 % injection 10-40 mL  10-40 mL Intracatheter PRN Lady Deutscher, MD      . sodium chloride flush (NS) 0.9 % injection 3 mL  3 mL Intravenous Q12H Lady Deutscher, MD   3 mL at 03/13/18 0953  . sodium chloride flush (NS) 0.9 % injection 3 mL  3 mL Intravenous Q12H Lady Deutscher, MD   3 mL at 04/08/2018 1515  . sodium chloride flush (NS) 0.9 % injection 3 mL  3 mL Intravenous PRN Lady Deutscher, MD   3 mL at 03/13/18 8757     Discharge Medications: Please see discharge summary for a list of discharge medications.  Relevant Imaging Results:  Relevant Lab Results:   Additional Information SN: [id number]  [place] Chard, LCSW

## 2018-03-13 NOTE — Progress Notes (Addendum)
[facility] Progress Note   Dialysis Orders: tts gkc 4h   88.5kg  Profile 4 2/2 bath heparin none- except in catheter (due to hx SDH) lua avg / L IJ tdc Venofer 50, Mircera 200 last 5/23, calcitriol 3.25 left upper AVGG and left IJ TDC  Assessment/Plan: 1. Group B strep bacteremia on Ancef possible abscess on AV graft LUA over antecubital wound site noted Satuday which was new, which would indicate that the AVG is infected as cause of his bacteremia.  Poor surgical candidate at this point given ams / sepsis and overall FTT.  Dr. Jonnie Finner to discuss with family 2  ESRD - TTS - HD Sat using TDC unable to stick AVG due to large mass/ abscess, used [facility] Saturday  3. Anemia - hgb 9.5 -  not due for ESA redose holding Fe due to acute infection 4. Secondary hyperparathyroidism - corrected Ca  relatively high dose calcitriol as an outpt but hasn't received any here yet - resumed at 2 mcg 5. HTN/volume - net UF 1 L 6/1  - below outpt edw and minimal edema 6. Nutrition - alb 1.9 - prostat ordered; pureed diet w supervision ST guidelines- has not had breakfast yet -minimal intake recorded Saturday 7. AMS - secondary to bacteremia/ sepsis 8. FTT - palliative care consulting 9. Thrombocytopenia - s/p plt transfusion 5/30 and 5/31 - now 46 K 10. DM - high risk for hypoglycemia due to infection/poor intakes 11. 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 DNR 13. Prognosis - very poor - Dr. Jonnie Finner to speak with wife later today.   Amalia Hailey, PA-C [facility] Pager [phone] 03/13/2018, 7:58 AM  Pt seen, examined and agree w A/P as above.  Kelly Splinter MD [facility] pager [phone]   03/13/2018, 3:03 PM     Subjective:  "Albert Stanley"  Objective Vitals:   2018/04/08 1416 04/08/2018 1711 04-08-18 2043 03/13/18 0325  BP: 104/70 120/61 120/65 117/69  Pulse: 83 90 86 88  Resp: 20 20 17 18   Temp: 98 F (36.7 C) 98 F  (36.7 C) 98.3 F (36.8 C) 98.8 F (37.1 C)  TempSrc: Oral Oral Oral Oral  SpO2: (!) 88% 100% 91% (!) 86%  Weight:   83 kg (182 lb 15.7 oz)   Height:       Physical Exam General: awake, able to tell me name, denies pain,  Heart: RRR Lungs: grossly clear, poor expansion Abdomen:soft NT  Extremities:tr LE edema, poor muscle tone, skin on feet peeling, right arm ecchymosis mittens on hands Dialysis Access: left IJ and left upper AVGG + bruit with swollen mass in antecubital area but no orvert erythema or tenderness on exam, ^  LUE swelling ? Central stenosis from cath Neuro: more conversant - can understand short answers - sentences intelligle   Additional Objective Labs: Basic Metabolic Panel: Recent Labs  Lab 03/10/18 0437 03/11/18 0649 Apr 08, 2018 0838  NA 138 137 137  K 3.8 4.1 4.2  CL 92* 94* 95*  CO2 28 29 28   GLUCOSE 47* 89 84  BUN 36* 51* 58*  CREATININE 5.36* 6.33* 6.94*  CALCIUM 9.0 8.8* 8.4*  PHOS  --   --  6.0*   Liver Function Tests: Recent Labs  Lab 03/08/18 1319 03/10/18 0437 03/11/18 0649 2018/04/08 0838  AST 31 33 32  --   ALT 18 17 11*  --   ALKPHOS 72 58 65  --   BILITOT 2.9*  3.1* 3.1*  --   PROT 5.5* 5.2* 4.7*  --   ALBUMIN 2.3* 1.9* 1.9* 1.9*   No results for input(s): LIPASE, AMYLASE in the last 168 hours. CBC: Recent Labs  Lab 03/08/18 1319  03/10/18 0437 03/10/18 1617 03/11/18 0649 Mar 23, 2018 0838 03/13/18 0418  WBC 6.8   < > 11.1* 10.0 10.1 9.3 7.3  NEUTROABS 6.1  --  8.0*  --   --   --   --   HGB 10.6*   < > 9.8* 9.2* 9.3* 9.0* 9.5*  HCT 30.7*   < > 27.8* 26.1* 26.1* 25.4* 27.3*  MCV 81.6   < > 79.9 79.6 79.6 77.4* 79.4  PLT 25*   < > 13* 22* 19* 65* 46*   < > = values in this interval not displayed.   Blood Culture    Component Value Date/Time   SDES BLOOD RIGHT ANTECUBITAL 03/11/2018 0742   SDES BLOOD RIGHT ANTECUBITAL 03/11/2018 0742   SPECREQUEST  03/11/2018 0742    BOTTLES DRAWN AEROBIC AND ANAEROBIC Blood Culture adequate  volume   SPECREQUEST  03/11/2018 0742    BOTTLES DRAWN AEROBIC AND ANAEROBIC Blood Culture adequate volume   CULT  03/11/2018 0742    NO GROWTH 1 DAY Performed at [facility], [place] [address], [place], [place] [address]    CULT  03/11/2018 [phone]    NO GROWTH 1 DAY Performed at [place] [address], [place], [place] [address]    REPTSTATUS PENDING 03/11/2018 0742   REPTSTATUS PENDING 03/11/2018 0742    Cardiac Enzymes: No results for input(s): CKTOTAL, CKMB, CKMBINDEX, TROPONINI in the last 168 hours. CBG: Recent Labs  Lab 03-23-18 1533 23-Mar-2018 1709 2018-03-23 2043 03/13/18 0323 03/13/18 0742  GLUCAP 76 78 104* 132* 127*   Iron Studies: No results for input(s): IRON, TIBC, TRANSFERRIN, FERRITIN in the last 72 hours. Lab Results  Component Value Date   INR 1.76 03/13/2018   INR 1.99 03/08/2018   INR 1.59 01/18/2018   Studies/Results: No results found. Medications: . sodium chloride    . sodium chloride    . sodium chloride    .  ceFAZolin (ANCEF) IV Stopped (23-Mar-2018 2236)   . allopurinol  100 mg Oral Daily  . amiodarone  200 mg Oral Daily  . calcitRIOL  2 mcg Oral Q T,Th,Sa-HD  . Chlorhexidine Gluconate Cloth  6 each Topical Q0600  . [START ON 03/17/2018] darbepoetin (ARANESP) injection - DIALYSIS  200 mcg Intravenous Q Thu-HD  . diclofenac sodium  2 g Topical TID  . feeding supplement (PRO-STAT SUGAR FREE 64)  30 mL Oral BID  . heparin  1,000 Units Intracatheter Once  . midodrine  5 mg Oral Q T,Th,Sa-HD  . oxybutynin  5 mg Oral BID  . pantoprazole  40 mg Oral QHS  . phytonadione  5 mg Oral Daily  . sevelamer carbonate  2,400 mg Oral TID WC  . sodium chloride flush  10-40 mL Intracatheter Q12H  . sodium chloride flush  3 mL Intravenous Q12H  . sodium chloride flush  3 mL Intravenous Q12H

## 2018-03-14 DIAGNOSIS — A401 Sepsis due to streptococcus, group B: Secondary | ICD-10-CM

## 2018-03-14 DIAGNOSIS — Z515 Encounter for palliative care: Secondary | ICD-10-CM

## 2018-03-14 DIAGNOSIS — M79604 Pain in right leg: Secondary | ICD-10-CM

## 2018-03-14 DIAGNOSIS — D696 Thrombocytopenia, unspecified: Secondary | ICD-10-CM

## 2018-03-14 DIAGNOSIS — Z91012 Allergy to eggs: Secondary | ICD-10-CM

## 2018-03-14 DIAGNOSIS — Z882 Allergy status to sulfonamides status: Secondary | ICD-10-CM

## 2018-03-14 DIAGNOSIS — R7881 Bacteremia: Secondary | ICD-10-CM

## 2018-03-14 DIAGNOSIS — N186 End stage renal disease: Secondary | ICD-10-CM

## 2018-03-14 DIAGNOSIS — Z91011 Allergy to milk products: Secondary | ICD-10-CM

## 2018-03-14 DIAGNOSIS — Z880 Allergy status to penicillin: Secondary | ICD-10-CM

## 2018-03-14 DIAGNOSIS — B951 Streptococcus, group B, as the cause of diseases classified elsewhere: Secondary | ICD-10-CM

## 2018-03-14 LAB — CBC
HCT: 26.4 % — ABNORMAL LOW (ref 39.0–52.0)
Hemoglobin: 9.4 g/dL — ABNORMAL LOW (ref 13.0–17.0)
MCH: 28 pg (ref 26.0–34.0)
MCHC: 35.6 g/dL (ref 30.0–36.0)
MCV: 78.6 fL (ref 78.0–100.0)
PLATELETS: 49 10*3/uL — AB (ref 150–400)
RBC: 3.36 MIL/uL — ABNORMAL LOW (ref 4.22–5.81)
RDW: 19.6 % — AB (ref 11.5–15.5)
WBC: 6.6 10*3/uL (ref 4.0–10.5)

## 2018-03-14 LAB — GLUCOSE, CAPILLARY
GLUCOSE-CAPILLARY: 186 mg/dL — AB (ref 65–99)
GLUCOSE-CAPILLARY: 147 mg/dL — AB (ref 65–99)
Glucose-Capillary: 165 mg/dL — ABNORMAL HIGH (ref 65–99)
Glucose-Capillary: 168 mg/dL — ABNORMAL HIGH (ref 65–99)
Glucose-Capillary: 161 mg/dL — ABNORMAL HIGH (ref 65–99)

## 2018-03-14 MED ORDER — LORAZEPAM 2 MG/ML IJ SOLN
0.5000 mg | INTRAMUSCULAR | Status: DC | PRN
  Administered 2018-03-15 – 2018-03-16 (×2): 0.5 mg via INTRAVENOUS
  Filled 2018-03-14 (×2): qty 1

## 2018-03-14 MED ORDER — RENA-VITE PO TABS: 1.0000 | ORAL_TABLET | Freq: Every day | ORAL | Status: DC

## 2018-03-14 MED ORDER — HYDROMORPHONE HCL 1 MG/ML IJ SOLN
0.5000 mg | INTRAMUSCULAR | Status: DC | PRN
  Administered 2018-03-14 (×2): 0.5 mg via INTRAVENOUS
  Filled 2018-03-14 (×2): qty 1

## 2018-03-14 MED ORDER — GLYCOPYRROLATE 0.2 MG/ML IJ SOLN
0.2000 mg | INTRAMUSCULAR | Status: DC | PRN
  Filled 2018-03-14: qty 1

## 2018-03-14 NOTE — Progress Notes (Addendum)
Daily Progress Note   Patient Name: Albert Stanley       Date: 03/14/2018 DOB: Jul 21, 1946  Age: 72 y.o. MRN#: [id number] Attending Physician: Modena Jansky, MD Primary Care Physician: [name], MD Admit Date: 03/08/2018  Reason for Consultation/Follow-up: Establishing goals of care and Terminal Care  Subjective/GOC:  9476: Initially met with patient and wife at bedside this afternoon. Patient can tell me his name and that he is at the hospital. He complains of right leg pain for which I helped reposition him in the chair multiple times. RN recently gave tylenol.   Introduced myself to wife, Bertram Millard, with palliative medicine team. Discussed diagnoses, interventions, and underlying co-morbidities. She recalls her conversation with Dr. Jonnie Finner yesterday. "A conversation I really didn't want to have." Ruby is encouraged today by Wilford's improvement in mental status and ability to sit in the chair. She speaks of not being able to make the "decision" today regarding discontinuing dialysis or not. She tells me "it is in God's hands" and wishes to see how dialysis goes tomorrow. Today is their 47th wedding anniversary. Therapeutic listening as Bertram Millard shares stories of her and [place]. PMT contact information given.   1515: Received call from wife requesting follow-up this afternoon. Upon arrival to room, wife is tearful. She requests we speak in waiting room. After I left earlier, Ruby tells me of a serious conversation she had with Broadus John. He told her this afternoon he does not want further prolonged interventions and is ready to "go to [facility]." She further asked if he was ready to stop dialysis and he told her yes. Ruby requests we have another conversation with him.   Upon arrival to room, Keaton  wakes to voice. He asks "what we are waiting for." I updated him on diagnoses and poor prognosis with bad infection. I asked him if he understands what will happen when we stop dialysis. Lyall tells me he will "go back to where he came from" and that he is "ready to die." He again speaks of wanting no further prolonging interventions and wishes for everything to be removed. He becomes irritable and tells [place] he is "tired" and asks "how hard is it to die?"   I educated on transition to comfort measures only with goal of comfort, quality, and dignity at EOL. Educated on  medications as needed to ensure comfort and relief from suffering. Patient and wife agree.   Daughter, Threasa Beards, arrives to bedside during my visit and also heard her father verbalize that he wishes to discontinue dialysis and that he is ready to die.   Ruby needs to leave the hospital for a meeting but prior to leaving, I clarified comfort focused care. Ruby agreeable for me to give stronger medication for leg pain. "Do whatever you need to do to make him comfortable." Ruby speaks of needing to update the patient's other daughter and sister of his wishes. She tells me she will call me in the morning.    Length of Stay: 6  Current Medications: Scheduled Meds:  . allopurinol  100 mg Oral Daily  . amiodarone  200 mg Oral Daily  . calcitRIOL  2 mcg Oral Q T,Th,Sa-HD  . Chlorhexidine Gluconate Cloth  6 each Topical Q0600  . [START ON 03/17/2018] darbepoetin (ARANESP) injection - DIALYSIS  200 mcg Intravenous Q Thu-HD  . diclofenac sodium  2 g Topical TID  . feeding supplement (PRO-STAT SUGAR FREE 64)  30 mL Oral BID  . heparin  1,000 Units Intracatheter Once  . midodrine  5 mg Oral Q T,Th,Sa-HD  . multivitamin  1 tablet Oral QHS  . oxybutynin  5 mg Oral BID  . pantoprazole  40 mg Oral QHS  . sevelamer carbonate  2,400 mg Oral TID WC  . sodium chloride flush  10-40 mL Intracatheter Q12H  . sodium chloride flush  3 mL Intravenous Q12H   . sodium chloride flush  3 mL Intravenous Q12H    Continuous Infusions: . sodium chloride    .  ceFAZolin (ANCEF) IV Stopped (03/13/18 2235)  . rifampin (RIFADIN) IVPB Stopped (03/14/18 1126)    PRN Meds: sodium chloride, acetaminophen, Chlorhexidine Gluconate Cloth, ondansetron **OR** ondansetron (ZOFRAN) IV, sodium chloride flush, sodium chloride flush  Physical Exam  Constitutional: He appears ill.  HENT:  Head: Normocephalic and atraumatic.  Pulmonary/Chest: No accessory muscle usage. No tachypnea. No respiratory distress.  Neurological: He is alert.  Oriented to person/place.  Skin: Skin is warm and dry.  Nursing note and vitals reviewed.          Vital Signs: BP 111/67 (BP Location: Right Arm)   Pulse 83   Temp 97.7 F (36.5 C) (Oral)   Resp 16   Ht '5\' 7"'  (1.702 m)   Wt 83 kg (182 lb 15.7 oz)   SpO2 100%   BMI 28.66 kg/m  SpO2: SpO2: 100 % O2 Device: O2 Device: Nasal Cannula O2 Flow Rate: O2 Flow Rate (L/min): 4 L/min  Intake/output summary:   Intake/Output Summary (Last 24 hours) at 03/14/2018 1401 Last data filed at 03/14/2018 0945 Gross per 24 hour  Intake 630 ml  Output 0 ml  Net 630 ml   LBM: Last BM Date: 03/14/18 Baseline Weight: Weight: 89 kg (196 lb 3.4 oz) Most recent weight: Weight: 83 kg (182 lb 15.7 oz)       Palliative Assessment/Data: PPS 30%     Patient Active Problem List   Diagnosis Date Noted  . Thrombocytopenia ([place])   . Coagulopathy ([place])   . Sepsis due to undetermined organism with metabolic encephalopathy ([place]) 03/08/2018  . Benign fibroma of prostate 03/08/2018  . Cardiac arrhythmia 03/08/2018  . Chronic pain 03/08/2018  . Deep vein thrombosis (DVT) ([place]) 03/08/2018  . Gout 03/08/2018  . HLD (hyperlipidemia) 03/08/2018  . LBP (low back pain) 03/08/2018  .  Malignant neoplasm of urinary bladder ([place]) 03/08/2018  . Foley catheter problem ([place]) 03/08/2018  . Pressure injury of skin 02/09/2018  . Hypoglycemia secondary to  sulfonylurea 02/04/2018  . Hypoglycemia 02/03/2018  . Hematuria 01/06/2018  . Labile blood pressure   . Essential hypertension   . Acute on chronic diastolic heart failure ([place])   . Type 2 diabetes mellitus with peripheral neuropathy ([name])   . Acute deep vein thrombosis (DVT) of femoral vein of right lower extremity ([place])   . Traumatic subdural hematoma ([place]) 12/16/2017  . ESRD (end stage renal disease) ([place])   . ESRD (end stage renal disease) on dialysis ([place])   . Dysphagia   . Acute blood loss anemia   . Anemia of chronic disease   . Chronic diastolic congestive heart failure ([place])   . Diabetes mellitus type 2 in nonobese ([name])   . Benign essential HTN   . Nonischemic cardiomyopathy ([place])   . Transaminitis   . Encounter for central line placement   . Endotracheally intubated   . Ventilator dependent ([place])   . Chronic renal impairment   . Chronic in-center hemodialysis status ([place])   . S/P craniotomy 12/05/2017  . Hyperkalemia 12/02/2017  . Malnutrition of moderate degree 12/18/2016  . PVC's (premature ventricular contractions)   . Anemia associated with chronic renal failure   . End-stage renal disease on hemodialysis ([place]) 12/09/2016  . Dyslipidemia associated with type 2 diabetes mellitus ([place]) 12/09/2016  . Acute on chronic systolic and diastolic heart failure, NYHA class 1 ([place]) 12/09/2016  . Diabetes mellitus with diabetic nephropathy without long-term current use of insulin ([place]) 12/09/2016  . Type 2 diabetes mellitus ([place]) 12/11/2015  . Fluid overload 12/06/2015  . History of DVT (deep vein thrombosis) 12/04/2015  . Renal hematoma 12/04/2015  . H/O: gout 11/16/2013  . Trochanteric bursitis 10/17/2013  . 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 Lumbar degenerative disc disease 07/17/2013    Palliative Care Assessment & Plan   Patient Profile: 72 y.o. male admitted on 03/08/2018 from [facility] with increased confusion. He has a past medical history significant for ESRD on  TTS HD, chronic hypotension on midodrine, DM type II, NICM EF 11%, chronic systolic CHF, SDH 03/5789 status post craniotomy, right femoral DVT status post IVC filter, urinary retention, bladder cancer status post resection, renal mass, recent hospitalization 02/03/2018-02/07/2018 for refractory hypoglycemia. Six admissions in the last six months. This admission, patient septic secondary to group B strep bacteremia likely due to abscess at left upper extremity AVG or TDC infection. ID following. Poor prognosis. Palliative medicine consultation for goals of care.   Assessment: Sepsis Group B strep bacteremia ESRD on hemodialysis Thrombocytopenia Acute metabolic encephalopathy Chronic urinary retention s/p bladder cancer/resection Adult failure to thrive Hx of recurrent DVT Hx of SDH s/p craniotomy  Recommendations/Plan:  DNR/DNI  Patient awake and alert today. Patient verbalizes to his wife, daughter, and myself that he wishes to stop life-prolonging measures including dialysis. He understands poor prognosis and repeatedly tells his family and I that he is ready to die and "how hard is it to die?" He demands to "remove everything."    Educated on shift to comfort focused care with focus on symptom management.   Symptom management  Dilaudid 0.23m IV q3h prn pain/dyspnea/air hunger   Ativan 0.543mIV q4h prn anxiety/agitation  Robinul 0.77m24mV q4h prn secretions  Remove foley catheter and cardiac monitor. Patient continuously asks for foley to be removed.   Allow comfort feeds per  patient/family request.   No further dialysis.   Wife plans to update patient's daughter and sister tonight. Will continue antibiotics for now until f/u with wife tomorrow.   Will further discuss hospice options with wife. Patient eligible for residential hospice with discontinuation of hemodialysis and prognosis of <2 weeks if not days once all life-prolonging interventions are discontinued.   Code  Status: DNR/DNI   Code Status Orders  (From admission, onward)        Start     Ordered   03/08/18 1615  Do not attempt resuscitation (DNR)  Continuous    Question Answer Comment  In the event of cardiac or respiratory ARREST Do not call a "code blue"   In the event of cardiac or respiratory ARREST Do not perform Intubation, CPR, defibrillation or ACLS   In the event of cardiac or respiratory ARREST Use medication by any route, position, wound care, and other measures to relive pain and suffering. May use oxygen, suction and manual treatment of airway obstruction as needed for comfort.      03/08/18 1620    Code Status History    Date Active Date Inactive Code Status Order ID Comments User Context   02/08/2018 1753 02/11/2018 2021 Full Code [id number]  Elwyn Reach, MD Inpatient   02/03/2018 1331 02/07/2018 1853 Full Code [id number]  Radene Gunning, NP ED   12/16/2017 1805 01/05/2018 1630 Full Code [id number]  Cathlyn Parsons, PA-C Inpatient   12/16/2017 1805 12/16/2017 1805 Full Code [id number]  Cathlyn Parsons, PA-C Inpatient   12/05/2017 1933 12/16/2017 1755 Full Code [id number]  Omar Person, NP Inpatient   12/09/2016 2020 12/18/2016 1853 Full Code [id number]  Theodis Blaze, MD Inpatient   06/25/2016 1139 06/25/2016 1531 Full Code [id number]  Serafina Mitchell, MD Inpatient   06/09/2016 1002 06/09/2016 1726 Full Code [id number]  Serafina Mitchell, MD Inpatient   02/28/2016 1239 02/28/2016 1926 Full Code [id number]  Larey Dresser, MD Inpatient   12/04/2015 1824 12/18/2015 2208 Full Code [id number]  Bonnielee Haff, MD Inpatient    Advance Directive Documentation     Most Recent Value  Type of Advance Directive  Out of facility DNR (pink MOST or yellow form)  Pre-existing out of facility DNR order (yellow form or pink MOST form)  Yellow form placed in chart (order not valid for inpatient use)  "MOST" Form in Place?  -       Prognosis:   < 2 weeks secondary ESRD and discontinuation of  hemodialysis and with bacteremia secondary to ? AVG/TDC infection.   Discharge Planning:  To Be Determined  Care plan was discussed with patient, wife, daughter, RN, Dr. Algis Liming, Dr. [name]  Thank you for allowing the Palliative Medicine Team to assist in the care of this patient.   Time In: 1315- 1515- Time Out: 1345 1600 Total Time 8mn Prolonged Time Billed yes      Greater than 50%  of this time was spent counseling and coordinating care related to the above assessment and plan.  MIhor Dow FNP-C Palliative Medicine Team  Phone: [phone]: [phone] Please contact Palliative Medicine Team phone at [phone] questions and concerns.

## 2018-03-14 NOTE — Progress Notes (Addendum)
Subjective: "my arm hurts"  Objective Vital signs in last 24 hours: Vitals:   03/13/18 1029 03/13/18 1625 03/13/18 2031 03/14/18 0310  BP: 113/85 118/62 124/64 108/73  Pulse: 71 87 90 (!) 121  Resp: 20 16 19 18   Temp: 98 F (36.7 C) 97.9 F (36.6 C) 98 F (36.7 C) 97.7 F (36.5 C)  TempSrc: Oral Oral Oral Oral  SpO2: 99% 100% 100% 95%  Weight:   83 kg (182 lb 15.7 oz)   Height:       Weight change: -1.5 kg (-3 lb 4.9 oz)  Physical Exam: General: awake and pleasantly  confused X3 . NAD  Heart: Tachy regular rate , 1/6 sem  norub or gallop Lungs: CTA Abdomen: BS pos, Soit, non tender , non distended   Extremities: 1+ bipedal  Edema / With bilat Mittens, Right arm ecchymosis  Dialysis Access: + bruit  LUA AVG  With swelling / Judy Pimple cath    OP Dialysis Orders: tts gkc 4h88.5kg Profile 4 2/2 bathheparin none- except in catheter (due to hx SDH)lua avg/ L IJ tdc Venofer 50, Mircera 200 last 5/23, calcitriol 3.25 left upper AVGG and left IJ TDC    Problem/Plan: 1. ESRD - TTS - HD Sat used Perm cath as  unable to stick AVG due to large mass/ abscess, 2  Group B strep bacteremia - ID seeing /on Ancef /and Rifampin now  abscess on AV graft LUA over antecubital wound site/   Poor surgical candidate at this point given ams / sepsis and overall FTT.  3. Anemia - hgb 9.4  -  not due for ESA redose holding Fe due to acute infection 4. Secondary hyperparathyroidism - corrected Ca  10.2 (14-Mar-2018) dose  Calcitriol  2 mcg/ phos 6 on Renvela 2400 mg  Ac  5. HTN/volume -   bp OK ,net UF 1 L 6/1  - below outpt edw and pedal edema  edema 6. Nutrition - alb 1.9 - prostat /; pureed diet w supervision ST guidelines-  7. AMS - secondary to bacteremia/ sepsis 8. FTT - palliative care consulting 9. Thrombocytopenia - s/p plt transfusion 5/30 and 5/31 -  10. DM - high risk for hypoglycemia due to infection/poor intakes 11. 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  DNR 13. Prognosis - very poor  Family  to decide soon continue  HD or not per Dr Jonnie Finner  Note yest .  Ernest Haber, PA-C [place] [phone] 03/14/2018,8:40 AM  LOS: 6 days  I have seen and examined this patient and agree with the plan of care seen, examined, eval, discussed with PA. Family decision pending very poor outlook .  Jennalyn Cawley 03/14/2018, 10:20 AM  Labs: Basic Metabolic Panel: Recent Labs  Lab 03/10/18 0437 03/11/18 0649 March 14, 2018 0838  NA 138 137 137  K 3.8 4.1 4.2  CL 92* 94* 95*  CO2 28 29 28   GLUCOSE 47* 89 84  BUN 36* 51* 58*  CREATININE 5.36* 6.33* 6.94*  CALCIUM 9.0 8.8* 8.4*  PHOS  --   --  6.0*   Liver Function Tests: Recent Labs  Lab 03/08/18 1319 03/10/18 0437 03/11/18 0649 March 14, 2018 0838  AST 31 33 32  --   ALT 18 17 11*  --   ALKPHOS 72 58 65  --   BILITOT 2.9* 3.1* 3.1*  --   PROT 5.5* 5.2* 4.7*  --   ALBUMIN 2.3* 1.9* 1.9* 1.9*  No results for input(s): LIPASE, AMYLASE in the last 168 hours. No results for input(s): AMMONIA in the last 168 hours. CBC: Recent Labs  Lab 03/08/18 1319  03/10/18 0437 03/10/18 1617 03/11/18 0649 03/25/18 0838 03/13/18 0418  WBC 6.8   < > 11.1* 10.0 10.1 9.3 7.3  NEUTROABS 6.1  --  8.0*  --   --   --   --   HGB 10.6*   < > 9.8* 9.2* 9.3* 9.0* 9.5*  HCT 30.7*   < > 27.8* 26.1* 26.1* 25.4* 27.3*  MCV 81.6   < > 79.9 79.6 79.6 77.4* 79.4  PLT 25*   < > 13* 22* 19* 65* 46*   < > = values in this interval not displayed.   Cardiac Enzymes: No results for input(s): CKTOTAL, CKMB, CKMBINDEX, TROPONINI in the last 168 hours. CBG: Recent Labs  Lab 03/13/18 1626 03/13/18 2035 03/13/18 2353 03/14/18 0307 03/14/18 0749  GLUCAP 151* 210* 211* 186* 165*    Studies/Results: No results found. Medications: . sodium chloride    . sodium chloride    . sodium chloride    .  ceFAZolin (ANCEF) IV Stopped (03/13/18 2235)  . rifampin (RIFADIN) IVPB Stopped (03/13/18 2353)   .  allopurinol  100 mg Oral Daily  . amiodarone  200 mg Oral Daily  . calcitRIOL  2 mcg Oral Q T,Th,Sa-HD  . Chlorhexidine Gluconate Cloth  6 each Topical Q0600  . [START ON 03/17/2018] darbepoetin (ARANESP) injection - DIALYSIS  200 mcg Intravenous Q Thu-HD  . diclofenac sodium  2 g Topical TID  . feeding supplement (PRO-STAT SUGAR FREE 64)  30 mL Oral BID  . heparin  1,000 Units Intracatheter Once  . midodrine  5 mg Oral Q T,Th,Sa-HD  . oxybutynin  5 mg Oral BID  . pantoprazole  40 mg Oral QHS  . phytonadione  5 mg Oral Daily  . sevelamer carbonate  2,400 mg Oral TID WC  . sodium chloride flush  10-40 mL Intracatheter Q12H  . sodium chloride flush  3 mL Intravenous Q12H  . sodium chloride flush  3 mL Intravenous Q12H

## 2018-03-14 NOTE — Progress Notes (Signed)
Physical Therapy Treatment Patient Details Name: Albert Stanley MRN: [id number] DOB: 08-04-1946 Today's Date: 03/14/2018    History of Present Illness Pt. is a 72 y.o. M with significant PMH of ESRD on TTS HD, chronic hypotension on midodrine, DM type II, NICM EF 51%, chronic systolic CHF, SDH 04/6159 s/p craniotomy, right femoral DVT s/p IVC filter, urinary retention, bladder cancer status post resection, renal mass, recent hospitalization 02/03/2018-02/07/2018 for refractory hypoglycemia. Presenting with confusion and another episode of hypoglycemia.     PT Comments    Continuing work on functional mobility and activity tolerance;  Noting improvements in ability to participate, much improved sitting balance, and pt asking to get OOB to chair; 2 person assist to lateral scoot Albert Stanley to recliner -- improvement since most recent PT eval.   Follow Up Recommendations  SNF     Equipment Recommendations  None recommended by PT    Recommendations for Other Services       Precautions / Restrictions Precautions Precautions: Fall    Mobility  Bed Mobility Overal bed mobility: Needs Assistance Bed Mobility: Supine to Sit     Supine to sit: Max assist;+2 for physical assistance     General bed mobility comments: Cues for technqiue; Pt with good effort to move LEs off of bed; Required Max assist to elevate trunk to sit  Transfers Overall transfer level: Needs assistance   Transfers: Lateral/Scoot Transfers          Lateral/Scoot Transfers: +2 physical assistance;Max assist General transfer comment: Heavy assist of 2 to perform lateral scoot transfer bed to recliner (with armrest down); used bed pad and blocked bil knees for stability  Ambulation/Gait                 Stairs             Wheelchair Mobility    Modified Rankin (Stroke Patients Only)       Balance     Sitting balance-Leahy Scale: Fair                                       Cognition Arousal/Alertness: Awake/alert Behavior During Therapy: WFL for tasks assessed/performed Overall Cognitive Status: Impaired/Different from baseline Area of Impairment: Orientation;Memory;Following commands;Safety/judgement                 Orientation Level: Disoriented to;Place;Time;Situation   Memory: Decreased short-term memory Following Commands: Follows one step commands consistently;Follows one step commands with increased time Safety/Judgement: Decreased awareness of safety;Decreased awareness of deficits            Exercises      General Comments        Pertinent Vitals/Pain Pain Assessment: No/denies pain Faces Pain Scale: No hurt Pain Intervention(s): Monitored during session    Home Living                      Prior Function            PT Goals (current goals can now be found in the care plan section) Acute Rehab PT Goals Patient Stated Goal: none stated, but agreeable to getting OOB to chair PT Goal Formulation: Patient unable to participate in goal setting Time For Goal Achievement: 03/25/18 Potential to Achieve Goals: Fair Progress towards PT goals: Progressing toward goals(slowly)    Frequency    Min 2X/week      PT  Plan Current plan remains appropriate    Co-evaluation              AM-PAC PT "6 Clicks" Daily Activity  Outcome Measure  Difficulty turning over in bed (including adjusting bedclothes, sheets and blankets)?: Unable Difficulty moving from lying on back to sitting on the side of the bed? : Unable Difficulty sitting down on and standing up from a chair with arms (e.g., wheelchair, bedside commode, etc,.)?: Unable Help needed moving to and from a bed to chair (including a wheelchair)?: Total Help needed walking in hospital room?: Total Help needed climbing 3-5 steps with a railing? : Total 6 Click Score: 6    End of Session Equipment Utilized During Treatment: Oxygen Activity Tolerance:  Patient tolerated treatment well Patient left: in chair;with call bell/phone within reach;with chair alarm set Nurse Communication: Mobility status;Need for lift equipment PT Visit Diagnosis: Other abnormalities of gait and mobility (R26.89);Muscle weakness (generalized) (M62.81);Adult, failure to thrive (R62.7)     Time: 1224-8250 PT Time Calculation (min) (ACUTE ONLY): 29 min  Charges:  $Therapeutic Activity: 23-37 mins                    G Codes:       Roney Marion, PT  Acute Rehabilitation Services Pager [phone] Office [place] 03/14/2018, 4:18 PM

## 2018-03-14 NOTE — Progress Notes (Addendum)
  Speech Language Pathology Treatment: Dysphagia  Patient Details Name: Albert Stanley MRN: [id number] DOB: [birth year] Today's Date: 03/14/2018 Time: 1050-1100 SLP Time Calculation (min) (ACUTE ONLY): 10 min  Assessment / Plan / Recommendation Clinical Impression  Pt seen for assessment of diet tolerance and readiness to advance. Pt awake, confused. Required cues to close lips around straw for thin liquid trials. No overt s/s aspiration observed following multiple boluses of water. Pt declined trials of puree, solid. RN reports decreased po intake, but apparent tolerance of current diet. Lungs clear, pt afebrile. Will continue puree diet at this time, given pt refusal to try advanced consistencies. ST to continue to follow.   HPI HPI: 72 year old male admitted with AMS, fever, concern for sepsis. CT Chest showed bibasliar opacities most likely representing atelectasis although subtle bibasilar PNA coudln't be excluded. PMH: DM, ESRD, GERD, HLD, HTN      SLP Plan  Continue with current plan of care       Recommendations  Diet recommendations: Dysphagia 1 (puree);Thin liquid Liquids provided via: Straw;Cup Medication Administration: Whole meds with puree Supervision: Full supervision/cueing for compensatory strategies Compensations: Minimize environmental distractions;Slow rate;Small sips/bites Postural Changes and/or Swallow Maneuvers: Seated upright 90 degrees;Upright 30-60 min after meal                Oral Care Recommendations: Oral care before and after PO;Oral care BID Follow up Recommendations: (TBD) SLP Visit Diagnosis: Dysphagia, unspecified (R13.10) Plan: Continue with current plan of care       [place]. Quentin Ore [facility], CCC-SLP Speech Language Pathologist [phone]  Shonna Chock 03/14/2018, 11:03 AM

## 2018-03-14 NOTE — Progress Notes (Signed)
[place], MD notified regarding patients HR. MD placed order to put patient back on telemetry. Orders followed. Will continue to monitor.

## 2018-03-14 NOTE — Progress Notes (Signed)
[name]  Date of Admission:  03/08/2018             ASSESSMENT/PLAN  [name] is a 72 y/o male with previous history of ESRD found to have Group B streptococcus bacteremia with the likely source being an infected abscess of his AVG located in the left arm. He does also have a temporary dialysis catheter that is functioning sub-optimally. Both ID and Nephrology have discussed the likely poor prognosis as he would need to have the AVG and the temporary catheter removed. This is complicated by thrombocytopenia making him a poor surgical candidate. He has received platelets minimal improvement to thrombocytopenia. He remains afebrile with repeat blood cultures with no growth to date. He is more awake and alert today than he has previously been.  Family wishes to continue care at this point and will discuss the potential need for comfort care. He is currently receiving Day 7 of antimicrobial therapy with  Ancef (Day 5) and Rifampin (Day 2) and appears to be tolerating it without adverse side effects.   1. Continue cefazolin and rifampin. Treatment course to be determined by goals of care. Appreciate Palliative Care and Nephrology input. 2. Continue to monitor cultures.   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 allopurinol  100 mg Oral Daily  . amiodarone  200 mg Oral Daily  . calcitRIOL  2 mcg Oral  Q T,Th,Sa-HD  . Chlorhexidine Gluconate Cloth  6 each Topical Q0600  . [START ON 03/17/2018] darbepoetin (ARANESP) injection - DIALYSIS  200 mcg Intravenous Q Thu-HD  . diclofenac sodium  2 g Topical TID  . feeding supplement (PRO-STAT SUGAR FREE 64)  30 mL Oral BID  . heparin  1,000 Units Intracatheter Once  . midodrine  5 mg Oral Q T,Th,Sa-HD  . multivitamin  1 tablet Oral QHS  . oxybutynin  5 mg Oral BID  . pantoprazole  40 mg Oral QHS  . phytonadione  5 mg Oral Daily  . sevelamer carbonate  2,400 mg Oral TID WC  . sodium chloride flush  10-40 mL Intracatheter Q12H  . sodium chloride flush  3 mL Intravenous Q12H  . sodium chloride flush  3 mL Intravenous Q12H    SUBJECTIVE:  Afebrile overnight with no leukocytosis. Remains on Cefazolin and rifampin. Nephrology/ID spoken with family regarding plan of care and prognosis.   Continues to experience pain located in his left arm. Wanting to get the stuff off his arms and get out of bed.   Allergies  Allergen Reactions  . Meperidine Shortness Of Breath and Other (See Comments)    Increased heart rate; Reaction to demerol  . Penicillins Other (See Comments)    Tolerates cefazolin  Has patient had a PCN reaction causing immediate rash, facial/tongue/throat swelling, SOB or lightheadedness with hypotension: Yes (syncope) Has patient had a PCN reaction causing severe rash involving mucus membranes or skin necrosis: No  Has patient had a PCN reaction that required hospitalization: No (treated at MD's office) Has patient had a PCN reaction occurring within the last 10 years: No If all of the above answers are "NO", then may proceed with Cephalosporin use.  . Sulfa Antibiotics Shortness Of Breath and Other (See Comments)    Increased heart rate  . Sulfonamide Derivatives Shortness Of Breath and Other (See Comments)    TACHYCARDIA  . [place] Diarrhea, Nausea And Vomiting and Other (See Comments)    Constipation Reaction to whole milk  .  Lactose Intolerance (Gi) Diarrhea, Nausea And Vomiting and Other (See Comments)    Constipation Reaction to whole milk  . Milk-Related Compounds Diarrhea, Nausea And Vomiting and Other (See Comments)    Constipation Reaction to whole milk  . Foltx [Elfolate Plus] Other (See Comments)    UNSPECIFIED REACTION  "Pt does not recall reaction to this"  . Betadine [Povidone Iodine] Itching, Rash and Other (See Comments)    burning  . Eggs Or Egg-Derived Products Nausea And Vomiting    Reaction to egg yolks - not whites       Review of Systems: Review of Systems  Constitutional: Negative for chills, diaphoresis and fever.  Cardiovascular: Negative for chest pain and leg swelling.  Gastrointestinal: Negative for abdominal pain, constipation, diarrhea, nausea and vomiting.  Skin: Negative for rash.      OBJECTIVE: Vitals:   03/13/18 1029 03/13/18 1625 03/13/18 2031 03/14/18 0310  BP: 113/85 118/62 124/64 108/73  Pulse: 71 87 90 (!) 121  Resp: 20 16 19 18   Temp: 98 F (36.7 C) 97.9 F (36.6 C) 98 F (36.7 C) 97.7 F (36.5 C)  TempSrc: Oral Oral Oral Oral  SpO2: 99% 100% 100% 95%  Weight:   182 lb 15.7 oz (83 kg)   Height:       Body mass index is 28.66 kg/m.  Physical Exam  Constitutional: He is oriented to person, place, and time. No distress.  Eating breakfast with assistance. Seated in the bed with the head of bed elevated.   Cardiovascular: Normal rate, regular rhythm, normal heart sounds and intact distal pulses. Exam reveals no gallop and no friction rub.  No murmur heard. AV fistula with edema and tenderness. Temporary catheter without tenderness and no induration noted.   Pulmonary/Chest: Effort normal and breath sounds normal. No stridor. No respiratory distress. He has no wheezes. He has no rales. He exhibits no tenderness.  Abdominal: Soft. Bowel sounds are normal. He exhibits no distension.  Neurological: He is alert and oriented to person, place, and time.     Lab Results Lab Results  Component Value Date   WBC 6.6 03/14/2018   HGB 9.4 (L) 03/14/2018   HCT 26.4 (L) 03/14/2018   MCV 78.6 03/14/2018   PLT 49 (L) 03/14/2018    Lab Results  Component Value Date   CREATININE 6.94 (H) 04-01-18   BUN 58 (H) 2018/04/01   NA 137 01-Apr-2018   K 4.2 04/01/2018   CL 95 (L) Apr 01, 2018   CO2 28 2018-04-01    Lab Results  Component Value Date   ALT 11 (L) 03/11/2018   AST 32 03/11/2018   ALKPHOS 65 03/11/2018   BILITOT 3.1 (H) 03/11/2018     Microbiology: Recent Results (from the past 240 hour(s))  Culture, blood (routine x 2)     Status: Abnormal   Collection Time: 03/08/18  2:30 PM  Result Value Ref Range Status   Specimen Description  BLOOD RIGHT HAND  Final   Special Requests   Final    BOTTLES DRAWN AEROBIC AND ANAEROBIC Blood Culture results may not be optimal due to an inadequate volume of blood received in culture bottles Performed at [facility] [address] [place], [place] [address]    Culture  Setup Time   Final    IN BOTH AEROBIC AND ANAEROBIC BOTTLES GRAM POSITIVE COCCI Organism ID to follow CRITICAL RESULT CALLED TO, READ BACK BY AND VERIFIED WITH: L SEAY PHARMD 03/09/18 0239 JDW    Culture GROUP B STREP(S.AGALACTIAE)ISOLATED (A)  Final   Report Status 03/10/2018 FINAL  Final   Organism ID, Bacteria GROUP B STREP(S.AGALACTIAE)ISOLATED  Final      Susceptibility   Group b strep(s.agalactiae)isolated - MIC*    CLINDAMYCIN >=1 RESISTANT Resistant     AMPICILLIN <=0.25 SENSITIVE Sensitive     ERYTHROMYCIN >=8 RESISTANT Resistant     VANCOMYCIN 0.5 SENSITIVE Sensitive     CEFTRIAXONE <=0.12 SENSITIVE Sensitive     LEVOFLOXACIN 1 SENSITIVE Sensitive     * GROUP B STREP(S.AGALACTIAE)ISOLATED  Blood Culture ID Panel (Reflexed)     Status: Abnormal   Collection Time: 03/08/18  2:30 PM  Result Value Ref Range Status   Enterococcus species NOT DETECTED NOT DETECTED Final   Listeria monocytogenes NOT DETECTED  NOT DETECTED Final   Staphylococcus species NOT DETECTED NOT DETECTED Final   Staphylococcus aureus NOT DETECTED NOT DETECTED Final   Streptococcus species DETECTED (A) NOT DETECTED Final    Comment: CRITICAL RESULT CALLED TO, READ BACK BY AND VERIFIED WITH: L SEAY PHARMD 03/09/18 0239 JDW    Streptococcus agalactiae DETECTED (A) NOT DETECTED Final    Comment: CRITICAL RESULT CALLED TO, READ BACK BY AND VERIFIED WITH: L SEAY PHARMD 03/09/18 0239 JDW    Streptococcus pneumoniae NOT DETECTED NOT DETECTED Final   Streptococcus pyogenes NOT DETECTED NOT DETECTED Final   Acinetobacter baumannii NOT DETECTED NOT DETECTED Final   Enterobacteriaceae species NOT DETECTED NOT DETECTED Final   Enterobacter cloacae complex NOT DETECTED NOT DETECTED Final   Escherichia coli NOT DETECTED NOT DETECTED Final   Klebsiella oxytoca NOT DETECTED NOT DETECTED Final   Klebsiella pneumoniae NOT DETECTED NOT DETECTED Final   Proteus species NOT DETECTED NOT DETECTED Final   Serratia marcescens NOT DETECTED NOT DETECTED Final   Haemophilus influenzae NOT DETECTED NOT DETECTED Final   Neisseria meningitidis NOT DETECTED NOT DETECTED Final   Pseudomonas aeruginosa NOT DETECTED NOT DETECTED Final   Candida albicans NOT DETECTED NOT DETECTED Final   Candida glabrata NOT DETECTED NOT DETECTED Final   Candida krusei NOT DETECTED NOT DETECTED Final   Candida parapsilosis NOT DETECTED NOT DETECTED Final   Candida tropicalis NOT DETECTED NOT DETECTED Final  Culture, blood (routine x 2)     Status: None   Collection Time: 03/08/18  3:00 PM  Result Value Ref Range Status   Specimen Description BLOOD RIGHT UPPER ARM  Final   Special Requests   Final    BOTTLES DRAWN AEROBIC AND ANAEROBIC Blood Culture adequate volume   Culture   Final    NO GROWTH 5 DAYS Performed at [facility], [address] [place], [place] [address]    Report Status 03/13/2018 FINAL  Final  Culture, blood (routine x 2)     Status: None  (Preliminary result)   Collection Time: 03/11/18  7:42 AM  Result Value Ref Range Status   Specimen Description BLOOD RIGHT  ANTECUBITAL  Final   Special Requests   Final    BOTTLES DRAWN AEROBIC AND ANAEROBIC Blood Culture adequate volume   Culture   Final    NO GROWTH 2 DAYS Performed at [facility], [address], [place], [place] [address]    Report Status PENDING  Incomplete  Culture, blood (routine x 2)     Status: None (Preliminary result)   Collection Time: 03/11/18  7:42 AM  Result Value Ref Range Status   Specimen Description BLOOD RIGHT ANTECUBITAL  Final   Special Requests   Final    BOTTLES DRAWN AEROBIC AND ANAEROBIC Blood Culture adequate volume   Culture   Final    NO GROWTH 2 DAYS Performed at [facility], [place] [name] [place], [place] [address]    Report Status PENDING  Incomplete     Terri Piedra, [place] for [place] Pager  03/14/2018  9:07 AM

## 2018-03-14 NOTE — Progress Notes (Signed)
PROGRESS NOTE   Albert Stanley  ONG:[id number]    DOB: 07-14-1946    DOA: 03/08/2018  PCP: [name], MD   I have briefly reviewed patients previous medical records in [facility].  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 [name]), frequent hospitalizations (this is his sixth in the last 6 months), recently in 4/30-5/3 for hypoglycemia presented from HD center due to confusion and not acting himself, low-grade fever and admitted for sepsis due to group B strep bacteremia probably due to abscess at the site of the left upper extremity AVG but TDC could also be infected.  Nephrology, ID, palliative, vascular surgery and hematology consulted.  Overall poor short and long-term prognosis.  This is been discussed extensively by multiple providers with family and awaiting their decision regarding continued aggressive care versus comfort care.  Assessment & Plan:   Principal Problem:   Sepsis due to undetermined organism with metabolic encephalopathy ([place]) Active Problems:   Lumbar degenerative disc disease   History of DVT (deep vein thrombosis)   End-stage renal disease on hemodialysis ([name])   Dyslipidemia associated with type 2 diabetes mellitus ([place])   Diabetes mellitus with diabetic nephropathy without long-term current use of insulin ([name])   Anemia associated with chronic renal failure   Malnutrition of moderate degree   S/P craniotomy   Dysphagia   Benign essential HTN   Foley catheter problem ([name])   Thrombocytopenia ([name])   Coagulopathy ([name])   Sepsis due to group B strep (strep agalactiae) bacteremia: 1 of 2 blood  cultures confirms group B strep.  On admission, patient had indwelling Foley catheter (changed this admission on 5/30 by urology), [facility] and left upper extremity AVG.  Initially HD access did not appear infected but on 6/31, clinical exam suspicious for abscess at AVG site and likely source of his bacteremia.  He had been empirically started on IV vancomycin and cefepime.  Based on culture results changed to IV cefazolin on 5/30 and rifampin added 6/2.  TTE 5/30 does not comment about vegetations.  Multiple providers have discussed extensively with family regarding overall short and long-term prognosis and await their decision regarding continued aggressive care versus comfort care.  If family insist on continued aggressive care, vascular surgery consult for assessment and management of left upper extremity AVG and removal of TDC. Surveillance blood cultures x2 from 5/31: Negative to date.  Acute toxic metabolic encephalopathy: Likely secondary to acute infection.  CT head without acute findings.  No focal deficits.  Mental status was waxing and waning initially but over the last 2 days, has consistently improved.    Thrombocytopenia: On chart review, patient has had chronic intermittent mild thrombocytopenia in the past and coagulopathy.  He presented with platelets of 25K which  dropped to 14, INR 1.9, schistocytes noted on CBC 5/28.  I as per hematology, suspects sepsis related DIC and antibiotics as the cause of patient's thrombocytopenia and less likely from TTP.  Normal fibrinogen, mildly elevated LDH.  If platelets <20 or if bleeding, recommends transfusing 1 bag of platelets.  If bleeding, may need to consider FFP and if bleeding and low fibrinogen may also need to consider cryoprecipitate.  Thus far  has received 3 units of platelets and platelets are up to 65K on 6/1.  Also on vitamin K for coagulopathy.  Platelets down to in the mid 40s and stable over the last 24 hours.  Follow CBC in a.m.  ESRD on  TTS HD: Nephrology consulted and underwent HD 5/29 and 6/1.  Awaiting family input regarding decision to continue HD or not.  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 [name], Urology).  Consulted urology who changed Foley catheter on 5/30.  Continue monthly outpatient change by urology.  History of recurrent DVT: Status post IVC filter.  NICM/chronic systolic CHF: Chronic volume overload, managed across dialysis but no overt pulmonary edema.  Volume status better than on admission.  TTE 5/31: LVEF 25% and diffuse hypokinesis.  PVCs: Remains on amiodarone.  Chronic hypotension: Midodrine.  Blood pressures have been normal over last couple of days.  Type II DM with recurrent hypoglycemia: Has had intermittent hypoglycemia despite no hypoglycemics.  CBGs now trending up and ranging between 165- 211.  However given his propensity for hypoglycemia, continue to monitor CBGs without initiating SSI at this time.  Dysphagia: Speech therapy input appreciated and have transitioned to dysphagia 1 diet and thin liquids on 5/31.  Speech therapy recommends continued current diet.  Anemia in ESRD: Stable.  History of SDH status post craniotomy: Repeat CT head without acute findings.  Adult FTT: I discussed in detail with patient's spouse on 5/30 and outlined overall poor long-term prognosis given advanced age, multiple significant severe comorbidities, frequent hospitalizations.  Palliative care input appreciated, changed to DNR, family wished to continue to treat the treatable while hospitalized and were not ready to stop HD yet but have not discussed with nephrology.  Nephrology also discussed in detail with family on 6/2.  Isolated hyperbilirubinemia:?  Related to sepsis related DIC.  LFT shows mixed direct and indirect hyperbilirubinemia.  NSVT: Noted on 5/31.  Likely due to  cardiomyopathy.  Potassium and magnesium okay.  Intermittent NSVT.  New onset A. fib: On 6/2, telemetry showed new onset A. fib approximately 9:40 AM with rates in the 100s-110s.  Difficult situation.  Not anticoagulation candidate due to severe thrombocytopenia and coagulopathy.  Current rates are not symptomatic.  Hold rate control medications due to risk of worsening hypotension.  Check EKG to confirm.  [place] telemetry 6/3.   DVT prophylaxis: SCDs Code Status: DNR Family Communication: None at bedside today. Disposition: To be determined   Consultants:  Neurology Hematology Palliative care team for goals of care. Infectious disease. Vascular surgery  Procedures:  Hemodialysis  Antimicrobials:  IV vancomycin and cefepime, discontinued. IV cefazolin 5/30 > IV rifampin 6/2 >    Subjective: Mental status has further improved.  Sitting up in bed and eating breakfast that is being fed by nursing tech.  Alert and oriented to person and place.  States that he wants to get out of bed and does not want his feet covered with sheets.  Does not know why he came to the hospital.  ROS: Unable.  Objective:  Vitals:   03/13/18 1625 03/13/18 2031 03/14/18 0310 03/14/18 0910  BP: 118/62 124/64 108/73 111/67  Pulse: 87 90 (!) 121 83  Resp: 16 19 18 16   Temp: 97.9 F (36.6 C) 98 F (36.7 C) 97.7 F (36.5 C) 97.7 F (36.5 C)  TempSrc: Oral Oral Oral Oral  SpO2: 100% 100% 95% 100%  Weight:  83 kg (182 lb 15.7 oz)    Height:        Examination:  General exam: Elderly male, moderately built and nourished, chronically ill looking, sitting up comfortably in bed.  Has progressively improved over the last 48 hours. Respiratory system: Clear to auscultation anteriorly.  Occasional basal crackles.  No increased work of breathing.  Left TDC without acute findings.  Stable without change. Cardiovascular system: S1 & S2 heard, RRR. No JVD, murmurs, rubs, gallops or clicks.  1+ bilateral  leg edema, left upper extremity 2+ pitting edema.  Telemetry personally reviewed: A. fib with ventricular rate in the 100s, BBB morphology.  4 beat NSVT noted. Gastrointestinal system: Abdomen is nondistended, soft and nontender. No organomegaly or masses felt. Normal bowel sounds heard.  Stable Central nervous system: Alert and oriented x2.  No focal neurological deficits.  Follows instructions. Extremities: Moves all extremities symmetrically.  Left upper extremity AVG now shows new increasing swelling, and lower aspect of AVG.  Not much tenderness, warmth or erythema at this time.  Has mittens on both hands.   Skin: No rashes, lesions or ulcers Psychiatry: Judgement and insight impaired. Mood & affect cannot be assessed.    Data Reviewed: I have personally reviewed following labs and imaging studies  CBC: Recent Labs  Lab 03/08/18 1319  03/10/18 0437 03/10/18 1617 03/11/18 0649 2018-04-02 0838 03/13/18 0418 03/14/18 0802  WBC 6.8   < > 11.1* 10.0 10.1 9.3 7.3 6.6  NEUTROABS 6.1  --  8.0*  --   --   --   --   --   HGB 10.6*   < > 9.8* 9.2* 9.3* 9.0* 9.5* 9.4*  HCT 30.7*   < > 27.8* 26.1* 26.1* 25.4* 27.3* 26.4*  MCV 81.6   < > 79.9 79.6 79.6 77.4* 79.4 78.6  PLT 25*   < > 13* 22* 19* 65* 46* 49*   < > = values in this interval not displayed.   Basic Metabolic Panel: Recent Labs  Lab 03/08/18 1319 03/08/18 1339 03/09/18 0322 03/10/18 0437 03/11/18 0649 04/02/2018 0838  NA 134* 132* 135 138 137 137  K 4.4 4.3 4.3 3.8 4.1 4.2  CL 92* 95* 91* 92* 94* 95*  CO2 24  --  27 28 29 28   GLUCOSE 140* 136* 157* 47* 89 84  BUN 63* 54* 72* 36* 51* 58*  CREATININE 8.01* 7.90* 8.39* 5.36* 6.33* 6.94*  CALCIUM 9.3  --  9.1 9.0 8.8* 8.4*  MG  --   --  2.1  --   --   --   PHOS  --   --   --   --   --  6.0*   Liver Function Tests: Recent Labs  Lab 03/08/18 1319 03/10/18 0437 03/11/18 0649 04/02/2018 0838  AST 31 33 32  --   ALT 18 17 11*  --   ALKPHOS 72 58 65  --   BILITOT 2.9*  3.1* 3.1*  --   PROT 5.5* 5.2* 4.7*  --   ALBUMIN 2.3* 1.9* 1.9* 1.9*   Coagulation Profile: Recent Labs  Lab 03/08/18 1655 03/13/18 0418  INR 1.99 1.76   Cardiac Enzymes: No results for input(s): CKTOTAL, CKMB, CKMBINDEX, TROPONINI in the last 168 hours. HbA1C: No results for input(s): HGBA1C in the last 72 hours. CBG: Recent Labs  Lab 03/13/18 2035 03/13/18 2353 03/14/18 0307 03/14/18 0749 03/14/18 1157  GLUCAP 210* 211* 186* 165* 168*    Recent Results (from the past  240 hour(s))  Culture, blood (routine x 2)     Status: Abnormal   Collection Time: 03/08/18  2:30 PM  Result Value Ref Range Status   Specimen Description BLOOD RIGHT HAND  Final   Special Requests   Final    BOTTLES DRAWN AEROBIC AND ANAEROBIC Blood Culture results may not be optimal due to an inadequate volume of blood received in culture bottles Performed at [facility] [address], [place], [place] [address]    Culture  Setup Time   Final    IN BOTH AEROBIC AND ANAEROBIC BOTTLES GRAM POSITIVE COCCI Organism ID to follow CRITICAL RESULT CALLED TO, READ BACK BY AND VERIFIED WITH: L SEAY PHARMD 03/09/18 0239 JDW    Culture GROUP B STREP(S.AGALACTIAE)ISOLATED (A)  Final   Report Status 03/10/2018 FINAL  Final   Organism ID, Bacteria GROUP B STREP(S.AGALACTIAE)ISOLATED  Final      Susceptibility   Group b strep(s.agalactiae)isolated - MIC*    CLINDAMYCIN >=1 RESISTANT Resistant     AMPICILLIN <=0.25 SENSITIVE Sensitive     ERYTHROMYCIN >=8 RESISTANT Resistant     VANCOMYCIN 0.5 SENSITIVE Sensitive     CEFTRIAXONE <=0.12 SENSITIVE Sensitive     LEVOFLOXACIN 1 SENSITIVE Sensitive     * GROUP B STREP(S.AGALACTIAE)ISOLATED  Blood Culture ID Panel (Reflexed)     Status: Abnormal   Collection Time: 03/08/18  2:30 PM  Result Value Ref Range Status   Enterococcus species NOT DETECTED NOT DETECTED Final   Listeria monocytogenes NOT DETECTED NOT DETECTED Final   Staphylococcus species NOT  DETECTED NOT DETECTED Final   Staphylococcus aureus NOT DETECTED NOT DETECTED Final   Streptococcus species DETECTED (A) NOT DETECTED Final    Comment: CRITICAL RESULT CALLED TO, READ BACK BY AND VERIFIED WITH: L SEAY PHARMD 03/09/18 0239 JDW    Streptococcus agalactiae DETECTED (A) NOT DETECTED Final    Comment: CRITICAL RESULT CALLED TO, READ BACK BY AND VERIFIED WITH: L SEAY PHARMD 03/09/18 0239 JDW    Streptococcus pneumoniae NOT DETECTED NOT DETECTED Final   Streptococcus pyogenes NOT DETECTED NOT DETECTED Final   Acinetobacter baumannii NOT DETECTED NOT DETECTED Final   Enterobacteriaceae species NOT DETECTED NOT DETECTED Final   Enterobacter cloacae complex NOT DETECTED NOT DETECTED Final   Escherichia coli NOT DETECTED NOT DETECTED Final   Klebsiella oxytoca NOT DETECTED NOT DETECTED Final   Klebsiella pneumoniae NOT DETECTED NOT DETECTED Final   Proteus species NOT DETECTED NOT DETECTED Final   Serratia marcescens NOT DETECTED NOT DETECTED Final   Haemophilus influenzae NOT DETECTED NOT DETECTED Final   Neisseria meningitidis NOT DETECTED NOT DETECTED Final   Pseudomonas aeruginosa NOT DETECTED NOT DETECTED Final   Candida albicans NOT DETECTED NOT DETECTED Final   Candida glabrata NOT DETECTED NOT DETECTED Final   Candida krusei NOT DETECTED NOT DETECTED Final   Candida parapsilosis NOT DETECTED NOT DETECTED Final   Candida tropicalis NOT DETECTED NOT DETECTED Final  Culture, blood (routine x 2)     Status: None   Collection Time: 03/08/18  3:00 PM  Result Value Ref Range Status   Specimen Description BLOOD RIGHT UPPER ARM  Final   Special Requests   Final    BOTTLES DRAWN AEROBIC AND ANAEROBIC Blood Culture adequate volume   Culture   Final    NO GROWTH 5 DAYS Performed at [facility], [address], [place], [name] [address]    Report Status 03/13/2018 FINAL  Final  Culture, blood (  routine x 2)     Status: None (Preliminary result)   Collection Time:  03/11/18  7:42 AM  Result Value Ref Range Status   Specimen Description BLOOD RIGHT ANTECUBITAL  Final   Special Requests   Final    BOTTLES DRAWN AEROBIC AND ANAEROBIC Blood Culture adequate volume   Culture   Final    NO GROWTH 2 DAYS Performed at [facility], [address], [place], [place] [address]    Report Status PENDING  Incomplete  Culture, blood (routine x 2)     Status: None (Preliminary result)   Collection Time: 03/11/18  7:42 AM  Result Value Ref Range Status   Specimen Description BLOOD RIGHT ANTECUBITAL  Final   Special Requests   Final    BOTTLES DRAWN AEROBIC AND ANAEROBIC Blood Culture adequate volume   Culture   Final    NO GROWTH 2 DAYS Performed at [facility], [place] [address], [place], [place] [address]    Report Status PENDING  Incomplete         Radiology Studies: No results found.      Scheduled Meds: . allopurinol  100 mg Oral Daily  . amiodarone  200 mg Oral Daily  . calcitRIOL  2 mcg Oral Q T,Th,Sa-HD  . Chlorhexidine Gluconate Cloth  6 each Topical Q0600  . [START ON 03/17/2018] darbepoetin (ARANESP) injection - DIALYSIS  200 mcg Intravenous Q Thu-HD  . diclofenac sodium  2 g Topical TID  . feeding supplement (PRO-STAT SUGAR FREE 64)  30 mL Oral BID  . heparin  1,000 Units Intracatheter Once  . midodrine  5 mg Oral Q T,Th,Sa-HD  . multivitamin  1 tablet Oral QHS  . oxybutynin  5 mg Oral BID  . pantoprazole  40 mg Oral QHS  . sevelamer carbonate  2,400 mg Oral TID WC  . sodium chloride flush  10-40 mL Intracatheter Q12H  . sodium chloride flush  3 mL Intravenous Q12H  . sodium chloride flush  3 mL Intravenous Q12H   Continuous Infusions: . sodium chloride    .  ceFAZolin (ANCEF) IV Stopped (03/13/18 2235)  . rifampin (RIFADIN) IVPB Stopped (03/14/18 1126)     LOS: 6 days     Vernell Leep, MD, FACP, [facility]. [facility] Pager [phone]  If 7PM-7AM, please contact night-coverage [email or web address] Password  [email or web address] 03/14/2018, 2:12 PM

## 2018-03-14 NOTE — Progress Notes (Signed)
Patients HR is 120 during rest. NP with Palliative Care at bedside and made aware. Will continue to monitor.

## 2018-03-14 NOTE — Progress Notes (Signed)
Inpatient Diabetes Program Recommendations  AACE/ADA: New Consensus Statement on Inpatient Glycemic Control (2015)  Target Ranges:  Prepandial:   less than 140 mg/dL      Peak postprandial:   less than 180 mg/dL (1-2 hours)      Critically ill patients:  140 - 180 mg/dL   Lab Results  Component Value Date   GLUCAP 165 (H) 03/14/2018   HGBA1C 6.4 (H) 12/09/2016   Review of Glycemic Control  Diabetes history: DM 2 Current orders for Inpatient glycemic control: None  Inpatient Diabetes Program Recommendations:    Glucose trends elevate during the day in the 200 range. If in the plan of care, please consider a custom Novolog Correction scale starting at 150 mg/dl tid.  -Custom Novolog correction scale 0-5 units       151-200  1 unit      201-250  2 units      251-300  3 units      301-350  4 units      351-400  5 units  Thanks,  Tama Headings RN, MSN, BC-ADM, [facility] Inpatient Diabetes Coordinator Team Pager [phone] (8a-5p)

## 2018-03-14 NOTE — Progress Notes (Addendum)
Pharmacy Antibiotic Note  Albert Stanley is a 72 y.o. male admitted on 03/08/2018 with concern for sepsis and now found to have 2 of 4 blood cultures growing GPC with BCID showing Strep agalactiae. The patient does have a PCN allergy listed but has tolerated cephalosporins previously - pharmacy consulted to narrow antibiotics to Cefazolin.  The patient normally dialyzes TTS and is back on schedule however only able to dialyze at 250 BFR on 6/1.  Plan: - Continue Cefazolin 1g IV every 24 hours  - Will continue to follow HD schedule/duration, culture results, LOT - Consider changing rifampin to oral as able  Temp (24hrs), Avg:97.8 F (36.6 C), Min:97.7 F (36.5 C), Max:98 F (36.7 C)  Recent Labs  Lab 03/08/18 1339 03/08/18 1340 03/08/18 1515 03/08/18 1706 03/09/18 0322  03/10/18 0437 03/10/18 1617 03/11/18 0649 24-Mar-2018 0838 03/13/18 0418 03/14/18 0802  WBC  --   --   --   --  10.3   < > 11.1* 10.0 10.1 9.3 7.3 6.6  CREATININE 7.90*  --   --   --  8.39*  --  5.36*  --  6.33* 6.94*  --   --   LATICACIDVEN  --  4.51* 3.33* 3.40*  --   --   --   --   --   --   --   --    < > = values in this interval not displayed.    Estimated Creatinine Clearance: 9.9 mL/min (A) (by C-G formula based on SCr of 6.94 mg/dL (H)).    Allergies  Allergen Reactions  . Meperidine Shortness Of Breath and Other (See Comments)    Increased heart rate; Reaction to demerol  . Penicillins Other (See Comments)    Tolerates cefazolin  Has patient had a PCN reaction causing immediate rash, facial/tongue/throat swelling, SOB or lightheadedness with hypotension: Yes (syncope) Has patient had a PCN reaction causing severe rash involving mucus membranes or skin necrosis: No Has patient had a PCN reaction that required hospitalization: No (treated at MD's office) Has patient had a PCN reaction occurring within the last 10 years: No If all of the above answers are "NO", then may proceed with Cephalosporin  use.  . Sulfa Antibiotics Shortness Of Breath and Other (See Comments)    Increased heart rate  . Sulfonamide Derivatives Shortness Of Breath and Other (See Comments)    TACHYCARDIA  . Lac Bovis Diarrhea, Nausea And Vomiting and Other (See Comments)    Constipation Reaction to whole milk  . Lactose Intolerance (Gi) Diarrhea, Nausea And Vomiting and Other (See Comments)    Constipation Reaction to whole milk  . Milk-Related Compounds Diarrhea, Nausea And Vomiting and Other (See Comments)    Constipation Reaction to whole milk  . Foltx [Elfolate Plus] Other (See Comments)    UNSPECIFIED REACTION  "Pt does not recall reaction to this"  . Betadine [Povidone Iodine] Itching, Rash and Other (See Comments)    burning  . Eggs Or Egg-Derived Products Nausea And Vomiting    Reaction to egg yolks - not whites     Antimicrobials this admission: Vanc 5/28>> 5/29 Cefepime 5/28 x1 Cefazolin 5/29 >>  Dose adjustments this admission: None  Microbiology results: 5/28 BCx: 2/4 Grp B Strep (BCID Strep agalactiae) 5/31 BCx >> ngtd   Thank you for allowing pharmacy to be a part of this patient's care.  Renold Genta, PharmD, BCPS Clinical Pharmacist Clinical phone for 03/14/2018 until 4p is [phone] 03/14/2018 11:05 AM

## 2018-03-15 DIAGNOSIS — D689 Coagulation defect, unspecified: Secondary | ICD-10-CM

## 2018-03-15 DIAGNOSIS — Y828 Other medical devices associated with adverse incidents: Secondary | ICD-10-CM

## 2018-03-15 DIAGNOSIS — T827XXA Infection and inflammatory reaction due to other cardiac and vascular devices, implants and grafts, initial encounter: Secondary | ICD-10-CM

## 2018-03-15 LAB — RENAL FUNCTION PANEL
ANION GAP: 16 — AB (ref 5–15)
Albumin: 1.9 g/dL — ABNORMAL LOW (ref 3.5–5.0)
BUN: 66 mg/dL — ABNORMAL HIGH (ref 6–20)
CO2: 26 mmol/L (ref 22–32)
Calcium: 8.7 mg/dL — ABNORMAL LOW (ref 8.9–10.3)
Chloride: 96 mmol/L — ABNORMAL LOW (ref 101–111)
Creatinine, Ser: 6.66 mg/dL — ABNORMAL HIGH (ref 0.61–1.24)
GFR calc Af Amer: 9 mL/min — ABNORMAL LOW (ref >60)
GFR calc non Af Amer: 7 mL/min — ABNORMAL LOW (ref >60)
GLUCOSE: 113 mg/dL — AB (ref 65–99)
POTASSIUM: 4.8 mmol/L (ref 3.5–5.1)
Phosphorus: 5.7 mg/dL — ABNORMAL HIGH (ref 2.5–4.6)
Sodium: 138 mmol/L (ref 135–145)

## 2018-03-15 LAB — CBC
HEMATOCRIT: 26.1 % — AB (ref 39.0–52.0)
HEMOGLOBIN: 9.2 g/dL — AB (ref 13.0–17.0)
MCH: 27.7 pg (ref 26.0–34.0)
MCHC: 35.2 g/dL (ref 30.0–36.0)
MCV: 78.6 fL (ref 78.0–100.0)
Platelets: 68 10*3/uL — ABNORMAL LOW (ref 150–400)
RBC: 3.32 MIL/uL — ABNORMAL LOW (ref 4.22–5.81)
RDW: 20.8 % — ABNORMAL HIGH (ref 11.5–15.5)
WBC: 8.5 10*3/uL (ref 4.0–10.5)

## 2018-03-15 MED ORDER — SODIUM CHLORIDE 0.9 % IV SOLN: 100.0000 mL | INTRAVENOUS | Status: DC | PRN

## 2018-03-15 MED ORDER — HEPARIN SODIUM (PORCINE) 1000 UNIT/ML DIALYSIS
100.0000 [IU]/kg | INTRAMUSCULAR | Status: DC | PRN
  Filled 2018-03-15: qty 9

## 2018-03-15 MED ORDER — HEPARIN SODIUM (PORCINE) 1000 UNIT/ML DIALYSIS: 1000.0000 [IU] | INTRAMUSCULAR | Status: DC | PRN

## 2018-03-15 MED ORDER — MIDODRINE HCL 5 MG PO TABS
ORAL_TABLET | ORAL | Status: AC
  Filled 2018-03-15: qty 1

## 2018-03-15 MED ORDER — CHLORHEXIDINE GLUCONATE CLOTH 2 % EX PADS: 6.0000 | MEDICATED_PAD | Freq: Every day | CUTANEOUS | Status: DC

## 2018-03-15 MED ORDER — CEFAZOLIN SODIUM-DEXTROSE 1-4 GM/50ML-% IV SOLN
1.0000 g | INTRAVENOUS | Status: DC
  Administered 2018-03-15: 1 g via INTRAVENOUS
  Filled 2018-03-15 (×3): qty 50

## 2018-03-15 MED ORDER — PENTAFLUOROPROP-TETRAFLUOROETH EX AERO: 1.0000 "application " | INHALATION_SPRAY | CUTANEOUS | Status: DC | PRN

## 2018-03-15 MED ORDER — LIDOCAINE-PRILOCAINE 2.5-2.5 % EX CREA: 1.0000 "application " | TOPICAL_CREAM | CUTANEOUS | Status: DC | PRN

## 2018-03-15 MED ORDER — ALTEPLASE 2 MG IJ SOLR
2.0000 mg | Freq: Once | INTRAMUSCULAR | Status: DC | PRN
Start: 2018-03-15 — End: 2018-03-15

## 2018-03-15 MED ORDER — LIDOCAINE HCL (PF) 1 % IJ SOLN: 5.0000 mL | INTRAMUSCULAR | Status: DC | PRN

## 2018-03-15 MED ORDER — CALCITRIOL 0.5 MCG PO CAPS
ORAL_CAPSULE | ORAL | Status: AC
  Filled 2018-03-15: qty 4

## 2018-03-15 NOTE — Progress Notes (Signed)
PROGRESS NOTE   Albert Stanley  KKX:[id number]    DOB: [birth year]    DOA: 03/08/2018  PCP: [name], MD   I have briefly reviewed patients previous medical records in [facility].  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 [name]), frequent hospitalizations (this is his sixth in the last 6 months), recently in 4/30-5/3 for hypoglycemia presented from HD center due to confusion and not acting himself, low-grade fever and admitted for sepsis due to group B strep bacteremia probably due to abscess at the site of the left upper extremity AVG but TDC could also be infected.  Nephrology, ID, palliative, vascular surgery and hematology consulted.  Overall poor short and long-term prognosis.  This is been discussed extensively by multiple providers with family and awaiting their decision regarding continued aggressive care versus comfort care.  On 6/3, family had changed to comfort care with palliative care coordination, overnight again decided that he needed to get HD and at this time ongoing HD, IV antibiotics and aggressive mode of care.  Spouse plans to meet with palliative care team and nephrology 6/5 to further clarify goals of care.  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  On admission, patient had indwelling Foley catheter (changed this admission on 5/30 by urology), [facility] and left upper extremity AVG.  Initially HD access did not appear infected but on 6/31, clinical exam suspicious for abscess at AVG site and likely source of his bacteremia.  He had been empirically started on IV vancomycin and cefepime.  Based on culture results changed to IV cefazolin on 5/30 and rifampin added 6/2.  TTE 5/30 does not comment about vegetations.  Multiple providers have discussed extensively with family regarding overall short and long-term prognosis and await their decision regarding continued aggressive care versus comfort care.  Surveillance blood cultures x2 from 5/31: Negative to date.  Patient was briefly made comfort care on 6/3 only to be reverted overnight to continue HD, antibiotics and aggressive mode of care.  Discussed with palliative care team, plan is for spouse to meet with palliative care team and nephrology on 6/5 to clarify goals of care.  ID continues to follow.  Acute toxic metabolic encephalopathy: Likely secondary to acute infection.  CT head without acute findings.  No focal deficits.  Mental status continues to wax and  wane.  Felt that it was progressively getting better yesterday but again very confused this morning.  Thrombocytopenia: On chart review, patient has had chronic intermittent mild thrombocytopenia in the past and coagulopathy.  He presented with platelets of 25K which  dropped to  14, INR 1.9, schistocytes noted on CBC 5/28.  I as per hematology, suspects sepsis related DIC and antibiotics as the cause of patient's thrombocytopenia and less likely from TTP.  Normal fibrinogen, mildly elevated LDH.  If platelets <20 or if bleeding, recommends transfusing 1 bag of platelets.  If bleeding, may need to consider FFP and if bleeding and low fibrinogen may also need to consider cryoprecipitate.  Thus far has received 3 units of platelets and platelets are up to 65K on 6/1.  Also on vitamin K for coagulopathy.  Platelets have improved to 68.  ESRD on TTS HD: Nephrology consulted.  Underwent dialysis via [facility] 6/4.  Discussed with Dr. [name].  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 [name], Urology).  Consulted urology who changed Foley catheter on 5/30.  After patient was briefly made comfort care on 6/3, Foley catheter was removed (should have been left in place for comfort given difficulty placing it).  Discussed with RN to perform bladder scan PRN and if develops recurrent urinary retention, RN to attempt to place 16-gauge Foley catheter and if difficult, need to reconsult urology.  History of recurrent DVT: Status post IVC filter.  NICM/chronic systolic CHF: Chronic volume overload, managed across dialysis but no overt pulmonary edema.  Volume status better than on admission.  TTE 5/31: LVEF 25% and diffuse hypokinesis.  PVCs: Remains on amiodarone.  Chronic hypotension: Midodrine.  Blood pressures have been normal over last couple of days.  Type II DM with recurrent hypoglycemia: Has had intermittent hypoglycemia despite no hypoglycemics.  CBGs now trending up but mostly less than 200.  Given his propensity for hypoglycemia, continue to monitor CBGs without initiating SSI at this time.  Dysphagia: Speech therapy input appreciated and have transitioned  to dysphagia 1 diet and thin liquids on 5/31.  Speech therapy recommends continued current diet.  Anemia in ESRD: Stable.  History of SDH status post craniotomy: Repeat CT head without acute findings.  Adult FTT: Overall poor short and long-term prognosis given advanced age, multiple significant severe comorbidities, frequent hospitalizations and current acute illness with bacteremia due to infected AVG and poor surgical candidate.  Palliative care team continues to assist with goals of care with family.  DNR established.  Isolated hyperbilirubinemia:?  Related to sepsis related DIC.  LFT shows mixed direct and indirect hyperbilirubinemia.  NSVT: Noted on 5/31.  Likely due to cardiomyopathy.  Potassium and magnesium okay.  Intermittent NSVT.  New onset A. fib: On 6/2, telemetry showed new onset A. fib approximately 9:40 AM with rates in the 100s-110s.  Difficult situation.  Not anticoagulation candidate due to severe thrombocytopenia and coagulopathy.  Current rates are not symptomatic.  Hold rate control medications due to risk of worsening hypotension.  Remains on telemetry.  DVT prophylaxis: SCDs Code Status: DNR Family Communication: None at bedside today. Disposition: To be determined   Consultants:  Neurology Hematology Palliative care team for goals of care. Infectious disease. Vascular surgery  Procedures:  Hemodialysis  Antimicrobials:  IV vancomycin and cefepime, discontinued. IV cefazolin 5/30 > IV rifampin 6/2 >    Subjective: Mental status worse today.  Much more confused.  Does not remember seeing me yesterday although he appeared reasonably coherent yesterday.  ROS: Unable.  Objective:  Vitals:   03/15/18 1430 03/15/18 1500 03/15/18 1530 03/15/18 1549  BP: 126/64 (!) 113/54 (!) 125/54 (!) 136/59  Pulse: 66 85 73 89  Resp:    17  Temp:    97.6 F (36.4 C)  TempSrc:    Oral  SpO2:    97%  Weight:    79.5 kg (175 lb 4.3 oz)  Height:         Examination:  General exam: Elderly male, moderately built and nourished, chronically ill looking, sitting up comfortably in bed.  Worse again this morning. Respiratory system: Clear to auscultation anteriorly.  Occasional basal crackles.  No increased work of breathing.  Left TDC without acute findings.  Stable Cardiovascular system: S1 & S2 heard, RRR. No JVD, murmurs, rubs, gallops or clicks.  1+ bilateral leg edema, left upper extremity 1+ pitting edema.  Telemetry personally reviewed: A. fib with ventricular rate mostly in the 110s-120s.  NSVT. Gastrointestinal system: Abdomen is nondistended, soft and nontender. No organomegaly or masses felt. Normal bowel sounds heard.  Stable Central nervous system: Alert and oriented x1.  No focal neurological deficits.  Does not follow instructions well today. Extremities: Moves all extremities symmetrically.  Left upper extremity AVG now shows new increasing swelling, and lower aspect of AVG.  Not much tenderness, warmth or erythema at this time.  Not much change since yesterday. Skin: No rashes, lesions or ulcers Psychiatry: Judgement and insight impaired. Mood & affect cannot be assessed.    Data Reviewed: I have personally reviewed following labs and imaging studies  CBC: Recent Labs  Lab 03/10/18 0437  03/11/18 0649 03-18-2018 0838 03/13/18 0418 03/14/18 0802 03/15/18 1244  WBC 11.1*   < > 10.1 9.3 7.3 6.6 8.5  NEUTROABS 8.0*  --   --   --   --   --   --   HGB 9.8*   < > 9.3* 9.0* 9.5* 9.4* 9.2*  HCT 27.8*   < > 26.1* 25.4* 27.3* 26.4* 26.1*  MCV 79.9   < > 79.6 77.4* 79.4 78.6 78.6  PLT 13*   < > 19* 65* 46* 49* 68*   < > = values in this interval not displayed.   Basic Metabolic Panel: Recent Labs  Lab 03/09/18 0322 03/10/18 0437 03/11/18 0649 03-18-2018 0838 03/15/18 1244  NA 135 138 137 137 138  K 4.3 3.8 4.1 4.2 4.8  CL 91* 92* 94* 95* 96*  CO2 27 28 29 28 26   GLUCOSE 157* 47* 89 84 113*  BUN 72* 36* 51* 58* 66*   CREATININE 8.39* 5.36* 6.33* 6.94* 6.66*  CALCIUM 9.1 9.0 8.8* 8.4* 8.7*  MG 2.1  --   --   --   --   PHOS  --   --   --  6.0* 5.7*   Liver Function Tests: Recent Labs  Lab 03/10/18 0437 03/11/18 0649 2018/03/18 0838 03/15/18 1244  AST 33 32  --   --   ALT 17 11*  --   --   ALKPHOS 58 65  --   --   BILITOT 3.1* 3.1*  --   --   PROT 5.2* 4.7*  --   --   ALBUMIN 1.9* 1.9* 1.9* 1.9*   Coagulation Profile: Recent Labs  Lab 03/13/18 0418  INR 1.76   CBG: Recent Labs  Lab 03/14/18 0307 03/14/18 0749 03/14/18 1157 03/14/18 1551  03/14/18 2045  GLUCAP 186* 165* 168* 161* 147*    Recent Results (from the past 240 hour(s))  Culture, blood (routine x 2)     Status: Abnormal   Collection Time: 03/08/18  2:30 PM  Result Value Ref Range Status   Specimen Description BLOOD RIGHT HAND  Final   Special Requests   Final    BOTTLES DRAWN AEROBIC AND ANAEROBIC Blood Culture results may not be optimal due to an inadequate volume of blood received in culture bottles Performed at [facility], [place] [address] [place], [place] [address]    Culture  Setup Time   Final    IN BOTH AEROBIC AND ANAEROBIC BOTTLES GRAM POSITIVE COCCI Organism ID to follow CRITICAL RESULT CALLED TO, READ BACK BY AND VERIFIED WITH: L SEAY PHARMD 03/09/18 0239 JDW    Culture GROUP B STREP(S.AGALACTIAE)ISOLATED (A)  Final   Report Status 03/10/2018 FINAL  Final   Organism ID, Bacteria GROUP B STREP(S.AGALACTIAE)ISOLATED  Final      Susceptibility   Group b strep(s.agalactiae)isolated - MIC*    CLINDAMYCIN >=1 RESISTANT Resistant     AMPICILLIN <=0.25 SENSITIVE Sensitive     ERYTHROMYCIN >=8 RESISTANT Resistant     VANCOMYCIN 0.5 SENSITIVE Sensitive     CEFTRIAXONE <=0.12 SENSITIVE Sensitive     LEVOFLOXACIN 1 SENSITIVE Sensitive     * GROUP B STREP(S.AGALACTIAE)ISOLATED  Blood Culture ID Panel (Reflexed)     Status: Abnormal   Collection Time: 03/08/18  2:30 PM  Result Value Ref Range Status    Enterococcus species NOT DETECTED NOT DETECTED Final   Listeria monocytogenes NOT DETECTED NOT DETECTED Final   Staphylococcus species NOT DETECTED NOT DETECTED Final   Staphylococcus aureus NOT DETECTED NOT DETECTED Final   Streptococcus species DETECTED (A) NOT DETECTED Final    Comment: CRITICAL RESULT CALLED TO, READ BACK BY AND VERIFIED WITH: L SEAY PHARMD 03/09/18 0239 JDW    Streptococcus agalactiae DETECTED (A) NOT DETECTED Final    Comment: CRITICAL RESULT CALLED TO, READ BACK BY AND VERIFIED WITH: L SEAY PHARMD 03/09/18 0239 JDW    Streptococcus pneumoniae NOT DETECTED NOT DETECTED Final   Streptococcus pyogenes NOT DETECTED NOT DETECTED Final   Acinetobacter baumannii NOT DETECTED NOT DETECTED Final   Enterobacteriaceae species NOT DETECTED NOT DETECTED Final   Enterobacter cloacae complex NOT DETECTED NOT DETECTED Final   Escherichia coli NOT DETECTED NOT DETECTED Final   Klebsiella oxytoca NOT DETECTED NOT DETECTED Final   Klebsiella pneumoniae NOT DETECTED NOT DETECTED Final   Proteus species NOT DETECTED NOT DETECTED Final   Serratia marcescens NOT DETECTED NOT DETECTED Final   Haemophilus influenzae NOT DETECTED NOT DETECTED Final   Neisseria meningitidis NOT DETECTED NOT DETECTED Final   Pseudomonas aeruginosa NOT DETECTED NOT DETECTED Final   Candida albicans NOT DETECTED NOT DETECTED Final   Candida glabrata NOT DETECTED NOT DETECTED Final   Candida krusei NOT DETECTED NOT DETECTED Final   Candida parapsilosis NOT DETECTED NOT DETECTED Final   Candida tropicalis NOT DETECTED NOT DETECTED Final  Culture, blood (routine x 2)     Status: None   Collection Time: 03/08/18  3:00 PM  Result Value Ref Range Status   Specimen Description BLOOD RIGHT UPPER ARM  Final   Special Requests   Final    BOTTLES DRAWN AEROBIC AND ANAEROBIC Blood Culture adequate volume   Culture   Final    NO GROWTH 5 DAYS Performed at [facility], [address] [place], [place]  [address]    Report Status 03/13/2018 FINAL  Final  Culture, blood (routine x 2)     Status: None (Preliminary result)   Collection Time: 03/11/18  7:42 AM  Result Value Ref Range Status   Specimen Description BLOOD RIGHT ANTECUBITAL  Final   Special Requests   Final    BOTTLES DRAWN AEROBIC AND ANAEROBIC Blood Culture adequate volume   Culture   Final    NO GROWTH 4 DAYS Performed at [facility], [place] [address], [place], [place] [address]    Report Status PENDING  Incomplete  Culture, blood (routine x 2)     Status: None (Preliminary result)   Collection Time: 03/11/18  7:42 AM  Result Value Ref Range Status   Specimen Description BLOOD RIGHT ANTECUBITAL  Final   Special Requests   Final    BOTTLES DRAWN AEROBIC AND ANAEROBIC Blood Culture adequate volume   Culture   Final    NO GROWTH 4 DAYS Performed at [facility], [place] [address], [place], [place] [address]    Report Status PENDING  Incomplete         Radiology Studies: No results found.      Scheduled Meds: . allopurinol  100 mg Oral Daily  . amiodarone  200 mg Oral Daily  . calcitRIOL  2 mcg Oral Q T,Th,Sa-HD  . Chlorhexidine Gluconate Cloth  6 each Topical Q0600  . Chlorhexidine Gluconate Cloth  6 each Topical Q0600  . diclofenac sodium  2 g Topical TID  . midodrine  5 mg Oral Q T,Th,Sa-HD  . oxybutynin  5 mg Oral BID   Continuous Infusions: .  ceFAZolin (ANCEF) IV 1 g (03/15/18 1509)     LOS: 7 days     Vernell Leep, MD, FACP, [facility]. [facility] Pager [phone]  If 7PM-7AM, please contact night-coverage [email or web address] Password [email or web address] 03/15/2018, 5:00 PM

## 2018-03-15 NOTE — Progress Notes (Addendum)
[facility] Progress Note   Dialysis Orders:  Assessment/Plan: 1. ESRD- TTS - HD Sat used Perm cath as unable to stick AVG due to large mass/-not drawing labs 2 Group B strep bacteremia- ID seeing /on Ancef /and Rifampin now abscess on AV graft LUA over antecubital wound site/ Poor surgical candidate at this point given ams / sepsis and overall FTT.  3. Anemia- hgb 9.4 - anemia meds stopped; not drawing labs -  4. Secondary hyperparathyroidism-related meds stopped due to decision to stop HD 6/3 5.HTN/volume-  ^^^ volume BP ok - lowish at time s with intermittent tachycardia 6. Nutrition- alb 1.9 - prostat /; pureed diet w supervision ST guidelines- poor intake  7. AMS -secondary to bacteremia/ sepsis- clearer today 8.  FTT - palliative care consulting/ yesterday wife and patient had decided to stop dialysis then wife called inpatient HD last night and changed her mind.  Today, patient sill sounds like he does not wish to continue dialysis but doesn't fully understand the complexities of his situation - need more palliative care conversations -  9.Thrombocytopenia -s/p plt transfusion 5/30 and 5/31 -  10. DM - high risk for hypoglycemia due to infection/poor intakes 11.Hx SDH -11/2017- has been residing in SNF prior to admission 12. DNR Apparently family has rescinded the no HD statement.  He is aggitated, confused, tremulous. Very high risk to cont HD.  Outlook extremely grim. Will try HD but may not be able to do procedure.  Needs clarification of goals .   Myriam Jacobson, PA-C [facility] Beeper [phone] 03/15/2018,9:29 AM  LOS: 7 days   Subjective:   There are all these people talking to me.  Still ok with stopping dialysis   Objective Vitals:   03/14/18 0310 03/14/18 0910 03/14/18 1534 03/14/18 2042  BP: 108/73 111/67 100/74 (!) 97/55  Pulse: (!) 121 83 (!) 120 83  Resp: 18 16  12   Temp: 97.7 F (36.5 C) 97.7 F (36.5 C)  97.6  F (36.4 C)  TempSrc: Oral Oral  Oral  SpO2: 95% 100% 100% 100%  Weight:      Height:       Physical Exam General: awake, pleasant, oriented to person, place Aggitated confused now , delusional, now not oriented to time or status Heart: RRR Gr 2/6 M Lungs: dim BS Abdomen: obese Liver down 6 cm Extremities: bilateral edema 1+, mittens off  Dialysis Access: left upper AVF + bruit Erythema, swelling at antecubital area over aVG   Additional Objective Labs: Basic Metabolic Panel: Recent Labs  Lab 03/10/18 0437 03/11/18 0649 2018/03/28 0838  NA 138 137 137  K 3.8 4.1 4.2  CL 92* 94* 95*  CO2 28 29 28   GLUCOSE 47* 89 84  BUN 36* 51* 58*  CREATININE 5.36* 6.33* 6.94*  CALCIUM 9.0 8.8* 8.4*  PHOS  --   --  6.0*   Liver Function Tests: Recent Labs  Lab 03/08/18 1319 03/10/18 0437 03/11/18 0649 28-Mar-2018 0838  AST 31 33 32  --   ALT 18 17 11*  --   ALKPHOS 72 58 65  --   BILITOT 2.9* 3.1* 3.1*  --   PROT 5.5* 5.2* 4.7*  --   ALBUMIN 2.3* 1.9* 1.9* 1.9*   No results for input(s): LIPASE, AMYLASE in the last 168 hours. CBC: Recent Labs  Lab 03/08/18 1319  03/10/18 0437 03/10/18 1617 03/11/18 0649 03/28/18 0838 03/13/18 0418 03/14/18 0802  WBC 6.8   < > 11.1*  10.0 10.1 9.3 7.3 6.6  NEUTROABS 6.1  --  8.0*  --   --   --   --   --   HGB 10.6*   < > 9.8* 9.2* 9.3* 9.0* 9.5* 9.4*  HCT 30.7*   < > 27.8* 26.1* 26.1* 25.4* 27.3* 26.4*  MCV 81.6   < > 79.9 79.6 79.6 77.4* 79.4 78.6  PLT 25*   < > 13* 22* 19* 65* 46* 49*   < > = values in this interval not displayed.   Blood Culture    Component Value Date/Time   SDES BLOOD RIGHT ANTECUBITAL 03/11/2018 0742   SDES BLOOD RIGHT ANTECUBITAL 03/11/2018 0742   SPECREQUEST  03/11/2018 0742    BOTTLES DRAWN AEROBIC AND ANAEROBIC Blood Culture adequate volume   SPECREQUEST  03/11/2018 0742    BOTTLES DRAWN AEROBIC AND ANAEROBIC Blood Culture adequate volume   CULT  03/11/2018 0742    NO GROWTH 3 DAYS Performed at [facility], [place] [address], [place], [place] [address]    CULT  03/11/2018 0742    NO GROWTH 3 DAYS Performed at [facility], [place] [address], [place], [place] [address]    REPTSTATUS PENDING 03/11/2018 0742   REPTSTATUS PENDING 03/11/2018 0742    Cardiac Enzymes: No results for input(s): CKTOTAL, CKMB, CKMBINDEX, TROPONINI in the last 168 hours. CBG: Recent Labs  Lab 03/14/18 0307 03/14/18 0749 03/14/18 1157 03/14/18 1551 03/14/18 2045  GLUCAP 186* 165* 168* 161* 147*   Iron Studies: No results for input(s): IRON, TIBC, TRANSFERRIN, FERRITIN in the last 72 hours. Lab Results  Component Value Date   INR 1.76 03/13/2018   INR 1.99 03/08/2018   INR 1.59 01/18/2018   Studies/Results: No results found. Medications: .  ceFAZolin (ANCEF) IV    . rifampin (RIFADIN) IVPB Stopped (03/15/18 0201)   . allopurinol  100 mg Oral Daily  . amiodarone  200 mg Oral Daily  . calcitRIOL  2 mcg Oral Q T,Th,Sa-HD  . Chlorhexidine Gluconate Cloth  6 each Topical Q0600  . diclofenac sodium  2 g Topical TID  . midodrine  5 mg Oral Q T,Th,Sa-HD  . oxybutynin  5 mg Oral BID

## 2018-03-15 NOTE — Progress Notes (Signed)
Daily Progress Note   Patient Name: Albert Stanley       Date: 03/15/2018 DOB: 04-16-1946  Age: 72 y.o. MRN#: [id number] Attending Physician: Modena Jansky, MD Primary Care Physician: [name], MD Admit Date: 03/08/2018  Reason for Consultation/Follow-up: Establishing goals of care and Terminal Care  Subjective/GOC: Patient in dialysis during my visit.   Follow-up with wife, Albert Stanley. She tells me that last night she further discussed dialysis with him and that "yes" he wanted dialysis today. Albert Stanley struggles with making this decision. She tells me she cannot make this decision and becomes anxious with multiple conversations with him regarding his wishes.  She asks where to go from here. I again discussed diagnoses, interventions, and underlying co-morbidities. Discussed poor prognosis secondary to blood stream infection that we cannot fix (infected AVG/TDC) because he is a high risk surgical candidate due to co-morbidities and thrombocytopenia. Explained high risk of further sepsis leading to decline because we cannot rid the body of the source of infection, despite aggressive antibiotics.   Albert Stanley understands he is not a surgical candidate. She verbalizes to me that she knows he "will never get better" from the infection. Although knowing this, Albert Stanley again speaks of this being "in God's hands" and wanting "nature to take its course." She is unable to make the decision to stop dialysis. She wishes to continue antibiotics and attempt dialysis until as long as medically indicated. I offered a meeting with nephrology and I to help with goals moving forward. Albert Stanley speaks of being at peace if the nephrologist tells her that dialysis should no longer be continued. She wishes for nephrology to have a  conversation with her husband, in hopes of him having a moment of lucidity.  She also verbalizes that she does not want him to "suffer." She is agreeable with continuing prn medications to ensure comfort and relief from suffering. She speaks of not wanting this to be prolonged...but again not being able to make decision to discontinue dialysis. She confirms his wishes against the heroic measure of resuscitation at EOL.   Emotional/spiritual support provided. Therapeutic listening.   Length of Stay: 7  Current Medications: Scheduled Meds:  . allopurinol  100 mg Oral Daily  . amiodarone  200 mg Oral Daily  . calcitRIOL  2 mcg Oral Q T,Th,Sa-HD  .  Chlorhexidine Gluconate Cloth  6 each Topical Q0600  . Chlorhexidine Gluconate Cloth  6 each Topical Q0600  . diclofenac sodium  2 g Topical TID  . midodrine  5 mg Oral Q T,Th,Sa-HD  . oxybutynin  5 mg Oral BID    Continuous Infusions: . sodium chloride    . sodium chloride    .  ceFAZolin (ANCEF) IV     Physical exam: In dialysis during my visit.  PRN Meds: sodium chloride, sodium chloride, acetaminophen, alteplase, Chlorhexidine Gluconate Cloth, glycopyrrolate, heparin, heparin, HYDROmorphone (DILAUDID) injection, lidocaine (PF), lidocaine-prilocaine, LORazepam, ondansetron **OR** ondansetron (ZOFRAN) IV, pentafluoroprop-tetrafluoroeth, sodium chloride flush           Vital Signs: BP 100/74 (BP Location: Right Arm)   Pulse 68   Temp 97.8 F (36.6 C) (Oral)   Resp 18   Ht 5\' 7"  (1.702 m)   Wt 83 kg (182 lb 15.7 oz)   SpO2 96%   BMI 28.66 kg/m  SpO2: SpO2: 96 % O2 Device: O2 Device: Nasal Cannula O2 Flow Rate: O2 Flow Rate (L/min): 3 L/min  Intake/output summary:   Intake/Output Summary (Last 24 hours) at 03/15/2018 1247 Last data filed at 03/15/2018 1000 Gross per 24 hour  Intake 400 ml  Output 0 ml  Net 400 ml   LBM: Last BM Date: 03/15/18 Baseline Weight: Weight: 89 kg (196 lb 3.4 oz) Most recent weight: Weight: 83 kg  (182 lb 15.7 oz)       Palliative Assessment/Data: PPS 30%     Patient Active Problem List   Diagnosis Date Noted  . Palliative care by specialist   . Right leg pain   . Bacteremia   . Sepsis due to group B Streptococcus ([place])   . Thrombocytopenia ([place])   . Coagulopathy ([place])   . Sepsis due to undetermined organism with metabolic encephalopathy ([place]) 03/08/2018  . Benign fibroma of prostate 03/08/2018  . Cardiac arrhythmia 03/08/2018  . Chronic pain 03/08/2018  . Deep vein thrombosis (DVT) ([place]) 03/08/2018  . Gout 03/08/2018  . HLD (hyperlipidemia) 03/08/2018  . LBP (low back pain) 03/08/2018  . Malignant neoplasm of urinary bladder ([place]) 03/08/2018  . Foley catheter problem ([place]) 03/08/2018  . Pressure injury of skin 02/09/2018  . Hypoglycemia secondary to sulfonylurea 02/04/2018  . Hypoglycemia 02/03/2018  . Hematuria 01/06/2018  . Labile blood pressure   . Essential hypertension   . Acute on chronic diastolic heart failure ([place])   . Type 2 diabetes mellitus with peripheral neuropathy ([name])   . Acute deep vein thrombosis (DVT) of femoral vein of right lower extremity ([place])   . Traumatic subdural hematoma ([place]) 12/16/2017  . ESRD (end stage renal disease) ([place])   . ESRD (end stage renal disease) on dialysis ([place])   . Dysphagia   . Acute blood loss anemia   . Anemia of chronic disease   . Chronic diastolic congestive heart failure ([place])   . Diabetes mellitus type 2 in nonobese ([name])   . Benign essential HTN   . Nonischemic cardiomyopathy ([place])   . Transaminitis   . Goals of care, counseling/discussion   . Endotracheally intubated   . Ventilator dependent ([place])   . Chronic renal impairment   . Chronic in-center hemodialysis status ([place])   . S/P craniotomy 12/05/2017  . Hyperkalemia 12/02/2017  . Malnutrition of moderate degree 12/18/2016  . PVC's (premature ventricular contractions)   . Anemia associated with chronic renal failure   . End-stage renal disease on  hemodialysis (  [place]) 12/09/2016  . Dyslipidemia associated with type 2 diabetes mellitus ([place]) 12/09/2016  . Acute on chronic systolic and diastolic heart failure, NYHA class 1 ([place]) 12/09/2016  . Diabetes mellitus with diabetic nephropathy without long-term current use of insulin ([place]) 12/09/2016  . Type 2 diabetes mellitus ([place]) 12/11/2015  . Fluid overload 12/06/2015  . History of DVT (deep vein thrombosis) 12/04/2015  . Renal hematoma 12/04/2015  . H/O: gout 11/16/2013  . Trochanteric bursitis 10/17/2013  . 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 Lumbar degenerative disc disease 07/17/2013    Palliative Care Assessment & Plan   Patient Profile: 72 y.o. male admitted on 03/08/2018 from [facility] with increased confusion. He has a past medical history significant for ESRD on TTS HD, chronic hypotension on midodrine, DM type II, NICM EF 67%, chronic systolic CHF, SDH 03/1949 status post craniotomy, right femoral DVT status post IVC filter, urinary retention, bladder cancer status post resection, renal mass, recent hospitalization 02/03/2018-02/07/2018 for refractory hypoglycemia. Six admissions in the last six months. This admission, patient septic secondary to group B strep bacteremia likely due to abscess at left upper extremity AVG or TDC infection. ID following. Poor prognosis. Palliative medicine consultation for goals of care.   Assessment: Sepsis Group B strep bacteremia ESRD on hemodialysis Thrombocytopenia Acute metabolic encephalopathy Chronic urinary retention s/p bladder cancer/resection Adult failure to thrive Hx of recurrent DVT Hx of SDH s/p craniotomy  Recommendations/Plan:  DNR/DNI  Following palliative conversation yesterday, the patient told his wife "yes" to dialysis today. Patient receiving HD during my visit.   Wife has a good understanding of diagnoses, interventions and that he is not a surgical candidate, and poor prognosis. Wife struggles with making the decision to  discontinue dialysis. She tells me she will not make the decision to stop dialysis but will be at peace if nephrology verbalizes medical indications against further dialysis.   Follow-up meeting tomorrow, 6/5 at 8:30am with wife and Dr. [name].   Wife agreeable to continue comfort medications prn. She does not want him to "suffer."   Code Status: DNR/DNI   Code Status Orders  (From admission, onward)        Start     Ordered   03/08/18 1615  Do not attempt resuscitation (DNR)  Continuous    Question Answer Comment  In the event of cardiac or respiratory ARREST Do not call a "code blue"   In the event of cardiac or respiratory ARREST Do not perform Intubation, CPR, defibrillation or ACLS   In the event of cardiac or respiratory ARREST Use medication by any route, position, wound care, and other measures to relive pain and suffering. May use oxygen, suction and manual treatment of airway obstruction as needed for comfort.      03/08/18 1620    Code Status History    Date Active Date Inactive Code Status Order ID Comments User Context   02/08/2018 1753 02/11/2018 2021 Full Code [id number]  Elwyn Reach, MD Inpatient   02/03/2018 1331 02/07/2018 1853 Full Code [id number]  Radene Gunning, NP ED   12/16/2017 1805 01/05/2018 1630 Full Code [id number]  Cathlyn Parsons, PA-C Inpatient   12/16/2017 1805 12/16/2017 1805 Full Code [id number]  Cathlyn Parsons, PA-C Inpatient   12/05/2017 1933 12/16/2017 1755 Full Code [id number]  Omar Person, NP Inpatient   12/09/2016 2020 12/18/2016 1853 Full Code [id number]  Theodis Blaze, MD Inpatient   06/25/2016 1139 06/25/2016 1531 Full Code [id number]  Serafina Mitchell,  MD Inpatient   06/09/2016 1002 06/09/2016 1726 Full Code [id number]  Serafina Mitchell, MD Inpatient   02/28/2016 1239 02/28/2016 1926 Full Code [id number]  Larey Dresser, MD Inpatient   12/04/2015 1824 12/18/2015 2208 Full Code [id number]  Bonnielee Haff, MD Inpatient    Advance Directive  Documentation     Most Recent Value  Type of Advance Directive  Out of facility DNR (pink MOST or yellow form)  Pre-existing out of facility DNR order (yellow form or pink MOST form)  Yellow form placed in chart (order not valid for inpatient use)  "MOST" Form in Place?  -       Prognosis:   Unable to determine: poor prognosis secondary ESRD and discontinuation of hemodialysis and with bacteremia secondary to ? AVG/TDC infection.   Discharge Planning:  To Be Determined  Care plan was discussed with wife, RN, Dr. Algis Liming, Dr. [name]  Thank you for allowing the Palliative Medicine Team to assist in the care of this patient.   Time In: 1200 Time Out: 1250 Total Time 50 Prolonged Time Billed no      Greater than 50%  of this time was spent counseling and coordinating care related to the above assessment and plan.  Ihor Dow, FNP-C Palliative Medicine Team  Phone: [phone] Fax: [phone]  Please contact Palliative Medicine Team phone at [phone] for questions and concerns.

## 2018-03-15 NOTE — Progress Notes (Signed)
    [facility]   Reason for visit: Follow up on infected AVF  Interval History: Deciding between doing dialysis and not.   Physical Exam: Constitutional:  Vitals:   03/15/18 1008 03/15/18 1115  BP: 100/74 120/65  Pulse: 68 (!) 109  Resp: 18 17  Temp: 97.8 F (36.6 C) 97.9 F (36.6 C)  SpO2: 96% 96%   patient appears in NAD  Impression: infected AVG.  Group B Strep.    Plan: 1.  Continue cefazolin with dialysis if continued care desired.   Will continue to monitor.

## 2018-03-15 NOTE — Procedures (Signed)
I was present at this session.  I have reviewed the session itself and made appropriate changes.  HD via. L IJ PC flow 305,  bp low 100s. Confused. He is not sure what he is doing here.  Jeneen Rinks [employer] 6/4/20192:16 PM

## 2018-03-15 NOTE — Progress Notes (Signed)
03/14/18 2015 Called into patient room to speak to wife. Wife stated that patient stated that he wants to continue with his hemodialysis tomorrow 03/15/18. Stated that he still wanted DNR status. Telephone call to hemodialysis to advise of patient decision. Message sent to on call to advise of situation. Orders received. Telemetry applied. Patient too lethargic to administer PO medications at this time. Will continue to monitor. Bartholomew Crews, RN

## 2018-03-16 DIAGNOSIS — A401 Sepsis due to streptococcus, group B: Principal | ICD-10-CM

## 2018-03-16 DIAGNOSIS — G9341 Metabolic encephalopathy: Secondary | ICD-10-CM

## 2018-03-16 DIAGNOSIS — Z515 Encounter for palliative care: Secondary | ICD-10-CM

## 2018-03-16 DIAGNOSIS — R6520 Severe sepsis without septic shock: Secondary | ICD-10-CM

## 2018-03-16 LAB — CULTURE, BLOOD (ROUTINE X 2)
CULTURE: NO GROWTH
Culture: NO GROWTH
SPECIAL REQUESTS: ADEQUATE
Special Requests: ADEQUATE

## 2018-03-16 MED ORDER — HYDROMORPHONE HCL 1 MG/ML IJ SOLN
0.5000 mg | INTRAMUSCULAR | Status: DC | PRN
  Administered 2018-03-16 (×3): 0.5 mg via INTRAVENOUS
  Filled 2018-03-16 (×5): qty 1

## 2018-03-16 NOTE — Consult Note (Signed)
[facility]   Received request from [place] for family interest in [facility]. Chart reviewed. Eligibility has been confirmed. Attempted visit, no family present. Left message with spouse acknowledging referral and inviting a call back. [place] room is not available today. Hospital team will follow up with CSW/family tomorrow morning with updates.   Thank you,  Erling Conte, LCSW [phone]

## 2018-03-16 NOTE — Progress Notes (Addendum)
[facility] Progress Note   Assessment/Plan: 1. ESRD- TTS - had HD yesterday as family had rescinded desire for patient to stop dialysis.  Appreciate palliative facilitation of these conversations.  Dr. [name] has met with patient's wife for further discussion to clarify the minimal benefit to patient's QOL that dialysis would contribute. She has elected to stop dialysis at this time.   2 Group B strep bacteremia- off antibiotics 3. Anemia- hgb 9.2- anemia meds stopped; not drawing labs -  4. Secondary hyperparathyroidism-related meds stopped due to decision to stop HD 6/3 5.HTN/volume-^^^ volume BP ok - lowish at time s with intermittent tachycardia net UF 1.5 Tuesday 6. Nutrition- alb 1.9 - prostat/; pureed diet w supervision ST guidelines- poor intake 300 cc 6/3 and none recorded 6/4 7. AMS -secondary to bacteremia/ sepsis- variable 8.  FTT - as per #1-  9.Thrombocytopenia -s/p plt transfusion 5/30 and 5/31 - better 68 K 10. DM - ok 100s 11.Hx SDH -11/2017- has been residing in SNF prior to admission 12.  Disp - for comfort care.  Greatly appreciate the assistance of palliative care and the hospitalists - we will sign off. Had face to face discussion regarding care, ongoing procedures etc in setting of deteriorating phyical and mental status (brain bleed, sepsis, avg infection, bladder, severe debill, confusion, in setting of his baseline ESRD, DM , Cardiac dz, debill.  Physical, and mentally severely limited and QOL poor and doing life extending procedures are harmful to QOL.  Family agrees to stop dialysis and comfort measures.     Myriam Jacobson, PA-C [place] 03/16/2018,8:47 AM  LOS: 8 days  I have seen and examined this patient and agree with the plan of care see above.  Jeneen Rinks Curlie Sittner 03/16/2018, 9:34 AM   Subjective:   nonrepsonsive  Objective Vitals:   03/15/18 1549 03/15/18 1719 03/15/18 2113  03/16/18 0410  BP: (!) 136/59 (!) 103/36 125/69 (!) 146/45  Pulse: 89 (!) 50 (!) 105 93  Resp: _0 Temp: 97.6 F (36.4 C) 98.2 F (36.8 C) 98.2 F (36.8 C) 98.6 F (37 C)  TempSrc: Oral Oral Oral Oral  SpO2: 97%  100% 90%  Weight: 79.5 kg (175 lb 4.3 oz)  79 kg (174 lb 2.6 oz)   Height:       Physical Exam deferred today  General: Heart: Lungs: Abdomen: Extremities: Dialysis Access: left IJ TDC and left upper AVF   Additional Objective Labs: Basic Metabolic Panel: Recent Labs  Lab 03/11/18 0649 04-09-2018 0838 03/15/18 1244  NA 137 137 138  K 4.1 4.2 4.8  CL 94* 95* 96*  CO2 _1 GLUCOSE 89 84 113*  BUN 51* 58* 66*  CREATININE 6.33* 6.94* 6.66*  CALCIUM 8.8* 8.4* 8.7*  PHOS  --  6.0* 5.7*   Liver Function Tests: Recent Labs  Lab 03/10/18 0437 03/11/18 0649 04-09-2018 0838 03/15/18 1244  AST 33 32  --   --   ALT 17 11*  --   --   ALKPHOS 58 65  --   --   BILITOT 3.1* 3.1*  --   --   PROT 5.2* 4.7*  --   --   ALBUMIN 1.9* 1.9* 1.9* 1.9*   No results for input(s): LIPASE, AMYLASE in the last 168 hours. CBC: Recent Labs  Lab 03/10/18 0437  03/11/18 0649 April 09, 2018 0838 03/13/18 0418 03/14/18 0802 03/15/18 1244  WBC 11.1*   < >  10.1 9.3 7.3 6.6 8.5  NEUTROABS 8.0*  --   --   --   --   --   --   HGB 9.8*   < > 9.3* 9.0* 9.5* 9.4* 9.2*  HCT 27.8*   < > 26.1* 25.4* 27.3* 26.4* 26.1*  MCV 79.9   < > 79.6 77.4* 79.4 78.6 78.6  PLT 13*   < > 19* 65* 46* 49* 68*   < > = values in this interval not displayed.   Blood Culture    Component Value Date/Time   SDES BLOOD RIGHT ANTECUBITAL 03/11/2018 0742   SDES BLOOD RIGHT ANTECUBITAL 03/11/2018 0742   SPECREQUEST  03/11/2018 0742    BOTTLES DRAWN AEROBIC AND ANAEROBIC Blood Culture adequate volume   SPECREQUEST  03/11/2018 0742    BOTTLES DRAWN AEROBIC AND ANAEROBIC Blood Culture adequate volume   CULT  03/11/2018 0742    NO GROWTH 4 DAYS Performed at [facility], [place] [address]  [place], [place] [address]    CULT  03/11/2018 0742    NO GROWTH 4 DAYS Performed at [facility], [place] [address] [place], [place] [address]    REPTSTATUS PENDING 03/11/2018 0742   REPTSTATUS PENDING 03/11/2018 0742    Cardiac Enzymes: No results for input(s): CKTOTAL, CKMB, CKMBINDEX, TROPONINI in the last 168 hours. CBG: Recent Labs  Lab 03/14/18 0307 03/14/18 0749 03/14/18 1157 03/14/18 1551 03/14/18 2045  GLUCAP 186* 165* 168* 161* 147*   Iron Studies: No results for input(s): IRON, TIBC, TRANSFERRIN, FERRITIN in the last 72 hours. Lab Results  Component Value Date   INR 1.76 03/13/2018   INR 1.99 03/08/2018   INR 1.59 01/18/2018   Studies/Results: No results found. Medications: .  ceFAZolin (ANCEF) IV 1 g (03/15/18 1509)   . allopurinol  100 mg Oral Daily  . amiodarone  200 mg Oral Daily  . calcitRIOL  2 mcg Oral Q T,Th,Sa-HD  . Chlorhexidine Gluconate Cloth  6 each Topical Q0600  . Chlorhexidine Gluconate Cloth  6 each Topical Q0600  . diclofenac sodium  2 g Topical TID  . midodrine  5 mg Oral Q T,Th,Sa-HD  . oxybutynin  5 mg Oral BID

## 2018-03-16 NOTE — Progress Notes (Signed)
Daily Progress Note   Patient Name: Albert Stanley       Date: 03/16/2018 DOB: March 22, 1946  Age: 72 y.o. MRN#: [id number] Attending Physician: Tawni Millers Primary Care Physician: [name], MD Admit Date: 03/08/2018  Reason for Consultation/Follow-up: Establishing goals of care and Terminal Care  Subjective: Patient lethargic this morning. Will briefly open eyes with yes/no responses, but otherwise cannot participate in [place] conversation. Nods head yes to pain. RN to give prn medication.   GOC:  Dr. [name] and I met with patient's wife Bertram Millard), daughter Threasa Beards), and brother-in-law Josph Macho) at bedside.   Dr. [name] initiated [place] conversation including health decline in the last few months secondary to multiple co-morbidities/acute events and now with bacteremia. He medically recommended transition to comfort measures and hospice services.   Wife has been struggling with making the decision to discontinue dialysis. After Dr. Deterding's conversation, she speaks of feeling at peace with the decision to transition to comfort measures and not further prolonging his suffering. "I do not want him to suffer."   Educated on transition to comfort measures with goal of comfort, peace, and dignity at EOL. Educated on medications to ensure relief from suffering. Explained that interventions including antibiotics not aimed at comfort will be discontinued.   Educated on hospice services and philosophy. Ruby tells me multiple family members are visiting today. She would like to consider hospice services today and likely start the referral tomorrow.   Family asks about prognosis. Explained average prognosis when dialysis is discontinued (8-12 days), but also prepared her that this could  be a period of days with co-morbidities and infection.   Emotional/spiritual support provided. Therapeutic listening as family shares stories of Mr. Vanderhoef.    Length of Stay: 8  Current Medications: Scheduled Meds:  . allopurinol  100 mg Oral Daily  . amiodarone  200 mg Oral Daily  . calcitRIOL  2 mcg Oral Q T,Th,Sa-HD  . Chlorhexidine Gluconate Cloth  6 each Topical Q0600  . Chlorhexidine Gluconate Cloth  6 each Topical Q0600  . diclofenac sodium  2 g Topical TID  . midodrine  5 mg Oral Q T,Th,Sa-HD  . oxybutynin  5 mg Oral BID    Continuous Infusions: .  ceFAZolin (ANCEF) IV 1 g (03/15/18 1509)   Physical exam:  Constitutional: he appears ill  Pulmonary/chest: No accessory muscle usage. No tachypnea. No respiratory distress Neurological: lethargic, minimally responsive Skin: warm dry Nursing notes and vitals reviewed.   PRN Meds: acetaminophen, Chlorhexidine Gluconate Cloth, glycopyrrolate, HYDROmorphone (DILAUDID) injection, LORazepam, ondansetron **OR** ondansetron (ZOFRAN) IV, sodium chloride flush           Vital Signs: BP (!) 146/45 (BP Location: Right Arm)   Pulse 93   Temp 98.6 F (37 C) (Oral)   Resp 16   Ht '5\' 7"'  (1.702 m)   Wt 79 kg (174 lb 2.6 oz)   SpO2 90%   BMI 27.28 kg/m  SpO2: SpO2: 90 % O2 Device: O2 Device: Nasal Cannula O2 Flow Rate: O2 Flow Rate (L/min): 3 L/min  Intake/output summary:   Intake/Output Summary (Last 24 hours) at 03/16/2018 0839 Last data filed at 03/16/2018 0600 Gross per 24 hour  Intake 0 ml  Output 1500 ml  Net -1500 ml   LBM: Last BM Date: 03/15/18 Baseline Weight: Weight: 89 kg (196 lb 3.4 oz) Most recent weight: Weight: 79 kg (174 lb 2.6 oz)       Palliative Assessment/Data: PPS 20%     Patient Active Problem List   Diagnosis Date Noted  . Palliative care by specialist   . Right leg pain   . Bacteremia   . Sepsis due to group B Streptococcus ([place])   . Thrombocytopenia ([place])   . Coagulopathy ([place])   .  Sepsis due to undetermined organism with metabolic encephalopathy ([place]) 03/08/2018  . Benign fibroma of prostate 03/08/2018  . Cardiac arrhythmia 03/08/2018  . Chronic pain 03/08/2018  . Deep vein thrombosis (DVT) ([place]) 03/08/2018  . Gout 03/08/2018  . HLD (hyperlipidemia) 03/08/2018  . LBP (low back pain) 03/08/2018  . Malignant neoplasm of urinary bladder ([place]) 03/08/2018  . Foley catheter problem ([place]) 03/08/2018  . Pressure injury of skin 02/09/2018  . Hypoglycemia secondary to sulfonylurea 02/04/2018  . Hypoglycemia 02/03/2018  . Hematuria 01/06/2018  . Labile blood pressure   . Essential hypertension   . Acute on chronic diastolic heart failure ([place])   . Type 2 diabetes mellitus with peripheral neuropathy ([name])   . Acute deep vein thrombosis (DVT) of femoral vein of right lower extremity ([place])   . Traumatic subdural hematoma ([place]) 12/16/2017  . ESRD (end stage renal disease) ([place])   . ESRD (end stage renal disease) on dialysis ([place])   . Dysphagia   . Acute blood loss anemia   . Anemia of chronic disease   . Chronic diastolic congestive heart failure ([place])   . Diabetes mellitus type 2 in nonobese ([name])   . Benign essential HTN   . Nonischemic cardiomyopathy ([place])   . Transaminitis   . Goals of care, counseling/discussion   . Endotracheally intubated   . Ventilator dependent ([place])   . Chronic renal impairment   . Chronic in-center hemodialysis status ([place])   . S/P craniotomy 12/05/2017  . Hyperkalemia 12/02/2017  . Malnutrition of moderate degree 12/18/2016  . PVC's (premature ventricular contractions)   . Anemia associated with chronic renal failure   . End-stage renal disease on hemodialysis ([place]) 12/09/2016  . Dyslipidemia associated with type 2 diabetes mellitus ([place]) 12/09/2016  . Acute on chronic systolic and diastolic heart failure, NYHA class 1 ([place]) 12/09/2016  . 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 Type 2 diabetes mellitus ([place]) 12/11/2015  . Fluid overload 12/06/2015  . History of DVT (deep vein thrombosis)  12/04/2015  . Renal hematoma 12/04/2015  . H/O: gout 11/16/2013  . Trochanteric bursitis 10/17/2013  . 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 Lumbar degenerative disc disease 07/17/2013    Palliative Care Assessment & Plan   Patient Profile: 72 y.o. male admitted on 03/08/2018 from [facility] with increased confusion. He has a past medical history significant for ESRD on TTS HD, chronic hypotension on midodrine, DM type II, NICM EF 20%, chronic systolic CHF, SDH 12/5595 status post craniotomy, right femoral DVT status post IVC filter, urinary retention, bladder cancer status post resection, renal mass, recent hospitalization 02/03/2018-02/07/2018 for refractory hypoglycemia. Six admissions in the last six months. This admission, patient septic secondary to group B strep bacteremia likely due to abscess at left upper extremity AVG or TDC infection. ID following. Poor prognosis. Palliative medicine consultation for goals of care.   Assessment: Sepsis Group B strep bacteremia ESRD on hemodialysis Thrombocytopenia Acute metabolic encephalopathy Chronic urinary retention s/p bladder cancer/resection Adult failure to thrive Hx of recurrent DVT Hx of SDH s/p craniotomy  Recommendations/Plan:  After further GOC discussion with Dr. [name], family has chosen transition to comfort measures only. Family understands labs/interventions/medications not aimed at comfort will be discontinued.   Symptom management  Dilaudid 0.55m IV q2h prn pain/dyspnea/air hunger  Ativan 0.584mIV q4h prn anxiety  Robinul 0.70m34mV q4h prn secretions  Discussed hospice services and philosophy. Family considering hospice options. [facility] will f/u with wife in AM to further discuss disposition plan for hospice.   Code Status: DNR/DNI   Code Status  Orders  (From admission, onward)        Start     Ordered   03/08/18 1615  Do not attempt resuscitation (DNR)  Continuous    Question Answer Comment  In the event of cardiac or respiratory ARREST Do not call a "code blue"   In the event of cardiac or respiratory ARREST Do not perform Intubation, CPR, defibrillation or ACLS   In the event of cardiac or respiratory ARREST Use medication by any route, position, wound care, and other measures to relive pain and suffering. May use oxygen, suction and manual treatment of airway obstruction as needed for comfort.      03/08/18 1620    Code Status History    Date Active Date Inactive Code Status Order ID Comments User Context   02/08/2018 1753 02/11/2018 2021 Full Code [id number] ReachD Inpatient   02/03/2018 1331 02/07/2018 1853 Full Code [id number] GunningP ED   12/16/2017 1805 01/05/2018 1630 Full Code [id number] ParsonsA-C Inpatient   12/16/2017 1805 12/16/2017 1805 Full Code [id number] ParsonsA-C Inpatient   12/05/2017 1933 12/16/2017 1755 Full Code [id number] PersonP Inpatient   12/09/2016 2020 12/18/2016 1853 Full Code [id number] BlazeD Inpatient   06/25/2016 1139 06/25/2016 1531 Full Code [id number] MitchellD Inpatient   06/09/2016 1002 06/09/2016 1726 Full Code [id number] MitchellD Inpatient   02/28/2016 1239 02/28/2016 1926 Full Code [id number] DresserD Inpatient   12/04/2015 1824 12/18/2015 2208 Full Code [id number] HaffD Inpatient    Advance Directive Documentation     Most Recent Value  Type of Advance Directive  Out of facility DNR (pink MOST or yellow form)  Pre-existing out of facility DNR order (yellow form or pink MOST form)  Yellow form placed in chart (order not valid for inpatient use)  "MOST" Form in Place?  -       Prognosis:   < 2 weeks: poor prognosis secondary to ESRD with discontinuation of hemodialysis and  with bacteremia secondary to ? AVG/TDC infection. No further life-prolonging interventions.   Discharge Planning:  To Be Determined likely hospice facility  Care plan was discussed with wife, daughter, patient, BIL, RN, Dr. [name], updated Dr. Cathlean Sauer  Thank you for allowing the Palliative Medicine Team to assist in the care of this patient.   Time In: 0830- Time Out: 0940 Total Time 69mn Prolonged Time Billed yes      Greater than 50%  of this time was spent counseling and coordinating care related to the above assessment and plan.  MIhor Dow FNP-C Palliative Medicine Team  Phone: [phone]: [phone] Please contact Palliative Medicine Team phone at [phone] questions and concerns.

## 2018-03-16 NOTE — Progress Notes (Signed)
PROGRESS NOTE    Albert Stanley  RJJ:[id number] DOB: 03-07-1946 DOA: 03/08/2018 PCP: [name], MD    Brief Narrative:  72 year old male who presented with altered mental status.  Patient does have a significant past medical history for end-stage renal disease on hemodialysis, chronic hypotension, type 2 diabetes mellitus, diastolic heart failure, subdural hematoma status post craniotomy, right femoral DVT status post IVC filter, urinary retention, bladder cancer status post resection, and recent hospitalization for hypoglycemia.  Patient was noted to be confused, at the dialysis center.  On the initial physical examination his temperature was 100.8, heart rate 102, blood pressure 120/80, respiratory rate 17, oxygen saturation 100%.  Moist mucous membranes, lungs are clear to auscultation bilaterally, heart S1-S2 present and rhythmic, the abdomen was soft nontender, no lower extremity edema.  Patient was awake but confused, unable to answer questions.  Patient was admitted to the hospital with working diagnosis of metabolic encephalopathy due to suspected sepsis.   Patient had a prolonged hospitalization, blood cultures positive for group B streptococcus, suspected abscess at the site of the left upper extremity AV graft.  Family meeting took place, decision was made to continue care under comfort measures hemodialysis has been discontinued.   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 Sepsis due to group B streptococcus bacteremia, suspected AVG abscess or temporal dialysis catheter infection. Dialysis and antibiotic therapy will be discontinued. Will continue comfort measures and follow up with palliative care team. Social services to arrange for hospice. Continue pain control with hydromorphone as needed.   2. Metabolic encephalopathy. Patient continue to be encephalopathic, will continue neuro checks per unit protocol.   3. Thrombocytopenia. Platelets have been improving, likely multifactorial, no active bleeding. Plan to hold transfusion unless platelet count less than 20, 000.   4. ESRD. Case discussed with Dr. [name], considering patient's condition, poor prognosis and quality of life, will hold on further hemodialysis. Consult palliative care team.   5. Chronic urinary retention/ sp bladder cancer and resection.  Patient had a chronic foley cathter that was removed.   6. T2dm. Continue glucose cover and monitoring.   7. Paroxysmal atrial fibrillation. Patient back on sinus, not candidate for anticoagulation.   DVT prophylaxis:  Heparin   Code Status:   dnr  Family Communication: no family at the bedside  Disposition Plan:  Home with hospice in am.    Consultants:   Nephrology   Palliative care  Procedures:     Antimicrobials:       Subjective: Patient somnolent unable to give detailed history, no nausea or vomiting, no chest pain or apparent dyspnea.   Objective: Vitals:   03/15/18 1719 03/15/18 2113 03/16/18 0410 03/16/18 1020  BP: (!) 103/36 125/69 Marland Kitchen)  146/45 114/66  Pulse: (!) 50 (!) 105 93 (!) 112  Resp: 18 20 16 18   Temp: 98.2 F (36.8 C) 98.2 F (36.8 C) 98.6 F (37 C) 98.3 F (36.8 C)    TempSrc: Oral Oral Oral Oral  SpO2:  100% 90%   Weight:  79 kg (174 lb 2.6 oz)    Height:        Intake/Output Summary (Last 24 hours) at 03/16/2018 1106 Last data filed at 03/16/2018 0600 Gross per 24 hour  Intake 0 ml  Output 1500 ml  Net -1500 ml   Filed Weights   03/15/18 1115 03/15/18 1549 03/15/18 2113  Weight: 82.8 kg (182 lb 8.7 oz) 79.5 kg (175 lb 4.3 oz) 79 kg (174 lb 2.6 oz)    Examination:   General: deconditioned and ill looking appearing  Neurology: somnolent, non focal  E ENT: mild pallor, no icterus, oral mucosa moist Cardiovascular: No JVD. S1-S2 present, rhythmic, no gallops, rubs, or murmurs. Trace non pitting lower extremity edema. Pulmonary: decerased breath sounds bilaterally, adequate air movement, no wheezing, rhonchi or rales. Gastrointestinal. Abdomen  no organomegaly, non tender, no rebound or guarding Skin. No rashes Musculoskeletal: no joint deformities     Data Reviewed: I have personally reviewed following labs and imaging studies  CBC: Recent Labs  Lab 03/10/18 0437  03/11/18 0649 Mar 28, 2018 0838 03/13/18 0418 03/14/18 0802 03/15/18 1244  WBC 11.1*   < > 10.1 9.3 7.3 6.6 8.5  NEUTROABS 8.0*  --   --   --   --   --   --   HGB 9.8*   < > 9.3* 9.0* 9.5* 9.4* 9.2*  HCT 27.8*   < > 26.1* 25.4* 27.3* 26.4* 26.1*  MCV 79.9   < > 79.6 77.4* 79.4 78.6 78.6  PLT 13*   < > 19* 65* 46* 49* 68*   < > = values in this interval not displayed.   Basic Metabolic Panel: Recent Labs  Lab 03/10/18 0437 03/11/18 0649 March 28, 2018 0838 03/15/18 1244  NA 138 137 137 138  K 3.8 4.1 4.2 4.8  CL 92* 94* 95* 96*  CO2 28 29 28 26   GLUCOSE 47* 89 84 113*  BUN 36* 51* 58* 66*  CREATININE 5.36* 6.33* 6.94* 6.66*  CALCIUM 9.0 8.8* 8.4* 8.7*  PHOS  --   --  6.0* 5.7*   GFR: Estimated Creatinine Clearance: 9.4 mL/min (A) (by C-G formula based on SCr of 6.66 mg/dL (H)). Liver Function Tests: Recent Labs  Lab 03/10/18 0437 03/11/18 0649 Mar 28, 2018 0838  03/15/18 1244  AST 33 32  --   --   ALT 17 11*  --   --   ALKPHOS 58 65  --   --   BILITOT 3.1* 3.1*  --   --   PROT 5.2* 4.7*  --   --   ALBUMIN 1.9* 1.9* 1.9* 1.9*   No results for input(s): LIPASE, AMYLASE in the last 168 hours. No results for input(s): AMMONIA in the last 168 hours. Coagulation Profile: Recent Labs  Lab 03/13/18 0418  INR 1.76   Cardiac Enzymes: No results for input(s): CKTOTAL, CKMB, CKMBINDEX, TROPONINI in the last 168 hours. BNP (last 3 results) No results for input(s): PROBNP in the last 8760 hours. HbA1C: No results for input(s): HGBA1C in the last 72 hours. CBG: Recent Labs  Lab 03/14/18 0307 03/14/18 0749 03/14/18 1157 03/14/18 1551 03/14/18 2045  GLUCAP 186* 165* 168* 161* 147*   Lipid Profile:  No results for input(s): CHOL, HDL, LDLCALC, TRIG, CHOLHDL, LDLDIRECT in the last 72 hours. Thyroid Function Tests: No results for input(s): TSH, T4TOTAL, FREET4, T3FREE, THYROIDAB in the last 72 hours. Anemia Panel: No results for input(s): VITAMINB12, FOLATE, FERRITIN, TIBC, IRON, RETICCTPCT in the last 72 hours.    Radiology Studies: I have reviewed all of the imaging during this hospital visit personally     Scheduled Meds: . diclofenac sodium  2 g Topical TID   Continuous Infusions:   LOS: 8 days        Merrilyn Legler Gerome Apley, MD [facility] Pager [phone]

## 2018-03-16 NOTE — Progress Notes (Signed)
Labs reviewed. Platelet counts improved with treatment of infection and adjusting antibiotics. No addittional recommendation at this time.  Sullivan Lone MD MS

## 2018-03-16 NOTE — Plan of Care (Signed)
  Problem: Education: Goal: Knowledge of General Education information will improve Outcome: Not Progressing Note:  Pt. lethargic and not verbally responding at times.

## 2018-03-17 DIAGNOSIS — D631 Anemia in chronic kidney disease: Secondary | ICD-10-CM

## 2018-03-17 DIAGNOSIS — Z515 Encounter for palliative care: Secondary | ICD-10-CM

## 2018-03-17 DIAGNOSIS — Z86718 Personal history of other venous thrombosis and embolism: Secondary | ICD-10-CM

## 2018-03-17 DIAGNOSIS — N189 Chronic kidney disease, unspecified: Secondary | ICD-10-CM

## 2018-03-17 DIAGNOSIS — A419 Sepsis, unspecified organism: Secondary | ICD-10-CM

## 2018-03-17 MED ORDER — HEPARIN SODIUM (PORCINE) 1000 UNIT/ML IJ SOLN
1000.0000 [IU] | Freq: Once | INTRAMUSCULAR | Status: AC
  Administered 2018-03-17: 1000 [IU] via INTRAVENOUS

## 2018-03-17 MED ORDER — HYDROMORPHONE HCL 1 MG/ML IJ SOLN
0.5000 mg | Freq: Four times a day (QID) | INTRAMUSCULAR | Status: DC
  Administered 2018-03-17 (×2): 0.5 mg via INTRAVENOUS

## 2018-03-17 NOTE — Progress Notes (Signed)
Patient was discharged to [facility] via by transport. IV was removed and AVS sent with patient. Report was given to the RN at [facility] via telephone. Patient's family is aware that the patient is on the way to [facility].   De Nurse, RN

## 2018-03-17 NOTE — Clinical Social Work Note (Signed)
Patient will discharge to [facility] today, transported by ambulance. Discharge clinicals transmitted to facility and family aware of today's discharge and transport arrangements. CSW signing off as no other SW intervention services needed.  Gray Maugeri Givens, MSW, LCSW Licensed Clinical Social Worker [place] [phone]

## 2018-03-17 NOTE — Progress Notes (Signed)
[facility] Liaison RN visit.   Received request from Lorriane Shire, [place] for family interest in [facility] with request for transfer. Chart reviewed. Patient approved for [facility].  Met with wife, Bertram Millard to confirm interest and explain services. Family agreeable to transfer today. Lorriane Shire,  [place] aware.  Registration paper work completed.. Dr. Orpah Melter to assume care per family request. Please fax discharge summary to [phone]. RN please call report to [phone]. Please arrange transport for patient to arrive as soon as possible.  Thank you.   Farrel Gordon, RN, [facility] Liaison  [place] are on AMION

## 2018-03-17 NOTE — Progress Notes (Signed)
Daily Progress Note   Patient Name: Albert Stanley       Date: 03/17/2018 DOB: 08/14/1946  Age: 72 y.o. MRN#: [id number] Attending Physician: Tawni Millers Primary Care Physician: [name], MD Admit Date: 03/08/2018   Reason for Consultation/Follow-up: Establishing goals of care and Terminal Care  Subjective/GOC: Patient wakes to voice. I asked if he is in pain this morning and he states "pain every day" and asking if he can receive pain medication now.   Spoke with wife, Bertram Millard, via telephone. She is agreeable with me scheduling low dose dilaudid for pain control. She tells me he may be moved to hospice facility today.   She speaks of her daughter "not ready" for this decision because he was awake and alert last night. Again discussed course of hospitalization including declining health in the past few months. Explained EOL expectations and that 'surges of energy' can occur. We discussed what a blessing these surges are so he can spend quality time with family. Also reassured focus on comfort, quality, and his dignity at EOL. Wife thankful for my call and understands she can call me back with questions or concerns.   Length of Stay: 9  Current Medications: Scheduled Meds:  . diclofenac sodium  2 g Topical TID  .  HYDROmorphone (DILAUDID) injection  0.5 mg Intravenous Q6H    Continuous Infusions:  Physical exam:  Constitutional: he appears ill Pulmonary/chest: No accessory muscle usage. No tachypnea. No respiratory distress Neurological: lethargic, minimally responsive Skin: warm dry Nursing notes and vitals reviewed.   PRN Meds: acetaminophen, glycopyrrolate, HYDROmorphone (DILAUDID) injection, LORazepam, ondansetron **OR** ondansetron (ZOFRAN) IV, sodium chloride  flush           Vital Signs: BP 116/70 (BP Location: Right Wrist)   Pulse (!) 124   Temp 98.6 F (37 C)   Resp 15   Ht 5\' 7"  (1.702 m)   Wt 79 kg (174 lb 2.6 oz)   SpO2 100%   BMI 27.28 kg/m  SpO2: SpO2: 100 % O2 Device: O2 Device: Nasal Cannula O2 Flow Rate: O2 Flow Rate (L/min): 3 L/min  Intake/output summary:   Intake/Output Summary (Last 24 hours) at 03/17/2018 1053 Last data filed at 03/17/2018 0600 Gross per 24 hour  Intake 0 ml  Output 0 ml  Net 0 ml  LBM: Last BM Date: 03/16/18 Baseline Weight: Weight: 89 kg (196 lb 3.4 oz) Most recent weight: Weight: 79 kg (174 lb 2.6 oz)       Palliative Assessment/Data: PPS 20%     Patient Active Problem List   Diagnosis Date Noted  . Terminal care   . Palliative care by specialist   . Right leg pain   . Bacteremia   . Sepsis due to group B Streptococcus ([place])   . Thrombocytopenia ([place])   . Coagulopathy ([place])   . Sepsis due to undetermined organism with metabolic encephalopathy ([place]) 03/08/2018  . Benign fibroma of prostate 03/08/2018  . Cardiac arrhythmia 03/08/2018  . Chronic pain 03/08/2018  . Deep vein thrombosis (DVT) ([place]) 03/08/2018  . Gout 03/08/2018  . HLD (hyperlipidemia) 03/08/2018  . LBP (low back pain) 03/08/2018  . Malignant neoplasm of urinary bladder ([place]) 03/08/2018  . Foley catheter problem ([place]) 03/08/2018  . Pressure injury of skin 02/09/2018  . Hypoglycemia secondary to sulfonylurea 02/04/2018  . Hypoglycemia 02/03/2018  . Hematuria 01/06/2018  . Labile blood pressure   . Essential hypertension   . Acute on chronic diastolic heart failure ([place])   . Type 2 diabetes mellitus with peripheral neuropathy ([name])   . Acute deep vein thrombosis (DVT) of femoral vein of right lower extremity ([place])   . Traumatic subdural hematoma ([place]) 12/16/2017  . ESRD (end stage renal disease) ([place])   . ESRD (end stage renal disease) on dialysis ([place])   . Dysphagia   . Acute blood loss anemia   . Anemia of  chronic disease   . Chronic diastolic congestive heart failure ([place])   . Diabetes mellitus type 2 in nonobese ([name])   . Benign essential HTN   . Nonischemic cardiomyopathy ([place])   . Transaminitis   . Goals of care, counseling/discussion   . Endotracheally intubated   . Ventilator dependent ([place])   . Chronic renal impairment   . Chronic in-center hemodialysis status ([place])   . S/P craniotomy 12/05/2017  . Hyperkalemia 12/02/2017  . Malnutrition of moderate degree 12/18/2016  . PVC's (premature ventricular contractions)   . Anemia associated with chronic renal failure   . End-stage renal disease on hemodialysis ([place]) 12/09/2016  . Dyslipidemia associated with type 2 diabetes mellitus ([place]) 12/09/2016  . Acute on chronic systolic and diastolic heart failure, NYHA class 1 ([place]) 12/09/2016  . 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 Type 2 diabetes mellitus ([place]) 12/11/2015  . Fluid overload 12/06/2015  . History of DVT (deep vein thrombosis) 12/04/2015  . Renal hematoma 12/04/2015  . H/O: gout 11/16/2013  . Trochanteric bursitis 10/17/2013  . 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 Lumbar degenerative disc disease 07/17/2013    Palliative Care Assessment & Plan   Patient Profile: 72 y.o. male admitted on 03/08/2018 from [facility] with increased confusion. He has a past medical history significant for ESRD on TTS HD, chronic hypotension on midodrine, DM type II, NICM EF 95%, chronic systolic CHF, SDH 03/3874 status post craniotomy, right femoral DVT status post IVC filter, urinary retention, bladder cancer status post resection, renal mass, recent hospitalization 02/03/2018-02/07/2018 for refractory hypoglycemia. Six admissions in the last six months. This admission, patient septic secondary to group B strep bacteremia likely due to abscess at left upper extremity AVG or TDC infection. ID following. Poor prognosis. Palliative medicine  consultation for goals of care.   Assessment: Sepsis Group B strep bacteremia ESRD on  hemodialysis Thrombocytopenia Acute metabolic encephalopathy Chronic urinary retention s/p bladder cancer/resection Adult failure to thrive Hx of recurrent DVT Hx of SDH s/p craniotomy  Recommendations/Plan:  After further GOC discussion with Dr. [name], family has chosen transition to comfort measures only. Family understands labs/interventions/medications not aimed at comfort will be discontinued.   Symptom management  Dilaudid 0.5mg  IV q2h prn pain/dyspnea/air hunger  Scheduled dilaudid 0.5mg  IV q6h for pain control  Ativan 0.5mg  IV q4h prn anxiety  Robinul 0.2mg  IV q4h prn secretions  SW consulted for residential hospice placement. Patient may transfer today if bed available.   Code Status: DNR/DNI   Code Status Orders  (From admission, onward)        Start     Ordered   03/08/18 1615  Do not attempt resuscitation (DNR)  Continuous    Question Answer Comment  In the event of cardiac or respiratory ARREST Do not call a "code blue"   In the event of cardiac or respiratory ARREST Do not perform Intubation, CPR, defibrillation or ACLS   In the event of cardiac or respiratory ARREST Use medication by any route, position, wound care, and other measures to relive pain and suffering. May use oxygen, suction and manual treatment of airway obstruction as needed for comfort.      03/08/18 1620    Code Status History    Date Active Date Inactive Code Status Order ID Comments User Context   02/08/2018 1753 02/11/2018 2021 Full Code [id number]  Elwyn Reach, MD Inpatient   02/03/2018 1331 02/07/2018 1853 Full Code [id number]  Radene Gunning, NP ED   12/16/2017 1805 01/05/2018 1630 Full Code [id number]  Cathlyn Parsons, PA-C Inpatient   12/16/2017 1805 12/16/2017 1805 Full Code [id number]  Cathlyn Parsons, PA-C Inpatient   12/05/2017 1933 12/16/2017 1755 Full Code [id number]  Omar Person,  NP Inpatient   12/09/2016 2020 12/18/2016 1853 Full Code [id number]  Theodis Blaze, MD Inpatient   06/25/2016 1139 06/25/2016 1531 Full Code [id number]  Serafina Mitchell, MD Inpatient   06/09/2016 1002 06/09/2016 1726 Full Code [id number]  Serafina Mitchell, MD Inpatient   02/28/2016 1239 02/28/2016 1926 Full Code [id number]  Larey Dresser, MD Inpatient   12/04/2015 1824 12/18/2015 2208 Full Code [id number]  Bonnielee Haff, MD Inpatient    Advance Directive Documentation     Most Recent Value  Type of Advance Directive  Out of facility DNR (pink MOST or yellow form)  Pre-existing out of facility DNR order (yellow form or pink MOST form)  Yellow form placed in chart (order not valid for inpatient use)  "MOST" Form in Place?  -       Prognosis:   < 2 weeks: poor prognosis secondary to ESRD with discontinuation of hemodialysis and with bacteremia secondary to ? AVG/TDC infection. No further life-prolonging interventions.   Discharge Planning:  To Be Determined likely hospice facility  Care plan was discussed with patient, wife  Thank you for allowing the Palliative Medicine Team to assist in the care of this patient.   Time In: 1040 Time Out: 1055 Total Time 49min Prolonged Time Billed no      Greater than 50%  of this time was spent counseling and coordinating care related to the above assessment and plan.  Ihor Dow, FNP-C Palliative Medicine Team  Phone: [phone] Fax: [phone]  Please contact Palliative Medicine Team phone at [phone] for questions and concerns.

## 2018-03-17 NOTE — Discharge Summary (Addendum)
Physician Discharge Summary  Albert Stanley MCN:[id number] DOB: 1945-12-20 DOA: 03/08/2018  PCP: [name], MD  Admit date: 03/08/2018 Discharge date: 03/17/2018  Admitted From: Home  Disposition:  Hospice  Recommendations for Outpatient Follow-up and new medication changes:  1. Follow up with Dr. [name] in 1-week 2. Hemodialysis has been discontinued 3. Follow with hospice team recommendations:  ? Dilaudid 0.5mg  IV q2h prn pain/dyspnea/air hunger ? Scheduled dilaudid 0.5mg  IV q6h for pain control ? Ativan 0.5mg  IV q4h prn anxiety ? Robinul 0.2mg  IV q4h prn secretions  Home Health: na   Equipment/Devices: na   Discharge Condition: stable  CODE STATUS: DNR  Diet recommendation: Heart healthy and renal prudent.   Brief/Interim Summary: 72 year old male who presented with altered mental status.  Patient does have a significant past medical history for end-stage renal disease on hemodialysis, chronic hypotension, type 2 diabetes mellitus, diastolic heart failure, subdural hematoma status post craniotomy, right femoral DVT status post IVC filter, urinary retention, bladder cancer status post resection, and recent hospitalization for hypoglycemia.  Patient was noted to be confused, at the dialysis center.  On the initial physical examination his temperature was 100.8, heart rate 102, blood pressure 120/80, respiratory rate 17, oxygen saturation 100%.  Moist mucous membranes, lungs were clear to auscultation bilaterally, heart S1-S2 present and rhythmic, the abdomen was soft nontender, no lower extremity edema. Patient was awake but confused, unable to answer questions.  Sodium 134, potassium 4.4, chloride 92, bicarb 24, glucose 140, BUN 63, creatinine 8.0.  White count 6.8, hemoglobin 10.6, hematocrit 30.7, platelets 25.  Head CT with no acute findings.  CT chest and chest x-ray with by basilar atelectasis.  EKG sinus rhythm, left axis deviation, positive PVCs  Patient was admitted  to the hospital with working diagnosis of metabolic encephalopathy due to suspected sepsis.   Patient had a prolonged hospitalization, blood cultures positive for group B streptococcus, suspected abscess at the site of the left upper extremity AV graft.  Family meeting took place, decision was made to continue care under comfort measures and hemodialysis was discontinued.   1.  Sepsis due to group B streptococcus bacteremia, suspected AV graft or temporal dialysis catheter related infection (present on admission).  Patient  was admitted to the medical ward, he was placed on a remote telemetry monitor, received broad-spectrum antibiotic therapy with IV vancomycin and cefepime.  Antibiotics were de-escalated to cefazolin and rifampin.  Repeat blood cultures came no growth.  Transthoracic echocardiography with no vegetation.  Patient with overall poor prognosis, very poor quality of life, family meeting took place and decision was made to transition to comfort measures, hemodialysis was discontinued.  2.  Metabolic encephalopathy.  Patient has remained encephalopathic, confused and is oriented, no agitation.  Continue supportive medical therapy.  Patient will be transferred to hospice.  3.  Thrombocytopenia.  Multifactorial, likely related to sepsis.  Count have been improving, discharge platelets 68,000.  No signs of bleeding.  4.  Chronic urinary retention, in the setting of bladder cancer status post resection, chronic indwelling Foley catheter.  Foley catheter was removed, no signs of further urinary retention, will need close follow-up as an outpatient.  5.  Type 2 diabetes mellitus.  Patient was placed on insulin sliding scale for glucose coverage and monitoring.  Capillary glucose remained stable.  6.  Hypotension.  Continue midodrine as an outpatient.  7.  Chronic systolic heart failure with new onset paroxysmal atrial relation.  No signs of acute decompensation, patient did return  to sinus  rhythm, no aggressive measures, no anticoagulation due to thrombocytopenia and risk of bleeding.  8.  End-stage renal disease.  Patient with very poor prognosis, poor quality of life, he has been deteriorating despite medical therapy.  Family meeting took place and his family decided to stop dialysis.   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 ([name])   Anemia associated with chronic renal failure   Malnutrition of moderate degree   S/P craniotomy   Goals of care, counseling/discussion   Dysphagia   Benign essential HTN   Foley catheter problem ([name])   Thrombocytopenia ([name])   Coagulopathy ([name])   Palliative care by specialist   Right leg pain   Bacteremia   Sepsis due to group B Streptococcus ([place])   Terminal care    Discharge Instructions   Allergies as of 03/17/2018      Reactions   Meperidine Shortness Of Breath, Other (See Comments)   Increased heart rate; Reaction to demerol   Penicillins Other (See Comments)   Tolerates cefazolin Has patient had a PCN reaction causing immediate rash, facial/tongue/throat swelling, SOB or lightheadedness with hypotension: Yes (syncope) Has patient had a PCN reaction causing severe rash involving mucus membranes or skin necrosis: No Has patient had a PCN reaction that required hospitalization: No (treated at MD's office) Has patient had a PCN reaction occurring within the last 10 years: No If all of the above answers are "NO", then may proceed with Cephalosporin use.   Sulfa Antibiotics Shortness Of Breath, Other (See Comments)   Increased heart rate   Sulfonamide Derivatives  Shortness Of Breath, Other (See Comments)   TACHYCARDIA   Lac Bovis Diarrhea, Nausea And Vomiting, Other (See Comments)   Constipation Reaction to whole milk   Lactose Intolerance (gi) Diarrhea, Nausea And Vomiting, Other (See Comments)   Constipation Reaction to whole milk   Milk-related Compounds Diarrhea, Nausea And Vomiting, Other (See Comments)   Constipation Reaction to whole milk   Foltx [elfolate Plus] Other (See Comments)   UNSPECIFIED REACTION  "Pt does not recall reaction to this"   Betadine [povidone Iodine] Itching, Rash, Other (See Comments)   burning   Eggs Or Egg-derived Products Nausea And Vomiting   Reaction to egg yolks - not whites      Medication List    STOP taking these medications   allopurinol 300 MG tablet Commonly known as:  ZYLOPRIM   amiodarone 200 MG tablet Commonly known as:  PACERONE   bacitracin ointment   bisacodyl 5 MG EC tablet Commonly known as:  DULCOLAX   diphenhydrAMINE 25 MG tablet Commonly known as:  BENADRYL   docusate sodium 100 MG capsule Commonly known as:  COLACE   lactulose 10 GM/15ML solution Commonly known as:  CHRONULAC   liver oil-zinc oxide 40 % ointment Commonly known as:  DESITIN   Melatonin 3 MG Tabs   midodrine 5 MG tablet Commonly known as:  PROAMATINE   multivitamin Tabs tablet   neomycin-bacitracin-polymyxin [id number] ointment   oxyCODONE-acetaminophen 7.5-325 MG tablet Commonly known as:  PERCOCET   PROCOMYCIN 4 % Crea   Zinc Oxide 13 % Crea     TAKE these  medications   acetaminophen 500 MG tablet Commonly known as:  TYLENOL Take 500 mg by mouth every 6 (six) hours as needed for mild pain.   diclofenac sodium 1 % Gel Commonly known as:  VOLTAREN Apply 2 g topically 3 (three) times daily. Apply to lower back, left hip and knee   feeding supplement (PRO-STAT SUGAR FREE 64) Liqd Take 30 mLs by mouth 2 (two) times daily.   multivitamin with minerals Tabs tablet Take 1 tablet by mouth  daily.   oxybutynin 5 MG tablet Commonly known as:  DITROPAN Take 5 mg by mouth 2 (two) times daily.   pantoprazole 40 MG tablet Commonly known as:  PROTONIX Take 40 mg by mouth at bedtime.   senna 8.6 MG Tabs tablet Commonly known as:  SENOKOT Take 1 tablet by mouth at bedtime.   sevelamer carbonate 800 MG tablet Commonly known as:  RENVELA Take 3 tablets (2,400 mg total) by mouth 3 (three) times daily with meals.      Follow-up Information    [name], MD Follow up.   Specialty:  Internal Medicine Contact information: [address] [place] [address] [phone]        [facility] Follow up.   Specialty:  Home Health Services Why:  They will contact you to resume visits at discharge. Contact information: [place] [address] [phone]          Allergies  Allergen Reactions  . Meperidine Shortness Of Breath and Other (See Comments)    Increased heart rate; Reaction to demerol  . Penicillins Other (See Comments)    Tolerates cefazolin  Has patient had a PCN reaction causing immediate rash, facial/tongue/throat swelling, SOB or lightheadedness with hypotension: Yes (syncope) Has patient had a PCN reaction causing severe rash involving mucus membranes or skin necrosis: No Has patient had a PCN reaction that required hospitalization: No (treated at MD's office) Has patient had a PCN reaction occurring within the last 10 years: No If all of the above answers are "NO", then may proceed with Cephalosporin use.  . Sulfa Antibiotics Shortness Of Breath and Other (See Comments)    Increased heart rate  . Sulfonamide Derivatives Shortness Of Breath and Other (See Comments)    TACHYCARDIA  . Lac Bovis Diarrhea, Nausea And Vomiting and Other (See Comments)    Constipation Reaction to whole milk  . Lactose Intolerance (Gi) Diarrhea, Nausea And Vomiting and Other (See Comments)    Constipation Reaction to  whole milk  . Milk-Related Compounds Diarrhea, Nausea And Vomiting and Other (See Comments)    Constipation Reaction to whole milk  . Foltx [Elfolate Plus] Other (See Comments)    UNSPECIFIED REACTION  "Pt does not recall reaction to this"  . Betadine [Povidone Iodine] Itching, Rash and Other (See Comments)    burning  . Eggs Or Egg-Derived Products Nausea And Vomiting    Reaction to egg yolks - not whites      Consultations:  Nephrology   Palliative Care   Procedures/Studies: Dg Chest 2 View  Result Date: 02/26/2018 CLINICAL DATA:  Missing dialysis point.  Shortness of breath. EXAM: CHEST - 2 VIEW COMPARISON:  02/03/2018 and 12/24/2017 FINDINGS: Left IJ central venous catheter unchanged with tip over the cavoatrial junction. Lungs are adequately inflated without focal airspace consolidation or effusion. Moderate stable cardiomegaly. Remainder of the exam is unchanged. IMPRESSION: No acute cardiopulmonary disease. Moderate stable cardiomegaly. Left IJ dialysis catheter unchanged. Electronically Signed  By: Marin Olp M.D.   On: 02/26/2018 15:08   Ct Head Wo Contrast  Result Date: 03/08/2018 CLINICAL DATA:  Altered level of consciousness EXAM: CT HEAD WITHOUT CONTRAST TECHNIQUE: Contiguous axial images were obtained from the base of the skull through the vertex without intravenous contrast. COMPARISON:  01/18/2018 FINDINGS: Brain: No evidence of acute infarction, hemorrhage, extra-axial collection, ventriculomegaly, or mass effect. Generalized cerebral atrophy. Periventricular white matter low attenuation likely secondary to microangiopathy. Vascular: Cerebrovascular atherosclerotic calcifications are noted. Skull: Prior right frontoparietal craniotomy. Sinuses/Orbits: Visualized portions of the orbits are unremarkable. Visualized portions of the paranasal sinuses and mastoid air cells are unremarkable. Other: None. IMPRESSION: No acute intracranial pathology. Electronically Signed    By: Kathreen Devoid   On: 03/08/2018 14:29   Ct Chest Wo Contrast  Result Date: 03/08/2018 CLINICAL DATA:  Altered mental status and history of end-stage renal disease. EXAM: CT CHEST WITHOUT CONTRAST TECHNIQUE: Multidetector CT imaging of the chest was performed following the standard protocol without IV contrast. COMPARISON:  Chest x-ray earlier today. FINDINGS: Cardiovascular: The heart is moderately enlarged. No pericardial effusion identified. Calcified coronary artery plaque in a 3 vessel distribution. Dialysis catheter present extending to the SVC/RA junction. Mediastinum/Nodes: No enlarged mediastinal or axillary lymph nodes. Thyroid gland, trachea, and esophagus demonstrate no significant findings. Lungs/Pleura: No overt edema. Dependent posterior opacities in both lower lobes most likely represent atelectasis. Subtle bibasilar pneumonia cannot be excluded. There are trace bilateral pleural effusions. No pneumothorax or pulmonary nodule identified. Upper Abdomen: No acute abnormality. Musculoskeletal: No bony fractures or focal lesions. Diffuse body wall edema present. IMPRESSION: 1. Bibasilar opacities most likely represent atelectasis. Subtle bibasilar pneumonia cannot be excluded. There are small bilateral pleural effusions without evidence of pulmonary edema. 2. Moderate cardiac enlargement. 3. Coronary atherosclerosis with calcified plaque in a 3 vessel distribution. 4. Dialysis catheter with tip at SVC/RA junction. 5. Diffuse body wall edema and anasarca. Electronically Signed   By: Aletta Edouard M.D.   On: 03/08/2018 18:12   Dg Chest Port 1 View  Result Date: 03/08/2018 CLINICAL DATA:  72 year old male with altered mental status. End-stage renal disease. EXAM: PORTABLE CHEST 1 VIEW COMPARISON:  02/26/2018 and earlier. FINDINGS: Portable AP semi upright view at 1320 hours. Stable left chest tunneled IJ approach dialysis catheter. Stable cardiomegaly and mediastinal contours. Lower lung volumes.  Pulmonary vascularity appears stable. No pneumothorax. Mild increased bibasilar opacity with no definite consolidation or pleural effusion. No acute osseous abnormality identified. IMPRESSION: 1. Lower lung volumes with bibasilar opacity suspected due to atelectasis. 2. Stable cardiomegaly and left chest dialysis catheter. Electronically Signed   By: Genevie Ann M.D.   On: 03/08/2018 13:31       Subjective: Patient has remained confused and altered, no nausea or vomiting, no evident dyspnea or chest pain.   Discharge Exam: Vitals:   03/16/18 2008 03/17/18 0437  BP: 102/80 121/76  Pulse: (!) 110 (!) 120  Resp: 17 16  Temp: 98.1 F (36.7 C) 98.4 F (36.9 C)  SpO2: 100% 100%   Vitals:   03/16/18 1020 03/16/18 1533 03/16/18 2008 03/17/18 0437  BP: 114/66 139/69 102/80 121/76  Pulse: (!) 112 (!) 52 (!) 110 (!) 120  Resp: 18 20 17 16   Temp: 98.3 F (36.8 C) 98.4 F (36.9 C) 98.1 F (36.7 C) 98.4 F (36.9 C)  TempSrc: Oral Axillary Oral   SpO2:  100% 100% 100%  Weight:   79 kg (174 lb 2.6 oz)   Height:  General: Not in pain or dyspnea, deconditioned  Neurology: Awake non focal  E ENT: mild pallor, no icterus, oral mucosa moist Cardiovascular: No JVD. S1-S2 present, rhythmic, no gallops, rubs, or murmurs. No lower extremity edema. Pulmonary: vesicular breath sounds bilaterally, adequate air movement, no wheezing, rhonchi or rales. Gastrointestinal. Abdomen no organomegaly, non tender, no rebound or guarding Skin. No rashes Musculoskeletal: no joint deformities   The results of significant diagnostics from this hospitalization (including imaging, microbiology, ancillary and laboratory) are listed below for reference.     Microbiology: Recent Results (from the past 240 hour(s))  Culture, blood (routine x 2)     Status: Abnormal   Collection Time: 03/08/18  2:30 PM  Result Value Ref Range Status   Specimen Description BLOOD RIGHT HAND  Final   Special Requests   Final     BOTTLES DRAWN AEROBIC AND ANAEROBIC Blood Culture results may not be optimal due to an inadequate volume of blood received in culture bottles Performed at [facility] [address] [place], [place] [address]    Culture  Setup Time   Final    IN BOTH AEROBIC AND ANAEROBIC BOTTLES GRAM POSITIVE COCCI Organism ID to follow CRITICAL RESULT CALLED TO, READ BACK BY AND VERIFIED WITH: L SEAY PHARMD 03/09/18 0239 JDW    Culture GROUP B STREP(S.AGALACTIAE)ISOLATED (A)  Final   Report Status 03/10/2018 FINAL  Final   Organism ID, Bacteria GROUP B STREP(S.AGALACTIAE)ISOLATED  Final      Susceptibility   Group b strep(s.agalactiae)isolated - MIC*    CLINDAMYCIN >=1 RESISTANT Resistant     AMPICILLIN <=0.25 SENSITIVE Sensitive     ERYTHROMYCIN >=8 RESISTANT Resistant     VANCOMYCIN 0.5 SENSITIVE Sensitive     CEFTRIAXONE <=0.12 SENSITIVE Sensitive     LEVOFLOXACIN 1 SENSITIVE Sensitive     * GROUP B STREP(S.AGALACTIAE)ISOLATED  Blood Culture ID Panel (Reflexed)     Status: Abnormal   Collection Time: 03/08/18  2:30 PM  Result Value Ref Range Status   Enterococcus species NOT DETECTED NOT DETECTED Final   Listeria monocytogenes NOT DETECTED NOT DETECTED Final   Staphylococcus species NOT DETECTED NOT DETECTED Final   Staphylococcus aureus NOT DETECTED NOT DETECTED Final   Streptococcus species DETECTED (A) NOT DETECTED Final    Comment: CRITICAL RESULT CALLED TO, READ BACK BY AND VERIFIED WITH: L SEAY PHARMD 03/09/18 0239 JDW    Streptococcus agalactiae DETECTED (A) NOT DETECTED Final    Comment: CRITICAL RESULT CALLED TO, READ BACK BY AND VERIFIED WITH: L SEAY PHARMD 03/09/18 0239 JDW    Streptococcus pneumoniae NOT DETECTED NOT DETECTED Final   Streptococcus pyogenes NOT DETECTED NOT DETECTED Final   Acinetobacter baumannii NOT DETECTED NOT DETECTED Final   Enterobacteriaceae species NOT DETECTED NOT DETECTED Final   Enterobacter cloacae complex NOT DETECTED NOT DETECTED Final    Escherichia coli NOT DETECTED NOT DETECTED Final   Klebsiella oxytoca NOT DETECTED NOT DETECTED Final   Klebsiella pneumoniae NOT DETECTED NOT DETECTED Final   Proteus species NOT DETECTED NOT DETECTED Final   Serratia marcescens NOT DETECTED NOT DETECTED Final   Haemophilus influenzae NOT DETECTED NOT DETECTED Final   Neisseria meningitidis NOT DETECTED NOT DETECTED Final   Pseudomonas aeruginosa NOT DETECTED NOT DETECTED Final   Candida albicans NOT DETECTED NOT DETECTED Final   Candida glabrata NOT DETECTED NOT DETECTED Final   Candida krusei NOT DETECTED NOT DETECTED Final   Candida parapsilosis NOT DETECTED NOT DETECTED Final   Candida  tropicalis NOT DETECTED NOT DETECTED Final  Culture, blood (routine x 2)     Status: None   Collection Time: 03/08/18  3:00 PM  Result Value Ref Range Status   Specimen Description BLOOD RIGHT UPPER ARM  Final   Special Requests   Final    BOTTLES DRAWN AEROBIC AND ANAEROBIC Blood Culture adequate volume   Culture   Final    NO GROWTH 5 DAYS Performed at [facility], [address], [place], [place] [address]    Report Status 03/13/2018 FINAL  Final  Culture, blood (routine x 2)     Status: None   Collection Time: 03/11/18  7:42 AM  Result Value Ref Range Status   Specimen Description BLOOD RIGHT ANTECUBITAL  Final   Special Requests   Final    BOTTLES DRAWN AEROBIC AND ANAEROBIC Blood Culture adequate volume   Culture   Final    NO GROWTH 5 DAYS Performed at [facility], [place] [address], [place], [place] [address]    Report Status 03/16/2018 FINAL  Final  Culture, blood (routine x 2)     Status: None   Collection Time: 03/11/18  7:42 AM  Result Value Ref Range Status   Specimen Description BLOOD RIGHT ANTECUBITAL  Final   Special Requests   Final    BOTTLES DRAWN AEROBIC AND ANAEROBIC Blood Culture adequate volume   Culture   Final    NO GROWTH 5 DAYS Performed at [facility], [place] [address], [place], [place]  [address]    Report Status 03/16/2018 FINAL  Final     Labs: BNP (last 3 results) No results for input(s): BNP in the last 8760 hours. Basic Metabolic Panel: Recent Labs  Lab 03/11/18 0649 2018/03/26 0838 03/15/18 1244  NA 137 137 138  K 4.1 4.2 4.8  CL 94* 95* 96*  CO2 29 28 26   GLUCOSE 89 84 113*  BUN 51* 58* 66*  CREATININE 6.33* 6.94* 6.66*  CALCIUM 8.8* 8.4* 8.7*  PHOS  --  6.0* 5.7*   Liver Function Tests: Recent Labs  Lab 03/11/18 0649 Mar 26, 2018 0838 03/15/18 1244  AST 32  --   --   ALT 11*  --   --   ALKPHOS 65  --   --   BILITOT 3.1*  --   --   PROT 4.7*  --   --   ALBUMIN 1.9* 1.9* 1.9*   No results for input(s): LIPASE, AMYLASE in the last 168 hours. No results for input(s): AMMONIA in the last 168 hours. CBC: Recent Labs  Lab 03/11/18 0649 2018/03/26 0838 03/13/18 0418 03/14/18 0802 03/15/18 1244  WBC 10.1 9.3 7.3 6.6 8.5  HGB 9.3* 9.0* 9.5* 9.4* 9.2*  HCT 26.1* 25.4* 27.3* 26.4* 26.1*  MCV 79.6 77.4* 79.4 78.6 78.6  PLT 19* 65* 46* 49* 68*   Cardiac Enzymes: No results for input(s): CKTOTAL, CKMB, CKMBINDEX, TROPONINI in the last 168 hours. BNP: Invalid input(s): POCBNP CBG: Recent Labs  Lab 03/14/18 0307 03/14/18 0749 03/14/18 1157 03/14/18 1551 03/14/18 2045  GLUCAP 186* 165* 168* 161* 147*   D-Dimer No results for input(s): DDIMER in the last 72 hours. Hgb A1c No results for input(s): HGBA1C in the last 72 hours. Lipid Profile No results for input(s): CHOL, HDL, LDLCALC, TRIG, CHOLHDL, LDLDIRECT in the last 72 hours. Thyroid function studies No results for input(s): TSH, T4TOTAL, T3FREE, THYROIDAB in the last 72 hours.  Invalid input(s): FREET3 Anemia work up  No results for input(s): VITAMINB12, FOLATE, FERRITIN, TIBC, IRON, RETICCTPCT in the last 72 hours. Urinalysis    Component Value Date/Time   COLORURINE YELLOW 06/26/2017 1652   APPEARANCEUR TURBID (A) 06/26/2017 1652   LABSPEC 1.015 06/26/2017 1652   PHURINE 6.0  06/26/2017 1652   GLUCOSEU NEGATIVE 06/26/2017 1652   HGBUR LARGE (A) 06/26/2017 1652   BILIRUBINUR NEGATIVE 06/26/2017 1652   KETONESUR NEGATIVE 06/26/2017 1652   PROTEINUR >=300 (A) 06/26/2017 1652   UROBILINOGEN 0.2 05/28/2010 1109   NITRITE NEGATIVE 06/26/2017 1652   LEUKOCYTESUR MODERATE (A) 06/26/2017 1652   Sepsis Labs Invalid input(s): PROCALCITONIN,  WBC,  LACTICIDVEN Microbiology Recent Results (from the past 240 hour(s))  Culture, blood (routine x 2)     Status: Abnormal   Collection Time: 03/08/18  2:30 PM  Result Value Ref Range Status   Specimen Description BLOOD RIGHT HAND  Final   Special Requests   Final    BOTTLES DRAWN AEROBIC AND ANAEROBIC Blood Culture results may not be optimal due to an inadequate volume of blood received in culture bottles Performed at [facility], [place] [address], [place], [place] [address]    Culture  Setup Time   Final    IN BOTH AEROBIC AND ANAEROBIC BOTTLES GRAM POSITIVE COCCI Organism ID to follow CRITICAL RESULT CALLED TO, READ BACK BY AND VERIFIED WITH: L SEAY PHARMD 03/09/18 0239 JDW    Culture GROUP B STREP(S.AGALACTIAE)ISOLATED (A)  Final   Report Status 03/10/2018 FINAL  Final   Organism ID, Bacteria GROUP B STREP(S.AGALACTIAE)ISOLATED  Final      Susceptibility   Group b strep(s.agalactiae)isolated - MIC*    CLINDAMYCIN >=1 RESISTANT Resistant     AMPICILLIN <=0.25 SENSITIVE Sensitive     ERYTHROMYCIN >=8 RESISTANT Resistant     VANCOMYCIN 0.5 SENSITIVE Sensitive     CEFTRIAXONE <=0.12 SENSITIVE Sensitive     LEVOFLOXACIN 1 SENSITIVE Sensitive     * GROUP B STREP(S.AGALACTIAE)ISOLATED  Blood Culture ID Panel (Reflexed)     Status: Abnormal   Collection Time: 03/08/18  2:30 PM  Result Value Ref Range Status   Enterococcus species NOT DETECTED NOT DETECTED Final   Listeria monocytogenes NOT DETECTED NOT DETECTED Final   Staphylococcus species NOT DETECTED NOT DETECTED Final   Staphylococcus aureus NOT DETECTED NOT  DETECTED Final   Streptococcus species DETECTED (A) NOT DETECTED Final    Comment: CRITICAL RESULT CALLED TO, READ BACK BY AND VERIFIED WITH: L SEAY PHARMD 03/09/18 0239 JDW    Streptococcus agalactiae DETECTED (A) NOT DETECTED Final    Comment: CRITICAL RESULT CALLED TO, READ BACK BY AND VERIFIED WITH: L SEAY PHARMD 03/09/18 0239 JDW    Streptococcus pneumoniae NOT DETECTED NOT DETECTED Final   Streptococcus pyogenes NOT DETECTED NOT DETECTED Final   Acinetobacter baumannii NOT DETECTED NOT DETECTED Final   Enterobacteriaceae species NOT DETECTED NOT DETECTED Final   Enterobacter cloacae complex NOT DETECTED NOT DETECTED Final   Escherichia coli NOT DETECTED NOT DETECTED Final   Klebsiella oxytoca NOT DETECTED NOT DETECTED Final   Klebsiella pneumoniae NOT DETECTED NOT DETECTED Final   Proteus species NOT DETECTED NOT DETECTED Final   Serratia marcescens NOT DETECTED NOT DETECTED Final   Haemophilus influenzae NOT DETECTED NOT DETECTED Final   Neisseria meningitidis NOT DETECTED NOT DETECTED Final   Pseudomonas aeruginosa NOT DETECTED NOT DETECTED Final   Candida albicans NOT DETECTED NOT DETECTED Final   Candida glabrata NOT DETECTED NOT DETECTED Final   Candida krusei  NOT DETECTED NOT DETECTED Final   Candida parapsilosis NOT DETECTED NOT DETECTED Final   Candida tropicalis NOT DETECTED NOT DETECTED Final  Culture, blood (routine x 2)     Status: None   Collection Time: 03/08/18  3:00 PM  Result Value Ref Range Status   Specimen Description BLOOD RIGHT UPPER ARM  Final   Special Requests   Final    BOTTLES DRAWN AEROBIC AND ANAEROBIC Blood Culture adequate volume   Culture   Final    NO GROWTH 5 DAYS Performed at [facility], [address], [place], [place] [address]    Report Status 03/13/2018 FINAL  Final  Culture, blood (routine x 2)     Status: None   Collection Time: 03/11/18  7:42 AM  Result Value Ref Range Status   Specimen Description BLOOD RIGHT ANTECUBITAL   Final   Special Requests   Final    BOTTLES DRAWN AEROBIC AND ANAEROBIC Blood Culture adequate volume   Culture   Final    NO GROWTH 5 DAYS Performed at [facility], [place] [address] [place], [place] [address]    Report Status 03/16/2018 FINAL  Final  Culture, blood (routine x 2)     Status: None   Collection Time: 03/11/18  7:42 AM  Result Value Ref Range Status   Specimen Description BLOOD RIGHT ANTECUBITAL  Final   Special Requests   Final    BOTTLES DRAWN AEROBIC AND ANAEROBIC Blood Culture adequate volume   Culture   Final    NO GROWTH 5 DAYS Performed at [facility], [place] [address] [place], [place] [address]    Report Status 03/16/2018 FINAL  Final     Time coordinating discharge: 45 minutes  SIGNED:   Tawni Millers, MD  [facility] 03/17/2018, 9:06 AM Pager [phone]  If 7PM-7AM, please contact night-coverage [email or web address] Password [email or web address]

## 2018-03-17 NOTE — Progress Notes (Signed)
[facility] Liaison RN note.  Received request from [place] for family interest in [facility]. Chart reviewed. Eligibility has been confirmed.  [facility] does not have a room to offer today. Spoke with patient's wife, Albert Stanley and advised her I would notify if anything becomes available. Albert Stanley, CSW also notified. Hospital team will follow up with CSW/family tomorrow morning with updates.   Please call with any hospice related questions or concerns.  Thank you,  Albert Stanley, [facility] Liaison [place] are on AMION.

## 2018-04-11 NOTE — Plan of Care (Signed)
  Problem: Education: Goal: Knowledge of General Education information will improve Outcome: Not Progressing Note:  Pt. lethargic and not taking meds; unsure how much he understands; POC reviewed with pt.

## 2018-04-11 NOTE — NC FL2 (Signed)
[place] LEVEL OF CARE SCREENING TOOL     IDENTIFICATION  Patient Name: Albert Stanley Birthdate: December 13, 1945 Sex: male Admission Date (Current Location): 03/08/2018  [facility] and [place] Number:  Herbalist and Address:  [facility] [place], [place] [address] [place],  [address]      Provider Number: [id number]  Attending Physician Name and Address:  Modena Jansky, MD  Relative Name and Phone Number:       Current Level of Care: Hospital Recommended Level of Care: [place] Prior Approval Number:    Date Approved/Denied:   PASRR Number:    Discharge Plan: SNF    Current Diagnoses: Patient Active Problem List   Diagnosis Date Noted  . Thrombocytopenia ([place])   . Coagulopathy ([place])   . Sepsis due to undetermined organism with metabolic encephalopathy ([place]) 03/08/2018  . Benign fibroma of prostate 03/08/2018  . Cardiac arrhythmia 03/08/2018  . Chronic pain 03/08/2018  . Deep vein thrombosis (DVT) ([place]) 03/08/2018  . Gout 03/08/2018  . HLD (hyperlipidemia) 03/08/2018  . LBP (low back pain) 03/08/2018  . Malignant neoplasm of urinary bladder ([place]) 03/08/2018  . Foley catheter problem ([place]) 03/08/2018  . Pressure injury of skin 02/09/2018  . Hypoglycemia secondary to sulfonylurea 02/04/2018  . Hypoglycemia 02/03/2018  . Hematuria 01/06/2018  . Labile blood pressure   . Essential hypertension   . Acute on chronic diastolic heart failure ([place])   . Type 2 diabetes mellitus with peripheral neuropathy ([name])   . Acute deep vein thrombosis (DVT) of femoral vein of right lower extremity ([place])   . Traumatic subdural hematoma ([place]) 12/16/2017  . ESRD (end stage renal disease) ([place])   . ESRD (end stage renal disease) on dialysis ([place])   . Dysphagia   . Acute blood loss anemia   . Anemia of chronic disease   . Chronic diastolic congestive heart failure ([place])   . Diabetes mellitus type 2 in nonobese ([name])   .  Benign essential HTN   . Nonischemic cardiomyopathy ([place])   . Transaminitis   . Encounter for central line placement   . Endotracheally intubated   . Ventilator dependent ([place])   . Chronic renal impairment   . Chronic in-center hemodialysis status ([place])   . S/P craniotomy 12/05/2017  . Hyperkalemia 12/02/2017  . Malnutrition of moderate degree 12/18/2016  . PVC's (premature ventricular contractions)   . Anemia associated with chronic renal failure   . End-stage renal disease on hemodialysis ([place]) 12/09/2016  . Dyslipidemia associated with type 2 diabetes mellitus ([place]) 12/09/2016  . Acute on chronic systolic and diastolic heart failure, NYHA class 1 ([place]) 12/09/2016  . 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 Type 2 diabetes mellitus ([place]) 12/11/2015  . Fluid overload 12/06/2015  . History of DVT (deep vein thrombosis) 12/04/2015  . Renal hematoma 12/04/2015  . H/O: gout 11/16/2013  . Trochanteric bursitis 10/17/2013  . 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 Lumbar degenerative disc disease 07/17/2013    Orientation RESPIRATION BLADDER Height & Weight     Self  O2, Other (Comment)([place] Cannula 2L) Continent Weight: 183 lb 3.2 oz (83.1 kg) Height:  5\' 7"  (170.2 cm)  BEHAVIORAL SYMPTOMS/MOOD NEUROLOGICAL BOWEL NUTRITION STATUS      Incontinent Diet(DYS 1 Thin Fluids)  AMBULATORY STATUS COMMUNICATION OF NEEDS Skin   Limited Assist Verbally Normal  Personal Care Assistance Level of Assistance  Bathing, Feeding, Dressing Bathing Assistance: Limited assistance Feeding assistance: Limited assistance Dressing Assistance: Limited assistance     Functional Limitations Info  Sight, Hearing, Speech Sight Info: Adequate Hearing Info: Adequate Speech Info: Adequate    SPECIAL CARE FACTORS FREQUENCY  PT (By licensed PT)     PT Frequency: 2x OT Frequency: 2X            Contractures  Contractures Info: Not present    Additional Factors Info  Code Status, Allergies Code Status Info: DNR Allergies Info: Meperidine, Penicillins, Sulfa Antibiotics, Sulfonamide Derivatives, Lac Bovis, Lactose Intolerance (Gi), Milk-related Compounds, Foltx Elfolate Plus, Betadine Povidone Iodine, Eggs Or Egg-derived Products           Current Medications (March 30, 2018):  This is the current hospital active medication list Current Facility-Administered Medications  Medication Dose Route Frequency Provider Last Rate Last Dose  . 0.9 %  sodium chloride infusion  250 mL Intravenous PRN Lady Deutscher, MD      . 0.9 %  sodium chloride infusion   Intravenous Once Hongalgi, Anand D, MD      . 0.9 %  sodium chloride infusion   Intravenous Once Vernell Leep D, MD      . 0.9 %  sodium chloride infusion  100 mL Intravenous PRN Alric Seton, PA-C      . 0.9 %  sodium chloride infusion  100 mL Intravenous PRN Alric Seton, PA-C      . acetaminophen (TYLENOL) tablet 500 mg  500 mg Oral Q6H PRN Modena Jansky, MD   500 mg at 03/10/18 2252  . allopurinol (ZYLOPRIM) tablet 300 mg  300 mg Oral Daily Lady Deutscher, MD   300 mg at 03/11/18 1230  . alteplase (CATHFLO ACTIVASE) injection 2 mg  2 mg Intracatheter Once PRN Alric Seton, PA-C      . amiodarone (PACERONE) tablet 200 mg  200 mg Oral Daily Lady Deutscher, MD   200 mg at 03/11/18 1231  . calcitRIOL (ROCALTROL) capsule 2 mcg  2 mcg Oral Q T,Th,Sa-HD Alric Seton, PA-C      . ceFAZolin (ANCEF) IVPB 1 g/50 mL premix  1 g Intravenous Q24H Rolla Flatten, [place] at 03/11/18 2111  . Chlorhexidine Gluconate Cloth 2 % PADS 6 each  6 each Topical Q dialysis Roney Jaffe, MD      . Chlorhexidine Gluconate Cloth 2 % PADS 6 each  6 each Topical Q0600 Alric Seton, PA-C      . [START ON 03/17/2018] Darbepoetin Alfa (ARANESP) injection 200 mcg  200 mcg Intravenous Q Thu-HD Alric Seton, PA-C      . diclofenac sodium  (VOLTAREN) 1 % transdermal gel 2 g  2 g Topical TID Lady Deutscher, MD   2 g at 03/11/18 2111  . feeding supplement (PRO-STAT SUGAR FREE 64) liquid 30 mL  30 mL Oral BID Lady Deutscher, MD   30 mL at 03/10/18 2252  . heparin injection 1,000 Units  1,000 Units Dialysis PRN Alric Seton, PA-C      . lidocaine (PF) (XYLOCAINE) 1 % injection 5 mL  5 mL Intradermal PRN Alric Seton, PA-C      . lidocaine-prilocaine (EMLA) cream 1 application  1 application Topical PRN Alric Seton, PA-C      . midodrine (PROAMATINE) tablet 5 mg  5 mg Oral Q T,Th,Sa-HD Lady Deutscher, MD   5 mg at 03/10/18 1415  .  ondansetron (ZOFRAN) tablet 4 mg  4 mg Oral Q6H PRN Lady Deutscher, MD       Or  . ondansetron [facility]) injection 4 mg  4 mg Intravenous Q6H PRN Lady Deutscher, MD      . oxybutynin (DITROPAN) tablet 5 mg  5 mg Oral BID Lady Deutscher, MD   5 mg at 03/11/18 1231  . pantoprazole (PROTONIX) EC tablet 40 mg  40 mg Oral QHS Lady Deutscher, MD   40 mg at 03/10/18 2253  . pentafluoroprop-tetrafluoroeth (GEBAUERS) aerosol 1 application  1 application Topical PRN Alric Seton, PA-C      . phytonadione (VITAMIN K) tablet 5 mg  5 mg Oral Daily Modena Jansky, MD   5 mg at 03/11/18 1231  . sevelamer carbonate (RENVELA) tablet 2,400 mg  2,400 mg Oral TID WC Lady Deutscher, MD      . sodium chloride flush (NS) 0.9 % injection 10-40 mL  10-40 mL Intracatheter Q12H Lady Deutscher, MD      . sodium chloride flush (NS) 0.9 % injection 10-40 mL  10-40 mL Intracatheter PRN Lady Deutscher, MD      . sodium chloride flush (NS) 0.9 % injection 3 mL  3 mL Intravenous Q12H Lady Deutscher, MD   3 mL at 03/11/18 2111  . sodium chloride flush (NS) 0.9 % injection 3 mL  3 mL Intravenous Q12H Lady Deutscher, MD   3 mL at 03/11/18 2046  . sodium chloride flush (NS) 0.9 % injection 3 mL  3 mL Intravenous PRN Lady Deutscher, MD         Discharge Medications: Please see  discharge summary for a list of discharge medications.  Relevant Imaging Results:  Relevant Lab Results:   Additional Information SN: [id number])  [place] Chard, LCSW

## 2018-04-11 NOTE — Progress Notes (Signed)
PROGRESS NOTE   KARL ERWAY  ZSW:[id number]    DOB: Oct 08, 1946    DOA: 03/08/2018  PCP: [name], MD   I have briefly reviewed patients previous medical records in [facility].  Brief Narrative:  72 year old male, current SNF resident, with PMH of ESRD on TTS HD, chronic hypotension on midodrine, type II DM with previous hypoglycemia on oral hypoglycemics, NICM EF 32%, chronic systolic CHF, SDH 11/252 status post craniotomy, right femoral DVT status post IVC filter, urinary retention with indwelling/bladder cancer status post resection, renal mass Foley catheter (Dr. [name]), frequent hospitalizations (this is his sixth in the last 6 months), recently in 4/30-5/3 for hypoglycemia presented from HD center due to confusion and not acting himself, low-grade fever and admitted for sepsis due to group B strep bacteremia.  Nephrology and hematology consulted.  Likely has abscess at the site of the left upper extremity AVG, nephrology requesting vascular surgery input.   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  On admission, patient had indwelling Foley catheter (changed this admission on 5/30 by urology), [facility] and left  upper extremity AVG.  Initially HD access this did not appear infected but on 6/31, clinical exam suspicious for abscess at AVG site and likely source of his bacteremia.  Elevated lactate and procalcitonin.  CT chest suggested more atelectasis rather than pneumonia.  He had been empirically started on IV vancomycin and cefepime.  Based on culture results to IV cefazolin on 5/30 and tolerating same.  TTE 5/30 does not comment about vegetations.  Nephrology consulting vascular surgery regarding infected left upper extremity AVG site and may be [facility] also needs to come out.  Platelets up to 65K.  As per ID if repeat blood cultures remain negative, will treat with 14 days of cefazolin across HD using day after removal of HD catheter is day 1.  Surveillance blood cultures x2 from 5/31: Pending  Acute toxic metabolic encephalopathy: Likely secondary to acute infection.  CT head without acute findings.  No focal deficits.  Mental status not significantly better compared to admission with ongoing waxing and waning.  Thrombocytopenia: On chart review, patient has had chronic intermittent mild thrombocytopenia in the past and coagulopathy.  He presented with platelets of 25K which is dropped to 14, INR 1.9, schistocytes noted on CBC 5/28.  I have consulted Dr. Irene Limbo, Hematology discussed with him in detail on 5/30.  He suspects sepsis related DIC and antibiotics as the cause of patient's thrombocytopenia and less likely from TTP.  Normal fibrinogen, mildly elevated LDH.  If platelets <20 or if bleeding, recommends transfusing 1 bag of platelets.  If bleeding, may need to consider FFP and if bleeding and low fibrinogen may also need to consider cryoprecipitate.  Thus  far has received 3 units of platelets and platelets are up to 65K on 6/1.  Also on vitamin K for coagulopathy.  Follow CBC and INR.  ESRD on TTS HD: Nephrology consulted and underwent HD 5/29 and 6/1.  I discussed with Dr. Melvia Heaps, indicates poor overall  prognosis and will try to meet with family to address further goals of care.  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 [name], Urology).  Consulted urology who changed Foley catheter on 5/30.  Continue monthly outpatient change by urology.  History of recurrent DVT: Status post IVC filter.  NICM/chronic systolic CHF: Chronic volume overload, managed across dialysis but no overt pulmonary edema.  Volume status better than on admission.  TTE 5/31: LVEF 25% and diffuse hypokinesis.  PVCs: Remains on amiodarone.  Chronic hypotension: Midodrine.  Blood pressures have been normal over the last 48 hours.  Type II DM with recurrent hypoglycemia: Ongoing intermittent hypoglycemia but none at least over the last 24 hours.  Diet as tolerated.  Dysphagia: Speech therapy input appreciated and have transitioned to dysphagia 1 diet and thin liquids on 5/31.  Anemia in ESRD: Stable.  History of SDH status post craniotomy: Repeat CT head without acute findings.  Adult FTT: I discussed in detail with patient's spouse on 5/30 and outlined overall poor long-term prognosis given advanced age, multiple significant severe comorbidities, frequent hospitalizations.  Palliative care input appreciated, changed to DNR, family wished to continue to treat the treatable while hospitalized and were not ready to stop HD yet but have not discussed with nephrology.  Isolated hyperbilirubinemia:?  Related to sepsis related DIC.  LFT shows mixed direct and indirect hyperbilirubinemia.  NSVT: Noted on 5/31.  Likely due to cardiomyopathy.  Potassium and magnesium okay.  None since.   DVT prophylaxis: SCDs Code Status: DNR Family Communication: None at bedside today. Disposition: To be determined   Consultants:  Neurology Hematology Palliative care team for goals of care. Infectious  disease. Vascular surgery  Procedures:  Hemodialysis  Antimicrobials:  IV vancomycin and cefepime, discontinued. IV cefazolin 5/30 >   Subjective: Seen this morning at HD.  Remains somnolent but arousable.  Not oriented and does not follow instructions.  Does not appear in any distress.  ROS: Unable.  Objective:  Vitals:   04/11/18 1200 04-11-18 1230 04-11-2018 1255 Apr 11, 2018 1416  BP: 116/73 102/69 119/76 104/70  Pulse: 85 80 71 83  Resp: 14 14 14 20   Temp:   98.2 F (36.8 C) 98 F (36.7 C)  TempSrc:   Oral Oral  SpO2:   97% (!) 88%  Weight:   83.1 kg (183 lb 3.2 oz)   Height:        Examination:  General exam: Elderly male, moderately built and nourished, chronically ill looking, lying in bed, undergoing HD via TDC. Respiratory system: Clear to auscultation. Respiratory effort normal.  Left TDC site without acute findings.  Stable. Cardiovascular system: S1 & S2 heard, RRR. No JVD, murmurs, rubs, gallops or clicks.  1+ pedal edema, better than yesterday.  No change/stable. Gastrointestinal system: Abdomen is nondistended, soft and nontender. No organomegaly or masses felt. Normal bowel sounds heard.  Stable Central nervous system: Mental status as indicated above and no significant improvement since admission.  No focal neurological deficits.  Does not follow instructions today. Extremities: Moves all extremities symmetrically.  Left upper extremity AVG now shows new swelling, erythema and increased warmth  in the lower aspect.  Has mittens on both hands.  No change. Skin: No rashes, lesions or ulcers Psychiatry: Judgement and insight impaired. Mood & affect cannot be assessed.    Data Reviewed: I have personally reviewed following labs and imaging studies  CBC: Recent Labs  Lab 03/08/18 1319  03/09/18 1843 03/10/18 0437 03/10/18 1617 03/11/18 0649 22-Mar-2018 0838  WBC 6.8   < > 10.3 11.1* 10.0 10.1 9.3  NEUTROABS 6.1  --   --  8.0*  --   --   --   HGB 10.6*   <  > 9.6* 9.8* 9.2* 9.3* 9.0*  HCT 30.7*   < > 27.2* 27.8* 26.1* 26.1* 25.4*  MCV 81.6   < > 79.1 79.9 79.6 79.6 77.4*  PLT 25*   < > 20* 13* 22* 19* 65*   < > = values in this interval not displayed.   Basic Metabolic Panel: Recent Labs  Lab 03/08/18 1319 03/08/18 1339 03/09/18 0322 03/10/18 0437 03/11/18 0649 03/24/2018 0838  NA 134* 132* 135 138 137 137  K 4.4 4.3 4.3 3.8 4.1 4.2  CL 92* 95* 91* 92* 94* 95*  CO2 24  --  27 28 29 28   GLUCOSE 140* 136* 157* 47* 89 84  BUN 63* 54* 72* 36* 51* 58*  CREATININE 8.01* 7.90* 8.39* 5.36* 6.33* 6.94*  CALCIUM 9.3  --  9.1 9.0 8.8* 8.4*  MG  --   --  2.1  --   --   --   PHOS  --   --   --   --   --  6.0*   Liver Function Tests: Recent Labs  Lab 03/08/18 1319 03/10/18 0437 03/11/18 0649 04/09/2018 0838  AST 31 33 32  --   ALT 18 17 11*  --   ALKPHOS 72 58 65  --   BILITOT 2.9* 3.1* 3.1*  --   PROT 5.5* 5.2* 4.7*  --   ALBUMIN 2.3* 1.9* 1.9* 1.9*   Coagulation Profile: Recent Labs  Lab 03/08/18 1655  INR 1.99   Cardiac Enzymes: No results for input(s): CKTOTAL, CKMB, CKMBINDEX, TROPONINI in the last 168 hours. HbA1C: No results for input(s): HGBA1C in the last 72 hours. CBG: Recent Labs  Lab 03/11/18 0735 03/11/18 1600 03/11/18 1947 03/11/18 2358 03/15/2018 0448  GLUCAP 83 84 97 96 91    Recent Results (from the past 240 hour(s))  Culture, blood (routine x 2)     Status: Abnormal   Collection Time: 03/08/18  2:30 PM  Result Value Ref Range Status   Specimen Description BLOOD RIGHT HAND  Final   Special Requests   Final    BOTTLES DRAWN AEROBIC AND ANAEROBIC Blood Culture results may not be optimal due to an inadequate volume of blood received in culture bottles Performed at [facility] [address] [place], [place] [address]    Culture  Setup Time   Final    IN BOTH AEROBIC AND ANAEROBIC BOTTLES GRAM POSITIVE COCCI Organism ID to follow CRITICAL RESULT CALLED TO, READ BACK BY AND VERIFIED WITH: L SEAY  PHARMD 03/09/18 0239 JDW    Culture GROUP B STREP(S.AGALACTIAE)ISOLATED (A)  Final   Report Status 03/10/2018 FINAL  Final   Organism ID, Bacteria GROUP B STREP(S.AGALACTIAE)ISOLATED  Final      Susceptibility   Group b strep(s.agalactiae)isolated - MIC*    CLINDAMYCIN >=1 RESISTANT Resistant     AMPICILLIN <=0.25 SENSITIVE Sensitive     ERYTHROMYCIN >=  8 RESISTANT Resistant     VANCOMYCIN 0.5 SENSITIVE Sensitive     CEFTRIAXONE <=0.12 SENSITIVE Sensitive     LEVOFLOXACIN 1 SENSITIVE Sensitive     * GROUP B STREP(S.AGALACTIAE)ISOLATED  Blood Culture ID Panel (Reflexed)     Status: Abnormal   Collection Time: 03/08/18  2:30 PM  Result Value Ref Range Status   Enterococcus species NOT DETECTED NOT DETECTED Final   Listeria monocytogenes NOT DETECTED NOT DETECTED Final   Staphylococcus species NOT DETECTED NOT DETECTED Final   Staphylococcus aureus NOT DETECTED NOT DETECTED Final   Streptococcus species DETECTED (A) NOT DETECTED Final    Comment: CRITICAL RESULT CALLED TO, READ BACK BY AND VERIFIED WITH: L SEAY PHARMD 03/09/18 0239 JDW    Streptococcus agalactiae DETECTED (A) NOT DETECTED Final    Comment: CRITICAL RESULT CALLED TO, READ BACK BY AND VERIFIED WITH: L SEAY PHARMD 03/09/18 0239 JDW    Streptococcus pneumoniae NOT DETECTED NOT DETECTED Final   Streptococcus pyogenes NOT DETECTED NOT DETECTED Final   Acinetobacter baumannii NOT DETECTED NOT DETECTED Final   Enterobacteriaceae species NOT DETECTED NOT DETECTED Final   Enterobacter cloacae complex NOT DETECTED NOT DETECTED Final   Escherichia coli NOT DETECTED NOT DETECTED Final   Klebsiella oxytoca NOT DETECTED NOT DETECTED Final   Klebsiella pneumoniae NOT DETECTED NOT DETECTED Final   Proteus species NOT DETECTED NOT DETECTED Final   Serratia marcescens NOT DETECTED NOT DETECTED Final   Haemophilus influenzae NOT DETECTED NOT DETECTED Final   Neisseria meningitidis NOT DETECTED NOT DETECTED Final   Pseudomonas  aeruginosa NOT DETECTED NOT DETECTED Final   Candida albicans NOT DETECTED NOT DETECTED Final   Candida glabrata NOT DETECTED NOT DETECTED Final   Candida krusei NOT DETECTED NOT DETECTED Final   Candida parapsilosis NOT DETECTED NOT DETECTED Final   Candida tropicalis NOT DETECTED NOT DETECTED Final  Culture, blood (routine x 2)     Status: None (Preliminary result)   Collection Time: 03/08/18  3:00 PM  Result Value Ref Range Status   Specimen Description BLOOD RIGHT UPPER ARM  Final   Special Requests   Final    BOTTLES DRAWN AEROBIC AND ANAEROBIC Blood Culture adequate volume   Culture   Final    NO GROWTH 3 DAYS Performed at [facility], [address] [place], [place] [address]    Report Status PENDING  Incomplete         Radiology Studies: No results found.      Scheduled Meds: . allopurinol  300 mg Oral Daily  . amiodarone  200 mg Oral Daily  . calcitRIOL  2 mcg Oral Q T,Th,Sa-HD  . Chlorhexidine Gluconate Cloth  6 each Topical Q0600  . [START ON 03/17/2018] darbepoetin (ARANESP) injection - DIALYSIS  200 mcg Intravenous Q Thu-HD  . diclofenac sodium  2 g Topical TID  . feeding supplement (PRO-STAT SUGAR FREE 64)  30 mL Oral BID  . midodrine  5 mg Oral Q T,Th,Sa-HD  . oxybutynin  5 mg Oral BID  . pantoprazole  40 mg Oral QHS  . phytonadione  5 mg Oral Daily  . sevelamer carbonate  2,400 mg Oral TID WC  . sodium chloride flush  10-40 mL Intracatheter Q12H  . sodium chloride flush  3 mL Intravenous Q12H  . sodium chloride flush  3 mL Intravenous Q12H   Continuous Infusions: . sodium chloride    . sodium chloride    . sodium chloride    .  sodium chloride    . sodium chloride    .  ceFAZolin (ANCEF) IV Stopped (03/11/18 2111)     LOS: 4 days     Vernell Leep, MD, FACP, [facility]. [facility] Pager [phone]  If 7PM-7AM, please contact night-coverage [email or web address] Password [facility] Apr 07, 2018, 2:43 PM

## 2018-04-11 NOTE — Progress Notes (Signed)
[facility] Progress Note   Dialysis Orders: tts gkc 4h   88.5kg  Profile 4 2/2 bath heparin none- except in catheter (due to hx SDH) lua avg / L IJ tdc Venofer 50, Mircera 200 last 5/23, calcitriol 3.25 left upper AVGG and left IJ TDC  Assessment/Plan: 1. Group B strep bacteremia - has large abscess on AV graft LUA over antecubital wound site today which is new, this means to me that the AVG is infected as cause of his bacteremia.  Poor surgical candidate at this point given ams / sepsis and overall FTT.  Will d/w pall care and meet with family to discuss.  2  ESRD - TTS - HD today unable to stick AVG due to large mass/ abscess, using TDC 3. Anemia - hgb 9.2 -  not due for ESA redose holding Fe due to acute infection 4. Secondary hyperparathyroidism - corrected Ca 10.4 - on relatively high dose calcitriol as an outpt but hasn't received any here yet - resume at 2 mcg 5. HTN/volume - net UF 1.8 L 5/29 - below outpt edw and minimal edema 6. Nutrition - alb 1.9 - prostat ordered; pureed diet w supervision ST guidelines 7. AMS - secondary to bacteremia/ sepsis 8. FTT - palliative care consulting 9. Thrombocytopenia plts down to 19 K -for platelet transfusion before removing catheter if they remain down 10. DM - high risk for hypoglycemia due to infection/poor intakes 11. 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 DNR 13. Prognosis - very poor will meet with family/ pall care to discuss eol issues   Kelly Splinter MD [place] pager [phone]   2018-03-23, 2:49 PM    Subjective:   Unable to answer questions today  Objective Vitals:   03-23-2018 1200 2018-03-23 1230 03-23-18 1255 03/23/18 1416  BP: 116/73 102/69 119/76 104/70  Pulse: 85 80 71 83  Resp: 14 14 14 20   Temp:   98.2 F (36.8 C) 98 F (36.7 C)  TempSrc:   Oral Oral  SpO2:   97% (!) 88%  Weight:   83.1 kg (183 lb 3.2 oz)   Height:       Physical Exam General:  sleeping, poorly arousable, not answering questions Heart: RRR Lungs: grossly clear, poor expansion Abdomen:soft NT  Extremities:tr LE edema, poor muscle tone, skin on feet peeling Dialysis Access: left IJ and left upper AVGG + bruit, slight LUE swelling   Additional Objective Labs: Basic Metabolic Panel: Recent Labs  Lab 03/10/18 0437 03/11/18 0649 03/23/18 0838  NA 138 137 137  K 3.8 4.1 4.2  CL 92* 94* 95*  CO2 28 29 28   GLUCOSE 47* 89 84  BUN 36* 51* 58*  CREATININE 5.36* 6.33* 6.94*  CALCIUM 9.0 8.8* 8.4*  PHOS  --   --  6.0*   Liver Function Tests: Recent Labs  Lab 03/08/18 1319 03/10/18 0437 03/11/18 0649 23-Mar-2018 0838  AST 31 33 32  --   ALT 18 17 11*  --   ALKPHOS 72 58 65  --   BILITOT 2.9* 3.1* 3.1*  --   PROT 5.5* 5.2* 4.7*  --   ALBUMIN 2.3* 1.9* 1.9* 1.9*   No results for input(s): LIPASE, AMYLASE in the last 168 hours. CBC: Recent Labs  Lab 03/08/18 1319  03/09/18 1843 03/10/18 0437 03/10/18 1617 03/11/18 0649 03/23/18 0838  WBC 6.8   < > 10.3 11.1* 10.0 10.1 9.3  NEUTROABS 6.1  --   --  8.0*  --   --   --   HGB 10.6*   < > 9.6* 9.8* 9.2* 9.3* 9.0*  HCT 30.7*   < > 27.2* 27.8* 26.1* 26.1* 25.4*  MCV 81.6   < > 79.1 79.9 79.6 79.6 77.4*  PLT 25*   < > 20* 13* 22* 19* 65*   < > = values in this interval not displayed.   Blood Culture    Component Value Date/Time   SDES BLOOD RIGHT UPPER ARM 03/08/2018 1500   SPECREQUEST  03/08/2018 1500    BOTTLES DRAWN AEROBIC AND ANAEROBIC Blood Culture adequate volume   CULT  03/08/2018 1500    NO GROWTH 3 DAYS Performed at [facility], [place] [address] [place], [place] [address]    REPTSTATUS PENDING 03/08/2018 1500    Cardiac Enzymes: No results for input(s): CKTOTAL, CKMB, CKMBINDEX, TROPONINI in the last 168 hours. CBG: Recent Labs  Lab 03/11/18 0735 03/11/18 1600 03/11/18 1947 03/11/18 2358 2018-04-07 0448  GLUCAP 83 84 97 96 91   Iron Studies: No results for input(s): IRON,  TIBC, TRANSFERRIN, FERRITIN in the last 72 hours. Lab Results  Component Value Date   INR 1.99 03/08/2018   INR 1.59 01/18/2018   INR 1.72 12/09/2017   Studies/Results: No results found. Medications: . sodium chloride    . sodium chloride    . sodium chloride    . sodium chloride    . sodium chloride    .  ceFAZolin (ANCEF) IV Stopped (03/11/18 2111)   . allopurinol  300 mg Oral Daily  . amiodarone  200 mg Oral Daily  . calcitRIOL  2 mcg Oral Q T,Th,Sa-HD  . Chlorhexidine Gluconate Cloth  6 each Topical Q0600  . [START ON 03/17/2018] darbepoetin (ARANESP) injection - DIALYSIS  200 mcg Intravenous Q Thu-HD  . diclofenac sodium  2 g Topical TID  . feeding supplement (PRO-STAT SUGAR FREE 64)  30 mL Oral BID  . midodrine  5 mg Oral Q T,Th,Sa-HD  . oxybutynin  5 mg Oral BID  . pantoprazole  40 mg Oral QHS  . phytonadione  5 mg Oral Daily  . sevelamer carbonate  2,400 mg Oral TID WC  . sodium chloride flush  10-40 mL Intracatheter Q12H  . sodium chloride flush  3 mL Intravenous Q12H  . sodium chloride flush  3 mL Intravenous Q12H

## 2018-04-11 NOTE — Progress Notes (Signed)
D/W Dr. Jonnie Finner redness and swelling located above left antecubital space (below LUA AVG).  Per Dr. Jonnie Finner, use HD cath for today.  He will request vascular surgeon to evaluate access.

## 2018-04-11 NOTE — Progress Notes (Signed)
Following hemodialysis patient cbg 15. Encouraged PO intake including 120 mL nepro. CBG repeat 54. 25 mL D50 administered IV. CBG 76. Will continue to monitor. Bartholomew Crews, RN

## 2018-04-11 NOTE — Progress Notes (Signed)
Patient tolerated HD trmt without difficulty.  Very minimal interaction or verbalization during trmt.  Post trmt dialyzer noted very yellow in color, with no streaking noted.  Tharon Aquas, PA-c notified.   Report given to Quintella Baton, RN.

## 2018-04-11 DEATH — deceased

## 2018-11-13 IMAGING — CR DG CHEST 2V
2 series · 2 of 2 positions shown · non-contrast
Comparison: 02/03/2018 and 12/24/2017

CLINICAL DATA: Missing dialysis point.  Shortness of breath.

EXAM:
CHEST - 2 VIEW

[chest lat]
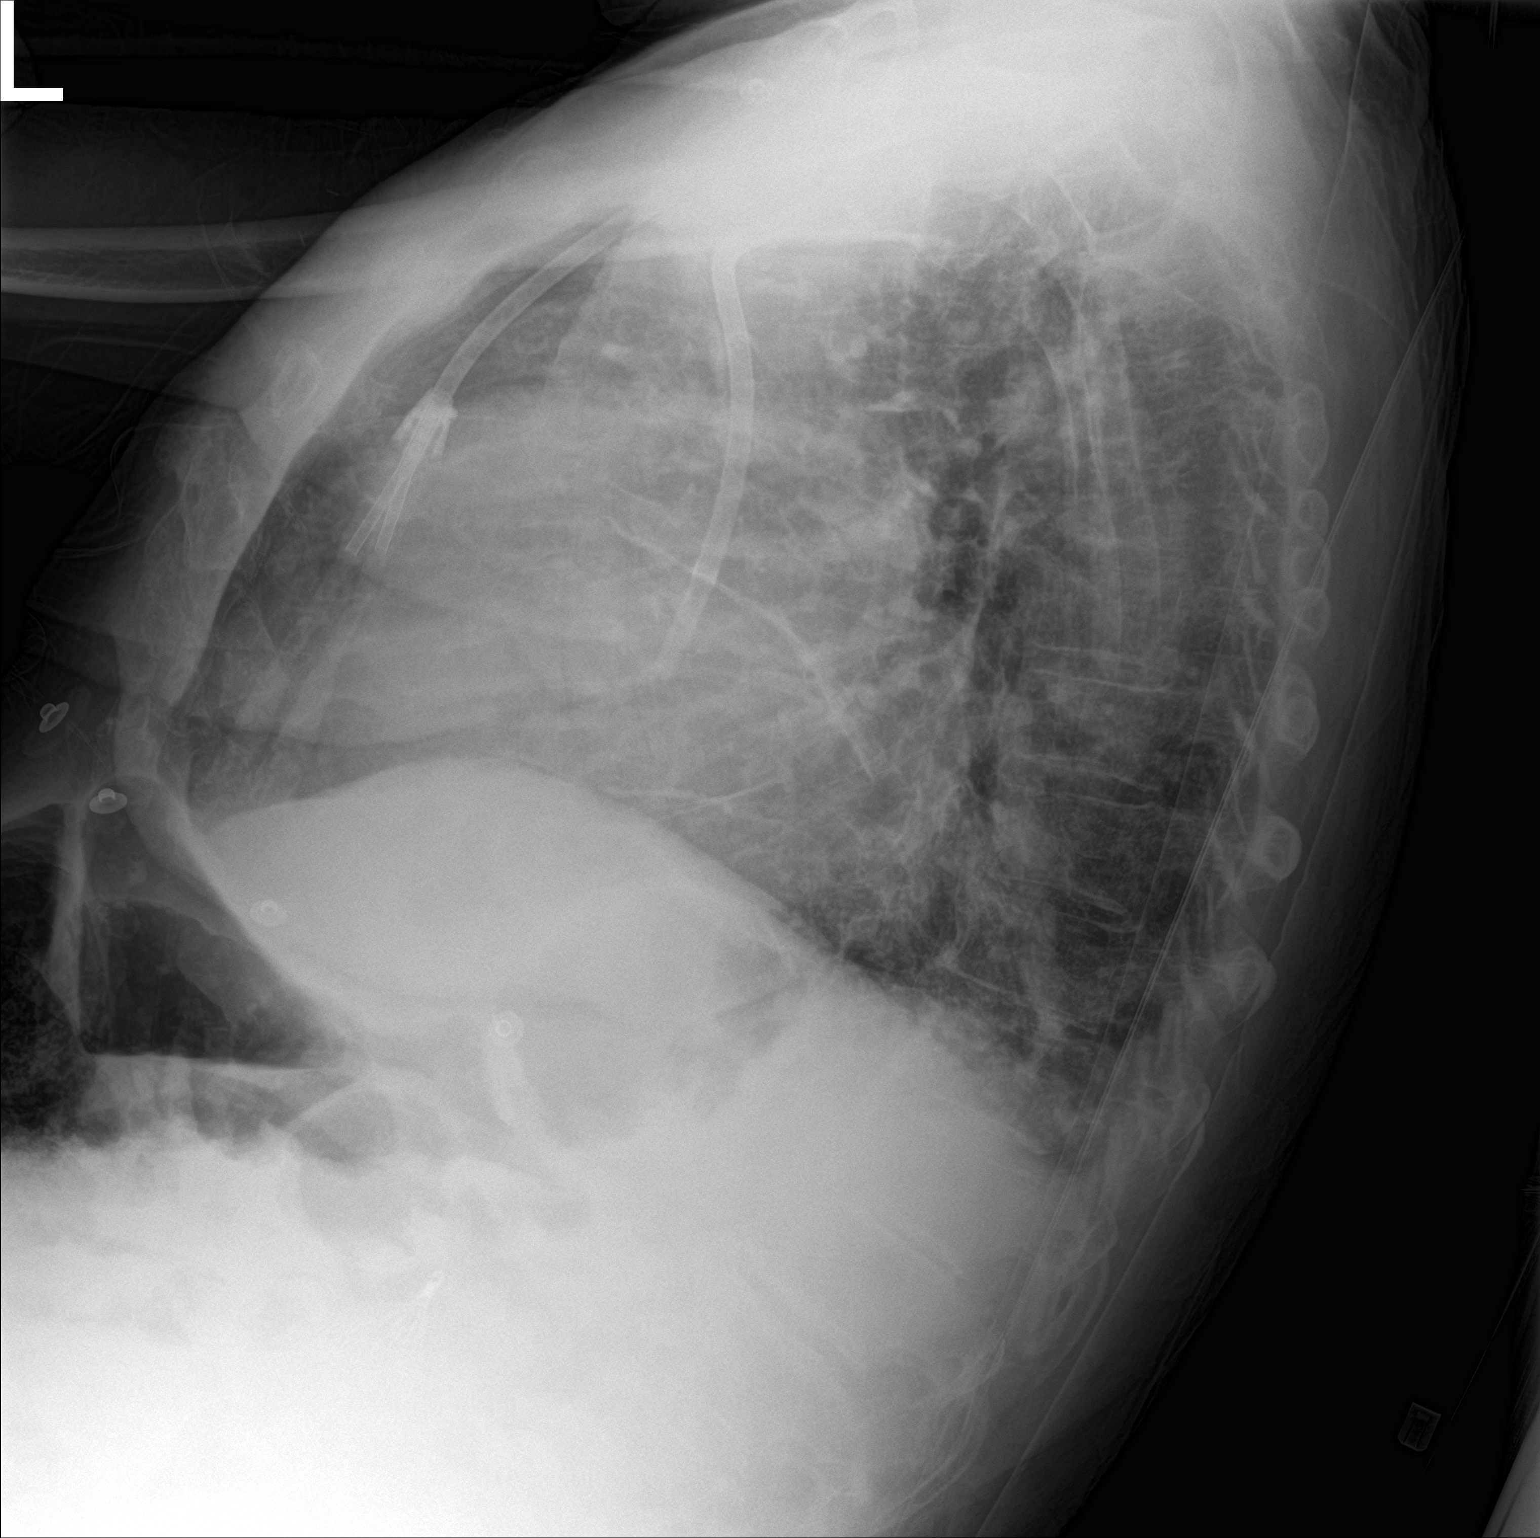

[chest ap]
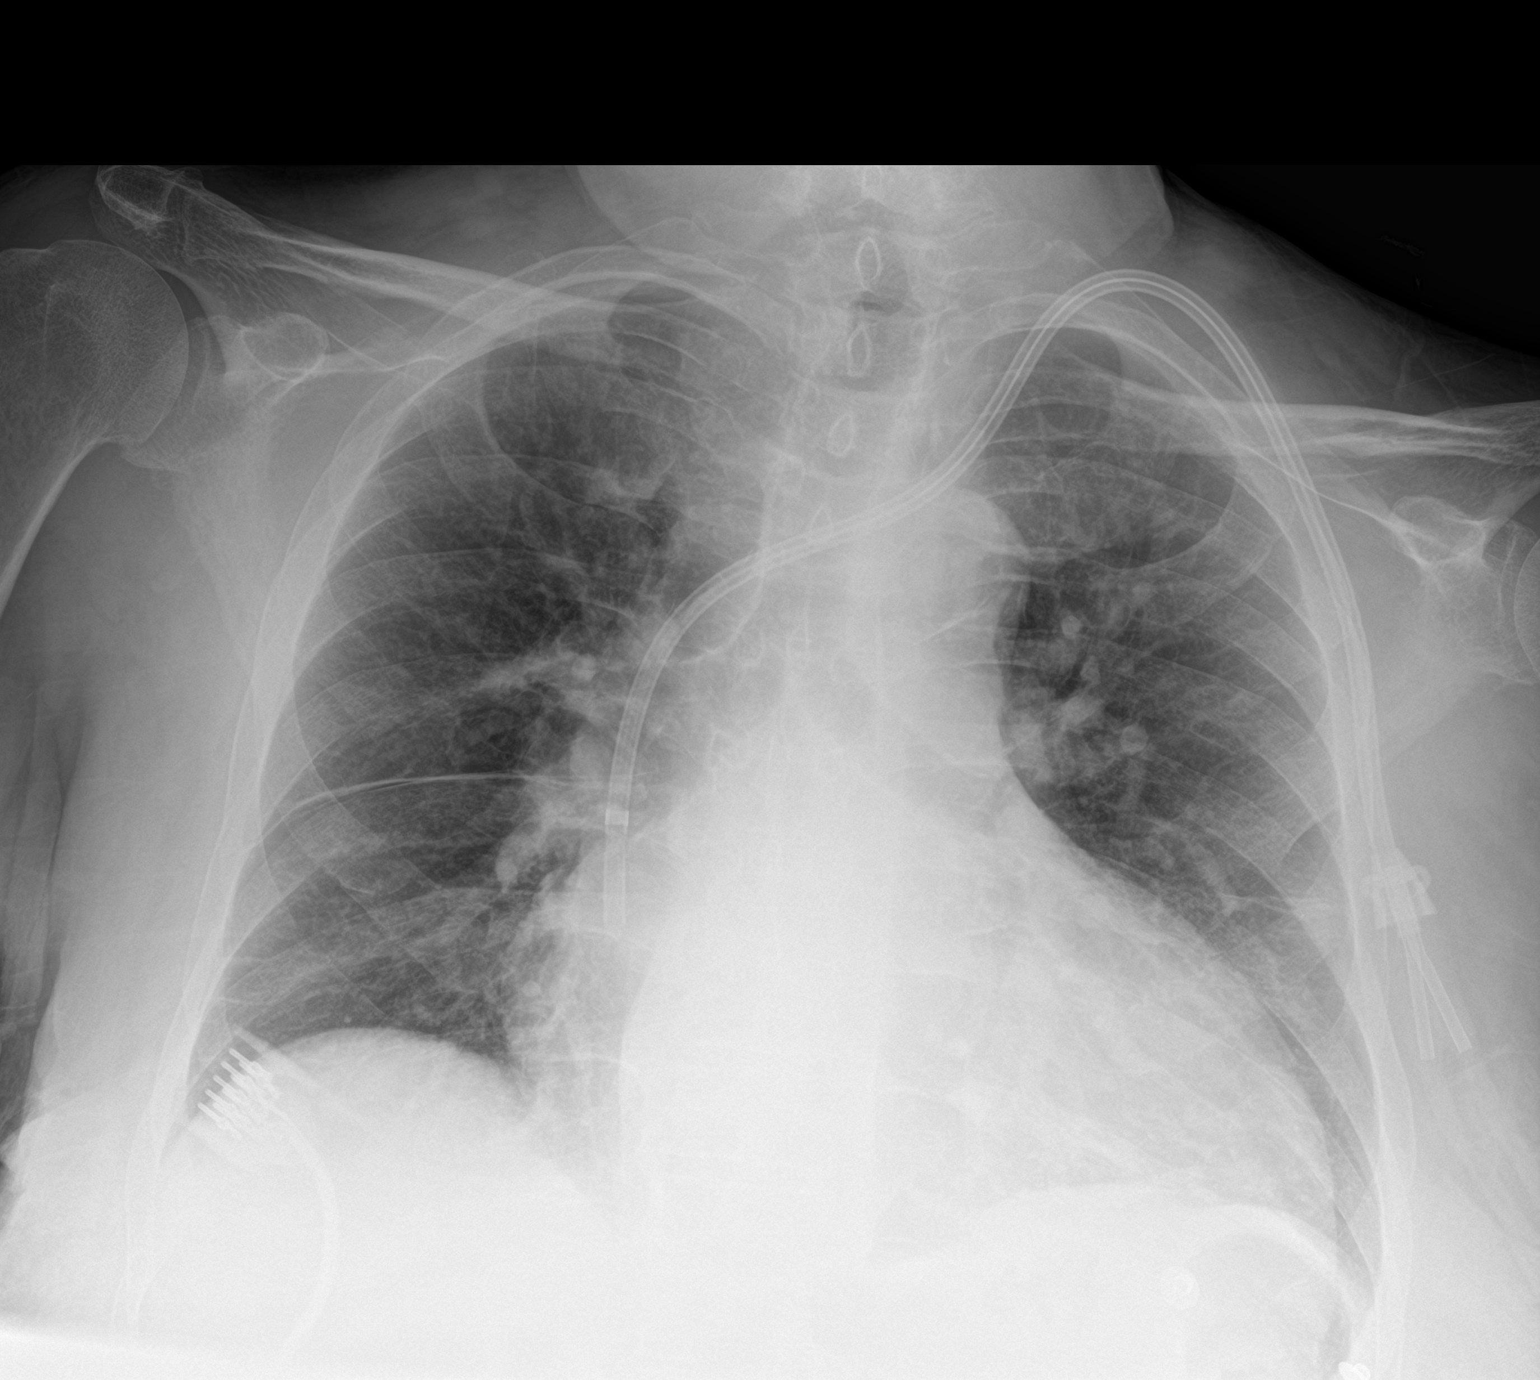

[2 of 2 positions shown; findings below may reference images not displayed]

FINDINGS: Left IJ central venous catheter unchanged with tip over the
cavoatrial junction. Lungs are adequately inflated without focal
airspace consolidation or effusion. Moderate stable cardiomegaly.
Remainder of the exam is unchanged.
IMPRESSION: No acute cardiopulmonary disease.

Moderate stable cardiomegaly.

Left IJ dialysis catheter unchanged.

## 2018-11-23 IMAGING — CT CT HEAD W/O CM
4 series · 15 of 47 positions shown, 17 images · non-contrast
Comparison: 01/18/2018

CLINICAL DATA: Altered level of consciousness

EXAM:
CT HEAD WITHOUT CONTRAST
TECHNIQUE: Contiguous axial images were obtained from the base of the skull
through the vertex without intravenous contrast.

[Series 3: head without · axial · non-contrast · 0.47mm/px · z∈[-34,+86]mm · 7 of 33 slices shown, 9 images]
[im 5/33  brain]
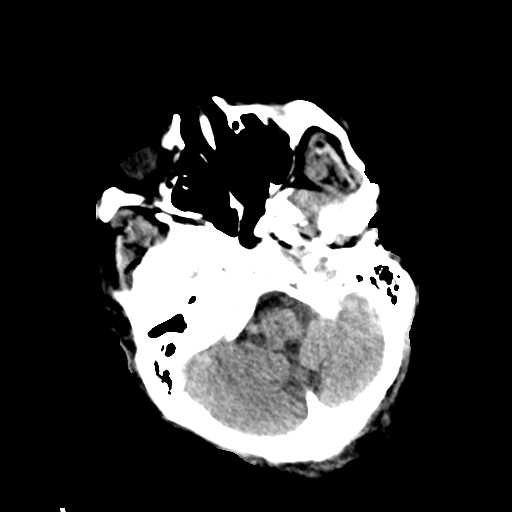
[im 5/33  bone]
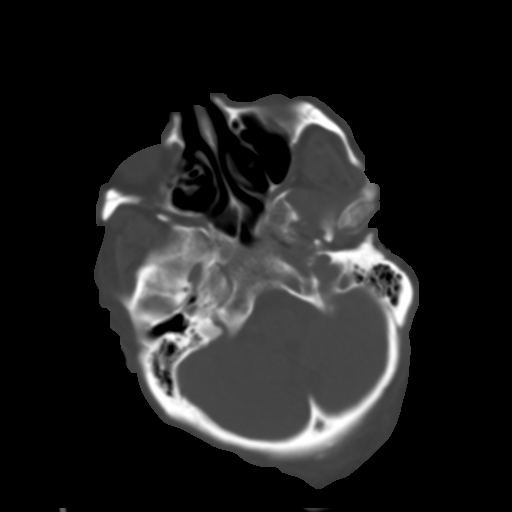
[im 9/33  brain]
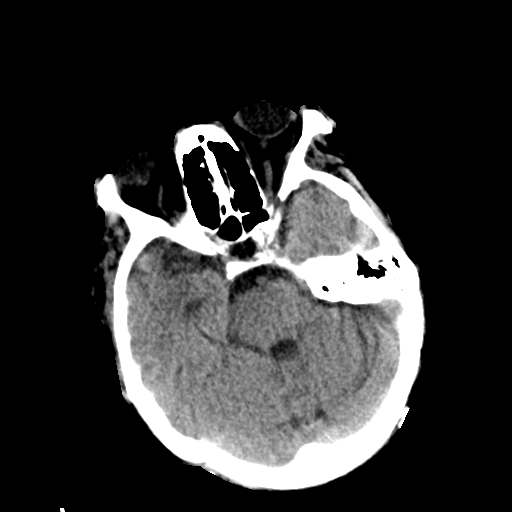
[im 13/33  brain]
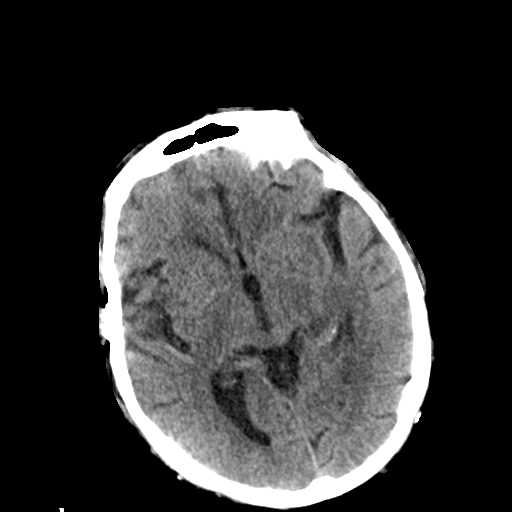
[im 17/33  brain]
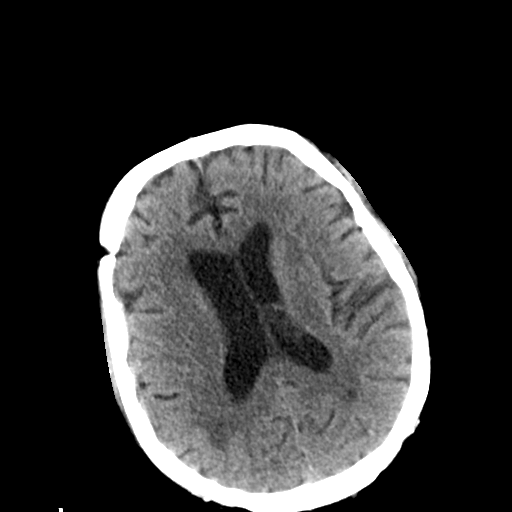
[im 21/33  brain]
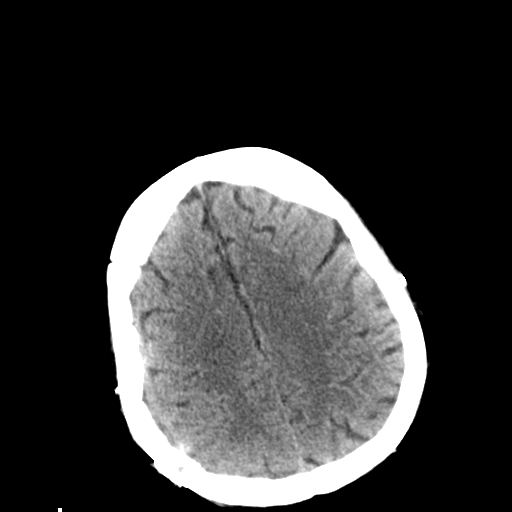
[im 21/33  bone]
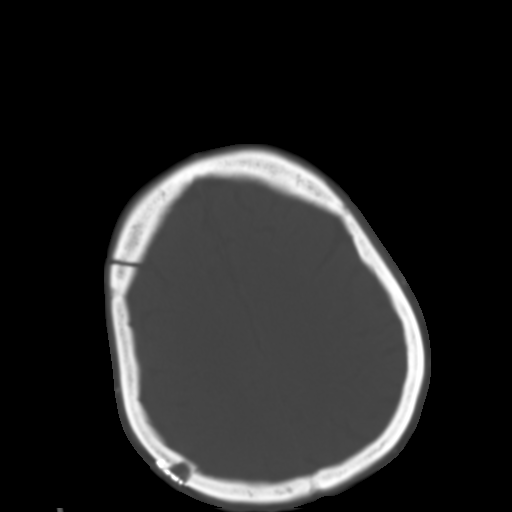
[im 25/33  brain]
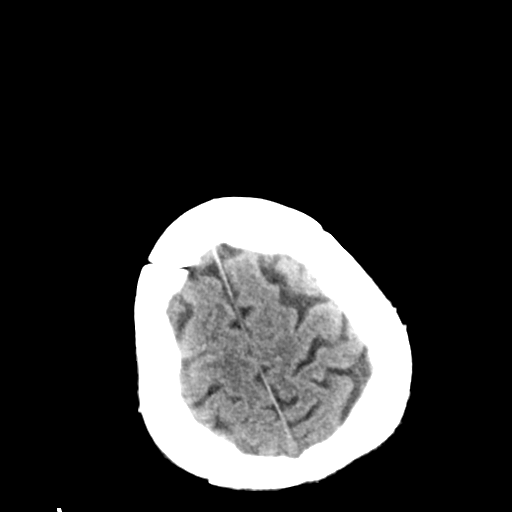
[im 29/33  brain]
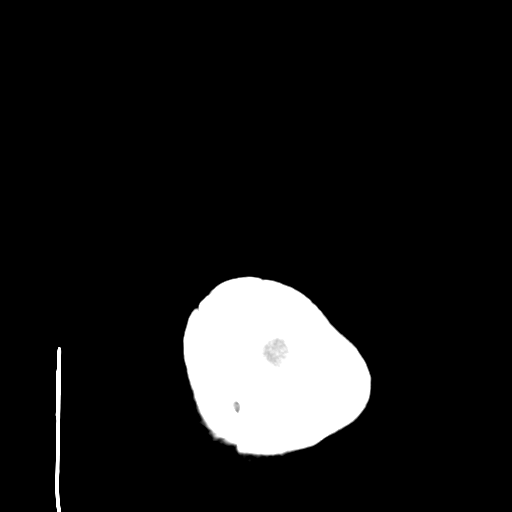

[Series 4: head bone · axial · 0.47mm/px · z∈[-42,-26]mm · 2 of 82 slices shown]
[im 9/82  bone]
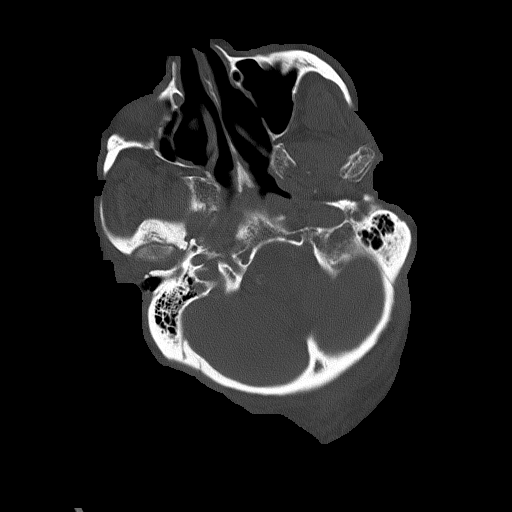
[im 17/82  bone]
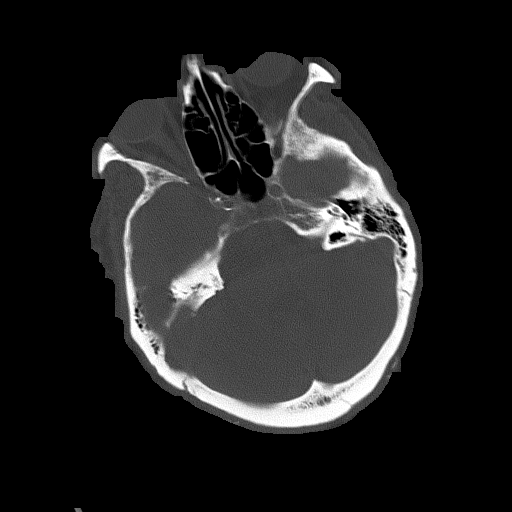

[Series 5: head without cor · coronal · non-contrast · 0.33mm/px · 3 of 79 slices shown]
[im 27/79  brain]
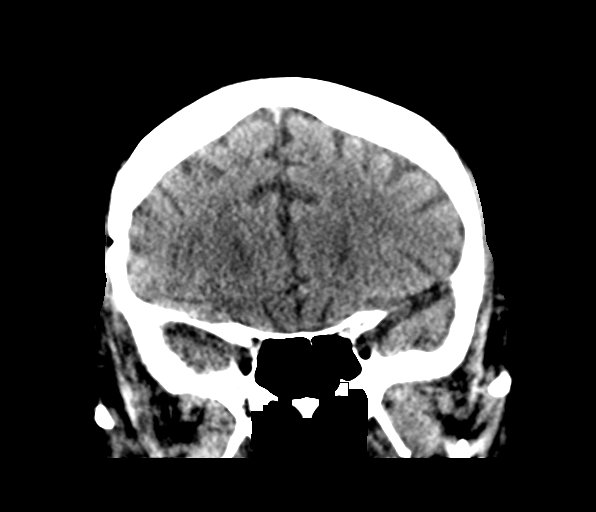
[im 35/79  brain]
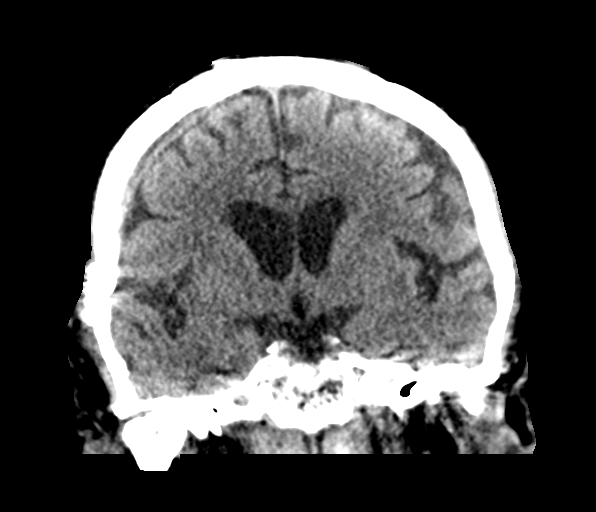
[im 44/79  brain]
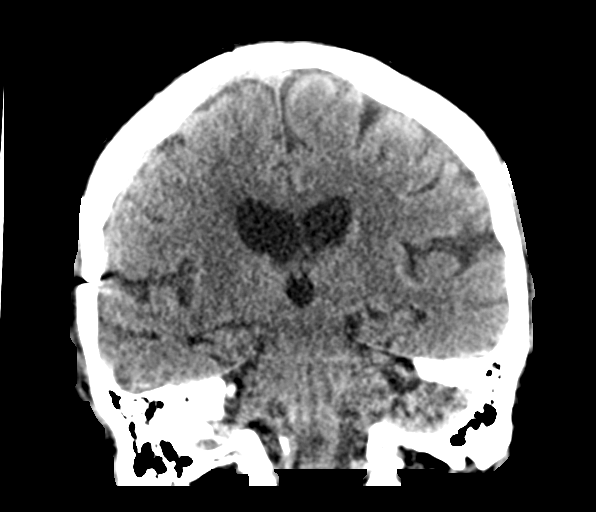

[Series 6: head without sag · sagittal · non-contrast · 0.32mm/px · 3 of 63 slices shown]
[im 21/63  brain]
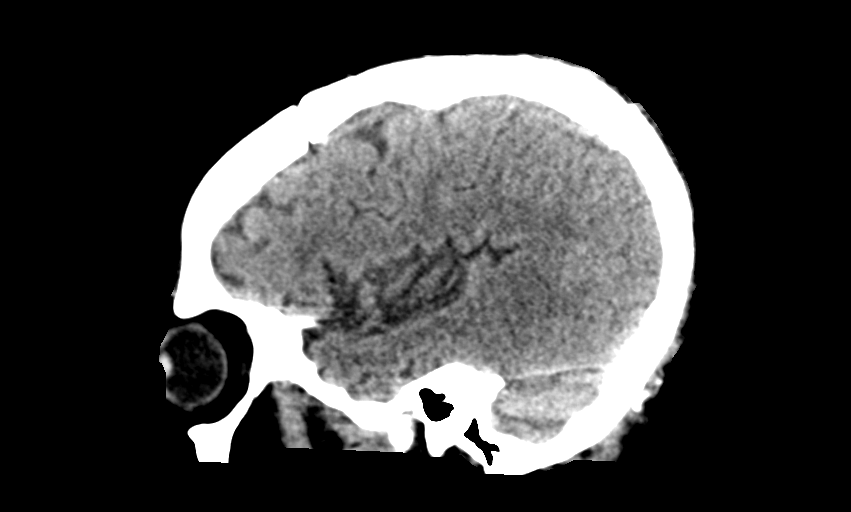
[im 32/63  brain]
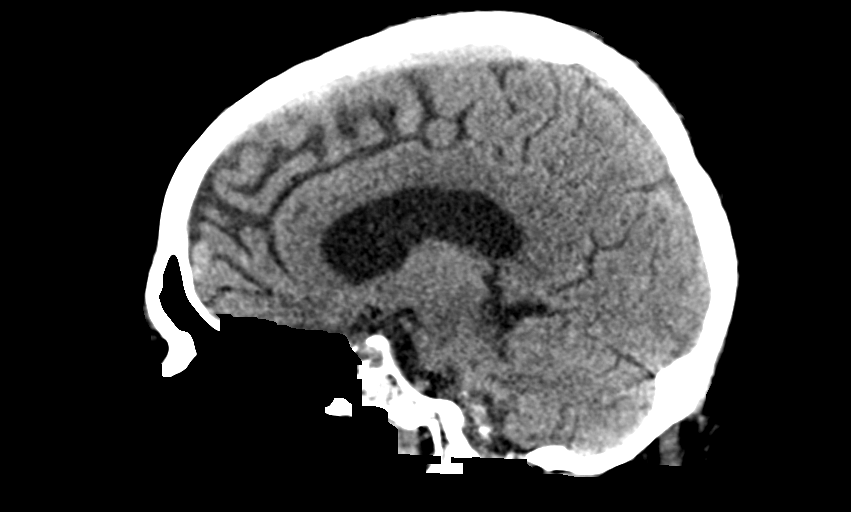
[im 42/63  brain]
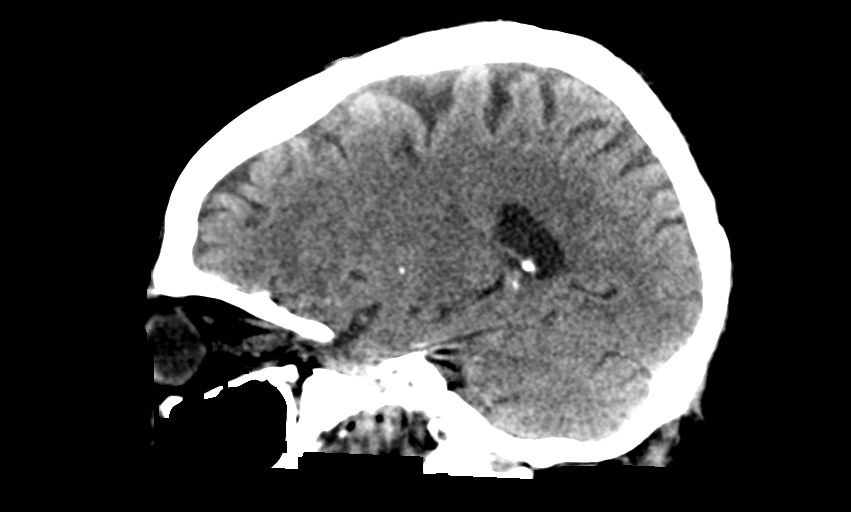

[15 of 47 positions shown; findings below may reference images not displayed]

FINDINGS: Brain: No evidence of acute infarction, hemorrhage, extra-axial
collection, ventriculomegaly, or mass effect. Generalized cerebral
atrophy. Periventricular white matter low attenuation likely
secondary to microangiopathy.

Vascular: Cerebrovascular atherosclerotic calcifications are noted.

Skull: Prior right frontoparietal craniotomy.

Sinuses/Orbits: Visualized portions of the orbits are unremarkable.
Visualized portions of the paranasal sinuses and mastoid air cells
are unremarkable.

Other: None.
IMPRESSION: No acute intracranial pathology.
